# Patient Record
Sex: Female | Born: 1955 | Race: Black or African American | Hispanic: No | Marital: Single | Smoking: Current every day smoker
Health system: Southern US, Community
[De-identification: ages and names within clinical notes are randomized; demographics above are authoritative.]

## PROBLEM LIST (undated history)

## (undated) ENCOUNTER — Emergency Department (HOSPITAL_COMMUNITY): Admission: EM | Payer: Self-pay | Source: Home / Self Care

## (undated) DIAGNOSIS — G4700 Insomnia, unspecified: Secondary | ICD-10-CM

## (undated) DIAGNOSIS — I5032 Chronic diastolic (congestive) heart failure: Secondary | ICD-10-CM

## (undated) DIAGNOSIS — F1410 Cocaine abuse, uncomplicated: Secondary | ICD-10-CM

## (undated) DIAGNOSIS — I639 Cerebral infarction, unspecified: Secondary | ICD-10-CM

## (undated) DIAGNOSIS — I10 Essential (primary) hypertension: Secondary | ICD-10-CM

## (undated) DIAGNOSIS — R0602 Shortness of breath: Secondary | ICD-10-CM

## (undated) DIAGNOSIS — I82621 Acute embolism and thrombosis of deep veins of right upper extremity: Secondary | ICD-10-CM

---

## 1997-11-12 ENCOUNTER — Encounter: Admission: RE | Admit: 1997-11-12 | Discharge: 1997-11-12 | Payer: Self-pay | Admitting: Family Medicine

## 1997-11-26 ENCOUNTER — Encounter: Admission: RE | Admit: 1997-11-26 | Discharge: 1997-11-26 | Payer: Self-pay | Admitting: Family Medicine

## 1997-12-11 ENCOUNTER — Encounter: Admission: RE | Admit: 1997-12-11 | Discharge: 1997-12-11 | Payer: Self-pay | Admitting: Family Medicine

## 1998-01-18 ENCOUNTER — Encounter: Admission: RE | Admit: 1998-01-18 | Discharge: 1998-01-18 | Payer: Self-pay | Admitting: Family Medicine

## 1998-02-03 ENCOUNTER — Encounter: Admission: RE | Admit: 1998-02-03 | Discharge: 1998-02-03 | Payer: Self-pay | Admitting: Family Medicine

## 1998-02-09 ENCOUNTER — Encounter: Admission: RE | Admit: 1998-02-09 | Discharge: 1998-02-09 | Payer: Self-pay | Admitting: Family Medicine

## 1998-03-12 ENCOUNTER — Encounter: Admission: RE | Admit: 1998-03-12 | Discharge: 1998-03-12 | Payer: Self-pay | Admitting: Family Medicine

## 1998-03-19 ENCOUNTER — Encounter: Admission: RE | Admit: 1998-03-19 | Discharge: 1998-03-19 | Payer: Self-pay | Admitting: Family Medicine

## 1998-03-31 ENCOUNTER — Encounter: Admission: RE | Admit: 1998-03-31 | Discharge: 1998-03-31 | Payer: Self-pay | Admitting: Family Medicine

## 1998-05-13 ENCOUNTER — Encounter: Admission: RE | Admit: 1998-05-13 | Discharge: 1998-05-13 | Payer: Self-pay | Admitting: Family Medicine

## 1998-05-26 ENCOUNTER — Encounter: Admission: RE | Admit: 1998-05-26 | Discharge: 1998-05-26 | Payer: Self-pay | Admitting: Family Medicine

## 1998-06-09 ENCOUNTER — Encounter: Admission: RE | Admit: 1998-06-09 | Discharge: 1998-06-09 | Payer: Self-pay | Admitting: Family Medicine

## 1998-08-13 ENCOUNTER — Encounter: Admission: RE | Admit: 1998-08-13 | Discharge: 1998-08-13 | Payer: Self-pay | Admitting: Family Medicine

## 1998-08-23 ENCOUNTER — Inpatient Hospital Stay (HOSPITAL_COMMUNITY): Admission: EM | Admit: 1998-08-23 | Discharge: 1998-08-25 | Payer: Self-pay | Admitting: Emergency Medicine

## 1998-08-23 ENCOUNTER — Encounter: Payer: Self-pay | Admitting: Emergency Medicine

## 1998-09-10 ENCOUNTER — Encounter: Admission: RE | Admit: 1998-09-10 | Discharge: 1998-09-10 | Payer: Self-pay | Admitting: Family Medicine

## 1998-09-17 ENCOUNTER — Encounter: Admission: RE | Admit: 1998-09-17 | Discharge: 1998-09-17 | Payer: Self-pay | Admitting: Family Medicine

## 1998-10-05 ENCOUNTER — Encounter: Admission: RE | Admit: 1998-10-05 | Discharge: 1998-10-05 | Payer: Self-pay | Admitting: Family Medicine

## 1998-12-29 ENCOUNTER — Encounter: Admission: RE | Admit: 1998-12-29 | Discharge: 1998-12-29 | Payer: Self-pay | Admitting: Family Medicine

## 1999-01-19 ENCOUNTER — Encounter: Admission: RE | Admit: 1999-01-19 | Discharge: 1999-01-19 | Payer: Self-pay | Admitting: Family Medicine

## 1999-02-07 ENCOUNTER — Encounter: Admission: RE | Admit: 1999-02-07 | Discharge: 1999-02-07 | Payer: Self-pay | Admitting: Family Medicine

## 1999-02-14 ENCOUNTER — Encounter: Admission: RE | Admit: 1999-02-14 | Discharge: 1999-02-14 | Payer: Self-pay | Admitting: Family Medicine

## 1999-02-26 ENCOUNTER — Emergency Department (HOSPITAL_COMMUNITY): Admission: EM | Admit: 1999-02-26 | Discharge: 1999-02-26 | Payer: Self-pay | Admitting: Emergency Medicine

## 1999-04-15 ENCOUNTER — Encounter: Admission: RE | Admit: 1999-04-15 | Discharge: 1999-04-15 | Payer: Self-pay | Admitting: Family Medicine

## 1999-04-27 ENCOUNTER — Encounter: Admission: RE | Admit: 1999-04-27 | Discharge: 1999-04-27 | Payer: Self-pay | Admitting: Family Medicine

## 1999-10-28 ENCOUNTER — Inpatient Hospital Stay (HOSPITAL_COMMUNITY): Admission: EM | Admit: 1999-10-28 | Discharge: 1999-11-01 | Payer: Self-pay | Admitting: Emergency Medicine

## 1999-10-29 ENCOUNTER — Encounter: Payer: Self-pay | Admitting: Internal Medicine

## 1999-11-01 ENCOUNTER — Encounter: Admission: RE | Admit: 1999-11-01 | Discharge: 1999-11-01 | Payer: Self-pay | Admitting: Obstetrics & Gynecology

## 2000-02-02 ENCOUNTER — Encounter: Payer: Self-pay | Admitting: Emergency Medicine

## 2000-02-02 ENCOUNTER — Emergency Department (HOSPITAL_COMMUNITY): Admission: EM | Admit: 2000-02-02 | Discharge: 2000-02-02 | Payer: Self-pay | Admitting: Emergency Medicine

## 2000-05-03 ENCOUNTER — Encounter: Admission: RE | Admit: 2000-05-03 | Discharge: 2000-05-03 | Payer: Self-pay

## 2000-05-11 ENCOUNTER — Ambulatory Visit (HOSPITAL_COMMUNITY): Admission: RE | Admit: 2000-05-11 | Discharge: 2000-05-11 | Payer: Self-pay | Admitting: Internal Medicine

## 2000-05-11 ENCOUNTER — Encounter: Admission: RE | Admit: 2000-05-11 | Discharge: 2000-05-11 | Payer: Self-pay | Admitting: Internal Medicine

## 2000-05-11 ENCOUNTER — Encounter: Payer: Self-pay | Admitting: Internal Medicine

## 2000-08-15 ENCOUNTER — Emergency Department (HOSPITAL_COMMUNITY): Admission: EM | Admit: 2000-08-15 | Discharge: 2000-08-15 | Payer: Self-pay | Admitting: Emergency Medicine

## 2000-08-20 ENCOUNTER — Emergency Department (HOSPITAL_COMMUNITY): Admission: EM | Admit: 2000-08-20 | Discharge: 2000-08-20 | Payer: Self-pay | Admitting: Emergency Medicine

## 2000-08-22 ENCOUNTER — Encounter: Admission: RE | Admit: 2000-08-22 | Discharge: 2000-08-22 | Payer: Self-pay

## 2001-06-27 ENCOUNTER — Encounter: Admission: RE | Admit: 2001-06-27 | Discharge: 2001-06-27 | Payer: Self-pay | Admitting: Internal Medicine

## 2001-08-19 ENCOUNTER — Encounter: Admission: RE | Admit: 2001-08-19 | Discharge: 2001-08-19 | Payer: Self-pay | Admitting: Internal Medicine

## 2001-12-09 ENCOUNTER — Encounter: Admission: RE | Admit: 2001-12-09 | Discharge: 2001-12-09 | Payer: Self-pay | Admitting: Internal Medicine

## 2001-12-10 ENCOUNTER — Encounter: Admission: RE | Admit: 2001-12-10 | Discharge: 2001-12-10 | Payer: Self-pay | Admitting: Internal Medicine

## 2002-01-27 ENCOUNTER — Encounter: Admission: RE | Admit: 2002-01-27 | Discharge: 2002-01-27 | Payer: Self-pay | Admitting: Internal Medicine

## 2002-03-20 ENCOUNTER — Encounter: Admission: RE | Admit: 2002-03-20 | Discharge: 2002-03-20 | Payer: Self-pay | Admitting: Internal Medicine

## 2002-11-25 ENCOUNTER — Encounter: Admission: RE | Admit: 2002-11-25 | Discharge: 2002-11-25 | Payer: Self-pay | Admitting: Internal Medicine

## 2004-01-04 ENCOUNTER — Emergency Department (HOSPITAL_COMMUNITY): Admission: EM | Admit: 2004-01-04 | Discharge: 2004-01-04 | Payer: Self-pay | Admitting: Emergency Medicine

## 2004-01-14 ENCOUNTER — Encounter: Admission: RE | Admit: 2004-01-14 | Discharge: 2004-01-14 | Payer: Self-pay | Admitting: Internal Medicine

## 2004-08-09 ENCOUNTER — Emergency Department (HOSPITAL_COMMUNITY): Admission: EM | Admit: 2004-08-09 | Discharge: 2004-08-09 | Payer: Self-pay

## 2005-01-24 ENCOUNTER — Ambulatory Visit: Payer: Self-pay | Admitting: Internal Medicine

## 2005-01-25 ENCOUNTER — Encounter (INDEPENDENT_AMBULATORY_CARE_PROVIDER_SITE_OTHER): Payer: Self-pay | Admitting: *Deleted

## 2005-07-10 HISTORY — PX: ANKLE FRACTURE SURGERY: SHX122

## 2005-11-23 ENCOUNTER — Ambulatory Visit: Payer: Self-pay | Admitting: Internal Medicine

## 2005-11-23 ENCOUNTER — Encounter (INDEPENDENT_AMBULATORY_CARE_PROVIDER_SITE_OTHER): Payer: Self-pay | Admitting: *Deleted

## 2006-01-06 ENCOUNTER — Emergency Department (HOSPITAL_COMMUNITY): Admission: EM | Admit: 2006-01-06 | Discharge: 2006-01-06 | Payer: Self-pay | Admitting: Emergency Medicine

## 2006-01-28 ENCOUNTER — Emergency Department (HOSPITAL_COMMUNITY): Admission: EM | Admit: 2006-01-28 | Discharge: 2006-01-28 | Payer: Self-pay | Admitting: Emergency Medicine

## 2006-02-01 ENCOUNTER — Ambulatory Visit (HOSPITAL_COMMUNITY): Admission: RE | Admit: 2006-02-01 | Discharge: 2006-02-01 | Payer: Self-pay | Admitting: Orthopedic Surgery

## 2006-04-26 ENCOUNTER — Encounter (INDEPENDENT_AMBULATORY_CARE_PROVIDER_SITE_OTHER): Payer: Self-pay | Admitting: *Deleted

## 2006-04-26 DIAGNOSIS — F329 Major depressive disorder, single episode, unspecified: Secondary | ICD-10-CM

## 2006-04-26 DIAGNOSIS — I10 Essential (primary) hypertension: Secondary | ICD-10-CM

## 2006-04-26 DIAGNOSIS — M545 Low back pain: Secondary | ICD-10-CM | POA: Insufficient documentation

## 2006-04-26 DIAGNOSIS — F17200 Nicotine dependence, unspecified, uncomplicated: Secondary | ICD-10-CM | POA: Insufficient documentation

## 2006-04-26 DIAGNOSIS — K219 Gastro-esophageal reflux disease without esophagitis: Secondary | ICD-10-CM

## 2006-04-26 DIAGNOSIS — E119 Type 2 diabetes mellitus without complications: Secondary | ICD-10-CM | POA: Insufficient documentation

## 2006-04-26 DIAGNOSIS — F1010 Alcohol abuse, uncomplicated: Secondary | ICD-10-CM

## 2006-07-14 DIAGNOSIS — E669 Obesity, unspecified: Secondary | ICD-10-CM

## 2006-07-14 DIAGNOSIS — K029 Dental caries, unspecified: Secondary | ICD-10-CM | POA: Insufficient documentation

## 2006-10-27 ENCOUNTER — Inpatient Hospital Stay (HOSPITAL_COMMUNITY): Admission: EM | Admit: 2006-10-27 | Discharge: 2006-10-30 | Payer: Self-pay | Admitting: Emergency Medicine

## 2006-10-27 ENCOUNTER — Ambulatory Visit: Payer: Self-pay | Admitting: Internal Medicine

## 2007-02-11 ENCOUNTER — Emergency Department (HOSPITAL_COMMUNITY): Admission: EM | Admit: 2007-02-11 | Discharge: 2007-02-11 | Payer: Self-pay | Admitting: Emergency Medicine

## 2007-02-14 ENCOUNTER — Ambulatory Visit: Payer: Self-pay | Admitting: Infectious Disease

## 2007-04-02 ENCOUNTER — Ambulatory Visit: Payer: Self-pay | Admitting: Hospitalist

## 2007-04-02 LAB — CONVERTED CEMR LAB
Blood Glucose, Fingerstick: 73
Blood Glucose, Fingerstick: 93
Blood Glucose, Home Monitor: 2 mg/dL

## 2007-04-16 ENCOUNTER — Encounter: Payer: Self-pay | Admitting: Licensed Clinical Social Worker

## 2007-07-09 ENCOUNTER — Telehealth (INDEPENDENT_AMBULATORY_CARE_PROVIDER_SITE_OTHER): Payer: Self-pay | Admitting: Pharmacy Technician

## 2007-07-15 ENCOUNTER — Encounter (INDEPENDENT_AMBULATORY_CARE_PROVIDER_SITE_OTHER): Payer: Self-pay | Admitting: Internal Medicine

## 2007-07-15 ENCOUNTER — Ambulatory Visit: Payer: Self-pay | Admitting: Internal Medicine

## 2007-07-15 ENCOUNTER — Ambulatory Visit (HOSPITAL_COMMUNITY): Admission: RE | Admit: 2007-07-15 | Discharge: 2007-07-15 | Payer: Self-pay | Admitting: Internal Medicine

## 2007-07-15 LAB — CONVERTED CEMR LAB
Protein, U semiquant: 100
Specific Gravity, Urine: >=1.03

## 2007-07-16 LAB — CONVERTED CEMR LAB
LDL Cholesterol: 78 mg/dL (ref 0–99)
Potassium: 4.5 meq/L (ref 3.5–5.3)
Sodium: 140 meq/L (ref 135–145)
Total CHOL/HDL Ratio: 3.5
Triglycerides: 114 mg/dL (ref <150)

## 2007-07-17 ENCOUNTER — Telehealth: Payer: Self-pay | Admitting: *Deleted

## 2007-10-14 ENCOUNTER — Other Ambulatory Visit: Payer: Self-pay | Admitting: Emergency Medicine

## 2007-10-14 ENCOUNTER — Ambulatory Visit: Payer: Self-pay | Admitting: Infectious Diseases

## 2007-10-14 ENCOUNTER — Inpatient Hospital Stay (HOSPITAL_COMMUNITY): Admission: AD | Admit: 2007-10-14 | Discharge: 2007-10-20 | Payer: Self-pay | Admitting: Infectious Diseases

## 2007-10-18 ENCOUNTER — Encounter (INDEPENDENT_AMBULATORY_CARE_PROVIDER_SITE_OTHER): Payer: Self-pay | Admitting: Surgery

## 2007-10-21 ENCOUNTER — Encounter (INDEPENDENT_AMBULATORY_CARE_PROVIDER_SITE_OTHER): Payer: Self-pay | Admitting: Internal Medicine

## 2007-11-01 ENCOUNTER — Encounter (INDEPENDENT_AMBULATORY_CARE_PROVIDER_SITE_OTHER): Payer: Self-pay | Admitting: *Deleted

## 2007-11-01 ENCOUNTER — Ambulatory Visit: Payer: Self-pay | Admitting: Infectious Disease

## 2007-11-01 DIAGNOSIS — F1410 Cocaine abuse, uncomplicated: Secondary | ICD-10-CM | POA: Insufficient documentation

## 2007-11-03 LAB — CONVERTED CEMR LAB
Amphetamine Screen, Ur: NEGATIVE
BUN: 10 mg/dL (ref 6–23)
Bacteria, UA: NONE SEEN
Barbiturate Quant, Ur: NEGATIVE
CO2: 23 meq/L (ref 19–32)
Calcium: 9.3 mg/dL (ref 8.4–10.5)
Chloride: 103 meq/L (ref 96–112)
Creatinine, Ser: 0.79 mg/dL (ref 0.40–1.20)
Leukocytes, UA: NEGATIVE
Marijuana Metabolite: NEGATIVE
Opiates: NEGATIVE
Potassium: 4.6 meq/L (ref 3.5–5.3)
Propoxyphene: NEGATIVE
Protein, ur: NEGATIVE mg/dL
RBC / HPF: NONE SEEN (ref <3)
Sodium: 137 meq/L (ref 135–145)
Specific Gravity, Urine: 1.034 — ABNORMAL HIGH (ref 1.005–1.03)
Urine Glucose: >1000 mg/dL — AB
Urobilinogen, UA: 0.2 (ref 0.0–1.0)
WBC, UA: NONE SEEN cells/hpf (ref <3)
pH: 5.5 (ref 5.0–8.0)

## 2007-11-14 ENCOUNTER — Telehealth (INDEPENDENT_AMBULATORY_CARE_PROVIDER_SITE_OTHER): Payer: Self-pay | Admitting: *Deleted

## 2008-03-19 ENCOUNTER — Ambulatory Visit: Payer: Self-pay | Admitting: Infectious Diseases

## 2008-03-19 ENCOUNTER — Inpatient Hospital Stay (HOSPITAL_COMMUNITY): Admission: EM | Admit: 2008-03-19 | Discharge: 2008-03-24 | Payer: Self-pay | Admitting: Emergency Medicine

## 2008-03-26 ENCOUNTER — Telehealth: Payer: Self-pay | Admitting: *Deleted

## 2008-04-07 ENCOUNTER — Encounter (INDEPENDENT_AMBULATORY_CARE_PROVIDER_SITE_OTHER): Payer: Self-pay | Admitting: *Deleted

## 2008-04-17 ENCOUNTER — Ambulatory Visit: Payer: Self-pay | Admitting: Infectious Disease

## 2008-04-17 DIAGNOSIS — A4902 Methicillin resistant Staphylococcus aureus infection, unspecified site: Secondary | ICD-10-CM

## 2008-04-17 DIAGNOSIS — R221 Localized swelling, mass and lump, neck: Secondary | ICD-10-CM

## 2008-04-17 DIAGNOSIS — R220 Localized swelling, mass and lump, head: Secondary | ICD-10-CM | POA: Insufficient documentation

## 2008-06-21 ENCOUNTER — Inpatient Hospital Stay (HOSPITAL_COMMUNITY): Admission: EM | Admit: 2008-06-21 | Discharge: 2008-06-23 | Payer: Self-pay | Admitting: Emergency Medicine

## 2008-06-21 ENCOUNTER — Ambulatory Visit: Payer: Self-pay | Admitting: Internal Medicine

## 2008-07-08 ENCOUNTER — Encounter: Payer: Self-pay | Admitting: *Deleted

## 2008-07-13 ENCOUNTER — Ambulatory Visit: Payer: Self-pay | Admitting: Internal Medicine

## 2008-07-13 DIAGNOSIS — G4700 Insomnia, unspecified: Secondary | ICD-10-CM

## 2008-07-13 LAB — CONVERTED CEMR LAB: Hgb A1c MFr Bld: 10.2 %

## 2009-01-21 ENCOUNTER — Telehealth: Payer: Self-pay | Admitting: *Deleted

## 2009-01-21 ENCOUNTER — Telehealth (INDEPENDENT_AMBULATORY_CARE_PROVIDER_SITE_OTHER): Payer: Self-pay | Admitting: *Deleted

## 2009-02-11 ENCOUNTER — Encounter: Payer: Self-pay | Admitting: Internal Medicine

## 2009-02-11 ENCOUNTER — Ambulatory Visit: Payer: Self-pay | Admitting: Internal Medicine

## 2009-02-11 LAB — CONVERTED CEMR LAB
Blood Glucose, Fingerstick: 474
Hgb A1c MFr Bld: 11 %

## 2009-02-12 ENCOUNTER — Encounter: Payer: Self-pay | Admitting: Internal Medicine

## 2009-02-12 LAB — CONVERTED CEMR LAB
Chloride: 98 meq/L (ref 96–112)
Creatinine, Ser: 0.84 mg/dL (ref 0.40–1.20)
Creatinine, Urine: 43.9 mg/dL
Glucose, Bld: 448 mg/dL — ABNORMAL HIGH (ref 70–99)
MCV: 91.6 fL (ref >=78.0)
Microalb Creat Ratio: 21.4 mg/g (ref 0.0–30.0)
Potassium: 4.6 meq/L (ref 3.5–5.3)
RDW: 14 % (ref 11.5–15.5)

## 2009-02-23 ENCOUNTER — Telehealth (INDEPENDENT_AMBULATORY_CARE_PROVIDER_SITE_OTHER): Payer: Self-pay | Admitting: *Deleted

## 2009-02-24 ENCOUNTER — Telehealth: Payer: Self-pay | Admitting: Licensed Clinical Social Worker

## 2009-02-25 ENCOUNTER — Encounter: Payer: Self-pay | Admitting: Licensed Clinical Social Worker

## 2009-02-25 ENCOUNTER — Ambulatory Visit: Payer: Self-pay | Admitting: Internal Medicine

## 2009-02-25 ENCOUNTER — Telehealth: Payer: Self-pay | Admitting: Internal Medicine

## 2009-02-25 LAB — CONVERTED CEMR LAB: Blood Glucose, Home Monitor: 4 mg/dL

## 2009-03-09 ENCOUNTER — Emergency Department (HOSPITAL_COMMUNITY): Admission: EM | Admit: 2009-03-09 | Discharge: 2009-03-09 | Payer: Self-pay | Admitting: Emergency Medicine

## 2009-03-23 ENCOUNTER — Emergency Department (HOSPITAL_COMMUNITY): Admission: EM | Admit: 2009-03-23 | Discharge: 2009-03-23 | Payer: Self-pay | Admitting: Emergency Medicine

## 2009-04-05 ENCOUNTER — Emergency Department (HOSPITAL_COMMUNITY): Admission: EM | Admit: 2009-04-05 | Discharge: 2009-04-05 | Payer: Self-pay | Admitting: Emergency Medicine

## 2009-04-23 ENCOUNTER — Ambulatory Visit: Payer: Self-pay | Admitting: Gastroenterology

## 2009-07-04 ENCOUNTER — Encounter (INDEPENDENT_AMBULATORY_CARE_PROVIDER_SITE_OTHER): Payer: Self-pay | Admitting: Internal Medicine

## 2009-07-05 ENCOUNTER — Observation Stay (HOSPITAL_COMMUNITY): Admission: EM | Admit: 2009-07-05 | Discharge: 2009-07-05 | Payer: Self-pay | Admitting: Emergency Medicine

## 2009-07-05 ENCOUNTER — Encounter: Payer: Self-pay | Admitting: Internal Medicine

## 2009-07-26 ENCOUNTER — Emergency Department (HOSPITAL_COMMUNITY): Admission: EM | Admit: 2009-07-26 | Discharge: 2009-07-26 | Payer: Self-pay | Admitting: Emergency Medicine

## 2009-08-31 ENCOUNTER — Ambulatory Visit: Payer: Self-pay | Admitting: Internal Medicine

## 2009-08-31 DIAGNOSIS — L02419 Cutaneous abscess of limb, unspecified: Secondary | ICD-10-CM

## 2009-08-31 DIAGNOSIS — L03119 Cellulitis of unspecified part of limb: Secondary | ICD-10-CM

## 2009-08-31 LAB — CONVERTED CEMR LAB
BUN: 7 mg/dL
Chloride: 107 meq/L
LDL Cholesterol: 84 mg/dL (ref 0–99)
Total CHOL/HDL Ratio: 3
Triglycerides: 98 mg/dL (ref <150)
VLDL: 20 mg/dL (ref 0–40)

## 2009-10-01 ENCOUNTER — Ambulatory Visit: Payer: Self-pay | Admitting: Internal Medicine

## 2009-10-01 ENCOUNTER — Inpatient Hospital Stay (HOSPITAL_COMMUNITY): Admission: RE | Admit: 2009-10-01 | Discharge: 2009-10-05 | Payer: Self-pay | Admitting: Internal Medicine

## 2009-10-01 ENCOUNTER — Encounter (INDEPENDENT_AMBULATORY_CARE_PROVIDER_SITE_OTHER): Payer: Self-pay | Admitting: Internal Medicine

## 2009-10-01 ENCOUNTER — Encounter (INDEPENDENT_AMBULATORY_CARE_PROVIDER_SITE_OTHER): Payer: Self-pay | Admitting: Dermatology

## 2009-10-01 DIAGNOSIS — R079 Chest pain, unspecified: Secondary | ICD-10-CM

## 2009-10-04 ENCOUNTER — Encounter (INDEPENDENT_AMBULATORY_CARE_PROVIDER_SITE_OTHER): Payer: Self-pay | Admitting: Dermatology

## 2009-10-04 ENCOUNTER — Encounter: Payer: Self-pay | Admitting: Internal Medicine

## 2009-10-07 ENCOUNTER — Telehealth: Payer: Self-pay | Admitting: Internal Medicine

## 2010-01-28 ENCOUNTER — Telehealth (INDEPENDENT_AMBULATORY_CARE_PROVIDER_SITE_OTHER): Payer: Self-pay | Admitting: *Deleted

## 2010-04-04 ENCOUNTER — Observation Stay (HOSPITAL_COMMUNITY): Admission: EM | Admit: 2010-04-04 | Discharge: 2010-04-04 | Payer: Self-pay | Admitting: Emergency Medicine

## 2010-07-31 ENCOUNTER — Encounter: Payer: Self-pay | Admitting: Internal Medicine

## 2010-08-09 NOTE — Progress Notes (Signed)
  Phone Note Call from Patient   Caller: Patient Details for Reason: Patient call complaining of feeling shaking and with low CBG. Summary of Call: Patient is having an episode of low blood sugar after using her insuline at night. Patient reports she didn't use the whole 35 units as prescribed, because her CBG was just 159 (she only used 20 instead). couple hours later she dropped to 52, noticing and feeling the hypoglyemic event. Patient was instructed to have sweet tea and also some peanut butter and her CBG came back up to 148. Patient was asymptomatic and appointment to be seen in the morning was arranged. Patient needs to keep glucose tablets at home and her insulin adjusted if needed. Patient was advised to call 911 and go to Er in case she experienced any further symptoms or complaints, in order to have proper evaluation and treatment.

## 2010-08-09 NOTE — Miscellaneous (Signed)
Summary: Admission H&P  INTERNAL MEDICINE ADMISSION HISTORY AND PHYSICAL  PCP: Melida Quitter, MD  CC: chest pain  HPI: 55 yo woman with HTN, DMII, tobacco abuse, cocaine abuse who presents with chest pain. She says that for the last few weeks she has had a "tightness" in her chest that comes and goes. It lasts less than 1 minute at a time. It happens at rest and there is nothing in particular that causes the pain, but she thinks it is worse with smoking. It radiates from the center of her chest to under her left breast. It also radiates into the left shoulder. She has a history of reflux, but she thinks that this pain feels different and is located higher anatomically. The pain happens mostly in the afternoon or evening. It is 8-9/10 at maximum intensity. It happens every other day.  She reports that she did have some nausea on the bus today but that it has resolved. She also said that her heart was "fluttering" earlier today but that it has since resolved. She denies vomiting, syncope, shortness of breath.  She does endorse much stress recently as her fiance is hospitalized at the moment after having heart surgery.  She also says that for a week or so she has had some sputum production with flecks of blood in her sputum. She has had no recent illness [URI, PNA, etc]. This has never happened before the last week or so.  The patient also endorses crack cocaine use, most recently the night before last. She says that she does have her episodes of chest pain when she is not using cocaine and that they do not seem to be associated.   She also endorses some lower back pain that precedes her other symptoms and is more chronic. ALLERGIES: NKDA  PAST MEDICAL HISTORY:  DEPRESSION DIABETES MELLITUS, type II    - poorly controlled GERD HYPERTENSION Low back pain Dental caries Tobacco abuse, 4-5 cig/day Obesity ALCOHOL ABUSE    - s/p pancreatitis 06/2008  COCAINE ABUSE    - UDS + 9/09 Hx of R  Bartholin's gland cyst Hx of left thigh/labial abscess with MRSA   MEDICATIONS:  LISINOPRIL 20 MG TABS (LISINOPRIL) Take 1 tablet by mouth once a day ZOLPIDEM TARTRATE 5 MG TABS (ZOLPIDEM TARTRATE) Take 1 tablet by mouth at bedtime PRODIGY BLOOD GLUCOSE TEST  STRP (GLUCOSE BLOOD) use to test blood sugar 2-4x daily before meals and bedtime [BMN] PEN NEEDLES 5/16" 31G X 8 MM MISC (INSULIN PEN NEEDLE) use to inject insulin twice daily PRODIGY TWIST TOP LANCETS 28G  MISC (LANCETS) use to test yor blood sugar 2-4x daily before meals and bedtime [BMN] NOVOLOG MIX 70/30 FLEXPEN 70-30 % SUSP (INSULIN ASPART PROT & ASPART) Inject 45 units subcutaneously every morning and 35 units subcutaneously every evening. [BMN] GLUCOTROL 10 MG TABS (GLIPIZIDE) Take 1 tablet by mouth once a day METFORMIN HCL 500 MG TABS (METFORMIN HCL) Take 1 tablet by mouth two times a day DOXYCYCLINE HYCLATE 100 MG CAPS (DOXYCYCLINE HYCLATE) Take 1 tablet by mouth two times a day for 14 days. ULTRAM 50 MG TABS (TRAMADOL HCL) Take 1 tab every 4-6 hours as needed for pain.   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  Insurance: Medicaid  FAMILY HISTORY Family History: Family History of Alcoholism/Addiction Family History Diabetes 1st degree relative Family History Hypertension Family History of Stroke M 1st degree relative <50  Father died of Stroke Brother had MI at age 55, still alive    ROS: General: Denies fever, chills, weight loss/gain, fatigue, recent travel, sick contacts, diaphoresis, night sweats HEENT: Denies earache, nosebleed, sore  throat CV: Denies  dyspnea, orthopnea, PND, edema, syncope. Endorses chest pain, palpitations as per HPI Resp: Denies dyspnea, DOE. See HPI. GI: Denies vomiting, diarrhea, constipation, hematemesis, hematochezia, melena. Had nausea earlier today that has since resolved. GU: Denies dysuria, urinary frequency, flank pain, hematuria, urinary incontinence, nocturia, urethral discharge, vaginal discharge Derm: Denies rash, hair loss Endocrine: Denies heat intolerance, cold intolerance, polyuria, polydipsia Heme/Lymph: Denies bruising, lymphadenopathy MS: Endorses back pain as per HPI. Neuro/Eyes: Denies weakness, numbness, tremor, headache, diplopia, blurry vision, vision change, hearing loss, dizziness, tinnitus, imbalance, vertigo, neck stiffness Psych: Denies depression, suicidal ideations, homicidal ideations, hallucinations. She does have some anxiety as per HPI.   VITALS: T:  98.4 P:81  BP:142/82  R:  20 O2SAT:96%  ON: RA PHYSICAL EXAM: General:  alert, well-developed, and cooperative to examination.   Head:  normocephalic and atraumatic.   Eyes:  vision grossly intact, pupils equal, pupils round, pupils reactive to light, no injection and anicteric.   Mouth:  pharynx pink and moist, no erythema, and no exudates.   Neck:  supple, full ROM, no thyromegaly, no JVD, and no carotid bruits.   Lungs:  normal respiratory effort, no accessory muscle use, normal breath sounds, no crackles, and no wheezes.  Heart:  normal rate, regular rhythm, no murmur, no gallop, and no rub.   Abdomen:  soft, non-tender, normal bowel sounds, no distention, no guarding, no rebound tenderness, no hepatomegaly, and no splenomegaly.   Msk:  no joint swelling, no joint warmth, and no redness over joints.   Extremities:  No cyanosis, clubbing, edema  Neurologic:  alert & oriented X3, cranial nerves II-XII intact, strength normal in all extremities, sensation intact to light touch, and gait normal.   Skin:  turgor  normal and no rashes.   Psych:  Oriented X3, memory intact for recent and remote, normally interactive, good eye contact, not anxious appearing, and not depressed appearing.   LABS:   D-Dimer, Fibrin Derivatives              0.29              0.00-0.48        ug/mL-FEU   Sodium (NA)                              138               135-145          mEq/L  Potassium (K)                            3.9               3.5-5.1          mEq/L  Chloride  106               96-112           mEq/L  CO2                                      29                19-32            mEq/L  Glucose                                  306        h      70-99            mg/dL  BUN                                      15                6-23             mg/dL  Creatinine                               0.89              0.4-1.2          mg/dL  GFR, Est Non African American            >60               >60              mL/min  GFR, Est African American                >60               >60              mL/min    Oversized comment, see footnote  1  Calcium                                  8.9               8.4-10.5         mg/dL   Troponin I                               <0.01             0.00-0.06        ng/mL   Amphetamins                              SEE NOTE.         NDT    NONE DETECTED  Barbiturates                             SEE NOTE.         NDT    Oversized comment, see  footnote  1  Benzodiazepines                          SEE NOTE.         NDT    NONE DETECTED  Cocaine                                  POSITIVE   a      NDT  Opiates                                  SEE NOTE.         NDT    NONE DETECTED  Tetrahydrocannabinol                     SEE NOTE.         NDT    NONE DETECTED  ASSESSMENT AND PLAN:  Chest Pain: Risk factors for ACS incl: DM, HTN, smoking, subst abuse. Recent h/o hemoptysis is concerning, which gives her a Wells score of 1. D-dimer in clinic was negative.  Given her significant risk factors she needs to come in to rule out ACS and possible further cardiac eval given off and on visit to the hospital for the same complaint.  Will cycle enzymes, EKGs, monitor on tele. Will give daily ASA. Nitro as needed.  DMII: WIll place on SSI sensitive  and scheduled 4U Novolog with meals as well as Lantus 30U daily.  Hemoptysis: This patient says that she has had hemoptysis for a week or so when coughing. She says that she has not really been sick and has not had hemoptysis before. This is concerning. Her chest xray was negative. She may need CT to further eval this.   Tobacco Abuse: Patient was counseled about the advserse health risks of smoking such as heart disease, lung disease, stroke, and cancer. Patient was informed that the clinic is willing and able to assist the patient in smoking cessation through counseling, medications, or smoking cessation aids such as nicotine patches or gums. The patient was encouraged  to contact the clinic anytime smoking cessation help is desired.   Cocaine Abuse: I personally counseled this patient about the adverse health effects of cocaine abuse. She was also counseled in the outpatient clinic and will be continually counseled at each visit. She voices understanding and a desire to quit.  ETOH Abuse: I personally counseled this patient about the adverse health effects of ETOH abuse. She was not aware that drinking beer was the same as drinking liquor. She was educated that these are the same in terms of health risk. I will place her on thiamine, folate, CIWA observation for now.   HTN: Has not been taking meds. Will place on Lisinopril and monitor closely.   Hx Insomnia: Ambien as needed.   VTE PROPH: Heparin.

## 2010-08-09 NOTE — Progress Notes (Signed)
Summary: due to follow up/dmr  Phone Note Outgoing Call   Call placed by: Jamison Neighbor RD,CDE,  January 28, 2010 2:24 PM Summary of Call: called to patient as she is due to see doctor and CDE- she asked if it can be the first week in August, then we were disconnected. Will ask scheduler to return call to schedule for doctor.   Follow-up for Phone Call        Called patient for several days to sch an appointment.  Phone number is disconnected or just stays busy all the time.  If and when the patient calls back an appointment will be made. Follow-up by: Shon Hough,  February 09, 2010 11:43 AM  Additional Follow-up for Phone Call Additional follow up Details #1::        Thank you. Letter mailed today requesting patient call to schedule appointment. Additional Follow-up by: Jamison Neighbor RD,CDE,  February 09, 2010 1:52 PM

## 2010-08-09 NOTE — Miscellaneous (Signed)
Summary: Hospital Discharge Instructions  Hospital Discharge  Date of admission: 10-01-2009  Date of discharge: 10-05-2009  Brief reason for admission/active problems: 55 yo woman admitted with chest pain, ruled out for MI with negative enzymes, EKGs. Had stress test that was negative for any major ischemia. Discharged home after ruleout complete to followup in clinic.   Followup needed: [facility], 10-15-2009 3:30 PM, Dr. Doreen Beam  At this appointment please assess symptoms of chest pain and also assess for ongoing tobacco, cocaine, and ETOH use. No specific labs are needed.   The medication and problem lists have been updated.  Please see the dictated discharge summary for details.   Current Meds:  LISINOPRIL 20 MG TABS (LISINOPRIL) Take 1 tablet by mouth once a day ZOLPIDEM TARTRATE 5 MG TABS (ZOLPIDEM TARTRATE) Take 1 tablet by mouth at bedtime PRODIGY BLOOD GLUCOSE TEST  STRP (GLUCOSE BLOOD) use to test blood sugar 2-4x daily before meals and bedtime [BMN] PEN NEEDLES 5/16" 31G X 8 MM MISC (INSULIN PEN NEEDLE) use to inject insulin twice daily PRODIGY TWIST TOP LANCETS 28G  MISC (LANCETS) use to test yor blood sugar 2-4x daily before meals and bedtime [BMN] NOVOLOG MIX 70/30 FLEXPEN 70-30 % SUSP (INSULIN ASPART PROT & ASPART) Inject 45 units subcutaneously every morning and 35 units subcutaneously every evening. [BMN] GLUCOTROL 10 MG TABS (GLIPIZIDE) Take 1 tablet by mouth once a day METFORMIN HCL 500 MG TABS (METFORMIN HCL) Take 1 tablet by mouth two times a day ULTRAM 50 MG TABS (TRAMADOL HCL) Take 1 tab every 4-6 hours as needed for pain.

## 2010-08-09 NOTE — Assessment & Plan Note (Signed)
Summary: EST-CK/FU/MEDS/CFB   Vital Signs:  Patient profile:   55 year old female Height:      69 inches (175.26 cm) Weight:      219.2 pounds (99.64 kg) BMI:     32.49 Temp:     97.0 degrees F oral Pulse rate:   80 / minute BP sitting:   150 / 86  (right arm)  Vitals Entered By: Chinita Pester RN (August 31, 2009 9:37 AM) CC: Check-up. Med.refills. Right foot toenail needs to bechecked. Sore on left leg . Scalp abscess is healing. Is Patient Diabetic? Yes Did you bring your meter with you today? No Pain Assessment Patient in pain? yes     Location: lower back Intensity: 7 Type: sharp Onset of pain  Intermittent Nutritional Status BMI of > 30 = obese CBG Result 295  Have you ever been in a relationship where you felt threatened, hurt or afraid?No   Does patient need assistance? Functional Status Self care Ambulation Normal   Primary Care Provider:  Olene Craven MD  CC:  Check-up. Med.refills. Right foot toenail needs to bechecked. Sore on left leg . Scalp abscess is healing.Marland Kitchen  History of Present Illness: Ms. Killough is a 55 y/o woman with DM, HTN and alcohol abuse who presents to the opc for general check up and medication refills.  1) DM Type II - Pt reports she is not taking her insulin or glipizide as prescribed because she has been spending a lot of time at her sister-in-law's house while her husband has been in the hospital. She reports her husband recently had a MI and was hospitalized and since her sister-in-law lives near the hospital, she has been staying there and thus forgets to take her medications with her.   2) HTN - Reports she has been out of medications and does not take them everyday due to recent stress of her husband's condition. She denies headache, chest pain, shortness of breath, edema, or cough.   3) Lesion - over left calf. First noticed it 1 week ago. It started as a small bump. It has gotten bigger since then, now tender, draining  yellow/white puss. She reports it is healing now.     Depression History:      The patient denies a depressed mood most of the day and a diminished interest in her usual daily activities.         Preventive Screening-Counseling & Management  Alcohol-Tobacco     Alcohol drinks/day: every other day     Alcohol type: beer     Smoking Status: current     Smoking Cessation Counseling: no     Packs/Day: 1-2  cigs per day  Caffeine-Diet-Exercise     Does Patient Exercise: yes     Type of exercise: WALKING     Times/week: 5-7  -  Date:  08/31/2009    BUN: 7    Creatinine: 0.65    Sodium: 139    Potassium: 4.6    Chloride: 107    CO2 Total: 27  Current Medications (verified): 1)  Lisinopril 20 Mg Tabs (Lisinopril) .... Take 1 Tablet By Mouth Once A Day 2)  Zolpidem Tartrate 5 Mg Tabs (Zolpidem Tartrate) .... Take 1 Tablet By Mouth At Bedtime 3)  Prodigy Blood Glucose Test  Strp (Glucose Blood) .... Use To Test Blood Sugar 2-4x Daily Before Meals and Bedtime 4)  Pen Needles 5/16" 31g X 8 Mm Misc (Insulin Pen Needle) .... Use To Inject Insulin Twice  Daily 5)  Prodigy Twist Top Lancets 28g  Misc (Lancets) .... Use To Test Yor Blood Sugar 2-4x Daily Before Meals and Bedtime 6)  Novolog Mix 70/30 Flexpen 70-30 % Susp (Insulin Aspart Prot & Aspart) .... Inject 45 Units Subcutaneously Every Morning and 35 Units Subcutaneously Every Evening. 7)  Glucotrol 10 Mg Tabs (Glipizide) .... Take 1 Tablet By Mouth Once A Day  Allergies (verified): No Known Drug Allergies  Past History:  Past Medical History: Last updated: 07/13/2008 DEPRESSION DIABETES MELLITUS, type II    - poorly controlled GERD HYPERTENSION Low back pain Dental caries Tobacco abuse, 4-5 cig/day Obesity ALCOHOL ABUSE    - s/p pancreatitis 06/2008 COCAINE ABUSE    - UDS + 9/09 Hx of R Bartholin's gland cyst Hx of left thigh/labial abscess with MRSA  Past Surgical History: Last updated: 02/14/2007 Incision and  drainage of R Bartholin's gland cyst 10/1999 Trimalleolar fx (L ankle) s/p repair  Family History: Last updated: 02/11/2009 Family History of Alcoholism/Addiction Family History Diabetes 1st degree relative Family History Hypertension Family History of Stroke M 1st degree relative <50  Social History: Last updated: 02/11/2009 Pt's children live in [place] Pt lives in [place] husband  Current Smoker - 3-4 cig per day Alcohol use-yes drinks 2 beers per day  Risk Factors: Alcohol Use: every other day (08/31/2009) Exercise: yes (08/31/2009)  Risk Factors: Smoking Status: current (08/31/2009) Packs/Day: 1-2  cigs per day (08/31/2009)  Social History: Packs/Day:  1-2  cigs per day  Review of Systems General:  Denies fatigue, fever, and weakness. CV:  Complains of chest pain or discomfort; denies difficulty breathing at night, difficulty breathing while lying down, shortness of breath with exertion, and swelling of feet. Resp:  Complains of cough and sputum productive. GI:  Denies abdominal pain, change in bowel habits, nausea, and vomiting. GU:  Denies urinary frequency. Endo:  Complains of excessive thirst.  Physical Exam  General:  alert and well-developed.   Head:  normocephalic and atraumatic.   Eyes:  vision grossly intact, pupils equal, pupils round, and pupils reactive to light.   Neck:  supple, full ROM, and no masses.   Lungs:  normal respiratory effort, normal breath sounds, no crackles, and no wheezes.   Heart:  normal rate, regular rhythm, no murmur, no gallop, no rub, and no JVD.   Abdomen:  soft, non-tender, normal bowel sounds, no distention, and no masses.   Msk:  normal ROM.   Pulses:  R radial normal, R dorsalis pedis normal, L radial normal, and L dorsalis pedis normal.   Extremities:  no LE edema noted  Neurologic:  alert & oriented X3, cranial nerves II-XII intact, sensation intact to light touch, and sensation intact to pinprick.   Skin:  3-4 cm  lesion over left calf, draining clear fluid, tender on palpation, appears to be healing, no puss or blood from site   Diabetes Management Exam:    Foot Exam (with socks and/or shoes not present):       Sensory-Pinprick/Light touch:          Left medial foot (L-4): normal          Left dorsal foot (L-5): normal          Left lateral foot (S-1): normal          Right medial foot (L-4): normal          Right dorsal foot (L-5): normal          Right lateral foot (  S-1): normal       Sensory-Monofilament:          Left foot: normal          Right foot: normal       Inspection:          Left foot: normal          Right foot: normal       Nails:          Left foot: normal          Right foot: normal   Impression & Recommendations:  Problem # 1:  DIABETES MELLITUS, TYPE II (ICD-250.00) Assessment Deteriorated DM has deteriorated due to noncompliance with medications. Patient would benefit from both insulin and metformin to give both insulin and decrease gluconeogenesis and it appears that patient has not been on Metformin in the past. Will start Metformin 500 mg two times a day and check renal function at next office visit in 1 month.   Her updated medication list for this problem includes:    Lisinopril 20 Mg Tabs (Lisinopril) .Marland Kitchen... Take 1 tablet by mouth once a day    Novolog Mix 70/30 Flexpen 70-30 % Susp (Insulin aspart prot & aspart) ..... Inject 45 units subcutaneously every morning and 35 units subcutaneously every evening.    Glucotrol 10 Mg Tabs (Glipizide) .Marland Kitchen... Take 1 tablet by mouth once a day    Metformin Hcl 500 Mg Tabs (Metformin hcl) .Marland Kitchen... Take 1 tablet by mouth two times a day  Orders: T- Capillary Blood Glucose (16109) T-Hgb A1C (in-house) ([id number]) T-Lipid Profile [phone])  Labs Reviewed: Creat: 0.84 (02/12/2009)   Microalbumin: 0.92 (01/25/2005) Reviewed HgBA1c results: 12.5 (08/31/2009)  11.0 (02/11/2009)  Problem # 2:  HYPERTENSION  (ICD-401.9) Assessment: Deteriorated Blood pressure elevated due to medication non-compliance. Patient is well controlled when taking Lisinopril. Will restart lisinopril and have patient follow up in 1 month.   Her updated medication list for this problem includes:    Lisinopril 20 Mg Tabs (Lisinopril) .Marland Kitchen... Take 1 tablet by mouth once a day  BP today: 150/86 Prior BP: 123/73 (02/11/2009)  Labs Reviewed: K+: 4.6 (02/12/2009) Creat: : 0.84 (02/12/2009)   Chol: 141 (07/15/2007)   HDL: 40 (07/15/2007)   LDL: 70  --  06/21/08 (07/08/2008)   TG: 114 (07/15/2007)  Problem # 3:  CELLULITIS AND ABSCESS OF LEG EXCEPT FOOT (ICD-682.6) Assessment: New Lesion over left calf, is tender, however is no longer draining and looks like it is healing now, therefore will not lance. Given hx of MRSA and poorly controlled DM, will give patient a 2 week course of doxycycline and have patient follow up. Patient was advised that if the lesion gets worse, and develops fevers, etc then patient should return to clinic sooner.   Her updated medication list for this problem includes:    Doxycycline Hyclate 100 Mg Caps (Doxycycline hyclate) .Marland Kitchen... Take 1 tablet by mouth two times a day for 14 days.  Problem # 4:  LOW BACK PAIN (ICD-724.2) Assessment: Deteriorated No recent imaging studies. Suspect lower back pain is 2/2 degenerative changes. No fevers, chills, or indication of epidural abscess. Does not radiate and no neurological deficits. Will try ultram for pain management as patient reports Ibuprofen and Tyelenol do not help pain.   Her updated medication list for this problem includes:    Ultram 50 Mg Tabs (Tramadol hcl) .Marland Kitchen... Take 1 tab every 4-6 hours as needed for pain.  Problem # 5:  INSOMNIA (ICD-780.52)  Assessment: Comment Only Patient reports improved restful state with Ambien, thus will continue for now. Advised patient that her difficulty sleeping is likely due to her husband's medical condition and  stress. Also encouraged good sleep hygene.   Her updated medication list for this problem includes:    Zolpidem Tartrate 5 Mg Tabs (Zolpidem tartrate) .Marland Kitchen... Take 1 tablet by mouth at bedtime  Complete Medication List: 1)  Lisinopril 20 Mg Tabs (Lisinopril) .... Take 1 tablet by mouth once a day 2)  Zolpidem Tartrate 5 Mg Tabs (Zolpidem tartrate) .... Take 1 tablet by mouth at bedtime 3)  Prodigy Blood Glucose Test Strp (Glucose blood) .... Use to test blood sugar 2-4x daily before meals and bedtime 4)  Pen Needles 5/16" 31g X 8 Mm Misc (Insulin pen needle) .... Use to inject insulin twice daily 5)  Prodigy Twist Top Lancets 28g Misc (Lancets) .... Use to test yor blood sugar 2-4x daily before meals and bedtime 6)  Novolog Mix 70/30 Flexpen 70-30 % Susp (Insulin aspart prot & aspart) .... Inject 45 units subcutaneously every morning and 35 units subcutaneously every evening. 7)  Glucotrol 10 Mg Tabs (Glipizide) .... Take 1 tablet by mouth once a day 8)  Metformin Hcl 500 Mg Tabs (Metformin hcl) .... Take 1 tablet by mouth two times a day 9)  Doxycycline Hyclate 100 Mg Caps (Doxycycline hyclate) .... Take 1 tablet by mouth two times a day for 14 days. 10)  Ultram 50 Mg Tabs (Tramadol hcl) .... Take 1 tab every 4-6 hours as needed for pain.  Other Orders: Admin 1st Vaccine (16109) Flu Vaccine 24yrs + (60454)  Patient Instructions: 1)  Please schedule a follow-up appointment in 1 month. Prescriptions: ULTRAM 50 MG TABS (TRAMADOL HCL) Take 1 tab every 4-6 hours as needed for pain.  #60 x 0   Entered and Authorized by:   Melida Quitter MD   Signed by:   Melida Quitter MD on 08/31/2009   Method used:   Print then Give to Patient   RxID:   [id number] DOXYCYCLINE HYCLATE 100 MG CAPS (DOXYCYCLINE HYCLATE) Take 1 tablet by mouth two times a day for 14 days.  #28 x 0   Entered and Authorized by:   Melida Quitter MD   Signed by:   Melida Quitter MD on 08/31/2009   Method used:   Print then Give  to Patient   RxID:   [id number] METFORMIN HCL 500 MG TABS (METFORMIN HCL) Take 1 tablet by mouth two times a day  #60 x 6   Entered and Authorized by:   Melida Quitter MD   Signed by:   Melida Quitter MD on 08/31/2009   Method used:   Print then Give to Patient   RxID:   [id number] GLUCOTROL 10 MG TABS (GLIPIZIDE) Take 1 tablet by mouth once a day  #30 x 6   Entered and Authorized by:   Melida Quitter MD   Signed by:   Melida Quitter MD on 08/31/2009   Method used:   Print then Give to Patient   RxID:   [id number] NOVOLOG MIX 70/30 FLEXPEN 70-30 % SUSP (INSULIN ASPART PROT & ASPART) Inject 45 units subcutaneously every morning and 35 units subcutaneously every evening. Brand medically necessary #2 boxes x 5   Entered and Authorized by:   Melida Quitter MD   Signed by:   Melida Quitter MD on 08/31/2009   Method used:   Print then Give to Patient   RxID:  [id number] ZOLPIDEM TARTRATE 5 MG TABS (ZOLPIDEM TARTRATE) Take 1 tablet by mouth at bedtime  #30 x 0   Entered and Authorized by:   Melida Quitter MD   Signed by:   Melida Quitter MD on 08/31/2009   Method used:   Print then Give to Patient   RxID:   [id number] LISINOPRIL 20 MG TABS (LISINOPRIL) Take 1 tablet by mouth once a day  #30 x 11   Entered and Authorized by:   Melida Quitter MD   Signed by:   Melida Quitter MD on 08/31/2009   Method used:   Print then Give to Patient   RxID:   [id number]   Prevention & Chronic Care Immunizations   Influenza vaccine: Fluvax 3+  (08/31/2009)    Tetanus booster: Not documented   Td booster deferral: Refused  (02/11/2009)    Pneumococcal vaccine: Historical  (05/11/2000)  Colorectal Screening   Hemoccult: Not documented    Colonoscopy: Not documented   Colonoscopy action/deferral: GI referral  (02/11/2009)  Other Screening   Pap smear: Not documented   Pap smear action/deferral: Refused  (08/31/2009)    Mammogram: Not documented   Mammogram  action/deferral: Refused  (08/31/2009)   Smoking status: current  (08/31/2009)   Smoking cessation counseling: no  (08/31/2009)  Diabetes Mellitus   HgbA1C: 12.5  (08/31/2009)    Eye exam: Not documented    Foot exam: yes  (08/31/2009)   High risk foot: Yes  (04/02/2007)   Foot care education: Not documented    Urine microalbumin/creatinine ratio: 21.4  (02/12/2009)   Urine microalbumin action/deferral: Ordered    Diabetes flowsheet reviewed?: Yes   Progress toward A1C goal: Deteriorated  Lipids   Total Cholesterol: 141  (07/15/2007)   Lipid panel action/deferral: Lipid Panel ordered   LDL: 70  --  06/21/08  (07/08/2008)   LDL Direct: Not documented   HDL: 40  (07/15/2007)   Triglycerides: 114  (07/15/2007)  Hypertension   Last Blood Pressure: 150 / 86  (08/31/2009)   Serum creatinine: 0.65  (08/31/2009)   BMP action: Ordered   Serum potassium 4.6  (08/31/2009)    Hypertension flowsheet reviewed?: Yes   Progress toward BP goal: Deteriorated  Self-Management Support :   Personal Goals (by the next clinic visit) :     Personal A1C goal: 8  (08/31/2009)     Personal blood pressure goal: 130/80  (08/31/2009)     Personal LDL goal: 70  (08/31/2009)    Patient will work on the following items until the next clinic visit to reach self-care goals:     Medications and monitoring: take my medicines every day, examine my feet every day  (08/31/2009)     Eating: drink diet soda or water instead of juice or soda, eat more vegetables  (08/31/2009)     Activity: take a 30 minute walk every day  (08/31/2009)    Diabetes self-management support: Copy of home glucose meter record, CBG self-monitoring log, Education handout, Resources for patients handout, Written self-care plan  (08/31/2009)   Diabetes care plan printed   Diabetes education handout printed   Last diabetes self-management training by diabetes educator: 02/25/2009    Hypertension self-management support: Copy of  home glucose meter record, CBG self-monitoring log, Education handout, Resources for patients handout, Written self-care plan  (08/31/2009)   Hypertension self-care plan printed.   Hypertension education handout printed      Resource handout printed.   Nursing Instructions: Give Flu vaccine today  Process Orders Check Orders Results:     [facility]: ABN not required for this insurance Tests Sent for requisitioning (August 31, 2009 1:56 PM):     08/31/2009: [facility] -- T-Lipid Profile [id number] (signed)    Laboratory Results   Blood Tests   Date/Time Received: August 31, 2009 9:55 AM  Date/Time Reported: Burke Keels  August 31, 2009 9:55 AM   HGBA1C: 12.5%   (Normal Range: Non-Diabetic - 3-6%   Control Diabetic - 6-8%) CBG Random:: 295mg /dL      Process Orders Check Orders Results:     [facility]: ABN not required for this insurance Tests Sent for requisitioning (August 31, 2009 1:56 PM):     08/31/2009: [facility] -- T-Lipid Profile [phone] (signed)  Flu Vaccine Consent Questions     Do you have a history of severe allergic reactions to this vaccine? no    Any prior history of allergic reactions to egg and/or gelatin? no    Do you have a sensitivity to the preservative Thimersol? no    Do you have a past history of Guillan-Barre Syndrome? no    Do you currently have an acute febrile illness? no    Have you ever had a severe reaction to latex? no    Vaccine information given and explained to patient? yes    Are you currently pregnant? no    Lot [id number]   Exp Date:10/07/2009   Manufacturer: [employer]    Site Given  Left Deltoid [email or web address] Chinita Pester RN  August 31, 2009 10:46 AM

## 2010-08-09 NOTE — Assessment & Plan Note (Signed)
Summary: F/U/SIDHU/VS   Vital Signs:  Patient profile:   55 year old female Height:      69 inches (175.26 cm) Weight:      220.9 pounds (100.41 kg) BMI:     32.74 Temp:     98.2 degrees F (36.78 degrees C) oral Pulse rate:   84 / minute BP sitting:   151 / 91  (right arm) Cuff size:   regular  Vitals Entered By: Theotis Barrio NT II (October 01, 2009 2:32 PM) CC: 1 MONTH FOLLOW UP APPT / MEDICATION REFILL- FOR PAIN /  REFILL ON INSULIN,  Is Patient Diabetic? Yes Did you bring your meter with you today? No Pain Assessment Patient in pain? yes     Location: back Intensity:       5 Type: sharp Onset of pain  OFF /  ONE FOR ABOUT A WEEK Nutritional Status BMI of > 30 = obese  Have you ever been in a relationship where you felt threatened, hurt or afraid?No   Does patient need assistance? Ambulation Normal Comments MEDICATION REFILL / REFILL ON INSULIN AND SOMETHING FOR BACK PAIN   Primary Care Provider:  Olene Craven MD  CC:  1 MONTH FOLLOW UP APPT / MEDICATION REFILL- FOR PAIN /  REFILL ON INSULIN and .  History of Present Illness: Ms. Crabtree is in here for follow up on her chronic problems, but has a new complaint of chest pain:  -- off and on chest pain lasting up to 30 seconds for the past week or so, left side chest pain, non radiating. She says she might have had the same kind of pain for much longer than that and has had to come to the ER for the same. Last did cocaine about a week ago. Having cough off and on and she brought up little bit of blood in her sputum last week.  -- CBG looking good; forgot to bring the meter; 68 - 300s.  -- did not take medicines, hosband admitted in the hospital for chest pain, stressed because of that.     Depression History:      The patient denies a depressed mood most of the day and a diminished interest in her usual daily activities.         Preventive Screening-Counseling & Management  Alcohol-Tobacco     Alcohol  drinks/day: every other day     Alcohol type: beer     Smoking Status: current     Smoking Cessation Counseling: no     Packs/Day: 1-2  cigs per day  Caffeine-Diet-Exercise     Does Patient Exercise: yes     Type of exercise: WALKING     Times/week: 5-7  Current Medications (verified): 1)  Lisinopril 20 Mg Tabs (Lisinopril) .... Take 1 Tablet By Mouth Once A Day 2)  Zolpidem Tartrate 5 Mg Tabs (Zolpidem Tartrate) .... Take 1 Tablet By Mouth At Bedtime 3)  Prodigy Blood Glucose Test  Strp (Glucose Blood) .... Use To Test Blood Sugar 2-4x Daily Before Meals and Bedtime 4)  Pen Needles 5/16" 31g X 8 Mm Misc (Insulin Pen Needle) .... Use To Inject Insulin Twice Daily 5)  Prodigy Twist Top Lancets 28g  Misc (Lancets) .... Use To Test Yor Blood Sugar 2-4x Daily Before Meals and Bedtime 6)  Novolog Mix 70/30 Flexpen 70-30 % Susp (Insulin Aspart Prot & Aspart) .... Inject 45 Units Subcutaneously Every Morning and 35 Units Subcutaneously Every Evening. 7)  Glucotrol 10  Mg Tabs (Glipizide) .... Take 1 Tablet By Mouth Once A Day 8)  Metformin Hcl 500 Mg Tabs (Metformin Hcl) .... Take 1 Tablet By Mouth Two Times A Day 9)  Doxycycline Hyclate 100 Mg Caps (Doxycycline Hyclate) .... Take 1 Tablet By Mouth Two Times A Day For 14 Days. 10)  Ultram 50 Mg Tabs (Tramadol Hcl) .... Take 1 Tab Every 4-6 Hours As Needed For Pain.  Allergies (verified): No Known Drug Allergies  Past History:  Past Medical History: Last updated: 07/13/2008 DEPRESSION DIABETES MELLITUS, type II    - poorly controlled GERD HYPERTENSION Low back pain Dental caries Tobacco abuse, 4-5 cig/day Obesity ALCOHOL ABUSE    - s/p pancreatitis 06/2008 COCAINE ABUSE    - UDS + 9/09 Hx of R Bartholin's gland cyst Hx of left thigh/labial abscess with MRSA  Past Surgical History: Last updated: 02/14/2007 Incision and drainage of R Bartholin's gland cyst 10/1999 Trimalleolar fx (L ankle) s/p repair  Family History: Last  updated: 02/11/2009 Family History of Alcoholism/Addiction Family History Diabetes 1st degree relative Family History Hypertension Family History of Stroke M 1st degree relative <50  Social History: Last updated: 02/11/2009 Pt's children live in [place] Pt lives in [place] husband  Current Smoker - 3-4 cig per day Alcohol use-yes drinks 2 beers per day  Risk Factors: Alcohol Use: every other day (10/01/2009) Exercise: yes (10/01/2009)  Risk Factors: Smoking Status: current (10/01/2009) Packs/Day: 1-2  cigs per day (10/01/2009)  Review of Systems       as per HPI  Physical Exam  General:  alert.  mildly anxious Head:  normocephalic and atraumatic.   Eyes:  no icterus, no pallor Mouth:  pharynx pink and moist.   Neck:  supple.  no JVD Lungs:  normal respiratory effort and normal breath sounds.   Heart:  normal rate, regular rhythm, no murmur, no gallop, and no rub.   Abdomen:  soft and non-tender.   Pulses:  normal peripheral pulses  Extremities:  no cyanosis, clubbing or edema  Neurologic:  non focal Psych:  normally interactive.     Impression & Recommendations:  Problem # 1:  CHEST PAIN (ICD-786.50) Risk factors for ACS incl: DM, HTN, smoking, subst abuse. Recent h/o hemoptysis is concerning, which gives her a Wells score of 1. I discussed her case with Dr. Rogelia Boga, given her significant risk factors she needs to come in to rule out ACS and possible further cardiac eval given off and on visit to the hospital for the same complaint.   Problem # 2:  HYPERTENSION (ICD-401.9) 151/91 today. Not taking her meds. Counselled patient on the need to take her meds without fail. Patient voices understanding.  Her updated medication list for this problem includes:    Lisinopril 20 Mg Tabs (Lisinopril) .Marland Kitchen... Take 1 tablet by mouth once a day    Her updated medication list for this problem includes:    Lisinopril 20 Mg Tabs (Lisinopril) .Marland Kitchen... Take 1 tablet by mouth once a  day  Orders: T-Basic Metabolic Panel [phone])  Problem # 3:  DIABETES MELLITUS, TYPE II (ICD-250.00) A1c noted to be high, some changes made to the insulin regimen; will need to review her CBG numbers on glucometer before further changes.   Data reviewed: A1c: 12.5 (08/31/2009 9:25:31 AM)  MICROALB/CR: 21.4 (02/12/2009 3:35:00 AM)  LDL: 84 (08/31/2009 8:24:00 PM) at goal EYE: will make referral FOOT: yes (08/31/2009 9:25:31 AM), will make podiatry referral  Problem # 4:  COCAINE ABUSE (ICD-305.60) Counselled  on substance abuse.   Problem # 5:  TOBACCO ABUSE (ICD-305.1) Counselled, will continue to reinforce on subsequent visits .  Problem # 6:  LOW BACK PAIN (ICD-724.2) Good control with ultram. Patient wants meds refilled; request faxed to [facility].  Her updated medication list for this problem includes:    Ultram 50 Mg Tabs (Tramadol hcl) .Marland Kitchen... Take 1 tab every 4-6 hours as needed for pain.  Complete Medication List: 1)  Lisinopril 20 Mg Tabs (Lisinopril) .... Take 1 tablet by mouth once a day 2)  Zolpidem Tartrate 5 Mg Tabs (Zolpidem tartrate) .... Take 1 tablet by mouth at bedtime 3)  Prodigy Blood Glucose Test Strp (Glucose blood) .... Use to test blood sugar 2-4x daily before meals and bedtime 4)  Pen Needles 5/16" 31g X 8 Mm Misc (Insulin pen needle) .... Use to inject insulin twice daily 5)  Prodigy Twist Top Lancets 28g Misc (Lancets) .... Use to test yor blood sugar 2-4x daily before meals and bedtime 6)  Novolog Mix 70/30 Flexpen 70-30 % Susp (Insulin aspart prot & aspart) .... Inject 45 units subcutaneously every morning and 35 units subcutaneously every evening. 7)  Glucotrol 10 Mg Tabs (Glipizide) .... Take 1 tablet by mouth once a day 8)  Metformin Hcl 500 Mg Tabs (Metformin hcl) .... Take 1 tablet by mouth two times a day 9)  Doxycycline Hyclate 100 Mg Caps (Doxycycline hyclate) .... Take 1 tablet by mouth two times a day for 14 days. 10)  Ultram 50 Mg Tabs  (Tramadol hcl) .... Take 1 tab every 4-6 hours as needed for pain.  Other Orders: Ophthalmology Referral (Ophthalmology) Gastroenterology Referral (GI) Podiatry Referral (Podiatry) 12 Lead EKG (12 Lead EKG) T-D-[facility]) [phone]) T-Troponin I [phone]) T-Urine Drug Screen [facility]) ([id number]) CXR- 2view (CXR) Prescriptions: ULTRAM 50 MG TABS (TRAMADOL HCL) Take 1 tab every 4-6 hours as needed for pain.  #60 x 0   Entered and Authorized by:   Zara Council MD   Signed by:   Zara Council MD on 10/01/2009   Method used:   Faxed to ...       [facility] (retail)       [address]       [place], [place]  [address]       Ph: [phone]       Fax: [phone]   RxID:   [id number]   Prevention & Chronic Care Immunizations   Influenza vaccine: Fluvax 3+  (08/31/2009)    Tetanus booster: Not documented   Td booster deferral: Refused  (02/11/2009)    Pneumococcal vaccine: Historical  (05/11/2000)  Colorectal Screening   Hemoccult: Not documented   Hemoccult action/deferral: Deferred  (10/01/2009)    Colonoscopy: Not documented   Colonoscopy action/deferral: GI referral  (10/01/2009)  Other Screening   Pap smear: Not documented   Pap smear action/deferral: Deferred  (10/01/2009)    Mammogram: Not documented   Mammogram action/deferral: Ordered  (10/01/2009)   Smoking status: current  (10/01/2009)   Smoking cessation counseling: no  (10/01/2009)  Diabetes Mellitus   HgbA1C: 12.5  (08/31/2009)    Eye exam: Not documented    Foot exam: yes  (08/31/2009)   High risk foot: Yes  (04/02/2007)   Foot care education: Not documented    Urine microalbumin/creatinine ratio: 21.4  (02/12/2009)   Urine microalbumin action/deferral: Ordered    Diabetes flowsheet reviewed?: Yes   Progress toward  A1C goal: Unchanged  Lipids   Total Cholesterol: 156  (08/31/2009)   Lipid panel action/deferral: Lipid Panel ordered    LDL: 84  (08/31/2009)   LDL Direct: Not documented   HDL: 52  (08/31/2009)   Triglycerides: 98  (08/31/2009)  Hypertension   Last Blood Pressure: 151 / 91  (10/01/2009)   Serum creatinine: 0.65  (08/31/2009)   BMP action: Ordered   Serum potassium 4.6  (08/31/2009)    Hypertension flowsheet reviewed?: Yes   Progress toward BP goal: Unchanged  Self-Management Support :   Personal Goals (by the next clinic visit) :     Personal A1C goal: 8  (08/31/2009)     Personal blood pressure goal: 130/80  (08/31/2009)     Personal LDL goal: 70  (08/31/2009)    Diabetes self-management support: Copy of home glucose meter record, CBG self-monitoring log, Education handout, Resources for patients handout, Written self-care plan  (08/31/2009)   Last diabetes self-management training by diabetes educator: 02/25/2009    Hypertension self-management support: Copy of home glucose meter record, CBG self-monitoring log, Education handout, Resources for patients handout, Written self-care plan  (08/31/2009)   Nursing Instructions: GI referral for screening colonoscopy (see order) GI referral for screening colonoscopy (see order)   Process Orders Check Orders Results:     [facility]: ABN not required for this insurance Tests Sent for requisitioning (October 01, 2009 4:00 PM):     10/01/2009: [facility] -- T-Basic Metabolic Panel [phone] (signed)     10/01/2009: [facility] -- T-Troponin I [phone] (signed)   Appended Document: F/U/SIDHU/VS nsl start at 1535 #22, 1 stick L wrist, tol well, flushed w/ ns well, sterile drsg applied

## 2010-08-13 ENCOUNTER — Other Ambulatory Visit: Payer: Self-pay | Admitting: *Deleted

## 2010-09-16 ENCOUNTER — Other Ambulatory Visit: Payer: Self-pay | Admitting: Internal Medicine

## 2010-09-22 LAB — BASIC METABOLIC PANEL
BUN: 9 mg/dL (ref 6–23)
CO2: 26 mEq/L (ref 19–32)
Calcium: 9.1 mg/dL (ref 8.4–10.5)
Chloride: 100 mEq/L (ref 96–112)
GFR calc Af Amer: >60 mL/min (ref >60)
GFR calc non Af Amer: >60 mL/min (ref >60)
Glucose, Bld: 493 mg/dL — ABNORMAL HIGH (ref 70–99)
Sodium: 133 mEq/L — ABNORMAL LOW (ref 135–145)

## 2010-09-22 LAB — CBC
HCT: 44.9 % (ref 36.0–46.0)
Hemoglobin: 15.5 g/dL — ABNORMAL HIGH (ref 12.0–15.0)
RBC: 4.94 MIL/uL (ref 3.87–5.11)
RDW: 13.3 % (ref 11.5–15.5)
WBC: 9 10*3/uL (ref 4.0–10.5)

## 2010-09-22 LAB — URINE MICROSCOPIC-ADD ON

## 2010-09-22 LAB — DIFFERENTIAL
Basophils Absolute: 0 10*3/uL (ref 0.0–0.1)
Lymphocytes Relative: 29 % (ref 12–46)
Monocytes Absolute: 0.5 10*3/uL (ref 0.1–1.0)
Monocytes Relative: 6 % (ref 3–12)
Neutro Abs: 5.7 10*3/uL (ref 1.7–7.7)

## 2010-09-22 LAB — URINALYSIS, ROUTINE W REFLEX MICROSCOPIC
Bilirubin Urine: NEGATIVE
Nitrite: NEGATIVE
Specific Gravity, Urine: 1.029 (ref 1.005–1.030)
pH: 5.5 (ref 5.0–8.0)

## 2010-09-22 LAB — GLUCOSE, CAPILLARY: Glucose-Capillary: 492 mg/dL — ABNORMAL HIGH (ref 70–99)

## 2010-09-25 LAB — GLUCOSE, CAPILLARY: Glucose-Capillary: 252 mg/dL — ABNORMAL HIGH (ref 70–99)

## 2010-09-30 LAB — CARDIAC PANEL(CRET KIN+CKTOT+MB+TROPI)
CK, MB: 1.8 ng/mL (ref 0.3–4.0)
CK, MB: 2.2 ng/mL (ref 0.3–4.0)
Relative Index: 1.1 (ref 0.0–2.5)
Total CK: 161 U/L (ref 7–177)
Troponin I: <0.01 ng/mL (ref 0.00–0.06)
Troponin I: <0.01 ng/mL (ref 0.00–0.06)

## 2010-09-30 LAB — CBC
HCT: 40.3 % (ref 36.0–46.0)
Hemoglobin: 13.5 g/dL (ref 12.0–15.0)
MCHC: 33.9 g/dL (ref 30.0–36.0)
MCHC: 34.2 g/dL (ref 30.0–36.0)
Platelets: 205 10*3/uL (ref 150–400)
RDW: 13.9 % (ref 11.5–15.5)
RDW: 14 % (ref 11.5–15.5)

## 2010-09-30 LAB — BASIC METABOLIC PANEL
BUN: 15 mg/dL (ref 6–23)
Calcium: 8.6 mg/dL (ref 8.4–10.5)
Chloride: 106 mEq/L (ref 96–112)
Creatinine, Ser: 0.72 mg/dL (ref 0.4–1.2)
GFR calc Af Amer: >60 mL/min (ref >60)
GFR calc Af Amer: >60 mL/min (ref >60)
GFR calc non Af Amer: >60 mL/min (ref >60)
Glucose, Bld: 179 mg/dL — ABNORMAL HIGH (ref 70–99)
Potassium: 3.9 mEq/L (ref 3.5–5.1)
Sodium: 138 mEq/L (ref 135–145)
Sodium: 140 mEq/L (ref 135–145)

## 2010-09-30 LAB — GLUCOSE, CAPILLARY
Glucose-Capillary: 128 mg/dL — ABNORMAL HIGH (ref 70–99)
Glucose-Capillary: 157 mg/dL — ABNORMAL HIGH (ref 70–99)
Glucose-Capillary: 179 mg/dL — ABNORMAL HIGH (ref 70–99)
Glucose-Capillary: 190 mg/dL — ABNORMAL HIGH (ref 70–99)
Glucose-Capillary: 238 mg/dL — ABNORMAL HIGH (ref 70–99)
Glucose-Capillary: 240 mg/dL — ABNORMAL HIGH (ref 70–99)
Glucose-Capillary: 256 mg/dL — ABNORMAL HIGH (ref 70–99)
Glucose-Capillary: 275 mg/dL — ABNORMAL HIGH (ref 70–99)
Glucose-Capillary: 281 mg/dL — ABNORMAL HIGH (ref 70–99)

## 2010-09-30 LAB — RAPID URINE DRUG SCREEN, HOSP PERFORMED
Barbiturates: NOT DETECTED
Benzodiazepines: NOT DETECTED
Cocaine: POSITIVE — AB
Opiates: NOT DETECTED

## 2010-09-30 LAB — D-DIMER, QUANTITATIVE: D-Dimer, Quant: 0.29 ug/mL-FEU (ref 0.00–0.48)

## 2010-10-10 LAB — DIFFERENTIAL
Lymphs Abs: 3 10*3/uL (ref 0.7–4.0)
Monocytes Absolute: 0.7 10*3/uL (ref 0.1–1.0)
Monocytes Relative: 7 % (ref 3–12)
Neutro Abs: 6.5 10*3/uL (ref 1.7–7.7)
Neutrophils Relative %: 63 % (ref 43–77)

## 2010-10-10 LAB — BASIC METABOLIC PANEL
BUN: 7 mg/dL (ref 6–23)
CO2: 27 mEq/L (ref 19–32)
Chloride: 107 mEq/L (ref 96–112)
Creatinine, Ser: 0.65 mg/dL (ref 0.4–1.2)
GFR calc non Af Amer: >60 mL/min (ref >60)
Glucose, Bld: 115 mg/dL — ABNORMAL HIGH (ref 70–99)
Potassium: 4.6 mEq/L (ref 3.5–5.1)
Sodium: 139 mEq/L (ref 135–145)

## 2010-10-10 LAB — URINALYSIS, ROUTINE W REFLEX MICROSCOPIC
Glucose, UA: >1000 mg/dL — AB
Ketones, ur: 15 mg/dL — AB
Leukocytes, UA: NEGATIVE
Nitrite: NEGATIVE
Protein, ur: NEGATIVE mg/dL
Urobilinogen, UA: 0.2 mg/dL (ref 0.0–1.0)

## 2010-10-10 LAB — CARDIAC PANEL(CRET KIN+CKTOT+MB+TROPI)
CK, MB: 1.6 ng/mL (ref 0.3–4.0)
Relative Index: 1.4 (ref 0.0–2.5)

## 2010-10-10 LAB — GLUCOSE, CAPILLARY
Glucose-Capillary: 275 mg/dL — ABNORMAL HIGH (ref 70–99)
Glucose-Capillary: 309 mg/dL — ABNORMAL HIGH (ref 70–99)
Glucose-Capillary: 505 mg/dL (ref 70–99)

## 2010-10-10 LAB — POCT I-STAT, CHEM 8
Chloride: 100 mEq/L (ref 96–112)
Glucose, Bld: 554 mg/dL (ref 70–99)
HCT: 47 % — ABNORMAL HIGH (ref 36.0–46.0)
Hemoglobin: 16 g/dL — ABNORMAL HIGH (ref 12.0–15.0)
Potassium: 4.1 mEq/L (ref 3.5–5.1)

## 2010-10-10 LAB — RAPID URINE DRUG SCREEN, HOSP PERFORMED
Amphetamines: NOT DETECTED
Benzodiazepines: NOT DETECTED
Cocaine: NOT DETECTED
Tetrahydrocannabinol: NOT DETECTED

## 2010-10-10 LAB — POCT I-STAT 3, VENOUS BLOOD GAS (G3P V)
Acid-Base Excess: 1 mmol/L (ref 0.0–2.0)
Bicarbonate: 25.4 mEq/L — ABNORMAL HIGH (ref 20.0–24.0)
Patient temperature: 97
pH, Ven: 7.439 — ABNORMAL HIGH (ref 7.250–7.300)

## 2010-10-10 LAB — CULTURE, BLOOD (ROUTINE X 2)
Culture: NO GROWTH
Culture: NO GROWTH

## 2010-10-10 LAB — CBC
HCT: 42.7 % (ref 36.0–46.0)
Hemoglobin: 13.3 g/dL (ref 12.0–15.0)
Hemoglobin: 14.6 g/dL (ref 12.0–15.0)
MCV: 91.9 fL (ref 78.0–100.0)
MCV: 92 fL (ref 78.0–100.0)
RBC: 4.16 MIL/uL (ref 3.87–5.11)
RBC: 4.64 MIL/uL (ref 3.87–5.11)
RDW: 13.3 % (ref 11.5–15.5)
WBC: 10.4 10*3/uL (ref 4.0–10.5)
WBC: 9.3 10*3/uL (ref 4.0–10.5)

## 2010-10-10 LAB — URINE MICROSCOPIC-ADD ON

## 2010-10-10 LAB — EXPECTORATED SPUTUM ASSESSMENT W GRAM STAIN, RFLX TO RESP C

## 2010-10-10 LAB — LEGIONELLA ANTIGEN, URINE: Legionella Antigen, Urine: NEGATIVE

## 2010-10-10 LAB — POCT CARDIAC MARKERS: Myoglobin, poc: 83.8 ng/mL (ref 12–200)

## 2010-10-10 LAB — LACTIC ACID, PLASMA: Lactic Acid, Venous: 1.3 mmol/L (ref 0.5–2.2)

## 2010-10-14 LAB — GLUCOSE, CAPILLARY
Glucose-Capillary: 108 mg/dL — ABNORMAL HIGH (ref 70–99)
Glucose-Capillary: 153 mg/dL — ABNORMAL HIGH (ref 70–99)

## 2010-10-15 LAB — GLUCOSE, CAPILLARY

## 2010-10-20 ENCOUNTER — Other Ambulatory Visit: Payer: Self-pay | Admitting: *Deleted

## 2010-10-23 MED ORDER — LISINOPRIL 20 MG PO TABS: 20.0000 mg | ORAL_TABLET | Freq: Every day | ORAL | Status: DC

## 2010-10-24 LAB — GLUCOSE, CAPILLARY: Glucose-Capillary: 329 mg/dL — ABNORMAL HIGH (ref 70–99)

## 2010-11-08 ENCOUNTER — Other Ambulatory Visit: Payer: Self-pay | Admitting: Internal Medicine

## 2010-11-22 NOTE — Discharge Summary (Signed)
[name], [name]            ACCOUNT NO.:  [id number]   MEDICAL RECORD NO.:  [id number]          PATIENT TYPE:  INP   LOCATION:  5526                         FACILITY:  [facility]   PHYSICIAN:  Fransisco Hertz, M.D.  DATE OF BIRTH:  September 14, 1955   DATE OF ADMISSION:  10/14/2007  DATE OF DISCHARGE:  10/20/2007                               DISCHARGE SUMMARY   CONSULTANT:  Dr. Corliss Skains with surgery.   DISCHARGE DIAGNOSES:  1. Diabetic ketoacidosis secondary to noncompliance with insulin      secondary to cough.  2. Left thigh/labial abscess (methicillin-resistant Staphylococcus      aureus status post incision and drainage x2).  3. Diabetes mellitus type 2.  4. Chest pain likely secondary to nausea and vomiting.  5. Left lower extremity pain (ankle, chronic since fracture, status      post repair).  She has a history of left trimalleolar fracture      equivalent, status post open reduction and internal fixation, 2007.  6. Hypertension.  7. Prerenal acute kidney disease.  8. Gastroesophageal reflux disease.  9. History of polysubstance abuse with a urine drug screen positive      for cocaine at this admission; ethanol, cocaine, and tobacco  10.Low-back pain.  11.Obesity.  12.Tobacco abuse.  13.History of right Bartholin's gland cyst.   MEDICATION LIST AT DISCHARGE:  1. Lisinopril 10 mg p.o. q. daily.  2. NovoLog Mix 77/30 55 units in a.m. and 30 units in p.m.  3. Bactrim DS 1 tablet b.i.d. for 10 days.  4. Vicodin 5/500 one tablet q.4 h p.r.n. pain.  The patient will have      an appointment with Dr. Corliss Skains with surgery in 7-14 days.  The      patient needs to call for appointment and also will have an      appointment with Dr. Reynold Bowen in 1-2 weeks.  The patient was      discharged during the weekends, so an appointment could be      scheduled.  The patient was instructed to shower and bathe every      day, and to change the dressing 1 to 2 times a day as needed.   CONDITION ON  DISCHARGE:  Improved.   PROCEDURES:  I&D in ED [facility], and also surgery 1 day before discharge for  her abscess.  She also had x-ray of her chest on admission showing  bibasilar atelectasis.  No focal infiltrates or evidence for pulmonary  edema.  An x-ray of her left ankle showing previous plate and screw  fixation of fracture dislocation at the left ankle.  The lower most  tibial screw is fractured in its midportion with degenerative arthritic  changes associated with her tibia collar joint.   CT of her left leg at the level of the abscess showed no evidence of  pelvic abscess or pelvic osteomyelitis, probably reactive left inguinal  adenopathy, suspected incidental Gartner duct cyst on the left, left  cellulitis in the inferior left buttocks, and medial upper left thigh.  No evidence of soft tissue abscess, myofascitis, or osteomyelitis.  Bilateral femoral artery  atherosclerosis without evidence of large  vessel occlusion.   BRIEF ADMITTING HPI:  This is a 55 year old African American woman with  history of diabetes, hypertension, and GERD presenting with several  complaints.  She noted diffuse weakness and fatigue for the last 4 weeks  since she was out of her insulin because of monetary problems.  She also  had new-onset nausea and vomiting for the last 2 weeks, almost daily,  not being able to keep anything down.  Emesis is yellow with no blood.  She had no diarrhea or abdominal pain.  No chest pain or shortness of  breath.   She also noticed a left inferior thigh boil with surrounding erythema  and warmth for approximately 2 weeks.  She had a pain, subjective fever,  and chills.  She says that she has a history of small boils, but this  was larger and more painful.  She also has a history of ankle fracture  in 2007, and she also has pain at that site.  She denied headache, but  admitted for blurry vision, polydipsia, polyuria, intermittent numbness  and burning of lower  extremities and feet for the last 6 months.   PHYSICAL EXAMINATION:  VITAL SIGNS:  Temperature 98.6, blood pressure  149/90, pulse 106, respiration rate 24, and oxygen saturation 99 on room  air.  GENERAL:  She was tired but in no acute distress.  HEENT:  Eyes: PERRLA.  Extraocular movements intact.  No icterus or  injection.  ENT:  Oropharynx dry and clear.  NECK: Supple.  No lymphadenopathy.  No thyroidmegaly or JVD.  RESPIRATIONS:  Clear to auscultation bilaterally without wheezes or  crackles.  CARDIOVASCULAR:  Tachycardic without murmurs, rubs, or gallops.  Normal  S1-S2.  Pulses 2+ throughout bilaterally.  GI:  Soft. Mildly tender to palpation in the midepigastrium.  No  rebound, guarding, or positive bowel sounds.  No mass.  EXTREMITIES:  No edema.  Marked asymmetry in musculature in the left  leg, smaller than right, more than over 3 cm cellulitis with erythema in  the left inferior and medial thigh with a drain in place.  SKIN:  As above.  NEUROLOGICAL:  Nonfocal.  PSYCH:  Cooperative.   LABS:  Sodium 135, potassium 4.7, chloride 101, bicarbonate 14, BUN 12,  creatinine 1.48, glucose 535, anion gap 20, bilirubin 2.3, alkaline  phosphatase 71, AST 15, ALT 18, protein 8.7, albumin see 3.1, and  calcium 9.5.  White blood count 18.1, hemoglobin 15.9, platelets 247,  MCV 91.4, and ANC 14.4.  Cardiac panel negative x3.  UDS positive for  cocaine.  Hemoglobin A1c level 11.5.  Alcohol level less than 5, small  urinalysis, and positive for more than a 1000 glucose, more than 80  ketones, 30 of protein, and moderate blood.  Microscopy shows few  bacteria.  The culture of her wound grew MRSA and that was negative to  Bactrim.  Her blood cultures were negative.   ASSESSMENT AND PLAN:  1. Diabetic ketoacidosis in a patient with diabetes mellitus type 2.      The patient's glucose was more than 250, bicarbonate was less than      15, ketones present in blood, and urine.  We diagnosed  her with      diabetic ketoacidosis, started her on DKA protocol, and initially      insulin IV, followed by the insulin drip.  We checked capillary      blood glucose every hour and basic metabolic panel  every 2 hours,      and then started D5 normal saline when the anion gap closed. When      her glucose reached 200 and her anion gap remained closed, we added      Lantus and the patient continued to do well on this regimen.  We      maintained her on sliding scale too.  We investigated for causes of      her diabetic ketoacidosis.  However, it was felt that this was most      likely due to her insulin deficiency, since she was out of her      insulin for at least 3 weeks.  Because of the infectious      precipitant in her left thigh abscess, we started her initially on      doxycycline and then we treated her with vancomycin IV.  We checked      urinalysis, blood cultures, and chest x-ray as mentioned above.      Urine pregnancy test was negative.  She ruled out for cardiac      ischemia with normal EKGs and cardiac enzymes.  We discharged her      on a regimen of 70/30 insulin.  2. Nausea and vomiting.  This was likely a consequence of diabetes      mellitus type 2.  We gave her Zofran and fluids, and she improved.  3. Left buttock methicillin-resistant Staphylococcus aureus abscess.      This was I&D'd  adversely long.  We added doxycycline upon      transfer.  However, the patient did not improve significantly, so      we called 3 days before discharge, and they felt that the patient      would benefit from surgery.  She was taken to surgery the day prior      to discharge, she was started on vancomycin at that time.  The      wound was packed, and she was ready to be discharged on 10/16/2007      with no complications.  She will have an appointment with Dr.      Corliss Skains.  4. Hypertension.  We initially held blood pressure meds initially      since she was dehydrated from her diabetic  ketoacidosis.  At      discharge, we started her home medicines.  5. Increased creatinine.  This was felt that it is most likely due to      dehydration.  We hydrated the patient and her creatinine improved      before discharge.   LABS AT DISCHARGE:  Sodium 138, potassium 4.6, chloride 105, bicarbonate  27, glucose 326, BUN 6, creatinine 0.74, and calcium 8.4.  A CBC showed  white blood cells 9.3, hemoglobin 12.4, hematocrit 36.6, and platelet  count 244.      Carlus Pavlov, M.D.  Electronically Signed      Fransisco Hertz, M.D.  Electronically Signed    CG/MEDQ  D:  10/21/2007  T:  10/22/2007  Job:  [id number]

## 2010-11-22 NOTE — Op Note (Signed)
NAMEANAM, BOBBY            ACCOUNT NO.:  [id number]   MEDICAL RECORD NO.:  [id number]          PATIENT TYPE:  INP   LOCATION:  5526                         FACILITY:  [facility]   PHYSICIAN:  Wilmon Arms. Corliss Skains, M.D. DATE OF BIRTH:  [birth year]   DATE OF PROCEDURE:  10/18/2007  DATE OF DISCHARGE:                               OPERATIVE REPORT   PREOPERATIVE DIAGNOSIS:  Left gluteal abscess.   POSTOPERATIVE DIAGNOSES:  1. Left gluteal abscess.  2. Left labial abscess.   SURGEON:  Wilmon Arms. Tsuei, MD.   ANESTHESIA:  General endotracheal.   INDICATIONS:  The patient is a 55 year old female who is a poorly  compliant diabetic, also with substance abuse issues who presented with  DKA.  She states, she had had a palpable mass on her left buttocks for  about 3 weeks.  It has enlarged and become very tender.  She has been in  the hospital for 4 days.  An incision and drainage was performed in the  emergency department of the left buttock abscess and some pus was  drained; however, the hole is quite small and does not appear that the  abscess has adequately drained.  It has continued to progress and it is  now involving the left posterior labia.   DESCRIPTION OF PROCEDURE:  The patient was brought to the operating room  and placed in supine position in the operating table.  After an adequate  level of general anesthesia was obtained, the legs were placed in  lithotomy position in [place] stirrups.  Her perineum was prepped with  Betadine and draped in sterile fashion.  The left labia was seen much  more swollen than the right.  She has 2 small openings in the left  buttock abscess.  After a time-out was performed, I inserted a clamp  into the 2 small holes.  The abscess seemed to track towards the labia.  I cut an elliptical incision of infected tissue from the middle of the  abscess.  We probed the abscess cavity, it did not appear to communicate  with the labial abscess.  Some  necrotic tissue was debrided.  Cautery  was used for hemostasis.  The wound was packed with saline-soaked gauze.  We then opened the labial abscess, and I debrided it as well.  This was  also packed with saline-soaked gauze.  An ABD dressing was applied.  The  patient was extubated and brought to recovery in stable condition.  All  sponge, instrument, and needle counts were correct.      Wilmon Arms. Tsuei, M.D.  Electronically Signed     MKT/MEDQ  D:  10/18/2007  T:  10/19/2007  Job:  [id number]

## 2010-11-22 NOTE — Op Note (Signed)
NAMEZAYLYNN, [name]            ACCOUNT NO.:  [id number]   MEDICAL RECORD NO.:  [id number]          PATIENT TYPE:  INP   LOCATION:  5507                         FACILITY:  [facility]   PHYSICIAN:  Velora Heckler, MD      DATE OF BIRTH:  03-10-1956   DATE OF PROCEDURE:  03/21/2008  DATE OF DISCHARGE:                               OPERATIVE REPORT   PREOPERATIVE DIAGNOSIS:  Multiple abscesses, bilateral buttocks.   POSTOPERATIVE DIAGNOSIS:  Multiple abscesses, bilateral buttocks.   PROCEDURES:  1. Incision, drainage, and debridement of abscess, left buttock, skin      and subcutaneous tissue, 3 cm in diameter.  2. Incision, drainage, and debridement of skin and subcutaneous      tissue, left buttock, 2 cm in diameter.  3. Incision, drainage, and debridement of abscess, right buttock, 3 cm      in diameter.  4. Incision, drainage, and debridement of abscess, right buttock, 4 cm      in diameter.   SURGEON:  Velora Heckler, MD, FACS   ANESTHESIA:  General.   ESTIMATED BLOOD LOSS:  Minimal.   PREPARATION:  Betadine.   COMPLICATIONS:  None.   INDICATIONS:  The patient is a 55 year old black female with recurrent  MRSA abscesses involving both buttocks.  She is admitted on the medical  service.  After control of diabetes and preoperative assessment, the  patient is prepared and brought to the operating room.   BODY OF REPORT:  Procedure was done in OR #60 at [facility].  The patient was brought to the operating room and placed in a  supine position on a stretcher in the operating room.  Following  administration of general anesthesia, the patient was turned to a prone  position on the operating room table.  Bilateral buttocks were prepped  and draped in the usual strict aseptic fashion.   The left buttock has a necrotic ulcerated wound in the mid buttock.  This is the saucerization using the electrocautery.  Diameter of the  wound is 3 cm.  The skin and  subcutaneous tissues were debrided and  hemostasis obtained with electrocautery.  Wound was packed with Betadine-  soaked 4 x 4 gauze sponges.   In the medial left buttock, there was a draining sinus tract.  This was  incised.  Purulent fluid was sent for culture.  This wound was also  saucerization with debridement of skin and subcutaneous tissues with  electrocautery.  It measured 2 cm in diameter.   On the right buttock located medially is a draining sinus with  induration.  This was incised and cultures were submitted.  It was also  saucerization and a 3-cm diameter defect was created with debridement of  skin and subcutaneous tissues.  This wound was also packed with Betadine-  soaked sponges as were all wounds.   Next, in the lateral right buttock, another draining sinus was noted.  There was surrounding induration.  This was incised and debrided of skin  and subcutaneous tissues relatively deeply.  Hemostasis was obtained  with electrocautery.  Skin defect  measured 4 cm in diameter.  Wound was  packed with Betadine-soaked sponges.  Dry gauze dressings and ABD pads  were applied to bilateral buttocks.  The patient was awakened from  anesthesia and brought to the recovery room in stable condition.  The  patient tolerated the procedure well.      Velora Heckler, MD  Electronically Signed     TMG/MEDQ  D:  03/21/2008  T:  03/22/2008  Job:  [id number]   cc:   Fransisco Hertz, M.D.

## 2010-11-22 NOTE — Consult Note (Signed)
[name], [name]            ACCOUNT NO.:  [id number]   MEDICAL RECORD NO.:  [id number]          PATIENT TYPE:  INP   LOCATION:  5526                         FACILITY:  [facility]   PHYSICIAN:  Wilmon Arms. Corliss Skains, M.D. DATE OF BIRTH:  15-May-1956   DATE OF CONSULTATION:  10/18/2007  DATE OF DISCHARGE:                                 CONSULTATION   REASON FOR CONSULTATION:  Left buttock abscess.   HISTORY OF PRESENT ILLNESS:  The patient is a 55 year old female with  noncompliant diabetes, hypertension, and gastroesophageal reflux who  presents with a 3-week history of a slowly enlarging boil on the left  buttock.  This has progressed and become swollen and painful.  She  presents to the emergency department on October 14, 2007, with severe DKA.  At that time, she was noted to have a large abscess which apparently was  drained by the emergency department physician.  There was a very small  hole and apparently a drain was placed.  The drain was inadvertently  removed fairly quickly.  This wound has been draining a small amount of  purulent fluid each day.  She was admitted to the internal medicine  teaching service for management of her DKA.  They have had her on  vancomycin.  The wound culture grew out MRSA.  She is now on p.o.  Bactrim.  We are consulted today because the wound seems to be  worsening.  The swelling, tenderness, and erythema seem to be tracking  towards the posterior left labia.  The patient had undergone a CT scan  on October 15, 2007, which showed no abscess or osteomyelitis but only some  subcutaneous inflammation.  The patient states that the tenderness is  worse.   ALLERGIES:  None.   HOME MEDICATIONS:  NovoLog, which the patient has not taken for at least  2 weeks and blood pressure medications, which the patient does not  remember the name of and apparently she has not been taking.   PAST MEDICAL HISTORY:  Diabetes type 1, hypertension, gastroesophageal  reflux  disease, low back pain, tobacco abuse, alcohol abuse, cocaine  abuse, obesity, and history of Bartholin gland cyst.   PAST SURGICAL HISTORY:  Excision of right Bartholin gland cyst and ORIF  of left ankle fracture.   FAMILY HISTORY:  Father is deceased from a stroke.   REVIEW OF SYSTEMS:  Please see the review of systems in the history and  physical.   PHYSICAL EXAMINATION:  VITAL SIGNS:  Temperature 100.3, currently 98.6;  pulse 76; and blood pressure 123/77.  GENERAL:  This is an obese African-American female, in no apparent  distress.  HEENT:  EOMI.  Sclerae are anicteric.  NECK:  No masses.  No thyromegaly.  LUNGS:  Clear.  Normal respiratory effort.  HEART:  Regular rate and rhythm.  ABDOMEN:  Positive bowel sounds, soft, nontender.  EXTREMITIES:  The left posterior medial thigh near the groin shows  approximately 10 x 7 cm area of thick induration.  There is lot of  tenderness associated with this.  This seems to track towards the  posterior left  labia.  There are 2 small holes in the midportion of the  abscess, which seem to be draining some purulent fluid.   LABORATORY DATA:  White count 10.8, hemoglobin 12.6, and electrolytes  within normal limits.  Serum glucose 211.   IMPRESSION:  Partially drained left buttock abscess with progression.  This area needs wide debridement under anesthesia.   PLAN:  We will take the patient to the operating room later today for  incision and drainage of this abscess.  She was not made NPO, so we have  to wait several hours.  No need for further CT scan.      Wilmon Arms. Tsuei, M.D.  Electronically Signed     MKT/MEDQ  D:  10/18/2007  T:  10/19/2007  Job:  [id number]

## 2010-11-22 NOTE — Consult Note (Signed)
NAMEMARCELYN, [name]            ACCOUNT NO.:  [id number]   MEDICAL RECORD NO.:  [id number]          PATIENT TYPE:  INP   LOCATION:  5507                         FACILITY:  [facility]   PHYSICIAN:  Alfonse Ras, MD   DATE OF BIRTH:  [birth year]   DATE OF CONSULTATION:  DATE OF DISCHARGE:                                 CONSULTATION   REFERRING PHYSICIAN:  Fransisco Hertz, MD   REASON FOR SURGICAL CONSULTATION:  Bilateral buttock abscesses, history  of MRSA, and previous buttock abscess.   HISTORY OF PRESENT ILLNESS:  The patient is a 55 year old noncompliant,  diabetic, morbidly obese, black female with a history of substance  abuse, last used cocaine 4 days ago and alcohol last evening with about  a 2-week history of a draining abscess in her chronic left buttock and  new more painful abscesses on her right buttock.  These were attempted  to be drained in the emergency room.  It was difficult to drain at that  time under local anesthesia.   Past medical history is significant for  1. Diabetes mellitus type 2.  2. Substance abuse.  3. Hypertension.  4. Prerenal acute kidney disease.  5. Gastroesophageal reflux disease.   Medications include,  1. Lisinopril  2. NovoLog 70/30 daily.  3. She is currently placed on vancomycin, and Zosyn, and sliding scale      insulin moderate dosage.   On limited physical exam, she is an age-appropriate morbidly obese white  female in no distress.  On limited physical exam of the buttocks, she  has a well-drained with some exposed subcutaneous fat with minimal  drainage in the left buttock and two separate areas in the right buttock  approximately 4 cm in induration and minimal fluctuance with some  pressure applied.  I do get significant purulent drainage; however, the  patient was very resistant to any further local therapy.   IMPRESSION:  Bilateral buttock abscesses, right buttock x2 and left  buttock x1.   PLAN:  Since the patient has  eaten today, she will need an incision and  drainage under general anesthesia in the operating room and we will go  ahead and try to schedule that for tomorrow.     Alfonse Ras, MD  Electronically Signed    KRE/MEDQ  D:  03/20/2008  T:  03/21/2008  Job:  [id number]

## 2010-11-25 NOTE — Discharge Summary (Signed)
[place]  Patient:    [name]                    MRN: [id number] Adm. Date:  [id number] Disc. Date: 10/30/99 Attending:  Phifer, Harriett Sine [place] Dictator:   Karlene Einstein, M.D.                           Discharge Summary  NO DICTATION DD:  10/31/99 TD:  10/31/99 Job: [id number]

## 2010-11-25 NOTE — Op Note (Signed)
[name], MCMASTER            ACCOUNT NO.:  [id number]   MEDICAL RECORD NO.:  [id number]          PATIENT TYPE:  AMB   LOCATION:  SDS                          FACILITY:  [facility]   PHYSICIAN:  Doralee Albino. Carola Frost, M.D. DATE OF BIRTH:  09-05-1955   DATE OF PROCEDURE:  02/01/2006  DATE OF DISCHARGE:                                 OPERATIVE REPORT   PREOPERATIVE DIAGNOSIS:  Left trimalleolar fracture equivalent.   POSTOPERATIVE DIAGNOSIS:  Left trimalleolar fracture equivalent.   PROCEDURE:  Open reduction and internal fixation of left ankle lateral  malleolus and open treatment of deltoid.   SURGEON:  Doralee Albino. Carola Frost, M.D.   ASSISTANT:  Hardin Negus, PA-C.   ANESTHESIA:  General.   COMPLICATIONS:  None.   TOTAL TOURNIQUET TIME:  Sixty minutes.   DISPOSITION:  To PACU.   CONDITION:  Stable.   BRIEF SUMMARY, MEDICATIONS AND PROCEDURE:  Jayde Sallade is a 55 year old  female diabetic who sustained a fracture subluxation with widening of the  medial clear space approximately 4 weeks ago.  She was treated with  splinting in the emergency department and apparently lost to followup.  She  returned to the emergency department approximately three weeks later and  then was referred to our office for definitive management.  We discussed  with her the risks and benefits of surgery including the possibility of  infection, nerve injury, vessel injury, failure to completely restore  anatomic tibiotalar alignment and possibility of further surgery in addition  to thromboembolic risk.  She wished to proceed.   DESCRIPTION OF PROCEDURE:  Ms. Louderback received preoperative antibiotics,  taken to the operating room where general anesthesia was induced.  Her left  lower extremity was prepped and draped in the usual sterile fashion with a  tourniquet about the thigh.  We initially began without the tourniquet,  making a standard lateral approach and watching for the superficial peroneal  nerve and the proximal portion of the wound.  We then exposed the fracture  site which had extensive callus.  This required a considerable time for  removal of this callus and then mobilization of the fibula distally.  We  were unable to reduce the medial clear space and then made an accessory  medial incision.  During the medial incision we did encounter venous oozing  and elected to go ahead and elevate the tourniquet at that time.  We then  exposed the medial gutter and removed fragments of cartilage surface off the  medial aspect of the talus as well as avulsion of the deltoid ligament.  We  were then able to anatomically reduce the tibiotalar joint.  We selected a 3-  5 Recon plate and plotted after placing an anterior to posterior lag screw  in standard fashion.  The Recon plate was used to neutralize the fracture  and we also placed fixation into the tibia to augment the strength of the  construct.  Wounds were copiously irrigated and closed in standard  fashion  after three view fluoro images confirmed appropriate reduction of the  mortise and placement of hardware.  Tourniquet was deflated after  standard  layered closure and a sterile gently compressive dressing applied.  The  patient was awakened from anesthesia and transported to the PACU in stable  condition.   PROGNOSIS:  Because of her diabetes, Ms. Achorn remains at increased risk  for infection given her diabetes as well as possibility of loss of fixation  and delayed union and nonunion given the delayed manner in which this  fracture was treated.  Also this increases her risk of arthropathy given the  cartilage injury visualized at the time of surgery.   She will be non weightbearing in a splint until she returns to clinic at  which time she will be converted to a cast which we anticipate leaving on  for four weeks.  She has been mobile and will be on aspirin daily for DVT  prophylaxis.      Doralee Albino. Carola Frost, M.D.   Electronically Signed     MHH/MEDQ  D:  02/01/2006  T:  02/01/2006  Job:  [id number]

## 2010-11-25 NOTE — Discharge Summary (Signed)
[name], [name]            ACCOUNT NO.:  [id number]   MEDICAL RECORD NO.:  [id number]          PATIENT TYPE:  INP   LOCATION:  4737                         FACILITY:  [facility]   PHYSICIAN:  Alvester Morin, M.D.  DATE OF BIRTH:  1956/06/16   DATE OF ADMISSION:  10/26/2006  DATE OF DISCHARGE:  10/30/2006                               DISCHARGE SUMMARY   DISCHARGE DIAGNOSES:  1. Diabetes mellitus, diabetic ketoacidosis.  2. Pneumonia versus bronchitis.  3. Nausea and vomiting.  4. Hypokalemia.  5. Diabetes.  6. Hypertension.  7. Hypertriglyceridemia.  8. Gastroesophageal reflux disease.  9. Urinary tract infection with yeast.  10.Polysubstance abuse.   DISCHARGE MEDICATIONS:  1. Benazepril 20 mg p.o. daily.  2. Folic acid 1 mg p.o. daily.  3. Insulin 70/30, 45 units subcutaneously q.a.m., 40 units      subcutaneously before dinner.  4. Prevacid 30 mg p.o. daily.  5. Pravachol 40 mg p.o. daily.  6. Thiamine 100 mg p.o. daily.   DISPOSITION AND FOLLOW UP:  The patient is to follow up with [name] in the outpatient clinic to discuss diabetes education and to try  to further get her diabetes under control.  The patient is also to  follow up with Dr. Reynold Bowen in the outpatient clinic on November 07, 2006 at  4 p.m., at which time she will need a CMET, as well as a CBG checked, to  make sure her potassium and blood sugars are okay.  The patient will  also need a CMET at 4-6 weeks secondary to the recent starting of  Pravachol 40 mg p.o. daily.  The CMET needs to be monitored for  increasing LFT's.   PROCEDURES PERFORMED:  Chest x-ray on October 27, 2006 which showed no  acute chest findings.  The patient also had an x-ray of the chest on  October 29, 2006 which showed increased subsegmental atelectasis in the  right middle lower lobe with no signs of infiltrates.   CONSULTATIONS:  There were no consultations made.   BRIEF HISTORY AND PHYSICAL:  Primary care physician - Dr.  Reynold Bowen in the  outpatient clinic.   HISTORY OF PRESENT ILLNESS:  The patient is a 55 year old African-  American woman with a past medical history significant for diabetes  mellitus type 2 with a hemoglobin A1c greater than 14 in May of 2007,  hypertension, GERD, medication noncompliance, cocaine abuse, alcohol  abuse and depression, presenting to the ED with complaints of nausea and  vomiting x4 days, as well as not being able to tolerate any p.o.  medications, except for water.  The patient also presents with polyuria  and polydipsia, and it is reported that she has not taken her insulin  for 3 weeks.  She is often complaining of a productive cough with  streaks of blood x2 that has gotten worse.  It started out yellow and  has now progressed to being greenish in color.  The patient also  endorses fever and chills 2-3 days prior to admission.  The patient also  reports some epigastric burning abdominal pain, and nothing seems to  alleviate her pain and nothing seems to aggravate it.  The patient  denies dysuria, chest pain, shortness of breath, melena, hematochezia.   ALLERGIES:  No known drug allergies.   PAST MEDICAL HISTORY:  1. GERD.  2. Diabetes type 2.  3. History of cocaine abuse.  4. History of alcohol abuse.  5. Obesity.  6. Hypertension.  7. Medication noncompliance.  8. History of depression.   HOME MEDICATIONS:  1. Benazepril 20 mg p.o. daily.  2. Pepcid 20 mg p.o. daily.  3. 70/30 with 45 units in the morning, 35 units at night.   SOCIAL HISTORY:  The patient is a current smoker; reports 5 cigarettes a  day x20 years.  She also has a history of alcohol and reports cocaine 1  day prior to admission.  She is married, and she is self pay.   FAMILY HISTORY:  Mother passed away with heart disease at the age of 37.  Father had a stroke and passed away at the age of 48, as well.  She has  3 healthy children.   REVIEW OF SYSTEMS:  Please see history of present  illness.   PHYSICAL EXAMINATION:  VITAL SIGNS:  Temperature was 98.7, blood  pressure equals 143/95, pulse equals 111, respiratory rate equals 18.  GENERAL:  The patient is laying in bed on her left side in mild  distress.  HEENT:  Eyes are anicteric.  No pallor.  ENT - moist oral mucosa.  No  edema.  No thrush.  NECK:  Supple.  No lymphadenopathy.  No carotid bruits.  RESPIRATORY:  Good air movement bilaterally.  CV:  Tachycardic, regular.  No murmurs, rubs or gallops.  GI:  Positive bowel sounds.  The patient has tenderness in the  epigastric area.  She has no guarding, no rebound, positive bowel  sounds.  EXTREMITIES:  She has positive pulses.  No edema.  SKIN:  No abnormal visible lesions.  No lymphadenopathy.  MUSCULOSKELETAL:  The patient has normal range of motion in the upper  and lower extremities.  NEUROLOGIC:  Grossly intact, nonfocal.  PSYCHIATRIC:  Per patient is alert and oriented x3.   ADMISSION LABORATORIES:  Sodium of 135, potassium 4.1, chloride 107,  bicarbonate 12 with an anion gap of 16, BUN 5, creatinine 0.6, glucose  376, bilirubin 1.3, alkaline phosphatase 6.9, AST 20, ALT 29, total  protein 8.9, albumin 3.7, calcium 9.3.  WBCs 11.2, hemoglobin 15.9,  platelets 225.  ANC equals 7.6.  MCV equals 91.8.  Urine pregnancy was  negative.  UA showed WBCs of 3-6, RBCs 0-2, bacteria rare, hyaline casts  rate, yeast.  UDS is positive for cocaine.  Lipase equals 24.  The  patient had acetone in the blood and alcohol level of 7.   HOSPITAL COURSE:  1. Diabetes mellitus/DKA.  The patient presented to [facility]      secondary to not taking her insulin and ending up in diabetic      ketoacidosis.  The patient was rapidly rehydrated with IV fluids      including normal saline boluses of 2 liters initially.  The patient      was also put on an insulin drip, which was gradually decreased.  As     her anion gap closed, her bicarbonate increased.  The patient was      then  transitioned with 70/30, and her insulin drip was turned off.      The patient's blood sugars were difficult to control.  Eventually,      we got them under better control with CBGs running at 200s to less      than 200s.  The patient was counseled on the absolute necessity for      her to take her insulin as prescribed.  We will make an appointment      with [name] in the outpatient clinic for diabetes education      and better control of her diabetes.  She will also see Dr. Reynold Bowen      in the outpatient clinic, who is her primary care physician.  The      patient will be sent home on insulin 70/30 where she is to take 45      units in the morning and 40 units at night prior to dinner.  The      patient's medication noncompliance is a big issue with her.  The      patient is instructed to record her blood sugars as much as      possible and present them to [name], as well as Dr. Reynold Bowen.      Of note, the patient was given 2 bottles of 70/30 from the      outpatient clinic.  The patient is also on benazepril for her      hypertension, which is added benefit, given her history of      diabetes.  The patient's last hemoglobin A1c was greater than 14.      This will definitely need to be addressed.  2. Pneumonia versus bronchitis.  The patient was given O2 via nasal      cannula to keep her saturations up.  She was also started on Avelox      which was subsequently discontinued secondary to an allergic      reaction which included a rash.  She was then put on azithromycin      for possible pneumonia.  The patient was not feverish during      hospitalization admission.  She was also put on p.r.n. albuterol.      Given the patient's chest x-ray and her clinical picture, I think      her shortness of breath was most likely secondary to a bronchitis      versus pneumonia.  It was decided not to continue her antibiotics      as she left the hospital.  This should be readdressed in  the      outpatient clinic to make sure that she has noted started spiking      fevers and has increase in her white blood count.  3. Nausea and vomiting.  This is likely secondary to the patient's      diabetic ketoacidosis.  She was put on Phenergan, and her nausea      and vomiting quickly resolved as her blood sugars were brought      under control with the diabetic ketoacidosis resolved.  4. Hypokalemia.  The patient initially was hyperkalemic secondary to      her diabetic ketoacidosis.  She had to be supplemented with      potassium until her gap was closed and her blood sugars were better      under control.  The patient's potassium was 3.9 on the day of      discharge.  5. Diarrhea.  On the morning prior to discharge, the patient reported      3 episodes of loose stools.  A  Clostridium difficile was checked,      but the patient did not have any further episodes of diarrhea or     bowel movements, for that matter, after she had them on the morning      prior to discharge.  Her Clostridium difficile order was      subsequently cancelled.  The patient's diarrhea could have been      secondary to her antibiotic usage.  6. Hypertension.  The patient had good control of her blood pressure      during her hospitalization.  Initially, when the patient was      admitted, her benazepril was held secondary to [place] trying to      rehydrate her.  The patient's benazepril was restarted prior to her      leaving the hospital.  7. Hypertriglyceridemia, hyperlipidemia.  The patient was started on      Pravachol during her hospitalization secondary to her elevated      triglycerides.  She will need to have a CMET checked in 4-6 weeks      in the outpatient clinic to make sure her LFT's have not increased.      The patient should also be asked about any myalgias.  8. Gastroesophageal reflux disease.  The patient's initial abdominal      pain was most likely secondary to her gastroesophageal  reflux      disease and medication noncompliance.  The patient was put back on      Protonix and was subsequently discontinued on her home dose of      Pepcid.  9. Urinary tract infection.  The patient was noted to have yeast in      her urine.  She was treated with Diflucan while she was in the      hospital.  10.Polysubstance abuse.  The patient was repeatedly counseled on the      need to discontinue her cocaine abuse.   DISCHARGE LABORATORIES:  WBCs 8.4, hemoglobin 12.5, hematocrit 36.7, MCV  93.4, platelets 183.  Sodium 141, potassium 3.9, chloride 109,  bicarbonate 28, glucose 235, BUN 3, creatinine 0.61.  GFR greater than  16.  Potassium 8.7, magnesium 1.9.  As mentioned, the patient's  hemoglobin A1c was 14.4, lipase was 24.  One set of cardiac enzymes was  negative.  The patient's cholesterol was 189.  Her triglycerides were  459.  HDL of 26.  LDL could not be calculated secondary to the elevated  triglyceride level.   DISCHARGE VITALS:  Temperature of 98.4, pulse 83, respiratory rate 20,  blood pressure 130/88 and O2 saturations 96% on room air.  The patient's  CBG at discharge was 166.  For further details, please see hospital  chart.      Rufina Falco, M.D.  Electronically Signed      Alvester Morin, M.D.  Electronically Signed    JY/MEDQ  D:  11/01/2006  T:  11/01/2006  Job:  [id number]   cc:   Su Grand, M.D.

## 2010-11-25 NOTE — Discharge Summary (Signed)
[name], [name]            ACCOUNT NO.:  [id number]   MEDICAL RECORD NO.:  [id number]          PATIENT TYPE:  INP   LOCATION:  3715                         FACILITY:  [facility]   PHYSICIAN:  Chauncey Reading, D.O.  DATE OF BIRTH:  04/27/1956   DATE OF ADMISSION:  06/21/2008  DATE OF DISCHARGE:  06/23/2008                               DISCHARGE SUMMARY   DISCHARGE DIAGNOSES:  1. Acute pancreatitis secondary to alcohol use.  2. Diabetes mellitus type 2.  3. Hypertension.  4. Gastroesophageal reflux disease.  5. Alcohol abuse.  6. Tobacco abuse.  7. Cocaine abuse.  8. Vaginal discharge, likely yeast infection.  9. History of methicillin-resistant Staphylococcus aureus.   DISCHARGE MEDICATIONS:  1. Lisinopril 10 mg p.o. daily.  2. NovoLog 70/30, 40 units subcu in a.m., 35 units subcu in p.m.  3. Metronidazole ER 750 mg p.o. daily.  4. Vicodin 5/325 mg 1-2 tablets p.o. p.r.n. for pain.   DISCHARGE CONDITION AND FOLLOWUP:  [name] was discharged in stable  condition with follow up with Dr. Reynold Bowen in the Internal Medicine  Outpatient Clinic at [facility] on July 13, 2008.  The patient will  have Social Work followup to schedule substance abuse counseling.  The  patient has been educated about the effects of alcohol and cocaine abuse  on her hospitalization for acute pancreatitis and that point needs to be  reiterated on followup.   PROCEDURES:  None.   CONSULTATIONS:  None.   HISTORY OF PRESENT ILLNESS:  A 55 year old African American female with  past medical history significant for alcohol abuse, cocaine abuse, GERD,  DM, and HTN, presented with a complaint of 4-5 days of epigastric pain,  rated in intensity of 8-9/10, it radiated from epigastrium to the right  upper quadrant and the right lower back.  The patient reported increased  pain while lying flat.  No alleviating factors.  The patient attempted  to self-medicate her pain with Goody's Headache Powder,  cocaine, and  shots of Hennessy the night before with no relief of symptoms.  The  patient denies any history of prior alcohol withdrawal.  The patient  also denies history of gallstones, trauma, hematemesis, melena, blood  per rectum, chest pain, greasy foul-smelling stool, or diarrhea.  The  patient affirms nausea, shortness of breath with increased pain when  lying flat.  The patient has a noted allergy to MOXIFLOXACIN in past  discharge.  The patient is unable to recall manifestation of allergy.   PHYSICAL EXAMINATION:  VITAL SIGNS:  Temperature 97.1, blood pressure  162/90, pulse 81, respirations 18, and O2 saturation 99% on room air.  GENERAL:  Mild distress, overweight.  EYES:  Normal, atraumatic, nonicteric.  NECK:  Supple.  No thyromegaly.  No lymphadenopathy.  RESPIRATORY:  Mild basilar rales.  No wheezes or rhonchi.  No increased  work of breathing.  CARDIOVASCULAR:  Regular rate and rhythm.  No murmurs, gallops, or rubs.  No bruits bilaterally.  Posterior tibial, dorsalis pedis, and radial  pulses are 2+ bilaterally.  GI:  Slightly distended, guarding, diffusely epigastric and right upper  quadrant tenderness greater than  rest of abdomen.  Bowel sounds present  EXTREMITIES:  Good range of motion.  No edema, warm.  Surgical scar on  left ankle, well healed.  GU:  Deferred.  SKIN:  No rashes, no petechiae, no jaundice.  NEUROLOGIC:  Normal gait.  Alert, and oriented x3.  Nonfocal.  Cranial  nerves II through XII grossly intact.  PSYCHIATRY:  Good mood, appropriate affect, logical thought processes.   ADMISSION LABORATORIES:  Sodium 134, potassium 3.8, chloride 102, bicarb  24, BUN 6, creatinine 0.75, and glucose 394.  White blood cell count  9.8, hemoglobin 10.1, hematocrit 44, platelets 187, absolute neutrophil  count 6.1, MCV 94.6.  AST 18, ALT 17, protein 7.7, albumin 3.4, calcium  8.9, lipase 2810.  Urine micro:  Few epithelial cells, 3-6 white blood  cells, 0-2 rbc's,  and few bacteria.  UA:  Specific gravity 1.014,  glucose more than 1000, ketones 15, 0.2 urobilinogen, all others within  normal limits.  Capillary blood glucose was 309 after 3 units of  insulin.  Myoglobin 67, CK-MB 1.7, troponin less than 0.05.   CT of abdomen and pelvis showed no acute abdominal processes, mild  colonic diverticulosis, and no pelvic processes, study was done with  contrast.  Chest x-ray with abdomen series show chronic bronchitic  changes and chronic right basilar atelectasis, biconvex thoracolumbar  scoliosis.  No acute abdominal findings.   HOSPITAL COURSE:  1. Acute pancreatitis.  The patient admitted on June 21, 2008, for      acute pancreatitis with a lipase of 2810.  The patient was given      p.r.n. Morphine for pain, Zofran for nausea, and aggressive      hydration.  CT showed no acute abdominal processes.  The patient      was advanced to clears on the morning of June 22, 2008, and to      soft that evening.  The patient was advanced to solids in the      morning of June 23, 2008, and tolerated diet well.  The patient      stated she was ready for discharge.  The patient was educated about      the importance of substance abuse counseling for her alcohol and      drug abuse and social work followup was ordered.  2. Diabetes mellitus.  No changes were made to the patient's regimen.      The patient was initially put on glargine on admission for n.p.o.      status.  The patient was switched to sliding scale insulin when she      was eating again.  Hemoglobin A1c was 9.8 on June 21, 2008.  3. Hypertension.  Continue home meds.  4. Risk of alcohol withdrawal.  The patient was on CIWA protocol with      Ativan for duration of her stay.  5. GERD.  The patient was put on Protonix 40 mg IV q.22 h.  6. Smoker.  The patient was given nicotine patch.  7. History of MRSA.  Wounds appeared to be well healing.  The patient      was not placed on contact  precautions for duration of stay.  8. Vaginal discharge.  The patient was given Diflucan orally in the      hospital prior to discharge and was given a 7-day script for a      Flagyl outpatient.   DISCHARGE LABORATORIES:  Sodium 136, potassium 3.8, chloride 105, bicarb  26,  glucose 218, BUN 4, creatinine 0.8.  CBC:  White blood cell count  6.5, red blood cells 4.29, hemoglobin 13.7, hematocrit 40.7, and  platelets 170.  Lipid Panel:  HDL 33, all others within normal limits.  Drug screen positive for cocaine.      Mariea Stable, MD  Electronically Signed      Chauncey Reading, D.O.  Electronically Signed    MA/MEDQ  D:  06/24/2008  T:  06/25/2008  Job:  [id number]   cc:   Olene Craven, M.D.

## 2010-11-25 NOTE — Discharge Summary (Signed)
[name], [name]            ACCOUNT NO.:  [id number]   MEDICAL RECORD NO.:  [id number]          PATIENT TYPE:  INP   LOCATION:  5531                         FACILITY:  [facility]   PHYSICIAN:  Fransisco Hertz, M.D.  DATE OF BIRTH:  1956/03/26   DATE OF ADMISSION:  03/19/2008  DATE OF DISCHARGE:  03/24/2008                               DISCHARGE SUMMARY   CONSULTANTS:  Wound Care, Velora Heckler, MD.   DISCHARGE DIAGNOSES:  1. Gluteal abscesses.  2. Diabetes.  3. Hypertension.  4. History of hospitalization for cellulitis and abscess of legs and      methicillin-resistant Staphylococcus aureus positive left thigh and      labial ulcer.  5. History of hospitalization for diabetic ketoacidosis x2.  6. Cocaine abuse.  7. Gastroesophageal reflux disease.  8. Alcohol abuse.   DISCHARGE MEDICATIONS:  1. NovoLog 70/30, 55 units subcutaneous in the morning and 30 units      subcutaneous at night.  2. Lisinopril 10 mg p.o. daily.  3. Bactrim 160/800 mg p.o. b.i.d. for 6 weeks.  4. Bactroban Nasal 2% ointment, apply to each nostril 2 times a day      for 7 days.  5. Percocet 5/325 mg 1-2 tablets p.o. q.6 h. as needed for pain.   CONDITION AT DISCHARGE:  The patient's ulcers were healing well and  there were no signs of infection.  Home health RN was arranged for home  dressings.  The patient will follow up with [facility] in  2 weeks and follow up at [facility] on April 17, 2008, at  10:50 a.m.   PROCEDURES:  Incision and drainage and debridement of abscess on left  buttocks, skin and subcutaneous tissue, 3 cm in diameter.  Incision and  drainage and debridement of skin and subcutaneous tissue on second  lesion which was on left buttocks, it was 2 cm in diameter.  Incision  and drainage and debridement of abscess on right buttocks, 3 cm in  diameter.  Incision and drainage and debridement of abscess on right  buttocks, second lesion was 4 cm in  diameter.  There was minimal blood  loss.  Preparation was done with Betadine.  At the end of procedures,  her wounds were packed with Betadine-soaked sponges.  Dry gauze  dressings and pads were applied to bilateral buttocks.  The patient was  awakened from anesthesia and brought to the recovery room in stable  condition.  The patient tolerated the procedure well.   HISTORY OF PRESENT ILLNESS:  On admission, a 55 year old African  American female with past medical history of diabetes with recent DKA  and hypertension, complained of multiple boils on her buttocks that were  progressively getting worse over the last 3-1/2 weeks.  She initially  had 1 pea-sized bump on her left buttock.  After few days, she had 3-4  bumps on her buttocks.  The bumps progressively grew to their present  size, the boils nearly covered her buttocks and prevented her from  sitting down.  She had been cleaning the boils with gauze and sitting in  warm water baths and salt baths and putting cream on the boils.  Every  now and then yellowish discharge with blood would come out from the  boils.  The boils were painful, 8/10 in intensity.  There is no pain  while defecating or change in her bowels.  Stool did not come out of her  boils.  She had fever and chills since 1 week before admission.  She had  no other lesions elsewhere.  In May, she was admitted to the hospital  for surgical drainage of abscesses which healed after few weeks with the  help of home health nurse which changed the dressing every 2 days.  She  had these boils since she was 19 and reoccur every 2-3 times a year.  She also complains of chronic cough with yellow-green sputum of 65-month  duration, shortness of breath intermittently, weakness, nausea, anorexia  for 4 days, and vaginal itching and discharge.   PHYSICAL EXAMINATION:  VITAL SIGNS:  On admission temperature 98.5,  blood pressure 139/80, pulse 14, respiratory rate 22, and oxygen   saturation of 98%.  GENERAL:  Lying on the left side, uncomfortable.  EYES:  EOMI, PERRLA.  ENT:  No pharyngeal congestion.  NECK:  No JVD.  Supple.  No carotid bruits.  RESPIRATION:  Clear to auscultation x2.  No wheezes, no rhonchi, and no  crackles.  CARDIOVASCULAR:  Regular rate and rhythm.  No murmurs, rubs, or gallops.  GI:  Bowel sounds positive, soft, and depressible.  No hepatomegaly.  Nontender.  EXTREMITIES:  No edema.  Dorsalis pedis and posterior tibialis pulses  +2.  GU:  No CVA tenderness.  SKIN:  Left gluteal region- opened boil 5 mm diameter with drainage, and  a indurated boil that had not ruptured.  Right gluteal region-  4-mm  diameter boil with drainage, tender to touch, and erythematous.  LYMPH:  No lymphadenopathy.  MUSCULOSKELETAL:  5/5 strength in all extremities.  NEUROLOGIC:  Alert, active, and oriented.  Cranial nerves II through XII  intact.  No focal deficits.   LABORATORY DATA:  White blood cells 10.9, hemoglobin 13.6, hematocrit  39.4, and platelets 245.  Sodium 136, potassium 3.6, chloride 102,  bicarbonate 29, BUN 4, creatinine 0.62, and glucose 282.  Urine drug  screen came back positive for cocaine and benzodiazepine.  Hemoglobin  A1c was 12.9.  VDRL was nonreactive.  Wound culture grew Gram positive  cocci in pairs.  Bilirubin 0.7, alkaline phosphatase 48, AST 20, ALT 18,  and protein 7.2, albumin 2.7, and calcium 8.7.   HOSPITAL COURSE:  1. Gluteal ulcers:  Once the patient was evaluated in the emergency      room, she was admitted and cultures of the wound were taken as well      as blood cultures x 2 and urine culture.  We also checked      urinalysis, VDRL, and HIV antibody screen.  HIV test was negative.      RPR was nonreactive.  Wound cultures grew methicillin-resistant      staph aureus.  Blood cultures showed no growth.  Urine culture did      not show any evidence of infection.  Gram-stain presented abundant      white blood cells  with polymorphonuclears and mononuclears, and few      Gram-positive cocci in pairs and in clusters.  Anaerobe cultures      were negative. Pain was adequately controlled during the hospital  course.  We consulted Wound Care which recommended possible I&D      done by surgery.  Surgery was consulted and I&D was done. The      patient did not have any complications during the procedure.  After      the surgery, wound were dressed as per Wound Care with wet-to-dry      changes. Patient was placed on proper antibotic coverage.      Arrangment for Home health nurse dressing changes were done while      at the hospital. Throughout course of hospitalization, the patient      stayed afebrile.  The patient developed a mild leukocytosis of      11.9, but never greater than that.  The patient responded well to      treatment during the course of her hospital stay.  The patient's      pain subsided throughout the course of her hospital stay, and on      discharge there were no signs of infection.  Patient was discharged      on Bactrim DS for 6 weeks, and after 6 weeks she would start      Bactroban Nasal ointment twice a day in each nostril for 7 days.      The patient was told to follow up on her appointments with [facility] and [facility].  2. Diabetes:  On admission, the patient was placed on moderate sliding      scale of NovoLog and was given 70/30 insulin 55 units in the a.m.      and 30 units at night.  CBGs were checked before meals and at      night.  Continually monitored CBGs and made adequate changes to      insulin as needed during the hospital course.  The patient's blood      glucose levels fluctuated during the course of her stay in the      hospital ranging from 142-293.  On discharge, the patient's glucose      was at 183.  Upon discharge, the patient was placed on her home      medication dose of insulin.  The patient would follow up at [facility] for further management.  3. Alcohol and cocaine abuse.  Consulted Child psychotherapist for counseling.      Social worker talked to the patient and educated her on options      towards cessation of alcohol and cocaine abuse.  The patient was in      agreement to the counseling and expressed a desire to stop upon      discharge from hospital.  4. Hypertension.  The patient's hypertension was managed with      lisinopril 10 mg p.o. daily during the course of the hospital stay.      There was no need for titration of blood pressure medication.      Blood pressure ranged from 139/80-130/78.  5. GERD.  The patient was placed on Protonix during the course of the      hospital stay.  There were no complaints during the hospital stay      of gastroesophageal reflux.  6. Cough.  The patient had minimal cough during the course of the      hospital stay.  Cough did not progress, and by the time of      discharge  had subsided considerably.  7. Alcohol abuse.  The patient on March 20, 2008, was placed on      CIWA protocol.  She was given thiamine and folate.  She was also      given Ativan for withdrawal symptoms.   DISCHARGE VITALS:  Temperature 98.9, blood pressure 138/84, pulse 106,  respirations 20, and oxygen saturation 97% on room air.   LABORATORY DATA:  White blood cell 9.3, hemoglobin 12.3, hematocrit 123,  platelets 277.  Sodium 142, potassium 3.5, chloride 106, bicarbonate 29,  BUN 3, creatinine 0.65, and glucose 183.      Danne Harbor, MD  Electronically Signed      Fransisco Hertz, M.D.  Electronically Signed    RV/MEDQ  D:  04/02/2008  T:  04/03/2008  Job:  [id number]   cc:   Velora Heckler, MD

## 2010-11-25 NOTE — Discharge Summary (Signed)
[place]  Patient:    [name]                    MRN: [id number] Adm. Date:  [id number] Disc. Date: 11/01/99 Attending:  Phifer, Harriett Sine [place] Dictator:   Karlene Einstein CC:         Bing Neighbors. Clearance Coots, M.D.             [facility]                           Discharge Summary  DISCHARGE DIAGNOSES: 1. Diabetic ketoacidosis. 2. Trichomoniasis. 3. Right Bartholins gland cyst. 4. Gastroesophageal reflux disease. 5. Bacterial vaginosis. 6. History of cocaine abuse. 7. Alcohol abuse.  PROCEDURES: 1. Chest x-ray:  No acute disease. 2. Incision and drainage of right Bartholins gland cyst in OB/GYN clinic.  CONSULTATIONS:  Charles A. Clearance Coots, M.D. (OB/GYN)  DISCHARGE MEDICATIONS: 1. Pepcid 20 mg p.o. q.d. 2. Flagyl 0.5 g p.o. q.d. for four days. 3. Insulin 70/30 44 units in the morning and 28 units before dinner.  FOLLOW-UP:  [facility] with Dr. Kerry Dory on Nov 23, 1999 at 1:50 p.m. to follow up on the diabetes and adjust insulin levels.  HISTORY OF PRESENT ILLNESS:  A 55 year old African-American female with diabetes mellitus diagnosed two years ago was on Humulin until December 2000 when she presented with complaints of weakness for two months, yeast infection for 1-1/2 weeks, nausea, constipation, polydipsia, polyuria, productive cough with yellow-green sputum for one month.  She admits to weight loss; however, unable to recall how much.  Denies vomiting, fever, chest pain, pleurisy, or abdominal pain.  Last bowel movement was three days ago.  Stool was solid.  No hematochezia or melena.  No dysuria.  She has not been eating much.  REVIEW OF SYSTEMS:  Positive for chills, frontal headaches, heartburn, blurring and scotomata on the right eye, lightheadedness, and intermittent shortness of breath.  PHYSICAL EXAMINATION:  VITAL SIGNS:  Temperature 98.6, blood pressure 117/81, pulse 100, respiratory rate  22, pulse oximetry 98% on room air.  GENERAL:  No acute distress.  Obese.  SKIN:  No rashes.  HEENT:  Normocephalic, atraumatic.  Pupils are equal, round and reactive to light and accommodation.  Extraocular muscles intact, anicteric.  No pallor. Oropharynx clear.  Malodorous breath and poor dentition.  Tympanic membranes intact.  Funduscopic within normal limits.  NECK:  Supple.  No lymphadenopathy.  LUNGS:  Clear to auscultation bilaterally.  HEART:  Regular rate and rhythm.  Normal S1 and S2.  No murmurs, gallops, or rubs.  ABDOMEN:  Soft, nontender, nondistended.  Positive bowel sounds.  No organomegaly or masses.  PELVIC:  Pelvic examination by the emergency department P.A. showed mild cervical discharge, greenish in color.  No blood.  No cervical motion tenderness.  No adnexal tenderness.  No adnexal masses.  GENITALIA:  Right Bartholins gland cyst about 2 cm in diameter.  Positive light green discharge.  EXTREMITIES:  +2 pulses.  Warm, no edema.  NEUROLOGIC:  Alert and oriented x 3.  No focal deficits.  LABORATORY DATA:  White count 11.4, hemoglobin 14.4, platelets 210, MCV 92, ANC 8.1.  Sodium 131, potassium 3.5, chloride 99, bicarbonate 19, BUN 7, creatinine 1.1, glucose ______, anion gap 13, calcium 8.7, total protein 7.7, albumin 3, SGOT 50, SGPT 66, alkaline phosphatase 57, total bilirubin 0.8. Urine pregnancy test negative.  Serum acetone small.  Urinalysis:  Glucose greater than 1000, greater than 80 ketones, nitrite negative, small leukocyte esterase, WBC 11-20, RBC 11-20, rare Trichomonas.  Room air ABG:  pH 7.29, PCO2 26, PO2 98, GC/GU cultures pending wet prep, moderate Trichomonas, glue cells moderate.  HOSPITAL COURSE: #1 - DKA:  The DKA was corrected with IV fluids, insulin drip, and potassium replacement.  Upon correction, her CBGs were followed and insulin dosage was adjusted and at discharge the insulin dose was 70/30 44 units q.a.m. and 28 units  q.p.m.  She received diabetic education while in the hospital. Hemoglobin A1C and C peptide is pending.  She will follow up with myself on Nov 23, 1999 in the outpatient clinic for the diabetes and to adjust the insulin dose if needed.  #2 - TRICHOMONIASIS AND BACTERIAL VAGINOSIS:  The patient was started on Flagyl 0.5 g p.o. b.i.d. for seven days.  At discharge, four days of the course are left and she was discharged on that dose.  #3 - RIGHT BARTHOLINS GLAND CYST:  The case was discussed with Dr. Clearance Coots (OB/GYN) and he stated that treatment is incision and drainage.  She is scheduled to go through I&D at 2 p.m. on November 01, 1999 in the OB/GYN clinic right after discharge.  In the meanwhile, she was covered with Augmentin for possible gram-negative anaerobes and skin flora.  #4 - GERD:  The patient has a history of duodenal ulcer in 1999 by barium swallow.  She was started on Pepcid 20 mg p.o. q.d. and was discharged on the same dosing.  #5 - HISTORY OF COCAINE ABUSE:  The patient did admit to cocaine use; however, she stated that she quit one year ago.  #6 - ALCOHOL ABUSE:  The patient admitted to intake of three to four beers every day; however, did not develop any withdrawal symptoms while in the hospital.  The phone numbers for [employer] and Alcoholic Anonymous were given to her to seek help if she is interested.  She was tested for HIV upon consent and it was negative. DD:  10/31/99 TD:  10/31/99 Job: [id number]

## 2010-12-20 ENCOUNTER — Other Ambulatory Visit: Payer: Self-pay | Admitting: Internal Medicine

## 2010-12-21 NOTE — Telephone Encounter (Signed)
Last seen here in March 2011.   Six no shows since then.  No labs since 3-11 and those were not good. She needs to be seen.  We do not even have insulin on our EPIC med list. Has she been getting insulin somewhere else?  Has she even been taking insulin this year?

## 2011-01-26 ENCOUNTER — Encounter: Payer: Self-pay | Admitting: Internal Medicine

## 2011-02-07 ENCOUNTER — Encounter: Payer: Self-pay | Admitting: Internal Medicine

## 2011-03-27 ENCOUNTER — Other Ambulatory Visit: Payer: Self-pay | Admitting: Internal Medicine

## 2011-04-04 LAB — CBC
HCT: 36.6
HCT: 37.2
HCT: 38.6
HCT: 38.8
Hemoglobin: 12.4
Hemoglobin: 12.6
Hemoglobin: 13.2
Hemoglobin: 13.2
Hemoglobin: 13.3
MCHC: 34
MCHC: 34
MCHC: 34.1
MCHC: 35.2
MCHC: 35
MCV: 92.1
RBC: 4.07
RBC: 4.14
RBC: 4.22
RBC: 4.23
RBC: 4.97
RDW: 13.8
RDW: 13.8
RDW: 14
RDW: 12.9
WBC: 14.1 — ABNORMAL HIGH
WBC: 8.9

## 2011-04-04 LAB — BASIC METABOLIC PANEL
BUN: 10
BUN: 11
BUN: 6
BUN: 7
BUN: 8
CO2: 12 — ABNORMAL LOW
CO2: 18 — ABNORMAL LOW
CO2: 21
CO2: 21
CO2: 21
CO2: 23
CO2: 26
CO2: 27
Calcium: 8.3 — ABNORMAL LOW
Calcium: 8.3 — ABNORMAL LOW
Calcium: 8.4
Calcium: 8.4
Calcium: 8.4
Calcium: 9.6
Chloride: 102
Chloride: 103
Chloride: 105
Chloride: 107
Chloride: 109
Creatinine, Ser: 0.7
Creatinine, Ser: 0.71
GFR calc Af Amer: >60
GFR calc Af Amer: >60
GFR calc Af Amer: >60
GFR calc Af Amer: >60
GFR calc Af Amer: >60
GFR calc Af Amer: >60
GFR calc non Af Amer: 46 — ABNORMAL LOW
GFR calc non Af Amer: 50 — ABNORMAL LOW
GFR calc non Af Amer: >60
GFR calc non Af Amer: >60
GFR calc non Af Amer: >60
GFR calc non Af Amer: >60
Glucose, Bld: 197 — ABNORMAL HIGH
Glucose, Bld: 215 — ABNORMAL HIGH
Glucose, Bld: 326 — ABNORMAL HIGH
Glucose, Bld: 342 — ABNORMAL HIGH
Glucose, Bld: 345 — ABNORMAL HIGH
Glucose, Bld: 384 — ABNORMAL HIGH
Potassium: 3.1 — ABNORMAL LOW
Potassium: 3.3 — ABNORMAL LOW
Potassium: 3.6
Potassium: 3.8
Potassium: 3.8
Potassium: 4.2
Potassium: 4.2
Potassium: 4.5
Potassium: 4.5
Sodium: 132 — ABNORMAL LOW
Sodium: 132 — ABNORMAL LOW
Sodium: 133 — ABNORMAL LOW
Sodium: 133 — ABNORMAL LOW
Sodium: 135
Sodium: 136
Sodium: 137
Sodium: 138
Sodium: 138

## 2011-04-04 LAB — CARDIAC PANEL(CRET KIN+CKTOT+MB+TROPI)
CK, MB: 1.3
CK, MB: 1.4
CK, MB: 1.7
CK, MB: 2.4
Relative Index: INVALID
Total CK: 80
Total CK: 90
Troponin I: 0.01
Troponin I: 0.01
Troponin I: 0.04

## 2011-04-04 LAB — URINE MICROSCOPIC-ADD ON

## 2011-04-04 LAB — COMPREHENSIVE METABOLIC PANEL
ALT: 18
AST: 15
Calcium: 9.5
GFR calc Af Amer: 45 — ABNORMAL LOW
Sodium: 135
Total Protein: 8.7 — ABNORMAL HIGH

## 2011-04-04 LAB — URINALYSIS, ROUTINE W REFLEX MICROSCOPIC
Glucose, UA: >1000 — AB
Leukocytes, UA: NEGATIVE
Protein, ur: 30 — AB
pH: 5.5

## 2011-04-04 LAB — DIFFERENTIAL
Basophils Relative: 2 — ABNORMAL HIGH
Eosinophils Relative: 0
Lymphs Abs: 2.2
Monocytes Absolute: 1.1 — ABNORMAL HIGH

## 2011-04-04 LAB — WOUND CULTURE

## 2011-04-04 LAB — PREGNANCY, URINE: Preg Test, Ur: NEGATIVE

## 2011-04-04 LAB — CULTURE, BLOOD (ROUTINE X 2)
Culture: NO GROWTH
Culture: NO GROWTH

## 2011-04-04 LAB — DRUGS OF ABUSE SCREEN W/O ALC, ROUTINE URINE
Amphetamine Screen, Ur: NEGATIVE
Barbiturate Quant, Ur: NEGATIVE
Cocaine Metabolites: POSITIVE — AB
Creatinine,U: 53.6
Marijuana Metabolite: NEGATIVE

## 2011-04-04 LAB — COCAINE, URINE, CONFIRMATION: Benzoylecgonine GC/MS Conf: 360 ng/mL

## 2011-04-04 LAB — KETONES, QUALITATIVE

## 2011-04-10 LAB — GLUCOSE, CAPILLARY
Glucose-Capillary: 137 — ABNORMAL HIGH
Glucose-Capillary: 185 — ABNORMAL HIGH
Glucose-Capillary: 164 — ABNORMAL HIGH

## 2011-04-10 LAB — CBC
Hemoglobin: 12.3
RDW: 13.8

## 2011-04-10 LAB — BASIC METABOLIC PANEL
Calcium: 8.8
GFR calc Af Amer: >60
GFR calc non Af Amer: >60
Glucose, Bld: 183 — ABNORMAL HIGH
Sodium: 142

## 2011-04-12 LAB — GLUCOSE, CAPILLARY
Glucose-Capillary: 130 — ABNORMAL HIGH
Glucose-Capillary: 196 — ABNORMAL HIGH
Glucose-Capillary: 200 — ABNORMAL HIGH
Glucose-Capillary: 218 — ABNORMAL HIGH
Glucose-Capillary: 248 — ABNORMAL HIGH
Glucose-Capillary: 259 — ABNORMAL HIGH
Glucose-Capillary: 266 — ABNORMAL HIGH
Glucose-Capillary: 288 — ABNORMAL HIGH
Glucose-Capillary: 308 — ABNORMAL HIGH
Glucose-Capillary: 434 — ABNORMAL HIGH
Glucose-Capillary: 77

## 2011-04-12 LAB — URINALYSIS, ROUTINE W REFLEX MICROSCOPIC
Bilirubin Urine: NEGATIVE
Glucose, UA: >1000 — AB
Ketones, ur: NEGATIVE
pH: 5.5

## 2011-04-12 LAB — URINE DRUGS OF ABUSE SCREEN W ALC, ROUTINE (REF LAB)
Barbiturate Quant, Ur: NEGATIVE
Cocaine Metabolites: POSITIVE — AB
Marijuana Metabolite: NEGATIVE
Phencyclidine (PCP): NEGATIVE

## 2011-04-12 LAB — CBC
HCT: 38.4
Hemoglobin: 11.9 — ABNORMAL LOW
Hemoglobin: 12.2
Hemoglobin: 13.1
MCHC: 33.5
MCHC: 34.4
MCV: 91.9
MCV: 92.9
Platelets: 233
Platelets: 275
RBC: 3.8 — ABNORMAL LOW
RBC: 3.91
RDW: 13.2
RDW: 13.6
WBC: 10.6 — ABNORMAL HIGH
WBC: 11.9 — ABNORMAL HIGH

## 2011-04-12 LAB — URINE CULTURE: Special Requests: NEGATIVE

## 2011-04-12 LAB — CULTURE, BLOOD (ROUTINE X 2)
Culture: NO GROWTH
Culture: NO GROWTH

## 2011-04-12 LAB — WOUND CULTURE

## 2011-04-12 LAB — URINE MICROSCOPIC-ADD ON

## 2011-04-12 LAB — BASIC METABOLIC PANEL
BUN: 4 — ABNORMAL LOW
BUN: 7
CO2: 26
CO2: 30
Calcium: 8.5
Calcium: 8.5
Chloride: 102
Chloride: 99
Creatinine, Ser: 0.68
Creatinine, Ser: 0.7
Creatinine, Ser: 0.78
GFR calc Af Amer: >60
GFR calc Af Amer: >60
GFR calc non Af Amer: >60
GFR calc non Af Amer: >60
Glucose, Bld: 142 — ABNORMAL HIGH
Potassium: 4.2
Sodium: 135
Sodium: 141

## 2011-04-12 LAB — COMPREHENSIVE METABOLIC PANEL
ALT: 18
AST: 20
Calcium: 8.7
GFR calc Af Amer: >60
Glucose, Bld: 282 — ABNORMAL HIGH
Sodium: 136
Total Protein: 7.2

## 2011-04-12 LAB — GRAM STAIN

## 2011-04-12 LAB — DIFFERENTIAL
Eosinophils Absolute: 0.1
Lymphocytes Relative: 23
Lymphocytes Relative: 31
Lymphs Abs: 2.5
Lymphs Abs: 3.2
Monocytes Absolute: 0.9
Monocytes Relative: 6
Monocytes Relative: 8
Neutro Abs: 6.4
Neutrophils Relative %: 60
Neutrophils Relative %: 70

## 2011-04-12 LAB — CULTURE, ROUTINE-ABSCESS

## 2011-04-12 LAB — ANAEROBIC CULTURE: Gram Stain: NONE SEEN

## 2011-04-12 LAB — HEMOGLOBIN A1C: Mean Plasma Glucose: 324

## 2011-04-12 LAB — BENZODIAZEPINE, QUANTITATIVE, URINE
Alprazolam (GC/LC/MS), ur confirm: NEGATIVE
Flurazepam GC/MS Conf: NEGATIVE
Nordiazepam GC/MS Conf: NEGATIVE

## 2011-04-12 LAB — VANCOMYCIN, TROUGH: Vancomycin Tr: 9.2

## 2011-04-13 LAB — COMPREHENSIVE METABOLIC PANEL
ALT: 13 U/L (ref 0–35)
AST: 18 U/L (ref 0–37)
Alkaline Phosphatase: 45 U/L (ref 39–117)
CO2: 24 mEq/L (ref 19–32)
Calcium: 8.9 mg/dL (ref 8.4–10.5)
Chloride: 102 mEq/L (ref 96–112)
Chloride: 105 mEq/L (ref 96–112)
Creatinine, Ser: 0.75 mg/dL (ref 0.4–1.2)
GFR calc Af Amer: >60 mL/min (ref >60)
GFR calc non Af Amer: >60 mL/min (ref >60)
Glucose, Bld: 218 mg/dL — ABNORMAL HIGH (ref 70–99)
Glucose, Bld: 395 mg/dL — ABNORMAL HIGH (ref 70–99)
Potassium: 3.8 mEq/L (ref 3.5–5.1)
Sodium: 136 mEq/L (ref 135–145)
Total Bilirubin: 0.7 mg/dL (ref 0.3–1.2)
Total Protein: 6.1 g/dL (ref 6.0–8.3)

## 2011-04-13 LAB — POCT I-STAT, CHEM 8
Creatinine, Ser: 0.7 mg/dL (ref 0.4–1.2)
Glucose, Bld: 400 mg/dL — ABNORMAL HIGH (ref 70–99)
HCT: 47 % — ABNORMAL HIGH (ref 36.0–46.0)
Hemoglobin: 16 g/dL — ABNORMAL HIGH (ref 12.0–15.0)
TCO2: 24 mmol/L (ref 0–100)

## 2011-04-13 LAB — DIFFERENTIAL
Basophils Absolute: 0 10*3/uL (ref 0.0–0.1)
Eosinophils Absolute: 0.1 10*3/uL (ref 0.0–0.7)
Eosinophils Relative: 1 % (ref 0–5)
Lymphocytes Relative: 30 % (ref 12–46)
Lymphs Abs: 2.9 10*3/uL (ref 0.7–4.0)
Neutrophils Relative %: 62 % (ref 43–77)

## 2011-04-13 LAB — GLUCOSE, CAPILLARY
Glucose-Capillary: 140 mg/dL — ABNORMAL HIGH (ref 70–99)
Glucose-Capillary: 163 mg/dL — ABNORMAL HIGH (ref 70–99)
Glucose-Capillary: 221 mg/dL — ABNORMAL HIGH (ref 70–99)
Glucose-Capillary: 309 mg/dL — ABNORMAL HIGH (ref 70–99)
Glucose-Capillary: 318 mg/dL — ABNORMAL HIGH (ref 70–99)
Glucose-Capillary: 352 mg/dL — ABNORMAL HIGH (ref 70–99)

## 2011-04-13 LAB — BASIC METABOLIC PANEL
Chloride: 105 mEq/L (ref 96–112)
Creatinine, Ser: 0.64 mg/dL (ref 0.4–1.2)
GFR calc Af Amer: >60 mL/min (ref >60)
Potassium: 3.6 mEq/L (ref 3.5–5.1)
Sodium: 136 mEq/L (ref 135–145)

## 2011-04-13 LAB — URINALYSIS, ROUTINE W REFLEX MICROSCOPIC
Bilirubin Urine: NEGATIVE
Glucose, UA: >1000 mg/dL — AB
Hgb urine dipstick: NEGATIVE
Protein, ur: NEGATIVE mg/dL
Urobilinogen, UA: 0.2 mg/dL (ref 0.0–1.0)

## 2011-04-13 LAB — CBC
HCT: 44 % (ref 36.0–46.0)
Hemoglobin: 13.7 g/dL (ref 12.0–15.0)
Hemoglobin: 15.1 g/dL — ABNORMAL HIGH (ref 12.0–15.0)
MCHC: 34.4 g/dL (ref 30.0–36.0)
MCV: 94.6 fL (ref 78.0–100.0)
RBC: 4.29 MIL/uL (ref 3.87–5.11)
RBC: 4.65 MIL/uL (ref 3.87–5.11)
RDW: 14.1 % (ref 11.5–15.5)
WBC: 6.5 10*3/uL (ref 4.0–10.5)
WBC: 9.8 10*3/uL (ref 4.0–10.5)

## 2011-04-13 LAB — LACTATE DEHYDROGENASE: LDH: 172 U/L (ref 94–250)

## 2011-04-13 LAB — URINE CULTURE: Colony Count: 50000

## 2011-04-13 LAB — LIPID PANEL: VLDL: 21 mg/dL (ref 0–40)

## 2011-04-13 LAB — URINE MICROSCOPIC-ADD ON

## 2011-04-13 LAB — HEMOGLOBIN A1C
Hgb A1c MFr Bld: 9.8 % — ABNORMAL HIGH (ref 4.6–6.1)
Mean Plasma Glucose: 235 mg/dL

## 2011-04-13 LAB — LIPASE, BLOOD: Lipase: 2810 U/L — ABNORMAL HIGH (ref 11–59)

## 2011-04-13 LAB — POCT CARDIAC MARKERS
CKMB, poc: 1.7 ng/mL (ref 1.0–8.0)
Myoglobin, poc: 57 ng/mL (ref 12–200)
Troponin i, poc: <0.05 ng/mL (ref 0.00–0.09)

## 2011-04-13 LAB — RAPID URINE DRUG SCREEN, HOSP PERFORMED: Barbiturates: NOT DETECTED

## 2011-05-15 ENCOUNTER — Observation Stay (HOSPITAL_COMMUNITY)
Admission: EM | Admit: 2011-05-15 | Discharge: 2011-05-16 | Disposition: A | Discharge status: Home / Self Care | Payer: Medicaid Other | Attending: Infectious Diseases | Admitting: Infectious Diseases

## 2011-05-15 ENCOUNTER — Emergency Department (HOSPITAL_COMMUNITY): Payer: Medicaid Other

## 2011-05-15 ENCOUNTER — Other Ambulatory Visit: Payer: Self-pay

## 2011-05-15 ENCOUNTER — Encounter: Payer: Self-pay | Admitting: Emergency Medicine

## 2011-05-15 DIAGNOSIS — R42 Dizziness and giddiness: Secondary | ICD-10-CM | POA: Insufficient documentation

## 2011-05-15 DIAGNOSIS — B373 Candidiasis of vulva and vagina: Secondary | ICD-10-CM | POA: Diagnosis present

## 2011-05-15 DIAGNOSIS — R079 Chest pain, unspecified: Principal | ICD-10-CM | POA: Insufficient documentation

## 2011-05-15 DIAGNOSIS — Z23 Encounter for immunization: Secondary | ICD-10-CM | POA: Insufficient documentation

## 2011-05-15 DIAGNOSIS — E119 Type 2 diabetes mellitus without complications: Secondary | ICD-10-CM | POA: Diagnosis present

## 2011-05-15 DIAGNOSIS — B3731 Acute candidiasis of vulva and vagina: Secondary | ICD-10-CM | POA: Insufficient documentation

## 2011-05-15 DIAGNOSIS — R0602 Shortness of breath: Secondary | ICD-10-CM | POA: Insufficient documentation

## 2011-05-15 DIAGNOSIS — R110 Nausea: Secondary | ICD-10-CM | POA: Insufficient documentation

## 2011-05-15 DIAGNOSIS — E785 Hyperlipidemia, unspecified: Secondary | ICD-10-CM | POA: Insufficient documentation

## 2011-05-15 DIAGNOSIS — R0789 Other chest pain: Secondary | ICD-10-CM | POA: Diagnosis present

## 2011-05-15 DIAGNOSIS — I10 Essential (primary) hypertension: Secondary | ICD-10-CM | POA: Insufficient documentation

## 2011-05-15 HISTORY — DX: Shortness of breath: R06.02

## 2011-05-15 HISTORY — DX: Essential (primary) hypertension: I10

## 2011-05-15 LAB — BASIC METABOLIC PANEL
CO2: 23 mEq/L (ref 19–32)
Calcium: 9.1 mg/dL (ref 8.4–10.5)
Creatinine, Ser: 0.58 mg/dL (ref 0.50–1.10)
GFR calc non Af Amer: >90 mL/min (ref >90)
Sodium: 135 mEq/L (ref 135–145)

## 2011-05-15 LAB — CBC
HCT: 41.9 % (ref 36.0–46.0)
Hemoglobin: 14.3 g/dL (ref 12.0–15.0)
MCH: 30.2 pg (ref 26.0–34.0)
MCHC: 32.7 g/dL (ref 30.0–36.0)
MCV: 92.6 fL (ref 78.0–100.0)
Platelets: 229 10*3/uL (ref 150–400)
RBC: 4.3 MIL/uL (ref 3.87–5.11)
RDW: 13.2 % (ref 11.5–15.5)
RDW: 13.4 % (ref 11.5–15.5)
WBC: 11.3 10*3/uL — ABNORMAL HIGH (ref 4.0–10.5)

## 2011-05-15 LAB — GLUCOSE, CAPILLARY
Glucose-Capillary: 276 mg/dL — ABNORMAL HIGH (ref 70–99)
Glucose-Capillary: 321 mg/dL — ABNORMAL HIGH (ref 70–99)
Glucose-Capillary: 274 mg/dL — ABNORMAL HIGH (ref 70–99)

## 2011-05-15 LAB — COMPREHENSIVE METABOLIC PANEL
ALT: 24 U/L (ref 0–35)
AST: 26 U/L (ref 0–37)
Calcium: 9.4 mg/dL (ref 8.4–10.5)
Sodium: 138 mEq/L (ref 135–145)
Total Protein: 8.3 g/dL (ref 6.0–8.3)

## 2011-05-15 LAB — RAPID URINE DRUG SCREEN, HOSP PERFORMED: Amphetamines: NOT DETECTED

## 2011-05-15 LAB — D-DIMER, QUANTITATIVE: D-Dimer, Quant: 3.43 ug/mL-FEU — ABNORMAL HIGH (ref 0.00–0.48)

## 2011-05-15 MED ORDER — NITROGLYCERIN 0.4 MG SL SUBL: 0.4000 mg | SUBLINGUAL_TABLET | SUBLINGUAL | Status: DC | PRN

## 2011-05-15 MED ORDER — ASPIRIN 325 MG PO TABS
325.0000 mg | ORAL_TABLET | ORAL | Status: AC
  Administered 2011-05-15: 325 mg via ORAL
  Filled 2011-05-15: qty 1

## 2011-05-15 MED ORDER — ENOXAPARIN SODIUM 40 MG/0.4ML ~~LOC~~ SOLN
40.0000 mg | SUBCUTANEOUS | Status: DC
  Administered 2011-05-15: 40 mg via SUBCUTANEOUS
  Filled 2011-05-15 (×2): qty 0.4

## 2011-05-15 MED ORDER — ONDANSETRON HCL 4 MG/2ML IJ SOLN
INTRAMUSCULAR | Status: AC
  Administered 2011-05-15: 10:00:00
  Filled 2011-05-15: qty 2

## 2011-05-15 MED ORDER — ZOLPIDEM TARTRATE 5 MG PO TABS: 5.0000 mg | ORAL_TABLET | Freq: Every evening | ORAL | Status: DC | PRN

## 2011-05-15 MED ORDER — THIAMINE HCL 100 MG/ML IJ SOLN
100.0000 mg | Freq: Every day | INTRAMUSCULAR | Status: DC
  Filled 2011-05-15: qty 2

## 2011-05-15 MED ORDER — ACETAMINOPHEN 325 MG PO TABS: 650.0000 mg | ORAL_TABLET | ORAL | Status: DC | PRN

## 2011-05-15 MED ORDER — THERA M PLUS PO TABS
1.0000 | ORAL_TABLET | Freq: Every day | ORAL | Status: DC
  Administered 2011-05-15 – 2011-05-16 (×2): 1 via ORAL
  Filled 2011-05-15 (×3): qty 1

## 2011-05-15 MED ORDER — MORPHINE SULFATE 4 MG/ML IJ SOLN
INTRAMUSCULAR | Status: AC
  Administered 2011-05-15: 10:00:00
  Filled 2011-05-15: qty 1

## 2011-05-15 MED ORDER — ASPIRIN EC 81 MG PO TBEC
81.0000 mg | DELAYED_RELEASE_TABLET | Freq: Every day | ORAL | Status: DC
  Administered 2011-05-16: 81 mg via ORAL
  Filled 2011-05-15: qty 1

## 2011-05-15 MED ORDER — SODIUM CHLORIDE 0.9 % IV SOLN: 250.0000 mL | INTRAVENOUS | Status: DC

## 2011-05-15 MED ORDER — NITROGLYCERIN 0.4 MG/SPRAY TL SOLN
1.0000 | Status: DC | PRN
  Filled 2011-05-15: qty 4.9

## 2011-05-15 MED ORDER — PANTOPRAZOLE SODIUM 40 MG PO TBEC
40.0000 mg | DELAYED_RELEASE_TABLET | Freq: Every day | ORAL | Status: DC
  Administered 2011-05-15 – 2011-05-16 (×2): 40 mg via ORAL
  Filled 2011-05-15 (×2): qty 1

## 2011-05-15 MED ORDER — IOHEXOL 300 MG/ML  SOLN
100.0000 mL | Freq: Once | INTRAMUSCULAR | Status: AC | PRN
  Administered 2011-05-15: 100 mL via INTRAVENOUS

## 2011-05-15 MED ORDER — INSULIN ASPART 100 UNIT/ML ~~LOC~~ SOLN
0.0000 [IU] | SUBCUTANEOUS | Status: DC
  Administered 2011-05-15: 3 [IU] via SUBCUTANEOUS
  Administered 2011-05-15: 7 [IU] via SUBCUTANEOUS
  Administered 2011-05-16: 2 [IU] via SUBCUTANEOUS
  Administered 2011-05-16: 7 [IU] via SUBCUTANEOUS
  Administered 2011-05-16: 2 [IU] via SUBCUTANEOUS
  Administered 2011-05-16: 5 [IU] via SUBCUTANEOUS
  Filled 2011-05-15 (×2): qty 3

## 2011-05-15 MED ORDER — ASPIRIN 300 MG RE SUPP
300.0000 mg | RECTAL | Status: AC
  Filled 2011-05-15: qty 1

## 2011-05-15 MED ORDER — ONDANSETRON HCL 4 MG/2ML IJ SOLN
4.0000 mg | Freq: Once | INTRAMUSCULAR | Status: AC
  Administered 2011-05-15: 4 mg via INTRAVENOUS
  Filled 2011-05-15: qty 2

## 2011-05-15 MED ORDER — VITAMIN B-1 100 MG PO TABS
100.0000 mg | ORAL_TABLET | Freq: Every day | ORAL | Status: DC
  Administered 2011-05-15 – 2011-05-16 (×2): 100 mg via ORAL
  Filled 2011-05-15 (×2): qty 1

## 2011-05-15 MED ORDER — NITROGLYCERIN 0.4 MG/SPRAY TL SOLN: 1.0000 | Status: DC | PRN

## 2011-05-15 MED ORDER — SODIUM CHLORIDE 0.9 % IJ SOLN: 3.0000 mL | INTRAMUSCULAR | Status: DC | PRN

## 2011-05-15 MED ORDER — MORPHINE SULFATE 4 MG/ML IJ SOLN
4.0000 mg | INTRAMUSCULAR | Status: DC | PRN
  Administered 2011-05-15 – 2011-05-16 (×5): 4 mg via INTRAVENOUS
  Filled 2011-05-15 (×5): qty 1

## 2011-05-15 MED ORDER — ASPIRIN 81 MG PO CHEW
324.0000 mg | CHEWABLE_TABLET | ORAL | Status: AC
  Administered 2011-05-15: 324 mg via ORAL
  Filled 2011-05-15: qty 4

## 2011-05-15 MED ORDER — LISINOPRIL 20 MG PO TABS
20.0000 mg | ORAL_TABLET | Freq: Every day | ORAL | Status: DC
  Administered 2011-05-15 – 2011-05-16 (×2): 20 mg via ORAL
  Filled 2011-05-15 (×2): qty 1

## 2011-05-15 MED ORDER — FOLIC ACID 1 MG PO TABS
1.0000 mg | ORAL_TABLET | Freq: Every day | ORAL | Status: DC
  Administered 2011-05-15 – 2011-05-16 (×2): 1 mg via ORAL
  Filled 2011-05-15 (×2): qty 1

## 2011-05-15 MED ORDER — NICOTINE 14 MG/24HR TD PT24
14.0000 mg | MEDICATED_PATCH | Freq: Every day | TRANSDERMAL | Status: DC
  Administered 2011-05-15 – 2011-05-16 (×2): 14 mg via TRANSDERMAL
  Filled 2011-05-15 (×3): qty 1

## 2011-05-15 MED ORDER — SODIUM CHLORIDE 0.9 % IJ SOLN
3.0000 mL | Freq: Two times a day (BID) | INTRAMUSCULAR | Status: DC
  Administered 2011-05-15: 3 mL via INTRAVENOUS

## 2011-05-15 MED ORDER — LORAZEPAM 1 MG PO TABS: 1.0000 mg | ORAL_TABLET | Freq: Four times a day (QID) | ORAL | Status: DC | PRN

## 2011-05-15 MED ORDER — MORPHINE SULFATE 4 MG/ML IJ SOLN
4.0000 mg | Freq: Once | INTRAMUSCULAR | Status: AC
  Administered 2011-05-15: 4 mg via INTRAVENOUS
  Filled 2011-05-15: qty 1

## 2011-05-15 MED ORDER — ONDANSETRON HCL 4 MG/2ML IJ SOLN: 4.0000 mg | Freq: Four times a day (QID) | INTRAMUSCULAR | Status: DC | PRN

## 2011-05-15 MED ORDER — INSULIN ASPART PROT & ASPART (70-30 MIX) 100 UNIT/ML ~~LOC~~ SUSP
15.0000 [IU] | Freq: Two times a day (BID) | SUBCUTANEOUS | Status: DC
  Administered 2011-05-15 – 2011-05-16 (×2): 15 [IU] via SUBCUTANEOUS
  Filled 2011-05-15 (×2): qty 3

## 2011-05-15 MED ORDER — LORAZEPAM 2 MG/ML IJ SOLN: 1.0000 mg | Freq: Four times a day (QID) | INTRAMUSCULAR | Status: DC | PRN

## 2011-05-15 NOTE — ED Provider Notes (Signed)
History     CSN: [id number] Arrival date & time: 05/15/2011  5:08 AM   First MD Initiated Contact with Patient 05/15/11 [id number]      Chief Complaint  Patient presents with  . Chest Pain    (Consider location/radiation/quality/duration/timing/severity/associated sxs/prior treatment) Patient is a 55 y.o. female presenting with chest pain. The history is provided by the patient.  Chest Pain The chest pain began yesterday. Chest pain occurs constantly. The chest pain is worsening. The pain is associated with breathing, drug use and exertion. At its most intense, the pain is at 10/10. The quality of the pain is described as aching and sharp. The pain radiates to the upper back, left neck, left shoulder and left arm. Chest pain is worsened by deep breathing. Primary symptoms include shortness of breath, cough, nausea and dizziness. Pertinent negatives for primary symptoms include no fever, no syncope, no wheezing, no palpitations, no abdominal pain and no vomiting.  Dizziness also occurs with nausea. Dizziness does not occur with vomiting.  She tried NSAIDs and aspirin for the symptoms. Risk factors include substance abuse, smoking/tobacco exposure, sedentary lifestyle, obesity and lack of exercise.  Her past medical history is significant for diabetes and hypertension.   Pt sates chest pain began suddenly. States smoked crack cocaine after chest pain started to minimize the pain. States nothing is relieving the pain. Denies previous similar symptoms. Pain worsened with movement, deep breathing.   Past Medical History  Diagnosis Date  . Diabetes mellitus   . Hypertension     History reviewed. No pertinent past surgical history.  History reviewed. No pertinent family history.  History  Substance Use Topics  . Smoking status: Not on file  . Smokeless tobacco: Not on file  . Alcohol Use: Yes    OB History    Grav Para Term Preterm Abortions TAB SAB Ect Mult Living                   Review of Systems  Constitutional: Negative for fever, chills and unexpected weight change.  HENT: Negative.   Respiratory: Positive for cough, chest tightness and shortness of breath. Negative for wheezing.   Cardiovascular: Positive for chest pain. Negative for palpitations, leg swelling and syncope.  Gastrointestinal: Positive for nausea. Negative for vomiting and abdominal pain.  Genitourinary: Negative for dysuria and flank pain.  Musculoskeletal: Negative for myalgias and back pain.  Skin: Negative.   Neurological: Positive for dizziness.    Allergies  Review of patient's allergies indicates no known allergies.  Home Medications   Current Outpatient Rx  Name Route Sig Dispense Refill  . GOODY HEADACHE PO Oral Take 1 packet by mouth daily as needed. For pain     . IBUPROFEN 400 MG PO TABS Oral Take 400-800 mg by mouth every 6 (six) hours as needed. For pain     . INSULIN ASPART PROT & ASPART (70-30) 100 UNIT/ML [place] SUSP Subcutaneous Inject 25-35 Units into the skin 2 (two) times daily with a meal. 35 units in the morning and 25 units in the evening     . LISINOPRIL 20 MG PO TABS Oral Take 20 mg by mouth daily.        BP 173/79  Pulse 93  Temp(Src) 98.3 F (36.8 C) (Oral)  SpO2 97%  Physical Exam  Constitutional: She appears well-developed and well-nourished. She appears distressed.       obese  HENT:  Head: Normocephalic and atraumatic.  Neck: Neck supple.  Cardiovascular: Normal  rate, regular rhythm and normal heart sounds.  Exam reveals no gallop and no friction rub.   No murmur heard. Pulmonary/Chest: Effort normal and breath sounds normal. She has no wheezes. She has no rales. She exhibits no tenderness.  Abdominal: Soft. Bowel sounds are normal. She exhibits no distension. There is no tenderness.  Musculoskeletal: She exhibits no edema.  Skin: Skin is warm and dry. She is not diaphoretic.    ED Course  Procedures (including critical care time)  Pt with  continued chest pain, SOB. Negative first set of cardiac enzymes. Negative ECG. Negative CT angio. Will admit for rule out. Pt has risk factors for ACS.    Date: 05/15/2011  Rate: 91  Rhythm: normal sinus rhythm  QRS Axis: normal  Intervals: normal  ST/T Wave abnormalities: normal  Conduction Disutrbances:none  Narrative Interpretation:   Old EKG Reviewed: none available    Results for orders placed during the hospital encounter of 05/15/11  CBC      Component Value Range   WBC 10.5  4.0 - 10.5 (K/uL)   RBC 4.30  3.87 - 5.11 (MIL/uL)   Hemoglobin 13.0  12.0 - 15.0 (g/dL)   HCT 16.1  09.6 - 04.5 (%)   MCV 92.6  78.0 - 100.0 (fL)   MCH 30.2  26.0 - 34.0 (pg)   MCHC 32.7  30.0 - 36.0 (g/dL)   RDW 40.9  81.1 - 91.4 (%)   Platelets 229  150 - 400 (K/uL)  BASIC METABOLIC PANEL      Component Value Range   Sodium 135  135 - 145 (mEq/L)   Potassium 3.9  3.5 - 5.1 (mEq/L)   Chloride 102  96 - 112 (mEq/L)   CO2 23  19 - 32 (mEq/L)   Glucose, Bld 275 (*) 70 - 99 (mg/dL)   BUN 8  6 - 23 (mg/dL)   Creatinine, Ser 7.82  0.50 - 1.10 (mg/dL)   Calcium 9.1  8.4 - 95.6 (mg/dL)   GFR calc non Af Amer >90  >90 (mL/min)   GFR calc Af Amer >90  >90 (mL/min)  D-DIMER, QUANTITATIVE      Component Value Range   D-Dimer, Quant 3.43 (*) 0.00 - 0.48 (ug/mL-FEU)  POCT I-STAT TROPONIN I      Component Value Range   Troponin i, poc 0.01  0.00 - 0.08 (ng/mL)   Comment 3            Dg Chest 2 View  05/15/2011  *RADIOLOGY REPORT*  Clinical Data: Left-sided chest pain radiating to the shoulders and back, hypertension, smoking history  CHEST - 2 VIEW  Comparison: None.  Findings: No pneumonia is seen.  Linear atelectasis or scarring is noted at the right lung base.  The heart is within upper limits of normal.  No acute bony abnormality is seen.  IMPRESSION: No active lung disease.  Linear atelectasis or scarring at the right lung base.  Original Report Authenticated By: Juline Patch, M.D.   Ct Angio  Chest W/cm &/or Wo Cm  05/15/2011  *RADIOLOGY REPORT*  Clinical Data:  Chest pain, elevated D-dimer  CT ANGIOGRAPHY CHEST WITH CONTRAST  Technique:  Multidetector CT imaging of the chest was performed using the standard protocol during bolus administration of intravenous contrast.  Multiplanar CT image reconstructions including MIPs were obtained to evaluate the vascular anatomy.  Contrast: OMNIPAQUE IOHEXOL 300 MG/ML IV SOLN  Comparison:  Chest radiograph 05/15/2011  Findings:  No filling defects within the  pulmonary arteries to suggest acute pulmonary embolism.  No acute findings of the aorta or great vessels. No pericardial fluid.  Review of the lung windows demonstrates no pleural fluid or pneumothorax.  There is linear atelectasis within the right middle lobe and lingula.  There is interlobular septal thickening at the left and right lung bases.  Airways are normal.  There is no axillary lymphadenopathy.  Thyroid gland is enlarged. No mediastinal lymphadenopathy.  Esophagus is partially gas filled.  Limited view of the upper abdomen is unremarkable.  Limited view of the skeleton is unremarkable.  The  Review of the MIP images confirms the above findings.  IMPRESSION:  1.  No evidence of acute pulmonary embolism. 2.  Linear atelectasis in the right middle lobe and lingula. 3.  Interstitial edema at the lung bases.  Original Report Authenticated By: Genevive Bi, M.D.      MDM          Lottie Mussel, PA 05/15/11 1146

## 2011-05-15 NOTE — ED Notes (Signed)
Report called to [facility] on 4700, there is no bed in the patient's room. Lillia Abed to call when ready. Pt will be transferred to the yellow side to wait and will be transported by [facility].

## 2011-05-15 NOTE — ED Notes (Signed)
Pt stated she started having left sided chest pain that radiated down her left arm. Pain started around 7 am yesterday.  Pt states that she could not control pain so she smoked crack last night around 11 pm.

## 2011-05-15 NOTE — H&P (Signed)
Hospital Admission Note Date: 05/15/2011  Patient name: [name] Medical record number: [id number] Date of birth: 1956/03/24 Age: 55 y.o. Gender: female PCP: No primary provider on file.  Medical Service: Internal medicine teaching service  Attending physician:  Dr. Ninetta Lights    1st Contact: Dr. Milbert Coulter Pager: [phone] 2nd Contact: Dr. Loistine Chance Pager: [phone]  After 5 pm or weekends: 1st Contact:  Pager: [phone] 2nd Contact:  Pager: [phone]  Chief Complaint:  Chest pain and pressure  History of Present Illness: Patient is a 55 year old female with past medical history significant for type 2 diabetes, hypertension, and dyslipidemia who presents with a one-day history of left-sided chest pain. She notes the pain began on the evening prior to admission and woke her from sleep. She describes the pain as a constant, 10 out of 10, "take her breath away" left sided chest pain radiating into her left neck and into her left arm associated with tingling and numbness in her left fingers. She notes the pain does not have any alleviating factors and seems worse with lying on her right side and also with movement. She is unsure if her pain is worse with exertion.  She tried taking some over-the-counter ibuprofen, as well as goodies powder, to alleviate her pain and did not appreciate any change in her level of discomfort . She also smoked crack cocaine on evening prior to admission an attempt to alleviate her pain; she notes her chest pain worsened after smoking crack. She admits to 2 episodes of nonbloody, nonbilious vomiting on the evening prior to admission associated with her chest pain. She denies any similar episodes of chest pain in the past.   She denies any headache, visual changes, shortness of breath, dyspnea on exertion, syncope, visual changes, weakness, difficulties peaking, or difficulty walking.  She has no other complaints at this time.  Meds: Lisinopril 20 mg 1 tab by mouth daily number  next  Insulin  NovoLog 70/30 35 units subcutaneous q AM and 25 units subcutaneous q PM  Allergies: NKDA  Past Medical History  Diagnosis Date  . Diabetes mellitus   . Hypertension    Family history: Noncontributory- mother with ETOH abuse, deceased. Father with CVA x 3, deceased.   Social history: Patient lives in [place] with her (common law) husband of 26 years. She is a current smoker; 0.5 ppd x 25 yrs. She smokes crack cocaine once per week. She denies any IV drug use or other illicit drug use. She drinks approximately 12 beers per day which she has been doing for the past 3 months (but states that she never drinks alone).  Review of Systems: Pertinent items are noted in HPI.  Physical Exam: Blood pressure 151/77, pulse 97, temperature 98.2 F (36.8 C), temperature source Oral, resp. rate 14, SpO2 98.00%. VItal signs reviewed and stable. GEN: No apparent distress.  Alert and oriented x 3.  Pleasant, conversant, and cooperative to exam. HEENT: head is autraumatic and normocephalic.  Neck is supple without palpable masses or lymphadenopathy.  No JVD or carotid bruits.  Vision intact.  EOMI.  PERRLA.  Sclerae anicteric.  Conjunctivae without pallor or injection. Mucous membranes are moist.  Oropharynx is without erythema, exudates, or other abnormal lesions.  Marland Kitchen RESP:  Lungs are clear to ascultation bilaterally with good air movement.  No wheezes, ronchi, or rubs. CARDIOVASCULAR: regular rate, normal rhythm.  Clear S1, S2, no murmurs, gallops, or rubs. Her chest pain is not reproducible with palpation ABDOMEN: soft, non-tender, non-distended.  Bowels  sounds present in all quadrants and normoactive.  No palpable masses. EXT: warm and dry.  Peripheral pulses equal, intact, and +2 globally.  No clubbing or cyanosis.  No edema in bilateral lower extremities. SKIN: warm and dry with normal turgor.  No rashes or abnormal lesions observed. NEURO: CN II-XII grossly intact.  Muscle strength  +5/5 in bilateral upper and lower extremities.  Sensation is grossly intact.  No focal deficit.   Lab results: Basic Metabolic Panel:  Basename 05/15/11 0630  NA 135  K 3.9  CL 102  CO2 23  GLUCOSE 275*  BUN 8  CREATININE 0.58  CALCIUM 9.1  MG --  PHOS --   CBC:  Basename 05/15/11 0630  WBC 10.5  NEUTROABS --  HGB 13.0  HCT 39.8  MCV 92.6  PLT 229   Cardiac Enzymes: pending  D-Dimer:  Basename 05/15/11 0630  DDIMER 3.43*     Imaging results:  Dg Chest 2 View  05/15/2011  *RADIOLOGY REPORT*  Clinical Data: Left-sided chest pain radiating to the shoulders and back, hypertension, smoking history  CHEST - 2 VIEW  Comparison: None.  Findings: No pneumonia is seen.  Linear atelectasis or scarring is noted at the right lung base.  The heart is within upper limits of normal.  No acute bony abnormality is seen.  IMPRESSION: No active lung disease.  Linear atelectasis or scarring at the right lung base.  Original Report Authenticated By: Juline Patch, M.D.   Ct Angio Chest W/cm &/or Wo Cm  05/15/2011  *RADIOLOGY REPORT*  Clinical Data:  Chest pain, elevated D-dimer  CT ANGIOGRAPHY CHEST WITH CONTRAST  Technique:  Multidetector CT imaging of the chest was performed using the standard protocol during bolus administration of intravenous contrast.  Multiplanar CT image reconstructions including MIPs were obtained to evaluate the vascular anatomy.  Contrast: OMNIPAQUE IOHEXOL 300 MG/ML IV SOLN  Comparison:  Chest radiograph 05/15/2011  Findings:  No filling defects within the pulmonary arteries to suggest acute pulmonary embolism.  No acute findings of the aorta or great vessels. No pericardial fluid.  Review of the lung windows demonstrates no pleural fluid or pneumothorax.  There is linear atelectasis within the right middle lobe and lingula.  There is interlobular septal thickening at the left and right lung bases.  Airways are normal.  There is no axillary lymphadenopathy.   Thyroid gland is enlarged. No mediastinal lymphadenopathy.  Esophagus is partially gas filled.  Limited view of the upper abdomen is unremarkable.  Limited view of the skeleton is unremarkable.  The  Review of the MIP images confirms the above findings.  IMPRESSION:  1.  No evidence of acute pulmonary embolism. 2.  Linear atelectasis in the right middle lobe and lingula. 3.  Interstitial edema at the lung bases.  Original Report Authenticated By: Genevive Bi, M.D.    Assessment & Plan by Problem: # Chest pain: Given patient's history and risk factors, ACS is most concerning etiology for her pain exacerbated, if not precipitated by, her crack cocaine use.  There is no evidence for pulmonary embolism, pneumothorax, PNA, or other concerning coronary process on her CT in June chest obtained in the emergency room.  Given her history of alcohol use GERD or alcoholic gastritis may also be a contributing factor and/or the cause of her chest discomfort: - Admit to telemetry - Cardiac enzymes - Twelve-lead EKG in a.m. - Risk stratify with fasting lipid panel hemoglobin A1c in the morning - Sublingual nitroglycerin and morphine  for pain control  #Cocaine use: Will obtain social work consult for substance abuse. Will also obtain urine drug screen.  Will avoid use of beta blockers in the setting of her ongoing cocaine use.  #Tobacco use: Will provide a nicotine patch and obtain smoking cessation consult  #DM: Insulin 70/30 and SSI - sensitive scale; will advance if indicated  #DVT ppx: lovenox  Signed: MILLS,KRISTIN 05/15/2011, 12:03 PM   Internal Medicine Teaching Service Attending Note Date: 05/15/2011  Patient name: [name]  Medical record number: [id number]  Date of birth: 1956/03/02   I have seen and evaluated Rabia Dobie and discussed their care with the Residency Team.   I have spoken with the patient and concur with the history as listed above.  I have examined the patient  and confirm the above findings. Would add that she has significant pain in her L shoulder with movement.   Assessment and Plan: I agree with the formulated Assessment and Plan with the following changes:  Await her cardiac enzymes, will watch on tele. Needs crack and etoh detox, consider adding CIWA protocol.  Improve her diabetic control, f/u her HgBA1C.   Electronically signed: Johny Sax 05/15/2011

## 2011-05-15 NOTE — ED Notes (Signed)
Attempted to call report to 27, RN unavailable and will call back in 5 min

## 2011-05-15 NOTE — ED Notes (Signed)
Patient with shortness of breath and chest pain, decided to smoke crack cocaine, now with worse chest pain and increased shortness of breath.

## 2011-05-16 ENCOUNTER — Encounter (HOSPITAL_COMMUNITY): Payer: Self-pay

## 2011-05-16 ENCOUNTER — Other Ambulatory Visit: Payer: Self-pay

## 2011-05-16 DIAGNOSIS — B373 Candidiasis of vulva and vagina: Secondary | ICD-10-CM | POA: Diagnosis present

## 2011-05-16 DIAGNOSIS — E119 Type 2 diabetes mellitus without complications: Secondary | ICD-10-CM | POA: Diagnosis present

## 2011-05-16 DIAGNOSIS — R079 Chest pain, unspecified: Secondary | ICD-10-CM

## 2011-05-16 DIAGNOSIS — R0789 Other chest pain: Secondary | ICD-10-CM | POA: Diagnosis present

## 2011-05-16 LAB — LIPID PANEL
HDL: 42 mg/dL (ref >39)
LDL Cholesterol: 96 mg/dL (ref 0–99)
Triglycerides: 147 mg/dL (ref <150)
VLDL: 29 mg/dL (ref 0–40)

## 2011-05-16 LAB — CARDIAC PANEL(CRET KIN+CKTOT+MB+TROPI)
CK, MB: 2.6 ng/mL (ref 0.3–4.0)
Relative Index: 2.4 (ref 0.0–2.5)
Total CK: 107 U/L (ref 7–177)

## 2011-05-16 LAB — HEPATITIS PANEL, ACUTE
HCV Ab: REACTIVE — AB
Hep A IgM: NEGATIVE

## 2011-05-16 LAB — BASIC METABOLIC PANEL
BUN: 10 mg/dL (ref 6–23)
CO2: 30 mEq/L (ref 19–32)
Chloride: 102 mEq/L (ref 96–112)
Glucose, Bld: 159 mg/dL — ABNORMAL HIGH (ref 70–99)
Potassium: 4.4 mEq/L (ref 3.5–5.1)
Sodium: 139 mEq/L (ref 135–145)

## 2011-05-16 LAB — TSH: TSH: 3.675 u[IU]/mL (ref 0.350–4.500)

## 2011-05-16 LAB — GLUCOSE, CAPILLARY
Glucose-Capillary: 193 mg/dL — ABNORMAL HIGH (ref 70–99)
Glucose-Capillary: 268 mg/dL — ABNORMAL HIGH (ref 70–99)

## 2011-05-16 LAB — RPR: RPR Ser Ql: NONREACTIVE

## 2011-05-16 MED ORDER — INFLUENZA VIRUS VACC SPLIT PF IM SUSP
0.5000 mL | Freq: Once | INTRAMUSCULAR | Status: AC
  Administered 2011-05-16: 0.5 mL via INTRAMUSCULAR
  Filled 2011-05-16: qty 0.5

## 2011-05-16 MED ORDER — MORPHINE SULFATE 4 MG/ML IJ SOLN
6.0000 mg | INTRAMUSCULAR | Status: DC | PRN
  Administered 2011-05-16 (×2): 6 mg via INTRAVENOUS
  Filled 2011-05-16 (×2): qty 2

## 2011-05-16 MED ORDER — FLUCONAZOLE 150 MG PO TABS
150.0000 mg | ORAL_TABLET | Freq: Every day | ORAL | Status: DC
  Administered 2011-05-16: 150 mg via ORAL
  Filled 2011-05-16: qty 1

## 2011-05-16 MED ORDER — HYDROCODONE-ACETAMINOPHEN 5-500 MG PO CAPS: 1.0000 | ORAL_CAPSULE | Freq: Four times a day (QID) | ORAL | Status: AC | PRN

## 2011-05-16 MED ORDER — PNEUMOCOCCAL VAC POLYVALENT 25 MCG/0.5ML IJ INJ
0.5000 mL | INJECTION | INTRAMUSCULAR | Status: AC | PRN
  Administered 2011-05-16: 0.5 mL via INTRAMUSCULAR
  Filled 2011-05-16: qty 0.5

## 2011-05-16 NOTE — Progress Notes (Signed)
Subjective: Patient reports continued left sided chest/shoulder/back pain overnight, worse with movement or changes in position, which was well-controlled with morphine, but returned without morphine.  CE's were negative, and EKG shows no acute process.  Telemetry showed no abnormalities overnight.  Objective: Vital signs in last 24 hours: Filed Vitals:   05/15/11 2030 05/15/11 2134 05/16/11 0227 05/16/11 0539  BP: 154/70 174/101 160/95 138/76  Pulse: 96 96 87 84  Temp:  99.1 F (37.3 C) 98.3 F (36.8 C) 98.2 F (36.8 C)  TempSrc:  Oral    Resp:  20 20 20   Height:      Weight:    224 lb 6.9 oz (101.8 kg)  SpO2:  93% 94% 94%    Physical Exam: General: alert, cooperative, reporting pain HEENT: pupils equal round and reactive to light, vision grossly intact, oropharynx clear and non-erythematous  Neck: supple, no lymphadenopathy, no JVD Lungs: clear to ascultation bilaterally, normal work of respiration, no wheezes, rales, ronchi   Chest/Back: Chest, back, spine, and scapula non-tender to palpation Heart: regular rate and rhythm, no murmurs, gallops, or rubs Abdomen: soft, non-tender, non-distended, normal bowel sounds Msk: Left shoulder minimally tender to palpation, ROM inconsistently limited by pain, supraspinatus test positive, infraspinatus, subscapularis tests negative Extremities: no cyanosis, clubbing, or edema Neurologic: alert & oriented X3, cranial nerves II-XII intact, strength grossly intact though limited in LUE by pain, sensation intact to light touch  Lab Results: Basic Metabolic Panel:  Lab 05/16/11 4098 05/15/11 1752  NA 139 138  K 4.4 4.3  CL 102 100  CO2 30 28  GLUCOSE 159* 307*  BUN 10 9  CREATININE 0.71 0.68  CALCIUM 9.3 9.4  MG -- --  PHOS -- --   Liver Function Tests:  Lab 05/15/11 1752  AST 26  ALT 24  ALKPHOS 52  BILITOT 0.4  PROT 8.3  ALBUMIN 3.2*   CBC:  Lab 05/15/11 1752 05/15/11 0630  WBC 11.3* 10.5  NEUTROABS -- --  HGB 14.3  13.0  HCT 41.9 39.8  MCV 93.9 92.6  PLT 247 229   Cardiac Enzymes:  Lab 05/16/11 0018 05/15/11 1715  CKTOTAL 107 125  CKMB 2.6 3.0  CKMBINDEX -- --  TROPONINI <0.30 <0.30    CBG:  Lab 05/16/11 0753 05/16/11 0439 05/16/11 0004 05/15/11 2131 05/15/11 1934 05/15/11 1559  GLUCAP 193* 161* 312* 274* 321* 276*   Hemoglobin A1C:  Lab 05/15/11 1752  HGBA1C 12.6*   Fasting Lipid Panel:  Lab 05/16/11 0621  CHOL 167  HDL 42  LDLCALC 96  TRIG 147  CHOLHDL 4.0  LDLDIRECT --   Thyroid Function Tests:  Lab 05/15/11 1752  TSH 3.675               Urine Drug Screen: Positive for cocaine  Studies/Results: Dg Chest 2 View  05/15/2011  *RADIOLOGY REPORT*  Clinical Data: Left-sided chest pain radiating to the shoulders and back, hypertension, smoking history  CHEST - 2 VIEW  Comparison: None.  Findings: No pneumonia is seen.  Linear atelectasis or scarring is noted at the right lung base.  The heart is within upper limits of normal.  No acute bony abnormality is seen.  IMPRESSION: No active lung disease.  Linear atelectasis or scarring at the right lung base.  Original Report Authenticated By: Juline Patch, M.D.   Ct Angio Chest W/cm &/or Wo Cm  05/15/2011  *RADIOLOGY REPORT*  Clinical Data:  Chest pain, elevated D-dimer  CT ANGIOGRAPHY CHEST WITH CONTRAST  Technique:  Multidetector CT imaging of the chest was performed using the standard protocol during bolus administration of intravenous contrast.  Multiplanar CT image reconstructions including MIPs were obtained to evaluate the vascular anatomy.  Contrast: OMNIPAQUE IOHEXOL 300 MG/ML IV SOLN  Comparison:  Chest radiograph 05/15/2011  Findings:  No filling defects within the pulmonary arteries to suggest acute pulmonary embolism.  No acute findings of the aorta or great vessels. No pericardial fluid.  Review of the lung windows demonstrates no pleural fluid or pneumothorax.  There is linear atelectasis within the right middle  lobe and lingula.  There is interlobular septal thickening at the left and right lung bases.  Airways are normal.  There is no axillary lymphadenopathy.  Thyroid gland is enlarged. No mediastinal lymphadenopathy.  Esophagus is partially gas filled.  Limited view of the upper abdomen is unremarkable.  Limited view of the skeleton is unremarkable.  The  Review of the MIP images confirms the above findings.  IMPRESSION:  1.  No evidence of acute pulmonary embolism. 2.  Linear atelectasis in the right middle lobe and lingula. 3.  Interstitial edema at the lung bases.  Original Report Authenticated By: Genevive Bi, M.D.   Medications: I have reviewed the patient's current medications. Scheduled Meds:   . aspirin  324 mg Oral NOW   Or  . aspirin  300 mg Rectal NOW  . aspirin EC  81 mg Oral Daily  . enoxaparin  40 mg Subcutaneous Q24H  . folic acid  1 mg Oral Daily  . insulin aspart  0-9 Units Subcutaneous Q4H  . insulin aspart protamine-insulin aspart  15 Units Subcutaneous BID WC  . lisinopril  20 mg Oral Daily  . morphine      . multivitamins ther. w/minerals  1 tablet Oral Daily  . nicotine  14 mg Transdermal Daily  . ondansetron      . pantoprazole  40 mg Oral Q1200  . sodium chloride  3 mL Intravenous Q12H  . vitamin B-1  100 mg Oral Daily   Or  . thiamine  100 mg Intravenous Daily   Continuous Infusions:   . DISCONTD: sodium chloride     PRN Meds:.acetaminophen, LORazepam, LORazepam, morphine injection, nitroGLYCERIN, nitroGLYCERIN, ondansetron (ZOFRAN) IV, sodium chloride, zolpidem, DISCONTD:  morphine injection, DISCONTD: nitroGLYCERIN, DISCONTD: nitroGLYCERIN, DISCONTD: nitroGLYCERIN  Assessment/Plan: The patient is a 55 yo woman with a history of diabetes, HTN, presenting with left-sided chest pain, with negative CE's, EKG, and normal telemetry, likely representing musculoskeletal pain.  1. Chest/L Shoulder Pain - Likely musculoskeletal given exacerbation with movement.   Unlikely cardiac given negative CE's, negative EKG, and normal telemetry.  CTA negative for PE. -transition to PO pain medications -CE's, EKG normal.  D/c telemetry. -may benefit from PT evaluation of shoulder  2. Diabetes - Hb A1C = 12.6, and records reveal that this value has been consistently high for the past several years.  Patient currently only uses Novolog 70/30. -Novolog 70/30 with SSI while in-hospital -patient will likely need increase in Novolog 70/30 dose and close follow-up as outpatient  3. Polysubstance use - history of cocaine, tobacco, and alcohol use.  UDS positive for cocaine. -CIWA protocol -SW consult for substance abuse cessation  4. Prophy - lovenox  5. Dispo - likely d/c today, with outpatient follow-up for musculoskeletal shoulder pain.    LOS: 1 day   Janalyn Harder 05/16/2011, 9:35 AM

## 2011-05-16 NOTE — Discharge Summary (Signed)
Internal Medicine Teaching [facility] Discharge Note  Name: [name] MRN: [id number] DOB: 09/15/1955 55 y.o.  Date of Admission: 05/15/2011  5:08 AM Date of Discharge: 05/16/2011 Attending Physician: Johny Sax, MD  Discharge Diagnosis: Active Problems: 1. Chest pain, musculoskeletal - presented with left chest/shoulder pain, ruled out ACS and PE, likely msk, managed with pain control. 2. Hypertension 3. Diabetes mellitus, type 2 - Hb A1C = 12.6 on admission 4. Vaginal candidiasis   Discharge Medications: Current Discharge Medication List    START taking these medications   Details  hydrocodone-acetaminophen (LORCET-HD) 5-500 MG per capsule Take 1 capsule by mouth every 6 (six) hours as needed for pain. Qty: 20 capsule, Refills: 0      CONTINUE these medications which have NOT CHANGED   Details  insulin aspart protamine-insulin aspart (NOVOLOG 70/30) (70-30) 100 UNIT/ML injection Inject 25-35 Units into the skin 2 (two) times daily with a meal. 35 units in the morning and 25 units in the evening     lisinopril (PRINIVIL,ZESTRIL) 20 MG tablet Take 20 mg by mouth daily.      Aspirin-Acetaminophen-Caffeine (GOODY HEADACHE PO) Take 1 packet by mouth daily as needed. For pain       STOP taking these medications     ibuprofen (ADVIL,MOTRIN) 400 MG tablet         Disposition and follow-up:   [name] was discharged from [facility] on 05/16/11 in stable and improved condition, with improvement in musculoskeletal chest pain.  The patient will follow-up at [facility] on 11/20, at 8:00 am, where she will follow-up on her chronic hypertension and diabetes, and further address any lingering musculoskeletal pain.  Follow-up Appointments: 1. [facility], 05/30/11, at 8:00 a.m.  Phone: [phone].  [address] [place], [place]  Discharge Orders    Future Orders Please Complete By Expires   Diet - low  sodium heart healthy      Increase activity slowly      Discharge instructions      Comments:   Your left-sided chest and shoulder pain is likely from a muscle-type pain.  We performed a full work-up of your heart, and found that the pain is not from a heart cause.  You may manage your pain with pain medications, and follow-up with [facility].   Call MD for:  temperature >100.4      Call MD for:  persistant nausea and vomiting      Call MD for:  redness, tenderness, or signs of infection (pain, swelling, redness, odor or green/yellow discharge around incision site)         Consultations: None  Procedures Performed:  Dg Chest 2 View  05/15/2011  *RADIOLOGY REPORT*  Clinical Data: Left-sided chest pain radiating to the shoulders and back, hypertension, smoking history  CHEST - 2 VIEW  Comparison: None.  Findings: No pneumonia is seen.  Linear atelectasis or scarring is noted at the right lung base.  The heart is within upper limits of normal.  No acute bony abnormality is seen.  IMPRESSION: No active lung disease.  Linear atelectasis or scarring at the right lung base.  Original Report Authenticated By: Juline Patch, M.D.   Ct Angio Chest W/cm &/or Wo Cm  05/15/2011  *RADIOLOGY REPORT*  Clinical Data:  Chest pain, elevated D-dimer  CT ANGIOGRAPHY CHEST WITH CONTRAST  Technique:  Multidetector CT imaging of the chest was performed using the standard protocol during bolus administration of intravenous  contrast.  Multiplanar CT image reconstructions including MIPs were obtained to evaluate the vascular anatomy.  Contrast: OMNIPAQUE IOHEXOL 300 MG/ML IV SOLN  Comparison:  Chest radiograph 05/15/2011  Findings:  No filling defects within the pulmonary arteries to suggest acute pulmonary embolism.  No acute findings of the aorta or great vessels. No pericardial fluid.  Review of the lung windows demonstrates no pleural fluid or pneumothorax.  There is linear atelectasis within the right  middle lobe and lingula.  There is interlobular septal thickening at the left and right lung bases.  Airways are normal.  There is no axillary lymphadenopathy.  Thyroid gland is enlarged. No mediastinal lymphadenopathy.  Esophagus is partially gas filled.  Limited view of the upper abdomen is unremarkable.  Limited view of the skeleton is unremarkable.  The  Review of the MIP images confirms the above findings.  IMPRESSION:  1.  No evidence of acute pulmonary embolism. 2.  Linear atelectasis in the right middle lobe and lingula. 3.  Interstitial edema at the lung bases.  Original Report Authenticated By: Genevive Bi, M.D.    Admission HPI:  Patient is a 55 year old female with past medical history significant for type 2 diabetes, hypertension, and dyslipidemia who presents with a one-day history of left-sided chest pain. She notes the pain began on the evening prior to admission and woke her from sleep. She describes the pain as a constant, 10 out of 10, "take her breath away" left sided chest pain radiating into her left neck and into her left arm associated with tingling and numbness in her left fingers. She notes the pain does not have any alleviating factors and seems worse with lying on her right side and also with movement. She is unsure if her pain is worse with exertion. She tried taking some over-the-counter ibuprofen, as well as goodies powder, to alleviate her pain and did not appreciate any change in her level of discomfort . She also smoked crack cocaine on evening prior to admission an attempt to alleviate her pain; she notes her chest pain worsened after smoking crack. She admits to 2 episodes of nonbloody, nonbilious vomiting on the evening prior to admission associated with her chest pain. She denies any similar episodes of chest pain in the past. She denies any headache, visual changes, shortness of breath, dyspnea on exertion, syncope, visual changes, weakness, difficulties peaking, or  difficulty walking. She has no other complaints at this time.  Physical Exam:  Blood pressure 151/77, pulse 97, temperature 98.2 F (36.8 C), temperature source Oral, resp. rate 14, SpO2 98.00%.  VItal signs reviewed and stable.  GEN: No apparent distress. Alert and oriented x 3. Pleasant, conversant, and cooperative to exam.  HEENT: head is autraumatic and normocephalic. Neck is supple without palpable masses or lymphadenopathy. No JVD or carotid bruits. Vision intact. EOMI. PERRLA. Sclerae anicteric. Conjunctivae without pallor or injection. Mucous membranes are moist. Oropharynx is without erythema, exudates, or other abnormal lesions. Marland Kitchen  RESP: Lungs are clear to ascultation bilaterally with good air movement. No wheezes, ronchi, or rubs.  CARDIOVASCULAR: regular rate, normal rhythm. Clear S1, S2, no murmurs, gallops, or rubs. Her chest pain is not reproducible with palpation  ABDOMEN: soft, non-tender, non-distended. Bowels sounds present in all quadrants and normoactive. No palpable masses.  EXT: warm and dry. Peripheral pulses equal, intact, and +2 globally. No clubbing or cyanosis. No edema in bilateral lower extremities.  SKIN: warm and dry with normal turgor. No rashes or abnormal lesions observed.  NEURO: CN II-XII grossly intact. Muscle strength +5/5 in bilateral upper and lower extremities. Sensation is grossly intact. No focal deficit.  Admitting Labs: BMET: Na 135, K 3.9, Cl 102, CO2 23, BUN 8, Cr 0.58, Glucose 275 CBC: WBC 10.5, Hb 13, HCT 39.8, Platelet 229  Hospital Course by problem list: 1. Musculoskeletal Chest pain - The patient presented with left-sided chest, back and shoulder pain, worsened by movement of her left shoulder.  We were initially concerned for ACS, but cardiac enzymes, EKG, and 24-hour telemetry revealed no abnormality.  The patient was also found to have an elevated D-dimer, but a CTA chest revealed no PE.  The pain was reproducible with certain physical exam  maneuvers of the left shoulder, though a specific site of pain could not be exactly localized.  The patient's pain was well-managed with morphine in the inpatient setting, and the patient was discharged on short-term hydrocodone, with follow-up with her PCP.  2. Hypertension - The patient has a history of hypertension, currently managed with lisinopril.  The patient's BP's in-hospital ranged from the 130's to 160's systolic, but were likely acutely elevated in the setting of musculoskeletal pain.  The patient will follow-up with her PCP for further hypertension management.  3. Diabetes mellitus, type 2 - The patient has a history of diabetes, managed with Novolog 70/30, 35 units qam, 25 units qpm.  The patient was found to have a Hb A1C = 12.6 on admission, reflecting poor diabetes control.  The patient will likely need an increase in the dose of her Novolog, or additional agents for diabetes control.  However, without knowing the patient's home blood glucose readings, and without knowing that the patient has close follow-up with her PCP, we did not increase her regimen at this time, and will defer to management in the outpatient setting.  4. Vaginal candidiasis - On day 2 of hospitalization, the patient noted a few-day history of vaginal itching, with some white vaginal discharge.  The patient was given 1 dose of diflucan in-hospital prior to discharge.  Discharge Vitals:  BP 142/82  Pulse 84  Temp(Src) 98.2 F (36.8 C) (Oral)  Resp 20  Ht 5\' 8"  (1.727 m)  Wt 224 lb 6.9 oz (101.8 kg)  BMI 34.12 kg/m2  SpO2 94%  Discharge Labs:  Results for orders placed during the hospital encounter of 05/15/11 (from the past 24 hour(s))  GLUCOSE, CAPILLARY     Status: Abnormal   Collection Time   05/15/11  2:03 PM      Component Value Range   Glucose-Capillary 269 (*) 70 - 99 (mg/dL)  GLUCOSE, CAPILLARY     Status: Abnormal   Collection Time   05/15/11  3:59 PM      Component Value Range    Glucose-Capillary 276 (*) 70 - 99 (mg/dL)   Comment 1 Notify RN     Comment 2 Documented in Chart    HIV ANTIBODY     Status: Normal   Collection Time   05/15/11  4:11 PM      Component Value Range   HIV NON REACTIVE  NON REACTIVE   HEPATITIS PANEL, ACUTE     Status: Normal (Preliminary result)   Collection Time   05/15/11  4:11 PM      Component Value Range   Hepatitis B Surface Ag NEGATIVE  NEGATIVE    HCV Ab PENDING  NEGATIVE    Hep A IgM PENDING  NEGATIVE    Hep B C IgM PENDING  NEGATIVE   RPR     Status: Normal   Collection Time   05/15/11  4:11 PM      Component Value Range   RPR NON REACTIVE  NON REACTIVE   CARDIAC PANEL(CRET KIN+CKTOT+MB+TROPI)     Status: Normal   Collection Time   05/15/11  5:15 PM      Component Value Range   Total CK 125  7 - 177 (U/L)   CK, MB 3.0  0.3 - 4.0 (ng/mL)   Troponin I <0.30  <0.30 (ng/mL)   Relative Index 2.4  0.0 - 2.5   COMPREHENSIVE METABOLIC PANEL     Status: Abnormal   Collection Time   05/15/11  5:52 PM      Component Value Range   Sodium 138  135 - 145 (mEq/L)   Potassium 4.3  3.5 - 5.1 (mEq/L)   Chloride 100  96 - 112 (mEq/L)   CO2 28  19 - 32 (mEq/L)   Glucose, Bld 307 (*) 70 - 99 (mg/dL)   BUN 9  6 - 23 (mg/dL)   Creatinine, Ser 1.61  0.50 - 1.10 (mg/dL)   Calcium 9.4  8.4 - 09.6 (mg/dL)   Total Protein 8.3  6.0 - 8.3 (g/dL)   Albumin 3.2 (*) 3.5 - 5.2 (g/dL)   AST 26  0 - 37 (U/L)   ALT 24  0 - 35 (U/L)   Alkaline Phosphatase 52  39 - 117 (U/L)   Total Bilirubin 0.4  0.3 - 1.2 (mg/dL)   GFR calc non Af Amer >90  >90 (mL/min)   GFR calc Af Amer >90  >90 (mL/min)  HEMOGLOBIN A1C     Status: Abnormal   Collection Time   05/15/11  5:52 PM      Component Value Range   Hemoglobin A1C 12.6 (*) <5.7 (%)   Mean Plasma Glucose 315 (*) <117 (mg/dL)  TSH     Status: Normal   Collection Time   05/15/11  5:52 PM      Component Value Range   TSH 3.675  0.350 - 4.500 (uIU/mL)  CBC     Status: Abnormal   Collection Time    05/15/11  5:52 PM      Component Value Range   WBC 11.3 (*) 4.0 - 10.5 (K/uL)   RBC 4.46  3.87 - 5.11 (MIL/uL)   Hemoglobin 14.3  12.0 - 15.0 (g/dL)   HCT 04.5  40.9 - 81.1 (%)   MCV 93.9  78.0 - 100.0 (fL)   MCH 32.1  26.0 - 34.0 (pg)   MCHC 34.1  30.0 - 36.0 (g/dL)   RDW 91.4  78.2 - 95.6 (%)   Platelets 247  150 - 400 (K/uL)  GLUCOSE, CAPILLARY     Status: Abnormal   Collection Time   05/15/11  7:34 PM      Component Value Range   Glucose-Capillary 321 (*) 70 - 99 (mg/dL)  URINE RAPID DRUG SCREEN (HOSP PERFORMED)     Status: Abnormal   Collection Time   05/15/11  7:54 PM      Component Value Range   Opiates POSITIVE (*) NONE DETECTED    Cocaine POSITIVE (*) NONE DETECTED    Benzodiazepines NONE DETECTED  NONE DETECTED    Amphetamines NONE DETECTED  NONE DETECTED    Tetrahydrocannabinol NONE DETECTED  NONE DETECTED    Barbiturates NONE DETECTED  NONE DETECTED   GLUCOSE, CAPILLARY     Status: Abnormal  Collection Time   05/15/11  9:31 PM      Component Value Range   Glucose-Capillary 274 (*) 70 - 99 (mg/dL)   Comment 1 Notify RN    GLUCOSE, CAPILLARY     Status: Abnormal   Collection Time   05/16/11 12:04 AM      Component Value Range   Glucose-Capillary 312 (*) 70 - 99 (mg/dL)   Comment 1 Notify RN    CARDIAC PANEL(CRET KIN+CKTOT+MB+TROPI)     Status: Normal   Collection Time   05/16/11 12:18 AM      Component Value Range   Total CK 107  7 - 177 (U/L)   CK, MB 2.6  0.3 - 4.0 (ng/mL)   Troponin I <0.30  <0.30 (ng/mL)   Relative Index 2.4  0.0 - 2.5   GLUCOSE, CAPILLARY     Status: Abnormal   Collection Time   05/16/11  4:39 AM      Component Value Range   Glucose-Capillary 161 (*) 70 - 99 (mg/dL)   Comment 1 Notify RN    LIPID PANEL     Status: Normal   Collection Time   05/16/11  6:21 AM      Component Value Range   Cholesterol 167  0 - 200 (mg/dL)   Triglycerides 161  <096 (mg/dL)   HDL 42  >04 (mg/dL)   Total CHOL/HDL Ratio 4.0     VLDL 29  0 - 40 (mg/dL)    LDL Cholesterol 96  0 - 99 (mg/dL)  BASIC METABOLIC PANEL     Status: Abnormal   Collection Time   05/16/11  6:21 AM      Component Value Range   Sodium 139  135 - 145 (mEq/L)   Potassium 4.4  3.5 - 5.1 (mEq/L)   Chloride 102  96 - 112 (mEq/L)   CO2 30  19 - 32 (mEq/L)   Glucose, Bld 159 (*) 70 - 99 (mg/dL)   BUN 10  6 - 23 (mg/dL)   Creatinine, Ser 5.40  0.50 - 1.10 (mg/dL)   Calcium 9.3  8.4 - 98.1 (mg/dL)   GFR calc non Af Amer >90  >90 (mL/min)   GFR calc Af Amer >90  >90 (mL/min)  GLUCOSE, CAPILLARY     Status: Abnormal   Collection Time   05/16/11  7:53 AM      Component Value Range   Glucose-Capillary 193 (*) 70 - 99 (mg/dL)  GLUCOSE, CAPILLARY     Status: Abnormal   Collection Time   05/16/11 11:22 AM      Component Value Range   Glucose-Capillary 268 (*) 70 - 99 (mg/dL)    Signed: Janalyn Harder 05/16/2011, 1:37 PM

## 2011-05-16 NOTE — Progress Notes (Signed)
  Internal Medicine Teaching Service Attending Note Date: 05/16/2011  Patient name: [name]  Medical record number: [id number]  Date of birth: 1955/08/04    This patient has been seen and discussed with the house staff. Please see their note for complete details. I concur with their findings with the following additions/corrections: She had episode of further CP this AM.  Her CKs/Troponin were negative.  Will attempt to better control her BP. Will f/u to see if she has needed detox/support.  Johny Sax 05/16/2011, 9:23 AM

## 2011-05-16 NOTE — Plan of Care (Signed)
Problem: Discharge Progression Outcomes Goal: Vascular site scale level 0 - I Vascular Site Scale Level 0: No bruising/bleeding/hematoma Level I (Mild): Bruising/Ecchymosis, minimal bleeding/ooozing, palpable hematoma < 3 cm Level II (Moderate): Bleeding not affecting hemodynamic parameters, pseudoaneurysm, palpable hematoma > 3 cm  Outcome: Completed/Met Date Met:  05/16/11 Level 0

## 2011-05-16 NOTE — Progress Notes (Signed)
Patient stable upon discharge. Patient stated that doctor gave prescription. RN gave discharge instructions. Patient had a social work consult, but patient refused to wait for the Child psychotherapist and stated she did not want to wait. Patient left in wheelchair. RN wheeler patient out of the hospital with the patient's relatives.

## 2011-05-16 NOTE — Progress Notes (Signed)
Inpatient Diabetes Program Recommendations  AACE/ADA: New Consensus Statement on Inpatient Glycemic Control (2009)  Target Ranges:  Prepandial:   less than 140 mg/dL      Peak postprandial:   less than 180 mg/dL (1-2 hours)      Critically ill patients:  140 - 180 mg/dL   Reason for Visit:   Inpatient Diabetes Program Recommendations HgbA1C: =12.6 Diet: Add CHO modified   Note:

## 2011-05-16 NOTE — Plan of Care (Signed)
Problem: Phase I Progression Outcomes Goal: Anginal pain relieved Outcome: Progressing Pt. Has increased anterior and posterior chest pain with movement and changes in position.

## 2011-05-16 NOTE — Progress Notes (Signed)
Cosign for Lindsay Welch RN assessment, medication, IV, and care plan. 

## 2011-05-16 NOTE — ED Provider Notes (Signed)
Evaluation and management procedures were performed by the PA/NP under my supervision/collaboration  Ranay Ketter D Domini Vandehei, MD 05/16/11 0119 

## 2011-05-19 ENCOUNTER — Encounter: Payer: Self-pay | Admitting: Dietician

## 2012-04-09 ENCOUNTER — Encounter (HOSPITAL_COMMUNITY): Payer: Self-pay | Admitting: *Deleted

## 2012-04-09 ENCOUNTER — Emergency Department (HOSPITAL_COMMUNITY)
Admission: EM | Admit: 2012-04-09 | Discharge: 2012-04-09 | Disposition: A | Discharge status: Home / Self Care | Payer: Medicaid Other | Source: Home / Self Care | Attending: Emergency Medicine | Admitting: Emergency Medicine

## 2012-04-09 DIAGNOSIS — E119 Type 2 diabetes mellitus without complications: Secondary | ICD-10-CM

## 2012-04-09 LAB — POCT I-STAT, CHEM 8
Chloride: 99 mEq/L (ref 96–112)
Creatinine, Ser: 1 mg/dL (ref 0.50–1.10)
Glucose, Bld: 328 mg/dL — ABNORMAL HIGH (ref 70–99)
HCT: 48 % — ABNORMAL HIGH (ref 36.0–46.0)
Potassium: 4.5 mEq/L (ref 3.5–5.1)
Sodium: 137 mEq/L (ref 135–145)

## 2012-04-09 LAB — POCT URINALYSIS DIP (DEVICE)
Glucose, UA: 500 mg/dL — AB
Ketones, ur: 15 mg/dL — AB
Specific Gravity, Urine: 1.02 (ref 1.005–1.030)

## 2012-04-09 LAB — GLUCOSE, CAPILLARY: Glucose-Capillary: 332 mg/dL — ABNORMAL HIGH (ref 70–99)

## 2012-04-09 MED ORDER — TRAMADOL HCL 50 MG PO TABS: 50.0000 mg | ORAL_TABLET | Freq: Four times a day (QID) | ORAL | Status: DC | PRN

## 2012-04-09 MED ORDER — GLUCOSE BLOOD VI STRP: ORAL_STRIP | Status: DC

## 2012-04-09 MED ORDER — NAPROXEN 500 MG PO TABS: 500.0000 mg | ORAL_TABLET | Freq: Two times a day (BID) | ORAL | Status: DC

## 2012-04-09 MED ORDER — NOVOLOG MIX 70/30 FLEXPEN (70-30) 100 UNIT/ML ~~LOC~~ SUPN: 35.0000 [IU] | PEN_INJECTOR | Freq: Two times a day (BID) | SUBCUTANEOUS | Status: DC

## 2012-04-09 MED ORDER — LISINOPRIL 20 MG PO TABS: 20.0000 mg | ORAL_TABLET | Freq: Every day | ORAL | Status: DC

## 2012-04-09 NOTE — ED Notes (Signed)
Pt reports " i have been out of my insulin since July and my vision is blurred and my back hurts and I pee all the time"

## 2012-04-09 NOTE — ED Provider Notes (Signed)
History     CSN: [id number]  Arrival date & time 04/09/12  1351   First MD Initiated Contact with Patient 04/09/12 1507      Chief Complaint  Patient presents with  . Diabetes    (Consider location/radiation/quality/duration/timing/severity/associated sxs/prior treatment) Patient is a 56 y.o. female presenting with diabetes problem. The history is provided by the patient.  Diabetes She presents for her follow-up diabetic visit. She has type 2 diabetes mellitus. Her disease course has been stable. There are no hypoglycemic associated symptoms. Associated symptoms include blurred vision, foot paresthesias, polyuria and visual change. There are no hypoglycemic complications. Symptoms are stable. (Unknown) Risk factors for coronary artery disease include diabetes mellitus, hypertension and sedentary lifestyle. Current diabetic treatment includes insulin injections. She is compliant with treatment most of the time (ran out of insulin one week ago). Her weight is stable. She has not had a previous visit with a dietician. Home blood sugar record trend: ran out of strips, unable to evaluate. An ACE inhibitor/angiotensin II receptor blocker is being taken. She does not see a podiatrist.Eye exam is not current.    Past Medical History  Diagnosis Date  . Diabetes mellitus     Hb A1C = 12.6 on 05/15/11, managed on Novolog 70/30, 35 U qam, 25 U qpm  . Hypertension     poorly controlled  . Shortness of breath     Past Surgical History  Procedure Date  . Ankle fracture surgery 2007    Family History  Problem Relation Age of Onset  . Family history unknown: Yes    History  Substance Use Topics  . Smoking status: Current Every Day Smoker -- 0.5 packs/day for 25 years  . Smokeless tobacco: Not on file  . Alcohol Use: 7.2 oz/week    12 Cans of beer per week    OB History    Grav Para Term Preterm Abortions TAB SAB Ect Mult Living                  Review of Systems  Eyes: Positive  for blurred vision.  Genitourinary: Positive for polyuria.  All other systems reviewed and are negative.    Allergies  Review of patient's allergies indicates no known allergies.  Home Medications   Current Outpatient Rx  Name Route Sig Dispense Refill  . GOODY HEADACHE PO Oral Take 1 packet by mouth daily as needed. For pain     . GLUCOSE BLOOD VI STRP  Use as instructed 100 each 11  . ACCU-CHEK MULTICLIX LANCETS MISC  USE TO TEST BLOOD GLUCOSE 2-4 TIMES     DAILY BEFORE MEALS AND AT BEDTIME 1 each 6    Dispense as written.  Marland Kitchen LISINOPRIL 20 MG PO TABS Oral Take 1 tablet (20 mg total) by mouth daily. 30 tablet 1  . NAPROXEN 500 MG PO TABS Oral Take 1 tablet (500 mg total) by mouth 2 (two) times daily. 60 tablet 2  . NOVOLOG MIX 70/30 FLEXPEN (70-30) 100 UNIT/ML [place] SUSP Subcutaneous Inject 35 Units into the skin 2 (two) times daily. INJECT 45 UNITS EVERY MORNING AND 35    UNITS EVERY EVENING 10 mL 11    Dispense as written.  . TRAMADOL HCL 50 MG PO TABS Oral Take 1 tablet (50 mg total) by mouth every 6 (six) hours as needed for pain. 30 tablet 0    BP 182/74  Pulse 94  Temp 98.2 F (36.8 C) (Oral)  Resp 18  SpO2 96%  Physical Exam  Nursing note and vitals reviewed. Constitutional: She is oriented to person, place, and time. Vital signs are normal. She appears well-developed and well-nourished. She is active and cooperative.  HENT:  Head: Normocephalic.  Right Ear: Hearing, tympanic membrane, external ear and ear canal normal.  Left Ear: Hearing, tympanic membrane, external ear and ear canal normal.  Nose: Nose normal.  Mouth/Throat: Uvula is midline, oropharynx is clear and moist and mucous membranes are normal.  Eyes: Conjunctivae normal and EOM are normal. Pupils are equal, round, and reactive to light. No scleral icterus.  Neck: Trachea normal, normal range of motion and full passive range of motion without pain. Neck supple. No spinous process tenderness and no muscular  tenderness present.  Cardiovascular: Normal rate, regular rhythm, normal heart sounds, intact distal pulses and normal pulses.   Pulmonary/Chest: Effort normal and breath sounds normal.  Abdominal: Normal appearance and bowel sounds are normal. There is no hepatosplenomegaly. There is tenderness in the suprapubic area. There is no rebound.  Musculoskeletal:       Cervical back: Normal.       Thoracic back: Normal.       Lumbar back: Normal.  Lymphadenopathy:       Head (right side): No submental, no submandibular, no tonsillar, no preauricular, no posterior auricular and no occipital adenopathy present.       Head (left side): No submental, no submandibular, no tonsillar, no preauricular, no posterior auricular and no occipital adenopathy present.    She has no cervical adenopathy.  Neurological: She is alert and oriented to person, place, and time. She has normal strength and normal reflexes. No cranial nerve deficit or sensory deficit. Coordination and gait normal. GCS eye subscore is 4. GCS verbal subscore is 5. GCS motor subscore is 6.  Skin: Skin is warm and dry.  Psychiatric: She has a normal mood and affect. Her speech is normal and behavior is normal. Judgment and thought content normal. Cognition and memory are normal.    ED Course  Procedures (including critical care time)  Labs Reviewed  GLUCOSE, CAPILLARY - Abnormal; Notable for the following:    Glucose-Capillary 332 (*)     All other components within normal limits  POCT URINALYSIS DIP (DEVICE) - Abnormal; Notable for the following:    Glucose, UA 500 (*)     Bilirubin Urine SMALL (*)     Ketones, ur 15 (*)     Hgb urine dipstick LARGE (*)     Protein, ur >=300 (*)     All other components within normal limits  POCT I-STAT, CHEM 8 - Abnormal; Notable for the following:    Glucose, Bld 328 (*)     Hemoglobin 16.3 (*)     HCT 48.0 (*)     All other components within normal limits   No results found.   1. Diabetes  mellitus, type 2       MDM  Medications as prescribed, keep appointment with outpt clinic as scheduled.        Johnsie Kindred, NP 04/09/12 1605

## 2012-04-10 NOTE — ED Provider Notes (Signed)
Medical screening examination/treatment/procedure(s) were performed by non-physician practitioner and as supervising physician I was immediately available for consultation/collaboration.  Raynald Blend, MD 04/10/12 [phone]

## 2012-07-17 ENCOUNTER — Emergency Department (HOSPITAL_COMMUNITY)
Admission: EM | Admit: 2012-07-17 | Discharge: 2012-07-17 | Disposition: A | Discharge status: Home / Self Care | Payer: Medicaid Other | Attending: Emergency Medicine | Admitting: Emergency Medicine

## 2012-07-17 ENCOUNTER — Encounter (HOSPITAL_COMMUNITY): Payer: Self-pay | Admitting: *Deleted

## 2012-07-17 DIAGNOSIS — Z79899 Other long term (current) drug therapy: Secondary | ICD-10-CM | POA: Insufficient documentation

## 2012-07-17 DIAGNOSIS — B029 Zoster without complications: Secondary | ICD-10-CM | POA: Insufficient documentation

## 2012-07-17 DIAGNOSIS — I10 Essential (primary) hypertension: Secondary | ICD-10-CM | POA: Insufficient documentation

## 2012-07-17 DIAGNOSIS — Z8709 Personal history of other diseases of the respiratory system: Secondary | ICD-10-CM | POA: Insufficient documentation

## 2012-07-17 DIAGNOSIS — E119 Type 2 diabetes mellitus without complications: Secondary | ICD-10-CM | POA: Insufficient documentation

## 2012-07-17 DIAGNOSIS — F17200 Nicotine dependence, unspecified, uncomplicated: Secondary | ICD-10-CM | POA: Insufficient documentation

## 2012-07-17 MED ORDER — OXYCODONE-ACETAMINOPHEN 5-325 MG PO TABS
2.0000 | ORAL_TABLET | Freq: Once | ORAL | Status: AC
  Administered 2012-07-17: 2 via ORAL
  Filled 2012-07-17: qty 2

## 2012-07-17 MED ORDER — LISINOPRIL 10 MG PO TABS: 20.0000 mg | ORAL_TABLET | Freq: Every day | ORAL | Status: DC

## 2012-07-17 MED ORDER — HYDROCODONE-ACETAMINOPHEN 5-325 MG PO TABS: 2.0000 | ORAL_TABLET | Freq: Four times a day (QID) | ORAL | Status: DC | PRN

## 2012-07-17 MED ORDER — ACYCLOVIR 400 MG PO TABS: 400.0000 mg | ORAL_TABLET | Freq: Four times a day (QID) | ORAL | Status: DC

## 2012-07-17 NOTE — ED Notes (Addendum)
Pt states she ran out of her bp meds 4 days ago. A&Ox4, ambulatory, nad. Reports pain from bug bites started a week ago to left hip.

## 2012-07-17 NOTE — ED Provider Notes (Signed)
History   This chart was scribed for non-physician practitioner working with Juliet Rude. Rubin Payor, MD by Gerlean Ren, ED Scribe. This patient was seen in room TR10C/TR10C and the patient's care was started at 6:36 PM.    CSN: [id number]  Arrival date & time 07/17/12  1701   First MD Initiated Contact with Patient 07/17/12 1810      Chief Complaint  Patient presents with  . Hip Pain     The history is provided by the patient. No language interpreter was used.   [name] is a 57 y.o. female with h/o DM and HTN who presents to the Emergency Department complaining of several small bumps over right buttocks first noticed one week ago that she believes are the result of a bite she may have received while sleeping last week.  Pt did not see any specific insect and does not have any true reason to believe she was bitten.  Pt reports constant, burning pain in right buttocks with occasional mild shooting pain in right side back..  Pt is a current everyday smoker and reports alcohol use.   Past Medical History  Diagnosis Date  . Diabetes mellitus     Hb A1C = 12.6 on 05/15/11, managed on Novolog 70/30, 35 U qam, 25 U qpm  . Hypertension     poorly controlled  . Shortness of breath     Past Surgical History  Procedure Date  . Ankle fracture surgery 2007    No family history on file.  History  Substance Use Topics  . Smoking status: Current Every Day Smoker -- 0.5 packs/day for 25 years  . Smokeless tobacco: Not on file  . Alcohol Use: 7.2 oz/week    12 Cans of beer per week    No OB history provided.  Review of Systems  Skin: Positive for rash.    Allergies  Review of patient's allergies indicates no known allergies.  Home Medications   Current Outpatient Rx  Name  Route  Sig  Dispense  Refill  . GOODY HEADACHE PO   Oral   Take 1 packet by mouth daily as needed. For pain          . LISINOPRIL 20 MG PO TABS   Oral   Take 1 tablet (20 mg total) by mouth daily.  30 tablet   1   . NOVOLOG MIX 70/30 FLEXPEN (70-30) 100 UNIT/ML [place] SUSP   Subcutaneous   Inject 35 Units into the skin 2 (two) times daily. INJECT 45 UNITS EVERY MORNING AND 35    UNITS EVERY EVENING   10 mL   11     Dispense as written.     BP 205/113  Pulse 102  Temp 98.1 F (36.7 C) (Oral)  Resp 18  SpO2 100%  Physical Exam  Nursing note and vitals reviewed. Constitutional: She is oriented to person, place, and time. She appears well-developed and well-nourished. No distress.  HENT:  Head: Normocephalic and atraumatic.  Eyes: EOM are normal.  Neck: Neck supple. No tracheal deviation present.  Cardiovascular: Normal rate.   Pulmonary/Chest: Effort normal. No respiratory distress.  Abdominal: She exhibits no distension.  Musculoskeletal: Normal range of motion.  Neurological: She is alert and oriented to person, place, and time.  Skin: Skin is warm and dry.       Maculopapular lesions which have scabbed over on right hip characteristic of shingles in appearance   Psychiatric: She has a normal mood and affect. Her behavior  is normal.    ED Course  Procedures (including critical care time) DIAGNOSTIC STUDIES: Oxygen Saturation is 100% on room air, normal by my interpretation.    COORDINATION OF CARE: 6:43 PM- Informed pt of probable shingles which cannot be treated but pain can be managed with medication to be given here.  Informed pt to stay away from young children or those that are very sick so as to avoid potential spreading.  Advised follow-up with PCP.  1. Shingles       MDM  57 year old female with shingles. Will treat the patient with acyclovir, per her request, will additionally will give pain medicine, and refill the patient's lisinopril.  Encouraged the patient to follow-up with PCP to obtain a shingles vaccine.  Will recheck BP after patient receives pain meds.   7:09 PM BP 176/92  Stable and ready for discharge.  I personally performed the  services described in this documentation, which was scribed in my presence. The recorded information has been reviewed and is accurate.         Roxy Horseman, PA-C 07/17/12 1910

## 2012-07-17 NOTE — ED Provider Notes (Signed)
Medical screening examination/treatment/procedure(s) were performed by non-physician practitioner and as supervising physician I was immediately available for consultation/collaboration.  Renne Cornick R. Beonka Amesquita, MD 07/17/12 2334 

## 2012-07-17 NOTE — ED Notes (Signed)
Pt states she noticed 5 different knots to R lat buttock.  States they very painful and didn't itch until they were scabbing over.  C/o intense pain radiating down her lat thigh.  Hx of chicken pox.

## 2012-10-30 ENCOUNTER — Other Ambulatory Visit: Payer: Self-pay | Admitting: Internal Medicine

## 2012-10-30 DIAGNOSIS — Z1231 Encounter for screening mammogram for malignant neoplasm of breast: Secondary | ICD-10-CM

## 2012-12-04 ENCOUNTER — Ambulatory Visit: Payer: Medicaid Other

## 2012-12-13 ENCOUNTER — Ambulatory Visit: Payer: Medicaid Other

## 2013-01-16 ENCOUNTER — Other Ambulatory Visit: Payer: Self-pay

## 2014-03-17 ENCOUNTER — Other Ambulatory Visit: Payer: Self-pay | Admitting: Nurse Practitioner

## 2014-03-17 DIAGNOSIS — Z1231 Encounter for screening mammogram for malignant neoplasm of breast: Secondary | ICD-10-CM

## 2014-04-07 ENCOUNTER — Ambulatory Visit: Payer: Medicaid Other

## 2014-04-20 ENCOUNTER — Ambulatory Visit: Payer: Medicaid Other

## 2014-12-01 ENCOUNTER — Ambulatory Visit: Payer: Medicaid Other | Admitting: Internal Medicine

## 2015-04-02 ENCOUNTER — Other Ambulatory Visit: Payer: Self-pay | Admitting: Internal Medicine

## 2015-04-02 DIAGNOSIS — Z1231 Encounter for screening mammogram for malignant neoplasm of breast: Secondary | ICD-10-CM

## 2015-04-07 ENCOUNTER — Ambulatory Visit
Admission: RE | Admit: 2015-04-07 | Discharge: 2015-04-07 | Disposition: A | Discharge status: Home / Self Care | Payer: Medicaid Other | Source: Ambulatory Visit | Attending: Internal Medicine | Admitting: Internal Medicine

## 2015-04-07 ENCOUNTER — Other Ambulatory Visit: Payer: Self-pay | Admitting: Internal Medicine

## 2015-04-07 DIAGNOSIS — Z1231 Encounter for screening mammogram for malignant neoplasm of breast: Secondary | ICD-10-CM

## 2016-01-14 ENCOUNTER — Encounter (HOSPITAL_COMMUNITY): Payer: Self-pay | Admitting: Emergency Medicine

## 2016-01-14 ENCOUNTER — Emergency Department (HOSPITAL_COMMUNITY)
Admission: EM | Admit: 2016-01-14 | Discharge: 2016-01-14 | Disposition: A | Discharge status: Home / Self Care | Payer: Medicaid Other | Attending: Dermatology | Admitting: Dermatology

## 2016-01-14 ENCOUNTER — Emergency Department (HOSPITAL_COMMUNITY): Payer: Medicaid Other

## 2016-01-14 DIAGNOSIS — Z5321 Procedure and treatment not carried out due to patient leaving prior to being seen by health care provider: Secondary | ICD-10-CM | POA: Diagnosis not present

## 2016-01-14 DIAGNOSIS — R079 Chest pain, unspecified: Secondary | ICD-10-CM | POA: Insufficient documentation

## 2016-01-14 DIAGNOSIS — F17200 Nicotine dependence, unspecified, uncomplicated: Secondary | ICD-10-CM | POA: Insufficient documentation

## 2016-01-14 DIAGNOSIS — I10 Essential (primary) hypertension: Secondary | ICD-10-CM | POA: Insufficient documentation

## 2016-01-14 DIAGNOSIS — E119 Type 2 diabetes mellitus without complications: Secondary | ICD-10-CM | POA: Diagnosis not present

## 2016-01-14 LAB — I-STAT TROPONIN, ED: TROPONIN I, POC: 0.01 ng/mL (ref 0.00–0.08)

## 2016-01-14 LAB — CBC
HCT: 42 % (ref 36.0–46.0)
HEMOGLOBIN: 13.7 g/dL (ref 12.0–15.0)
MCH: 31.8 pg (ref 26.0–34.0)
MCHC: 32.6 g/dL (ref 30.0–36.0)
MCV: 97.4 fL (ref 78.0–100.0)
PLATELETS: 288 10*3/uL (ref 150–400)
RBC: 4.31 MIL/uL (ref 3.87–5.11)
RDW: 13.8 % (ref 11.5–15.5)
WBC: 9 10*3/uL (ref 4.0–10.5)

## 2016-01-14 LAB — BASIC METABOLIC PANEL
Anion gap: 4 — ABNORMAL LOW (ref 5–15)
BUN: 14 mg/dL (ref 6–20)
CALCIUM: 8.8 mg/dL — AB (ref 8.9–10.3)
CHLORIDE: 113 mmol/L — AB (ref 101–111)
CO2: 25 mmol/L (ref 22–32)
CREATININE: 1.2 mg/dL — AB (ref 0.44–1.00)
GFR calc Af Amer: 56 mL/min — ABNORMAL LOW (ref >60)
GFR calc non Af Amer: 48 mL/min — ABNORMAL LOW (ref >60)
GLUCOSE: 165 mg/dL — AB (ref 65–99)
Potassium: 4.3 mmol/L (ref 3.5–5.1)
Sodium: 142 mmol/L (ref 135–145)

## 2016-01-14 NOTE — ED Notes (Signed)
Pt. Came up to the front desk and stated, "I cannot stay, I have to go and get my grand babies off the bus."  I apologized for the wait and encouraged the pt. To stay.  She stated, "I told them my chest pain comes and goes and I do not have it and it is in my back."an  Pt. Stated, "I will come back later or in the morning."  I encouraged pt. To stay and reviewed the risks of leaving  With chest pain that it could be fatal.  She verbalized understanding .

## 2016-01-14 NOTE — ED Notes (Signed)
Pt sts left sided CP under left breast x 1 week; pt sts pain worse with inspiration; pt admits to crack cocaine use 3 days ago

## 2016-03-16 ENCOUNTER — Ambulatory Visit: Payer: Medicaid Other

## 2016-05-16 ENCOUNTER — Encounter (HOSPITAL_COMMUNITY): Payer: Self-pay | Admitting: Emergency Medicine

## 2016-05-16 ENCOUNTER — Emergency Department (HOSPITAL_COMMUNITY): Payer: Medicaid Other

## 2016-05-16 ENCOUNTER — Emergency Department (HOSPITAL_COMMUNITY)
Admission: EM | Admit: 2016-05-16 | Discharge: 2016-05-16 | Disposition: A | Discharge status: Home / Self Care | Payer: Medicaid Other | Attending: Emergency Medicine | Admitting: Emergency Medicine

## 2016-05-16 DIAGNOSIS — Z794 Long term (current) use of insulin: Secondary | ICD-10-CM | POA: Insufficient documentation

## 2016-05-16 DIAGNOSIS — F17200 Nicotine dependence, unspecified, uncomplicated: Secondary | ICD-10-CM | POA: Diagnosis not present

## 2016-05-16 DIAGNOSIS — E119 Type 2 diabetes mellitus without complications: Secondary | ICD-10-CM | POA: Diagnosis not present

## 2016-05-16 DIAGNOSIS — Y999 Unspecified external cause status: Secondary | ICD-10-CM | POA: Insufficient documentation

## 2016-05-16 DIAGNOSIS — S3992XA Unspecified injury of lower back, initial encounter: Secondary | ICD-10-CM | POA: Diagnosis present

## 2016-05-16 DIAGNOSIS — Y939 Activity, unspecified: Secondary | ICD-10-CM | POA: Diagnosis not present

## 2016-05-16 DIAGNOSIS — S39012A Strain of muscle, fascia and tendon of lower back, initial encounter: Secondary | ICD-10-CM | POA: Diagnosis not present

## 2016-05-16 DIAGNOSIS — R0781 Pleurodynia: Secondary | ICD-10-CM | POA: Diagnosis not present

## 2016-05-16 DIAGNOSIS — I10 Essential (primary) hypertension: Secondary | ICD-10-CM | POA: Diagnosis not present

## 2016-05-16 DIAGNOSIS — Y929 Unspecified place or not applicable: Secondary | ICD-10-CM | POA: Insufficient documentation

## 2016-05-16 DIAGNOSIS — X58XXXA Exposure to other specified factors, initial encounter: Secondary | ICD-10-CM | POA: Diagnosis not present

## 2016-05-16 MED ORDER — CYCLOBENZAPRINE HCL 10 MG PO TABS
5.0000 mg | ORAL_TABLET | Freq: Once | ORAL | Status: AC
  Administered 2016-05-16: 5 mg via ORAL
  Filled 2016-05-16: qty 1

## 2016-05-16 MED ORDER — IBUPROFEN 400 MG PO TABS
800.0000 mg | ORAL_TABLET | Freq: Four times a day (QID) | ORAL | 0 refills | Status: DC | PRN
Start: 2016-05-16 — End: 2016-06-20

## 2016-05-16 MED ORDER — CYCLOBENZAPRINE HCL 10 MG PO TABS: 10.0000 mg | ORAL_TABLET | Freq: Two times a day (BID) | ORAL | 0 refills | Status: DC | PRN
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
## 2016-05-16 NOTE — Discharge Instructions (Signed)
Your x-ray today shows you did not fracture any ribs, and you likely have a back strain. Please take the Flexeril as prescribed, avoid driving while taking it because it makes you sleepy, and ibuprofen as needed for your rib/back pain. Also, try heat and massage as directed in the attached documents for muscle strain, and follow up with your PCP as needed.

## 2016-06-06 ENCOUNTER — Emergency Department (HOSPITAL_COMMUNITY): Payer: Medicaid Other

## 2016-06-06 ENCOUNTER — Encounter (HOSPITAL_COMMUNITY): Payer: Self-pay

## 2016-06-06 ENCOUNTER — Inpatient Hospital Stay (HOSPITAL_COMMUNITY)
Admission: EM | Admit: 2016-06-06 | Discharge: 2016-06-09 | DRG: 064 | Disposition: A | Discharge status: Rehabilitation Facility | Payer: Medicaid Other | Attending: Neurology | Admitting: Neurology

## 2016-06-06 DIAGNOSIS — E1142 Type 2 diabetes mellitus with diabetic polyneuropathy: Secondary | ICD-10-CM | POA: Diagnosis present

## 2016-06-06 DIAGNOSIS — R471 Dysarthria and anarthria: Secondary | ICD-10-CM | POA: Diagnosis present

## 2016-06-06 DIAGNOSIS — F1410 Cocaine abuse, uncomplicated: Secondary | ICD-10-CM | POA: Diagnosis present

## 2016-06-06 DIAGNOSIS — R251 Tremor, unspecified: Secondary | ICD-10-CM | POA: Diagnosis present

## 2016-06-06 DIAGNOSIS — I169 Hypertensive crisis, unspecified: Secondary | ICD-10-CM | POA: Diagnosis present

## 2016-06-06 DIAGNOSIS — M549 Dorsalgia, unspecified: Secondary | ICD-10-CM | POA: Diagnosis present

## 2016-06-06 DIAGNOSIS — I613 Nontraumatic intracerebral hemorrhage in brain stem: Principal | ICD-10-CM | POA: Diagnosis present

## 2016-06-06 DIAGNOSIS — W19XXXA Unspecified fall, initial encounter: Secondary | ICD-10-CM | POA: Diagnosis present

## 2016-06-06 DIAGNOSIS — H538 Other visual disturbances: Secondary | ICD-10-CM | POA: Diagnosis present

## 2016-06-06 DIAGNOSIS — E669 Obesity, unspecified: Secondary | ICD-10-CM | POA: Diagnosis present

## 2016-06-06 DIAGNOSIS — R1310 Dysphagia, unspecified: Secondary | ICD-10-CM | POA: Diagnosis present

## 2016-06-06 DIAGNOSIS — Z23 Encounter for immunization: Secondary | ICD-10-CM | POA: Diagnosis not present

## 2016-06-06 DIAGNOSIS — R202 Paresthesia of skin: Secondary | ICD-10-CM | POA: Diagnosis present

## 2016-06-06 DIAGNOSIS — R569 Unspecified convulsions: Secondary | ICD-10-CM

## 2016-06-06 DIAGNOSIS — G936 Cerebral edema: Secondary | ICD-10-CM

## 2016-06-06 DIAGNOSIS — N189 Chronic kidney disease, unspecified: Secondary | ICD-10-CM | POA: Diagnosis not present

## 2016-06-06 DIAGNOSIS — I6789 Other cerebrovascular disease: Secondary | ICD-10-CM | POA: Diagnosis not present

## 2016-06-06 DIAGNOSIS — G8191 Hemiplegia, unspecified affecting right dominant side: Secondary | ICD-10-CM | POA: Diagnosis present

## 2016-06-06 DIAGNOSIS — Z9114 Patient's other noncompliance with medication regimen: Secondary | ICD-10-CM

## 2016-06-06 DIAGNOSIS — R4182 Altered mental status, unspecified: Secondary | ICD-10-CM | POA: Diagnosis present

## 2016-06-06 DIAGNOSIS — R1312 Dysphagia, oropharyngeal phase: Secondary | ICD-10-CM | POA: Diagnosis not present

## 2016-06-06 DIAGNOSIS — Z833 Family history of diabetes mellitus: Secondary | ICD-10-CM

## 2016-06-06 DIAGNOSIS — I10 Essential (primary) hypertension: Secondary | ICD-10-CM | POA: Diagnosis present

## 2016-06-06 DIAGNOSIS — R29810 Facial weakness: Secondary | ICD-10-CM | POA: Diagnosis present

## 2016-06-06 DIAGNOSIS — R29898 Other symptoms and signs involving the musculoskeletal system: Secondary | ICD-10-CM

## 2016-06-06 DIAGNOSIS — Y92009 Unspecified place in unspecified non-institutional (private) residence as the place of occurrence of the external cause: Secondary | ICD-10-CM | POA: Diagnosis not present

## 2016-06-06 DIAGNOSIS — G9340 Encephalopathy, unspecified: Secondary | ICD-10-CM | POA: Diagnosis present

## 2016-06-06 DIAGNOSIS — F1010 Alcohol abuse, uncomplicated: Secondary | ICD-10-CM | POA: Diagnosis present

## 2016-06-06 DIAGNOSIS — I610 Nontraumatic intracerebral hemorrhage in hemisphere, subcortical: Secondary | ICD-10-CM | POA: Diagnosis not present

## 2016-06-06 DIAGNOSIS — Z9119 Patient's noncompliance with other medical treatment and regimen: Secondary | ICD-10-CM

## 2016-06-06 DIAGNOSIS — E876 Hypokalemia: Secondary | ICD-10-CM | POA: Diagnosis present

## 2016-06-06 DIAGNOSIS — I619 Nontraumatic intracerebral hemorrhage, unspecified: Secondary | ICD-10-CM

## 2016-06-06 DIAGNOSIS — E1165 Type 2 diabetes mellitus with hyperglycemia: Secondary | ICD-10-CM | POA: Diagnosis present

## 2016-06-06 DIAGNOSIS — Z791 Long term (current) use of non-steroidal anti-inflammatories (NSAID): Secondary | ICD-10-CM

## 2016-06-06 DIAGNOSIS — F4323 Adjustment disorder with mixed anxiety and depressed mood: Secondary | ICD-10-CM | POA: Diagnosis not present

## 2016-06-06 DIAGNOSIS — G8929 Other chronic pain: Secondary | ICD-10-CM | POA: Diagnosis present

## 2016-06-06 DIAGNOSIS — Z79899 Other long term (current) drug therapy: Secondary | ICD-10-CM

## 2016-06-06 DIAGNOSIS — E785 Hyperlipidemia, unspecified: Secondary | ICD-10-CM | POA: Diagnosis present

## 2016-06-06 DIAGNOSIS — Z794 Long term (current) use of insulin: Secondary | ICD-10-CM

## 2016-06-06 DIAGNOSIS — F17210 Nicotine dependence, cigarettes, uncomplicated: Secondary | ICD-10-CM | POA: Diagnosis present

## 2016-06-06 DIAGNOSIS — Z823 Family history of stroke: Secondary | ICD-10-CM

## 2016-06-06 DIAGNOSIS — Z79891 Long term (current) use of opiate analgesic: Secondary | ICD-10-CM

## 2016-06-06 DIAGNOSIS — R4781 Slurred speech: Secondary | ICD-10-CM | POA: Diagnosis present

## 2016-06-06 DIAGNOSIS — Z6836 Body mass index (BMI) 36.0-36.9, adult: Secondary | ICD-10-CM

## 2016-06-06 DIAGNOSIS — Z8249 Family history of ischemic heart disease and other diseases of the circulatory system: Secondary | ICD-10-CM

## 2016-06-06 LAB — CBG MONITORING, ED
GLUCOSE-CAPILLARY: 199 mg/dL — AB (ref 65–99)
GLUCOSE-CAPILLARY: 480 mg/dL — AB (ref 65–99)
Glucose-Capillary: 540 mg/dL (ref 65–99)

## 2016-06-06 LAB — COMPREHENSIVE METABOLIC PANEL
ALT: 14 U/L (ref 14–54)
AST: 17 U/L (ref 15–41)
Albumin: 2.5 g/dL — ABNORMAL LOW (ref 3.5–5.0)
Alkaline Phosphatase: 54 U/L (ref 38–126)
Anion gap: 10 (ref 5–15)
BUN: 11 mg/dL (ref 6–20)
CHLORIDE: 101 mmol/L (ref 101–111)
CO2: 25 mmol/L (ref 22–32)
Calcium: 9 mg/dL (ref 8.9–10.3)
Creatinine, Ser: 1.74 mg/dL — ABNORMAL HIGH (ref 0.44–1.00)
GFR, EST AFRICAN AMERICAN: 36 mL/min — AB (ref >60)
GFR, EST NON AFRICAN AMERICAN: 31 mL/min — AB (ref >60)
Glucose, Bld: 650 mg/dL (ref 65–99)
POTASSIUM: 3 mmol/L — AB (ref 3.5–5.1)
Sodium: 136 mmol/L (ref 135–145)
Total Bilirubin: 0.4 mg/dL (ref 0.3–1.2)
Total Protein: 7.7 g/dL (ref 6.5–8.1)

## 2016-06-06 LAB — URINE MICROSCOPIC-ADD ON

## 2016-06-06 LAB — URINALYSIS, ROUTINE W REFLEX MICROSCOPIC
Bilirubin Urine: NEGATIVE
Glucose, UA: >1000 mg/dL — AB
Ketones, ur: NEGATIVE mg/dL
Leukocytes, UA: NEGATIVE
NITRITE: NEGATIVE
Protein, ur: >300 mg/dL — AB
SPECIFIC GRAVITY, URINE: 1.027 (ref 1.005–1.030)
pH: 7 (ref 5.0–8.0)

## 2016-06-06 LAB — RAPID URINE DRUG SCREEN, HOSP PERFORMED
AMPHETAMINES: NOT DETECTED
BARBITURATES: NOT DETECTED
Benzodiazepines: NOT DETECTED
Cocaine: POSITIVE — AB
OPIATES: NOT DETECTED
TETRAHYDROCANNABINOL: NOT DETECTED

## 2016-06-06 LAB — CBC WITH DIFFERENTIAL/PLATELET
BASOS ABS: 0 10*3/uL (ref 0.0–0.1)
Basophils Relative: 0 %
EOS ABS: 0.1 10*3/uL (ref 0.0–0.7)
EOS PCT: 1 %
HCT: 46.7 % — ABNORMAL HIGH (ref 36.0–46.0)
Hemoglobin: 16.1 g/dL — ABNORMAL HIGH (ref 12.0–15.0)
LYMPHS PCT: 23 %
Lymphs Abs: 2.5 10*3/uL (ref 0.7–4.0)
MCH: 32.3 pg (ref 26.0–34.0)
MCHC: 34.5 g/dL (ref 30.0–36.0)
MCV: 93.6 fL (ref 78.0–100.0)
MONO ABS: 0.6 10*3/uL (ref 0.1–1.0)
Monocytes Relative: 6 %
Neutro Abs: 7.7 10*3/uL (ref 1.7–7.7)
Neutrophils Relative %: 70 %
PLATELETS: 315 10*3/uL (ref 150–400)
RBC: 4.99 MIL/uL (ref 3.87–5.11)
RDW: 14.1 % (ref 11.5–15.5)
WBC: 10.8 10*3/uL — ABNORMAL HIGH (ref 4.0–10.5)

## 2016-06-06 LAB — APTT: aPTT: 28 seconds (ref 24–36)

## 2016-06-06 LAB — LIPID PANEL
CHOLESTEROL: 157 mg/dL (ref 0–200)
HDL: 33 mg/dL — ABNORMAL LOW (ref >40)
LDL CALC: 81 mg/dL (ref 0–99)
TRIGLYCERIDES: 214 mg/dL — AB (ref <150)
Total CHOL/HDL Ratio: 4.8 RATIO
VLDL: 43 mg/dL — ABNORMAL HIGH (ref 0–40)

## 2016-06-06 LAB — PROTIME-INR
INR: 1.08
Prothrombin Time: 14 seconds (ref 11.4–15.2)

## 2016-06-06 LAB — I-STAT TROPONIN, ED: Troponin i, poc: 0.01 ng/mL (ref 0.00–0.08)

## 2016-06-06 MED ORDER — ADULT MULTIVITAMIN W/MINERALS CH
1.0000 | ORAL_TABLET | Freq: Every day | ORAL | Status: DC
  Administered 2016-06-07 – 2016-06-09 (×3): 1 via ORAL
  Filled 2016-06-06 (×3): qty 1

## 2016-06-06 MED ORDER — MORPHINE SULFATE (PF) 4 MG/ML IV SOLN: 1.0000 mg | INTRAVENOUS | Status: DC | PRN

## 2016-06-06 MED ORDER — NICARDIPINE HCL IN NACL 20-0.86 MG/200ML-% IV SOLN
0.0000 mg/h | INTRAVENOUS | Status: DC
  Administered 2016-06-06: 7.5 mg/h via INTRAVENOUS
  Administered 2016-06-07: 5 mg/h via INTRAVENOUS
  Filled 2016-06-06 (×2): qty 200

## 2016-06-06 MED ORDER — VITAMIN B-1 100 MG PO TABS
100.0000 mg | ORAL_TABLET | Freq: Every day | ORAL | Status: DC
  Administered 2016-06-08 – 2016-06-09 (×2): 100 mg via ORAL
  Filled 2016-06-06 (×2): qty 1

## 2016-06-06 MED ORDER — LORAZEPAM 2 MG/ML IJ SOLN: 0.0000 mg | Freq: Two times a day (BID) | INTRAMUSCULAR | Status: DC

## 2016-06-06 MED ORDER — LORAZEPAM 1 MG PO TABS: 1.0000 mg | ORAL_TABLET | Freq: Four times a day (QID) | ORAL | Status: AC | PRN

## 2016-06-06 MED ORDER — ACETAMINOPHEN 650 MG RE SUPP: 650.0000 mg | RECTAL | Status: DC | PRN

## 2016-06-06 MED ORDER — SODIUM CHLORIDE 0.9 % IV BOLUS (SEPSIS)
1000.0000 mL | Freq: Once | INTRAVENOUS | Status: AC
  Administered 2016-06-06: 1000 mL via INTRAVENOUS

## 2016-06-06 MED ORDER — PANTOPRAZOLE SODIUM 40 MG IV SOLR
40.0000 mg | Freq: Every day | INTRAVENOUS | Status: DC
  Administered 2016-06-06: 40 mg via INTRAVENOUS
  Filled 2016-06-06: qty 40

## 2016-06-06 MED ORDER — SODIUM CHLORIDE 0.9 % IV SOLN
1750.0000 mg | Freq: Once | INTRAVENOUS | Status: AC
  Administered 2016-06-06: 1750 mg via INTRAVENOUS
  Filled 2016-06-06: qty 17.5

## 2016-06-06 MED ORDER — NICARDIPINE HCL IN NACL 20-0.86 MG/200ML-% IV SOLN
3.0000 mg/h | Freq: Once | INTRAVENOUS | Status: AC
  Administered 2016-06-06: 3 mg/h via INTRAVENOUS

## 2016-06-06 MED ORDER — SODIUM CHLORIDE 0.9 % IV SOLN
1000.0000 mg | Freq: Two times a day (BID) | INTRAVENOUS | Status: DC
  Administered 2016-06-07: 1000 mg via INTRAVENOUS
  Filled 2016-06-06 (×2): qty 10

## 2016-06-06 MED ORDER — LORAZEPAM 2 MG/ML IJ SOLN
0.0000 mg | Freq: Four times a day (QID) | INTRAMUSCULAR | Status: AC
  Administered 2016-06-06: 1 mg via INTRAVENOUS
  Administered 2016-06-07: 2 mg via INTRAVENOUS
  Filled 2016-06-06 (×2): qty 1

## 2016-06-06 MED ORDER — NICOTINE 14 MG/24HR TD PT24
14.0000 mg | MEDICATED_PATCH | Freq: Every day | TRANSDERMAL | Status: DC
  Administered 2016-06-06 – 2016-06-09 (×4): 14 mg via TRANSDERMAL
  Filled 2016-06-06 (×4): qty 1

## 2016-06-06 MED ORDER — ACETAMINOPHEN 325 MG PO TABS: 650.0000 mg | ORAL_TABLET | ORAL | Status: DC | PRN

## 2016-06-06 MED ORDER — STROKE: EARLY STAGES OF RECOVERY BOOK
Freq: Once | Status: AC
  Administered 2016-06-07: 1
  Filled 2016-06-06: qty 1

## 2016-06-06 MED ORDER — NICARDIPINE HCL IN NACL 20-0.86 MG/200ML-% IV SOLN
3.0000 mg/h | Freq: Once | INTRAVENOUS | Status: AC
  Administered 2016-06-06: 7.5 mg/h via INTRAVENOUS
  Filled 2016-06-06: qty 200

## 2016-06-06 MED ORDER — LORAZEPAM 2 MG/ML IJ SOLN
INTRAMUSCULAR | Status: AC
  Filled 2016-06-06: qty 1

## 2016-06-06 MED ORDER — LORAZEPAM 2 MG/ML IJ SOLN: 1.0000 mg | Freq: Once | INTRAMUSCULAR | Status: DC

## 2016-06-06 MED ORDER — LORAZEPAM 2 MG/ML IJ SOLN: 1.0000 mg | Freq: Four times a day (QID) | INTRAMUSCULAR | Status: AC | PRN

## 2016-06-06 MED ORDER — NICARDIPINE HCL IN NACL 20-0.86 MG/200ML-% IV SOLN
3.0000 mg/h | Freq: Once | INTRAVENOUS | Status: AC
  Administered 2016-06-06: 5 mg/h via INTRAVENOUS
  Filled 2016-06-06: qty 200

## 2016-06-06 MED ORDER — INSULIN ASPART 100 UNIT/ML ~~LOC~~ SOLN
0.0000 [IU] | SUBCUTANEOUS | Status: DC
  Administered 2016-06-06 – 2016-06-07 (×2): 20 [IU] via SUBCUTANEOUS
  Administered 2016-06-07: 11 [IU] via SUBCUTANEOUS
  Administered 2016-06-07: 4 [IU] via SUBCUTANEOUS
  Administered 2016-06-07 (×2): 7 [IU] via SUBCUTANEOUS
  Administered 2016-06-08: 15 [IU] via SUBCUTANEOUS
  Filled 2016-06-06 (×3): qty 1

## 2016-06-06 MED ORDER — LORAZEPAM 2 MG/ML IJ SOLN
1.0000 mg | Freq: Once | INTRAMUSCULAR | Status: AC
  Administered 2016-06-06: 1 mg via INTRAVENOUS

## 2016-06-06 MED ORDER — THIAMINE HCL 100 MG/ML IJ SOLN
100.0000 mg | Freq: Every day | INTRAMUSCULAR | Status: DC
  Administered 2016-06-07: 100 mg via INTRAVENOUS
  Filled 2016-06-06: qty 2

## 2016-06-06 MED ORDER — SODIUM CHLORIDE 0.9 % IV SOLN
INTRAVENOUS | Status: DC
  Administered 2016-06-06 – 2016-06-08 (×5): via INTRAVENOUS

## 2016-06-06 MED ORDER — FOLIC ACID 1 MG PO TABS
1.0000 mg | ORAL_TABLET | Freq: Every day | ORAL | Status: DC
  Administered 2016-06-07 – 2016-06-09 (×3): 1 mg via ORAL
  Filled 2016-06-06 (×3): qty 1

## 2016-06-06 NOTE — ED Provider Notes (Signed)
[place] DEPT Provider Note   CSN: [id number] Arrival date & time: 06/06/16  1436     History   Chief Complaint Chief Complaint  Patient presents with  . Altered Mental Status    pt last Wed. began to lose ability in her L hand and was unable to ambulate without assist as per her caregiver since than pt's L hand has began to have tremors with facial involvement that strted last night     HPI [name] is a 60 y.o. female.  HPI   Pt with hx HTN, DM p/w progressive neurologic deficits that began one week ago.  Per EMS, caregiver reported pt had difficulty walking and slurred speech that began one week ago.  3-4 days ago she developed left hand weakness and tremor.  She came to the ED today for evaluation and was alert and oriented, speaking clearly then after returning from the bathroom suddenly became less responsive, continues to try to tell [place] her birthdate, does not follow commands.   Level V caveat for altered mental status.    Past Medical History:  Diagnosis Date  . Diabetes mellitus    Hb A1C = 12.6 on 05/15/11, managed on Novolog 70/30, 35 U qam, 25 U qpm  . Hypertension    poorly controlled  . Shortness of breath     Patient Active Problem List   Diagnosis Date Noted  . ICH (intracerebral hemorrhage) ([place]) 06/06/2016  . Chest pain, musculoskeletal 05/16/2011  . Vaginal candidiasis 05/16/2011  . Hypertension 05/16/2011  . Diabetes mellitus, type 2 ([place]) 05/16/2011  . CHEST PAIN 10/01/2009  . CELLULITIS AND ABSCESS OF LEG EXCEPT FOOT 08/31/2009  . INSOMNIA 07/13/2008  . MRSA 04/17/2008  . NECK MASS 04/17/2008  . COCAINE ABUSE 11/01/2007  . OBESITY NOS 07/14/2006  . DENTAL CARIES 07/14/2006  . DIABETES MELLITUS, TYPE II 04/26/2006  . ALCOHOL ABUSE 04/26/2006  . TOBACCO ABUSE 04/26/2006  . DEPRESSION 04/26/2006  . HYPERTENSION 04/26/2006  . GERD 04/26/2006  . LOW BACK PAIN 04/26/2006    Past Surgical History:  Procedure Laterality Date  .  ANKLE FRACTURE SURGERY  2007    OB History    No data available       Home Medications    Prior to Admission medications   Medication Sig Start Date End Date Taking? Authorizing Provider  acyclovir (ZOVIRAX) 400 MG tablet Take 1 tablet (400 mg total) by mouth 4 (four) times daily. 07/17/12   Montine Circle, PA-C  amLODipine (NORVASC) 10 MG tablet Take 10 mg by mouth daily.    Historical Provider, MD  cyclobenzaprine (FLEXERIL) 10 MG tablet Take 1 tablet (10 mg total) by mouth 2 (two) times daily as needed for muscle spasms. 05/16/16   Daryl F de Villier II, PA  furosemide (LASIX) 20 MG tablet Take 20 mg by mouth daily as needed (for swollen legs).  04/04/16   Historical Provider, MD  gabapentin (NEURONTIN) 300 MG capsule Take 600 mg by mouth 4 (four) times daily.    Historical Provider, MD  HYDROcodone-acetaminophen (NORCO/VICODIN) 5-325 MG per tablet Take 2 tablets by mouth every 6 (six) hours as needed for pain. 07/17/12   Montine Circle, PA-C  ibuprofen (ADVIL,MOTRIN) 400 MG tablet Take 2 tablets (800 mg total) by mouth every 6 (six) hours as needed. For pain Patient taking differently: Take 800 mg by mouth every 6 (six) hours as needed (for pain).  05/16/16   Daryl F de Villier II, PA  lisinopril (PRINIVIL,ZESTRIL) 10  MG tablet Take 2 tablets (20 mg total) by mouth daily. Patient not taking: Reported on 06/06/2016 07/17/12   Montine Circle, PA-C  lisinopril (PRINIVIL,ZESTRIL) 20 MG tablet Take 1 tablet (20 mg total) by mouth daily. Patient not taking: Reported on 06/06/2016 04/09/12   Awilda Metro, NP  lisinopril-hydrochlorothiazide (PRINZIDE,ZESTORETIC) 20-12.5 MG tablet Take 1 tablet by mouth daily. 04/02/16   Historical Provider, MD  NOVOLOG MIX 70/30 FLEXPEN (70-30) 100 UNIT/ML FlexPen Inject 40 Units into the skin 2 (two) times daily. MORNING AND BEDTIME 04/10/16   Historical Provider, MD  simvastatin (ZOCOR) 10 MG tablet Take 10 mg by mouth at bedtime. 04/10/16   Historical Provider,  MD  traMADol (ULTRAM) 50 MG tablet Take 50 mg by mouth daily as needed (for pain).    Historical Provider, MD  traZODone (DESYREL) 100 MG tablet Take 100 mg by mouth at bedtime. 04/10/16   Historical Provider, MD    Family History No family history on file.  Social History Social History  Substance Use Topics  . Smoking status: Current Every Day Smoker    Packs/day: 0.50    Years: 25.00  . Smokeless tobacco: Never Used  . Alcohol use 7.2 oz/week    12 Cans of beer per week     Allergies   Patient has no known allergies.   Review of Systems Review of Systems  Unable to perform ROS: Mental status change     Physical Exam Updated Vital Signs BP 186/89   Pulse 103   Temp 98.5 F (36.9 C) (Oral)   Resp 22   Ht 5\' 4"  (1.626 m)   Wt 90.7 kg   SpO2 94%   BMI 34.33 kg/m   Physical Exam  Constitutional: She appears well-developed and well-nourished. No distress.  Eyes open, looks around without focusing on a person  HENT:  Head: Normocephalic and atraumatic.  Neck: Neck supple.  Cardiovascular: Normal rate and regular rhythm.   Pulmonary/Chest: Effort normal and breath sounds normal. No respiratory distress. She has no wheezes. She has no rales.  Abdominal: Soft. She exhibits no distension. There is no tenderness. There is no rebound and no guarding.  Neurological: She is alert.  Does not follow any commands.  Does not respond to questioning.    Skin: She is not diaphoretic.  Nursing note and vitals reviewed.    ED Treatments / Results  Labs (all labs ordered are listed, but only abnormal results are displayed) Labs Reviewed  CBC WITH DIFFERENTIAL/PLATELET - Abnormal; Notable for the following:       Result Value   WBC 10.8 (*)    Hemoglobin 16.1 (*)    HCT 46.7 (*)    All other components within normal limits  COMPREHENSIVE METABOLIC PANEL - Abnormal; Notable for the following:    Potassium 3.0 (*)    Glucose, Bld 650 (*)    Creatinine, Ser 1.74 (*)     Albumin 2.5 (*)    GFR calc non Af Amer 31 (*)    GFR calc Af Amer 36 (*)    All other components within normal limits  URINALYSIS, ROUTINE W REFLEX MICROSCOPIC (NOT AT [facility]) - Abnormal; Notable for the following:    Glucose, UA >1000 (*)    Hgb urine dipstick SMALL (*)    Protein, ur >300 (*)    All other components within normal limits  URINE MICROSCOPIC-ADD ON - Abnormal; Notable for the following:    Squamous Epithelial / LPF 0-5 (*)  Bacteria, UA FEW (*)    All other components within normal limits  CBG MONITORING, ED - Abnormal; Notable for the following:    Glucose-Capillary >600 (*)    All other components within normal limits  RAPID URINE DRUG SCREEN, HOSP PERFORMED  LIPID PANEL  HEMOGLOBIN A1C    EKG  EKG Interpretation  Date/Time:  Tuesday June 06 2016 14:44:45 EST Ventricular Rate:  91 PR Interval:    QRS Duration: 96 QT Interval:  404 QTC Calculation: 498 R Axis:   66 Text Interpretation:  Sinus rhythm Ventricular premature complex Minimal ST depression, inferior leads Borderline prolonged QT interval No significant change since last tracing Confirmed by [facility] MD, Corene Cornea [phone]) on 06/06/2016 4:54:29 PM       Radiology Ct Head Wo Contrast  Result Date: 06/06/2016 CLINICAL DATA:  Confusion with left arm weakness and aphasia EXAM: CT HEAD WITHOUT CONTRAST TECHNIQUE: Contiguous axial images were obtained from the base of the skull through the vertex without intravenous contrast. COMPARISON:  None. FINDINGS: Brain: There is mild diffuse atrophy. There is focal hemorrhage arising in the left pons which extends slightly to the right of midline in the lower pons region. There is mild surrounding edema in this area. This hemorrhage at its maximum measures 1.3 x 1.1 cm. No other hemorrhage is seen. There is no well-defined mass. There is no extra-axial fluid collection or midline shift. There is mild small vessel disease in the centra semiovale bilaterally. Vascular:  There is no hyperdense vessel. There is calcification in each carotid siphon region. Skull: The bony calvarium appears intact. Sinuses/Orbits: There is opacification of a right ethmoid air cell. Visualized paranasal sinuses elsewhere clear. There is rightward deviation of the nasal septum. Orbits appear symmetric bilaterally. Other: Visualized mastoid air cells are clear. IMPRESSION: Acute hemorrhage arising from the left pons and extending across the midline to the medial right lower pons near in the inferior pontine region. Suspect hemorrhagic infarct, most likely secondary to hypertension. There is mild atrophy with mild periventricular small vessel disease. No other hemorrhage evident. There are foci of arterial vascular calcification in the carotid siphon regions. There is right ethmoid sinus disease. There is a deviated nasal septum. Critical Value/emergent results were called by telephone at the time of interpretation on 06/06/2016 at 3:58 pm to [facility], [facility], who verbally acknowledged these results. Electronically Signed   By: Lowella Grip III M.D.   On: 06/06/2016 16:02   Dg Chest Portable 1 View  Result Date: 06/06/2016 CLINICAL DATA:  Left hand tremors, left-sided facial tremor, difficulty ambulating EXAM: PORTABLE CHEST 1 VIEW COMPARISON:  Chest x-ray of 05/16/2016 FINDINGS: No pneumonia or effusion is seen. Mediastinal and hilar contours are unremarkable. The heart is within upper limits of normal. No acute bony abnormality is seen. There are degenerative changes in the shoulders present. IMPRESSION: No active lung disease.  Degenerative change in both shoulders. Electronically Signed   By: Ivar Drape M.D.   On: 06/06/2016 17:15    Procedures Procedures (including critical care time)  Medications Ordered in ED Medications  LORazepam (ATIVAN) injection 1 mg (not administered)   stroke: mapping our early stages of recovery book (not administered)  acetaminophen (TYLENOL) tablet 650  mg (not administered)    Or  acetaminophen (TYLENOL) suppository 650 mg (not administered)  pantoprazole (PROTONIX) injection 40 mg (not administered)  morphine 4 MG/ML injection 1-4 mg (not administered)  nicardipine (CARDENE) 20mg  in 0.86% saline 246ml IV infusion (0.1 mg/ml) (5 mg/hr Intravenous  Rate/Dose Change 06/06/16 1710)  levETIRAcetam (KEPPRA) 1,000 mg in sodium chloride 0.9 % 100 mL IVPB (not administered)  insulin aspart (novoLOG) injection 0-20 Units (not administered)  LORazepam (ATIVAN) tablet 1 mg (not administered)    Or  LORazepam (ATIVAN) injection 1 mg (not administered)  thiamine (VITAMIN B-1) tablet 100 mg (not administered)    Or  thiamine (B-1) injection 100 mg (not administered)  folic acid (FOLVITE) tablet 1 mg (not administered)  multivitamin with minerals tablet 1 tablet (not administered)  LORazepam (ATIVAN) injection 0-4 mg (not administered)    Followed by  LORazepam (ATIVAN) injection 0-4 mg (not administered)  0.9 %  sodium chloride infusion (not administered)  nicotine (NICODERM CQ - dosed in mg/24 hours) patch 14 mg (not administered)  sodium chloride 0.9 % bolus 1,000 mL (1,000 mLs Intravenous New Bag/Given 06/06/16 1515)  LORazepam (ATIVAN) injection 1 mg (1 mg Intravenous Given 06/06/16 1517)  nicardipine (CARDENE) 20mg  in 0.86% saline 269ml IV infusion (0.1 mg/ml) (7.5 mg/hr Intravenous Rate/Dose Change 06/06/16 1733)  levETIRAcetam (KEPPRA) 1,750 mg in sodium chloride 0.9 % 100 mL IVPB (0 mg Intravenous Stopped 06/06/16 1656)     Initial Impression / Assessment and Plan / ED Course  I have reviewed the triage vital signs and the nursing notes.  Pertinent labs & imaging results that were available during my care of the patient were reviewed by me and considered in my medical decision making (see chart for details).  Clinical Course as of Jun 07 1739  Tue Jun 06, 2016  1503 Pt seen initially with Dr Dayna Barker in room.  Discussed plan.    [EW]    [id number] Pt appears to have had partial seizure with uncontrolled twitching of the mouth and left arm.  Ativan given with resolution.   [EW]  [id number] Pt with pontine bleed on head CT.  Discussed with Dr Dayna Barker.  Medications per discussion with Dr Dayna Barker.  Call placed to neurology.    [EW]    Clinical Course User Index [EW] Clayton Bibles, PA-C    Pt with hx HTN, DM, brought in by EMS for intermittent or progressive stroke-like symptoms over the past week.  She develop a new mental status change change per EMS after her arrival and I was called to the bedside.  Dr Dayna Barker was also called to the bedside and arrived shortly after I did.  Pt was perseverating on her birthdate and not communicative or interactive otherwise, did not respond to commands.  Orders placed at that time.  cbg noted to be "high"  Shortly thereafter I was called to the room again as pt was having what appeared to be a seizure.  Pt had been taking promptly to CT at our request but starting having seizure-like activity and was returned to the department where she was medicated and taken back to CT.  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  Dr Dayna Barker and I immediately initiated treatment and Dr Dayna Barker called and spoke with neurohospitalist Dr Shon Hale.  Please see their notes for further information.    Final Clinical Impressions(s) / ED Diagnoses   Final diagnoses:  ICH (intracerebral hemorrhage) ([place])  ICH (intracerebral hemorrhage) ([place])    New Prescriptions New Prescriptions   No medications on file     Clayton Bibles, PA-C 06/06/16 [place], MD 06/08/16 [phone]

## 2016-06-06 NOTE — ED Notes (Signed)
Pt is resting appears in no distress remains on monitor with Cardene infusing and NS infusing will continue to monitor

## 2016-06-06 NOTE — ED Provider Notes (Signed)
Medical screening examination/treatment/procedure(s) were conducted as a shared visit with non-physician practitioner(s) and myself.  I personally evaluated the patient during the encounter.  60 year old female with approximately week worth of intermittent slurred speech, left arm weakness however seems to constantly worse last night and started having some associated shaking of her left arm associated with left facial twitching as well. Apparently got worse this morning so EMS was called on their arrival patient is alert and oriented and able to walk however prior to our evaluation patient had worsening symptoms. On exam patient is perseverating about her birthday but is not able answer follow commands otherwise. Blood pressure is high. Concern for head bleed in the patient so taken back to CT immediately but had seizure in CT so ativan given.  CT obtained and e/o pontine bleed. Nicardipine started. Will consult neuro and give anti-epileptics.  CRITICAL CARE Performed by: Merrily Pew Total critical care time: 35 minutes Critical care time was exclusive of separately billable procedures and treating other patients. Critical care was necessary to treat or prevent imminent or life-threatening deterioration. Critical care was time spent personally by me on the following activities: development of treatment plan with patient and/or surrogate as well as nursing, discussions with consultants, evaluation of patient's response to treatment, examination of patient, obtaining history from patient or surrogate, ordering and performing treatments and interventions, ordering and review of laboratory studies, ordering and review of radiographic studies, pulse oximetry and re-evaluation of patient's condition.    EKG Interpretation  Date/Time:  Tuesday June 06 2016 14:44:45 EST Ventricular Rate:  91 PR Interval:    QRS Duration: 96 QT Interval:  404 QTC Calculation: 498 R Axis:   66 Text  Interpretation:  Sinus rhythm Ventricular premature complex Minimal ST depression, inferior leads Borderline prolonged QT interval No significant change since last tracing Confirmed by [facility] MD, Montrail Mehrer [phone]) on 06/06/2016 4:54:29 PM         Merrily Pew, MD 06/08/16 [id number]

## 2016-06-06 NOTE — ED Notes (Signed)
Pt's CBG result read "HI". Raquel Sarna - PA, Dr. Dayna Barker and Rachel Bo - RN informed.

## 2016-06-06 NOTE — ED Notes (Signed)
After EMS assisted the patient with standing to transfer to the bed she stated she was dizzy. After sitting down on the bed the patient became very confused and began repeatedly saying her birth date in response to any question that was asked. MD notified. Patient began having twitching and muscle contractions in her left arm/hand and on the left side of her face.

## 2016-06-06 NOTE — ED Notes (Signed)
CT reports patient became confused and combative during attempted scan.  Required multiple staff members to keep patient from falling on the floor.

## 2016-06-06 NOTE — H&P (Addendum)
Neurology Admission H&P  Requesting provider: Merrily Pew, MD  CC: trouble speaking, left arm weakness, headache  HPI: This is a 45-yo RH woman who was brought to the [facility] ED by EMS. History is obtained from the patient who is a fair historian. Additional information is obtained from review of the patient's medical record.   Per notes, EMS was called to the patient's home because she was having tremors and contractions of the L hand and face. She reported to them that she was having trouble walking with weakness of the L hand since last night. Per ED MD note, EMS reported that the patient was alert and oriented and able to walk for them. ED PA note indicates that the caregiver reported progressive difficult walking with slurred speech for the past week. She developed weakness and tremors in the left hand 3-4 days ago. In the ED, she was initially alert and oriented and able to get off the gurney to go to the bathroom. However, after returning from the bathroom she had a sudden decrease in level of arousal, unable to follow commands and repeating her birthday anytime someone asked her a question. There was concern for ICH and she was sent for a STAT CT of the head. While in the scanner she reportedly had another seizure and was given Ativan. CTH showed a pontine hemorrhage. She has been hypertensive in the ED with SBP 151-202. She has been placed on a nicardipine drip and received a Keppra load of 1750 mg.  Her mentation has steadily improved after her seizure. On my assessment, she is alert and able to answer questions. She tells me that she has been having trouble speaking and using her left hand for 3-4 weeks. In addition, she has had trouble walking and reports a fall at home two days ago that occurred because she was unable to control her leg. She has a non-specific headache with significant nausea, no vomiting. She has been having trouble swallowing and gets choked by both solids and liquids.  She has blurred vision but denies double vision or frank vision loss. She states that symptoms got worse today and this prompted her to call 911.   She denies any prior history of stroke or hemorrhage. She has no prior history of seizure. She admits to using crack cocaine every other day and last used yesterday.   PMH:  Past Medical History:  Diagnosis Date  . Diabetes mellitus    Hb A1C = 12.6 on 05/15/11, managed on Novolog 70/30, 35 U qam, 25 U qpm  . Hypertension    poorly controlled  . Shortness of breath     PSH:  Past Surgical History:  Procedure Laterality Date  . ANKLE FRACTURE SURGERY  2007    Family history: Her father suffered three strokes after the age of 24. Her brother has HTN. Her aunt has DM.   Social history: She reports that she is single. She has adult children. She is currently not working and was previously employed as a Psychiatric nurse. She states that she smokes crack cocaine whenever she can find it, about every other day, and last used 06/05/17. She denies any other illicit drug use and denies prescription med abuse. She states that she drinks about half a gallon of beer every day. She also drinks liquor and wine on occasion.   Current inpatient meds:  Current Facility-Administered Medications  Medication Dose Route Frequency Provider Last Rate Last Dose  .  stroke: mapping our early  stages of recovery book   Does not apply Once Darrel Reach, MD      . acetaminophen (TYLENOL) tablet 650 mg  650 mg Oral Q4H PRN Darrel Reach, MD       Or  . acetaminophen (TYLENOL) suppository 650 mg  650 mg Rectal Q4H PRN Darrel Reach, MD      . insulin aspart (novoLOG) injection 0-20 Units  0-20 Units Subcutaneous Q4H Darrel Reach, MD      . Derrill Memo ON 06/07/2016] levETIRAcetam (KEPPRA) 1,000 mg in sodium chloride 0.9 % 100 mL IVPB  1,000 mg Intravenous Q12H Darrel Reach, MD      . LORazepam (ATIVAN) injection 1 mg  1 mg Intravenous Once  [place], PA-C      . LORazepam (ATIVAN) injection 1 mg  1 mg Intravenous Once Emily West, PA-C      . morphine 4 MG/ML injection 1-4 mg  1-4 mg Intravenous Q1H PRN Darrel Reach, MD      . nicardipine (CARDENE) 20mg  in 0.86% saline 294ml IV infusion (0.1 mg/ml)  0-15 mg/hr Intravenous Continuous Darrel Reach, MD      . pantoprazole (PROTONIX) injection 40 mg  40 mg Intravenous QHS Darrel Reach, MD       Current Outpatient Prescriptions  Medication Sig Dispense Refill  . acyclovir (ZOVIRAX) 400 MG tablet Take 1 tablet (400 mg total) by mouth 4 (four) times daily. 50 tablet 0  . amLODipine (NORVASC) 10 MG tablet Take 10 mg by mouth daily.    . cyclobenzaprine (FLEXERIL) 10 MG tablet Take 1 tablet (10 mg total) by mouth 2 (two) times daily as needed for muscle spasms. 10 tablet 0  . furosemide (LASIX) 20 MG tablet Take 20 mg by mouth daily as needed (for swollen legs).   0  . gabapentin (NEURONTIN) 300 MG capsule Take 600 mg by mouth 4 (four) times daily.    Marland Kitchen HYDROcodone-acetaminophen (NORCO/VICODIN) 5-325 MG per tablet Take 2 tablets by mouth every 6 (six) hours as needed for pain. 15 tablet 0  . ibuprofen (ADVIL,MOTRIN) 400 MG tablet Take 2 tablets (800 mg total) by mouth every 6 (six) hours as needed. For pain (Patient taking differently: Take 800 mg by mouth every 6 (six) hours as needed (for pain). ) 30 tablet 0  . insulin aspart protamine-insulin aspart (NOVOLOG MIX 70/30 FLEXPEN) (70-30) 100 UNIT/ML injection Inject into the skin.     Marland Kitchen lisinopril (PRINIVIL,ZESTRIL) 10 MG tablet Take 2 tablets (20 mg total) by mouth daily. 60 tablet 0  . lisinopril (PRINIVIL,ZESTRIL) 20 MG tablet Take 1 tablet (20 mg total) by mouth daily. 30 tablet 1  . lisinopril-hydrochlorothiazide (PRINZIDE,ZESTORETIC) 20-12.5 MG tablet Take 1 tablet by mouth daily.  0  . NOVOLOG MIX 70/30 FLEXPEN (70-30) 100 UNIT/ML FlexPen   0  . simvastatin (ZOCOR) 10 MG tablet Take 10 mg by mouth at bedtime.  0   . traMADol (ULTRAM) 50 MG tablet Take 50 mg by mouth daily as needed (for pain).    . traZODone (DESYREL) 100 MG tablet Take 100 mg by mouth at bedtime.  0    Allergies: No Known Allergies  ROS: As per HPI. A full 14-point review of systems was performed and is otherwise notable for dry mouth. She reports some urinary urge incontinence. Remainder of ROS is unremarkable.   PE:  BP (!) 202/95   Pulse 91   Temp 98.5 F (36.9 C) (Oral)   Resp  21   Ht 5\' 4"  (1.626 m)   Wt 90.7 kg (200 lb)   SpO2 96%   BMI 34.33 kg/m   General: WDWN AA woman lying on ED gurney. She is AAO x4. She has moderate dysarthria with speech mostly intelligible. No aphasia. Follows commands briskly. Affect is restricted. Comportment is normal.  HEENT: Normocephalic. Neck supple without LAD. Mucous membranes slightly dry, OP clear. She is edentulous. Sclerae anicteric. Mild conjunctival injection.  CV: Regular, no murmur. Carotid pulses full and symmetric, no bruits. Distal pulses 2+ and symmetric.  Lungs: CTAB on anterior exam.  Abdomen: Soft, non-distended, non-tender. Bowel sounds present x4.  Extremities: No C/C/E. Neuro:  CN: Pupils are equal and round. They are symmetrically reactive from 3-->2 mm. Visual fields are full. EOMI with breakup of smooth pursuits, no nystagmus. No reported diplopia. Facial sensation is intact to light touch but decreased to pinprick on the R side. She gives poor effort with testing facial strength with absent Bell's phenomenon bilaterally. Her face appears grossly symmetric, however. Hearing is intact to conversational voice. Palate elevates symmetrically. Uvula is not well-visualized due to her large tongue. Voice is normal in tone, pitch and quality. Bilateral SCM and trapezii are 5/5. Tongue is midline with normal bulk and mobility.  Motor: Normal bulk, tone, and strength with the exception of 4/5 L wrist extension, finger extension, and grip. L triceps are 4+/5. She has difficulty  controlling her RLE but does not have overt weakness in that limb. She had one episode of tonic contraction of the L hand with associated rhythmic shaking of the L arm that lasted about 30 seconds with no alteration in her awareness and no postictal state.  Sensation: Light touch and pinprick are reduced on the R side.  DTRs: 3+, brisker in the L arm. Toes downgoing on the R, mute on the L. Coordination: Finger-to-nose is mildly unsteady on the L. Heel-to-shin is mildly  Dysmetric on the R. Finger taps are slower on the L.     Labs:  Lab Results  Component Value Date   WBC 10.8 (H) 06/06/2016   HGB 16.1 (H) 06/06/2016   HCT 46.7 (H) 06/06/2016   PLT 315 06/06/2016   GLUCOSE 650 (HH) 06/06/2016   CHOL 167 05/16/2011   TRIG 147 05/16/2011   HDL 42 05/16/2011   LDLCALC 96 05/16/2011   ALT 14 06/06/2016   AST 17 06/06/2016   NA 136 06/06/2016   K 3.0 (L) 06/06/2016   CL 101 06/06/2016   CREATININE 1.74 (H) 06/06/2016   BUN 11 06/06/2016   CO2 25 06/06/2016   TSH 3.675 05/15/2011   HGBA1C 12.6 (H) 05/15/2011   MICROALBUR 0.94 02/12/2009   UA notable for glucose >1000, small Hgb, protein >300  Imaging:  I have personally and independently reviewed the [facility] without contrast from today. This is limited by patient motion. This shows an area of acute hemorrhage in the pons. This mainly affects the left pons, extending longitudinally from the superior pons near the pontomesencephalic junction to the mid-pons. Though mainly left-sided, the hemorrhage does appear to extend slightly across the midline in places to involve the medial aspect of the right pons. There is mild surrounding vasogenic edema. This hematoma measures 1.3 x 1.1 cm. There is moderate diffuse generalized atrophy with hydrocephalus ex-vacuo. Moderate chronic small vessel ischemic disease is present.  Assessment and Plan: 1. 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 Other  potential considerations include AVM, aneurysm, trauma, and complication from anticoagulation or antiplatelet therapy but these are much less likely. Coags and urine drug screen are pending. Monitor blood pressure, goal SBP 140-160 mmHg. Avoid antiplatelet agents and NSAIDs for the 1-2 weeks. Avoid therapeutic systemic anticoagulation for the next 3-4 weeks. Will order MRI brain and MRA head for further evaluation. If hemorrhage is stable after 24 hours, can institute heparin/LMWH for DVT prophylaxis. Use SCDs in the meantime. Avoid fever and hyperglycemia as these are associated with worse neurologic prognosis. Avoid hypotonic IVF to minimize the risk of exacerbating cerebral edema. Frequent neuro checks.   ICH score: 1  2. Possible seizure: She has had episodic shaking of the left face and arm with some alteration in her level of awareness in the ED. These are concerning for possible partial seizures. Seizure would not be expected with a pontine hemorrhage, however. She has severely elevated blood glucose >600 which can produce hyperkinetic movements and partial seizures. MRI brain and EEG will be ordered for further evaluation. She received a load of Keppra in the ED and this will be continued at 1000 mg q12 hrs. Seizure precautions. Correct hyperglycemia.   4. LUE weakness: This is acute and mild, predominately involving the L hand. This is likely related to the pontine hemorrhage though cannot exclude a component of Todd's paresis given possible recurrent partial seizures. OT, rehab as needed.   5. RLE weakness: This is acute, mainly impaired motor control rather than frank weakness. This is likely related to her pontine hemorrhage. PT, rehab as needed.   6. R hemisensory loss: This is acute, due to her pontine hemorrhage. PT, OT, rehab.   7. Dysarthria: She has moderate dysphagia that is acute by report. This is probably mostly the result of her pontine ICH but other potential contributors could  include severe hyperglycemia and edentulousness. She also reports some trouble swallowing. She will be kept NPO until cleared by SLP. Correct glucose.   8. Headache: This is acute in the setting of an acute hemorrhage, though her metabolic derangements could be contributing. Optimize metabolic status. Symptomatic pain relief with PR APAP and IV morphine as needed.   9. Hyperglycemia: She has been non-compliant with her outpatient regimen with longstanding history of same. I will start her on sliding scale for now since she will be NPO and aim to avoid rapid correction of her glucose to minimize risk of osmotic demyelination.   10. Hypertension: This is acute-on-chronic with the current severe elevation likely the result of her ICH. Continue nicardipine drip with initial goal SBP 140-160 mmHg. Can gradually start to reintroduce her outpatient meds over the next several days--suspect non-compliance with these as well.   11. DM: Chronic, non-compliant with meds. Will gradually lower her glucose as above.   12. Cocaine abuse: She reports frequent use of crack cocaine, last used yesterday. She would likely benefit from substance abuse counseling prior to discharge.   11. Alcohol abuse: This is chronic. We will need to monitor closely for s/s of withdrawal and will place on CIWA protocol. Substance abuse counseling prior to discharge.   This was discussed with the patient and she is in agreement with the plan as noted. She was given the opportunity to ask questions and these were addressed to the best of my ability.   The stroke team will assume care of this patient starting 06/07/16.   This patient is critically ill and at significant risk of neurological worsening, death and care  requires constant monitoring of vital signs, hemodynamics,respiratory and cardiac monitoring, neurological assessment, discussion with family, other specialists and medical decision making of high complexity. A total of 100  minutes of critical care time was spent on this case.

## 2016-06-06 NOTE — ED Triage Notes (Signed)
EMS called out for L hand tremors and hand contractions with some L sided facial tremors pt has been having difficulty ambulating and L hand weakness since last night pt upon getting her in to bed appears increasingly confused and having some slurred speech

## 2016-06-07 ENCOUNTER — Encounter (HOSPITAL_COMMUNITY): Payer: Self-pay | Admitting: Radiology

## 2016-06-07 ENCOUNTER — Inpatient Hospital Stay (HOSPITAL_COMMUNITY): Payer: Medicaid Other

## 2016-06-07 DIAGNOSIS — I613 Nontraumatic intracerebral hemorrhage in brain stem: Principal | ICD-10-CM

## 2016-06-07 LAB — BASIC METABOLIC PANEL
Anion gap: 11 (ref 5–15)
BUN: 8 mg/dL (ref 6–20)
CHLORIDE: 114 mmol/L — AB (ref 101–111)
CO2: 21 mmol/L — AB (ref 22–32)
CREATININE: 1.38 mg/dL — AB (ref 0.44–1.00)
Calcium: 8.4 mg/dL — ABNORMAL LOW (ref 8.9–10.3)
GFR calc Af Amer: 47 mL/min — ABNORMAL LOW (ref >60)
GFR calc non Af Amer: 41 mL/min — ABNORMAL LOW (ref >60)
Glucose, Bld: 167 mg/dL — ABNORMAL HIGH (ref 65–99)
POTASSIUM: 2.8 mmol/L — AB (ref 3.5–5.1)
Sodium: 146 mmol/L — ABNORMAL HIGH (ref 135–145)

## 2016-06-07 LAB — GLUCOSE, CAPILLARY
Glucose-Capillary: 123 mg/dL — ABNORMAL HIGH (ref 65–99)
Glucose-Capillary: 241 mg/dL — ABNORMAL HIGH (ref 65–99)
Glucose-Capillary: 244 mg/dL — ABNORMAL HIGH (ref 65–99)
Glucose-Capillary: 267 mg/dL — ABNORMAL HIGH (ref 65–99)
Glucose-Capillary: 401 mg/dL — ABNORMAL HIGH (ref 65–99)
Glucose-Capillary: 70 mg/dL (ref 65–99)

## 2016-06-07 LAB — MRSA PCR SCREENING: MRSA BY PCR: NEGATIVE

## 2016-06-07 LAB — MAGNESIUM: Magnesium: 1.5 mg/dL — ABNORMAL LOW (ref 1.7–2.4)

## 2016-06-07 MED ORDER — NICARDIPINE HCL IN NACL 20-0.86 MG/200ML-% IV SOLN
3.0000 mg/h | INTRAVENOUS | Status: DC
  Administered 2016-06-07: 7.5 mg/h via INTRAVENOUS
  Administered 2016-06-07 – 2016-06-08 (×2): 5 mg/h via INTRAVENOUS
  Filled 2016-06-07: qty 200
  Filled 2016-06-07: qty 400

## 2016-06-07 MED ORDER — STARCH (THICKENING) PO POWD
ORAL | Status: DC | PRN
  Filled 2016-06-07: qty 227

## 2016-06-07 MED ORDER — PANTOPRAZOLE SODIUM 40 MG PO TBEC
40.0000 mg | DELAYED_RELEASE_TABLET | Freq: Every day | ORAL | Status: DC
  Administered 2016-06-07 – 2016-06-08 (×2): 40 mg via ORAL
  Filled 2016-06-07 (×2): qty 1

## 2016-06-07 MED ORDER — HALOPERIDOL LACTATE 5 MG/ML IJ SOLN
2.0000 mg | Freq: Once | INTRAMUSCULAR | Status: AC
  Administered 2016-06-07: 2 mg via INTRAVENOUS
  Filled 2016-06-07: qty 1

## 2016-06-07 MED ORDER — CHLORHEXIDINE GLUCONATE 0.12 % MT SOLN
15.0000 mL | Freq: Two times a day (BID) | OROMUCOSAL | Status: DC
  Administered 2016-06-07 – 2016-06-09 (×5): 15 mL via OROMUCOSAL
  Filled 2016-06-07 (×5): qty 15

## 2016-06-07 MED ORDER — LEVETIRACETAM 500 MG PO TABS
1000.0000 mg | ORAL_TABLET | Freq: Two times a day (BID) | ORAL | Status: DC
  Administered 2016-06-07 – 2016-06-09 (×5): 1000 mg via ORAL
  Filled 2016-06-07 (×5): qty 2

## 2016-06-07 MED ORDER — RESOURCE THICKENUP CLEAR PO POWD
ORAL | Status: DC | PRN
  Filled 2016-06-07: qty 125

## 2016-06-07 MED ORDER — INFLUENZA VAC SPLIT QUAD 0.5 ML IM SUSY
0.5000 mL | PREFILLED_SYRINGE | INTRAMUSCULAR | Status: AC
  Administered 2016-06-08: 0.5 mL via INTRAMUSCULAR
  Filled 2016-06-07: qty 0.5

## 2016-06-07 MED ORDER — POTASSIUM CHLORIDE CRYS ER 20 MEQ PO TBCR
40.0000 meq | EXTENDED_RELEASE_TABLET | Freq: Once | ORAL | Status: AC
  Administered 2016-06-07: 40 meq via ORAL
  Filled 2016-06-07: qty 2

## 2016-06-07 MED ORDER — HALOPERIDOL LACTATE 5 MG/ML IJ SOLN
INTRAMUSCULAR | Status: AC
  Filled 2016-06-07: qty 1

## 2016-06-07 MED ORDER — INSULIN GLARGINE 100 UNIT/ML ~~LOC~~ SOLN
7.0000 [IU] | Freq: Every day | SUBCUTANEOUS | Status: DC
  Administered 2016-06-07: 7 [IU] via SUBCUTANEOUS
  Filled 2016-06-07 (×2): qty 0.07

## 2016-06-07 MED ORDER — ORAL CARE MOUTH RINSE
15.0000 mL | Freq: Two times a day (BID) | OROMUCOSAL | Status: DC
  Administered 2016-06-07 – 2016-06-09 (×3): 15 mL via OROMUCOSAL

## 2016-06-07 NOTE — Progress Notes (Signed)
Bedside EEG completed, results pending.  Uneventful EEG.

## 2016-06-07 NOTE — Progress Notes (Signed)
Modified Barium Swallow Progress Note  Patient Details  Name: [name] MRN: [id number] Date of Birth: 08-21-1955  Today's Date: 06/07/2016  Modified Barium Swallow completed.  Full report located under Chart Review in the Imaging Section.  Brief recommendations include the following:  Clinical Impression  Decreased lingual manipulation with prolonged mastication and transit with solid texture. Mild-mod sensory based pharyngeal dysphagia exacerbated by what appears to be large bony spurs on C4-5. Laryngeal penetration before and during swallow with thin and nectar not improved with chin tuck posture. Penetrates were transient and remained in vestibule (minimal) without awareness. Honey thick did not yield significant residue during study but may increase if pt lethargic. Recommending nectar thick followed by cough/hard throat clear, Dys 2 texture, pills whole in applesauce, no straws and full supervision initially. ST will continue to follow.     Swallow Evaluation Recommendations       SLP Diet Recommendations: Dysphagia 2 (Fine chop) solids;Nectar thick liquid   Liquid Administration via: Cup;No straw   Medication Administration: Whole meds with puree   Supervision: Patient able to self feed;Full supervision/cueing for compensatory strategies   Compensations: Slow rate;Small sips/bites   Postural Changes: Remain semi-upright after after feeds/meals (Comment)   Oral Care Recommendations: Oral care BID        [name] 06/07/2016,1:53 PM   Orbie Pyo [place].Ed [employer] [phone]

## 2016-06-07 NOTE — Procedures (Signed)
ELECTROENCEPHALOGRAM REPORT  Date of Study: 06/07/2016  Patient's Name: [name] MRN: [id number] Date of Birth: [birth year]  Referring Provider: Darrel Reach, MD  Clinical History: 60 year old woman with hemorrhagic brainstem infarct, left hand tremor and decreased arousal  Medications:  0.9 % sodium chloride infusion  L1 acetaminophen (TYLENOL) suppository 650 mg  L1 acetaminophen (TYLENOL) tablet 650 mg   chlorhexidine (PERIDEX) 0.12 % solution 15 mL   folic acid (FOLVITE) tablet 1 mg   haloperidol lactate (HALDOL) injection 2 mg   Influenza vac split quadrivalent PF (FLUARIX) injection 0.5 mL   insulin aspart (novoLOG) injection 0-20 Units   levETIRAcetam (KEPPRA) 1,000 mg in sodium chloride 0.9 % 100 mL IVPB  L2 LORazepam (ATIVAN) injection 0-4 mg  L2 LORazepam (ATIVAN) injection 0-4 mg   LORazepam (ATIVAN) injection 1 mg  L3 LORazepam (ATIVAN) injection 1 mg  L3 LORazepam (ATIVAN) tablet 1 mg   MEDLINE mouth rinse   morphine 4 MG/ML injection 1-4 mg   multivitamin with minerals tablet 1 tablet   nicardipine (CARDENE) 20mg  in 0.86% saline 259ml IV infusion (0.1 mg/ml)   nicotine (NICODERM CQ - dosed in mg/24 hours) patch 14 mg   pantoprazole (PROTONIX) injection 40 mg  L4 thiamine (B-1) injection 100 mg  L4 thiamine (VITAMIN B-1) tablet 100 mg   Technical Summary: A multichannel digital EEG recording measured by the international 10-20 system with electrodes applied with paste and impedances below 5000 ohms performed in our laboratory with EKG monitoring in an awake and drowsy patient.  Hyperventilation and photic stimulation were not performed.  The digital EEG was referentially recorded, reformatted, and digitally filtered in a variety of bipolar and referential montages for optimal display.    Description: The patient is awake and drowsy during the recording.  During maximal wakefulness, there is a symmetric, medium voltage 8 Hz posterior dominant rhythm  that attenuates with eye opening.  The record is symmetric.  During drowsiness and sleep, there is an increase in theta slowing of the background.  Vertex waves and symmetric sleep spindles were seen.  There were no epileptiform discharges or electrographic seizures seen.    EKG lead was unremarkable.  Impression: This awake and drowsy EEG is normal.    Clinical Correlation: A normal EEG does not exclude a clinical diagnosis of epilepsy.  If further clinical questions remain, prolonged EEG may be helpful.  Clinical correlation is advised.   Metta Clines, DO

## 2016-06-07 NOTE — Care Management Note (Signed)
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  PTA, pt independent, lives with significant other.                      Action/Plan: PT/OT recommending CIR prior to dc home.  MD, please consult rehab MD for consult if you agree.    Expected Discharge Date:                  Expected Discharge Plan:  [place]  In-House Referral:     Discharge planning Services  CM Consult  Post Acute Care Choice:    Choice offered to:     DME Arranged:    DME Agency:     HH Arranged:    [place] Agency:     Status of Service:  In process, will continue to follow  If discussed at Long Length of Stay Meetings, dates discussed:    Additional Comments:  Reinaldo Raddle, RN, BSN  Trauma/Neuro ICU Case Manager [phone]

## 2016-06-07 NOTE — Progress Notes (Signed)
OT Cancellation Note  Patient Details Name: [name] MRN: [id number] DOB: Jun 30, 1956   Cancelled Treatment:    Reason Eval/Treat Not Completed: Patient not medically ready (bedrest)  [place], OTR/L  [id number] 06/07/2016 06/07/2016, 9:41 AM

## 2016-06-07 NOTE — Evaluation (Signed)
Clinical/Bedside Swallow Evaluation Patient Details  Name: [name] MRN: [id number] Date of Birth: [birth year]  Today's Date: 06/07/2016 Time: SLP Start Time (ACUTE ONLY): 0807 SLP Stop Time (ACUTE ONLY): 0834 SLP Time Calculation (min) (ACUTE ONLY): 11 min  Past Medical History:  Past Medical History:  Diagnosis Date  . Diabetes mellitus    Hb A1C = 12.6 on 05/15/11, managed on Novolog 70/30, 35 U qam, 25 U qpm  . Hypertension    poorly controlled  . Shortness of breath    Past Surgical History:  Past Surgical History:  Procedure Laterality Date  . ANKLE FRACTURE SURGERY  2007   HPI:  85-yo woman with history of DM, poorly controlled,HTN, SOB, currently uses crack cocaine admitted with tremors/contractions of the L hand and face, trouble walking with weakness of the L hand. Pt had seizure while in CT scanner. CT showed a pontine hemorrhage. Per MD notes sh has been having trouble swallowing and gets choked by both solids and liquids. CXR No active lung disease. Degenerative change in both shoulders.   Assessment / Plan / Recommendation Clinical Impression  Pt with pontine CVA states difficulty swallowing just prior to admission with nasal emission. Baseline congested cough. Absent overt s/s aspiration however decreased sensation with silent airway compromise likely. MBS scheduled today at 11:30 to fully assess.       Aspiration Risk   (mod-severe)    Diet Recommendation NPO        Other  Recommendations Oral Care Recommendations: Oral care QID   Follow up Recommendations Inpatient Rehab      Frequency and Duration            Prognosis        Swallow Study   General HPI: 61-yo woman with history of DM, poorly controlled,HTN, SOB, currently uses crack cocaine admitted with tremors/contractions of the L hand and face, trouble walking with weakness of the L hand. Pt had seizure while in CT scanner. CT showed a pontine hemorrhage. Per MD notes sh has been having  trouble swallowing and gets choked by both solids and liquids. CXR No active lung disease. Degenerative change in both shoulders. Type of Study: Bedside Swallow Evaluation Previous Swallow Assessment:  (none found) Diet Prior to this Study: NPO Temperature Spikes Noted: No Respiratory Status: Room air History of Recent Intubation: No Behavior/Cognition: Alert;Cooperative;Pleasant mood Oral Cavity Assessment: Dry Oral Care Completed by SLP: Yes Oral Cavity - Dentition: Edentulous (no) Vision: Functional for self-feeding Self-Feeding Abilities: Able to feed self;Needs set up;Needs assist Patient Positioning: Upright in bed Baseline Vocal Quality: Low vocal intensity Volitional Cough: Weak Volitional Swallow: Able to elicit    Oral/Motor/Sensory Function Overall Oral Motor/Sensory Function: Generalized oral weakness Lingual Symmetry:  (slight deviation to right) Velum:  (decreased elevation) Mandible: Within Functional Limits   Ice Chips Ice chips: Within functional limits   Thin Liquid Thin Liquid: Within functional limits    Nectar Thick Nectar Thick Liquid: Not tested   Honey Thick Honey Thick Liquid: Not tested   Puree Puree: Within functional limits   Solid   GO   Solid: Impaired Oral Phase Impairments: Reduced lingual movement/coordination Oral Phase Functional Implications: Prolonged oral transit        [name] 06/07/2016,8:53 AM  [name].Ed [employer] [phone]

## 2016-06-07 NOTE — Progress Notes (Signed)
PT Cancellation Note  Patient Details Name: [name] MRN: [id number] DOB: 1956-03-27   Cancelled Treatment:    Reason Eval/Treat Not Completed: Patient not medically ready Pt on bedrest. Will await increase in activity orders prior to initiation of PT evaluation. Will follow.  Marguarite Arbour A Alyannah Sanks 06/07/2016, 8:33 AM  [name], PT, DPT [phone]

## 2016-06-07 NOTE — Evaluation (Signed)
Occupational Therapy Evaluation Patient Details Name: [name] MRN: [id number] DOB: 1955-09-06 Today's Date: 06/07/2016    History of Present Illness 60 yo female with hx of HTN, DM presents from home with tremors and contractions of the L hand and face, difficulty walking and slurred speech. 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 Pt currently with functional limitiations due to the deficits listed below (see OT problem list). PTA was caregiver for boyfriend [name] completing all his adls with him. She does not work or drive currently.  Pt will benefit from skilled OT to increase their independence and safety with adls and balance to allow discharge CIR. Relies on RN for boyfriend to do all the driving for the household.      Follow Up Recommendations  CIR    Equipment Recommendations   (defer to next venue)    Recommendations for Other Services Rehab consult     Precautions / Restrictions Precautions Precautions: Fall Precaution Comments: watch BP  Restrictions Weight Bearing Restrictions: No      Mobility Bed Mobility Overal bed mobility: Needs Assistance Bed Mobility: Supine to Sit     Supine to sit: Mod assist     General bed mobility comments: (A) to elevate trunk from bed surface. bil LE (A) to eob.   Transfers Overall transfer level: Needs assistance Equipment used: Rolling walker (2 wheeled) Transfers: Sit to/from Stand Sit to Stand: Mod assist Stand pivot transfers: Min assist       General transfer comment: Assist to power to standing with pt pulling up on RW. Trembling in BLEs upon standing. SPT to chair with Min A for balance due to bil knee instability.     Balance Overall balance assessment: Needs assistance Sitting-balance support: Feet supported;No upper extremity supported Sitting balance-Leahy Scale: Good     Standing  balance support: During functional activity Standing balance-Leahy Scale: Poor Standing balance comment: Reliant on BUEs for support in standing.                             ADL Overall ADL's : Needs assistance/impaired Eating/Feeding: Supervision/ safety Eating/Feeding Details (indicate cue type and reason): repeatly asking for juice or ice Grooming: Wash/dry face;Wash/dry hands;Set up                   Toilet Transfer: Moderate assistance   Toileting- Clothing Manipulation and Hygiene: Minimal assistance Toileting - Clothing Manipulation Details (indicate cue type and reason): (A) for steady. standing without (A) requires bil Ue to power off 3n1       General ADL Comments: pt declined 3n1 with therapy but requesting it with RN arrival at end of session. Ot (A)ing with transfer     Vision Vision Assessment?: Vision impaired- to be further tested in functional context Additional Comments: to be further assessd. functionally able to scan tray and locate all items   Perception     Praxis      Pertinent Vitals/Pain Pain Assessment: 0-10 Pain Score: 3  Faces Pain Scale: Hurts a little bit Pain Location: head Pain Descriptors / Indicators: Headache Pain Intervention(s): Monitored during session     Hand Dominance Right   Extremity/Trunk Assessment Upper Extremity Assessment Upper Extremity Assessment: RUE deficits/detail;LUE deficits/detail RUE Deficits / Details: numbness compared to R. 4 out 5 MMT. able to hold [place]  and place in mouth   Lower Extremity Assessment Lower Extremity Assessment: Defer to PT evaluation RLE Deficits / Details: Grossly ~3/5 throughout except 2/5 hip flexion. LLE Sensation: decreased light touch   Cervical / Trunk Assessment Cervical / Trunk Assessment: Other exceptions (rounded shoulders)   Communication Communication Communication: Other (comment) (raspy voice quality)   Cognition Arousal/Alertness:  Awake/alert Behavior During Therapy: WFL for tasks assessed/performed Overall Cognitive Status: Impaired/Different from baseline Area of Impairment: Awareness;Safety/judgement     Memory: Decreased short-term memory   Safety/Judgement: Decreased awareness of safety;Decreased awareness of deficits Awareness: Emergent   General Comments: Pt requires cues for safety. pt repeating same questions in session even after being provided an answer   General Comments       Exercises       Shoulder Instructions      Home Living Family/patient expects to be discharged to:: Private residence Living Arrangements: Spouse/significant other Available Help at Discharge: Family Type of Home: House Home Access: [place]: One level     Bathroom Shower/Tub: Occupational psychologist: [place]: None (all DME is for boyfriend )      Lives With: Significant other    Prior Functioning/Environment Level of Independence: Independent        Comments: helping take care of boyfriend that she has to dress and bath. [name] ( boyfriend) RN comes Monday - Friday to help his caregiver        OT Problem List: Decreased strength;Decreased activity tolerance;Impaired balance (sitting and/or standing);Decreased safety awareness;Decreased knowledge of use of DME or AE;Decreased knowledge of precautions;Obesity;Pain   OT Treatment/Interventions: Self-care/ADL training;Therapeutic exercise;Neuromuscular education;Energy conservation;DME and/or AE instruction;Therapeutic activities;Cognitive remediation/compensation;Visual/perceptual remediation/compensation;Patient/family education;Balance training    OT Goals(Current goals can be found in the care plan section) Acute Rehab OT Goals Patient Stated Goal: "to get back to normal" OT Goal Formulation: With patient Time For Goal Achievement: 06/21/16 Potential to Achieve Goals: Good  OT Frequency: Min 2X/week    Barriers to D/C:            Co-evaluation PT/OT/SLP Co-Evaluation/Treatment: Yes Reason for Co-Treatment: Complexity of the patient's impairments (multi-system involvement);For patient/therapist safety   OT goals addressed during session: ADL's and self-care;Strengthening/ROM      End of Session Equipment Utilized During Treatment: Rolling walker;Gait belt Nurse Communication: Mobility status;Precautions  Activity Tolerance: Patient tolerated treatment well Patient left: in chair;with chair alarm set;with call bell/phone within reach;with nursing/sitter in room   Time: 1330-1405 OT Time Calculation (min): 35 min Charges:  OT General Charges $OT Visit: 1 Procedure OT Evaluation $OT Eval Moderate Complexity: 1 Procedure G-Codes:    Peri Maris 06-15-16, 2:11 PM    Jeri Modena   OTR/L Pager: [phone] Office: [phone] .

## 2016-06-07 NOTE — Evaluation (Signed)
Speech Language Pathology Evaluation Patient Details Name: [name] MRN: [id number] DOB: 06-Mar-1956 Today's Date: 06/07/2016 Time: [id number] SLP Time Calculation (min) (ACUTE ONLY): 15 min  Problem List:  Patient Active Problem List   Diagnosis Date Noted  . ICH (intracerebral hemorrhage) ([place]) 06/06/2016  . Chest pain, musculoskeletal 05/16/2011  . Vaginal candidiasis 05/16/2011  . Hypertension 05/16/2011  . Diabetes mellitus, type 2 ([place]) 05/16/2011  . CHEST PAIN 10/01/2009  . CELLULITIS AND ABSCESS OF LEG EXCEPT FOOT 08/31/2009  . INSOMNIA 07/13/2008  . MRSA 04/17/2008  . NECK MASS 04/17/2008  . COCAINE ABUSE 11/01/2007  . OBESITY NOS 07/14/2006  . DENTAL CARIES 07/14/2006  . DIABETES MELLITUS, TYPE II 04/26/2006  . ALCOHOL ABUSE 04/26/2006  . TOBACCO ABUSE 04/26/2006  . DEPRESSION 04/26/2006  . HYPERTENSION 04/26/2006  . GERD 04/26/2006  . LOW BACK PAIN 04/26/2006   Past Medical History:  Past Medical History:  Diagnosis Date  . Diabetes mellitus    Hb A1C = 12.6 on 05/15/11, managed on Novolog 70/30, 35 U qam, 25 U qpm  . Hypertension    poorly controlled  . Shortness of breath    Past Surgical History:  Past Surgical History:  Procedure Laterality Date  . ANKLE FRACTURE SURGERY  2007   HPI:  54-yo woman with history of DM, poorly controlled,HTN, SOB, currently uses crack cocaine admitted with tremors/contractions of the L hand and face, trouble walking with weakness of the L hand. Pt had seizure while in CT scanner. CT showed a pontine hemorrhage. Per MD notes sh has been having trouble swallowing and gets choked by both solids and liquids. CXR No active lung disease. Degenerative change in both shoulders.   Assessment / Plan / Recommendation Clinical Impression  Pt speech is intelligible with aspects of imprecision and questionable resonance dysfunction which will be further assessed in diagnostic treatment. She stated her speech is "slower." Pt scored  within the lower part of average range for memory on 4 word recall subtest of Cognistat however suspect functional memory deficits (did not recall diagnosis). Mild difficulties with verbal problem solving and multistep commands. Pt would benefit from continued ST on acute care and inpatient rehab.        SLP Assessment  Patient needs continued Speech Lanaguage Pathology Services    Follow Up Recommendations  Inpatient Rehab    Frequency and Duration min 2x/week  2 weeks      SLP Evaluation Cognition  Overall Cognitive Status: Impaired/Different from baseline (per pt) Arousal/Alertness: Awake/alert Orientation Level: Oriented to person;Oriented to place;Oriented to time;Disoriented to situation Attention: Sustained Sustained Attention: Appears intact Memory: Appears intact (for 4 words) Awareness: Impaired Awareness Impairment: Anticipatory impairment;Emergent impairment Problem Solving: Impaired Problem Solving Impairment: Verbal basic Safety/Judgment:  (questionable)       Comprehension  Auditory Comprehension Overall Auditory Comprehension: Appears within functional limits for tasks assessed Interfering Components: Processing speed EffectiveTechniques: Extra processing time Visual Recognition/Discrimination Discrimination: Not tested Reading Comprehension Reading Status:  (TBA)    Expression Expression Primary Mode of Expression: Verbal Verbal Expression Overall Verbal Expression: Appears within functional limits for tasks assessed Initiation: No impairment Level of Generative/Spontaneous Verbalization: Conversation Repetition:  (NT) Naming: Not tested Pragmatics: No impairment Written Expression Dominant Hand: Right Written Expression:  (TBA)   Oral / Motor  Oral Motor/Sensory Function Overall Oral Motor/Sensory Function: Generalized oral weakness Lingual Symmetry:  (slight deviation to right) Velum:  (decreased elevation) Mandible: Within Functional  Limits Motor Speech Overall Motor Speech: Impaired Respiration: Impaired Level of  Impairment: Conversation Phonation: Low vocal intensity Resonance:  (dysfunction? will assess further) Articulation: Within functional limitis Intelligibility: Intelligible Motor Planning: Witnin functional limits   GO                    [name] 06/07/2016, 9:10 AM  [name].Ed [employer] [phone]

## 2016-06-07 NOTE — Progress Notes (Signed)
Rehab Admissions Coordinator Note:  Patient was screened by Retta Diones for appropriateness for an Inpatient Acute Rehab Consult.  Noted SLP recommending inpatient rehab consult.  Awaiting PT/OT evaluations and recommendations to determine functional needs.  Can then determine if we need a rehab consult.  Retta Diones 06/07/2016, 10:00 AM  I can be reached at [phone].

## 2016-06-07 NOTE — Progress Notes (Signed)
STROKE TEAM PROGRESS NOTE   HISTORY OF PRESENT ILLNESS (per record) This is a 60-yo RH woman who was brought to the [facility] ED by EMS. History is obtained from the patient who is a fair historian. Additional information is obtained from review of the patient's medical record.   Per notes, EMS was called to the patient's home because she was having tremors and contractions of the L hand and face. She reported to them that she was having trouble walking with weakness of the L hand since last night. Per ED MD note, EMS reported that the patient was alert and oriented and able to walk for them. ED PA note indicates that the caregiver reported progressive difficult walking with slurred speech for the past week. She developed weakness and tremors in the left hand 3-4 days ago. In the ED, she was initially alert and oriented and able to get off the gurney to go to the bathroom. However, after returning from the bathroom she had a sudden decrease in level of arousal, unable to follow commands and repeating her birthday anytime someone asked her a question. There was concern for ICH and she was sent for a STAT CT of the head. While in the scanner she reportedly had another seizure and was given Ativan. CTH showed a pontine hemorrhage. She has been hypertensive in the ED with SBP 151-202. She has been placed on a nicardipine drip and received a Keppra load of 1750 mg.  Her mentation has steadily improved after her seizure. On my assessment, she is alert and able to answer questions. She tells me that she has been having trouble speaking and using her left hand for 3-4 weeks. In addition, she has had trouble walking and reports a fall at home two days ago that occurred because she was unable to control her leg. She has a non-specific headache with significant nausea, no vomiting. She has been having trouble swallowing and gets choked by both solids and liquids. She has blurred vision but denies double vision or  frank vision loss. She states that symptoms got worse today and this prompted her to call [phone].   She denies any prior history of stroke or hemorrhage. She has no prior history of seizure. She admits to using crack cocaine every other day and last used yesterday.   SUBJECTIVE (INTERVAL HISTORY) Patient stated her symptoms started about 1 week ago when she had coughing and also slurred speech. Does not seem to have any further weakness or numbness currently. She has history of crack cocaine use as per her girlfriend who is present in the room.   OBJECTIVE Temp:  [98.1 F (36.7 C)-98.6 F (37 C)] 98.6 F (37 C) (11/29 1220) Pulse Rate:  [71-112] 88 (11/29 1300) Cardiac Rhythm: Normal sinus rhythm (11/29 0800) Resp:  [15-30] 19 (11/29 1300) BP: (83-236)/(68-118) 149/98 (11/29 1300) SpO2:  [85 %-97 %] 96 % (11/29 1300) Weight:  [200 lb (90.7 kg)-205 lb 14.6 oz (93.4 kg)] 205 lb 14.6 oz (93.4 kg) (11/29 0113)  CBC:  Recent Labs Lab 06/06/16 1508  WBC 10.8*  NEUTROABS 7.7  HGB 16.1*  HCT 46.7*  MCV 93.6  PLT 123456    Basic Metabolic Panel:  Recent Labs Lab 06/06/16 1508 06/07/16 1053  NA 136 146*  K 3.0* 2.8*  CL 101 114*  CO2 25 21*  GLUCOSE 650* 167*  BUN 11 8  CREATININE 1.74* 1.38*  CALCIUM 9.0 8.4*    Lipid Panel:    Component  Value Date/Time   CHOL 157 06/06/2016 1508   TRIG 214 (H) 06/06/2016 1508   HDL 33 (L) 06/06/2016 1508   CHOLHDL 4.8 06/06/2016 1508   VLDL 43 (H) 06/06/2016 1508   LDLCALC 81 06/06/2016 1508   HgbA1c:  Lab Results  Component Value Date   HGBA1C 12.6 (H) 05/15/2011   Urine Drug Screen:    Component Value Date/Time   LABOPIA NONE DETECTED 06/06/2016 1832   COCAINSCRNUR POSITIVE (A) 06/06/2016 1832   COCAINSCRNUR (A) 03/19/2008 2343    POSITIVE (NOTE) Sent for confirmatory testing Result repeated and verified.   LABBENZ NONE DETECTED 06/06/2016 1832   LABBENZ (A) 03/19/2008 2343    POSITIVE (NOTE) Sent for confirmatory testing  Result repeated and verified.   AMPHETMU NONE DETECTED 06/06/2016 1832   THCU NONE DETECTED 06/06/2016 1832   LABBARB NONE DETECTED 06/06/2016 1832      IMAGING  Ct Head Wo Contrast  Result Date: 06/06/2016 CLINICAL DATA:  Confusion with left arm weakness and aphasia EXAM: CT HEAD WITHOUT CONTRAST TECHNIQUE: Contiguous axial images were obtained from the base of the skull through the vertex without intravenous contrast. COMPARISON:  None. FINDINGS: Brain: There is mild diffuse atrophy. There is focal hemorrhage arising in the left pons which extends slightly to the right of midline in the lower pons region. There is mild surrounding edema in this area. This hemorrhage at its maximum measures 1.3 x 1.1 cm. No other hemorrhage is seen. There is no well-defined mass. There is no extra-axial fluid collection or midline shift. There is mild small vessel disease in the centra semiovale bilaterally. Vascular: There is no hyperdense vessel. There is calcification in each carotid siphon region. Skull: The bony calvarium appears intact. Sinuses/Orbits: There is opacification of a right ethmoid air cell. Visualized paranasal sinuses elsewhere clear. There is rightward deviation of the nasal septum. Orbits appear symmetric bilaterally. Other: Visualized mastoid air cells are clear. IMPRESSION: Acute hemorrhage arising from the left pons and extending across the midline to the medial right lower pons near in the inferior pontine region. Suspect hemorrhagic infarct, most likely secondary to hypertension. There is mild atrophy with mild periventricular small vessel disease. No other hemorrhage evident. There are foci of arterial vascular calcification in the carotid siphon regions. There is right ethmoid sinus disease. There is a deviated nasal septum. Critical Value/emergent results were called by telephone at the time of interpretation on 06/06/2016 at 3:58 pm to [facility], [facility], who verbally acknowledged these  results. Electronically Signed   By: Lowella Grip III M.D.   On: 06/06/2016 16:02   Dg Chest Portable 1 View  Result Date: 06/06/2016 CLINICAL DATA:  Left hand tremors, left-sided facial tremor, difficulty ambulating EXAM: PORTABLE CHEST 1 VIEW COMPARISON:  Chest x-ray of 05/16/2016 FINDINGS: No pneumonia or effusion is seen. Mediastinal and hilar contours are unremarkable. The heart is within upper limits of normal. No acute bony abnormality is seen. There are degenerative changes in the shoulders present. IMPRESSION: No active lung disease.  Degenerative change in both shoulders. Electronically Signed   By: Ivar Drape M.D.   On: 06/06/2016 17:15   PHYSICAL EXAM General: obese female, awake, talking. A&ox4.  HEENT: [place]/at.  CV: rrr, no m/r/g Neurological Exam :  : Awake and alert oriented 3 with normal speech and language function. Pupils equal reactive. Fundi were not visualized. full visual field. Mild right lower facial asymmetry when she smiles. Sensation intact b/l on face and exts. 5/5 str bilaterally upper  and lower ext. except mild weakness of right grip and intrinsic hand muscles. Orbits left over right upper extremity. Speech is normal. 2+ DTR. Normal finger to nose. Gait not tested ASSESSMENT/PLAN Ms. Ellanie Francisco is a 60 y.o. female with history of DM II, HTN, cocaine abuse, medication noncomlpiance presenting with slurred speech, R arm weakness and numbness. She did not receive IV t-PA due to hemorrhagic stroke.   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 CT scan findings are suggestive but not definitive and clinical presentation does not match CT scan findings  Resultant  Slurred speech, arm weakness, numbness  MRI  pending  MRA  pending  Carotid Doppler  Not ordered  2D Echo  Not ordered  LDL 81  HgbA1c 12.6  SCD or VTE prophylaxis  DIET DYS 2 Room service appropriate? Yes; Fluid consistency: Nectar Thick  No  antithrombotic prior to admission, now on No antithrombotic  Ongoing aggressive stroke risk factor management  Therapy recommendations:  pending  Disposition:  Pending  Patient had a hemorrhagic infarct of left pons likely few days ago possibly due to her uncontrolled HTN and cocaine abuse.   Will get MRI. If no new bleeding, may transfer out to tele floor.  Hypertension  Stable. Off cardene gtt.  Goal <180.  Hyperlipidemia  Home meds:  None  LDL 81, goal < 70  Consider adding Lipitor 40mg  daily  Uncontrolled Diabetes with Hyperglycemia >600 Hyperglycemia improved without requiring insulin gtt. Now on   HgbA1c 12.6 goal < 7.0  Has medication noncompliance with her home meds. Will add Lantus 7 units nightly and continue SSI.  Other Stroke Risk Factors  Advanced age  Cigarette and cocaine abuse - advised cessation.   ETOH use  Obesity, Body mass index is 36.48 kg/m., recommend weight loss, diet and exercise as appropriate   DM II and HTN  Other Active Problems  Hypokalemia - repleting.   Hospital day # 1  Dellia Nims, M.D. PGY-3 I have personally examined this patient, reviewed notes, independently viewed imaging studies, participated in medical decision making and plan of care.ROS completed by me personally and pertinent positives fully documented  I have made any additions or clarifications directly to the above note. Agree with note above. Agree with strict blood pressure control and close neurological monitoring. Check MRI scan of the brain today. Patient counseled to make lifestyle changes and quit cocaine.This patient is critically ill and at significant risk of neurological worsening, death and care requires constant monitoring of vital signs, hemodynamics,respiratory and cardiac monitoring, extensive review of multiple databases, frequent neurological assessment, discussion with family, other specialists and medical decision making of high complexity.I have  made any additions or clarifications directly to the above note.This critical care time does not reflect procedure time, or teaching time or supervisory time of PA/NP/Med Resident etc but could involve care discussion time.  I spent 40 minutes of neurocritical care time  in the care of  this patient.    Antony Contras, MD Medical Director [facility] Pager: [phone] 06/07/2016 3:16 PM  To contact Stroke Continuity provider, please refer to [email or web address] After hours, contact General Neurology

## 2016-06-07 NOTE — Progress Notes (Signed)
Dr. Nicole Kindred notified of CBG of 401. Order received to give the sliding scale of 20 units. Will monitor.

## 2016-06-07 NOTE — Evaluation (Signed)
Physical Therapy Evaluation Patient Details Name: [name] MRN: [id number] DOB: 02/28/1956 Today's Date: 06/07/2016   History of Present Illness  (P) 60 yo female with hx of HTN, DM presents from home with tremors and contractions of the L hand and face, difficulty walking and slurred speech. 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 Tolerated standing and SPT to chair with Min-Mod A for balance/safety. Pt with instability in BLEs most notable with functional activities. Focused on wanting to get something cold to drink. Pt independent and caring for b/f PTA. Would benefit from CIR to maximize independence and mobility prior to return home. Will follow.     Follow Up Recommendations CIR;Supervision for mobility/OOB    Equipment Recommendations  Other (comment) (TBA)    Recommendations for Other Services       Precautions / Restrictions Precautions Precautions: (P) Fall Precaution Comments: (P) watch BP  Restrictions Weight Bearing Restrictions: No      Mobility  Bed Mobility Overal bed mobility: (P) Needs Assistance Bed Mobility: (P) Supine to Sit     Supine to sit: (P) Mod assist     General bed mobility comments: (P) (A) to elevate trunk from bed surface. bil LE (A) to eob.   Transfers Overall transfer level: Needs assistance Equipment used: Rolling walker (2 wheeled) Transfers: Sit to/from [facility] Sit to Stand: Mod assist Stand pivot transfers: Min assist       General transfer comment: Assist to power to standing with pt pulling up on RW. Trembling in BLEs upon standing. SPT to chair with Min A for balance due to bil knee instability.   Ambulation/Gait                Stairs            Wheelchair Mobility    Modified Rankin  (Stroke Patients Only) Modified Rankin (Stroke Patients Only) Pre-Morbid Rankin Score: No symptoms Modified Rankin: Moderately severe disability     Balance Overall balance assessment: Needs assistance Sitting-balance support: Feet supported;No upper extremity supported Sitting balance-Leahy Scale: Good     Standing balance support: During functional activity Standing balance-Leahy Scale: Poor Standing balance comment: Reliant on BUEs for support in standing.                              Pertinent Vitals/Pain Pain Assessment: 0-10 Pain Score: 3  Faces Pain Scale: (P) Hurts a little bit Pain Location: head Pain Descriptors / Indicators: Headache Pain Intervention(s): Monitored during session    Home Living Family/patient expects to be discharged to:: (P) Private residence Living Arrangements: (P) Spouse/significant other Available Help at Discharge: Family Type of Home: (P) House Home Access: (P) Elevator     Home Layout: (P) One level Home Equipment: (P) None (all DME is for boyfriend )      Prior Function Level of Independence: (P) Independent         Comments: (P) helping take care of boyfriend that she has to dress and bath. [name] ( boyfriend) RN comes Monday - Friday to help his caregiver     Hand Dominance   Dominant Hand: (P) Right    Extremity/Trunk Assessment   Upper Extremity Assessment: Defer to OT evaluation  Lower Extremity Assessment: RLE deficits/detail;LLE deficits/detail RLE Deficits / Details: Grossly ~3/5 throughout except 2/5 hip flexion.  Reports pins/needles sensation in bil feet- premorbid     Communication   Communication: (P) Other (comment) (raspy voice quality)  Cognition Arousal/Alertness: (P) Awake/alert Behavior During Therapy: (P) WFL for tasks assessed/performed Overall Cognitive Status:  (Seems WFL but per pt (from SLP) pt is impaired. A&Ox4)                      General Comments  General comments (skin integrity, edema, etc.): BP 142/100 sitting EOB.    Exercises     Assessment/Plan    PT Assessment Patient needs continued PT services  PT Problem List Decreased strength;Decreased mobility;Decreased range of motion;Decreased activity tolerance;Pain;Impaired sensation;Decreased balance;Decreased cognition          PT Treatment Interventions DME instruction;Therapeutic activities;Gait training;Therapeutic exercise;Patient/family education;Balance training;Functional mobility training;Neuromuscular re-education;Cognitive remediation    PT Goals (Current goals can be found in the Care Plan section)  Acute Rehab PT Goals Patient Stated Goal: "to get back to normal" PT Goal Formulation: With patient Time For Goal Achievement: 06/21/16 Potential to Achieve Goals: Good    Frequency Min 4X/week   Barriers to discharge Decreased caregiver support usually cares for b/f at home    Co-evaluation PT/OT/SLP Co-Evaluation/Treatment: Yes Reason for Co-Treatment: For patient/therapist safety           End of Session Equipment Utilized During Treatment: Gait belt Activity Tolerance: Patient tolerated treatment well Patient left: in chair;with chair alarm set;with call bell/phone within reach (chair alarm pad under pt) Nurse Communication: Mobility status         Time: [id number] PT Time Calculation (min) (ACUTE ONLY): 22 min   Charges:   PT Evaluation $PT Eval Moderate Complexity: 1 Procedure     PT G Codes:        Rael Yo [name] 06/07/2016, 2:07 PM [name], [place], [place]

## 2016-06-07 NOTE — Progress Notes (Signed)
Inpatient Diabetes Program Recommendations  AACE/ADA: New Consensus Statement on Inpatient Glycemic Control (2015)  Target Ranges:  Prepandial:   less than 140 mg/dL      Peak postprandial:   less than 180 mg/dL (1-2 hours)      Critically ill patients:  140 - 180 mg/dL   Lab Results  Component Value Date   GLUCAP 267 (H) 06/07/2016   HGBA1C 12.6 (H) 05/15/2011   Review of Glycemic Control  Diabetes history: DM 2 Outpatient Diabetes medications: 70/30 40 units BID (basal equivalent 56 units, short acting component 24 units) Current orders for Inpatient glycemic control: Novolog Resistant Q4 hours  Inpatient Diabetes Program Recommendations:   Patient has long acting insulin component in the 70/30 insulin used at home. Please consider Lantus 14 units (0.15 units/kg) Q 24hours.   Thanks,  Tama Headings RN, MSN, [facility] Inpatient Diabetes Coordinator Team Pager [phone] (8a-5p)

## 2016-06-08 ENCOUNTER — Encounter (HOSPITAL_COMMUNITY): Payer: Self-pay | Admitting: *Deleted

## 2016-06-08 DIAGNOSIS — G936 Cerebral edema: Secondary | ICD-10-CM

## 2016-06-08 DIAGNOSIS — I610 Nontraumatic intracerebral hemorrhage in hemisphere, subcortical: Secondary | ICD-10-CM

## 2016-06-08 LAB — BASIC METABOLIC PANEL
ANION GAP: 11 (ref 5–15)
BUN: 10 mg/dL (ref 6–20)
CALCIUM: 8.2 mg/dL — AB (ref 8.9–10.3)
CO2: 19 mmol/L — ABNORMAL LOW (ref 22–32)
CREATININE: 1.37 mg/dL — AB (ref 0.44–1.00)
Chloride: 113 mmol/L — ABNORMAL HIGH (ref 101–111)
GFR calc Af Amer: 48 mL/min — ABNORMAL LOW (ref >60)
GFR, EST NON AFRICAN AMERICAN: 41 mL/min — AB (ref >60)
GLUCOSE: 277 mg/dL — AB (ref 65–99)
Potassium: 2.9 mmol/L — ABNORMAL LOW (ref 3.5–5.1)
Sodium: 143 mmol/L (ref 135–145)

## 2016-06-08 LAB — GLUCOSE, CAPILLARY
GLUCOSE-CAPILLARY: 314 mg/dL — AB (ref 65–99)
Glucose-Capillary: 313 mg/dL — ABNORMAL HIGH (ref 65–99)
Glucose-Capillary: 151 mg/dL — ABNORMAL HIGH (ref 65–99)
Glucose-Capillary: 279 mg/dL — ABNORMAL HIGH (ref 65–99)
Glucose-Capillary: 60 mg/dL — ABNORMAL LOW (ref 65–99)
Glucose-Capillary: 151 mg/dL — ABNORMAL HIGH (ref 65–99)

## 2016-06-08 LAB — HEMOGLOBIN A1C
HEMOGLOBIN A1C: 11.4 % — AB (ref 4.8–5.6)
Mean Plasma Glucose: 280 mg/dL

## 2016-06-08 MED ORDER — INSULIN ASPART PROT & ASPART (70-30 MIX) 100 UNIT/ML ~~LOC~~ SUSP
40.0000 [IU] | Freq: Two times a day (BID) | SUBCUTANEOUS | Status: DC
  Filled 2016-06-08: qty 10

## 2016-06-08 MED ORDER — TRAMADOL HCL 50 MG PO TABS: 50.0000 mg | ORAL_TABLET | Freq: Every day | ORAL | Status: DC | PRN

## 2016-06-08 MED ORDER — ACYCLOVIR 400 MG PO TABS
400.0000 mg | ORAL_TABLET | Freq: Four times a day (QID) | ORAL | Status: DC
  Administered 2016-06-08 – 2016-06-09 (×5): 400 mg via ORAL
  Filled 2016-06-08 (×8): qty 1

## 2016-06-08 MED ORDER — INSULIN ASPART PROT & ASPART (70-30 MIX) 100 UNIT/ML PEN
40.0000 [IU] | PEN_INJECTOR | Freq: Two times a day (BID) | SUBCUTANEOUS | Status: DC
  Filled 2016-06-08: qty 3

## 2016-06-08 MED ORDER — INSULIN ASPART PROT & ASPART (70-30 MIX) 100 UNIT/ML ~~LOC~~ SUSP
40.0000 [IU] | Freq: Two times a day (BID) | SUBCUTANEOUS | Status: DC
  Administered 2016-06-08 – 2016-06-09 (×3): 40 [IU] via SUBCUTANEOUS

## 2016-06-08 MED ORDER — FUROSEMIDE 20 MG PO TABS: 20.0000 mg | ORAL_TABLET | Freq: Every day | ORAL | Status: DC | PRN

## 2016-06-08 MED ORDER — INSULIN ASPART 100 UNIT/ML ~~LOC~~ SOLN
0.0000 [IU] | Freq: Three times a day (TID) | SUBCUTANEOUS | Status: DC
  Administered 2016-06-08: 11 [IU] via SUBCUTANEOUS
  Administered 2016-06-08 – 2016-06-09 (×2): 4 [IU] via SUBCUTANEOUS
  Administered 2016-06-09: 11 [IU] via SUBCUTANEOUS

## 2016-06-08 MED ORDER — TRAZODONE HCL 100 MG PO TABS
100.0000 mg | ORAL_TABLET | Freq: Every day | ORAL | Status: DC
  Administered 2016-06-08: 100 mg via ORAL
  Filled 2016-06-08: qty 1

## 2016-06-08 MED ORDER — LISINOPRIL 20 MG PO TABS: 20.0000 mg | ORAL_TABLET | Freq: Every day | ORAL | Status: DC

## 2016-06-08 MED ORDER — LISINOPRIL-HYDROCHLOROTHIAZIDE 20-12.5 MG PO TABS: 1.0000 | ORAL_TABLET | Freq: Every day | ORAL | Status: DC

## 2016-06-08 MED ORDER — AMLODIPINE BESYLATE 10 MG PO TABS
10.0000 mg | ORAL_TABLET | Freq: Every day | ORAL | Status: DC
  Administered 2016-06-08 – 2016-06-09 (×2): 10 mg via ORAL
  Filled 2016-06-08 (×2): qty 1

## 2016-06-08 MED ORDER — LABETALOL HCL 5 MG/ML IV SOLN
10.0000 mg | INTRAVENOUS | Status: DC | PRN
  Administered 2016-06-08 – 2016-06-09 (×2): 10 mg via INTRAVENOUS
  Filled 2016-06-08 (×2): qty 4

## 2016-06-08 MED ORDER — GABAPENTIN 300 MG PO CAPS
600.0000 mg | ORAL_CAPSULE | Freq: Four times a day (QID) | ORAL | Status: DC
  Administered 2016-06-08 – 2016-06-09 (×4): 600 mg via ORAL
  Filled 2016-06-08 (×5): qty 2

## 2016-06-08 MED ORDER — SIMVASTATIN 5 MG PO TABS
10.0000 mg | ORAL_TABLET | Freq: Every day | ORAL | Status: DC
  Administered 2016-06-08: 10 mg via ORAL
  Filled 2016-06-08: qty 2

## 2016-06-08 MED ORDER — HYDROCHLOROTHIAZIDE 12.5 MG PO CAPS
12.5000 mg | ORAL_CAPSULE | Freq: Every day | ORAL | Status: DC
  Administered 2016-06-08 – 2016-06-09 (×2): 12.5 mg via ORAL
  Filled 2016-06-08 (×2): qty 1

## 2016-06-08 MED ORDER — INSULIN ASPART PROT & ASPART (70-30 MIX) 100 UNIT/ML PEN: 40.0000 [IU] | PEN_INJECTOR | Freq: Two times a day (BID) | SUBCUTANEOUS | Status: DC

## 2016-06-08 MED ORDER — LISINOPRIL 20 MG PO TABS
20.0000 mg | ORAL_TABLET | Freq: Every day | ORAL | Status: DC
  Administered 2016-06-08 – 2016-06-09 (×2): 20 mg via ORAL
  Filled 2016-06-08 (×3): qty 1

## 2016-06-08 MED ORDER — CYCLOBENZAPRINE HCL 10 MG PO TABS: 10.0000 mg | ORAL_TABLET | Freq: Two times a day (BID) | ORAL | Status: DC | PRN

## 2016-06-08 NOTE — Consult Note (Signed)
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 Leonie Man   HPI: [name] is a 60 y.o. right handed female with history of poorly controlled diabetes mellitus, hypertension, tobacco cocaine abuse. Per chart review patient lives with significant other and independent prior to admission. 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 Presented 06/06/2016 with tremors and reported weakness of the left hand and facial droop as well as slurred speech over the past 3-4 days with increasing lethargy as well as a recent fall 2 days prior to admission. SBP 151-202. Urine drug screen positive for cocaine. Troponin negative. 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 While in the CT scan reported seizure and was given Ativan. EEG showed no seizure activity. Placed on nicardipine  drip for blood pressure control. Loaded with Keppra for seizure prophylaxis. Follow-up MRI/MRA showed stable hemorrhage, no related findings on intracranial MRA. Hemoglobin A1c 11.4 with insulin therapy as directed. Currently on a dysphagia #2 nectar thick liquid diet. Physical and occupational therapy evaluation completed 06/07/2016 with recommendations of physical medicine rehabilitation consult.   Review of Systems  Constitutional: Negative for chills and fever.  HENT: Negative for hearing loss and tinnitus.   Eyes: Negative for blurred vision and double vision.  Respiratory: Positive for cough. Negative for shortness of breath.   Cardiovascular: Negative for chest pain, palpitations and leg swelling.  Gastrointestinal: Positive for constipation, heartburn and nausea. Negative for vomiting.  Genitourinary: Negative for dysuria, flank pain and hematuria.    Musculoskeletal: Positive for back pain, falls and myalgias.  Skin: Negative for rash.  Neurological: Positive for dizziness, tremors, speech change, weakness and headaches. Negative for loss of consciousness.  Psychiatric/Behavioral: The patient has insomnia.   All other systems reviewed and are negative.  Past Medical History:  Diagnosis Date  . Diabetes mellitus    Hb A1C = 12.6 on 05/15/11, managed on Novolog 70/30, 35 U qam, 25 U qpm  . Hypertension    poorly controlled  . Shortness of breath    Past Surgical History:  Procedure Laterality Date  . ANKLE FRACTURE SURGERY  2007   No family history on file. Social History:  reports that she has been smoking.  She has a 12.50 pack-year smoking history. She has never used smokeless tobacco. She reports that she drinks about 7.2 oz of alcohol per week . She reports that she uses drugs, including Cocaine. Allergies: No Known Allergies Medications Prior to Admission  Medication Sig Dispense Refill  . acyclovir (ZOVIRAX) 400 MG tablet Take 1 tablet (400 mg total) by mouth 4 (four) times daily. 50 tablet 0  . amLODipine (NORVASC) 10 MG tablet Take 10 mg by mouth daily.    . cyclobenzaprine (FLEXERIL) 10 MG tablet Take 1 tablet (10 mg total) by mouth 2 (two) times daily as needed for muscle spasms. 10 tablet 0  . furosemide (LASIX) 20 MG tablet Take 20 mg by mouth daily as needed (for swollen legs).   0  . gabapentin (NEURONTIN) 300 MG capsule Take 600 mg by mouth 4 (four) times daily.    Marland Kitchen HYDROcodone-acetaminophen (NORCO/VICODIN) 5-325 MG per tablet Take 2 tablets by mouth every 6 (six) hours as needed for pain. 15 tablet 0  . ibuprofen (ADVIL,MOTRIN) 400 MG tablet  Take 2 tablets (800 mg total) by mouth every 6 (six) hours as needed. For pain (Patient taking differently: Take 800 mg by mouth every 6 (six) hours as needed (for pain). ) 30 tablet 0  . lisinopril (PRINIVIL,ZESTRIL) 10 MG tablet Take 2 tablets (20 mg total) by mouth daily.  (Patient not taking: Reported on 06/06/2016) 60 tablet 0  . lisinopril (PRINIVIL,ZESTRIL) 20 MG tablet Take 1 tablet (20 mg total) by mouth daily. (Patient not taking: Reported on 06/06/2016) 30 tablet 1  . lisinopril-hydrochlorothiazide (PRINZIDE,ZESTORETIC) 20-12.5 MG tablet Take 1 tablet by mouth daily.  0  . NOVOLOG MIX 70/30 FLEXPEN (70-30) 100 UNIT/ML FlexPen Inject 40 Units into the skin 2 (two) times daily. MORNING AND BEDTIME  0  . simvastatin (ZOCOR) 10 MG tablet Take 10 mg by mouth at bedtime.  0  . traMADol (ULTRAM) 50 MG tablet Take 50 mg by mouth daily as needed (for pain).    . traZODone (DESYREL) 100 MG tablet Take 100 mg by mouth at bedtime.  0    Home: Home Living Family/patient expects to be discharged to:: Private residence Living Arrangements: Spouse/significant other Available Help at Discharge: Family Type of Home: House Home Access: [place]: One level Bathroom Shower/Tub: Multimedia programmer: [place]: None (all DME is for boyfriend )  Lives With: Significant other  Functional History: Prior Function Level of Independence: Independent Comments: helping take care of boyfriend that she has to dress and bath. [name] ( boyfriend) RN comes Monday - Friday to help his caregiver Functional Status:  Mobility: Bed Mobility Overal bed mobility: Needs Assistance Bed Mobility: Supine to Sit Supine to sit: Mod assist General bed mobility comments: (A) to elevate trunk from bed surface. bil LE (A) to eob.  Transfers Overall transfer level: Needs assistance Equipment used: Rolling walker (2 wheeled) Transfers: Sit to/from Stand Sit to Stand: Mod assist Stand pivot transfers: Min assist General transfer comment: Assist to power to standing with pt pulling up on RW. Trembling in BLEs upon standing. SPT to chair with Min A for balance due to bil knee instability.       ADL: ADL Overall ADL's : Needs  assistance/impaired Eating/Feeding: Supervision/ safety Eating/Feeding Details (indicate cue type and reason): repeatly asking for juice or ice Grooming: Wash/dry face, Wash/dry hands, Set up Toilet Transfer: Moderate assistance Toileting- Clothing Manipulation and Hygiene: Minimal assistance Toileting - Clothing Manipulation Details (indicate cue type and reason): (A) for steady. standing without (A) requires bil Ue to power off 3n1 General ADL Comments: pt declined 3n1 with therapy but requesting it with RN arrival at end of session. Ot (A)ing with transfer  Cognition: Cognition Overall Cognitive Status: Impaired/Different from baseline Arousal/Alertness: Awake/alert Orientation Level: Oriented X4 Attention: Sustained Sustained Attention: Appears intact Memory: Appears intact (for 4 words) Awareness: Impaired Awareness Impairment: Anticipatory impairment, Emergent impairment Problem Solving: Impaired Problem Solving Impairment: Verbal basic Safety/Judgment:  (questionable) Cognition Arousal/Alertness: Awake/alert Behavior During Therapy: WFL for tasks assessed/performed Overall Cognitive Status: Impaired/Different from baseline Area of Impairment: Awareness, Safety/judgement Memory: Decreased short-term memory Safety/Judgement: Decreased awareness of safety, Decreased awareness of deficits Awareness: Emergent General Comments: Pt requires cues for safety. pt repeating same questions in session even after being provided an answer  Blood pressure (!) 152/89, pulse 100, temperature 98.1 F (36.7 C), temperature source Oral, resp. rate (!) 27, height 5\' 3"  (1.6 m), weight 93.4 kg (205 lb 14.6 oz), SpO2 98 %. Physical Exam  Vitals reviewed. Constitutional: She appears well-developed.  HENT:  Head: Normocephalic.  Eyes: EOM are normal.  Neck: Normal range of motion. Neck supple. No thyromegaly present.  Cardiovascular: Normal rate and regular rhythm.   Respiratory: Effort normal  and breath sounds normal. No respiratory distress.  GI: Soft. Bowel sounds are normal. She exhibits no distension.  Neurological: She is alert.  Alert and makes good eye contact with examiner. Speech is dysarthric but intelligible. Right central 7. She is able to provide her name, age and date of birth. Follows simple commands.RUE 3-4/5 prox to distal. RLE 4/5 prox to distal. Good sitting balance in chair  Skin: Skin is warm and dry.  Psychiatric: She has a normal mood and affect. Her behavior is normal. Thought content normal.    Results for orders placed or performed during the hospital encounter of 06/06/16 (from the past 24 hour(s))  Glucose, capillary     Status: Abnormal   Collection Time: 06/07/16  8:36 AM  Result Value Ref Range   Glucose-Capillary 123 (H) 65 - 99 mg/dL  Basic metabolic panel     Status: Abnormal   Collection Time: 06/07/16 10:53 AM  Result Value Ref Range   Sodium 146 (H) 135 - 145 mmol/L   Potassium 2.8 (L) 3.5 - 5.1 mmol/L   Chloride 114 (H) 101 - 111 mmol/L   CO2 21 (L) 22 - 32 mmol/L   Glucose, Bld 167 (H) 65 - 99 mg/dL   BUN 8 6 - 20 mg/dL   Creatinine, Ser 1.38 (H) 0.44 - 1.00 mg/dL   Calcium 8.4 (L) 8.9 - 10.3 mg/dL   GFR calc non Af Amer 41 (L) >60 mL/min   GFR calc Af Amer 47 (L) >60 mL/min   Anion gap 11 5 - 15  Glucose, capillary     Status: Abnormal   Collection Time: 06/07/16 12:25 PM  Result Value Ref Range   Glucose-Capillary 267 (H) 65 - 99 mg/dL  Magnesium     Status: Abnormal   Collection Time: 06/07/16  1:52 PM  Result Value Ref Range   Magnesium 1.5 (L) 1.7 - 2.4 mg/dL  Glucose, capillary     Status: Abnormal   Collection Time: 06/07/16  3:43 PM  Result Value Ref Range   Glucose-Capillary 241 (H) 65 - 99 mg/dL  Glucose, capillary     Status: Abnormal   Collection Time: 06/07/16  7:59 PM  Result Value Ref Range   Glucose-Capillary 401 (H) 65 - 99 mg/dL  Glucose, capillary     Status: None   Collection Time: 06/07/16 11:47 PM   Result Value Ref Range   Glucose-Capillary 70 65 - 99 mg/dL  Basic metabolic panel     Status: Abnormal   Collection Time: 06/08/16  2:41 AM  Result Value Ref Range   Sodium 143 135 - 145 mmol/L   Potassium 2.9 (L) 3.5 - 5.1 mmol/L   Chloride 113 (H) 101 - 111 mmol/L   CO2 19 (L) 22 - 32 mmol/L   Glucose, Bld 277 (H) 65 - 99 mg/dL   BUN 10 6 - 20 mg/dL   Creatinine, Ser 1.37 (H) 0.44 - 1.00 mg/dL   Calcium 8.2 (L) 8.9 - 10.3 mg/dL   GFR calc non Af Amer 41 (L) >60 mL/min   GFR calc Af Amer 48 (L) >60 mL/min   Anion gap 11 5 - 15  Glucose, capillary     Status: Abnormal   Collection Time: 06/08/16  3:42 AM  Result Value Ref Range  Glucose-Capillary 313 (H) 65 - 99 mg/dL   Ct Head Wo Contrast  Result Date: 06/06/2016 CLINICAL DATA:  Confusion with left arm weakness and aphasia EXAM: CT HEAD WITHOUT CONTRAST TECHNIQUE: Contiguous axial images were obtained from the base of the skull through the vertex without intravenous contrast. COMPARISON:  None. FINDINGS: Brain: There is mild diffuse atrophy. There is focal hemorrhage arising in the left pons which extends slightly to the right of midline in the lower pons region. There is mild surrounding edema in this area. This hemorrhage at its maximum measures 1.3 x 1.1 cm. No other hemorrhage is seen. There is no well-defined mass. There is no extra-axial fluid collection or midline shift. There is mild small vessel disease in the centra semiovale bilaterally. Vascular: There is no hyperdense vessel. There is calcification in each carotid siphon region. Skull: The bony calvarium appears intact. Sinuses/Orbits: There is opacification of a right ethmoid air cell. Visualized paranasal sinuses elsewhere clear. There is rightward deviation of the nasal septum. Orbits appear symmetric bilaterally. Other: Visualized mastoid air cells are clear. IMPRESSION: Acute hemorrhage arising from the left pons and extending across the midline to the medial right  lower pons near in the inferior pontine region. Suspect hemorrhagic infarct, most likely secondary to hypertension. There is mild atrophy with mild periventricular small vessel disease. No other hemorrhage evident. There are foci of arterial vascular calcification in the carotid siphon regions. There is right ethmoid sinus disease. There is a deviated nasal septum. Critical Value/emergent results were called by telephone at the time of interpretation on 06/06/2016 at 3:58 pm to [facility], who verbally acknowledged these results. Electronically Signed   By: Lowella Grip III M.D.   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 Initial encounter. EXAM: MRI HEAD WITHOUT CONTRAST MRA HEAD WITHOUT CONTRAST TECHNIQUE: Multiplanar, multiecho pulse sequences of the brain and surrounding structures were obtained without intravenous contrast. Angiographic images of the head were obtained using MRA technique without contrast. COMPARISON:  Head CT without contrast 06/06/2016. FINDINGS: MRI HEAD FINDINGS Brain: Intra-axial hemorrhage centered in the left pons with intrinsic T1 hyperintensity and mixed T2 signal (subacute, methemoglobin) encompassing 9 x 14 x 19 mm (estimated blood volume 1 mL). Mild surrounding edema. Minimal mass effect. No extension to the adjacent brainstem or cerebellum. Associated susceptibility artifact on diffusion weighted imaging, with no larger area of brainstem restricted diffusion. There is a small nearby chronic lacunar infarct in the right pons (series 10, image 10). There is a small chronic lacunar infarct in the left thalamus (series 10, image 14). No other parenchymal blood products are identified. No cortical encephalomalacia, and otherwise gray and white matter signal is normal. No restricted diffusion elsewhere. No midline  shift, mass effect, evidence of mass lesion, ventriculomegaly, or extra-axial collection. Cervicomedullary junction and pituitary are within normal limits. Vascular: Major intracranial vascular flow voids are preserved, with mild intracranial artery tortuosity. Skull and upper cervical spine: Disc bulging and endplate degeneration including bulky anterior endplate osteophytes at C3-C4. Visualized bone marrow signal is within normal limits. Sinuses/Orbits: Negative orbits soft tissues. Mild right ethmoid and left sphenoid sinus mucosal thickening. Other: Visible internal auditory structures appear normal. Mastoids are clear. Negative scalp soft tissues. 2 cm soft tissue nodule in the inferior left parotid space (series 11, image 22). This appears fairly isointense to the gland on  coronal T2 imaging (series 13, image 13). The lesion is conspicuous on diffusion. Normal left stylomastoid foramen. MRA HEAD FINDINGS Antegrade flow in the posterior circulation with dominant distal right vertebral artery. Distal vertebral tortuosity without stenosis. Normal left PICA origin. Mildly tortuous but otherwise normal basilar artery without irregularity or stenosis. Normal SCA and PCA origins. The left posterior communicating artery is present, the right is diminutive or absent. Normal bilateral PCA branches. No abnormal vessels demonstrated about the brainstem hemorrhage. Antegrade flow in both ICA siphons. Bilateral cavernous segment tortuosity and irregularity, greater on the left. No hemodynamically significant siphon stenosis. 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 The left ophthalmic artery origin is normal. The left posterior communicating artery origin is normal. Normal carotid termini. Mild irregularity at the left MCA origin without significant stenosis. Normal ACA and right MCA origins. Tortuous A1 segments. Anterior communicating  artery and visualized ACA branches are within normal limits MCA M1 segments are moderately tortuous. MCA bifurcations and visualized MCA branches are within normal limits. IMPRESSION: 1. 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 Blood volume estimated at 1 mL. Mild surrounding edema without significant mass effect. Burtis Junes this is small vessel disease related with a nearby chronic right pontine lacunar infarct, and a chronic left thalamic lacunar infarct. 2. 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 MRA also reveals mild generalized intracranial artery tortuosity, and left greater than right ICA siphon atherosclerosis. 3. Two cm round soft tissue mass in the inferior left parotid space, with T2 signal nearly isointense to the gland suggesting a well differentiated Parotid Neoplasm. Recommend ENT follow-up. Electronically Signed   By: Genevie Ann M.D.   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 Initial encounter. EXAM: MRI HEAD WITHOUT CONTRAST MRA HEAD WITHOUT CONTRAST TECHNIQUE: Multiplanar, multiecho pulse sequences of the brain and surrounding structures were obtained without intravenous contrast. Angiographic images of the head were obtained using MRA technique without contrast. COMPARISON:  Head CT without contrast 06/06/2016. FINDINGS: MRI HEAD FINDINGS Brain: Intra-axial hemorrhage centered in the left pons with intrinsic T1 hyperintensity and mixed T2 signal (subacute, methemoglobin) encompassing 9 x 14 x 19 mm (estimated blood volume 1 mL). Mild surrounding edema. Minimal mass effect. No extension to the adjacent brainstem or cerebellum. Associated susceptibility artifact on diffusion weighted imaging, with no larger area of  brainstem restricted diffusion. There is a small nearby chronic lacunar infarct in the right pons (series 10, image 10). There is a small chronic lacunar infarct in the left thalamus (series 10, image 14). No other parenchymal blood products are identified. No cortical encephalomalacia, and otherwise gray and white matter signal is normal. No restricted diffusion elsewhere. No midline shift, mass effect, evidence of mass lesion, ventriculomegaly, or extra-axial collection. Cervicomedullary junction and pituitary are within normal limits. Vascular: Major intracranial vascular flow voids are preserved, with mild intracranial artery tortuosity. Skull and upper cervical spine: Disc bulging and endplate degeneration including bulky anterior endplate osteophytes at C3-C4. Visualized bone marrow signal is within normal limits. Sinuses/Orbits: Negative orbits soft tissues. Mild right ethmoid and left sphenoid sinus mucosal thickening. Other: Visible internal auditory structures appear normal. Mastoids are clear. Negative scalp soft tissues. 2 cm  soft tissue nodule in the inferior left parotid space (series 11, image 22). This appears fairly isointense to the gland on coronal T2 imaging (series 13, image 13). The lesion is conspicuous on diffusion. Normal left stylomastoid foramen. MRA HEAD FINDINGS Antegrade flow in the posterior circulation with dominant distal right vertebral artery. Distal vertebral tortuosity without stenosis. Normal left PICA origin. Mildly tortuous but otherwise normal basilar artery without irregularity or stenosis. Normal SCA and PCA origins. The left posterior communicating artery is present, the right is diminutive or absent. Normal bilateral PCA branches. No abnormal vessels demonstrated about the brainstem hemorrhage. Antegrade flow in both ICA siphons. Bilateral cavernous segment tortuosity and irregularity, greater on the left. No hemodynamically significant siphon stenosis. 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 The left ophthalmic artery origin is normal. The left posterior communicating artery origin is normal. Normal carotid termini. Mild irregularity at the left MCA origin without significant stenosis. Normal ACA and right MCA origins. Tortuous A1 segments. Anterior communicating artery and visualized ACA branches are within normal limits MCA M1 segments are moderately tortuous. MCA bifurcations and visualized MCA branches are within normal limits. IMPRESSION: 1. 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 Blood volume estimated at 1 mL. Mild surrounding edema without significant mass effect. Burtis Junes this is small vessel disease related with a nearby chronic right pontine lacunar infarct, and a chronic left thalamic lacunar infarct. 2. 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 MRA also reveals mild generalized intracranial artery tortuosity, and left greater than right ICA siphon atherosclerosis. 3. Two cm round soft tissue mass in the inferior left parotid space, with T2 signal nearly isointense to the gland suggesting a well differentiated Parotid Neoplasm. Recommend ENT follow-up. Electronically Signed   By: Genevie Ann M.D.   On: 06/07/2016 17:25   Dg Chest Portable 1 View  Result Date: 06/06/2016 CLINICAL DATA:  Left hand tremors, left-sided facial tremor, difficulty ambulating EXAM: PORTABLE CHEST 1 VIEW COMPARISON:  Chest x-ray of 05/16/2016 FINDINGS: No pneumonia or effusion is seen. Mediastinal and hilar contours are unremarkable. The heart is within upper limits of normal. No acute bony abnormality is seen. There are degenerative changes in the shoulders present. IMPRESSION: No active lung disease.  Degenerative change in both shoulders. Electronically Signed   By: Ivar Drape  M.D.   On: 06/06/2016 17:15   Dg Swallowing Func-speech Pathology  Result Date: 06/07/2016 Objective Swallowing Evaluation: Type of Study: MBS-Modified Barium Swallow Study Patient Details Name: Samijo Govern MRN: [id number] Date of Birth: Nov 01, 1955 Today's Date: 06/07/2016 Time: SLP Start Time (ACUTE ONLY): 1143-SLP Stop Time (ACUTE ONLY): 1203 SLP Time Calculation (min) (ACUTE ONLY): 20 min Past Medical History: Past Medical History: Diagnosis Date . Diabetes mellitus   Hb A1C = 12.6 on 05/15/11, managed on Novolog 70/30, 35 U qam, 25 U qpm . Hypertension   poorly controlled . Shortness of breath  Past Surgical History: Past Surgical History: Procedure Laterality Date . ANKLE FRACTURE SURGERY  2007 HPI: 30-yo woman with history of DM, poorly controlled,HTN, SOB, currently uses crack cocaine admitted with tremors/contractions of the L hand and face, trouble walking with weakness of the L hand. Pt had seizure while in CT scanner. CT showed a pontine hemorrhage. Per MD notes sh has been having trouble swallowing and gets choked by both  solids and liquids. CXR No active lung disease. Degenerative change in both shoulders. No Data Recorded Assessment / Plan / Recommendation CHL IP CLINICAL IMPRESSIONS 06/07/2016 Therapy Diagnosis Mild oral phase dysphagia;Moderate oral phase dysphagia;Mild pharyngeal phase dysphagia;Moderate pharyngeal phase dysphagia Clinical Impression Decreased lingual manipulation with prolonged mastication and transit with solid texture. Mild-mod sensory based pharyngeal dysphagia exacerbated by what appears to be large bony spurs on C4-5. Laryngeal penetration before and during swallow with thin and nectar not improved with chin tuck posture. Penetrates were transient and remained in vestibule (minimal) without awareness. Honey thick did not yield significant residue during study but may increase if pt lethargic. Recommending nectar thick followed by cough/hard throat clear, Dys 2 texture,  pills whole in applesauce, no straws and full supervision initially. ST will continue to follow.   Impact on safety and function Moderate aspiration risk   CHL IP TREATMENT RECOMMENDATION 06/07/2016 Treatment Recommendations Therapy as outlined in treatment plan below   Prognosis 06/07/2016 Prognosis for Safe Diet Advancement Good Barriers to Reach Goals -- Barriers/Prognosis Comment -- CHL IP DIET RECOMMENDATION 06/07/2016 SLP Diet Recommendations Dysphagia 2 (Fine chop) solids;Nectar thick liquid Liquid Administration via Cup;No straw Medication Administration Whole meds with puree Compensations Slow rate;Small sips/bites Postural Changes Remain semi-upright after after feeds/meals (Comment)   CHL IP OTHER RECOMMENDATIONS 06/07/2016 Recommended Consults -- Oral Care Recommendations Oral care BID Other Recommendations --   [facility] IP FOLLOW UP RECOMMENDATIONS 06/07/2016 Follow up Recommendations Inpatient Rehab   [facility] IP FREQUENCY AND DURATION 06/07/2016 Speech Therapy Frequency (ACUTE ONLY) min 2x/week Treatment Duration 2 weeks      [facility] IP ORAL PHASE 06/07/2016 Oral Phase Impaired Oral - Pudding Teaspoon -- Oral - Pudding Cup -- Oral - Honey Teaspoon -- Oral - Honey Cup WFL Oral - Nectar Teaspoon -- Oral - Nectar Cup WFL Oral - Nectar Straw -- Oral - Thin Teaspoon -- Oral - Thin Cup WFL Oral - Thin Straw -- Oral - Puree -- Oral - Mech Soft -- Oral - Regular Delayed oral transit Oral - Multi-Consistency -- Oral - Pill -- Oral Phase - Comment --  [facility] IP PHARYNGEAL PHASE 06/07/2016 Pharyngeal Phase Impaired Pharyngeal- Pudding Teaspoon -- Pharyngeal -- Pharyngeal- Pudding Cup -- Pharyngeal -- Pharyngeal- Honey Teaspoon -- Pharyngeal -- Pharyngeal- Honey Cup -- Pharyngeal -- Pharyngeal- Nectar Teaspoon -- Pharyngeal -- Pharyngeal- Nectar Cup Delayed swallow initiation-pyriform sinuses;Penetration/Aspiration during swallow Pharyngeal Material enters airway, remains ABOVE vocal cords then ejected out;Material enters airway,  remains ABOVE vocal cords and not ejected out Pharyngeal- Nectar Straw -- Pharyngeal -- Pharyngeal- Thin Teaspoon -- Pharyngeal -- Pharyngeal- Thin Cup Delayed swallow initiation-pyriform sinuses;Penetration/Aspiration before swallow;Penetration/Aspiration during swallow Pharyngeal Material enters airway, remains ABOVE vocal cords then ejected out;Material enters airway, remains ABOVE vocal cords and not ejected out Pharyngeal- Thin Straw -- Pharyngeal -- Pharyngeal- Puree -- Pharyngeal -- Pharyngeal- Mechanical Soft -- Pharyngeal -- Pharyngeal- Regular WFL Pharyngeal -- Pharyngeal- Multi-consistency -- Pharyngeal -- Pharyngeal- Pill -- Pharyngeal -- Pharyngeal Comment --  [facility] IP CERVICAL ESOPHAGEAL PHASE 06/07/2016 Cervical Esophageal Phase WFL Pudding Teaspoon -- Pudding Cup -- Honey Teaspoon -- Honey Cup -- Nectar Teaspoon -- Nectar Cup -- Nectar Straw -- Thin Teaspoon -- Thin Cup -- Thin Straw -- Puree -- Mechanical Soft -- Regular -- Multi-consistency -- Pill -- Cervical Esophageal Comment -- No flowsheet data found. [name] 06/07/2016, 1:53 PM  [name].Ed CCC-SLP Pager [phone]              Assessment/Plan: Diagnosis:  left pontine hemorrhage with right hemiparesis 1. Does the need for close, 24 hr/day medical supervision in concert with the patient's rehab needs make it unreasonable for this patient to be served in a less intensive setting? Yes 2. Co-Morbidities requiring supervision/potential complications: malignant hypertension 3. Due to bladder management, bowel management, safety, skin/wound care, disease management, medication administration, pain management and patient education, does the patient require 24 hr/day rehab nursing? Yes 4. Does the patient require coordinated care of a physician, rehab nurse, PT (1-2 hrs/day, 5 days/week), OT (1-2 hrs/day, 5 days/week) and SLP (1-2 hrs/day, 5 days/week) to address physical and functional deficits in the context of the  above medical diagnosis(es)? Yes Addressing deficits in the following areas: balance, endurance, locomotion, strength, transferring, bowel/bladder control, bathing, dressing, feeding, grooming, toileting, speech, swallowing and psychosocial support 5. Can the patient actively participate in an intensive therapy program of at least 3 hrs of therapy per day at least 5 days per week? Yes 6. The potential for patient to make measurable gains while on inpatient rehab is excellent 7. Anticipated functional outcomes upon discharge from inpatient rehab are modified independent  with PT, modified independent with OT, modified independent with SLP. 8. Estimated rehab length of stay to reach the above functional goals is: 8-12 days 9. Does the patient have adequate social supports and living environment to accommodate these discharge functional goals? Yes 10. Anticipated D/C setting: Home 11. Anticipated post D/C treatments: HH therapy and Outpatient therapy 12. Overall Rehab/Functional Prognosis: excellent  RECOMMENDATIONS: This patient's condition is appropriate for continued rehabilitative care in the following setting: CIR Patient has agreed to participate in recommended program. Yes Note that insurance prior authorization may be required for reimbursement for recommended care.  Comment: Rehab Admissions Coordinator to follow up.  Thanks,  Meredith Staggers, MD, Mellody Drown     06/08/2016

## 2016-06-08 NOTE — Progress Notes (Signed)
Physical Therapy Treatment Patient Details Name: [name] MRN: [id number] DOB: 06/03/1956 Today's Date: 06/08/2016    History of Present Illness 60 yo female with hx of HTN, DM presents from home with tremors and contractions of the L hand and face, difficulty walking and slurred speech. CTH- Acute left hemorrhagic stroke of Left pons likely due to HTN and cocaine abuse    PT Comments    Patient progressing slowly towards PT goals. Tolerated gait training with Min A for balance/safety. Pt having blurry vision and difficulty reading signs in hallway which is new per pt report. Also reports dizziness associated with blurry vision. Continues to demonstrate weakness and fatigues very quickly requiring seated rest breaks. Pt with impaired safety awareness. Will continue to follow.   Follow Up Recommendations  CIR;Supervision for mobility/OOB     Equipment Recommendations   (TBA)    Recommendations for Other Services       Precautions / Restrictions Precautions Precautions: Fall Precaution Comments: watch BP  Restrictions Weight Bearing Restrictions: No    Mobility  Bed Mobility Overal bed mobility: Needs Assistance Bed Mobility: Supine to Sit     Supine to sit: Mod assist;HOB elevated     General bed mobility comments: (A) to elevate trunk from bed surface.   Transfers Overall transfer level: Needs assistance Equipment used: Rolling walker (2 wheeled) Transfers: Sit to/from Stand Sit to Stand: Min assist         General transfer comment: Assist to power to standing; cues for hand placement/technique. Stood from [employer], from chair x1. Transferred to chair post ambulation bout.  Ambulation/Gait Ambulation/Gait assistance: Min assist Ambulation Distance (Feet): 40 Feet (+50') Assistive device: Rolling walker (2 wheeled) Gait Pattern/deviations: Step-through pattern;Step-to pattern;Decreased stride length;Trunk flexed;Shuffle Gait velocity: decreased Gait  velocity interpretation: Below normal speed for age/gender General Gait Details: Slow, unsteady gait with varying speeds. Pt with blurry vision. Fatigues quickly. 1 seated rest break.   Stairs            Wheelchair Mobility    Modified Rankin (Stroke Patients Only) Modified Rankin (Stroke Patients Only) Pre-Morbid Rankin Score: No symptoms Modified Rankin: Moderately severe disability     Balance Overall balance assessment: Needs assistance Sitting-balance support: Feet supported;No upper extremity supported Sitting balance-Leahy Scale: Good     Standing balance support: During functional activity;Bilateral upper extremity supported Standing balance-Leahy Scale: Poor                      Cognition Arousal/Alertness: Awake/alert Behavior During Therapy: WFL for tasks assessed/performed Overall Cognitive Status: Impaired/Different from baseline Area of Impairment: Safety/judgement     Memory: Decreased short-term memory   Safety/Judgement: Decreased awareness of safety;Decreased awareness of deficits     General Comments: Requires cues for safety.     Exercises      General Comments General comments (skin integrity, edema, etc.): Pt's significant other and caretaker present during session.      Pertinent Vitals/Pain Pain Assessment: No/denies pain    Home Living Family/patient expects to be discharged to:: Private residence Living Arrangements: Spouse/significant other;Other relatives                  Prior Function            PT Goals (current goals can now be found in the care plan section) Progress towards PT goals: Progressing toward goals    Frequency    Min 4X/week      PT Plan Current plan  remains appropriate    Co-evaluation             End of Session Equipment Utilized During Treatment: Gait belt Activity Tolerance: Patient tolerated treatment well Patient left: in chair;with call bell/phone within reach;with  chair alarm set;with family/visitor present     Time: [id number] PT Time Calculation (min) (ACUTE ONLY): 27 min  Charges:  $Gait Training: 23-37 mins                    G Codes:      [place] 06/08/2016, 1:14 PM [name], [place], DPT [phone]

## 2016-06-08 NOTE — Progress Notes (Signed)
Patient's Lantus D/C'd according to pharmacy since Novolog 70/30 was added (pt's home medication). Continuing to monitor. Scott Vanderveer, Rande Brunt, RN

## 2016-06-08 NOTE — Progress Notes (Signed)
Results for TERRIANN, ALBERGO (MRN [id number]) as of 06/08/2016 09:07  Ref. Range 06/07/2016 12:25 06/07/2016 15:43 06/07/2016 19:59 06/07/2016 23:47 06/08/2016 03:42  Glucose-Capillary Latest Ref Range: 65 - 99 mg/dL 267 (H) 241 (H) 401 (H) 70 313 (H)   Noted that CBGs continue to be greater tahn 180 mg/dl. Noted that blood sugar last night went down to 70 mg/dl after receiving Novolog 20 units for the 401 mg/dl CBG. Recommend increasing Lantus to 14 units daily and titrate as needed. Patient takes 70/30 40 units BID at home. Will continue to monitor blood sugars while in the hospital. Harvel Ricks RN BSN CDE

## 2016-06-08 NOTE — Progress Notes (Signed)
Hypoglycemic Event  CBG: 60  Treatment: 15 GM carbohydrate snack  Symptoms: None  Follow-up CBG: Time: CBG Result:151  Possible Reasons for Event: Inadequate meal intake States she only had pudding for lunch  Comments/MD notified:Dr. Shon Hale notified    Melvenia Beam

## 2016-06-08 NOTE — Progress Notes (Signed)
STROKE TEAM PROGRESS NOTE   HISTORY OF PRESENT ILLNESS (per record) This is a 60-yo RH woman who was brought to the [facility] ED by EMS. History is obtained from the patient who is a fair historian. Additional information is obtained from review of the patient's medical record.   Per notes, EMS was called to the patient's home because she was having tremors and contractions of the L hand and face. She reported to them that she was having trouble walking with weakness of the L hand since last night. Per ED MD note, EMS reported that the patient was alert and oriented and able to walk for them. ED PA note indicates that the caregiver reported progressive difficult walking with slurred speech for the past week. She developed weakness and tremors in the left hand 3-4 days ago. In the ED, she was initially alert and oriented and able to get off the gurney to go to the bathroom. However, after returning from the bathroom she had a sudden decrease in level of arousal, unable to follow commands and repeating her birthday anytime someone asked her a question. There was concern for ICH and she was sent for a STAT CT of the head. While in the scanner she reportedly had another seizure and was given Ativan. CTH showed a pontine hemorrhage. She has been hypertensive in the ED with SBP 151-202. She has been placed on a nicardipine drip and received a Keppra load of 1750 mg.  Her mentation has steadily improved after her seizure. On my assessment, she is alert and able to answer questions. She tells me that she has been having trouble speaking and using her left hand for 3-4 weeks. In addition, she has had trouble walking and reports a fall at home two days ago that occurred because she was unable to control her leg. She has a non-specific headache with significant nausea, no vomiting. She has been having trouble swallowing and gets choked by both solids and liquids. She has blurred vision but denies double vision or  frank vision loss. She states that symptoms got worse today and this prompted her to call 911.   She denies any prior history of stroke or hemorrhage. She has no prior history of seizure. She admits to using crack cocaine every other day and last used yesterday.   SUBJECTIVE (INTERVAL HISTORY) Patient is stable. No changes in exam. MRI confirms subacute left Pontine hematoma but no significant mass effect   OBJECTIVE Temp:  [97.7 F (36.5 C)-98.7 F (37.1 C)] 97.7 F (36.5 C) (11/30 1200) Pulse Rate:  [79-120] 98 (11/30 1300) Cardiac Rhythm: Normal sinus rhythm;Sinus tachycardia (11/30 0800) Resp:  [14-28] 22 (11/30 1100) BP: (96-198)/(38-107) 180/92 (11/30 1200) SpO2:  [91 %-100 %] 100 % (11/30 1300)  CBC:   Recent Labs Lab 06/06/16 1508  WBC 10.8*  NEUTROABS 7.7  HGB 16.1*  HCT 46.7*  MCV 93.6  PLT 123456    Basic Metabolic Panel:   Recent Labs Lab 06/07/16 1053 06/07/16 1352 06/08/16 0241  NA 146*  --  143  K 2.8*  --  2.9*  CL 114*  --  113*  CO2 21*  --  19*  GLUCOSE 167*  --  277*  BUN 8  --  10  CREATININE 1.38*  --  1.37*  CALCIUM 8.4*  --  8.2*  MG  --  1.5*  --     Lipid Panel:     Component Value Date/Time   CHOL 157 06/06/2016  1508   TRIG 214 (H) 06/06/2016 1508   HDL 33 (L) 06/06/2016 1508   CHOLHDL 4.8 06/06/2016 1508   VLDL 43 (H) 06/06/2016 1508   LDLCALC 81 06/06/2016 1508   HgbA1c:  Lab Results  Component Value Date   HGBA1C 11.4 (H) 06/06/2016   Urine Drug Screen:     Component Value Date/Time   LABOPIA NONE DETECTED 06/06/2016 1832   COCAINSCRNUR POSITIVE (A) 06/06/2016 1832   COCAINSCRNUR (A) 03/19/2008 2343    POSITIVE (NOTE) Sent for confirmatory testing Result repeated and verified.   LABBENZ NONE DETECTED 06/06/2016 1832   LABBENZ (A) 03/19/2008 2343    POSITIVE (NOTE) Sent for confirmatory testing Result repeated and verified.   AMPHETMU NONE DETECTED 06/06/2016 1832   THCU NONE DETECTED 06/06/2016 1832   LABBARB  NONE DETECTED 06/06/2016 1832      IMAGING  Ct Head Wo Contrast  Result Date: 06/06/2016 CLINICAL DATA:  Confusion with left arm weakness and aphasia EXAM: CT HEAD WITHOUT CONTRAST TECHNIQUE: Contiguous axial images were obtained from the base of the skull through the vertex without intravenous contrast. COMPARISON:  None. FINDINGS: Brain: There is mild diffuse atrophy. There is focal hemorrhage arising in the left pons which extends slightly to the right of midline in the lower pons region. There is mild surrounding edema in this area. This hemorrhage at its maximum measures 1.3 x 1.1 cm. No other hemorrhage is seen. There is no well-defined mass. There is no extra-axial fluid collection or midline shift. There is mild small vessel disease in the centra semiovale bilaterally. Vascular: There is no hyperdense vessel. There is calcification in each carotid siphon region. Skull: The bony calvarium appears intact. Sinuses/Orbits: There is opacification of a right ethmoid air cell. Visualized paranasal sinuses elsewhere clear. There is rightward deviation of the nasal septum. Orbits appear symmetric bilaterally. Other: Visualized mastoid air cells are clear. IMPRESSION: Acute hemorrhage arising from the left pons and extending across the midline to the medial right lower pons near in the inferior pontine region. Suspect hemorrhagic infarct, most likely secondary to hypertension. There is mild atrophy with mild periventricular small vessel disease. No other hemorrhage evident. There are foci of arterial vascular calcification in the carotid siphon regions. There is right ethmoid sinus disease. There is a deviated nasal septum. Critical Value/emergent results were called by telephone at the time of interpretation on 06/06/2016 at 3:58 pm to [facility] [place], who verbally acknowledged these results. Electronically Signed   By: Lowella Grip III M.D.   On: 06/06/2016 16:02   Mr [name] Head Wo Contrast  Result  Date: 06/07/2016 CLINICAL DATA:  60 year old female with pontine hemorrhage, presenting with progressive neurologic deficits which began 1 week ago. Initial encounter. EXAM: MRI HEAD WITHOUT CONTRAST MRA HEAD WITHOUT CONTRAST TECHNIQUE: Multiplanar, multiecho pulse sequences of the brain and surrounding structures were obtained without intravenous contrast. Angiographic images of the head were obtained using MRA technique without contrast. COMPARISON:  Head CT without contrast 06/06/2016. FINDINGS: MRI HEAD FINDINGS Brain: Intra-axial hemorrhage centered in the left pons with intrinsic T1 hyperintensity and mixed T2 signal (subacute, methemoglobin) encompassing 9 x 14 x 19 mm (estimated blood volume 1 mL). Mild surrounding edema. Minimal mass effect. No extension to the adjacent brainstem or cerebellum. Associated susceptibility artifact on diffusion weighted imaging, with no larger area of brainstem restricted diffusion. There is a small nearby chronic lacunar infarct in the right pons (series 10, image 10). There is a small chronic lacunar infarct in  the left thalamus (series 10, image 14). No other parenchymal blood products are identified. No cortical encephalomalacia, and otherwise gray and white matter signal is normal. No restricted diffusion elsewhere. No midline shift, mass effect, evidence of mass lesion, ventriculomegaly, or extra-axial collection. Cervicomedullary junction and pituitary are within normal limits. Vascular: Major intracranial vascular flow voids are preserved, with mild intracranial artery tortuosity. Skull and upper cervical spine: Disc bulging and endplate degeneration including bulky anterior endplate osteophytes at C3-C4. Visualized bone marrow signal is within normal limits. Sinuses/Orbits: Negative orbits soft tissues. Mild right ethmoid and left sphenoid sinus mucosal thickening. Other: Visible internal auditory structures appear normal. Mastoids are clear. Negative scalp soft  tissues. 2 cm soft tissue nodule in the inferior left parotid space (series 11, image 22). This appears fairly isointense to the gland on coronal T2 imaging (series 13, image 13). The lesion is conspicuous on diffusion. Normal left stylomastoid foramen. MRA HEAD FINDINGS Antegrade flow in the posterior circulation with dominant distal right vertebral artery. Distal vertebral tortuosity without stenosis. Normal left PICA origin. Mildly tortuous but otherwise normal basilar artery without irregularity or stenosis. Normal SCA and PCA origins. The left posterior communicating artery is present, the right is diminutive or absent. Normal bilateral PCA branches. No abnormal vessels demonstrated about the brainstem hemorrhage. Antegrade flow in both ICA siphons. Bilateral cavernous segment tortuosity and irregularity, greater on the left. No hemodynamically significant siphon stenosis. 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 The left ophthalmic artery origin is normal. The left posterior communicating artery origin is normal. Normal carotid termini. Mild irregularity at the left MCA origin without significant stenosis. Normal ACA and right MCA origins. Tortuous A1 segments. Anterior communicating artery and visualized ACA branches are within normal limits MCA M1 segments are moderately tortuous. MCA bifurcations and visualized MCA branches are within normal limits. IMPRESSION: 1. 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 Blood volume estimated at 1 mL. Mild surrounding edema without significant mass effect. Burtis Junes this is small vessel disease related with a nearby chronic right pontine lacunar infarct, and a chronic left thalamic lacunar infarct. 2. 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 MRA also reveals mild  generalized intracranial artery tortuosity, and left greater than right ICA siphon atherosclerosis. 3. Two cm round soft tissue mass in the inferior left parotid space, with T2 signal nearly isointense to the gland suggesting a well differentiated Parotid Neoplasm. Recommend ENT follow-up. Electronically Signed   By: Genevie Ann M.D.   On: 06/07/2016 17:25   Mr Brain Wo Contrast  Result Date: 06/07/2016 CLINICAL DATA:  59 year old female with pontine hemorrhage, presenting with progressive neurologic deficits which began 1 week ago. Initial encounter. EXAM: MRI HEAD WITHOUT CONTRAST MRA HEAD WITHOUT CONTRAST TECHNIQUE: Multiplanar, multiecho pulse sequences of the brain and surrounding structures were obtained without intravenous contrast. Angiographic images of the head were obtained using MRA technique without contrast. COMPARISON:  Head CT without contrast 06/06/2016. FINDINGS: MRI HEAD FINDINGS Brain: Intra-axial hemorrhage centered in the left pons with intrinsic T1 hyperintensity and mixed T2 signal (subacute, methemoglobin) encompassing 9 x 14 x 19 mm (estimated blood volume 1 mL). Mild surrounding edema. Minimal mass effect. No extension to the adjacent brainstem or cerebellum. Associated susceptibility artifact on diffusion weighted imaging, with no larger area of brainstem restricted diffusion. There is a  small nearby chronic lacunar infarct in the right pons (series 10, image 10). There is a small chronic lacunar infarct in the left thalamus (series 10, image 14). No other parenchymal blood products are identified. No cortical encephalomalacia, and otherwise gray and white matter signal is normal. No restricted diffusion elsewhere. No midline shift, mass effect, evidence of mass lesion, ventriculomegaly, or extra-axial collection. Cervicomedullary junction and pituitary are within normal limits. Vascular: Major intracranial vascular flow voids are preserved, with mild intracranial artery tortuosity.  Skull and upper cervical spine: Disc bulging and endplate degeneration including bulky anterior endplate osteophytes at C3-C4. Visualized bone marrow signal is within normal limits. Sinuses/Orbits: Negative orbits soft tissues. Mild right ethmoid and left sphenoid sinus mucosal thickening. Other: Visible internal auditory structures appear normal. Mastoids are clear. Negative scalp soft tissues. 2 cm soft tissue nodule in the inferior left parotid space (series 11, image 22). This appears fairly isointense to the gland on coronal T2 imaging (series 13, image 13). The lesion is conspicuous on diffusion. Normal left stylomastoid foramen. MRA HEAD FINDINGS Antegrade flow in the posterior circulation with dominant distal right vertebral artery. Distal vertebral tortuosity without stenosis. Normal left PICA origin. Mildly tortuous but otherwise normal basilar artery without irregularity or stenosis. Normal SCA and PCA origins. The left posterior communicating artery is present, the right is diminutive or absent. Normal bilateral PCA branches. No abnormal vessels demonstrated about the brainstem hemorrhage. Antegrade flow in both ICA siphons. Bilateral cavernous segment tortuosity and irregularity, greater on the left. No hemodynamically significant siphon stenosis. 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 The left ophthalmic artery origin is normal. The left posterior communicating artery origin is normal. Normal carotid termini. Mild irregularity at the left MCA origin without significant stenosis. Normal ACA and right MCA origins. Tortuous A1 segments. Anterior communicating artery and visualized ACA branches are within normal limits MCA M1 segments are moderately tortuous. MCA bifurcations and visualized MCA branches are within normal limits. IMPRESSION: 1. 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 Blood volume  estimated at 1 mL. Mild surrounding edema without significant mass effect. Burtis Junes this is small vessel disease related with a nearby chronic right pontine lacunar infarct, and a chronic left thalamic lacunar infarct. 2. 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 MRA also reveals mild generalized intracranial artery tortuosity, and left greater than right ICA siphon atherosclerosis. 3. Two cm round soft tissue mass in the inferior left parotid space, with T2 signal nearly isointense to the gland suggesting a well differentiated Parotid Neoplasm. Recommend ENT follow-up. Electronically Signed   By: Genevie Ann M.D.   On: 06/07/2016 17:25   Dg Chest Portable 1 View  Result Date: 06/06/2016 CLINICAL DATA:  Left hand tremors, left-sided facial tremor, difficulty ambulating EXAM: PORTABLE CHEST 1 VIEW COMPARISON:  Chest x-ray of 05/16/2016 FINDINGS: No pneumonia or effusion is seen. Mediastinal and hilar contours are unremarkable. The heart is within upper limits of normal. No acute bony abnormality is seen. There are degenerative changes in the shoulders present. IMPRESSION: No active lung disease.  Degenerative change in both shoulders. Electronically Signed   By: Ivar Drape M.D.   On: 06/06/2016 17:15   Dg Swallowing Func-speech Pathology  Result Date: 06/07/2016 Objective Swallowing Evaluation: Type of Study: MBS-Modified Barium Swallow Study Patient Details Name: Adesola Duprey MRN:  [id number] Date of Birth: [birth year] Today's Date: 06/07/2016 Time: SLP Start Time (ACUTE ONLY): 1143-SLP Stop Time (ACUTE ONLY): 1203 SLP Time Calculation (min) (ACUTE ONLY): 20 min Past Medical History: Past Medical History: Diagnosis Date . Diabetes mellitus   Hb A1C = 12.6 on 05/15/11, managed on Novolog 70/30, 35 U qam, 25 U qpm . Hypertension   poorly controlled . Shortness of breath  Past Surgical History: Past  Surgical History: Procedure Laterality Date . ANKLE FRACTURE SURGERY  2007 HPI: 89-yo woman with history of DM, poorly controlled,HTN, SOB, currently uses crack cocaine admitted with tremors/contractions of the L hand and face, trouble walking with weakness of the L hand. Pt had seizure while in CT scanner. CT showed a pontine hemorrhage. Per MD notes sh has been having trouble swallowing and gets choked by both solids and liquids. CXR No active lung disease. Degenerative change in both shoulders. No Data Recorded Assessment / Plan / Recommendation CHL IP CLINICAL IMPRESSIONS 06/07/2016 Therapy Diagnosis Mild oral phase dysphagia;Moderate oral phase dysphagia;Mild pharyngeal phase dysphagia;Moderate pharyngeal phase dysphagia Clinical Impression Decreased lingual manipulation with prolonged mastication and transit with solid texture. Mild-mod sensory based pharyngeal dysphagia exacerbated by what appears to be large bony spurs on C4-5. Laryngeal penetration before and during swallow with thin and nectar not improved with chin tuck posture. Penetrates were transient and remained in vestibule (minimal) without awareness. Honey thick did not yield significant residue during study but may increase if pt lethargic. Recommending nectar thick followed by cough/hard throat clear, Dys 2 texture, pills whole in applesauce, no straws and full supervision initially. ST will continue to follow.   Impact on safety and function Moderate aspiration risk   CHL IP TREATMENT RECOMMENDATION 06/07/2016 Treatment Recommendations Therapy as outlined in treatment plan below   Prognosis 06/07/2016 Prognosis for Safe Diet Advancement Good Barriers to Reach Goals -- Barriers/Prognosis Comment -- CHL IP DIET RECOMMENDATION 06/07/2016 SLP Diet Recommendations Dysphagia 2 (Fine chop) solids;Nectar thick liquid Liquid Administration via Cup;No straw Medication Administration Whole meds with puree Compensations Slow rate;Small sips/bites Postural  Changes Remain semi-upright after after feeds/meals (Comment)   CHL IP OTHER RECOMMENDATIONS 06/07/2016 Recommended Consults -- Oral Care Recommendations Oral care BID Other Recommendations --   CHL IP FOLLOW UP RECOMMENDATIONS 06/07/2016 Follow up Recommendations Inpatient Rehab   CHL IP FREQUENCY AND DURATION 06/07/2016 Speech Therapy Frequency (ACUTE ONLY) min 2x/week Treatment Duration 2 weeks      CHL IP ORAL PHASE 06/07/2016 Oral Phase Impaired Oral - Pudding Teaspoon -- Oral - Pudding Cup -- Oral - Honey Teaspoon -- Oral - Honey Cup WFL Oral - Nectar Teaspoon -- Oral - Nectar Cup WFL Oral - Nectar Straw -- Oral - Thin Teaspoon -- Oral - Thin Cup WFL Oral - Thin Straw -- Oral - Puree -- Oral - Mech Soft -- Oral - Regular Delayed oral transit Oral - Multi-Consistency -- Oral - Pill -- Oral Phase - Comment --  CHL IP PHARYNGEAL PHASE 06/07/2016 Pharyngeal Phase Impaired Pharyngeal- Pudding Teaspoon -- Pharyngeal -- Pharyngeal- Pudding Cup -- Pharyngeal -- Pharyngeal- Honey Teaspoon -- Pharyngeal -- Pharyngeal- Honey Cup -- Pharyngeal -- Pharyngeal- Nectar Teaspoon -- Pharyngeal -- Pharyngeal- Nectar Cup Delayed swallow initiation-pyriform sinuses;Penetration/Aspiration during swallow Pharyngeal Material enters airway, remains ABOVE vocal cords then ejected out;Material enters airway, remains ABOVE vocal cords and not ejected out Pharyngeal- Nectar Straw -- Pharyngeal -- Pharyngeal- Thin Teaspoon -- Pharyngeal -- Pharyngeal- Thin Cup Delayed swallow initiation-pyriform sinuses;Penetration/Aspiration before swallow;Penetration/Aspiration during swallow Pharyngeal Material enters airway, remains  ABOVE vocal cords then ejected out;Material enters airway, remains ABOVE vocal cords and not ejected out Pharyngeal- Thin Straw -- Pharyngeal -- Pharyngeal- Puree -- Pharyngeal -- Pharyngeal- Mechanical Soft -- Pharyngeal -- Pharyngeal- Regular WFL Pharyngeal -- Pharyngeal- Multi-consistency -- Pharyngeal -- Pharyngeal-  Pill -- Pharyngeal -- Pharyngeal Comment --  CHL IP CERVICAL ESOPHAGEAL PHASE 06/07/2016 Cervical Esophageal Phase WFL Pudding Teaspoon -- Pudding Cup -- Honey Teaspoon -- Honey Cup -- Nectar Teaspoon -- Nectar Cup -- Nectar Straw -- Thin Teaspoon -- Thin Cup -- Thin Straw -- Puree -- Mechanical Soft -- Regular -- Multi-consistency -- Pill -- Cervical Esophageal Comment -- No flowsheet data found. [name] 06/07/2016, 1:53 PM  [name].Ed CCC-SLP Pager [phone]             PHYSICAL EXAM General: obese female, awake, talking. A&ox4.  HEENT: [place]/at.  CV: rrr, no m/r/g Neurological Exam :  : Awake and alert oriented 3 with normal speech and language function. Pupils equal reactive. Fundi were not visualized. full visual field. Mild right lower facial asymmetry when she smiles. Sensation intact b/l on face and exts. 5/5 str bilaterally upper and lower ext. except mild weakness of right grip and intrinsic hand muscles. Orbits left over right upper extremity. Speech is normal. 2+ DTR. Normal finger to nose. Gait not tested ASSESSMENT/PLAN [name] is a 60 y.o. female with history of DM II, HTN, cocaine abuse, medication noncomlpiance presenting with slurred speech, R arm weakness and numbness. She did not receive IV t-PA due to hemorrhagic stroke.    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  Resultant  Slurred speech, arm weakness, numbness  MRI  Left pontine subacute hematoma  MRA  No large vessel stenosis  Carotid Doppler   ordered  2D Echo    ordered  LDL 81  HgbA1c 12.6  SCD or VTE prophylaxis DIET DYS 2 Room service appropriate? Yes; Fluid consistency: Nectar Thick  No antithrombotic prior to admission, now on No antithrombotic  Ongoing aggressive stroke risk factor management  Therapy recommendations:  pending  Disposition:  Pending  Patient had a hemorrhagic infarct of left pons likely few days  ago possibly due to her uncontrolled HTN and cocaine abuse.   Will get MRI. If no new bleeding, may transfer out to tele floor.  Hypertension  Stable. Off cardene gtt.  Goal <180.  Hyperlipidemia  Home meds:  None  LDL 81, goal < 70  Consider adding Lipitor 40mg  daily  Uncontrolled Diabetes with Hyperglycemia >600 Hyperglycemia improved without requiring insulin gtt. Now on   HgbA1c 12.6 goal < 7.0  Has medication noncompliance with her home meds. Will add Lantus 7 units nightly and continue SSI.  Other Stroke Risk Factors  Advanced age  Cigarette and cocaine abuse - advised cessation.   ETOH use  Obesity, Body mass index is 36.48 kg/m., recommend weight loss, diet and exercise as appropriate   DM II and HTN  Other Active Problems  Hypokalemia - repleting.   Hospital day # 2    I have personally examined this patient, reviewed notes, independently viewed imaging studies, participated in medical decision making and plan of care.ROS completed by me personally and pertinent positives fully documented  I have made any additions or clarifications directly to the above note. Agree with note above. Agree with strict blood pressure control and close neurological monitoring. Start oral home meds. Transfer to floor. Mobilize out of  bed with therapy evals. Greater than 50% time during this 35 minute visit was spent on counseling and coordination of care about stroke risk, prevention and treatment   .  Antony Contras, MD Medical Director [facility] Pager: [phone] 06/08/2016 1:52 PM  To contact Stroke Continuity provider, please refer to [email or web address] After hours, contact General Neurology

## 2016-06-08 NOTE — H&P (Signed)
Physical Medicine and Rehabilitation Admission H&P    Chief Complaint  Patient presents with  . Altered Mental Status    pt last Wed. began to lose ability in her L hand and was unable to ambulate without assist as per her caregiver since than pt's L hand has began to have tremors with facial involvement that strted last night   : HPI: [name] is a 60 y.o. right handed female with history of poorly controlled diabetes mellitus, hypertension,Chronic back pain, tobacco cocaine abuse. Per chart review patient lives with significant other and independent prior to admission. 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 Presented 06/06/2016 with tremors and reported weakness of the left hand and facial droop as well as slurred speech over the past 3-4 days with increasing lethargy as well as a recent fall 2 days prior to admission. SBP 151-202. Urine drug screen positive for cocaine. Troponin negative.Mildly elevated creatinine 1.7. 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 While in the CT scan reported seizure and was given Ativan. EEG showed no seizure activity. Placed on nicardipine  drip for blood pressure control. Loaded with Keppra for seizure prophylaxis. Follow-up MRI/MRA showed stable hemorrhage, no related findings on intracranial MRA.Echocardiogram and carotid Dopplers are pending. Hemoglobin A1c 11.4 with insulin therapy as directed. Currently on a dysphagia #2 nectar thick liquid diet.Bouts of hypokalemia 2.9 with supplement added. Physical and occupational therapy evaluation completed 06/07/2016 with recommendations of physical medicine rehabilitation consult.Patient was admitted for a comprehensive rehabilitation program  Review of Systems  Constitutional: Negative for chills and fever.  HENT: Negative for hearing loss.     Eyes: Negative for blurred vision and double vision.  Respiratory: Positive for cough and shortness of breath.   Cardiovascular: Negative for chest pain, palpitations and leg swelling.  Gastrointestinal: Positive for constipation and heartburn. Negative for melena, nausea and vomiting.  Genitourinary: Negative for dysuria, flank pain and urgency.  Musculoskeletal: Positive for myalgias. Negative for falls.  Skin: Negative for rash.  Neurological: Positive for dizziness, tremors, sensory change and headaches. Negative for seizures and loss of consciousness.  Psychiatric/Behavioral: The patient has insomnia.   All other systems reviewed and are negative.  Past Medical History:  Diagnosis Date  . Diabetes mellitus    Hb A1C = 12.6 on 05/15/11, managed on Novolog 70/30, 35 U qam, 25 U qpm  . Hypertension    poorly controlled  . Shortness of breath    Past Surgical History:  Procedure Laterality Date  . ANKLE FRACTURE SURGERY  2007   No family history on file. Social History:  reports that she has been smoking.  She has a 12.50 pack-year smoking history. She has never used smokeless tobacco. She reports that she drinks about 7.2 oz of alcohol per week . She reports that she uses drugs, including Cocaine. Allergies: No Known Allergies Medications Prior to Admission  Medication Sig Dispense Refill  . acyclovir (ZOVIRAX) 400 MG tablet Take 1 tablet (400 mg total) by mouth 4 (four) times daily. 50 tablet 0  . amLODipine (NORVASC) 10 MG tablet Take 10 mg by mouth daily.    . cyclobenzaprine (FLEXERIL) 10 MG tablet Take 1 tablet (10 mg total) by mouth 2 (two) times daily as needed for muscle spasms. 10 tablet 0  . furosemide (LASIX) 20 MG tablet Take  20 mg by mouth daily as needed (for swollen legs).   0  . gabapentin (NEURONTIN) 300 MG capsule Take 600 mg by mouth 4 (four) times daily.    Marland Kitchen HYDROcodone-acetaminophen (NORCO/VICODIN) 5-325 MG per tablet Take 2 tablets by mouth every 6 (six)  hours as needed for pain. 15 tablet 0  . ibuprofen (ADVIL,MOTRIN) 400 MG tablet Take 2 tablets (800 mg total) by mouth every 6 (six) hours as needed. For pain (Patient taking differently: Take 800 mg by mouth every 6 (six) hours as needed (for pain). ) 30 tablet 0  . lisinopril (PRINIVIL,ZESTRIL) 10 MG tablet Take 2 tablets (20 mg total) by mouth daily. (Patient not taking: Reported on 06/06/2016) 60 tablet 0  . lisinopril-hydrochlorothiazide (PRINZIDE,ZESTORETIC) 20-12.5 MG tablet Take 1 tablet by mouth daily.  0  . NOVOLOG MIX 70/30 FLEXPEN (70-30) 100 UNIT/ML FlexPen Inject 40 Units into the skin 2 (two) times daily. MORNING AND BEDTIME  0  . simvastatin (ZOCOR) 10 MG tablet Take 10 mg by mouth at bedtime.  0  . traMADol (ULTRAM) 50 MG tablet Take 50 mg by mouth daily as needed (for pain).    . traZODone (DESYREL) 100 MG tablet Take 100 mg by mouth at bedtime.  0  . [DISCONTINUED] lisinopril (PRINIVIL,ZESTRIL) 20 MG tablet Take 1 tablet (20 mg total) by mouth daily. (Patient not taking: Reported on 06/06/2016) 30 tablet 1    Home: Home Living Family/patient expects to be discharged to:: Private residence Living Arrangements: Spouse/significant other Available Help at Discharge: Family Type of Home: House Home Access: Elevator Home Layout: One level Bathroom Shower/Tub: Health visitor: Standard Home Equipment: None (all DME is for boyfriend )  Lives With: Significant other   Functional History: Prior Function Level of Independence: Independent Comments: helping take care of boyfriend that she has to dress and bath. [name] ( boyfriend) RN comes Monday - Friday to help his caregiver  Functional Status:  Mobility: Bed Mobility Overal bed mobility: Needs Assistance Bed Mobility: Supine to Sit Supine to sit: Mod assist General bed mobility comments: (A) to elevate trunk from bed surface. bil LE (A) to eob.  Transfers Overall transfer level: Needs  assistance Equipment used: Rolling walker (2 wheeled) Transfers: Sit to/from Stand Sit to Stand: Mod assist Stand pivot transfers: Min assist General transfer comment: Assist to power to standing with pt pulling up on RW. Trembling in BLEs upon standing. SPT to chair with Min A for balance due to bil knee instability.       ADL: ADL Overall ADL's : Needs assistance/impaired Eating/Feeding: Supervision/ safety Eating/Feeding Details (indicate cue type and reason): repeatly asking for juice or ice Grooming: Wash/dry face, Wash/dry hands, Set up Toilet Transfer: Moderate assistance Toileting- Clothing Manipulation and Hygiene: Minimal assistance Toileting - Clothing Manipulation Details (indicate cue type and reason): (A) for steady. standing without (A) requires bil Ue to power off 3n1 General ADL Comments: pt declined 3n1 with therapy but requesting it with RN arrival at end of session. Ot (A)ing with transfer  Cognition: Cognition Overall Cognitive Status: Impaired/Different from baseline Arousal/Alertness: Awake/alert Orientation Level: Oriented X4 Attention: Sustained Sustained Attention: Appears intact Memory: Appears intact (for 4 words) Awareness: Impaired Awareness Impairment: Anticipatory impairment, Emergent impairment Problem Solving: Impaired Problem Solving Impairment: Verbal basic Safety/Judgment:  (questionable) Cognition Arousal/Alertness: Awake/alert Behavior During Therapy: WFL for tasks assessed/performed Overall Cognitive Status: Impaired/Different from baseline Area of Impairment: Awareness, Safety/judgement Memory: Decreased short-term memory Safety/Judgement: Decreased awareness of safety, Decreased awareness of  deficits Awareness: Emergent General Comments: Pt requires cues for safety. pt repeating same questions in session even after being provided an answer  Physical Exam: Blood pressure (!) 172/97, pulse (!) 107, temperature 98.1 F (36.7 C),  temperature source Oral, resp. rate (!) 23, height '5\' 3"'$  (1.6 m), weight 93.4 kg (205 lb 14.6 oz), SpO2 100 %. Physical Exam  Constitutional: She appears well-developed.  Eyes: EOM are normal.  Pupils round and reactive to light  Neck: Normal range of motion. Neck supple. No thyromegaly present.  Cardiovascular: Normal rate, regular rhythm and normal heart sounds.   Respiratory: Effort normal and breath sounds normal. No respiratory distress.  GI: Soft. Bowel sounds are normal. She exhibits no distension.  Neurological: She is alert.  Alert. Speech is dysarthric but intelligible. Reasonable insight and awareness. Follows all commands. . Right central 7.  Marland KitchenRUE 3-4/5 bicep, tricep, wrist, hand. RLE 4/5 prox to distal. LUE 5/5. LLE 5/5. No sensory findings Skin: Skin is warm and dry.  Psychiatric: normal mood  Results for orders placed or performed during the hospital encounter of 06/06/16 (from the past 48 hour(s))  CBG monitoring, ED     Status: Abnormal   Collection Time: 06/06/16  2:53 PM  Result Value Ref Range   Glucose-Capillary >600 (HH) 65 - 99 mg/dL   Comment 1 Notify RN    Comment 2 Document in Chart   CBC with Differential     Status: Abnormal   Collection Time: 06/06/16  3:08 PM  Result Value Ref Range   WBC 10.8 (H) 4.0 - 10.5 K/uL   RBC 4.99 3.87 - 5.11 MIL/uL   Hemoglobin 16.1 (H) 12.0 - 15.0 g/dL   HCT 46.7 (H) 36.0 - 46.0 %   MCV 93.6 78.0 - 100.0 fL   MCH 32.3 26.0 - 34.0 pg   MCHC 34.5 30.0 - 36.0 g/dL   RDW 14.1 11.5 - 15.5 %   Platelets 315 150 - 400 K/uL   Neutrophils Relative % 70 %   Neutro Abs 7.7 1.7 - 7.7 K/uL   Lymphocytes Relative 23 %   Lymphs Abs 2.5 0.7 - 4.0 K/uL   Monocytes Relative 6 %   Monocytes Absolute 0.6 0.1 - 1.0 K/uL   Eosinophils Relative 1 %   Eosinophils Absolute 0.1 0.0 - 0.7 K/uL   Basophils Relative 0 %   Basophils Absolute 0.0 0.0 - 0.1 K/uL  Comprehensive metabolic panel     Status: Abnormal   Collection Time: 06/06/16  3:08  PM  Result Value Ref Range   Sodium 136 135 - 145 mmol/L   Potassium 3.0 (L) 3.5 - 5.1 mmol/L   Chloride 101 101 - 111 mmol/L   CO2 25 22 - 32 mmol/L   Glucose, Bld 650 (HH) 65 - 99 mg/dL    Comment: CRITICAL RESULT CALLED TO, READ BACK BY AND VERIFIED WITH: D SHAW,RN [id number] 06/06/2016 WBOND    BUN 11 6 - 20 mg/dL   Creatinine, Ser 1.74 (H) 0.44 - 1.00 mg/dL   Calcium 9.0 8.9 - 10.3 mg/dL   Total Protein 7.7 6.5 - 8.1 g/dL   Albumin 2.5 (L) 3.5 - 5.0 g/dL   AST 17 15 - 41 U/L   ALT 14 14 - 54 U/L   Alkaline Phosphatase 54 38 - 126 U/L   Total Bilirubin 0.4 0.3 - 1.2 mg/dL   GFR calc non Af Amer 31 (L) >60 mL/min   GFR calc Af Amer 36 (L) >60 mL/min  Comment: (NOTE) The eGFR has been calculated using the CKD EPI equation. This calculation has not been validated in all clinical situations. eGFR's persistently <60 mL/min signify possible Chronic Kidney Disease.    Anion gap 10 5 - 15  Lipid panel     Status: Abnormal   Collection Time: 06/06/16  3:08 PM  Result Value Ref Range   Cholesterol 157 0 - 200 mg/dL   Triglycerides 082 (H) <150 mg/dL   HDL 33 (L) >91 mg/dL   Total CHOL/HDL Ratio 4.8 RATIO   VLDL 43 (H) 0 - 40 mg/dL   LDL Cholesterol 81 0 - 99 mg/dL    Comment:        Total Cholesterol/HDL:CHD Risk Coronary Heart Disease Risk Table                     Men   Women  1/2 Average Risk   3.4   3.3  Average Risk       5.0   4.4  2 X Average Risk   9.6   7.1  3 X Average Risk  23.4   11.0        Use the calculated Patient Ratio above and the CHD Risk Table to determine the patient's CHD Risk.        ATP III CLASSIFICATION (LDL):  <100     mg/dL   Optimal  631-415  mg/dL   Near or Above                    Optimal  130-159  mg/dL   Borderline  640-816  mg/dL   High  >854     mg/dL   Very High   Hemoglobin A1c     Status: Abnormal   Collection Time: 06/06/16  3:08 PM  Result Value Ref Range   Hgb A1c MFr Bld 11.4 (H) 4.8 - 5.6 %    Comment: (NOTE)          Pre-diabetes: 5.7 - 6.4         Diabetes: >6.4         Glycemic control for adults with diabetes: <7.0    Mean Plasma Glucose 280 mg/dL    Comment: (NOTE) Performed At: [facility] [address] [place], [place] [id number] Mila Homer MD BM:[id number]   Urinalysis, Routine w reflex microscopic     Status: Abnormal   Collection Time: 06/06/16  3:15 PM  Result Value Ref Range   Color, Urine YELLOW YELLOW   APPearance CLEAR CLEAR   Specific Gravity, Urine 1.027 1.005 - 1.030   pH 7.0 5.0 - 8.0   Glucose, UA >1000 (A) NEGATIVE mg/dL   Hgb urine dipstick SMALL (A) NEGATIVE   Bilirubin Urine NEGATIVE NEGATIVE   Ketones, ur NEGATIVE NEGATIVE mg/dL   Protein, ur >442 (A) NEGATIVE mg/dL   Nitrite NEGATIVE NEGATIVE   Leukocytes, UA NEGATIVE NEGATIVE  Urine microscopic-add on     Status: Abnormal   Collection Time: 06/06/16  3:15 PM  Result Value Ref Range   Squamous Epithelial / LPF 0-5 (A) NONE SEEN   WBC, UA 0-5 0 - 5 WBC/hpf   RBC / HPF 0-5 0 - 5 RBC/hpf   Bacteria, UA FEW (A) NONE SEEN  Protime-INR     Status: None   Collection Time: 06/06/16  5:43 PM  Result Value Ref Range   Prothrombin Time 14.0 11.4 - 15.2 seconds   INR 1.08   APTT  Status: None   Collection Time: 06/06/16  5:43 PM  Result Value Ref Range   aPTT 28 24 - 36 seconds  CBG monitoring, ED     Status: Abnormal   Collection Time: 06/06/16  6:06 PM  Result Value Ref Range   Glucose-Capillary 540 (HH) 65 - 99 mg/dL  Urine rapid drug screen (hosp performed)     Status: Abnormal   Collection Time: 06/06/16  6:32 PM  Result Value Ref Range   Opiates NONE DETECTED NONE DETECTED   Cocaine POSITIVE (A) NONE DETECTED   Benzodiazepines NONE DETECTED NONE DETECTED   Amphetamines NONE DETECTED NONE DETECTED   Tetrahydrocannabinol NONE DETECTED NONE DETECTED   Barbiturates NONE DETECTED NONE DETECTED    Comment:        DRUG SCREEN FOR MEDICAL PURPOSES ONLY.  IF CONFIRMATION IS NEEDED FOR ANY  PURPOSE, NOTIFY LAB WITHIN 5 DAYS.        LOWEST DETECTABLE LIMITS FOR URINE DRUG SCREEN Drug Class       Cutoff (ng/mL) Amphetamine      1000 Barbiturate      200 Benzodiazepine   856 Tricyclics       314 Opiates          300 Cocaine          300 THC              50   I-stat troponin, ED     Status: None   Collection Time: 06/06/16  7:11 PM  Result Value Ref Range   Troponin i, poc 0.01 0.00 - 0.08 ng/mL   Comment 3            Comment: Due to the release kinetics of cTnI, a negative result within the first hours of the onset of symptoms does not rule out myocardial infarction with certainty. If myocardial infarction is still suspected, repeat the test at appropriate intervals.   CBG monitoring, ED     Status: Abnormal   Collection Time: 06/06/16  9:28 PM  Result Value Ref Range   Glucose-Capillary 480 (H) 65 - 99 mg/dL  CBG monitoring, ED     Status: Abnormal   Collection Time: 06/06/16 11:51 PM  Result Value Ref Range   Glucose-Capillary 199 (H) 65 - 99 mg/dL  MRSA PCR Screening     Status: None   Collection Time: 06/07/16  1:16 AM  Result Value Ref Range   MRSA by PCR NEGATIVE NEGATIVE    Comment:        The GeneXpert MRSA Assay (FDA approved for NASAL specimens only), is one component of a comprehensive MRSA colonization surveillance program. It is not intended to diagnose MRSA infection nor to guide or monitor treatment for MRSA infections.   Glucose, capillary     Status: Abnormal   Collection Time: 06/07/16  4:46 AM  Result Value Ref Range   Glucose-Capillary 244 (H) 65 - 99 mg/dL  Glucose, capillary     Status: Abnormal   Collection Time: 06/07/16  8:36 AM  Result Value Ref Range   Glucose-Capillary 123 (H) 65 - 99 mg/dL  Basic metabolic panel     Status: Abnormal   Collection Time: 06/07/16 10:53 AM  Result Value Ref Range   Sodium 146 (H) 135 - 145 mmol/L   Potassium 2.8 (L) 3.5 - 5.1 mmol/L   Chloride 114 (H) 101 - 111 mmol/L   CO2 21 (L) 22 -  32 mmol/L   Glucose, Bld 167 (H)  65 - 99 mg/dL   BUN 8 6 - 20 mg/dL   Creatinine, Ser 1.38 (H) 0.44 - 1.00 mg/dL   Calcium 8.4 (L) 8.9 - 10.3 mg/dL   GFR calc non Af Amer 41 (L) >60 mL/min   GFR calc Af Amer 47 (L) >60 mL/min    Comment: (NOTE) The eGFR has been calculated using the CKD EPI equation. This calculation has not been validated in all clinical situations. eGFR's persistently <60 mL/min signify possible Chronic Kidney Disease.    Anion gap 11 5 - 15  Glucose, capillary     Status: Abnormal   Collection Time: 06/07/16 12:25 PM  Result Value Ref Range   Glucose-Capillary 267 (H) 65 - 99 mg/dL  Magnesium     Status: Abnormal   Collection Time: 06/07/16  1:52 PM  Result Value Ref Range   Magnesium 1.5 (L) 1.7 - 2.4 mg/dL  Glucose, capillary     Status: Abnormal   Collection Time: 06/07/16  3:43 PM  Result Value Ref Range   Glucose-Capillary 241 (H) 65 - 99 mg/dL  Glucose, capillary     Status: Abnormal   Collection Time: 06/07/16  7:59 PM  Result Value Ref Range   Glucose-Capillary 401 (H) 65 - 99 mg/dL  Glucose, capillary     Status: None   Collection Time: 06/07/16 11:47 PM  Result Value Ref Range   Glucose-Capillary 70 65 - 99 mg/dL  Basic metabolic panel     Status: Abnormal   Collection Time: 06/08/16  2:41 AM  Result Value Ref Range   Sodium 143 135 - 145 mmol/L   Potassium 2.9 (L) 3.5 - 5.1 mmol/L   Chloride 113 (H) 101 - 111 mmol/L   CO2 19 (L) 22 - 32 mmol/L   Glucose, Bld 277 (H) 65 - 99 mg/dL   BUN 10 6 - 20 mg/dL   Creatinine, Ser 1.37 (H) 0.44 - 1.00 mg/dL   Calcium 8.2 (L) 8.9 - 10.3 mg/dL   GFR calc non Af Amer 41 (L) >60 mL/min   GFR calc Af Amer 48 (L) >60 mL/min    Comment: (NOTE) The eGFR has been calculated using the CKD EPI equation. This calculation has not been validated in all clinical situations. eGFR's persistently <60 mL/min signify possible Chronic Kidney Disease.    Anion gap 11 5 - 15  Glucose, capillary     Status: Abnormal    Collection Time: 06/08/16  3:42 AM  Result Value Ref Range   Glucose-Capillary 313 (H) 65 - 99 mg/dL  Glucose, capillary     Status: Abnormal   Collection Time: 06/08/16  9:16 AM  Result Value Ref Range   Glucose-Capillary 151 (H) 65 - 99 mg/dL   Ct Head Wo Contrast  Result Date: 06/06/2016 CLINICAL DATA:  Confusion with left arm weakness and aphasia EXAM: CT HEAD WITHOUT CONTRAST TECHNIQUE: Contiguous axial images were obtained from the base of the skull through the vertex without intravenous contrast. COMPARISON:  None. FINDINGS: Brain: There is mild diffuse atrophy. There is focal hemorrhage arising in the left pons which extends slightly to the right of midline in the lower pons region. There is mild surrounding edema in this area. This hemorrhage at its maximum measures 1.3 x 1.1 cm. No other hemorrhage is seen. There is no well-defined mass. There is no extra-axial fluid collection or midline shift. There is mild small vessel disease in the centra semiovale bilaterally. Vascular: There is no hyperdense vessel. There is calcification  in each carotid siphon region. Skull: The bony calvarium appears intact. Sinuses/Orbits: There is opacification of a right ethmoid air cell. Visualized paranasal sinuses elsewhere clear. There is rightward deviation of the nasal septum. Orbits appear symmetric bilaterally. Other: Visualized mastoid air cells are clear. IMPRESSION: Acute hemorrhage arising from the left pons and extending across the midline to the medial right lower pons near in the inferior pontine region. Suspect hemorrhagic infarct, most likely secondary to hypertension. There is mild atrophy with mild periventricular small vessel disease. No other hemorrhage evident. There are foci of arterial vascular calcification in the carotid siphon regions. There is right ethmoid sinus disease. There is a deviated nasal septum. Critical Value/emergent results were called by telephone at the time of  interpretation on 06/06/2016 at 3:58 pm to [facility], who verbally acknowledged these results. Electronically Signed   By: Lowella Grip III M.D.   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 Initial encounter. EXAM: MRI HEAD WITHOUT CONTRAST MRA HEAD WITHOUT CONTRAST TECHNIQUE: Multiplanar, multiecho pulse sequences of the brain and surrounding structures were obtained without intravenous contrast. Angiographic images of the head were obtained using MRA technique without contrast. COMPARISON:  Head CT without contrast 06/06/2016. FINDINGS: MRI HEAD FINDINGS Brain: Intra-axial hemorrhage centered in the left pons with intrinsic T1 hyperintensity and mixed T2 signal (subacute, methemoglobin) encompassing 9 x 14 x 19 mm (estimated blood volume 1 mL). Mild surrounding edema. Minimal mass effect. No extension to the adjacent brainstem or cerebellum. Associated susceptibility artifact on diffusion weighted imaging, with no larger area of brainstem restricted diffusion. There is a small nearby chronic lacunar infarct in the right pons (series 10, image 10). There is a small chronic lacunar infarct in the left thalamus (series 10, image 14). No other parenchymal blood products are identified. No cortical encephalomalacia, and otherwise gray and white matter signal is normal. No restricted diffusion elsewhere. No midline shift, mass effect, evidence of mass lesion, ventriculomegaly, or extra-axial collection. Cervicomedullary junction and pituitary are within normal limits. Vascular: Major intracranial vascular flow voids are preserved, with mild intracranial artery tortuosity. Skull and upper cervical spine: Disc bulging and endplate degeneration including bulky anterior endplate osteophytes at C3-C4. Visualized bone marrow signal is within normal  limits. Sinuses/Orbits: Negative orbits soft tissues. Mild right ethmoid and left sphenoid sinus mucosal thickening. Other: Visible internal auditory structures appear normal. Mastoids are clear. Negative scalp soft tissues. 2 cm soft tissue nodule in the inferior left parotid space (series 11, image 22). This appears fairly isointense to the gland on coronal T2 imaging (series 13, image 13). The lesion is conspicuous on diffusion. Normal left stylomastoid foramen. MRA HEAD FINDINGS Antegrade flow in the posterior circulation with dominant distal right vertebral artery. Distal vertebral tortuosity without stenosis. Normal left PICA origin. Mildly tortuous but otherwise normal basilar artery without irregularity or stenosis. Normal SCA and PCA origins. The left posterior communicating artery is present, the right is diminutive or absent. Normal bilateral PCA branches. No abnormal vessels demonstrated about the brainstem hemorrhage. Antegrade flow in both ICA siphons. Bilateral cavernous segment tortuosity and irregularity, greater on the left. No hemodynamically significant siphon stenosis. 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 The left ophthalmic artery origin is normal. The left posterior communicating artery origin is  normal. Normal carotid termini. Mild irregularity at the left MCA origin without significant stenosis. Normal ACA and right MCA origins. Tortuous A1 segments. Anterior communicating artery and visualized ACA branches are within normal limits MCA M1 segments are moderately tortuous. MCA bifurcations and visualized MCA branches are within normal limits. IMPRESSION: 1. 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 Blood volume estimated at 1 mL. Mild surrounding edema without significant mass effect. Burtis Junes this is small vessel disease related with a nearby chronic right pontine lacunar infarct, and a  chronic left thalamic lacunar infarct. 2. 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 MRA also reveals mild generalized intracranial artery tortuosity, and left greater than right ICA siphon atherosclerosis. 3. Two cm round soft tissue mass in the inferior left parotid space, with T2 signal nearly isointense to the gland suggesting a well differentiated Parotid Neoplasm. Recommend ENT follow-up. Electronically Signed   By: Genevie Ann M.D.   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 Initial encounter. EXAM: MRI HEAD WITHOUT CONTRAST MRA HEAD WITHOUT CONTRAST TECHNIQUE: Multiplanar, multiecho pulse sequences of the brain and surrounding structures were obtained without intravenous contrast. Angiographic images of the head were obtained using MRA technique without contrast. COMPARISON:  Head CT without contrast 06/06/2016. FINDINGS: MRI HEAD FINDINGS Brain: Intra-axial hemorrhage centered in the left pons with intrinsic T1 hyperintensity and mixed T2 signal (subacute, methemoglobin) encompassing 9 x 14 x 19 mm (estimated blood volume 1 mL). Mild surrounding edema. Minimal mass effect. No extension to the adjacent brainstem or cerebellum. Associated susceptibility artifact on diffusion weighted imaging, with no larger area of brainstem restricted diffusion. There is a small nearby chronic lacunar infarct in the right pons (series 10, image 10). There is a small chronic lacunar infarct in the left thalamus (series 10, image 14). No other parenchymal blood products are identified. No cortical encephalomalacia, and otherwise gray and white matter signal is normal. No restricted diffusion elsewhere. No midline shift, mass effect, evidence of mass lesion,  ventriculomegaly, or extra-axial collection. Cervicomedullary junction and pituitary are within normal limits. Vascular: Major intracranial vascular flow voids are preserved, with mild intracranial artery tortuosity. Skull and upper cervical spine: Disc bulging and endplate degeneration including bulky anterior endplate osteophytes at C3-C4. Visualized bone marrow signal is within normal limits. Sinuses/Orbits: Negative orbits soft tissues. Mild right ethmoid and left sphenoid sinus mucosal thickening. Other: Visible internal auditory structures appear normal. Mastoids are clear. Negative scalp soft tissues. 2 cm soft tissue nodule in the inferior left parotid space (series 11, image 22). This appears fairly isointense to the gland on coronal T2 imaging (series 13, image 13). The lesion is conspicuous on diffusion. Normal left stylomastoid foramen. MRA HEAD FINDINGS Antegrade flow in the posterior circulation with dominant distal right vertebral artery. Distal vertebral tortuosity without stenosis. Normal left PICA origin. Mildly tortuous but otherwise normal basilar artery without irregularity or stenosis. Normal SCA and PCA origins. The left posterior communicating artery is present, the right is diminutive or absent. Normal bilateral PCA branches. No abnormal vessels demonstrated about the brainstem hemorrhage. Antegrade flow in both ICA siphons. Bilateral cavernous segment tortuosity and irregularity, greater on the left. No hemodynamically significant siphon stenosis. 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 The left ophthalmic artery origin is normal. The left posterior communicating artery origin is normal. Normal carotid termini. Mild irregularity at the left MCA origin without significant stenosis. Normal ACA and right MCA origins. Tortuous A1 segments. Anterior communicating artery and visualized ACA branches are within  normal limits MCA M1 segments are moderately tortuous. MCA bifurcations and visualized MCA branches are within normal limits. IMPRESSION: 1. 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 Blood volume estimated at 1 mL. Mild surrounding edema without significant mass effect. Burtis Junes this is small vessel disease related with a nearby chronic right pontine lacunar infarct, and a chronic left thalamic lacunar infarct. 2. 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 MRA also reveals mild generalized intracranial artery tortuosity, and left greater than right ICA siphon atherosclerosis. 3. Two cm round soft tissue mass in the inferior left parotid space, with T2 signal nearly isointense to the gland suggesting a well differentiated Parotid Neoplasm. Recommend ENT follow-up. Electronically Signed   By: Genevie Ann M.D.   On: 06/07/2016 17:25   Dg Chest Portable 1 View  Result Date: 06/06/2016 CLINICAL DATA:  Left hand tremors, left-sided facial tremor, difficulty ambulating EXAM: PORTABLE CHEST 1 VIEW COMPARISON:  Chest x-ray of 05/16/2016 FINDINGS: No pneumonia or effusion is seen. Mediastinal and hilar contours are unremarkable. The heart is within upper limits of normal. No acute bony abnormality is seen. There are degenerative changes in the shoulders present. IMPRESSION: No active lung disease.  Degenerative change in both shoulders. Electronically Signed   By: Ivar Drape M.D.   On: 06/06/2016 17:15   Dg Swallowing Func-speech Pathology  Result Date: 06/07/2016 Objective Swallowing Evaluation: Type of Study: MBS-Modified Barium Swallow Study Patient Details Name: [name] MRN: [id number] Date of Birth: 04-05-1956 Today's Date: 06/07/2016 Time: SLP Start Time (ACUTE ONLY): 1143-SLP Stop Time (ACUTE ONLY): 1203 SLP Time Calculation (min) (ACUTE ONLY): 20 min Past Medical History: Past  Medical History: Diagnosis Date . Diabetes mellitus   Hb A1C = 12.6 on 05/15/11, managed on Novolog 70/30, 35 U qam, 25 U qpm . Hypertension   poorly controlled . Shortness of breath  Past Surgical History: Past Surgical History: Procedure Laterality Date . ANKLE FRACTURE SURGERY  2007 HPI: 36-yo woman with history of DM, poorly controlled,HTN, SOB, currently uses crack cocaine admitted with tremors/contractions of the L hand and face, trouble walking with weakness of the L hand. Pt had seizure while in CT scanner. CT showed a pontine hemorrhage. Per MD notes sh has been having trouble swallowing and gets choked by both solids and liquids. CXR No active lung disease. Degenerative change in both shoulders. No Data Recorded Assessment / Plan / Recommendation CHL IP CLINICAL IMPRESSIONS 06/07/2016 Therapy Diagnosis Mild oral phase dysphagia;Moderate oral phase dysphagia;Mild pharyngeal phase dysphagia;Moderate pharyngeal phase dysphagia Clinical Impression Decreased lingual manipulation with prolonged mastication and transit with solid texture. Mild-mod sensory based pharyngeal dysphagia exacerbated by what appears to be large bony spurs on C4-5. Laryngeal penetration before and during swallow with thin and nectar not improved with chin tuck posture. Penetrates were transient and remained in vestibule (minimal) without awareness. Honey thick did not yield significant residue during study but may increase if pt lethargic. Recommending nectar thick followed by cough/hard throat clear, Dys 2 texture, pills whole in applesauce, no straws and full supervision initially. ST will continue to follow.  Impact on safety and function Moderate aspiration risk   CHL IP TREATMENT RECOMMENDATION 06/07/2016 Treatment Recommendations Therapy as outlined in treatment plan below   Prognosis 06/07/2016 Prognosis for Safe Diet Advancement Good Barriers to Reach Goals -- Barriers/Prognosis Comment -- CHL IP DIET RECOMMENDATION 06/07/2016  SLP Diet Recommendations Dysphagia 2 (Fine chop) solids;Nectar thick liquid Liquid Administration via Cup;No straw Medication Administration Whole meds with puree Compensations Slow rate;Small sips/bites Postural Changes Remain semi-upright after after feeds/meals (Comment)   CHL IP OTHER RECOMMENDATIONS 06/07/2016 Recommended Consults -- Oral Care Recommendations Oral care BID Other Recommendations --   CHL IP FOLLOW UP RECOMMENDATIONS 06/07/2016 Follow up Recommendations Inpatient Rehab   CHL IP FREQUENCY AND DURATION 06/07/2016 Speech Therapy Frequency (ACUTE ONLY) min 2x/week Treatment Duration 2 weeks      CHL IP ORAL PHASE 06/07/2016 Oral Phase Impaired Oral - Pudding Teaspoon -- Oral - Pudding Cup -- Oral - Honey Teaspoon -- Oral - Honey Cup WFL Oral - Nectar Teaspoon -- Oral - Nectar Cup WFL Oral - Nectar Straw -- Oral - Thin Teaspoon -- Oral - Thin Cup WFL Oral - Thin Straw -- Oral - Puree -- Oral - Mech Soft -- Oral - Regular Delayed oral transit Oral - Multi-Consistency -- Oral - Pill -- Oral Phase - Comment --  CHL IP PHARYNGEAL PHASE 06/07/2016 Pharyngeal Phase Impaired Pharyngeal- Pudding Teaspoon -- Pharyngeal -- Pharyngeal- Pudding Cup -- Pharyngeal -- Pharyngeal- Honey Teaspoon -- Pharyngeal -- Pharyngeal- Honey Cup -- Pharyngeal -- Pharyngeal- Nectar Teaspoon -- Pharyngeal -- Pharyngeal- Nectar Cup Delayed swallow initiation-pyriform sinuses;Penetration/Aspiration during swallow Pharyngeal Material enters airway, remains ABOVE vocal cords then ejected out;Material enters airway, remains ABOVE vocal cords and not ejected out Pharyngeal- Nectar Straw -- Pharyngeal -- Pharyngeal- Thin Teaspoon -- Pharyngeal -- Pharyngeal- Thin Cup Delayed swallow initiation-pyriform sinuses;Penetration/Aspiration before swallow;Penetration/Aspiration during swallow Pharyngeal Material enters airway, remains ABOVE vocal cords then ejected out;Material enters airway, remains ABOVE vocal cords and not ejected out  Pharyngeal- Thin Straw -- Pharyngeal -- Pharyngeal- Puree -- Pharyngeal -- Pharyngeal- Mechanical Soft -- Pharyngeal -- Pharyngeal- Regular WFL Pharyngeal -- Pharyngeal- Multi-consistency -- Pharyngeal -- Pharyngeal- Pill -- Pharyngeal -- Pharyngeal Comment --  CHL IP CERVICAL ESOPHAGEAL PHASE 06/07/2016 Cervical Esophageal Phase WFL Pudding Teaspoon -- Pudding Cup -- Honey Teaspoon -- Honey Cup -- Nectar Teaspoon -- Nectar Cup -- Nectar Straw -- Thin Teaspoon -- Thin Cup -- Thin Straw -- Puree -- Mechanical Soft -- Regular -- Multi-consistency -- Pill -- Cervical Esophageal Comment -- No flowsheet data found. [name] 06/07/2016, 1:53 PM  [name].Ed CCC-SLP Pager [phone]                 Medical Problem List and Plan: 1.  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  DVT Prophylaxis/Anticoagulation: SCDs. Monitor for any signs of DVT 3. Pain Management/chronic back pain: Ultram 50 mg daily as needed as well as Flexeril. Patient had been on Neurontin 600 mg 4 times a day, hydrocodone as needed prior to admission. 4. Dysphagia. Dysphagia #2 nectar thick liquids. Monitor hydration. Follow-up speech therapy 5. Neuropsych: This patient is capable of making decisions on her own behalf. 6. Skin/Wound Care: Routine skin checks 7. Fluids/Electrolytes/Nutrition: Routine I&O with follow-up chemistries 8. Diabetes mellitus with peripheral neuropathy. Hemoglobin A1c 11.4. Lantus insulin 7 units daily at bedtime. Check blood sugars before meals and at bedtime. Diabetic teaching 9. Seizure prophylaxis. Keppra 1000  mg every 12 hours. EEG negative 10. Hypertension. Lisinopril 20 mg daily, HCTZ 12.5 mg daily. Monitor with increased mobility 11. Hyperlipidemia. Patient on Zocor 10 mg daily at bedtime prior to admission. 12. Tobacco/cocaine abuse. Nicoderm patch. Provide counseling  13.  Hypokalemia. Potassium supplement and follow-up chemistries 14. Question acute on chronic renal insufficiency. Baseline creatinine 1.74 on admission his creatinine 1.38. Follow-up chemistries as appropriate.   Post Admission Physician Evaluation: 1. Functional deficits secondary  to left pontine hemorrhage. 2. Patient is admitted to receive collaborative, interdisciplinary care between the physiatrist, rehab nursing staff, and therapy team. 3. Patient's level of medical complexity and substantial therapy needs in context of that medical necessity cannot be provided at a lesser intensity of care such as a SNF. 4. Patient has experienced substantial functional loss from his/her baseline which was documented above under the "Functional History" and "Functional Status" headings.  Judging by the patient's diagnosis, physical exam, and functional history, the patient has potential for functional progress which will result in measurable gains while on inpatient rehab.  These gains will be of substantial and practical use upon discharge  in facilitating mobility and self-care at the household level. 5. Physiatrist will provide 24 hour management of medical needs as well as oversight of the therapy plan/treatment and provide guidance as appropriate regarding the interaction of the two. 6. The Preadmission Screening has been reviewed and patient status is unchanged unless otherwise stated above. 7. 24 hour rehab nursing will assist with bladder management, bowel management, safety, skin/wound care, disease management, medication administration, pain management and patient education  and help integrate therapy concepts, techniques,education, etc. 8. PT will assess and treat for/with: Lower extremity strength, range of motion, stamina, balance, functional mobility, safety, adaptive techniques and equipment, NMR, community reintegration.   Goals are: mod I. 9. OT will assess and treat for/with: ADL's, functional  mobility, safety, upper extremity strength, adaptive techniques and equipment, NMR, community reintegration, pt education.   Goals are: mod I. Therapy may proceed with showering this patient. 10. SLP will assess and treat for/with: speech, communication, swalowing.  Goals are: mod I. 11. Case Management and Social Worker will assess and treat for psychological issues and discharge planning. 12. Team conference will be held weekly to assess progress toward goals and to determine barriers to discharge. 13. Patient will receive at least 3 hours of therapy per day at least 5 days per week. 14. ELOS: 14-18 days       15. Prognosis:  excellent     Meredith Staggers, MD, [facility] 06/09/2016  06/08/2016

## 2016-06-08 NOTE — Progress Notes (Signed)
Report given to [facility] on 40M. Patient's nephew updated of transfer. Damaria Stofko, Rande Brunt, RN

## 2016-06-08 NOTE — Progress Notes (Signed)
Patient arrived to unit. Seizure precautions set up at bedside. Bed alarm on, will continue to monitor. Call bell within patient's reach

## 2016-06-08 NOTE — Progress Notes (Signed)
Rehab admissions - I am following for potential acute inpatient rehab admission once patient is medically ready.  Noted workup is ongoing with orders to tele unit.  I will follow progress.  Call me for questions.  RC:[phone]

## 2016-06-09 ENCOUNTER — Inpatient Hospital Stay (HOSPITAL_COMMUNITY)
Admission: RE | Admit: 2016-06-09 | Discharge: 2016-06-20 | DRG: 057 | Disposition: A | Discharge status: Home Health | Payer: Medicaid Other | Source: Intra-hospital | Attending: Physical Medicine & Rehabilitation | Admitting: Physical Medicine & Rehabilitation

## 2016-06-09 ENCOUNTER — Inpatient Hospital Stay (HOSPITAL_COMMUNITY): Payer: Medicaid Other

## 2016-06-09 ENCOUNTER — Encounter (HOSPITAL_COMMUNITY): Payer: Self-pay | Admitting: *Deleted

## 2016-06-09 DIAGNOSIS — I69151 Hemiplegia and hemiparesis following nontraumatic intracerebral hemorrhage affecting right dominant side: Principal | ICD-10-CM

## 2016-06-09 DIAGNOSIS — E875 Hyperkalemia: Secondary | ICD-10-CM | POA: Diagnosis not present

## 2016-06-09 DIAGNOSIS — E785 Hyperlipidemia, unspecified: Secondary | ICD-10-CM

## 2016-06-09 DIAGNOSIS — F17210 Nicotine dependence, cigarettes, uncomplicated: Secondary | ICD-10-CM

## 2016-06-09 DIAGNOSIS — J449 Chronic obstructive pulmonary disease, unspecified: Secondary | ICD-10-CM | POA: Diagnosis not present

## 2016-06-09 DIAGNOSIS — R040 Epistaxis: Secondary | ICD-10-CM

## 2016-06-09 DIAGNOSIS — E11649 Type 2 diabetes mellitus with hypoglycemia without coma: Secondary | ICD-10-CM

## 2016-06-09 DIAGNOSIS — R1310 Dysphagia, unspecified: Secondary | ICD-10-CM | POA: Diagnosis not present

## 2016-06-09 DIAGNOSIS — F1410 Cocaine abuse, uncomplicated: Secondary | ICD-10-CM

## 2016-06-09 DIAGNOSIS — E1142 Type 2 diabetes mellitus with diabetic polyneuropathy: Secondary | ICD-10-CM | POA: Diagnosis not present

## 2016-06-09 DIAGNOSIS — I6789 Other cerebrovascular disease: Secondary | ICD-10-CM

## 2016-06-09 DIAGNOSIS — I69191 Dysphagia following nontraumatic intracerebral hemorrhage: Secondary | ICD-10-CM | POA: Diagnosis not present

## 2016-06-09 DIAGNOSIS — I69122 Dysarthria following nontraumatic intracerebral hemorrhage: Secondary | ICD-10-CM

## 2016-06-09 DIAGNOSIS — G936 Cerebral edema: Secondary | ICD-10-CM

## 2016-06-09 DIAGNOSIS — Z79899 Other long term (current) drug therapy: Secondary | ICD-10-CM | POA: Diagnosis not present

## 2016-06-09 DIAGNOSIS — M549 Dorsalgia, unspecified: Secondary | ICD-10-CM | POA: Diagnosis not present

## 2016-06-09 DIAGNOSIS — G8929 Other chronic pain: Secondary | ICD-10-CM

## 2016-06-09 DIAGNOSIS — G4700 Insomnia, unspecified: Secondary | ICD-10-CM | POA: Diagnosis not present

## 2016-06-09 DIAGNOSIS — I613 Nontraumatic intracerebral hemorrhage in brain stem: Secondary | ICD-10-CM

## 2016-06-09 DIAGNOSIS — E876 Hypokalemia: Secondary | ICD-10-CM | POA: Diagnosis not present

## 2016-06-09 DIAGNOSIS — R1312 Dysphagia, oropharyngeal phase: Secondary | ICD-10-CM | POA: Diagnosis not present

## 2016-06-09 DIAGNOSIS — E1122 Type 2 diabetes mellitus with diabetic chronic kidney disease: Secondary | ICD-10-CM | POA: Diagnosis not present

## 2016-06-09 DIAGNOSIS — F4323 Adjustment disorder with mixed anxiety and depressed mood: Secondary | ICD-10-CM | POA: Diagnosis not present

## 2016-06-09 DIAGNOSIS — I129 Hypertensive chronic kidney disease with stage 1 through stage 4 chronic kidney disease, or unspecified chronic kidney disease: Secondary | ICD-10-CM

## 2016-06-09 DIAGNOSIS — N189 Chronic kidney disease, unspecified: Secondary | ICD-10-CM | POA: Diagnosis not present

## 2016-06-09 DIAGNOSIS — G9340 Encephalopathy, unspecified: Secondary | ICD-10-CM | POA: Diagnosis present

## 2016-06-09 DIAGNOSIS — I69192 Facial weakness following nontraumatic intracerebral hemorrhage: Secondary | ICD-10-CM

## 2016-06-09 DIAGNOSIS — Z794 Long term (current) use of insulin: Secondary | ICD-10-CM

## 2016-06-09 DIAGNOSIS — N289 Disorder of kidney and ureter, unspecified: Secondary | ICD-10-CM

## 2016-06-09 DIAGNOSIS — I10 Essential (primary) hypertension: Secondary | ICD-10-CM | POA: Diagnosis not present

## 2016-06-09 DIAGNOSIS — R509 Fever, unspecified: Secondary | ICD-10-CM

## 2016-06-09 DIAGNOSIS — E1165 Type 2 diabetes mellitus with hyperglycemia: Secondary | ICD-10-CM

## 2016-06-09 DIAGNOSIS — G8191 Hemiplegia, unspecified affecting right dominant side: Secondary | ICD-10-CM | POA: Diagnosis not present

## 2016-06-09 DIAGNOSIS — I610 Nontraumatic intracerebral hemorrhage in hemisphere, subcortical: Secondary | ICD-10-CM | POA: Diagnosis not present

## 2016-06-09 LAB — GLUCOSE, CAPILLARY
GLUCOSE-CAPILLARY: 83 mg/dL (ref 65–99)
GLUCOSE-CAPILLARY: 264 mg/dL — AB (ref 65–99)

## 2016-06-09 LAB — ECHOCARDIOGRAM COMPLETE
Height: 63 in
Weight: 3294.55 oz

## 2016-06-09 MED ORDER — LISINOPRIL 20 MG PO TABS
20.0000 mg | ORAL_TABLET | Freq: Every day | ORAL | Status: DC
  Administered 2016-06-10 – 2016-06-19 (×10): 20 mg via ORAL
  Filled 2016-06-09 (×11): qty 1

## 2016-06-09 MED ORDER — ACETAMINOPHEN 650 MG RE SUPP: 650.0000 mg | RECTAL | Status: DC | PRN

## 2016-06-09 MED ORDER — INSULIN ASPART PROT & ASPART (70-30 MIX) 100 UNIT/ML ~~LOC~~ SUSP
40.0000 [IU] | Freq: Two times a day (BID) | SUBCUTANEOUS | Status: DC
  Administered 2016-06-10 – 2016-06-12 (×6): 40 [IU] via SUBCUTANEOUS

## 2016-06-09 MED ORDER — POTASSIUM CHLORIDE CRYS ER 20 MEQ PO TBCR
20.0000 meq | EXTENDED_RELEASE_TABLET | Freq: Two times a day (BID) | ORAL | Status: DC
  Administered 2016-06-09 – 2016-06-10 (×3): 20 meq via ORAL
  Filled 2016-06-09 (×3): qty 1

## 2016-06-09 MED ORDER — TRAMADOL HCL 50 MG PO TABS
50.0000 mg | ORAL_TABLET | Freq: Every day | ORAL | Status: DC | PRN
  Administered 2016-06-11 – 2016-06-19 (×3): 50 mg via ORAL
  Filled 2016-06-09 (×5): qty 1

## 2016-06-09 MED ORDER — PANTOPRAZOLE SODIUM 40 MG PO TBEC
40.0000 mg | DELAYED_RELEASE_TABLET | Freq: Every day | ORAL | Status: DC
  Administered 2016-06-09 – 2016-06-13 (×5): 40 mg via ORAL
  Filled 2016-06-09 (×5): qty 1

## 2016-06-09 MED ORDER — TRAZODONE HCL 50 MG PO TABS
100.0000 mg | ORAL_TABLET | Freq: Every day | ORAL | Status: DC
  Administered 2016-06-09 – 2016-06-19 (×11): 100 mg via ORAL
  Filled 2016-06-09 (×11): qty 2

## 2016-06-09 MED ORDER — LEVETIRACETAM 500 MG PO TABS
1000.0000 mg | ORAL_TABLET | Freq: Two times a day (BID) | ORAL | Status: DC
  Administered 2016-06-10 – 2016-06-20 (×20): 1000 mg via ORAL
  Filled 2016-06-09 (×21): qty 2

## 2016-06-09 MED ORDER — SIMVASTATIN 5 MG PO TABS
10.0000 mg | ORAL_TABLET | Freq: Every day | ORAL | Status: DC
  Administered 2016-06-09 – 2016-06-19 (×11): 10 mg via ORAL
  Filled 2016-06-09 (×2): qty 1
  Filled 2016-06-09: qty 2
  Filled 2016-06-09 (×4): qty 1
  Filled 2016-06-09: qty 2
  Filled 2016-06-09: qty 1
  Filled 2016-06-09: qty 2
  Filled 2016-06-09: qty 1

## 2016-06-09 MED ORDER — SORBITOL 70 % SOLN
30.0000 mL | Freq: Every day | Status: DC | PRN
  Administered 2016-06-11: 30 mL via ORAL
  Filled 2016-06-09: qty 30

## 2016-06-09 MED ORDER — ADULT MULTIVITAMIN W/MINERALS CH
1.0000 | ORAL_TABLET | Freq: Every day | ORAL | Status: DC
  Administered 2016-06-10 – 2016-06-20 (×11): 1 via ORAL
  Filled 2016-06-09 (×11): qty 1

## 2016-06-09 MED ORDER — SODIUM CHLORIDE 0.45 % IV SOLN
INTRAVENOUS | Status: DC
  Administered 2016-06-09: 21:00:00 via INTRAVENOUS

## 2016-06-09 MED ORDER — PNEUMOCOCCAL VAC POLYVALENT 25 MCG/0.5ML IJ INJ
0.5000 mL | INJECTION | INTRAMUSCULAR | Status: AC
  Administered 2016-06-10: 0.5 mL via INTRAMUSCULAR
  Filled 2016-06-09: qty 0.5

## 2016-06-09 MED ORDER — NICOTINE 14 MG/24HR TD PT24
14.0000 mg | MEDICATED_PATCH | Freq: Every day | TRANSDERMAL | Status: DC
  Administered 2016-06-10 – 2016-06-20 (×11): 14 mg via TRANSDERMAL
  Filled 2016-06-09 (×11): qty 1

## 2016-06-09 MED ORDER — RESOURCE THICKENUP CLEAR PO POWD
ORAL | Status: DC | PRN
  Filled 2016-06-09: qty 125

## 2016-06-09 MED ORDER — VITAMIN B-1 100 MG PO TABS
100.0000 mg | ORAL_TABLET | Freq: Every day | ORAL | Status: DC
  Administered 2016-06-10 – 2016-06-20 (×11): 100 mg via ORAL
  Filled 2016-06-09 (×11): qty 1

## 2016-06-09 MED ORDER — INSULIN ASPART 100 UNIT/ML ~~LOC~~ SOLN
0.0000 [IU] | Freq: Three times a day (TID) | SUBCUTANEOUS | Status: DC
  Administered 2016-06-10: 15 [IU] via SUBCUTANEOUS
  Administered 2016-06-10: 3 [IU] via SUBCUTANEOUS
  Administered 2016-06-10 – 2016-06-11 (×2): 11 [IU] via SUBCUTANEOUS
  Administered 2016-06-11: 4 [IU] via SUBCUTANEOUS
  Administered 2016-06-11: 7 [IU] via SUBCUTANEOUS
  Administered 2016-06-12 (×2): 3 [IU] via SUBCUTANEOUS
  Administered 2016-06-12: 4 [IU] via SUBCUTANEOUS
  Administered 2016-06-13 (×2): 11 [IU] via SUBCUTANEOUS
  Administered 2016-06-14: 20 [IU] via SUBCUTANEOUS
  Administered 2016-06-14: 15 [IU] via SUBCUTANEOUS
  Administered 2016-06-15 (×2): 3 [IU] via SUBCUTANEOUS
  Administered 2016-06-16: 4 [IU] via SUBCUTANEOUS
  Administered 2016-06-16: 15 [IU] via SUBCUTANEOUS
  Administered 2016-06-17 (×2): 7 [IU] via SUBCUTANEOUS
  Administered 2016-06-18: 3 [IU] via SUBCUTANEOUS
  Administered 2016-06-18: 4 [IU] via SUBCUTANEOUS
  Administered 2016-06-18: 7 [IU] via SUBCUTANEOUS
  Administered 2016-06-19 (×2): 4 [IU] via SUBCUTANEOUS

## 2016-06-09 MED ORDER — ACETAMINOPHEN 325 MG PO TABS: 650.0000 mg | ORAL_TABLET | ORAL | Status: DC | PRN

## 2016-06-09 MED ORDER — AMLODIPINE BESYLATE 10 MG PO TABS
10.0000 mg | ORAL_TABLET | Freq: Every day | ORAL | Status: DC
  Administered 2016-06-10 – 2016-06-20 (×11): 10 mg via ORAL
  Filled 2016-06-09 (×11): qty 1

## 2016-06-09 MED ORDER — FOLIC ACID 1 MG PO TABS
1.0000 mg | ORAL_TABLET | Freq: Every day | ORAL | Status: DC
  Administered 2016-06-10 – 2016-06-20 (×11): 1 mg via ORAL
  Filled 2016-06-09 (×11): qty 1

## 2016-06-09 MED ORDER — GABAPENTIN 300 MG PO CAPS
600.0000 mg | ORAL_CAPSULE | Freq: Four times a day (QID) | ORAL | Status: DC
  Administered 2016-06-09 – 2016-06-20 (×41): 600 mg via ORAL
  Filled 2016-06-09 (×42): qty 2

## 2016-06-09 MED ORDER — STARCH (THICKENING) PO POWD
ORAL | Status: DC | PRN
  Filled 2016-06-09 (×2): qty 227

## 2016-06-09 MED ORDER — ENSURE ENLIVE PO LIQD
237.0000 mL | Freq: Two times a day (BID) | ORAL | Status: DC
  Administered 2016-06-10: 237 mL via ORAL

## 2016-06-09 MED ORDER — HYDROCHLOROTHIAZIDE 12.5 MG PO CAPS
12.5000 mg | ORAL_CAPSULE | Freq: Every day | ORAL | Status: DC
  Administered 2016-06-10 – 2016-06-12 (×3): 12.5 mg via ORAL
  Filled 2016-06-09 (×3): qty 1

## 2016-06-09 MED ORDER — POTASSIUM CHLORIDE 20 MEQ PO PACK
20.0000 meq | PACK | Freq: Three times a day (TID) | ORAL | Status: DC
  Filled 2016-06-09 (×2): qty 1

## 2016-06-09 MED ORDER — CYCLOBENZAPRINE HCL 10 MG PO TABS
10.0000 mg | ORAL_TABLET | Freq: Two times a day (BID) | ORAL | Status: DC | PRN
  Administered 2016-06-11 – 2016-06-12 (×2): 10 mg via ORAL
  Filled 2016-06-09 (×2): qty 1

## 2016-06-09 MED ORDER — ACYCLOVIR 400 MG PO TABS
400.0000 mg | ORAL_TABLET | Freq: Four times a day (QID) | ORAL | Status: DC
  Administered 2016-06-09 – 2016-06-20 (×42): 400 mg via ORAL
  Filled 2016-06-09 (×47): qty 1

## 2016-06-09 NOTE — Progress Notes (Addendum)
Speech Language Pathology Treatment: Dysphagia  Patient Details Name: [name] MRN: [id number] DOB: [birth year] Today's Date: 06/09/2016 Time: [id number] SLP Time Calculation (min) (ACUTE ONLY): 16 min  Assessment / Plan / Recommendation Clinical Impression  Dysphagia treatment provided to check diet tolerance/ consider advancement. Pt showed no immediate overt s/s of aspiration on nectar-thick liquids. Following trials of small sips of thin liquid, the pt had wet vocal quality and a delayed, productive cough. Pt expressed frustration with nectar-thick liquids and feeling thirsty. Pt expressed having swallowing difficulty PTA but feels it has improved over the past couple of days. Given that laryngeal penetration was silent on previous MBS and s/s at bedside, recommend repeating MBS next date in order to objectively evaluate swallow function to check for improvement/ consider benefit of NTL vs thin as pt had laryngeal penetration of both consistencies previously. Until repeat MBS, recommend continuing current diet of dysphagia 2/ nectar thick liquids, meds whole in puree, full supervision to cue pt to have small bites/ sips and cough every 2-3 sips. Also allow small amount of ice chips PRN at pt request for comfort (3-4 at a time).    HPI HPI: 54-yo woman with history of DM, poorly controlled,HTN, SOB, currently uses crack cocaine admitted with tremors/contractions of the L hand and face, trouble walking with weakness of the L hand. Pt had seizure while in CT scanner. CT showed a pontine hemorrhage. Per MD notes sh has been having trouble swallowing and gets choked by both solids and liquids. CXR No active lung disease. Degenerative change in both shoulders.      SLP Plan  MBS     Recommendations  Diet recommendations: Dysphagia 2 (fine chop);Nectar-thick liquid Liquids provided via: Cup;No straw Medication Administration: Whole meds with puree Supervision: Patient able to self feed;Full  supervision/cueing for compensatory strategies Compensations: Slow rate;Small sips/bites Postural Changes and/or Swallow Maneuvers: Seated upright 90 degrees                Oral Care Recommendations: Oral care BID Follow up Recommendations: Inpatient Rehab Plan: [place], Amy K, MA, CCC-SLP 06/09/2016, 4:27 PM [phone]

## 2016-06-09 NOTE — Discharge Summary (Signed)
Stroke Discharge Summary  Patient ID: [name]   MRN: [id number]      DOB: 28-Nov-1955  Date of Admission: 06/06/2016 Date of Discharge: 06/09/2016  Attending Physician:  Garvin Fila, MD, Stroke MD Consulting Physician(s):   rehabilitation medicine Patient's PCP:  Default, Provider, MD  Discharge Diagnoses:  Brainstem hemorrhage centered in the left pons. Etiology indeterminate -hypertensive versus cavernoma  Active Problems:   ICH (intracerebral hemorrhage) (HCC)   Cytotoxic brain edema (HCC) BMI  Body mass index is 36.48 kg/m.  Past Medical History:  Diagnosis Date  . Diabetes mellitus    Hb A1C = 12.6 on 05/15/11, managed on Novolog 70/30, 35 U qam, 25 U qpm  . Hypertension    poorly controlled  . Shortness of breath    Past Surgical History:  Procedure Laterality Date  . ANKLE FRACTURE SURGERY  2007    Medications to be continued on Rehab . acyclovir  400 mg Oral QID  . amLODipine  10 mg Oral Daily  . chlorhexidine  15 mL Mouth Rinse BID  . folic acid  1 mg Oral Daily  . gabapentin  600 mg Oral QID  . lisinopril  20 mg Oral Daily   And  . hydrochlorothiazide  12.5 mg Oral Daily  . insulin aspart  0-20 Units Subcutaneous TID WC  . insulin aspart protamine- aspart  40 Units Subcutaneous BID  . levETIRAcetam  1,000 mg Oral Q12H  . LORazepam  0-4 mg Intravenous Q12H  . LORazepam  1 mg Intravenous Once  . mouth rinse  15 mL Mouth Rinse q12n4p  . multivitamin with minerals  1 tablet Oral Daily  . nicotine  14 mg Transdermal Daily  . pantoprazole  40 mg Oral QHS  . potassium chloride  20 mEq Oral TID  . simvastatin  10 mg Oral QHS  . thiamine  100 mg Oral Daily  . traZODone  100 mg Oral QHS    LABORATORY STUDIES CBC    Component Value Date/Time   WBC 10.8 (H) 06/06/2016 1508   RBC 4.99 06/06/2016 1508   HGB 16.1 (H) 06/06/2016 1508   HCT 46.7 (H) 06/06/2016 1508   PLT 315 06/06/2016 1508   MCV 93.6 06/06/2016 1508   MCH 32.3 06/06/2016 1508    MCHC 34.5 06/06/2016 1508   RDW 14.1 06/06/2016 1508   LYMPHSABS 2.5 06/06/2016 1508   MONOABS 0.6 06/06/2016 1508   EOSABS 0.1 06/06/2016 1508   BASOSABS 0.0 06/06/2016 1508   CMP    Component Value Date/Time   NA 143 06/08/2016 0241   K 2.9 (L) 06/08/2016 0241   CL 113 (H) 06/08/2016 0241   CO2 19 (L) 06/08/2016 0241   GLUCOSE 277 (H) 06/08/2016 0241   BUN 10 06/08/2016 0241   CREATININE 1.37 (H) 06/08/2016 0241   CALCIUM 8.2 (L) 06/08/2016 0241   PROT 7.7 06/06/2016 1508   ALBUMIN 2.5 (L) 06/06/2016 1508   AST 17 06/06/2016 1508   ALT 14 06/06/2016 1508   ALKPHOS 54 06/06/2016 1508   BILITOT 0.4 06/06/2016 1508   GFRNONAA 41 (L) 06/08/2016 0241   GFRAA 48 (L) 06/08/2016 0241   COAGS Lab Results  Component Value Date   INR 1.08 06/06/2016   Lipid Panel    Component Value Date/Time   CHOL 157 06/06/2016 1508   TRIG 214 (H) 06/06/2016 1508   HDL 33 (L) 06/06/2016 1508   CHOLHDL 4.8 06/06/2016 1508   VLDL 43 (H) 06/06/2016  [place] 06/06/2016 1508   HgbA1C  Lab Results  Component Value Date   HGBA1C 11.4 (H) 06/06/2016   Cardiac Panel (last 3 results) No results for input(s): CKTOTAL, CKMB, TROPONINI, RELINDX in the last 72 hours. Urinalysis    Component Value Date/Time   COLORURINE YELLOW 06/06/2016 1515   APPEARANCEUR CLEAR 06/06/2016 1515   LABSPEC 1.027 06/06/2016 1515   PHURINE 7.0 06/06/2016 1515   GLUCOSEU >1000 (A) 06/06/2016 1515   GLUCOSEU > 1000 mg/dL (A) 11/01/2007 2056   HGBUR SMALL (A) 06/06/2016 1515   HGBUR negative 07/15/2007 0954   BILIRUBINUR NEGATIVE 06/06/2016 1515   KETONESUR NEGATIVE 06/06/2016 1515   PROTEINUR >300 (A) 06/06/2016 1515   UROBILINOGEN 1.0 04/09/2012 1524   NITRITE NEGATIVE 06/06/2016 1515   LEUKOCYTESUR NEGATIVE 06/06/2016 1515   Urine Drug Screen     Component Value Date/Time   LABOPIA NONE DETECTED 06/06/2016 1832   COCAINSCRNUR POSITIVE (A) 06/06/2016 1832   COCAINSCRNUR (A) 03/19/2008 2343     POSITIVE (NOTE) Sent for confirmatory testing Result repeated and verified.   LABBENZ NONE DETECTED 06/06/2016 1832   LABBENZ (A) 03/19/2008 2343    POSITIVE (NOTE) Sent for confirmatory testing Result repeated and verified.   AMPHETMU NONE DETECTED 06/06/2016 1832   THCU NONE DETECTED 06/06/2016 1832   LABBARB NONE DETECTED 06/06/2016 1832    Alcohol Level    Component Value Date/Time   [facility]  10/14/2007 2242    <5        LOWEST DETECTABLE LIMIT FOR SERUM ALCOHOL IS 11 mg/dL FOR MEDICAL PURPOSES ONLY     SIGNIFICANT DIAGNOSTIC STUDIES  Ct Head Wo Contrast 06/06/2016 Acute hemorrhage arising from the left pons and extending across the midline to the medial right lower pons near in the inferior pontine region. Suspect hemorrhagic infarct, most likely secondary to hypertension. There is mild atrophy with mild periventricular small vessel disease. No other hemorrhage evident. There are foci of arterial vascular calcification in the carotid siphon regions. There is right ethmoid sinus disease. There is a deviated nasal septum.     Dg Chest Portable 1 View 06/06/2016 No active lung disease.  Degenerative change in both shoulders.    Mr [name] Head Wo Contrast 06/07/2016 1. 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 Blood volume estimated at 1 mL. Mild surrounding edema without significant mass effect. Burtis Junes this is small vessel disease related with a nearby chronic right pontine lacunar infarct, and a chronic left thalamic lacunar infarct.  2. 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 MRA also reveals mild generalized intracranial artery tortuosity, and left greater than right ICA siphon atherosclerosis.  3. Two cm round soft tissue mass in the inferior left parotid space, with T2 signal nearly isointense to the gland suggesting a well differentiated Parotid  Neoplasm. Recommend ENT follow-up.    Transesophageal echocardiogram 06/09/2016 Study Conclusions  - Left ventricle: The cavity size was normal. Wall thickness was   increased in a pattern of mild LVH. Systolic function was normal.   The estimated ejection fraction was in the range of 60% to 65%.   Doppler parameters are consistent with abnormal left ventricular   relaxation (grade 1 diastolic dysfunction). - Mitral valve: Calcified annulus. - Atrial septum: No defect or patent foramen ovale was identified   HISTORY OF PRESENT ILLNESS This is a 20-yo RH woman who was brought to the  [facility] ED by EMS. History was obtained from the patient who is a fair historian. Additional information is obtained from review of the patient's medical record.   Per notes, EMS was called to the patient's home because she was having tremors and contractions of the L hand and face. She reported to them that she was having trouble walking with weakness of the L hand since last night. Per ED MD note, EMS reported that the patient was alert and oriented and able to walk for them. ED PA note indicates that the caregiver reported progressive difficult walking with slurred speech for the past week. She developed weakness and tremors in the left hand 3-4 days ago. In the ED, she was initially alert and oriented and able to get off the gurney to go to the bathroom. However, after returning from the bathroom she had a sudden decrease in level of arousal, unable to follow commands and repeating her birthday anytime someone asked her a question. There was concern for ICH and she was sent for a STAT CT of the head. While in the scanner she reportedly had another seizure and was given Ativan. CTH showed a pontine hemorrhage. She has been hypertensive in the ED with SBP 151-202. She has been placed on a nicardipine drip and received a Keppra load of 1750 mg.  Her mentation has steadily improved after her seizure. On further  assessment, she was alert and able to answer questions. She reported that she had been having trouble speaking and using her left hand for 3-4 weeks. In addition, she had trouble walking and reported a fall at home two days Prior to admission that occurred because she was unable to control her leg. She had a non-specific headache with significant nausea, no vomiting. She had been having trouble swallowing and got choked by both solids and liquids. She had blurred vision but denied double vision or frank vision loss. She stated that symptoms got worse on the day of admission and this prompted her to call 911.   She denied any prior history of stroke or hemorrhage. She has no prior history of seizure. She admited to using crack cocaine every other day and last used the day prior to admission.  HOSPITAL COURSE ASSESSMENT/PLAN Ms. [name] is a 60 y.o. female with history of DM II, HTN, cocaine abuse, medication noncomlpiance presenting with slurred speech, R arm weakness and numbness. She did not receive IV t-PA due to hemorrhagic stroke. She was admitted to the neuro intensive care unit.   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  Resultant  Slurred speech, arm weakness, numbness  MRI  Left pontine subacute hematoma  MRA  No large vessel stenosis  Carotid Doppler - pending  2D Echo   EF 60-65%. See report above.  LDL 81  HgbA1c 12.6  SCD or VTE prophylaxis  DIET DYS 2 Room service appropriate? Yes; Fluid consistency: Nectar Thick  No antithrombotic prior to admission, now on No antithrombotic  Ongoing aggressive stroke risk factor management  Therapy recommendations:   inpatient rehabilitation recommended  Disposition:   discharged to the [facility] today  Patient had a hemorrhagic infarct of left pons likely few days ago possibly due to her uncontrolled HTN and cocaine abuse.     Hypertension  Stable. Off cardene gtt.   Goal <180.  Hyperlipidemia  Home meds:  None  LDL 81, goal < 70  Consider adding  Lipitor 40mg  daily  Uncontrolled Diabetes with Hyperglycemia >600 Hyperglycemia improved without requiring insulin gtt. Now on   HgbA1c 12.6 goal < 7.0  Has medication noncompliance with her home meds. Will add Lantus 7 units nightly and continue SSI.  Other Stroke Risk Factors  Advanced age  Cigarette and cocaine abuse - advised cessation.   ETOH use  Obesity, Body mass index is 36.48 kg/m., recommend weight loss, diet and exercise as appropriate   DM II and HTN  Other Active Problems  Hypokalemia - repleting.   Low-grade fever with mild leukocytosis  Substance abuse  Hypertension   Medical noncompliance  Renal insufficiency  Two cm round soft tissue mass in the inferior left parotid space, with T2 signal nearly isointense to the gland suggesting a well differentiated Parotid Neoplasm. Recommend ENT follow-up.    DISCHARGE EXAM Blood pressure (!) 189/93, pulse 85, temperature 99.3 F (37.4 C), temperature source Oral, resp. rate 17, height 5\' 3"  (1.6 m), weight 93.4 kg (205 lb 14.6 oz), SpO2 96 %. General: obese female, awake, talking. A&ox4.  HEENT: [place]/at.  CV: rrr, no m/r/g Neurological Exam :  : Awake and alert oriented 3 with normal speech and language function. Pupils equal reactive. Fundi were not visualized. full visual field. Mild right lower facial asymmetry when she smiles. Sensation intact b/l on face and exts. 5/5 str bilaterally upper and lower ext. except mild weakness of right grip and intrinsic hand muscles. Orbits left over right upper extremity. Speech is normal. 2+ DTR. Normal finger to nose. Gait not tested  Discharge Diet  DIET DYS 2 Room service appropriate? Yes; Fluid consistency: Nectar Thick liquids  DISCHARGE PLAN  Disposition:  Transfer to [facility] for ongoing PT, OT and  ST  No antithrombotic for secondary stroke prevention secondary to hemorrhage.  Recommend ongoing risk factor control by Primary Care Physician at time of discharge from inpatient rehabilitation.  Follow-up Default, Provider, MD in 2 weeks following discharge from rehab.  Follow-up with Dr. Antony Contras, Stroke Clinic in 6 weeks, office to schedule an appointment.   35 minutes were spent preparing discharge.  Mikey Bussing PA-C [facility] Pager [phone] 06/09/2016, 3:55 PM I have personally examined this patient, reviewed notes, independently viewed imaging studies, participated in medical decision making and plan of care.ROS completed by me personally and pertinent positives fully documented  I have made any additions or clarifications directly to the above note. Agree with note above.   Antony Contras, MD Medical Director [facility] Pager: [phone] 06/09/2016 4:03 PM

## 2016-06-09 NOTE — H&P (Signed)
Physical Medicine and Rehabilitation Admission H&P      Chief Complaint  Patient presents with  . Altered Mental Status    pt last Wed. began to lose ability in her L hand and was unable to ambulate without assist as per her caregiver since than pt's L hand has began to have tremors with facial involvement that strted last night   :  HPI: [name] is a 60 y.o. right handed female with history of poorly controlled diabetes mellitus, hypertension,Chronic back pain, tobacco cocaine abuse. Per chart review patient lives with significant other and independent prior to admission. 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 Presented 06/06/2016 with tremors and reported weakness of the left hand and facial droop as well as slurred speech over the past 3-4 days with increasing lethargy as well as a recent fall 2 days prior to admission. SBP 151-202. Urine drug screen positive for cocaine. Troponin negative.Mildly elevated creatinine 1.7. 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 While in the CT scan reported seizure and was given Ativan. EEG showed no seizure activity. Placed on nicardipine drip for blood pressure control. Loaded with Keppra for seizure prophylaxis. Follow-up MRI/MRA showed stable hemorrhage, no related findings on intracranial MRA.Echocardiogram and carotid Dopplers are pending. Hemoglobin A1c 11.4 with insulin therapy as directed. Currently on a dysphagia #2 nectar thick liquid diet.Bouts of hypokalemia 2.9 with supplement added. Physical and occupational therapy evaluation completed 06/07/2016 with recommendations of physical medicine rehabilitation consult.Patient was admitted for a comprehensive rehabilitation program  Review of Systems  Constitutional: Negative for chills and fever.  HENT: Negative for hearing loss.  Eyes:  Negative for blurred vision and double vision.  Respiratory: Positive for cough and shortness of breath.  Cardiovascular: Negative for chest pain, palpitations and leg swelling.  Gastrointestinal: Positive for constipation and heartburn. Negative for melena, nausea and vomiting.  Genitourinary: Negative for dysuria, flank pain and urgency.  Musculoskeletal: Positive for myalgias. Negative for falls.  Skin: Negative for rash.  Neurological: Positive for dizziness, tremors, sensory change and headaches. Negative for seizures and loss of consciousness.  Psychiatric/Behavioral: The patient has insomnia.  All other systems reviewed and are negative.       Past Medical History:  Diagnosis Date  . Diabetes mellitus    Hb A1C = 12.6 on 05/15/11, managed on Novolog 70/30, 35 U qam, 25 U qpm  . Hypertension    poorly controlled  . Shortness of breath         Past Surgical History:  Procedure Laterality Date  . ANKLE FRACTURE SURGERY  2007   No family history on file.  Social History: reports that she has been smoking. She has a 12.50 pack-year smoking history. She has never used smokeless tobacco. She reports that she drinks about 7.2 oz of alcohol per week . She reports that she uses drugs, including Cocaine.  Allergies: No Known Allergies        Medications Prior to Admission  Medication Sig Dispense Refill  . acyclovir (ZOVIRAX) 400 MG tablet Take 1 tablet (400 mg total) by mouth 4 (four) times daily. 50 tablet 0  . amLODipine (NORVASC) 10 MG tablet Take 10 mg by mouth daily.    . cyclobenzaprine (FLEXERIL) 10 MG tablet Take 1 tablet (10 mg total) by mouth 2 (two) times daily as needed for  muscle spasms. 10 tablet 0  . furosemide (LASIX) 20 MG tablet Take 20 mg by mouth daily as needed (for swollen legs).   0  . gabapentin (NEURONTIN) 300 MG capsule Take 600 mg by mouth 4 (four) times daily.    Marland Kitchen HYDROcodone-acetaminophen (NORCO/VICODIN) 5-325 MG per tablet Take 2 tablets by mouth every 6  (six) hours as needed for pain. 15 tablet 0  . ibuprofen (ADVIL,MOTRIN) 400 MG tablet Take 2 tablets (800 mg total) by mouth every 6 (six) hours as needed. For pain (Patient taking differently: Take 800 mg by mouth every 6 (six) hours as needed (for pain). ) 30 tablet 0  . lisinopril (PRINIVIL,ZESTRIL) 10 MG tablet Take 2 tablets (20 mg total) by mouth daily. (Patient not taking: Reported on 06/06/2016) 60 tablet 0  . lisinopril-hydrochlorothiazide (PRINZIDE,ZESTORETIC) 20-12.5 MG tablet Take 1 tablet by mouth daily.  0  . NOVOLOG MIX 70/30 FLEXPEN (70-30) 100 UNIT/ML FlexPen Inject 40 Units into the skin 2 (two) times daily. MORNING AND BEDTIME  0  . simvastatin (ZOCOR) 10 MG tablet Take 10 mg by mouth at bedtime.  0  . traMADol (ULTRAM) 50 MG tablet Take 50 mg by mouth daily as needed (for pain).    . traZODone (DESYREL) 100 MG tablet Take 100 mg by mouth at bedtime.  0  . [DISCONTINUED] lisinopril (PRINIVIL,ZESTRIL) 20 MG tablet Take 1 tablet (20 mg total) by mouth daily. (Patient not taking: Reported on 06/06/2016) 30 tablet 1   Home:  [place] expects to be discharged to:: Private residence  Living Arrangements: Spouse/significant other  Available Help at Discharge: Family  Type of Home: House  Home Access: [place]: One level  Bathroom Shower/Tub: Tourist information centre manager: [place]: None (all DME is for boyfriend )  Lives With: Significant other  Functional History:  Prior Function  Level of Independence: Independent  Comments: helping take care of boyfriend that she has to dress and bath. [name] ( boyfriend) RN comes Monday - Friday to help his caregiver  Functional Status:  Mobility:  Bed Mobility  Overal bed mobility: Needs Assistance  Bed Mobility: Supine to Sit  Supine to sit: Mod assist  General bed mobility comments: (A) to elevate trunk from bed surface. bil LE (A) to eob.  Transfers  Overall transfer level: Needs  assistance  Equipment used: Rolling walker (2 wheeled)  Transfers: Sit to/from Stand  Sit to Stand: Mod assist  Stand pivot transfers: Min assist  General transfer comment: Assist to power to standing with pt pulling up on RW. Trembling in BLEs upon standing. SPT to chair with Min A for balance due to bil knee instability.    ADL:  ADL  Overall ADL's : Needs assistance/impaired  Eating/Feeding: Supervision/ safety  Eating/Feeding Details (indicate cue type and reason): repeatly asking for juice or ice  Grooming: Wash/dry face, Wash/dry hands, Set up  Toilet Transfer: Moderate assistance  Toileting- Clothing Manipulation and Hygiene: Minimal assistance  Toileting - Clothing Manipulation Details (indicate cue type and reason): (A) for steady. standing without (A) requires bil Ue to power off 3n1  General ADL Comments: pt declined 3n1 with therapy but requesting it with RN arrival at end of session. Ot (A)ing with transfer  Cognition:  Cognition  Overall Cognitive Status: Impaired/Different from baseline  Arousal/Alertness: Awake/alert  Orientation Level: Oriented X4  Attention: Sustained  Sustained Attention: Appears intact  Memory: Appears intact (for 4 words)  Awareness: Impaired  Awareness Impairment: Anticipatory impairment, Emergent impairment  Problem Solving: Impaired  Problem Solving Impairment: Verbal basic  Safety/Judgment: (questionable)  Cognition  Arousal/Alertness: Awake/alert  Behavior During Therapy: WFL for tasks assessed/performed  Overall Cognitive Status: Impaired/Different from baseline  Area of Impairment: Awareness, Safety/judgement  Memory: Decreased short-term memory  Safety/Judgement: Decreased awareness of safety, Decreased awareness of deficits  Awareness: Emergent  General Comments: Pt requires cues for safety. pt repeating same questions in session even after being provided an answer  Physical Exam:  Blood pressure (!) 172/97, pulse (!) 107,  temperature 98.1 F (36.7 C), temperature source Oral, resp. rate (!) 23, height 5\' 3"  (1.6 m), weight 93.4 kg (205 lb 14.6 oz), SpO2 100 %.  Physical Exam  Constitutional: She appears well-developed.  Eyes: EOM are normal.  Pupils round and reactive to light  Neck: Normal range of motion. Neck supple. No thyromegaly present.  Cardiovascular: Normal rate, regular rhythm and normal heart sounds.  Respiratory: Effort normal and breath sounds normal. No respiratory distress.  GI: Soft. Bowel sounds are normal. She exhibits no distension.  Neurological: She is alert.  Alert. Speech is dysarthric but intelligible. Reasonable insight and awareness. Follows all commands. . Right central 7. Marland KitchenRUE 3-4/5 bicep, tricep, wrist, hand. RLE 4/5 prox to distal. LUE 5/5. LLE 5/5. No sensory findings  Skin: Skin is warm and dry.  Psychiatric: normal mood  Lab Results Last 48 Hours  Imaging Results (Last 48 hours)     Medical Problem List and Plan:  1. 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 DVT Prophylaxis/Anticoagulation: SCDs. Monitor for any signs of DVT  3. Pain Management/chronic back pain: Ultram 50 mg daily as needed as well as Flexeril. Patient had been on Neurontin 600 mg 4 times a day, hydrocodone as needed prior to admission.  4. Dysphagia. Dysphagia #2 nectar thick liquids. Monitor hydration. Follow-up speech therapy  5. Neuropsych: This patient is capable of making decisions on her own behalf.  6. Skin/Wound Care: Routine skin checks  7. Fluids/Electrolytes/Nutrition: Routine I&O with follow-up chemistries  8. Diabetes mellitus with peripheral neuropathy. Hemoglobin A1c 11.4. Lantus insulin 7 units daily at bedtime. Check blood sugars before meals and at bedtime. Diabetic teaching  9. Seizure prophylaxis. Keppra 1000 mg every 12 hours. EEG negative  10. Hypertension. Lisinopril 20 mg daily, HCTZ 12.5 mg daily. Monitor with increased mobility  11. Hyperlipidemia. Patient on Zocor 10 mg daily at bedtime prior to admission.  12. Tobacco/cocaine abuse. Nicoderm patch. Provide counseling  13. Hypokalemia. Potassium supplement and follow-up chemistries  14. Question acute on chronic renal insufficiency. Baseline creatinine 1.74 on admission his creatinine 1.38. Follow-up chemistries as appropriate.  Post Admission Physician Evaluation:  1. 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 2. Patient is admitted to receive collaborative, interdisciplinary care between the physiatrist, rehab nursing staff, and therapy team. 3. Patient's level of medical complexity and substantial  therapy needs in context of that medical necessity cannot be provided at a lesser intensity of care such as a SNF. 4. Patient has experienced substantial functional loss from his/her baseline which was documented above under the "Functional History" and "Functional Status" headings. Judging by the patient's diagnosis, physical exam, and functional history, the patient has potential for functional progress which will result in measurable gains while on inpatient rehab. These gains will be of substantial and practical use upon discharge in facilitating mobility and self-care at the household level. 5. Physiatrist will provide 24 hour management of medical needs as well as oversight of the therapy plan/treatment and provide guidance as appropriate regarding the interaction of the two. 6. The Preadmission Screening has been reviewed and patient status is unchanged unless otherwise stated above. 7. 24 hour rehab nursing will assist with bladder management, bowel management, safety, skin/wound care, disease management, medication administration, pain management and patient education and help integrate therapy concepts, techniques,education, etc. 8. PT will assess and treat for/with: Lower extremity strength, range of motion, stamina, balance, functional mobility, safety, adaptive techniques and equipment, NMR, community reintegration. Goals are: mod I. 9. OT will assess and treat for/with: ADL's, functional mobility, safety, upper extremity strength, adaptive techniques and equipment, NMR, community reintegration, pt education. Goals are: mod I. Therapy may proceed with showering this patient. 10. SLP will assess and treat for/with: speech, communication, swalowing. Goals are: mod I. 11. Case Management and Social Worker will assess and treat for psychological issues and discharge planning. 12. Team conference will be held weekly to assess progress toward goals and to determine barriers to discharge. 13. Patient  will receive at least 3 hours of therapy per day at least 5 days per week. 14. ELOS: 14-18 days  15. Prognosis: excellent Meredith Staggers, MD, [place] [facility]  06/09/2016

## 2016-06-09 NOTE — PMR Pre-admission (Signed)
PMR Admission Coordinator Pre-Admission Assessment  Patient: [name] is an 60 y.o., female MRN: [id number] DOB: 03/23/1956 Height: 5\' 3"  (160 cm) Weight: 93.4 kg (205 lb 14.6 oz)              Insurance Information HMO: No   PPO:       PCP:       IPA:       80/20:       OTHER:   PRIMARY:  Medicaid [place] Access      Policy#: [id number]      Subscriber: Wynn Banker CM Name:        Phone#:       Fax#:   Pre-Cert#:        Employer:  Disabled/Not employed Benefits:  Phone #: [phone]     Name:  Automated Eff. Date: Eligible 06/08/16 with coverage code [id number]     Deduct:        Out of Pocket Max:        Life Max:   CIR:        SNF:   Outpatient:       Co-Pay:   Home Health:        Co-Pay:   DME:       Co-Pay:   Providers:    Emergency Contact Information Contact Information    Name Relation Home Work Mobile   [place] Friend [phone]     [place] Significant other   [phone]     Current Medical History  Patient Admitting Diagnosis: Left pontine hemorrhage with right hemiparesis    History of Present Illness: A 59 y.o. right handed female with history of poorly controlled diabetes mellitus, hypertension, tobacco and cocaine abuse. Per chart review patient lives with significant other and independent prior to admission. 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 Presented 06/06/2016 with tremors and reported weakness of the left hand and facial droop as well as slurred speech over the past 3-4 days with increasing lethargy as well as a recent fall 2 days prior to admission. SBP 151-202. Urine drug screen positive for cocaine. Troponin negative. 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 While in the CT scan reported seizure and was given Ativan. EEG showed no seizure activity. Placed on nicardipine   drip for blood pressure control. Loaded with Keppra for seizure prophylaxis. Follow-up MRI/MRA showed stable hemorrhage, no related findings on intracranial MRA. Hemoglobin A1c 11.4 with insulin therapy as directed. Currently on a dysphagia #2 nectar thick liquid diet. Physical and occupational therapy evaluation completed 06/07/2016 with recommendations of physical medicine rehabilitation consult.   Total: 4=NIH  Past Medical History  Past Medical History:  Diagnosis Date  . Diabetes mellitus    Hb A1C = 12.6 on 05/15/11, managed on Novolog 70/30, 35 U qam, 25 U qpm  . Hypertension    poorly controlled  . Shortness of breath     Family History  family history is not on file.  Prior Rehab/Hospitalizations: No previous rehab.  Has the patient had major surgery during 100 days prior to admission? No  Current Medications   Current Facility-Administered Medications:  .  0.9 %  sodium chloride infusion, , Intravenous, Continuous, Darrel Reach, MD, Last Rate: 100 mL/hr at 06/08/16 1531 .  acetaminophen (TYLENOL) tablet 650 mg, 650 mg, Oral, Q4H PRN **  OR** acetaminophen (TYLENOL) suppository 650 mg, 650 mg, Rectal, Q4H PRN, Darrel Reach, MD .  acyclovir (ZOVIRAX) tablet 400 mg, 400 mg, Oral, QID, David L Rinehuls, PA-C, 400 mg at 06/09/16 1240 .  amLODipine (NORVASC) tablet 10 mg, 10 mg, Oral, Daily, David L Rinehuls, PA-C, 10 mg at 06/09/16 1100 .  chlorhexidine (PERIDEX) 0.12 % solution 15 mL, 15 mL, Mouth Rinse, BID, Garvin Fila, MD, 15 mL at 06/09/16 1102 .  cyclobenzaprine (FLEXERIL) tablet 10 mg, 10 mg, Oral, BID PRN, Garvin Fila, MD .  folic acid (FOLVITE) tablet 1 mg, 1 mg, Oral, Daily, Darrel Reach, MD, 1 mg at 06/09/16 1100 .  food thickener (THICK IT) powder, , Oral, PRN, Garvin Fila, MD .  furosemide (LASIX) tablet 20 mg, 20 mg, Oral, Daily PRN, Garvin Fila, MD .  gabapentin (NEURONTIN) capsule 600 mg, 600 mg, Oral, QID, David L Rinehuls, PA-C,  600 mg at 06/09/16 1101 .  lisinopril (PRINIVIL,ZESTRIL) tablet 20 mg, 20 mg, Oral, Daily, 20 mg at 06/09/16 1101 **AND** hydrochlorothiazide (MICROZIDE) capsule 12.5 mg, 12.5 mg, Oral, Daily, Kimberly B Hammons, RPH, 12.5 mg at 06/09/16 1100 .  insulin aspart (novoLOG) injection 0-20 Units, 0-20 Units, Subcutaneous, TID WC, Garvin Fila, MD, 11 Units at 06/09/16 1241 .  insulin aspart protamine- aspart (NOVOLOG MIX 70/30) injection 40 Units, 40 Units, Subcutaneous, BID, Garvin Fila, MD, 40 Units at 06/09/16 1102 .  labetalol (NORMODYNE,TRANDATE) injection 10 mg, 10 mg, Intravenous, Q2H PRN, Wallie Char, 10 mg at 06/09/16 0746 .  levETIRAcetam (KEPPRA) tablet 1,000 mg, 1,000 mg, Oral, Q12H, Garvin Fila, MD, 1,000 mg at 06/09/16 0658 .  [EXPIRED] LORazepam (ATIVAN) injection 0-4 mg, 0-4 mg, Intravenous, Q6H, 2 mg at 06/07/16 1637 **FOLLOWED BY** LORazepam (ATIVAN) injection 0-4 mg, 0-4 mg, Intravenous, Q12H, Darrel Reach, MD .  LORazepam (ATIVAN) injection 1 mg, 1 mg, Intravenous, Once, [place], PA-C .  LORazepam (ATIVAN) tablet 1 mg, 1 mg, Oral, Q6H PRN **OR** LORazepam (ATIVAN) injection 1 mg, 1 mg, Intravenous, Q6H PRN, Darrel Reach, MD .  MEDLINE mouth rinse, 15 mL, Mouth Rinse, q12n4p, Garvin Fila, MD, 15 mL at 06/08/16 1224 .  morphine 4 MG/ML injection 1-4 mg, 1-4 mg, Intravenous, Q1H PRN, Darrel Reach, MD .  multivitamin with minerals tablet 1 tablet, 1 tablet, Oral, Daily, Darrel Reach, MD, 1 tablet at 06/09/16 1100 .  nicotine (NICODERM CQ - dosed in mg/24 hours) patch 14 mg, 14 mg, Transdermal, Daily, Darrel Reach, MD, 14 mg at 06/09/16 1101 .  pantoprazole (PROTONIX) EC tablet 40 mg, 40 mg, Oral, QHS, Garvin Fila, MD, 40 mg at 06/08/16 2204 .  potassium chloride (KLOR-CON) packet 20 mEq, 20 mEq, Oral, TID, David L Rinehuls, PA-C .  RESOURCE THICKENUP CLEAR, , Oral, PRN, Garvin Fila, MD .  simvastatin (ZOCOR) tablet 10 mg, 10 mg,  Oral, QHS, David L Rinehuls, PA-C, 10 mg at 06/08/16 2201 .  thiamine (VITAMIN B-1) tablet 100 mg, 100 mg, Oral, Daily, 100 mg at 06/09/16 1100 **OR** [DISCONTINUED] thiamine (B-1) injection 100 mg, 100 mg, Intravenous, Daily, Darrel Reach, MD, 100 mg at 06/07/16 1303 .  traMADol (ULTRAM) tablet 50 mg, 50 mg, Oral, Daily PRN, Garvin Fila, MD .  traZODone (DESYREL) tablet 100 mg, 100 mg, Oral, QHS, David L Rinehuls, PA-C, 100 mg at 06/08/16 2205  Patients Current Diet: DIET DYS 2 Room service appropriate? Yes; Fluid consistency: Nectar  Thick  Precautions / Restrictions Precautions Precautions: Fall Precaution Comments: watch BP  Restrictions Weight Bearing Restrictions: No   Has the patient had 2 or more falls or a fall with injury in the past year?Yes.  Reports 2 falls with no injury.  Prior Activity Level Household: Went out 1 X a month for groceries and 1-2 X a month to the pharmacy, otherwise homebound.  Home Assistive Devices / Equipment Home Assistive Devices/Equipment: Environmental consultant (specify type), CBG Meter Home Equipment: None (all DME is for boyfriend )  Prior Device Use: Indicate devices/aids used by the patient prior to current illness, exacerbation or injury? None  Prior Functional Level Prior Function Level of Independence: Independent Comments: helping take care of boyfriend that she has to dress and bath. [name] ( boyfriend) RN comes Monday - Friday to help his caregiver  Self Care: Did the patient need help bathing, dressing, using the toilet or eating?  Independent  Indoor Mobility: Did the patient need assistance with walking from room to room (with or without device)? Independent  Stairs: Did the patient need assistance with internal or external stairs (with or without device)? Independent  Functional Cognition: Did the patient need help planning regular tasks such as shopping or remembering to take medications? Independent  Current Functional  Level Cognition  Arousal/Alertness: Awake/alert Overall Cognitive Status: Impaired/Different from baseline Orientation Level: Oriented X4 Safety/Judgement: Decreased awareness of safety, Decreased awareness of deficits General Comments: Requires cues for safety.  Attention: Sustained Sustained Attention: Appears intact Memory: Appears intact (for 4 words) Awareness: Impaired Awareness Impairment: Anticipatory impairment, Emergent impairment Problem Solving: Impaired Problem Solving Impairment: Verbal basic Safety/Judgment:  (questionable)    Extremity Assessment (includes Sensation/Coordination)  Upper Extremity Assessment: RUE deficits/detail, LUE deficits/detail RUE Deficits / Details: numbness compared to R. 4 out 5 MMT. able to hold [place] and place in mouth  Lower Extremity Assessment: Defer to PT evaluation RLE Deficits / Details: Grossly ~3/5 throughout except 2/5 hip flexion. LLE Sensation: decreased light touch    ADLs  Overall ADL's : Needs assistance/impaired Eating/Feeding: Supervision/ safety Eating/Feeding Details (indicate cue type and reason): repeatly asking for juice or ice Grooming: Wash/dry face, Wash/dry hands, Set up Toilet Transfer: Moderate assistance Toileting- Clothing Manipulation and Hygiene: Minimal assistance Toileting - Clothing Manipulation Details (indicate cue type and reason): (A) for steady. standing without (A) requires bil Ue to power off 3n1 General ADL Comments: pt declined 3n1 with therapy but requesting it with RN arrival at end of session. Ot (A)ing with transfer    Mobility  Overal bed mobility: Needs Assistance Bed Mobility: Supine to Sit Supine to sit: Mod assist, HOB elevated General bed mobility comments: (A) to elevate trunk from bed surface.     Transfers  Overall transfer level: Needs assistance Equipment used: Rolling walker (2 wheeled) Transfers: Sit to/from Stand Sit to Stand: Min assist Stand pivot transfers: Min  assist General transfer comment: Assist to power to standing; cues for hand placement/technique. Stood from [employer], from chair x1. Transferred to chair post ambulation bout.    Ambulation / Gait / Stairs / Wheelchair Mobility  Ambulation/Gait Ambulation/Gait assistance: Museum/gallery curator (Feet): 40 Feet (+50') Assistive device: Rolling walker (2 wheeled) Gait Pattern/deviations: Step-through pattern, Step-to pattern, Decreased stride length, Trunk flexed, Shuffle General Gait Details: Slow, unsteady gait with varying speeds. Pt with blurry vision. Fatigues quickly. 1 seated rest break. Gait velocity: decreased Gait velocity interpretation: Below normal speed for age/gender    Posture / Balance Balance Overall balance  assessment: Needs assistance Sitting-balance support: Feet supported, No upper extremity supported Sitting balance-Leahy Scale: Good Standing balance support: During functional activity, Bilateral upper extremity supported Standing balance-Leahy Scale: Poor Standing balance comment: Reliant on BUEs for support in standing.     Special needs/care consideration BiPAP/CPAP No CPM No Continuous Drip IV 0.9% NS 100 mL/hr Dialysis No        Life Vest  No Oxygen No Special Bed No Trach Size No Wound Vac (area) No       Skin No                            Bowel mgmt: Last documented BM 06/07/16 Bladder mgmt: Voiding on bedpan with episodes of incontinence Diabetic mgmt Yes.  In insulin at home.  Hgb A1C 12.6    Previous Home Environment Living Arrangements: Spouse/significant other, Other relatives  Lives With: Significant other Available Help at Discharge: Family Type of Home: House Home Layout: One level Home Access: Building control surveyor Shower/Tub: Multimedia programmer: [place]: No  Discharge Living Setting Plans for Discharge Living Setting: House, Lives with (comment) (Lives with common law husband.) Type of Home at  Discharge: House Discharge Home Layout: One level Discharge Home Access: Stairs to enter, Level entry Entrance Stairs-Number of Steps: Has 3-4 steps at front and level entry at back Does the patient have any problems obtaining your medications?: No  Social/Family/Support Systems Patient Roles: Other (Comment) (Has a boyfriend who she helps care for.) Contact Information: Ishmael Holter - friend - [phone] Anticipated Caregiver: self Ability/Limitations of Caregiver: Common law husband has caregivers M-F 11 am to 1 pm. Caregiver Availability: Intermittent Discharge Plan Discussed with Primary Caregiver: Yes Is Caregiver In Agreement with Plan?: Yes Does Caregiver/Family have Issues with Lodging/Transportation while Pt is in Rehab?: No  Goals/Additional Needs Patient/Family Goal for Rehab: PT/OT/SLP mod I goals Expected length of stay: 8-12 days Cultural Considerations: None Dietary Needs: Dys 2, nectar thick liquids Equipment Needs: TBD Pt/Family Agrees to Admission and willing to participate: Yes Program Orientation Provided & Reviewed with Pt/Caregiver Including Roles  & Responsibilities: Yes  Decrease burden of Care through IP rehab admission: N/A  Possible need for SNF placement upon discharge: Not planned  Patient Condition: This patient's condition remains as documented in the consult dated 06/09/16, in which the Rehabilitation Physician determined and documented that the patient's condition is appropriate for intensive rehabilitative care in an inpatient rehabilitation facility. Will admit to inpatient rehab today.  Preadmission Screen Completed By:  Retta Diones, 06/09/2016 2:11 PM ______________________________________________________________________   Discussed status with Dr. Naaman Plummer on 06/09/16 at 1410 and received telephone approval for admission today.  Admission Coordinator:  Retta Diones, time1410/Date12/01/17

## 2016-06-09 NOTE — Progress Notes (Signed)
Rehab admissions - I met with patient at her bedside.  She tells me she lives with her common law husband, Ok Edwards, who is a FEMALE.  She is the caregiver for HIM.  He also has an aide M-F from 11 am to 1 pm to assist him.  Patient would like inpatient rehab admission.  Will check with attending to be sure all medical workup is completed.  Call me for questions.  #[phone]

## 2016-06-09 NOTE — Progress Notes (Signed)
Patient is being transferred to inpatient rehab.

## 2016-06-09 NOTE — Progress Notes (Signed)
STROKE TEAM PROGRESS NOTE   HISTORY OF PRESENT ILLNESS (per record) This is a 60-yo RH woman who was brought to the [facility] ED by EMS. History is obtained from the patient who is a fair historian. Additional information is obtained from review of the patient's medical record.   Per notes, EMS was called to the patient's home because she was having tremors and contractions of the L hand and face. She reported to them that she was having trouble walking with weakness of the L hand since last night. Per ED MD note, EMS reported that the patient was alert and oriented and able to walk for them. ED PA note indicates that the caregiver reported progressive difficult walking with slurred speech for the past week. She developed weakness and tremors in the left hand 3-4 days ago. In the ED, she was initially alert and oriented and able to get off the gurney to go to the bathroom. However, after returning from the bathroom she had a sudden decrease in level of arousal, unable to follow commands and repeating her birthday anytime someone asked her a question. There was concern for ICH and she was sent for a STAT CT of the head. While in the scanner she reportedly had another seizure and was given Ativan. CTH showed a pontine hemorrhage. She has been hypertensive in the ED with SBP 151-202. She has been placed on a nicardipine drip and received a Keppra load of 1750 mg.  Her mentation has steadily improved after her seizure. On my assessment, she is alert and able to answer questions. She tells me that she has been having trouble speaking and using her left hand for 3-4 weeks. In addition, she has had trouble walking and reports a fall at home two days ago that occurred because she was unable to control her leg. She has a non-specific headache with significant nausea, no vomiting. She has been having trouble swallowing and gets choked by both solids and liquids. She has blurred vision but denies double vision or  frank vision loss. She states that symptoms got worse today and this prompted her to call 911.   She denies any prior history of stroke or hemorrhage. She has no prior history of seizure. She admits to using crack cocaine every other day and last used yesterday.   SUBJECTIVE (INTERVAL HISTORY) Patient is stable. No changes in exam. She is stable for transfer to inpatient rehab.Low grade temp 99.8. CBC, ua and chemisties pending   OBJECTIVE Temp:  [98.2 F (36.8 C)-99.8 F (37.7 C)] 99 F (37.2 C) (12/01 1300) Pulse Rate:  [85-93] 88 (12/01 1300) Cardiac Rhythm: Normal sinus rhythm (12/01 0700) Resp:  [17-20] 18 (12/01 1300) BP: (159-201)/(71-93) 159/87 (12/01 1300) SpO2:  [93 %-97 %] 97 % (12/01 1300)  CBC:   Recent Labs Lab 06/06/16 1508  WBC 10.8*  NEUTROABS 7.7  HGB 16.1*  HCT 46.7*  MCV 93.6  PLT 123456    Basic Metabolic Panel:   Recent Labs Lab 06/07/16 1053 06/07/16 1352 06/08/16 0241  NA 146*  --  143  K 2.8*  --  2.9*  CL 114*  --  113*  CO2 21*  --  19*  GLUCOSE 167*  --  277*  BUN 8  --  10  CREATININE 1.38*  --  1.37*  CALCIUM 8.4*  --  8.2*  MG  --  1.5*  --     Lipid Panel:     Component Value Date/Time  CHOL 157 06/06/2016 1508   TRIG 214 (H) 06/06/2016 1508   HDL 33 (L) 06/06/2016 1508   CHOLHDL 4.8 06/06/2016 1508   VLDL 43 (H) 06/06/2016 1508   LDLCALC 81 06/06/2016 1508   HgbA1c:  Lab Results  Component Value Date   HGBA1C 11.4 (H) 06/06/2016   Urine Drug Screen:     Component Value Date/Time   LABOPIA NONE DETECTED 06/06/2016 1832   COCAINSCRNUR POSITIVE (A) 06/06/2016 1832   COCAINSCRNUR (A) 03/19/2008 2343    POSITIVE (NOTE) Sent for confirmatory testing Result repeated and verified.   LABBENZ NONE DETECTED 06/06/2016 1832   LABBENZ (A) 03/19/2008 2343    POSITIVE (NOTE) Sent for confirmatory testing Result repeated and verified.   AMPHETMU NONE DETECTED 06/06/2016 1832   THCU NONE DETECTED 06/06/2016 1832   LABBARB  NONE DETECTED 06/06/2016 1832      IMAGING  Mr [name] Head Wo Contrast  Result Date: 06/07/2016 CLINICAL DATA:  60 year old female with pontine hemorrhage, presenting with progressive neurologic deficits which began 1 week ago. Initial encounter. EXAM: MRI HEAD WITHOUT CONTRAST MRA HEAD WITHOUT CONTRAST TECHNIQUE: Multiplanar, multiecho pulse sequences of the brain and surrounding structures were obtained without intravenous contrast. Angiographic images of the head were obtained using MRA technique without contrast. COMPARISON:  Head CT without contrast 06/06/2016. FINDINGS: MRI HEAD FINDINGS Brain: Intra-axial hemorrhage centered in the left pons with intrinsic T1 hyperintensity and mixed T2 signal (subacute, methemoglobin) encompassing 9 x 14 x 19 mm (estimated blood volume 1 mL). Mild surrounding edema. Minimal mass effect. No extension to the adjacent brainstem or cerebellum. Associated susceptibility artifact on diffusion weighted imaging, with no larger area of brainstem restricted diffusion. There is a small nearby chronic lacunar infarct in the right pons (series 10, image 10). There is a small chronic lacunar infarct in the left thalamus (series 10, image 14). No other parenchymal blood products are identified. No cortical encephalomalacia, and otherwise gray and white matter signal is normal. No restricted diffusion elsewhere. No midline shift, mass effect, evidence of mass lesion, ventriculomegaly, or extra-axial collection. Cervicomedullary junction and pituitary are within normal limits. Vascular: Major intracranial vascular flow voids are preserved, with mild intracranial artery tortuosity. Skull and upper cervical spine: Disc bulging and endplate degeneration including bulky anterior endplate osteophytes at C3-C4. Visualized bone marrow signal is within normal limits. Sinuses/Orbits: Negative orbits soft tissues. Mild right ethmoid and left sphenoid sinus mucosal thickening. Other: Visible  internal auditory structures appear normal. Mastoids are clear. Negative scalp soft tissues. 2 cm soft tissue nodule in the inferior left parotid space (series 11, image 22). This appears fairly isointense to the gland on coronal T2 imaging (series 13, image 13). The lesion is conspicuous on diffusion. Normal left stylomastoid foramen. MRA HEAD FINDINGS Antegrade flow in the posterior circulation with dominant distal right vertebral artery. Distal vertebral tortuosity without stenosis. Normal left PICA origin. Mildly tortuous but otherwise normal basilar artery without irregularity or stenosis. Normal SCA and PCA origins. The left posterior communicating artery is present, the right is diminutive or absent. Normal bilateral PCA branches. No abnormal vessels demonstrated about the brainstem hemorrhage. Antegrade flow in both ICA siphons. Bilateral cavernous segment tortuosity and irregularity, greater on the left. No hemodynamically significant siphon stenosis. 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 The left ophthalmic artery origin is normal. The left posterior communicating artery origin is normal. Normal carotid termini. Mild irregularity at  the left MCA origin without significant stenosis. Normal ACA and right MCA origins. Tortuous A1 segments. Anterior communicating artery and visualized ACA branches are within normal limits MCA M1 segments are moderately tortuous. MCA bifurcations and visualized MCA branches are within normal limits. IMPRESSION: 1. 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 Blood volume estimated at 1 mL. Mild surrounding edema without significant mass effect. Burtis Junes this is small vessel disease related with a nearby chronic right pontine lacunar infarct, and a chronic left thalamic lacunar infarct. 2. 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 MRA also reveals mild generalized intracranial artery tortuosity, and left greater than right ICA siphon atherosclerosis. 3. Two cm round soft tissue mass in the inferior left parotid space, with T2 signal nearly isointense to the gland suggesting a well differentiated Parotid Neoplasm. Recommend ENT follow-up. Electronically Signed   By: Genevie Ann M.D.   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 Initial encounter. EXAM: MRI HEAD WITHOUT CONTRAST MRA HEAD WITHOUT CONTRAST TECHNIQUE: Multiplanar, multiecho pulse sequences of the brain and surrounding structures were obtained without intravenous contrast. Angiographic images of the head were obtained using MRA technique without contrast. COMPARISON:  Head CT without contrast 06/06/2016. FINDINGS: MRI HEAD FINDINGS Brain: Intra-axial hemorrhage centered in the left pons with intrinsic T1 hyperintensity and mixed T2 signal (subacute, methemoglobin) encompassing 9 x 14 x 19 mm (estimated blood volume 1 mL). Mild surrounding edema. Minimal mass effect. No extension to the adjacent brainstem or cerebellum. Associated susceptibility artifact on diffusion weighted imaging, with no larger area of brainstem restricted diffusion. There is a small nearby chronic lacunar infarct in the right pons (series 10, image 10). There is a small chronic lacunar infarct in the left thalamus (series 10, image 14). No other parenchymal blood products are identified. No cortical encephalomalacia, and otherwise gray and white matter signal is normal. No restricted diffusion elsewhere. No midline shift, mass effect, evidence of mass lesion, ventriculomegaly, or extra-axial collection. Cervicomedullary junction and pituitary are within normal limits. Vascular: Major  intracranial vascular flow voids are preserved, with mild intracranial artery tortuosity. Skull and upper cervical spine: Disc bulging and endplate degeneration including bulky anterior endplate osteophytes at C3-C4. Visualized bone marrow signal is within normal limits. Sinuses/Orbits: Negative orbits soft tissues. Mild right ethmoid and left sphenoid sinus mucosal thickening. Other: Visible internal auditory structures appear normal. Mastoids are clear. Negative scalp soft tissues. 2 cm soft tissue nodule in the inferior left parotid space (series 11, image 22). This appears fairly isointense to the gland on coronal T2 imaging (series 13, image 13). The lesion is conspicuous on diffusion. Normal left stylomastoid foramen. MRA HEAD FINDINGS Antegrade flow in the posterior circulation with dominant distal right vertebral artery. Distal vertebral tortuosity without stenosis. Normal left PICA origin. Mildly tortuous but otherwise normal basilar artery without irregularity or stenosis. Normal SCA and PCA origins. The left posterior communicating artery is present, the right is diminutive or absent. Normal bilateral PCA branches. No abnormal vessels demonstrated about the brainstem hemorrhage. Antegrade flow in both ICA siphons. Bilateral cavernous segment tortuosity and irregularity, greater on the left. No hemodynamically significant siphon stenosis. 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  The left ophthalmic artery origin is normal. The left posterior communicating artery origin is normal. Normal carotid termini. Mild irregularity at the left MCA origin without significant stenosis. Normal ACA and right MCA origins. Tortuous A1 segments. Anterior communicating artery and visualized ACA branches are within normal limits MCA M1 segments are moderately tortuous. MCA bifurcations and visualized MCA branches are within normal limits.  IMPRESSION: 1. 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 Blood volume estimated at 1 mL. Mild surrounding edema without significant mass effect. Burtis Junes this is small vessel disease related with a nearby chronic right pontine lacunar infarct, and a chronic left thalamic lacunar infarct. 2. 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 MRA also reveals mild generalized intracranial artery tortuosity, and left greater than right ICA siphon atherosclerosis. 3. Two cm round soft tissue mass in the inferior left parotid space, with T2 signal nearly isointense to the gland suggesting a well differentiated Parotid Neoplasm. Recommend ENT follow-up. Electronically Signed   By: Genevie Ann M.D.   On: 06/07/2016 17:25   PHYSICAL EXAM General: obese female, awake, talking. A&ox4.  HEENT: [place]/at.  CV: rrr, no m/r/g Neurological Exam :  : Awake and alert oriented 3 with normal speech and language function. Pupils equal reactive. Fundi were not visualized. full visual field. Mild right lower facial asymmetry when she smiles. Sensation intact b/l on face and exts. 5/5 str bilaterally upper and lower ext. except mild weakness of right grip and intrinsic hand muscles. Orbits left over right upper extremity. Speech is normal. 2+ DTR. Normal finger to nose. Gait not tested ASSESSMENT/PLAN Ms. [name] is a 60 y.o. female with history of DM II, HTN, cocaine abuse, medication noncomlpiance presenting with slurred speech, R arm weakness and numbness. She did not receive IV t-PA due to hemorrhagic stroke.    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  Resultant  Slurred speech, arm weakness, numbness  MRI  Left pontine subacute hematoma  MRA  No large vessel stenosis  Carotid Doppler   ordered  2D Echo    ordered  LDL 81  HgbA1c 12.6  SCD or  VTE prophylaxis DIET DYS 2 Room service appropriate? Yes; Fluid consistency: Nectar Thick  No antithrombotic prior to admission, now on No antithrombotic  Ongoing aggressive stroke risk factor management  Therapy recommendations:  pending  Disposition:  Pending  Patient had a hemorrhagic infarct of left pons likely few days ago possibly due to her uncontrolled HTN and cocaine abuse.   Will get MRI. If no new bleeding, may transfer out to tele floor.  Hypertension  Stable. Off cardene gtt.  Goal <180.  Hyperlipidemia  Home meds:  None  LDL 81, goal < 70  Consider adding Lipitor 40mg  daily  Uncontrolled Diabetes with Hyperglycemia >600 Hyperglycemia improved without requiring insulin gtt. Now on   HgbA1c 12.6 goal < 7.0  Has medication noncompliance with her home meds. Will add Lantus 7 units nightly and continue SSI.  Other Stroke Risk Factors  Advanced age  Cigarette and cocaine abuse - advised cessation.   ETOH use  Obesity, Body mass index is 36.48 kg/m., recommend weight loss, diet and exercise as appropriate   DM II and HTN  Other Active Problems  Hypokalemia - repleting.   Hospital day # 3    I have personally examined this patient, reviewed notes, independently  viewed imaging studies, participated in medical decision making and plan of care.ROS completed by me personally and pertinent positives fully documented  I have made any additions or clarifications directly to the above note. Agree with note above. Agree with strict blood pressure control and close neurological monitoring. Continuet oral home meds. Transfer to inpatient rehab if bed availables.    Antony Contras, MD Medical Director [place] Pager: [phone] 06/09/2016 3:12 PM  To contact Stroke Continuity provider, please refer to [email or web address] After hours, contact General Neurology

## 2016-06-09 NOTE — Progress Notes (Signed)
  Echocardiogram 2D Echocardiogram has been performed.  Diamond Nickel 06/09/2016, 3:28 PM

## 2016-06-09 NOTE — Progress Notes (Signed)
Rehab admissions - I have talked with Dr. Naaman Plummer and with attending MD.  We have approval to admit to acute inpatient rehab today.  Call me for questions.  RC:[phone]

## 2016-06-09 NOTE — Care Management Note (Signed)
Case Management Note  Patient Details  Name: [name] MRN: [id number] Date of Birth: 05/14/1956  Subjective/Objective:                    Action/Plan: Pt discharging to CIR today. No further needs per CM.   Expected Discharge Date:                  Expected Discharge Plan:  [name]  In-House Referral:     Discharge planning Services  CM Consult  Post Acute Care Choice:    Choice offered to:     DME Arranged:    DME Agency:     HH Arranged:    [place] Agency:     Status of Service:  Completed, signed off  If discussed at [employer] of Stay Meetings, dates discussed:    Additional Comments:  Pollie Friar, RN 06/09/2016, 2:48 PM

## 2016-06-10 ENCOUNTER — Inpatient Hospital Stay (HOSPITAL_COMMUNITY): Payer: Medicaid Other | Admitting: Speech Pathology

## 2016-06-10 ENCOUNTER — Inpatient Hospital Stay (HOSPITAL_COMMUNITY): Payer: Medicaid Other

## 2016-06-10 ENCOUNTER — Inpatient Hospital Stay (HOSPITAL_COMMUNITY): Payer: Medicaid Other | Admitting: Physical Therapy

## 2016-06-10 ENCOUNTER — Inpatient Hospital Stay (HOSPITAL_COMMUNITY): Payer: Medicaid Other | Admitting: Occupational Therapy

## 2016-06-10 LAB — URINALYSIS, ROUTINE W REFLEX MICROSCOPIC
Bilirubin Urine: NEGATIVE
Glucose, UA: 500 mg/dL — AB
KETONES UR: NEGATIVE mg/dL
LEUKOCYTES UA: NEGATIVE
NITRITE: NEGATIVE
PH: 6 (ref 5.0–8.0)
Protein, ur: >300 mg/dL — AB
SPECIFIC GRAVITY, URINE: 1.014 (ref 1.005–1.030)

## 2016-06-10 LAB — GLUCOSE, CAPILLARY
GLUCOSE-CAPILLARY: 285 mg/dL — AB (ref 65–99)
GLUCOSE-CAPILLARY: 82 mg/dL (ref 65–99)
Glucose-Capillary: 310 mg/dL — ABNORMAL HIGH (ref 65–99)
Glucose-Capillary: 128 mg/dL — ABNORMAL HIGH (ref 65–99)

## 2016-06-10 LAB — BASIC METABOLIC PANEL
ANION GAP: 10 (ref 5–15)
BUN: 7 mg/dL (ref 6–20)
CALCIUM: 8.3 mg/dL — AB (ref 8.9–10.3)
CHLORIDE: 108 mmol/L (ref 101–111)
CO2: 22 mmol/L (ref 22–32)
Creatinine, Ser: 1.35 mg/dL — ABNORMAL HIGH (ref 0.44–1.00)
GFR calc non Af Amer: 42 mL/min — ABNORMAL LOW (ref >60)
GFR, EST AFRICAN AMERICAN: 49 mL/min — AB (ref >60)
Glucose, Bld: 263 mg/dL — ABNORMAL HIGH (ref 65–99)
Potassium: 3.9 mmol/L (ref 3.5–5.1)
SODIUM: 140 mmol/L (ref 135–145)

## 2016-06-10 LAB — URINE MICROSCOPIC-ADD ON

## 2016-06-10 MED ORDER — GLUCERNA SHAKE PO LIQD
237.0000 mL | Freq: Two times a day (BID) | ORAL | Status: DC
  Administered 2016-06-10 – 2016-06-16 (×6): 237 mL via ORAL

## 2016-06-10 MED ORDER — GUAIFENESIN 100 MG/5ML PO SOLN
10.0000 mL | Freq: Four times a day (QID) | ORAL | Status: DC | PRN
  Administered 2016-06-10 – 2016-06-16 (×10): 200 mg via ORAL
  Filled 2016-06-10 (×6): qty 10
  Filled 2016-06-10 (×2): qty 5
  Filled 2016-06-10 (×2): qty 10
  Filled 2016-06-10: qty 50

## 2016-06-10 NOTE — Progress Notes (Signed)
Patient ID: [name], female   DOB: 07-Feb-1956, 60 y.o.   MRN: [id number]   06/10/16.  Corby Muehl is a 59 y.o. female  Admitted for CIR with right hemiparesis with dysarthria and dysphasia secondary to left pontine hemorrhagic stroke secondary to hypertensive crisis  Past Medical History:  Diagnosis Date  . Diabetes mellitus    Hb A1C = 12.6 on 05/15/11, managed on Novolog 70/30, 35 U qam, 25 U qpm  . Hypertension    poorly controlled  . Shortness of breath        Subjective: No new complaints.  Patient is sitting in bed , applying lotion to her legs.  Alert offer no complaints Serum potassium has improved from 2.9 to 3.9  Objective: Vital signs in last 24 hours: Temp:  [98.6 F (37 C)-99.7 F (37.6 C)] 98.6 F (37 C) (12/02 0437) Pulse Rate:  [88-92] 92 (12/02 0437) Resp:  [15-18] 18 (12/02 0437) BP: (159-186)/(82-91) 177/84 (12/02 0902) SpO2:  [96 %-98 %] 98 % (12/02 0437) Weight:  [216 lb 0.8 oz (98 kg)] 216 lb 0.8 oz (98 kg) (12/01 1806) Weight change:  Last BM Date: 06/09/16  Intake/Output from previous day: 12/01 0701 - 12/02 0700 In: 678.8 [I.V.:678.8] Out: -  Last cbgs: CBG (last 3)   Recent Labs  06/09/16 0630 06/09/16 1121 06/10/16 0723  GLUCAP 83 264* 285*   BP Readings from Last 3 Encounters:  06/10/16 (!) 177/84  06/09/16 (!) 159/87  05/16/16 182/90    Physical Exam General: No apparent distress   HEENT: not dry Lungs: Normal effort. Lungs clear to auscultation, no crackles or wheezes. Cardiovascular: Regular rate and rhythm, no edema Abdomen: S/NT/ND; BS(+) Musculoskeletal:  unchanged Neurological: No new neurological deficits.  Speech appears fairly normal.  Right-sided weakness Wounds: N/A    Skin: clear  .  IV line present, left arm Mental state: Alert, oriented, cooperative    Lab Results: BMET    Component Value Date/Time   NA 140 06/10/2016 0521   K 3.9 06/10/2016 0521   CL 108 06/10/2016 0521   CO2 22  06/10/2016 0521   GLUCOSE 263 (H) 06/10/2016 0521   BUN 7 06/10/2016 0521   CREATININE 1.35 (H) 06/10/2016 0521   CALCIUM 8.3 (L) 06/10/2016 0521   GFRNONAA 42 (L) 06/10/2016 0521   GFRAA 49 (L) 06/10/2016 0521   CBC    Component Value Date/Time   WBC 10.8 (H) 06/06/2016 1508   RBC 4.99 06/06/2016 1508   HGB 16.1 (H) 06/06/2016 1508   HCT 46.7 (H) 06/06/2016 1508   PLT 315 06/06/2016 1508   MCV 93.6 06/06/2016 1508   MCH 32.3 06/06/2016 1508   MCHC 34.5 06/06/2016 1508   RDW 14.1 06/06/2016 1508   LYMPHSABS 2.5 06/06/2016 1508   MONOABS 0.6 06/06/2016 1508   EOSABS 0.1 06/06/2016 1508   BASOSABS 0.0 06/06/2016 1508    Medications: I have reviewed the patient's current medications.  Assessment/Plan:  Status post left pontine hemorrhagic stroke secondary to hypertensive crisis Hypertension.  Blood pressure up a bit this morning.  We'll continue to track throughout the day.  Consider dose escalation tomorrow if still elevated Fluids/electrolytes- hypokalemia, resolved Diabetes with neuropathy.  Continue basal bolus insulin.  Blood sugars remain labile Dyslipidemia.  Continue statin therapy    Length of stay, days: [place] , MD 06/10/2016, 11:10 AM

## 2016-06-10 NOTE — Evaluation (Signed)
Physical Therapy Assessment and Plan  Patient Details  Name: [name] MRN: [id number] Date of Birth: Aug 02, 1955  PT Diagnosis: Abnormality of gait and Muscle weakness Rehab Potential: Good ELOS: 7-10    Today's Date: 06/10/2016 PT Individual Time: 1300-1410 PT Individual Time Calculation (min): 70 min     Problem List: Patient Active Problem List   Diagnosis Date Noted  . Right hemiparesis ([place]) 06/09/2016  . 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 Cytotoxic brain edema ([place]) 06/08/2016  . ICH (intracerebral hemorrhage) ([place]) 06/06/2016  . Chest pain, musculoskeletal 05/16/2011  . Vaginal candidiasis 05/16/2011  . Hypertension 05/16/2011  . Diabetes mellitus, type 2 ([place]) 05/16/2011  . CHEST PAIN 10/01/2009  . CELLULITIS AND ABSCESS OF LEG EXCEPT FOOT 08/31/2009  . INSOMNIA 07/13/2008  . MRSA 04/17/2008  . NECK MASS 04/17/2008  . COCAINE ABUSE 11/01/2007  . OBESITY NOS 07/14/2006  . DENTAL CARIES 07/14/2006  . DIABETES MELLITUS, TYPE II 04/26/2006  . ALCOHOL ABUSE 04/26/2006  . TOBACCO ABUSE 04/26/2006  . DEPRESSION 04/26/2006  . Essential hypertension 04/26/2006  . GERD 04/26/2006  . LOW BACK PAIN 04/26/2006    Past Medical History:  Past Medical History:  Diagnosis Date  . Diabetes mellitus    Hb A1C = 12.6 on 05/15/11, managed on Novolog 70/30, 35 U qam, 25 U qpm  . Hypertension    poorly controlled  . Shortness of breath    Past Surgical History:  Past Surgical History:  Procedure Laterality Date  . ANKLE FRACTURE SURGERY  2007    Assessment & Plan Clinical Impression: Patient is a 60 y.o. right handed female with history of poorly controlled diabetes mellitus, hypertension,Chronic back pain, tobacco cocaine abuse. Per chart review patient lives with significant other and independent prior to admission. 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 Presented 06/06/2016 with tremors and reported weakness of the left hand and facial droop as  well as slurred speech over the past 3-4 days with increasing lethargy as well as a recent fall 2 days prior to admission. SBP 151-202. Urine drug screen positive for cocaine. Troponin negative.Mildly elevated creatinine 1.7. 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 While in the CT scan reported seizure and was given Ativan. EEG showed no seizure activity. Placed on nicardipine drip for blood pressure control. Loaded with Keppra for seizure prophylaxis. Follow-up MRI/MRA showed stable hemorrhage, no related findings on intracranial MRA. Patient transferred to CIR on 06/09/2016 .   Patient currently requires min with mobility secondary to muscle weakness and decreased standing balance and decreased balance strategies.  Prior to hospitalization, patient was modified independent  with mobility and lived with Significant other in a House home.  Home access is  Level entry.  Patient will benefit from skilled PT intervention to maximize safe functional mobility, minimize fall risk and decrease caregiver burden for planned discharge home with intermittent assist.  Anticipate patient will benefit from follow up [facility] at discharge.  PT - End of Session Activity Tolerance: Tolerates 30+ min activity with multiple rests Endurance Deficit: Yes PT Assessment Rehab Potential (ACUTE/IP ONLY): Good Barriers to Discharge: Inaccessible home environment;Decreased caregiver support PT Patient demonstrates impairments in the following area(s): Balance;Endurance;Motor;Safety PT Transfers Functional Problem(s): Bed Mobility;Bed to Chair;Car;Furniture;Floor PT Locomotion Functional Problem(s): Ambulation;Wheelchair Mobility;Stairs PT Plan PT Intensity: Minimum of 1-2 x/day ,45 to 90 minutes  PT Frequency: 5 out of 7 days PT Duration Estimated Length of Stay: 7-10  PT  Treatment/Interventions: Ambulation/gait training;Balance/vestibular training;Community reintegration;Discharge planning;Disease management/prevention;DME/adaptive equipment instruction;Functional mobility training;Neuromuscular re-education;Patient/family education;Psychosocial support;Skin care/wound management;Stair training;Therapeutic Activities;Therapeutic Exercise;UE/LE Strength taining/ROM;UE/LE Coordination activities;Visual/perceptual remediation/compensation;Wheelchair propulsion/positioning PT Transfers Anticipated Outcome(s): mod I with LRAD  PT Locomotion Anticipated Outcome(s): Mod I with LRAD  PT Recommendation Follow Up Recommendations: Home health PT Patient destination: Home Equipment Recommended: To be determined;Rolling walker with 5" wheels  Skilled Therapeutic Intervention Patient received sitting in WC and agreeable to PT. PT instructed patient in evaluation and initiated treatment intervention; see below for results. PT educated patient in basic transfers, gait without AD on level and unlevel surface, WC mobility with BUE propulsion, car transfer, bed mobility, as well as plan of care, treatment interventions and d/c recommendations.  Patient returned to room and  left supine in bed with call bell in reach an all needs met.   PT Evaluation Precautions/Restrictions Precautions Precautions: Fall Restrictions Weight Bearing Restrictions: No General Chart Reviewed: Yes Additional Pertinent History: DM II, Cocane use, Tobacco use Vital Signs Pain Pain Assessment Pain Assessment: No/denies pain Home Living/Prior Functioning Home Living Available Help at Discharge: Friend(s);Family Type of Home: House Home Access: Level entry Home Layout: One level Bathroom Shower/Tub: Chiropodist: Standard Bathroom Accessibility: Yes  Lives With: Significant other Prior Function Level of Independence: Requires assistive device for independence (SPC  occassionally)  Able to Take Stairs?: Yes Driving: No Vocation: On disability Leisure: Hobbies-yes (Comment) Comments: Cooking and baking Vision/Perception  Vision - Assessment Additional Comments: Patient reports slight increase in blurred vision, but able to see objects in room with ease  Cognition Overall Cognitive Status: Impaired/Different from baseline Arousal/Alertness: Awake/alert Orientation Level: Oriented X4 Attention: Sustained Sustained Attention: Appears intact Memory: Impaired Memory Impairment: Decreased recall of new information Awareness: Appears intact Problem Solving: Appears intact Safety/Judgment: Impaired (slightly implusive ) Sensation Sensation Light Touch: Appears Intact Proprioception: Appears Intact Coordination Gross Motor Movements are Fluid and Coordinated: Yes Fine Motor Movements are Fluid and Coordinated: Yes Motor  Motor Motor: Hemiplegia Motor - Skilled Clinical Observations: Mild R sided weakness  Mobility Bed Mobility Bed Mobility: Rolling Right;Rolling Left;Supine to Sit;Sit to Supine Rolling Right: 5: Supervision Rolling Right Details: Verbal cues for precautions/safety Rolling Left: 5: Supervision Rolling Left Details: Verbal cues for precautions/safety Supine to Sit: 5: Supervision Supine to Sit Details: Verbal cues for precautions/safety;Verbal cues for technique Sit to Supine: 5: Supervision Sit to Supine - Details: Verbal cues for precautions/safety Transfers Transfers: Yes Sit to Stand: 4: Min assist Sit to Stand Details: Verbal cues for precautions/safety Stand Pivot Transfers: 4: Min assist Stand Pivot Transfer Details: Verbal cues for technique;Verbal cues for precautions/safety Locomotion  Ambulation Ambulation: Yes Ambulation/Gait Assistance: 4: Min assist Ambulation Distance (Feet): 200 Feet Assistive device: None Ambulation/Gait Assistance Details: Verbal cues for sequencing;Verbal cues for gait pattern;Verbal  cues for precautions/safety Gait Gait: Yes Gait Pattern: Impaired Gait Pattern: Narrow base of support;Step-through pattern Stairs / Additional Locomotion Stairs: Yes Stairs Assistance: 4: Min assist;3: Mod assist Stairs Assistance Details: Verbal cues for gait pattern;Verbal cues for safe use of DME/AE;Verbal cues for precautions/safety Stair Management Technique: Two rails Number of Stairs: 12 Height of Stairs: 6 (and 3 ) Product manager Mobility: Yes Wheelchair Assistance: 5: Investment banker, operational Details: Verbal cues for technique;Verbal cues for Information systems manager: Both upper extremities Wheelchair Parts Management: Needs assistance Distance: 173f  Trunk/Postural Assessment  Cervical Assessment Cervical Assessment: Within Functional Limits  Thoracic Assessment Thoracic Assessment: Within Functional Limits Lumbar Assessment Lumbar Assessment: Within Functional Limits Postural Control Postural Control: Deficits on evaluation (delayed)  Balance Balance Balance Assessed: Yes Dynamic Sitting Balance Dynamic Sitting - Level of Assistance: 6: Modified independent (Device/Increase time) Static Standing Balance Static Standing - Level of Assistance: 5: Stand by assistance Dynamic Standing Balance Dynamic Standing - Level of Assistance: 4: Min assist Extremity Assessment      RLE Assessment RLE Assessment: Exceptions to [facility] RLE AROM (degrees) RLE Overall AROM Comments: lacking 25% hip flexion. all others WFL.  RLE PROM (degrees) RLE Overall PROM Comments: WFL RLE Strength RLE Overall Strength Comments: 4-/5 hip flexion. all others tested 4+/5 LLE Assessment LLE Assessment: Within Functional Limits   See Function Navigator for Current Functional Status.   Refer to Care Plan for Long Term Goals  Recommendations for other services: None  Discharge Criteria: Patient will be discharged from PT if patient refuses  treatment 3 consecutive times without medical reason, if treatment goals not met, if there is a change in medical status, if patient makes no progress towards goals or if patient is discharged from hospital.  The above assessment, treatment plan, treatment alternatives and goals were discussed and mutually agreed upon: by patient  Lorie Phenix 06/10/2016, 2:00 PM

## 2016-06-10 NOTE — Progress Notes (Signed)
Initial Nutrition Assessment  DOCUMENTATION CODES:  Obesity unspecified  INTERVENTION:  Glucerna Shake po BID, each supplement provides 220 kcal and 10 grams of protein  D/C ensure Enlive due to hyperglycemia.   Food requests  NUTRITION DIAGNOSIS:  Swallowing difficulty related to [place] as evidenced by Need for D2, NTL diet per SLP reccomendations.  GOAL:  Patient will meet greater than or equal to 90% of their needs  MONITOR:  PO intake, Supplement acceptance, Diet advancement, Labs  REASON FOR ASSESSMENT:  Malnutrition Screening Tool    ASSESSMENT:  60 y/o female PMHx DM, HTN, Chronic back pain, Tobacco/cocaine abuse. Presented to hospital 11/28  With tremors/weakness. Evaluated and found to have suffered ICH. Presented to in patient rehab  Pt reports that she has a good appetite, but has had poor PO intake secondary to her diet restrictions, specifically the Dysphagia 2 restrictions.  She doesn't like her food cut up that fine.   She says her UBW was 240 lbs and was surprised to hear she is 216 lbs now.   PTA. She did not follow any time of diet at home or follow any sort of diet.   Pt ate her breakfast well this morning. RD took some food requests to promote po intake. She said she liked Ensure, but given her BG checks, not appropriate at this time  Labs: A1C: 11.4, BG >250 Medications: Ensure Enlive, Folate, insulin, mvi w/ min, KCL, Thiamine, ppi,    Recent Labs Lab 06/07/16 1053 06/07/16 1352 06/08/16 0241 06/10/16 0521  NA 146*  --  143 140  K 2.8*  --  2.9* 3.9  CL 114*  --  113* 108  CO2 21*  --  19* 22  BUN 8  --  10 7  CREATININE 1.38*  --  1.37* 1.35*  CALCIUM 8.4*  --  8.2* 8.3*  MG  --  1.5*  --   --   GLUCOSE 167*  --  277* 263*    Diet Order:  DIET DYS 2 Room service appropriate? Yes; Fluid consistency: Nectar Thick  Skin:  Reviewed, no issues  Last BM:  12/1 - Diarrhea  Height:  Ht Readings from Last 1 Encounters:  06/09/16 5\' 3"  (1.6  m)   Weight:  Wt Readings from Last 1 Encounters:  06/09/16 216 lb 0.8 oz (98 kg)   Wt Readings from Last 10 Encounters:  06/09/16 216 lb 0.8 oz (98 kg)  06/07/16 205 lb 14.6 oz (93.4 kg)  01/14/16 223 lb (101.2 kg)  05/16/11 224 lb 6.9 oz (101.8 kg)  10/01/09 220 lb 14.4 oz (100.2 kg)  08/31/09 219 lb 3.2 oz (99.4 kg)  02/11/09 209 lb 9.6 oz (95.1 kg)  07/13/08 211 lb (95.7 kg)  04/17/08 205 lb 6.4 oz (93.2 kg)  11/01/07 209 lb 9.6 oz (95.1 kg)  She was admitted to hospital as 206 lbs.   Ideal Body Weight:  52.27 kg  BMI:  Body mass index is 38.27 kg/m.  Estimated Nutritional Needs:  Kcal:  1600-1800 (17-19 kcal/kg bw) Protein:  52-63 g (1-1.2 g/kg bw) Fluid:  1.6-1.8 L fluid  EDUCATION NEEDS:  Education needs no appropriate at this time  Burtis Junes RD, LDN, [place] [facility] Pager: [phone] 06/10/2016 1:12 PM

## 2016-06-10 NOTE — Evaluation (Signed)
Occupational Therapy Assessment and Plan  Patient Details  Name: [name] MRN: [id number] Date of Birth: [birth year]  OT Diagnosis: altered mental status, hemiplegia affecting dominant side and muscle weakness (generalized) Rehab Potential: Rehab Potential (ACUTE ONLY): Good ELOS: 8-10 days   Today's Date: 06/10/2016 OT Individual Time:  - 5885-0277 Individual Treatment Time Calculation: 61 min        Problem List:  Patient Active Problem List   Diagnosis Date Noted  . Right hemiparesis ([place]) 06/09/2016  . 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 Cytotoxic brain edema ([place]) 06/08/2016  . ICH (intracerebral hemorrhage) ([place]) 06/06/2016  . Chest pain, musculoskeletal 05/16/2011  . Vaginal candidiasis 05/16/2011  . Hypertension 05/16/2011  . Diabetes mellitus, type 2 ([place]) 05/16/2011  . CHEST PAIN 10/01/2009  . CELLULITIS AND ABSCESS OF LEG EXCEPT FOOT 08/31/2009  . INSOMNIA 07/13/2008  . MRSA 04/17/2008  . NECK MASS 04/17/2008  . COCAINE ABUSE 11/01/2007  . OBESITY NOS 07/14/2006  . DENTAL CARIES 07/14/2006  . DIABETES MELLITUS, TYPE II 04/26/2006  . ALCOHOL ABUSE 04/26/2006  . TOBACCO ABUSE 04/26/2006  . DEPRESSION 04/26/2006  . Essential hypertension 04/26/2006  . GERD 04/26/2006  . LOW BACK PAIN 04/26/2006    Past Medical History:  Past Medical History:  Diagnosis Date  . Diabetes mellitus    Hb A1C = 12.6 on 05/15/11, managed on Novolog 70/30, 35 U qam, 25 U qpm  . Hypertension    poorly controlled  . Shortness of breath    Past Surgical History:  Past Surgical History:  Procedure Laterality Date  . ANKLE FRACTURE SURGERY  2007    Assessment & Plan Clinical Impression: [name] is a 60 y.o. right handed female with history of poorly controlled diabetes mellitus, hypertension,Chronic back pain, tobacco cocaine abuse. Per chart review patient lives with significant other and independent prior to admission. 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 Presented 06/06/2016 with tremors and reported weakness of the left hand and facial droop as well as slurred speech over the past 3-4 days with increasing lethargy as well as a recent fall 2 days prior to admission. SBP 151-202. Urine drug screen positive for cocaine. Troponin negative.Mildly elevated creatinine 1.7. 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 While in the CT scan reported seizure and was given Ativan. EEG showed no seizure activity. Placed on nicardipine drip for blood pressure control. Loaded with Keppra for seizure prophylaxis. Follow-up MRI/MRA showed stable hemorrhage, no related findings on intracranial MRA.Echocardiogram and carotid Dopplers are pending. Hemoglobin A1c 11.4 with insulin therapy as directed. Currently on a dysphagia #2 nectar thick liquid diet.Bouts of hypokalemia 2.9 with supplement added. Physical and occupational therapy evaluation completed 06/07/2016 with recommendations of physical medicine rehabilitation consult.Patient was admitted for a comprehensive rehabilitation program   Patient currently requires mod with basic self-care skills secondary to muscle weakness, decreased cardiorespiratoy endurance, decreased safety awareness and decreased memory and decreased standing balance.  Prior to hospitalization, patient could complete BADLs with independent .  Patient will benefit from skilled intervention to increase independence with basic self-care skills prior to discharge home with temporary assist of friend. Anticipate patient will require intermittent supervision and follow up home health and follow up outpatient.  OT - End of Session Endurance Deficit: Yes OT Assessment Rehab Potential (ACUTE ONLY): Good Barriers to Discharge: Decreased  caregiver support OT Patient demonstrates impairments in the  following area(s): Balance;Safety;Cognition;Vision;Endurance;Motor OT Basic ADL's Functional Problem(s): Bathing;Dressing;Toileting OT Advanced ADL's Functional Problem(s): Simple Meal Preparation;Laundry;Light Housekeeping OT Transfers Functional Problem(s): Toilet;Tub/Shower OT Additional Impairment(s): Fuctional Use of Upper Extremity OT Plan OT Intensity: Minimum of 1-2 x/day, 45 to 90 minutes OT Frequency: 5 out of 7 days OT Duration/Estimated Length of Stay: 8-10 days OT Treatment/Interventions: Discharge planning;Self Care/advanced ADL retraining;Therapeutic Activities;UE/LE Coordination activities;Functional mobility training;Patient/family education;Therapeutic Exercise;Cognitive remediation/compensation;DME/adaptive equipment instruction;Psychosocial support;UE/LE Strength taining/ROM OT Self Feeding Anticipated Outcome(s): N/A OT Basic Self-Care Anticipated Outcome(s): Mod I  OT Toileting Anticipated Outcome(s): Mod I  OT Bathroom Transfers Anticipated Outcome(s): Mod I  OT Recommendation Recommendations for Other Services: Neuropsych consult Patient destination: Home Follow Up Recommendations: Home health OT;Outpatient OT Equipment Recommended: To be determined   Skilled Therapeutic Intervention Skilled OT session completed with focus on initial evaluation, R UE NMR, safety awareness, cognition and standing balance. Pt was finishing breakfast at EOB at time of arrival, provided education regarding OT role/POC and CIR. Pt had 3 coughing spells when eating with instruction to clear throat and drink her thickened liquid. Nursing also notified. Breakfast was terminated with education on resuming with SLP this morning due to coughing. Pt completed stand pivot transfer to w/c with Mod A sit<stand and instruction on safety. Pt completed transfer in same manner to tub bench with use of grab bars. IV site covered. Bathing completed with overall Min A, predominantly using L UE (she is R UE  dominant; dropped items with R UE when cued to use). Mod cues for sequencing. Afterwards pt reported needing to void. Stand pivot<w/c<BSC over toilet completed with Min-Mod A with pt reporting that she was (accurately) urinating mid transfer. After floor was dried and sanitized, pt completed dressing at sink with overall Min-Mod A sit<stand and instruction on safety and integrating R UE. Grooming/oral care completed w/c level at sink with supervision. Cues throughout provided for impulsivity/cognitive awareness. BP assessed at end of tx though pt was asymptomatic of dizziness/SOB. Vitals recorded below, nursing notified and reported that vitals were within pts goal threshold. Pt was left in w/c with all needs within reach, safety belt donned, and family member present at time of departure.   OT Evaluation Precautions/Restrictions  Precautions Precautions: Fall Precaution Comments:  (Monitor BP) Restrictions Weight Bearing Restrictions: No General Chart Reviewed: Yes Family/Caregiver Present: No Vital Signs BP 177/84; pulse 100bpm; 02 sats on room air: 96% Therapy Vitals Temp: 98.8 F (37.1 C) Temp Source: Oral Pulse Rate: (!) 101 Resp: 18 BP: (!) 158/84 Patient Position (if appropriate): Sitting Oxygen Therapy SpO2: 100 % O2 Device: Not Delivered Pain Pain Assessment Pain Assessment: No/denies pain Home Living/Prior Functioning Home Living Family/patient expects to be discharged to:: Private residence Living Arrangements: Spouse/significant other, Other relatives Available Help at Discharge: Friend(s), Family, Available PRN/intermittently Type of Home: House Home Access: Level entry Home Layout: One level Bathroom Shower/Tub: Chiropodist: Standard Bathroom Accessibility: Yes  Lives With: Significant other IADL History Homemaking Responsibilities: Yes Meal Prep Responsibility: Primary Laundry Responsibility: Primary Cleaning Responsibility: Primary Bill  Paying/Finance Responsibility: Primary Shopping Responsibility: Primary Mode of Transportation: Car Education: 9th grade Occupation: Retired 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 her partner [name]  Leisure and Hobbies: Playing cards Prior Function Level of Independence: Independent with basic ADLs, Independent with gait, Independent with homemaking with ambulation  Able to Take Stairs?: Yes Driving: Yes (Per pt report,  drove for grocery/pharmacy errands) Vocation: On disability Leisure: Hobbies-yes (Comment) Comments: Playing cards ADL ADL ADL Comments: Please see functional navigator for ADL status Vision/Perception  Vision- History Baseline Vision/History: Wears glasses Wears Glasses: Reading only Patient Visual Report: Blurring of vision (Per pt report, but blurred vision did not interfere with BADL completion during eval) Vision- Assessment Vision Assessment?: Vision impaired- to be further tested in functional context Additional Comments: Patient reports slight increase in blurred vision, but able to see objects in room with ease  Cognition Overall Cognitive Status: Impaired/Different from baseline Arousal/Alertness: Awake/alert Orientation Level: Person;Situation;Place Person: Oriented Place: Oriented Situation: Oriented Year: 2017 Month: December Day of Week: Correct Memory: Impaired Memory Impairment: Decreased recall of new information Immediate Memory Recall: Sock;Blue;Bed Memory Recall: Sock;Blue;Bed Memory Recall Sock: Without Cue Memory Recall Blue: Without Cue Memory Recall Bed: Without Cue Attention: Sustained Sustained Attention: Appears intact Awareness: Impaired Awareness Impairment: Anticipatory impairment;Emergent impairment Problem Solving: Appears intact Behaviors: Impulsive Safety/Judgment: Impaired Sensation Sensation Light Touch: Appears Intact Stereognosis: Not  tested Hot/Cold: Appears Intact Proprioception: Appears Intact Coordination Gross Motor Movements are Fluid and Coordinated: Yes Fine Motor Movements are Fluid and Coordinated: No (R UE [place] deficits) Motor  Motor Motor: Hemiplegia Motor - Skilled Clinical Observations:  (Right hemiparesis) Mobility  Bed Mobility Bed Mobility: Rolling Right;Rolling Left;Supine to Sit;Sit to Supine Rolling Right: 5: Supervision Rolling Right Details: Verbal cues for precautions/safety Rolling Left: 5: Supervision Rolling Left Details: Verbal cues for precautions/safety Supine to Sit: 5: Supervision Supine to Sit Details: Verbal cues for precautions/safety;Verbal cues for technique Sit to Supine: 5: Supervision Sit to Supine - Details: Verbal cues for precautions/safety Transfers Sit to Stand: 3: Mod assist Sit to Stand Details: Verbal cues for precautions/safety;Verbal cues for technique  Trunk/Postural Assessment  Cervical Assessment Cervical Assessment: Within Functional Limits Thoracic Assessment Thoracic Assessment: Within Functional Limits Lumbar Assessment Lumbar Assessment: Within Functional Limits Postural Control Postural Control: Deficits on evaluation  Balance Balance Balance Assessed: Yes Dynamic Sitting Balance Dynamic Sitting - Level of Assistance: 6: Modified independent (Device/Increase time) Static Standing Balance Static Standing - Level of Assistance: 5: Stand by assistance Dynamic Standing Balance Dynamic Standing - Level of Assistance: 4: Min assist Extremity/Trunk Assessment RUE Assessment RUE Assessment: Exceptions to [facility] (3-/5 MMT) LUE Assessment LUE Assessment: Exceptions to [facility] (3+/5 MMT)   See Function Navigator for Current Functional Status.   Refer to Care Plan for Long Term Goals  Recommendations for other services: Neuropsych  Discharge Criteria: Patient will be discharged from OT if patient refuses treatment 3 consecutive times without medical  reason, if treatment goals not met, if there is a change in medical status, if patient makes no progress towards goals or if patient is discharged from hospital.  The above assessment, treatment plan, treatment alternatives and goals were discussed and mutually agreed upon: by patient  Skeet Simmer 06/10/2016, 4:39 PM

## 2016-06-10 NOTE — Evaluation (Addendum)
Speech Language Pathology Assessment and Plan  Patient Details  Name: [name] MRN: [id number] Date of Birth: 15-Apr-1956  SLP Diagnosis: Cognitive Impairments;Dysphagia  Rehab Potential: Good ELOS: 7 to days   Today's Date: 06/10/2016 SLP Individual Time: 0930-1030 SLP Individual Time Calculation (min): 60 min    Problem List: Patient Active Problem List   Diagnosis Date Noted  . Right hemiparesis ([place]) 06/09/2016  . 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 Cytotoxic brain edema ([place]) 06/08/2016  . ICH (intracerebral hemorrhage) ([place]) 06/06/2016  . Chest pain, musculoskeletal 05/16/2011  . Vaginal candidiasis 05/16/2011  . Hypertension 05/16/2011  . Diabetes mellitus, type 2 ([place]) 05/16/2011  . CHEST PAIN 10/01/2009  . CELLULITIS AND ABSCESS OF LEG EXCEPT FOOT 08/31/2009  . INSOMNIA 07/13/2008  . MRSA 04/17/2008  . NECK MASS 04/17/2008  . COCAINE ABUSE 11/01/2007  . OBESITY NOS 07/14/2006  . DENTAL CARIES 07/14/2006  . DIABETES MELLITUS, TYPE II 04/26/2006  . ALCOHOL ABUSE 04/26/2006  . TOBACCO ABUSE 04/26/2006  . DEPRESSION 04/26/2006  . Essential hypertension 04/26/2006  . GERD 04/26/2006  . LOW BACK PAIN 04/26/2006   Past Medical History:  Past Medical History:  Diagnosis Date  . Diabetes mellitus    Hb A1C = 12.6 on 05/15/11, managed on Novolog 70/30, 35 U qam, 25 U qpm  . Hypertension    poorly controlled  . Shortness of breath    Past Surgical History:  Past Surgical History:  Procedure Laterality Date  . ANKLE FRACTURE SURGERY  2007    Assessment / Plan / Recommendation Clinical Impression [name] is a 60 y.o. right handed female with history of poorly controlled diabetes mellitus, hypertension,Chronic back pain, tobacco cocaine abuse. Per chart review patient lives with significant other and independent prior to admission. 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 Presented 06/06/2016 with tremors and reported weakness of  the left hand and facial droop as well as slurred speech over the past 3-4 days with increasing lethargy as well as a recent fall 2 days prior to admission. SBP 151-202. Urine drug screen positive for cocaine. Troponin negative.Mildly elevated creatinine 1.7. 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 While in the CT scan reported seizure and was given Ativan. EEG showed no seizure activity. Placed on nicardipine drip for blood pressure control. Loaded with Keppra for seizure prophylaxis. Follow-up MRI/MRA showed stable hemorrhage, no related findings on intracranial MRA.Echocardiogram and carotid Dopplers are pending. Hemoglobin A1c 11.4 with insulin therapy as directed. Currently on a dysphagia #2 nectar thick liquid diet.Bouts of hypokalemia 2.9 with supplement added. Physical and occupational therapy evaluation completed 06/07/2016 with recommendations of physical medicine rehabilitation consult.Patient was admitted for a comprehensive rehabilitation program. Pt admitted to CIR on 06/09/16.   Bedside Swallow Evaluation and Cognitive Linguistic Evaluation completed on 06/10/16. Bedside Swallow Evaluation revealed moderate dysphagia but progress with trials of ice chips as evidenced by no overt s/s of asiration directly related to consumption of ice chips. Voice remained clear. Pt with suspected delayed swallow initiation of ~2 seconds with trials of thin liquids and periodic coughing present during trials. However, pt with history of smoking and has cough at baseline so difficult to determine for penetration/aspiration of thin liquids. Recommend pt remain on dysphagia 2 diet with nectar thick liquids, ice chips allowed according to protocol and instrumental study needed to  assess possibility of upgrading to thin liquids. Pt's cognitive linguistic abilities were assessed  using the MOCA (version 7.1). Pt obtained score of 23/30 with average normal score considered greater than 26. Of note, pt unable to compete math section which affected score. Clinically, pt  demonstrated mild short term memory deficits and requires more than a reasonable amount of time when recalling new information. The above mentioned deficits place the pt at a moderate aspiration risk and impact pt's ability to function independently. Pt would benefit from skilled SLP intervention in order to maximize her functional independence prior to discharge. Anticipate patient will need follow up SLP services.     Skilled Therapeutic Interventions          Bedside swallow and cognitive-linguistic evaluations completed with results and recommendations reviewed with pt and nursing. Skilled dysphagia therapy focused on trials of ice chips and thin liquids with water protocol initiated for ice chips only. Pt would benefit from further instrumental testing at next available date. Pt also required more than a reasonable amount of time to recall new information.    SLP Assessment  Patient will need skilled Speech Lanaguage Pathology Services during CIR admission    Recommendations  SLP Diet Recommendations: Dysphagia 2 (Fine chop);Nectar;Other (Comment) (Water protocol initiation for ice chips) Liquid Administration via: Cup Medication Administration: Whole meds with puree Supervision: Patient able to self feed;Full supervision/cueing for compensatory strategies Compensations: Slow rate;Small sips/bites Postural Changes and/or Swallow Maneuvers: Seated upright 90 degrees Oral Care Recommendations: Oral care BID Patient destination: Home Follow up Recommendations: Other (comment) (TBD)    SLP Frequency 3 to 5 out of 7 days   SLP Duration  SLP Intensity  SLP Treatment/Interventions    Minumum of 1-2 x/day, 30 to 90 minutes  Functional tasks;Environmental controls;Dysphagia/aspiration precaution  training;Patient/family education;Therapeutic Exercise;Therapeutic Activities;Cognitive remediation/compensation    Pain Pain Assessment Pain Assessment: No/denies pain  Prior Functioning Cognitive/Linguistic Baseline: Within functional limits Type of Home: House  Lives With: Significant other Available Help at Discharge: Family Education: 9th grade Vocation: Unemployed  Function:  Eating Eating   Modified Consistency Diet: Yes Eating Assist Level: Supervision or verbal cues           Cognition Comprehension Comprehension assist level: Follows basic conversation/direction with extra time/assistive device  Expression   Expression assist level: Expresses basic needs/ideas: With extra time/assistive device  Social Interaction Social Interaction assist level: Interacts appropriately with others - No medications needed.  Problem Solving Problem solving assist level: Solves complex 90% of the time/cues < 10% of the time  Memory Memory assist level: More than reasonable amount of time   Short Term Goals: Week 1: SLP Short Term Goal 1 (Week 1): Pt will utilize external memory aids to recall new, daily information with Min A verbal and question cues.  SLP Short Term Goal 2 (Week 1): Pt will consume tirals of thin liquids with minimal overt s/s of aspiration over 2 sessions to asssess readiness for repeat MBS with supervision verbal cues.  SLP Short Term Goal 3 (Week 1): Pt will consume dysphagia 2 diet with no overt s/s of aspiration over 3 sessions to assess readiness for diet upgrade with supervision verbal cues.   Refer to Care Plan for Long Term Goals  Recommendations for other services: None  Discharge Criteria: Patient will be discharged from SLP if patient refuses treatment 3 consecutive times without medical reason, if treatment goals not met, if there is a change in medical status, if patient makes no progress  towards goals or if patient is discharged from hospital.  The  above assessment, treatment plan, treatment alternatives and goals were discussed and mutually agreed upon: by patient  Jasai Sorg Rutherford Nail 06/10/2016, 12:33 PM

## 2016-06-11 LAB — GLUCOSE, CAPILLARY
GLUCOSE-CAPILLARY: 226 mg/dL — AB (ref 65–99)
GLUCOSE-CAPILLARY: 260 mg/dL — AB (ref 65–99)
Glucose-Capillary: 185 mg/dL — ABNORMAL HIGH (ref 65–99)

## 2016-06-11 LAB — URINE CULTURE

## 2016-06-11 MED ORDER — POTASSIUM CHLORIDE 20 MEQ/15ML (10%) PO SOLN
20.0000 meq | Freq: Two times a day (BID) | ORAL | Status: DC
  Administered 2016-06-11 – 2016-06-14 (×7): 20 meq via ORAL
  Filled 2016-06-11 (×7): qty 15

## 2016-06-11 NOTE — Progress Notes (Signed)
Pt called around 230 am c/o soreness all over body and unable to move without pain. Pt was incont at that time also due to urgency. Cleaned patient with assistance from the NT and flexeril 10 mg given at 0250. No other complaints at this time. Cont to monitor paitent. Avereigh Spainhower, Dione Plover

## 2016-06-11 NOTE — Progress Notes (Signed)
Patient ID: [name], female   DOB: 1955-11-26, 60 y.o.   MRN: [id number]   06/11/16.  [name] is a 60 y.o. 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  She has functional disability due to right hemiparesis with dysarthria and dysphagia  Past Medical History:  Diagnosis Date  . Diabetes mellitus    Hb A1C = 12.6 on 05/15/11, managed on Novolog 70/30, 35 U qam, 25 U qpm  . Hypertension    poorly controlled  . Shortness of breath         Subjective: States that she did not sleep well last night.  Received trazodone with little benefit.  Has had low-volume epistaxis this morning that has resolved.   Objective: Vital signs in last 24 hours: Temp:  [98.8 F (37.1 C)-99.3 F (37.4 C)] 98.8 F (37.1 C) (12/03 0738) Pulse Rate:  [101-105] 102 (12/03 0738) Resp:  [18-24] 24 (12/03 0738) BP: (158-182)/(84-89) 174/89 (12/03 0738) SpO2:  [93 %-100 %] 96 % (12/03 0738) Weight change:  Last BM Date: 06/08/16  Intake/Output from previous day: 12/02 0701 - 12/03 0700 In: 360 [P.O.:360] Out: -  Last cbgs: CBG (last 3)   Recent Labs  06/10/16 1631 06/10/16 2233 06/11/16 0645  GLUCAP 128* 82 185*   BP Readings from Last 3 Encounters:  06/11/16 (!) 174/89  06/09/16 (!) 159/87  05/16/16 182/90    Physical Exam General: No apparent distress   HEENT: not dry; no obvious trauma to the nasal mucosa.  No dried blood could be identified Lungs: Normal effort. Lungs clear to auscultation, no crackles or wheezes. Cardiovascular: Regular rate and rhythm, no edema Abdomen: S/NT/ND; BS(+) Musculoskeletal:  unchanged Neurological: No new neurological deficits Wounds: N/A    Skin: clear   Mental state: Alert, oriented, cooperative    Lab Results: BMET    Component Value Date/Time   NA 140 06/10/2016 0521   K 3.9 06/10/2016 0521   CL 108 06/10/2016 0521   CO2 22 06/10/2016 0521   GLUCOSE 263 (H)  06/10/2016 0521   BUN 7 06/10/2016 0521   CREATININE 1.35 (H) 06/10/2016 0521   CALCIUM 8.3 (L) 06/10/2016 0521   GFRNONAA 42 (L) 06/10/2016 0521   GFRAA 49 (L) 06/10/2016 0521   CBC    Component Value Date/Time   WBC 10.8 (H) 06/06/2016 1508   RBC 4.99 06/06/2016 1508   HGB 16.1 (H) 06/06/2016 1508   HCT 46.7 (H) 06/06/2016 1508   PLT 315 06/06/2016 1508   MCV 93.6 06/06/2016 1508   MCH 32.3 06/06/2016 1508   MCHC 34.5 06/06/2016 1508   RDW 14.1 06/06/2016 1508   LYMPHSABS 2.5 06/06/2016 1508   MONOABS 0.6 06/06/2016 1508   EOSABS 0.1 06/06/2016 1508   BASOSABS 0.0 06/06/2016 1508    Studies/Results: Dg Chest Port 1 View  Result Date: 06/10/2016 CLINICAL DATA:  Fever and productive cough. EXAM: PORTABLE CHEST 1 VIEW COMPARISON:  06/06/2016. FINDINGS: Decreased inspiration with no gross change in borderline cardiomegaly and mildly prominent pulmonary vasculature. Crowding of the interstitial markings. Mild right basilar atelectasis. Thoracic spine degenerative changes. IMPRESSION: 1. Decreased inspiration with mild right basilar atelectasis. 2. Stable borderline cardiomegaly and mild pulmonary vascular congestion. Electronically Signed   By: Claudie Revering M.D.   On: 06/10/2016 16:24    Medications: I have reviewed the patient's current medications.  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  Systolic blood  pressure readings continue to be slightly elevated.  We'll continue to observe today.   History of hypokalemia.  Patient on potassium supplementation.  We'll check electrolytes in the morning Diabetes mellitus.  Reasonable control.  Blood sugars a bit labile Episodic insomnia.  Consider change in therapy if persistent tonight    Length of stay, days: 2  Nyoka Cowden , MD 06/11/2016, 9:48 AM

## 2016-06-11 NOTE — Progress Notes (Signed)
Around 2330 patient had min amount bloody sputum that was suctioned by the patient. Assessed patient mouth for dried blood to lip in and brown residue on tongue, but no active bleeding noted inside the much. Pt states a pill with a rough edge that she swallowed at bedtime may have cut or irritated her throat to make it bleed. Continued to monitor patient throughout the night and no other bleeding noted. Cont plan of care. Renelle Stegenga, Dione Plover

## 2016-06-11 NOTE — Progress Notes (Signed)
Nosebleed this morning, frank red blood. MD made aware of both day and night bleeding episodes. Will continue to monitor.

## 2016-06-12 ENCOUNTER — Inpatient Hospital Stay (HOSPITAL_COMMUNITY): Payer: Medicaid Other | Admitting: Speech Pathology

## 2016-06-12 ENCOUNTER — Inpatient Hospital Stay (HOSPITAL_COMMUNITY): Payer: Medicaid Other | Admitting: Occupational Therapy

## 2016-06-12 ENCOUNTER — Inpatient Hospital Stay (HOSPITAL_COMMUNITY): Payer: Medicaid Other | Admitting: Physical Therapy

## 2016-06-12 DIAGNOSIS — R05 Cough: Secondary | ICD-10-CM

## 2016-06-12 DIAGNOSIS — I613 Nontraumatic intracerebral hemorrhage in brain stem: Secondary | ICD-10-CM

## 2016-06-12 LAB — COMPREHENSIVE METABOLIC PANEL
ALT: 11 U/L — AB (ref 14–54)
AST: 16 U/L (ref 15–41)
Albumin: 1.8 g/dL — ABNORMAL LOW (ref 3.5–5.0)
Alkaline Phosphatase: 33 U/L — ABNORMAL LOW (ref 38–126)
Anion gap: 8 (ref 5–15)
BUN: 8 mg/dL (ref 6–20)
CALCIUM: 8.4 mg/dL — AB (ref 8.9–10.3)
CHLORIDE: 106 mmol/L (ref 101–111)
CO2: 24 mmol/L (ref 22–32)
CREATININE: 1.16 mg/dL — AB (ref 0.44–1.00)
GFR, EST AFRICAN AMERICAN: 59 mL/min — AB (ref >60)
GFR, EST NON AFRICAN AMERICAN: 50 mL/min — AB (ref >60)
Glucose, Bld: 111 mg/dL — ABNORMAL HIGH (ref 65–99)
Potassium: 3.5 mmol/L (ref 3.5–5.1)
Sodium: 138 mmol/L (ref 135–145)
Total Bilirubin: 0.2 mg/dL — ABNORMAL LOW (ref 0.3–1.2)
Total Protein: 5.9 g/dL — ABNORMAL LOW (ref 6.5–8.1)

## 2016-06-12 LAB — GLUCOSE, CAPILLARY
GLUCOSE-CAPILLARY: 63 mg/dL — AB (ref 65–99)
GLUCOSE-CAPILLARY: 73 mg/dL (ref 65–99)
GLUCOSE-CAPILLARY: 150 mg/dL — AB (ref 65–99)
GLUCOSE-CAPILLARY: 173 mg/dL — AB (ref 65–99)
Glucose-Capillary: 101 mg/dL — ABNORMAL HIGH (ref 65–99)
Glucose-Capillary: 129 mg/dL — ABNORMAL HIGH (ref 65–99)
Glucose-Capillary: 200 mg/dL — ABNORMAL HIGH (ref 65–99)

## 2016-06-12 LAB — CBC WITH DIFFERENTIAL/PLATELET
Basophils Absolute: 0 10*3/uL (ref 0.0–0.1)
Basophils Relative: 0 %
EOS PCT: 3 %
Eosinophils Absolute: 0.4 10*3/uL (ref 0.0–0.7)
HCT: 40.7 % (ref 36.0–46.0)
Hemoglobin: 13.5 g/dL (ref 12.0–15.0)
LYMPHS ABS: 3 10*3/uL (ref 0.7–4.0)
LYMPHS PCT: 26 %
MCH: 31.5 pg (ref 26.0–34.0)
MCHC: 33.2 g/dL (ref 30.0–36.0)
MCV: 95.1 fL (ref 78.0–100.0)
MONO ABS: 1 10*3/uL (ref 0.1–1.0)
MONOS PCT: 9 %
Neutro Abs: 7.1 10*3/uL (ref 1.7–7.7)
Neutrophils Relative %: 62 %
PLATELETS: 246 10*3/uL (ref 150–400)
RBC: 4.28 MIL/uL (ref 3.87–5.11)
RDW: 14.5 % (ref 11.5–15.5)
WBC: 11.4 10*3/uL — AB (ref 4.0–10.5)

## 2016-06-12 MED ORDER — METOPROLOL TARTRATE 12.5 MG HALF TABLET
12.5000 mg | ORAL_TABLET | Freq: Two times a day (BID) | ORAL | Status: DC
  Administered 2016-06-12 – 2016-06-14 (×5): 12.5 mg via ORAL
  Filled 2016-06-12 (×5): qty 1

## 2016-06-12 MED ORDER — GUAIFENESIN ER 600 MG PO TB12
600.0000 mg | ORAL_TABLET | Freq: Two times a day (BID) | ORAL | Status: DC
  Administered 2016-06-12 – 2016-06-20 (×16): 600 mg via ORAL
  Filled 2016-06-12 (×17): qty 1

## 2016-06-12 NOTE — IPOC Note (Signed)
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[name])     Functional Problem List: Nursing Medication Management, Pain, Safety, Skin Integrity, Bladder  PT Balance, Endurance, Motor, Safety  OT Balance, Safety, Cognition, Vision, Endurance, Motor  SLP Cognition  TR         Basic ADL's: OT Bathing, Dressing, Toileting     Advanced  ADL's: OT Simple Meal Preparation, Laundry, Light Housekeeping     Transfers: PT Bed Mobility, Bed to Chair, Car, Furniture, Floor  OT Toilet, Tub/Shower     Locomotion: PT Ambulation, Emergency planning/management officer, Stairs     Additional Impairments: OT Fuctional Use of Upper Extremity  SLP Swallowing      TR      Anticipated Outcomes Item Anticipated Outcome  Self Feeding N/A  Swallowing  Mod I   Basic self-care  Mod I   Toileting  Mod I    Bathroom Transfers Mod I   Bowel/Bladder  Mod assist with bladder, continent of bowel  Transfers  mod I with LRAD   Locomotion  Mod I with LRAD   Communication     Cognition  Mod I  Pain  < 3  Safety/Judgment  min assist   Therapy Plan: PT Intensity: Minimum of 1-2 x/day ,45 to 90 minutes PT Frequency: 5 out of 7 days PT Duration Estimated Length of Stay: 7-10  OT Intensity: Minimum of 1-2 x/day, 45 to 90 minutes OT Frequency: 5 out of 7 days OT Duration/Estimated Length of Stay: 8-10 days SLP Intensity: Minumum of 1-2 x/day, 30 to 90 minutes SLP Frequency: 3 to 5 out of 7 days       Team Interventions: Nursing Interventions Patient/Family Education, Disease Management/Prevention, Pain Management, Medication Management, Skin Care/Wound Management, Discharge Planning, Psychosocial Support, Bladder Management  PT interventions  Ambulation/gait training, Training and development officer, Community reintegration, Discharge planning, Disease management/prevention, DME/adaptive equipment instruction, Functional mobility training, Neuromuscular re-education, Patient/family education, Psychosocial support, Skin care/wound management, Stair training, Therapeutic Activities, Therapeutic Exercise, UE/LE Strength taining/ROM, UE/LE Coordination activities, Visual/perceptual remediation/compensation, Wheelchair propulsion/positioning  OT Interventions Discharge planning, Self Care/advanced ADL retraining, Therapeutic Activities, UE/LE Coordination activities, Functional mobility training, Patient/family education, Therapeutic Exercise, Cognitive remediation/compensation, DME/adaptive equipment instruction, Psychosocial support, UE/LE Strength taining/ROM  SLP Interventions Functional tasks, Environmental controls, Dysphagia/aspiration precaution training, Patient/family education, Therapeutic Exercise, Therapeutic Activities, Cognitive remediation/compensation  TR Interventions    SW/CM Interventions Discharge Planning, Psychosocial Support, Patient/Family Education, Disease Management/Prevention    Team Discharge Planning: Destination: PT-Home ,OT- Home , SLP-Home Projected Follow-up: PT-Home health PT, OT-  Home health OT, Outpatient OT, SLP-Other (comment) (TBD) Projected Equipment Needs: PT-To be determined, Rolling walker with 5" wheels, OT- To be determined, SLP-  Equipment Details: PT- , OT-  Patient/family involved in discharge planning: PT- Patient,  OT-Patient, SLP-Patient  MD ELOS: 7-10 days Medical Rehab Prognosis:  Excellent Assessment: The patient has been admitted for CIR therapies with the diagnosis of left pontine hemorrhage. The team will be addressing functional mobility, strength, stamina, balance, safety, adaptive techniques and equipment, self-care, bowel and bladder mgt, patient and caregiver education, NMR,  swallowing, speech, cognition, breathing techniques, community reintegration, ego support. Goals have been set at mod I for basic mobility and self-care, mod I for swallowing and communication.    Meredith Staggers, MD, Mellody Drown  See Team Conference Notes for weekly updates to the plan of care

## 2016-06-12 NOTE — Progress Notes (Signed)
Speech Language Pathology Daily Session Note  Patient Details  Name: [name] MRN: [id number] Date of Birth: 12/02/1955  Today's Date: 06/12/2016 SLP Individual Time: 1030-1130 SLP Individual Time Calculation (min): 60 min   Short Term Goals: Week 1: SLP Short Term Goal 1 (Week 1): Pt will utilize external memory aids to recall new, daily information with Min A verbal and question cues.  SLP Short Term Goal 2 (Week 1): Pt will consume tirals of thin liquids with minimal overt s/s of aspiration over 2 sessions to asssess readiness for repeat MBS with supervision verbal cues.  SLP Short Term Goal 3 (Week 1): Pt will consume dysphagia 2 diet with no overt s/s of aspiration over 3 sessions to assess readiness for diet upgrade with supervision verbal cues.   Skilled Therapeutic Interventions: Skilled treatment session focused on dysphagia and cognitive goals. Upon arrival, patient was sitting up in the wheelchair and had a wet vocal quality that cleared with cued throat clears. SLP administered trials of ice chips after oral care. Patient demonstrated intermittent wet vocal quality that cleared with spontaneous throat clears. Patient asking questions in regards to current swallowing function and SLP provided extensive education including showing the patient her most recent MBS. Patient verbalized understanding. Patient requested to use the ATM and independently recalled all of her personal information to complete transaction successfully. Patient also requested to use the bathroom and was Mod I for safety with task. Patient left upright in wheelchair with quick release belt in place and all needs within reach. Continue with current plan of care.  Function:  Eating Eating   Modified Consistency Diet: Yes Eating Assist Level: Supervision or verbal cues           Cognition Comprehension Comprehension assist level: Follows basic conversation/direction with extra time/assistive device   Expression   Expression assist level: Expresses basic 90% of the time/requires cueing < 10% of the time.  Social Interaction Social Interaction assist level: Interacts appropriately with others - No medications needed.  Problem Solving Problem solving assist level: Solves complex 90% of the time/cues < 10% of the time  Memory Memory assist level: More than reasonable amount of time    Pain No/Denies Pain   Therapy/Group: Individual Therapy  ,  06/12/2016, 12:42 PM   

## 2016-06-12 NOTE — Progress Notes (Signed)
Occupational Therapy Session Note  Patient Details  Name: [name] MRN: [id number] Date of Birth: 1956-02-27  Today's Date: 06/12/2016 OT Individual Time: [id number] OT Individual Time Calculation (min): 58 min     Short Term Goals: Week 1:  OT Short Term Goal 1 (Week 1): Pt will complete bathing with supervision  OT Short Term Goal 2 (Week 1): Pt will complete toilet transfer with LRAD and consistent Min A OT Short Term Goal 3 (Week 1): Pt will complete LB dressing with Min A sit<stand OT Short Term Goal 4 (Week 1): Pt will complete dynamic standing task for 4 minutes without rest breaks for improving balance during ADLs  Skilled Therapeutic Interventions/Progress Updates:     Upon entering the room, RN present finishing giving medications. Pt requesting to shower this session. IV covered for safety. Pt ambulating without use of AD to bathroom with steady assistance for balance. Pt performing toilet transfer, hygiene, and clothing management with steady assistance. Pt ambulating to shower for bathing at shower level with sit <>stand from TTB utilizing grab bar to stand with steady assistance for safety. Pt requiring min verbal cues for safety awareness this session. Pt utilizing R UE for bathing tasks without cues to do so. Pt ambulated with hand held assistance to wheelchair for dressing tasks. Pt seated in wheelchair with quick release belt donned and call [name] within reach.    Therapy Documentation Precautions:  Precautions Precautions: Fall Precaution Comments:  (Monitor BP) Restrictions Weight Bearing Restrictions: No General:   Vital Signs: Therapy Vitals Pulse Rate: 96 BP: (!) 164/75 Pain:   ADL: ADL ADL Comments: Please see functional navigator for ADL status Exercises:   Other Treatments:    See Function Navigator for Current Functional Status.   Therapy/Group: Individual Therapy  Gypsy Decant 06/12/2016, 12:50 PM

## 2016-06-12 NOTE — Progress Notes (Addendum)
Physical Therapy Session Note  Patient Details  Name: [name] MRN: [id number] Date of Birth: 1956-05-26  Today's Date: 06/12/2016 PT Individual Time: 1305-1400 PT Individual Time Calculation (min): 55 min    Short Term Goals:Week 1:  PT Short Term Goal 1 (Week 1): STG= LTG due to ELOS  Skilled Therapeutic Interventions/Progress Updates:  Pt received in w/c & agreeable to tx but noting 8/10 thigh & BUE soreness - pain meds requested. Pt requested to use restroom, (+) void with pt performing peri hygiene and clothing management with min assist for overall balance. During session pt ambulated throughout unit without AD & min assist 2/2 inconsistent step length & width with lateral sway. Pt performed static standing without BUE support while matching cards with task focusing on balance (normal & narrow BOS), RUE neuro re-ed, and R attention. Pt completed sit<>stand transfers without BUE and min assist for BLE strengthening. Pt performed supine bridging with adductor hold with cuing for technique & breathing, focusing on BLE strengthening & neuro re-ed. Stair training completed with B rails x 12 steps (6" & 3") with min assist & pt self selecting reciprocal pattern with pt demonstrating poor eccentric control BLE. Ambulation trial with [facility] but pt with difficulty demonstrating proper sequence and did not safely place AD flat on floor. Pt ambulated back to room without AD & transferred into bed; pt left supine in bed with alarm set & all needs within reach.   BP at beginning of session = 165/79 mmHg.  Therapy Documentation Precautions:  Precautions Precautions: Fall Precaution Comments:  (Monitor BP) Restrictions Weight Bearing Restrictions: No   See Function Navigator for Current Functional Status.   Therapy/Group: Individual Therapy  Waunita Schooner 06/12/2016, 4:47 PM

## 2016-06-12 NOTE — Progress Notes (Addendum)
Speech Language Pathology Daily Session Note  Patient Details  Name: [name] MRN: [id number] Date of Birth: July 25, 1955  Today's Date: 06/12/2016 SLP Individual Time: 1500-1530 SLP Individual Time Calculation (min): 30 min   Short Term Goals: Week 1: SLP Short Term Goal 1 (Week 1): Pt will utilize external memory aids to recall new, daily information with Min A verbal and question cues.  SLP Short Term Goal 2 (Week 1): Pt will consume tirals of thin liquids with minimal overt s/s of aspiration over 2 sessions to asssess readiness for repeat MBS with supervision verbal cues.  SLP Short Term Goal 3 (Week 1): Pt will consume dysphagia 2 diet with no overt s/s of aspiration over 3 sessions to assess readiness for diet upgrade with supervision verbal cues.   Skilled Therapeutic Interventions:Skilled treatment session focused on addressing dysphagia and cognition goals. Per RN report patient with recent coughing episode following medication administration.  As a result, SLP facilitated session by providing skilled observation of nectar-thick liquids via cup.  Patient consumed trials with no initial overt s/s of aspiration; however intermittent burps resulted in wet vocal quality with throat clears and or coughs.  Patient reports a history of reflux.  SLP spoke with patient and RN regarding discussing increase in dose or frequency with medical team.  Recommend patient continue with current plan of care and sit up during and after meals.  SLP also facilitated session with Min verbal cues for slow and safe transfer sequence with toileting to maximize safety despite urgency.      Function:  Eating Eating   Modified Consistency Diet: Yes Eating Assist Level: Supervision or verbal cues           Cognition Comprehension Comprehension assist level: Follows basic conversation/direction with extra time/assistive device  Expression   Expression assist level: Expresses basic 90% of the  time/requires cueing < 10% of the time.  Social Interaction Social Interaction assist level: Interacts appropriately with others - No medications needed.  Problem Solving Problem solving assist level: Solves complex 90% of the time/cues < 10% of the time  Memory Memory assist level: More than reasonable amount of time    Pain Pain Assessment Pain Assessment: No/denies pain  Therapy/Group: Individual Therapy  Carmelia Roller., CCC-SLP [id number]  [place] 06/12/2016, 2:56 PM

## 2016-06-12 NOTE — Progress Notes (Signed)
[facility] PHYSICAL MEDICINE & REHABILITATION     PROGRESS NOTE    Subjective/Complaints: Having coughing. Using suction in bed. Denies shortness of breath. Says coughing is from all her years of smoking.'  ROS: pt denies nausea, vomiting, diarrhea,  shortness of breath or chest pain   Objective: Vital Signs: Blood pressure (!) 175/90, pulse 89, temperature 98.6 F (37 C), temperature source Oral, resp. rate 18, height 5\' 3"  (1.6 m), weight 98 kg (216 lb 0.8 oz), SpO2 94 %. Dg Chest Port 1 View  Result Date: 06/10/2016 CLINICAL DATA:  Fever and productive cough. EXAM: PORTABLE CHEST 1 VIEW COMPARISON:  06/06/2016. FINDINGS: Decreased inspiration with no gross change in borderline cardiomegaly and mildly prominent pulmonary vasculature. Crowding of the interstitial markings. Mild right basilar atelectasis. Thoracic spine degenerative changes. IMPRESSION: 1. Decreased inspiration with mild right basilar atelectasis. 2. Stable borderline cardiomegaly and mild pulmonary vascular congestion. Electronically Signed   By: Claudie Revering M.D.   On: 06/10/2016 16:24    Recent Labs  06/12/16 0559  WBC 11.4*  HGB 13.5  HCT 40.7  PLT 246    Recent Labs  06/10/16 0521 06/12/16 0559  NA 140 138  K 3.9 3.5  CL 108 106  GLUCOSE 263* 111*  BUN 7 8  CREATININE 1.35* 1.16*  CALCIUM 8.3* 8.4*   CBG (last 3)   Recent Labs  06/12/16 0201 06/12/16 0209 06/12/16 0636  GLUCAP 73 101* 129*    Wt Readings from Last 3 Encounters:  06/09/16 98 kg (216 lb 0.8 oz)  06/07/16 93.4 kg (205 lb 14.6 oz)  01/14/16 101.2 kg (223 lb)    Physical Exam:  Constitutional: She appears well-developed.  Eyes: EOM are normal.  Pupils round and reactive to light  Neck: suppple.  Cardiovascular: Reg rhythm. Slight tachy.  Respiratory: productive cough, +rhonchi and crackles at bases  GI: Soft. Bowel sounds are normal. She exhibits no distension.  Neurological: She is alert.  Alert. Speech is  dysarthric but intelligible. Reasonable insight and awareness. Follows all commands. . Right central 7. Marland KitchenRUE remains 3-4/5 bicep, tricep, wrist, hand. RLE 4/5 prox to distal. LUE 5/5. LLE 5/5. No sensory findings  Skin: Skin is warm and dry.  Psychiatric: cooperative   Assessment/Plan: 1. 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 Physiatrist is providing close team supervision and 24 hour management of active medical problems listed below. Physiatrist and rehab team continue to assess barriers to discharge/monitor patient progress toward functional and medical goals.  Function:  Bathing Bathing position      Bathing parts      Bathing assist        Upper Body Dressing/Undressing Upper body dressing   What is the patient wearing?: Hospital gown                Upper body assist Assist Level: More than reasonable time      Lower Body Dressing/Undressing Lower body dressing   What is the patient wearing?: Hospital Gown                              Lower body assist        Toileting Toileting   Toileting steps completed by patient: Adjust clothing prior to toileting, Adjust clothing after toileting Toileting steps completed by helper: Adjust clothing prior to toileting, Adjust  clothing after toileting, Performs perineal hygiene    Toileting assist Assist level: Touching or steadying assistance (Pt.75%)   Transfers Chair/bed transfer     Chair/bed transfer assist level: Touching or steadying assistance (Pt > 75%) Chair/bed transfer assistive device: Armrests     Locomotion Ambulation     Max distance: 254ft  Assist level: Touching or steadying assistance (Pt > 75%)   Wheelchair   Type: Manual Max wheelchair distance: 180ft  Assist Level: Supervision or verbal cues  Cognition Comprehension Comprehension  assist level: Follows basic conversation/direction with extra time/assistive device  Expression Expression assist level: Expresses basic needs/ideas: With extra time/assistive device  Social Interaction Social Interaction assist level: Interacts appropriately with others - No medications needed.  Problem Solving Problem solving assist level: Solves complex 90% of the time/cues < 10% of the time  Memory Memory assist level: More than reasonable amount of time   Medical Problem List and Plan:  1. Right hemiparesthesias and dysarthria /dysphagia secondary to left pontine hemorrhage secondary to hypertensive crisis  -continue inpatient rehab  2. DVT Prophylaxis/Anticoagulation: SCDs. Monitor for any signs of DVT  3. Pain Management/chronic back pain: Ultram 50 mg daily as needed as well as Flexeril. Patient had been on Neurontin 600 mg 4 times a day, hydrocodone as needed prior to admission.  4. Dysphagia. Dysphagia #2 nectar thick liquids.   -dc HS IVF (see #10)  - Follow-up speech therapy  5. Neuropsych: This patient is capable of making decisions on her own behalf.  6. Skin/Wound Care: Routine skin checks  7. Fluids/Electrolytes/Nutrition: Routine I&O with follow-up chemistries  8. Diabetes mellitus with peripheral neuropathy. Hemoglobin A1c 11.4. Lantus insulin 7 units daily at bedtime. Check blood sugars before meals and at bedtime.   -need to avoid overtreating high cbg's  9. Seizure prophylaxis. Keppra 1000 mg every 12 hours. EEG negative  10. Hypertension. Lisinopril 20 mg daily, HCTZ 12.5 mg daily.   -some improvement---avoid over-treating---  -dc hctz due to nectar fluids/CRI---dc IVF  -begin trial of metoprolol  11. Hyperlipidemia. Patient on Zocor 10 mg daily at bedtime prior to admission.  12. Tobacco/cocaine abuse. Nicoderm patch. Provide counseling  13. Hypokalemia. Potassium supplement and follow-up chemistries all personally reviewed 14. Question acute on chronic renal  insufficiency. Baseline creatinine 1.74 on admission his creatinine 1.38. Cr stable to improved 15. Wet/productive cough---recent cxr with atelectasis reviewed-   -scheduled mucinex  -encouraged coughing  -FV/IS  LOS (Days) 3 A FACE TO FACE EVALUATION WAS PERFORMED  Madisin Hasan T 06/12/2016 9:55 AM

## 2016-06-12 NOTE — Significant Event (Signed)
Hypoglycemic Event  CBG: 63  Treatment: 15 GM carbohydrate snack  Symptoms: None  Follow-up CBG: Time:0200 CBG Result:73  Possible Reasons for Event: Unknown  Comments/MD notified:Pt asymptomatic, no other symptoms noted    Stefani Baik, Dione Plover

## 2016-06-12 NOTE — Progress Notes (Signed)
Patient information reviewed and entered into eRehab system by Ketrina Boateng, RN, CRRN, PPS Coordinator.  Information including medical coding and functional independence measure will be reviewed and updated through discharge.    

## 2016-06-12 NOTE — Progress Notes (Signed)
Social Work Assessment and Plan  Patient Details  Name: [name] MRN: [id number] Date of Birth: 1955/09/06  Today's Date: 06/12/2016  Problem List:  Patient Active Problem List   Diagnosis Date Noted  . Right hemiparesis ([place]) 06/09/2016  . 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 Cytotoxic brain edema ([place]) 06/08/2016  . ICH (intracerebral hemorrhage) ([place]) 06/06/2016  . Chest pain, musculoskeletal 05/16/2011  . Vaginal candidiasis 05/16/2011  . Hypertension 05/16/2011  . Diabetes mellitus, type 2 ([place]) 05/16/2011  . CHEST PAIN 10/01/2009  . CELLULITIS AND ABSCESS OF LEG EXCEPT FOOT 08/31/2009  . INSOMNIA 07/13/2008  . MRSA 04/17/2008  . NECK MASS 04/17/2008  . COCAINE ABUSE 11/01/2007  . OBESITY NOS 07/14/2006  . DENTAL CARIES 07/14/2006  . DIABETES MELLITUS, TYPE II 04/26/2006  . ALCOHOL ABUSE 04/26/2006  . TOBACCO ABUSE 04/26/2006  . DEPRESSION 04/26/2006  . Essential hypertension 04/26/2006  . GERD 04/26/2006  . LOW BACK PAIN 04/26/2006   Past Medical History:  Past Medical History:  Diagnosis Date  . Diabetes mellitus    Hb A1C = 12.6 on 05/15/11, managed on Novolog 70/30, 35 U qam, 25 U qpm  . Hypertension    poorly controlled  . Shortness of breath    Past Surgical History:  Past Surgical History:  Procedure Laterality Date  . ANKLE FRACTURE SURGERY  2007   Social History:  reports that she has been smoking.  She has a 12.50 pack-year smoking history. She has never used smokeless tobacco. She reports that she drinks about 7.2 oz of alcohol per week . She reports that she uses drugs, including Cocaine.  Family / Support Systems Marital Status: Single (has been with boyfriend for 38 years) Patient Roles: Partner, Parent, Other (Comment) (sister, mother, grandmother) Spouse/Significant Other: Ok Edwards - boyfriend - [phone] Other Supports: Ishmael Holter - friend - [phone] Anticipated Caregiver: self Ability/Limitations of  Caregiver: Common law husband has caregivers M-F 11 am to 1 pm. Caregiver Availability: Intermittent (friend can help some) Family Dynamics: close friends  Social History Preferred language: English Religion: Baptist Read: Yes Write: Yes Employment Status: Unemployed (receives SSI; did not qualify for SSD due to not enough work credits in the system) Public relations account executive Issues: none reported Guardian/Conservator: N/A - MD has determined that pt is capable of making her own decisions.   Abuse/Neglect Physical Abuse: Denies Verbal Abuse: Denies Sexual Abuse: Denies Exploitation of patient/patient's resources: Denies Self-Neglect: Denies  Emotional Status Pt's affect, behavior and adjustment status: Pt is positive that she will recover and it motivated to do so. Recent Psychosocial Issues: Pt cares for her common law husband following his cardiac surgery. Psychiatric History: none reported, but chart lists depression Substance Abuse History: Pt admits to cocaine use.  Quit years ago and did not use for 3 years.  She stated she does not need/want resources to stop using cocaine.  Patient / Family Perceptions, Expectations & Goals Pt/Family understanding of illness & functional limitations: Pt understands that in some part that her condition is attributed to her lifestyle choices. Premorbid pt/family roles/activities: Pt enjoys cooking. Anticipated changes in roles/activities/participation: Pt hopes to resume cooking as she is able. Pt/family expectations/goals: Pt wants to get home to her "baby" (common law husband).  Community Resources [employer]: Other (Comment) Premorbid Home Care/DME Agencies: None Transportation available at discharge: friends Resource referrals recommended: Neuropsychology, Support group (specify)  Discharge Planning Living Arrangements: Spouse/significant other Support Systems: Spouse/significant other, Children, Other relatives,  Friends/neighbors Type of Residence:  Private residence Insurance Resources: Medicaid (specify county) Sports coach) Financial Resources: SSI Financial Screen Referred: No Living Expenses: Education officer, community Management: Patient Does the patient have any problems obtaining your medications?: No Home Management: Pt was doing this PTA.  Her friend will help with this some. Patient/Family Preliminary Plans: Pt plans to return to her home and to make better choices once home.  Her friend will help her for a little bit after d/c. Barriers to Discharge: Steps, Family Support Social Work Anticipated Follow Up Needs: HH/OP, Support Group Expected length of stay: 7 to 10 days  Clinical Impression CSW met with pt to introduce self and role of CSW, as well as to complete assessment.  Pt was open with CSW and state that she is here because she didn't listen to her friends/family and "did things" they didn't want her to do.  Friends and dtr were asking pt to take it easy on caring for her husband and to not use cocaine.  Pt realizes she should heed their advice, especially now that she has ended up in the hospital/rehab.  Pt is motivated to get better so she can get home and wants to make better decisions.  CSW offered resources for substance abuse and pt declined, stating she can stop using cocaine.  Pt relies on her faith to help her through difficult times.  Pt has some positive people in her life and plans to surround herself with them and not the negative influences.  CSW will continue to follow and assist as needed.  Cameran Ahmed, Silvestre Mesi 06/12/2016, 1:32 PM

## 2016-06-12 NOTE — Progress Notes (Signed)
Inpatient [facility] Individual Statement of Services  Patient Name:  [name]  Date:  06/12/2016  Welcome to the [facility].  Our goal is to provide you with an individualized program based on your diagnosis and situation, designed to meet your specific needs.  With this comprehensive rehabilitation program, you will be expected to participate in at least 3 hours of rehabilitation therapies Monday-Friday, with modified therapy programming on the weekends.  Your rehabilitation program will include the following services:  Physical Therapy (PT), Occupational Therapy (OT), 24 hour per day rehabilitation nursing, Case Management (Social Worker), Rehabilitation Medicine, Nutrition Services and Pharmacy Services  Weekly team conferences will be held on Tuesdays to discuss your progress.  Your Social Worker will talk with you frequently to get your input and to update you on team discussions.  Team conferences with you and your family in attendance may also be held.  Expected length of stay:  7 to 10 days  Overall anticipated outcome:  Modified independent  Depending on your progress and recovery, your program may change. Your Social Worker will coordinate services and will keep you informed of any changes. Your Social Worker's name and contact numbers are listed  below.  The following services may also be recommended but are not provided by the [facility] will be made to provide these services after discharge if needed.  Arrangements include referral to agencies that provide these services.  Your insurance has been verified to be:  Medicaid Your primary doctor is:  Dr. Nolene Ebbs  Pertinent information will be shared with your doctor and your insurance company.  Social Worker:  Alfonse Alpers, LCSW  [phone] or [phone]  Information discussed with and copy given to patient by: Trey Sailors, 06/12/2016, 1:05 PM

## 2016-06-13 ENCOUNTER — Inpatient Hospital Stay (HOSPITAL_COMMUNITY): Payer: Medicaid Other | Admitting: Physical Therapy

## 2016-06-13 ENCOUNTER — Inpatient Hospital Stay (HOSPITAL_COMMUNITY): Payer: Medicaid Other | Admitting: Speech Pathology

## 2016-06-13 ENCOUNTER — Inpatient Hospital Stay (HOSPITAL_COMMUNITY): Payer: Medicaid Other

## 2016-06-13 LAB — GLUCOSE, CAPILLARY
GLUCOSE-CAPILLARY: 88 mg/dL (ref 65–99)
Glucose-Capillary: 264 mg/dL — ABNORMAL HIGH (ref 65–99)
Glucose-Capillary: 295 mg/dL — ABNORMAL HIGH (ref 65–99)
Glucose-Capillary: 265 mg/dL — ABNORMAL HIGH (ref 65–99)

## 2016-06-13 MED ORDER — INSULIN ASPART PROT & ASPART (70-30 MIX) 100 UNIT/ML ~~LOC~~ SUSP
50.0000 [IU] | Freq: Every day | SUBCUTANEOUS | Status: DC
  Administered 2016-06-14 – 2016-06-17 (×4): 50 [IU] via SUBCUTANEOUS
  Filled 2016-06-13: qty 10

## 2016-06-13 MED ORDER — INSULIN ASPART PROT & ASPART (70-30 MIX) 100 UNIT/ML ~~LOC~~ SUSP
30.0000 [IU] | Freq: Every day | SUBCUTANEOUS | Status: DC
  Administered 2016-06-14 – 2016-06-17 (×3): 30 [IU] via SUBCUTANEOUS

## 2016-06-13 NOTE — Progress Notes (Signed)
Speech Language Pathology Daily Session Note  Patient Details  Name: [name] MRN: [id number] Date of Birth: 09/30/1955  Today's Date: 06/13/2016 SLP Individual Time: 0730-0830 SLP Individual Time Calculation (min): 60 min   Short Term Goals: Week 1: SLP Short Term Goal 1 (Week 1): Pt will utilize external memory aids to recall new, daily information with Min A verbal and question cues.  SLP Short Term Goal 2 (Week 1): Pt will consume tirals of thin liquids with minimal overt s/s of aspiration over 2 sessions to asssess readiness for repeat MBS with supervision verbal cues.  SLP Short Term Goal 3 (Week 1): Pt will consume dysphagia 2 diet with no overt s/s of aspiration over 3 sessions to assess readiness for diet upgrade with supervision verbal cues.   Skilled Therapeutic Interventions: Skilled treatment session focused on dysphagia and cognitive goals. Upon arrival, patient was asleep and required more than a reasonable amount of time for arousal. SLP facilitated session by providing Min A verbal cues for safety with transfer from bed to wheelchair. Patient consumed breakfast meal of Dys. 2 textures with nectar-thick liquids without overt cough X 1 and required Min A verbal cues for use of swallowing compensatory strategies. Patient also demonstrated 2 large coughing episodes towards end of session which did not appear to be related to her swallowing function. Patient requested to use the bathroom and required supervision verbal cues for safety with task. Patient left upright in wheelchair with all needs within reach and quick release belt in place. Continue with current plan of care.   Function:  Eating Eating   Modified Consistency Diet: Yes Eating Assist Level: Supervision or verbal cues;More than reasonable amount of time           Cognition Comprehension Comprehension assist level: Follows basic conversation/direction with extra time/assistive device  Expression    Expression assist level: Expresses basic 90% of the time/requires cueing < 10% of the time.  Social Interaction Social Interaction assist level: Interacts appropriately with others - No medications needed.  Problem Solving Problem solving assist level: Solves complex 90% of the time/cues < 10% of the time  Memory Memory assist level: More than reasonable amount of time    Pain No/Denies Pain   Therapy/Group: Individual Therapy  Oliviah Agostini 06/13/2016, 3:26 PM

## 2016-06-13 NOTE — Progress Notes (Signed)
Occupational Therapy Session Note  Patient Details  Name: [name] MRN: [id number] Date of Birth: 04-Apr-1956  Today's Date: 06/13/2016 OT Individual Time: 1000-1100 OT Individual Time Calculation (min): 60 min     Short Term Goals: Week 1:  OT Short Term Goal 1 (Week 1): Pt will complete bathing with supervision  OT Short Term Goal 2 (Week 1): Pt will complete toilet transfer with LRAD and consistent Min A OT Short Term Goal 3 (Week 1): Pt will complete LB dressing with Min A sit<stand OT Short Term Goal 4 (Week 1): Pt will complete dynamic standing task for 4 minutes without rest breaks for improving balance during ADLs  Skilled Therapeutic Interventions/Progress Updates:    Pt asleep in bed upon arrival and required mod multimodal cues to arouse.  Pt initially required mod A for standing balance and amb with HHA.  Pt engaged in BADL retraining including bathing at shower level and dressing with sit<>stand from seat.  Pt required steady A for standing balance throughout session.  Focus on activity tolerance, sit<>stand, standing balance, functional amb with HHA, and safety awareness to increase independence with BADLs.  Therapy Documentation Precautions:  Precautions Precautions: Fall Precaution Comments:  (Monitor BP) Restrictions Weight Bearing Restrictions: No  Pain:  Pt denied pain ADL: ADL ADL Comments: Please see functional navigator for ADL status  See Function Navigator for Current Functional Status.   Therapy/Group: Individual Therapy  Leroy Libman 06/13/2016, 12:17 PM

## 2016-06-13 NOTE — Progress Notes (Addendum)
[facility] PHYSICAL MEDICINE & REHABILITATION     PROGRESS NOTE    Subjective/Complaints: Up in chair. Coughing better than yesterday. Feels that mucinex helped. Eating breakfast with SLP feeding self with right hand.   ROS: Pt denies fever, rash/itching, headache, blurred or double vision, nausea, vomiting, abdominal pain, diarrhea, chest pain, shortness of breath, palpitations, dysuria, dizziness, neck pain, back pain, bleeding, anxiety, or depression   Objective: Vital Signs: Blood pressure (!) 163/81, pulse 88, temperature 98.2 F (36.8 C), temperature source Oral, resp. rate 20, height 5\' 3"  (1.6 m), weight 98 kg (216 lb 0.8 oz), SpO2 96 %. No results found.  Recent Labs  06/12/16 0559  WBC 11.4*  HGB 13.5  HCT 40.7  PLT 246    Recent Labs  06/12/16 0559  NA 138  K 3.5  CL 106  GLUCOSE 111*  BUN 8  CREATININE 1.16*  CALCIUM 8.4*   CBG (last 3)   Recent Labs  06/12/16 1648 06/12/16 2104 06/13/16 0704  GLUCAP 200* 173* 88    Wt Readings from Last 3 Encounters:  06/09/16 98 kg (216 lb 0.8 oz)  06/07/16 93.4 kg (205 lb 14.6 oz)  01/14/16 101.2 kg (223 lb)    Physical Exam:  Constitutional: She appears well-developed.  Eyes: PERRL.    Neck: suppple.  Cardiovascular: RRR Respiratory: no cough. Normal effort. GI: Soft.   Neurological: She is alert.  Alert. Speech is still dysarthric. Reasonable insight and awareness. Follows all commands. . Right central 7. Marland KitchenRUE remains 3-4/5 bicep, tricep, wrist, hand. RLE 4/5 prox to distal. LUE 5/5. LLE 5/5. No sensory findings  Skin: Skin is warm and dry.  Psychiatric: flat but cooperative  Assessment/Plan: 1. 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 Physiatrist is providing close team supervision and 24 hour management of active medical problems listed below. Physiatrist  and rehab team continue to assess barriers to discharge/monitor patient progress toward functional and medical goals.  Function:  Bathing Bathing position   Position: Shower  Bathing parts Body parts bathed by patient: Right arm, Left arm, Chest, Abdomen, Front perineal area, Buttocks, Right upper leg, Left upper leg, Right lower leg, Left lower leg Body parts bathed by helper: Back  Bathing assist Assist Level: Touching or steadying assistance(Pt > 75%)      Upper Body Dressing/Undressing Upper body dressing   What is the patient wearing?: Pull over shirt/dress     Pull over shirt/dress - Perfomed by patient: Thread/unthread right sleeve, Thread/unthread left sleeve, Put head through opening, Pull shirt over trunk          Upper body assist Assist Level: Set up      Lower Body Dressing/Undressing Lower body dressing   What is the patient wearing?: Pants, Non-skid slipper socks, Shoes     Pants- Performed by patient: Thread/unthread right pants leg, Thread/unthread left pants leg Pants- Performed by helper: Pull pants up/down   Non-skid slipper socks- Performed by helper: Don/doff right sock, Don/doff left sock       Shoes - Performed by helper: Don/doff right shoe, Don/doff left shoe          Lower body assist Assist for lower body dressing:  (max A)      Toileting Toileting   Toileting steps completed by patient: Adjust clothing prior to toileting, Performs perineal hygiene, Adjust clothing after toileting Toileting steps completed by helper: Adjust  clothing prior to toileting, Adjust clothing after toileting, Performs perineal hygiene    Toileting assist Assist level: Touching or steadying assistance (Pt.75%)   Transfers Chair/bed transfer   Chair/bed transfer method: Ambulatory Chair/bed transfer assist level: Touching or steadying assistance (Pt > 75%) Chair/bed transfer assistive device: Armrests     Locomotion Ambulation     Max distance: 150  ft Assist level: Touching or steadying assistance (Pt > 75%)   Wheelchair   Type: Manual Max wheelchair distance: 158ft  Assist Level: Supervision or verbal cues  Cognition Comprehension Comprehension assist level: Follows basic conversation/direction with extra time/assistive device  Expression Expression assist level: Expresses basic 90% of the time/requires cueing < 10% of the time.  Social Interaction Social Interaction assist level: Interacts appropriately with others - No medications needed.  Problem Solving Problem solving assist level: Solves complex 90% of the time/cues < 10% of the time  Memory Memory assist level: More than reasonable amount of time   Medical Problem List and Plan:  1. Right hemiparesthesias and dysarthria /dysphagia secondary to left pontine hemorrhage secondary to hypertensive crisis  -team conference today  2. DVT Prophylaxis/Anticoagulation: SCDs. Monitor for any signs of DVT  3. Pain Management/chronic back pain: Ultram 50 mg daily as needed as well as Flexeril. Patient had been on Neurontin 600 mg 4 times a day, hydrocodone as needed prior to admission.  4. Dysphagia. Dysphagia #2 nectar thick liquids.   -off IVF  - Follow-up speech therapy  5. Neuropsych: This patient is capable of making decisions on her own behalf.  6. Skin/Wound Care: Routine skin checks  7. Fluids/Electrolytes/Nutrition: Routine I&O with follow-up chemistries  8. Diabetes mellitus with peripheral neuropathy. Hemoglobin A1c 11.4.  -will change to 70/30 to 50u am and 30u pm to avoid morning lows  -need to avoid overtreating high cbg's  9. Seizure prophylaxis. Keppra 1000 mg every 12 hours. EEG negative  10. Hypertension. Lisinopril 20 mg daily, HCTZ 12.5 mg daily.   -some improvement---avoid over-treating---  -stopped hctz due to nectar fluids/CRI  -began trial of metoprolol with reasonable results so far 11. Hyperlipidemia. Patient on Zocor 10 mg daily at bedtime prior to  admission.  12. Tobacco/cocaine abuse. Nicoderm patch. Provide counseling  13. Hypokalemia. Potassium supplement   14. Question acute on chronic renal insufficiency. Baseline creatinine 1.74 on admission his creatinine 1.38. Cr stable to improved 15. Wet/productive cough---recent cxr with atelectasis    -scheduled mucinex helpful  -encouraged coughing, swallowing strategies per SLP  -was smoking up until this admission  -FV/IS  LOS (Days) 4 A FACE TO FACE EVALUATION WAS PERFORMED  [name] T 06/13/2016 8:43 AM

## 2016-06-13 NOTE — Progress Notes (Signed)
Physical Therapy Note  Patient Details  Name: Rajah Sera MRN: [id number] Date of Birth: 08/23/1955 Today's Date: 06/13/2016    Time 1: 1100-1155 55 minutes  1:1 No c/o pain. Pt c/o fatigue but willing to participate. Gait in controlled environment multiple reps with min A, wide BOS, LOB when distracted and when more fatigued requiring min A to correct. Gait with obstacle negotiation with pt frequently hitting objects with Rt foot, needs min A for balance and cues for attention to obstacles.  Standing balance on foam with UE peg task with pt able to use bilat UEs for task with cues, min A for balance on foam.  Nu step for endurance training, pt only able to tolerate 5 minutes before fatigued.  Bathroom mobility and toilet transfers with min A for balance and safety.  Time 2: 1300-1326 26 minutes  1:1 No c/o pain. Gait in bathroom without AD with min A, toilet transfers with use of grab  Bar with supervision. Gait training with quad cane and then with RW for trials for more safety at home. Pt able to gait with RW with supervision, occasionally hits Rt foot on RW, requires cues for obstacle negotiation but pt able to self correct LOB when using RW, pt agrees she would like to use RW at home.   Toretto Tingler 06/13/2016, 1:40 PM

## 2016-06-13 NOTE — Progress Notes (Signed)
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 Leonie Man   HPI: [name] is a 60 y.o. right handed female with history of poorly controlled diabetes mellitus, hypertension, tobacco cocaine abuse. Per chart review patient lives with significant other and independent prior to admission. 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 Presented 06/06/2016 with tremors and reported weakness of the left hand and facial droop as well as slurred speech over the past 3-4 days with increasing lethargy as well as a recent fall 2 days prior to admission. SBP 151-202. Urine drug screen positive for cocaine. Troponin negative. 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 While in the CT scan reported seizure and was given Ativan. EEG showed no seizure activity. Placed on nicardipine  drip for blood pressure control. Loaded with Keppra for seizure prophylaxis. Follow-up MRI/MRA showed stable hemorrhage, no related findings on intracranial MRA. Hemoglobin A1c 11.4 with insulin therapy as directed. Currently on a dysphagia #2 nectar thick liquid diet. Physical and occupational therapy evaluation completed 06/07/2016 with recommendations of physical medicine rehabilitation consult.   Review of Systems  Constitutional: Negative for chills and fever.  HENT: Negative for hearing loss and tinnitus.   Eyes: Negative for  blurred vision and double vision.  Respiratory: Positive for cough. Negative for shortness of breath.   Cardiovascular: Negative for chest pain, palpitations and leg swelling.  Gastrointestinal: Positive for constipation, heartburn and nausea. Negative for vomiting.  Genitourinary: Negative for dysuria, flank pain and hematuria.  Musculoskeletal: Positive for back pain, falls and myalgias.  Skin: Negative for rash.  Neurological: Positive for dizziness, tremors, speech change, weakness and headaches. Negative for loss of consciousness.  Psychiatric/Behavioral: The patient has insomnia.   All other systems reviewed and are negative.      Past Medical History:  Diagnosis Date  . Diabetes mellitus    Hb A1C = 12.6 on 05/15/11, managed on Novolog 70/30, 35 U qam, 25 U qpm  . Hypertension    poorly controlled  . Shortness of breath         Past Surgical History:  Procedure Laterality Date  . ANKLE FRACTURE SURGERY  2007   No family history on file. Social History:  reports that she has been smoking.  She has a 12.50 pack-year smoking history. She has never used smokeless tobacco. She reports that she drinks about 7.2 oz of alcohol per week . She reports that she uses drugs, including Cocaine. Allergies: No Known Allergies       Medications Prior to Admission  Medication Sig Dispense Refill  . acyclovir (ZOVIRAX) 400 MG tablet Take 1 tablet (400 mg total) by mouth 4 (four) times daily. 50 tablet 0  . amLODipine (NORVASC) 10 MG tablet Take 10 mg by mouth daily.    . cyclobenzaprine (FLEXERIL) 10 MG tablet Take 1 tablet (10 mg total) by mouth 2 (two) times daily as needed for muscle spasms. 10 tablet 0  .  furosemide (LASIX) 20 MG tablet Take 20 mg by mouth daily as needed (for swollen legs).   0  . gabapentin (NEURONTIN) 300 MG capsule Take 600 mg by mouth 4 (four) times daily.    Marland Kitchen HYDROcodone-acetaminophen (NORCO/VICODIN) 5-325 MG per tablet Take 2 tablets by mouth every 6  (six) hours as needed for pain. 15 tablet 0  . ibuprofen (ADVIL,MOTRIN) 400 MG tablet Take 2 tablets (800 mg total) by mouth every 6 (six) hours as needed. For pain (Patient taking differently: Take 800 mg by mouth every 6 (six) hours as needed (for pain). ) 30 tablet 0  . lisinopril (PRINIVIL,ZESTRIL) 10 MG tablet Take 2 tablets (20 mg total) by mouth daily. (Patient not taking: Reported on 06/06/2016) 60 tablet 0  . lisinopril (PRINIVIL,ZESTRIL) 20 MG tablet Take 1 tablet (20 mg total) by mouth daily. (Patient not taking: Reported on 06/06/2016) 30 tablet 1  . lisinopril-hydrochlorothiazide (PRINZIDE,ZESTORETIC) 20-12.5 MG tablet Take 1 tablet by mouth daily.  0  . NOVOLOG MIX 70/30 FLEXPEN (70-30) 100 UNIT/ML FlexPen Inject 40 Units into the skin 2 (two) times daily. MORNING AND BEDTIME  0  . simvastatin (ZOCOR) 10 MG tablet Take 10 mg by mouth at bedtime.  0  . traMADol (ULTRAM) 50 MG tablet Take 50 mg by mouth daily as needed (for pain).    . traZODone (DESYREL) 100 MG tablet Take 100 mg by mouth at bedtime.  0    Home: Home Living Family/patient expects to be discharged to:: Private residence Living Arrangements: Spouse/significant other Available Help at Discharge: Family Type of Home: House Home Access: [place]: One level Bathroom Shower/Tub: Multimedia programmer: [place]: None (all DME is for boyfriend )  Lives With: Significant other  Functional History: Prior Function Level of Independence: Independent Comments: helping take care of boyfriend that she has to dress and bath. [name] ( boyfriend) RN comes Monday - Friday to help his caregiver Functional Status:  Mobility: Bed Mobility Overal bed mobility: Needs Assistance Bed Mobility: Supine to Sit Supine to sit: Mod assist General bed mobility comments: (A) to elevate trunk from bed surface. bil LE (A) to eob.  Transfers Overall transfer level: Needs assistance Equipment  used: Rolling walker (2 wheeled) Transfers: Sit to/from Stand Sit to Stand: Mod assist Stand pivot transfers: Min assist General transfer comment: Assist to power to standing with pt pulling up on RW. Trembling in BLEs upon standing. SPT to chair with Min A for balance due to bil knee instability.       ADL: ADL Overall ADL's : Needs assistance/impaired Eating/Feeding: Supervision/ safety Eating/Feeding Details (indicate cue type and reason): repeatly asking for juice or ice Grooming: Wash/dry face, Wash/dry hands, Set up Toilet Transfer: Moderate assistance Toileting- Clothing Manipulation and Hygiene: Minimal assistance Toileting - Clothing Manipulation Details (indicate cue type and reason): (A) for steady. standing without (A) requires bil Ue to power off 3n1 General ADL Comments: pt declined 3n1 with therapy but requesting it with RN arrival at end of session. Ot (A)ing with transfer  Cognition: Cognition Overall Cognitive Status: Impaired/Different from baseline Arousal/Alertness: Awake/alert Orientation Level: Oriented X4 Attention: Sustained Sustained Attention: Appears intact Memory: Appears intact (for 4 words) Awareness: Impaired Awareness Impairment: Anticipatory impairment, Emergent impairment Problem Solving: Impaired Problem Solving Impairment: Verbal basic Safety/Judgment:  (questionable) Cognition Arousal/Alertness: Awake/alert Behavior During Therapy: WFL for tasks assessed/performed Overall Cognitive Status: Impaired/Different from baseline Area of Impairment: Awareness, Safety/judgement Memory: Decreased short-term memory Safety/Judgement: Decreased awareness of safety,  Decreased awareness of deficits Awareness: Emergent General Comments: Pt requires cues for safety. pt repeating same questions in session even after being provided an answer  Blood pressure (!) 152/89, pulse 100, temperature 98.1 F (36.7 C), temperature source Oral, resp. rate (!) 27,  height 5\' 3"  (1.6 m), weight 93.4 kg (205 lb 14.6 oz), SpO2 98 %. Physical Exam  Vitals reviewed. Constitutional: She appears well-developed.  HENT:  Head: Normocephalic.  Eyes: EOM are normal.  Neck: Normal range of motion. Neck supple. No thyromegaly present.  Cardiovascular: Normal rate and regular rhythm.   Respiratory: Effort normal and breath sounds normal. No respiratory distress.  GI: Soft. Bowel sounds are normal. She exhibits no distension.  Neurological: She is alert.  Alert and makes good eye contact with examiner. Speech is dysarthric but intelligible. Right central 7. She is able to provide her name, age and date of birth. Follows simple commands.RUE 3-4/5 prox to distal. RLE 4/5 prox to distal. Good sitting balance in chair  Skin: Skin is warm and dry.  Psychiatric: She has a normal mood and affect. Her behavior is normal. Thought content normal.    Lab Results Last 24 Hours       Results for orders placed or performed during the hospital encounter of 06/06/16 (from the past 24 hour(s))  Glucose, capillary     Status: Abnormal   Collection Time: 06/07/16  8:36 AM  Result Value Ref Range   Glucose-Capillary 123 (H) 65 - 99 mg/dL  Basic metabolic panel     Status: Abnormal   Collection Time: 06/07/16 10:53 AM  Result Value Ref Range   Sodium 146 (H) 135 - 145 mmol/L   Potassium 2.8 (L) 3.5 - 5.1 mmol/L   Chloride 114 (H) 101 - 111 mmol/L   CO2 21 (L) 22 - 32 mmol/L   Glucose, Bld 167 (H) 65 - 99 mg/dL   BUN 8 6 - 20 mg/dL   Creatinine, Ser 1.38 (H) 0.44 - 1.00 mg/dL   Calcium 8.4 (L) 8.9 - 10.3 mg/dL   GFR calc non Af Amer 41 (L) >60 mL/min   GFR calc Af Amer 47 (L) >60 mL/min   Anion gap 11 5 - 15  Glucose, capillary     Status: Abnormal   Collection Time: 06/07/16 12:25 PM  Result Value Ref Range   Glucose-Capillary 267 (H) 65 - 99 mg/dL  Magnesium     Status: Abnormal   Collection Time: 06/07/16  1:52 PM  Result Value Ref Range    Magnesium 1.5 (L) 1.7 - 2.4 mg/dL  Glucose, capillary     Status: Abnormal   Collection Time: 06/07/16  3:43 PM  Result Value Ref Range   Glucose-Capillary 241 (H) 65 - 99 mg/dL  Glucose, capillary     Status: Abnormal   Collection Time: 06/07/16  7:59 PM  Result Value Ref Range   Glucose-Capillary 401 (H) 65 - 99 mg/dL  Glucose, capillary     Status: None   Collection Time: 06/07/16 11:47 PM  Result Value Ref Range   Glucose-Capillary 70 65 - 99 mg/dL  Basic metabolic panel     Status: Abnormal   Collection Time: 06/08/16  2:41 AM  Result Value Ref Range   Sodium 143 135 - 145 mmol/L   Potassium 2.9 (L) 3.5 - 5.1 mmol/L   Chloride 113 (H) 101 - 111 mmol/L   CO2 19 (L) 22 - 32 mmol/L   Glucose, Bld 277 (H) 65 - 99 mg/dL  BUN 10 6 - 20 mg/dL   Creatinine, Ser 1.37 (H) 0.44 - 1.00 mg/dL   Calcium 8.2 (L) 8.9 - 10.3 mg/dL   GFR calc non Af Amer 41 (L) >60 mL/min   GFR calc Af Amer 48 (L) >60 mL/min   Anion gap 11 5 - 15  Glucose, capillary     Status: Abnormal   Collection Time: 06/08/16  3:42 AM  Result Value Ref Range   Glucose-Capillary 313 (H) 65 - 99 mg/dL      Imaging Results (Last 48 hours)  Ct Head Wo Contrast  Result Date: 06/06/2016 CLINICAL DATA:  Confusion with left arm weakness and aphasia EXAM: CT HEAD WITHOUT CONTRAST TECHNIQUE: Contiguous axial images were obtained from the base of the skull through the vertex without intravenous contrast. COMPARISON:  None. FINDINGS: Brain: There is mild diffuse atrophy. There is focal hemorrhage arising in the left pons which extends slightly to the right of midline in the lower pons region. There is mild surrounding edema in this area. This hemorrhage at its maximum measures 1.3 x 1.1 cm. No other hemorrhage is seen. There is no well-defined mass. There is no extra-axial fluid collection or midline shift. There is mild small vessel disease in the centra semiovale bilaterally. Vascular: There is no  hyperdense vessel. There is calcification in each carotid siphon region. Skull: The bony calvarium appears intact. Sinuses/Orbits: There is opacification of a right ethmoid air cell. Visualized paranasal sinuses elsewhere clear. There is rightward deviation of the nasal septum. Orbits appear symmetric bilaterally. Other: Visualized mastoid air cells are clear. IMPRESSION: Acute hemorrhage arising from the left pons and extending across the midline to the medial right lower pons near in the inferior pontine region. Suspect hemorrhagic infarct, most likely secondary to hypertension. There is mild atrophy with mild periventricular small vessel disease. No other hemorrhage evident. There are foci of arterial vascular calcification in the carotid siphon regions. There is right ethmoid sinus disease. There is a deviated nasal septum. Critical Value/emergent results were called by telephone at the time of interpretation on 06/06/2016 at 3:58 pm to [facility], who verbally acknowledged these results. Electronically Signed   By: Lowella Grip III M.D.   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 Initial encounter. EXAM: MRI HEAD WITHOUT CONTRAST MRA HEAD WITHOUT CONTRAST TECHNIQUE: Multiplanar, multiecho pulse sequences of the brain and surrounding structures were obtained without intravenous contrast. Angiographic images of the head were obtained using MRA technique without contrast. COMPARISON:  Head CT without contrast 06/06/2016. FINDINGS: MRI HEAD FINDINGS Brain: Intra-axial hemorrhage centered in the left pons with intrinsic T1 hyperintensity and mixed T2 signal (subacute, methemoglobin) encompassing 9 x 14 x 19 mm (estimated blood volume 1 mL). Mild surrounding edema. Minimal mass effect. No extension to the adjacent brainstem or cerebellum.  Associated susceptibility artifact on diffusion weighted imaging, with no larger area of brainstem restricted diffusion. There is a small nearby chronic lacunar infarct in the right pons (series 10, image 10). There is a small chronic lacunar infarct in the left thalamus (series 10, image 14). No other parenchymal blood products are identified. No cortical encephalomalacia, and otherwise gray and white matter signal is normal. No restricted diffusion elsewhere. No midline shift, mass effect, evidence of mass lesion, ventriculomegaly, or extra-axial collection. Cervicomedullary junction and pituitary are within normal limits. Vascular: Major  intracranial vascular flow voids are preserved, with mild intracranial artery tortuosity. Skull and upper cervical spine: Disc bulging and endplate degeneration including bulky anterior endplate osteophytes at C3-C4. Visualized bone marrow signal is within normal limits. Sinuses/Orbits: Negative orbits soft tissues. Mild right ethmoid and left sphenoid sinus mucosal thickening. Other: Visible internal auditory structures appear normal. Mastoids are clear. Negative scalp soft tissues. 2 cm soft tissue nodule in the inferior left parotid space (series 11, image 22). This appears fairly isointense to the gland on coronal T2 imaging (series 13, image 13). The lesion is conspicuous on diffusion. Normal left stylomastoid foramen. MRA HEAD FINDINGS Antegrade flow in the posterior circulation with dominant distal right vertebral artery. Distal vertebral tortuosity without stenosis. Normal left PICA origin. Mildly tortuous but otherwise normal basilar artery without irregularity or stenosis. Normal SCA and PCA origins. The left posterior communicating artery is present, the right is diminutive or absent. Normal bilateral PCA branches. No abnormal vessels demonstrated about the brainstem hemorrhage. Antegrade flow in both ICA siphons. Bilateral cavernous segment tortuosity and  irregularity, greater on the left. No hemodynamically significant siphon stenosis. 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 The left ophthalmic artery origin is normal. The left posterior communicating artery origin is normal. Normal carotid termini. Mild irregularity at the left MCA origin without significant stenosis. Normal ACA and right MCA origins. Tortuous A1 segments. Anterior communicating artery and visualized ACA branches are within normal limits MCA M1 segments are moderately tortuous. MCA bifurcations and visualized MCA branches are within normal limits. IMPRESSION: 1. 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 Blood volume estimated at 1 mL. Mild surrounding edema without significant mass effect. Burtis Junes this is small vessel disease related with a nearby chronic right pontine lacunar infarct, and a chronic left thalamic lacunar infarct. 2. 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 MRA also reveals mild generalized intracranial artery tortuosity, and left greater than right ICA siphon atherosclerosis. 3. Two cm round soft tissue mass in the inferior left parotid space, with T2 signal nearly isointense to the gland suggesting a well differentiated Parotid Neoplasm. Recommend ENT follow-up. Electronically Signed   By: Genevie Ann M.D.   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 Initial encounter. EXAM: MRI HEAD WITHOUT CONTRAST MRA HEAD WITHOUT CONTRAST TECHNIQUE: Multiplanar, multiecho pulse sequences of the brain and surrounding structures were obtained without intravenous contrast. Angiographic images of the head were obtained using  MRA technique without contrast. COMPARISON:  Head CT without contrast 06/06/2016. FINDINGS: MRI HEAD FINDINGS Brain: Intra-axial hemorrhage centered in the left pons with intrinsic T1 hyperintensity and mixed T2 signal (subacute, methemoglobin) encompassing 9 x 14 x 19 mm (estimated blood volume 1 mL). Mild surrounding edema. Minimal mass effect. No extension to the adjacent brainstem or cerebellum. Associated susceptibility artifact on diffusion weighted imaging, with no larger area of brainstem restricted diffusion. There is a small nearby chronic lacunar infarct in the right pons (series 10, image 10). There is a small chronic lacunar infarct in the left thalamus (series 10, image 14). No other parenchymal blood products are identified. No cortical encephalomalacia, and otherwise gray and white matter signal is normal. No restricted diffusion elsewhere. No midline  shift, mass effect, evidence of mass lesion, ventriculomegaly, or extra-axial collection. Cervicomedullary junction and pituitary are within normal limits. Vascular: Major intracranial vascular flow voids are preserved, with mild intracranial artery tortuosity. Skull and upper cervical spine: Disc bulging and endplate degeneration including bulky anterior endplate osteophytes at C3-C4. Visualized bone marrow signal is within normal limits. Sinuses/Orbits: Negative orbits soft tissues. Mild right ethmoid and left sphenoid sinus mucosal thickening. Other: Visible internal auditory structures appear normal. Mastoids are clear. Negative scalp soft tissues. 2 cm soft tissue nodule in the inferior left parotid space (series 11, image 22). This appears fairly isointense to the gland on coronal T2 imaging (series 13, image 13). The lesion is conspicuous on diffusion. Normal left stylomastoid foramen. MRA HEAD FINDINGS Antegrade flow in the posterior circulation with dominant distal right vertebral artery. Distal vertebral tortuosity without stenosis. Normal  left PICA origin. Mildly tortuous but otherwise normal basilar artery without irregularity or stenosis. Normal SCA and PCA origins. The left posterior communicating artery is present, the right is diminutive or absent. Normal bilateral PCA branches. No abnormal vessels demonstrated about the brainstem hemorrhage. Antegrade flow in both ICA siphons. Bilateral cavernous segment tortuosity and irregularity, greater on the left. No hemodynamically significant siphon stenosis. 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 The left ophthalmic artery origin is normal. The left posterior communicating artery origin is normal. Normal carotid termini. Mild irregularity at the left MCA origin without significant stenosis. Normal ACA and right MCA origins. Tortuous A1 segments. Anterior communicating artery and visualized ACA branches are within normal limits MCA M1 segments are moderately tortuous. MCA bifurcations and visualized MCA branches are within normal limits. IMPRESSION: 1. 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 Blood volume estimated at 1 mL. Mild surrounding edema without significant mass effect. Burtis Junes this is small vessel disease related with a nearby chronic right pontine lacunar infarct, and a chronic left thalamic lacunar infarct. 2. 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 MRA also reveals mild generalized intracranial artery tortuosity, and left greater than right ICA siphon atherosclerosis. 3. Two cm round soft tissue mass in the inferior left parotid space, with T2 signal nearly isointense to the gland suggesting a well differentiated Parotid Neoplasm. Recommend ENT follow-up. Electronically Signed   By: Genevie Ann M.D.   On: 06/07/2016 17:25   Dg Chest Portable 1 View  Result Date:  06/06/2016 CLINICAL DATA:  Left hand tremors, left-sided facial tremor, difficulty ambulating EXAM: PORTABLE CHEST 1 VIEW COMPARISON:  Chest x-ray of 05/16/2016 FINDINGS: No pneumonia or effusion is seen. Mediastinal and hilar contours are unremarkable. The heart is within upper limits of normal. No acute bony abnormality is seen. There are degenerative changes in the shoulders present. IMPRESSION: No active lung disease.  Degenerative change in both shoulders. Electronically Signed   By: Ivar Drape M.D.   On: 06/06/2016 17:15   Dg Swallowing Func-speech Pathology  Result Date: 06/07/2016 Objective Swallowing Evaluation: Type of Study: MBS-Modified Barium Swallow Study Patient Details Name: [name] MRN: [id number] Date of Birth: 1956/03/16 Today's Date: 06/07/2016 Time: SLP Start Time (ACUTE ONLY): 1143-SLP Stop Time (ACUTE ONLY): 1203 SLP Time Calculation (min) (ACUTE ONLY): 20 min Past Medical History: Past Medical History: Diagnosis Date . Diabetes mellitus   Hb A1C = 12.6 on 05/15/11, managed on Novolog 70/30, 35 U  qam, 25 U qpm . Hypertension   poorly controlled . Shortness of breath  Past Surgical History: Past Surgical History: Procedure Laterality Date . ANKLE FRACTURE SURGERY  2007 HPI: 65-yo woman with history of DM, poorly controlled,HTN, SOB, currently uses crack cocaine admitted with tremors/contractions of the L hand and face, trouble walking with weakness of the L hand. Pt had seizure while in CT scanner. CT showed a pontine hemorrhage. Per MD notes sh has been having trouble swallowing and gets choked by both solids and liquids. CXR No active lung disease. Degenerative change in both shoulders. No Data Recorded Assessment / Plan / Recommendation CHL IP CLINICAL IMPRESSIONS 06/07/2016 Therapy Diagnosis Mild oral phase dysphagia;Moderate oral phase dysphagia;Mild pharyngeal phase dysphagia;Moderate pharyngeal phase dysphagia Clinical Impression Decreased lingual manipulation with  prolonged mastication and transit with solid texture. Mild-mod sensory based pharyngeal dysphagia exacerbated by what appears to be large bony spurs on C4-5. Laryngeal penetration before and during swallow with thin and nectar not improved with chin tuck posture. Penetrates were transient and remained in vestibule (minimal) without awareness. Honey thick did not yield significant residue during study but may increase if pt lethargic. Recommending nectar thick followed by cough/hard throat clear, Dys 2 texture, pills whole in applesauce, no straws and full supervision initially. ST will continue to follow.   Impact on safety and function Moderate aspiration risk   CHL IP TREATMENT RECOMMENDATION 06/07/2016 Treatment Recommendations Therapy as outlined in treatment plan below   Prognosis 06/07/2016 Prognosis for Safe Diet Advancement Good Barriers to Reach Goals -- Barriers/Prognosis Comment -- CHL IP DIET RECOMMENDATION 06/07/2016 SLP Diet Recommendations Dysphagia 2 (Fine chop) solids;Nectar thick liquid Liquid Administration via Cup;No straw Medication Administration Whole meds with puree Compensations Slow rate;Small sips/bites Postural Changes Remain semi-upright after after feeds/meals (Comment)   CHL IP OTHER RECOMMENDATIONS 06/07/2016 Recommended Consults -- Oral Care Recommendations Oral care BID Other Recommendations --   CHL IP FOLLOW UP RECOMMENDATIONS 06/07/2016 Follow up Recommendations Inpatient Rehab   CHL IP FREQUENCY AND DURATION 06/07/2016 Speech Therapy Frequency (ACUTE ONLY) min 2x/week Treatment Duration 2 weeks      CHL IP ORAL PHASE 06/07/2016 Oral Phase Impaired Oral - Pudding Teaspoon -- Oral - Pudding Cup -- Oral - Honey Teaspoon -- Oral - Honey Cup WFL Oral - Nectar Teaspoon -- Oral - Nectar Cup WFL Oral - Nectar Straw -- Oral - Thin Teaspoon -- Oral - Thin Cup WFL Oral - Thin Straw -- Oral - Puree -- Oral - Mech Soft -- Oral - Regular Delayed oral transit Oral - Multi-Consistency -- Oral -  Pill -- Oral Phase - Comment --  CHL IP PHARYNGEAL PHASE 06/07/2016 Pharyngeal Phase Impaired Pharyngeal- Pudding Teaspoon -- Pharyngeal -- Pharyngeal- Pudding Cup -- Pharyngeal -- Pharyngeal- Honey Teaspoon -- Pharyngeal -- Pharyngeal- Honey Cup -- Pharyngeal -- Pharyngeal- Nectar Teaspoon -- Pharyngeal -- Pharyngeal- Nectar Cup Delayed swallow initiation-pyriform sinuses;Penetration/Aspiration during swallow Pharyngeal Material enters airway, remains ABOVE vocal cords then ejected out;Material enters airway, remains ABOVE vocal cords and not ejected out Pharyngeal- Nectar Straw -- Pharyngeal -- Pharyngeal- Thin Teaspoon -- Pharyngeal -- Pharyngeal- Thin Cup Delayed swallow initiation-pyriform sinuses;Penetration/Aspiration before swallow;Penetration/Aspiration during swallow Pharyngeal Material enters airway, remains ABOVE vocal cords then ejected out;Material enters airway, remains ABOVE vocal cords and not ejected out Pharyngeal- Thin Straw -- Pharyngeal -- Pharyngeal- Puree -- Pharyngeal -- Pharyngeal- Mechanical Soft -- Pharyngeal -- Pharyngeal- Regular WFL Pharyngeal -- Pharyngeal- Multi-consistency -- Pharyngeal -- Pharyngeal- Pill -- Pharyngeal -- Pharyngeal Comment --  CHL  IP CERVICAL ESOPHAGEAL PHASE 06/07/2016 Cervical Esophageal Phase WFL Pudding Teaspoon -- Pudding Cup -- Honey Teaspoon -- Honey Cup -- Nectar Teaspoon -- Nectar Cup -- Nectar Straw -- Thin Teaspoon -- Thin Cup -- Thin Straw -- Puree -- Mechanical Soft -- Regular -- Multi-consistency -- Pill -- Cervical Esophageal Comment -- No flowsheet data found. [name] 06/07/2016, 1:53 PM  [name].Ed CCC-SLP Pager [phone]               Assessment/Plan: Diagnosis: left pontine hemorrhage with right hemiparesis 1. Does the need for close, 24 hr/day medical supervision in concert with the patient's rehab needs make it unreasonable for this patient to be served in a less intensive setting? Yes 2. Co-Morbidities  requiring supervision/potential complications: malignant hypertension 3. Due to bladder management, bowel management, safety, skin/wound care, disease management, medication administration, pain management and patient education, does the patient require 24 hr/day rehab nursing? Yes 4. Does the patient require coordinated care of a physician, rehab nurse, PT (1-2 hrs/day, 5 days/week), OT (1-2 hrs/day, 5 days/week) and SLP (1-2 hrs/day, 5 days/week) to address physical and functional deficits in the context of the above medical diagnosis(es)? Yes Addressing deficits in the following areas: balance, endurance, locomotion, strength, transferring, bowel/bladder control, bathing, dressing, feeding, grooming, toileting, speech, swallowing and psychosocial support 5. Can the patient actively participate in an intensive therapy program of at least 3 hrs of therapy per day at least 5 days per week? Yes 6. The potential for patient to make measurable gains while on inpatient rehab is excellent 7. Anticipated functional outcomes upon discharge from inpatient rehab are modified independent  with PT, modified independent with OT, modified independent with SLP. 8. Estimated rehab length of stay to reach the above functional goals is: 8-12 days 9. Does the patient have adequate social supports and living environment to accommodate these discharge functional goals? Yes 10. Anticipated D/C setting: Home 11. Anticipated post D/C treatments: HH therapy and Outpatient therapy 12. Overall Rehab/Functional Prognosis: excellent  RECOMMENDATIONS: This patient's condition is appropriate for continued rehabilitative care in the following setting: CIR Patient has agreed to participate in recommended program. Yes Note that insurance prior authorization may be required for reimbursement for recommended care.  Comment: Rehab Admissions Coordinator to follow up.  Thanks,  [name] Staggers, MD,  [name]     06/08/2016

## 2016-06-13 NOTE — Progress Notes (Signed)
Retta Diones, RN Rehab Admission Coordinator Signed Physical Medicine and Rehabilitation  PMR Pre-admission Date of Service: 06/09/2016 11:31 AM  Related encounter: ED to Hosp-Admission (Discharged) from 06/06/2016 in [facility]       [] Hide copied text PMR Admission Coordinator Pre-Admission Assessment  Patient: Nykisha Jay is an 60 y.o., female MRN: [id number] DOB: 09-14-1955 Height: 5\' 3"  (160 cm) Weight: 93.4 kg (205 lb 14.6 oz)                                                                                                                                                  Insurance Information HMO: No   PPO:       PCP:       IPA:       80/20:       OTHER:   PRIMARY:  Medicaid [place] Access      Policy#: [id number]      Subscriber: Wynn Banker CM Name:        Phone#:       Fax#:   Pre-Cert#:        Employer:  Disabled/Not employed Benefits:  Phone #: [phone]     Name:  Automated Eff. Date: Eligible 06/08/16 with coverage code [id number]     Deduct:        Out of Pocket Max:        Life Max:   CIR:        SNF:   Outpatient:       Co-Pay:   Home Health:        Co-Pay:   DME:       Co-Pay:   Providers:    Emergency Contact Information        Contact Information    Name Relation Home Work Mobile   [place] Friend [phone]     [place] Significant other   [phone]     Current Medical History  Patient Admitting Diagnosis: Left pontine hemorrhage with right hemiparesis    History of Present Illness:A 60 y.o.right handed femalewith history of poorly controlled diabetes mellitus, hypertension, tobacco and cocaine abuse. 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 SBP 151-202. Urine drug screen positive for cocaine. Troponin negative. 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 While in the CT scan reported seizure and was given Ativan. EEG showed no seizure activity. Placed on nicardipinedrip for  blood pressure control. Loaded with Keppra for seizure prophylaxis. Follow-up MRI/MRA showed stable hemorrhage, no related findings on intracranial MRA. Hemoglobin A1c 11.4 with insulin therapy as directed. Currently on a dysphagia #2 nectar thick liquid diet. Physical and occupational therapy evaluation completed 06/07/2016 with recommendations of physical medicine rehabilitation consult.   Total: 4=NIH  Past Medical History      Past Medical History:  Diagnosis Date  . Diabetes mellitus    Hb A1C = 12.6 on 05/15/11, managed on Novolog 70/30, 35 U qam, 25 U qpm  . Hypertension    poorly controlled  . Shortness of breath     Family History  family history is not on file.  Prior Rehab/Hospitalizations: No previous rehab.  Has the patient had major surgery during 100 days prior to admission? No  Current Medications   Current Facility-Administered Medications:  .  0.9 %  sodium chloride infusion, , Intravenous, Continuous, Darrel Reach, MD, Last Rate: 100 mL/hr at 06/08/16 1531 .  acetaminophen (TYLENOL) tablet 650 mg, 650 mg, Oral, Q4H PRN **OR** acetaminophen (TYLENOL) suppository 650 mg, 650 mg, Rectal, Q4H PRN, Darrel Reach, MD .  acyclovir (ZOVIRAX) tablet 400 mg, 400 mg, Oral, QID, David L Rinehuls, PA-C, 400 mg at 06/09/16 1240 .  amLODipine (NORVASC) tablet 10 mg, 10 mg, Oral, Daily, David L Rinehuls, PA-C, 10 mg at 06/09/16 1100 .  chlorhexidine (PERIDEX) 0.12 % solution 15 mL, 15 mL, Mouth Rinse, BID, Garvin Fila, MD, 15 mL at 06/09/16 1102 .  cyclobenzaprine (FLEXERIL) tablet 10 mg, 10 mg, Oral, BID PRN, Garvin Fila, MD .  folic acid (FOLVITE) tablet 1 mg, 1 mg, Oral, Daily, Darrel Reach, MD, 1 mg at 06/09/16 1100 .  food thickener (THICK IT) powder, , Oral, PRN, Garvin Fila, MD .  furosemide (LASIX) tablet 20 mg, 20 mg, Oral, Daily PRN, Garvin Fila, MD .  gabapentin (NEURONTIN) capsule 600 mg, 600 mg, Oral, QID, David L Rinehuls, PA-C, 600 mg at 06/09/16 1101 .  lisinopril (PRINIVIL,ZESTRIL) tablet 20 mg, 20 mg, Oral, Daily, 20 mg at 06/09/16 1101 **AND** hydrochlorothiazide (MICROZIDE) capsule 12.5 mg, 12.5 mg, Oral, Daily, Kimberly B Hammons, RPH, 12.5 mg at 06/09/16 1100 .  insulin aspart (novoLOG) injection 0-20 Units, 0-20 Units, Subcutaneous, TID WC, Garvin Fila, MD, 11 Units at 06/09/16 1241 .  insulin aspart protamine- aspart (NOVOLOG MIX 70/30) injection 40 Units, 40 Units, Subcutaneous, BID, Garvin Fila, MD, 40 Units at 06/09/16 1102 .  labetalol (NORMODYNE,TRANDATE) injection 10 mg, 10 mg, Intravenous, Q2H PRN, Wallie Char, 10 mg at 06/09/16 0746 .  levETIRAcetam (KEPPRA) tablet 1,000 mg, 1,000 mg, Oral, Q12H, Garvin Fila, MD, 1,000 mg at 06/09/16 0658 .  [EXPIRED] LORazepam (ATIVAN) injection 0-4 mg, 0-4 mg, Intravenous, Q6H, 2 mg at 06/07/16 1637 **FOLLOWED BY** LORazepam (ATIVAN) injection 0-4 mg, 0-4 mg, Intravenous, Q12H, Darrel Reach, MD .  LORazepam (ATIVAN) injection 1 mg, 1 mg, Intravenous, Once, Benavides, PA-C .  LORazepam (ATIVAN) tablet 1 mg, 1 mg, Oral, Q6H PRN **OR** LORazepam (ATIVAN) injection 1 mg, 1 mg, Intravenous, Q6H PRN, Darrel Reach, MD .  MEDLINE mouth rinse, 15 mL, Mouth Rinse, q12n4p, Garvin Fila, MD, 15 mL at 06/08/16 1224 .  morphine 4 MG/ML injection 1-4 mg, 1-4 mg, Intravenous, Q1H PRN, Darrel Reach, MD .  multivitamin with minerals tablet 1 tablet, 1 tablet, Oral, Daily, Darrel Reach, MD, 1 tablet  at 06/09/16 1100 .  nicotine (NICODERM CQ - dosed in mg/24 hours) patch 14 mg, 14 mg, Transdermal, Daily, Darrel Reach, MD, 14 mg at 06/09/16 1101 .  pantoprazole (PROTONIX) EC tablet 40 mg, 40 mg, Oral, QHS, Garvin Fila, MD, 40 mg at 06/08/16 2204 .  potassium chloride (KLOR-CON) packet 20 mEq, 20 mEq, Oral, TID, David L Rinehuls, PA-C .  RESOURCE THICKENUP CLEAR, , Oral, PRN, Garvin Fila, MD .  simvastatin (ZOCOR) tablet 10 mg, 10 mg, Oral, QHS, David L Rinehuls, PA-C, 10 mg at 06/08/16 2201 .  thiamine (VITAMIN B-1) tablet 100 mg, 100 mg, Oral, Daily, 100 mg at 06/09/16 1100 **OR** [DISCONTINUED] thiamine (B-1) injection 100 mg, 100 mg, Intravenous, Daily, Darrel Reach, MD, 100 mg at 06/07/16 1303 .  traMADol (ULTRAM) tablet 50 mg, 50 mg, Oral, Daily PRN, Garvin Fila, MD .  traZODone (DESYREL) tablet 100 mg, 100 mg, Oral, QHS, David L Rinehuls, PA-C, 100 mg at 06/08/16 2205  Patients Current Diet: DIET DYS 2 Room service appropriate? Yes; Fluid consistency: Nectar Thick  Precautions / Restrictions Precautions Precautions: Fall Precaution Comments: watch BP  Restrictions Weight Bearing Restrictions: No   Has the patient had 2 or more falls or a fall with injury in the past year?Yes.  Reports 2 falls with no injury.  Prior Activity Level Household: Went out 1 X a month for groceries and 1-2 X a month to the pharmacy, otherwise homebound.  Home Assistive Devices / Equipment Home Assistive Devices/Equipment: Environmental consultant (specify type), CBG Meter Home Equipment: None (all DME is for boyfriend )  Prior Device Use: Indicate devices/aids used by the patient prior to current illness, exacerbation or injury? None  Prior Functional Level Prior Function Level of Independence: Independent Comments: helping take care of boyfriend that she has to dress and bath. [name] ( boyfriend) RN comes Monday - Friday to help his caregiver  Self Care: Did the patient need help  bathing, dressing, using the toilet or eating?  Independent  Indoor Mobility: Did the patient need assistance with walking from room to room (with or without device)? Independent  Stairs: Did the patient need assistance with internal or external stairs (with or without device)? Independent  Functional Cognition: Did the patient need help planning regular tasks such as shopping or remembering to take medications? Independent  Current Functional Level Cognition Arousal/Alertness: Awake/alert Overall Cognitive Status: Impaired/Different from baseline Orientation Level: Oriented X4 Safety/Judgement: Decreased awareness of safety, Decreased awareness of deficits General Comments: Requires cues for safety.  Attention: Sustained Sustained Attention: Appears intact Memory: Appears intact (for 4 words) Awareness: Impaired Awareness Impairment: Anticipatory impairment, Emergent impairment Problem Solving: Impaired Problem Solving Impairment: Verbal basic Safety/Judgment:  (questionable)    Extremity Assessment (includes Sensation/Coordination) Upper Extremity Assessment: RUE deficits/detail, LUE deficits/detail RUE Deficits / Details: numbness compared to R. 4 out 5 MMT. able to hold [place] and place in mouth  Lower Extremity Assessment: Defer to PT evaluation RLE Deficits / Details: Grossly ~3/5 throughout except 2/5 hip flexion. LLE Sensation: decreased light touch   ADLs Overall ADL's : Needs assistance/impaired Eating/Feeding: Supervision/ safety Eating/Feeding Details (indicate cue type and reason): repeatly asking for juice or ice Grooming: Wash/dry face, Wash/dry hands, Set up Toilet Transfer: Moderate assistance Toileting- Clothing Manipulation and Hygiene: Minimal assistance Toileting - Clothing Manipulation Details (indicate cue type and reason): (A) for steady. standing without (A) requires bil Ue to power off 3n1 General ADL Comments: pt declined 3n1 with therapy but  requesting it with RN arrival at  end of session. Ot (A)ing with transfer   Mobility Overal bed mobility: Needs Assistance Bed Mobility: Supine to Sit Supine to sit: Mod assist, HOB elevated General bed mobility comments: (A) to elevate trunk from bed surface.    Transfers Overall transfer level: Needs assistance Equipment used: Rolling walker (2 wheeled) Transfers: Sit to/from Stand Sit to Stand: Min assist Stand pivot transfers: Min assist General transfer comment: Assist to power to standing; cues for hand placement/technique. Stood from [employer], from chair x1. Transferred to chair post ambulation bout.   Ambulation / Gait / Stairs / Wheelchair Mobility Ambulation/Gait Ambulation/Gait assistance: Museum/gallery curator (Feet): 40 Feet (+50') Assistive device: Rolling walker (2 wheeled) Gait Pattern/deviations: Step-through pattern, Step-to pattern, Decreased stride length, Trunk flexed, Shuffle General Gait Details: Slow, unsteady gait with varying speeds. Pt with blurry vision. Fatigues quickly. 1 seated rest break. Gait velocity: decreased Gait velocity interpretation: Below normal speed for age/gender   Posture / Balance Balance Overall balance assessment: Needs assistance Sitting-balance support: Feet supported, No upper extremity supported Sitting balance-Leahy Scale: Good Standing balance support: During functional activity, Bilateral upper extremity supported Standing balance-Leahy Scale: Poor Standing balance comment: Reliant on BUEs for support in standing.    Special needs/care consideration BiPAP/CPAP No CPM No Continuous Drip IV 0.9% NS 100 mL/hr Dialysis No        Life Vest  No Oxygen No Special Bed No Trach Size No Wound Vac (area) No       Skin No                            Bowel mgmt: Last documented BM 06/07/16 Bladder mgmt: Voiding on bedpan with episodes of incontinence Diabetic mgmt Yes.  In insulin at home.  Hgb A1C 12.6   Previous Home  Environment Living Arrangements: Spouse/significant other, Other relatives  Lives With: Significant other Available Help at Discharge: Family Type of Home: House Home Layout: One level Home Access: Building control surveyor Shower/Tub: Multimedia programmer: [place]: No  Discharge Living Setting Plans for Discharge Living Setting: House, Lives with (comment) (Lives with common law husband.) Type of Home at Discharge: House Discharge Home Layout: One level Discharge Home Access: Stairs to enter, Level entry Entrance Stairs-Number of Steps: Has 3-4 steps at front and level entry at back Does the patient have any problems obtaining your medications?: No  Social/Family/Support Systems Patient Roles: Other (Comment) (Has a boyfriend who she helps care for.) Contact Information: Ishmael Holter - friend - [phone] Anticipated Caregiver: self Ability/Limitations of Caregiver: Common law husband has caregivers M-F 11 am to 1 pm. Caregiver Availability: Intermittent Discharge Plan Discussed with Primary Caregiver: Yes Is Caregiver In Agreement with Plan?: Yes Does Caregiver/Family have Issues with Lodging/Transportation while Pt is in Rehab?: No  Goals/Additional Needs Patient/Family Goal for Rehab: PT/OT/SLP mod I goals Expected length of stay: 8-12 days Cultural Considerations: None Dietary Needs: Dys 2, nectar thick liquids Equipment Needs: TBD Pt/Family Agrees to Admission and willing to participate: Yes Program Orientation Provided & Reviewed with Pt/Caregiver Including Roles  & Responsibilities: Yes  Decrease burden of Care through IP rehab admission: N/A  Possible need for SNF placement upon discharge: Not planned  Patient Condition: This patient's condition remains as documented in the consult dated 06/09/16, in which the Rehabilitation Physician determined and documented that the patient's condition is appropriate for intensive rehabilitative  care in an inpatient rehabilitation facility. Will admit to inpatient  rehab today.  Preadmission Screen Completed By:  Retta Diones, 06/09/2016 2:11 PM ______________________________________________________________________   Discussed status with Dr. Naaman Plummer on 06/09/16 at 1410 and received telephone approval for admission today.  Admission Coordinator:  Retta Diones, time1410/Date12/01/17       Cosigned by

## 2016-06-14 ENCOUNTER — Inpatient Hospital Stay (HOSPITAL_COMMUNITY): Payer: Medicaid Other

## 2016-06-14 ENCOUNTER — Inpatient Hospital Stay (HOSPITAL_COMMUNITY): Payer: Medicaid Other | Admitting: Physical Therapy

## 2016-06-14 ENCOUNTER — Inpatient Hospital Stay (HOSPITAL_COMMUNITY): Payer: Medicaid Other | Admitting: Speech Pathology

## 2016-06-14 ENCOUNTER — Inpatient Hospital Stay (HOSPITAL_COMMUNITY): Payer: Medicaid Other | Admitting: Occupational Therapy

## 2016-06-14 DIAGNOSIS — E1165 Type 2 diabetes mellitus with hyperglycemia: Secondary | ICD-10-CM

## 2016-06-14 LAB — GLUCOSE, CAPILLARY
Glucose-Capillary: 329 mg/dL — ABNORMAL HIGH (ref 65–99)
Glucose-Capillary: 362 mg/dL — ABNORMAL HIGH (ref 65–99)
Glucose-Capillary: 77 mg/dL (ref 65–99)
Glucose-Capillary: 96 mg/dL (ref 65–99)

## 2016-06-14 MED ORDER — POTASSIUM CHLORIDE CRYS ER 20 MEQ PO TBCR
20.0000 meq | EXTENDED_RELEASE_TABLET | Freq: Two times a day (BID) | ORAL | Status: DC
  Administered 2016-06-14 – 2016-06-19 (×10): 20 meq via ORAL
  Filled 2016-06-14 (×10): qty 1

## 2016-06-14 MED ORDER — PANTOPRAZOLE SODIUM 40 MG PO TBEC
40.0000 mg | DELAYED_RELEASE_TABLET | Freq: Two times a day (BID) | ORAL | Status: DC
  Administered 2016-06-14 – 2016-06-19 (×10): 40 mg via ORAL
  Filled 2016-06-14 (×10): qty 1

## 2016-06-14 MED ORDER — METOPROLOL TARTRATE 25 MG PO TABS
25.0000 mg | ORAL_TABLET | Freq: Two times a day (BID) | ORAL | Status: DC
  Administered 2016-06-14 – 2016-06-20 (×12): 25 mg via ORAL
  Filled 2016-06-14 (×12): qty 1

## 2016-06-14 NOTE — Progress Notes (Signed)
Occupational Therapy Session Note  Patient Details  Name: [name] MRN: [id number] Date of Birth: 1955/08/20  Today's Date: 06/14/2016 OT Individual Time: 1000-1100 OT Individual Time Calculation (min): 60 min     Short Term Goals: Week 1:  OT Short Term Goal 1 (Week 1): Pt will complete bathing with supervision  OT Short Term Goal 2 (Week 1): Pt will complete toilet transfer with LRAD and consistent Min A OT Short Term Goal 3 (Week 1): Pt will complete LB dressing with Min A sit<stand OT Short Term Goal 4 (Week 1): Pt will complete dynamic standing task for 4 minutes without rest breaks for improving balance during ADLs  Skilled Therapeutic Interventions/Progress Updates:    Pt resting in w/c upon arrival.  Pt had already bathed and changed clothing in earlier therapy session.  Pt initially engaged in simple home mgmt tasks at [facility] level with emphasis on RW safety.  Pt issued RW bag and practiced transporting items with RW and using bag appropriately.  Pt practiced tub bench transfers.  Pt stated she already owned a tub transfer bench.  Pt transitioned to RUE [facility] and gross motor activities focus on dexterity, controlled movements, and manipulation.  Pt returned to room and remained in w/c with QRB in place and all needs within reach. Focus on activity tolerance, safety awareness, functional amb with RW and functional transfers, standing balance, and continued discharge planning.  Therapy Documentation Precautions:  Precautions Precautions: Fall Precaution Comments:  (Monitor BP) Restrictions Weight Bearing Restrictions: No   Pain: Pain Assessment Pain Assessment: No/denies pain  See Function Navigator for Current Functional Status.   Therapy/Group: Individual Therapy  Leroy Libman 06/14/2016, 11:04 AM

## 2016-06-14 NOTE — Patient Care Conference (Signed)
Inpatient RehabilitationTeam Conference and Plan of Care Update Date: 06/13/2016   Time: 2:30 PM    Patient Name: [name]      Medical Record Number: [id number]  Date of Birth: June 07, 1956 Sex: Female         Room/Bed: 4W11C/4W11C-01 Payor Info: Payor: MEDICAID [place] / Plan: MEDICAID [place] ACCESS / Product Type: *No Product type* /    Admitting Diagnosis: L pontine CVA  Admit Date/Time:  06/09/2016  5:44 PM Admission Comments: No comment available   Primary Diagnosis:  ICH (intracerebral hemorrhage) ([place]) Principal Problem: ICH (intracerebral hemorrhage) ([place])  Patient Active Problem List   Diagnosis Date Noted  . Right hemiparesis ([place]) 06/09/2016  . 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 Cytotoxic brain edema ([employer] [place]) 06/08/2016  . ICH (intracerebral hemorrhage) ([place]) 06/06/2016  . Chest pain, musculoskeletal 05/16/2011  . Vaginal candidiasis 05/16/2011  . Hypertension 05/16/2011  . Diabetes mellitus, type 2 ([place]) 05/16/2011  . CHEST PAIN 10/01/2009  . CELLULITIS AND ABSCESS OF LEG EXCEPT FOOT 08/31/2009  . INSOMNIA 07/13/2008  . MRSA 04/17/2008  . NECK MASS 04/17/2008  . COCAINE ABUSE 11/01/2007  . OBESITY NOS 07/14/2006  . DENTAL CARIES 07/14/2006  . DIABETES MELLITUS, TYPE II 04/26/2006  . ALCOHOL ABUSE 04/26/2006  . TOBACCO ABUSE 04/26/2006  . DEPRESSION 04/26/2006  . Essential hypertension 04/26/2006  . GERD 04/26/2006  . LOW BACK PAIN 04/26/2006    Expected Discharge Date: Expected Discharge Date: 06/20/16  Team Members Present: Physician leading conference: Dr. Alger Simons Social Worker Present: Alfonse Alpers, LCSW Nurse Present: Dorien Chihuahua, RN PT Present: Lavone Nian, PT;Other (comment) Isabelle Course, PT) OT Present: Willeen Cass, OT;Roanna Epley, COTA SLP Present: Weston Anna, SLP PPS Coordinator present : Daiva Nakayama, RN, CRRN     Current Status/Progress Goal Weekly Team Focus  Medical   left pontine hemorrhage. right hemiparesis,  htn, dysphagia  safety awareness. right hemiparesis  swallowing, bp control   Bowel/Bladder   continent of bowel and bladder, LBM 06-12-16  Remain continent of bowel and bladder  Assist patient with tolieting needs prn, monitor regular bowel schedule   Swallow/Nutrition/ Hydration   Dys. 2 textures with nectar-thick liquids, Supervision   Mod I with least restrictive diet  Trials of upgraded textures & liquids    ADL's   functional transfers-steady A; LB bathing/dressing-steady A; UB bathing/dressing-supervison, impulsive, decreased safety awareness.  mod I overall  standing balance, functinoal transfers, safety awareness, activity tolerance   Mobility   min assist ambulation without AD, min assist transfers, poor balance, supervision bed mobility  mod I overall  balance, NMR, gait, transfers, stair negotiation   Communication             Safety/Cognition/ Behavioral Observations  Supervision  Mod I  recall of new information    Pain   n c/o pain  pain = or <3   Monitor pain q shfit and prn   Skin   No skin issues  Remain free from skin issues  Monitor skin q shift and prn    Rehab Goals Patient on target to meet rehab goals: Yes Rehab Goals Revised: none *See Care Plan and progress notes for long and short-term goals.  Barriers to Discharge: right hemiparesis, limited assistance at home    Possible Resolutions to Barriers:  NMR, ongoing strength and balance training    Discharge Planning/Teaching Needs:  Pt to return to her home at mod I level.  She will have her husband (who also needs care) and her  friend with her at home.  Pt is able to direct her care for anything she finds she needs help with, but will be modified independent at d/c.   Team Discussion:  Pt is doing better medically.  Pt is breathing and coughing better and has been afebrile.  Goals are for mod I at home.  Pt is min assist without walker, but does better with walker at supervision.  Pt needed steady assist  for ADLs.  ST is working on swallowing and wants to repeat MBS to try to progress her liquids.  Pt can be impulsive and chair alarm was applied.  Revisions to Treatment Plan:  none   Continued Need for Acute Rehabilitation Level of Care: The patient requires daily medical management by a physician with specialized training in physical medicine and rehabilitation for the following conditions: Daily direction of a multidisciplinary physical rehabilitation program to ensure safe treatment while eliciting the highest outcome that is of practical value to the patient.: Yes Daily medical management of patient stability for increased activity during participation in an intensive rehabilitation regime.: Yes Daily analysis of laboratory values and/or radiology reports with any subsequent need for medication adjustment of medical intervention for : Neurological problems;Blood pressure problems  [name], Silvestre Mesi 06/14/2016, 11:42 AM

## 2016-06-14 NOTE — Progress Notes (Signed)
Speech Language Pathology Daily Session Note  Patient Details  Name: [name] MRN: [id number] Date of Birth: 04-19-1956  Today's Date: 06/14/2016 SLP Individual Time: 1530-1600 SLP Individual Time Calculation (min): 30 min   Short Term Goals:Week 1: SLP Short Term Goal 1 (Week 1): Pt will utilize external memory aids to recall new, daily information with Min A verbal and question cues.  SLP Short Term Goal 2 (Week 1): Pt will consume tirals of thin liquids with minimal overt s/s of aspiration over 2 sessions to asssess readiness for repeat MBS with supervision verbal cues.  SLP Short Term Goal 3 (Week 1): Pt will consume dysphagia 2 diet with no overt s/s of aspiration over 3 sessions to assess readiness for diet upgrade with supervision verbal cues.   Skilled Therapeutic Interventions: Skilled treatment session focused on addressing cognition goals. Patient participated in a new learning task with Mod faded to Supervision level question cues for recall of new learning procedures.  SLP also facilitated session by providing Min question cues to self-monitor and correct errors.  Continue with current plan of care.    Function:  Cognition Comprehension Comprehension assist level: Follows basic conversation/direction with extra time/assistive device  Expression   Expression assist level: Expresses basic 90% of the time/requires cueing < 10% of the time.  Social Interaction Social Interaction assist level: Interacts appropriately with others - No medications needed.  Problem Solving Problem solving assist level: Solves complex 90% of the time/cues < 10% of the time  Memory Memory assist level: More than reasonable amount of time    Pain Pain Assessment Pain Assessment: No/denies pain  Therapy/Group: Individual Therapy  Carmelia Roller., CCC-SLP [id number]  [place] 06/14/2016, 4:52 PM

## 2016-06-14 NOTE — Progress Notes (Signed)
Occupational Therapy Session Note  Patient Details  Name: [name] MRN: [id number] Date of Birth: July 27, 1955  Today's Date: 06/14/2016 OT Individual Time: 0830-0900 OT Individual Time Calculation (min): 30 min     Short Term Goals:Week 1:  OT Short Term Goal 1 (Week 1): Pt will complete bathing with supervision  OT Short Term Goal 2 (Week 1): Pt will complete toilet transfer with LRAD and consistent Min A OT Short Term Goal 3 (Week 1): Pt will complete LB dressing with Min A sit<stand OT Short Term Goal 4 (Week 1): Pt will complete dynamic standing task for 4 minutes without rest breaks for improving balance during ADLs  Skilled Therapeutic Interventions/Progress Updates:    Pt seen for ADL retraining of shower, toileting, dressing with a focus on dynamic balance and R side motor control. Pt received in bed and stated she would like to shower. Ambulated with RW with min A to toilet and then to shower. Stood in shower with S. Needed A with tight socks today. When pt was ambulating out of bathroom with RW, demonstrated decreased LE stability and needed mod A to stabilize balance. Sat in arm chair to complete dressing with S.  PT arrived for pt's next session.   Therapy Documentation Precautions:  Precautions Precautions: Fall Precaution Comments:  (Monitor BP) Restrictions Weight Bearing Restrictions: No      Pain: Pain Assessment Pain Assessment: No/denies pain ADL: ADL ADL Comments: Please see functional navigator for ADL status   See Function Navigator for Current Functional Status.   Therapy/Group: Individual Therapy  [name] 06/14/2016, 9:02 AM

## 2016-06-14 NOTE — Progress Notes (Signed)
[facility] PHYSICAL MEDICINE & REHABILITATION     PROGRESS NOTE    Subjective/Complaints: Slept well. Coughing decreased although she still has her spells. She displays some lapses in safety awareness still.   ROS: pt denies nausea, vomiting, diarrhea, cough, shortness of breath or chest pain   Objective: Vital Signs: Blood pressure (!) 175/89, pulse 87, temperature 98.9 F (37.2 C), temperature source Oral, resp. rate 20, height 5\' 3"  (1.6 m), weight 94.2 kg (207 lb 10.8 oz), SpO2 94 %. No results found.  Recent Labs  06/12/16 0559  WBC 11.4*  HGB 13.5  HCT 40.7  PLT 246    Recent Labs  06/12/16 0559  NA 138  K 3.5  CL 106  GLUCOSE 111*  BUN 8  CREATININE 1.16*  CALCIUM 8.4*   CBG (last 3)   Recent Labs  06/13/16 2131 06/14/16 0635 06/14/16 1145  GLUCAP 265* 329* 362*    Wt Readings from Last 3 Encounters:  06/14/16 94.2 kg (207 lb 10.8 oz)  06/07/16 93.4 kg (205 lb 14.6 oz)  01/14/16 101.2 kg (223 lb)    Physical Exam:  Constitutional: She appears well-developed.  Eyes: PERRL.    Neck: suppple.  Cardiovascular: RRR Respiratory: occ rhonchi, normal effort GI: Soft.   Neurological: She is alert.  Alert. Speech is still dysarthric. Reasonable insight and awareness. Follows all commands. . Continued CN 7. Marland KitchenRUE remains 4-/5 bicep, tricep, wrist, hand. RLE 4 to 4+/5 prox to distal. LUE 5/5. LLE 5/5. No sensory findings  Skin: intact.  Psychiatric: pleasant and cooperative  Assessment/Plan: 1. 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 Physiatrist is providing close team supervision and 24 hour management of active medical problems listed below. Physiatrist and rehab team continue to assess barriers to discharge/monitor patient progress toward functional and medical goals.  Function:  Bathing Bathing position    Position: Shower  Bathing parts Body parts bathed by patient: Right arm, Left arm, Chest, Abdomen, Front perineal area, Buttocks, Right upper leg, Left upper leg, Right lower leg, Left lower leg Body parts bathed by helper: Back  Bathing assist Assist Level: Supervision or verbal cues      Upper Body Dressing/Undressing Upper body dressing   What is the patient wearing?: Pull over shirt/dress     Pull over shirt/dress - Perfomed by patient: Thread/unthread right sleeve, Thread/unthread left sleeve, Put head through opening, Pull shirt over trunk          Upper body assist Assist Level: Supervision or verbal cues      Lower Body Dressing/Undressing Lower body dressing   What is the patient wearing?: Underwear, Pants, Non-skid slipper socks Underwear - Performed by patient: Thread/unthread right underwear leg, Thread/unthread left underwear leg, Pull underwear up/down   Pants- Performed by patient: Thread/unthread right pants leg, Thread/unthread left pants leg, Pull pants up/down Pants- Performed by helper: Pull pants up/down Non-skid slipper socks- Performed by patient: Don/doff right sock, Don/doff left sock Non-skid slipper socks- Performed by helper: Don/doff right sock, Don/doff left sock       Shoes - Performed by helper: Don/doff right shoe, Don/doff left shoe          Lower body assist Assist for lower body dressing: Touching or steadying assistance (Pt > 75%)      Toileting Toileting   Toileting steps completed by patient: Adjust clothing prior to toileting, Performs perineal hygiene, Adjust  clothing after toileting Toileting steps completed by helper: Adjust clothing prior to toileting, Adjust clothing after toileting, Performs perineal hygiene Toileting Assistive Devices: Grab bar or rail  Toileting assist Assist level: Touching or steadying assistance (Pt.75%)   Transfers Chair/bed transfer   Chair/bed transfer method: Ambulatory Chair/bed transfer assist  level: Touching or steadying assistance (Pt > 75%) Chair/bed transfer assistive device: Armrests, Medical sales representative     Max distance: 150 ft Assist level: Touching or steadying assistance (Pt > 75%)   Wheelchair   Type: Manual Max wheelchair distance: 167ft  Assist Level: Supervision or verbal cues  Cognition Comprehension Comprehension assist level: Follows basic conversation/direction with extra time/assistive device  Expression Expression assist level: Expresses basic 90% of the time/requires cueing < 10% of the time.  Social Interaction Social Interaction assist level: Interacts appropriately with others - No medications needed.  Problem Solving Problem solving assist level: Solves complex 90% of the time/cues < 10% of the time  Memory Memory assist level: More than reasonable amount of time   Medical Problem List and Plan:  1. Right hemiparesthesias and dysarthria /dysphagia secondary to left pontine hemorrhage secondary to hypertensive crisis  -continue therapies, pt,ot, slop  2. DVT Prophylaxis/Anticoagulation: SCD's 3. Pain Management/chronic back pain: Ultram 50 mg daily as needed as well as Flexeril. Patient had been on Neurontin 600 mg 4 times a day, hydrocodone as needed prior to admission.  4. Dysphagia. Dysphagia #2 nectar thick liquids.   --encourage PO  -[place] MBS tomorrow 5. Neuropsych: This patient is capable of making decisions on her own behalf.  6. Skin/Wound Care: Routine skin checks  7. Fluids/Electrolytes/Nutrition: Routine I&O with follow-up chemistries  8. Diabetes mellitus with peripheral neuropathy. Hemoglobin A1c 11.4.  -have  70/30 to 50u am and 30u pm to avoid morning lows (although this morning very high!)    9. Seizure prophylaxis. Keppra 1000 mg every 12 hours. EEG negative  10. Hypertension. Lisinopril 20 mg daily, HCTZ 12.5 mg daily.   -fair control-  -stopped hctz due to nectar fluids/CRI  -increase metoprolol to 25mg  bid  (HR in 80's) 11. Hyperlipidemia. Patient on Zocor 10 mg daily at bedtime prior to admission.  12. Tobacco/cocaine abuse. Nicoderm patch. Provide counseling  13. Hypokalemia. Potassium supplement   14. Question acute on chronic renal insufficiency. Baseline creatinine 1.74 on admission his creatinine 1.38. Cr stable to improved 15. Wet/productive cough---improved  -scheduled mucinex helpful  -encouraged coughing, swallowing strategies per SLP  -was smoking up until this admission  -FV/IS  LOS (Days) 5 A FACE TO FACE EVALUATION WAS PERFORMED  SWARTZ,ZACHARY T 06/14/2016 1:06 PM

## 2016-06-14 NOTE — Progress Notes (Signed)
Social Work Patient ID: Wynn Banker, female   DOB: [birth year], 60 y.o.   MRN: [id number]   CSW met with pt to update her on team conference discussion.  Pt was slightly disappointed that she needs to stay until 06-20-16, but understands we want to get her to mod I level to be her best at home and this is what she wants too.  CSW offered to call her family and she wanted CSW to call home phone and speak with Brayton Layman (friend helping husband while she is here) or [name].  CSW called and spoke to [facility] and pt had already called to give her the news.  She will be the one to come get pt at d/c.  CSW will continue to follow and assist as needed.

## 2016-06-14 NOTE — Progress Notes (Signed)
Physical Therapy Session Note  Patient Details  Name: [name] MRN: [id number] Date of Birth: Apr 10, 1956  Today's Date: 06/14/2016 PT Individual Time: [phone] and [id number] PT Individual Time Calculation (min): 27 min and 57 min   Short Term Goals: Week 1:  PT Short Term Goal 1 (Week 1): STG= LTG due to ELOS  Skilled Therapeutic Interventions/Progress Updates:    Treatment 1: Pt requesting to eat breakfast between OT & PT sessions; pt missed 18 minutes of skilled PT treatment 2/2 breakfast.   Pt received in chair & agreeable to tx, noting R knee pain with ambulation. Session focused on gait training & balance. Pt ambulated room<>gym with RW & supervision, occasionally hitting RW with RLE during turns with cuing from therapist to ambulate within base of RW with poor demo by pt. Pt stood on compliant surface with only 1UE support & min assist fading to close supervision for balance training. Pt assembled pipe tree shapes with BUE focusing on functional use of RUE and occasional cuing to select correct pieces for shape assembly. Pt reported need to use bathroom & completed toilet transfer with supervision, (+) void. At end of session pt left sitting in w/c with QRB in place & all needs within reach.   Treatment 2: Pt received in bed and reluctantly agreeable to tx. Pt denied c/o pain but reported fatigue and "feeling lazy" today. Session focused on functional mobility in apartment (bed mobility, couch transfers), which pt completed with supervision overall. Pt ambulated throughout unit with RW & supervision, kicking RW with RLE during turns. Therapist provided cuing for position within RW but pt with poor demo. Pt resistant to education on this date as pt repeatedly states "I'm tired". Pt completed car transfer at SUV simulated height with supervision. Utilize nu-step level 3 x 11 minutes with 3 rest breaks 2/2 fatigue; activity focused on coordination of reciprocal movements, endurance  training, and functional use of RUE. Encouraged pt to maintain 30-40 steps per minute for endurance training purposes but pt unable. During session pt utilize toilet with continent void. Encouraged pt to bring in shoes with better fit as she is currently wearing husband's slippers that are too big for her. At end of session pt left sitting on edge of bed with all needs within reach & alarm set.  During session pt participated in animal therapy, utilizing RUE to pet dog. Pt also demonstrates very poor eccentric control with stand>sit, requiring max cuing for safety.   Therapy Documentation Precautions:  Precautions Precautions: Fall Precaution Comments:  (Monitor BP) Restrictions Weight Bearing Restrictions: No   General: PT Amount of Missed Time (min): 18 Minutes PT Missed Treatment Reason:  (breakfast)   See Function Navigator for Current Functional Status.   Therapy/Group: Individual Therapy  Waunita Schooner 06/14/2016, 1:23 PM

## 2016-06-15 ENCOUNTER — Inpatient Hospital Stay (HOSPITAL_COMMUNITY): Payer: Medicaid Other | Admitting: Physical Therapy

## 2016-06-15 ENCOUNTER — Inpatient Hospital Stay (HOSPITAL_COMMUNITY): Payer: Medicaid Other

## 2016-06-15 ENCOUNTER — Inpatient Hospital Stay (HOSPITAL_COMMUNITY): Payer: Medicaid Other | Admitting: Speech Pathology

## 2016-06-15 DIAGNOSIS — I610 Nontraumatic intracerebral hemorrhage in hemisphere, subcortical: Secondary | ICD-10-CM

## 2016-06-15 LAB — GLUCOSE, CAPILLARY
GLUCOSE-CAPILLARY: 128 mg/dL — AB (ref 65–99)
GLUCOSE-CAPILLARY: 113 mg/dL — AB (ref 65–99)
Glucose-Capillary: 149 mg/dL — ABNORMAL HIGH (ref 65–99)
Glucose-Capillary: 111 mg/dL — ABNORMAL HIGH (ref 65–99)

## 2016-06-15 NOTE — Progress Notes (Signed)
*  PRELIMINARY RESULTS* Vascular Ultrasound Carotid Duplex (Doppler) has been completed.  Preliminary findings: .Bilateral 1-39% ICA stenosis, moderate amount of soft plaque seen in proximal right ICA, distal left ICA not seen due to high bifurcation. Bilateral vertebral arteries are patent and antegrade.   [name] 06/15/2016, 3:40 PM

## 2016-06-15 NOTE — Progress Notes (Signed)
Physical Therapy Note  Patient Details  Name: [name] MRN: [id number] Date of Birth: [birth year] Today's Date: 06/15/2016    Attempted to make up missed minutes from previous day, pt declining stating that she is fatigued from a full day of therapies.     Dwyane Dee, PT, DPT 06/15/2016, 5:26 PM

## 2016-06-15 NOTE — Progress Notes (Signed)
Speech Language Pathology Daily Session Note  Patient Details  Name: [name] MRN: [id number] Date of Birth: 09-06-1955  Today's Date: 06/15/2016 SLP Individual Time: [id number] [id number] Individual Time Calculation (min): 55 min   Short Term Goals: Week 1: SLP Short Term Goal 1 (Week 1): Pt will utilize external memory aids to recall new, daily information with Min A verbal and question cues.  SLP Short Term Goal 2 (Week 1): Pt will consume tirals of thin liquids with minimal overt s/s of aspiration over 2 sessions to asssess readiness for repeat MBS with supervision verbal cues.  SLP Short Term Goal 3 (Week 1): Pt will consume dysphagia 2 diet with no overt s/s of aspiration over 3 sessions to assess readiness for diet upgrade with supervision verbal cues.   Skilled Therapeutic Interventions: Skilled treatment session focused on cognitive and dysphagia goals. Upon arrival, patient was asleep in bed and required extra time for arousal. SLP facilitated session by providing extra time and supervision verbal cues for safety with transfer in the wheelchair. Patient also expressing that therapy "treats her like a baby." Patient re-educated in regards to her current deficits and their impact on her overall functional independence. Patient performed oral care at the sink with Mod I and consumed trials of ice chips with overt s/s of aspiration in 50% of trials. However, patient demonstrates a cough at baseline which makes it difficult to differentiate. Recommend repeat MBS to assess swallow function. Patient requesting soda and consumed nectar-thick diet coke without overt s/s of aspiration. Throughout session, patient required overall supervision verbal cues for use of swallowing compensatory strategies. Patient handed off to PT. Continue with current plan of care.   Function:  Eating Eating   Modified Consistency Diet: Yes Eating Assist Level: Supervision or verbal cues;More than reasonable amount  of time           Cognition Comprehension Comprehension assist level: Follows basic conversation/direction with extra time/assistive device  Expression   Expression assist level: Expresses basic 90% of the time/requires cueing < 10% of the time.  Social Interaction Social Interaction assist level: Interacts appropriately with others - No medications needed.  Problem Solving Problem solving assist level: Solves complex 90% of the time/cues < 10% of the time  Memory Memory assist level: Recognizes or recalls 90% of the time/requires cueing < 10% of the time    Pain No/Denies Pain   Therapy/Group: Individual Therapy  Ivar Domangue 06/15/2016, 3:20 PM

## 2016-06-15 NOTE — Progress Notes (Signed)
[place] PHYSICAL MEDICINE & REHABILITATION     PROGRESS NOTE    Subjective/Complaints: Up at EOB. Feeling well. Asked when she's going home.  ROS: pt denies nausea, vomiting, diarrhea, cough, shortness of breath or chest pain    Objective: Vital Signs: Blood pressure (!) 129/52, pulse 88, temperature 98.9 F (37.2 C), temperature source Oral, resp. rate 17, height 5\' 3"  (1.6 m), weight 94.2 kg (207 lb 10.8 oz), SpO2 92 %. No results found. No results for input(s): WBC, HGB, HCT, PLT in the last 72 hours. No results for input(s): NA, K, CL, GLUCOSE, BUN, CREATININE, CALCIUM in the last 72 hours.  Invalid input(s): CO CBG (last 3)   Recent Labs  06/14/16 1639 06/14/16 2124 06/15/16 0652  GLUCAP 77 96 128*    Wt Readings from Last 3 Encounters:  06/14/16 94.2 kg (207 lb 10.8 oz)  06/07/16 93.4 kg (205 lb 14.6 oz)  01/14/16 101.2 kg (223 lb)    Physical Exam:  Constitutional: She appears well-developed.  Eyes: PERRL.    Neck: suppple.  Cardiovascular: RRR Respiratory: clear with decreased sounds at right base GI: Soft.   Neurological: She is alert.  Alert. Speech is still dysarthric. Reasonable insight and awareness. Follows all commands. . Continued CN 7. Marland KitchenRUE remains 4-/5 bicep, tricep, wrist, hand. RLE 4 to 4+/5 prox to distal. LUE 5/5. LLE 5/5. No sensory findings  Skin: intact.  Psychiatric: pleasant and cooperative  Assessment/Plan: 1. 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 Physiatrist is providing close team supervision and 24 hour management of active medical problems listed below. Physiatrist and rehab team continue to assess barriers to discharge/monitor patient progress toward functional and medical goals.  Function:  Bathing Bathing position   Position: Shower  Bathing parts Body parts bathed by patient:  Right arm, Left arm, Chest, Abdomen, Front perineal area, Buttocks, Right upper leg, Left upper leg, Right lower leg, Left lower leg Body parts bathed by helper: Back  Bathing assist Assist Level: Supervision or verbal cues      Upper Body Dressing/Undressing Upper body dressing   What is the patient wearing?: Pull over shirt/dress     Pull over shirt/dress - Perfomed by patient: Thread/unthread right sleeve, Thread/unthread left sleeve, Put head through opening, Pull shirt over trunk          Upper body assist Assist Level: Supervision or verbal cues      Lower Body Dressing/Undressing Lower body dressing   What is the patient wearing?: Underwear, Pants, Non-skid slipper socks Underwear - Performed by patient: Thread/unthread right underwear leg, Thread/unthread left underwear leg, Pull underwear up/down   Pants- Performed by patient: Thread/unthread right pants leg, Thread/unthread left pants leg, Pull pants up/down Pants- Performed by helper: Pull pants up/down Non-skid slipper socks- Performed by patient: Don/doff right sock, Don/doff left sock Non-skid slipper socks- Performed by helper: Don/doff right sock, Don/doff left sock       Shoes - Performed by helper: Don/doff right shoe, Don/doff left shoe          Lower body assist Assist for lower body dressing: Touching or steadying assistance (Pt > 75%)      Toileting Toileting   Toileting steps completed by patient: Adjust clothing prior to toileting, Performs perineal hygiene, Adjust clothing after toileting Toileting steps completed by helper: Adjust clothing prior to toileting, Adjust clothing after toileting, Performs perineal hygiene Toileting  Assistive Devices: Grab bar or Photographer level: Supervision or verbal cues   Transfers Chair/bed transfer   Chair/bed transfer method: Ambulatory Chair/bed transfer assist level: Touching or steadying assistance (Pt > 75%) Chair/bed transfer  assistive device: Armrests, Medical sales representative     Max distance: 150 ft Assist level: Supervision or verbal cues   Wheelchair   Type: Manual Max wheelchair distance: 125ft  Assist Level: Supervision or verbal cues  Cognition Comprehension Comprehension assist level: Follows basic conversation/direction with extra time/assistive device  Expression Expression assist level: Expresses basic 90% of the time/requires cueing < 10% of the time.  Social Interaction Social Interaction assist level: Interacts appropriately with others - No medications needed.  Problem Solving Problem solving assist level: Solves complex 90% of the time/cues < 10% of the time  Memory Memory assist level: More than reasonable amount of time   Medical Problem List and Plan:  1. 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 DVT Prophylaxis/Anticoagulation: SCD's 3. Pain Management/chronic back pain: Ultram 50 mg daily as needed as well as Flexeril. Patient had been on Neurontin 600 mg 4 times a day, hydrocodone as needed prior to admission.  4. Dysphagia. Dysphagia #2 nectar thick liquids.   --encourage PO  -water protocol 5. Neuropsych: This patient is capable of making decisions on her own behalf.  6. Skin/Wound Care: Routine skin checks  7. Fluids/Electrolytes/Nutrition: encourage PO fluids. Has reasonable appetite  8. Diabetes mellitus with peripheral neuropathy. Hemoglobin A1c 11.4.  -have  70/30 to 50u am and 30u pm to avoid morning lows  -sugars look better this morning. Hopeful they will level out today    9. Seizure prophylaxis. Keppra 1000 mg every 12 hours. EEG negative  10. Hypertension. Lisinopril 20 mg daily, HCTZ 12.5 mg daily.   -improved control-  -stopped hctz due to nectar fluids/CRI  -increased metoprolol to 25mg  bid  11. Hyperlipidemia. Patient on Zocor 10 mg daily at  bedtime prior to admission.  12. Tobacco/cocaine abuse. Nicoderm patch. Provide counseling  13. Hypokalemia. Potassium supplement   14. Question acute on chronic renal insufficiency. Baseline creatinine 1.74 on admission his creatinine 1.38. Cr stable to improved 15. Wet/productive cough---COPD  -improved with mucinex  -continue IS,FV   LOS (Days) 6 A FACE TO FACE EVALUATION WAS PERFORMED  SWARTZ,ZACHARY T 06/15/2016 8:56 AM

## 2016-06-15 NOTE — Progress Notes (Signed)
Physical Therapy Note  Patient Details  Name: [name] MRN: [id number] Date of Birth: 18-Jun-1956 Today's Date: 06/15/2016    Time: 1125-1155 30 minutes  1:1 No c/o pain. Gait in controlled environment with RW with close supervision, 2 x min A for LOB posteriorly when distracted.  Pt with decreased instances of Rt foot hitting RW today.  Gait with obstacle negotiation with mod cues for safety and technique, little carry over throughout session.  Standing balance with card matching task for Rt UE use with supervision for static standing balance.  Seated clothespin activity for Rt UE use with pt able to perform all clothespins with increased time.  Time 2: 1300-1340 40 minutes  1:1 No c/o pain.  Nu step per pt request level 3 x 11 minutes for UE/LE strength and endurance.  Standing balance on foam for peg task with pt with improved use of Rt UE today, close supervision for balance on foam.  Standing therex for hip and LE strengthening with RW for UE support and balance. Pt with improved activity tolerance, still with decreased and delayed balance reactions   Chevella Pearce 06/15/2016, 1:08 PM

## 2016-06-15 NOTE — Progress Notes (Signed)
Nutrition Follow-up  DOCUMENTATION CODES:   Obesity unspecified  INTERVENTION:  Continue Glucerna Shake po BID (thickened to appropriate consistency), each supplement provides 220 kcal and 10 grams of protein.  Encourage adequate PO intake.   NUTRITION DIAGNOSIS:   Swallowing difficulty related to  (ICH) as evidenced by  (Need for D2, NTL diet); ongoing  GOAL:   Patient will meet greater than or equal to 90% of their needs; met  MONITOR:   PO intake, Supplement acceptance, Diet advancement, Labs  REASON FOR ASSESSMENT:   Malnutrition Screening Tool    ASSESSMENT:   60 y/o female PMHx DM, HTN, Chronic back pain, Tobacco/cocaine abuse. Presented to hospital 11/28  With tremors/weakness. Evaluated and found to have suffered ICH. Presented to in patient rehab  Pt was consuming her lunch during time of visit. Meal completion has been varied from 50-100% however recent intake has been 95-100%. Pt currently has Glucerna Shake ordered with varied intake. RD to continue with current orders to aid in caloric and protein needs.   Diet Order:  DIET DYS 2 Room service appropriate? Yes; Fluid consistency: Nectar Thick  Skin:  Reviewed, no issues  Last BM:  12/6  Height:   Ht Readings from Last 1 Encounters:  06/09/16 5' 3" (1.6 m)    Weight:   Wt Readings from Last 1 Encounters:  06/14/16 207 lb 10.8 oz (94.2 kg)    Ideal Body Weight:  52.27 kg  BMI:  Body mass index is 36.79 kg/m.  Estimated Nutritional Needs:   Kcal:  1700-1900  Protein:  80-95 grams  Fluid:  1.7 - 1.9 L/day  EDUCATION NEEDS:   Education needs no appropriate at this time   , MS, RD, LDN Pager # [phone] After hours/ weekend pager # [phone]  

## 2016-06-15 NOTE — Progress Notes (Signed)
Occupational Therapy Session Note  Patient Details  Name: [name] MRN: [id number] Date of Birth: 1956/06/15  Today's Date: 06/15/2016 OT Individual Time: 0800-0900 OT Individual Time Calculation (min): 60 min     Short Term Goals: Week 1:  OT Short Term Goal 1 (Week 1): Pt will complete bathing with supervision  OT Short Term Goal 2 (Week 1): Pt will complete toilet transfer with LRAD and consistent Min A OT Short Term Goal 3 (Week 1): Pt will complete LB dressing with Min A sit<stand OT Short Term Goal 4 (Week 1): Pt will complete dynamic standing task for 4 minutes without rest breaks for improving balance during ADLs  Skilled Therapeutic Interventions/Progress Updates:    Pt resting in bed upon arrival and agreeable to participating in therapy.  Pt engaged in BADL retraining including bathing at shower level and dressing with sit<>stand from EOB.  Pt amb with RW to gather clothing and supplies prior to amb into bathroom for shower.  Pt completed all bathing/dressing tasks at supervision level with min verbal cues for safety awareness with RW and ambulation. Focus on activity tolerance, sit<>stand, functional amb with RW, standing balance, and safety awareness to increase independence with BADLs.  Therapy Documentation Precautions:  Precautions Precautions: Fall Precaution Comments:  (Monitor BP) Restrictions Weight Bearing Restrictions: No Pain:  Pt denied pain ADL: ADL ADL Comments: Please see functional navigator for ADL status  See Function Navigator for Current Functional Status.   Therapy/Group: Individual Therapy  Leroy Libman 06/15/2016, 9:02 AM

## 2016-06-16 ENCOUNTER — Inpatient Hospital Stay (HOSPITAL_COMMUNITY): Payer: Medicaid Other | Admitting: Speech Pathology

## 2016-06-16 ENCOUNTER — Inpatient Hospital Stay (HOSPITAL_COMMUNITY): Payer: Medicaid Other

## 2016-06-16 ENCOUNTER — Inpatient Hospital Stay (HOSPITAL_COMMUNITY): Payer: Medicaid Other | Admitting: Physical Therapy

## 2016-06-16 DIAGNOSIS — R1312 Dysphagia, oropharyngeal phase: Secondary | ICD-10-CM

## 2016-06-16 LAB — GLUCOSE, CAPILLARY
GLUCOSE-CAPILLARY: 44 mg/dL — AB (ref 65–99)
GLUCOSE-CAPILLARY: 93 mg/dL (ref 65–99)
Glucose-Capillary: 159 mg/dL — ABNORMAL HIGH (ref 65–99)
Glucose-Capillary: 303 mg/dL — ABNORMAL HIGH (ref 65–99)
Glucose-Capillary: 309 mg/dL — ABNORMAL HIGH (ref 65–99)

## 2016-06-16 LAB — VAS US CAROTID
LCCADDIAS: -10 cm/s
LCCADSYS: -62 cm/s
LEFT ECA DIAS: -9 cm/s
LEFT VERTEBRAL DIAS: -11 cm/s
LICAPDIAS: -15 cm/s
LICAPSYS: -75 cm/s
Left CCA prox dias: 14 cm/s
Left CCA prox sys: 77 cm/s
RCCADSYS: -79 cm/s
RIGHT ECA DIAS: -9 cm/s
RIGHT VERTEBRAL DIAS: -18 cm/s
Right CCA prox dias: 14 cm/s
Right CCA prox sys: 89 cm/s

## 2016-06-16 NOTE — Progress Notes (Signed)
SLP Cancellation Note  Patient Details Name: [name] MRN: [id number] DOB: 1956/04/20   Cancelled treatment:       Patient missed 30 minutes of skilled SLP intervention. Patient declined to participate due to fatigue.                                                                                                Jolyssa Oplinger 06/16/2016, 2:47 PM

## 2016-06-16 NOTE — Progress Notes (Signed)
[place] PHYSICAL MEDICINE & REHABILITATION     PROGRESS NOTE    Subjective/Complaints: Had a good night. Denies pain. Minimal cough. Tolerating water protocol  ROS: pt denies nausea, vomiting, diarrhea, cough, shortness of breath or chest pain     Objective: Vital Signs: Blood pressure (!) 164/84, pulse 84, temperature 98.6 F (37 C), temperature source Oral, resp. rate 16, height 5\' 3"  (1.6 m), weight 94.2 kg (207 lb 10.8 oz), SpO2 98 %. No results found. No results for input(s): WBC, HGB, HCT, PLT in the last 72 hours. No results for input(s): NA, K, CL, GLUCOSE, BUN, CREATININE, CALCIUM in the last 72 hours.  Invalid input(s): CO CBG (last 3)   Recent Labs  06/15/16 1630 06/15/16 2129 06/16/16 0658  GLUCAP 149* 111* 159*    Wt Readings from Last 3 Encounters:  06/14/16 94.2 kg (207 lb 10.8 oz)  06/07/16 93.4 kg (205 lb 14.6 oz)  01/14/16 101.2 kg (223 lb)    Physical Exam:  Constitutional: She appears well-developed.  Eyes: PERRL.    Neck: suppple.  Cardiovascular: RRR Respiratory: still with decreased sounds right base GI: Soft.   Neurological: She is alert.  Alert. Speech remains dysarthric. Reasonable insight and awareness. Follows all commands. . Continued CN 7. Marland KitchenRUE remains 4-/5 bicep, tricep, wrist, hand. RLE 4 to 4+/5 prox to distal. LUE 5/5. LLE 5/5. No sensory findings  Skin: intact.  Psychiatric: pleasant and cooperative  Assessment/Plan: 1. 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 Physiatrist is providing close team supervision and 24 hour management of active medical problems listed below. Physiatrist and rehab team continue to assess barriers to discharge/monitor patient progress toward functional and medical goals.  Function:  Bathing Bathing position   Position: Shower  Bathing parts Body parts  bathed by patient: Right arm, Left arm, Chest, Abdomen, Front perineal area, Buttocks, Right upper leg, Left upper leg, Right lower leg, Left lower leg Body parts bathed by helper: Back  Bathing assist Assist Level: Supervision or verbal cues      Upper Body Dressing/Undressing Upper body dressing   What is the patient wearing?: Pull over shirt/dress     Pull over shirt/dress - Perfomed by patient: Thread/unthread right sleeve, Thread/unthread left sleeve, Put head through opening, Pull shirt over trunk          Upper body assist Assist Level: Supervision or verbal cues      Lower Body Dressing/Undressing Lower body dressing   What is the patient wearing?: Underwear, Pants, Non-skid slipper socks Underwear - Performed by patient: Thread/unthread right underwear leg, Thread/unthread left underwear leg, Pull underwear up/down   Pants- Performed by patient: Thread/unthread right pants leg, Thread/unthread left pants leg, Pull pants up/down Pants- Performed by helper: Pull pants up/down Non-skid slipper socks- Performed by patient: Don/doff right sock, Don/doff left sock Non-skid slipper socks- Performed by helper: Don/doff right sock, Don/doff left sock       Shoes - Performed by helper: Don/doff right shoe, Don/doff left shoe          Lower body assist Assist for lower body dressing: Supervision or verbal cues      Toileting Toileting   Toileting steps completed by patient: Adjust clothing prior to toileting, Performs perineal hygiene, Adjust clothing after toileting Toileting steps completed by helper: Adjust clothing prior to toileting, Adjust clothing after toileting, Performs perineal hygiene Toileting Assistive Devices: Grab  bar or rail  Toileting assist Assist level: Supervision or verbal cues   Transfers Chair/bed transfer   Chair/bed transfer method: Ambulatory Chair/bed transfer assist level: Touching or steadying assistance (Pt > 75%) Chair/bed transfer  assistive device: Armrests, Medical sales representative     Max distance: 150 ft Assist level: Supervision or verbal cues   Wheelchair   Type: Manual Max wheelchair distance: 153ft  Assist Level: Supervision or verbal cues  Cognition Comprehension Comprehension assist level: Follows basic conversation/direction with extra time/assistive device  Expression Expression assist level: Expresses basic 90% of the time/requires cueing < 10% of the time.  Social Interaction Social Interaction assist level: Interacts appropriately with others - No medications needed.  Problem Solving Problem solving assist level: Solves complex 90% of the time/cues < 10% of the time  Memory Memory assist level: Recognizes or recalls 90% of the time/requires cueing < 10% of the time   Medical Problem List and Plan:  1. 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 DVT Prophylaxis/Anticoagulation: SCD's 3. Pain Management/chronic back pain: Ultram 50 mg daily as needed as well as Flexeril. Patient had been on Neurontin 600 mg 4 times a day, hydrocodone as needed prior to admission.  4. Dysphagia. Dysphagia #2 nectar thick liquids.   --encourage PO  -continue water protocol 5. Neuropsych: This patient is capable of making decisions on her own behalf.  6. Skin/Wound Care: Routine skin checks  7. Fluids/Electrolytes/Nutrition: encourage PO fluids. Has reasonable appetite   -recheck labs monday 8. Diabetes mellitus with peripheral neuropathy. Hemoglobin A1c 11.4.  -have adjusted  70/30 to 50u am and 30u pm to avoid morning lows  -sugars much improved yesterday---continue current regimen   9. Seizure prophylaxis. Keppra 1000 mg every 12 hours. EEG negative  10. Hypertension. Lisinopril 20 mg daily, HCTZ 12.5 mg daily.   -improved control overall-  -stopped hctz due to nectar  fluids/CRI  -increased metoprolol to 25mg  bid  11. Hyperlipidemia. Patient on Zocor 10 mg daily at bedtime prior to admission.  12. Tobacco/cocaine abuse. Nicoderm patch. Provide counseling  13. Hypokalemia. Potassium supplement   14. Question acute on chronic renal insufficiency. Baseline creatinine 1.74 on admission his creatinine 1.38. Cr stable to improved 15. Wet/productive cough---COPD  -improved with mucinex  -continue IS,FV   LOS (Days) 7 A FACE TO FACE EVALUATION WAS PERFORMED  Lark Langenfeld T 06/16/2016 10:12 AM

## 2016-06-16 NOTE — Progress Notes (Signed)
Speech Language Pathology Weekly Progress Note  Patient Details  Name: [name] MRN: [id number] Date of Birth: [birth year]  Beginning of progress report period: June 09, 2016 End of progress report period: June 16, 2016   Short Term Goals: Week 1: SLP Short Term Goal 1 (Week 1): Pt will utilize external memory aids to recall new, daily information with Min A verbal and question cues.  SLP Short Term Goal 1 - Progress (Week 1): Met SLP Short Term Goal 2 (Week 1): Pt will consume tirals of thin liquids with minimal overt s/s of aspiration over 2 sessions to asssess readiness for repeat MBS with supervision verbal cues.  SLP Short Term Goal 2 - Progress (Week 1): Met     New Short Term Goals: Week 2: SLP Short Term Goal 1 (Week 2): Patient will consume dysphagia 3 textures with nectar-thick liquids with minimal overt s/s of aspiration diet with no overt s/s of aspiration with supervision verbal cues for use of swallowing compensatory strategies SLP Short Term Goal 2 (Week 2): Patient will utilize supraglottic swallow with trials of thin liquids with 90% accuracy and minimal overt s/s of aspiration over 3 sessions prior to upgrade.   Weekly Progress Updates: Patient has made functional gains and has met 2 of 2 STG's this reporting period due to improved cognitive and swallowing function. Currently, patient is Mod I to complete functional and familiar tasks safely in regards to recall. Patient had been consuming of ice chips with minimal overt s/s of aspiration. Therefore, repeat MBS was administered today which showed continued aspiration of thin liquids, therefore, patient will remain on nectar-thick liquids but will upgrade to Dys. 3 textures. Patient would benefit from continued skilled SLP intervention to maximize her swallowing function prior to discharge home.    Intensity: Minumum of 1-2 x/day, 30 to 90 minutes Frequency: 3 to 5 out of 7 days Duration/Length of Stay:  12/12 Treatment/Interventions: Functional tasks;Environmental controls;Dysphagia/aspiration precaution training;Patient/family education;Therapeutic Activities     [place], [place] 06/16/2016, 2:43 PM

## 2016-06-16 NOTE — Progress Notes (Signed)
Occupational Therapy Note  Patient Details  Name: [name] MRN: [id number] Date of Birth: Jun 20, 1956  Today's Date: 06/16/2016 OT Missed Time: 19 Minutes Missed Time Reason: Patient unwilling/refused to participate without medical reason   Pt missed 60 mins skilled OT services.  Pt resting in bed upon arrival and stated that she couldn't "do anything" this afternoon.  Pt concerned about new L facial pain and ongoing periodic nose bleeds.  RN aware. CSW notifed and Neuropsych consult scheduled for coping.  Leotis Shames [facility] 06/16/2016, 1:50 PM

## 2016-06-16 NOTE — Progress Notes (Signed)
Occupational Therapy Session Note  Patient Details  Name: [name] MRN: [id number] Date of Birth: 1956/04/16  Today's Date: 06/16/2016 OT Individual Time: 1030-1100 OT Individual Time Calculation (min): 30 min     Short Term Goals: Week 2:  OT Short Term Goal 1 (Week 2): STG=LTG secondary to ELOS  Skilled Therapeutic Interventions/Progress Updates:    Pt resting in bed upon arrival and agreeable to therapy.  Pt c/o continued fatigue.  Pt amb with RW to day room and engaged in functional amb with RW for simple home mgmt tasks.  Pt very emotional today and discouraged because she is still on nectar thick liquids and new pain in her left face.  Pt apologetic and continued with therapy.  Pt returned to room and remained in bed with all needs within reach and bed alarm activated.  Therapy Documentation Precautions:  Precautions Precautions: Fall Precaution Comments:  (Monitor BP) Restrictions Weight Bearing Restrictions: No Pain:  Pt c/o pain on left face; MD notified  See Function Navigator for Current Functional Status.   Therapy/Group: Individual Therapy  Leroy Libman 06/16/2016, 12:05 PM

## 2016-06-16 NOTE — Progress Notes (Signed)
Occupational Therapy Weekly Progress Note  Patient Details  Name: [name] MRN: [id number] Date of Birth: 1955/10/15  Beginning of progress report period: June 09, 2016 End of progress report period: June 16, 2016  Patient has met 4 of 4 short term goals.  Pt made good progress with BADLs since admission.  Pt completes bathing and dressing tasks with supervision.  Pt continues to requires min verbal cues for safety awareness when amb with RW.  Pt has been issued a RW bag and educated on RW safety but continues to require min verbal cues to not carry items in her hands when using RW.   Patient continues to demonstrate the following deficits: muscle weakness, decreased coordination, decreased safety awareness and decreased memory and decreased standing balance, decreased postural control and hemiplegia and therefore will continue to benefit from skilled OT intervention to enhance overall performance with BADL.  Patient progressing toward long term goals of mod I with self care. Discontinued LTGs of meal prep, laundry, housekeeping as pt will have family staying with her do those tasks.  Continue plan of care.  OT Short Term Goals Week 1:  OT Short Term Goal 1 (Week 1): Pt will complete bathing with supervision  OT Short Term Goal 1 - Progress (Week 1): Met OT Short Term Goal 2 (Week 1): Pt will complete toilet transfer with LRAD and consistent Min A OT Short Term Goal 2 - Progress (Week 1): Met OT Short Term Goal 3 (Week 1): Pt will complete LB dressing with Min A sit<stand OT Short Term Goal 3 - Progress (Week 1): Met OT Short Term Goal 4 (Week 1): Pt will complete dynamic standing task for 4 minutes without rest breaks for improving balance during ADLs OT Short Term Goal 4 - Progress (Week 1): Met Week 2:  OT Short Term Goal 1 (Week 2): STG=LTG secondary to ELOS     Therapy Documentation Precautions:  Precautions Precautions: Fall Precaution Comments:  (Monitor  BP) Restrictions Weight Bearing Restrictions: No  See Function Navigator for Current Functional Status.    Leotis Shames [facility] 06/16/2016, 6:33 AM

## 2016-06-16 NOTE — Progress Notes (Signed)
Hypoglycemic Event  CBG: 44  Treatment: 15 GM carbohydrate snack  Symptoms: Shaky and Hungry  Follow-up CBG: Time:1732 CBG Result:98  Possible Reasons for Event: Vomiting and Inadequate meal intake  Comments/MD notified: Pt ate dinner and feeling much better now after CBG was rechecked.      Tere Mcconaughey S

## 2016-06-16 NOTE — Procedures (Signed)
Objective Swallowing Evaluation: Type of Study: MBS-Modified Barium Swallow Study  Patient Details  Name: [name] MRN: [id number] Date of Birth: April 05, 1956  Today's Date: 06/16/2016 Time: SLP Start Time (ACUTE ONLY): 1600-SLP Stop Time (ACUTE ONLY): 1616 SLP Time Calculation (min) (ACUTE ONLY): 16 min  Past Medical History:  Past Medical History:  Diagnosis Date  . Diabetes mellitus    Hb A1C = 12.6 on 05/15/11, managed on Novolog 70/30, 35 U qam, 25 U qpm  . Hypertension    poorly controlled  . Shortness of breath    Past Surgical History:  Past Surgical History:  Procedure Laterality Date  . ANKLE FRACTURE SURGERY  2007   HPI: 63-yo woman with history of DM, poorly controlled,HTN, SOB, currently uses crack cocaine admitted with tremors/contractions of the L hand and face, trouble walking with weakness of the L hand. Pt had seizure while in CT scanner. CT showed a pontine hemorrhage. Per MD notes sh has been having trouble swallowing and gets choked by both solids and liquids. CXR No active lung disease. Degenerative change in both shoulders.  No Data Recorded   Assessment / Plan / Recommendation  CHL IP CLINICAL IMPRESSIONS 06/16/2016  Therapy Diagnosis Moderate pharyngeal phase dysphagia;Severe pharyngeal phase dysphagia;Mild oral phase dysphagia  Clinical Impression Pt demonstrates adequate mastication of solids, but ongoing delayed swallow with decreased laryngeal closure, leading to penetration before the swallow, typically with at least trace aspiration as swallow is initiated. This occurs significantly with thin liquids and in trace amounts with nectar thick liquids. Pts sensation is delayed and unreliable; she often needed verbal cues to clear throat and in one case did not sense significant aspiration. SLP attempts a variety of postures and strategies; the most effective of these was a supgraglottic swallow with small thin boluses. The pt was able to better protect  airway with a breath hold as swallow was initiated. A cued throat clear was also helpful to expel any trace aspiration or penetration. She will need further training with this as she was not able to carry over independently. She also particularly struggled with dry swallow initiation to clear min pharyngeal residuals. Recommend upgrade to dys 3 mech soft with nectar thick liquids with further SLP therapy.   Impact on safety and function Moderate aspiration risk      CHL IP TREATMENT RECOMMENDATION 06/16/2016  Treatment Recommendations Therapy as outlined in treatment plan below     Prognosis 06/16/2016  Prognosis for Safe Diet Advancement Good  Barriers to Reach Goals --  Barriers/Prognosis Comment --    CHL IP DIET RECOMMENDATION 06/16/2016  SLP Diet Recommendations Dysphagia 3 (Mech soft) solids;Thin liquid;Nectar thick liquid  Liquid Administration via Cup  Medication Administration Whole meds with puree  Compensations Slow rate;Small sips/bites;Clear throat after each swallow;Other (Comment)  Postural Changes Seated upright at 90 degrees      CHL IP OTHER RECOMMENDATIONS 06/16/2016  Recommended Consults --  Oral Care Recommendations Oral care BID  Other Recommendations --      CHL IP FOLLOW UP RECOMMENDATIONS 06/16/2016  Follow up Recommendations Inpatient Rehab      CHL IP FREQUENCY AND DURATION 06/07/2016  Speech Therapy Frequency (ACUTE ONLY) min 2x/week  Treatment Duration 2 weeks           CHL IP ORAL PHASE 06/16/2016  Oral Phase Impaired  Oral - Pudding Teaspoon --  Oral - Pudding Cup --  Oral - Honey Teaspoon --  Oral - Honey Cup NT  Oral - Nectar Teaspoon --  Oral - Nectar Cup Lingual/palatal residue  Oral - Nectar Straw --  Oral - Thin Teaspoon --  Oral - Thin Cup Lingual/palatal residue  Oral - Thin Straw --  Oral - Puree WFL  Oral - Mech Soft --  Oral - Regular WFL  Oral - Multi-Consistency --  Oral - Pill WFL  Oral Phase - Comment --    CHL IP  PHARYNGEAL PHASE 06/16/2016  Pharyngeal Phase Impaired  Pharyngeal- Pudding Teaspoon --  Pharyngeal --  Pharyngeal- Pudding Cup --  Pharyngeal --  Pharyngeal- Honey Teaspoon --  Pharyngeal --  Pharyngeal- Honey Cup NT  Pharyngeal --  Pharyngeal- Nectar Teaspoon --  Pharyngeal --  Pharyngeal- Nectar Cup Delayed swallow initiation-pyriform sinuses;Penetration/Aspiration before swallow;Penetration/Aspiration during swallow;Pharyngeal residue - valleculae;Pharyngeal residue - pyriform;Trace aspiration  Pharyngeal Material enters airway, CONTACTS cords and then ejected out;Material enters airway, CONTACTS cords and not ejected out;Material enters airway, passes BELOW cords and not ejected out despite cough attempt by patient;Material enters airway, passes BELOW cords then ejected out  Pharyngeal- Nectar Straw --  Pharyngeal --  Pharyngeal- Thin Teaspoon --  Pharyngeal --  Pharyngeal- Thin Cup Delayed swallow initiation-pyriform sinuses;Penetration/Aspiration before swallow;Penetration/Aspiration during swallow;Pharyngeal residue - valleculae;Pharyngeal residue - pyriform;Moderate aspiration;Trace aspiration;Penetration/Apiration after swallow;Compensatory strategies attempted (with notebox)  Pharyngeal Material enters airway, passes BELOW cords without attempt by patient to eject out (silent aspiration);Material enters airway, passes BELOW cords and not ejected out despite cough attempt by patient;Material enters airway, passes BELOW cords then ejected out;Material enters airway, CONTACTS cords and not ejected out;Material enters airway, CONTACTS cords and then ejected out;Material does not enter airway  Pharyngeal- Thin Straw --  Pharyngeal --  Pharyngeal- Puree Delayed swallow initiation-pyriform sinuses;WFL  Pharyngeal --  Pharyngeal- Mechanical Soft --  Pharyngeal --  Pharyngeal- Regular Delayed swallow initiation-vallecula;WFL  Pharyngeal --  Pharyngeal- Multi-consistency --  Pharyngeal  --  Pharyngeal- Pill Delayed swallow initiation-vallecula;WFL  Pharyngeal --  Pharyngeal Comment --     CHL IP CERVICAL ESOPHAGEAL PHASE 06/07/2016  Cervical Esophageal Phase WFL  Pudding Teaspoon --  Pudding Cup --  Honey Teaspoon --  Honey Cup --  Nectar Teaspoon --  Nectar Cup --  Nectar Straw --  Thin Teaspoon --  Thin Cup --  Thin Straw --  Puree --  Mechanical Soft --  Regular --  Multi-consistency --  Pill --  Cervical Esophageal Comment --    No flowsheet data found.  Tyse Auriemma, Katherene Ponto 06/16/2016, 12:12 PM

## 2016-06-16 NOTE — Progress Notes (Signed)
Physical Therapy Note  Patient Details  Name: [name] MRN: [id number] Date of Birth: 03-12-1956 Today's Date: 06/16/2016    Time: [id number] 55 minutes  1:1 No c/o pain. Pt in bed upon PT arrival, agreeable to get up and eat breakfast and shower. Pt did not want to participate in therapy before breakfast or shower.  Pt able to stand pivot transfer to w/c with close supervision, cues for safety. Eating breakfast with cues for drinking after 2-3 bites and with cough noticed throughout meal.  Gait without AD with min A to shower. Pt able to doff clothes with min A for standing balance with 1 UE support, close supervision for standing to shower with 1 UE support. Pt encouraged to sit for as much of shower as possible for safety, pt agrees.  Pt able to perform drying and dressing with min A for standing balance, supervision when seated. Pt improving use of Rt UE in all tasks.     Aralynn Brake 06/16/2016, 8:21 AM

## 2016-06-17 ENCOUNTER — Inpatient Hospital Stay (HOSPITAL_COMMUNITY): Payer: Medicaid Other

## 2016-06-17 ENCOUNTER — Inpatient Hospital Stay (HOSPITAL_COMMUNITY): Payer: Medicaid Other | Admitting: Speech Pathology

## 2016-06-17 LAB — GLUCOSE, CAPILLARY
GLUCOSE-CAPILLARY: 82 mg/dL (ref 65–99)
GLUCOSE-CAPILLARY: 227 mg/dL — AB (ref 65–99)
Glucose-Capillary: 138 mg/dL — ABNORMAL HIGH (ref 65–99)

## 2016-06-17 NOTE — Progress Notes (Signed)
[facility] PHYSICAL MEDICINE & REHABILITATION     PROGRESS NOTE    Subjective/Complaints: Somnolent. This a.m., but awakens to voice  ROS: pt denies nausea, vomiting, diarrhea, cough, shortness of breath or chest pain     Objective: Vital Signs: Blood pressure (!) 160/74, pulse 80, temperature 98.6 F (37 C), temperature source Oral, resp. rate 18, height 5\' 3"  (1.6 m), weight 94.2 kg (207 lb 10.8 oz), SpO2 94 %. Dg Swallowing Func-speech Pathology  Result Date: 06/16/2016 Jerene Pitch, CCC-SLP     06/16/2016 12:16 PM Objective Swallowing Evaluation: Type of Study: MBS-Modified Barium Swallow Study Patient Details Name: [name] MRN: [id number] Date of Birth: 20-Aug-1955 Today's Date: 06/16/2016 Time: SLP Start Time (ACUTE ONLY): 1600-SLP Stop Time (ACUTE ONLY): 1616 SLP Time Calculation (min) (ACUTE ONLY): 16 min Past Medical History: Past Medical History: Diagnosis Date . Diabetes mellitus   Hb A1C = 12.6 on 05/15/11, managed on Novolog 70/30, 35 U qam, 25 U qpm . Hypertension   poorly controlled . Shortness of breath  Past Surgical History: Past Surgical History: Procedure Laterality Date . ANKLE FRACTURE SURGERY  2007 HPI: 54-yo woman with history of DM, poorly controlled,HTN, SOB, currently uses crack cocaine admitted with tremors/contractions of the L hand and face, trouble walking with weakness of the L hand. Pt had seizure while in CT scanner. CT showed a pontine hemorrhage. Per MD notes sh has been having trouble swallowing and gets choked by both solids and liquids. CXR No active lung disease. Degenerative change in both shoulders. No Data Recorded Assessment / Plan / Recommendation CHL IP CLINICAL IMPRESSIONS 06/16/2016 Therapy Diagnosis Moderate pharyngeal phase dysphagia;Severe pharyngeal phase dysphagia;Mild oral phase dysphagia Clinical Impression Pt demonstrates adequate mastication of solids, but ongoing delayed swallow with decreased laryngeal closure, leading to  penetration before the swallow, typically with at least trace aspiration as swallow is initiated. This occurs significantly with thin liquids and in trace amounts with nectar thick liquids. Pts sensation is delayed and unreliable; she often needed verbal cues to clear throat and in one case did not sense significant aspiration. SLP attempts a variety of postures and strategies; the most effective of these was a supgraglottic swallow with small thin boluses. The pt was able to better protect airway with a breath hold as swallow was initiated. A cued throat clear was also helpful to expel any trace aspiration or penetration. She will need further training with this as she was not able to carry over independently. She also particularly struggled with dry swallow initiation to clear min pharyngeal residuals. Recommend upgrade to dys 3 mech soft with nectar thick liquids with further SLP therapy.  Impact on safety and function Moderate aspiration risk   CHL IP TREATMENT RECOMMENDATION 06/16/2016 Treatment Recommendations Therapy as outlined in treatment plan below   Prognosis 06/16/2016 Prognosis for Safe Diet Advancement Good Barriers to Reach Goals -- Barriers/Prognosis Comment -- CHL IP DIET RECOMMENDATION 06/16/2016 SLP Diet Recommendations Dysphagia 3 (Mech soft) solids;Thin liquid;Nectar thick liquid Liquid Administration via Cup Medication Administration Whole meds with puree Compensations Slow rate;Small sips/bites;Clear throat after each swallow;Other (Comment) Postural Changes Seated upright at 90 degrees   CHL IP OTHER RECOMMENDATIONS 06/16/2016 Recommended Consults -- Oral Care Recommendations Oral care BID Other Recommendations --   CHL IP FOLLOW UP RECOMMENDATIONS 06/16/2016 Follow up Recommendations Inpatient Rehab   CHL IP FREQUENCY AND DURATION 06/07/2016 Speech Therapy Frequency (ACUTE ONLY) min 2x/week Treatment Duration 2 weeks      CHL IP ORAL PHASE 06/16/2016  Oral Phase Impaired Oral - Pudding Teaspoon --  Oral - Pudding Cup -- Oral - Honey Teaspoon -- Oral - Honey Cup NT Oral - Nectar Teaspoon -- Oral - Nectar Cup Lingual/palatal residue Oral - Nectar Straw -- Oral - Thin Teaspoon -- Oral - Thin Cup Lingual/palatal residue Oral - Thin Straw -- Oral - Puree WFL Oral - Mech Soft -- Oral - Regular WFL Oral - Multi-Consistency -- Oral - Pill WFL Oral Phase - Comment --  CHL IP PHARYNGEAL PHASE 06/16/2016 Pharyngeal Phase Impaired Pharyngeal- Pudding Teaspoon -- Pharyngeal -- Pharyngeal- Pudding Cup -- Pharyngeal -- Pharyngeal- Honey Teaspoon -- Pharyngeal -- Pharyngeal- Honey Cup NT Pharyngeal -- Pharyngeal- Nectar Teaspoon -- Pharyngeal -- Pharyngeal- Nectar Cup Delayed swallow initiation-pyriform sinuses;Penetration/Aspiration before swallow;Penetration/Aspiration during swallow;Pharyngeal residue - valleculae;Pharyngeal residue - pyriform;Trace aspiration Pharyngeal Material enters airway, CONTACTS cords and then ejected out;Material enters airway, CONTACTS cords and not ejected out;Material enters airway, passes BELOW cords and not ejected out despite cough attempt by patient;Material enters airway, passes BELOW cords then ejected out Pharyngeal- Nectar Straw -- Pharyngeal -- Pharyngeal- Thin Teaspoon -- Pharyngeal -- Pharyngeal- Thin Cup Delayed swallow initiation-pyriform sinuses;Penetration/Aspiration before swallow;Penetration/Aspiration during swallow;Pharyngeal residue - valleculae;Pharyngeal residue - pyriform;Moderate aspiration;Trace aspiration;Penetration/Apiration after swallow;Compensatory strategies attempted (with notebox) Pharyngeal Material enters airway, passes BELOW cords without attempt by patient to eject out (silent aspiration);Material enters airway, passes BELOW cords and not ejected out despite cough attempt by patient;Material enters airway, passes BELOW cords then ejected out;Material enters airway, CONTACTS cords and not ejected out;Material enters airway, CONTACTS cords and then ejected  out;Material does not enter airway Pharyngeal- Thin Straw -- Pharyngeal -- Pharyngeal- Puree Delayed swallow initiation-pyriform sinuses;WFL Pharyngeal -- Pharyngeal- Mechanical Soft -- Pharyngeal -- Pharyngeal- Regular Delayed swallow initiation-vallecula;WFL Pharyngeal -- Pharyngeal- Multi-consistency -- Pharyngeal -- Pharyngeal- Pill Delayed swallow initiation-vallecula;WFL Pharyngeal -- Pharyngeal Comment --  CHL IP CERVICAL ESOPHAGEAL PHASE 06/07/2016 Cervical Esophageal Phase WFL Pudding Teaspoon -- Pudding Cup -- Honey Teaspoon -- Honey Cup -- Nectar Teaspoon -- Nectar Cup -- Nectar Straw -- Thin Teaspoon -- Thin Cup -- Thin Straw -- Puree -- Mechanical Soft -- Regular -- Multi-consistency -- Pill -- Cervical Esophageal Comment -- No flowsheet data found. DeBlois, Katherene Ponto 06/16/2016, 12:12 PM              No results for input(s): WBC, HGB, HCT, PLT in the last 72 hours. No results for input(s): NA, K, CL, GLUCOSE, BUN, CREATININE, CALCIUM in the last 72 hours.  Invalid input(s): CO CBG (last 3)   Recent Labs  06/16/16 1700 06/16/16 1732 06/16/16 2112  GLUCAP 44* 93 309*    Wt Readings from Last 3 Encounters:  06/14/16 94.2 kg (207 lb 10.8 oz)  06/07/16 93.4 kg (205 lb 14.6 oz)  01/14/16 101.2 kg (223 lb)    Physical Exam:  Constitutional: She appears well-developed.  Eyes: PERRL.    Neck: suppple.  Cardiovascular: RRR Respiratory: Equal breath sounds anteriorly, no wheezes GI: Soft.   Neurological: She is alert.  Alert. Speech remains dysarthric. Reasonable insight and awareness. Follows all commands. . Continued CN 7. Marland KitchenRUE remains 4-/5 bicep, tricep, wrist, hand. RLE 4 to 4+/5 prox to distal. LUE 5/5. LLE 5/5. No sensory findings  Skin: intact.  Psychiatric:Somnolent  Assessment/Plan: 1. 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 Physiatrist is providing close team supervision and 24 hour management of active medical problems  listed below. Physiatrist and rehab team continue to assess barriers to discharge/monitor patient progress toward functional and medical goals.  Function:  Bathing Bathing position   Position: Shower  Bathing parts Body parts bathed by patient: Right arm, Left arm, Chest, Abdomen, Front perineal area, Buttocks, Right upper leg, Left upper leg, Right lower leg, Left lower leg Body parts bathed by helper: Back  Bathing assist Assist Level: Supervision or verbal cues      Upper Body Dressing/Undressing Upper body dressing   What is the patient wearing?: Pull over shirt/dress     Pull over shirt/dress - Perfomed by patient: Thread/unthread right sleeve, Thread/unthread left sleeve, Put head through opening, Pull shirt over trunk          Upper body assist Assist Level: Supervision or verbal cues      Lower Body Dressing/Undressing Lower body dressing   What is the patient wearing?: Underwear, Pants, Non-skid slipper socks Underwear - Performed by patient: Thread/unthread right underwear leg, Thread/unthread left underwear leg, Pull underwear up/down   Pants- Performed by patient: Thread/unthread right pants leg, Thread/unthread left pants leg, Pull pants up/down Pants- Performed by helper: Pull pants up/down Non-skid slipper socks- Performed by patient: Don/doff right sock, Don/doff left sock Non-skid slipper socks- Performed by helper: Don/doff right sock, Don/doff left sock       Shoes - Performed by helper: Don/doff right shoe, Don/doff left shoe          Lower body assist Assist for lower body dressing: Supervision or verbal cues      Toileting Toileting   Toileting steps completed by patient: Adjust clothing prior to toileting, Performs perineal hygiene, Adjust clothing after toileting Toileting steps completed by helper: Adjust clothing prior to toileting,  Performs perineal hygiene, Adjust clothing after toileting Toileting Assistive Devices: Grab bar or rail  Toileting assist Assist level: Touching or steadying assistance (Pt.75%)   Transfers Chair/bed transfer   Chair/bed transfer method: Ambulatory Chair/bed transfer assist level: Touching or steadying assistance (Pt > 75%) Chair/bed transfer assistive device: Armrests, Medical sales representative     Max distance: 150 ft Assist level: Supervision or verbal cues   Wheelchair   Type: Manual Max wheelchair distance: 176ft  Assist Level: Supervision or verbal cues  Cognition Comprehension Comprehension assist level: Follows complex conversation/direction with extra time/assistive device  Expression Expression assist level: Expresses complex 90% of the time/cues < 10% of the time  Social Interaction Social Interaction assist level: Interacts appropriately with others - No medications needed.  Problem Solving Problem solving assist level: Solves complex 90% of the time/cues < 10% of the time  Memory Memory assist level: Recognizes or recalls 90% of the time/requires cueing < 10% of the time   Medical Problem List and Plan:  1. 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 DVT Prophylaxis/Anticoagulation: SCD's 3. Pain Management/chronic back pain: Ultram 50 mg daily as needed as well as Flexeril. Patient had been on Neurontin 600 mg 4 times a day, hydrocodone as needed prior to admission.  4. Dysphagia. Dysphagia #2 nectar thick liquids.   --encourage PO  -continue water protocol 5. Neuropsych: This patient is capable of making decisions on her own behalf.  6. Skin/Wound Care: Routine skin checks  7. Fluids/Electrolytes/Nutrition: encourage PO fluids. Has reasonable appetite   -recheck labs monday 8. Diabetes mellitus with peripheral neuropathy. Hemoglobin A1c  11.4.  -have adjusted  70/30  to 50u am and 30u pm, will reduce a.m. dose secondary to hypoglycemia at 1700  -sugars much improved yesterday---continue current regimen CBG (last 3)   Recent Labs  06/16/16 1700 06/16/16 1732 06/16/16 2112  GLUCAP 44* 93 309*      9. Seizure prophylaxis. Keppra 1000 mg every 12 hours. EEG negative  10. Hypertension. Lisinopril 20 mg daily, HCTZ 12.5 mg daily.   -improved control overall-  -stopped hctz due to nectar fluids/CRI  -increased metoprolol to 25mg  bid , no evidence of bradycardia Vitals:   06/16/16 2111 06/17/16 0421  BP: (!) 180/86 (!) 160/74  Pulse: 100 80  Resp:  18  Temp:  98.6 F (37 C)   11. Hyperlipidemia. Patient on Zocor 10 mg daily at bedtime prior to admission.  12. Tobacco/cocaine abuse. Nicoderm patch. Provide counseling  13. Hypokalemia. Potassium supplement   14. Question acute on chronic renal insufficiency. Baseline creatinine 1.74 on admission his creatinine 1.38. Cr stable to improved 15. Hx---COPD  -improved with mucinex  -continue IS,FV   LOS (Days) 8 A FACE TO FACE EVALUATION WAS PERFORMED  Alysia Penna E 06/17/2016 10:44 AM

## 2016-06-17 NOTE — Progress Notes (Addendum)
Speech Language Pathology Daily Session Note  Patient Details  Name: [name] MRN: [id number] Date of Birth: 1956-03-16  Today's Date: 06/17/2016 SLP Individual Time: 1345-1430 SLP Individual Time Calculation (min): 45 min   Short Term Goals: Week 2: SLP Short Term Goal 1 (Week 2): Patient will consume dysphagia 3 textures with nectar-thick liquids with minimal overt s/s of aspiration diet with no overt s/s of aspiration with supervision verbal cues for use of swallowing compensatory strategies SLP Short Term Goal 2 (Week 2): Patient will utilize supraglottic swallow with trials of thin liquids with 90% accuracy and minimal overt s/s of aspiration over 3 sessions prior to upgrade.   Skilled Therapeutic Interventions: Skilled treatment session focused on cognition and dysphagia goals. SLP facilitated session by providing Max A verbal cues for recall of current diet (i.e., nectar thick liquids). Pt able independently recall that swallow was "not right" but not able to comprehend need for nectar thick liquids. SLP provided Min A verbal cues for use of compensatory swallow strategies when consuming dysphagia 3 with nectar thick. Pt consumed trials of ice chips with immediate cough present. Pt not able to demonstrate understand of rationale behind strategies or thickened liquids. Pt left in bed with bed alarm on and all needs within reach. Continue current plan of care.   Function:  Eating Eating   Modified Consistency Diet: No Eating Assist Level: Supervision or verbal cues           Cognition Comprehension Comprehension assist level: Understands basic 75 - 89% of the time/ requires cueing 10 - 24% of the time  Expression   Expression assist level: Expresses basic 75 - 89% of the time/requires cueing 10 - 24% of the time. Needs helper to occlude trach/needs to repeat words.  Social Interaction Social Interaction assist level: Interacts appropriately with others - No medications  needed.  Problem Solving Problem solving assist level: Solves basic 75 - 89% of the time/requires cueing 10 - 24% of the time  Memory Memory assist level: Recognizes or recalls 75 - 89% of the time/requires cueing 10 - 24% of the time    Pain    Therapy/Group: Individual Therapy  [name] 06/17/2016, 2:13 PM

## 2016-06-17 NOTE — Progress Notes (Signed)
Occupational Therapy Session Note  Patient Details  Name: Teniyah Lomax MRN: [id number] Date of Birth: [birth year]  Today's Date: 06/17/2016 OT Individual Time: 1045-1130 OT Individual Time Calculation (min): 45 min     Short Term Goals: Week 2:  OT Short Term Goal 1 (Week 2): STG=LTG secondary to ELOS  Skilled Therapeutic Interventions/Progress Updates:    Pt engaged in BADL retraining including bathing at shower level and dressing with sit<>stand from chair.  Pt amb with RW to bathroom to complete all bathing/dressing tasks.  Pt completed all tasks at supervision level. Pt remained in w/c at sink with QRB in place. Continued discharge planning.  Focus on BADL retraining, standing balance, functional amb with RW, discharge planning, and safety awareness to increase independence with BADLs.  Therapy Documentation Precautions:  Precautions Precautions: Fall Precaution Comments:  (Monitor BP) Restrictions Weight Bearing Restrictions: No Pain: Pain Assessment Pain Assessment: No/denies pain Pain Score: 0-No pain  See Function Navigator for Current Functional Status.   Therapy/Group: Individual Therapy  Leroy Libman 06/17/2016, 11:53 AM

## 2016-06-18 ENCOUNTER — Inpatient Hospital Stay (HOSPITAL_COMMUNITY): Payer: Medicaid Other | Admitting: Occupational Therapy

## 2016-06-18 LAB — GLUCOSE, CAPILLARY
GLUCOSE-CAPILLARY: 232 mg/dL — AB (ref 65–99)
Glucose-Capillary: 206 mg/dL — ABNORMAL HIGH (ref 65–99)
Glucose-Capillary: 162 mg/dL — ABNORMAL HIGH (ref 65–99)
Glucose-Capillary: 141 mg/dL — ABNORMAL HIGH (ref 65–99)

## 2016-06-18 MED ORDER — INSULIN ASPART PROT & ASPART (70-30 MIX) 100 UNIT/ML ~~LOC~~ SUSP
33.0000 [IU] | Freq: Every day | SUBCUTANEOUS | Status: DC
  Administered 2016-06-18 – 2016-06-19 (×2): 33 [IU] via SUBCUTANEOUS

## 2016-06-18 MED ORDER — INSULIN ASPART PROT & ASPART (70-30 MIX) 100 UNIT/ML ~~LOC~~ SUSP
53.0000 [IU] | Freq: Every day | SUBCUTANEOUS | Status: DC
  Administered 2016-06-18 – 2016-06-19 (×2): 53 [IU] via SUBCUTANEOUS

## 2016-06-18 NOTE — Progress Notes (Signed)
[place] [facility]ICINE & REH[facility]ILITATION     PROGRESS NOTE    Subjective/Complaints: Somnolent. Awakens and follows commands Supervision ADLs yesterday with OT Diet upgraded per SLP  ROS: pt denies nausea, vomiting, diarrhea, cough, shortness of breath or chest pain     Objective: Vital Signs: Blood pressure 111/73, pulse 87, temperature 98.7 F (37.1 C), temperature source Oral, resp. rate 16, height 5' 8.5" (1.74 m), weight 94.2 kg (207 lb 10.8 oz), SpO2 91 %. Dg Swallowing Func-speech Pathology  Result Date: 06/16/2016 Jerene Pitch, CCC-SLP     06/16/2016 12:16 PM Objective Swallowing Evaluation: Type of Study: MBS-Modified Barium Swallow Study Patient Details Name: [name] MRN: [id number] Date of Birth: 06-Oct-1955 Today's Date: 06/16/2016 Time: SLP Start Time (ACUTE ONLY): 1600-SLP Stop Time (ACUTE ONLY): 1616 SLP Time Calculation (min) (ACUTE ONLY): 16 min Past Medical History: Past Medical History: Diagnosis Date . Diabetes mellitus   Hb A1C = 12.6 on 05/15/11, managed on Novolog 70/30, 35 U qam, 25 U qpm . Hypertension   poorly controlled . Shortness of breath  Past Surgical History: Past Surgical History: Procedure Laterality Date . ANKLE FRACTURE SURGERY  2007 HPI: 21-yo woman with history of DM, poorly controlled,HTN, SOB, currently uses crack cocaine admitted with tremors/contractions of the L hand and face, trouble walking with weakness of the L hand. Pt had seizure while in CT scanner. CT showed a pontine hemorrhage. Per MD notes sh has been having trouble swallowing and gets choked by both solids and liquids. CXR No active lung disease. Degenerative change in both shoulders. No Data Recorded Assessment / Plan / Recommendation CHL IP CLINICAL IMPRESSIONS 06/16/2016 Therapy Diagnosis Moderate pharyngeal phase dysphagia;Severe pharyngeal phase dysphagia;Mild oral phase dysphagia Clinical Impression Pt demonstrates adequate mastication of solids, but ongoing delayed  swallow with decreased laryngeal closure, leading to penetration before the swallow, typically with at least trace aspiration as swallow is initiated. This occurs significantly with thin liquids and in trace amounts with nectar thick liquids. Pts sensation is delayed and unreliable; she often needed verbal cues to clear throat and in one case did not sense significant aspiration. SLP attempts a variety of postures and strategies; the most effective of these was a supgraglottic swallow with small thin boluses. The pt was able to better protect airway with a breath hold as swallow was initiated. A cued throat clear was also helpful to expel any trace aspiration or penetration. She will need further training with this as she was not able to carry over independently. She also particularly struggled with dry swallow initiation to clear min pharyngeal residuals. Recommend upgrade to dys 3 mech soft with nectar thick liquids with further SLP therapy.  Impact on safety and function Moderate aspiration risk   CHL IP TREATMENT RECOMMENDATION 06/16/2016 Treatment Recommendations Therapy as outlined in treatment plan below   Prognosis 06/16/2016 Prognosis for Safe Diet Advancement Good Barriers to Reach Goals -- Barriers/Prognosis Comment -- CHL IP DIET RECOMMENDATION 06/16/2016 SLP Diet Recommendations Dysphagia 3 (Mech soft) solids;Thin liquid;Nectar thick liquid Liquid Administration via Cup Medication Administration Whole meds with puree Compensations Slow rate;Small sips/bites;Clear throat after each swallow;Other (Comment) Postural Changes Seated upright at 90 degrees   CHL IP OTHER RECOMMENDATIONS 06/16/2016 Recommended Consults -- Oral Care Recommendations Oral care BID Other Recommendations --   CHL IP FOLLOW UP RECOMMENDATIONS 06/16/2016 Follow up Recommendations Inpatient Rehab   CHL IP FREQUENCY AND DURATION 06/07/2016 Speech Therapy Frequency (ACUTE ONLY) min 2x/week Treatment Duration 2 weeks  CHL IP ORAL PHASE  06/16/2016 Oral Phase Impaired Oral - Pudding Teaspoon -- Oral - Pudding Cup -- Oral - Honey Teaspoon -- Oral - Honey Cup NT Oral - Nectar Teaspoon -- Oral - Nectar Cup Lingual/palatal residue Oral - Nectar Straw -- Oral - Thin Teaspoon -- Oral - Thin Cup Lingual/palatal residue Oral - Thin Straw -- Oral - Puree WFL Oral - Mech Soft -- Oral - Regular WFL Oral - Multi-Consistency -- Oral - Pill WFL Oral Phase - Comment --  CHL IP PHARYNGEAL PHASE 06/16/2016 Pharyngeal Phase Impaired Pharyngeal- Pudding Teaspoon -- Pharyngeal -- Pharyngeal- Pudding Cup -- Pharyngeal -- Pharyngeal- Honey Teaspoon -- Pharyngeal -- Pharyngeal- Honey Cup NT Pharyngeal -- Pharyngeal- Nectar Teaspoon -- Pharyngeal -- Pharyngeal- Nectar Cup Delayed swallow initiation-pyriform sinuses;Penetration/Aspiration before swallow;Penetration/Aspiration during swallow;Pharyngeal residue - valleculae;Pharyngeal residue - pyriform;Trace aspiration Pharyngeal Material enters airway, CONTACTS cords and then ejected out;Material enters airway, CONTACTS cords and not ejected out;Material enters airway, passes BELOW cords and not ejected out despite cough attempt by patient;Material enters airway, passes BELOW cords then ejected out Pharyngeal- Nectar Straw -- Pharyngeal -- Pharyngeal- Thin Teaspoon -- Pharyngeal -- Pharyngeal- Thin Cup Delayed swallow initiation-pyriform sinuses;Penetration/Aspiration before swallow;Penetration/Aspiration during swallow;Pharyngeal residue - valleculae;Pharyngeal residue - pyriform;Moderate aspiration;Trace aspiration;Penetration/Apiration after swallow;Compensatory strategies attempted (with notebox) Pharyngeal Material enters airway, passes BELOW cords without attempt by patient to eject out (silent aspiration);Material enters airway, passes BELOW cords and not ejected out despite cough attempt by patient;Material enters airway, passes BELOW cords then ejected out;Material enters airway, CONTACTS cords and not ejected  out;Material enters airway, CONTACTS cords and then ejected out;Material does not enter airway Pharyngeal- Thin Straw -- Pharyngeal -- Pharyngeal- Puree Delayed swallow initiation-pyriform sinuses;WFL Pharyngeal -- Pharyngeal- Mechanical Soft -- Pharyngeal -- Pharyngeal- Regular Delayed swallow initiation-vallecula;WFL Pharyngeal -- Pharyngeal- Multi-consistency -- Pharyngeal -- Pharyngeal- Pill Delayed swallow initiation-vallecula;WFL Pharyngeal -- Pharyngeal Comment --  CHL IP CERVICAL ESOPHAGEAL PHASE 06/07/2016 Cervical Esophageal Phase WFL Pudding Teaspoon -- Pudding Cup -- Honey Teaspoon -- Honey Cup -- Nectar Teaspoon -- Nectar Cup -- Nectar Straw -- Thin Teaspoon -- Thin Cup -- Thin Straw -- Puree -- Mechanical Soft -- Regular -- Multi-consistency -- Pill -- Cervical Esophageal Comment -- No flowsheet data found. DeBlois, Katherene Ponto 06/16/2016, 12:12 PM              No results for input(s): WBC, HGB, HCT, PLT in the last 72 hours. No results for input(s): NA, K, CL, GLUCOSE, BUN, CREATININE, CALCIUM in the last 72 hours.  Invalid input(s): CO CBG (last 3)   Recent Labs  06/17/16 1637 06/17/16 2036 06/18/16 0629  GLUCAP 227* 138* 206*    Wt Readings from Last 3 Encounters:  06/14/16 94.2 kg (207 lb 10.8 oz)  06/07/16 93.4 kg (205 lb 14.6 oz)  01/14/16 101.2 kg (223 lb)    Physical Exam:  Constitutional: She appears well-developed.  Eyes: PERRL.    Neck: suppple.  Cardiovascular: RRR Respiratory: Equal breath sounds anteriorly, no wheezes GI: Soft.   Neurological: She is alert.  Alert. Speech remains dysarthric. Reasonable insight and awareness. Follows all commands. . Continued CN 7. Marland KitchenRUE remains 4-/5 bicep, tricep, wrist, hand. RLE 4 to 4+/5 prox to distal. LUE 5/5. LLE 5/5. No sensory findings  Skin: intact.  Psychiatric:Somnolent  Assessment/Plan: 1. 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 Physiatrist is providing close team supervision and 24 hour  management of active medical problems listed below. Physiatrist and rehab team continue to assess barriers to discharge/monitor patient progress toward functional and medical goals.  Function:  Bathing Bathing position   Position: Shower  Bathing parts Body parts bathed by patient: Right arm, Left arm, Chest, Abdomen, Front perineal area, Buttocks, Right upper leg, Left upper leg, Right lower leg, Left lower leg, Back Body parts bathed by helper: Back  Bathing assist Assist Level: Supervision or verbal cues      Upper Body Dressing/Undressing Upper body dressing   What is the patient wearing?: Pull over shirt/dress     Pull over shirt/dress - Perfomed by patient: Thread/unthread right sleeve, Thread/unthread left sleeve, Put head through opening, Pull shirt over trunk          Upper body assist Assist Level: No help, No cues      Lower Body Dressing/Undressing Lower body dressing   What is the patient wearing?: Underwear, Pants, Non-skid slipper socks Underwear - Performed by patient: Thread/unthread right underwear leg, Thread/unthread left underwear leg, Pull underwear up/down   Pants- Performed by patient: Thread/unthread right pants leg, Thread/unthread left pants leg, Pull pants up/down Pants- Performed by helper: Pull pants up/down Non-skid slipper socks- Performed by patient: Don/doff right sock, Don/doff left sock Non-skid slipper socks- Performed by helper: Don/doff right sock, Don/doff left sock       Shoes - Performed by helper: Don/doff right shoe, Don/doff left shoe          Lower body assist Assist for lower body dressing: Supervision or verbal cues      Toileting Toileting   Toileting steps completed by patient: Adjust clothing prior to toileting, Performs perineal hygiene, Adjust clothing after toileting Toileting steps  completed by helper: Adjust clothing prior to toileting, Performs perineal hygiene, Adjust clothing after toileting Toileting Assistive Devices: Grab bar or rail  Toileting assist Assist level: Touching or steadying assistance (Pt.75%)   Transfers Chair/bed transfer   Chair/bed transfer method: Ambulatory Chair/bed transfer assist level: Touching or steadying assistance (Pt > 75%) Chair/bed transfer assistive device: Armrests, Medical sales representative     Max distance: 150 ft Assist level: Supervision or verbal cues   Wheelchair   Type: Manual Max wheelchair distance: 129ft  Assist Level: Supervision or verbal cues  Cognition Comprehension Comprehension assist level: Follows basic conversation/direction with no assist  Expression Expression assist level: Expresses basic needs/ideas: With no assist  Social Interaction Social Interaction assist level: Interacts appropriately with others - No medications needed.  Problem Solving Problem solving assist level: Solves basic 75 - 89% of the time/requires cueing 10 - 24% of the time  Memory Memory assist level: Recognizes or recalls 90% of the time/requires cueing < 10% of the time   Medical Problem List and Plan:  1. 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 DVT Prophylaxis/Anticoagulation: SCD's 3. Pain Management/chronic back pain: Ultram 50 mg daily as needed as well as Flexeril. Patient had been on Neurontin 600 mg 4 times a day, hydrocodone as needed prior to admission.  4. Dysphagia. Dysphagia #2 nectar thick liquids.   --encourage PO  -continue water protocol 5. Neuropsych: This patient is capable of making decisions on her own behalf.  6. Skin/Wound Care: Routine skin checks  7. Fluids/Electrolytes/Nutrition: encourage PO fluids. -recheck labs Monday appetite/ Intake varies, complicates diabetic management  8.  Diabetes mellitus with peripheral neuropathy. Hemoglobin A1c  11.4.  -have adjusted  70/30 to 50u am and 30u pm, 4p and am CBG elevated Will adjust dose  (last 3)   Recent Labs  06/17/16 1637 06/17/16 2036 06/18/16 0629  GLUCAP 227* 138* 206*      9. Seizure prophylaxis. Keppra 1000 mg every 12 hours. EEG negative  10. Hypertension. Lisinopril 20 mg daily, HCTZ 12.5 mg daily.   -improved control overall-  -stopped hctz due to nectar fluids/CRI  -increased metoprolol to 25mg  bid , no evidence of bradycardia Vitals:   06/17/16 1513 06/18/16 0452  BP: (!) 157/80 111/73  Pulse: 88 87  Resp: 19 16  Temp: 99.7 F (37.6 C) 98.7 F (37.1 C)   11. Hyperlipidemia. Patient on Zocor 10 mg daily at bedtime prior to admission.  12. Tobacco/cocaine abuse. Nicoderm patch. Provide counseling  13. Hypokalemia. Potassium supplement   14. Question acute on chronic renal insufficiency. Baseline creatinine 1.74 on admission his creatinine 1.38. Cr stable to improved 15. Hx---COPD  -improved with mucinex  -continue IS,FV   LOS (Days) 9 A FACE TO FACE EVALUATION WAS PERFORMED  [name] 06/18/2016 6:48 AM

## 2016-06-18 NOTE — Progress Notes (Signed)
Occupational Therapy Session Note  Patient Details  Name: [name] MRN: [id number] Date of Birth: 04/28/1956  Today's Date: 06/18/2016 OT Individual Time: 1429-1500 OT Individual Time Calculation (min): 31 min    Skilled Therapeutic Interventions/Progress Updates:   Pt was lying in bed at time of arrival, requesting shower due to soiled brief. Pt transferred with RW to shower with supervision. Bathing completed with overall supervision standing as needed with grab bars for pericare. Safety cuing during transfers in and out of shower. Afterwards pt completed dressing at EOB with supervision for safe hand placement during sit<stand and proper placement of RW. Pt was then transferred to recliner with LEs elevated, repositioned for comfort, and all needs within reach. Safety belt also donned at time of departure.   Therapy Documentation Precautions:  Precautions Precautions: Fall Precaution Comments:  (Monitor BP) Restrictions Weight Bearing Restrictions: No General:   Vital Signs: Therapy Vitals Temp: 98.9 F (37.2 C) Temp Source: Oral Pulse Rate: 87 Resp: 17 BP: (!) 115/55 Patient Position (if appropriate): Lying Oxygen Therapy SpO2: 95 % O2 Device: Not Delivered Pain: No c/o pain during session    ADL: ADL ADL Comments: Please see functional navigator for ADL status    See Function Navigator for Current Functional Status.   Therapy/Group: Individual Therapy  Jatavia Keltner A Caylan Schifano 06/18/2016, 4:22 PM

## 2016-06-19 ENCOUNTER — Inpatient Hospital Stay (HOSPITAL_COMMUNITY): Payer: Medicaid Other | Admitting: Speech Pathology

## 2016-06-19 ENCOUNTER — Inpatient Hospital Stay (HOSPITAL_COMMUNITY): Payer: Medicaid Other

## 2016-06-19 ENCOUNTER — Inpatient Hospital Stay (HOSPITAL_COMMUNITY): Payer: Medicaid Other | Admitting: Physical Therapy

## 2016-06-19 DIAGNOSIS — N189 Chronic kidney disease, unspecified: Secondary | ICD-10-CM

## 2016-06-19 LAB — GLUCOSE, CAPILLARY
GLUCOSE-CAPILLARY: 182 mg/dL — AB (ref 65–99)
GLUCOSE-CAPILLARY: 160 mg/dL — AB (ref 65–99)
Glucose-Capillary: 271 mg/dL — ABNORMAL HIGH (ref 65–99)
Glucose-Capillary: 119 mg/dL — ABNORMAL HIGH (ref 65–99)
Glucose-Capillary: 185 mg/dL — ABNORMAL HIGH (ref 65–99)

## 2016-06-19 LAB — BASIC METABOLIC PANEL
Anion gap: 8 (ref 5–15)
BUN: 10 mg/dL (ref 6–20)
CALCIUM: 8.8 mg/dL — AB (ref 8.9–10.3)
CO2: 27 mmol/L (ref 22–32)
Chloride: 103 mmol/L (ref 101–111)
Creatinine, Ser: 1.8 mg/dL — ABNORMAL HIGH (ref 0.44–1.00)
GFR calc Af Amer: 34 mL/min — ABNORMAL LOW (ref >60)
GFR, EST NON AFRICAN AMERICAN: 30 mL/min — AB (ref >60)
GLUCOSE: 126 mg/dL — AB (ref 65–99)
Potassium: 5.1 mmol/L (ref 3.5–5.1)
Sodium: 138 mmol/L (ref 135–145)

## 2016-06-19 MED ORDER — PANTOPRAZOLE SODIUM 40 MG PO TBEC
40.0000 mg | DELAYED_RELEASE_TABLET | Freq: Every day | ORAL | Status: DC
  Administered 2016-06-20: 40 mg via ORAL
  Filled 2016-06-19: qty 1

## 2016-06-19 MED ORDER — RESOURCE THICKENUP CLEAR PO POWD
ORAL | Status: DC | PRN
  Filled 2016-06-19: qty 125

## 2016-06-19 MED ORDER — SALINE SPRAY 0.65 % NA SOLN
1.0000 | NASAL | Status: DC | PRN
  Administered 2016-06-19 (×2): 1 via NASAL
  Filled 2016-06-19: qty 44

## 2016-06-19 NOTE — Progress Notes (Signed)
Speech Language Pathology Discharge Summary  Patient Details  Name: [name] MRN: [id number] Date of Birth: 13-Feb-1956  Today's Date: 06/19/2016 SLP Individual Time: 1430-1510 SLP Individual Time Calculation (min): 40 min  Skilled Therapeutic Interventions:  Skilled treatment session focused on completion of patient education. Patient was educated on current swallowing function, diet recommendations, appropriate textures, swallowing compensatory strategies and how to appropriately thicken liquids. Patient verbalized and demonstrated understanding of all information and handouts were also given to reinforce information. Patient requested to use the bathroom and required supervision verbal cues for safety with ambulation. Patient left supine in bed with all needs within reach.   Patient has met 2 of 5 long term goals.  Patient to discharge at overall Supervision level.   Reasons goals not met: Patient continues to require nectar-thick liquids due to silent aspiration of thin liquids with intermittent supervision verbal cues needed for use of safe swallowing strategies.    Clinical Impression/Discharge Summary: Patient has made functional and inconsistent gains and has met 2 of 5 LTG's this reporting period. Currently, patient is Mod I for recall of functional and daily information and to perform functional asks safely. Patient is currently consuming Dys. 3 textures with nectar-thick liquids with intermittent overt s/s of aspiration and requires supervision verbal cues for use of swallowing compensatory strategies. Patient education is complete and patient will discharge home with intermittent assistance from family. Patient would benefit from f/u SLP services to maximize her swallowing function and overall functional independence.   Care Partner:  Caregiver Able to Provide Assistance: Yes     Recommendation:  Home Health SLP  Rationale for SLP Follow Up: Maximize swallowing safety    Equipment: Thickener    Reasons for discharge: Discharged from hospital   Patient/Family Agrees with Progress Made and Goals Achieved: Yes   Function:  Eating Eating   Modified Consistency Diet: Yes Eating Assist Level: Supervision or verbal cues           Cognition Comprehension Comprehension assist level: Follows basic conversation/direction with no assist  Expression   Expression assist level: Expresses complex 90% of the time/cues < 10% of the time  Social Interaction Social Interaction assist level: Interacts appropriately 90% of the time - Needs monitoring or encouragement for participation or interaction.  Problem Solving Problem solving assist level: Solves complex 90% of the time/cues < 10% of the time  Memory Memory assist level: Recognizes or recalls 90% of the time/requires cueing < 10% of the time   Arsal Tappan 06/19/2016, 4:19 PM

## 2016-06-19 NOTE — Plan of Care (Signed)
Problem: RH Balance Goal: LTG Patient will maintain dynamic sitting balance (PT) LTG:  Patient will maintain dynamic sitting balance with assistance during mobility activities (PT)  Outcome: Completed/Met Date Met: 06/19/16 With RW Goal: LTG Patient will maintain dynamic standing balance (PT) LTG:  Patient will maintain dynamic standing balance with assistance during mobility activities (PT)  Outcome: Completed/Met Date Met: 06/19/16 With RW  Problem: RH Bed to Chair Transfers Goal: LTG Patient will perform bed/chair transfers w/assist (PT) LTG: Patient will perform bed/chair transfers with assistance, with/without cues (PT).  Outcome: Completed/Met Date Met: 06/19/16 With RW  Problem: RH Car Transfers Goal: LTG Patient will perform car transfers with assist (PT) LTG: Patient will perform car transfers with assistance (PT).  Outcome: Completed/Met Date Met: 06/19/16 With RW  Problem: RH Ambulation Goal: LTG Patient will ambulate in controlled environment (PT) LTG: Patient will ambulate in a controlled environment, # of feet with assistance (PT).  Outcome: Completed/Met Date Met: 06/19/16 200 ft with RW Goal: LTG Patient will ambulate in home environment (PT) LTG: Patient will ambulate in home environment, # of feet with assistance (PT).  Outcome: Completed/Met Date Met: 06/19/16 50 ft with RW Goal: LTG Patient will ambulate in community environment (PT) LTG: Patient will ambulate in community environment, # of feet with assistance (PT).  Outcome: Completed/Met Date Met: 06/19/16 150 ft with RW  Problem: RH Stairs Goal: LTG Patient will ambulate up and down stairs w/assist (PT) LTG: Patient will ambulate up and down # of stairs with assistance (PT)  Outcome: Completed/Met Date Met: 06/19/16 12 steps (6"+3") with B rails

## 2016-06-19 NOTE — Progress Notes (Signed)
Occupational Therapy Note  Patient Details  Name: [name] MRN: [id number] Date of Birth: [birth year]  Today's Date: 06/19/2016 OT Individual Time: 1300-1330 OT Individual Time Calculation (min): 30 min    Pt denied pain Individual therapy  Pt resting in bed upon arrival and agreeable to therapy.  Pt engaged in functional amb with RW in community environment (gift shop and [facility]).  Focus on activity tolerance and safety awareness.  Continued discharge planning.  Pt pleased with progress and ready for discharge.   Leotis Shames [facility] 06/19/2016, 2:40 PM

## 2016-06-19 NOTE — Discharge Summary (Signed)
Discharge summary job # [id number]

## 2016-06-19 NOTE — Progress Notes (Signed)
Physical Therapy Discharge Summary  Patient Details  Name: [name] MRN: [id number] Date of Birth: 06-09-1956  Today's Date: 06/19/2016 PT Individual Time: 9381-0175 PT Individual Time Calculation (min): 71 min     Patient has met 9 of 9 long term goals due to improved activity tolerance, improved balance, improved postural control, increased strength, functional use of  right upper extremity and right lower extremity, improved attention, improved awareness and improved coordination.  Patient to discharge at an ambulatory level Modified Independent.   No family was present for caregiver education. Pt is to d/c at Mod I level with RW.  Reasons goals not met: n/a  Recommendation:  Patient will benefit from ongoing skilled PT services in home health setting to continue to advance safe functional mobility, address ongoing impairments in dynamic balance without BUE support, endurance, functional use of RUE/LE, safety awareness, ambulation without AD, and minimize fall risk.  Equipment: RW  Reasons for discharge: treatment goals met  Patient/family agrees with progress made and goals achieved: Yes  Skilled PT treatment: Patient received in bed & agreeable to tx, noting 5/10 pain & that "my insides feel like mush" and RN made aware. Session focused on gait within controlled environment, home environment & over uneven surfaces with RW. Pt able to negotiate even surfaces with Mod I but requires supervision for safety over uneven surfaces. Pt negotiated single curb with RW & supervision, with cuing for technique & safety. Pt negotiated 12 steps (6" + 3") with B rails & mod I with self selected gait pattern. Pt completed car, couch & sit<>stand transfers from various surfaces with Mod I & bed mobility in apartment with mod I. Instructed pt on risks of falling and need to use RW with pt first stating she does not plan to use AD, but later agreeable to using it until cleared by HHPT to use next  LRAD. Discussed pt's fall risk & pt performed floor transfer with supervision by pushing up onto couch to return to sitting position. Pt experiencing nose bleed after floor transfer (BP = 144/85 mmHg) & RN made aware. Pt became emotional during session regarding current situation and physical limitations. Therapist provided emotional support and encouragement, as well as education regarding progression with HHPT. Pt completed Berg Balance Test & scored [phone]; therapist educated pt on interpretation of score. Patient demonstrates increased fall risk as noted by score of 44/56 on Berg Balance Scale.  (<36= high risk for falls, close to 100%; 37-45 significant >80%; 46-51 moderate >50%; 52-55 lower >25%). At end of session pt left in bed with all needs within reach & bed alarm set.    PT Discharge Precautions/RestrictionsPrecautions Precautions: None Restrictions Weight Bearing Restrictions: No  Vital Signs Therapy Vitals Pulse Rate: 96 BP: (!) 144/85 Patient Position (if appropriate): Sitting  Pain Pain Assessment Pain Assessment: 0-10 Pain Score: 5  Pain Location: Abdomen ("my insides feel like mush") Pain Orientation: Right Pain Intervention(s): RN made aware  Vision/Perception  Pt reports blurry vision in R eye, feels it may be getting worse.  Does not wear glasses at baseline.  Cognition Arousal/Alertness: Awake/alert Orientation Level: Oriented X4 Safety/Judgment: Impaired (pt requires reinforcement to use RW)  Sensation Sensation Light Touch: Appears Intact (BLE)  Motor  Motor Motor: Hemiplegia (R side)   Mobility Bed Mobility Bed Mobility: Rolling Right;Rolling Left;Supine to Sit;Sit to Supine (regular bed) Rolling Right: 6: Modified independent (Device/Increase time) Rolling Left: 6: Modified independent (Device/Increase time) Supine to Sit: 6: Modified independent (Device/Increase time) Sit to  Supine: 6: Modified independent (Device/Increase  time) Transfers Transfers: Yes Sit to Stand: 6: Modified independent (Device/Increase time)   Locomotion  Ambulation Ambulation: Yes Ambulation/Gait Assistance: 6: Modified independent (Device/Increase time) Ambulation Distance (Feet): 150 Feet Assistive device: Rolling walker Gait Gait: Yes Gait Pattern: Decreased step length - right;Decreased weight shift to right;Decreased dorsiflexion - right (decreased gait speed) Stairs / Additional Locomotion Stairs: Yes Stairs Assistance: 6: Modified independent (Device/Increase time) Stair Management Technique: Two rails Number of Stairs: 12 Height of Stairs:  (6 inches + 3 Inches) Ramp: 6: Modified independent (Device) (with RW) Curb: 5: Supervision (with RW) Wheelchair Mobility Wheelchair Mobility: No   Trunk/Postural Assessment  Cervical Assessment Cervical Assessment: Within Functional Limits Thoracic Assessment Thoracic Assessment: Within Functional Limits Lumbar Assessment Lumbar Assessment: Within Functional Limits   Balance Balance Balance Assessed: Yes Standardized Balance Assessment Standardized Balance Assessment: Berg Balance Test Berg Balance Test Sit to Stand: Able to stand  independently using hands Standing Unsupported: Able to stand safely 2 minutes Sitting with Back Unsupported but Feet Supported on Floor or Stool: Able to sit safely and securely 2 minutes Stand to Sit: Sits safely with minimal use of hands Transfers: Able to transfer safely, minor use of hands Standing Unsupported with Eyes Closed: Able to stand 10 seconds safely Standing Ubsupported with Feet Together: Able to place feet together independently and stand 1 minute safely From Standing, Reach Forward with Outstretched Arm: Can reach confidently >25 cm (10") From Standing Position, Pick up Object from Floor: Able to pick up shoe safely and easily From Standing Position, Turn to Look Behind Over each Shoulder: Looks behind one side only/other  side shows less weight shift (decreased weight shifting/turning to R) Turn 360 Degrees: Able to turn 360 degrees safely but slowly (turns both directions but requires more than 4 seconds to each side) Standing Unsupported, Alternately Place Feet on Step/Stool: Able to complete 4 steps without aid or supervision (completes 8 steps with supervision) Standing Unsupported, One Foot in Front: Able to take small step independently and hold 30 seconds Standing on One Leg: Unable to try or needs assist to prevent fall (refused to try) Total Score: 44  Extremity Assessment  RLE Assessment RLE Assessment:  (3+/5 knee extension & dorsiflexion; ROM WFL) LLE Assessment LLE Assessment: Within Functional Limits   See Function Navigator for Current Functional Status.  Waunita Schooner 06/19/2016, 6:13 PM

## 2016-06-19 NOTE — Discharge Summary (Signed)
NAMEANUJIN, COSTON            ACCOUNT NO.:  [id number]  MEDICAL RECORD NO.:  [id number]  LOCATION:                                 FACILITY:  PHYSICIAN:  Meredith Staggers, M.D.DATE OF BIRTH:  1955-09-23  DATE OF ADMISSION:  06/09/2016 DATE OF DISCHARGE:  06/20/2016                              DISCHARGE SUMMARY   DISCHARGE DIAGNOSES: 1. 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 2. Pain management. 3. Dysphagia. 4. Diabetes mellitus, peripheral neuropathy. 5. Seizure prophylaxis. 6. Hypertension. 7. Hyperlipidemia. 8. Tobacco, cocaine abuse. 9. Hypokalemia, resolved. 10.Question acute on chronic renal insufficiency with baseline     creatinine 1.74.  HISTORY OF PRESENT ILLNESS:  This is a 60 year old right-handed female with history of poorly controlled diabetes mellitus, hypertension, chronic back pain, tobacco, and cocaine abuse.  She lives with significant other and independent prior to admission.  Presented on June 06, 2016, with tremors, repeated weakness of the left hand, and facial droop, as well as slurred speech over 3-4 day period with increasing lethargy and a recent fall.  Systolic blood pressure [id number]. Urine drug screen positive for cocaine.  Troponin negative.  Mildly elevated creatinine 1.7.  CT of the head showed acute hemorrhage arising from the left pons extending across the midline to the medial right lower pons near the inferior pontine region measuring 1.3 x 1.1 cm. While in the CT scanner reported seizure, was given Ativan.  EEG negative.  Placed on nicardipine for blood pressure control. Currently remains on lisinopril have recommended to monitor renal function with lattest creatinine 1.80.  Loaded with Keppra for seizure prophylaxis.  Followup MRI, MRA showed stable hemorrhage.  No related findings on intracranial MRA.  Hemoglobin A1c 11.4 with insulin therapy as directed, tolerating a dysphagia #2 nectar thick liquid diet.   An echocardiogram was completed showing ejection fraction of [id number], grade 1 diastolic dysfunction.  Physical and occupational therapy ongoing.  The patient was admitted for a comprehensive rehab program.  PAST MEDICAL HISTORY:  See discharge diagnoses.  SOCIAL HISTORY:  Independent prior to admission, living with significant other.  Functional history upon admission to rehab services was minimal assist for stand pivot transfers, moderate assist for supine to sit, min to mod assist for activities of daily living.  PHYSICAL EXAMINATION:  VITAL SIGNS:  Blood pressure 172/97, pulse 107, temperature 98, respirations 22. GENERAL:  This was an alert female.  Speech dysarthric, but intelligible.  Follow commands. LUNGS:  Clear to auscultation without wheeze. CARDIAC:  Regular rate and rhythm without murmur. ABDOMEN:  Soft, nontender.  Good bowel sounds.  REHABILITATION HOSPITAL COURSE:  The patient was admitted to inpatient rehab services with therapies initiated on a 3-hour daily basis consisting of physical therapy, occupational therapy, speech therapy, and rehabilitation nursing.  The following issues were addressed during the patient's rehabilitation stay.  Pertaining to Ms. Sahli's left pontine hemorrhage secondary to hypertensive crisis remained stable, she would follow up with Neurology Services as well as PCP for ongoing blood pressure control management.  SCDs for DVT prophylaxis.  Pain management with history of chronic back pain.  She continued on Neurontin 600 mg 4 times daily, hydrocodone as needed, as well as Flexeril, and  low-dose Ultram.  Blood sugars monitored with history of poorly controlled diabetes mellitus, hemoglobin A1c of 11.4.  She remained on insulin therapy with full diabetic teaching.  She continued on Keppra for seizure prophylaxis.  EEG negative.  Blood pressures again overall controlled.  She did have a history of tobacco, cocaine abuse.  She had completed a  course of her Nicoderm patch.  She received full counseling in regard to cessation of nicotine, illicit drug products.  It was questionable if she would be compliant with these requests.  Question acute on chronic renal insufficiency, creatinine 1.38, 1.74, remained stable. Her lisinopril was decreased to 10 mg daily due to mildly elevated creatinine and she remained on low-dose HCTZ.  She would follow up with her primary MD.  The patient received weekly collaborative interdisciplinary team conferences to discuss estimated length of stay, family teaching, any barriers to her discharge.  The patient able to stand pivot transfer, wheelchair with close supervision.  Ambulate without assistive device with minimal assistance with the shower.  She could don and doff for close minimal assistance, close supervision for safety.  She was able to communicate her needs.  Her diet slowly advanced to a mechanical soft, still with nectar thick liquids.  Close monitoring of hydration, any signs of aspiration.  Full family teaching was completed and plan discharge to home.  DISCHARGE MEDICATIONS:  Included: 1. Zovirax 400 mg p.o. q.i.d. 2. Norvasc 10 mg p.o. daily. 3. Folic acid 1 mg p.o. daily. 4. Neurontin 600 mg p.o. q.i.d. 5. Insulin 70/30, 33 units at supper, 53 units at breakfast. 6. Keppra 1000 mg p.o. every 12 hours. 7. Lisinopril 10 mg p.o. daily. 8. Metoprolol 25 mg p.o. b.i.d. 9. Multivitamin daily. 10.Nicoderm patch, taper as directed. 11.Protonix 40 mg p.o. b.i.d. 12.Zocor 10 mg p.o. at bedtime. 13.Desyrel 100 mg p.o. at bedtime. 14.Flexeril 10 mg p.o. b.i.d. as needed  muscle spasms. 15.Ultram 50 mg p.o. daily as needed. 16.HCTZ 12.5 mg daily  DIET:  Mechanical soft with nectar liquids.  Special instructions. Follow-up chemistries Monday 06/26/2017 to monitor renal function.   Lauraine Rinne, P.A.   ______________________________ Meredith Staggers, M.D.    DA/MEDQ  D:   06/19/2016  T:  06/19/2016  Job:  [id number]  cc:   Nolene Ebbs, M.D. Pramod P. Leonie Man, MD

## 2016-06-19 NOTE — Progress Notes (Signed)
Occupational Therapy Discharge Summary  Patient Details  Name: [name] MRN: [id number] Date of Birth: 21-Nov-1955  Patient has met 8 of 8 long term goals due to improved activity tolerance, improved balance, ability to compensate for deficits, improved attention, improved awareness and improved coordination.  Pt made steady progress with BDLs during this admission.  Pt stated she will have assistance with home mgmt tasks and cooking.  Pt continues to fatigue easily and requires multiple rest breaks during bathing/dressing tasks.  Pt performs all BADLs at mod I level.  Husband's care giver will be present during the day.  Family has not been present for education. Patient to discharge at overall Modified Independent level.  Patient's care partner is independent to provide the necessary physical assistance at discharge.    Recommendation:  Patient will benefit from ongoing skilled OT services in home health setting to continue to advance functional skills in the area of iADL.  Equipment: No equipment provided Pt already owns tub transfer bench and BSC  Reasons for discharge: treatment goals met  Patient/family agrees with progress made and goals achieved: Yes  OT Discharge Vision/Perception  Vision- History Baseline Vision/History: Wears glasses Wears Glasses: Reading only Patient Visual Report: Blurring of vision Vision- Assessment Additional Comments: continued blurring of vision but improved  Cognition Overall Cognitive Status: Within Functional Limits for tasks assessed Arousal/Alertness: Awake/alert Orientation Level: Oriented X4 Attention: Sustained;Selective Focused Attention: Appears intact Sustained Attention: Appears intact Selective Attention: Impaired Memory: Appears intact Awareness: Impaired Awareness Impairment: Emergent impairment Problem Solving: Appears intact Problem Solving Impairment: Functional basic Safety/Judgment:  Impaired Sensation Sensation Light Touch: Appears Intact Stereognosis: Appears Intact Hot/Cold: Appears Intact Proprioception: Appears Intact Coordination Gross Motor Movements are Fluid and Coordinated: Yes Fine Motor Movements are Fluid and Coordinated: No Motor  Motor Motor: Hemiplegia    Trunk/Postural Assessment  Cervical Assessment Cervical Assessment: Within Functional Limits Thoracic Assessment Thoracic Assessment: Within Functional Limits Lumbar Assessment Lumbar Assessment: Within Functional Limits  Balance Dynamic Sitting Balance Dynamic Sitting - Level of Assistance: 7: Independent Extremity/Trunk Assessment RUE Assessment RUE Assessment: Within Functional Limits LUE Assessment LUE Assessment: Within Functional Limits   See Function Navigator for Current Functional Status.  Leotis Shames [facility] 06/19/2016, 3:06 PM

## 2016-06-19 NOTE — Progress Notes (Signed)
[facility]ICINE & REHABILITATION     PROGRESS NOTE    Subjective/Complaints: Up at EOB eating. No complaints.   ROS: pt denies nausea, vomiting, diarrhea, cough, shortness of breath or chest pain      Objective: Vital Signs: Blood pressure 121/68, pulse 78, temperature 98.5 F (36.9 C), temperature source Oral, resp. rate 18, height 5' 8.5" (1.74 m), weight 94.2 kg (207 lb 10.8 oz), SpO2 97 %. No results found. No results for input(s): WBC, HGB, HCT, PLT in the last 72 hours.  Recent Labs  06/19/16 0630  NA 138  K 5.1  CL 103  GLUCOSE 126*  BUN 10  CREATININE 1.80*  CALCIUM 8.8*   CBG (last 3)   Recent Labs  06/18/16 1625 06/18/16 2043 06/19/16 0638  GLUCAP 141* 232* 119*    Wt Readings from Last 3 Encounters:  06/14/16 94.2 kg (207 lb 10.8 oz)  06/07/16 93.4 kg (205 lb 14.6 oz)  01/14/16 101.2 kg (223 lb)    Physical Exam:  Constitutional: She appears well-developed.  Eyes: PERRL.    Neck: suppple.  Cardiovascular: RRR. No jvd Respiratory: Equal breath sounds anteriorly, no wheezes GI: Soft.   Neurological: She is alert.  Alert. Speech remains dysarthric but clearer. Reasonable insight and awareness. Follows all commands. . Continued CN 7. Marland KitchenRUE remains 4-/5 bicep, tricep, wrist, hand. RLE 4 to 4+/5 prox to distal. LUE 5/5. LLE 5/5. No sensory findings  Skin: intact.  Psychiatric: alert  Assessment/Plan: 1. 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 Physiatrist is providing close team supervision and 24 hour management of active medical problems listed below. Physiatrist and rehab team continue to assess barriers to discharge/monitor patient progress toward functional and medical goals.  Function:  Bathing Bathing position   Position: Shower  Bathing parts Body parts bathed by patient: Right  arm, Left arm, Abdomen, Chest, Front perineal area, Buttocks, Right upper leg, Left upper leg Body parts bathed by helper: Back  Bathing assist Assist Level: Supervision or verbal cues      Upper Body Dressing/Undressing Upper body dressing   What is the patient wearing?: Pull over shirt/dress     Pull over shirt/dress - Perfomed by patient: Thread/unthread right sleeve, Thread/unthread left sleeve, Put head through opening, Pull shirt over trunk          Upper body assist Assist Level: Set up      Lower Body Dressing/Undressing Lower body dressing   What is the patient wearing?: Underwear, Pants, Non-skid slipper socks Underwear - Performed by patient: Thread/unthread right underwear leg, Thread/unthread left underwear leg, Pull underwear up/down   Pants- Performed by patient: Thread/unthread right pants leg, Thread/unthread left pants leg, Pull pants up/down Pants- Performed by helper: Pull pants up/down Non-skid slipper socks- Performed by patient: Don/doff right sock, Don/doff left sock Non-skid slipper socks- Performed by helper: Don/doff right sock, Don/doff left sock       Shoes - Performed by helper: Don/doff right shoe, Don/doff left shoe          Lower body assist Assist for lower body dressing: Supervision or verbal cues      Toileting Toileting   Toileting steps completed by patient: Adjust clothing prior to toileting, Performs perineal hygiene, Adjust clothing after toileting Toileting steps completed by helper: Adjust clothing prior to toileting, Performs perineal hygiene, Adjust clothing after toileting Toileting Assistive Devices: Grab bar  or rail  Toileting assist Assist level: Touching or steadying assistance (Pt.75%)   Transfers Chair/bed transfer   Chair/bed transfer method: Ambulatory Chair/bed transfer assist level: Supervision or verbal cues Chair/bed transfer assistive device: Armrests, Medical sales representative     Max distance:  150 ft Assist level: Supervision or verbal cues   Wheelchair   Type: Manual Max wheelchair distance: 146ft  Assist Level: Supervision or verbal cues  Cognition Comprehension Comprehension assist level: Follows basic conversation/direction with no assist  Expression Expression assist level: Expresses basic needs/ideas: With no assist  Social Interaction Social Interaction assist level: Interacts appropriately with others - No medications needed.  Problem Solving Problem solving assist level: Solves basic 75 - 89% of the time/requires cueing 10 - 24% of the time  Memory Memory assist level: Recognizes or recalls 90% of the time/requires cueing < 10% of the time   Medical Problem List and Plan:  1. Right hemiparesthesias and dysarthria /dysphagia secondary to left pontine hemorrhage secondary to hypertensive crisis  -Continue CIR PT, OT, speech -finalize dc planning for tomorrow 2. DVT Prophylaxis/Anticoagulation: SCD's 3. Pain Management/chronic back pain: Ultram 50 mg daily as needed as well as Flexeril. Patient had been on Neurontin 600 mg 4 times a day, hydrocodone as needed prior to admission.  4. Dysphagia. Dysphagia #2 nectar thick liquids.   --encourage PO  -continue water protocol 5. Neuropsych: This patient is capable of making decisions on her own behalf.  6. Skin/Wound Care: Routine skin checks  7. Fluids/Electrolytes/Nutrition: encourage PO fluids. -recheck labs Monday appetite/ Intake varies, complicates diabetic management  8. Diabetes mellitus with peripheral neuropathy. Hemoglobin A1c 11.4.  -  70/30 to 50u am and 30u pm, adjusted to 53/33  -further titration as outpt  Recent Labs  06/18/16 1625 06/18/16 2043 06/19/16 0638  GLUCAP 141* 232* 119*      9. Seizure prophylaxis. Keppra 1000 mg every 12 hours. EEG negative  10. Hypertension. Lisinopril 20 mg daily, HCTZ 12.5 mg daily.   -improved control overall-  -stopped hctz due to nectar fluids/CRI  -increased  metoprolol to 25mg  bid , improved. HR 80's Vitals:   06/18/16 1401 06/19/16 0627  BP: (!) 115/55 121/68  Pulse: 87 78  Resp: 17 18  Temp: 98.9 F (37.2 C) 98.5 F (36.9 C)   11. Hyperlipidemia. Patient on Zocor 10 mg daily at bedtime prior to admission.  12. Tobacco/cocaine abuse. Nicoderm patch. Provide counseling  13. Hypokalemia. Potassium supplement   14. Question acute on chronic renal insufficiency. Baseline creatinine 1.74 on admission his creatinine 1.38. Back to 1.8 today  -recheck again in AM prior to d/c 15. Hx---COPD  -improved with mucinex  -continue IS,FV   LOS (Days) 10 A FACE TO FACE EVALUATION WAS PERFORMED  [name] T 06/19/2016 8:28 AM

## 2016-06-19 NOTE — Progress Notes (Signed)
Occupational Therapy Session Note  Patient Details  Name: [name] MRN: [id number] Date of Birth: [birth year]  Today's Date: 06/19/2016 OT Individual Time: 0800-0900 OT Individual Time Calculation (min): 60 min     Short Term Goals: Week 2:  OT Short Term Goal 1 (Week 2): STG=LTG secondary to ELOS  Skilled Therapeutic Interventions/Progress Updates:    Pt engaged in BADL retraining with focus on continued safety awareness, standing balance, and functional amb with RW.  Pt completed all BADLs at mod I level.  Pt requires more than a reasonable amount of time to complete all tasks with multiple rest breaks.  Continued discharge planning.  Therapy Documentation Precautions:  Precautions Precautions: None Precaution Comments:  (Monitor BP) Restrictions Weight Bearing Restrictions: No General:   Vital Signs: Therapy Vitals Temp: 98.5 F (36.9 C) Temp Source: Oral Pulse Rate: 96 BP: (!) 144/85 Patient Position (if appropriate): Sitting Pain: Pain Assessment Pain Assessment: 0-10 Pain Score: 5  Pain Type: Acute pain Pain Location: Abdomen ("my insides feel like mush") Pain Orientation: Right Pain Descriptors / Indicators: Aching Pain Frequency: Occasional Pain Onset: Gradual Patients Stated Pain Goal: 2 Pain Intervention(s): RN made aware Multiple Pain Sites: No ADL: ADL ADL Comments: Please see functional navigator for ADL status Exercises:   Other Treatments:    See Function Navigator for Current Functional Status.   Therapy/Group: Individual Therapy  Leroy Libman 06/19/2016, 12:07 PM

## 2016-06-20 DIAGNOSIS — F4323 Adjustment disorder with mixed anxiety and depressed mood: Secondary | ICD-10-CM

## 2016-06-20 LAB — BASIC METABOLIC PANEL
ANION GAP: 9 (ref 5–15)
BUN: 9 mg/dL (ref 6–20)
CHLORIDE: 106 mmol/L (ref 101–111)
CO2: 24 mmol/L (ref 22–32)
Calcium: 8.9 mg/dL (ref 8.9–10.3)
Creatinine, Ser: 1.66 mg/dL — ABNORMAL HIGH (ref 0.44–1.00)
GFR calc Af Amer: 38 mL/min — ABNORMAL LOW (ref >60)
GFR, EST NON AFRICAN AMERICAN: 33 mL/min — AB (ref >60)
GLUCOSE: 112 mg/dL — AB (ref 65–99)
POTASSIUM: 5.3 mmol/L — AB (ref 3.5–5.1)
SODIUM: 139 mmol/L (ref 135–145)

## 2016-06-20 LAB — GLUCOSE, CAPILLARY: GLUCOSE-CAPILLARY: 96 mg/dL (ref 65–99)

## 2016-06-20 MED ORDER — ACYCLOVIR 400 MG PO TABS: 400.0000 mg | ORAL_TABLET | Freq: Four times a day (QID) | ORAL | 0 refills | Status: DC

## 2016-06-20 MED ORDER — INSULIN ASPART PROT & ASPART (70-30 MIX) 100 UNIT/ML ~~LOC~~ SUSP: 53.0000 [IU] | Freq: Every day | SUBCUTANEOUS | 11 refills | Status: DC

## 2016-06-20 MED ORDER — LISINOPRIL 10 MG PO TABS
10.0000 mg | ORAL_TABLET | Freq: Every day | ORAL | Status: DC
  Administered 2016-06-20: 10 mg via ORAL

## 2016-06-20 MED ORDER — TRAMADOL HCL 50 MG PO TABS: 50.0000 mg | ORAL_TABLET | Freq: Every day | ORAL | 0 refills | Status: DC | PRN

## 2016-06-20 MED ORDER — TRAZODONE HCL 100 MG PO TABS: 100.0000 mg | ORAL_TABLET | Freq: Every day | ORAL | 0 refills | Status: DC

## 2016-06-20 MED ORDER — NICOTINE 14 MG/24HR TD PT24: MEDICATED_PATCH | TRANSDERMAL | 0 refills | Status: DC

## 2016-06-20 MED ORDER — HYDROCHLOROTHIAZIDE 12.5 MG PO CAPS
12.5000 mg | ORAL_CAPSULE | Freq: Every day | ORAL | Status: DC
  Administered 2016-06-20: 12.5 mg via ORAL
  Filled 2016-06-20: qty 1

## 2016-06-20 MED ORDER — PANTOPRAZOLE SODIUM 40 MG PO TBEC: 40.0000 mg | DELAYED_RELEASE_TABLET | Freq: Every day | ORAL | 0 refills | Status: DC

## 2016-06-20 MED ORDER — GABAPENTIN 300 MG PO CAPS: 600.0000 mg | ORAL_CAPSULE | Freq: Four times a day (QID) | ORAL | 0 refills | Status: DC

## 2016-06-20 MED ORDER — GUAIFENESIN ER 600 MG PO TB12
600.0000 mg | ORAL_TABLET | Freq: Two times a day (BID) | ORAL | 0 refills | Status: DC
Start: 2016-06-20 — End: 2016-11-12

## 2016-06-20 MED ORDER — SIMVASTATIN 10 MG PO TABS: 10.0000 mg | ORAL_TABLET | Freq: Every day | ORAL | 0 refills | Status: DC

## 2016-06-20 MED ORDER — HYDROCHLOROTHIAZIDE 12.5 MG PO CAPS: 12.5000 mg | ORAL_CAPSULE | Freq: Every day | ORAL | 1 refills | Status: DC

## 2016-06-20 MED ORDER — LEVETIRACETAM 1000 MG PO TABS: 1000.0000 mg | ORAL_TABLET | Freq: Two times a day (BID) | ORAL | 0 refills | Status: DC

## 2016-06-20 MED ORDER — LISINOPRIL 20 MG PO TABS: 20.0000 mg | ORAL_TABLET | Freq: Every day | ORAL | 1 refills | Status: DC

## 2016-06-20 MED ORDER — RESOURCE THICKENUP CLEAR PO POWD: 3.0000 | ORAL | 1 refills | Status: DC | PRN

## 2016-06-20 MED ORDER — CYCLOBENZAPRINE HCL 10 MG PO TABS: 10.0000 mg | ORAL_TABLET | Freq: Two times a day (BID) | ORAL | 0 refills | Status: DC | PRN

## 2016-06-20 MED ORDER — FOLIC ACID 1 MG PO TABS: 1.0000 mg | ORAL_TABLET | Freq: Every day | ORAL | 0 refills | Status: DC

## 2016-06-20 MED ORDER — AMLODIPINE BESYLATE 10 MG PO TABS: 10.0000 mg | ORAL_TABLET | Freq: Every day | ORAL | 1 refills | Status: DC

## 2016-06-20 MED ORDER — LISINOPRIL 10 MG PO TABS: 10.0000 mg | ORAL_TABLET | Freq: Every day | ORAL | 1 refills | Status: DC

## 2016-06-20 MED ORDER — METOPROLOL TARTRATE 25 MG PO TABS: 25.0000 mg | ORAL_TABLET | Freq: Two times a day (BID) | ORAL | 0 refills | Status: DC

## 2016-06-20 MED ORDER — INSULIN ASPART PROT & ASPART (70-30 MIX) 100 UNIT/ML ~~LOC~~ SUSP
33.0000 [IU] | Freq: Every day | SUBCUTANEOUS | 11 refills | Status: DC
Start: 2016-06-20 — End: 2016-11-06

## 2016-06-20 MED ORDER — ADULT MULTIVITAMIN W/MINERALS CH: 1.0000 | ORAL_TABLET | Freq: Every day | ORAL | Status: DC

## 2016-06-20 NOTE — Progress Notes (Signed)
[facility] PHYSICAL MEDICINE & REHABILITATION     PROGRESS NOTE    Subjective/Complaints: Sitting up in bed. No new issues. Anxious to go home!  ROS: pt denies nausea, vomiting, diarrhea, cough, shortness of breath or chest pain     Objective: Vital Signs: Blood pressure (!) 173/90, pulse 93, temperature 98.4 F (36.9 C), temperature source Oral, resp. rate 16, height 5' 8.5" (1.74 m), weight 94.2 kg (207 lb 10.8 oz), SpO2 95 %. No results found. No results for input(s): WBC, HGB, HCT, PLT in the last 72 hours.  Recent Labs  06/19/16 0630 06/20/16 0533  NA 138 139  K 5.1 5.3*  CL 103 106  GLUCOSE 126* 112*  BUN 10 9  CREATININE 1.80* 1.66*  CALCIUM 8.8* 8.9   CBG (last 3)   Recent Labs  06/19/16 1647 06/19/16 2046 06/20/16 0627  GLUCAP 160* 185* 96    Wt Readings from Last 3 Encounters:  06/14/16 94.2 kg (207 lb 10.8 oz)  06/07/16 93.4 kg (205 lb 14.6 oz)  01/14/16 101.2 kg (223 lb)    Physical Exam:  Constitutional: She appears well-developed.  Eyes: PERRL.    Neck: suppple.  Cardiovascular: RRR. No jvd Respiratory: Equal breath sounds anteriorly, no wheezes GI: Soft.   Neurological: She is alert.  Alert. Speech remains dysarthric but clearer. Reasonable insight and awareness. Follows all commands. . Continued CN 7. Marland KitchenRUE remains 4-/5 bicep, tricep, wrist, hand. RLE 4 to 4+/5 prox to distal. LUE 5/5. LLE 5/5. Improved coordoination RUE  Skin: intact.  Psychiatric: alert  Assessment/Plan: 1. 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 Physiatrist is providing close team supervision and 24 hour management of active medical problems listed below. Physiatrist and rehab team continue to assess barriers to discharge/monitor patient progress toward functional and medical goals.  Function:  Bathing Bathing position    Position: Shower  Bathing parts Body parts bathed by patient: Right arm, Left arm, Abdomen, Chest, Front perineal area, Buttocks, Right upper leg, Left upper leg, Right lower leg, Left lower leg, Back Body parts bathed by helper: Back  Bathing assist Assist Level: Assistive device, More than reasonable time      Upper Body Dressing/Undressing Upper body dressing   What is the patient wearing?: Pull over shirt/dress     Pull over shirt/dress - Perfomed by patient: Thread/unthread right sleeve, Thread/unthread left sleeve, Put head through opening, Pull shirt over trunk          Upper body assist Assist Level: No help, No cues      Lower Body Dressing/Undressing Lower body dressing   What is the patient wearing?: Underwear, Pants, Non-skid slipper socks Underwear - Performed by patient: Thread/unthread right underwear leg, Thread/unthread left underwear leg, Pull underwear up/down   Pants- Performed by patient: Thread/unthread right pants leg, Thread/unthread left pants leg, Pull pants up/down Pants- Performed by helper: Pull pants up/down Non-skid slipper socks- Performed by patient: Don/doff right sock, Don/doff left sock Non-skid slipper socks- Performed by helper: Don/doff right sock, Don/doff left sock       Shoes - Performed by helper: Don/doff right shoe, Don/doff left shoe          Lower body assist Assist for lower body dressing: Assistive device, More than reasonable time      Toileting Toileting   Toileting steps completed by patient: Adjust clothing prior to toileting, Performs perineal hygiene,  Adjust clothing after toileting Toileting steps completed by helper: Adjust clothing prior to toileting, Performs perineal hygiene, Adjust clothing after toileting Toileting Assistive Devices: Grab bar or rail  Toileting assist Assist level: More than reasonable time   Transfers Chair/bed transfer   Chair/bed transfer method: Ambulatory Chair/bed transfer assist  level: No Help, no cues, assistive device, takes more than a reasonable amount of time Chair/bed transfer assistive device: Medical sales representative     Max distance: >150 ft Assist level: No help, No cues, assistive device, takes more than a reasonable amount of time   Wheelchair Wheelchair activity did not occur: N/A Type: Manual Max wheelchair distance: 124ft  Assist Level: Supervision or verbal cues  Cognition Comprehension Comprehension assist level: Follows basic conversation/direction with no assist  Expression Expression assist level: Expresses complex 90% of the time/cues < 10% of the time  Social Interaction Social Interaction assist level: Interacts appropriately 90% of the time - Needs monitoring or encouragement for participation or interaction.  Problem Solving Problem solving assist level: Solves complex 90% of the time/cues < 10% of the time  Memory Memory assist level: Recognizes or recalls 90% of the time/requires cueing < 10% of the time   Medical Problem List and Plan:  1. 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 DVT Prophylaxis/Anticoagulation: SCD's 3. Pain Management/chronic back pain: Ultram 50 mg daily as needed as well as Flexeril. Patient had been on Neurontin 600 mg 4 times a day, hydrocodone as needed prior to admission.  4. Dysphagia. Dysphagia #2 nectar thick liquids.   --encourage PO  -continue water protocol 5. Neuropsych: This patient is capable of making decisions on her own behalf.  6. Skin/Wound Care: Routine skin checks  7. Fluids/Electrolytes/Nutrition: encourage PO fluids.   -elevated  8. Diabetes mellitus with peripheral neuropathy. Hemoglobin A1c 11.4.  -  70/30 to 50u am and 30u pm, adjusted to 53/33  -further titration as outpt  Recent Labs  06/19/16 1647 06/19/16 2046 06/20/16 0627  GLUCAP 160* 185* 96       9. Seizure prophylaxis. Keppra 1000 mg every 12 hours. EEG negative  10. Hypertension. Lisinopril 20 mg daily, HCTZ 12.5 mg daily.   -improved control overall-  -stopped hctz due to nectar fluids/CRI  -increased metoprolol to 25mg  bid , improved. HR 80's  -resume low dose hctz  -reduce ACE due to hyperkalemia Vitals:   06/19/16 2010 06/20/16 0546  BP: (!) 141/69 (!) 173/90  Pulse: 94 93  Resp: 18 16  Temp:  98.4 F (36.9 C)   11. Hyperlipidemia. Patient on Zocor 10 mg daily at bedtime prior to admission.  12. Tobacco/cocaine abuse. Nicoderm patch. Provide counseling  13. Hypokalemia. Potassium supplement   14. Question acute on chronic renal insufficiency. Baseline creatinine 1.74 on admission his creatinine 1.38. Back to 1.66 on 12/12  -  15. Hx---COPD  -improved with mucinex  -continue IS,FV   LOS (Days) 11 A FACE TO FACE EVALUATION WAS PERFORMED  SWARTZ,ZACHARY T 06/20/2016 8:22 AM

## 2016-06-20 NOTE — Progress Notes (Signed)
Social Work Discharge Note  The overall goal for the admission was met for:   Discharge location: Yes - home with her husband and his caregiver  Length of Stay: Yes - 11 days  Discharge activity level: Yes - modified independent  Home/community participation: Yes  Services provided included: MD, RD, PT, OT, SLP, RN, Pharmacy and SW  Financial Services: Medicaid  Follow-up services arranged: Home Health: PT/OT/ST/RN/SW from [facility], DME: rolling walker from [facility] and Patient/Family has no preference for HH/DME agencies  Comments (or additional information): CSW made SCAT referral for pt and PCS referral.  Await SCAT result, but PCS was declined due to pt not needing hands on assistance.  Pt needs supervision for meal prep, housekeeping, and laundry or to have someone else do those tasks.  Otherwise, pt is mod I.  CSW talked with pt about healthy lifestyle choices and she was open to [facility] giving her counseling resources (done), but not for substance abuse counseling.  Perhaps pt will receive support for this through private counselor at some point.    Patient/Family verbalized understanding of follow-up arrangements: Yes  Individual responsible for coordination of the follow-up plan: pt with support from family and husband's caregiver  Confirmed correct DME delivered: Trey Sailors 06/20/2016    Mousa Prout, Silvestre Mesi

## 2016-06-20 NOTE — Progress Notes (Addendum)
Noted during assessment 2031 right inner forearm nodule. Patient noted the nodule had been there for a while

## 2016-06-20 NOTE — Discharge Instructions (Signed)
Inpatient Rehab Discharge Instructions  [name] Discharge date and time: No discharge date for patient encounter.   Activities/Precautions/ Functional Status: Activity: activity as tolerated Diet: Dysphagia 3 nectar liquids Wound Care: none needed Functional status:  ___ No restrictions     ___ Walk up steps independently ___ 24/7 supervision/assistance   ___ Walk up steps with assistance ___ Intermittent supervision/assistance  ___ Bathe/dress independently ___ Walk with walker     _x__ Bathe/dress with assistance ___ Walk Independently    ___ Shower independently ___ Walk with assistance    ___ Shower with assistance ___ No alcohol     ___ Return to work/school ________  COMMUNITY REFERRALS UPON DISCHARGE:   Home Health:   PT     OT     ST    RN     SW    Agency:  [facility] Phone:  [phone] Medical Equipment/Items Ordered:  Rolling walker  Agency/Supplier:  [facility]        Phone:  [phone]  GENERAL COMMUNITY RESOURCES FOR PATIENT/FAMILY: Support Groups:  [facility] Stroke Support Group                              Meets the second Thursday of every month (except June, July, and August) at 3 PM                              In the dayroom at the [place] [facility] [address]  Mental Health:  Refer to the list Sonia Baller gave you for counseling resources.  SCAT application submitted by you and Sonia Baller.  They should call you.  Also check with your Medicaid caseworker regarding medical transportation.    Special Instructions: No smoking alcohol or illicit drug products Follow-up chemistries 06/26/2016 results to DR AVUBERE [id number]   STROKE/TIA DISCHARGE INSTRUCTIONS SMOKING Cigarette smoking nearly doubles your risk of having a stroke & is the single most alterable risk factor  If you smoke or have smoked in the last 12 months, you are advised to quit smoking for your health.  Most of the excess cardiovascular risk  related to smoking disappears within a year of stopping.  Ask you doctor about anti-smoking medications  [facility] Quit Line: 1-800-QUIT NOW  Free Smoking Cessation Classes [phone]  CHOLESTEROL Know your levels; limit fat & cholesterol in your diet  Lipid Panel     Component Value Date/Time   CHOL 157 06/06/2016 1508   TRIG 214 (H) 06/06/2016 1508   HDL 33 (L) 06/06/2016 1508   CHOLHDL 4.8 06/06/2016 1508   VLDL 43 (H) 06/06/2016 1508   [place] 81 06/06/2016 1508      Many patients benefit from treatment even if their cholesterol is at goal.  Goal: Total Cholesterol (CHOL) less than 160  Goal:  Triglycerides (TRIG) less than 150  Goal:  HDL greater than 40  Goal:  LDL (LDLCALC) less than 100   BLOOD PRESSURE American Stroke Association blood pressure target is less that 120/80 mm/Hg  Your discharge blood pressure is:  BP: 121/68  Monitor your blood pressure  Limit your salt and alcohol intake  Many individuals will require more than one medication for high blood pressure  DIABETES (A1c is a blood sugar average for last 3 months) Goal HGBA1c is under 7% (HBGA1c is blood sugar average for last 3  months)  Diabetes:    Lab Results  Component Value Date   HGBA1C 11.4 (H) 06/06/2016     Your HGBA1c can be lowered with medications, healthy diet, and exercise.  Check your blood sugar as directed by your physician  Call your physician if you experience unexplained or low blood sugars.  PHYSICAL ACTIVITY/REHABILITATION Goal is 30 minutes at least 4 days per week  Activity: Increase activity slowly, Therapies: Physical Therapy: Home Health Return to work:   Activity decreases your risk of heart attack and stroke and makes your heart stronger.  It helps control your weight and blood pressure; helps you relax and can improve your mood.  Participate in a regular exercise program.  Talk with your doctor about the best form of exercise for you (dancing, walking, swimming,  cycling).  DIET/WEIGHT Goal is to maintain a healthy weight  Your discharge diet is: DIET DYS 3 Room service appropriate? Yes; Fluid consistency: Nectar Thick  liquids Your height is:  Height: 5' 8.5" (174 cm) Your current weight is: Weight: 94.2 kg (207 lb 10.8 oz) Your Body Mass Index (BMI) is:  BMI (Calculated): 38.4  Following the type of diet specifically designed for you will help prevent another stroke.  Your goal weight range is:    Your goal Body Mass Index (BMI) is 19-24.  Healthy food habits can help reduce 3 risk factors for stroke:  High cholesterol, hypertension, and excess weight.  RESOURCES Stroke/Support Group:  Call [phone]   STROKE EDUCATION PROVIDED/REVIEWED AND GIVEN TO PATIENT Stroke warning signs and symptoms How to activate emergency medical system (call 911). Medications prescribed at discharge. Need for follow-up after discharge. Personal risk factors for stroke. Pneumonia vaccine given:  Flu vaccine given:  My questions have been answered, the writing is legible, and I understand these instructions.  I will adhere to these goals & educational materials that have been provided to me after my discharge from the hospital.      My questions have been answered and I understand these instructions. I will adhere to these goals and the provided educational materials after my discharge from the hospital.  Patient/Caregiver Signature _______________________________ Date __________  Clinician Signature _______________________________________ Date __________  Please bring this form and your medication list with you to all your follow-up doctor's appointments.

## 2016-06-20 NOTE — Consult Note (Signed)
NEUROBEHAVIORAL STATUS EXAM - [facility]FIDENTIAL [facility]   MEDICAL NECESSITY:  Mendi Swapp was seen on the [facility] for a neurobehavioral status exam owing to the patient's diagnosis of CVA, and to assist in treatment planning during admission.   Records indicate that Ms. Sharbaugh is a "60 y.o. right handed female with history of poorly controlled diabetes mellitus, hypertension, chronic back pain, tobacco cocaine abuse. Per chart review patient lives with significant other and independent prior to admission. 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 Presented 06/06/2016 with tremors and reported weakness of the left hand and facial droop as well as slurred speech over the past 3-4 days with increasing lethargy as well as a recent fall 2 days prior to admission. SBP 151-202. Urine drug screen positive for cocaine. Troponin negative. Mildly elevated creatinine 1.7. 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 While in the CT scan reported seizure and was given Ativan. EEG showed no seizure activity. Placed on nicardipine drip for blood pressure control. Loaded with Keppra for seizure prophylaxis. Follow-up MRI/MRA showed stable hemorrhage, no related findings on intracranial MRA."  During today's visit, Ms. Polak denied experiencing any cognitive issues post-stroke.  She endorsed difficulty swallowing and poor balance. From an emotional standpoint, she described her current mood as "up and down."  She has a history of being diagnosed with depression and anxiety after she was diagnosed with diabetes.  At that time she participated in counseling which she found to be helpful.  She was unaware whether she is currently taking any psychopharmacological medications. Suicidal/homicidal ideation, plan  or intent was denied. No manic or hypomanic episodes were reported. The patient denied ever experiencing any auditory/visual hallucinations. No major behavioral or personality changes were endorsed.   Ms. Lorman feels that she has been making progress in therapy and she described the rehab staff as "very good."  She currently lives with her long-term boyfriend of 35 years and she assists with his care given his disabilities.  He is her biggest source of support.  She has noted trepidation about discharging home.  PROCEDURES: [2 units 96116] Diagnostic clinical interview  Review of available records  MENTAL STATUS EXAM: APPEARANCE:  Somewhat disheveled GEN:  Alert and oriented MOOD:  Depressed       AFFECT:  Blunted  SPEECH:  Hypophonic     THOUGHT CONTENT:  Appropriate HALLUCINATIONS:  None INTELLIGENCE:  Below Average  INSIGHT:  Fair/poor JUDGMENT:  Fair   IMPRESSION: Ms. Cuffe denied experiencing any significant cognitive issues post-stroke.  No overt issues were observed though the patient appeared to have limited insight into the repercussions of her current medical circumstances.  She tended to minimize her cocaine use and asked this provider if she was allowed to drink alcohol.  She expressed a desire to drink approximately a 6 pack per day that she splits with her boyfriend.  Education on cerebrovascular risk factors was provided.  For mood symptoms, she expressed interest in psychotherapy and I already asked her social worker to help find resources in her area. If transportation was available, she is also interested in the stroke support group.  Her care providers could also consider antidepressant therapy if not already utilized.   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

## 2016-06-21 ENCOUNTER — Other Ambulatory Visit: Payer: Self-pay | Admitting: *Deleted

## 2016-06-21 MED ORDER — SIMVASTATIN 10 MG PO TABS: 10.0000 mg | ORAL_TABLET | Freq: Every day | ORAL | 0 refills | Status: DC

## 2016-06-21 MED ORDER — TRAZODONE HCL 100 MG PO TABS: 100.0000 mg | ORAL_TABLET | Freq: Every day | ORAL | 0 refills | Status: DC

## 2016-06-21 MED ORDER — HYDROCHLOROTHIAZIDE 12.5 MG PO CAPS: 12.5000 mg | ORAL_CAPSULE | Freq: Every day | ORAL | 1 refills | Status: DC

## 2016-06-21 MED ORDER — CYCLOBENZAPRINE HCL 10 MG PO TABS: 10.0000 mg | ORAL_TABLET | Freq: Two times a day (BID) | ORAL | 0 refills | Status: DC | PRN

## 2016-06-21 MED ORDER — GABAPENTIN 300 MG PO CAPS: 600.0000 mg | ORAL_CAPSULE | Freq: Four times a day (QID) | ORAL | 0 refills | Status: DC

## 2016-06-21 NOTE — Telephone Encounter (Signed)
[facility] RN called to report some missing medications from hospital.  I have sent them to the pharmacy.

## 2016-06-28 ENCOUNTER — Telehealth: Payer: Self-pay

## 2016-06-28 NOTE — Telephone Encounter (Signed)
Merry Proud from [facility] called to confirm that orders will be signed by Dr. Naaman Plummer for this patient

## 2016-07-17 DIAGNOSIS — I69291 Dysphagia following other nontraumatic intracranial hemorrhage: Secondary | ICD-10-CM | POA: Diagnosis not present

## 2016-07-17 DIAGNOSIS — I10 Essential (primary) hypertension: Secondary | ICD-10-CM | POA: Diagnosis not present

## 2016-07-17 DIAGNOSIS — I169 Hypertensive crisis, unspecified: Secondary | ICD-10-CM | POA: Diagnosis not present

## 2016-07-17 DIAGNOSIS — I69292 Facial weakness following other nontraumatic intracranial hemorrhage: Secondary | ICD-10-CM | POA: Diagnosis not present

## 2016-07-24 ENCOUNTER — Telehealth: Payer: Self-pay

## 2016-07-24 NOTE — Telephone Encounter (Signed)
Patient has canceled her 1/16 appointment due to having the flu and has an appointment schedule for 08/09/2016 with Dr.Swartz. Patient would like a refill on her medication Protonix.  I advised patient to follow up with her PCP Nolene Ebbs, MD

## 2016-07-25 ENCOUNTER — Encounter: Payer: Medicaid Other | Admitting: Physical Medicine & Rehabilitation

## 2016-08-09 ENCOUNTER — Encounter: Payer: Medicaid Other | Attending: Physical Medicine & Rehabilitation | Admitting: Physical Medicine & Rehabilitation

## 2016-08-10 ENCOUNTER — Ambulatory Visit: Payer: Self-pay | Admitting: Neurology

## 2016-09-04 ENCOUNTER — Ambulatory Visit: Payer: Self-pay | Admitting: Neurology

## 2016-09-07 ENCOUNTER — Encounter: Payer: Self-pay | Admitting: Neurology

## 2016-09-11 DIAGNOSIS — I69291 Dysphagia following other nontraumatic intracranial hemorrhage: Secondary | ICD-10-CM | POA: Diagnosis not present

## 2016-09-11 DIAGNOSIS — I69292 Facial weakness following other nontraumatic intracranial hemorrhage: Secondary | ICD-10-CM | POA: Diagnosis not present

## 2016-09-11 DIAGNOSIS — I169 Hypertensive crisis, unspecified: Secondary | ICD-10-CM | POA: Diagnosis not present

## 2016-09-11 DIAGNOSIS — I10 Essential (primary) hypertension: Secondary | ICD-10-CM | POA: Diagnosis not present

## 2016-10-18 ENCOUNTER — Ambulatory Visit: Payer: Self-pay | Admitting: Neurology

## 2016-10-19 ENCOUNTER — Encounter: Payer: Self-pay | Admitting: Neurology

## 2016-10-31 ENCOUNTER — Emergency Department (HOSPITAL_COMMUNITY): Payer: Medicaid Other

## 2016-10-31 ENCOUNTER — Encounter (HOSPITAL_COMMUNITY): Payer: Self-pay

## 2016-10-31 ENCOUNTER — Inpatient Hospital Stay (HOSPITAL_COMMUNITY)
Admission: EM | Admit: 2016-10-31 | Discharge: 2016-11-06 | DRG: 871 | Disposition: A | Discharge status: Rehabilitation Facility | Payer: Medicaid Other | Attending: Nephrology | Admitting: Nephrology

## 2016-10-31 DIAGNOSIS — Z8679 Personal history of other diseases of the circulatory system: Secondary | ICD-10-CM | POA: Diagnosis not present

## 2016-10-31 DIAGNOSIS — G039 Meningitis, unspecified: Secondary | ICD-10-CM | POA: Diagnosis not present

## 2016-10-31 DIAGNOSIS — E878 Other disorders of electrolyte and fluid balance, not elsewhere classified: Secondary | ICD-10-CM | POA: Diagnosis present

## 2016-10-31 DIAGNOSIS — I69351 Hemiplegia and hemiparesis following cerebral infarction affecting right dominant side: Secondary | ICD-10-CM

## 2016-10-31 DIAGNOSIS — G8929 Other chronic pain: Secondary | ICD-10-CM | POA: Diagnosis present

## 2016-10-31 DIAGNOSIS — E1010 Type 1 diabetes mellitus with ketoacidosis without coma: Secondary | ICD-10-CM

## 2016-10-31 DIAGNOSIS — E1110 Type 2 diabetes mellitus with ketoacidosis without coma: Secondary | ICD-10-CM | POA: Diagnosis present

## 2016-10-31 DIAGNOSIS — F1410 Cocaine abuse, uncomplicated: Secondary | ICD-10-CM | POA: Diagnosis not present

## 2016-10-31 DIAGNOSIS — F17210 Nicotine dependence, cigarettes, uncomplicated: Secondary | ICD-10-CM | POA: Diagnosis present

## 2016-10-31 DIAGNOSIS — G0490 Encephalitis and encephalomyelitis, unspecified: Secondary | ICD-10-CM | POA: Diagnosis present

## 2016-10-31 DIAGNOSIS — R7309 Other abnormal glucose: Secondary | ICD-10-CM | POA: Diagnosis not present

## 2016-10-31 DIAGNOSIS — E1122 Type 2 diabetes mellitus with diabetic chronic kidney disease: Secondary | ICD-10-CM | POA: Diagnosis present

## 2016-10-31 DIAGNOSIS — M549 Dorsalgia, unspecified: Secondary | ICD-10-CM | POA: Diagnosis present

## 2016-10-31 DIAGNOSIS — K118 Other diseases of salivary glands: Secondary | ICD-10-CM | POA: Diagnosis not present

## 2016-10-31 DIAGNOSIS — Z794 Long term (current) use of insulin: Secondary | ICD-10-CM

## 2016-10-31 DIAGNOSIS — I639 Cerebral infarction, unspecified: Secondary | ICD-10-CM | POA: Diagnosis present

## 2016-10-31 DIAGNOSIS — R6521 Severe sepsis with septic shock: Secondary | ICD-10-CM | POA: Diagnosis present

## 2016-10-31 DIAGNOSIS — M792 Neuralgia and neuritis, unspecified: Secondary | ICD-10-CM | POA: Diagnosis not present

## 2016-10-31 DIAGNOSIS — E1142 Type 2 diabetes mellitus with diabetic polyneuropathy: Secondary | ICD-10-CM | POA: Diagnosis not present

## 2016-10-31 DIAGNOSIS — N183 Chronic kidney disease, stage 3 unspecified: Secondary | ICD-10-CM

## 2016-10-31 DIAGNOSIS — E1101 Type 2 diabetes mellitus with hyperosmolarity with coma: Secondary | ICD-10-CM

## 2016-10-31 DIAGNOSIS — A419 Sepsis, unspecified organism: Secondary | ICD-10-CM | POA: Diagnosis present

## 2016-10-31 DIAGNOSIS — E0910 Drug or chemical induced diabetes mellitus with ketoacidosis without coma: Secondary | ICD-10-CM | POA: Diagnosis not present

## 2016-10-31 DIAGNOSIS — R4182 Altered mental status, unspecified: Secondary | ICD-10-CM | POA: Diagnosis present

## 2016-10-31 DIAGNOSIS — F1910 Other psychoactive substance abuse, uncomplicated: Secondary | ICD-10-CM

## 2016-10-31 DIAGNOSIS — R269 Unspecified abnormalities of gait and mobility: Secondary | ICD-10-CM | POA: Diagnosis not present

## 2016-10-31 DIAGNOSIS — B182 Chronic viral hepatitis C: Secondary | ICD-10-CM | POA: Diagnosis not present

## 2016-10-31 DIAGNOSIS — B1920 Unspecified viral hepatitis C without hepatic coma: Secondary | ICD-10-CM | POA: Diagnosis present

## 2016-10-31 DIAGNOSIS — R159 Full incontinence of feces: Secondary | ICD-10-CM | POA: Diagnosis present

## 2016-10-31 DIAGNOSIS — F1010 Alcohol abuse, uncomplicated: Secondary | ICD-10-CM | POA: Diagnosis not present

## 2016-10-31 DIAGNOSIS — D37030 Neoplasm of uncertain behavior of the parotid salivary glands: Secondary | ICD-10-CM | POA: Diagnosis present

## 2016-10-31 DIAGNOSIS — R6520 Severe sepsis without septic shock: Secondary | ICD-10-CM | POA: Diagnosis not present

## 2016-10-31 DIAGNOSIS — R569 Unspecified convulsions: Secondary | ICD-10-CM | POA: Diagnosis not present

## 2016-10-31 DIAGNOSIS — E669 Obesity, unspecified: Secondary | ICD-10-CM | POA: Diagnosis present

## 2016-10-31 DIAGNOSIS — E1121 Type 2 diabetes mellitus with diabetic nephropathy: Secondary | ICD-10-CM | POA: Diagnosis present

## 2016-10-31 DIAGNOSIS — K7200 Acute and subacute hepatic failure without coma: Secondary | ICD-10-CM

## 2016-10-31 DIAGNOSIS — I613 Nontraumatic intracerebral hemorrhage in brain stem: Secondary | ICD-10-CM | POA: Diagnosis not present

## 2016-10-31 DIAGNOSIS — G9341 Metabolic encephalopathy: Secondary | ICD-10-CM | POA: Diagnosis present

## 2016-10-31 DIAGNOSIS — K219 Gastro-esophageal reflux disease without esophagitis: Secondary | ICD-10-CM | POA: Diagnosis present

## 2016-10-31 DIAGNOSIS — G009 Bacterial meningitis, unspecified: Secondary | ICD-10-CM | POA: Diagnosis present

## 2016-10-31 DIAGNOSIS — E7251 Non-ketotic hyperglycinemia: Secondary | ICD-10-CM | POA: Diagnosis not present

## 2016-10-31 DIAGNOSIS — G9340 Encephalopathy, unspecified: Secondary | ICD-10-CM | POA: Diagnosis not present

## 2016-10-31 DIAGNOSIS — Z79891 Long term (current) use of opiate analgesic: Secondary | ICD-10-CM

## 2016-10-31 DIAGNOSIS — E1011 Type 1 diabetes mellitus with ketoacidosis with coma: Secondary | ICD-10-CM | POA: Diagnosis not present

## 2016-10-31 DIAGNOSIS — R000 Tachycardia, unspecified: Secondary | ICD-10-CM | POA: Diagnosis present

## 2016-10-31 DIAGNOSIS — N179 Acute kidney failure, unspecified: Secondary | ICD-10-CM | POA: Diagnosis not present

## 2016-10-31 DIAGNOSIS — R32 Unspecified urinary incontinence: Secondary | ICD-10-CM | POA: Diagnosis present

## 2016-10-31 DIAGNOSIS — R197 Diarrhea, unspecified: Secondary | ICD-10-CM

## 2016-10-31 DIAGNOSIS — I129 Hypertensive chronic kidney disease with stage 1 through stage 4 chronic kidney disease, or unspecified chronic kidney disease: Secondary | ICD-10-CM | POA: Diagnosis present

## 2016-10-31 DIAGNOSIS — G319 Degenerative disease of nervous system, unspecified: Secondary | ICD-10-CM | POA: Diagnosis present

## 2016-10-31 DIAGNOSIS — N3941 Urge incontinence: Secondary | ICD-10-CM | POA: Diagnosis not present

## 2016-10-31 DIAGNOSIS — R410 Disorientation, unspecified: Secondary | ICD-10-CM

## 2016-10-31 DIAGNOSIS — E11649 Type 2 diabetes mellitus with hypoglycemia without coma: Secondary | ICD-10-CM | POA: Diagnosis not present

## 2016-10-31 DIAGNOSIS — Z8614 Personal history of Methicillin resistant Staphylococcus aureus infection: Secondary | ICD-10-CM

## 2016-10-31 DIAGNOSIS — G4700 Insomnia, unspecified: Secondary | ICD-10-CM | POA: Diagnosis present

## 2016-10-31 DIAGNOSIS — E1165 Type 2 diabetes mellitus with hyperglycemia: Secondary | ICD-10-CM | POA: Diagnosis not present

## 2016-10-31 DIAGNOSIS — E1100 Type 2 diabetes mellitus with hyperosmolarity without nonketotic hyperglycemic-hyperosmolar coma (NKHHC): Secondary | ICD-10-CM | POA: Diagnosis present

## 2016-10-31 DIAGNOSIS — N170 Acute kidney failure with tubular necrosis: Secondary | ICD-10-CM | POA: Diagnosis present

## 2016-10-31 DIAGNOSIS — E876 Hypokalemia: Secondary | ICD-10-CM

## 2016-10-31 DIAGNOSIS — E870 Hyperosmolality and hypernatremia: Secondary | ICD-10-CM | POA: Diagnosis present

## 2016-10-31 DIAGNOSIS — F329 Major depressive disorder, single episode, unspecified: Secondary | ICD-10-CM | POA: Diagnosis present

## 2016-10-31 DIAGNOSIS — B999 Unspecified infectious disease: Secondary | ICD-10-CM

## 2016-10-31 DIAGNOSIS — Z6834 Body mass index (BMI) 34.0-34.9, adult: Secondary | ICD-10-CM

## 2016-10-31 DIAGNOSIS — I10 Essential (primary) hypertension: Secondary | ICD-10-CM | POA: Diagnosis not present

## 2016-10-31 DIAGNOSIS — E1149 Type 2 diabetes mellitus with other diabetic neurological complication: Secondary | ICD-10-CM | POA: Diagnosis not present

## 2016-10-31 DIAGNOSIS — Z79899 Other long term (current) drug therapy: Secondary | ICD-10-CM

## 2016-10-31 DIAGNOSIS — F5101 Primary insomnia: Secondary | ICD-10-CM | POA: Diagnosis not present

## 2016-10-31 DIAGNOSIS — R4189 Other symptoms and signs involving cognitive functions and awareness: Secondary | ICD-10-CM | POA: Diagnosis not present

## 2016-10-31 DIAGNOSIS — I160 Hypertensive urgency: Secondary | ICD-10-CM | POA: Diagnosis present

## 2016-10-31 HISTORY — DX: Cocaine abuse, uncomplicated: F14.10

## 2016-10-31 HISTORY — DX: Insomnia, unspecified: G47.00

## 2016-10-31 LAB — BASIC METABOLIC PANEL
ANION GAP: 14 (ref 5–15)
Anion gap: 15 (ref 5–15)
BUN: 46 mg/dL — AB (ref 6–20)
BUN: 40 mg/dL — AB (ref 6–20)
CHLORIDE: 122 mmol/L — AB (ref 101–111)
CO2: 22 mmol/L (ref 22–32)
CO2: 19 mmol/L — ABNORMAL LOW (ref 22–32)
Calcium: 8.6 mg/dL — ABNORMAL LOW (ref 8.9–10.3)
Calcium: 8.3 mg/dL — ABNORMAL LOW (ref 8.9–10.3)
Chloride: 114 mmol/L — ABNORMAL HIGH (ref 101–111)
Creatinine, Ser: 3.12 mg/dL — ABNORMAL HIGH (ref 0.44–1.00)
Creatinine, Ser: 2.61 mg/dL — ABNORMAL HIGH (ref 0.44–1.00)
GFR calc Af Amer: 18 mL/min — ABNORMAL LOW (ref >60)
GFR calc Af Amer: 22 mL/min — ABNORMAL LOW (ref >60)
GFR calc non Af Amer: 15 mL/min — ABNORMAL LOW (ref >60)
GFR calc non Af Amer: 19 mL/min — ABNORMAL LOW (ref >60)
GLUCOSE: 682 mg/dL — AB (ref 65–99)
GLUCOSE: 337 mg/dL — AB (ref 65–99)
Potassium: 3.5 mmol/L (ref 3.5–5.1)
Potassium: 3 mmol/L — ABNORMAL LOW (ref 3.5–5.1)
SODIUM: 151 mmol/L — AB (ref 135–145)
Sodium: 155 mmol/L — ABNORMAL HIGH (ref 135–145)

## 2016-10-31 LAB — CBC WITH DIFFERENTIAL/PLATELET
BASOS PCT: 0 %
Basophils Absolute: 0 10*3/uL (ref 0.0–0.1)
EOS ABS: 0 10*3/uL (ref 0.0–0.7)
Eosinophils Relative: 0 %
HCT: 52.2 % — ABNORMAL HIGH (ref 36.0–46.0)
Hemoglobin: 16 g/dL — ABNORMAL HIGH (ref 12.0–15.0)
Lymphocytes Relative: 12 %
Lymphs Abs: 1.7 10*3/uL (ref 0.7–4.0)
MCH: 30.8 pg (ref 26.0–34.0)
MCHC: 30.7 g/dL (ref 30.0–36.0)
MCV: 100.6 fL — ABNORMAL HIGH (ref 78.0–100.0)
MONO ABS: 0.7 10*3/uL (ref 0.1–1.0)
MONOS PCT: 5 %
Neutro Abs: 11.8 10*3/uL — ABNORMAL HIGH (ref 1.7–7.7)
Neutrophils Relative %: 83 %
Platelets: 270 10*3/uL (ref 150–400)
RBC: 5.19 MIL/uL — ABNORMAL HIGH (ref 3.87–5.11)
RDW: 14.4 % (ref 11.5–15.5)
WBC: 14.2 10*3/uL — ABNORMAL HIGH (ref 4.0–10.5)

## 2016-10-31 LAB — I-STAT VENOUS BLOOD GAS, ED
ACID-BASE DEFICIT: 7 mmol/L — AB (ref 0.0–2.0)
BICARBONATE: 20.7 mmol/L (ref 20.0–28.0)
O2 SAT: 28 %
PH VEN: 7.254 (ref 7.250–7.430)
PO2 VEN: 21 mmHg — AB (ref 32.0–45.0)
TCO2: 22 mmol/L (ref 0–100)
pCO2, Ven: 46.7 mmHg (ref 44.0–60.0)

## 2016-10-31 LAB — GLUCOSE, CAPILLARY
GLUCOSE-CAPILLARY: 442 mg/dL — AB (ref 65–99)
Glucose-Capillary: 349 mg/dL — ABNORMAL HIGH (ref 65–99)
Glucose-Capillary: 417 mg/dL — ABNORMAL HIGH (ref 65–99)

## 2016-10-31 LAB — PROTIME-INR
INR: 1.09
Prothrombin Time: 14.1 seconds (ref 11.4–15.2)

## 2016-10-31 LAB — CBG MONITORING, ED
Glucose-Capillary: >600 mg/dL (ref 65–99)
Glucose-Capillary: >600 mg/dL (ref 65–99)

## 2016-10-31 LAB — PROCALCITONIN

## 2016-10-31 LAB — URINALYSIS, ROUTINE W REFLEX MICROSCOPIC
BILIRUBIN URINE: NEGATIVE
Ketones, ur: 5 mg/dL — AB
LEUKOCYTES UA: NEGATIVE
NITRITE: NEGATIVE
SPECIFIC GRAVITY, URINE: 1.024 (ref 1.005–1.030)
pH: 5 (ref 5.0–8.0)

## 2016-10-31 LAB — BETA-HYDROXYBUTYRIC ACID: Beta-Hydroxybutyric Acid: 1.62 mmol/L — ABNORMAL HIGH (ref 0.05–0.27)

## 2016-10-31 LAB — ETHANOL

## 2016-10-31 LAB — I-STAT CG4 LACTIC ACID, ED: LACTIC ACID, VENOUS: 4.65 mmol/L — AB (ref 0.5–1.9)

## 2016-10-31 LAB — COMPREHENSIVE METABOLIC PANEL
ALT: 16 U/L (ref 14–54)
AST: 20 U/L (ref 15–41)
Albumin: 3.4 g/dL — ABNORMAL LOW (ref 3.5–5.0)
Alkaline Phosphatase: 69 U/L (ref 38–126)
Anion gap: 25 — ABNORMAL HIGH (ref 5–15)
BILIRUBIN TOTAL: 1.4 mg/dL — AB (ref 0.3–1.2)
BUN: 52 mg/dL — AB (ref 6–20)
CO2: 18 mmol/L — ABNORMAL LOW (ref 22–32)
CREATININE: 3.77 mg/dL — AB (ref 0.44–1.00)
Calcium: 9.6 mg/dL (ref 8.9–10.3)
Chloride: 98 mmol/L — ABNORMAL LOW (ref 101–111)
GFR calc Af Amer: 14 mL/min — ABNORMAL LOW (ref >60)
GFR, EST NON AFRICAN AMERICAN: 12 mL/min — AB (ref >60)
Glucose, Bld: 1200 mg/dL (ref 65–99)
POTASSIUM: 5.1 mmol/L (ref 3.5–5.1)
Sodium: 141 mmol/L (ref 135–145)
TOTAL PROTEIN: 9 g/dL — AB (ref 6.5–8.1)

## 2016-10-31 LAB — RAPID URINE DRUG SCREEN, HOSP PERFORMED
Amphetamines: NOT DETECTED
BARBITURATES: NOT DETECTED
BENZODIAZEPINES: NOT DETECTED
Cocaine: POSITIVE — AB
Opiates: NOT DETECTED
TETRAHYDROCANNABINOL: NOT DETECTED

## 2016-10-31 LAB — LACTIC ACID, PLASMA
Lactic Acid, Venous: 5.3 mmol/L (ref 0.5–1.9)
Lactic Acid, Venous: 6.3 mmol/L (ref 0.5–1.9)

## 2016-10-31 MED ORDER — SODIUM CHLORIDE 0.9 % IV SOLN
INTRAVENOUS | Status: AC
  Administered 2016-10-31: 20:00:00 via INTRAVENOUS

## 2016-10-31 MED ORDER — METOPROLOL TARTRATE 5 MG/5ML IV SOLN: 5.0000 mg | INTRAVENOUS | Status: DC | PRN

## 2016-10-31 MED ORDER — SODIUM CHLORIDE 0.9 % IV BOLUS (SEPSIS)
1000.0000 mL | Freq: Once | INTRAVENOUS | Status: AC
  Administered 2016-10-31: 1000 mL via INTRAVENOUS

## 2016-10-31 MED ORDER — DEXTROSE-NACL 5-0.45 % IV SOLN
INTRAVENOUS | Status: DC
  Administered 2016-11-01: 1000 mL via INTRAVENOUS

## 2016-10-31 MED ORDER — SODIUM CHLORIDE 0.9 % IV SOLN
INTRAVENOUS | Status: DC
  Administered 2016-10-31: 5.4 [IU]/h via INTRAVENOUS
  Filled 2016-10-31: qty 2.5

## 2016-10-31 MED ORDER — DEXTROSE 5 % IV SOLN: 1.0000 g | INTRAVENOUS | Status: DC

## 2016-10-31 MED ORDER — HYDRALAZINE HCL 20 MG/ML IJ SOLN
20.0000 mg | INTRAMUSCULAR | Status: DC | PRN
  Administered 2016-11-01 – 2016-11-05 (×12): 20 mg via INTRAVENOUS
  Filled 2016-10-31 (×12): qty 1

## 2016-10-31 MED ORDER — HEPARIN SODIUM (PORCINE) 5000 UNIT/ML IJ SOLN
5000.0000 [IU] | Freq: Three times a day (TID) | INTRAMUSCULAR | Status: DC
  Administered 2016-10-31 – 2016-11-01 (×2): 5000 [IU] via SUBCUTANEOUS
  Filled 2016-10-31 (×3): qty 1

## 2016-10-31 MED ORDER — PIPERACILLIN-TAZOBACTAM 3.375 G IVPB 30 MIN
3.3750 g | Freq: Once | INTRAVENOUS | Status: AC
  Administered 2016-10-31: 3.375 g via INTRAVENOUS
  Filled 2016-10-31: qty 50

## 2016-10-31 MED ORDER — INSULIN REGULAR BOLUS VIA INFUSION
0.0000 [IU] | Freq: Three times a day (TID) | INTRAVENOUS | Status: DC
  Filled 2016-10-31: qty 10

## 2016-10-31 MED ORDER — SODIUM CHLORIDE 0.9 % IV BOLUS (SEPSIS): 1000.0000 mL | Freq: Once | INTRAVENOUS | Status: DC

## 2016-10-31 MED ORDER — DEXTROSE-NACL 5-0.45 % IV SOLN: INTRAVENOUS | Status: DC

## 2016-10-31 MED ORDER — VANCOMYCIN HCL IN DEXTROSE 1-5 GM/200ML-% IV SOLN
1000.0000 mg | INTRAVENOUS | Status: DC
Start: 2016-11-02 — End: 2016-11-01

## 2016-10-31 MED ORDER — SODIUM CHLORIDE 0.9 % IV SOLN: INTRAVENOUS | Status: DC

## 2016-10-31 MED ORDER — DEXTROSE 5 % IV SOLN
2.0000 g | Freq: Once | INTRAVENOUS | Status: AC
  Administered 2016-10-31: 2 g via INTRAVENOUS
  Filled 2016-10-31: qty 2

## 2016-10-31 MED ORDER — HYDRALAZINE HCL 20 MG/ML IJ SOLN: 10.0000 mg | Freq: Four times a day (QID) | INTRAMUSCULAR | Status: DC | PRN

## 2016-10-31 MED ORDER — SODIUM CHLORIDE 0.9 % IV SOLN
500.0000 mg | Freq: Two times a day (BID) | INTRAVENOUS | Status: DC
  Administered 2016-10-31 – 2016-11-03 (×6): 500 mg via INTRAVENOUS
  Filled 2016-10-31 (×6): qty 5

## 2016-10-31 MED ORDER — SODIUM CHLORIDE 0.9 % IV SOLN
INTRAVENOUS | Status: DC
  Administered 2016-10-31: 20:00:00 via INTRAVENOUS

## 2016-10-31 MED ORDER — DEXTROSE-NACL 5-0.45 % IV SOLN
INTRAVENOUS | Status: DC
Start: 2016-10-31 — End: 2016-11-01

## 2016-10-31 MED ORDER — VANCOMYCIN HCL 10 G IV SOLR
2000.0000 mg | Freq: Once | INTRAVENOUS | Status: AC
  Administered 2016-10-31: 2000 mg via INTRAVENOUS
  Filled 2016-10-31: qty 2000

## 2016-10-31 MED ORDER — PIPERACILLIN-TAZOBACTAM 3.375 G IVPB
3.3750 g | Freq: Three times a day (TID) | INTRAVENOUS | Status: DC
  Administered 2016-11-01 – 2016-11-02 (×6): 3.375 g via INTRAVENOUS
  Filled 2016-10-31 (×7): qty 50

## 2016-10-31 MED ORDER — INSULIN REGULAR HUMAN 100 UNIT/ML IJ SOLN
INTRAMUSCULAR | Status: DC
  Administered 2016-10-31: 21.6 [IU]/h via INTRAVENOUS
  Filled 2016-10-31: qty 2.5

## 2016-10-31 MED ORDER — METOPROLOL TARTRATE 5 MG/5ML IV SOLN
5.0000 mg | INTRAVENOUS | Status: DC | PRN
  Administered 2016-10-31: 5 mg via INTRAVENOUS
  Filled 2016-10-31: qty 5

## 2016-10-31 MED ORDER — HYDRALAZINE HCL 20 MG/ML IJ SOLN
10.0000 mg | Freq: Four times a day (QID) | INTRAMUSCULAR | Status: DC | PRN
  Administered 2016-10-31: 10 mg via INTRAVENOUS
  Filled 2016-10-31: qty 1

## 2016-10-31 MED ORDER — SODIUM CHLORIDE 0.9 % IV SOLN
INTRAVENOUS | Status: DC
  Administered 2016-10-31: 16.2 [IU]/h via INTRAVENOUS
  Filled 2016-10-31: qty 2.5

## 2016-10-31 MED ORDER — DEXTROSE 50 % IV SOLN: 25.0000 mL | INTRAVENOUS | Status: DC | PRN

## 2016-10-31 MED ORDER — PANTOPRAZOLE SODIUM 40 MG IV SOLR
40.0000 mg | INTRAVENOUS | Status: DC
  Administered 2016-10-31 – 2016-11-02 (×3): 40 mg via INTRAVENOUS
  Filled 2016-10-31 (×3): qty 40

## 2016-10-31 NOTE — ED Provider Notes (Signed)
[facility] DEPT Provider Note   CSN: [id number] Arrival date & time: 10/31/16  1441     History   Chief Complaint Chief Complaint  Patient presents with  . Altered Mental Status    HPI [name] is a 61 y.o. female.  The history is provided by the patient.  Altered Mental Status   This is a new problem. The current episode started 6 to 12 hours ago. The problem has not changed since onset.Associated symptoms include confusion, somnolence and unresponsiveness. Her past medical history is significant for diabetes.   Remainder of history, ROS, and physical exam limited due to patient's condition (AMS). Additional information was obtained from EMS Level [facility].   Past Medical History:  Diagnosis Date  . Diabetes mellitus    Hb A1C = 12.6 on 05/15/11, managed on Novolog 70/30, 35 U qam, 25 U qpm  . Hypertension    poorly controlled  . Shortness of breath     Patient Active Problem List   Diagnosis Date Noted  . Right hemiparesis ([place]) 06/09/2016  . 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 Cytotoxic brain edema ([place]) 06/08/2016  . ICH (intracerebral hemorrhage) ([place]) 06/06/2016  . Chest pain, musculoskeletal 05/16/2011  . Vaginal candidiasis 05/16/2011  . Hypertension 05/16/2011  . Diabetes mellitus, type 2 ([place]) 05/16/2011  . CHEST PAIN 10/01/2009  . CELLULITIS AND ABSCESS OF LEG EXCEPT FOOT 08/31/2009  . INSOMNIA 07/13/2008  . MRSA 04/17/2008  . NECK MASS 04/17/2008  . COCAINE ABUSE 11/01/2007  . OBESITY NOS 07/14/2006  . DENTAL CARIES 07/14/2006  . DIABETES MELLITUS, TYPE II 04/26/2006  . ALCOHOL ABUSE 04/26/2006  . TOBACCO ABUSE 04/26/2006  . DEPRESSION 04/26/2006  . Essential hypertension 04/26/2006  . GERD 04/26/2006  . LOW BACK PAIN 04/26/2006    Past Surgical History:  Procedure Laterality Date  . ANKLE FRACTURE SURGERY  2007    OB History    No data available       Home Medications    Prior to Admission medications   Medication  Sig Start Date End Date Taking? Authorizing Provider  acyclovir (ZOVIRAX) 400 MG tablet Take 1 tablet (400 mg total) by mouth 4 (four) times daily. 06/20/16   Lavon Paganini Angiulli, PA-C  amLODipine (NORVASC) 10 MG tablet Take 1 tablet (10 mg total) by mouth daily. 06/20/16   Lavon Paganini Angiulli, PA-C  cyclobenzaprine (FLEXERIL) 10 MG tablet Take 1 tablet (10 mg total) by mouth 2 (two) times daily as needed for muscle spasms. 06/21/16   Lavon Paganini Angiulli, PA-C  folic acid (FOLVITE) 1 MG tablet Take 1 tablet (1 mg total) by mouth daily. 06/20/16   Lavon Paganini Angiulli, PA-C  gabapentin (NEURONTIN) 300 MG capsule Take 2 capsules (600 mg total) by mouth 4 (four) times daily. 06/21/16   Lavon Paganini Angiulli, PA-C  guaiFENesin (MUCINEX) 600 MG 12 hr tablet Take 1 tablet (600 mg total) by mouth 2 (two) times daily. 06/20/16   Lavon Paganini Angiulli, PA-C  hydrochlorothiazide (MICROZIDE) 12.5 MG capsule Take 1 capsule (12.5 mg total) by mouth daily. 06/21/16   Lavon Paganini Angiulli, PA-C  insulin aspart protamine- aspart (NOVOLOG MIX 70/30) (70-30) 100 UNIT/ML injection Inject 0.33 mLs (33 Units total) into the skin daily with supper. 06/20/16   Lavon Paganini Angiulli, PA-C  insulin aspart protamine- aspart (NOVOLOG MIX 70/30) (70-30) 100 UNIT/ML injection Inject 0.53 mLs (53 Units total) into the skin daily with breakfast. 06/20/16   Lavon Paganini Angiulli, PA-C  levETIRAcetam (KEPPRA) 1000 MG  tablet Take 1 tablet (1,000 mg total) by mouth every 12 (twelve) hours. 06/20/16   Lavon Paganini Angiulli, PA-C  lisinopril (PRINIVIL,ZESTRIL) 10 MG tablet Take 1 tablet (10 mg total) by mouth daily. 06/21/16   Daniel J Angiulli, PA-C  Maltodextrin-Xanthan Gum (RESOURCE THICKENUP CLEAR) POWD Take 360 g by mouth as needed. 06/20/16   Lavon Paganini Angiulli, PA-C  metoprolol tartrate (LOPRESSOR) 25 MG tablet Take 1 tablet (25 mg total) by mouth 2 (two) times daily. 06/20/16   Lavon Paganini Angiulli, PA-C  Multiple Vitamin (MULTIVITAMIN WITH MINERALS) TABS tablet  Take 1 tablet by mouth daily. 06/20/16   Lavon Paganini Angiulli, PA-C  nicotine (NICODERM CQ - DOSED IN MG/24 HOURS) 14 mg/24hr patch 14 mg patch daily 2 weeks then 7 mg patch daily 3 weeks and stop 06/20/16   Lavon Paganini Angiulli, PA-C  pantoprazole (PROTONIX) 40 MG tablet Take 1 tablet (40 mg total) by mouth daily. 06/20/16   Lavon Paganini Angiulli, PA-C  simvastatin (ZOCOR) 10 MG tablet Take 1 tablet (10 mg total) by mouth at bedtime. 06/21/16   Lavon Paganini Angiulli, PA-C  traMADol (ULTRAM) 50 MG tablet Take 1 tablet (50 mg total) by mouth daily as needed (for pain). 06/20/16   Lavon Paganini Angiulli, PA-C  traZODone (DESYREL) 100 MG tablet Take 1 tablet (100 mg total) by mouth at bedtime. 06/21/16   Lavon Paganini Angiulli, PA-C    Family History No family history on file.  Social History Social History  Substance Use Topics  . Smoking status: Current Every Day Smoker    Packs/day: 0.50    Years: 25.00  . Smokeless tobacco: Never Used  . Alcohol use 7.2 oz/week    12 Cans of beer per week     Allergies   Patient has no known allergies.   Review of Systems Review of Systems  Unable to perform ROS: Mental status change  Psychiatric/Behavioral: Positive for confusion.     Physical Exam Updated Vital Signs BP (!) 184/98 (BP Location: Left Arm)   Pulse (!) 126   Temp (!) 101.8 F (38.8 C) (Rectal)   Resp 16   SpO2 99%   Physical Exam  Constitutional: She appears well-developed and well-nourished. No distress.  HENT:  Head: Normocephalic and atraumatic.  Nose: Nose normal.  Eyes: Conjunctivae and EOM are normal. Pupils are equal, round, and reactive to light. Right eye exhibits no discharge. Left eye exhibits no discharge. No scleral icterus.  Neck: Normal range of motion. Neck supple.  Cardiovascular: Normal rate and regular rhythm.  Exam reveals no gallop and no friction rub.   No murmur heard. Pulmonary/Chest: Effort normal and breath sounds normal. No stridor. No respiratory distress.  She has no rales.  Abdominal: Soft. She exhibits no distension. There is no tenderness.  Musculoskeletal: She exhibits no edema or tenderness.  Neurological: She is alert. GCS eye subscore is 4. GCS verbal subscore is 1. GCS motor subscore is 5.  nonverbal  Skin: Skin is warm and dry. No rash noted. She is not diaphoretic. No erythema.  Psychiatric: She has a normal mood and affect.  Vitals reviewed.    ED Treatments / Results  Labs (all labs ordered are listed, but only abnormal results are displayed) Labs Reviewed  COMPREHENSIVE METABOLIC PANEL - Abnormal; Notable for the following:       Result Value   Chloride 98 (*)    CO2 18 (*)    Glucose, Bld 1,200 (*)    BUN 52 (*)  Creatinine, Ser 3.77 (*)    Total Protein 9.0 (*)    Albumin 3.4 (*)    Total Bilirubin 1.4 (*)    GFR calc non Af Amer 12 (*)    GFR calc Af Amer 14 (*)    Anion gap 25 (*)    All other components within normal limits  CBC WITH DIFFERENTIAL/PLATELET - Abnormal; Notable for the following:    WBC 14.2 (*)    RBC 5.19 (*)    Hemoglobin 16.0 (*)    HCT 52.2 (*)    MCV 100.6 (*)    Neutro Abs 11.8 (*)    All other components within normal limits  URINALYSIS, ROUTINE W REFLEX MICROSCOPIC - Abnormal; Notable for the following:    Glucose, UA >=500 (*)    Hgb urine dipstick MODERATE (*)    Ketones, ur 5 (*)    Protein, ur >=300 (*)    Bacteria, UA RARE (*)    Squamous Epithelial / LPF 0-5 (*)    All other components within normal limits  CBG MONITORING, ED - Abnormal; Notable for the following:    Glucose-Capillary >600 (*)    All other components within normal limits  I-STAT CG4 LACTIC ACID, ED - Abnormal; Notable for the following:    Lactic Acid, Venous 4.65 (*)    All other components within normal limits  I-STAT VENOUS BLOOD GAS, ED - Abnormal; Notable for the following:    pO2, Ven 21.0 (*)    Acid-base deficit 7.0 (*)    All other components within normal limits  CBG MONITORING, ED -  Abnormal; Notable for the following:    Glucose-Capillary >600 (*)    All other components within normal limits  CBG MONITORING, ED - Abnormal; Notable for the following:    Glucose-Capillary >600 (*)    All other components within normal limits  CBG MONITORING, ED - Abnormal; Notable for the following:    Glucose-Capillary >600 (*)    All other components within normal limits  CULTURE, BLOOD (ROUTINE X 2)  CULTURE, BLOOD (ROUTINE X 2)  URINE CULTURE  PROTIME-INR  BETA-HYDROXYBUTYRIC ACID  RAPID URINE DRUG SCREEN, HOSP PERFORMED  ETHANOL  PROCALCITONIN  PROCALCITONIN  BASIC METABOLIC PANEL  CBC  MAGNESIUM  PHOSPHORUS  HIV ANTIBODY (ROUTINE TESTING)  BASIC METABOLIC PANEL  BASIC METABOLIC PANEL  BASIC METABOLIC PANEL  LACTIC ACID, PLASMA  LACTIC ACID, PLASMA    EKG  EKG Interpretation  Date/Time:  Tuesday October 31 2016 14:50:13 EDT Ventricular Rate:  125 PR Interval:    QRS Duration: 89 QT Interval:  323 QTC Calculation: 468 R Axis:   31 Text Interpretation:  Sinus tachycardia Consider right atrial enlargement Probable LVH with secondary repol abnrm Otherwise no significant change Reconfirmed by [facility] MD, Verlin Uher ([id number]) on 10/31/2016 2:57:39 PM       Radiology Ct Head Wo Contrast  Result Date: 10/31/2016 CLINICAL DATA:  Altered mental status, incontinent, remote strokes, diabetes, hypertension EXAM: CT HEAD WITHOUT CONTRAST TECHNIQUE: Contiguous axial images were obtained from the base of the skull through the vertex without intravenous contrast. COMPARISON:  06/07/2016, 06/06/2016 FINDINGS: Brain: Stable brain atrophy and chronic white matter microvascular ischemic changes throughout the cerebral hemispheres. No acute intracranial hemorrhage, definite infarction, mass lesion, midline shift, herniation, hydrocephalus, or extra-axial fluid collection. Cisterns remain patent. No cerebellar abnormality. 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 Vascular:  Atherosclerosis of the intracranial vessels at the skullbase. Skull: Normal. Negative for fracture or focal lesion. Sinuses/Orbits:  No acute finding. Other: None. IMPRESSION: Brain atrophy and chronic white matter microvascular changes. No acute intracranial abnormality by noncontrast CT. Resolved remote left pontine brainstem hemorrhage compare to 06/07/2016. Electronically Signed   By: Jerilynn Mages.  Shick M.D.   On: 10/31/2016 17:10   Dg Chest Port 1 View  Result Date: 10/31/2016 CLINICAL DATA:  Altered mental status today. History of hypertension and diabetes. Shortness of breath. History of tobacco and cocaine abuse. EXAM: PORTABLE CHEST 1 VIEW COMPARISON:  06/10/2016. FINDINGS: Normal cardiomediastinal silhouette for the degree of inspiration. Slight bibasilar subsegmental atelectasis and mild vascular congestion. No active infiltrates or failure. No effusion or pneumothorax. Bones unremarkable. IMPRESSION: Stable chest radiograph.  No overt failure or consolidation. Electronically Signed   By: Staci Righter M.D.   On: 10/31/2016 16:06    Procedures Procedures (including critical care time) CRITICAL CARE Performed by: Grayce Sessions Merlinda Wrubel Total critical care time: 45 minutes Critical care time was exclusive of separately billable procedures and treating other patients. Critical care was necessary to treat or prevent imminent or life-threatening deterioration. Critical care was time spent personally by me on the following activities: development of treatment plan with patient and/or surrogate as well as nursing, discussions with consultants, evaluation of patient's response to treatment, examination of patient, obtaining history from patient or surrogate, ordering and performing treatments and interventions, ordering and review of laboratory studies, ordering and review of radiographic studies, pulse oximetry and re-evaluation of patient's condition.   Medications Ordered in ED Medications - No data to  display   Initial Impression / Assessment and Plan / ED Course  I have reviewed the triage vital signs and the nursing notes.  Pertinent labs & imaging results that were available during my care of the patient were reviewed by me and considered in my medical decision making (see chart for details).  Clinical Course as of Nov 01 2007  Tue Oct 31, 2016  1537 AMS. Febrile. Code sepsis initiated. Lactic >4. Emperic Abx started. 30cc/kg of IVF give for septic shock.  [PC]  [id number] Patient also noted to have HHS with glucose of 1200. Potassium within normal limits. We'll start on insulin gtt.  [PC]  [id number] Workup did not reveal source of infection. Antibiotics were broadened. Patient will be admitted to the hospitalist service for continued workup and management.  [PC]    Clinical Course User Index [PC] Fatima Blank, MD      Final Clinical Impressions(s) / ED Diagnoses   Final diagnoses:  Disorientation  Septic shock ([place])  Type 2 diabetes mellitus with hyperosmolar nonketotic hyperglycemia ([place])      Fatima Blank, MD 10/31/16 2014

## 2016-10-31 NOTE — ED Notes (Signed)
Pt CBG was HI, notified [place](RN)

## 2016-10-31 NOTE — Consult Note (Signed)
Name: [name] MRN: [id number] DOB: 1956-06-07    ADMISSION DATE:  10/31/2016 CONSULTATION DATE:  10/31/2016  REFERRING MD :  Dr. Sloan Leiter   CHIEF COMPLAINT:  DKA  HISTORY OF PRESENT ILLNESS: Information obtained from medical record as patient is encephalopathic  61 year old female with PMH of DM, HTN, chronic back pain, tobacco/cocaine abuse, CKD III, and recent ICH (right residual hemiparesis). Presents to ED on 4/24 after being found at home with altered mental status. Per report patient typically takes care of husband and today when home health nurse came to the house, patient was found on the couch with multiple episodes of incontinence. Upon arrival to the ED patient was non-verbal and unable to follow commands. WBC 14.2, Lactic Acid 4.65, Glucose 1200, and pH 7.2. PCCM was asked to consult.   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   SIGNIFICANT EVENTS  4/24 > Presents to ED, DKA  STUDIES:  CXR 4/24 > Slight bibasilar subsegmental atelectasis and mild vascular congestion  CT Head 4/24 > Brain atrophy and chronic white matter microvascular changes, no acute  CT AP 4/24 > Descending and sigmoid diverticulosis without acute diverticulitis, bowel inflammation or obstruction, punctate non-obstructing calcifications associated with interpolar and lower pole of the right   MICROBIOLOGY: Blood 4/24 >> Urine 4/24 >>  UDS 4/24 >>   PAST MEDICAL HISTORY :   has a past medical history of Diabetes mellitus; Hypertension; and Shortness of breath.  has a past surgical history that includes Ankle fracture surgery (2007). Prior to Admission medications   Medication Sig Start Date End Date Taking? Authorizing Provider  acyclovir (ZOVIRAX) 400 MG tablet Take 1 tablet (400 mg total) by mouth 4 (four) times daily. 06/20/16   Lavon Paganini Angiulli, PA-C  amLODipine (NORVASC) 10 MG tablet Take 1 tablet (10 mg total) by mouth daily.  06/20/16   Lavon Paganini Angiulli, PA-C  cyclobenzaprine (FLEXERIL) 10 MG tablet Take 1 tablet (10 mg total) by mouth 2 (two) times daily as needed for muscle spasms. 06/21/16   Lavon Paganini Angiulli, PA-C  folic acid (FOLVITE) 1 MG tablet Take 1 tablet (1 mg total) by mouth daily. 06/20/16   Lavon Paganini Angiulli, PA-C  gabapentin (NEURONTIN) 300 MG capsule Take 2 capsules (600 mg total) by mouth 4 (four) times daily. 06/21/16   Lavon Paganini Angiulli, PA-C  guaiFENesin (MUCINEX) 600 MG 12 hr tablet Take 1 tablet (600 mg total) by mouth 2 (two) times daily. 06/20/16   Lavon Paganini Angiulli, PA-C  hydrochlorothiazide (MICROZIDE) 12.5 MG capsule Take 1 capsule (12.5 mg total) by mouth daily. 06/21/16   Lavon Paganini Angiulli, PA-C  insulin aspart protamine- aspart (NOVOLOG MIX 70/30) (70-30) 100 UNIT/ML injection Inject 0.33 mLs (33 Units total) into the skin daily with supper. 06/20/16   Lavon Paganini Angiulli, PA-C  insulin aspart protamine- aspart (NOVOLOG MIX 70/30) (70-30) 100 UNIT/ML injection Inject 0.53 mLs (53 Units total) into the skin daily with breakfast. 06/20/16   Lavon Paganini Angiulli, PA-C  levETIRAcetam (KEPPRA) 1000 MG tablet Take 1 tablet (1,000 mg total) by mouth every 12 (twelve) hours. 06/20/16   Lavon Paganini Angiulli, PA-C  lisinopril (PRINIVIL,ZESTRIL) 10 MG tablet Take 1 tablet (10 mg total) by mouth daily. 06/21/16   Daniel J Angiulli, PA-C  Maltodextrin-Xanthan Gum (RESOURCE THICKENUP CLEAR) POWD Take 360 g by mouth as needed. 06/20/16   Lavon Paganini Angiulli, PA-C  metoprolol tartrate (LOPRESSOR) 25 MG tablet Take 1 tablet (  25 mg total) by mouth 2 (two) times daily. 06/20/16   Lavon Paganini Angiulli, PA-C  Multiple Vitamin (MULTIVITAMIN WITH MINERALS) TABS tablet Take 1 tablet by mouth daily. 06/20/16   Lavon Paganini Angiulli, PA-C  nicotine (NICODERM CQ - DOSED IN MG/24 HOURS) 14 mg/24hr patch 14 mg patch daily 2 weeks then 7 mg patch daily 3 weeks and stop 06/20/16   Lavon Paganini Angiulli, PA-C  pantoprazole (PROTONIX) 40 MG  tablet Take 1 tablet (40 mg total) by mouth daily. 06/20/16   Lavon Paganini Angiulli, PA-C  simvastatin (ZOCOR) 10 MG tablet Take 1 tablet (10 mg total) by mouth at bedtime. 06/21/16   Lavon Paganini Angiulli, PA-C  traMADol (ULTRAM) 50 MG tablet Take 1 tablet (50 mg total) by mouth daily as needed (for pain). 06/20/16   Lavon Paganini Angiulli, PA-C  traZODone (DESYREL) 100 MG tablet Take 1 tablet (100 mg total) by mouth at bedtime. 06/21/16   Lavon Paganini Angiulli, PA-C   No Known Allergies  FAMILY HISTORY:  family history is not on file. SOCIAL HISTORY:  reports that she has been smoking.  She has a 12.50 pack-year smoking history. She has never used smokeless tobacco. She reports that she drinks about 7.2 oz of alcohol per week . She reports that she uses drugs, including Cocaine.  REVIEW OF SYSTEMS:   Unable to review as patient is encephalopathic   SUBJECTIVE:  Remains on room air. Starting to wake up post-fluid.   VITAL SIGNS: Temp:  [101.8 F (38.8 C)] 101.8 F (38.8 C) (04/24 1514) Pulse Rate:  [121-126] 121 (04/24 1730) Resp:  [15-28] 19 (04/24 1730) BP: (126-193)/(87-114) 189/91 (04/24 1730) SpO2:  [94 %-99 %] 96 % (04/24 1730) Weight:  [102.1 kg (225 lb)] 102.1 kg (225 lb) (04/24 1702)  PHYSICAL EXAMINATION: General:  Adult female, no distress  Neuro:  Lethargic, arouses to verbal and physical stimulation  HEENT:  Dry lips and mouth  Cardiovascular:  Tachy, no MRG, NI S1/S2 Lungs:  Clear breath sounds, no wheeze/crackles  Abdomen:  Obese, active bowel sounds  Musculoskeletal:  No acute  Skin:  Warm, dry, intact    Recent Labs Lab 10/31/16 1513  NA 141  K 5.1  CL 98*  CO2 18*  BUN 52*  CREATININE 3.77*  GLUCOSE 1,200*    Recent Labs Lab 10/31/16 1513  HGB 16.0*  HCT 52.2*  WBC 14.2*  PLT 270   Ct Abdomen Pelvis Wo Contrast  Result Date: 10/31/2016 CLINICAL DATA:  Sepsis. Patient unable to tolerate oral contrast. Multiple episodes of incontinence. EXAM: CT ABDOMEN  AND PELVIS WITHOUT CONTRAST TECHNIQUE: Multidetector CT imaging of the abdomen and pelvis was performed following the standard protocol without IV contrast. COMPARISON:  06/21/2008 FINDINGS: Lower chest: Atelectasis and/or scarring in both lower lobes and right middle lobe. Normal visualized cardiac chambers size without pericardial effusion. Aortic atherosclerosis along the descending thoracic aorta. Small hiatal hernia. Hepatobiliary: No focal liver abnormality is seen. No gallstones, gallbladder wall thickening, or biliary dilatation. Pancreas: Unremarkable. No pancreatic ductal dilatation or surrounding inflammatory changes. Spleen: Normal in size without focal abnormality. Adrenals/Urinary Tract: Normal bilateral adrenal glands. No obstructive uropathy. Renovascular calcifications are noted of the right renal hilum. Punctate 2 mm calcifications in the interpolar and lower pole of the right kidney may either represent nonobstructing renal calculi or vascular calcifications. No hydroureteronephrosis. Urinary bladder is unremarkable. Stomach/Bowel: Stomach is within normal limits. Appendix appears normal. No evidence of bowel wall thickening, distention, or inflammatory changes. Descending and  sigmoid diverticulosis is again noted. No evidence of acute diverticulitis. Vascular/Lymphatic: Aortic and bi-iliac-femoral atherosclerosis. No enlarged abdominal or pelvic lymph nodes. Reproductive: Uterus and bilateral adnexa are unremarkable. Other: No abdominal wall hernia or abnormality. No abdominopelvic ascites. Musculoskeletal: Mild vacuum disc phenomena L3-4 and L4-5. No acute nor suspicious osseous abnormalities. Mild levoconvex curvature of the lumbar spine possibly positional. Slight joint space narrowing of both hips. Sclerosis of the pubic symphysis likely degenerative. IMPRESSION: 1. Descending and sigmoid diverticulosis without acute diverticulitis, bowel inflammation or obstruction. 2. Punctate  nonobstructing calcifications associated with the interpolar and lower pole of the right kidney. Nephrolithiasis versus vascular calcifications. 3. No acute intraabdominal nor pelvic abnormality. Electronically Signed   By: Ashley Royalty M.D.   On: 10/31/2016 18:41   Ct Head Wo Contrast  Result Date: 10/31/2016 CLINICAL DATA:  Altered mental status, incontinent, remote strokes, diabetes, hypertension EXAM: CT HEAD WITHOUT CONTRAST TECHNIQUE: Contiguous axial images were obtained from the base of the skull through the vertex without intravenous contrast. COMPARISON:  06/07/2016, 06/06/2016 FINDINGS: Brain: Stable brain atrophy and chronic white matter microvascular ischemic changes throughout the cerebral hemispheres. No acute intracranial hemorrhage, definite infarction, mass lesion, midline shift, herniation, hydrocephalus, or extra-axial fluid collection. Cisterns remain patent. No cerebellar abnormality. 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 Vascular: Atherosclerosis of the intracranial vessels at the skullbase. Skull: Normal. Negative for fracture or focal lesion. Sinuses/Orbits: No acute finding. Other: None. IMPRESSION: Brain atrophy and chronic white matter microvascular changes. No acute intracranial abnormality by noncontrast CT. Resolved remote left pontine brainstem hemorrhage compare to 06/07/2016. Electronically Signed   By: Jerilynn Mages.  Shick M.D.   On: 10/31/2016 17:10   Dg Chest Port 1 View  Result Date: 10/31/2016 CLINICAL DATA:  Altered mental status today. History of hypertension and diabetes. Shortness of breath. History of tobacco and cocaine abuse. EXAM: PORTABLE CHEST 1 VIEW COMPARISON:  06/10/2016. FINDINGS: Normal cardiomediastinal silhouette for the degree of inspiration. Slight bibasilar subsegmental atelectasis and mild vascular congestion. No active infiltrates or failure. No effusion or pneumothorax. Bones unremarkable. IMPRESSION: Stable chest radiograph.  No overt  failure or consolidation. Electronically Signed   By: Staci Righter M.D.   On: 10/31/2016 16:06    ASSESSMENT / PLAN:  Hyperosmolar nonketotic state due to severe hyperglycemia vs DKA -Beta-Hydroxybutyric acid pending, ketones 5, anion gap 25  H/O Non-complaint DM Plan -Insulin gtt -Trend BMP -Hydrate s/p 3L NS > Will bolus 1L now   Lactic Acidosis  Acute on Chronic Renal Failure  Plan  -Trend Lactic Acid  -Trend BMP -NS @ 125 ml/hr  -Replace electrolytes as needed   Metabolic Encephalopathy in setting of metabolic acidosis vs seizures  -CT Head Negative  Left Facial/Left Arm twitching H/O ICH with right hemiparesis, Cocaine abuse, Depression  Plan  -EEG pending  -UDS pending  -ETOH pending  -Neurology consulted  -Hold sedating medications   Tachycardia - in setting of cocaine use vs sepsis vs dehydration  H/O HTN Plan  -Cardiac Monitoring  -Hold home Norvasc, Lopressor (UDS + Cocaine in December), Lisinopril  -Hydralazine PRN for Systolic >491  Sepsis  Plan  -Follow culture data -Trend WBC and Fever curve -Continue Zosyn and Vancomycin   GERD  Plan -PPI  CC Time: 45 minutes   Hayden Pedro, AG-ACNP [facility]  Pgr: [phone]  PCCM Pgr: [phone]    ATTENDING NOTE / ATTESTATION NOTE :   I have discussed the case with the resident/APP  Hayden Pedro NP  I agree with the resident/APP's  history, physical examination, assessment, and plans.    I have edited the above note and modified it according to our agreed history, physical examination, assessment and plan.   Briefly, 61 year old female with PMH of DM, HTN, chronic back pain, tobacco/cocaine abuse, CKD III, and recent ICH (right residual hemiparesis). Presents to ED on 4/24 after being found at home with altered mental status. Per report patient typically takes care of husband and today when home health nurse came to the house, patient was found on the couch with  multiple episodes of incontinence. Upon arrival to the ED patient was non-verbal and unable to follow commands. WBC 14.2, Lactic Acid 4.65, Glucose 1200, and pH 7.2. PCCM was asked to consult.   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  Patient was treated as a DKA at the emergency room. She was volume resuscitated as well as started on insulin drip. Blood pressure remained high for which she received when necessary medicines. She was transferred to the ICU.  Patient also presented with episodic rhythmic movement of left upper extremity.  Vitals:  Vitals:   10/31/16 1945 10/31/16 2000 10/31/16 2015 10/31/16 2200  BP: (!) 174/99 (!) 175/111 (!) 149/93 (!) 173/93  Pulse: (!) 128 (!) 129 (!) 129 (!) 120  Resp: 20 15 (!) 23 (!) 8  Temp:      TempSrc:      SpO2: 93% 93% 94% 96%  Weight:      Height:        Constitutional/General: well-nourished, well-developed, Drowsy, grimaces to deep sternal rub. Did not follow commands. Protecting airway. During my examination, she had episodic rhythmic movement of her left upper extremity.  Body mass index is 34.21 kg/m. Wt Readings from Last 3 Encounters:  10/31/16 102.1 kg (225 lb)  06/14/16 94.2 kg (207 lb 10.8 oz)  06/07/16 93.4 kg (205 lb 14.6 oz)    HEENT: PERLA, anicteric sclerae. (-) Oral thrush  Neck: No masses. Midline trachea. No JVD, (-) LAD. (-) bruits appreciated.  Respiratory/Chest: Grossly normal chest. (-) deformity. (-) Accessory muscle use.  Symmetric expansion. Diminished BS on both lower lung zones. (-) wheezing, crackles, rhonchi (-) egophony  Cardiovascular: Regular rate and  rhythm, heart sounds normal, no murmur or gallops,  Trace peripheral edema  Gastrointestinal:  Normal bowel sounds. Soft, non-tender. No hepatosplenomegaly.  (-) masses.   Musculoskeletal:  Unable to assess  Extremities: Grossly normal. (-) clubbing, cyanosis.  Trace   edema  Skin: (-) rash,lesions seen.   Neurological/Psychiatric : 3 mm pupils B and reactive.  (-) facial asymmetry. Grimaces to deep sternal rub. Supple neck.     CBC Recent Labs     10/31/16  1513  WBC  14.2*  HGB  16.0*  HCT  52.2*  PLT  270    Coag's Recent Labs     10/31/16  1513  INR  1.09    BMET Recent Labs     10/31/16  1513  10/31/16  1956  NA  141  151*  K  5.1  3.5  CL  98*  114*  CO2  18*  22  BUN  52*  46*  CREATININE  3.77*  3.12*  GLUCOSE  1,200*  682*    Electrolytes Recent Labs     10/31/16  1513  10/31/16  1956  CALCIUM  9.6  8.6*    Sepsis Markers Recent Labs  10/31/16  1956  PROCALCITON  <0.10    ABG No results for input(s): PHART, PCO2ART, PO2ART in the last 72 hours.  Liver Enzymes Recent Labs     10/31/16  1513  AST  20  ALT  16  ALKPHOS  69  BILITOT  1.4*  ALBUMIN  3.4*    Cardiac Enzymes No results for input(s): TROPONINI, PROBNP in the last 72 hours.  Glucose Recent Labs     10/31/16  1456  10/31/16  1703  10/31/16  1826  10/31/16  1934  10/31/16  2039  GLUCAP  >600*  >600*  >600*  >600*  >600*    Imaging Ct Abdomen Pelvis Wo Contrast  Result Date: 10/31/2016 CLINICAL DATA:  Sepsis. Patient unable to tolerate oral contrast. Multiple episodes of incontinence. EXAM: CT ABDOMEN AND PELVIS WITHOUT CONTRAST TECHNIQUE: Multidetector CT imaging of the abdomen and pelvis was performed following the standard protocol without IV contrast. COMPARISON:  06/21/2008 FINDINGS: Lower chest: Atelectasis and/or scarring in both lower lobes and right middle lobe. Normal visualized cardiac chambers size without pericardial effusion. Aortic atherosclerosis along the descending thoracic aorta. Small hiatal hernia. Hepatobiliary: No focal liver abnormality is seen. No gallstones, gallbladder wall thickening, or biliary dilatation. Pancreas: Unremarkable. No pancreatic ductal dilatation or surrounding inflammatory changes.  Spleen: Normal in size without focal abnormality. Adrenals/Urinary Tract: Normal bilateral adrenal glands. No obstructive uropathy. Renovascular calcifications are noted of the right renal hilum. Punctate 2 mm calcifications in the interpolar and lower pole of the right kidney may either represent nonobstructing renal calculi or vascular calcifications. No hydroureteronephrosis. Urinary bladder is unremarkable. Stomach/Bowel: Stomach is within normal limits. Appendix appears normal. No evidence of bowel wall thickening, distention, or inflammatory changes. Descending and sigmoid diverticulosis is again noted. No evidence of acute diverticulitis. Vascular/Lymphatic: Aortic and bi-iliac-femoral atherosclerosis. No enlarged abdominal or pelvic lymph nodes. Reproductive: Uterus and bilateral adnexa are unremarkable. Other: No abdominal wall hernia or abnormality. No abdominopelvic ascites. Musculoskeletal: Mild vacuum disc phenomena L3-4 and L4-5. No acute nor suspicious osseous abnormalities. Mild levoconvex curvature of the lumbar spine possibly positional. Slight joint space narrowing of both hips. Sclerosis of the pubic symphysis likely degenerative. IMPRESSION: 1. Descending and sigmoid diverticulosis without acute diverticulitis, bowel inflammation or obstruction. 2. Punctate nonobstructing calcifications associated with the interpolar and lower pole of the right kidney. Nephrolithiasis versus vascular calcifications. 3. No acute intraabdominal nor pelvic abnormality. Electronically Signed   By: Ashley Royalty M.D.   On: 10/31/2016 18:41   Ct Head Wo Contrast  Result Date: 10/31/2016 CLINICAL DATA:  Altered mental status, incontinent, remote strokes, diabetes, hypertension EXAM: CT HEAD WITHOUT CONTRAST TECHNIQUE: Contiguous axial images were obtained from the base of the skull through the vertex without intravenous contrast. COMPARISON:  06/07/2016, 06/06/2016 FINDINGS: Brain: Stable brain atrophy and chronic  white matter microvascular ischemic changes throughout the cerebral hemispheres. No acute intracranial hemorrhage, definite infarction, mass lesion, midline shift, herniation, hydrocephalus, or extra-axial fluid collection. Cisterns remain patent. No cerebellar abnormality. 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 Vascular: Atherosclerosis of the intracranial vessels at the skullbase. Skull: Normal. Negative for fracture or focal lesion. Sinuses/Orbits: No acute finding. Other: None. IMPRESSION: Brain atrophy and chronic white matter microvascular changes. No acute intracranial abnormality by noncontrast CT. Resolved remote left pontine brainstem hemorrhage compare to 06/07/2016. Electronically Signed   By: Jerilynn Mages.  Shick M.D.   On: 10/31/2016 17:10   Dg Chest Port 1 View  Result Date: 10/31/2016 CLINICAL DATA:  Altered mental status  today. History of hypertension and diabetes. Shortness of breath. History of tobacco and cocaine abuse. EXAM: PORTABLE CHEST 1 VIEW COMPARISON:  06/10/2016. FINDINGS: Normal cardiomediastinal silhouette for the degree of inspiration. Slight bibasilar subsegmental atelectasis and mild vascular congestion. No active infiltrates or failure. No effusion or pneumothorax. Bones unremarkable. IMPRESSION: Stable chest radiograph.  No overt failure or consolidation. Electronically Signed   By: Staci Righter M.D.   On: 10/31/2016 16:06    Assessment/Plan : Acute Encephalopathy, multifactorial. Related to the following : DKA. Hyperglycemic Crisis. Electrolyte Issues. H/O non compliance to meds.  AKI, possible uremia.  HTN urgency. Possible PRES Cocaine abuse/intoxication No obvious source of infection - Continue DKA protocol. - Continue fluid resuscitation - Patient is currently protecting her airway. Watch in ICU closely. May need to be intubated if she will not protect her airway.  - Hydralazine 20 mg IV q 4 hrs prn - Neurology consult.  Will defer need for EEG and MRI  to neurology - check PCT.  On zosyn and vanc >> deescalate if cultures remain (-).    DKA.  Hyperglycemic crisis - cont insulin drip - cont IVF   AKI - as above - cont IVF   Focal seizure, related to hyperglycemic state/AKI/uremia/cocaine abuse. R/O primary sze d/o - Neurology consult - cont Keppra - will likely need EEG and MRI. Will defer to neuro    Best practice : heparin sq for dvt prophylaxis.    I spent  30 minutes of Critical Care time with this patient today. This is my time spent independent of the APP or resident.   Family :  No family at bedside.    Monica Becton, MD 10/31/2016, 10:14 PM [facility] Pager [phone] After 3 pm or if no answer, call [phone]

## 2016-10-31 NOTE — ED Triage Notes (Signed)
EMS called by pts husbands home health nurse. She was found to be altered this am with multiple episodes of incontinence on couch. Pt baseline A&O x 4 and independent. Pt responds to pain but is non verbal and not able to follow commands.

## 2016-10-31 NOTE — ED Notes (Signed)
Royston Bake (pt daughter) [phone]

## 2016-10-31 NOTE — ED Notes (Signed)
Venous blood gasresult  given to Dr. Leonette Monarch

## 2016-10-31 NOTE — Progress Notes (Signed)
Pharmacy Antibiotic Note  [name] is a 61 y.o. female admitted on 10/31/2016 with sepsis.  Pharmacy has been consulted for vancomycin and zosyn dosing. Patient was also started on ceftriaxone.  Plan: Vancomycin 2g*1 load Vancomycin 1000 IV every 48 hours.  Goal trough 15-20 mcg/mL. Zosyn 3.375g IV q8h (4 hour infusion).  Follow renal function, culture data, and LOT plans Vanc trough as indicated  Height: 5\' 8"  (172.7 cm) Weight: 225 lb (102.1 kg) IBW/kg (Calculated) : 63.9  Temp (24hrs), Avg:101.8 F (38.8 C), Min:101.8 F (38.8 C), Max:101.8 F (38.8 C)   Recent Labs Lab 10/31/16 1513 10/31/16 1524  WBC 14.2*  --   CREATININE 3.77*  --   LATICACIDVEN  --  4.65*    Estimated Creatinine Clearance: 20.1 mL/min (A) (by C-G formula based on SCr of 3.77 mg/dL (H)).    No Known Allergies  Antimicrobials this admission: 4/24 Vanc >>  4/24 Zosyn >>  4/24 Ceftriaxone >>   Dose adjustments this admission:  Microbiology results: 4/24 BCx:   Thank you for allowing pharmacy to be a part of this patient's care.  Melburn Popper 10/31/2016 6:33 PM

## 2016-10-31 NOTE — Consult Note (Signed)
NEURO HOSPITALIST CONSULT NOTE   Requestig physician: Dr. Sloan Leiter  Reason for Consult: Twitching and obtundation in the context of markedly elevated CBG  History obtained from:   Chart    HPI:                                                                                                                                          [name] is an 61 y.o. female who presented after being found altered Tuesday morning by her husband with multiple episodes of incontinence. On initial presentation to the ED, the patient was nonverbal and unable to follow commands.   ER work up revealed a severely elevated blood glucose of 1200. Hospitalist documented that hyperosmolar nonketotic state was the most likely underlying etiology for her encephalopathy and acute renal failure. She was also diagnosed with possible sepsis in the ED and empiric treatment with vancomycin and Zosyn was started. She has chronic kidney disease stage III, with labs indicative of ARF as well. The acute component was felt likely to be secondary to hemodynamically mediated kidney injury given her severe sepsis. The plan is to aggressively hydrate and follow kidney function very closely while lowering her glucose with insulin drip.   In the emergency room, the patient exhibited twitching of the left side of her face and left upper extremity. It was unknown whether this was a new phenomenon or if she had a prior history of seizures. IV Keppra bolus was administered. Of note, the patient was discharged from the hospital in December on Keppra, but it is unknown if she remained on this medication afterward. EEG was ordered.   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 She may have residual right sided deficits from the prior hemorrhage.     Past Medical History:  Diagnosis Date  . Diabetes mellitus    Hb A1C = 12.6 on 05/15/11, managed on Novolog  70/30, 35 U qam, 25 U qpm  . Hypertension    poorly controlled  . Shortness of breath     Past Surgical History:  Procedure Laterality Date  . ANKLE FRACTURE SURGERY  2007    No family history on file.   Social History:  reports that she has been smoking.  She has a 12.50 pack-year smoking history. She has never used smokeless tobacco. She reports that she drinks about 7.2 oz of alcohol per week . She reports that she uses drugs, including Cocaine.  No Known Allergies  MEDICATIONS:  Prior to Admission:  Prescriptions Prior to Admission  Medication Sig Dispense Refill Last Dose  . acyclovir (ZOVIRAX) 400 MG tablet Take 1 tablet (400 mg total) by mouth 4 (four) times daily. 50 tablet 0   . amLODipine (NORVASC) 10 MG tablet Take 1 tablet (10 mg total) by mouth daily. 30 tablet 1   . cyclobenzaprine (FLEXERIL) 10 MG tablet Take 1 tablet (10 mg total) by mouth 2 (two) times daily as needed for muscle spasms. 60 tablet 0   . folic acid (FOLVITE) 1 MG tablet Take 1 tablet (1 mg total) by mouth daily. 30 tablet 0   . gabapentin (NEURONTIN) 300 MG capsule Take 2 capsules (600 mg total) by mouth 4 (four) times daily. 180 capsule 0   . guaiFENesin (MUCINEX) 600 MG 12 hr tablet Take 1 tablet (600 mg total) by mouth 2 (two) times daily. 60 tablet 0   . hydrochlorothiazide (MICROZIDE) 12.5 MG capsule Take 1 capsule (12.5 mg total) by mouth daily. 30 capsule 1   . insulin aspart protamine- aspart (NOVOLOG MIX 70/30) (70-30) 100 UNIT/ML injection Inject 0.33 mLs (33 Units total) into the skin daily with supper. 10 mL 11   . insulin aspart protamine- aspart (NOVOLOG MIX 70/30) (70-30) 100 UNIT/ML injection Inject 0.53 mLs (53 Units total) into the skin daily with breakfast. 10 mL 11   . levETIRAcetam (KEPPRA) 1000 MG tablet Take 1 tablet (1,000 mg total) by mouth every 12 (twelve) hours.  60 tablet 0   . lisinopril (PRINIVIL,ZESTRIL) 10 MG tablet Take 1 tablet (10 mg total) by mouth daily. 30 tablet 1   . Maltodextrin-Xanthan Gum (RESOURCE THICKENUP CLEAR) POWD Take 360 g by mouth as needed. 3 Can 1   . metoprolol tartrate (LOPRESSOR) 25 MG tablet Take 1 tablet (25 mg total) by mouth 2 (two) times daily. 60 tablet 0   . Multiple Vitamin (MULTIVITAMIN WITH MINERALS) TABS tablet Take 1 tablet by mouth daily.     . nicotine (NICODERM CQ - DOSED IN MG/24 HOURS) 14 mg/24hr patch 14 mg patch daily 2 weeks then 7 mg patch daily 3 weeks and stop 28 patch 0   . pantoprazole (PROTONIX) 40 MG tablet Take 1 tablet (40 mg total) by mouth daily. 30 tablet 0   . simvastatin (ZOCOR) 10 MG tablet Take 1 tablet (10 mg total) by mouth at bedtime. 30 tablet 0   . traMADol (ULTRAM) 50 MG tablet Take 1 tablet (50 mg total) by mouth daily as needed (for pain). 20 tablet 0   . traZODone (DESYREL) 100 MG tablet Take 1 tablet (100 mg total) by mouth at bedtime. 30 tablet 0    Scheduled: . heparin  5,000 Units Subcutaneous Q8H  . insulin regular  0-10 Units Intravenous TID WC  . pantoprazole (PROTONIX) IV  40 mg Intravenous Q24H     ROS:  Unable to obtain due to AMS.   Blood pressure (!) 189/91, pulse (!) 121, temperature (!) 101.8 F (38.8 C), temperature source Rectal, resp. rate 19, height 5\' 8"  (1.727 m), weight 102.1 kg (225 lb), SpO2 96 %.  General Examination:                                                                                                      HEENT-  [place]/AT   Lungs- Respirations unlabored Extremities- Warm and well perfused  Neurological Examination Mental Status: Awake, disoriented. Fluent speech output in response to questions is tangential with frequent perseveration. Correctly follows about 20% of simple commands. Names one object  correctly.  Cranial Nerves: II: Blinks to threat bilaterally. PERRL. III,IV, VI: ptosis not present, directs saccades towards visual stimuli in left and right visual fields. No nystagmus noted.  V,VII: smile symmetric, reacts to tactile stimulation bilaterally VIII: hearing intact to voice IX,X: no hypophonia or hoarseness XI: no asymmetry noted XII: does not follow command for tongue extension Motor/Sensory: Moves all 4 extremities to noxious stimuli without asymmetry.  Deep Tendon Reflexes: 2+ and symmetric throughout Plantars: Right: downgoing   Left: downgoing Cerebellar: Does not follow commands Gait: Deferred Other: No twitching or other motor findings suggestive of seizure are seen on examination.   Lab Results: Basic Metabolic Panel:  Recent Labs Lab 10/31/16 1513  NA 141  K 5.1  CL 98*  CO2 18*  GLUCOSE 1,200*  BUN 52*  CREATININE 3.77*  CALCIUM 9.6    Liver Function Tests:  Recent Labs Lab 10/31/16 1513  AST 20  ALT 16  ALKPHOS 69  BILITOT 1.4*  PROT 9.0*  ALBUMIN 3.4*   No results for input(s): LIPASE, AMYLASE in the last 168 hours. No results for input(s): AMMONIA in the last 168 hours.  CBC:  Recent Labs Lab 10/31/16 1513  WBC 14.2*  NEUTROABS 11.8*  HGB 16.0*  HCT 52.2*  MCV 100.6*  PLT 270    Cardiac Enzymes: No results for input(s): CKTOTAL, CKMB, CKMBINDEX, TROPONINI in the last 168 hours.  Lipid Panel: No results for input(s): CHOL, TRIG, HDL, CHOLHDL, VLDL, LDLCALC in the last 168 hours.  CBG:  Recent Labs Lab 10/31/16 1456 10/31/16 1703 10/31/16 1826  GLUCAP >600* >600* >600*    Microbiology: Results for orders placed or performed during the hospital encounter of 06/09/16  Urine culture     Status: Abnormal   Collection Time: 06/10/16  5:54 AM  Result Value Ref Range Status   Specimen Description URINE, CLEAN CATCH  Final   Special Requests NONE  Final   Culture MULTIPLE SPECIES PRESENT, SUGGEST RECOLLECTION (A)   Final   Report Status 06/11/2016 FINAL  Final    Coagulation Studies:  Recent Labs  10/31/16 1513  LABPROT 14.1  INR 1.09    Imaging: Ct Abdomen Pelvis Wo Contrast  Result Date: 10/31/2016 CLINICAL DATA:  Sepsis. Patient unable to tolerate oral contrast. Multiple episodes of incontinence. EXAM: CT ABDOMEN AND PELVIS WITHOUT CONTRAST TECHNIQUE: Multidetector CT imaging of the abdomen and pelvis was performed following  the standard protocol without IV contrast. COMPARISON:  06/21/2008 FINDINGS: Lower chest: Atelectasis and/or scarring in both lower lobes and right middle lobe. Normal visualized cardiac chambers size without pericardial effusion. Aortic atherosclerosis along the descending thoracic aorta. Small hiatal hernia. Hepatobiliary: No focal liver abnormality is seen. No gallstones, gallbladder wall thickening, or biliary dilatation. Pancreas: Unremarkable. No pancreatic ductal dilatation or surrounding inflammatory changes. Spleen: Normal in size without focal abnormality. Adrenals/Urinary Tract: Normal bilateral adrenal glands. No obstructive uropathy. Renovascular calcifications are noted of the right renal hilum. Punctate 2 mm calcifications in the interpolar and lower pole of the right kidney may either represent nonobstructing renal calculi or vascular calcifications. No hydroureteronephrosis. Urinary bladder is unremarkable. Stomach/Bowel: Stomach is within normal limits. Appendix appears normal. No evidence of bowel wall thickening, distention, or inflammatory changes. Descending and sigmoid diverticulosis is again noted. No evidence of acute diverticulitis. Vascular/Lymphatic: Aortic and bi-iliac-femoral atherosclerosis. No enlarged abdominal or pelvic lymph nodes. Reproductive: Uterus and bilateral adnexa are unremarkable. Other: No abdominal wall hernia or abnormality. No abdominopelvic ascites. Musculoskeletal: Mild vacuum disc phenomena L3-4 and L4-5. No acute nor suspicious osseous  abnormalities. Mild levoconvex curvature of the lumbar spine possibly positional. Slight joint space narrowing of both hips. Sclerosis of the pubic symphysis likely degenerative. IMPRESSION: 1. Descending and sigmoid diverticulosis without acute diverticulitis, bowel inflammation or obstruction. 2. Punctate nonobstructing calcifications associated with the interpolar and lower pole of the right kidney. Nephrolithiasis versus vascular calcifications. 3. No acute intraabdominal nor pelvic abnormality. Electronically Signed   By: Ashley Royalty M.D.   On: 10/31/2016 18:41   Ct Head Wo Contrast  Result Date: 10/31/2016 CLINICAL DATA:  Altered mental status, incontinent, remote strokes, diabetes, hypertension EXAM: CT HEAD WITHOUT CONTRAST TECHNIQUE: Contiguous axial images were obtained from the base of the skull through the vertex without intravenous contrast. COMPARISON:  06/07/2016, 06/06/2016 FINDINGS: Brain: Stable brain atrophy and chronic white matter microvascular ischemic changes throughout the cerebral hemispheres. No acute intracranial hemorrhage, definite infarction, mass lesion, midline shift, herniation, hydrocephalus, or extra-axial fluid collection. Cisterns remain patent. No cerebellar abnormality. 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 Vascular: Atherosclerosis of the intracranial vessels at the skullbase. Skull: Normal. Negative for fracture or focal lesion. Sinuses/Orbits: No acute finding. Other: None. IMPRESSION: Brain atrophy and chronic white matter microvascular changes. No acute intracranial abnormality by noncontrast CT. Resolved remote left pontine brainstem hemorrhage compare to 06/07/2016. Electronically Signed   By: Jerilynn Mages.  Shick M.D.   On: 10/31/2016 17:10   Dg Chest Port 1 View  Result Date: 10/31/2016 CLINICAL DATA:  Altered mental status today. History of hypertension and diabetes. Shortness of breath. History of tobacco and cocaine abuse. EXAM: PORTABLE CHEST 1 VIEW  COMPARISON:  06/10/2016. FINDINGS: Normal cardiomediastinal silhouette for the degree of inspiration. Slight bibasilar subsegmental atelectasis and mild vascular congestion. No active infiltrates or failure. No effusion or pneumothorax. Bones unremarkable. IMPRESSION: Stable chest radiograph.  No overt failure or consolidation. Electronically Signed   By: Staci Righter M.D.   On: 10/31/2016 16:06    Assessment: 1. Left sided twitching in the setting of severe hyperglycemia. This has resolved with CBG now below 300. May have been secondary to focal status epilepticus, which is known to occur in some cases of severe hyperglycemia.  2. Severe hyperglycemia. 3. CT head reveals resolved remote left pontine hemorrhage.   Recommendations: 1. EEG in AM.  2. Seizure precautions.  3. Keppra 500 mg IV BID.  4. Continue to monitor for improvement as glucose  levels normalize and metabolic parameters improve.  5. Discontinue Ultram and do not discharge the patient on this medication, as it can lower the seizure threshold.   Electronically signed: Dr. Kerney Elbe 10/31/2016, 7:13 PM

## 2016-10-31 NOTE — ED Notes (Signed)
Upon returning from ct transporter notified RN that LEFT hand iv was accidentally pulled out. New IV placed inRIGHT wrist

## 2016-10-31 NOTE — H&P (Signed)
HISTORY AND PHYSICAL       PATIENT DETAILS Name: [name] Age: 61 y.o. Sex: female Date of Birth: [birth year] Admit Date: 10/31/2016 NAT:[id number] A Avbuere, MD   Patient coming from: Home   CHIEF COMPLAINT:  Altered mental status  HPI: [name] is a 61 y.o. 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 No family member present at bedside, I have tried calling both the numbers listed on the face sheet without success, patient is very encephalopathic and unable to provide any history. Most of this history is obtained after speaking with the RN and in reviewing ED note. As noted above, EMS was called after patient was found obtunded, confused and incontinent at home. In the emergency room, patient was noted to have twitching of the left side of her face and left upper extremity. It is unknown whether this is a new phenomenon or old phenomenon for her.   It is unknown when she started getting confused, not sure if she has had seizures. Unknown whether patient has had fevers in the past, but febrile, tachycardic here in the emergency room. Unable to get history of headache or travel history.  ED Course:  In the emergency room, patient was found to have a blood sugar of 1200, creatinine of 3.77, lactic acid of 4.65. She was found to be encephalopathic, tachycardic. A UA was negative for UTI, chest x-ray did not show pneumonia. CT of the head did not show any acute abnormalities. She was given 3 L IV fluid boluses, empirically started on broad-spectrum antibiotics, and the hospitalist  service was asked to admit this patient for further evaluation and treatment.  Note: Lives at: Home Mobility:  Independent* Chronic Indwelling Foley:no   REVIEW OF SYSTEMS:  Due to mental status-unable to perform a 12 point review of systems   ALLERGIES:  No Known Allergies  PAST MEDICAL HISTORY: Past Medical History:  Diagnosis Date  . Diabetes mellitus    Hb A1C = 12.6 on 05/15/11, managed on Novolog 70/30, 35 U qam, 25 U qpm  . Hypertension    poorly controlled  . Shortness of breath     PAST SURGICAL HISTORY: Past Surgical History:  Procedure Laterality Date  . ANKLE FRACTURE SURGERY  2007    MEDICATIONS AT HOME: Prior to Admission medications   Medication Sig Start Date End Date Taking? Authorizing Provider  acyclovir (ZOVIRAX) 400 MG tablet Take 1 tablet (400 mg total) by mouth 4 (four) times daily. 06/20/16   Lavon Paganini Angiulli, PA-C  amLODipine (NORVASC) 10 MG tablet Take 1 tablet (10 mg total) by mouth daily. 06/20/16   Lavon Paganini Angiulli, PA-C  cyclobenzaprine (FLEXERIL) 10 MG tablet Take 1 tablet (10 mg total) by mouth 2 (two) times daily as needed for muscle spasms. 06/21/16   Lavon Paganini Angiulli, PA-C  folic acid (FOLVITE) 1 MG tablet Take 1 tablet (1 mg total) by mouth daily. 06/20/16   Lavon Paganini Angiulli, PA-C  gabapentin (NEURONTIN) 300 MG capsule Take 2 capsules (600 mg total) by mouth 4 (four) times daily. 06/21/16   Lavon Paganini Angiulli, PA-C  guaiFENesin (MUCINEX) 600  MG 12 hr tablet Take 1 tablet (600 mg total) by mouth 2 (two) times daily. 06/20/16   Lavon Paganini Angiulli, PA-C  hydrochlorothiazide (MICROZIDE) 12.5 MG capsule Take 1 capsule (12.5 mg total) by mouth daily. 06/21/16   Lavon Paganini Angiulli, PA-C  insulin aspart protamine- aspart (NOVOLOG MIX 70/30) (70-30) 100 UNIT/ML injection Inject 0.33 mLs (33 Units total) into the skin daily with supper. 06/20/16   Lavon Paganini Angiulli, PA-C  insulin aspart protamine- aspart (NOVOLOG MIX 70/30) (70-30) 100 UNIT/ML  injection Inject 0.53 mLs (53 Units total) into the skin daily with breakfast. 06/20/16   Lavon Paganini Angiulli, PA-C  levETIRAcetam (KEPPRA) 1000 MG tablet Take 1 tablet (1,000 mg total) by mouth every 12 (twelve) hours. 06/20/16   Lavon Paganini Angiulli, PA-C  lisinopril (PRINIVIL,ZESTRIL) 10 MG tablet Take 1 tablet (10 mg total) by mouth daily. 06/21/16   Daniel J Angiulli, PA-C  Maltodextrin-Xanthan Gum (RESOURCE THICKENUP CLEAR) POWD Take 360 g by mouth as needed. 06/20/16   Lavon Paganini Angiulli, PA-C  metoprolol tartrate (LOPRESSOR) 25 MG tablet Take 1 tablet (25 mg total) by mouth 2 (two) times daily. 06/20/16   Lavon Paganini Angiulli, PA-C  Multiple Vitamin (MULTIVITAMIN WITH MINERALS) TABS tablet Take 1 tablet by mouth daily. 06/20/16   Lavon Paganini Angiulli, PA-C  nicotine (NICODERM CQ - DOSED IN MG/24 HOURS) 14 mg/24hr patch 14 mg patch daily 2 weeks then 7 mg patch daily 3 weeks and stop 06/20/16   Lavon Paganini Angiulli, PA-C  pantoprazole (PROTONIX) 40 MG tablet Take 1 tablet (40 mg total) by mouth daily. 06/20/16   Lavon Paganini Angiulli, PA-C  simvastatin (ZOCOR) 10 MG tablet Take 1 tablet (10 mg total) by mouth at bedtime. 06/21/16   Lavon Paganini Angiulli, PA-C  traMADol (ULTRAM) 50 MG tablet Take 1 tablet (50 mg total) by mouth daily as needed (for pain). 06/20/16   Lavon Paganini Angiulli, PA-C  traZODone (DESYREL) 100 MG tablet Take 1 tablet (100 mg total) by mouth at bedtime. 06/21/16   Lavon Paganini Angiulli, PA-C    FAMILY HISTORY: Given mental status unable to obtain any family history.  SOCIAL HISTORY: Unable to obtain any social history-per prior documentation, she has a known history of tobacco, alcohol and cocaine use.  PHYSICAL EXAM: Blood pressure (!) 189/91, pulse (!) 121, temperature (!) 101.8 F (38.8 C), temperature source Rectal, resp. rate 19, height 5\' 8"  (1.727 m), weight 102.1 kg (225 lb), SpO2 96 %.  General appearance: Very encephalopathic-unable to follow any commands. She looks very dry and  acutely ill. Unable to assess speech.  Eyes:, pupils equally reactive to light  HEENT: Atraumatic and Normocephalic Neck: supple Resp:Good air entry bilaterally, clear to auscultation anteriorly  CVS: S1 S2 regular, tachycardic to 140s. GI: Diffusely tender all over especially in her lower abdomen. But no obvious peritoneal signs seen.  Extremities: B/L Lower Ext shows no edema Neurology: Able to assess-but she seems to be moving all 4 extremities. She has intermittent twitching movement of left side of the face and left upper extremity. Unable to assess any further. Psychiatric: Confused-unable to assess. Musculoskeletal:No digital cyanosis Skin:No Rash, warm and dry Wounds:N/A  LABS ON ADMISSION:  I have personally reviewed following labs and imaging studies  CBC:  Recent Labs Lab 10/31/16 1513  WBC 14.2*  NEUTROABS 11.8*  HGB 16.0*  HCT 52.2*  MCV 100.6*  PLT 631    Basic Metabolic Panel:  Recent Labs Lab 10/31/16 1513  NA 141  K 5.1  CL 98*  CO2 18*  GLUCOSE 1,200*  BUN 52*  CREATININE 3.77*  CALCIUM 9.6    GFR: Estimated Creatinine Clearance: 20.1 mL/min (A) (by C-G formula based on SCr of 3.77 mg/dL (H)).  Liver Function Tests:  Recent Labs Lab 10/31/16 1513  AST 20  ALT 16  ALKPHOS 69  BILITOT 1.4*  PROT 9.0*  ALBUMIN 3.4*   No results for input(s): LIPASE, AMYLASE in the last 168 hours. No results for input(s): AMMONIA in the last 168 hours.  Coagulation Profile:  Recent Labs Lab 10/31/16 1513  INR 1.09    Cardiac Enzymes: No results for input(s): CKTOTAL, CKMB, CKMBINDEX, TROPONINI in the last 168 hours.  BNP (last 3 results) No results for input(s): PROBNP in the last 8760 hours.  HbA1C: No results for input(s): HGBA1C in the last 72 hours.  CBG:  Recent Labs Lab 10/31/16 1456 10/31/16 1703 10/31/16 1826  GLUCAP >600* >600* >600*    Lipid Profile: No results for input(s): CHOL, HDL, LDLCALC, TRIG, CHOLHDL, LDLDIRECT  in the last 72 hours.  Thyroid Function Tests: No results for input(s): TSH, T4TOTAL, FREET4, T3FREE, THYROIDAB in the last 72 hours.  Anemia Panel: No results for input(s): VITAMINB12, FOLATE, FERRITIN, TIBC, IRON, RETICCTPCT in the last 72 hours.  Urine analysis:    Component Value Date/Time   COLORURINE YELLOW 10/31/2016 1513   APPEARANCEUR CLEAR 10/31/2016 1513   LABSPEC 1.024 10/31/2016 1513   PHURINE 5.0 10/31/2016 1513   GLUCOSEU >=500 (A) 10/31/2016 1513   GLUCOSEU > 1000 mg/dL (A) 11/01/2007 2056   HGBUR MODERATE (A) 10/31/2016 1513   HGBUR negative 07/15/2007 0954   BILIRUBINUR NEGATIVE 10/31/2016 1513   KETONESUR 5 (A) 10/31/2016 1513   PROTEINUR >=300 (A) 10/31/2016 1513   UROBILINOGEN 1.0 04/09/2012 1524   NITRITE NEGATIVE 10/31/2016 1513   LEUKOCYTESUR NEGATIVE 10/31/2016 1513    Sepsis Labs: Lactic Acid, Venous    Component Value Date/Time   LATICACIDVEN 4.65 (HH) 10/31/2016 1524    Microbiology: No results found for this or any previous visit (from the past 240 hour(s)).    RADIOLOGIC STUDIES ON ADMISSION: Ct Head Wo Contrast  Result Date: 10/31/2016 CLINICAL DATA:  Altered mental status, incontinent, remote strokes, diabetes, hypertension EXAM: CT HEAD WITHOUT CONTRAST TECHNIQUE: Contiguous axial images were obtained from the base of the skull through the vertex without intravenous contrast. COMPARISON:  06/07/2016, 06/06/2016 FINDINGS: Brain: Stable brain atrophy and chronic white matter microvascular ischemic changes throughout the cerebral hemispheres. No acute intracranial hemorrhage, definite infarction, mass lesion, midline shift, herniation, hydrocephalus, or extra-axial fluid collection. Cisterns remain patent. No cerebellar abnormality. 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 Vascular: Atherosclerosis of the intracranial vessels at the skullbase. Skull: Normal. Negative for fracture or focal lesion. Sinuses/Orbits: No acute finding.  Other: None. IMPRESSION: Brain atrophy and chronic white matter microvascular changes. No acute intracranial abnormality by noncontrast CT. Resolved remote left pontine brainstem hemorrhage compare to 06/07/2016. Electronically Signed   By: Jerilynn Mages.  Shick M.D.   On: 10/31/2016 17:10   Dg Chest Port 1 View  Result Date: 10/31/2016 CLINICAL DATA:  Altered mental status today. History of hypertension and diabetes. Shortness of breath. History of tobacco and cocaine abuse. EXAM: PORTABLE CHEST 1 VIEW COMPARISON:  06/10/2016. FINDINGS: Normal cardiomediastinal silhouette for the degree of inspiration. Slight bibasilar subsegmental atelectasis and mild vascular congestion. No active infiltrates or failure. No effusion or pneumothorax. Bones unremarkable. IMPRESSION: Stable chest radiograph.  No overt failure or consolidation. Electronically Signed  By: Staci Righter M.D.   On: 10/31/2016 16:06    I have personally reviewed images of chest xray or the CT head  EKG:  Personally reviewed. Sinus tachycardia  ASSESSMENT AND PLAN: Acute metabolic encephalopathy: Although febrile-I suspect this is likely secondary to severe metabolic derangements. CT head negative, neck appears relatively supple. For now I will attempt to correct underlying electrolyte abnormalities, if after correction of her underlying electrolyte abnormalities-if her mental status does not improve then would consider workup for other causes. She does have left facial twitching and left upper extremity twitching-not sure if she has complex partial seizures in this setting. Will be empirically covered with Keppra (see discussion below). EEG has been ordered. Neurology has been consulted.  Severe sepsis: Etiology not evident in no foci on exam-UA/chest x-ray negative. She is encephalopathic-but likely secondary to severe metabolic derangements. She does have diffuse tenderness in her abdominal exam-CT of the abdomen has been ordered and is currently  pending. Plans are to empirically cover with vancomycin and Zosyn, and await culture data and CT of the abdomen. Given encephalopathy, acute renal failure, ongoing tachycardia-have asked Critical care to evaluate as well.  Likely Hyperosmolar nonketotic state due to severe hyperglycemia: Probably causing encephalopathy, acute renal failure. Not sure if she is compliant with her medications and particularly insulin. She has already been bolused 3 L of IV fluids, and has been started on IV insulin. We'll continue aggressive IV hydration and insulin. Once her sugars are in range and if her mental status has improved-will need to be converted back to subcutaneous insulin. Her most recent A1c in November was 11.4.  Left facial/left arm twitching: Not sure if these represent seizures in a setting of severe metabolic derangements. She also has a known foci due to recent pontine hemorrhage. Have requested neurology to consult-spoke with Dr. Carron Brazen will start IV Keppra. Not sure if patient compliant with antiepileptic therapy-or is even on antiepileptic therapy at home (although was discharged from the hospital in December on Keppra)  Acute renal failure on chronic kidney disease stage III: Acute renal failure is likely secondary to hemodynamically mediated kidney injury in a setting of severe sepsis, profound hyperglycemia. She will be aggressively hydrated, kidney function will be followed very closely. She likely has underlying diabetic nephropathy causing chronic kidney disease stage III.  Hypertension: Blood pressures appear elevated-not sure if she's been compliant-may be a sympathetic response to sepsis. Will use IV hydralazine as needed. Avoid beta blockers given history of cocaine use. She is clinically improved-and oral intake has been established, will need to be transitioned to oral medications.  History of recent pontine hemorrhage (November 2017): CT head without any acute abnormalities. Seems  to be moving all 4 extremities-apparently has some right sided residual deficits. Will need PT evaluation  History of cocaine/tobacco/? Alcohol use: Check UDS and alcohol levels.  GERD: Will be on IV PPI.  Further plan will depend as patient's clinical course evolves and further radiologic and laboratory data become available. Patient will be monitored closely.  CONSULTS: PCCM Neuro  DVT Prophylaxis: SCD's  Code Status: Full Code  Disposition Plan:  Discharge back home vs SNF possibly in 4-5 days, depending on clinical course  Admission status:  Inpatient going to SDU  The patient is critically ill with multiple organ system failure and requires high complexity decision making for assessment and support, frequent evaluation and titration of therapies, advanced monitoring, review of radiographic studies and interpretation of complex data.   Total time  spent  65 minutes.Greater than 50% of this time was spent in counseling, explanation of diagnosis, planning of further management, and coordination of care.  Oren Binet [facility] Pager [phone]  If 7PM-7AM, please contact night-coverage [email or web address] Password [facility] 10/31/2016, 6:41 PM

## 2016-10-31 NOTE — ED Notes (Signed)
Pt in CT at this time- will recheck CBG when she returns and adjust insulin drip

## 2016-11-01 ENCOUNTER — Inpatient Hospital Stay (HOSPITAL_COMMUNITY): Payer: Medicaid Other

## 2016-11-01 DIAGNOSIS — E0910 Drug or chemical induced diabetes mellitus with ketoacidosis without coma: Secondary | ICD-10-CM

## 2016-11-01 DIAGNOSIS — R4182 Altered mental status, unspecified: Secondary | ICD-10-CM

## 2016-11-01 DIAGNOSIS — R569 Unspecified convulsions: Secondary | ICD-10-CM

## 2016-11-01 DIAGNOSIS — G9340 Encephalopathy, unspecified: Secondary | ICD-10-CM

## 2016-11-01 LAB — PHOSPHORUS: Phosphorus: 1.2 mg/dL — ABNORMAL LOW (ref 2.5–4.6)

## 2016-11-01 LAB — BASIC METABOLIC PANEL
Anion gap: 10 (ref 5–15)
Anion gap: 13 (ref 5–15)
Anion gap: 11 (ref 5–15)
Anion gap: 9 (ref 5–15)
BUN: 35 mg/dL — ABNORMAL HIGH (ref 6–20)
BUN: 29 mg/dL — ABNORMAL HIGH (ref 6–20)
BUN: 25 mg/dL — AB (ref 6–20)
BUN: 25 mg/dL — AB (ref 6–20)
CALCIUM: 8.6 mg/dL — AB (ref 8.9–10.3)
CHLORIDE: 125 mmol/L — AB (ref 101–111)
CHLORIDE: 129 mmol/L — AB (ref 101–111)
CHLORIDE: 129 mmol/L — AB (ref 101–111)
CO2: 23 mmol/L (ref 22–32)
CO2: 20 mmol/L — AB (ref 22–32)
CO2: 17 mmol/L — ABNORMAL LOW (ref 22–32)
CO2: 18 mmol/L — ABNORMAL LOW (ref 22–32)
CREATININE: 2.21 mg/dL — AB (ref 0.44–1.00)
CREATININE: 2.14 mg/dL — AB (ref 0.44–1.00)
CREATININE: 2.17 mg/dL — AB (ref 0.44–1.00)
Calcium: 9.1 mg/dL (ref 8.9–10.3)
Calcium: 8.4 mg/dL — ABNORMAL LOW (ref 8.9–10.3)
Calcium: 8.5 mg/dL — ABNORMAL LOW (ref 8.9–10.3)
Chloride: 128 mmol/L — ABNORMAL HIGH (ref 101–111)
Creatinine, Ser: 2.24 mg/dL — ABNORMAL HIGH (ref 0.44–1.00)
GFR calc Af Amer: 27 mL/min — ABNORMAL LOW (ref >60)
GFR calc Af Amer: 28 mL/min — ABNORMAL LOW (ref >60)
GFR calc non Af Amer: 23 mL/min — ABNORMAL LOW (ref >60)
GFR calc non Af Amer: 24 mL/min — ABNORMAL LOW (ref >60)
GFR calc non Af Amer: 24 mL/min — ABNORMAL LOW (ref >60)
GFR, EST AFRICAN AMERICAN: 27 mL/min — AB (ref >60)
GFR, EST AFRICAN AMERICAN: 26 mL/min — AB (ref >60)
GFR, EST NON AFRICAN AMERICAN: 23 mL/min — AB (ref >60)
GLUCOSE: 212 mg/dL — AB (ref 65–99)
GLUCOSE: 313 mg/dL — AB (ref 65–99)
Glucose, Bld: 68 mg/dL (ref 65–99)
Glucose, Bld: 325 mg/dL — ABNORMAL HIGH (ref 65–99)
Potassium: 2.8 mmol/L — ABNORMAL LOW (ref 3.5–5.1)
Potassium: 3.6 mmol/L (ref 3.5–5.1)
Potassium: 3.8 mmol/L (ref 3.5–5.1)
Potassium: 3.9 mmol/L (ref 3.5–5.1)
SODIUM: 161 mmol/L — AB (ref 135–145)
Sodium: 158 mmol/L — ABNORMAL HIGH (ref 135–145)
Sodium: 156 mmol/L — ABNORMAL HIGH (ref 135–145)
Sodium: 157 mmol/L — ABNORMAL HIGH (ref 135–145)

## 2016-11-01 LAB — GLUCOSE, CAPILLARY
GLUCOSE-CAPILLARY: 193 mg/dL — AB (ref 65–99)
GLUCOSE-CAPILLARY: 139 mg/dL — AB (ref 65–99)
GLUCOSE-CAPILLARY: 95 mg/dL (ref 65–99)
GLUCOSE-CAPILLARY: 184 mg/dL — AB (ref 65–99)
GLUCOSE-CAPILLARY: 222 mg/dL — AB (ref 65–99)
GLUCOSE-CAPILLARY: 290 mg/dL — AB (ref 65–99)
GLUCOSE-CAPILLARY: 201 mg/dL — AB (ref 65–99)
Glucose-Capillary: 155 mg/dL — ABNORMAL HIGH (ref 65–99)
Glucose-Capillary: 178 mg/dL — ABNORMAL HIGH (ref 65–99)
Glucose-Capillary: 193 mg/dL — ABNORMAL HIGH (ref 65–99)
Glucose-Capillary: 260 mg/dL — ABNORMAL HIGH (ref 65–99)
Glucose-Capillary: 274 mg/dL — ABNORMAL HIGH (ref 65–99)
Glucose-Capillary: 365 mg/dL — ABNORMAL HIGH (ref 65–99)
Glucose-Capillary: 536 mg/dL (ref 65–99)

## 2016-11-01 LAB — MAGNESIUM: Magnesium: 2.3 mg/dL (ref 1.7–2.4)

## 2016-11-01 LAB — RESPIRATORY PANEL BY PCR
ADENOVIRUS-RVPPCR: NOT DETECTED
Bordetella pertussis: NOT DETECTED
CORONAVIRUS 229E-RVPPCR: NOT DETECTED
CORONAVIRUS HKU1-RVPPCR: NOT DETECTED
CORONAVIRUS NL63-RVPPCR: NOT DETECTED
CORONAVIRUS OC43-RVPPCR: NOT DETECTED
Chlamydophila pneumoniae: NOT DETECTED
Influenza A: NOT DETECTED
Influenza B: NOT DETECTED
METAPNEUMOVIRUS-RVPPCR: NOT DETECTED
Mycoplasma pneumoniae: NOT DETECTED
PARAINFLUENZA VIRUS 3-RVPPCR: NOT DETECTED
PARAINFLUENZA VIRUS 4-RVPPCR: NOT DETECTED
Parainfluenza Virus 1: NOT DETECTED
Parainfluenza Virus 2: NOT DETECTED
Respiratory Syncytial Virus: NOT DETECTED
Rhinovirus / Enterovirus: NOT DETECTED

## 2016-11-01 LAB — BLOOD CULTURE ID PANEL (REFLEXED)
Acinetobacter baumannii: NOT DETECTED
CANDIDA GLABRATA: NOT DETECTED
CANDIDA KRUSEI: NOT DETECTED
CANDIDA PARAPSILOSIS: NOT DETECTED
CANDIDA TROPICALIS: NOT DETECTED
Candida albicans: NOT DETECTED
ENTEROBACTER CLOACAE COMPLEX: NOT DETECTED
ENTEROCOCCUS SPECIES: NOT DETECTED
ESCHERICHIA COLI: NOT DETECTED
Enterobacteriaceae species: NOT DETECTED
Haemophilus influenzae: NOT DETECTED
KLEBSIELLA OXYTOCA: NOT DETECTED
KLEBSIELLA PNEUMONIAE: NOT DETECTED
LISTERIA MONOCYTOGENES: NOT DETECTED
Methicillin resistance: DETECTED — AB
Neisseria meningitidis: NOT DETECTED
PROTEUS SPECIES: NOT DETECTED
Pseudomonas aeruginosa: NOT DETECTED
SERRATIA MARCESCENS: NOT DETECTED
STAPHYLOCOCCUS AUREUS BCID: NOT DETECTED
STAPHYLOCOCCUS SPECIES: DETECTED — AB
Streptococcus agalactiae: NOT DETECTED
Streptococcus pneumoniae: NOT DETECTED
Streptococcus pyogenes: NOT DETECTED
Streptococcus species: NOT DETECTED

## 2016-11-01 LAB — CBC
HCT: 46.2 % — ABNORMAL HIGH (ref 36.0–46.0)
Hemoglobin: 15.7 g/dL — ABNORMAL HIGH (ref 12.0–15.0)
MCH: 31 pg (ref 26.0–34.0)
MCHC: 34 g/dL (ref 30.0–36.0)
MCV: 91.1 fL (ref 78.0–100.0)
PLATELETS: 258 10*3/uL (ref 150–400)
RBC: 5.07 MIL/uL (ref 3.87–5.11)
RDW: 13.6 % (ref 11.5–15.5)
WBC: 19.3 10*3/uL — AB (ref 4.0–10.5)

## 2016-11-01 LAB — INFLUENZA PANEL BY PCR (TYPE A & B)
Influenza A By PCR: NEGATIVE
Influenza B By PCR: NEGATIVE

## 2016-11-01 LAB — PROCALCITONIN: Procalcitonin: <0.1 ng/mL

## 2016-11-01 LAB — HIV ANTIBODY (ROUTINE TESTING W REFLEX): HIV SCREEN 4TH GENERATION: NONREACTIVE

## 2016-11-01 LAB — MRSA PCR SCREENING: MRSA by PCR: NEGATIVE

## 2016-11-01 MED ORDER — INSULIN ASPART 100 UNIT/ML ~~LOC~~ SOLN: 0.0000 [IU] | SUBCUTANEOUS | Status: DC

## 2016-11-01 MED ORDER — INSULIN GLARGINE 100 UNIT/ML ~~LOC~~ SOLN
30.0000 [IU] | Freq: Once | SUBCUTANEOUS | Status: AC
  Administered 2016-11-01: 30 [IU] via SUBCUTANEOUS
  Filled 2016-11-01: qty 0.3

## 2016-11-01 MED ORDER — POTASSIUM PHOSPHATES 15 MMOLE/5ML IV SOLN
40.0000 meq | Freq: Once | INTRAVENOUS | Status: AC
  Administered 2016-11-01: 40 meq via INTRAVENOUS
  Filled 2016-11-01: qty 9.09

## 2016-11-01 MED ORDER — INSULIN ASPART 100 UNIT/ML ~~LOC~~ SOLN
0.0000 [IU] | SUBCUTANEOUS | Status: DC
  Administered 2016-11-01 (×2): 11 [IU] via SUBCUTANEOUS
  Administered 2016-11-01 (×2): 7 [IU] via SUBCUTANEOUS
  Administered 2016-11-01: 4 [IU] via SUBCUTANEOUS
  Administered 2016-11-02: 15 [IU] via SUBCUTANEOUS
  Administered 2016-11-02: 4 [IU] via SUBCUTANEOUS
  Administered 2016-11-02: 7 [IU] via SUBCUTANEOUS
  Administered 2016-11-02: 4 [IU] via SUBCUTANEOUS
  Administered 2016-11-02: 11 [IU] via SUBCUTANEOUS
  Administered 2016-11-02: 7 [IU] via SUBCUTANEOUS
  Administered 2016-11-03: 4 [IU] via SUBCUTANEOUS
  Administered 2016-11-03: 3 [IU] via SUBCUTANEOUS

## 2016-11-01 MED ORDER — FENTANYL CITRATE (PF) 100 MCG/2ML IJ SOLN: 25.0000 ug | INTRAMUSCULAR | Status: DC | PRN

## 2016-11-01 MED ORDER — SODIUM CHLORIDE 0.9 % IV SOLN
Freq: Once | INTRAVENOUS | Status: AC
  Administered 2016-11-01: 03:00:00 via INTRAVENOUS
  Filled 2016-11-01: qty 1000

## 2016-11-01 MED ORDER — FENTANYL CITRATE (PF) 100 MCG/2ML IJ SOLN
INTRAMUSCULAR | Status: AC
  Filled 2016-11-01: qty 2

## 2016-11-01 MED ORDER — POTASSIUM CL IN DEXTROSE 5% 20 MEQ/L IV SOLN
20.0000 meq | INTRAVENOUS | Status: DC
  Administered 2016-11-01: 20 meq via INTRAVENOUS
  Filled 2016-11-01: qty 1000

## 2016-11-01 MED ORDER — VANCOMYCIN HCL IN DEXTROSE 1-5 GM/200ML-% IV SOLN
1000.0000 mg | INTRAVENOUS | Status: DC
  Administered 2016-11-01 – 2016-11-02 (×2): 1000 mg via INTRAVENOUS
  Filled 2016-11-01 (×3): qty 200

## 2016-11-01 MED ORDER — HEPARIN SODIUM (PORCINE) 5000 UNIT/ML IJ SOLN
5000.0000 [IU] | Freq: Three times a day (TID) | INTRAMUSCULAR | Status: DC
  Administered 2016-11-01 – 2016-11-02 (×2): 5000 [IU] via SUBCUTANEOUS
  Filled 2016-11-01 (×2): qty 1

## 2016-11-01 MED ORDER — CHLORHEXIDINE GLUCONATE 0.12 % MT SOLN
15.0000 mL | Freq: Two times a day (BID) | OROMUCOSAL | Status: DC
Start: 2016-11-01 — End: 2016-11-03
  Administered 2016-11-01 – 2016-11-03 (×4): 15 mL via OROMUCOSAL
  Filled 2016-11-01: qty 15

## 2016-11-01 MED ORDER — SODIUM PHOSPHATES 45 MMOLE/15ML IV SOLN: 10.0000 mmol | Freq: Once | INTRAVENOUS | Status: DC

## 2016-11-01 MED ORDER — DEXTROSE-NACL 5-0.45 % IV SOLN
INTRAVENOUS | Status: DC
  Administered 2016-11-01: 1000 mL via INTRAVENOUS

## 2016-11-01 MED ORDER — DEXTROSE 5 % IV SOLN
INTRAVENOUS | Status: DC
  Administered 2016-11-01 – 2016-11-02 (×2): via INTRAVENOUS

## 2016-11-01 MED ORDER — INSULIN GLARGINE 100 UNIT/ML ~~LOC~~ SOLN
30.0000 [IU] | Freq: Once | SUBCUTANEOUS | Status: AC
  Administered 2016-11-01: 30 [IU] via SUBCUTANEOUS

## 2016-11-01 MED ORDER — POTASSIUM PHOSPHATES 15 MMOLE/5ML IV SOLN: 20.0000 mmol | Freq: Once | INTRAVENOUS | Status: DC

## 2016-11-01 MED ORDER — INSULIN ASPART 100 UNIT/ML ~~LOC~~ SOLN
0.0000 [IU] | SUBCUTANEOUS | Status: DC
  Administered 2016-11-01: 5 [IU] via SUBCUTANEOUS

## 2016-11-01 MED ORDER — ORAL CARE MOUTH RINSE
15.0000 mL | Freq: Two times a day (BID) | OROMUCOSAL | Status: DC
  Administered 2016-11-01 – 2016-11-03 (×3): 15 mL via OROMUCOSAL

## 2016-11-01 MED ORDER — FENTANYL CITRATE (PF) 100 MCG/2ML IJ SOLN
25.0000 ug | Freq: Once | INTRAMUSCULAR | Status: AC
  Administered 2016-11-01: 25 ug via INTRAVENOUS

## 2016-11-01 NOTE — Progress Notes (Signed)
Pharmacy Antibiotic Note  [name] is a 61 y.o. female admitted on 10/31/2016 with sepsis.  Pharmacy has been consulted for Vancomycin and Zosyn dosing. Tmax 101.8, WBC up 19.3, and LA unresolved (last 6.3).   SCr is improving 3.12 >> 2.24. UOP at 1 cc/kg/hr currently.   Plan: The dose of Vancomycin will be adjusted to 1000mg  IV every 24 hours based on renal function.  No change to Zosyn dosing at this time.  Continue to monitor renal function, clinical status, and culture results.  Will check vancomycin trough levels at [facility] as appropriate.   Height: 5\' 8"  (172.7 cm) Weight: 225 lb (102.1 kg) IBW/kg (Calculated) : 63.9  Temp (24hrs), Avg:100.4 F (38 C), Min:99.1 F (37.3 C), Max:101.8 F (38.8 C)   Recent Labs Lab 10/31/16 1513 10/31/16 1524 10/31/16 1956 10/31/16 2228 11/01/16 0215 11/01/16 0650  WBC 14.2*  --   --   --  19.3*  --   CREATININE 3.77*  --  3.12* 2.61* 2.21* 2.24*  LATICACIDVEN  --  4.65* 5.3* 6.3*  --   --     Estimated Creatinine Clearance: 33.8 mL/min (A) (by C-G formula based on SCr of 2.24 mg/dL (H)).    No Known Allergies  Antimicrobials this admission: 4/24 Vanc>> 4/24 Zosyn>> 4/24 Ceftriaxone x1  Dose adjustments this admission:   Microbiology results: 4/24BCx: 1/2 GPC clusters (BCID MRSE) 4/24 UCx:  4/24 MRSA pcr negative 4/25 Resp viral panel: negative  Thank you for allowing pharmacy to be a part of this patient's care.  Sloan Leiter, PharmD, BCPS Clinical Pharmacist Clinical phone 11/01/2016 until 3:30 PM- [phone] After hours, please call [phone] 11/01/2016 2:29 PM

## 2016-11-01 NOTE — Progress Notes (Signed)
Patient transported to MRI with RN. During MRI pt moving, knocking against MRI machine with hands while images were being captured. PRN sedative ordered with no effect. Neurologist notified of difficulty obtaining clear MRI Images. MRI to be attempted at later time. Pt transported back to 75M. No complications. Vitals remain stable and mental status unchanged.

## 2016-11-01 NOTE — Procedures (Signed)
ELECTROENCEPHALOGRAM REPORT  Date of Study: 11/01/2016  Patient's Name: [name] MRN: [id number] Date of Birth: 1955/11/22  Referring Provider: Dr. Oren Binet  Clinical History: This is a 61 year old woman with encephalopathy.  Medications: levETIRAcetam (KEPPRA) 500 mg in sodium chloride 0.9 % 100 mL IVPB  hydrALAZINE (APRESOLINE) injection 20 mg  insulin aspart (novoLOG) injection 0-20 Units  pantoprazole (PROTONIX) injection 40 mg  piperacillin-tazobactam (ZOSYN) IVPB 3.375 g  potassium phosphate 40 mEq in dextrose 5 % 500 mL infusion  vancomycin (VANCOCIN) IVPB 1000 mg/200 mL premix   Technical Summary: A multichannel digital EEG recording measured by the international 10-20 system with electrodes applied with paste and impedances below 5000 ohms performed as portable with EKG monitoring in an awake and asleep patient.  Hyperventilation and photic stimulation were not performed.  The digital EEG was referentially recorded, reformatted, and digitally filtered in a variety of bipolar and referential montages for optimal display.   Description: The patient is awake and asleep during the recording. There is no clear posterior dominant rhythm. The background consists of a large amount of diffuse 4-6 Hz theta and 2-3 Hz delta slowing, at times with triphasic-like appearance. During drowsiness and sleep, there is an increase in theta and delta slowing of the background with poorly formed vertex waves seen.  Hyperventilation and photic stimulation were not performed.  There were no clear epileptiform discharges or electrographic seizures seen.    EKG lead showed sinus tachycardia.  Impression: This awake and asleep EEG is abnormal due to moderate diffuse slowing of the waking background, at times with triphasic-like appearance.  Clinical Correlation of the above findings indicates diffuse cerebral dysfunction that is non-specific in etiology and can be seen with  hypoxic/ischemic injury, toxic/metabolic encephalopathies, neurodegenerative disorders, or medication effect.  The absence of epileptiform discharges does not rule out a clinical diagnosis of epilepsy.  Clinical correlation is advised.   Ellouise Newer, M.D.

## 2016-11-01 NOTE — Progress Notes (Signed)
EEG completed; results pending.    

## 2016-11-01 NOTE — Progress Notes (Signed)
PHARMACY - PHYSICIAN COMMUNICATION CRITICAL VALUE ALERT - BLOOD CULTURE IDENTIFICATION (BCID)  Results for orders placed or performed during the hospital encounter of 10/31/16  Blood Culture ID Panel (Reflexed) (Collected: 10/31/2016  3:13 PM)  Result Value Ref Range   Enterococcus species NOT DETECTED NOT DETECTED   Listeria monocytogenes NOT DETECTED NOT DETECTED   Staphylococcus species DETECTED (A) NOT DETECTED   Staphylococcus aureus NOT DETECTED NOT DETECTED   Methicillin resistance DETECTED (A) NOT DETECTED   Streptococcus species NOT DETECTED NOT DETECTED   Streptococcus agalactiae NOT DETECTED NOT DETECTED   Streptococcus pneumoniae NOT DETECTED NOT DETECTED   Streptococcus pyogenes NOT DETECTED NOT DETECTED   Acinetobacter baumannii NOT DETECTED NOT DETECTED   Enterobacteriaceae species NOT DETECTED NOT DETECTED   Enterobacter cloacae complex NOT DETECTED NOT DETECTED   Escherichia coli NOT DETECTED NOT DETECTED   Klebsiella oxytoca NOT DETECTED NOT DETECTED   Klebsiella pneumoniae NOT DETECTED NOT DETECTED   Proteus species NOT DETECTED NOT DETECTED   Serratia marcescens NOT DETECTED NOT DETECTED   Haemophilus influenzae NOT DETECTED NOT DETECTED   Neisseria meningitidis NOT DETECTED NOT DETECTED   Pseudomonas aeruginosa NOT DETECTED NOT DETECTED   Candida albicans NOT DETECTED NOT DETECTED   Candida glabrata NOT DETECTED NOT DETECTED   Candida krusei NOT DETECTED NOT DETECTED   Candida parapsilosis NOT DETECTED NOT DETECTED   Candida tropicalis NOT DETECTED NOT DETECTED    Name of physician (or Provider) Contacted: Dr. Hetty Ely  Changes to prescribed antibiotics required: No change to antibiotics for now due to fever of unknown origin and leukocytosis. Infectious work-up still pending.   Brain Hilts 11/01/2016  11:02 AM

## 2016-11-01 NOTE — Progress Notes (Signed)
PULMONARY  / CRITICAL CARE MEDICINE  Name: [name] MRN: [id number] DOB: 1955-08-11    LOS: 33  REFERRING MD :  Dr. Sloan Leiter   CHIEF COMPLAINT: DKA   BRIEF PATIENT DESCRIPTION:  61 year old female with PMH of DM, HTN, chronic back pain, tobacco/cocaine abuse, CKD III, and recent ICH (right residual hemiparesis). Presents to ED on 4/24 after being found at home with altered mental status. Per report patient typically takes care of husband and today when home health nurse came to the house, patient was found on the couch with multiple episodes of incontinence. Upon arrival to the ED patient was non-verbal and unable to follow commands. WBC 14.2, Lactic Acid 4.65, Glucose 1200, and pH 7.2. PCCM was asked to consult.   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   LINES / TUBES: Urethral catheter 4/24 >>  Peripheral IV left antecubital, right wrist 4/24 >>   CULTURES: Blood 4/24 >> MRSA negative    ANTIBIOTICS: Vancomycin 4/24 >>  Zosyn 4/24 > Ceftriaxone x1   SIGNIFICANT EVENTS:  4/24 > Presents to ED, DKA CXR 4/24 > Slight bibasilar subsegmental atelectasis and mild vascular congestion  CT Head 4/24 > Brain atrophy and chronic white matter microvascular changes, no acute, resolution of remote left pontine hemorrhage  CT AP 4/24 > Descending and sigmoid diverticulosis without acute diverticulitis, bowel inflammation or obstruction, punctate non-obstructing calcifications associated with interpolar and lower pole of the right   LEVEL OF CARE:  Critical care  PRIMARY SERVICE: PCCM  CONSULTANTS: neurology CODE STATUS FULL  DIET: NPO  DVT Px: SCDs, subq heparin  GI Px: IV protonix   VITAL SIGNS: Temp:  [99.5 F (37.5 C)-101.8 F (38.8 C)] 99.5 F (37.5 C) (04/25 0725) Pulse Rate:  [101-129] 117 (04/25 0600) Resp:  [8-28] 24 (04/25 0600) BP: (122-193)/(73-160) 122/88 (04/25 0600) SpO2:  [93 %-99 %] 95 % (04/25  0600) Weight:  [225 lb (102.1 kg)] 225 lb (102.1 kg) (04/24 1702) HEMODYNAMICS:   VENTILATOR SETTINGS:   INTAKE / OUTPUT: Intake/Output      04/24 0701 - 04/25 0700 04/25 0701 - 04/26 0700   I.V. (mL/kg) 1132 (11.1)    IV Piggyback 3425    Total Intake(mL/kg) 4557 (44.6)    Urine (mL/kg/hr) 1175    Total Output 1175     Net +3382          Urine Occurrence 3 x      PHYSICAL EXAMINATION: General: NAD, well developed  Neuro: Awake, Alert, oriented x2 (knows name, says she is at [facility])  HEENT: MMM Cardiovascular:  RRR, no appreciated murmurs  Lungs: bilateral rhonchi, chest wall tenderness Abdomen: BS+, soft, non distended, diffuse tenderness to light palpation  Musculoskeletal: moving all extremities  Skin:  Intact, warm and dry    LABS: Cbc  Recent Labs Lab 10/31/16 1513 11/01/16 0215  WBC 14.2* 19.3*  HGB 16.0* 15.7*  HCT 52.2* 46.2*  PLT 270 258    Chemistry   Recent Labs Lab 10/31/16 1956 10/31/16 2228 11/01/16 0215  NA 151* 155* 161*  K 3.5 3.0* 2.8*  CL 114* 122* 128*  CO2 22 19* 23  BUN 46* 40* 35*  CREATININE 3.12* 2.61* 2.21*  CALCIUM 8.6* 8.3* 9.1  MG  --   --  2.3  PHOS  --   --  <1.0*  GLUCOSE 682* 337* 68    Liver fxn  Recent Labs Lab 10/31/16 1513  AST  20  ALT 16  ALKPHOS 69  BILITOT 1.4*  PROT 9.0*  ALBUMIN 3.4*   coags  Recent Labs Lab 10/31/16 1513  INR 1.09   Sepsis markers  Recent Labs Lab 10/31/16 1524 10/31/16 1956 10/31/16 2228 11/01/16 0215  LATICACIDVEN 4.65* 5.3* 6.3*  --   PROCALCITON  --  <0.10  --  <0.10   Cardiac markers No results for input(s): CKTOTAL, CKMB, TROPONINI in the last 168 hours. BNP No results for input(s): PROBNP in the last 168 hours. 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[facility])   Seizure ([place])   ARF (acute renal failure) (HCC)   Severe sepsis (HCC)   AKI (acute kidney injury) ([place])   ASSESSMENT / PLAN:  PULMONARY  ASSESSMENT:  No acute issues  PLAN:   Monitor for airway protection   CARDIOVASCULAR  ASSESSMENT:  HTN urgency at presentation, improving  Cocaine abuse  Tachycardic, rates improving at <120 Hx of HTN on lisinopril 10 mg qd, amlodipine 10 qd, HCTZ 12.5 mg qd, and metoprolol 25 mg BID at home  PLAN:  Hydral 20 mg PRN for SBP >160  Metoprolol 5 mg q4h PRN   RENAL  ASSESSMENT:   Hypokalemia, repleating  s/p 4 L NS bolus and 1 L NS with KCL, 0.5 L K phos in D5  Metabolic acidosis with anion gap  Lactic acid 4.65 > 5.3 > 6.3  Hypernatremia  Acute on chronic renal failure  PLAN:   Trending lactic acid  F/U BMP q4h On D5 KCl at 125 cc/hr   Avoid normal saline   GASTROINTESTINAL  ASSESSMENT:   No acute issue  PLAN:   UDS 4/24 >> IV protonix 40 qd   HEMATOLOGIC  ASSESSMENT:   Leukocytosis  PLAN:  Subq heparin for VTE prophylaxis   INFECTIOUS  ASSESSMENT:   tmax 101.8 overnight  procalcitonin <0.10 x2 PLAN:   F/U flu PCR  Trending procalcitonin  On vanc and zosyn   ENDOCRINE  ASSESSMENT:   Presented in diabetic DKA, was started on insulin gtt, gap closed x2 then given lantus 60 units and started on resistant sliding scale then gap reopened, it is now closed x1  On novolog 70/30 on 53 units qAM and 33 units qHS at home    Beta hydroxybutyric acid 1.62 PLAN:   Continue resistant sliding scale, may need to resume insulin gtt if gap does not remain closed   NEUROLOGIC  ASSESSMENT:   Acute encephalopathy Had left sided twitching in the setting of severe hyperglycemia which resolved with CBG less than 300.  Hx of seizures, n keppra 1000 mg BID at home Hx of cocaine abuse, positive UDS   On gabapentin 600  mg QID at home  On tramadol 50 mg PRN, trazodone 100 qHS at home  On flexeril 50 BID PRN at home, neuro advised to d/c this at discharge as it can lower seizure threshold  PLAN:   EEG this morning  F/U MRI brain  On IV keppra 500 mg BID  Fentanyl 25-50 mg PRN Consider adding back home gabapentin  Neuro is following    Pulmonary and [place] Pager: [phone]  11/01/2016, 8:09 AM

## 2016-11-01 NOTE — Progress Notes (Addendum)
Page sent out to [facility] to notify them of transfer to step down, they confirmed that they will receive this patient. PCCM signing off.

## 2016-11-01 NOTE — Progress Notes (Signed)
eLink Physician-Brief Progress Note Patient Name: [name] DOB: [birth year] MRN: [id number]   Date of Service  11/01/2016  HPI/Events of Note  Hypophos, hypoK, hyperNa  eICU Interventions  K phos X 40 mEq, IVFs changed to D5W + 20 mEq KCl     Intervention Category Major Interventions: Electrolyte abnormality - evaluation and management  Wilhelmina Mcardle 11/01/2016, 3:45 AM

## 2016-11-01 NOTE — Progress Notes (Addendum)
Anion gap 14 --> 10. CBG <250 x 2hrs. CO2 normal. Will start transition to SubQ insulin. Have ordered Lantus 30 units to be given now with 2 hr overlap of insulin infusion. Continue D5 during this time.   Hypernatremia: adjusting fluids Phos <1: Replacing with KPhos K 2.8: Replacing and fluids adjusted to D5 + 20 mEq KCl

## 2016-11-01 NOTE — Progress Notes (Signed)
Subjective: Continues to be encephalopathic. No further shaking.   Exam: Vitals:   11/01/16 0822 11/01/16 0900  BP: (!) 170/94 138/87  Pulse:    Resp:  18  Temp:     Gen: In bed, NAD Resp: non-labored breathing, no acute distress Abd: soft, nt  Neuro: MS: awaek, alert, answers simple questions and follows commands. Unable to give me place, month, or year. Unclear if yes/no answers are all aprrpriate, unabel to count fingers.  CN: reacts to visual stimuli in both hemifields.  Motor: MAEW Sensory:intact to LT  Pertinent Labs: Glucose 1200 yesterday 15:13, 222 at 7am Cr 2.24  Impression: 60 yo F with HONK with encephaloapthy. This can cause partial seizures, and the patient is being maintained on keppra 500mg  BID(decreased from home dose due to renal dysfunction). Her mental status is likely due to hyperosmolar state, but fever source is unclear to me. If no clear source of fever, I would recommend lumbar puncture to rule out CNS infection. I would repeat h  Recommendations: 1) EEG 2) MRI  3) keppra 500mg  BID 4) consider LP if no other fever source is detected.   Roland Rack, MD [facility] [phone]  If 7pm- 7am, please page neurology on call as listed in [place].

## 2016-11-02 ENCOUNTER — Inpatient Hospital Stay (HOSPITAL_COMMUNITY): Payer: Medicaid Other

## 2016-11-02 DIAGNOSIS — G039 Meningitis, unspecified: Secondary | ICD-10-CM | POA: Diagnosis present

## 2016-11-02 LAB — CBC
HCT: 43.5 % (ref 36.0–46.0)
Hemoglobin: 14.4 g/dL (ref 12.0–15.0)
MCH: 30.4 pg (ref 26.0–34.0)
MCHC: 33.1 g/dL (ref 30.0–36.0)
MCV: 91.8 fL (ref 78.0–100.0)
PLATELETS: 194 10*3/uL (ref 150–400)
RBC: 4.74 MIL/uL (ref 3.87–5.11)
RDW: 14.1 % (ref 11.5–15.5)
WBC: 19.6 10*3/uL — AB (ref 4.0–10.5)

## 2016-11-02 LAB — BASIC METABOLIC PANEL
ANION GAP: 10 (ref 5–15)
BUN: 22 mg/dL — ABNORMAL HIGH (ref 6–20)
BUN: 21 mg/dL — AB (ref 6–20)
CALCIUM: 8.6 mg/dL — AB (ref 8.9–10.3)
CO2: 19 mmol/L — ABNORMAL LOW (ref 22–32)
CO2: 20 mmol/L — ABNORMAL LOW (ref 22–32)
CREATININE: 2.04 mg/dL — AB (ref 0.44–1.00)
Calcium: 9 mg/dL (ref 8.9–10.3)
Chloride: >130 mmol/L (ref 101–111)
Chloride: 128 mmol/L — ABNORMAL HIGH (ref 101–111)
Creatinine, Ser: 1.97 mg/dL — ABNORMAL HIGH (ref 0.44–1.00)
GFR calc Af Amer: 30 mL/min — ABNORMAL LOW (ref >60)
GFR calc Af Amer: 31 mL/min — ABNORMAL LOW (ref >60)
GFR calc non Af Amer: 26 mL/min — ABNORMAL LOW (ref >60)
GFR, EST NON AFRICAN AMERICAN: 27 mL/min — AB (ref >60)
Glucose, Bld: 245 mg/dL — ABNORMAL HIGH (ref 65–99)
Glucose, Bld: 169 mg/dL — ABNORMAL HIGH (ref 65–99)
POTASSIUM: 3.2 mmol/L — AB (ref 3.5–5.1)
Potassium: 3.4 mmol/L — ABNORMAL LOW (ref 3.5–5.1)
SODIUM: 157 mmol/L — AB (ref 135–145)
SODIUM: 158 mmol/L — AB (ref 135–145)

## 2016-11-02 LAB — CSF CELL COUNT WITH DIFFERENTIAL
Lymphs, CSF: 7 % — ABNORMAL LOW (ref 40–80)
Lymphs, CSF: 8 % — ABNORMAL LOW (ref 40–80)
Monocyte-Macrophage-Spinal Fluid: 3 % — ABNORMAL LOW (ref 15–45)
Monocyte-Macrophage-Spinal Fluid: 4 % — ABNORMAL LOW (ref 15–45)
RBC COUNT CSF: 3 /mm3 — AB
RBC COUNT CSF: 4 /mm3 — AB
SEGMENTED NEUTROPHILS-CSF: 90 % — AB (ref 0–6)
Segmented Neutrophils-CSF: 88 % — ABNORMAL HIGH (ref 0–6)
Tube #: 4
WBC CSF: 24 /mm3 — AB (ref 0–5)
WBC, CSF: 22 /mm3 (ref 0–5)

## 2016-11-02 LAB — PROTEIN AND GLUCOSE, CSF
Glucose, CSF: 151 mg/dL — ABNORMAL HIGH (ref 40–70)
Total  Protein, CSF: 65 mg/dL — ABNORMAL HIGH (ref 15–45)

## 2016-11-02 LAB — GLUCOSE, CAPILLARY
GLUCOSE-CAPILLARY: 221 mg/dL — AB (ref 65–99)
GLUCOSE-CAPILLARY: 177 mg/dL — AB (ref 65–99)
Glucose-Capillary: 299 mg/dL — ABNORMAL HIGH (ref 65–99)
Glucose-Capillary: 266 mg/dL — ABNORMAL HIGH (ref 65–99)
Glucose-Capillary: 172 mg/dL — ABNORMAL HIGH (ref 65–99)
Glucose-Capillary: 233 mg/dL — ABNORMAL HIGH (ref 65–99)

## 2016-11-02 LAB — BLOOD GAS, ARTERIAL
ACID-BASE DEFICIT: 3.1 mmol/L — AB (ref 0.0–2.0)
BICARBONATE: 19.4 mmol/L — AB (ref 20.0–28.0)
Drawn by: 419771
FIO2: 0.21
O2 Saturation: 93.8 %
PATIENT TEMPERATURE: 98.6
PH ART: 7.526 — AB (ref 7.350–7.450)
pCO2 arterial: 23.5 mmHg — ABNORMAL LOW (ref 32.0–48.0)
pO2, Arterial: 64.5 mmHg — ABNORMAL LOW (ref 83.0–108.0)

## 2016-11-02 LAB — URINE CULTURE: Culture: NO GROWTH

## 2016-11-02 LAB — PHOSPHORUS: Phosphorus: 1.4 mg/dL — ABNORMAL LOW (ref 2.5–4.6)

## 2016-11-02 LAB — MAGNESIUM: Magnesium: 2.1 mg/dL (ref 1.7–2.4)

## 2016-11-02 LAB — PROCALCITONIN: Procalcitonin: 0.11 ng/mL

## 2016-11-02 MED ORDER — DEXTROSE 5 % IV SOLN
10.0000 mg/kg | Freq: Two times a day (BID) | INTRAVENOUS | Status: DC
  Administered 2016-11-02 – 2016-11-03 (×3): 790 mg via INTRAVENOUS
  Filled 2016-11-02 (×4): qty 15.8

## 2016-11-02 MED ORDER — SODIUM CHLORIDE 0.45 % IV SOLN
INTRAVENOUS | Status: DC
  Administered 2016-11-02 – 2016-11-04 (×3): via INTRAVENOUS

## 2016-11-02 MED ORDER — WHITE PETROLATUM GEL
Status: AC
  Administered 2016-11-02: 09:00:00
  Filled 2016-11-02: qty 1

## 2016-11-02 MED ORDER — LORAZEPAM 2 MG/ML IJ SOLN
1.0000 mg | Freq: Once | INTRAMUSCULAR | Status: AC
  Administered 2016-11-02: 1 mg via INTRAVENOUS

## 2016-11-02 MED ORDER — INSULIN GLARGINE 100 UNIT/ML ~~LOC~~ SOLN
30.0000 [IU] | Freq: Two times a day (BID) | SUBCUTANEOUS | Status: DC
  Administered 2016-11-02 – 2016-11-06 (×8): 30 [IU] via SUBCUTANEOUS
  Filled 2016-11-02 (×9): qty 0.3

## 2016-11-02 MED ORDER — CEFTRIAXONE SODIUM 2 G IJ SOLR
2.0000 g | Freq: Two times a day (BID) | INTRAMUSCULAR | Status: DC
  Administered 2016-11-02 – 2016-11-06 (×8): 2 g via INTRAVENOUS
  Filled 2016-11-02 (×8): qty 2

## 2016-11-02 MED ORDER — DEXTROSE 5 % IV SOLN
10.0000 mg/kg | Freq: Two times a day (BID) | INTRAVENOUS | Status: DC
  Filled 2016-11-02: qty 20.4

## 2016-11-02 MED ORDER — ASPIRIN 81 MG PO CHEW
81.0000 mg | CHEWABLE_TABLET | Freq: Every day | ORAL | Status: DC
  Administered 2016-11-03 – 2016-11-06 (×4): 81 mg via ORAL
  Filled 2016-11-02 (×4): qty 1

## 2016-11-02 MED ORDER — ATORVASTATIN CALCIUM 40 MG PO TABS
40.0000 mg | ORAL_TABLET | Freq: Every day | ORAL | Status: DC
Start: 2016-11-02 — End: 2016-11-06
  Administered 2016-11-03 – 2016-11-05 (×3): 40 mg via ORAL
  Filled 2016-11-02 (×4): qty 1

## 2016-11-02 MED ORDER — HEPARIN SODIUM (PORCINE) 5000 UNIT/ML IJ SOLN
5000.0000 [IU] | Freq: Three times a day (TID) | INTRAMUSCULAR | Status: DC
  Administered 2016-11-02 – 2016-11-05 (×8): 5000 [IU] via SUBCUTANEOUS
  Filled 2016-11-02 (×10): qty 1

## 2016-11-02 MED ORDER — LORAZEPAM 2 MG/ML IJ SOLN
INTRAMUSCULAR | Status: AC
  Administered 2016-11-02: 1 mg via INTRAVENOUS
  Filled 2016-11-02: qty 1

## 2016-11-02 MED ORDER — LORAZEPAM 2 MG/ML IJ SOLN
INTRAMUSCULAR | Status: AC
  Administered 2016-11-02: 2 mg
  Filled 2016-11-02: qty 1

## 2016-11-02 NOTE — Progress Notes (Signed)
Subjective: Continues to be encephaloapthic, but improving. Continues to have fever without source.    Exam: Vitals:   11/02/16 0600 11/02/16 0700  BP: (!) 174/76 (!) 163/72  Pulse: (!) 105 96  Resp: (!) 26 17  Temp:     Gen: In bed, NAD Resp: non-labored breathing, no acute distress Abd: soft, nt  Neuro: MS: awaek, alert, answers simple questions and follows commands. Unable to give me place, month, or year. Unclear if yes/no answers are all aprrpriate, unabel to count fingers.  CN: reacts to visual stimuli in both hemifields.  Motor: MAEW Sensory:intact to LT   Impression: 61 yo F with HONK with encephaloapthy. This can cause partial seizures, and the patient is being maintained on keppra 500mg  BID(decreased from home dose due to renal dysfunction). Her mental status is likely due to hyperosmolar state and is improving, but fever source is unclear to me.   With no clear source of fever and continued AMS< I do think LP is indicated. Will perform today.   Recommendations:  1) keppra 500mg  BID 2) LP with cells, glucose, protein, hsv 3) will follow  Roland Rack, MD [facility] [phone]  If 7pm- 7am, please page neurology on call as listed in [place].

## 2016-11-02 NOTE — Progress Notes (Signed)
CRITICAL VALUE ALERT  Critical value received: WBC in CSF tube 1 and tube 4  Date of notification:  11/02/16  Time of notification:  6153  Critical value read back: yes  Nurse who received alert: I Jearld Adjutant RN  MD notified (1st page): Dr COurage  Time of first page:  [phone]  Responding MD:  Courage  Time MD responded:  [phone]

## 2016-11-02 NOTE — Progress Notes (Signed)
CRITICAL VALUE ALERT  Critical value received:  Chloride >130  Date of notification:  11/02/16  Time of notification:  7034  Critical value read back:Yes.    Nurse who received alert:  Woodroe Chen  MD notified (1st page):  MD Molt  Time of first page:  [phone]  Responding MD:  [id number]

## 2016-11-02 NOTE — Progress Notes (Addendum)
Patient Demographics:    [name], is a 61 y.o. female, DOB - [birth year], CBJ:[id number]  Admit date - 10/31/2016   Admitting Physician Rush Landmark, MD  Outpatient Primary MD for the patient is Philis Fendt, MD  LOS - 2   Chief Complaint  Patient presents with  . Altered Mental Status        Subjective:    [name] today has no fevers, no emesis,  No chest pain,  Talking more, awake, eating okay,    Assessment  & Plan :    Principal Problem:   Meningitis Active Problems:   Essential hypertension   GERD   Pontine hemorrhage (HCC)   Seizure (HCC)   ARF (acute renal failure) (HCC)   Severe sepsis (HCC)   DKA (diabetic ketoacidoses) (HCC)   AKI (acute kidney injury) ([place])  Brief summary BRIEF PATIENT DESCRIPTION:  61 year old female with PMH of DM, HTN, chronic back pain, tobacco/cocaine abuse, CKD III, and recent ICH (right residual hemiparesis). Presents to ED on 4/24 after being found at home with altered mental status. Per report patient typically takes care of husband and today when home health nurse came to the house, patient was found on the couch with multiple episodes of incontinence. Upon arrival to the ED patient was non-verbal and unable to follow commands. WBC 14.2, Lactic Acid 4.65, Glucose 1200, and pH 7.2. PCCM was asked to consult. 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   Brain MRI from 11/02/16  Small acute lacunar infarct along the confluence of the right globus pallidus  LP on 11/02/16 - CSF fluid from 11/02/16 With 22 to 24 WBC and only 3 to 4 RBC in 1st  and 4th bottles, protein is elevated, glucose is comparable to a serum glucose  EEG on 4/25/128 w/o Sz  1)Viral Vs Bacterial meningitis- Clinically I believe meningitis was present on admission, lumbar puncture was done on 11/02/2016  due to concerns by leukocytosis, altered mentation and fevers , discussed with Dr. Leonel Ramsay the neurologist, start IV acyclovir (pharmacy to dose), IV Rocephin 2 g every 12 hours and iv Vancomycin pending final cultures and sensitivity. Droplet precautions .  Please get infectious disease consult once culture results are available to guide length and choice of antimicrobial therapy, please see LP result above  2)Acute Lacunar infarct- no significant focal deficits, discussed with neurologist Dr. Leonel Ramsay, give aspirin and Lipitor (h/o prior hemorrhage)  3)DM- patient was admitted with uncontrolled hyperglycemia/DKA, unable to calculate anion gap as chloride is above 130 and not measurable, bicarbonate is 19, change fluids to half-normal saline and repeat BMP tonight and adjust fluid rate and volume accordingly. Lantus insulin as ordered, Use Novolog/Humalog Sliding scale insulin with Accu-Cheks/Fingersticks as ordered    4)FEN- Electrolyte abnormalities noted including hypernatremia,.  hyperchloremia and hypokalemia, IV fluid change as above in #3  5)Aki on CKD- creatinine peaked at 3.77, creatinine is down to tonight, continue to hydrate, continue to avoid nephrotoxic agent, pharmacy to dose acyclovir and vancomycin dosage based on renal function  Disposition Plan  : to be determined, pt needs isolation for possible meningitis  Consults  :  Neurology/PCCM, please call for ID consult  once culture results are available  DVT Prophylaxis  :Heparin -    Lab Results  Component Value Date   PLT 194 11/02/2016    Inpatient Medications  Scheduled Meds: . aspirin  81 mg Oral Daily  . atorvastatin  40 mg Oral q1800  . chlorhexidine  15 mL Mouth Rinse BID  . heparin  5,000 Units Subcutaneous Q8H  . insulin aspart  0-20 Units Subcutaneous Q4H  . insulin glargine  30 Units Subcutaneous BID  . mouth rinse  15 mL Mouth Rinse q12n4p  . pantoprazole (PROTONIX) IV  40 mg Intravenous Q24H    Continuous Infusions: . sodium chloride    . acyclovir    . cefTRIAXone (ROCEPHIN)  IV    . dextrose 50 mL/hr at 11/02/16 1039  . levETIRAcetam Stopped (11/02/16 0908)  . vancomycin Stopped (11/01/16 2052)   PRN Meds:.dextrose, hydrALAZINE    Anti-infectives    Start     Dose/Rate Route Frequency Ordered Stop   11/02/16 2200  cefTRIAXone (ROCEPHIN) 2 g in dextrose 5 % 50 mL IVPB     2 g 100 mL/hr over 30 Minutes Intravenous Every 12 hours 11/02/16 1820     11/02/16 1930  acyclovir (ZOVIRAX) 1,020 mg in dextrose 5 % 250 mL IVPB    Comments:  Pharmacy consult... 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   10 mg/kg  102.1 kg 270.4 mL/hr over 60 Minutes Intravenous Every 12 hours 11/02/16 1821     11/02/16 1800  vancomycin (VANCOCIN) IVPB 1000 mg/200 mL premix  Status:  Discontinued     1,000 mg 200 mL/hr over 60 Minutes Intravenous Every 48 hours 10/31/16 1845 11/01/16 1429   11/01/16 1900  vancomycin (VANCOCIN) IVPB 1000 mg/200 mL premix     1,000 mg 200 mL/hr over 60 Minutes Intravenous Every 24 hours 11/01/16 1429     11/01/16 1500  cefTRIAXone (ROCEPHIN) 1 g in dextrose 5 % 50 mL IVPB  Status:  Discontinued     1 g 100 mL/hr over 30 Minutes Intravenous Every 24 hours 10/31/16 1545 10/31/16 1830   11/01/16 0200  piperacillin-tazobactam (ZOSYN) IVPB 3.375 g  Status:  Discontinued     3.375 g 12.5 mL/hr over 240 Minutes Intravenous Every 8 hours 10/31/16 1845 11/02/16 1836   10/31/16 1830  vancomycin (VANCOCIN) 2,000 mg in sodium chloride 0.9 % 500 mL IVPB     2,000 mg 250 mL/hr over 120 Minutes Intravenous  Once 10/31/16 1824 10/31/16 2052   10/31/16 1830  piperacillin-tazobactam (ZOSYN) IVPB 3.375 g     3.375 g 100 mL/hr over 30 Minutes Intravenous  Once 10/31/16 1824 10/31/16 1939   10/31/16 1545  cefTRIAXone (ROCEPHIN) 2 g in dextrose 5 % 50 mL IVPB     2 g 100 mL/hr over 30 Minutes Intravenous  Once 10/31/16 1537 10/31/16 1618        Objective:   Vitals:   11/02/16 1400 11/02/16  1500 11/02/16 1600 11/02/16 1700  BP: (!) 172/69 (!) 173/93 (!) 158/86 (!) 145/88  Pulse: (!) 103 (!) 114 (!) 105 94  Resp: 17 (!) 21 (!) 22 19  Temp:   98 F (36.7 C)   TempSrc:   Oral   SpO2: 98% 98% 98% 96%  Weight:      Height:        Wt Readings from Last 3 Encounters:  10/31/16 102.1 kg (225 lb)  11/02/16 102.1 kg (225 lb)  06/14/16 94.2 kg (207 lb 10.8 oz)     Intake/Output  Summary (Last 24 hours) at 11/02/16 1843 Last data filed at 11/02/16 1300  Gross per 24 hour  Intake             1460 ml  Output              100 ml  Net             1360 ml     Physical Exam  Gen:- Awake Alert,  More awake, less lethargy, HEENT:- [place].AT, No sclera icterus Neck- patient denies significant neck pain or stiffness at this time, Lungs-  CTAB  CV- S1, S2 normal Abd-  +ve B.Sounds, Abd Soft, No tenderness,    Extremity/Skin:- Neg homan's     Data Review:   Micro Results Recent Results (from the past 240 hour(s))  Culture, blood (Routine x 2)     Status: Abnormal (Preliminary result)   Collection Time: 10/31/16  3:13 PM  Result Value Ref Range Status   Specimen Description BLOOD LEFT HAND  Final   Special Requests   Final    BOTTLES DRAWN AEROBIC AND ANAEROBIC Blood Culture adequate volume   Culture  Setup Time   Final    GRAM POSITIVE COCCI IN CLUSTERS IN BOTH AEROBIC AND ANAEROBIC BOTTLES CRITICAL RESULT CALLED TO, READ BACK BY AND VERIFIED WITH: PHARMD M [place] [id number] MLM    Culture (A)  Final    STAPHYLOCOCCUS SPECIES (COAGULASE NEGATIVE) THE SIGNIFICANCE OF ISOLATING THIS ORGANISM FROM A SINGLE SET OF BLOOD CULTURES WHEN MULTIPLE SETS ARE DRAWN IS UNCERTAIN. PLEASE NOTIFY THE MICROBIOLOGY DEPARTMENT WITHIN ONE WEEK IF SPECIATION AND SENSITIVITIES ARE REQUIRED.    Report Status PENDING  Incomplete  Blood Culture ID Panel (Reflexed)     Status: Abnormal   Collection Time: 10/31/16  3:13 PM  Result Value Ref Range Status   Enterococcus species NOT DETECTED NOT  DETECTED Final   Listeria monocytogenes NOT DETECTED NOT DETECTED Final   Staphylococcus species DETECTED (A) NOT DETECTED Final    Comment: Methicillin (oxacillin) resistant coagulase negative staphylococcus. Possible blood culture contaminant (unless isolated from more than one blood culture draw or clinical case suggests pathogenicity). No antibiotic treatment is indicated for blood  culture contaminants. CRITICAL RESULT CALLED TO, READ BACK BY AND VERIFIED WITH: PHARMD M [place] [id number] MLM    Staphylococcus aureus NOT DETECTED NOT DETECTED Final   Methicillin resistance DETECTED (A) NOT DETECTED Final    Comment: CRITICAL RESULT CALLED TO, READ BACK BY AND VERIFIED WITH: PHARMD M [place] [id number] MLM    Streptococcus species NOT DETECTED NOT DETECTED Final   Streptococcus agalactiae NOT DETECTED NOT DETECTED Final   Streptococcus pneumoniae NOT DETECTED NOT DETECTED Final   Streptococcus pyogenes NOT DETECTED NOT DETECTED Final   Acinetobacter baumannii NOT DETECTED NOT DETECTED Final   Enterobacteriaceae species NOT DETECTED NOT DETECTED Final   Enterobacter cloacae complex NOT DETECTED NOT DETECTED Final   Escherichia coli NOT DETECTED NOT DETECTED Final   Klebsiella oxytoca NOT DETECTED NOT DETECTED Final   Klebsiella pneumoniae NOT DETECTED NOT DETECTED Final   Proteus species NOT DETECTED NOT DETECTED Final   Serratia marcescens NOT DETECTED NOT DETECTED Final   Haemophilus influenzae NOT DETECTED NOT DETECTED Final   Neisseria meningitidis NOT DETECTED NOT DETECTED Final   Pseudomonas aeruginosa NOT DETECTED NOT DETECTED Final   Candida albicans NOT DETECTED NOT DETECTED Final   Candida glabrata NOT DETECTED NOT DETECTED Final   Candida krusei NOT DETECTED NOT DETECTED Final  Candida parapsilosis NOT DETECTED NOT DETECTED Final   Candida tropicalis NOT DETECTED NOT DETECTED Final  Culture, blood (Routine x 2)     Status: None (Preliminary result)   Collection Time:  10/31/16  3:27 PM  Result Value Ref Range Status   Specimen Description BLOOD LEFT ANTECUBITAL  Final   Special Requests IN PEDIATRIC BOTTLE Blood Culture adequate volume  Final   Culture NO GROWTH 2 DAYS  Final   Report Status PENDING  Incomplete  Culture, Urine     Status: None   Collection Time: 10/31/16  7:43 PM  Result Value Ref Range Status   Specimen Description URINE, CATHETERIZED  Final   Special Requests NONE  Final   Culture NO GROWTH  Final   Report Status 11/02/2016 FINAL  Final  MRSA PCR Screening     Status: None   Collection Time: 10/31/16  9:24 PM  Result Value Ref Range Status   MRSA by PCR NEGATIVE NEGATIVE Final    Comment:        The GeneXpert MRSA Assay (FDA approved for NASAL specimens only), is one component of a comprehensive MRSA colonization surveillance program. It is not intended to diagnose MRSA infection nor to guide or monitor treatment for MRSA infections.   Respiratory Panel by PCR     Status: None   Collection Time: 11/01/16 10:03 AM  Result Value Ref Range Status   Adenovirus NOT DETECTED NOT DETECTED Final   Coronavirus 229E NOT DETECTED NOT DETECTED Final   Coronavirus HKU1 NOT DETECTED NOT DETECTED Final   Coronavirus NL63 NOT DETECTED NOT DETECTED Final   Coronavirus OC43 NOT DETECTED NOT DETECTED Final   Metapneumovirus NOT DETECTED NOT DETECTED Final   Rhinovirus / Enterovirus NOT DETECTED NOT DETECTED Final   Influenza A NOT DETECTED NOT DETECTED Final   Influenza B NOT DETECTED NOT DETECTED Final   Parainfluenza Virus 1 NOT DETECTED NOT DETECTED Final   Parainfluenza Virus 2 NOT DETECTED NOT DETECTED Final   Parainfluenza Virus 3 NOT DETECTED NOT DETECTED Final   Parainfluenza Virus 4 NOT DETECTED NOT DETECTED Final   Respiratory Syncytial Virus NOT DETECTED NOT DETECTED Final   Bordetella pertussis NOT DETECTED NOT DETECTED Final   Chlamydophila pneumoniae NOT DETECTED NOT DETECTED Final   Mycoplasma pneumoniae NOT DETECTED  NOT DETECTED Final    Radiology Reports Ct Abdomen Pelvis Wo Contrast  Result Date: 10/31/2016 CLINICAL DATA:  Sepsis. Patient unable to tolerate oral contrast. Multiple episodes of incontinence. EXAM: CT ABDOMEN AND PELVIS WITHOUT CONTRAST TECHNIQUE: Multidetector CT imaging of the abdomen and pelvis was performed following the standard protocol without IV contrast. COMPARISON:  06/21/2008 FINDINGS: Lower chest: Atelectasis and/or scarring in both lower lobes and right middle lobe. Normal visualized cardiac chambers size without pericardial effusion. Aortic atherosclerosis along the descending thoracic aorta. Small hiatal hernia. Hepatobiliary: No focal liver abnormality is seen. No gallstones, gallbladder wall thickening, or biliary dilatation. Pancreas: Unremarkable. No pancreatic ductal dilatation or surrounding inflammatory changes. Spleen: Normal in size without focal abnormality. Adrenals/Urinary Tract: Normal bilateral adrenal glands. No obstructive uropathy. Renovascular calcifications are noted of the right renal hilum. Punctate 2 mm calcifications in the interpolar and lower pole of the right kidney may either represent nonobstructing renal calculi or vascular calcifications. No hydroureteronephrosis. Urinary bladder is unremarkable. Stomach/Bowel: Stomach is within normal limits. Appendix appears normal. No evidence of bowel wall thickening, distention, or inflammatory changes. Descending and sigmoid diverticulosis is again noted. No evidence of acute diverticulitis. Vascular/Lymphatic: Aortic and  bi-iliac-femoral atherosclerosis. No enlarged abdominal or pelvic lymph nodes. Reproductive: Uterus and bilateral adnexa are unremarkable. Other: No abdominal wall hernia or abnormality. No abdominopelvic ascites. Musculoskeletal: Mild vacuum disc phenomena L3-4 and L4-5. No acute nor suspicious osseous abnormalities. Mild levoconvex curvature of the lumbar spine possibly positional. Slight joint space  narrowing of both hips. Sclerosis of the pubic symphysis likely degenerative. IMPRESSION: 1. Descending and sigmoid diverticulosis without acute diverticulitis, bowel inflammation or obstruction. 2. Punctate nonobstructing calcifications associated with the interpolar and lower pole of the right kidney. Nephrolithiasis versus vascular calcifications. 3. No acute intraabdominal nor pelvic abnormality. Electronically Signed   By: Ashley Royalty M.D.   On: 10/31/2016 18:41   Ct Head Wo Contrast  Result Date: 10/31/2016 CLINICAL DATA:  Altered mental status, incontinent, remote strokes, diabetes, hypertension EXAM: CT HEAD WITHOUT CONTRAST TECHNIQUE: Contiguous axial images were obtained from the base of the skull through the vertex without intravenous contrast. COMPARISON:  06/07/2016, 06/06/2016 FINDINGS: Brain: Stable brain atrophy and chronic white matter microvascular ischemic changes throughout the cerebral hemispheres. No acute intracranial hemorrhage, definite infarction, mass lesion, midline shift, herniation, hydrocephalus, or extra-axial fluid collection. Cisterns remain patent. No cerebellar abnormality. 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 Vascular: Atherosclerosis of the intracranial vessels at the skullbase. Skull: Normal. Negative for fracture or focal lesion. Sinuses/Orbits: No acute finding. Other: None. IMPRESSION: Brain atrophy and chronic white matter microvascular changes. No acute intracranial abnormality by noncontrast CT. Resolved remote left pontine brainstem hemorrhage compare to 06/07/2016. Electronically Signed   By: Jerilynn Mages.  Shick M.D.   On: 10/31/2016 17:10   Mr Brain Wo Contrast  Result Date: 11/02/2016 CLINICAL DATA:  61 year old female found down with confusion. Prior small vessel disease including brainstem hemorrhage in 2017. Questionable acute lacunar infarct of the medial right basal ganglia on limited study yesterday. EXAM: MRI HEAD WITHOUT CONTRAST TECHNIQUE:  Multiplanar, multiecho pulse sequences of the brain and surrounding structures were obtained without intravenous contrast. COMPARISON:  Brain MRI 11/01/2016 and earlier. FINDINGS: Brain: Subcentimeter focus of restricted diffusion again suspected at the right deep gray matter nuclei, epicenter at the globus pallidus near the junction with the anterior right thalamus and internal capsule (series 4, image 27). No associated hemorrhage or mass effect. Mild if any associated T2 and FLAIR hyperintensity. No other restricted diffusion. Expected evolution of the pontine hemorrhage since November with hemosiderin and encephalomalacia. Abnormal T2 and FLAIR hyperintensity has developed in the bilateral middle cerebellar peduncles. Underlying chronic right pontine lacune. Chronic heterogeneity in the left thalamus is stable. Cerebral hemisphere gray and white matter signal appears stable and within normal limits. No midline shift, mass effect, evidence of mass lesion, ventriculomegaly, extra-axial collection or acute intracranial hemorrhage. Cervicomedullary junction and pituitary are within normal limits. Vascular: Major intracranial vascular flow voids are stable since 2017. Skull and upper cervical spine: Anterior cervical bulky osteophytosis. Visualized bone marrow signal is within normal limits. Sinuses/Orbits: Stable and negative. Other: Visible internal auditory structures appear normal. Mastoids remain clear. Stable posterior inferior left parotid space soft tissue nodule measuring 21 mm since 2017 (series 3, image 24). IMPRESSION: 1. Small acute lacunar infarct along the confluence of the right globus pallidus. No associated hemorrhage or mass effect. 2. Expected evolution of the left brainstem hemorrhage since November. 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 3. 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 4. 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  ENT follow-up again recommended. Electronically Signed   By: Genevie Ann M.D.   On: 11/02/2016 12:47   Mr Brain Wo Contrast  Result Date: 11/01/2016 CLINICAL DATA:  Altered mental status.  Confusion. EXAM: MRI HEAD WITHOUT CONTRAST TECHNIQUE: Multiplanar, multiecho pulse sequences of the brain and surrounding structures were obtained without intravenous contrast. COMPARISON:  Head CT 10/31/2016.  MRI 06/07/2016. FINDINGS: Brain: The study suffers from significant motion degradation. There has been an old hemorrhagic infarction in the left pons. There is an old small vessel infarction in the right pons. There is mild edema or gliosis in the right pons and middle cerebellar peduncle. This probably relates to an old insult but I cannot completely rule out a subacute stroke. There is an acute or subacute infarction in the medial basal ganglia on the right. No other acute insult. Old small vessel infarction left thalamus. Cerebral hemispheres do not show any other abnormal finding. No hydrocephalus or extra-axial collection. Vascular: Major vessels at the base of the brain show flow. Skull and upper cervical spine: Negative Sinuses/Orbits: Clear/normal Other: None IMPRESSION: Markedly motion degraded exam. Suspicion of an acute infarction at the medial right basal ganglia. Old hemorrhage/hemorrhagic infarction left pons. Old small vessel infarction right pons. Question mild edema in the right pons and middle cerebellar peduncle. There could be a subacute insult in this region. Old left thalamic small vessel infarction. Electronically Signed   By: Nelson Chimes M.D.   On: 11/01/2016 15:09   Dg Chest Port 1 View  Result Date: 10/31/2016 CLINICAL DATA:  Altered mental status today. History of hypertension and diabetes. Shortness of breath. History of tobacco and cocaine abuse. EXAM: PORTABLE CHEST 1 VIEW COMPARISON:  06/10/2016. FINDINGS: Normal  cardiomediastinal silhouette for the degree of inspiration. Slight bibasilar subsegmental atelectasis and mild vascular congestion. No active infiltrates or failure. No effusion or pneumothorax. Bones unremarkable. IMPRESSION: Stable chest radiograph.  No overt failure or consolidation. Electronically Signed   By: Staci Righter M.D.   On: 10/31/2016 16:06     CBC  Recent Labs Lab 10/31/16 1513 11/01/16 0215 11/02/16 0320  WBC 14.2* 19.3* 19.6*  HGB 16.0* 15.7* 14.4  HCT 52.2* 46.2* 43.5  PLT 270 258 194  MCV 100.6* 91.1 91.8  MCH 30.8 31.0 30.4  MCHC 30.7 34.0 33.1  RDW 14.4 13.6 14.1  LYMPHSABS 1.7  --   --   MONOABS 0.7  --   --   EOSABS 0.0  --   --   BASOSABS 0.0  --   --     Chemistries   Recent Labs Lab 10/31/16 1513  10/31/16 2228 11/01/16 0215 11/01/16 0650 11/01/16 1329 11/02/16 0320  NA 141  < > 155* 161* 158* 156*  157* 157*  K 5.1  < > 3.0* 2.8* 3.6 3.8  3.9 3.4*  CL 98*  < > 122* 128* 125* 129*  129* >130*  CO2 18*  < > 19* 23 20* 18*  17* 19*  GLUCOSE 1,200*  < > 337* 68 212* 313*  325* 245*  BUN 52*  < > 40* 35* 29* 25*  25* 22*  CREATININE 3.77*  < > 2.61* 2.21* 2.24* 2.14*  2.17* 2.04*  CALCIUM 9.6  < > 8.3* 9.1 8.6* 8.4*  8.5* 8.6*  MG  --   --   --  2.3  --   --  2.1  AST 20  --   --   --   --   --   --  ALT 16  --   --   --   --   --   --   ALKPHOS 69  --   --   --   --   --   --   BILITOT 1.4*  --   --   --   --   --   --   < > = values in this interval not displayed. ------------------------------------------------------------------------------------------------------------------ No results for input(s): CHOL, HDL, LDLCALC, TRIG, CHOLHDL, LDLDIRECT in the last 72 hours.  Lab Results  Component Value Date   HGBA1C 11.4 (H) 06/06/2016   ------------------------------------------------------------------------------------------------------------------ No results for input(s): TSH, T4TOTAL, T3FREE, THYROIDAB in the last 72  hours.  Invalid input(s): FREET3 ------------------------------------------------------------------------------------------------------------------ No results for input(s): VITAMINB12, FOLATE, FERRITIN, TIBC, IRON, RETICCTPCT in the last 72 hours.  Coagulation profile  Recent Labs Lab 10/31/16 1513  INR 1.09    No results for input(s): DDIMER in the last 72 hours.  Cardiac Enzymes No results for input(s): CKMB, TROPONINI, MYOGLOBIN in the last 168 hours.  Invalid input(s): CK ------------------------------------------------------------------------------------------------------------------ No results found for: BNP   Zakyah Yanes M.D on 11/02/2016 at 6:43 PM  Between 7am to 7pm - Pager - [phone]  After 7pm go to [email or web address] - password [email or web address]  [facility] -  Office  [phone]  Dragon dictation system was used to create this note, attempts have been made to correct errors, however presence of uncorrected errors is not a reflection quality of care provided

## 2016-11-02 NOTE — Procedures (Signed)
Indication: evaluate for infection  Risks of the procedure were unable to be discussed with family as no one was able to be contacted. Son was attempted 3 times without ability to get a hold of him.  Procedure was deemed emergent.  The patient was prepped and draped, and using sterile technique a 20 gauge quinke spinal needle.The needle was inserted at the L5-S1 space, though bony resistance was met repeatedly. It was then inserted in the L4-5 space and after several attempts meeting bony resistance, clear CSF was obtained. Opening pressure was 11 cm H20 and 8 cc clear CSF was obtained.   No complications encountered.   Roland Rack, MD [facility] [phone]  If 7pm- 7am, please page neurology on call as listed in [place].

## 2016-11-02 NOTE — Progress Notes (Signed)
SLP Cancellation Note  Patient Details Name: [name] MRN: [id number] DOB: 1955/07/31   Cancelled treatment:       Reason Eval/Treat Not Completed: Patient at procedure or test/unavailable   Jennifr Gaeta, Katherene Ponto 11/02/2016, 2:30 PM

## 2016-11-02 NOTE — Progress Notes (Signed)
Pharmacy Antibiotic Note  [name] is a 61 y.o. female admitted on 10/31/2016 with sepsis and possible meningitis.  Pharmacy has been consulted to acyclovir.  Patient's BMI is greater than 30; therefore, will use adjusted body weight for acyclovir dosing.  She also has reduced renal clearance.  Plan: - Acyclovir 790mg  IV Q12H (~10 mg/kg AdjBW) - Monitor renal fxn, clinical progress   Height: 5\' 8"  (172.7 cm) Weight: 225 lb (102.1 kg) IBW/kg (Calculated) : 63.9  Temp (24hrs), Avg:98.2 F (36.8 C), Min:97.4 F (36.3 C), Max:98.9 F (37.2 C)   Recent Labs Lab 10/31/16 1513 10/31/16 1524 10/31/16 1956 10/31/16 2228 11/01/16 0215 11/01/16 0650 11/01/16 1329 11/02/16 0320  WBC 14.2*  --   --   --  19.3*  --   --  19.6*  CREATININE 3.77*  --  3.12* 2.61* 2.21* 2.24* 2.14*  2.17* 2.04*  LATICACIDVEN  --  4.65* 5.3* 6.3*  --   --   --   --     Estimated Creatinine Clearance: 37.1 mL/min (A) (by C-G formula based on SCr of 2.04 mg/dL (H)).    No Known Allergies    4/24 Vanc >>  4/24 Zosyn >>  4/24 Ceftriaxone x1 Acyclovir 4/26   4/24 BCx: 1/2 GPC clusters (BCID MRSE) 4/24 UCx:  4/24 MRSA pcr negative 4/25 Resp viral panel: negative   [employer] D. Mina Marble, PharmD, BCPS Pager:  [phone] 11/02/2016, 7:03 PM

## 2016-11-02 NOTE — Progress Notes (Signed)
Patient taken off droplet precaution due to negative RSV panel and negative flu screening.

## 2016-11-03 DIAGNOSIS — G039 Meningitis, unspecified: Secondary | ICD-10-CM

## 2016-11-03 DIAGNOSIS — N183 Chronic kidney disease, stage 3 (moderate): Secondary | ICD-10-CM

## 2016-11-03 DIAGNOSIS — I613 Nontraumatic intracerebral hemorrhage in brain stem: Secondary | ICD-10-CM

## 2016-11-03 DIAGNOSIS — E870 Hyperosmolality and hypernatremia: Secondary | ICD-10-CM

## 2016-11-03 DIAGNOSIS — F1010 Alcohol abuse, uncomplicated: Secondary | ICD-10-CM

## 2016-11-03 DIAGNOSIS — K118 Other diseases of salivary glands: Secondary | ICD-10-CM

## 2016-11-03 DIAGNOSIS — F17210 Nicotine dependence, cigarettes, uncomplicated: Secondary | ICD-10-CM

## 2016-11-03 DIAGNOSIS — F1410 Cocaine abuse, uncomplicated: Secondary | ICD-10-CM

## 2016-11-03 DIAGNOSIS — E1122 Type 2 diabetes mellitus with diabetic chronic kidney disease: Secondary | ICD-10-CM

## 2016-11-03 LAB — GLUCOSE, CAPILLARY
GLUCOSE-CAPILLARY: 150 mg/dL — AB (ref 65–99)
GLUCOSE-CAPILLARY: 381 mg/dL — AB (ref 65–99)
Glucose-Capillary: 157 mg/dL — ABNORMAL HIGH (ref 65–99)
Glucose-Capillary: 139 mg/dL — ABNORMAL HIGH (ref 65–99)
Glucose-Capillary: 213 mg/dL — ABNORMAL HIGH (ref 65–99)
Glucose-Capillary: 132 mg/dL — ABNORMAL HIGH (ref 65–99)

## 2016-11-03 LAB — BASIC METABOLIC PANEL
ANION GAP: 8 (ref 5–15)
BUN: 17 mg/dL (ref 6–20)
CHLORIDE: 128 mmol/L — AB (ref 101–111)
CO2: 18 mmol/L — AB (ref 22–32)
Calcium: 8.3 mg/dL — ABNORMAL LOW (ref 8.9–10.3)
Creatinine, Ser: 1.68 mg/dL — ABNORMAL HIGH (ref 0.44–1.00)
GFR calc non Af Amer: 32 mL/min — ABNORMAL LOW (ref >60)
GFR, EST AFRICAN AMERICAN: 37 mL/min — AB (ref >60)
Glucose, Bld: 165 mg/dL — ABNORMAL HIGH (ref 65–99)
Potassium: 3.3 mmol/L — ABNORMAL LOW (ref 3.5–5.1)
Sodium: 154 mmol/L — ABNORMAL HIGH (ref 135–145)

## 2016-11-03 LAB — CBC
HEMATOCRIT: 44.6 % (ref 36.0–46.0)
HEMOGLOBIN: 14.3 g/dL (ref 12.0–15.0)
MCH: 29.9 pg (ref 26.0–34.0)
MCHC: 32.1 g/dL (ref 30.0–36.0)
MCV: 93.3 fL (ref 78.0–100.0)
Platelets: 175 10*3/uL (ref 150–400)
RBC: 4.78 MIL/uL (ref 3.87–5.11)
RDW: 14.8 % (ref 11.5–15.5)
WBC: 13.5 10*3/uL — AB (ref 4.0–10.5)

## 2016-11-03 LAB — CULTURE, BLOOD (ROUTINE X 2): SPECIAL REQUESTS: ADEQUATE

## 2016-11-03 LAB — PHOSPHORUS: Phosphorus: 2.1 mg/dL — ABNORMAL LOW (ref 2.5–4.6)

## 2016-11-03 LAB — HERPES SIMPLEX VIRUS(HSV) DNA BY PCR
HSV 1 DNA: NEGATIVE
HSV 2 DNA: NEGATIVE

## 2016-11-03 LAB — MAGNESIUM: Magnesium: 2 mg/dL (ref 1.7–2.4)

## 2016-11-03 LAB — PATHOLOGIST SMEAR REVIEW

## 2016-11-03 MED ORDER — LEVETIRACETAM 500 MG PO TABS
1000.0000 mg | ORAL_TABLET | Freq: Two times a day (BID) | ORAL | Status: DC
  Administered 2016-11-03: 1000 mg via ORAL
  Filled 2016-11-03 (×2): qty 2

## 2016-11-03 MED ORDER — METOPROLOL TARTRATE 25 MG PO TABS
25.0000 mg | ORAL_TABLET | Freq: Two times a day (BID) | ORAL | Status: DC
  Administered 2016-11-03: 25 mg via ORAL
  Filled 2016-11-03 (×2): qty 1

## 2016-11-03 MED ORDER — STROKE: EARLY STAGES OF RECOVERY BOOK
Freq: Once | Status: AC
  Administered 2016-11-03: 1
  Filled 2016-11-03: qty 1

## 2016-11-03 MED ORDER — SODIUM CHLORIDE 0.9 % IV SOLN
30.0000 meq | Freq: Once | INTRAVENOUS | Status: AC
  Administered 2016-11-03: 30 meq via INTRAVENOUS
  Filled 2016-11-03: qty 15

## 2016-11-03 MED ORDER — NITROGLYCERIN 2 % TD OINT
1.0000 [in_us] | TOPICAL_OINTMENT | Freq: Four times a day (QID) | TRANSDERMAL | Status: DC
  Administered 2016-11-03 – 2016-11-06 (×12): 1 [in_us] via TOPICAL
  Filled 2016-11-03: qty 30

## 2016-11-03 MED ORDER — ACETAMINOPHEN 325 MG PO TABS
650.0000 mg | ORAL_TABLET | Freq: Four times a day (QID) | ORAL | Status: DC | PRN
  Administered 2016-11-03 – 2016-11-05 (×4): 650 mg via ORAL
  Filled 2016-11-03 (×3): qty 2

## 2016-11-03 MED ORDER — VANCOMYCIN HCL 10 G IV SOLR
1250.0000 mg | INTRAVENOUS | Status: DC
  Administered 2016-11-03 – 2016-11-05 (×3): 1250 mg via INTRAVENOUS
  Filled 2016-11-03 (×4): qty 1250

## 2016-11-03 MED ORDER — INSULIN ASPART 100 UNIT/ML ~~LOC~~ SOLN
0.0000 [IU] | Freq: Every day | SUBCUTANEOUS | Status: DC
  Administered 2016-11-05: 2 [IU] via SUBCUTANEOUS

## 2016-11-03 MED ORDER — SODIUM CHLORIDE 0.9 % IV SOLN
2.0000 g | Freq: Four times a day (QID) | INTRAVENOUS | Status: DC
  Administered 2016-11-03 – 2016-11-04 (×5): 2 g via INTRAVENOUS
  Filled 2016-11-03 (×8): qty 2000

## 2016-11-03 MED ORDER — LABETALOL HCL 5 MG/ML IV SOLN: 10.0000 mg | Freq: Once | INTRAVENOUS | Status: DC

## 2016-11-03 MED ORDER — PANTOPRAZOLE SODIUM 40 MG PO TBEC
40.0000 mg | DELAYED_RELEASE_TABLET | Freq: Every day | ORAL | Status: DC
  Administered 2016-11-03 – 2016-11-06 (×3): 40 mg via ORAL
  Filled 2016-11-03 (×3): qty 1

## 2016-11-03 MED ORDER — INSULIN ASPART 100 UNIT/ML ~~LOC~~ SOLN
0.0000 [IU] | Freq: Three times a day (TID) | SUBCUTANEOUS | Status: DC
  Administered 2016-11-03: 20 [IU] via SUBCUTANEOUS
  Administered 2016-11-03: 7 [IU] via SUBCUTANEOUS
  Administered 2016-11-04: 3 [IU] via SUBCUTANEOUS
  Administered 2016-11-04 – 2016-11-05 (×2): 7 [IU] via SUBCUTANEOUS
  Administered 2016-11-05: 11 [IU] via SUBCUTANEOUS
  Administered 2016-11-06: 7 [IU] via SUBCUTANEOUS
  Administered 2016-11-06: 3 [IU] via SUBCUTANEOUS

## 2016-11-03 MED ORDER — AMLODIPINE BESYLATE 10 MG PO TABS
10.0000 mg | ORAL_TABLET | Freq: Every day | ORAL | Status: DC
Start: 2016-11-03 — End: 2016-11-06
  Administered 2016-11-03 – 2016-11-06 (×4): 10 mg via ORAL
  Filled 2016-11-03 (×4): qty 1

## 2016-11-03 MED ORDER — ADULT MULTIVITAMIN W/MINERALS CH
1.0000 | ORAL_TABLET | Freq: Every day | ORAL | Status: DC
  Administered 2016-11-03 – 2016-11-06 (×3): 1 via ORAL
  Filled 2016-11-03 (×3): qty 1

## 2016-11-03 MED ORDER — FOLIC ACID 1 MG PO TABS
1.0000 mg | ORAL_TABLET | Freq: Every day | ORAL | Status: DC
  Administered 2016-11-03 – 2016-11-06 (×3): 1 mg via ORAL
  Filled 2016-11-03 (×3): qty 1

## 2016-11-03 NOTE — Progress Notes (Signed)
Inpatient Diabetes Program Recommendations  AACE/ADA: New Consensus Statement on Inpatient Glycemic Control (2015)  Target Ranges:  Prepandial:   less than 140 mg/dL      Peak postprandial:   less than 180 mg/dL (1-2 hours)      Critically ill patients:  140 - 180 mg/dL   Results for MARGRIT, MINNER (MRN [id number]) as of 11/03/2016 13:54  Ref. Range 11/02/2016 23:17 11/03/2016 03:08 11/03/2016 08:23 11/03/2016 12:17  Glucose-Capillary Latest Ref Range: 65 - 99 mg/dL 233 (H) 157 (H) 139 (H) 381 (H)    Home DM Meds: Janumet 50/500 BID       70/30 Insulin- 53 units AM/ 33 units PM (not taking per Home Med Rec)  Current Insulin Orders: Lantus 30 units BID      Novolog Resistant Correction Scale/ SSI (0-20 units) TID AC + HS      MD- Please consider starting Novolog Meal Coverage:  Novolog 6 units TID with meals (hold if pt eats <50% of meal)      --Will follow patient during hospitalization--  Wyn Quaker RN, MSN, CDE Diabetes Coordinator Inpatient Glycemic Control Team Team Pager: [phone] (8a-5p)

## 2016-11-03 NOTE — Progress Notes (Addendum)
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 When her husband's home health nurse came to the house the patient was found altered w/ on the couch w/ evidence of multiple episodes of incontinence. Upon arrival to the ED patient was non-verbal and unable to follow commands. WBC 14.2, Lactic Acid 4.65, Glucose 1200, and pH 7.2. PCCM was asked to consult.   Significant Events: 4/24 Admit w/ DKA - CT Head 4/24 >Brain atrophy and chronic white matter microvascular changes - resolution of remote left pontine hemorrhage  4/25 EEG - no evidence of active seizure  4/26 [facility] assumed care   Subjective: Resting comfortably in bed.  No new complaints.  Denis HA, n/v, sob, abdom pain, or cp.    Assessment & Plan:  Meningitis  Neurology and ID following - defer abx choice to ID  Altered mental status  likely due to combination of meningitis + HONK - much improved today    HONK - severely uncontrolled DM CBG much improved - follow - avoid extremes   HTN - sinus tachycardia  Poorly controlled - adjust tx and follow - likely an element of BB withdrawal  Acute kidney injury on CKD  Creatinine appears to have returned to her baseline   Recent Labs Lab 11/01/16 0650 11/01/16 1329 11/02/16 0320 11/02/16 1906 11/03/16 0342  CREATININE 2.24* 2.14*  2.17* 2.04* 1.97* 1.68*    Hypokalemia Replace and follow  Cocaine abuse   Left parotid space 2.1 cm soft tissue nodule Worrisome for neoplasm per Radiologist - will need ENT f/u   DVT prophylaxis: SQ heparin  Code Status: FULL CODE Family Communication: no family present at time of exam  Disposition Plan:  SDU  Consultants:  Neurology ID PCCM  Antimicrobials:  Vancomycin 4/24 >  Zosyn 4/24 > 4/26 Ceftriaxone 4/26 >  Objective: Blood pressure (!) 157/96, pulse (!) 101, temperature 98.1 F (36.7 C), temperature source Oral, resp. rate 19, height 5\' 8"  (1.727 m), weight 102.1 kg (225 lb), SpO2 99 %.  Intake/Output Summary (Last 24 hours) at 11/03/16 1033 Last data filed at 11/03/16 0800  Gross per 24 hour  Intake          2692.05 ml  Output                0 ml  Net          2692.05 ml   Filed Weights   10/31/16 1702  Weight: 102.1 kg (225 lb)    Examination: General: No acute respiratory distress Lungs: Clear to auscultation bilaterally without wheezes or crackles Cardiovascular: Regular rate and rhythm without murmur gallop or rub normal S1 and S2 Abdomen: Nontender, nondistended, soft, bowel sounds positive, no rebound, no ascites, no appreciable mass Extremities: No significant cyanosis, clubbing, or edema bilateral lower extremities  CBC:  Recent Labs Lab 10/31/16 1513 11/01/16 0215 11/02/16 0320 11/03/16 0342  WBC 14.2* 19.3* 19.6* 13.5*  NEUTROABS 11.8*  --   --   --   HGB 16.0* 15.7* 14.4 14.3  HCT 52.2* 46.2* 43.5 44.6  MCV 100.6* 91.1 91.8 93.3  PLT 270 258 194 335   Basic Metabolic Panel:  Recent Labs Lab 11/01/16 0215 11/01/16 0650 11/01/16 1329 11/02/16 0320 11/02/16 1906 11/03/16 0342  NA 161* 158* 156*  157* 157* 158* 154*  K 2.8* 3.6 3.8  3.9 3.4* 3.2* 3.3*  CL 128* 125* 129*  129* >130* 128* 128*  CO2 23 20* 18*  17* 19* 20* 18*  GLUCOSE 68 212* 313*  325* 245* 169* 165*  BUN 35* 29* 25*  25* 22* 21* 17  CREATININE 2.21* 2.24* 2.14*  2.17* 2.04* 1.97* 1.68*  CALCIUM 9.1 8.6* 8.4*  8.5* 8.6* 9.0 8.3*  MG 2.3  --   --  2.1  --  2.0  PHOS <1.0*  --  1.2* 1.4*  --  2.1*   GFR: Estimated Creatinine Clearance: 45.1 mL/min (A) (by C-G formula based on SCr of 1.68 mg/dL (H)).  Liver Function Tests:  Recent Labs Lab 10/31/16 1513   AST 20  ALT 16  ALKPHOS 69  BILITOT 1.4*  PROT 9.0*  ALBUMIN 3.4*    Coagulation Profile:  Recent Labs Lab 10/31/16 1513  INR 1.09    HbA1C: Hgb A1c MFr Bld  Date/Time Value Ref Range Status  06/06/2016 03:08 PM 11.4 (H) 4.8 - 5.6 % Final    Comment:    (NOTE)         Pre-diabetes: 5.7 - 6.4         Diabetes: >6.4         Glycemic control for adults with diabetes: <7.0   05/15/2011 05:52 PM 12.6 (H) <5.7 % Final    Comment:    (NOTE)                                                                       According to the ADA Clinical Practice Recommendations for 2011, when HbA1c is used as a screening test:  >=6.5%   Diagnostic of Diabetes Mellitus           (if abnormal result is confirmed) 5.7-6.4%   Increased risk of developing Diabetes Mellitus References:Diagnosis and Classification of Diabetes Mellitus,Diabetes ZOXW,9604,54(UJWJX 1):S62-S69 and Standards of Medical Care in         Diabetes - 2011,Diabetes Care,2011,34 (Suppl 1):S11-S61.    CBG:  Recent Labs Lab 11/02/16 1549 11/02/16 2017 11/02/16 2317 11/03/16 0308 11/03/16 0823  GLUCAP 172* 177* 233* 157* 139*    Recent Results (from the past 240 hour(s))  Culture, blood (Routine x 2)     Status: Abnormal   Collection Time: 10/31/16  3:13 PM  Result Value Ref Range Status   Specimen Description BLOOD LEFT HAND  Final   Special Requests   Final    BOTTLES DRAWN AEROBIC AND ANAEROBIC Blood Culture adequate volume   Culture  Setup Time   Final    GRAM POSITIVE COCCI IN CLUSTERS IN BOTH AEROBIC AND ANAEROBIC BOTTLES CRITICAL RESULT CALLED TO, READ BACK BY AND VERIFIED WITH: PHARMD M [place] [phone] MLM    Culture (A)  Final    STAPHYLOCOCCUS SPECIES (COAGULASE NEGATIVE) THE SIGNIFICANCE OF ISOLATING THIS ORGANISM FROM A SINGLE SET OF BLOOD CULTURES WHEN MULTIPLE SETS ARE DRAWN IS UNCERTAIN. PLEASE NOTIFY THE MICROBIOLOGY DEPARTMENT WITHIN ONE WEEK IF SPECIATION AND SENSITIVITIES ARE REQUIRED.     Report Status  11/03/2016 FINAL  Final  Blood Culture ID Panel (Reflexed)     Status: Abnormal   Collection Time: 10/31/16  3:13 PM  Result Value Ref Range Status   Enterococcus species NOT DETECTED NOT DETECTED Final   Listeria monocytogenes NOT DETECTED NOT DETECTED Final   Staphylococcus species DETECTED (A) NOT DETECTED Final    Comment: Methicillin (oxacillin) resistant coagulase negative staphylococcus. Possible blood culture contaminant (unless isolated from more than one blood culture draw or clinical case suggests pathogenicity). No antibiotic treatment is indicated for blood  culture contaminants. CRITICAL RESULT CALLED TO, READ BACK BY AND VERIFIED WITH: PHARMD M [place] [phone] MLM    Staphylococcus aureus NOT DETECTED NOT DETECTED Final   Methicillin resistance DETECTED (A) NOT DETECTED Final    Comment: CRITICAL RESULT CALLED TO, READ BACK BY AND VERIFIED WITH: PHARMD M [place] [phone] MLM    Streptococcus species NOT DETECTED NOT DETECTED Final   Streptococcus agalactiae NOT DETECTED NOT DETECTED Final   Streptococcus pneumoniae NOT DETECTED NOT DETECTED Final   Streptococcus pyogenes NOT DETECTED NOT DETECTED Final   Acinetobacter baumannii NOT DETECTED NOT DETECTED Final   Enterobacteriaceae species NOT DETECTED NOT DETECTED Final   Enterobacter cloacae complex NOT DETECTED NOT DETECTED Final   Escherichia coli NOT DETECTED NOT DETECTED Final   Klebsiella oxytoca NOT DETECTED NOT DETECTED Final   Klebsiella pneumoniae NOT DETECTED NOT DETECTED Final   Proteus species NOT DETECTED NOT DETECTED Final   Serratia marcescens NOT DETECTED NOT DETECTED Final   Haemophilus influenzae NOT DETECTED NOT DETECTED Final   Neisseria meningitidis NOT DETECTED NOT DETECTED Final   Pseudomonas aeruginosa NOT DETECTED NOT DETECTED Final   Candida albicans NOT DETECTED NOT DETECTED Final   Candida glabrata NOT DETECTED NOT DETECTED Final   Candida krusei NOT DETECTED NOT  DETECTED Final   Candida parapsilosis NOT DETECTED NOT DETECTED Final   Candida tropicalis NOT DETECTED NOT DETECTED Final  Culture, blood (Routine x 2)     Status: None (Preliminary result)   Collection Time: 10/31/16  3:27 PM  Result Value Ref Range Status   Specimen Description BLOOD LEFT ANTECUBITAL  Final   Special Requests IN PEDIATRIC BOTTLE Blood Culture adequate volume  Final   Culture NO GROWTH 2 DAYS  Final   Report Status PENDING  Incomplete  Culture, Urine     Status: None   Collection Time: 10/31/16  7:43 PM  Result Value Ref Range Status   Specimen Description URINE, CATHETERIZED  Final   Special Requests NONE  Final   Culture NO GROWTH  Final   Report Status 11/02/2016 FINAL  Final  MRSA PCR Screening     Status: None   Collection Time: 10/31/16  9:24 PM  Result Value Ref Range Status   MRSA by PCR NEGATIVE NEGATIVE Final    Comment:        The GeneXpert MRSA Assay (FDA approved for NASAL specimens only), is one component of a comprehensive MRSA colonization surveillance program. It is not intended to diagnose MRSA infection nor to guide or monitor treatment for MRSA infections.   Respiratory Panel by PCR     Status: None   Collection Time: 11/01/16 10:03 AM  Result Value Ref Range Status   Adenovirus NOT DETECTED NOT DETECTED Final   Coronavirus 229E NOT DETECTED NOT DETECTED Final   Coronavirus HKU1 NOT DETECTED NOT DETECTED Final   Coronavirus NL63 NOT DETECTED NOT DETECTED Final   Coronavirus OC43 NOT DETECTED NOT DETECTED  Final   Metapneumovirus NOT DETECTED NOT DETECTED Final   Rhinovirus / Enterovirus NOT DETECTED NOT DETECTED Final   Influenza A NOT DETECTED NOT DETECTED Final   Influenza B NOT DETECTED NOT DETECTED Final   Parainfluenza Virus 1 NOT DETECTED NOT DETECTED Final   Parainfluenza Virus 2 NOT DETECTED NOT DETECTED Final   Parainfluenza Virus 3 NOT DETECTED NOT DETECTED Final   Parainfluenza Virus 4 NOT DETECTED NOT DETECTED Final    Respiratory Syncytial Virus NOT DETECTED NOT DETECTED Final   Bordetella pertussis NOT DETECTED NOT DETECTED Final   Chlamydophila pneumoniae NOT DETECTED NOT DETECTED Final   Mycoplasma pneumoniae NOT DETECTED NOT DETECTED Final     Scheduled Meds: . aspirin  81 mg Oral Daily  . atorvastatin  40 mg Oral q1800  . chlorhexidine  15 mL Mouth Rinse BID  . heparin  5,000 Units Subcutaneous Q8H  . insulin aspart  0-20 Units Subcutaneous Q4H  . insulin glargine  30 Units Subcutaneous BID  . mouth rinse  15 mL Mouth Rinse q12n4p  . nitroGLYCERIN  1 inch Topical Q6H  . pantoprazole (PROTONIX) IV  40 mg Intravenous Q24H   Continuous Infusions: . sodium chloride 125 mL/hr at 11/03/16 0256  . acyclovir 790 mg (11/03/16 0950)  . ampicillin (OMNIPEN) IV Stopped (11/03/16 0904)  . cefTRIAXone (ROCEPHIN)  IV Stopped (11/03/16 1020)  . dextrose Stopped (11/02/16 1856)  . levETIRAcetam 500 mg (11/03/16 0950)  . vancomycin       LOS: 3 days   Cherene Altes, MD [facility] Office  [phone] Pager - Text Page per Amion as per below:  On-Call/Text Page:      [email or web address]      password [email or web address]  If 7PM-7AM, please contact night-coverage [email or web address] Password [facility] 11/03/2016, 10:33 AM

## 2016-11-03 NOTE — Consult Note (Signed)
[facility]  Date of Admission:  10/31/2016  Date of Consult:  11/03/2016  Reason for Consult: meningtis Referring Physician: Leonel Ramsay  Impression/Recommendation Meningitis Afebrile since 4-25 3am Would continue her current anbx and antivirals.  Not add steroids given duration of her illness and she has received anbx for > 24h.  Await HIV DNA Await CSF Cx.   HONK Gap closed Off IV insulin.   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 female.  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  In ED was tachycardic (120s), hypertensive, febrile (101.8), Glc 1200, lactic acid 4.65, Cr 3.77. Na normal. Found to be cocaine+ as well.  She was started on vanco/zosyn/keppra, insulin drip.  Her Cr improved with hydration and she was transferred out of ICU on 4-25.  She continued to have fever in hospital and on 4-26 underwent LP: WBC 22/24 (90% N) RBC 4/3 Glc 151 Prot 65 Cx pending HSV PCR pending  She was started on vanco/ceftriaxone/acyclovir on 4-26.  She had 1/2 BCx from adm MRSE.   Past Medical History:  Diagnosis Date  . Diabetes mellitus    Hb A1C = 12.6 on 05/15/11, managed on Novolog 70/30, 35 U qam, 25 U qpm  . Hypertension    poorly controlled  . Shortness of breath     Past Surgical History:  Procedure Laterality Date  . ANKLE FRACTURE SURGERY  2007     No Known Allergies  Medications:  Scheduled: . amLODipine  10 mg Oral Daily  . aspirin  81 mg Oral Daily  . atorvastatin  40 mg Oral q1800  . chlorhexidine  15 mL Mouth Rinse BID  . folic acid  1 mg Oral Daily  . heparin  5,000 Units Subcutaneous Q8H  .  insulin aspart  0-20 Units Subcutaneous TID WC  . insulin aspart  0-5 Units Subcutaneous QHS  . insulin glargine  30 Units Subcutaneous BID  . levETIRAcetam  1,000 mg Oral BID  . mouth rinse  15 mL Mouth Rinse q12n4p  . metoprolol tartrate  25 mg Oral BID  . multivitamin with minerals  1 tablet Oral Daily  . nitroGLYCERIN  1 inch Topical Q6H  . pantoprazole  40 mg Oral Q1200    Abtx:  Anti-infectives    Start     Dose/Rate Route Frequency Ordered Stop   11/03/16 1900  vancomycin (VANCOCIN) 1,250 mg in sodium chloride 0.9 % 250 mL IVPB     1,250 mg 166.7 mL/hr over 90 Minutes Intravenous Every 24 hours 11/03/16 0713     11/03/16 0800  ampicillin (OMNIPEN) 2 g in sodium chloride 0.9 % 50 mL IVPB    Comments:  Ampicillin 2 g IV q6h for CrCl < 50 mL/min   2 g 150 mL/hr over 20 Minutes Intravenous Every 6 hours 11/03/16 0712     11/02/16 2200  cefTRIAXone (ROCEPHIN) 2 g in dextrose 5 % 50 mL IVPB     2 g 100 mL/hr over 30 Minutes Intravenous Every 12 hours 11/02/16 1820     11/02/16 2000  acyclovir (ZOVIRAX) 790 mg in dextrose 5 % 150 mL IVPB  10 mg/kg  79.2 kg (Adjusted) 165.8 mL/hr over 60 Minutes Intravenous Every 12 hours 11/02/16 1854     11/02/16 1930  acyclovir (ZOVIRAX) 1,020 mg in dextrose 5 % 250 mL IVPB  Status:  Discontinued    Comments:  Pharmacy consult... 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   10 mg/kg  102.1 kg 270.4 mL/hr over 60 Minutes Intravenous Every 12 hours 11/02/16 1821 11/02/16 1854   11/02/16 1800  vancomycin (VANCOCIN) IVPB 1000 mg/200 mL premix  Status:  Discontinued     1,000 mg 200 mL/hr over 60 Minutes Intravenous Every 48 hours 10/31/16 1845 11/01/16 1429   11/01/16 1900  vancomycin (VANCOCIN) IVPB 1000 mg/200 mL premix  Status:  Discontinued     1,000 mg 200 mL/hr over 60 Minutes Intravenous Every 24 hours 11/01/16 1429 11/03/16 0713   11/01/16 1500  cefTRIAXone (ROCEPHIN) 1 g in dextrose 5 % 50 mL IVPB  Status:  Discontinued     1 g 100 mL/hr over 30  Minutes Intravenous Every 24 hours 10/31/16 1545 10/31/16 1830   11/01/16 0200  piperacillin-tazobactam (ZOSYN) IVPB 3.375 g  Status:  Discontinued     3.375 g 12.5 mL/hr over 240 Minutes Intravenous Every 8 hours 10/31/16 1845 11/02/16 1836   10/31/16 1830  vancomycin (VANCOCIN) 2,000 mg in sodium chloride 0.9 % 500 mL IVPB     2,000 mg 250 mL/hr over 120 Minutes Intravenous  Once 10/31/16 1824 10/31/16 2052   10/31/16 1830  piperacillin-tazobactam (ZOSYN) IVPB 3.375 g     3.375 g 100 mL/hr over 30 Minutes Intravenous  Once 10/31/16 1824 10/31/16 1939   10/31/16 1545  cefTRIAXone (ROCEPHIN) 2 g in dextrose 5 % 50 mL IVPB     2 g 100 mL/hr over 30 Minutes Intravenous  Once 10/31/16 1537 10/31/16 1618      Total days of antibiotics: 1 vanco/ceftriaxone/amp/acyclovir          Social History:  reports that she has been smoking.  She has a 12.50 pack-year smoking history. She has never used smokeless tobacco. She reports that she drinks about 7.2 oz of alcohol per week . She reports that she uses drugs, including Cocaine.  No family history on file.  General ROS: denies sick contacts, no headache prior to adm, mild headache now, no photophobia, denies neck pain, no sick contacts, mild diarrhea, no dysuria, +neuropathy, states she may have run out of a little of her insulin.  Please see HPI. 12 point ROS o/w (-)  Blood pressure (!) 155/89, pulse (!) 102, temperature 98.1 F (36.7 C), temperature source Oral, resp. rate (!) 21, height '5\' 8"'$  (1.727 m), weight 102.1 kg (225 lb), SpO2 98 %. General appearance: alert, cooperative, no distress and slowed mentation Eyes: negative findings: conjunctivae and sclerae normal, pupils equal, round, reactive to light and accomodation and no photphobia Throat: normal findings: oropharynx pink & moist without lesions or evidence of thrush Neck: no adenopathy, supple, symmetrical, trachea midline and no rigidity or resistance to movement.  Lungs: clear to  auscultation bilaterally Heart: regular rate and rhythm Abdomen: normal findings: bowel sounds normal and soft, non-tender Extremities: edema none and no diabetic foot ulcers, +oncyhomycosis Neurologic: Mental status: alertness: slowed thought, orientation: [facility] 2018. believes she is here for her BP.  Sensory: grossly nl light touch BLE.    Results for orders placed or performed during the hospital encounter of 10/31/16 (from the past 48 hour(s))  Glucose, capillary     Status: Abnormal  Collection Time: 11/01/16 12:20 PM  Result Value Ref Range   Glucose-Capillary 536 (HH) 65 - 99 mg/dL   Comment 1 Capillary Specimen   Glucose, capillary     Status: Abnormal   Collection Time: 11/01/16 12:21 PM  Result Value Ref Range   Glucose-Capillary 274 (H) 65 - 99 mg/dL   Comment 1 Capillary Specimen   Basic metabolic panel     Status: Abnormal   Collection Time: 11/01/16  1:29 PM  Result Value Ref Range   Sodium 156 (H) 135 - 145 mmol/L   Potassium 3.8 3.5 - 5.1 mmol/L   Chloride 129 (H) 101 - 111 mmol/L   CO2 18 (L) 22 - 32 mmol/L   Glucose, Bld 313 (H) 65 - 99 mg/dL   BUN 25 (H) 6 - 20 mg/dL   Creatinine, Ser 2.14 (H) 0.44 - 1.00 mg/dL   Calcium 8.4 (L) 8.9 - 10.3 mg/dL   GFR calc non Af Amer 24 (L) >60 mL/min   GFR calc Af Amer 28 (L) >60 mL/min    Comment: (NOTE) The eGFR has been calculated using the CKD EPI equation. This calculation has not been validated in all clinical situations. eGFR's persistently <60 mL/min signify possible Chronic Kidney Disease.    Anion gap 9 5 - 15  Basic metabolic panel     Status: Abnormal   Collection Time: 11/01/16  1:29 PM  Result Value Ref Range   Sodium 157 (H) 135 - 145 mmol/L   Potassium 3.9 3.5 - 5.1 mmol/L   Chloride 129 (H) 101 - 111 mmol/L   CO2 17 (L) 22 - 32 mmol/L   Glucose, Bld 325 (H) 65 - 99 mg/dL   BUN 25 (H) 6 - 20 mg/dL   Creatinine, Ser 2.17 (H) 0.44 - 1.00 mg/dL   Calcium 8.5 (L) 8.9 - 10.3 mg/dL   GFR  calc non Af Amer 24 (L) >60 mL/min   GFR calc Af Amer 27 (L) >60 mL/min    Comment: (NOTE) The eGFR has been calculated using the CKD EPI equation. This calculation has not been validated in all clinical situations. eGFR's persistently <60 mL/min signify possible Chronic Kidney Disease.    Anion gap 11 5 - 15  Phosphorus     Status: Abnormal   Collection Time: 11/01/16  1:29 PM  Result Value Ref Range   Phosphorus 1.2 (L) 2.5 - 4.6 mg/dL  Glucose, capillary     Status: Abnormal   Collection Time: 11/01/16  4:12 PM  Result Value Ref Range   Glucose-Capillary 290 (H) 65 - 99 mg/dL   Comment 1 Capillary Specimen    Comment 2 Notify RN   Glucose, capillary     Status: Abnormal   Collection Time: 11/01/16  7:38 PM  Result Value Ref Range   Glucose-Capillary 201 (H) 65 - 99 mg/dL   Comment 1 Notify RN    Comment 2 Document in Chart   Glucose, capillary     Status: Abnormal   Collection Time: 11/01/16 11:02 PM  Result Value Ref Range   Glucose-Capillary 178 (H) 65 - 99 mg/dL   Comment 1 Notify RN    Comment 2 Document in Chart   Glucose, capillary     Status: Abnormal   Collection Time: 11/01/16 11:04 PM  Result Value Ref Range   Glucose-Capillary 193 (H) 65 - 99 mg/dL   Comment 1 Notify RN    Comment 2 Document in Chart   Procalcitonin  Status: None   Collection Time: 11/02/16  3:20 AM  Result Value Ref Range   Procalcitonin 0.11 ng/mL    Comment:        Interpretation: PCT (Procalcitonin) <= 0.5 ng/mL: Systemic infection (sepsis) is not likely. Local bacterial infection is possible. (NOTE)         ICU PCT Algorithm               Non ICU PCT Algorithm    ----------------------------     ------------------------------         PCT < 0.25 ng/mL                 PCT < 0.1 ng/mL     Stopping of antibiotics            Stopping of antibiotics       strongly encouraged.               strongly encouraged.    ----------------------------     ------------------------------        PCT level decrease by               PCT < 0.25 ng/mL       >= 80% from peak PCT       OR PCT 0.25 - 0.5 ng/mL          Stopping of antibiotics                                             encouraged.     Stopping of antibiotics           encouraged.    ----------------------------     ------------------------------       PCT level decrease by              PCT >= 0.25 ng/mL       < 80% from peak PCT        AND PCT >= 0.5 ng/mL            Continuin g antibiotics                                              encouraged.       Continuing antibiotics            encouraged.    ----------------------------     ------------------------------     PCT level increase compared          PCT > 0.5 ng/mL         with peak PCT AND          PCT >= 0.5 ng/mL             Escalation of antibiotics                                          strongly encouraged.      Escalation of antibiotics        strongly encouraged.   Phosphorus     Status: Abnormal   Collection Time: 11/02/16  3:20 AM  Result Value Ref Range   Phosphorus 1.4 (L) 2.5 - 4.6 mg/dL  Magnesium     Status: None   Collection Time: 11/02/16  3:20 AM  Result Value Ref Range   Magnesium 2.1 1.7 - 2.4 mg/dL  Basic metabolic panel     Status: Abnormal   Collection Time: 11/02/16  3:20 AM  Result Value Ref Range   Sodium 157 (H) 135 - 145 mmol/L   Potassium 3.4 (L) 3.5 - 5.1 mmol/L   Chloride >130 (HH) 101 - 111 mmol/L    Comment: CRITICAL RESULT CALLED TO, READ BACK BY AND VERIFIED WITH: WOFFORD C,RN 11/02/16 0442 WAYK    CO2 19 (L) 22 - 32 mmol/L   Glucose, Bld 245 (H) 65 - 99 mg/dL   BUN 22 (H) 6 - 20 mg/dL   Creatinine, Ser 2.04 (H) 0.44 - 1.00 mg/dL   Calcium 8.6 (L) 8.9 - 10.3 mg/dL   GFR calc non Af Amer 26 (L) >60 mL/min   GFR calc Af Amer 30 (L) >60 mL/min    Comment: (NOTE) The eGFR has been calculated using the CKD EPI equation. This calculation has not been validated in all clinical situations. eGFR's persistently <60  mL/min signify possible Chronic Kidney Disease.   CBC     Status: Abnormal   Collection Time: 11/02/16  3:20 AM  Result Value Ref Range   WBC 19.6 (H) 4.0 - 10.5 K/uL   RBC 4.74 3.87 - 5.11 MIL/uL   Hemoglobin 14.4 12.0 - 15.0 g/dL   HCT 43.5 36.0 - 46.0 %   MCV 91.8 78.0 - 100.0 fL   MCH 30.4 26.0 - 34.0 pg   MCHC 33.1 30.0 - 36.0 g/dL   RDW 14.1 11.5 - 15.5 %   Platelets 194 150 - 400 K/uL  Glucose, capillary     Status: Abnormal   Collection Time: 11/02/16  3:29 AM  Result Value Ref Range   Glucose-Capillary 221 (H) 65 - 99 mg/dL   Comment 1 Notify RN    Comment 2 Document in Chart   Blood gas, arterial     Status: Abnormal   Collection Time: 11/02/16  6:00 AM  Result Value Ref Range   FIO2 0.21    pH, Arterial 7.526 (H) 7.350 - 7.450   pCO2 arterial 23.5 (L) 32.0 - 48.0 mmHg   pO2, Arterial 64.5 (L) 83.0 - 108.0 mmHg   Bicarbonate 19.4 (L) 20.0 - 28.0 mmol/L   Acid-base deficit 3.1 (H) 0.0 - 2.0 mmol/L   O2 Saturation 93.8 %   Patient temperature 98.6    Collection site RIGHT RADIAL    Drawn by [phone]    Sample type ARTERIAL DRAW    Allens test (pass/fail) PASS PASS  Glucose, capillary     Status: Abnormal   Collection Time: 11/02/16  8:20 AM  Result Value Ref Range   Glucose-Capillary 299 (H) 65 - 99 mg/dL   Comment 1 Capillary Specimen    Comment 2 Notify RN   Glucose, capillary     Status: Abnormal   Collection Time: 11/02/16 12:44 PM  Result Value Ref Range   Glucose-Capillary 266 (H) 65 - 99 mg/dL   Comment 1 Capillary Specimen   Protein and glucose, CSF     Status: Abnormal   Collection Time: 11/02/16  3:14 PM  Result Value Ref Range   Glucose, CSF 151 (H) 40 - 70 mg/dL   Total  Protein, CSF 65 (H) 15 - 45 mg/dL  CSF cell count with differential collection tube #: 1 and 4  Status: Abnormal   Collection Time: 11/02/16  3:19 PM  Result Value Ref Range   Tube # 1 and 4    Color, CSF COLORLESS COLORLESS   Appearance, CSF CLEAR CLEAR   Supernatant NOT  INDICATED    RBC Count, CSF 3 (H) 0 /cu mm   WBC, CSF 22 (HH) 0 - 5 /cu mm    Comment: CRITICAL RESULT CALLED TO, READ BACK BY AND VERIFIED WITH: Luanna Cole RN [phone] GREEN R    Segmented Neutrophils-CSF 88 (H) 0 - 6 %   Lymphs, CSF 8 (L) 40 - 80 %   Monocyte-Macrophage-Spinal Fluid 4 (L) 15 - 45 %  CSF cell count with differential collection tube #: 4     Status: Abnormal   Collection Time: 11/02/16  3:19 PM  Result Value Ref Range   Tube # 4    Color, CSF COLORLESS COLORLESS   Appearance, CSF CLEAR CLEAR   Supernatant NOT INDICATED    RBC Count, CSF 4 (H) 0 /cu mm   WBC, CSF 24 (HH) 0 - 5 /cu mm    Comment: CRITICAL RESULT CALLED TO, READ BACK BY AND VERIFIED WITH: Luanna Cole RN [phone] GREEN R    Segmented Neutrophils-CSF 90 (H) 0 - 6 %   Lymphs, CSF 7 (L) 40 - 80 %   Monocyte-Macrophage-Spinal Fluid 3 (L) 15 - 45 %  Glucose, capillary     Status: Abnormal   Collection Time: 11/02/16  3:49 PM  Result Value Ref Range   Glucose-Capillary 172 (H) 65 - 99 mg/dL   Comment 1 Capillary Specimen   Basic metabolic panel     Status: Abnormal   Collection Time: 11/02/16  7:06 PM  Result Value Ref Range   Sodium 158 (H) 135 - 145 mmol/L   Potassium 3.2 (L) 3.5 - 5.1 mmol/L   Chloride 128 (H) 101 - 111 mmol/L   CO2 20 (L) 22 - 32 mmol/L   Glucose, Bld 169 (H) 65 - 99 mg/dL   BUN 21 (H) 6 - 20 mg/dL   Creatinine, Ser 1.97 (H) 0.44 - 1.00 mg/dL   Calcium 9.0 8.9 - 10.3 mg/dL   GFR calc non Af Amer 27 (L) >60 mL/min   GFR calc Af Amer 31 (L) >60 mL/min    Comment: (NOTE) The eGFR has been calculated using the CKD EPI equation. This calculation has not been validated in all clinical situations. eGFR's persistently <60 mL/min signify possible Chronic Kidney Disease.    Anion gap 10 5 - 15  Glucose, capillary     Status: Abnormal   Collection Time: 11/02/16  8:17 PM  Result Value Ref Range   Glucose-Capillary 177 (H) 65 - 99 mg/dL   Comment 1 Notify RN    Comment 2  Document in Chart   Glucose, capillary     Status: Abnormal   Collection Time: 11/02/16 11:17 PM  Result Value Ref Range   Glucose-Capillary 233 (H) 65 - 99 mg/dL   Comment 1 Notify RN    Comment 2 Document in Chart   Glucose, capillary     Status: Abnormal   Collection Time: 11/03/16  3:08 AM  Result Value Ref Range   Glucose-Capillary 157 (H) 65 - 99 mg/dL   Comment 1 Notify RN    Comment 2 Document in Chart   Phosphorus     Status: Abnormal   Collection Time: 11/03/16  3:42 AM  Result Value Ref Range  Phosphorus 2.1 (L) 2.5 - 4.6 mg/dL  Magnesium     Status: None   Collection Time: 11/03/16  3:42 AM  Result Value Ref Range   Magnesium 2.0 1.7 - 2.4 mg/dL  CBC     Status: Abnormal   Collection Time: 11/03/16  3:42 AM  Result Value Ref Range   WBC 13.5 (H) 4.0 - 10.5 K/uL   RBC 4.78 3.87 - 5.11 MIL/uL   Hemoglobin 14.3 12.0 - 15.0 g/dL   HCT 44.6 36.0 - 46.0 %   MCV 93.3 78.0 - 100.0 fL   MCH 29.9 26.0 - 34.0 pg   MCHC 32.1 30.0 - 36.0 g/dL   RDW 14.8 11.5 - 15.5 %   Platelets 175 150 - 400 K/uL  Basic metabolic panel     Status: Abnormal   Collection Time: 11/03/16  3:42 AM  Result Value Ref Range   Sodium 154 (H) 135 - 145 mmol/L   Potassium 3.3 (L) 3.5 - 5.1 mmol/L   Chloride 128 (H) 101 - 111 mmol/L   CO2 18 (L) 22 - 32 mmol/L   Glucose, Bld 165 (H) 65 - 99 mg/dL   BUN 17 6 - 20 mg/dL   Creatinine, Ser 1.68 (H) 0.44 - 1.00 mg/dL   Calcium 8.3 (L) 8.9 - 10.3 mg/dL   GFR calc non Af Amer 32 (L) >60 mL/min   GFR calc Af Amer 37 (L) >60 mL/min    Comment: (NOTE) The eGFR has been calculated using the CKD EPI equation. This calculation has not been validated in all clinical situations. eGFR's persistently <60 mL/min signify possible Chronic Kidney Disease.    Anion gap 8 5 - 15  Glucose, capillary     Status: Abnormal   Collection Time: 11/03/16  8:23 AM  Result Value Ref Range   Glucose-Capillary 139 (H) 65 - 99 mg/dL   Comment 1 Capillary Specimen     Comment 2 Notify RN    Comment 3 Document in Chart       Component Value Date/Time   SDES URINE, CATHETERIZED 10/31/2016 [place] 10/31/2016 1943   CULT NO GROWTH 10/31/2016 1943   REPTSTATUS 11/02/2016 FINAL 10/31/2016 1943   Mr Brain Wo Contrast  Result Date: 11/02/2016 CLINICAL DATA:  61 year old female found down with confusion. Prior small vessel disease including brainstem hemorrhage in 2017. Questionable acute lacunar infarct of the medial right basal ganglia on limited study yesterday. EXAM: MRI HEAD WITHOUT CONTRAST TECHNIQUE: Multiplanar, multiecho pulse sequences of the brain and surrounding structures were obtained without intravenous contrast. COMPARISON:  Brain MRI 11/01/2016 and earlier. FINDINGS: Brain: Subcentimeter focus of restricted diffusion again suspected at the right deep gray matter nuclei, epicenter at the globus pallidus near the junction with the anterior right thalamus and internal capsule (series 4, image 27). No associated hemorrhage or mass effect. Mild if any associated T2 and FLAIR hyperintensity. No other restricted diffusion. Expected evolution of the pontine hemorrhage since November with hemosiderin and encephalomalacia. Abnormal T2 and FLAIR hyperintensity has developed in the bilateral middle cerebellar peduncles. Underlying chronic right pontine lacune. Chronic heterogeneity in the left thalamus is stable. Cerebral hemisphere gray and white matter signal appears stable and within normal limits. No midline shift, mass effect, evidence of mass lesion, ventriculomegaly, extra-axial collection or acute intracranial hemorrhage. Cervicomedullary junction and pituitary are within normal limits. Vascular: Major intracranial vascular flow voids are stable since 2017. Skull and upper cervical spine: Anterior cervical bulky osteophytosis. Visualized bone marrow  signal is within normal limits. Sinuses/Orbits: Stable and negative. Other: Visible internal  auditory structures appear normal. Mastoids remain clear. Stable posterior inferior left parotid space soft tissue nodule measuring 21 mm since 2017 (series 3, image 24). IMPRESSION: 1. Small acute lacunar infarct along the confluence of the right globus pallidus. No associated hemorrhage or mass effect. 2. Expected evolution of the left brainstem hemorrhage since November. 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 3. 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 4. 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 ENT follow-up again recommended. Electronically Signed   By: Genevie Ann M.D.   On: 11/02/2016 12:47   Mr Brain Wo Contrast  Result Date: 11/01/2016 CLINICAL DATA:  Altered mental status.  Confusion. EXAM: MRI HEAD WITHOUT CONTRAST TECHNIQUE: Multiplanar, multiecho pulse sequences of the brain and surrounding structures were obtained without intravenous contrast. COMPARISON:  Head CT 10/31/2016.  MRI 06/07/2016. FINDINGS: Brain: The study suffers from significant motion degradation. There has been an old hemorrhagic infarction in the left pons. There is an old small vessel infarction in the right pons. There is mild edema or gliosis in the right pons and middle cerebellar peduncle. This probably relates to an old insult but I cannot completely rule out a subacute stroke. There is an acute or subacute infarction in the medial basal ganglia on the right. No other acute insult. Old small vessel infarction left thalamus. Cerebral hemispheres do not show any other abnormal finding. No hydrocephalus or extra-axial collection. Vascular: Major vessels at the base of the brain show flow. Skull and upper cervical spine: Negative Sinuses/Orbits: Clear/normal Other: None IMPRESSION: Markedly motion degraded exam. Suspicion of an acute infarction at the medial right basal ganglia. Old  hemorrhage/hemorrhagic infarction left pons. Old small vessel infarction right pons. Question mild edema in the right pons and middle cerebellar peduncle. There could be a subacute insult in this region. Old left thalamic small vessel infarction. Electronically Signed   By: Nelson Chimes M.D.   On: 11/01/2016 15:09   Recent Results (from the past 240 hour(s))  Culture, blood (Routine x 2)     Status: Abnormal   Collection Time: 10/31/16  3:13 PM  Result Value Ref Range Status   Specimen Description BLOOD LEFT HAND  Final   Special Requests   Final    BOTTLES DRAWN AEROBIC AND ANAEROBIC Blood Culture adequate volume   Culture  Setup Time   Final    GRAM POSITIVE COCCI IN CLUSTERS IN BOTH AEROBIC AND ANAEROBIC BOTTLES CRITICAL RESULT CALLED TO, READ BACK BY AND VERIFIED WITH: PHARMD M [place] [id number] MLM    Culture (A)  Final    STAPHYLOCOCCUS SPECIES (COAGULASE NEGATIVE) THE SIGNIFICANCE OF ISOLATING THIS ORGANISM FROM A SINGLE SET OF BLOOD CULTURES WHEN MULTIPLE SETS ARE DRAWN IS UNCERTAIN. PLEASE NOTIFY THE MICROBIOLOGY DEPARTMENT WITHIN ONE WEEK IF SPECIATION AND SENSITIVITIES ARE REQUIRED.    Report Status 11/03/2016 FINAL  Final  Blood Culture ID Panel (Reflexed)     Status: Abnormal   Collection Time: 10/31/16  3:13 PM  Result Value Ref Range Status   Enterococcus species NOT DETECTED NOT DETECTED Final   Listeria monocytogenes NOT DETECTED NOT DETECTED Final   Staphylococcus species DETECTED (A) NOT DETECTED Final    Comment: Methicillin (oxacillin) resistant coagulase negative staphylococcus. Possible blood culture contaminant (unless isolated from more than one blood culture draw or clinical case suggests pathogenicity).  No antibiotic treatment is indicated for blood  culture contaminants. CRITICAL RESULT CALLED TO, READ BACK BY AND VERIFIED WITH: PHARMD M [place] [id number] MLM    Staphylococcus aureus NOT DETECTED NOT DETECTED Final   Methicillin resistance DETECTED (A) NOT  DETECTED Final    Comment: CRITICAL RESULT CALLED TO, READ BACK BY AND VERIFIED WITH: PHARMD M [place] [id number] MLM    Streptococcus species NOT DETECTED NOT DETECTED Final   Streptococcus agalactiae NOT DETECTED NOT DETECTED Final   Streptococcus pneumoniae NOT DETECTED NOT DETECTED Final   Streptococcus pyogenes NOT DETECTED NOT DETECTED Final   Acinetobacter baumannii NOT DETECTED NOT DETECTED Final   Enterobacteriaceae species NOT DETECTED NOT DETECTED Final   Enterobacter cloacae complex NOT DETECTED NOT DETECTED Final   Escherichia coli NOT DETECTED NOT DETECTED Final   Klebsiella oxytoca NOT DETECTED NOT DETECTED Final   Klebsiella pneumoniae NOT DETECTED NOT DETECTED Final   Proteus species NOT DETECTED NOT DETECTED Final   Serratia marcescens NOT DETECTED NOT DETECTED Final   Haemophilus influenzae NOT DETECTED NOT DETECTED Final   Neisseria meningitidis NOT DETECTED NOT DETECTED Final   Pseudomonas aeruginosa NOT DETECTED NOT DETECTED Final   Candida albicans NOT DETECTED NOT DETECTED Final   Candida glabrata NOT DETECTED NOT DETECTED Final   Candida krusei NOT DETECTED NOT DETECTED Final   Candida parapsilosis NOT DETECTED NOT DETECTED Final   Candida tropicalis NOT DETECTED NOT DETECTED Final  Culture, blood (Routine x 2)     Status: None (Preliminary result)   Collection Time: 10/31/16  3:27 PM  Result Value Ref Range Status   Specimen Description BLOOD LEFT ANTECUBITAL  Final   Special Requests IN PEDIATRIC BOTTLE Blood Culture adequate volume  Final   Culture NO GROWTH 2 DAYS  Final   Report Status PENDING  Incomplete  Culture, Urine     Status: None   Collection Time: 10/31/16  7:43 PM  Result Value Ref Range Status   Specimen Description URINE, CATHETERIZED  Final   Special Requests NONE  Final   Culture NO GROWTH  Final   Report Status 11/02/2016 FINAL  Final  MRSA PCR Screening     Status: None   Collection Time: 10/31/16  9:24 PM  Result Value Ref Range  Status   MRSA by PCR NEGATIVE NEGATIVE Final    Comment:        The GeneXpert MRSA Assay (FDA approved for NASAL specimens only), is one component of a comprehensive MRSA colonization surveillance program. It is not intended to diagnose MRSA infection nor to guide or monitor treatment for MRSA infections.   Respiratory Panel by PCR     Status: None   Collection Time: 11/01/16 10:03 AM  Result Value Ref Range Status   Adenovirus NOT DETECTED NOT DETECTED Final   Coronavirus 229E NOT DETECTED NOT DETECTED Final   Coronavirus HKU1 NOT DETECTED NOT DETECTED Final   Coronavirus NL63 NOT DETECTED NOT DETECTED Final   Coronavirus OC43 NOT DETECTED NOT DETECTED Final   Metapneumovirus NOT DETECTED NOT DETECTED Final   Rhinovirus / Enterovirus NOT DETECTED NOT DETECTED Final   Influenza A NOT DETECTED NOT DETECTED Final   Influenza B NOT DETECTED NOT DETECTED Final   Parainfluenza Virus 1 NOT DETECTED NOT DETECTED Final   Parainfluenza Virus 2 NOT DETECTED NOT DETECTED Final   Parainfluenza Virus 3 NOT DETECTED NOT DETECTED Final   Parainfluenza Virus 4 NOT DETECTED NOT DETECTED Final   Respiratory Syncytial Virus NOT DETECTED  NOT DETECTED Final   Bordetella pertussis NOT DETECTED NOT DETECTED Final   Chlamydophila pneumoniae NOT DETECTED NOT DETECTED Final   Mycoplasma pneumoniae NOT DETECTED NOT DETECTED Final      11/03/2016, 10:53 AM     LOS: 3 days    Records and images were personally reviewed where available.

## 2016-11-03 NOTE — Evaluation (Signed)
Clinical/Bedside Swallow Evaluation Patient Details  Name: Paxton Kanaan MRN: [id number] Date of Birth: 12-Oct-1955  Today's Date: 11/03/2016 Time: SLP Start Time (ACUTE ONLY): 0900 SLP Stop Time (ACUTE ONLY): 0915 SLP Time Calculation (min) (ACUTE ONLY): 15 min  Past Medical History:  Past Medical History:  Diagnosis Date  . Diabetes mellitus    Hb A1C = 12.6 on 05/15/11, managed on Novolog 70/30, 35 U qam, 25 U qpm  . Hypertension    poorly controlled  . Shortness of breath    Past Surgical History:  Past Surgical History:  Procedure Laterality Date  . ANKLE FRACTURE SURGERY  20053   HPI:  61 year old female with PMH of DM, HTN, chronic back pain, tobacco/cocaine abuse, CKD III, and recent ICH (right residual hemiparesis). Presents to ED on 4/24 after being found at home with altered mental status. Per report patient typically takes care of husband and today when home health nurse came to the house, patient was found on the couch with multiple episodes of incontinence. Found ot have DKA, small acute lacunar infarct and right globus pallidus. CXR 4/24 >Slight bibasilar subsegmental atelectasisand mild vascular congestionPt has a history of pontine hemorrhage in 12/17 and had MBS at that time showing delayed swallow with aspiration of thin and trace aspiration of nectar. Pt recommended to consume nectar thick liquids with therapy to train a breath hold with swallowing.    Assessment / Plan / Recommendation Clinical Impression  Pt demonstrates signs of a persistent oropharyngeal dysphagia, though signs of aspiration are lessened in severity since last SLP assessment. Pt was last recommended to consume nectar thick liquids immediately after CVA, but did not follow this recommendation after d/c and did not f/u with SLP. Today pt presents with coughing following large consecutive sips of thin liquids only. When taking small single sips, pt did not exhibit signs of aspiration, but silent  aspiration events are possible. However, given that pts lungs were clear on admission and is not significantly weak or altered at this time with a strong reflexive cough if needed, would not significantly restrict diet. Will initiate a regular diet and thin liquids, though pt should not use straws and should take small single sips, pills in puree. Will f/u for tolerance and to encourage precautions. If pt shows significant coughing or inability to follow strategies, can downgrade to nectar thick liquids.  SLP Visit Diagnosis: Dysphagia, oropharyngeal phase (R13.12)    Aspiration Risk  Mild aspiration risk    Diet Recommendation Regular;Thin liquid   Liquid Administration via: Cup;No straw Medication Administration: Whole meds with puree Supervision: Patient able to self feed Compensations: Slow rate;Small sips/bites Postural Changes: Seated upright at 90 degrees    Other  Recommendations Oral Care Recommendations: Oral care BID   Follow up Recommendations 24 hour supervision/assistance      Frequency and Duration min 2x/week  2 weeks       Prognosis Prognosis for Safe Diet Advancement: Good      Swallow Study   General HPI: 61 year old female with PMH of DM, HTN, chronic back pain, tobacco/cocaine abuse, CKD III, and recent ICH (right residual hemiparesis). Presents to ED on 4/24 after being found at home with altered mental status. Per report patient typically takes care of husband and today when home health nurse came to the house, patient was found on the couch with multiple episodes of incontinence. Found ot have DKA, small acute lacunar infarct and right globus pallidus. CXR 4/24 >Slight bibasilar subsegmental  atelectasisand mild vascular congestionPt has a history of pontine hemorrhage in 12/17 and had MBS at that time showing delayed swallow with aspiration of thin and trace aspiration of nectar. Pt recommended to consume nectar thick liquids with therapy to train a breath hold  with swallowing.  Type of Study: Bedside Swallow Evaluation Previous Swallow Assessment: see HPI Diet Prior to this Study: NPO Temperature Spikes Noted: No Respiratory Status: Room air History of Recent Intubation: No Behavior/Cognition: Alert;Cooperative;Pleasant mood Oral Cavity Assessment: Within Functional Limits Oral Care Completed by SLP: No Oral Cavity - Dentition: Edentulous Vision: Functional for self-feeding Self-Feeding Abilities: Able to feed self Patient Positioning: Upright in bed Baseline Vocal Quality: Normal Volitional Cough: Strong Volitional Swallow: Able to elicit    Oral/Motor/Sensory Function Overall Oral Motor/Sensory Function: Within functional limits   Ice Chips Ice chips: Not tested   Thin Liquid Thin Liquid: Impaired Presentation: Cup;Self Fed;Straw Pharyngeal  Phase Impairments: Cough - Immediate    Nectar Thick Nectar Thick Liquid: Within functional limits   Honey Thick Honey Thick Liquid: Not tested   Puree Puree: Within functional limits   Solid   GO   Solid: Not tested       Herbie [place], MA CCC-SLP [phone]  Lynann Beaver 11/03/2016,9:27 AM

## 2016-11-03 NOTE — Progress Notes (Signed)
Pharmacy Antibiotic Note  [name] is a 61 y.o. female admitted on 10/31/2016 with possible meningitis.  Pharmacy has been consulted for Ampicillin, Acyclovir, Vancomycin, and Ceftriaxone dosing.  ID: WBC down 13.5, PCT negative, LA 4.65 >>6.3, Afebrile LP CSF- 151 GLU, 65 Protein; >20 WBC, predominant seg neutrophils  Renal: SCr down 1.68, UOP good (charting by occurrences not volume per RN); Na down some 154, Phos 2.1, K 3.2  Mg 2. D5@50  mL/hr + 1/2 NS @ 125   Plan: Increase Vancomycin to 1250mg  IV every 24 hours for improved renal function and need to penetrate CSF. Goal trough 15-20 mcg/mL. Plan for vancomycin trough at Css but will continue to adjust if renal function continues to improve.  Add Ampicillin 2g IV every 6 hours.  Continue Ceftriaxone 2g IV every 12 hours. Continue Acyclovir 790mg  (10mg /kg ABW) IV every 12 hours.    Height: 5\' 8"  (172.7 cm) Weight: 225 lb (102.1 kg) IBW/kg (Calculated) : 63.9  Temp (24hrs), Avg:98.1 F (36.7 C), Min:97.6 F (36.4 C), Max:98.9 F (37.2 C)   Recent Labs Lab 10/31/16 1513 10/31/16 1524 10/31/16 1956 10/31/16 2228 11/01/16 0215 11/01/16 0650 11/01/16 1329 11/02/16 0320 11/02/16 1906 11/03/16 0342  WBC 14.2*  --   --   --  19.3*  --   --  19.6*  --  13.5*  CREATININE 3.77*  --  3.12* 2.61* 2.21* 2.24* 2.14*  2.17* 2.04* 1.97* 1.68*  LATICACIDVEN  --  4.65* 5.3* 6.3*  --   --   --   --   --   --     Estimated Creatinine Clearance: 45.1 mL/min (A) (by C-G formula based on SCr of 1.68 mg/dL (H)).    No Known Allergies  Antimicrobials this admission: 4/24 Vanc>> 4/24 Zosyn>>4/26 4/24 Ceftriaxone x1; 4/27 >> 4/26 Acyclovir >> 4/27 Ampicillin >>  Dose adjustments this admission: 4/25 SCr improving >> incr Vanc 1g q48 to q24h.  4/27 SCr further improved >> incr Vanc to 1250 q24h  Microbiology results: 4/24BCx: 1/2 GPC clusters (BCID MRSE) 4/24 UCx: negative 4/24 MRSA pcr negative 4/25 Resp viral  panel: negative 4/26 CSF:   4/26 BCx:  Thank you for allowing pharmacy to be a part of this patient's care.  Sloan Leiter, PharmD, BCPS Clinical Pharmacist Clinical phone 11/03/2016 until 3:30 PM - [phone] After hours, please call #[phone] 11/03/2016 7:25 AM

## 2016-11-03 NOTE — Progress Notes (Addendum)
Subjective: Much improved.   Exam: Vitals:   11/03/16 1150 11/03/16 1214  BP: (!) 165/89   Pulse: (!) 110   Resp:    Temp:  98.3 F (36.8 C)   Gen: In bed, NAD Resp: non-labored breathing, no acute distress Abd: soft, nt  Neuro: MS: awake, alert, oriented to person, place, month. She is able to answer questions appropriately. Remembers procedure from yesterdya.  CN: face symmetric, EOMI Motor: MAEW Sensory:intact to LT   Impression: 61 yo F with HONK with encephaloapthy. Given fevers and leukocytosis of unclear etiology, LP was performed with pleocytosis, concern for encephalitis.   With no clear source of fever and continued AMS< I do think LP is indicated. Will perform today.   She also has an INCIDENTAL right basal ganglia infarct. If this is real, suspect small vessel disease as opposed to embolic. She had a recent echo in December, and I don't think we need to repeat this.  I do not think that this contributed to her AMS.   Recommendations:  1) keppra 500mg  BID 2) continue ASA 3) carotid dopplers 4) Will need PT, OT, ST 5) LDL, A1C 6)  will follow.   Roland Rack, MD [facility] [phone]  If 7pm- 7am, please page neurology on call as listed in [place].

## 2016-11-04 ENCOUNTER — Inpatient Hospital Stay (HOSPITAL_COMMUNITY): Payer: Medicaid Other

## 2016-11-04 LAB — LIPID PANEL
CHOLESTEROL: 134 mg/dL (ref 0–200)
HDL: 26 mg/dL — AB (ref >40)
LDL Cholesterol: 82 mg/dL (ref 0–99)
TRIGLYCERIDES: 131 mg/dL (ref <150)
Total CHOL/HDL Ratio: 5.2 RATIO
VLDL: 26 mg/dL (ref 0–40)

## 2016-11-04 LAB — COMPREHENSIVE METABOLIC PANEL
ALBUMIN: 2.1 g/dL — AB (ref 3.5–5.0)
ALT: 14 U/L (ref 14–54)
AST: 19 U/L (ref 15–41)
Alkaline Phosphatase: 37 U/L — ABNORMAL LOW (ref 38–126)
Anion gap: 8 (ref 5–15)
BILIRUBIN TOTAL: 0.4 mg/dL (ref 0.3–1.2)
BUN: 10 mg/dL (ref 6–20)
CALCIUM: 8.1 mg/dL — AB (ref 8.9–10.3)
CO2: 20 mmol/L — ABNORMAL LOW (ref 22–32)
Chloride: 119 mmol/L — ABNORMAL HIGH (ref 101–111)
Creatinine, Ser: 1.37 mg/dL — ABNORMAL HIGH (ref 0.44–1.00)
GFR calc Af Amer: 48 mL/min — ABNORMAL LOW (ref >60)
GFR calc non Af Amer: 41 mL/min — ABNORMAL LOW (ref >60)
GLUCOSE: 78 mg/dL (ref 65–99)
POTASSIUM: 2.7 mmol/L — AB (ref 3.5–5.1)
Sodium: 147 mmol/L — ABNORMAL HIGH (ref 135–145)
TOTAL PROTEIN: 6 g/dL — AB (ref 6.5–8.1)

## 2016-11-04 LAB — GLUCOSE, CAPILLARY
GLUCOSE-CAPILLARY: 70 mg/dL (ref 65–99)
GLUCOSE-CAPILLARY: 79 mg/dL (ref 65–99)
GLUCOSE-CAPILLARY: 125 mg/dL — AB (ref 65–99)
GLUCOSE-CAPILLARY: 212 mg/dL — AB (ref 65–99)
GLUCOSE-CAPILLARY: 124 mg/dL — AB (ref 65–99)
Glucose-Capillary: 74 mg/dL (ref 65–99)

## 2016-11-04 LAB — URINE DRUGS OF ABUSE SCREEN W ALC, ROUTINE (REF LAB)
AMPHETAMINES, URINE: NEGATIVE ng/mL
BENZODIAZEPINE QUANT UR: NEGATIVE ng/mL
Barbiturate, Ur: NEGATIVE ng/mL
Cannabinoid Quant, Ur: NEGATIVE ng/mL
ETHANOL U, QUAN: NEGATIVE %
METHADONE SCREEN, URINE: NEGATIVE ng/mL
Opiate Quant, Ur: NEGATIVE ng/mL
PROPOXYPHENE, URINE: NEGATIVE ng/mL
Phencyclidine, Ur: NEGATIVE ng/mL

## 2016-11-04 LAB — CBC
HCT: 39.7 % (ref 36.0–46.0)
Hemoglobin: 12.9 g/dL (ref 12.0–15.0)
MCH: 29.9 pg (ref 26.0–34.0)
MCHC: 32.5 g/dL (ref 30.0–36.0)
MCV: 92.1 fL (ref 78.0–100.0)
PLATELETS: 171 10*3/uL (ref 150–400)
RBC: 4.31 MIL/uL (ref 3.87–5.11)
RDW: 14.2 % (ref 11.5–15.5)
WBC: 8.8 10*3/uL (ref 4.0–10.5)

## 2016-11-04 LAB — HEPATITIS PANEL, ACUTE
HCV Ab: >11 s/co ratio — ABNORMAL HIGH (ref 0.0–0.9)
HEP A IGM: NEGATIVE
HEP B C IGM: NEGATIVE
Hepatitis B Surface Ag: NEGATIVE

## 2016-11-04 LAB — MAGNESIUM: MAGNESIUM: 1.6 mg/dL — AB (ref 1.7–2.4)

## 2016-11-04 LAB — COCAINE CONF, UR
Benzoylecgonine GC/MS Conf: 1168 ng/mL
Cocaine Metab Quant, Ur: POSITIVE — AB

## 2016-11-04 MED ORDER — DIPHENHYDRAMINE HCL 50 MG/ML IJ SOLN
12.5000 mg | Freq: Once | INTRAMUSCULAR | Status: AC
  Administered 2016-11-04: 12.5 mg via INTRAVENOUS
  Filled 2016-11-04: qty 1

## 2016-11-04 MED ORDER — METOCLOPRAMIDE HCL 5 MG/ML IJ SOLN
10.0000 mg | Freq: Once | INTRAMUSCULAR | Status: AC
  Administered 2016-11-04: 10 mg via INTRAVENOUS
  Filled 2016-11-04: qty 2

## 2016-11-04 MED ORDER — SODIUM CHLORIDE 0.9 % IV SOLN
1000.0000 mg | Freq: Two times a day (BID) | INTRAVENOUS | Status: DC
  Administered 2016-11-04 – 2016-11-06 (×6): 1000 mg via INTRAVENOUS
  Filled 2016-11-04 (×6): qty 10

## 2016-11-04 MED ORDER — METOPROLOL TARTRATE 5 MG/5ML IV SOLN
5.0000 mg | Freq: Two times a day (BID) | INTRAVENOUS | Status: DC
  Administered 2016-11-04 (×3): 5 mg via INTRAVENOUS
  Filled 2016-11-04 (×4): qty 5

## 2016-11-04 MED ORDER — POTASSIUM CHLORIDE CRYS ER 20 MEQ PO TBCR
40.0000 meq | EXTENDED_RELEASE_TABLET | Freq: Two times a day (BID) | ORAL | Status: DC
  Administered 2016-11-04 (×2): 40 meq via ORAL
  Filled 2016-11-04 (×3): qty 2

## 2016-11-04 MED ORDER — SODIUM CHLORIDE 0.9 % IV SOLN
30.0000 meq | Freq: Once | INTRAVENOUS | Status: AC
  Administered 2016-11-04: 30 meq via INTRAVENOUS
  Filled 2016-11-04: qty 15

## 2016-11-04 MED ORDER — MORPHINE SULFATE (PF) 4 MG/ML IV SOLN
2.0000 mg | Freq: Once | INTRAVENOUS | Status: AC
  Administered 2016-11-04: 2 mg via INTRAVENOUS
  Filled 2016-11-04: qty 1

## 2016-11-04 MED ORDER — MAGNESIUM SULFATE 2 GM/50ML IV SOLN
2.0000 g | Freq: Once | INTRAVENOUS | Status: AC
  Administered 2016-11-04: 2 g via INTRAVENOUS
  Filled 2016-11-04: qty 50

## 2016-11-04 NOTE — Progress Notes (Signed)
Subjective: Continues to be much improved. She does complain of some unilateral retro-orbital headache on the right associated with photophobia.  Exam: Vitals:   11/04/16 0404 11/04/16 0955  BP: (!) 154/96 (!) 182/86  Pulse: 85 82  Resp: 16 16  Temp: 98.6 F (37 C) 98.4 F (36.9 C)   Gen: In bed, NAD Resp: non-labored breathing, no acute distress Abd: soft, nt  Neuro: MS: awake, alert, oriented to person, place, month. She is able to answer questions appropriately. Some difficulty with spelling world CN: face symmetric, EOMI Motor: MAEW Sensory:intact to LT   Impression: 61 yo F with HONK with encephaloapthy. Given fevers and leukocytosis of unclear etiology, LP was performed with pleocytosis, concern for encephalitis or meningitis. I wonder if she has a more generalized viral syndrome responsible for her malaise prior to this with some meningitic involvement, though the neutrophilic predominance is unusual.  She also has an INCIDENTAL right basal ganglia infarct. If this is real, suspect small vessel disease as opposed to embolic. She had a recent echo in December, and I don't think we need to repeat this.  I do not think that this contributed to her AMS.   Her CSF profile is unusual given the low number with neutrophilic predominance. One possibility could be that she had a treated infection with a trending down white count, but 22 still seems like it would be low for bacterial involvement. I would favor continued antibiotics, though could discontinue acyclovir given negative HSV by PCR.  Recommendations:  1) keppra 500mg  BID, I would continue this in the short-term, at least for a  few months though may not need long-term 2) continue ASA 3) if carotid Dopplers with significant stenosis, may need outpatient vascular surgery referral 4) Will need PT, OT, ST 5) recommend increasing statin therapy for LDL >70 6) antibiotics per infectious disease, congestive discontinue  acyclovir 7) neurology to sign off at this time, please call with further questions or concerns.  Roland Rack, MD [facility] [phone]  If 7pm- 7am, please page neurology on call as listed in [place].

## 2016-11-04 NOTE — Progress Notes (Signed)
Rehab Admissions Coordinator Note:  Patient was screened by Cleatrice Burke for appropriateness for an Inpatient Acute Rehab Consult per PT and OT recommendations.  At this time, we are recommending Inpatient Rehab consult. Please place order to assess for possible admission to CIR.  Cleatrice Burke 11/04/2016, 6:28 PM  I can be reached at [phone].

## 2016-11-04 NOTE — Progress Notes (Signed)
[facility] for Infectious Disease    Date of Admission:  10/31/2016   Total days of antibiotics 5        Day 2 amp        Day 4 vanco        Day 5 ceftriaxone   ID: [name] is a 61 y.o. female with  Principal Problem:   Meningitis Active Problems:   Essential hypertension   GERD   Pontine hemorrhage (HCC)   Seizure ([place])   ARF (acute renal failure) (HCC)   Severe sepsis ([employer] [employer])   DKA (diabetic ketoacidoses) (HCC)   AKI (acute kidney injury) ([place])    Subjective: Afebrile, more alert. HSV PCR negative. Leukocytosis improving  Medications:  . amLODipine  10 mg Oral Daily  . aspirin  81 mg Oral Daily  . atorvastatin  40 mg Oral q1800  . folic acid  1 mg Oral Daily  . heparin  5,000 Units Subcutaneous Q8H  . insulin aspart  0-20 Units Subcutaneous TID WC  . insulin aspart  0-5 Units Subcutaneous QHS  . insulin glargine  30 Units Subcutaneous BID  . metoCLOPramide (REGLAN) injection  10 mg Intravenous Once  . metoprolol  5 mg Intravenous Q12H  . multivitamin with minerals  1 tablet Oral Daily  . nitroGLYCERIN  1 inch Topical Q6H  . pantoprazole  40 mg Oral Q1200  . potassium chloride  40 mEq Oral BID     Lab Results  Recent Labs  11/03/16 0342 11/04/16 0610  WBC 13.5* 8.8  HGB 14.3 12.9  HCT 44.6 39.7  NA 154* 147*  K 3.3* 2.7*  CL 128* 119*  CO2 18* 20*  BUN 17 10  CREATININE 1.68* 1.37*   Liver Panel  Recent Labs  11/04/16 0610  PROT 6.0*  ALBUMIN 2.1*  AST 19  ALT 14  ALKPHOS 37*  BILITOT 0.4    Microbiology: 4/26 blood cx ngtd 4/26 csf cx ngtd Studies/Results: Mr Brain Wo Contrast  Result Date: 11/02/2016 CLINICAL DATA:  60 year old female found down with confusion. Prior small vessel disease including brainstem hemorrhage in 2017. Questionable acute lacunar infarct of the medial right basal ganglia on limited study yesterday. EXAM: MRI HEAD WITHOUT CONTRAST TECHNIQUE: Multiplanar, multiecho pulse sequences of the brain and  surrounding structures were obtained without intravenous contrast. COMPARISON:  Brain MRI 11/01/2016 and earlier. FINDINGS: Brain: Subcentimeter focus of restricted diffusion again suspected at the right deep gray matter nuclei, epicenter at the globus pallidus near the junction with the anterior right thalamus and internal capsule (series 4, image 27). No associated hemorrhage or mass effect. Mild if any associated T2 and FLAIR hyperintensity. No other restricted diffusion. Expected evolution of the pontine hemorrhage since November with hemosiderin and encephalomalacia. Abnormal T2 and FLAIR hyperintensity has developed in the bilateral middle cerebellar peduncles. Underlying chronic right pontine lacune. Chronic heterogeneity in the left thalamus is stable. Cerebral hemisphere gray and white matter signal appears stable and within normal limits. No midline shift, mass effect, evidence of mass lesion, ventriculomegaly, extra-axial collection or acute intracranial hemorrhage. Cervicomedullary junction and pituitary are within normal limits. Vascular: Major intracranial vascular flow voids are stable since 2017. Skull and upper cervical spine: Anterior cervical bulky osteophytosis. Visualized bone marrow signal is within normal limits. Sinuses/Orbits: Stable and negative. Other: Visible internal auditory structures appear normal. Mastoids remain clear. Stable posterior inferior left parotid space soft tissue nodule measuring 21 mm since 2017 (series 3, image 24). IMPRESSION: 1. Small acute  lacunar infarct along the confluence of the right globus pallidus. No associated hemorrhage or mass effect. 2. Expected evolution of the left brainstem hemorrhage since November. 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 3. 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 4. 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 ENT follow-up again recommended. Electronically Signed   By: Genevie Ann M.D.   On: 11/02/2016 12:47     Assessment/Plan: Meningitis - CSF profile shows low level pleocytosis but neutrophilic predominance, concerning for meningoencephalitis. Her LP was done roughly 48hr after initiation of abtx which could have affected overall csf profile but mostly her culture.   For now would keep vancomycin plus ceftriaxone. Can d/c ampicillin and acyclovir. Plan to treat for 7 days.  Hep c ab positive = will check viral load to see if candidate for treatment.   Right basal ganglia infarct = continue on aspirin  Kieren Ricci, [facility] for Infectious Diseases Cell: [phone] Pager: [phone]  11/04/2016, 11:00 AM

## 2016-11-04 NOTE — Progress Notes (Signed)
  Speech Language Pathology Treatment: Dysphagia  Patient Details Name: [name] MRN: [id number] DOB: [birth year] Today's Date: 11/04/2016 Time: 7353-2992 SLP Time Calculation (min) (ACUTE ONLY): 14 min  Assessment / Plan / Recommendation Clinical Impression  Pt seen for dysphagia treatment; was initially cleared for regular diet with thin liquids, but was made NPO last night per RN due to severe coughing with PO. Initially pt tolerates single sips of thin liquids, though presents with subtle throat clearing with approx 50% of trials, even single sips, suggestive of reduced airway protection. Requires consistent cues to take single sips, though she was able to verbalize this precaution independently. Following trials of nectar thick liquids (4 oz via single cup sips), pt presented with strong, severe coughing episode suggestive of reduced airway protection. Given prior history of dysphagia and trace aspiration of nectar thick liquids seen on previous MBS, risk for silent aspiration, recommend MBS to aid in determining safest, least restrictive diet and to aid in identifying physiologic impairments for treatment planning. MBS to be performed this morning. Pt may have necessary medications crushed in puree; otherwise remain NPO pending instrumental assessment. Additional recommendations to follow MBS.   HPI HPI: 61 year old female with PMH of DM, HTN, chronic back pain, tobacco/cocaine abuse, CKD III, and recent ICH (right residual hemiparesis). Presents to ED on 4/24 after being found at home with altered mental status. Per report patient typically takes care of husband and today when home health nurse came to the house, patient was found on the couch with multiple episodes of incontinence. Found ot have DKA, small acute lacunar infarct and right globus pallidus. CXR 4/24 >Slight bibasilar subsegmental atelectasisand mild vascular congestionPt has a history of pontine hemorrhage in 12/17 and had  MBS at that time showing delayed swallow with aspiration of thin and trace aspiration of nectar. Pt recommended to consume nectar thick liquids with therapy to train a breath hold with swallowing.       SLP Plan  MBS;New goals to be determined pending instrumental study  Patient needs continued Speech Lanaguage Pathology Services    Recommendations  Medication Administration: Crushed with puree Supervision: Full supervision/cueing for compensatory strategies                Follow up Recommendations: Other (comment) SLP Visit Diagnosis: Dysphagia, oropharyngeal phase (R13.12) Attention and concentration deficit following: Cerebral infarction Frontal lobe and executive function deficit following: Cerebral infarction Plan: MBS;New goals to be determined pending instrumental study       [place], [place] CF-SLP Speech-Language Pathologist [phone]  Aliene Altes 11/04/2016, 10:05 AM

## 2016-11-04 NOTE — Evaluation (Signed)
Physical Therapy Evaluation Patient Details Name: [name] MRN: [id number] DOB: 1956/03/03 Today's Date: 11/04/2016   History of Present Illness  Pt is a 61 y.o. female who presented to the ED on 4/24 after being found at home with AMS. When her husband's home health nurse came to the house the patient was found altered on the couch with evidence of multiple episodes of incontinence. She was non-verbal and unable to follow commands on arrival. CT head 4/24 revealed brain atrophy and chronic while matter microvascular changes. MRI revealed small acute lacunar infarct along the confluence of the R globus pallidus. EEG revealed no active seizure 4/25. LP completed 4/26.  Clinical Impression  Pt admitted with above complications. Pt currently with functional limitations due to the deficits listed below (see PT Problem List). Demonstrates instability with gait and standing requiring min assist for balance and frequent VC for safety and directions with basic tasks. Frequently incontinent of stool and urine (watery and foul-smelling.) Following simple single step commands with verbal and tactile cues at times. Pt will benefit from skilled PT to increase their independence and safety with mobility to allow discharge to the venue listed below.       Follow Up Recommendations CIR (Will update as appropriate, pending cognitive changes)    Equipment Recommendations   (TBD)    Recommendations for Other Services Rehab consult     Precautions / Restrictions Precautions Precautions: Fall Precaution Comments: Bowel/urinary incontinence Restrictions Weight Bearing Restrictions: No      Mobility  Bed Mobility Overal bed mobility: Needs Assistance Bed Mobility: Supine to Sit     Supine to sit: Min guard     General bed mobility comments: Min guard, required cues for safety and technique but no physical assist on this visit.   Transfers Overall transfer level: Needs assistance Equipment  used: Rolling walker (2 wheeled) Transfers: Sit to/from Stand Sit to Stand: Min assist         General transfer comment: Min assist for boost, practiced from the bed, and from commode pt able to perform with min guard assist. VC for technique and hand placement. Able to stand at [facility] and perform pericare after bowel movement. Pt was incontinent during transfer of urine and stool, but was aware that she needed to go urgently. Demonstrates some instability that improved with RW support and cues for sequencing, with min assist for walker negotiation.  Ambulation/Gait Ambulation/Gait assistance: Min assist Ambulation Distance (Feet): 15 Feet Assistive device: Rolling walker (2 wheeled) Gait Pattern/deviations: Step-through pattern;Decreased stride length;Staggering left;Staggering right;Wide base of support Gait velocity: decreased Gait velocity interpretation: Below normal speed for age/gender General Gait Details: Intermittent min assist for balance, distance limited to bedroom due to incontinence of bowel and stool. Assist with walker navigation and proximity for safety.  Stairs            Wheelchair Mobility    Modified Rankin (Stroke Patients Only) Modified Rankin (Stroke Patients Only) Pre-Morbid Rankin Score: No symptoms Modified Rankin: Moderately severe disability     Balance Overall balance assessment: Needs assistance Sitting-balance support: No upper extremity supported;Feet supported Sitting balance-Leahy Scale: Good     Standing balance support: Bilateral upper extremity supported;During functional activity;No upper extremity supported Standing balance-Leahy Scale: Fair Standing balance comment: Able to stand to perform pericare with intermittent min assist for balance.                             Pertinent  Vitals/Pain Pain Assessment: No/denies pain    Home Living Family/patient expects to be discharged to:: Private residence Living  Arrangements: Spouse/significant other Available Help at Discharge: Friend(s);Family;Available PRN/intermittently Type of Home: House Home Access: Level entry     Home Layout: One level Home Equipment: None (all DME for significant other)      Prior Function Level of Independence: Independent         Comments: Takes care of her significant other providing physical assistance for his ADL.     Hand Dominance   Dominant Hand: Right    Extremity/Trunk Assessment   Upper Extremity Assessment Upper Extremity Assessment: Defer to OT evaluation    Lower Extremity Assessment Lower Extremity Assessment: Generalized weakness (no focal weakness noted.)       Communication   Communication: No difficulties  Cognition Arousal/Alertness: Lethargic Behavior During Therapy: Impulsive Overall Cognitive Status: Impaired/Different from baseline Area of Impairment: Attention;Memory;Following commands;Safety/judgement;Awareness;Problem solving                   Current Attention Level: Sustained Memory: Decreased short-term memory Following Commands: Follows one step commands consistently Safety/Judgement: Decreased awareness of safety;Decreased awareness of deficits Awareness: Intellectual Problem Solving: Slow processing;Decreased initiation;Difficulty sequencing;Requires verbal cues;Requires tactile cues General Comments: Responds to simple commands i.e. walk to the sink, wash your hands, etc.      General Comments General comments (skin integrity, edema, etc.): Multiple loose stools and urinary incontinence during evaluation.    Exercises     Assessment/Plan    PT Assessment Patient needs continued PT services  PT Problem List Decreased strength;Decreased activity tolerance;Decreased balance;Decreased mobility;Decreased coordination;Decreased cognition;Decreased knowledge of use of DME;Decreased safety awareness;Decreased knowledge of precautions       PT Treatment  Interventions DME instruction;Gait training;Functional mobility training;Therapeutic activities;Therapeutic exercise;Balance training;Neuromuscular re-education;Cognitive remediation;Patient/family education    PT Goals (Current goals can be found in the Care Plan section)  Acute Rehab PT Goals Patient Stated Goal: To eat PT Goal Formulation: Patient unable to participate in goal setting Time For Goal Achievement: 11/18/16 Potential to Achieve Goals: Good    Frequency Min 5X/week   Barriers to discharge Decreased caregiver support      Co-evaluation               End of Session Equipment Utilized During Treatment: Gait belt Activity Tolerance: Treatment limited secondary to medical complications (Comment) (Frequent incontinence) Patient left: with call bell/phone within reach;with nursing/sitter in room (On BSC with NT) Nurse Communication: Mobility status PT Visit Diagnosis: Muscle weakness (generalized) (M62.81);Difficulty in walking, not elsewhere classified (R26.2)    Time: 1341-1400 PT Time Calculation (min) (ACUTE ONLY): 19 min   Charges:   PT Evaluation $PT Eval Moderate Complexity: 1 Procedure     PT G Codes:   PT G-Codes **NOT FOR INPATIENT CLASS** Functional Assessment Tool Used: AM-PAC 6 Clicks Basic Mobility Functional Limitation: Mobility: Walking and moving around Mobility: Walking and Moving Around Current Status (Y8502): At least 40 percent but less than 60 percent impaired, limited or restricted Mobility: Walking and Moving Around Goal Status [phone]): At least 1 percent but less than 20 percent impaired, limited or restricted    Elayne Snare, PT, DPT [place] 11/04/2016, 3:59 PM

## 2016-11-04 NOTE — Progress Notes (Signed)
Modified Barium Swallow Progress Note  Patient Details  Name: [name] MRN: [id number] Date of Birth: 01-24-1956  Today's Date: 11/04/2016  Modified Barium Swallow completed.  Full report located under Chart Review in the Imaging Section.  Brief recommendations include the following:  Clinical Impression  Patient presents with mild-moderate oropharyngeal dysphagia which appears consistent with swallowing function observed during previous instrumental examinations. Oral stage remarkable for prolonged mastication, delayed oral transit and decreased bolus cohesion with solid. Pharyngeal stage of swallowing characterized by adequate tongue base retraction, adequate anterior laryngeal movement, decreased laryngeal elevation, and adequate pharyngeal constriction. Swallow initiation delayed to the level of the pyriform sinuses with all consistencies, with the exception of barium pill, puree (valleculae trigger). Pharyngeal space notable for appearance of bony protrusions ~C4-5 which may have contributed to sensory deficits. Consistent penetration of thin and nectar thick liquids occurred during the swallow due to delayed swallow initiation/reduced closure of the laryngeal vestibule. Penetration occurred with all methods of bolus delivery (teaspoon, cup sip, straw, multiple sips), but was deeper with straw sips and multiple consecutive sips. Penetration was typically transient. Pt intermittently expelled deeper penetration with reflexive throat clear, though she required verbal cues to clear penetrate about 50% of the time. Chin tuck was trialed and observed to worsen, not prevent penetration. With barium pill delivered whole in puree, pt with immediate cough response as tablet entered valleculae, which was not associated with penetration or aspiration. An esophageal sweep was performed revealing stasis of the tablet in the upper esophagus. Subsequent swallow of pureed solid was sufficent to move pill  through the distal esophagus. Given the above findings and pt's stable chest x ray (10/31/16), recommend dys 3 diet with thin liquids via single cup sip with immediate throat clear after liquids, medications whole in puree. Pt is noted to be impulsive and given her cognitive deficits, she will require consistent monitoring to employ precautions. SLP will f/u for tolerance, training in compensatory strategies.    Swallow Evaluation Recommendations       SLP Diet Recommendations: Dysphagia 3 (Mech soft) solids;Thin liquid   Liquid Administration via: Cup   Medication Administration: Whole meds with puree   Supervision: Full supervision/cueing for compensatory strategies   Compensations: Slow rate;Small sips/bites;Other (Comment) (Clear throat after liquids)   Postural Changes: Seated upright at 90 degrees   Oral Care Recommendations: Oral care BID      Deneise Lever, [place] CF-SLP Speech-Language Pathologist [phone]  Aliene Altes 11/04/2016,2:19 PM

## 2016-11-04 NOTE — Progress Notes (Signed)
VASCULAR LAB PRELIMINARY  PRELIMINARY  PRELIMINARY  PRELIMINARY  Carotid duplex completed.    Preliminary report:  1-39% ICA stenosis.  Vertebral artery flow is antegrade.   Amri Lien, RVT 11/04/2016, 12:45 PM

## 2016-11-04 NOTE — Progress Notes (Signed)
Report called to Sharon Mt, RN, and pt transferred to 5M11 by Laurena Spies, SWOT RN.

## 2016-11-04 NOTE — Evaluation (Signed)
Speech Language Pathology Evaluation Patient Details Name: [name] MRN: [id number] DOB: 31-Mar-1956 Today's Date: 11/04/2016 Time: 2355-7322 SLP Time Calculation (min) (ACUTE ONLY): 15 min  Problem List:  Patient Active Problem List   Diagnosis Date Noted  . Meningitis 11/02/2016  . Hyperosmolar non-ketotic state in patient with type 2 diabetes mellitus ([place]) 10/31/2016  . Seizure ([place]) 10/31/2016  . ARF (acute renal failure) ([place]) 10/31/2016  . Severe sepsis ([place]) 10/31/2016  . DKA (diabetic ketoacidoses) ([name]) 10/31/2016  . AKI (acute kidney injury) ([place])   . Right hemiparesis ([place]) 06/09/2016  . 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 Cytotoxic brain edema ([place]) 06/08/2016  . Encephalopathy acute 06/06/2016  . Chest pain, musculoskeletal 05/16/2011  . Vaginal candidiasis 05/16/2011  . Hypertension 05/16/2011  . Diabetes mellitus, type 2 ([place]) 05/16/2011  . CHEST PAIN 10/01/2009  . CELLULITIS AND ABSCESS OF LEG EXCEPT FOOT 08/31/2009  . INSOMNIA 07/13/2008  . MRSA 04/17/2008  . NECK MASS 04/17/2008  . COCAINE ABUSE 11/01/2007  . OBESITY NOS 07/14/2006  . DENTAL CARIES 07/14/2006  . DIABETES MELLITUS, TYPE II 04/26/2006  . ALCOHOL ABUSE 04/26/2006  . TOBACCO ABUSE 04/26/2006  . DEPRESSION 04/26/2006  . Essential hypertension 04/26/2006  . GERD 04/26/2006  . LOW BACK PAIN 04/26/2006   Past Medical History:  Past Medical History:  Diagnosis Date  . Diabetes mellitus    Hb A1C = 12.6 on 05/15/11, managed on Novolog 70/30, 35 U qam, 25 U qpm  . Hypertension    poorly controlled  . Shortness of breath    Past Surgical History:  Past Surgical History:  Procedure Laterality Date  . ANKLE FRACTURE SURGERY  20051   HPI:  61 year old female with PMH of DM, HTN, chronic back pain, tobacco/cocaine abuse, CKD III, and recent ICH (right residual hemiparesis). Presents to ED on 4/24 after being found at home with altered mental status. Per report patient typically takes  care of husband and today when home health nurse came to the house, patient was found on the couch with multiple episodes of incontinence. Found ot have DKA, small acute lacunar infarct and right globus pallidus. CXR 4/24 >Slight bibasilar subsegmental atelectasisand mild vascular congestionPt has a history of pontine hemorrhage in 12/17 and cognitive deficits. On 06/10/16 pt obtained score of 23/30 on MOCA form 7.1 with average normal score considered greater than 26. Of note, pt unable to compete math section which affected score. Clinically, pt  demonstrated mild short term memory deficits and requires more than a reasonable amount of time when recalling new information.   Assessment / Plan / Recommendation Clinical Impression  Patient presents with severe cognitive communication deficits worsened from mild impairments at baseline. Administered MOCA form 7.3, pt scored 8/30 (scored 23/30 on a different form of this assessment during 06/2016 admission). Pt with severe deficits in attention (0/6) which appear to impact performance in problem solving, memory and language portions of the examination. Pt had 7 errors in letter tap task, poor storage of words for short term recall. During clock drawing, pt wrote the number 12 twice, realized her error but made the same mistake during her reattempt. Throughout testing, pt with poor eye contact, reduced initiation, delayed processing. She required frequent cues to initiate tasks after instructions given. Recommend skilled ST to address the above deficits in order to maxmize cognition, safety.     SLP Assessment  SLP Recommendation/Assessment: Patient needs continued Speech Lanaguage Pathology Services SLP Visit Diagnosis: Cognitive communication deficit (R41.841);Attention and concentration  deficit;Frontal lobe and executive function deficit Attention and concentration deficit following: Cerebral infarction Frontal lobe and executive function deficit  following: Cerebral infarction    Follow Up Recommendations  Other (comment) (TBD)    Frequency and Duration min 2x/week  2 weeks      SLP Evaluation Cognition  Overall Cognitive Status: Impaired/Different from baseline Arousal/Alertness: Awake/alert Orientation Level: Oriented to person;Oriented to place;Oriented to situation;Oriented X4 Attention: Focused;Sustained Focused Attention: Impaired Focused Attention Impairment: Verbal basic;Functional basic Sustained Attention: Impaired Sustained Attention Impairment: Verbal basic;Functional basic Memory: Impaired Memory Impairment: Storage deficit;Decreased recall of new information Awareness: Impaired Awareness Impairment: Intellectual impairment Problem Solving: Impaired Problem Solving Impairment: Verbal basic;Functional basic Executive Function: Reasoning;Sequencing;Organizing;Self Correcting;Initiating Reasoning: Impaired Reasoning Impairment: Verbal basic;Functional basic Sequencing: Impaired Sequencing Impairment: Verbal basic;Functional basic Organizing: Impaired Organizing Impairment: Verbal basic;Functional basic Initiating: Impaired Initiating Impairment: Verbal basic;Functional basic Self Correcting: Impaired Self Correcting Impairment: Verbal basic;Functional basic Safety/Judgment: Impaired Comments: poor adherance to swallowing precautions despite cues       Comprehension  Auditory Comprehension Overall Auditory Comprehension: Appears within functional limits for tasks assessed Yes/No Questions: Within Functional Limits Commands: Within Functional Limits Conversation: Simple Interfering Components: Attention;Processing speed;Working Field seismologist: Tour manager: Not tested Reading Comprehension Reading Status: Not tested    Expression Expression Primary Mode of Expression: Verbal Verbal Expression Overall Verbal Expression: Other  (comment) (suspect deficits are cognitive in nature (attention)) Initiation: Impaired Repetition: Impaired Level of Impairment: Sentence level Naming: Impairment Convergent: 50-74% accurate Pragmatics: Impairment Impairments: Abnormal affect;Eye contact;Monotone;Dysprosody Interfering Components: Attention Effective Techniques: Open ended questions Non-Verbal Means of Communication: Not applicable Written Expression Dominant Hand: Right Written Expression: Not tested   Oral / Motor  Oral Motor/Sensory Function Overall Oral Motor/Sensory Function: Within functional limits Motor Speech Overall Motor Speech: Appears within functional limits for tasks assessed Respiration: Within functional limits Phonation: Normal Resonance: Within functional limits Articulation: Within functional limitis Intelligibility: Intelligible Motor Planning: Witnin functional limits Motor Speech Errors: Not applicable   GO             Deneise Lever, [place] CF-SLP Speech-Language Pathologist [place] 11/04/2016, 10:00 AM

## 2016-11-04 NOTE — Progress Notes (Signed)
Reported critical value potassium 2.7 to MD. Reported PAC's and PVC's to MD. Pt tolerating well. Will continue to monitor.

## 2016-11-04 NOTE — Progress Notes (Addendum)
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 When her husband's home health nurse came to the house the patient was found altered w/ on the couch w/ evidence of multiple episodes of incontinence. Upon arrival to the ED patient was non-verbal and unable to follow commands. WBC 14.2, Lactic Acid 4.65, Glucose 1200, and pH 7.2.Admitted by PCCM.  Events: 4/24 Admit w/ DKA - CT Head 4/24 >Brain atrophy and chronic white matter microvascular changes - resolution of remote left pontine hemorrhage  4/25 EEG - no evidence of active seizure  4/26 [facility] assumed care   Assessment & Plan:   # CNS infection, possible acute bacterial meningitis:  -Status post LP. Total WBC count 24 with 90% neutrophils which is probably because of receiving antibiotics before lumbar puncture. Follow up culture results. Evaluated by both neurology and infectious disease. Recommended to discontinue acyclovir and ampicillin. ID recommended to continue vancomycin and ceftriaxone for total 7 days. Patient was alert awake. Continue to monitor closely.  # Acute lacunar infarction on the right side: This is likely small vessel disease as opposed to embolic as per neurologist. Patient recently had echo, no repeat echo recommended this time. Follow-up carotid Doppler ultrasound. -Recommended Keppra 500 twice a day for short-term. -Continue aspirin, statin -PT, OT evaluation.  #Altered mental status likely due to combination of meningitis, DKA and CVA: Patient was more alert awake and talking today. She was asking to eat. Apparently patient is currently nothing by mouth because of failed bedside swallow evaluation. Formal swallow  evaluation ongoing. Continue to monitor closely.  #Type 2 diabetes on chronic insulin: Admitted with DKA which was resolved. Monitor blood sugar table. Continue current insulin regimen. -Patient received a.m. dose of Lantus and currently on nothing by mouth. Evaluation ongoing. I discussed with patient's nurse to check blood sugar level more frequently to monitor hypoglycemia. After swallow evaluation patient can take orally. If her blood sugar level is low she may need dextrose.  # Hypertension: Blood pressure elevated. Continue amlodipine, metoprolol. Continue to monitor blood pressure closely.  # Acute on CKD stage 3: serum creatinine level improved. Continue to monitor. Avoid nephrotoxins.   # Hypokalemia and hypokalemia: Repleted both IV and oral potassium and IV magnesium. Repeat lab in the morning.  Left parotid space 2.1 cm soft tissue nodule Worrisome for neoplasm per Radiologist - will need ENT f/u   DVT prophylaxis: Heparin subcutaneous Code Status: Full code Family Communication: No family at bedside Disposition Plan: Unknown at this time.    Consultants:   Neurology  Infectious disease  Initially admitted by PCCM.  Procedures: None Antimicrobials: Vancomycin 4/24 > Zosyn 4/24 > 4/26 Ceftriaxone 4/26 > Subjective: Patient was seen and examined at bedside. Reported diffuse headache. Denied photophobia. No nausea vomiting, chest pain or shortness of breath. Feels dry mouth and thirsty.  Objective: Vitals:   11/04/16 0001 11/04/16 0030 11/04/16 0404 11/04/16 0955  BP: 135/76 (!) 152/88 (!) 154/96 (!) 182/86  Pulse: 100 100 85 82  Resp: 18 18 16 16   Temp: 98.1 F (36.7 C) 98.2 F (36.8 C) 98.6 F (37 C) 98.4 F (36.9 C)  TempSrc: Oral Oral Oral Oral  SpO2: 96% 96%  96% 97%  Weight:      Height:        Intake/Output Summary (Last 24 hours) at 11/04/16 1117 Last data filed at 11/04/16 0600  Gross per 24 hour  Intake          1387.92 ml  Output                 0 ml  Net          1387.92 ml   Filed Weights   10/31/16 1702 11/03/16 2116  Weight: 102.1 kg (225 lb) 103.8 kg (228 lb 13.4 oz)    Examination:  General exam: Appears calm and comfortable , no neck rigidity Respiratory system: Clear to auscultation. Respiratory effort normal. No wheezing or crackle Cardiovascular system: S1 & S2 heard, RRR.  No pedal edema. Gastrointestinal system: Abdomen is nondistended, soft and nontender. Normal bowel sounds heard. Central nervous system: Alert and oriented. No focal neurological deficits. Extremities: Symmetric 5 x 5 power. Skin: No rashes, lesions or ulcers     Data Reviewed: I have personally reviewed following labs and imaging studies  CBC:  Recent Labs Lab 10/31/16 1513 11/01/16 0215 11/02/16 0320 11/03/16 0342 11/04/16 0610  WBC 14.2* 19.3* 19.6* 13.5* 8.8  NEUTROABS 11.8*  --   --   --   --   HGB 16.0* 15.7* 14.4 14.3 12.9  HCT 52.2* 46.2* 43.5 44.6 39.7  MCV 100.6* 91.1 91.8 93.3 92.1  PLT 270 258 194 175 119   Basic Metabolic Panel:  Recent Labs Lab 11/01/16 0215  11/01/16 1329 11/02/16 0320 11/02/16 1906 11/03/16 0342 11/04/16 0525 11/04/16 0610  NA 161*  < > 156*  157* 157* 158* 154*  --  147*  K 2.8*  < > 3.8  3.9 3.4* 3.2* 3.3*  --  2.7*  CL 128*  < > 129*  129* >130* 128* 128*  --  119*  CO2 23  < > 18*  17* 19* 20* 18*  --  20*  GLUCOSE 68  < > 313*  325* 245* 169* 165*  --  78  BUN 35*  < > 25*  25* 22* 21* 17  --  10  CREATININE 2.21*  < > 2.14*  2.17* 2.04* 1.97* 1.68*  --  1.37*  CALCIUM 9.1  < > 8.4*  8.5* 8.6* 9.0 8.3*  --  8.1*  MG 2.3  --   --  2.1  --  2.0 1.6*  --   PHOS <1.0*  --  1.2* 1.4*  --  2.1*  --   --   < > = values in this interval not displayed. GFR: Estimated Creatinine Clearance: 55.8 mL/min (A) (by C-G formula based on SCr of 1.37 mg/dL (H)). Liver Function Tests:  Recent Labs Lab 10/31/16 1513 11/04/16 0610  AST 20 19  ALT 16 14  ALKPHOS 69 37*  BILITOT  1.4* 0.4  PROT 9.0* 6.0*  ALBUMIN 3.4* 2.1*   No results for input(s): LIPASE, AMYLASE in the last 168 hours. No results for input(s): AMMONIA in the last 168 hours. Coagulation Profile:  Recent Labs Lab 10/31/16 1513  INR 1.09   Cardiac Enzymes: No results for input(s): CKTOTAL, CKMB, CKMBINDEX, TROPONINI in the last 168 hours. BNP (last 3 results) No results for input(s): PROBNP in the last 8760 hours. HbA1C: No results for input(s): HGBA1C in the last 72 hours. CBG:  Recent Labs Lab 11/03/16 1636 11/03/16 2158 11/04/16 0000 11/04/16 4174 11/04/16 0814  GLUCAP 213* 132* 150* 74 70   Lipid Profile:  Recent Labs  11/04/16 0610  CHOL 134  HDL 26*  LDLCALC 82  TRIG 131  CHOLHDL 5.2   Thyroid Function Tests: No results for input(s): TSH, T4TOTAL, FREET4, T3FREE, THYROIDAB in the last 72 hours. Anemia Panel: No results for input(s): VITAMINB12, FOLATE, FERRITIN, TIBC, IRON, RETICCTPCT in the last 72 hours. Sepsis Labs:  Recent Labs Lab 10/31/16 1524 10/31/16 1956 10/31/16 2228 11/01/16 0215 11/02/16 0320  PROCALCITON  --  <0.10  --  <0.10 0.11  LATICACIDVEN 4.65* 5.3* 6.3*  --   --     Recent Results (from the past 240 hour(s))  Culture, blood (Routine x 2)     Status: Abnormal   Collection Time: 10/31/16  3:13 PM  Result Value Ref Range Status   Specimen Description BLOOD LEFT HAND  Final   Special Requests   Final    BOTTLES DRAWN AEROBIC AND ANAEROBIC Blood Culture adequate volume   Culture  Setup Time   Final    GRAM POSITIVE COCCI IN CLUSTERS IN BOTH AEROBIC AND ANAEROBIC BOTTLES CRITICAL RESULT CALLED TO, READ BACK BY AND VERIFIED WITH: PHARMD M [place] [phone] MLM    Culture (A)  Final    STAPHYLOCOCCUS SPECIES (COAGULASE NEGATIVE) THE SIGNIFICANCE OF ISOLATING THIS ORGANISM FROM A SINGLE SET OF BLOOD CULTURES WHEN MULTIPLE SETS ARE DRAWN IS UNCERTAIN. PLEASE NOTIFY THE MICROBIOLOGY DEPARTMENT WITHIN ONE WEEK IF SPECIATION AND  SENSITIVITIES ARE REQUIRED.    Report Status 11/03/2016 FINAL  Final  Blood Culture ID Panel (Reflexed)     Status: Abnormal   Collection Time: 10/31/16  3:13 PM  Result Value Ref Range Status   Enterococcus species NOT DETECTED NOT DETECTED Final   Listeria monocytogenes NOT DETECTED NOT DETECTED Final   Staphylococcus species DETECTED (A) NOT DETECTED Final    Comment: Methicillin (oxacillin) resistant coagulase negative staphylococcus. Possible blood culture contaminant (unless isolated from more than one blood culture draw or clinical case suggests pathogenicity). No antibiotic treatment is indicated for blood  culture contaminants. CRITICAL RESULT CALLED TO, READ BACK BY AND VERIFIED WITH: PHARMD M [place] [id number] MLM    Staphylococcus aureus NOT DETECTED NOT DETECTED Final   Methicillin resistance DETECTED (A) NOT DETECTED Final    Comment: CRITICAL RESULT CALLED TO, READ BACK BY AND VERIFIED WITH: PHARMD M [place] [id number] MLM    Streptococcus species NOT DETECTED NOT DETECTED Final   Streptococcus agalactiae NOT DETECTED NOT DETECTED Final   Streptococcus pneumoniae NOT DETECTED NOT DETECTED Final   Streptococcus pyogenes NOT DETECTED NOT DETECTED Final   Acinetobacter baumannii NOT DETECTED NOT DETECTED Final   Enterobacteriaceae species NOT DETECTED NOT DETECTED Final   Enterobacter cloacae complex NOT DETECTED NOT DETECTED Final   Escherichia coli NOT DETECTED NOT DETECTED Final   Klebsiella oxytoca NOT DETECTED NOT DETECTED Final   Klebsiella pneumoniae NOT DETECTED NOT DETECTED Final   Proteus species NOT DETECTED NOT DETECTED Final   Serratia marcescens NOT DETECTED NOT DETECTED Final   Haemophilus influenzae NOT DETECTED NOT DETECTED Final   Neisseria meningitidis NOT DETECTED NOT DETECTED Final   Pseudomonas aeruginosa NOT DETECTED NOT DETECTED Final   Candida albicans NOT DETECTED NOT DETECTED Final   Candida glabrata NOT DETECTED NOT DETECTED Final   Candida  krusei NOT DETECTED NOT DETECTED Final   Candida parapsilosis NOT DETECTED NOT DETECTED Final   Candida tropicalis NOT DETECTED NOT DETECTED Final  Culture, blood (Routine x  2)     Status: None (Preliminary result)   Collection Time: 10/31/16  3:27 PM  Result Value Ref Range Status   Specimen Description BLOOD LEFT ANTECUBITAL  Final   Special Requests IN PEDIATRIC BOTTLE Blood Culture adequate volume  Final   Culture NO GROWTH 3 DAYS  Final   Report Status PENDING  Incomplete  Culture, Urine     Status: None   Collection Time: 10/31/16  7:43 PM  Result Value Ref Range Status   Specimen Description URINE, CATHETERIZED  Final   Special Requests NONE  Final   Culture NO GROWTH  Final   Report Status 11/02/2016 FINAL  Final  MRSA PCR Screening     Status: None   Collection Time: 10/31/16  9:24 PM  Result Value Ref Range Status   MRSA by PCR NEGATIVE NEGATIVE Final    Comment:        The GeneXpert MRSA Assay (FDA approved for NASAL specimens only), is one component of a comprehensive MRSA colonization surveillance program. It is not intended to diagnose MRSA infection nor to guide or monitor treatment for MRSA infections.   Respiratory Panel by PCR     Status: None   Collection Time: 11/01/16 10:03 AM  Result Value Ref Range Status   Adenovirus NOT DETECTED NOT DETECTED Final   Coronavirus 229E NOT DETECTED NOT DETECTED Final   Coronavirus HKU1 NOT DETECTED NOT DETECTED Final   Coronavirus NL63 NOT DETECTED NOT DETECTED Final   Coronavirus OC43 NOT DETECTED NOT DETECTED Final   Metapneumovirus NOT DETECTED NOT DETECTED Final   Rhinovirus / Enterovirus NOT DETECTED NOT DETECTED Final   Influenza A NOT DETECTED NOT DETECTED Final   Influenza B NOT DETECTED NOT DETECTED Final   Parainfluenza Virus 1 NOT DETECTED NOT DETECTED Final   Parainfluenza Virus 2 NOT DETECTED NOT DETECTED Final   Parainfluenza Virus 3 NOT DETECTED NOT DETECTED Final   Parainfluenza Virus 4 NOT DETECTED  NOT DETECTED Final   Respiratory Syncytial Virus NOT DETECTED NOT DETECTED Final   Bordetella pertussis NOT DETECTED NOT DETECTED Final   Chlamydophila pneumoniae NOT DETECTED NOT DETECTED Final   Mycoplasma pneumoniae NOT DETECTED NOT DETECTED Final  CSF culture     Status: None (Preliminary result)   Collection Time: 11/02/16  3:19 PM  Result Value Ref Range Status   Specimen Description CSF  Final   Special Requests ADDED [id number] [phone]  Final   Gram Stain   Final    WBC PRESENT, PREDOMINANTLY PMN NO ORGANISMS SEEN CYTOSPIN SMEAR    Culture NO GROWTH 1 DAY  Final   Report Status PENDING  Incomplete  Culture, blood (Routine X 2) w Reflex to ID Panel     Status: None (Preliminary result)   Collection Time: 11/02/16  7:04 PM  Result Value Ref Range Status   Specimen Description BLOOD RIGHT HAND  Final   Special Requests IN PEDIATRIC BOTTLE Blood Culture adequate volume  Final   Culture NO GROWTH < 24 HOURS  Final   Report Status PENDING  Incomplete  Culture, blood (Routine X 2) w Reflex to ID Panel     Status: None (Preliminary result)   Collection Time: 11/02/16  7:04 PM  Result Value Ref Range Status   Specimen Description BLOOD LEFT HAND  Final   Special Requests   Final    BOTTLES DRAWN AEROBIC AND ANAEROBIC Blood Culture adequate volume   Culture NO GROWTH < 24 HOURS  Final   Report  Status PENDING  Incomplete         Radiology Studies: Mr Brain 20 Contrast  Result Date: 11/02/2016 CLINICAL DATA:  61 year old female found down with confusion. Prior small vessel disease including brainstem hemorrhage in 2017. Questionable acute lacunar infarct of the medial right basal ganglia on limited study yesterday. EXAM: MRI HEAD WITHOUT CONTRAST TECHNIQUE: Multiplanar, multiecho pulse sequences of the brain and surrounding structures were obtained without intravenous contrast. COMPARISON:  Brain MRI 11/01/2016 and earlier. FINDINGS: Brain: Subcentimeter focus of restricted diffusion  again suspected at the right deep gray matter nuclei, epicenter at the globus pallidus near the junction with the anterior right thalamus and internal capsule (series 4, image 27). No associated hemorrhage or mass effect. Mild if any associated T2 and FLAIR hyperintensity. No other restricted diffusion. Expected evolution of the pontine hemorrhage since November with hemosiderin and encephalomalacia. Abnormal T2 and FLAIR hyperintensity has developed in the bilateral middle cerebellar peduncles. Underlying chronic right pontine lacune. Chronic heterogeneity in the left thalamus is stable. Cerebral hemisphere gray and white matter signal appears stable and within normal limits. No midline shift, mass effect, evidence of mass lesion, ventriculomegaly, extra-axial collection or acute intracranial hemorrhage. Cervicomedullary junction and pituitary are within normal limits. Vascular: Major intracranial vascular flow voids are stable since 2017. Skull and upper cervical spine: Anterior cervical bulky osteophytosis. Visualized bone marrow signal is within normal limits. Sinuses/Orbits: Stable and negative. Other: Visible internal auditory structures appear normal. Mastoids remain clear. Stable posterior inferior left parotid space soft tissue nodule measuring 21 mm since 2017 (series 3, image 24). IMPRESSION: 1. Small acute lacunar infarct along the confluence of the right globus pallidus. No associated hemorrhage or mass effect. 2. Expected evolution of the left brainstem hemorrhage since November. 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 3. 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 4. 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 ENT follow-up again recommended. Electronically Signed   By: Genevie Ann M.D.   On: 11/02/2016 12:47        Scheduled Meds: . amLODipine  10 mg Oral Daily  .  aspirin  81 mg Oral Daily  . atorvastatin  40 mg Oral q1800  . folic acid  1 mg Oral Daily  . heparin  5,000 Units Subcutaneous Q8H  . insulin aspart  0-20 Units Subcutaneous TID WC  . insulin aspart  0-5 Units Subcutaneous QHS  . insulin glargine  30 Units Subcutaneous BID  . metoCLOPramide (REGLAN) injection  10 mg Intravenous Once  . metoprolol  5 mg Intravenous Q12H  . multivitamin with minerals  1 tablet Oral Daily  . nitroGLYCERIN  1 inch Topical Q6H  . pantoprazole  40 mg Oral Q1200  . potassium chloride  40 mEq Oral BID   Continuous Infusions: . sodium chloride 50 mL/hr at 11/04/16 0507  . cefTRIAXone (ROCEPHIN)  IV 2 g (11/04/16 1015)  . levETIRAcetam Stopped (11/04/16 0110)  . magnesium sulfate 1 - 4 g bolus IVPB    . vancomycin Stopped (11/03/16 2200)     LOS: 4 days    Shaima Sardinas Tanna Furry, MD [facility] Pager [phone]  If 7PM-7AM, please contact night-coverage [email or web address] Password [facility] 11/04/2016, 11:17 AM

## 2016-11-04 NOTE — Evaluation (Signed)
Occupational Therapy Evaluation Patient Details Name: [name] MRN: [id number] DOB: 1956-07-08 Today's Date: 11/04/2016    History of Present Illness Pt is a 61 y.o. female who presented to the ED on 4/24 after being found at home with AMS. When her husband's home health nurse came to the house the patient was found altered on the couch with evidence of multiple episodes of incontinence. She was non-verbal and unable to follow commands on arrival. CT head 4/24 revealed brain atrophy and chronic while matter microvascular changes. MRI revealed small acute lacunar infarct along the confluence of the R globus pallidus. EEG revealed no active seizure 4/25. LP completed 4/26.   Clinical Impression   PTA, pt reports independence with ADL and caring for her significant other at home. Pt currently requires moderate assistance with UB ADL due to difficulty problem solving and min assist with toilet transfers. She presents with decreased cognition, generalized weakness, and decreased activity tolerance impacting ability to complete ADL at PLOF. On OT arrival, pt with bowel movement in bed with no awareness and demonstrated impulsivity making her unsafe during attempts to complete toileting hygiene. Pt would benefit from OT services while admitted in order to improve independence with ADL and functional mobility. Recommend CIR placement post-acute D/C in order to maximize return to PLOF. Will continue to follow acutely.    Follow Up Recommendations  CIR;Supervision/Assistance - 24 hour    Equipment Recommendations  Other (comment) (TBD at next venue of care)    Recommendations for Other Services Rehab consult     Precautions / Restrictions Precautions Precautions: Fall Restrictions Weight Bearing Restrictions: No      Mobility Bed Mobility Overal bed mobility: Needs Assistance Bed Mobility: Supine to Sit;Sit to Supine     Supine to sit: Min assist Sit to supine: Min guard   General  bed mobility comments: Min assist to raise trunk and motor plan process of sitting up from bed.  Transfers Overall transfer level: Needs assistance Equipment used: Rolling walker (2 wheeled) Transfers: Sit to/from Stand Sit to Stand: Min assist         General transfer comment: Min assist for lifting and steadying    Balance Overall balance assessment: Needs assistance Sitting-balance support: No upper extremity supported;Feet supported;Bilateral upper extremity supported Sitting balance-Leahy Scale: Fair Sitting balance - Comments: Leaning over onto elbows   Standing balance support: Bilateral upper extremity supported;During functional activity;No upper extremity supported Standing balance-Leahy Scale: Poor Standing balance comment: Reliant on RW or external support for balance in standing.                           ADL either performed or assessed with clinical judgement   ADL Overall ADL's : Needs assistance/impaired     Grooming: Minimal assistance;Standing   Upper Body Bathing: Sitting;Minimal assistance   Lower Body Bathing: Minimal assistance;Sit to/from stand   Upper Body Dressing : Moderate assistance;Sitting Upper Body Dressing Details (indicate cue type and reason): Mod assist due to decreased problem solving when attempting to don gown. Lower Body Dressing: Minimal assistance;Sit to/from stand   Toilet Transfer: Minimal assistance;Ambulation;RW;BSC   Toileting- Clothing Manipulation and Hygiene: Minimal assistance;Sit to/from stand Toileting - Clothing Manipulation Details (indicate cue type and reason): Pt laying in stool on arrival without awareness.     Functional mobility during ADLs: Minimal assistance;Rolling walker General ADL Comments: Problem solving and cognitive deficits primarily affecting ADL independence.      Vision Baseline Vision/History:  Wears glasses Wears Glasses: Reading only Patient Visual Report: No change from  baseline Additional Comments: Will continue to assess. No apparent deficits functionally.     Perception     Praxis      Pertinent Vitals/Pain Pain Assessment: No/denies pain     Hand Dominance Right   Extremity/Trunk Assessment Upper Extremity Assessment Upper Extremity Assessment: Generalized weakness   Lower Extremity Assessment Lower Extremity Assessment: Generalized weakness       Communication Communication Communication: No difficulties   Cognition Arousal/Alertness: Lethargic Behavior During Therapy: Impulsive Overall Cognitive Status: Impaired/Different from baseline Area of Impairment: Attention;Memory;Following commands;Safety/judgement;Awareness;Problem solving                   Current Attention Level: Sustained Memory: Decreased short-term memory Following Commands: Follows one step commands inconsistently Safety/Judgement: Decreased awareness of safety;Decreased awareness of deficits Awareness: Intellectual Problem Solving: Slow processing;Decreased initiation;Difficulty sequencing;Requires verbal cues;Requires tactile cues General Comments: Pt impulsively standing on evaluation due to need to urinate. Pt unable to consistently follow commands for safety despite multimodal cues.    General Comments       Exercises     Shoulder Instructions      Home Living Family/patient expects to be discharged to:: Private residence Living Arrangements: Spouse/significant other Available Help at Discharge: Friend(s);Family;Available PRN/intermittently Type of Home: House Home Access: Level entry     Home Layout: One level     Bathroom Shower/Tub: Teacher, early years/pre: Standard     Home Equipment:  (all DME for significant other)      Lives With: Significant other    Prior Functioning/Environment Level of Independence: Independent        Comments: Takes care of her significant other providing physical assistance for his ADL.         OT Problem List: Decreased strength;Decreased activity tolerance;Impaired balance (sitting and/or standing);Decreased cognition;Decreased safety awareness;Decreased knowledge of use of DME or AE;Decreased knowledge of precautions      OT Treatment/Interventions: Self-care/ADL training;Therapeutic exercise;Energy conservation;DME and/or AE instruction;Therapeutic activities;Cognitive remediation/compensation;Patient/family education;Balance training    OT Goals(Current goals can be found in the care plan section) Acute Rehab OT Goals Patient Stated Goal: to get something to eat OT Goal Formulation: With patient Time For Goal Achievement: 11/18/16 Potential to Achieve Goals: Good ADL Goals Pt Will Perform Grooming: with modified independence;standing (including gathering items) Pt Will Perform Upper Body Dressing: with modified independence;sitting Pt Will Perform Lower Body Dressing: with modified independence;sit to/from stand Pt Will Transfer to Toilet: with modified independence;ambulating;regular height toilet Pt Will Perform Toileting - Clothing Manipulation and hygiene: with modified independence;sit to/from stand Additional ADL Goal #1: Pt will demonstrate anticipatory awareness in a moderately distracting environment during morning ADL routine.  OT Frequency: Min 3X/week   Barriers to D/C:            Co-evaluation              End of Session Equipment Utilized During Treatment: Gait belt;Rolling walker Nurse Communication: Mobility status  Activity Tolerance: Patient tolerated treatment well Patient left: in bed;with call bell/phone within reach;with bed alarm set;with SCD's reapplied  OT Visit Diagnosis: Unsteadiness on feet (R26.81);Muscle weakness (generalized) (M62.81);Other symptoms and signs involving cognitive function                Time: 3382-5053 OT Time Calculation (min): 28 min Charges:  OT General Charges $OT Visit: 1 Procedure OT  Evaluation $OT Eval Moderate Complexity: 1 Procedure OT Treatments $Self Care/Home  Management : 8-22 mins G-Codes:     Norman Herrlich, MS OTR/L  Pager: [place] A Quianna Avery 11/04/2016, 12:01 PM

## 2016-11-05 DIAGNOSIS — G0490 Encephalitis and encephalomyelitis, unspecified: Secondary | ICD-10-CM

## 2016-11-05 DIAGNOSIS — R410 Disorientation, unspecified: Secondary | ICD-10-CM

## 2016-11-05 DIAGNOSIS — I10 Essential (primary) hypertension: Secondary | ICD-10-CM

## 2016-11-05 DIAGNOSIS — E7251 Non-ketotic hyperglycinemia: Secondary | ICD-10-CM

## 2016-11-05 LAB — HEMOGLOBIN A1C
HEMOGLOBIN A1C: 13.7 % — AB (ref 4.8–5.6)
Mean Plasma Glucose: 346 mg/dL

## 2016-11-05 LAB — RENAL FUNCTION PANEL
Albumin: 2.3 g/dL — ABNORMAL LOW (ref 3.5–5.0)
Anion gap: 7 (ref 5–15)
BUN: 6 mg/dL (ref 6–20)
CALCIUM: 8.3 mg/dL — AB (ref 8.9–10.3)
CHLORIDE: 117 mmol/L — AB (ref 101–111)
CO2: 19 mmol/L — AB (ref 22–32)
CREATININE: 1.28 mg/dL — AB (ref 0.44–1.00)
GFR calc non Af Amer: 45 mL/min — ABNORMAL LOW (ref >60)
GFR, EST AFRICAN AMERICAN: 52 mL/min — AB (ref >60)
GLUCOSE: 97 mg/dL (ref 65–99)
Phosphorus: 3.3 mg/dL (ref 2.5–4.6)
Potassium: 4.9 mmol/L (ref 3.5–5.1)
SODIUM: 143 mmol/L (ref 135–145)

## 2016-11-05 LAB — CULTURE, BLOOD (ROUTINE X 2)
Culture: NO GROWTH
Special Requests: ADEQUATE

## 2016-11-05 LAB — GLUCOSE, CAPILLARY
GLUCOSE-CAPILLARY: 250 mg/dL — AB (ref 65–99)
GLUCOSE-CAPILLARY: 262 mg/dL — AB (ref 65–99)
Glucose-Capillary: 105 mg/dL — ABNORMAL HIGH (ref 65–99)
Glucose-Capillary: 211 mg/dL — ABNORMAL HIGH (ref 65–99)

## 2016-11-05 LAB — MAGNESIUM: Magnesium: 1.9 mg/dL (ref 1.7–2.4)

## 2016-11-05 MED ORDER — LISINOPRIL 20 MG PO TABS
20.0000 mg | ORAL_TABLET | Freq: Every day | ORAL | Status: DC
  Administered 2016-11-05 – 2016-11-06 (×2): 20 mg via ORAL
  Filled 2016-11-05 (×2): qty 1

## 2016-11-05 MED ORDER — METOPROLOL TARTRATE 25 MG PO TABS
25.0000 mg | ORAL_TABLET | Freq: Two times a day (BID) | ORAL | Status: DC
Start: 2016-11-05 — End: 2016-11-06
  Administered 2016-11-05 – 2016-11-06 (×3): 25 mg via ORAL
  Filled 2016-11-05 (×3): qty 1

## 2016-11-05 MED ORDER — DIPHENHYDRAMINE HCL 50 MG/ML IJ SOLN
50.0000 mg | Freq: Once | INTRAMUSCULAR | Status: AC
  Administered 2016-11-05: 50 mg via INTRAVENOUS
  Filled 2016-11-05: qty 1

## 2016-11-05 MED ORDER — POTASSIUM CHLORIDE CRYS ER 20 MEQ PO TBCR: 20.0000 meq | EXTENDED_RELEASE_TABLET | Freq: Every day | ORAL | Status: DC

## 2016-11-05 MED ORDER — ENOXAPARIN SODIUM 40 MG/0.4ML ~~LOC~~ SOLN
40.0000 mg | SUBCUTANEOUS | Status: DC
  Administered 2016-11-05 – 2016-11-06 (×2): 40 mg via SUBCUTANEOUS
  Filled 2016-11-05 (×2): qty 0.4

## 2016-11-05 NOTE — Progress Notes (Signed)
Patient requesting something to help her sleep tonight. Patient states she takes Asvil PM at home for sleep. TRH paged. RN will continue to monitor patient.

## 2016-11-05 NOTE — Progress Notes (Addendum)
  Speech Language Pathology Treatment: Dysphagia;Cognitive-Linquistic  Patient Details Name: [name] MRN: [id number] DOB: [birth year] Today's Date: 11/05/2016 Time: 1140-1200 SLP Time Calculation (min) (ACUTE ONLY): 20 min  Assessment / Plan / Recommendation Clinical Impression  Pt seen for dysphagia and cognitive linguistic treatment. Swallow consult reordered after completion of MBS yesterday; RN states pt coughing with meals. Dysphagia treatment: Straw noted in cup on pt's tray upon SLP entry, which was removed. SLP instructed pt to utilize strategies of slow rate, single sips, no straw, throat clear after liquids with trials of thin liquids, regular solids. Pt implements throat clear with 80% accuracy independently, requires min-mod cues in remainder of opportunities. Lunch tray arrived; pt noted with rapid rate of intake, impulsivity with mechanical soft solids, resulting in immediate cough x1, suggestive of reduced airway protection. Pt required min verbal cues to slow rate of intake; no further signs of aspiration noted; pt continued her use of compensatory throat clear after swallows with modified independence. Recommend continuing current diet of dysphagia 3 (soft) with thin liquids, NO STRAWS, supervision to monitor and cue for compensatory strategies. SLP will continue to follow, train in use of compensatory techniques to improve swallow function.  Cognitive-linguistic treatment: SLP facilitated pt recall of swallowing precautions; she states 2/4 precautions independently, requires mod A to name additional strategies. Pt displays poor insight/awareness of deficits, stating "I didn't realize I was this sick," states she needs to return home to take care of her husband. SLP educated re: deficit areas which would prevent patient from performing her typical tasks safely.     HPI HPI: 61 year old female with PMH of DM, HTN, chronic back pain, tobacco/cocaine abuse, CKD III, and recent ICH  (right residual hemiparesis). Presents to ED on 4/24 after being found at home with altered mental status. Per report patient typically takes care of husband and today when home health nurse came to the house, patient was found on the couch with multiple episodes of incontinence. Found ot have DKA, small acute lacunar infarct and right globus pallidus. CXR 4/24 >Slight bibasilar subsegmental atelectasisand mild vascular congestionPt has a history of pontine hemorrhage in 12/17 and had MBS at that time showing delayed swallow with aspiration of thin and trace aspiration of nectar. Pt recommended to consume nectar thick liquids with therapy to train a breath hold with swallowing.       SLP Plan  Continue with current plan of care       Recommendations  Diet recommendations: Dysphagia 3 (mechanical soft);Thin liquid Liquids provided via: Cup;No straw Medication Administration: Crushed with puree Supervision: Full supervision/cueing for compensatory strategies Compensations: Slow rate;Small sips/bites;Other (Comment) (clear throat after liquids) Postural Changes and/or Swallow Maneuvers: Seated upright 90 degrees                Oral Care Recommendations: Oral care BID Follow up Recommendations: Inpatient Rehab SLP Visit Diagnosis: Dysphagia, oropharyngeal phase (R13.12);Cognitive communication deficit (R41.841) Attention and concentration deficit following: Cerebral infarction Frontal lobe and executive function deficit following: Cerebral infarction Plan: Continue with current plan of care       [place], MS CF-SLP Speech-Language Pathologist [phone]   Aliene Altes 11/05/2016, 12:56 PM

## 2016-11-05 NOTE — Consult Note (Signed)
Physical Medicine and Rehabilitation Consult Reason for Consult:CVA Referring Phsyician: [name] is an 61 y.o. female.   HPI: Patient admitted on 10/31/2016 for altered mental status and incontinence. Evidence of hyperosmolar nonketotic state with encephalopathy. Also felt to have sepsis and empiric antibiotics were started. Patient also had evidence of acute on chronic renal failure with shock kidney. There was some twitching of the left face, left upper extremity with possible seizure. Seizures were treated with Keppra and felt to be related to severe hyperglycemia. 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 MRI on 11/02/2016 demonstrated globus pallidus. Acute lacunar infarct on the right side. 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 Other workup included lumbar puncture demonstrating pleocytosis started on antibiotics. ID on consult. 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 CIR stay, attending physician, Dr. Naaman Plummer, 12 1 through 06/20/2016, discharged at a level of supervision for transfers, min assist without assisted device for ambulation. Min assist. ADLs. Patient with residual right hemiparesis.  Review of Systems  Constitutional: Negative for chills and weight loss.  HENT: Positive for hearing loss. Negative for congestion.   Eyes: Positive for blurred vision. Negative for double vision.  Respiratory: Positive for cough and sputum production. Negative for hemoptysis, shortness of breath, wheezing and stridor.   Cardiovascular: Negative for chest pain and leg swelling.  Gastrointestinal: Positive for heartburn. Negative for abdominal pain, nausea and vomiting.  Genitourinary: Negative for dysuria and flank pain.   Musculoskeletal: Positive for joint pain. Negative for back pain and myalgias.  Skin: Negative for itching and rash.  Neurological: Positive for tingling, weakness and headaches. Negative for focal weakness.  Endo/Heme/Allergies: Negative for polydipsia. Does not bruise/bleed easily.  Psychiatric/Behavioral: Positive for substance abuse. Negative for depression.    Past Medical History:  Diagnosis Date  . Diabetes mellitus    Hb A1C = 12.6 on 05/15/11, managed on Novolog 70/30, 35 U qam, 25 U qpm  . Hypertension    poorly controlled  . Shortness of breath    Past Surgical History:  Procedure Laterality Date  . ANKLE FRACTURE SURGERY  2007   No family history on file. Social History:  reports that she has been smoking.  She has a 12.50 pack-year smoking history. She has never used smokeless tobacco. She reports that she drinks about 7.2 oz of alcohol per week . She reports that she uses drugs, including Cocaine. Allergies: No Known Allergies Medications Prior to Admission  Medication Sig Dispense Refill  . amLODipine (NORVASC) 10 MG tablet Take 1 tablet (10 mg total) by mouth daily. 30 tablet 1  . cyclobenzaprine (FLEXERIL) 10 MG tablet Take 1 tablet (10 mg total) by mouth 2 (two) times daily as needed for muscle spasms. 60 tablet 0  . folic acid (FOLVITE) 1 MG tablet Take 1 tablet (1 mg total) by mouth daily. 30 tablet 0  . gabapentin (NEURONTIN) 300 MG capsule Take 2 capsules (600 mg total) by mouth 4 (four) times daily. 180 capsule 0  . guaiFENesin (MUCINEX) 600 MG 12 hr tablet Take 1 tablet (600 mg total) by mouth 2 (two) times daily. 60 tablet 0  . levETIRAcetam (KEPPRA) 1000 MG tablet Take 1 tablet (1,000 mg total) by mouth every 12 (twelve) hours. 60 tablet 0  . lisinopril-hydrochlorothiazide (PRINZIDE,ZESTORETIC) 20-12.5 MG tablet Take 1  tablet by mouth daily.    . metoprolol tartrate (LOPRESSOR) 25 MG tablet Take 1 tablet (25 mg total) by mouth 2 (two) times daily. 60 tablet 0   . NOREL AD 4-10-325 MG TABS Take 1 tablet by mouth 2 (two) times daily as needed for allergies.  0  . pantoprazole (PROTONIX) 40 MG tablet Take 1 tablet (40 mg total) by mouth daily. 30 tablet 0  . PROAIR HFA 108 (90 Base) MCG/ACT inhaler Inhale 2 puffs into the lungs 4 (four) times daily as needed for wheezing.  0  . simvastatin (ZOCOR) 10 MG tablet Take 1 tablet (10 mg total) by mouth at bedtime. 30 tablet 0  . sitaGLIPtin-metformin (JANUMET) 50-500 MG tablet Take 1 tablet by mouth 2 (two) times daily with a meal.    . traMADol (ULTRAM) 50 MG tablet Take 1 tablet (50 mg total) by mouth daily as needed (for pain). 20 tablet 0  . traZODone (DESYREL) 100 MG tablet Take 1 tablet (100 mg total) by mouth at bedtime. 30 tablet 0  . insulin aspart protamine- aspart (NOVOLOG MIX 70/30) (70-30) 100 UNIT/ML injection Inject 0.33 mLs (33 Units total) into the skin daily with supper. (Patient not taking: Reported on 11/01/2016) 10 mL 11  . insulin aspart protamine- aspart (NOVOLOG MIX 70/30) (70-30) 100 UNIT/ML injection Inject 0.53 mLs (53 Units total) into the skin daily with breakfast. (Patient not taking: Reported on 11/01/2016) 10 mL 11  . Maltodextrin-Xanthan Gum (RESOURCE THICKENUP CLEAR) POWD Take 360 g by mouth as needed. 3 Can 1  . Multiple Vitamin (MULTIVITAMIN WITH MINERALS) TABS tablet Take 1 tablet by mouth daily.    . nicotine (NICODERM CQ - DOSED IN MG/24 HOURS) 14 mg/24hr patch 14 mg patch daily 2 weeks then 7 mg patch daily 3 weeks and stop 28 patch 0    Home: Home Living Family/patient expects to be discharged to:: Private residence Living Arrangements: Spouse/significant other Available Help at Discharge: Friend(s), Family, Available PRN/intermittently Type of Home: House Home Access: Level entry Home Layout: One level Bathroom Shower/Tub: Chiropodist: Standard Home Equipment: None (all DME for significant other)  Lives With: Significant other  Functional  History: Prior Function Comments: Takes care of her significant other providing physical assistance for his ADL. Functional Status:  Mobility:     Ambulation/Gait Ambulation Distance (Feet): 15 Feet Gait velocity: decreased General Gait Details: Intermittent min assist for balance, distance limited to bedroom due to incontinence of bowel and stool. Assist with walker navigation and proximity for safety.    ADL: ADL Upper Body Dressing Details (indicate cue type and reason): Mod assist due to decreased problem solving when attempting to don gown. Toileting - Clothing Manipulation Details (indicate cue type and reason): Pt laying in stool on arrival without awareness.  Cognition: Cognition Overall Cognitive Status: Impaired/Different from baseline Arousal/Alertness: Awake/alert Orientation Level: Oriented to person, Oriented to place, Oriented to time Attention: Focused, Sustained Focused Attention: Impaired Focused Attention Impairment: Verbal basic, Functional basic Sustained Attention: Impaired Sustained Attention Impairment: Verbal basic, Functional basic Memory: Impaired Memory Impairment: Storage deficit, Decreased recall of new information Awareness: Impaired Awareness Impairment: Intellectual impairment Problem Solving: Impaired Problem Solving Impairment: Verbal basic, Functional basic Executive Function: Reasoning, Sequencing, Organizing, Self Correcting, Initiating Reasoning: Impaired Reasoning Impairment: Verbal basic, Functional basic Sequencing: Impaired Sequencing Impairment: Verbal basic, Functional basic Organizing: Impaired Organizing Impairment: Verbal basic, Functional basic Initiating: Impaired Initiating Impairment: Verbal basic, Functional basic Self Correcting: Impaired Self Correcting Impairment: Verbal basic, Functional  basic Safety/Judgment: Impaired Comments: poor adherance to swallowing precautions despite cues Cognition Arousal/Alertness:  Lethargic Behavior During Therapy: Impulsive Overall Cognitive Status: Impaired/Different from baseline Area of Impairment: Attention, Memory, Following commands, Safety/judgement, Awareness, Problem solving Current Attention Level: Sustained Memory: Decreased short-term memory Following Commands: Follows one step commands consistently Safety/Judgement: Decreased awareness of safety, Decreased awareness of deficits Awareness: Intellectual Problem Solving: Slow processing, Decreased initiation, Difficulty sequencing, Requires verbal cues, Requires tactile cues General Comments: Responds to simple commands i.e. walk to the sink, wash your hands, etc.  Blood pressure (!) 189/95, pulse 99, temperature 98.6 F (37 C), temperature source Oral, resp. rate 18, height 5\' 8"  (1.727 m), weight 103.8 kg (228 lb 13.4 oz), SpO2 96 %. Physical Exam  Nursing note and vitals reviewed. Constitutional: She is oriented to person, place, and time. She appears well-developed.  Obese   HENT:  Head: Normocephalic and atraumatic.  Eyes: Conjunctivae and EOM are normal. Pupils are equal, round, and reactive to light.  Neck: Normal range of motion.  Cardiovascular: Normal rate, regular rhythm and normal heart sounds.  Exam reveals no friction rub.   No murmur heard. Respiratory: Effort normal and breath sounds normal. No respiratory distress. She has no wheezes.  GI: Soft. Bowel sounds are normal. She exhibits no distension. There is no tenderness.  Musculoskeletal: She exhibits no edema, tenderness or deformity.  Neurological: She is alert and oriented to person, place, and time.  Motor strength is 4/5, bilateral deltoid, biceps, triceps, grip, hip flexion, knee extensor, ankle dorsiflexor Remembers 3/3 unrelated objects after delayed. Sensation intact to light touch bilateral upper and lower limbs  Psychiatric: She has a normal mood and affect. Thought content normal. Her speech is delayed. She is slowed.     Results for orders placed or performed during the hospital encounter of 10/31/16 (from the past 24 hour(s))  Glucose, capillary     Status: None   Collection Time: 11/04/16 11:49 AM  Result Value Ref Range   Glucose-Capillary 79 65 - 99 mg/dL  Glucose, capillary     Status: Abnormal   Collection Time: 11/04/16  1:09 PM  Result Value Ref Range   Glucose-Capillary 125 (H) 65 - 99 mg/dL  Glucose, capillary     Status: Abnormal   Collection Time: 11/04/16  4:49 PM  Result Value Ref Range   Glucose-Capillary 212 (H) 65 - 99 mg/dL  Glucose, capillary     Status: Abnormal   Collection Time: 11/04/16  9:10 PM  Result Value Ref Range   Glucose-Capillary 124 (H) 65 - 99 mg/dL   Comment 1 Notify RN    Comment 2 Document in Chart   Renal function panel     Status: Abnormal   Collection Time: 11/05/16  3:53 AM  Result Value Ref Range   Sodium 143 135 - 145 mmol/L   Potassium 4.9 3.5 - 5.1 mmol/L   Chloride 117 (H) 101 - 111 mmol/L   CO2 19 (L) 22 - 32 mmol/L   Glucose, Bld 97 65 - 99 mg/dL   BUN 6 6 - 20 mg/dL   Creatinine, Ser 1.28 (H) 0.44 - 1.00 mg/dL   Calcium 8.3 (L) 8.9 - 10.3 mg/dL   Phosphorus 3.3 2.5 - 4.6 mg/dL   Albumin 2.3 (L) 3.5 - 5.0 g/dL   GFR calc non Af Amer 45 (L) >60 mL/min   GFR calc Af Amer 52 (L) >60 mL/min   Anion gap 7 5 - 15  Magnesium  Status: None   Collection Time: 11/05/16  3:53 AM  Result Value Ref Range   Magnesium 1.9 1.7 - 2.4 mg/dL  Glucose, capillary     Status: Abnormal   Collection Time: 11/05/16  6:38 AM  Result Value Ref Range   Glucose-Capillary 105 (H) 65 - 99 mg/dL   Comment 1 Notify RN    Comment 2 Document in Chart    Dg Swallowing Func-speech Pathology  Result Date: 11/04/2016 Objective Swallowing Evaluation: Type of Study: MBS-Modified Barium Swallow Study Patient Details Name: [name] MRN: [id number] Date of Birth: 1955/07/17 Today's Date: 11/04/2016 Time: SLP Start Time (ACUTE ONLY): 1200-SLP Stop Time (ACUTE ONLY):  1220 SLP Time Calculation (min) (ACUTE ONLY): 20 min Past Medical History: Past Medical History: Diagnosis Date . Diabetes mellitus   Hb A1C = 12.6 on 05/15/11, managed on Novolog 70/30, 35 U qam, 25 U qpm . Hypertension   poorly controlled . Shortness of breath  Past Surgical History: Past Surgical History: Procedure Laterality Date . ANKLE FRACTURE SURGERY  4969 HPI: 61 year old female with PMH of DM, HTN, chronic back pain, tobacco/cocaine abuse, CKD III, and recent ICH (right residual hemiparesis). Presents to ED on 4/24 after being found at home with altered mental status. Per report patient typically takes care of husband and today when home health nurse came to the house, patient was found on the couch with multiple episodes of incontinence. Found ot have DKA, small acute lacunar infarct and right globus pallidus. CXR 4/24 >Slight bibasilar subsegmental atelectasisand mild vascular congestionPt has a history of pontine hemorrhage in 12/17 and had MBS at that time showing delayed swallow with aspiration of thin and trace aspiration of nectar. Pt recommended to consume nectar thick liquids with therapy to train a breath hold with swallowing.  Subjective: Alert, pleasant Assessment / Plan / Recommendation CHL IP CLINICAL IMPRESSIONS 11/04/2016 Clinical Impression Patient presents with mild-moderate oropharyngeal dysphagia which appears consistent with swallowing function observed during previous instrumental examinations. Oral stage remarkable for prolonged mastication, delayed oral transit and decreased bolus cohesion with solid. Pharyngeal stage of swallowing characterized by adequate tongue base retraction, adequate anterior laryngeal movement, decreased laryngeal elevation, and adequate pharyngeal constriction. Swallow initiation delayed to the level of the pyriform sinuses with all consistencies, with the exception of barium pill, puree (valleculae trigger). Pharyngeal space notable for appearance of bony  protrusions ~C4-5 which may have contributed to sensory deficits. Consistent penetration of thin and nectar thick liquids occurred during the swallow due to delayed swallow initiation/reduced closure of the laryngeal vestibule. Penetration occurred with all methods of bolus delivery (teaspoon, cup sip, straw, multiple sips), but was deeper with straw sips and multiple consecutive sips. Penetration was typically transient. Pt intermittently expelled deeper penetration with reflexive throat clear, though she required verbal cues to clear penetrate about 50% of the time. Chin tuck was trialed and observed to worsen, not prevent penetration. With barium pill delivered whole in puree, pt with immediate cough response as tablet entered valleculae, which was not associated with penetration or aspiration. An esophageal sweep was performed revealing stasis of the tablet in the upper esophagus. Subsequent swallow of pureed solid was sufficent to move pill through the distal esophagus. Given the above findings and pt's stable chest x ray (10/31/16), recommend dys 3 diet with thin liquids via single cup sip with immediate throat clear after liquids, medications whole in puree. Pt is noted to be impulsive and given her cognitive deficits, she will require consistent monitoring to  employ precautions. SLP will f/u for tolerance, training in compensatory strategies.  SLP Visit Diagnosis Dysphagia, oropharyngeal phase (R13.12) Attention and concentration deficit following -- Frontal lobe and executive function deficit following -- Impact on safety and function Mild aspiration risk   CHL IP TREATMENT RECOMMENDATION 11/04/2016 Treatment Recommendations Therapy as outlined in treatment plan below   Prognosis 11/04/2016 Prognosis for Safe Diet Advancement Good Barriers to Reach Goals Cognitive deficits Barriers/Prognosis Comment -- CHL IP DIET RECOMMENDATION 11/04/2016 SLP Diet Recommendations Dysphagia 3 (Mech soft) solids;Thin liquid  Liquid Administration via Cup Medication Administration Whole meds with puree Compensations Slow rate;Small sips/bites;Other (Comment) Postural Changes Seated upright at 90 degrees   CHL IP OTHER RECOMMENDATIONS 11/04/2016 Recommended Consults -- Oral Care Recommendations Oral care BID Other Recommendations --   [facility] IP FOLLOW UP RECOMMENDATIONS 11/04/2016 Follow up Recommendations Other (comment)   CHL IP FREQUENCY AND DURATION 11/04/2016 Speech Therapy Frequency (ACUTE ONLY) min 2x/week Treatment Duration 2 weeks      CHL IP ORAL PHASE 11/04/2016 Oral Phase Impaired Oral - Pudding Teaspoon -- Oral - Pudding Cup -- Oral - Honey Teaspoon -- Oral - Honey Cup -- Oral - Nectar Teaspoon WFL Oral - Nectar Cup WFL Oral - Nectar Straw WFL Oral - Thin Teaspoon WFL Oral - Thin Cup WFL Oral - Thin Straw WFL Oral - Puree WFL Oral - Mech Soft -- Oral - Regular Impaired mastication;Decreased bolus cohesion;Delayed oral transit Oral - Multi-Consistency WFL Oral - Pill -- Oral Phase - Comment --  CHL IP PHARYNGEAL PHASE 11/04/2016 Pharyngeal Phase Impaired Pharyngeal- Pudding Teaspoon -- Pharyngeal -- Pharyngeal- Pudding Cup -- Pharyngeal -- Pharyngeal- Honey Teaspoon -- Pharyngeal -- Pharyngeal- Honey Cup -- Pharyngeal -- Pharyngeal- Nectar Teaspoon Delayed swallow initiation-pyriform sinuses;Reduced laryngeal elevation;Penetration/Aspiration before swallow;Reduced airway/laryngeal closure Pharyngeal Material enters airway, remains ABOVE vocal cords and not ejected out;Material enters airway, remains ABOVE vocal cords then ejected out;Material does not enter airway Pharyngeal- Nectar Cup Delayed swallow initiation-pyriform sinuses;Reduced laryngeal elevation;Reduced airway/laryngeal closure;Penetration/Aspiration before swallow Pharyngeal Material enters airway, remains ABOVE vocal cords then ejected out;Material enters airway, remains ABOVE vocal cords and not ejected out Pharyngeal- Nectar Straw Delayed swallow initiation-pyriform  sinuses;Reduced laryngeal elevation;Penetration/Aspiration before swallow Pharyngeal Material enters airway, remains ABOVE vocal cords then ejected out;Material enters airway, remains ABOVE vocal cords and not ejected out;Material enters airway, CONTACTS cords and then ejected out;Material enters airway, CONTACTS cords and not ejected out Pharyngeal- Thin Teaspoon Delayed swallow initiation-pyriform sinuses;Reduced laryngeal elevation;Penetration/Aspiration before swallow;Reduced airway/laryngeal closure Pharyngeal Material does not enter airway;Material enters airway, remains ABOVE vocal cords then ejected out;Material enters airway, remains ABOVE vocal cords and not ejected out Pharyngeal- Thin Cup Delayed swallow initiation-pyriform sinuses;Reduced laryngeal elevation;Reduced airway/laryngeal closure;Penetration/Aspiration before swallow Pharyngeal Material enters airway, remains ABOVE vocal cords then ejected out;Material enters airway, remains ABOVE vocal cords and not ejected out Pharyngeal- Thin Straw Delayed swallow initiation-pyriform sinuses;Reduced laryngeal elevation;Reduced airway/laryngeal closure;Penetration/Aspiration before swallow Pharyngeal Material enters airway, remains ABOVE vocal cords then ejected out;Material enters airway, remains ABOVE vocal cords and not ejected out;Material enters airway, CONTACTS cords and then ejected out;Material enters airway, CONTACTS cords and not ejected out Pharyngeal- Puree Delayed swallow initiation-vallecula;Reduced laryngeal elevation Pharyngeal -- Pharyngeal- Mechanical Soft -- Pharyngeal -- Pharyngeal- Regular Delayed swallow initiation-pyriform sinuses;Reduced laryngeal elevation Pharyngeal -- Pharyngeal- Multi-consistency Delayed swallow initiation-vallecula;Reduced laryngeal elevation Pharyngeal -- Pharyngeal- Pill -- Pharyngeal -- Pharyngeal Comment --  CHL IP CERVICAL ESOPHAGEAL PHASE 11/04/2016 Cervical Esophageal Phase WFL Pudding Teaspoon -- Pudding  Cup -- Honey Teaspoon -- Honey Cup --  Nectar Teaspoon -- Nectar Cup -- Nectar Straw -- Thin Teaspoon -- Thin Cup -- Thin Straw -- Puree -- Mechanical Soft -- Regular -- Multi-consistency -- Pill -- Cervical Esophageal Comment -- No flowsheet data found. 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 Does the need for close, 24 hr/day medical supervision in concert with the patient's rehab needs make it unreasonable for this patient to be served in a less intensive setting? Yes 2. Co-Morbidities requiring supervision/potential complications: Prior pontine bleed, diabetes, uncontrolled with probable retinopathy, history of substance abuse 3. Due to bladder management, bowel management, safety, skin/wound care, disease management, medication administration, pain management and patient education, does the patient require 24 hr/day rehab nursing? Yes 4. Does the patient require coordinated care of a physician, rehab nurse, PT (1-2 hrs/day, 5 days/week), OT (1-2 hrs/day, 5 days/week) and SLP (.5-1 hrs/day, 5 days/week) to address physical and functional deficits in the context of the above medical diagnosis(es)? Yes Addressing deficits in the following areas: balance, endurance, locomotion, strength, transferring, bowel/bladder control, bathing, dressing, feeding, grooming and toileting 5. Can the patient actively participate in an intensive therapy program of at least 3 hrs of therapy per day at least 5 days per week? Yes 6. The potential for patient to make measurable gains while on inpatient rehab is good 7. Anticipated functional outcomes upon discharge from inpatients are sup PT, sup OT, supSLP 8. Estimated rehab length of stay to reach the above functional goals is: 7-10d 9. Does the patient have adequate social supports to accommodate these discharge  functional goals? Yes 10. Anticipated D/C setting: Home 11. Anticipated post D/C treatments: Orient therapy 12. Overall Rehab/Functional Prognosis: good  RECOMMENDATIONS: This patient's condition is appropriate for continued rehabilitative care in the following setting: CIR Patient has agreed to participate in recommended program. Yes and Potentially Note that insurance prior authorization may be required for reimbursement for recommended care.  Comment:pt expressed wish to go home we discussed my rec for inpt rehab   [name] 11/05/2016

## 2016-11-05 NOTE — Progress Notes (Signed)
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 When her husband's home health nurse came to the house the patient was found altered w/ on the couch w/ evidence of multiple episodes of incontinence. Upon arrival to the ED patient was non-verbal and unable to follow commands. WBC 14.2, Lactic Acid 4.65, Glucose 1200, and pH 7.2.Admitted by PCCM.  Events: 4/24 Admit w/ DKA - CT Head 4/24 >Brain atrophy and chronic white matter microvascular changes - resolution of remote left pontine hemorrhage  4/25 EEG - no evidence of active seizure  4/26 TR[facility] assumed care   Assessment & Plan:   # CNS infection, possible acute bacterial meningitis:  -Status post LP. Total WBC count 24 with 90% neutrophils which is probably because of receiving antibiotics before lumbar puncture. Follow up culture results. Evaluated by both neurology and infectious disease. Recommended to discontinue acyclovir and ampicillin. ID recommended to continue vancomycin and ceftriaxone for total 7 days.  -Patient is more alert awake and mental status improved. She actually wanted to go home today. Education provided to the patient for the need of antibiotics and further care. She verbalized understanding.  # Acute lacunar infarction on the right side: This is likely small vessel disease as opposed to embolic as per neurologist. Patient recently had echo, no repeat echo recommended this time.  -Carotid ultrasound Doppler with no critical stenosis -Recommended Keppra 500 twice a day for short-term. -Continue aspirin, statin -PT, OT evaluation.  #Altered mental status likely due to combination of meningitis, DKA and CVA: Patient is  alert awake and oriented today. Her mental status improved. She understand her clinical condition and plan of care. Continue to monitor.  #Type 2 diabetes on chronic insulin: Admitted with DKA which was resolved. Monitor blood sugar table. Continue current insulin regimen.  # Hypertension: Blood pressure elevated. Discontinue IV metoprolol and IV fluid. Resumed oral metoprolol twice a day, lisinopril and continue Norvasc. Monitor blood pressure closely.  # Acute on CKD stage 3: Serum creatinine level improved. Avoid toxins.  # Hypokalemia and hypokalemia: Electrolytes improved.  Left parotid space 2.1 cm soft tissue nodule Worrisome for neoplasm per Radiologist - will need ENT f/u   DVT prophylaxis: Lovenox subcutaneous Code Status: Full code Family Communication: No family at bedside Disposition Plan: PT OT evaluate the patient. CIR consulted. Likely discharge to rehabilitation versus home with home care in 1-2 days.    Consultants:   Neurology  Infectious disease  Initially admitted by PCCM.  Procedures: None Antimicrobials: Vancomycin 4/24 > Zosyn 4/24 > 4/26 Ceftriaxone 4/26 > Subjective: Patient was seen and examined at bedside. Significant clinical improvement. Alert awake and oriented. Denied headache, dizziness, nausea, vomiting, chest pain or shortness of breath. She wanted to go home. I educated patient that she needs IV antibiotics and plan of care. They have evaluation ongoing. I believe she can make her clinical decision at this time. She verbalized understanding and willing to stay to continue treatment. Objective: Vitals:   11/04/16 2111 11/05/16 0114 11/05/16 0130 11/05/16 0634  BP: (!) 182/98 (!) 194/83 (!) 148/86 (!) 189/95  Pulse: 83 93  99  Resp: 18 18  18   Temp: 98.6 F (37 C)  98.6 F (37 C)    TempSrc: Oral Oral    SpO2: 96% 96%    Weight:      Height:        Intake/Output Summary (Last 24 hours) at 11/05/16 1029 Last data filed at 11/04/16  2217  Gross per 24 hour  Intake              360 ml  Output                0 ml  Net              360 ml   Filed Weights   10/31/16 1702 11/03/16 2116  Weight: 102.1 kg (225 lb) 103.8 kg (228 lb 13.4 oz)    Examination:  General exam: Not in distress, lying on bed comfortable Respiratory system: Clear bilateral. Respiratory effort normal. No wheezing or crackle Cardiovascular system: S1 & S2 heard, RRR.  No pedal edema. Gastrointestinal system: Abdomen is nondistended, soft and nontender. Normal bowel sounds heard. Central nervous system: Alert, awake, oriented. No focal neurological deficit. Extremities: Symmetric 5 x 5 power. Skin: No rashes, lesions or ulcers     Data Reviewed: I have personally reviewed following labs and imaging studies  CBC:  Recent Labs Lab 10/31/16 1513 11/01/16 0215 11/02/16 0320 11/03/16 0342 11/04/16 0610  WBC 14.2* 19.3* 19.6* 13.5* 8.8  NEUTROABS 11.8*  --   --   --   --   HGB 16.0* 15.7* 14.4 14.3 12.9  HCT 52.2* 46.2* 43.5 44.6 39.7  MCV 100.6* 91.1 91.8 93.3 92.1  PLT 270 258 194 175 846   Basic Metabolic Panel:  Recent Labs Lab 11/01/16 0215  11/01/16 1329 11/02/16 0320 11/02/16 1906 11/03/16 0342 11/04/16 0525 11/04/16 0610 11/05/16 0353  NA 161*  < > 156*  157* 157* 158* 154*  --  147* 143  K 2.8*  < > 3.8  3.9 3.4* 3.2* 3.3*  --  2.7* 4.9  CL 128*  < > 129*  129* >130* 128* 128*  --  119* 117*  CO2 23  < > 18*  17* 19* 20* 18*  --  20* 19*  GLUCOSE 68  < > 313*  325* 245* 169* 165*  --  78 97  BUN 35*  < > 25*  25* 22* 21* 17  --  10 6  CREATININE 2.21*  < > 2.14*  2.17* 2.04* 1.97* 1.68*  --  1.37* 1.28*  CALCIUM 9.1  < > 8.4*  8.5* 8.6* 9.0 8.3*  --  8.1* 8.3*  MG 2.3  --   --  2.1  --  2.0 1.6*  --  1.9  PHOS <1.0*  --  1.2* 1.4*  --  2.1*  --   --  3.3  < > = values in this interval not displayed. GFR: Estimated Creatinine Clearance: 59.7 mL/min (A) (by C-G formula based on SCr of 1.28 mg/dL  (H)). Liver Function Tests:  Recent Labs Lab 10/31/16 1513 11/04/16 0610 11/05/16 0353  AST 20 19  --   ALT 16 14  --   ALKPHOS 69 37*  --   BILITOT 1.4* 0.4  --   PROT 9.0* 6.0*  --   ALBUMIN 3.4* 2.1* 2.3*   No results for input(s): LIPASE, AMYLASE in the last 168 hours. No results for input(s): AMMONIA in the last 168 hours. Coagulation Profile:  Recent Labs Lab 10/31/16 1513  INR 1.09   Cardiac Enzymes: No results  for input(s): CKTOTAL, CKMB, CKMBINDEX, TROPONINI in the last 168 hours. BNP (last 3 results) No results for input(s): PROBNP in the last 8760 hours. HbA1C: No results for input(s): HGBA1C in the last 72 hours. CBG:  Recent Labs Lab 11/04/16 1149 11/04/16 1309 11/04/16 1649 11/04/16 2110 11/05/16 0638  GLUCAP 79 125* 212* 124* 105*   Lipid Profile:  Recent Labs  11/04/16 0610  CHOL 134  HDL 26*  LDLCALC 82  TRIG 131  CHOLHDL 5.2   Thyroid Function Tests: No results for input(s): TSH, T4TOTAL, FREET4, T3FREE, THYROIDAB in the last 72 hours. Anemia Panel: No results for input(s): VITAMINB12, FOLATE, FERRITIN, TIBC, IRON, RETICCTPCT in the last 72 hours. Sepsis Labs:  Recent Labs Lab 10/31/16 1524 10/31/16 1956 10/31/16 2228 11/01/16 0215 11/02/16 0320  PROCALCITON  --  <0.10  --  <0.10 0.11  LATICACIDVEN 4.65* 5.3* 6.3*  --   --     Recent Results (from the past 240 hour(s))  Culture, blood (Routine x 2)     Status: Abnormal   Collection Time: 10/31/16  3:13 PM  Result Value Ref Range Status   Specimen Description BLOOD LEFT HAND  Final   Special Requests   Final    BOTTLES DRAWN AEROBIC AND ANAEROBIC Blood Culture adequate volume   Culture  Setup Time   Final    GRAM POSITIVE COCCI IN CLUSTERS IN BOTH AEROBIC AND ANAEROBIC BOTTLES CRITICAL RESULT CALLED TO, READ BACK BY AND VERIFIED WITH: PHARMD M [place] [id number] MLM    Culture (A)  Final    STAPHYLOCOCCUS SPECIES (COAGULASE NEGATIVE) THE SIGNIFICANCE OF ISOLATING THIS  ORGANISM FROM A SINGLE SET OF BLOOD CULTURES WHEN MULTIPLE SETS ARE DRAWN IS UNCERTAIN. PLEASE NOTIFY THE MICROBIOLOGY DEPARTMENT WITHIN ONE WEEK IF SPECIATION AND SENSITIVITIES ARE REQUIRED.    Report Status 11/03/2016 FINAL  Final  Blood Culture ID Panel (Reflexed)     Status: Abnormal   Collection Time: 10/31/16  3:13 PM  Result Value Ref Range Status   Enterococcus species NOT DETECTED NOT DETECTED Final   Listeria monocytogenes NOT DETECTED NOT DETECTED Final   Staphylococcus species DETECTED (A) NOT DETECTED Final    Comment: Methicillin (oxacillin) resistant coagulase negative staphylococcus. Possible blood culture contaminant (unless isolated from more than one blood culture draw or clinical case suggests pathogenicity). No antibiotic treatment is indicated for blood  culture contaminants. CRITICAL RESULT CALLED TO, READ BACK BY AND VERIFIED WITH: PHARMD M [place] [id number] MLM    Staphylococcus aureus NOT DETECTED NOT DETECTED Final   Methicillin resistance DETECTED (A) NOT DETECTED Final    Comment: CRITICAL RESULT CALLED TO, READ BACK BY AND VERIFIED WITH: PHARMD M [place] [phone] MLM    Streptococcus species NOT DETECTED NOT DETECTED Final   Streptococcus agalactiae NOT DETECTED NOT DETECTED Final   Streptococcus pneumoniae NOT DETECTED NOT DETECTED Final   Streptococcus pyogenes NOT DETECTED NOT DETECTED Final   Acinetobacter baumannii NOT DETECTED NOT DETECTED Final   Enterobacteriaceae species NOT DETECTED NOT DETECTED Final   Enterobacter cloacae complex NOT DETECTED NOT DETECTED Final   Escherichia coli NOT DETECTED NOT DETECTED Final   Klebsiella oxytoca NOT DETECTED NOT DETECTED Final   Klebsiella pneumoniae NOT DETECTED NOT DETECTED Final   Proteus species NOT DETECTED NOT DETECTED Final   Serratia marcescens NOT DETECTED NOT DETECTED Final   Haemophilus influenzae NOT DETECTED NOT DETECTED Final   Neisseria meningitidis NOT DETECTED NOT DETECTED Final    Pseudomonas aeruginosa NOT DETECTED NOT  DETECTED Final   Candida albicans NOT DETECTED NOT DETECTED Final   Candida glabrata NOT DETECTED NOT DETECTED Final   Candida krusei NOT DETECTED NOT DETECTED Final   Candida parapsilosis NOT DETECTED NOT DETECTED Final   Candida tropicalis NOT DETECTED NOT DETECTED Final  Culture, blood (Routine x 2)     Status: None (Preliminary result)   Collection Time: 10/31/16  3:27 PM  Result Value Ref Range Status   Specimen Description BLOOD LEFT ANTECUBITAL  Final   Special Requests IN PEDIATRIC BOTTLE Blood Culture adequate volume  Final   Culture NO GROWTH 4 DAYS  Final   Report Status PENDING  Incomplete  Culture, Urine     Status: None   Collection Time: 10/31/16  7:43 PM  Result Value Ref Range Status   Specimen Description URINE, CATHETERIZED  Final   Special Requests NONE  Final   Culture NO GROWTH  Final   Report Status 11/02/2016 FINAL  Final  MRSA PCR Screening     Status: None   Collection Time: 10/31/16  9:24 PM  Result Value Ref Range Status   MRSA by PCR NEGATIVE NEGATIVE Final    Comment:        The GeneXpert MRSA Assay (FDA approved for NASAL specimens only), is one component of a comprehensive MRSA colonization surveillance program. It is not intended to diagnose MRSA infection nor to guide or monitor treatment for MRSA infections.   Respiratory Panel by PCR     Status: None   Collection Time: 11/01/16 10:03 AM  Result Value Ref Range Status   Adenovirus NOT DETECTED NOT DETECTED Final   Coronavirus 229E NOT DETECTED NOT DETECTED Final   Coronavirus HKU1 NOT DETECTED NOT DETECTED Final   Coronavirus NL63 NOT DETECTED NOT DETECTED Final   Coronavirus OC43 NOT DETECTED NOT DETECTED Final   Metapneumovirus NOT DETECTED NOT DETECTED Final   Rhinovirus / Enterovirus NOT DETECTED NOT DETECTED Final   Influenza A NOT DETECTED NOT DETECTED Final   Influenza B NOT DETECTED NOT DETECTED Final   Parainfluenza Virus 1 NOT DETECTED  NOT DETECTED Final   Parainfluenza Virus 2 NOT DETECTED NOT DETECTED Final   Parainfluenza Virus 3 NOT DETECTED NOT DETECTED Final   Parainfluenza Virus 4 NOT DETECTED NOT DETECTED Final   Respiratory Syncytial Virus NOT DETECTED NOT DETECTED Final   Bordetella pertussis NOT DETECTED NOT DETECTED Final   Chlamydophila pneumoniae NOT DETECTED NOT DETECTED Final   Mycoplasma pneumoniae NOT DETECTED NOT DETECTED Final  CSF culture     Status: None (Preliminary result)   Collection Time: 11/02/16  3:19 PM  Result Value Ref Range Status   Specimen Description CSF  Final   Special Requests ADDED [id number] [phone]  Final   Gram Stain   Final    WBC PRESENT, PREDOMINANTLY PMN NO ORGANISMS SEEN CYTOSPIN SMEAR    Culture NO GROWTH 2 DAYS  Final   Report Status PENDING  Incomplete  Culture, blood (Routine X 2) w Reflex to ID Panel     Status: None (Preliminary result)   Collection Time: 11/02/16  7:04 PM  Result Value Ref Range Status   Specimen Description BLOOD RIGHT HAND  Final   Special Requests IN PEDIATRIC BOTTLE Blood Culture adequate volume  Final   Culture NO GROWTH 2 DAYS  Final   Report Status PENDING  Incomplete  Culture, blood (Routine X 2) w Reflex to ID Panel     Status: None (Preliminary result)   Collection Time:  11/02/16  7:04 PM  Result Value Ref Range Status   Specimen Description BLOOD LEFT HAND  Final   Special Requests   Final    BOTTLES DRAWN AEROBIC AND ANAEROBIC Blood Culture adequate volume   Culture NO GROWTH 2 DAYS  Final   Report Status PENDING  Incomplete         Radiology Studies: Dg Swallowing Func-speech Pathology  Result Date: 11/04/2016 Objective Swallowing Evaluation: Type of Study: MBS-Modified Barium Swallow Study Patient Details Name: Mily Malecki MRN: [id number] Date of Birth: 02/24/1956 Today's Date: 11/04/2016 Time: SLP Start Time (ACUTE ONLY): 1200-SLP Stop Time (ACUTE ONLY): 1220 SLP Time Calculation (min) (ACUTE ONLY): 20 min Past Medical  History: Past Medical History: Diagnosis Date . Diabetes mellitus   Hb A1C = 12.6 on 05/15/11, managed on Novolog 70/30, 35 U qam, 25 U qpm . Hypertension   poorly controlled . Shortness of breath  Past Surgical History: Past Surgical History: Procedure Laterality Date . ANKLE FRACTURE SURGERY  3661 HPI: 61 year old female with PMH of DM, HTN, chronic back pain, tobacco/cocaine abuse, CKD III, and recent ICH (right residual hemiparesis). Presents to ED on 4/24 after being found at home with altered mental status. Per report patient typically takes care of husband and today when home health nurse came to the house, patient was found on the couch with multiple episodes of incontinence. Found ot have DKA, small acute lacunar infarct and right globus pallidus. CXR 4/24 >Slight bibasilar subsegmental atelectasisand mild vascular congestionPt has a history of pontine hemorrhage in 12/17 and had MBS at that time showing delayed swallow with aspiration of thin and trace aspiration of nectar. Pt recommended to consume nectar thick liquids with therapy to train a breath hold with swallowing.  Subjective: Alert, pleasant Assessment / Plan / Recommendation CHL IP CLINICAL IMPRESSIONS 11/04/2016 Clinical Impression Patient presents with mild-moderate oropharyngeal dysphagia which appears consistent with swallowing function observed during previous instrumental examinations. Oral stage remarkable for prolonged mastication, delayed oral transit and decreased bolus cohesion with solid. Pharyngeal stage of swallowing characterized by adequate tongue base retraction, adequate anterior laryngeal movement, decreased laryngeal elevation, and adequate pharyngeal constriction. Swallow initiation delayed to the level of the pyriform sinuses with all consistencies, with the exception of barium pill, puree (valleculae trigger). Pharyngeal space notable for appearance of bony protrusions ~C4-5 which may have contributed to sensory deficits.  Consistent penetration of thin and nectar thick liquids occurred during the swallow due to delayed swallow initiation/reduced closure of the laryngeal vestibule. Penetration occurred with all methods of bolus delivery (teaspoon, cup sip, straw, multiple sips), but was deeper with straw sips and multiple consecutive sips. Penetration was typically transient. Pt intermittently expelled deeper penetration with reflexive throat clear, though she required verbal cues to clear penetrate about 50% of the time. Chin tuck was trialed and observed to worsen, not prevent penetration. With barium pill delivered whole in puree, pt with immediate cough response as tablet entered valleculae, which was not associated with penetration or aspiration. An esophageal sweep was performed revealing stasis of the tablet in the upper esophagus. Subsequent swallow of pureed solid was sufficent to move pill through the distal esophagus. Given the above findings and pt's stable chest x ray (10/31/16), recommend dys 3 diet with thin liquids via single cup sip with immediate throat clear after liquids, medications whole in puree. Pt is noted to be impulsive and given her cognitive deficits, she will require consistent monitoring to employ precautions. SLP will f/u for tolerance,  training in compensatory strategies.  SLP Visit Diagnosis Dysphagia, oropharyngeal phase (R13.12) Attention and concentration deficit following -- Frontal lobe and executive function deficit following -- Impact on safety and function Mild aspiration risk   CHL IP TREATMENT RECOMMENDATION 11/04/2016 Treatment Recommendations Therapy as outlined in treatment plan below   Prognosis 11/04/2016 Prognosis for Safe Diet Advancement Good Barriers to Reach Goals Cognitive deficits Barriers/Prognosis Comment -- CHL IP DIET RECOMMENDATION 11/04/2016 SLP Diet Recommendations Dysphagia 3 (Mech soft) solids;Thin liquid Liquid Administration via Cup Medication Administration Whole meds  with puree Compensations Slow rate;Small sips/bites;Other (Comment) Postural Changes Seated upright at 90 degrees   CHL IP OTHER RECOMMENDATIONS 11/04/2016 Recommended Consults -- Oral Care Recommendations Oral care BID Other Recommendations --   CHL IP FOLLOW UP RECOMMENDATIONS 11/04/2016 Follow up Recommendations Other (comment)   CHL IP FREQUENCY AND DURATION 11/04/2016 Speech Therapy Frequency (ACUTE ONLY) min 2x/week Treatment Duration 2 weeks      CHL IP ORAL PHASE 11/04/2016 Oral Phase Impaired Oral - Pudding Teaspoon -- Oral - Pudding Cup -- Oral - Honey Teaspoon -- Oral - Honey Cup -- Oral - Nectar Teaspoon WFL Oral - Nectar Cup WFL Oral - Nectar Straw WFL Oral - Thin Teaspoon WFL Oral - Thin Cup WFL Oral - Thin Straw WFL Oral - Puree WFL Oral - Mech Soft -- Oral - Regular Impaired mastication;Decreased bolus cohesion;Delayed oral transit Oral - Multi-Consistency WFL Oral - Pill -- Oral Phase - Comment --  CHL IP PHARYNGEAL PHASE 11/04/2016 Pharyngeal Phase Impaired Pharyngeal- Pudding Teaspoon -- Pharyngeal -- Pharyngeal- Pudding Cup -- Pharyngeal -- Pharyngeal- Honey Teaspoon -- Pharyngeal -- Pharyngeal- Honey Cup -- Pharyngeal -- Pharyngeal- Nectar Teaspoon Delayed swallow initiation-pyriform sinuses;Reduced laryngeal elevation;Penetration/Aspiration before swallow;Reduced airway/laryngeal closure Pharyngeal Material enters airway, remains ABOVE vocal cords and not ejected out;Material enters airway, remains ABOVE vocal cords then ejected out;Material does not enter airway Pharyngeal- Nectar Cup Delayed swallow initiation-pyriform sinuses;Reduced laryngeal elevation;Reduced airway/laryngeal closure;Penetration/Aspiration before swallow Pharyngeal Material enters airway, remains ABOVE vocal cords then ejected out;Material enters airway, remains ABOVE vocal cords and not ejected out Pharyngeal- Nectar Straw Delayed swallow initiation-pyriform sinuses;Reduced laryngeal elevation;Penetration/Aspiration before  swallow Pharyngeal Material enters airway, remains ABOVE vocal cords then ejected out;Material enters airway, remains ABOVE vocal cords and not ejected out;Material enters airway, CONTACTS cords and then ejected out;Material enters airway, CONTACTS cords and not ejected out Pharyngeal- Thin Teaspoon Delayed swallow initiation-pyriform sinuses;Reduced laryngeal elevation;Penetration/Aspiration before swallow;Reduced airway/laryngeal closure Pharyngeal Material does not enter airway;Material enters airway, remains ABOVE vocal cords then ejected out;Material enters airway, remains ABOVE vocal cords and not ejected out Pharyngeal- Thin Cup Delayed swallow initiation-pyriform sinuses;Reduced laryngeal elevation;Reduced airway/laryngeal closure;Penetration/Aspiration before swallow Pharyngeal Material enters airway, remains ABOVE vocal cords then ejected out;Material enters airway, remains ABOVE vocal cords and not ejected out Pharyngeal- Thin Straw Delayed swallow initiation-pyriform sinuses;Reduced laryngeal elevation;Reduced airway/laryngeal closure;Penetration/Aspiration before swallow Pharyngeal Material enters airway, remains ABOVE vocal cords then ejected out;Material enters airway, remains ABOVE vocal cords and not ejected out;Material enters airway, CONTACTS cords and then ejected out;Material enters airway, CONTACTS cords and not ejected out Pharyngeal- Puree Delayed swallow initiation-vallecula;Reduced laryngeal elevation Pharyngeal -- Pharyngeal- Mechanical Soft -- Pharyngeal -- Pharyngeal- Regular Delayed swallow initiation-pyriform sinuses;Reduced laryngeal elevation Pharyngeal -- Pharyngeal- Multi-consistency Delayed swallow initiation-vallecula;Reduced laryngeal elevation Pharyngeal -- Pharyngeal- Pill -- Pharyngeal -- Pharyngeal Comment --  CHL IP CERVICAL ESOPHAGEAL PHASE 11/04/2016 Cervical Esophageal Phase WFL Pudding Teaspoon -- Pudding Cup -- Honey Teaspoon -- Honey Cup -- Nectar Teaspoon -- Nectar  Cup -- [employer]  Straw -- Thin Teaspoon -- Thin Cup -- Thin Straw -- Puree -- Mechanical Soft -- Regular -- Multi-consistency -- Pill -- Cervical Esophageal Comment -- No flowsheet data found. Deneise Lever, [place] CF-SLP Speech-Language Pathologist [phone] Aliene Altes 11/04/2016, 2:19 PM                   Scheduled Meds: . amLODipine  10 mg Oral Daily  . aspirin  81 mg Oral Daily  . atorvastatin  40 mg Oral q1800  . folic acid  1 mg Oral Daily  . heparin  5,000 Units Subcutaneous Q8H  . insulin aspart  0-20 Units Subcutaneous TID WC  . insulin aspart  0-5 Units Subcutaneous QHS  . insulin glargine  30 Units Subcutaneous BID  . lisinopril  20 mg Oral Daily  . metoprolol tartrate  25 mg Oral BID  . multivitamin with minerals  1 tablet Oral Daily  . nitroGLYCERIN  1 inch Topical Q6H  . pantoprazole  40 mg Oral Q1200  . potassium chloride  40 mEq Oral BID   Continuous Infusions: . cefTRIAXone (ROCEPHIN)  IV Stopped (11/04/16 2217)  . levETIRAcetam Stopped (11/04/16 2202)  . vancomycin Stopped (11/04/16 2000)     LOS: 5 days    Ashish Rossetti Tanna Furry, MD [facility] Pager [phone]  If 7PM-7AM, please contact night-coverage [email or web address] Password [facility] 11/05/2016, 10:29 AM

## 2016-11-05 NOTE — Progress Notes (Signed)
Pharmacy Antibiotic Note  [name] is a 61 y.o. female admitted on 10/31/2016 with possible meningitis.   Continue on Ceftriaxone and Vancomycin Day # 6 Scr stable   Plan: Continue Vancomycin 1250 mg iv Q 24 hours Continue Ceftriaxone 2 grams iv Q 12 hours Planning 7 days of treatment  Height: 5\' 8"  (172.7 cm) Weight: 228 lb 13.4 oz (103.8 kg) IBW/kg (Calculated) : 63.9  Temp (24hrs), Avg:98.5 F (36.9 C), Min:98.3 F (36.8 C), Max:98.6 F (37 C)   Recent Labs Lab 10/31/16 1513 10/31/16 1524 10/31/16 1956 10/31/16 2228 11/01/16 0215  11/02/16 0320 11/02/16 1906 11/03/16 0342 11/04/16 0610 11/05/16 0353  WBC 14.2*  --   --   --  19.3*  --  19.6*  --  13.5* 8.8  --   CREATININE 3.77*  --  3.12* 2.61* 2.21*  < > 2.04* 1.97* 1.68* 1.37* 1.28*  LATICACIDVEN  --  4.65* 5.3* 6.3*  --   --   --   --   --   --   --   < > = values in this interval not displayed.  Estimated Creatinine Clearance: 59.7 mL/min (A) (by C-G formula based on SCr of 1.28 mg/dL (H)).    No Known Allergies  Antimicrobials this admission: 4/24 Vanc>> 4/24 Zosyn>>4/26 4/24 Ceftriaxone x1; 4/27 >> 4/26 Acyclovir >> 4/28 4/27 Ampicillin >>4/28  Dose adjustments this admission: 4/25 SCr improving >> incr Vanc 1g q48 to q24h.  4/27 SCr further improved >> incr Vanc to 1250 q24h  Microbiology results: 4/24BCx: 1/2 GPC clusters (BCID MRSE) 4/24 UCx: negative 4/24 MRSA pcr negative 4/25 Resp viral panel: negative 4/26 CSF:  NGTD 4/26 BCx:  NGTD  Thank you Anette Guarneri, PharmD [phone]  11/05/2016 11:44 AM

## 2016-11-06 ENCOUNTER — Encounter (HOSPITAL_COMMUNITY): Payer: Self-pay | Admitting: Emergency Medicine

## 2016-11-06 ENCOUNTER — Inpatient Hospital Stay (HOSPITAL_COMMUNITY)
Admission: RE | Admit: 2016-11-06 | Discharge: 2016-11-12 | DRG: 091 | Disposition: A | Discharge status: Home / Self Care | Payer: Medicaid Other | Source: Intra-hospital | Attending: Physical Medicine & Rehabilitation | Admitting: Physical Medicine & Rehabilitation

## 2016-11-06 ENCOUNTER — Encounter (HOSPITAL_COMMUNITY): Payer: Self-pay | Admitting: Physical Medicine and Rehabilitation

## 2016-11-06 DIAGNOSIS — G4700 Insomnia, unspecified: Secondary | ICD-10-CM

## 2016-11-06 DIAGNOSIS — F5101 Primary insomnia: Secondary | ICD-10-CM

## 2016-11-06 DIAGNOSIS — B182 Chronic viral hepatitis C: Secondary | ICD-10-CM | POA: Diagnosis not present

## 2016-11-06 DIAGNOSIS — M792 Neuralgia and neuritis, unspecified: Secondary | ICD-10-CM

## 2016-11-06 DIAGNOSIS — Z823 Family history of stroke: Secondary | ICD-10-CM

## 2016-11-06 DIAGNOSIS — R32 Unspecified urinary incontinence: Secondary | ICD-10-CM

## 2016-11-06 DIAGNOSIS — E1122 Type 2 diabetes mellitus with diabetic chronic kidney disease: Secondary | ICD-10-CM | POA: Diagnosis not present

## 2016-11-06 DIAGNOSIS — F1410 Cocaine abuse, uncomplicated: Secondary | ICD-10-CM

## 2016-11-06 DIAGNOSIS — E1165 Type 2 diabetes mellitus with hyperglycemia: Secondary | ICD-10-CM | POA: Diagnosis not present

## 2016-11-06 DIAGNOSIS — I639 Cerebral infarction, unspecified: Secondary | ICD-10-CM

## 2016-11-06 DIAGNOSIS — E1011 Type 1 diabetes mellitus with ketoacidosis with coma: Secondary | ICD-10-CM

## 2016-11-06 DIAGNOSIS — E11649 Type 2 diabetes mellitus with hypoglycemia without coma: Secondary | ICD-10-CM

## 2016-11-06 DIAGNOSIS — I10 Essential (primary) hypertension: Secondary | ICD-10-CM

## 2016-11-06 DIAGNOSIS — F1910 Other psychoactive substance abuse, uncomplicated: Secondary | ICD-10-CM

## 2016-11-06 DIAGNOSIS — Z79899 Other long term (current) drug therapy: Secondary | ICD-10-CM

## 2016-11-06 DIAGNOSIS — R197 Diarrhea, unspecified: Secondary | ICD-10-CM | POA: Diagnosis not present

## 2016-11-06 DIAGNOSIS — E876 Hypokalemia: Secondary | ICD-10-CM | POA: Diagnosis not present

## 2016-11-06 DIAGNOSIS — Z794 Long term (current) use of insulin: Secondary | ICD-10-CM | POA: Diagnosis not present

## 2016-11-06 DIAGNOSIS — Z9114 Patient's other noncompliance with medication regimen: Secondary | ICD-10-CM | POA: Diagnosis not present

## 2016-11-06 DIAGNOSIS — F17210 Nicotine dependence, cigarettes, uncomplicated: Secondary | ICD-10-CM | POA: Diagnosis not present

## 2016-11-06 DIAGNOSIS — R7309 Other abnormal glucose: Secondary | ICD-10-CM | POA: Diagnosis not present

## 2016-11-06 DIAGNOSIS — N179 Acute kidney failure, unspecified: Secondary | ICD-10-CM | POA: Diagnosis not present

## 2016-11-06 DIAGNOSIS — R159 Full incontinence of feces: Secondary | ICD-10-CM | POA: Diagnosis not present

## 2016-11-06 DIAGNOSIS — R4189 Other symptoms and signs involving cognitive functions and awareness: Secondary | ICD-10-CM | POA: Diagnosis not present

## 2016-11-06 DIAGNOSIS — G039 Meningitis, unspecified: Secondary | ICD-10-CM | POA: Diagnosis present

## 2016-11-06 DIAGNOSIS — E1142 Type 2 diabetes mellitus with diabetic polyneuropathy: Secondary | ICD-10-CM

## 2016-11-06 DIAGNOSIS — R269 Unspecified abnormalities of gait and mobility: Secondary | ICD-10-CM

## 2016-11-06 DIAGNOSIS — R5381 Other malaise: Secondary | ICD-10-CM

## 2016-11-06 DIAGNOSIS — I129 Hypertensive chronic kidney disease with stage 1 through stage 4 chronic kidney disease, or unspecified chronic kidney disease: Secondary | ICD-10-CM

## 2016-11-06 DIAGNOSIS — B1920 Unspecified viral hepatitis C without hepatic coma: Secondary | ICD-10-CM | POA: Diagnosis not present

## 2016-11-06 DIAGNOSIS — N183 Chronic kidney disease, stage 3 unspecified: Secondary | ICD-10-CM | POA: Diagnosis present

## 2016-11-06 DIAGNOSIS — G0490 Encephalitis and encephalomyelitis, unspecified: Secondary | ICD-10-CM

## 2016-11-06 DIAGNOSIS — Z8673 Personal history of transient ischemic attack (TIA), and cerebral infarction without residual deficits: Secondary | ICD-10-CM

## 2016-11-06 DIAGNOSIS — E1149 Type 2 diabetes mellitus with other diabetic neurological complication: Secondary | ICD-10-CM

## 2016-11-06 DIAGNOSIS — Z8679 Personal history of other diseases of the circulatory system: Secondary | ICD-10-CM

## 2016-11-06 LAB — RENAL FUNCTION PANEL
ALBUMIN: 2.1 g/dL — AB (ref 3.5–5.0)
Anion gap: 6 (ref 5–15)
BUN: 6 mg/dL (ref 6–20)
CALCIUM: 8.3 mg/dL — AB (ref 8.9–10.3)
CO2: 21 mmol/L — AB (ref 22–32)
CREATININE: 1.19 mg/dL — AB (ref 0.44–1.00)
Chloride: 116 mmol/L — ABNORMAL HIGH (ref 101–111)
GFR calc Af Amer: 57 mL/min — ABNORMAL LOW (ref >60)
GFR, EST NON AFRICAN AMERICAN: 49 mL/min — AB (ref >60)
Glucose, Bld: 121 mg/dL — ABNORMAL HIGH (ref 65–99)
PHOSPHORUS: 3.9 mg/dL (ref 2.5–4.6)
Potassium: 3.2 mmol/L — ABNORMAL LOW (ref 3.5–5.1)
SODIUM: 143 mmol/L (ref 135–145)

## 2016-11-06 LAB — GLUCOSE, CAPILLARY
GLUCOSE-CAPILLARY: 272 mg/dL — AB (ref 65–99)
GLUCOSE-CAPILLARY: 129 mg/dL — AB (ref 65–99)
GLUCOSE-CAPILLARY: 322 mg/dL — AB (ref 65–99)
Glucose-Capillary: 213 mg/dL — ABNORMAL HIGH (ref 65–99)
Glucose-Capillary: 168 mg/dL — ABNORMAL HIGH (ref 65–99)

## 2016-11-06 LAB — HCV RNA QUANT

## 2016-11-06 LAB — CSF CULTURE W GRAM STAIN: Culture: NO GROWTH

## 2016-11-06 LAB — CSF CULTURE

## 2016-11-06 MED ORDER — INSULIN ASPART 100 UNIT/ML ~~LOC~~ SOLN
0.0000 [IU] | Freq: Every day | SUBCUTANEOUS | Status: DC
  Administered 2016-11-07: 2 [IU] via SUBCUTANEOUS

## 2016-11-06 MED ORDER — ATORVASTATIN CALCIUM 40 MG PO TABS: 40.0000 mg | ORAL_TABLET | Freq: Every day | ORAL | Status: DC

## 2016-11-06 MED ORDER — ADULT MULTIVITAMIN W/MINERALS CH
1.0000 | ORAL_TABLET | Freq: Every day | ORAL | Status: DC
  Administered 2016-11-07 – 2016-11-12 (×6): 1 via ORAL
  Filled 2016-11-06 (×6): qty 1

## 2016-11-06 MED ORDER — ACETAMINOPHEN 500 MG PO TABS
1000.0000 mg | ORAL_TABLET | Freq: Every day | ORAL | Status: DC
  Administered 2016-11-06 – 2016-11-11 (×6): 1000 mg via ORAL
  Filled 2016-11-06 (×6): qty 2

## 2016-11-06 MED ORDER — BISACODYL 10 MG RE SUPP: 10.0000 mg | Freq: Every day | RECTAL | Status: DC | PRN

## 2016-11-06 MED ORDER — PROCHLORPERAZINE MALEATE 5 MG PO TABS: 5.0000 mg | ORAL_TABLET | Freq: Four times a day (QID) | ORAL | Status: DC | PRN

## 2016-11-06 MED ORDER — ATORVASTATIN CALCIUM 40 MG PO TABS
40.0000 mg | ORAL_TABLET | Freq: Every day | ORAL | Status: DC
  Administered 2016-11-06 – 2016-11-11 (×6): 40 mg via ORAL
  Filled 2016-11-06 (×6): qty 1

## 2016-11-06 MED ORDER — INSULIN GLARGINE 100 UNIT/ML ~~LOC~~ SOLN
30.0000 [IU] | Freq: Two times a day (BID) | SUBCUTANEOUS | Status: DC
  Administered 2016-11-06 – 2016-11-10 (×8): 30 [IU] via SUBCUTANEOUS
  Filled 2016-11-06 (×9): qty 0.3

## 2016-11-06 MED ORDER — POTASSIUM CHLORIDE CRYS ER 20 MEQ PO TBCR
40.0000 meq | EXTENDED_RELEASE_TABLET | Freq: Two times a day (BID) | ORAL | Status: DC
  Administered 2016-11-06: 40 meq via ORAL
  Filled 2016-11-06: qty 2

## 2016-11-06 MED ORDER — TRAZODONE HCL 50 MG PO TABS
25.0000 mg | ORAL_TABLET | Freq: Every evening | ORAL | Status: DC | PRN
  Administered 2016-11-06 – 2016-11-11 (×6): 50 mg via ORAL
  Filled 2016-11-06 (×7): qty 1

## 2016-11-06 MED ORDER — PROCHLORPERAZINE 25 MG RE SUPP: 12.5000 mg | Freq: Four times a day (QID) | RECTAL | Status: DC | PRN

## 2016-11-06 MED ORDER — ALUM & MAG HYDROXIDE-SIMETH 200-200-20 MG/5ML PO SUSP: 30.0000 mL | ORAL | Status: DC | PRN

## 2016-11-06 MED ORDER — FOLIC ACID 1 MG PO TABS
1.0000 mg | ORAL_TABLET | Freq: Every day | ORAL | Status: DC
  Administered 2016-11-07 – 2016-11-12 (×6): 1 mg via ORAL
  Filled 2016-11-06 (×6): qty 1

## 2016-11-06 MED ORDER — POTASSIUM CHLORIDE CRYS ER 20 MEQ PO TBCR
40.0000 meq | EXTENDED_RELEASE_TABLET | Freq: Two times a day (BID) | ORAL | Status: DC
  Administered 2016-11-06 – 2016-11-07 (×2): 40 meq via ORAL
  Filled 2016-11-06 (×2): qty 2

## 2016-11-06 MED ORDER — FLEET ENEMA 7-19 GM/118ML RE ENEM: 1.0000 | ENEMA | Freq: Once | RECTAL | Status: DC | PRN

## 2016-11-06 MED ORDER — DIPHENHYDRAMINE HCL 25 MG PO CAPS
25.0000 mg | ORAL_CAPSULE | Freq: Every day | ORAL | Status: DC
  Administered 2016-11-06 – 2016-11-11 (×6): 25 mg via ORAL
  Filled 2016-11-06 (×6): qty 1

## 2016-11-06 MED ORDER — DEXTROSE 5 % IV SOLN
2.0000 g | Freq: Two times a day (BID) | INTRAVENOUS | Status: AC
  Administered 2016-11-06: 2 g via INTRAVENOUS
  Filled 2016-11-06: qty 2

## 2016-11-06 MED ORDER — INSULIN ASPART 100 UNIT/ML ~~LOC~~ SOLN
0.0000 [IU] | Freq: Three times a day (TID) | SUBCUTANEOUS | Status: DC
  Administered 2016-11-06: 15 [IU] via SUBCUTANEOUS
  Administered 2016-11-07: 4 [IU] via SUBCUTANEOUS
  Administered 2016-11-07: 7 [IU] via SUBCUTANEOUS
  Administered 2016-11-07 – 2016-11-08 (×3): 15 [IU] via SUBCUTANEOUS
  Administered 2016-11-09: 11 [IU] via SUBCUTANEOUS
  Administered 2016-11-09: 4 [IU] via SUBCUTANEOUS
  Administered 2016-11-09: 7 [IU] via SUBCUTANEOUS
  Administered 2016-11-10 (×2): 15 [IU] via SUBCUTANEOUS
  Administered 2016-11-11 – 2016-11-12 (×2): 4 [IU] via SUBCUTANEOUS
  Administered 2016-11-12: 3 [IU] via SUBCUTANEOUS

## 2016-11-06 MED ORDER — PROCHLORPERAZINE EDISYLATE 5 MG/ML IJ SOLN: 5.0000 mg | Freq: Four times a day (QID) | INTRAMUSCULAR | Status: DC | PRN

## 2016-11-06 MED ORDER — VANCOMYCIN HCL 10 G IV SOLR
1250.0000 mg | INTRAVENOUS | Status: AC
  Administered 2016-11-06: 1250 mg via INTRAVENOUS
  Filled 2016-11-06: qty 1250

## 2016-11-06 MED ORDER — ASPIRIN 81 MG PO CHEW: 81.0000 mg | CHEWABLE_TABLET | Freq: Every day | ORAL | Status: DC

## 2016-11-06 MED ORDER — POLYETHYLENE GLYCOL 3350 17 G PO PACK: 17.0000 g | PACK | Freq: Every day | ORAL | Status: DC | PRN

## 2016-11-06 MED ORDER — POTASSIUM CHLORIDE CRYS ER 20 MEQ PO TBCR: 20.0000 meq | EXTENDED_RELEASE_TABLET | Freq: Every day | ORAL | Status: DC

## 2016-11-06 MED ORDER — ACETAMINOPHEN 325 MG PO TABS
650.0000 mg | ORAL_TABLET | Freq: Four times a day (QID) | ORAL | Status: DC | PRN
Start: 2016-11-06 — End: 2016-11-12
  Administered 2016-11-06 – 2016-11-10 (×4): 650 mg via ORAL
  Filled 2016-11-06 (×4): qty 2

## 2016-11-06 MED ORDER — LEVETIRACETAM 1000 MG PO TABS: 500.0000 mg | ORAL_TABLET | Freq: Two times a day (BID) | ORAL | 0 refills | Status: DC

## 2016-11-06 MED ORDER — LEVETIRACETAM 500 MG PO TABS
1000.0000 mg | ORAL_TABLET | Freq: Two times a day (BID) | ORAL | Status: DC
  Administered 2016-11-06 – 2016-11-08 (×4): 1000 mg via ORAL
  Filled 2016-11-06 (×4): qty 2

## 2016-11-06 MED ORDER — ACETAMINOPHEN 325 MG PO TABS: 325.0000 mg | ORAL_TABLET | ORAL | Status: DC | PRN

## 2016-11-06 MED ORDER — AMLODIPINE BESYLATE 10 MG PO TABS
10.0000 mg | ORAL_TABLET | Freq: Every day | ORAL | Status: DC
  Administered 2016-11-07 – 2016-11-12 (×6): 10 mg via ORAL
  Filled 2016-11-06 (×6): qty 1

## 2016-11-06 MED ORDER — INSULIN ASPART PROT & ASPART (70-30 MIX) 100 UNIT/ML ~~LOC~~ SUSP: 33.0000 [IU] | Freq: Two times a day (BID) | SUBCUTANEOUS | 11 refills | Status: DC

## 2016-11-06 MED ORDER — GUAIFENESIN-DM 100-10 MG/5ML PO SYRP: 5.0000 mL | ORAL_SOLUTION | Freq: Four times a day (QID) | ORAL | Status: DC | PRN

## 2016-11-06 MED ORDER — GABAPENTIN 300 MG PO CAPS: 300.0000 mg | ORAL_CAPSULE | Freq: Every day | ORAL | 0 refills | Status: DC

## 2016-11-06 MED ORDER — LIDOCAINE HCL 2 % EX GEL: CUTANEOUS | Status: DC | PRN

## 2016-11-06 MED ORDER — DIPHENHYDRAMINE HCL 12.5 MG/5ML PO ELIX: 12.5000 mg | ORAL_SOLUTION | Freq: Four times a day (QID) | ORAL | Status: DC | PRN

## 2016-11-06 MED ORDER — ENOXAPARIN SODIUM 40 MG/0.4ML ~~LOC~~ SOLN
40.0000 mg | SUBCUTANEOUS | Status: DC
  Administered 2016-11-07 – 2016-11-12 (×6): 40 mg via SUBCUTANEOUS
  Filled 2016-11-06 (×6): qty 0.4

## 2016-11-06 MED ORDER — METOPROLOL TARTRATE 25 MG PO TABS
25.0000 mg | ORAL_TABLET | Freq: Two times a day (BID) | ORAL | Status: DC
  Administered 2016-11-06 – 2016-11-12 (×12): 25 mg via ORAL
  Filled 2016-11-06 (×12): qty 1

## 2016-11-06 MED ORDER — LISINOPRIL 20 MG PO TABS
20.0000 mg | ORAL_TABLET | Freq: Every day | ORAL | Status: DC
  Administered 2016-11-07 – 2016-11-08 (×2): 20 mg via ORAL
  Filled 2016-11-06 (×2): qty 1

## 2016-11-06 MED ORDER — NITROGLYCERIN 2 % TD OINT
1.0000 [in_us] | TOPICAL_OINTMENT | Freq: Four times a day (QID) | TRANSDERMAL | Status: DC
  Administered 2016-11-07: 1 [in_us] via TOPICAL
  Filled 2016-11-06: qty 30

## 2016-11-06 MED ORDER — LEVETIRACETAM 500 MG PO TABS: 500.0000 mg | ORAL_TABLET | Freq: Two times a day (BID) | ORAL | Status: DC

## 2016-11-06 MED ORDER — ASPIRIN 81 MG PO CHEW
81.0000 mg | CHEWABLE_TABLET | Freq: Every day | ORAL | Status: DC
  Administered 2016-11-07 – 2016-11-12 (×6): 81 mg via ORAL
  Filled 2016-11-06 (×6): qty 1

## 2016-11-06 NOTE — H&P (Signed)
Physical Medicine and Rehabilitation Admission H&P    Chief Complaint  Patient presents with  . Weakness, unsteady gait,     HPI: [name] is an 61 y.o. 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 History taken from chart review. Blood sugar 1200 with lactic acidosis. UDS positive for cocaine. She was treated with fluid boluses as well as IV insulin for HONK and started on broad spectrum antibiotics as septic--one +BC for MRSA. CT head reviewed, negative for acute changes. CT abdomen/pelvis negative for infection.  Neurology consulted due to concerns of seizures due to obtundation and left facial and LUE twitching. She was loaded with keppra and EEG done showing non-specific moderate diffuse slowing. She continued to have fevers and LP showed CSF with 22-24 WBC with neurophilic predominance. ID consulted for input and recommended 7 day treatment of meningoencephalitis.  MRI brain showed small acute right lacunar infarct along globus pallidus--felt to be incidental finding and not cause of MS changes per Dr. Leonel Ramsay and to continue ASA for stroke due to small vessel disease. Carotid dopplers without significant stenosis.    Found to be positive for Hep C as well as incidental finding of  Recommended left parotid neoplasm with ENT follow up recommended. MBS done showing mild to moderate oropharyngeal dysphagia and patient started on dysphagia 3, thin liquids--no straws.   Blood pressure control as well as acute on chronic renal failure improving. Lethargy has resolve and patient noted to be deconditioned with unsteady staggering gait, decreased awareness of deficits, impulsivity with poor awareness of deficits. CIR recommended due to impairments in ability to carry out  ADL tasks and deficits in mobility.    Review of Systems  Constitutional: Negative for fever.       Stays cold all the time.   HENT: Negative for congestion, hearing loss and tinnitus.   Eyes: Positive for blurred vision (chronic ). Negative for double vision.  Respiratory: Negative for cough and shortness of breath.   Cardiovascular: Negative for chest pain and palpitations.  Gastrointestinal: Positive for diarrhea. Negative for constipation, heartburn and nausea.  Genitourinary: Negative for dysuria and urgency.       Frequency/urgency with incontinence --new  Musculoskeletal: Negative for back pain, falls and myalgias.  Neurological: Positive for sensory change (burning bilateral feet and hands) and headaches. Negative for dizziness and tingling.  Psychiatric/Behavioral: The patient has insomnia.   All other systems reviewed and are negative.    Past Medical History:  Diagnosis Date  . Cocaine abuse, continuous   . Diabetes mellitus    Hb A1C = 12.6 on 05/15/11, managed on Novolog 70/30, 35 U qam, 25 U qpm  . Hypertension    poorly controlled  . Insomnia disorder   . Shortness of breath     Past Surgical History:  Procedure Laterality Date  . ANKLE FRACTURE SURGERY  2007    Family History  Problem Relation Age of Onset  . Stroke Father   . Diabetes Sister   . Diabetes Brother      Social History:  Single.--has a significant other who has an aide who helps with ADL/home mangement.  She was independent with walker PTA. She reports that she has been smoking 1/2 PPD  She has a 12.50 pack-year smoking history. She has never used smokeless tobacco. She  reports that she drinks about  6 pack/day.  She reports that she uses drugs, including Cocaine--"not much"    Allergies: No Known Allergies    Medications Prior to Admission  Medication Sig Dispense Refill  . amLODipine (NORVASC) 10 MG tablet Take 1 tablet (10 mg total) by mouth daily. 30 tablet 1  . cyclobenzaprine  (FLEXERIL) 10 MG tablet Take 1 tablet (10 mg total) by mouth 2 (two) times daily as needed for muscle spasms. 60 tablet 0  . folic acid (FOLVITE) 1 MG tablet Take 1 tablet (1 mg total) by mouth daily. 30 tablet 0  . guaiFENesin (MUCINEX) 600 MG 12 hr tablet Take 1 tablet (600 mg total) by mouth 2 (two) times daily. 60 tablet 0  . lisinopril-hydrochlorothiazide (PRINZIDE,ZESTORETIC) 20-12.5 MG tablet Take 1 tablet by mouth daily.    . metoprolol tartrate (LOPRESSOR) 25 MG tablet Take 1 tablet (25 mg total) by mouth 2 (two) times daily. 60 tablet 0  . NOREL AD 4-10-325 MG TABS Take 1 tablet by mouth 2 (two) times daily as needed for allergies.  0  . pantoprazole (PROTONIX) 40 MG tablet Take 1 tablet (40 mg total) by mouth daily. 30 tablet 0  . PROAIR HFA 108 (90 Base) MCG/ACT inhaler Inhale 2 puffs into the lungs 4 (four) times daily as needed for wheezing.  0  . simvastatin (ZOCOR) 10 MG tablet Take 1 tablet (10 mg total) by mouth at bedtime. 30 tablet 0  . sitaGLIPtin-metformin (JANUMET) 50-500 MG tablet Take 1 tablet by mouth 2 (two) times daily with a meal.    . traMADol (ULTRAM) 50 MG tablet Take 1 tablet (50 mg total) by mouth daily as needed (for pain). 20 tablet 0  . traZODone (DESYREL) 100 MG tablet Take 1 tablet (100 mg total) by mouth at bedtime. 30 tablet 0  . [DISCONTINUED] gabapentin (NEURONTIN) 300 MG capsule Take 2 capsules (600 mg total) by mouth 4 (four) times daily. 180 capsule 0  . [DISCONTINUED] levETIRAcetam (KEPPRA) 1000 MG tablet Take 1 tablet (1,000 mg total) by mouth every 12 (twelve) hours. 60 tablet 0  . insulin aspart protamine- aspart (NOVOLOG MIX 70/30) (70-30) 100 UNIT/ML injection Inject 0.53 mLs (53 Units total) into the skin daily with breakfast. (Patient not taking: Reported on 11/01/2016) 10 mL 11  . Maltodextrin-Xanthan Gum (RESOURCE THICKENUP CLEAR) POWD Take 360 g by mouth as needed. 3 Can 1  . Multiple Vitamin (MULTIVITAMIN WITH MINERALS) TABS tablet Take 1  tablet by mouth daily.    . nicotine (NICODERM CQ - DOSED IN MG/24 HOURS) 14 mg/24hr patch 14 mg patch daily 2 weeks then 7 mg patch daily 3 weeks and stop 28 patch 0  . [DISCONTINUED] insulin aspart protamine- aspart (NOVOLOG MIX 70/30) (70-30) 100 UNIT/ML injection Inject 0.33 mLs (33 Units total) into the skin daily with supper. (Patient not taking: Reported on 11/01/2016) 10 mL 11    Home: Home Living Family/patient expects to be discharged to:: Private residence Living Arrangements: Spouse/significant other Available Help at Discharge: Friend(s), Family, Available PRN/intermittently Type of Home: House Home Access: Level entry Home Layout: One level Bathroom Shower/Tub: Chiropodist: Standard Home Equipment: None (all DME for significant other)  Lives With: Significant other   Functional History: Prior Function Level of Independence: Independent Comments: Takes care of her significant other providing physical assistance for his ADL.  Functional Status:  Mobility: Bed Mobility Overal bed mobility: Needs Assistance Bed Mobility: Supine to Sit Supine to sit: Min  guard Sit to supine: Min guard General bed mobility comments: Min guard, required cues for safety and technique but no physical assist on this visit.  Transfers Overall transfer level: Needs assistance Equipment used: Rolling walker (2 wheeled) Transfers: Sit to/from Stand Sit to Stand: Min assist General transfer comment: Min assist for boost, practiced from the bed, and from commode pt able to perform with min guard assist. VC for technique and hand placement. Able to stand at [facility] and perform pericare after bowel movement. Pt was incontinent during transfer of urine and stool, but was aware that she needed to go urgently. Demonstrates some instability that improved with RW support and cues for sequencing, with min assist for walker negotiation. Ambulation/Gait Ambulation/Gait assistance: Min  assist Ambulation Distance (Feet): 15 Feet Assistive device: Rolling walker (2 wheeled) Gait Pattern/deviations: Step-through pattern, Decreased stride length, Staggering left, Staggering right, Wide base of support General Gait Details: Intermittent min assist for balance, distance limited to bedroom due to incontinence of bowel and stool. Assist with walker navigation and proximity for safety. Gait velocity: decreased Gait velocity interpretation: Below normal speed for age/gender    ADL: ADL Overall ADL's : Needs assistance/impaired Grooming: Minimal assistance, Standing Upper Body Bathing: Sitting, Minimal assistance Lower Body Bathing: Minimal assistance, Sit to/from stand Upper Body Dressing : Moderate assistance, Sitting Upper Body Dressing Details (indicate cue type and reason): Mod assist due to decreased problem solving when attempting to don gown. Lower Body Dressing: Minimal assistance, Sit to/from stand Toilet Transfer: Minimal assistance, Ambulation, RW, BSC Toileting- Clothing Manipulation and Hygiene: Minimal assistance, Sit to/from stand Toileting - Clothing Manipulation Details (indicate cue type and reason): Pt laying in stool on arrival without awareness. Functional mobility during ADLs: Minimal assistance, Rolling walker General ADL Comments: Problem solving and cognitive deficits primarily affecting ADL independence.   Cognition: Cognition Overall Cognitive Status: Impaired/Different from baseline Arousal/Alertness: Awake/alert Orientation Level: Oriented to person, Oriented to place, Oriented to time Attention: Focused, Sustained Focused Attention: Impaired Focused Attention Impairment: Verbal basic, Functional basic Sustained Attention: Impaired Sustained Attention Impairment: Verbal basic, Functional basic Memory: Impaired Memory Impairment: Storage deficit, Decreased recall of new information Awareness: Impaired Awareness Impairment: Intellectual  impairment Problem Solving: Impaired Problem Solving Impairment: Verbal basic, Functional basic Executive Function: Reasoning, Sequencing, Organizing, Self Correcting, Initiating Reasoning: Impaired Reasoning Impairment: Verbal basic, Functional basic Sequencing: Impaired Sequencing Impairment: Verbal basic, Functional basic Organizing: Impaired Organizing Impairment: Verbal basic, Functional basic Initiating: Impaired Initiating Impairment: Verbal basic, Functional basic Self Correcting: Impaired Self Correcting Impairment: Verbal basic, Functional basic Safety/Judgment: Impaired Comments: poor adherance to swallowing precautions despite cues Cognition Arousal/Alertness: Lethargic Behavior During Therapy: Impulsive Overall Cognitive Status: Impaired/Different from baseline Area of Impairment: Attention, Memory, Following commands, Safety/judgement, Awareness, Problem solving Current Attention Level: Sustained Memory: Decreased short-term memory Following Commands: Follows one step commands consistently Safety/Judgement: Decreased awareness of safety, Decreased awareness of deficits Awareness: Intellectual Problem Solving: Slow processing, Decreased initiation, Difficulty sequencing, Requires verbal cues, Requires tactile cues General Comments: Responds to simple commands i.e. walk to the sink, wash your hands, etc.  Physical Exam: Blood pressure (!) 146/80, pulse 81, temperature 98.2 F (36.8 C), temperature source Oral, resp. rate 20, height '5\' 8"'$  (1.727 m), weight 103.8 kg (228 lb 13.4 oz), SpO2 99 %. Physical Exam  Nursing note and vitals reviewed. Constitutional: She is oriented to person, place, and time. She appears well-developed and well-nourished.  HENT:  Head: Normocephalic and atraumatic.  Eyes: Conjunctivae and EOM are normal. Pupils are equal, round, and  reactive to light.  Neck: Normal range of motion. Neck supple.  Cardiovascular: Normal rate and regular rhythm.    Respiratory: Effort normal and breath sounds normal. No stridor. No respiratory distress. She has no wheezes.  GI: Soft. Bowel sounds are normal. She exhibits no distension. There is no tenderness.  Musculoskeletal: She exhibits no edema or tenderness.  Neurological: She is alert and oriented to person, place, and time.  A&Ox4 with cues Able to follow simple one and two step commands.  Lacks insight and awareness of deficits.  Motor 4+/5, bilateral deltoid, biceps, triceps, grip, hip flexion, knee extensor, ankle dorsiflexor Sensation intact to light touch bilateral upper and lower limbs   Skin: Skin is warm and dry.  Psychiatric: Her affect is blunt. Her speech is delayed. She is slowed.    Results for orders placed or performed during the hospital encounter of 10/31/16 (from the past 48 hour(s))  Glucose, capillary     Status: Abnormal   Collection Time: 11/04/16  1:09 PM  Result Value Ref Range   Glucose-Capillary 125 (H) 65 - 99 mg/dL  Glucose, capillary     Status: Abnormal   Collection Time: 11/04/16  4:49 PM  Result Value Ref Range   Glucose-Capillary 212 (H) 65 - 99 mg/dL  Glucose, capillary     Status: Abnormal   Collection Time: 11/04/16  9:10 PM  Result Value Ref Range   Glucose-Capillary 124 (H) 65 - 99 mg/dL   Comment 1 Notify RN    Comment 2 Document in Chart   Renal function panel     Status: Abnormal   Collection Time: 11/05/16  3:53 AM  Result Value Ref Range   Sodium 143 135 - 145 mmol/L   Potassium 4.9 3.5 - 5.1 mmol/L    Comment: DELTA CHECK NOTED SPECIMEN HEMOLYZED. HEMOLYSIS MAY AFFECT INTEGRITY OF RESULTS.    Chloride 117 (H) 101 - 111 mmol/L   CO2 19 (L) 22 - 32 mmol/L   Glucose, Bld 97 65 - 99 mg/dL   BUN 6 6 - 20 mg/dL   Creatinine, Ser 1.28 (H) 0.44 - 1.00 mg/dL   Calcium 8.3 (L) 8.9 - 10.3 mg/dL   Phosphorus 3.3 2.5 - 4.6 mg/dL   Albumin 2.3 (L) 3.5 - 5.0 g/dL   GFR calc non Af Amer 45 (L) >60 mL/min   GFR calc Af Amer 52 (L) >60 mL/min     Comment: (NOTE) The eGFR has been calculated using the CKD EPI equation. This calculation has not been validated in all clinical situations. eGFR's persistently <60 mL/min signify possible Chronic Kidney Disease.    Anion gap 7 5 - 15  Magnesium     Status: None   Collection Time: 11/05/16  3:53 AM  Result Value Ref Range   Magnesium 1.9 1.7 - 2.4 mg/dL  Glucose, capillary     Status: Abnormal   Collection Time: 11/05/16  6:38 AM  Result Value Ref Range   Glucose-Capillary 105 (H) 65 - 99 mg/dL   Comment 1 Notify RN    Comment 2 Document in Chart   Glucose, capillary     Status: Abnormal   Collection Time: 11/05/16 11:22 AM  Result Value Ref Range   Glucose-Capillary 250 (H) 65 - 99 mg/dL  Glucose, capillary     Status: Abnormal   Collection Time: 11/05/16  3:49 PM  Result Value Ref Range   Glucose-Capillary 272 (H) 65 - 99 mg/dL   Comment 1 Notify RN  Comment 2 Document in Chart   Glucose, capillary     Status: Abnormal   Collection Time: 11/05/16  5:59 PM  Result Value Ref Range   Glucose-Capillary 262 (H) 65 - 99 mg/dL  Glucose, capillary     Status: Abnormal   Collection Time: 11/05/16  9:35 PM  Result Value Ref Range   Glucose-Capillary 211 (H) 65 - 99 mg/dL  Renal function panel     Status: Abnormal   Collection Time: 11/06/16  4:10 AM  Result Value Ref Range   Sodium 143 135 - 145 mmol/L   Potassium 3.2 (L) 3.5 - 5.1 mmol/L    Comment: DELTA CHECK NOTED   Chloride 116 (H) 101 - 111 mmol/L   CO2 21 (L) 22 - 32 mmol/L   Glucose, Bld 121 (H) 65 - 99 mg/dL   BUN 6 6 - 20 mg/dL   Creatinine, Ser 1.19 (H) 0.44 - 1.00 mg/dL   Calcium 8.3 (L) 8.9 - 10.3 mg/dL   Phosphorus 3.9 2.5 - 4.6 mg/dL   Albumin 2.1 (L) 3.5 - 5.0 g/dL   GFR calc non Af Amer 49 (L) >60 mL/min   GFR calc Af Amer 57 (L) >60 mL/min    Comment: (NOTE) The eGFR has been calculated using the CKD EPI equation. This calculation has not been validated in all clinical situations. eGFR's persistently  <60 mL/min signify possible Chronic Kidney Disease.    Anion gap 6 5 - 15  Glucose, capillary     Status: Abnormal   Collection Time: 11/06/16  6:51 AM  Result Value Ref Range   Glucose-Capillary 129 (H) 65 - 99 mg/dL  Glucose, capillary     Status: Abnormal   Collection Time: 11/06/16 11:19 AM  Result Value Ref Range   Glucose-Capillary 213 (H) 65 - 99 mg/dL   No results found.     Medical Problem List and Plan: 1.  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  2.  DVT Prophylaxis/Anticoagulation: Pharmaceutical: Lovenox 3. Pain Management: continue gabapentin for neuropathy.  4. Mood: LCSW to follow for evaluation and support.  5. Neuropsych: This patient is not fully capable of making decisions on her own behalf. 6. Skin/Wound Care: routine pressure relief measures 7. Fluids/Electrolytes/Nutrition: Monitor I/O. Check lytes in am.  8. T2DM with peripheral neuropathy: On lantus bid with SSI. Noncompliant with insulin use--continue to educate on importance of compliance. Monitor BS ac/hs  9. Chronic insomnia: Will try tylenol pm (used Advil pm at home) 10. H/o ICH: On keppra 1000 mg bid. 11. HTN: monitor BP bid. Continue Norvasc, Metoprolol and Lisinopril.  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  13. Diarrhea: will check stool for c diff.  14. Bladder incontinence: Check PVRs. Check UA/UCS in am.  15. Hypokalemia: Continue supplement.  16. Acute on chronic renal failure: Resolving --will recheck labs in am.  17. Polysubstance abuse: Counsel when appropriate. 18. CVA: Cont meds   Post Admission Physician Evaluation: 1. Preadmission assessment reviewed and changes made below. 2. Functional deficits secondary  to Meningo encephalitis. 3. Patient is admitted to receive collaborative, interdisciplinary care between the physiatrist, rehab nursing staff, and therapy team. 4. Patient's level of medical  complexity and substantial therapy needs in context of that medical necessity cannot be provided at a lesser intensity of care such as a SNF. 5. Patient has experienced substantial functional loss from his/her baseline which was documented above under the "Functional History" and "Functional Status" headings.  Judging by the patient's  diagnosis, physical exam, and functional history, the patient has potential for functional progress which will result in measurable gains while on inpatient rehab.  These gains will be of substantial and practical use upon discharge  in facilitating mobility and self-care at the household level. 6. Physiatrist will provide 24 hour management of medical needs as well as oversight of the therapy plan/treatment and provide guidance as appropriate regarding the interaction of the two. 7. The Preadmission Screening has been reviewed and patient status is unchanged unless otherwise stated above. 8. 24 hour rehab nursing will assist with bladder management, bowel management, safety, disease management, medication administration and patient education  and help integrate therapy concepts, techniques,education, etc. 9. PT will assess and treat for/with: Lower extremity strength, range of motion, stamina, balance, functional mobility, safety, adaptive techniques and equipment,  coping skills, pain control, education.   Goals are: Supervision. 10. OT will assess and treat for/with: ADL's, functional mobility, safety, upper extremity strength, adaptive techniques and equipment, ego support, and community reintegration.   Goals are: Supervision. Therapy may proceed with showering this patient. 11. SLP will assess and treat for/with: cognition.  Goals are: Supervision. 12. Case Management and Social Worker will assess and treat for psychological issues and discharge planning. 33. Team conference will be held weekly to assess progress toward goals and to determine barriers to  discharge. 14. Patient will receive at least 3 hours of therapy per day at least 5 days per week. 15. ELOS: 7-10 days.       16. Prognosis:  excellent  Delice Lesch, MD, [address], [place] 11/06/2016

## 2016-11-06 NOTE — Progress Notes (Signed)
Retta Diones, RN Rehab Admission Coordinator Signed Physical Medicine and Rehabilitation  PMR Pre-admission Date of Service: 11/06/2016 11:30 AM  Related encounter: ED to Hosp-Admission (Discharged) from 10/31/2016 in [place]       [] Hide copied text PMR Admission Coordinator Pre-Admission Assessment  Patient: [name] is an 61 y.o., female MRN: [id number] DOB: 1955/12/26 Height: 5\' 8"  (172.7 cm) Weight: 103.8 kg (228 lb 13.4 oz)                                                                                                                                                  Insurance Information HMO: NO   PPO:       PCP:       IPA:       80/20:       OTHER:   PRIMARY:  Medicaid [place] access      Policy#:  [id number]      Subscriber:  Wynn Banker CM Name:        Phone#:       Fax#:   Pre-Cert#:        Employer:  Unemployed/ disabled Benefits:  Phone #:  [phone]     Name:  Automated Eff. Date:  Eligible 11/06/16 with coverage code [id number]     Deduct:        Out of Pocket Max:        Life Max:   CIR:        SNF:   Outpatient:       Co-Pay:   Home Health:        Co-Pay:   DME:       Co-Pay:   Providers:    Medicaid Application Date:        Case Manager:   Disability Application Date:        Case Worker:    Emergency Contact Information        Contact Information    Name Relation Home Work Mobile   [place] Daughter [phone]     [place] Significant other   [phone]   Merita Norton [phone]       Current Medical History  Patient Admitting Diagnosis:  R lacunar infarct, meningo encephalitis  History of Present Illness:  A 61 y.o.female admitted on 10/31/2016 for altered mental status and incontinence. Evidence of hyperosmolar nonketotic state with encephalopathy. Also felt to have sepsis and empiric antibiotics were started. Patient also had evidence of acute on chronic renal  failure with shock kidney. There was some twitching of the left face, left upper extremity with possible seizure. Seizures were treated with Keppra and felt to be related to severe hyperglycemia. 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 MRI on 11/02/2016 demonstrated globus pallidus. Acute lacunar infarct on the right side. 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 Other workup included lumbar puncture demonstrating pleocytosis started on antibiotics. ID on consult. 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 CIR stay, attending physician, Dr. Naaman Plummer, 12 1 through 06/20/2016, discharged at a level of supervision for transfers, min assist without assisted device for ambulation. Min assist. ADLs. Patient with residual right hemiparesis.   Total: 0=NIH  Past Medical History      Past Medical History:  Diagnosis Date  . Diabetes mellitus    Hb A1C = 12.6 on 05/15/11, managed on Novolog 70/30, 35 U qam, 25 U qpm  . Hypertension    poorly controlled  . Shortness of breath     Family History  family history is not on file.  Prior Rehab/Hospitalizations: Was on CIR after CVA November 2017.  Has the patient had major surgery during 100 days prior to admission? No  Current Medications   Current Facility-Administered Medications:  .  acetaminophen (TYLENOL) tablet 650 mg, 650 mg, Oral, Q6H PRN, Greta Doom, MD, 650 mg at 11/05/16 2222 .  amLODipine (NORVASC) tablet 10 mg, 10 mg, Oral, Daily, Cherene Altes, MD, 10 mg at 11/06/16 1049 .  aspirin chewable tablet 81 mg, 81 mg, Oral, Daily, Roxan Hockey, MD, 81 mg at 11/06/16 1048 .  atorvastatin (LIPITOR) tablet 40 mg, 40 mg, Oral, q1800, Roxan Hockey, MD, 40 mg  at 11/05/16 1800 .  cefTRIAXone (ROCEPHIN) 2 g in dextrose 5 % 50 mL IVPB, 2 g, Intravenous, Q12H, Roxan Hockey, MD, Stopped at 11/05/16 2254 .  dextrose 50 % solution 25 mL, 25 mL, Intravenous, PRN, Jonetta Osgood, MD .  enoxaparin (LOVENOX) injection 40 mg, 40 mg, Subcutaneous, Q24H, Dron Tanna Furry, MD, 40 mg at 11/05/16 1321 .  folic acid (FOLVITE) tablet 1 mg, 1 mg, Oral, Daily, Cherene Altes, MD, 1 mg at 11/06/16 1048 .  hydrALAZINE (APRESOLINE) injection 20 mg, 20 mg, Intravenous, Q4H PRN, Jose Angelo A Corrie Dandy, MD, 20 mg at 11/05/16 0121 .  insulin aspart (novoLOG) injection 0-20 Units, 0-20 Units, Subcutaneous, TID WC, Cherene Altes, MD, 3 Units at 11/06/16 [phone] .  insulin aspart (novoLOG) injection 0-5 Units, 0-5 Units, Subcutaneous, QHS, Cherene Altes, MD, 2 Units at 11/05/16 2249 .  insulin glargine (LANTUS) injection 30 Units, 30 Units, Subcutaneous, BID, Roxan Hockey, MD, 30 Units at 11/06/16 1047 .  levETIRAcetam (KEPPRA) 1,000 mg in sodium chloride 0.9 % 100 mL IVPB, 1,000 mg, Intravenous, BID, Ritta Slot, NP, Last Rate: 440 mL/hr at 11/06/16 1053, 1,000 mg at 11/06/16 1053 .  lisinopril (PRINIVIL,ZESTRIL) tablet 20 mg, 20 mg, Oral, Daily, Dron Tanna Furry, MD, 20 mg at 11/06/16 1048 .  metoprolol tartrate (LOPRESSOR) tablet 25 mg, 25 mg, Oral, BID, Dron Tanna Furry, MD, 25 mg at 11/06/16 1048 .  multivitamin with minerals tablet 1 tablet, 1 tablet, Oral, Daily, Cherene Altes, MD, 1 tablet at 11/06/16 1048 .  nitroGLYCERIN (NITROGLYN) 2 % ointment 1 inch, 1 inch, Topical, Q6H, Gardiner Barefoot, NP, 1 inch at 11/06/16 0103 .  pantoprazole (PROTONIX) EC tablet 40 mg, 40 mg, Oral, Q1200, Cherene Altes, MD, 40 mg at 11/05/16 1322 .  potassium chloride SA (K-DUR,KLOR-CON) CR tablet 40 mEq, 40 mEq, Oral, BID, Dron Tanna Furry, MD, 40 mEq at 11/06/16 1048 .  vancomycin (VANCOCIN) 1,250  mg in sodium chloride 0.9 % 250 mL IVPB, 1,250 mg,  Intravenous, Q24H, Priscella Mann, RPH, Stopped at 11/05/16 1930  Patients Current Diet: DIET DYS 3 Room service appropriate? Yes; Fluid consistency: Thin  Precautions / Restrictions Precautions Precautions: Fall Precaution Comments: Bowel/urinary incontinence Restrictions Weight Bearing Restrictions: No   Has the patient had 2 or more falls or a fall with injury in the past year?No  Prior Activity Level Limited Community (1-2x/wk): Went out 2-3 X a week, not driving, aide for [name] drives for them.  Home Assistive Devices / Equipment Home Assistive Devices/Equipment: None Home Equipment: None (all DME for significant other)  Prior Device Use: Indicate devices/aids used by the patient prior to current illness, exacerbation or injury? Walker  Prior Functional Level Prior Function Level of Independence: Independent Comments: Takes care of her significant other providing physical assistance for his ADL.  Self Care: Did the patient need help bathing, dressing, using the toilet or eating?  Independent  Indoor Mobility: Did the patient need assistance with walking from room to room (with or without device)? Independent  Stairs: Did the patient need assistance with internal or external stairs (with or without device)? Independent  Functional Cognition: Did the patient need help planning regular tasks such as shopping or remembering to take medications? Independent  Current Functional Level Cognition  Arousal/Alertness: Awake/alert Overall Cognitive Status: Impaired/Different from baseline Current Attention Level: Sustained Orientation Level: Oriented to person, Oriented to place, Oriented to time Following Commands: Follows one step commands consistently Safety/Judgement: Decreased awareness of safety, Decreased awareness of deficits General Comments: Responds to simple commands i.e. walk to the sink, wash your hands, etc. Attention: Focused, Sustained Focused  Attention: Impaired Focused Attention Impairment: Verbal basic, Functional basic Sustained Attention: Impaired Sustained Attention Impairment: Verbal basic, Functional basic Memory: Impaired Memory Impairment: Storage deficit, Decreased recall of new information Awareness: Impaired Awareness Impairment: Intellectual impairment Problem Solving: Impaired Problem Solving Impairment: Verbal basic, Functional basic Executive Function: Reasoning, Sequencing, Organizing, Self Correcting, Initiating Reasoning: Impaired Reasoning Impairment: Verbal basic, Functional basic Sequencing: Impaired Sequencing Impairment: Verbal basic, Functional basic Organizing: Impaired Organizing Impairment: Verbal basic, Functional basic Initiating: Impaired Initiating Impairment: Verbal basic, Functional basic Self Correcting: Impaired Self Correcting Impairment: Verbal basic, Functional basic Safety/Judgment: Impaired Comments: poor adherance to swallowing precautions despite cues    Extremity Assessment (includes Sensation/Coordination)  Upper Extremity Assessment: Defer to OT evaluation  Lower Extremity Assessment: Generalized weakness (no focal weakness noted.)    ADLs  Overall ADL's : Needs assistance/impaired Grooming: Minimal assistance, Standing Upper Body Bathing: Sitting, Minimal assistance Lower Body Bathing: Minimal assistance, Sit to/from stand Upper Body Dressing : Moderate assistance, Sitting Upper Body Dressing Details (indicate cue type and reason): Mod assist due to decreased problem solving when attempting to don gown. Lower Body Dressing: Minimal assistance, Sit to/from stand Toilet Transfer: Minimal assistance, Ambulation, RW, BSC Toileting- Clothing Manipulation and Hygiene: Minimal assistance, Sit to/from stand Toileting - Clothing Manipulation Details (indicate cue type and reason): Pt laying in stool on arrival without awareness. Functional mobility during ADLs: Minimal  assistance, Rolling walker General ADL Comments: Problem solving and cognitive deficits primarily affecting ADL independence.     Mobility  Overal bed mobility: Needs Assistance Bed Mobility: Supine to Sit Supine to sit: Min guard Sit to supine: Min guard General bed mobility comments: Min guard, required cues for safety and technique but no physical assist on this visit.     Transfers  Overall transfer level: Needs assistance Equipment used: Rolling walker (  2 wheeled) Transfers: Sit to/from Stand Sit to Stand: Min assist General transfer comment: Min assist for boost, practiced from the bed, and from commode pt able to perform with min guard assist. VC for technique and hand placement. Able to stand at [facility] and perform pericare after bowel movement. Pt was incontinent during transfer of urine and stool, but was aware that she needed to go urgently. Demonstrates some instability that improved with RW support and cues for sequencing, with min assist for walker negotiation.    Ambulation / Gait / Stairs / Wheelchair Mobility  Ambulation/Gait Ambulation/Gait assistance: Museum/gallery curator (Feet): 15 Feet Assistive device: Rolling walker (2 wheeled) Gait Pattern/deviations: Step-through pattern, Decreased stride length, Staggering left, Staggering right, Wide base of support General Gait Details: Intermittent min assist for balance, distance limited to bedroom due to incontinence of bowel and stool. Assist with walker navigation and proximity for safety. Gait velocity: decreased Gait velocity interpretation: Below normal speed for age/gender    Posture / Balance Dynamic Sitting Balance Sitting balance - Comments: Leaning over onto elbows Balance Overall balance assessment: Needs assistance Sitting-balance support: No upper extremity supported, Feet supported Sitting balance-Leahy Scale: Good Sitting balance - Comments: Leaning over onto elbows Standing balance support:  Bilateral upper extremity supported, During functional activity, No upper extremity supported Standing balance-Leahy Scale: Fair Standing balance comment: Able to stand to perform pericare with intermittent min assist for balance.    Special needs/care consideration BiPAP/CPAP No CPM No Continuous Drip IV Needs IV antibiotics for 1 more day Dialysis No     Life Vest No Oxygen No Special Bed no Trach Size no Wound Vac (area) no     Skin no                             Bowel mgmt: Last BM 11/05/16 Bladder mgmt: Incontinence Diabetic mgmt Yes, on insulin at home Droplet Precautions: Yes   Previous Home Environment Living Arrangements: Spouse/significant other  Lives With: Significant other Available Help at Discharge: Friend(s), Family, Available PRN/intermittently Type of Home: House Home Layout: One level Home Access: Level entry Bathroom Shower/Tub: Chiropodist: [place]: No  Discharge Living Setting Plans for Discharge Living Setting: House, Lives with (comment) (Lives with significant other.) Type of Home at Discharge: House Discharge Home Layout: One level Discharge Home Access: Stairs to enter Entrance Stairs-Number of Steps: 3 steps Does the patient have any problems obtaining your medications?: No  Social/Family/Support Systems Patient Roles: Parent, Other (Comment) (Has a significant other and 3 children.) Contact Information: Royston Bake - daughter - [phone] Anticipated Caregiver: [name] [name]) Wynetta Emery - significant other for 30 + yrs. Anticipated Caregiver's Contact Information: [phone] Ability/Limitations of Caregiver: [name] is disabled from open heart surgery, not working, but can provide supervision at home Caregiver Availability: 24/7 Discharge Plan Discussed with Primary Caregiver: Yes Is Caregiver In Agreement with Plan?: Yes Does Caregiver/Family have Issues with Lodging/Transportation while Pt is  in Rehab?: No  Goals/Additional Needs Patient/Family Goal for Rehab: PT/OT/SLP supervision goals Expected length of stay: 7-10 days Cultural Considerations: None Dietary Needs: Dys 3, thin liquids diet Equipment Needs: TBD Pt/Family Agrees to Admission and willing to participate: Yes Program Orientation Provided & Reviewed with Pt/Caregiver Including Roles  & Responsibilities: Yes  Decrease burden of Care through IP rehab admission: N/A  Possible need for SNF placement upon discharge: Not anticipated  Patient Condition: This patient's condition remains as documented  in the consult dated 11/05/16, in which the Rehabilitation Physician determined and documented that the patient's condition is appropriate for intensive rehabilitative care in an inpatient rehabilitation facility. Will admit to inpatient rehab today.  Preadmission Screen Completed By:  Retta Diones, 11/06/2016 11:54 AM ______________________________________________________________________   Discussed status with Dr. Posey Pronto on 11/06/16 at 1154 and received telephone approval for admission today.  Admission Coordinator:  Retta Diones, time 1154/Date 11/06/16       Cosigned by: Ankit Lorie Phenix, MD at 11/06/2016 12:40 PM  Revision History

## 2016-11-06 NOTE — Discharge Summary (Addendum)
Physician Discharge Summary  [name] QIH:[id number] DOB: 06-22-1956 DOA: 10/31/2016  PCP: Philis Fendt, MD  Admit date: 10/31/2016 Discharge date: 11/06/2016  Admitted From: Home Disposition:CIR  Recommendations for Outpatient Follow-up:  1. Follow up with PCP in 1-2 weeks 2. Please obtain BMP/CBC in one week 3. Please administer the last dose of IV vancomycin and IV ceftriaxone tonight at rehab to complete the course.  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 When her husband's home health nurse came to the house the patient was found altered w/ on the couch w/ evidence of multiple episodes of incontinence. Upon arrival to the ED patient was non-verbal and unable to follow commands. WBC 14.2, Lactic Acid 4.65, Glucose 1200, and pH 7.2.Admitted by PCCM.  Events: 4/24 Admit w/ DKA - CT Head 4/24 >Brain atrophy and chronic white matter microvascular changes - resolution of remote left pontine hemorrhage  4/25 EEG - no evidence of active seizure  4/26 [facility] assumed care   # CNS infection, possible acute bacterial meningitis:  -Status post LP. Total WBC count 24 with 90% neutrophils which is probably because of receiving antibiotics before lumbar puncture. Follow up culture results. Evaluated by both neurology and infectious disease. ID recommended to continue vancomycin and ceftriaxone for total 7 days, will be completing after tonight's dose. I discussed with inpatient rehab today and they are able to administer the dose antibiotics there.  -CSF culture negative -Significant improvement in clinical symptoms. Mental status improved.  #  Acute lacunar infarction on the right side: This is likely small vessel disease as opposed to embolic as per neurologist. Patient recently had echo, no repeat echo recommended this time.  -Carotid ultrasound Doppler with no critical stenosis -Recommended Keppra 500 mg  twice a day for short-term by a neurologist. -Continue aspirin, statin -Ambulatory referral to neurologist made.  #Altered mental status likely due to combination of meningitis, DKA and CVA: Mental status significantly improved. She is alert awake oriented 3. No weakness.  #Type 2 diabetes on chronic insulin: Admitted with DKA which was resolved. Monitor blood sugar table. Continue current insulin regimen. Recommended to follow up with PCP  # Hypertension: Resume current medications. Monitor blood pressure.  # Acute on CKD stage 3: Serum creatinine level improved. Avoid toxins.  # Hypokalemia :  continue with oral potassium chloride. Recommended to monitor BMP within a week.  Left parotid space 2.1 cm soft tissue nodule Worrisome for neoplasm per Radiologist - will need ENT f/u . Recommended to follow up with PCP.  Discharge Diagnoses:  Principal Problem:   Meningitis Active Problems:   Essential hypertension   GERD   Pontine hemorrhage (HCC)   Seizure ([place])   ARF (acute renal failure) (HCC)   Severe sepsis ([place])   DKA (diabetic ketoacidoses) ([place])   AKI (acute kidney injury) [facility])   Disorientation    Discharge Instructions  Discharge Instructions    Ambulatory referral to Neurology    Complete by:  As directed    An appointment is requested in approximately: 4 weeks   Call MD for:  difficulty breathing, headache or visual disturbances    Complete by:  As directed    Call MD for:  extreme fatigue  Complete by:  As directed    Call MD for:  hives    Complete by:  As directed    Call MD for:  persistant dizziness or light-headedness    Complete by:  As directed    Call MD for:  persistant nausea  and vomiting    Complete by:  As directed    Call MD for:  severe uncontrolled pain    Complete by:  As directed    Call MD for:  temperature >100.4    Complete by:  As directed    Diet - low sodium heart healthy    Complete by:  As directed    Diet Carb Modified    Complete by:  As directed    Discharge instructions    Complete by:  As directed    Left parotid space 2.1 cm soft tissue nodule Worrisome for neoplasm per Radiologist - will need ENT f/u . Recommended to follow up with PCP.   Increase activity slowly    Complete by:  As directed    Increase activity slowly    Complete by:  As directed      Allergies as of 11/06/2016   No Known Allergies     Medication List    STOP taking these medications   simvastatin 10 MG tablet Commonly known as:  ZOCOR     TAKE these medications   amLODipine 10 MG tablet Commonly known as:  NORVASC Take 1 tablet (10 mg total) by mouth daily.   aspirin 81 MG chewable tablet Chew 1 tablet (81 mg total) by mouth daily. Start taking on:  11/07/2016   atorvastatin 40 MG tablet Commonly known as:  LIPITOR Take 1 tablet (40 mg total) by mouth daily at 6 PM.   cyclobenzaprine 10 MG tablet Commonly known as:  FLEXERIL Take 1 tablet (10 mg total) by mouth 2 (two) times daily as needed for muscle spasms.   folic acid 1 MG tablet Commonly known as:  FOLVITE Take 1 tablet (1 mg total) by mouth daily.   gabapentin 300 MG capsule Commonly known as:  NEURONTIN Take 1 capsule (300 mg total) by mouth at bedtime. What changed:  how much to take  when to take this   guaiFENesin 600 MG 12 hr tablet Commonly known as:  MUCINEX Take 1 tablet (600 mg total) by mouth 2 (two) times daily.   insulin aspart protamine- aspart (70-30) 100 UNIT/ML injection Commonly known as:  NOVOLOG MIX 70/30 Inject 0.33 mLs (33 Units total) into the skin 2 (two) times daily with a meal. What changed:  when to take this  Another medication with the same name  was removed. Continue taking this medication, and follow the directions you see here.   levETIRAcetam 500 MG tablet Commonly known as:  KEPPRA Take 1 tablet (500 mg total) by mouth 2 (two) times daily. What changed:  medication strength  how much to take  when to take this   lisinopril-hydrochlorothiazide 20-12.5 MG tablet Commonly known as:  PRINZIDE,ZESTORETIC Take 1 tablet by mouth daily.   metoprolol tartrate 25 MG tablet Commonly known as:  LOPRESSOR Take 1 tablet (25 mg total) by mouth 2 (two) times daily.   multivitamin with minerals Tabs tablet Take 1 tablet by mouth daily.   nicotine 14 mg/24hr patch Commonly known as:  NICODERM CQ - dosed in mg/24 hours 14 mg patch daily 2 weeks then 7 mg patch daily 3 weeks and stop   NOREL AD 4-10-325  MG Tabs Generic drug:  Chlorphen-PE-Acetaminophen Take 1 tablet by mouth 2 (two) times daily as needed for allergies.   pantoprazole 40 MG tablet Commonly known as:  PROTONIX Take 1 tablet (40 mg total) by mouth daily.   potassium chloride SA 20 MEQ tablet Commonly known as:  K-DUR,KLOR-CON Take 1 tablet (20 mEq total) by mouth daily.   PROAIR HFA 108 (90 Base) MCG/ACT inhaler Generic drug:  albuterol Inhale 2 puffs into the lungs 4 (four) times daily as needed for wheezing.   RESOURCE THICKENUP CLEAR Powd Take 360 g by mouth as needed.   sitaGLIPtin-metformin 50-500 MG tablet Commonly known as:  JANUMET Take 1 tablet by mouth 2 (two) times daily with a meal.   traMADol 50 MG tablet Commonly known as:  ULTRAM Take 1 tablet (50 mg total) by mouth daily as needed (for pain).   traZODone 100 MG tablet Commonly known as:  DESYREL Take 1 tablet (100 mg total) by mouth at bedtime.      Follow-up Information    Philis Fendt, MD. Schedule an appointment as soon as possible for a visit in 1 week(s).   Specialty:  Internal Medicine Contact information: [place] [address] [phone]           No Known Allergies  Consultations:  Neurology  Infectious disease  Initially admitted by PCCM  Subjective: Patient was seen and examined at bedside. She was alert awake and oriented. She verbalized understanding of going to rehabilitation. She agreed with the plan. Denies headache, dizziness, nausea, vomiting, chest pain or shortness of breath. Clinically improved.  Discharge Exam: Vitals:   11/06/16 0516 11/06/16 0909  BP: (!) 147/73 (!) 146/80  Pulse: 76 81  Resp: 20 20  Temp: 98.2 F (36.8 C) 98.2 F (36.8 C)   Vitals:   11/05/16 2232 11/06/16 0015 11/06/16 0516 11/06/16 0909  BP: (!) 159/70 (!) 153/71 (!) 147/73 (!) 146/80  Pulse:  88 76 81  Resp:  20 20 20   Temp:  98 F (36.7 C) 98.2 F (36.8 C) 98.2 F (36.8 C)  TempSrc:  Oral Oral Oral  SpO2:  97% 98% 99%  Weight:      Height:        General: Pt is alert, awake, not in acute distress Cardiovascular: RRR, S1/S2 +, no rubs, no gallops Respiratory: CTA bilaterally, no wheezing, no rhonchi Abdominal: Soft, NT, ND, bowel sounds + Extremities: no edema, no cyanosis    The results of significant diagnostics from this hospitalization (including imaging, microbiology, ancillary and laboratory) are listed below for reference.     Microbiology: Recent Results (from the past 240 hour(s))  Culture, blood (Routine x 2)     Status: Abnormal   Collection Time: 10/31/16  3:13 PM  Result Value Ref Range Status   Specimen Description BLOOD LEFT HAND  Final   Special Requests   Final    BOTTLES DRAWN AEROBIC AND ANAEROBIC Blood Culture adequate volume   Culture  Setup Time   Final    GRAM POSITIVE COCCI IN CLUSTERS IN BOTH AEROBIC AND ANAEROBIC BOTTLES CRITICAL RESULT CALLED TO, READ BACK BY AND VERIFIED WITH: PHARMD M [place] [phone] MLM    Culture (A)  Final    STAPHYLOCOCCUS SPECIES (COAGULASE NEGATIVE) THE SIGNIFICANCE OF ISOLATING THIS ORGANISM FROM A SINGLE SET OF BLOOD CULTURES WHEN MULTIPLE SETS  ARE DRAWN IS UNCERTAIN. PLEASE NOTIFY THE MICROBIOLOGY DEPARTMENT WITHIN ONE WEEK IF SPECIATION AND SENSITIVITIES ARE REQUIRED.  Report Status 11/03/2016 FINAL  Final  Blood Culture ID Panel (Reflexed)     Status: Abnormal   Collection Time: 10/31/16  3:13 PM  Result Value Ref Range Status   Enterococcus species NOT DETECTED NOT DETECTED Final   Listeria monocytogenes NOT DETECTED NOT DETECTED Final   Staphylococcus species DETECTED (A) NOT DETECTED Final    Comment: Methicillin (oxacillin) resistant coagulase negative staphylococcus. Possible blood culture contaminant (unless isolated from more than one blood culture draw or clinical case suggests pathogenicity). No antibiotic treatment is indicated for blood  culture contaminants. CRITICAL RESULT CALLED TO, READ BACK BY AND VERIFIED WITH: PHARMD M [place] [phone] MLM    Staphylococcus aureus NOT DETECTED NOT DETECTED Final   Methicillin resistance DETECTED (A) NOT DETECTED Final    Comment: CRITICAL RESULT CALLED TO, READ BACK BY AND VERIFIED WITH: PHARMD M [place] [phone] MLM    Streptococcus species NOT DETECTED NOT DETECTED Final   Streptococcus agalactiae NOT DETECTED NOT DETECTED Final   Streptococcus pneumoniae NOT DETECTED NOT DETECTED Final   Streptococcus pyogenes NOT DETECTED NOT DETECTED Final   Acinetobacter baumannii NOT DETECTED NOT DETECTED Final   Enterobacteriaceae species NOT DETECTED NOT DETECTED Final   Enterobacter cloacae complex NOT DETECTED NOT DETECTED Final   Escherichia coli NOT DETECTED NOT DETECTED Final   Klebsiella oxytoca NOT DETECTED NOT DETECTED Final   Klebsiella pneumoniae NOT DETECTED NOT DETECTED Final   Proteus species NOT DETECTED NOT DETECTED Final   Serratia marcescens NOT DETECTED NOT DETECTED Final   Haemophilus influenzae NOT DETECTED NOT DETECTED Final   Neisseria meningitidis NOT DETECTED NOT DETECTED Final   Pseudomonas aeruginosa NOT DETECTED NOT DETECTED Final   Candida  albicans NOT DETECTED NOT DETECTED Final   Candida glabrata NOT DETECTED NOT DETECTED Final   Candida krusei NOT DETECTED NOT DETECTED Final   Candida parapsilosis NOT DETECTED NOT DETECTED Final   Candida tropicalis NOT DETECTED NOT DETECTED Final  Culture, blood (Routine x 2)     Status: None   Collection Time: 10/31/16  3:27 PM  Result Value Ref Range Status   Specimen Description BLOOD LEFT ANTECUBITAL  Final   Special Requests IN PEDIATRIC BOTTLE Blood Culture adequate volume  Final   Culture NO GROWTH 5 DAYS  Final   Report Status 11/05/2016 FINAL  Final  Culture, Urine     Status: None   Collection Time: 10/31/16  7:43 PM  Result Value Ref Range Status   Specimen Description URINE, CATHETERIZED  Final   Special Requests NONE  Final   Culture NO GROWTH  Final   Report Status 11/02/2016 FINAL  Final  MRSA PCR Screening     Status: None   Collection Time: 10/31/16  9:24 PM  Result Value Ref Range Status   MRSA by PCR NEGATIVE NEGATIVE Final    Comment:        The GeneXpert MRSA Assay (FDA approved for NASAL specimens only), is one component of a comprehensive MRSA colonization surveillance program. It is not intended to diagnose MRSA infection nor to guide or monitor treatment for MRSA infections.   Respiratory Panel by PCR     Status: None   Collection Time: 11/01/16 10:03 AM  Result Value Ref Range Status   Adenovirus NOT DETECTED NOT DETECTED Final   Coronavirus 229E NOT DETECTED NOT DETECTED Final   Coronavirus HKU1 NOT DETECTED NOT DETECTED Final   Coronavirus NL63 NOT DETECTED NOT DETECTED Final   Coronavirus OC43 NOT DETECTED NOT  DETECTED Final   Metapneumovirus NOT DETECTED NOT DETECTED Final   Rhinovirus / Enterovirus NOT DETECTED NOT DETECTED Final   Influenza A NOT DETECTED NOT DETECTED Final   Influenza B NOT DETECTED NOT DETECTED Final   Parainfluenza Virus 1 NOT DETECTED NOT DETECTED Final   Parainfluenza Virus 2 NOT DETECTED NOT DETECTED Final    Parainfluenza Virus 3 NOT DETECTED NOT DETECTED Final   Parainfluenza Virus 4 NOT DETECTED NOT DETECTED Final   Respiratory Syncytial Virus NOT DETECTED NOT DETECTED Final   Bordetella pertussis NOT DETECTED NOT DETECTED Final   Chlamydophila pneumoniae NOT DETECTED NOT DETECTED Final   Mycoplasma pneumoniae NOT DETECTED NOT DETECTED Final  CSF culture     Status: None   Collection Time: 11/02/16  3:19 PM  Result Value Ref Range Status   Specimen Description CSF  Final   Special Requests ADDED [id number] [phone]  Final   Gram Stain   Final    WBC PRESENT, PREDOMINANTLY PMN NO ORGANISMS SEEN CYTOSPIN SMEAR    Culture NO GROWTH 3 DAYS  Final   Report Status 11/06/2016 FINAL  Final  Culture, blood (Routine X 2) w Reflex to ID Panel     Status: None (Preliminary result)   Collection Time: 11/02/16  7:04 PM  Result Value Ref Range Status   Specimen Description BLOOD RIGHT HAND  Final   Special Requests IN PEDIATRIC BOTTLE Blood Culture adequate volume  Final   Culture NO GROWTH 3 DAYS  Final   Report Status PENDING  Incomplete  Culture, blood (Routine X 2) w Reflex to ID Panel     Status: None (Preliminary result)   Collection Time: 11/02/16  7:04 PM  Result Value Ref Range Status   Specimen Description BLOOD LEFT HAND  Final   Special Requests   Final    BOTTLES DRAWN AEROBIC AND ANAEROBIC Blood Culture adequate volume   Culture NO GROWTH 3 DAYS  Final   Report Status PENDING  Incomplete     Labs: BNP (last 3 results) No results for input(s): BNP in the last 8760 hours. Basic Metabolic Panel:  Recent Labs Lab 11/01/16 0215  11/01/16 1329 11/02/16 0320 11/02/16 1906 11/03/16 0342 11/04/16 0525 11/04/16 0610 11/05/16 0353 11/06/16 0410  NA 161*  < > 156*  157* 157* 158* 154*  --  147* 143 143  K 2.8*  < > 3.8  3.9 3.4* 3.2* 3.3*  --  2.7* 4.9 3.2*  CL 128*  < > 129*  129* >130* 128* 128*  --  119* 117* 116*  CO2 23  < > 18*  17* 19* 20* 18*  --  20* 19* 21*  GLUCOSE 68   < > 313*  325* 245* 169* 165*  --  78 97 121*  BUN 35*  < > 25*  25* 22* 21* 17  --  10 6 6   CREATININE 2.21*  < > 2.14*  2.17* 2.04* 1.97* 1.68*  --  1.37* 1.28* 1.19*  CALCIUM 9.1  < > 8.4*  8.5* 8.6* 9.0 8.3*  --  8.1* 8.3* 8.3*  MG 2.3  --   --  2.1  --  2.0 1.6*  --  1.9  --   PHOS <1.0*  --  1.2* 1.4*  --  2.1*  --   --  3.3 3.9  < > = values in this interval not displayed. Liver Function Tests:  Recent Labs Lab 10/31/16 1513 11/04/16 0610 11/05/16 0353 11/06/16 0410  AST 20  19  --   --   ALT 16 14  --   --   ALKPHOS 69 37*  --   --   BILITOT 1.4* 0.4  --   --   PROT 9.0* 6.0*  --   --   ALBUMIN 3.4* 2.1* 2.3* 2.1*   No results for input(s): LIPASE, AMYLASE in the last 168 hours. No results for input(s): AMMONIA in the last 168 hours. CBC:  Recent Labs Lab 10/31/16 1513 11/01/16 0215 11/02/16 0320 11/03/16 0342 11/04/16 0610  WBC 14.2* 19.3* 19.6* 13.5* 8.8  NEUTROABS 11.8*  --   --   --   --   HGB 16.0* 15.7* 14.4 14.3 12.9  HCT 52.2* 46.2* 43.5 44.6 39.7  MCV 100.6* 91.1 91.8 93.3 92.1  PLT 270 258 194 175 171   Cardiac Enzymes: No results for input(s): CKTOTAL, CKMB, CKMBINDEX, TROPONINI in the last 168 hours. BNP: Invalid input(s): POCBNP CBG:  Recent Labs Lab 11/05/16 1549 11/05/16 1759 11/05/16 2135 11/06/16 0651 11/06/16 1119  GLUCAP 272* 262* 211* 129* 213*   D-Dimer No results for input(s): DDIMER in the last 72 hours. Hgb A1c  Recent Labs  11/04/16 0610  HGBA1C 13.7*   Lipid Profile  Recent Labs  11/04/16 0610  CHOL 134  HDL 26*  LDLCALC 82  TRIG 131  CHOLHDL 5.2   Thyroid function studies No results for input(s): TSH, T4TOTAL, T3FREE, THYROIDAB in the last 72 hours.  Invalid input(s): FREET3 Anemia work up No results for input(s): VITAMINB12, FOLATE, FERRITIN, TIBC, IRON, RETICCTPCT in the last 72 hours. Urinalysis    Component Value Date/Time   COLORURINE YELLOW 10/31/2016 1513   APPEARANCEUR CLEAR 10/31/2016  1513   LABSPEC 1.024 10/31/2016 1513   PHURINE 5.0 10/31/2016 1513   GLUCOSEU >=500 (A) 10/31/2016 1513   GLUCOSEU > 1000 mg/dL (A) 11/01/2007 2056   HGBUR MODERATE (A) 10/31/2016 1513   HGBUR negative 07/15/2007 0954   BILIRUBINUR NEGATIVE 10/31/2016 1513   KETONESUR 5 (A) 10/31/2016 1513   PROTEINUR >=300 (A) 10/31/2016 1513   UROBILINOGEN 1.0 04/09/2012 1524   NITRITE NEGATIVE 10/31/2016 1513   LEUKOCYTESUR NEGATIVE 10/31/2016 1513   Sepsis Labs Invalid input(s): PROCALCITONIN,  WBC,  LACTICIDVEN Microbiology Recent Results (from the past 240 hour(s))  Culture, blood (Routine x 2)     Status: Abnormal   Collection Time: 10/31/16  3:13 PM  Result Value Ref Range Status   Specimen Description BLOOD LEFT HAND  Final   Special Requests   Final    BOTTLES DRAWN AEROBIC AND ANAEROBIC Blood Culture adequate volume   Culture  Setup Time   Final    GRAM POSITIVE COCCI IN CLUSTERS IN BOTH AEROBIC AND ANAEROBIC BOTTLES CRITICAL RESULT CALLED TO, READ BACK BY AND VERIFIED WITH: PHARMD M [place] [phone] MLM    Culture (A)  Final    STAPHYLOCOCCUS SPECIES (COAGULASE NEGATIVE) THE SIGNIFICANCE OF ISOLATING THIS ORGANISM FROM A SINGLE SET OF BLOOD CULTURES WHEN MULTIPLE SETS ARE DRAWN IS UNCERTAIN. PLEASE NOTIFY THE MICROBIOLOGY DEPARTMENT WITHIN ONE WEEK IF SPECIATION AND SENSITIVITIES ARE REQUIRED.    Report Status 11/03/2016 FINAL  Final  Blood Culture ID Panel (Reflexed)     Status: Abnormal   Collection Time: 10/31/16  3:13 PM  Result Value Ref Range Status   Enterococcus species NOT DETECTED NOT DETECTED Final   Listeria monocytogenes NOT DETECTED NOT DETECTED Final   Staphylococcus species DETECTED (A) NOT DETECTED Final    Comment: Methicillin (oxacillin)  resistant coagulase negative staphylococcus. Possible blood culture contaminant (unless isolated from more than one blood culture draw or clinical case suggests pathogenicity). No antibiotic treatment is indicated for blood   culture contaminants. CRITICAL RESULT CALLED TO, READ BACK BY AND VERIFIED WITH: PHARMD M [place] [phone] MLM    Staphylococcus aureus NOT DETECTED NOT DETECTED Final   Methicillin resistance DETECTED (A) NOT DETECTED Final    Comment: CRITICAL RESULT CALLED TO, READ BACK BY AND VERIFIED WITH: PHARMD M [place] [phone] MLM    Streptococcus species NOT DETECTED NOT DETECTED Final   Streptococcus agalactiae NOT DETECTED NOT DETECTED Final   Streptococcus pneumoniae NOT DETECTED NOT DETECTED Final   Streptococcus pyogenes NOT DETECTED NOT DETECTED Final   Acinetobacter baumannii NOT DETECTED NOT DETECTED Final   Enterobacteriaceae species NOT DETECTED NOT DETECTED Final   Enterobacter cloacae complex NOT DETECTED NOT DETECTED Final   Escherichia coli NOT DETECTED NOT DETECTED Final   Klebsiella oxytoca NOT DETECTED NOT DETECTED Final   Klebsiella pneumoniae NOT DETECTED NOT DETECTED Final   Proteus species NOT DETECTED NOT DETECTED Final   Serratia marcescens NOT DETECTED NOT DETECTED Final   Haemophilus influenzae NOT DETECTED NOT DETECTED Final   Neisseria meningitidis NOT DETECTED NOT DETECTED Final   Pseudomonas aeruginosa NOT DETECTED NOT DETECTED Final   Candida albicans NOT DETECTED NOT DETECTED Final   Candida glabrata NOT DETECTED NOT DETECTED Final   Candida krusei NOT DETECTED NOT DETECTED Final   Candida parapsilosis NOT DETECTED NOT DETECTED Final   Candida tropicalis NOT DETECTED NOT DETECTED Final  Culture, blood (Routine x 2)     Status: None   Collection Time: 10/31/16  3:27 PM  Result Value Ref Range Status   Specimen Description BLOOD LEFT ANTECUBITAL  Final   Special Requests IN PEDIATRIC BOTTLE Blood Culture adequate volume  Final   Culture NO GROWTH 5 DAYS  Final   Report Status 11/05/2016 FINAL  Final  Culture, Urine     Status: None   Collection Time: 10/31/16  7:43 PM  Result Value Ref Range Status   Specimen Description URINE, CATHETERIZED  Final    Special Requests NONE  Final   Culture NO GROWTH  Final   Report Status 11/02/2016 FINAL  Final  MRSA PCR Screening     Status: None   Collection Time: 10/31/16  9:24 PM  Result Value Ref Range Status   MRSA by PCR NEGATIVE NEGATIVE Final    Comment:        The GeneXpert MRSA Assay (FDA approved for NASAL specimens only), is one component of a comprehensive MRSA colonization surveillance program. It is not intended to diagnose MRSA infection nor to guide or monitor treatment for MRSA infections.   Respiratory Panel by PCR     Status: None   Collection Time: 11/01/16 10:03 AM  Result Value Ref Range Status   Adenovirus NOT DETECTED NOT DETECTED Final   Coronavirus 229E NOT DETECTED NOT DETECTED Final   Coronavirus HKU1 NOT DETECTED NOT DETECTED Final   Coronavirus NL63 NOT DETECTED NOT DETECTED Final   Coronavirus OC43 NOT DETECTED NOT DETECTED Final   Metapneumovirus NOT DETECTED NOT DETECTED Final   Rhinovirus / Enterovirus NOT DETECTED NOT DETECTED Final   Influenza A NOT DETECTED NOT DETECTED Final   Influenza B NOT DETECTED NOT DETECTED Final   Parainfluenza Virus 1 NOT DETECTED NOT DETECTED Final   Parainfluenza Virus 2 NOT DETECTED NOT DETECTED Final   Parainfluenza Virus 3 NOT  DETECTED NOT DETECTED Final   Parainfluenza Virus 4 NOT DETECTED NOT DETECTED Final   Respiratory Syncytial Virus NOT DETECTED NOT DETECTED Final   Bordetella pertussis NOT DETECTED NOT DETECTED Final   Chlamydophila pneumoniae NOT DETECTED NOT DETECTED Final   Mycoplasma pneumoniae NOT DETECTED NOT DETECTED Final  CSF culture     Status: None   Collection Time: 11/02/16  3:19 PM  Result Value Ref Range Status   Specimen Description CSF  Final   Special Requests ADDED [id number] [phone]  Final   Gram Stain   Final    WBC PRESENT, PREDOMINANTLY PMN NO ORGANISMS SEEN CYTOSPIN SMEAR    Culture NO GROWTH 3 DAYS  Final   Report Status 11/06/2016 FINAL  Final  Culture, blood (Routine X 2) w Reflex  to ID Panel     Status: None (Preliminary result)   Collection Time: 11/02/16  7:04 PM  Result Value Ref Range Status   Specimen Description BLOOD RIGHT HAND  Final   Special Requests IN PEDIATRIC BOTTLE Blood Culture adequate volume  Final   Culture NO GROWTH 3 DAYS  Final   Report Status PENDING  Incomplete  Culture, blood (Routine X 2) w Reflex to ID Panel     Status: None (Preliminary result)   Collection Time: 11/02/16  7:04 PM  Result Value Ref Range Status   Specimen Description BLOOD LEFT HAND  Final   Special Requests   Final    BOTTLES DRAWN AEROBIC AND ANAEROBIC Blood Culture adequate volume   Culture NO GROWTH 3 DAYS  Final   Report Status PENDING  Incomplete     Time coordinating discharge: 32 minutes  SIGNED:   Rosita Fire, MD  [facility] 11/06/2016, 12:45 PM  If 7PM-7AM, please contact night-coverage [email or web address] Password [email or web address]

## 2016-11-06 NOTE — Progress Notes (Signed)
[name], MD Physician Signed Physical Medicine and Rehabilitation  Consult Note Date of Service: 11/05/2016 10:50 AM  Related encounter: ED to Hosp-Admission (Discharged) from 10/31/2016 in [place] All Collapse All   [] Hide copied text [] Hover for attribution information  Physical Medicine and Rehabilitation Consult Reason for Consult:CVA Referring Phsyician: MILLI WOOLRIDGE [name] is an 61 y.o. female.   HPI: Patient admitted on 10/31/2016 for altered mental status and incontinence. Evidence of hyperosmolar nonketotic state with encephalopathy. Also felt to have sepsis and empiric antibiotics were started. Patient also had evidence of acute on chronic renal failure with shock kidney. There was some twitching of the left face, left upper extremity with possible seizure. Seizures were treated with Keppra and felt to be related to severe hyperglycemia. 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 MRI on 11/02/2016 demonstrated globus pallidus. Acute lacunar infarct on the right side. 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 Other workup included lumbar puncture demonstrating pleocytosis started on antibiotics. ID on consult. 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 CIR stay, attending physician, Dr. Naaman Plummer, 12 1 through 06/20/2016, discharged at a level of supervision for transfers, min assist without assisted device for ambulation. Min assist. ADLs. Patient with residual right hemiparesis.  Review of Systems  Constitutional: Negative for chills and weight loss.  HENT: Positive for hearing loss. Negative for congestion.   Eyes: Positive for blurred vision. Negative for double  vision.  Respiratory: Positive for cough and sputum production. Negative for hemoptysis, shortness of breath, wheezing and stridor.   Cardiovascular: Negative for chest pain and leg swelling.  Gastrointestinal: Positive for heartburn. Negative for abdominal pain, nausea and vomiting.  Genitourinary: Negative for dysuria and flank pain.  Musculoskeletal: Positive for joint pain. Negative for back pain and myalgias.  Skin: Negative for itching and rash.  Neurological: Positive for tingling, weakness and headaches. Negative for focal weakness.  Endo/Heme/Allergies: Negative for polydipsia. Does not bruise/bleed easily.  Psychiatric/Behavioral: Positive for substance abuse. Negative for depression.        Past Medical History:  Diagnosis Date  . Diabetes mellitus    Hb A1C = 12.6 on 05/15/11, managed on Novolog 70/30, 35 U qam, 25 U qpm  . Hypertension    poorly controlled  . Shortness of breath         Past Surgical History:  Procedure Laterality Date  . ANKLE FRACTURE SURGERY  2007   No family history on file. Social History:  reports that she has been smoking.  She has a 12.50 pack-year smoking history. She has never used smokeless tobacco. She reports that she drinks about 7.2 oz of alcohol per week . She reports that she uses drugs, including Cocaine. Allergies: No Known Allergies       Medications Prior to Admission  Medication Sig Dispense Refill  . amLODipine (NORVASC) 10 MG tablet Take 1 tablet (10 mg total) by mouth daily. 30 tablet 1  . cyclobenzaprine (FLEXERIL) 10 MG tablet Take 1 tablet (10 mg total) by mouth 2 (two) times daily as needed for muscle spasms. 60 tablet 0  . folic acid (FOLVITE) 1 MG tablet Take 1 tablet (1 mg total) by mouth daily. 30 tablet 0  . gabapentin (NEURONTIN) 300 MG capsule Take  2 capsules (600 mg total) by mouth 4 (four) times daily. 180 capsule 0  . guaiFENesin (MUCINEX) 600 MG 12 hr tablet Take 1 tablet (600 mg total) by mouth 2 (two)  times daily. 60 tablet 0  . levETIRAcetam (KEPPRA) 1000 MG tablet Take 1 tablet (1,000 mg total) by mouth every 12 (twelve) hours. 60 tablet 0  . lisinopril-hydrochlorothiazide (PRINZIDE,ZESTORETIC) 20-12.5 MG tablet Take 1 tablet by mouth daily.    . metoprolol tartrate (LOPRESSOR) 25 MG tablet Take 1 tablet (25 mg total) by mouth 2 (two) times daily. 60 tablet 0  . NOREL AD 4-10-325 MG TABS Take 1 tablet by mouth 2 (two) times daily as needed for allergies.  0  . pantoprazole (PROTONIX) 40 MG tablet Take 1 tablet (40 mg total) by mouth daily. 30 tablet 0  . PROAIR HFA 108 (90 Base) MCG/ACT inhaler Inhale 2 puffs into the lungs 4 (four) times daily as needed for wheezing.  0  . simvastatin (ZOCOR) 10 MG tablet Take 1 tablet (10 mg total) by mouth at bedtime. 30 tablet 0  . sitaGLIPtin-metformin (JANUMET) 50-500 MG tablet Take 1 tablet by mouth 2 (two) times daily with a meal.    . traMADol (ULTRAM) 50 MG tablet Take 1 tablet (50 mg total) by mouth daily as needed (for pain). 20 tablet 0  . traZODone (DESYREL) 100 MG tablet Take 1 tablet (100 mg total) by mouth at bedtime. 30 tablet 0  . insulin aspart protamine- aspart (NOVOLOG MIX 70/30) (70-30) 100 UNIT/ML injection Inject 0.33 mLs (33 Units total) into the skin daily with supper. (Patient not taking: Reported on 11/01/2016) 10 mL 11  . insulin aspart protamine- aspart (NOVOLOG MIX 70/30) (70-30) 100 UNIT/ML injection Inject 0.53 mLs (53 Units total) into the skin daily with breakfast. (Patient not taking: Reported on 11/01/2016) 10 mL 11  . Maltodextrin-Xanthan Gum (RESOURCE THICKENUP CLEAR) POWD Take 360 g by mouth as needed. 3 Can 1  . Multiple Vitamin (MULTIVITAMIN WITH MINERALS) TABS tablet Take 1 tablet by mouth daily.    . nicotine (NICODERM CQ - DOSED IN MG/24 HOURS) 14 mg/24hr patch 14 mg patch daily 2 weeks then 7 mg patch daily 3 weeks and stop 28 patch 0    Home: Home Living Family/patient expects to be discharged to::  Private residence Living Arrangements: Spouse/significant other Available Help at Discharge: Friend(s), Family, Available PRN/intermittently Type of Home: House Home Access: Level entry Home Layout: One level Bathroom Shower/Tub: Chiropodist: Standard Home Equipment: None (all DME for significant other)  Lives With: Significant other  Functional History: Prior Function Comments: Takes care of her significant other providing physical assistance for his ADL. Functional Status:  Mobility: Ambulation/Gait Ambulation Distance (Feet): 15 Feet Gait velocity: decreased General Gait Details: Intermittent min assist for balance, distance limited to bedroom due to incontinence of bowel and stool. Assist with walker navigation and proximity for safety.  ADL: ADL Upper Body Dressing Details (indicate cue type and reason): Mod assist due to decreased problem solving when attempting to don gown. Toileting - Clothing Manipulation Details (indicate cue type and reason): Pt laying in stool on arrival without awareness.  Cognition: Cognition Overall Cognitive Status: Impaired/Different from baseline Arousal/Alertness: Awake/alert Orientation Level: Oriented to person, Oriented to place, Oriented to time Attention: Focused, Sustained Focused Attention: Impaired Focused Attention Impairment: Verbal basic, Functional basic Sustained Attention: Impaired Sustained Attention Impairment: Verbal basic, Functional basic Memory: Impaired Memory Impairment: Storage deficit, Decreased recall of new information Awareness: Impaired  Awareness Impairment: Intellectual impairment Problem Solving: Impaired Problem Solving Impairment: Verbal basic, Functional basic Executive Function: Reasoning, Sequencing, Organizing, Self Correcting, Initiating Reasoning: Impaired Reasoning Impairment: Verbal basic, Functional basic Sequencing: Impaired Sequencing Impairment: Verbal basic, Functional  basic Organizing: Impaired Organizing Impairment: Verbal basic, Functional basic Initiating: Impaired Initiating Impairment: Verbal basic, Functional basic Self Correcting: Impaired Self Correcting Impairment: Verbal basic, Functional basic Safety/Judgment: Impaired Comments: poor adherance to swallowing precautions despite cues Cognition Arousal/Alertness: Lethargic Behavior During Therapy: Impulsive Overall Cognitive Status: Impaired/Different from baseline Area of Impairment: Attention, Memory, Following commands, Safety/judgement, Awareness, Problem solving Current Attention Level: Sustained Memory: Decreased short-term memory Following Commands: Follows one step commands consistently Safety/Judgement: Decreased awareness of safety, Decreased awareness of deficits Awareness: Intellectual Problem Solving: Slow processing, Decreased initiation, Difficulty sequencing, Requires verbal cues, Requires tactile cues General Comments: Responds to simple commands i.e. walk to the sink, wash your hands, etc.  Blood pressure (!) 189/95, pulse 99, temperature 98.6 F (37 C), temperature source Oral, resp. rate 18, height 5\' 8"  (1.727 m), weight 103.8 kg (228 lb 13.4 oz), SpO2 96 %. Physical Exam  Nursing note and vitals reviewed. Constitutional: She is oriented to person, place, and time. She appears well-developed.  Obese   HENT:  Head: Normocephalic and atraumatic.  Eyes: Conjunctivae and EOM are normal. Pupils are equal, round, and reactive to light.  Neck: Normal range of motion.  Cardiovascular: Normal rate, regular rhythm and normal heart sounds.  Exam reveals no friction rub.   No murmur heard. Respiratory: Effort normal and breath sounds normal. No respiratory distress. She has no wheezes.  GI: Soft. Bowel sounds are normal. She exhibits no distension. There is no tenderness.  Musculoskeletal: She exhibits no edema, tenderness or deformity.  Neurological: She is alert and  oriented to person, place, and time.  Motor strength is 4/5, bilateral deltoid, biceps, triceps, grip, hip flexion, knee extensor, ankle dorsiflexor Remembers 3/3 unrelated objects after delayed. Sensation intact to light touch bilateral upper and lower limbs  Psychiatric: She has a normal mood and affect. Thought content normal. Her speech is delayed. She is slowed.    Lab Results Last 24 Hours       Results for orders placed or performed during the hospital encounter of 10/31/16 (from the past 24 hour(s))  Glucose, capillary     Status: None   Collection Time: 11/04/16 11:49 AM  Result Value Ref Range   Glucose-Capillary 79 65 - 99 mg/dL  Glucose, capillary     Status: Abnormal   Collection Time: 11/04/16  1:09 PM  Result Value Ref Range   Glucose-Capillary 125 (H) 65 - 99 mg/dL  Glucose, capillary     Status: Abnormal   Collection Time: 11/04/16  4:49 PM  Result Value Ref Range   Glucose-Capillary 212 (H) 65 - 99 mg/dL  Glucose, capillary     Status: Abnormal   Collection Time: 11/04/16  9:10 PM  Result Value Ref Range   Glucose-Capillary 124 (H) 65 - 99 mg/dL   Comment 1 Notify RN    Comment 2 Document in Chart   Renal function panel     Status: Abnormal   Collection Time: 11/05/16  3:53 AM  Result Value Ref Range   Sodium 143 135 - 145 mmol/L   Potassium 4.9 3.5 - 5.1 mmol/L   Chloride 117 (H) 101 - 111 mmol/L   CO2 19 (L) 22 - 32 mmol/L   Glucose, Bld 97 65 - 99 mg/dL   BUN 6 6 -  20 mg/dL   Creatinine, Ser 1.28 (H) 0.44 - 1.00 mg/dL   Calcium 8.3 (L) 8.9 - 10.3 mg/dL   Phosphorus 3.3 2.5 - 4.6 mg/dL   Albumin 2.3 (L) 3.5 - 5.0 g/dL   GFR calc non Af Amer 45 (L) >60 mL/min   GFR calc Af Amer 52 (L) >60 mL/min   Anion gap 7 5 - 15  Magnesium     Status: None   Collection Time: 11/05/16  3:53 AM  Result Value Ref Range   Magnesium 1.9 1.7 - 2.4 mg/dL  Glucose, capillary     Status: Abnormal   Collection Time: 11/05/16  6:38 AM    Result Value Ref Range   Glucose-Capillary 105 (H) 65 - 99 mg/dL   Comment 1 Notify RN    Comment 2 Document in Chart       Imaging Results (Last 48 hours)  Dg Swallowing Func-speech Pathology  Result Date: 11/04/2016 Objective Swallowing Evaluation: Type of Study: MBS-Modified Barium Swallow Study Patient Details Name: [name] MRN: [id number] Date of Birth: 21-Jul-1955 Today's Date: 11/04/2016 Time: SLP Start Time (ACUTE ONLY): 1200-SLP Stop Time (ACUTE ONLY): 1220 SLP Time Calculation (min) (ACUTE ONLY): 20 min Past Medical History: Past Medical History: Diagnosis Date . Diabetes mellitus   Hb A1C = 12.6 on 05/15/11, managed on Novolog 70/30, 35 U qam, 25 U qpm . Hypertension   poorly controlled . Shortness of breath  Past Surgical History: Past Surgical History: Procedure Laterality Date . ANKLE FRACTURE SURGERY  5735 HPI: 61 year old female with PMH of DM, HTN, chronic back pain, tobacco/cocaine abuse, CKD III, and recent ICH (right residual hemiparesis). Presents to ED on 4/24 after being found at home with altered mental status. Per report patient typically takes care of husband and today when home health nurse came to the house, patient was found on the couch with multiple episodes of incontinence. Found ot have DKA, small acute lacunar infarct and right globus pallidus. CXR 4/24 >Slight bibasilar subsegmental atelectasisand mild vascular congestionPt has a history of pontine hemorrhage in 12/17 and had MBS at that time showing delayed swallow with aspiration of thin and trace aspiration of nectar. Pt recommended to consume nectar thick liquids with therapy to train a breath hold with swallowing.  Subjective: Alert, pleasant Assessment / Plan / Recommendation CHL IP CLINICAL IMPRESSIONS 11/04/2016 Clinical Impression Patient presents with mild-moderate oropharyngeal dysphagia which appears consistent with swallowing function observed during previous instrumental examinations. Oral  stage remarkable for prolonged mastication, delayed oral transit and decreased bolus cohesion with solid. Pharyngeal stage of swallowing characterized by adequate tongue base retraction, adequate anterior laryngeal movement, decreased laryngeal elevation, and adequate pharyngeal constriction. Swallow initiation delayed to the level of the pyriform sinuses with all consistencies, with the exception of barium pill, puree (valleculae trigger). Pharyngeal space notable for appearance of bony protrusions ~C4-5 which may have contributed to sensory deficits. Consistent penetration of thin and nectar thick liquids occurred during the swallow due to delayed swallow initiation/reduced closure of the laryngeal vestibule. Penetration occurred with all methods of bolus delivery (teaspoon, cup sip, straw, multiple sips), but was deeper with straw sips and multiple consecutive sips. Penetration was typically transient. Pt intermittently expelled deeper penetration with reflexive throat clear, though she required verbal cues to clear penetrate about 50% of the time. Chin tuck was trialed and observed to worsen, not prevent penetration. With barium pill delivered whole in puree, pt with immediate cough response as tablet entered valleculae, which was  not associated with penetration or aspiration. An esophageal sweep was performed revealing stasis of the tablet in the upper esophagus. Subsequent swallow of pureed solid was sufficent to move pill through the distal esophagus. Given the above findings and pt's stable chest x ray (10/31/16), recommend dys 3 diet with thin liquids via single cup sip with immediate throat clear after liquids, medications whole in puree. Pt is noted to be impulsive and given her cognitive deficits, she will require consistent monitoring to employ precautions. SLP will f/u for tolerance, training in compensatory strategies.  SLP Visit Diagnosis Dysphagia, oropharyngeal phase (R13.12) Attention and  concentration deficit following -- Frontal lobe and executive function deficit following -- Impact on safety and function Mild aspiration risk   CHL IP TREATMENT RECOMMENDATION 11/04/2016 Treatment Recommendations Therapy as outlined in treatment plan below   Prognosis 11/04/2016 Prognosis for Safe Diet Advancement Good Barriers to Reach Goals Cognitive deficits Barriers/Prognosis Comment -- CHL IP DIET RECOMMENDATION 11/04/2016 SLP Diet Recommendations Dysphagia 3 (Mech soft) solids;Thin liquid Liquid Administration via Cup Medication Administration Whole meds with puree Compensations Slow rate;Small sips/bites;Other (Comment) Postural Changes Seated upright at 90 degrees   CHL IP OTHER RECOMMENDATIONS 11/04/2016 Recommended Consults -- Oral Care Recommendations Oral care BID Other Recommendations --   CHL IP FOLLOW UP RECOMMENDATIONS 11/04/2016 Follow up Recommendations Other (comment)   CHL IP FREQUENCY AND DURATION 11/04/2016 Speech Therapy Frequency (ACUTE ONLY) min 2x/week Treatment Duration 2 weeks      CHL IP ORAL PHASE 11/04/2016 Oral Phase Impaired Oral - Pudding Teaspoon -- Oral - Pudding Cup -- Oral - Honey Teaspoon -- Oral - Honey Cup -- Oral - Nectar Teaspoon WFL Oral - Nectar Cup WFL Oral - Nectar Straw WFL Oral - Thin Teaspoon WFL Oral - Thin Cup WFL Oral - Thin Straw WFL Oral - Puree WFL Oral - Mech Soft -- Oral - Regular Impaired mastication;Decreased bolus cohesion;Delayed oral transit Oral - Multi-Consistency WFL Oral - Pill -- Oral Phase - Comment --  CHL IP PHARYNGEAL PHASE 11/04/2016 Pharyngeal Phase Impaired Pharyngeal- Pudding Teaspoon -- Pharyngeal -- Pharyngeal- Pudding Cup -- Pharyngeal -- Pharyngeal- Honey Teaspoon -- Pharyngeal -- Pharyngeal- Honey Cup -- Pharyngeal -- Pharyngeal- Nectar Teaspoon Delayed swallow initiation-pyriform sinuses;Reduced laryngeal elevation;Penetration/Aspiration before swallow;Reduced airway/laryngeal closure Pharyngeal Material enters airway, remains ABOVE vocal  cords and not ejected out;Material enters airway, remains ABOVE vocal cords then ejected out;Material does not enter airway Pharyngeal- Nectar Cup Delayed swallow initiation-pyriform sinuses;Reduced laryngeal elevation;Reduced airway/laryngeal closure;Penetration/Aspiration before swallow Pharyngeal Material enters airway, remains ABOVE vocal cords then ejected out;Material enters airway, remains ABOVE vocal cords and not ejected out Pharyngeal- Nectar Straw Delayed swallow initiation-pyriform sinuses;Reduced laryngeal elevation;Penetration/Aspiration before swallow Pharyngeal Material enters airway, remains ABOVE vocal cords then ejected out;Material enters airway, remains ABOVE vocal cords and not ejected out;Material enters airway, CONTACTS cords and then ejected out;Material enters airway, CONTACTS cords and not ejected out Pharyngeal- Thin Teaspoon Delayed swallow initiation-pyriform sinuses;Reduced laryngeal elevation;Penetration/Aspiration before swallow;Reduced airway/laryngeal closure Pharyngeal Material does not enter airway;Material enters airway, remains ABOVE vocal cords then ejected out;Material enters airway, remains ABOVE vocal cords and not ejected out Pharyngeal- Thin Cup Delayed swallow initiation-pyriform sinuses;Reduced laryngeal elevation;Reduced airway/laryngeal closure;Penetration/Aspiration before swallow Pharyngeal Material enters airway, remains ABOVE vocal cords then ejected out;Material enters airway, remains ABOVE vocal cords and not ejected out Pharyngeal- Thin Straw Delayed swallow initiation-pyriform sinuses;Reduced laryngeal elevation;Reduced airway/laryngeal closure;Penetration/Aspiration before swallow Pharyngeal Material enters airway, remains ABOVE vocal cords then ejected out;Material enters airway, remains ABOVE vocal cords and not ejected  out;Material enters airway, CONTACTS cords and then ejected out;Material enters airway, CONTACTS cords and not ejected out Pharyngeal-  Puree Delayed swallow initiation-vallecula;Reduced laryngeal elevation Pharyngeal -- Pharyngeal- Mechanical Soft -- Pharyngeal -- Pharyngeal- Regular Delayed swallow initiation-pyriform sinuses;Reduced laryngeal elevation Pharyngeal -- Pharyngeal- Multi-consistency Delayed swallow initiation-vallecula;Reduced laryngeal elevation Pharyngeal -- Pharyngeal- Pill -- Pharyngeal -- Pharyngeal Comment --  CHL IP CERVICAL ESOPHAGEAL PHASE 11/04/2016 Cervical Esophageal Phase WFL Pudding Teaspoon -- Pudding Cup -- Honey Teaspoon -- Honey Cup -- Nectar Teaspoon -- Nectar Cup -- Nectar Straw -- Thin Teaspoon -- Thin Cup -- Thin Straw -- Puree -- Mechanical Soft -- Regular -- Multi-consistency -- Pill -- Cervical Esophageal Comment -- No flowsheet data found. 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 Does the need for close, 24 hr/day medical supervision in concert with the patient's rehab needs make it unreasonable for this patient to be served in a less intensive setting? Yes 2. Co-Morbidities requiring supervision/potential complications: Prior pontine bleed, diabetes, uncontrolled with probable retinopathy, history of substance abuse 3. Due to bladder management, bowel management, safety, skin/wound care, disease management, medication administration, pain management and patient education, does the patient require 24 hr/day rehab nursing? Yes 4. Does the patient require coordinated care of a physician, rehab nurse, PT (1-2 hrs/day, 5 days/week), OT (1-2 hrs/day, 5 days/week) and SLP (.5-1 hrs/day, 5 days/week) to address physical and functional deficits in the context of the above medical diagnosis(es)? Yes Addressing deficits in the following areas: balance, endurance, locomotion, strength, transferring, bowel/bladder control, bathing, dressing, feeding,  grooming and toileting 5. Can the patient actively participate in an intensive therapy program of at least 3 hrs of therapy per day at least 5 days per week? Yes 6. The potential for patient to make measurable gains while on inpatient rehab is good 7. Anticipated functional outcomes upon discharge from inpatients are sup PT, sup OT, supSLP 8. Estimated rehab length of stay to reach the above functional goals is: 7-10d 9. Does the patient have adequate social supports to accommodate these discharge functional goals? Yes 10. Anticipated D/C setting: Home 11. Anticipated post D/C treatments: [place] therapy 12. Overall Rehab/Functional Prognosis: good  RECOMMENDATIONS: This patient's condition is appropriate for continued rehabilitative care in the following setting: CIR Patient has agreed to participate in recommended program. Yes and Potentially Note that insurance prior authorization may be required for reimbursement for recommended care.  Comment:pt expressed wish to go home we discussed my rec for inpt rehab   [name] 11/05/2016     Routing History

## 2016-11-06 NOTE — Progress Notes (Signed)
SLP Cancellation Note  Patient Details Name: [name] MRN: [id number] DOB: 1955/07/29   Cancelled treatment:        Pt in wheelchair getting ready to transfer to inpatient rehab.    [name] 11/06/2016, 1:57 PM   [name].Ed [employer] [phone]

## 2016-11-06 NOTE — Care Management Note (Signed)
Case Management Note  Patient Details  Name: [name] MRN: [id number] Date of Birth: 06-Feb-1956  Subjective/Objective:                    Action/Plan: Pt discharging to CIR today. No further needs per CM.   Expected Discharge Date:                  Expected Discharge Plan:  [place]  In-House Referral:     Discharge planning Services     Post Acute Care Choice:    Choice offered to:     DME Arranged:    DME Agency:     HH Arranged:    [place] Agency:     Status of Service:  Completed, signed off  If discussed at [employer] of Stay Meetings, dates discussed:    Additional Comments:  Pollie Friar, RN 11/06/2016, 11:06 AM

## 2016-11-06 NOTE — PMR Pre-admission (Signed)
PMR Admission Coordinator Pre-Admission Assessment  Patient: [name] is an 61 y.o., female MRN: [id number] DOB: 1955/12/22 Height: 5\' 8"  (172.7 cm) Weight: 103.8 kg (228 lb 13.4 oz)              Insurance Information HMO: NO   PPO:       PCP:       IPA:       80/20:       OTHER:   PRIMARY:  Medicaid [place] access      Policy#:  [id number]      Subscriber:  Wynn Banker CM Name:        Phone#:       Fax#:   Pre-Cert#:        Employer:  Unemployed/ disabled Benefits:  Phone #:  [phone]     Name:  Automated Eff. Date:  Eligible 11/06/16 with coverage code [id number]     Deduct:        Out of Pocket Max:        Life Max:   CIR:        SNF:   Outpatient:       Co-Pay:   Home Health:        Co-Pay:   DME:       Co-Pay:   Providers:    Medicaid Application Date:        Case Manager:   Disability Application Date:        Case Worker:    Emergency Contact Information Contact Information    Name Relation Home Work Mobile   [place] Daughter [phone]     [place] Significant other   [phone]   Merita Norton [phone]       Current Medical History  Patient Admitting Diagnosis:  R lacunar infarct, meningo encephalitis  History of Present Illness:   A 61 y.o. female admitted on 10/31/2016 for altered mental status and incontinence. Evidence of hyperosmolar nonketotic state with encephalopathy. Also felt to have sepsis and empiric antibiotics were started. Patient also had evidence of acute on chronic renal failure with shock kidney. There was some twitching of the left face, left upper extremity with possible seizure. Seizures were treated with Keppra and felt to be related to severe hyperglycemia. 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 MRI on 11/02/2016 demonstrated globus pallidus. Acute lacunar infarct on the right side. 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 Other workup included lumbar puncture demonstrating pleocytosis started on antibiotics. ID on consult. 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 CIR stay, attending physician, Dr. Naaman Plummer, 12 1 through 06/20/2016, discharged at a level of supervision for transfers, min assist without assisted device for ambulation. Min assist. ADLs. Patient with residual right hemiparesis.   Total: 0=NIH  Past Medical History  Past Medical History:  Diagnosis Date  . Diabetes mellitus    Hb A1C = 12.6 on 05/15/11, managed on Novolog 70/30, 35 U qam, 25 U qpm  . Hypertension    poorly controlled  . Shortness of breath     Family History  family history is not on file.  Prior Rehab/Hospitalizations: Was on CIR after CVA November 2017.  Has the patient had major surgery during 100 days prior to admission? No  Current Medications   Current Facility-Administered Medications:  .  acetaminophen (TYLENOL) tablet 650 mg, 650 mg, Oral, Q6H PRN, Greta Doom, MD, 650 mg at 11/05/16 2222 .  amLODipine (NORVASC) tablet 10 mg, 10 mg, Oral, Daily, Cherene Altes, MD, 10 mg at 11/06/16 1049 .  aspirin chewable tablet 81 mg, 81 mg, Oral, Daily, Roxan Hockey, MD, 81 mg at 11/06/16 1048 .  atorvastatin (LIPITOR) tablet 40 mg, 40 mg, Oral, q1800, Roxan Hockey, MD, 40 mg at 11/05/16 1800 .  cefTRIAXone (ROCEPHIN) 2 g in dextrose 5 % 50 mL IVPB, 2 g, Intravenous, Q12H, Roxan Hockey, MD, Stopped at 11/05/16 2254 .  dextrose 50 % solution 25 mL, 25 mL, Intravenous, PRN, Jonetta Osgood, MD .  enoxaparin (LOVENOX) injection 40 mg, 40 mg, Subcutaneous, Q24H, Dron Tanna Furry, MD, 40 mg at 11/05/16 1321 .  folic acid (FOLVITE) tablet 1 mg, 1 mg, Oral, Daily, Cherene Altes, MD, 1 mg at 11/06/16 1048 .  hydrALAZINE  (APRESOLINE) injection 20 mg, 20 mg, Intravenous, Q4H PRN, Jose Angelo A Corrie Dandy, MD, 20 mg at 11/05/16 0121 .  insulin aspart (novoLOG) injection 0-20 Units, 0-20 Units, Subcutaneous, TID WC, Cherene Altes, MD, 3 Units at 11/06/16 [phone] .  insulin aspart (novoLOG) injection 0-5 Units, 0-5 Units, Subcutaneous, QHS, Cherene Altes, MD, 2 Units at 11/05/16 2249 .  insulin glargine (LANTUS) injection 30 Units, 30 Units, Subcutaneous, BID, Roxan Hockey, MD, 30 Units at 11/06/16 1047 .  levETIRAcetam (KEPPRA) 1,000 mg in sodium chloride 0.9 % 100 mL IVPB, 1,000 mg, Intravenous, BID, Ritta Slot, NP, Last Rate: 440 mL/hr at 11/06/16 1053, 1,000 mg at 11/06/16 1053 .  lisinopril (PRINIVIL,ZESTRIL) tablet 20 mg, 20 mg, Oral, Daily, Dron Tanna Furry, MD, 20 mg at 11/06/16 1048 .  metoprolol tartrate (LOPRESSOR) tablet 25 mg, 25 mg, Oral, BID, Dron Tanna Furry, MD, 25 mg at 11/06/16 1048 .  multivitamin with minerals tablet 1 tablet, 1 tablet, Oral, Daily, Cherene Altes, MD, 1 tablet at 11/06/16 1048 .  nitroGLYCERIN (NITROGLYN) 2 % ointment 1 inch, 1 inch, Topical, Q6H, Gardiner Barefoot, NP, 1 inch at 11/06/16 0103 .  pantoprazole (PROTONIX) EC tablet 40 mg, 40 mg, Oral, Q1200, Cherene Altes, MD, 40 mg at 11/05/16 1322 .  potassium chloride SA (K-DUR,KLOR-CON) CR tablet 40 mEq, 40 mEq, Oral, BID, Dron Tanna Furry, MD, 40 mEq at 11/06/16 1048 .  vancomycin (VANCOCIN) 1,250 mg in sodium chloride 0.9 % 250 mL IVPB, 1,250 mg, Intravenous, Q24H, Priscella Mann, RPH, Stopped at 11/05/16 1930  Patients Current Diet: DIET DYS 3 Room service appropriate? Yes; Fluid consistency: Thin  Precautions / Restrictions Precautions Precautions: Fall Precaution Comments: Bowel/urinary incontinence Restrictions Weight Bearing Restrictions: No   Has the patient had 2 or more falls or a fall with injury in the past year?No  Prior Activity Level Limited Community (1-2x/wk): Went out 2-3 X  a week, not driving, aide for [name] drives for them.  Home Assistive Devices / Equipment Home Assistive Devices/Equipment: None Home Equipment: None (all DME for significant other)  Prior Device Use: Indicate devices/aids used by the patient prior to current illness, exacerbation or injury? Walker  Prior Functional Level Prior Function Level of Independence: Independent Comments: Takes care of her significant other providing physical assistance for his ADL.  Self Care: Did the patient need help bathing, dressing, using the toilet or eating?  Independent  Indoor Mobility: Did the patient need assistance with walking from room to room (with or without device)? Independent  Stairs: Did the patient need assistance with internal or external stairs (with or without device)? Independent  Functional Cognition: Did the patient need help planning regular tasks such as shopping or remembering to take medications? Independent  Current Functional Level Cognition  Arousal/Alertness: Awake/alert Overall Cognitive Status: Impaired/Different from baseline Current Attention Level: Sustained Orientation Level: Oriented to person, Oriented to place, Oriented to time Following Commands: Follows one step commands consistently Safety/Judgement: Decreased awareness of safety, Decreased awareness of deficits General Comments: Responds to simple commands i.e. walk to the sink, wash your hands, etc. Attention: Focused, Sustained Focused Attention: Impaired Focused Attention Impairment: Verbal basic, Functional basic Sustained Attention: Impaired Sustained Attention Impairment: Verbal basic, Functional basic Memory: Impaired Memory Impairment: Storage deficit, Decreased recall of new information Awareness: Impaired Awareness Impairment: Intellectual impairment Problem Solving: Impaired Problem Solving Impairment: Verbal basic, Functional basic Executive Function: Reasoning, Sequencing, Organizing,  Self Correcting, Initiating Reasoning: Impaired Reasoning Impairment: Verbal basic, Functional basic Sequencing: Impaired Sequencing Impairment: Verbal basic, Functional basic Organizing: Impaired Organizing Impairment: Verbal basic, Functional basic Initiating: Impaired Initiating Impairment: Verbal basic, Functional basic Self Correcting: Impaired Self Correcting Impairment: Verbal basic, Functional basic Safety/Judgment: Impaired Comments: poor adherance to swallowing precautions despite cues    Extremity Assessment (includes Sensation/Coordination)  Upper Extremity Assessment: Defer to OT evaluation  Lower Extremity Assessment: Generalized weakness (no focal weakness noted.)    ADLs  Overall ADL's : Needs assistance/impaired Grooming: Minimal assistance, Standing Upper Body Bathing: Sitting, Minimal assistance Lower Body Bathing: Minimal assistance, Sit to/from stand Upper Body Dressing : Moderate assistance, Sitting Upper Body Dressing Details (indicate cue type and reason): Mod assist due to decreased problem solving when attempting to don gown. Lower Body Dressing: Minimal assistance, Sit to/from stand Toilet Transfer: Minimal assistance, Ambulation, RW, BSC Toileting- Clothing Manipulation and Hygiene: Minimal assistance, Sit to/from stand Toileting - Clothing Manipulation Details (indicate cue type and reason): Pt laying in stool on arrival without awareness. Functional mobility during ADLs: Minimal assistance, Rolling walker General ADL Comments: Problem solving and cognitive deficits primarily affecting ADL independence.     Mobility  Overal bed mobility: Needs Assistance Bed Mobility: Supine to Sit Supine to sit: Min guard Sit to supine: Min guard General bed mobility comments: Min guard, required cues for safety and technique but no physical assist on this visit.     Transfers  Overall transfer level: Needs assistance Equipment used: Rolling walker (2  wheeled) Transfers: Sit to/from Stand Sit to Stand: Min assist General transfer comment: Min assist for boost, practiced from the bed, and from commode pt able to perform with min guard assist. VC for technique and hand placement. Able to stand at [facility] and perform pericare after bowel movement. Pt was incontinent during transfer of urine and stool, but was aware that she needed to go urgently. Demonstrates some instability that improved with RW support and cues for sequencing, with min assist for walker negotiation.    Ambulation / Gait / Stairs / Wheelchair Mobility  Ambulation/Gait Ambulation/Gait assistance: Museum/gallery curator (Feet): 15 Feet Assistive device: Rolling walker (2 wheeled) Gait Pattern/deviations: Step-through pattern, Decreased stride length, Staggering left, Staggering right, Wide base of support General Gait Details: Intermittent min assist for balance, distance limited to bedroom due to incontinence of bowel and stool. Assist with walker navigation and proximity for safety. Gait velocity: decreased Gait velocity interpretation: Below normal speed for age/gender    Posture / Balance Dynamic Sitting Balance Sitting balance - Comments: Leaning over onto elbows Balance Overall balance assessment:  Needs assistance Sitting-balance support: No upper extremity supported, Feet supported Sitting balance-Leahy Scale: Good Sitting balance - Comments: Leaning over onto elbows Standing balance support: Bilateral upper extremity supported, During functional activity, No upper extremity supported Standing balance-Leahy Scale: Fair Standing balance comment: Able to stand to perform pericare with intermittent min assist for balance.    Special needs/care consideration BiPAP/CPAP No CPM No Continuous Drip IV Needs IV antibiotics for 1 more day Dialysis No     Life Vest No Oxygen No Special Bed no Trach Size no Wound Vac (area) no     Skin no                              Bowel mgmt: Last BM 11/05/16 Bladder mgmt: Incontinence Diabetic mgmt Yes, on insulin at home Droplet Precautions: Yes   Previous Home Environment Living Arrangements: Spouse/significant other  Lives With: Significant other Available Help at Discharge: Friend(s), Family, Available PRN/intermittently Type of Home: House Home Layout: One level Home Access: Level entry Bathroom Shower/Tub: Chiropodist: [place]: No  Discharge Living Setting Plans for Discharge Living Setting: House, Lives with (comment) (Lives with significant other.) Type of Home at Discharge: House Discharge Home Layout: One level Discharge Home Access: Stairs to enter Entrance Stairs-Number of Steps: 3 steps Does the patient have any problems obtaining your medications?: No  Social/Family/Support Systems Patient Roles: Parent, Other (Comment) (Has a significant other and 3 children.) Contact Information: Royston Bake - daughter - [phone] Anticipated Caregiver: [name] [name]) Wynetta Emery - significant other for 30 + yrs. Anticipated Caregiver's Contact Information: [phone] Ability/Limitations of Caregiver: [name] is disabled from open heart surgery, not working, but can provide supervision at home Caregiver Availability: 24/7 Discharge Plan Discussed with Primary Caregiver: Yes Is Caregiver In Agreement with Plan?: Yes Does Caregiver/Family have Issues with Lodging/Transportation while Pt is in Rehab?: No  Goals/Additional Needs Patient/Family Goal for Rehab: PT/OT/SLP supervision goals Expected length of stay: 7-10 days Cultural Considerations: None Dietary Needs: Dys 3, thin liquids diet Equipment Needs: TBD Pt/Family Agrees to Admission and willing to participate: Yes Program Orientation Provided & Reviewed with Pt/Caregiver Including Roles  & Responsibilities: Yes  Decrease burden of Care through IP rehab admission: N/A  Possible need for SNF placement  upon discharge: Not anticipated  Patient Condition: This patient's condition remains as documented in the consult dated 11/05/16, in which the Rehabilitation Physician determined and documented that the patient's condition is appropriate for intensive rehabilitative care in an inpatient rehabilitation facility. Will admit to inpatient rehab today.  Preadmission Screen Completed By:  Retta Diones, 11/06/2016 11:54 AM ______________________________________________________________________   Discussed status with Dr. Posey Pronto on 11/06/16 at 1154 and received telephone approval for admission today.  Admission Coordinator:  Retta Diones, time 1154/Date 11/06/16

## 2016-11-06 NOTE — Progress Notes (Addendum)
Arrived on unit 1405 by 5th floor nsg staff  Via wheelchair. A/Ox4, patients dentures (upper/lower), but not present. Pt able to make needs known. She is presented on the unit with x2 IV. Patient was able to inform staff of bathroom needs. Pt transferred from bed to wheelchair without any difficulties, used grab bars in the bathroom without usage of commands. LBM 11/06/16. Sent home pt's clothing by spouse's nursing tech (printed dress/yellow shirt). Patient came on the unit on droplet precaution charged nurse was informed by infectious control that pt no longer needed to be on precautions. Pt is supervised with meals. Pt hx notes seizures, pt denies having a seizure in the past. Pt does not remember "how who I came to the hospital." Writer discus pt substance abuse usage, UDS + cocaine. Pt admits she needs help. Pt noted she will ask the dr, if she could leave resulted in her need to pay her bills. Writer informed pt, "dr will not discharge resulted to her condition and you will be required to sign AMA."  Staff will continue to monitor and meet needs.

## 2016-11-06 NOTE — Progress Notes (Signed)
Rehab admissions - I met with patient.  I gave her rehab booklets and discussed inpatient rehab.  Patient would like inpatient rehab prior to home with her significant other, [name] [facility]).  Noted patient is on droplet precautions.  I will contact MD to see if patient is ready for inpatient rehab admission.  Call me for questions.  #[phone]

## 2016-11-07 ENCOUNTER — Inpatient Hospital Stay (HOSPITAL_COMMUNITY): Payer: Medicaid Other | Admitting: Occupational Therapy

## 2016-11-07 ENCOUNTER — Inpatient Hospital Stay (HOSPITAL_COMMUNITY): Payer: Medicaid Other | Admitting: Speech Pathology

## 2016-11-07 ENCOUNTER — Inpatient Hospital Stay (HOSPITAL_COMMUNITY): Payer: Self-pay | Admitting: Physical Therapy

## 2016-11-07 DIAGNOSIS — G039 Meningitis, unspecified: Secondary | ICD-10-CM

## 2016-11-07 DIAGNOSIS — R269 Unspecified abnormalities of gait and mobility: Principal | ICD-10-CM

## 2016-11-07 LAB — URINALYSIS, ROUTINE W REFLEX MICROSCOPIC
BILIRUBIN URINE: NEGATIVE
Bacteria, UA: NONE SEEN
KETONES UR: NEGATIVE mg/dL
LEUKOCYTES UA: NEGATIVE
Nitrite: NEGATIVE
PROTEIN: 100 mg/dL — AB
Specific Gravity, Urine: 1.011 (ref 1.005–1.030)
pH: 6 (ref 5.0–8.0)

## 2016-11-07 LAB — CBC WITH DIFFERENTIAL/PLATELET
Basophils Absolute: 0 10*3/uL (ref 0.0–0.1)
Basophils Relative: 0 %
EOS ABS: 0.3 10*3/uL (ref 0.0–0.7)
EOS PCT: 3 %
HCT: 37.8 % (ref 36.0–46.0)
Hemoglobin: 12.5 g/dL (ref 12.0–15.0)
Lymphocytes Relative: 31 %
Lymphs Abs: 2.8 10*3/uL (ref 0.7–4.0)
MCH: 30.3 pg (ref 26.0–34.0)
MCHC: 33.1 g/dL (ref 30.0–36.0)
MCV: 91.7 fL (ref 78.0–100.0)
MONO ABS: 1 10*3/uL (ref 0.1–1.0)
Monocytes Relative: 11 %
Neutro Abs: 4.8 10*3/uL (ref 1.7–7.7)
Neutrophils Relative %: 55 %
PLATELETS: 203 10*3/uL (ref 150–400)
RBC: 4.12 MIL/uL (ref 3.87–5.11)
RDW: 13.8 % (ref 11.5–15.5)
WBC: 8.9 10*3/uL (ref 4.0–10.5)

## 2016-11-07 LAB — CULTURE, BLOOD (ROUTINE X 2)
CULTURE: NO GROWTH
Culture: NO GROWTH
SPECIAL REQUESTS: ADEQUATE
SPECIAL REQUESTS: ADEQUATE

## 2016-11-07 LAB — COMPREHENSIVE METABOLIC PANEL
ALT: 20 U/L (ref 14–54)
AST: 34 U/L (ref 15–41)
Albumin: 2.1 g/dL — ABNORMAL LOW (ref 3.5–5.0)
Alkaline Phosphatase: 38 U/L (ref 38–126)
Anion gap: 6 (ref 5–15)
BUN: 6 mg/dL (ref 6–20)
CHLORIDE: 114 mmol/L — AB (ref 101–111)
CO2: 20 mmol/L — AB (ref 22–32)
CREATININE: 1.18 mg/dL — AB (ref 0.44–1.00)
Calcium: 8.5 mg/dL — ABNORMAL LOW (ref 8.9–10.3)
GFR calc non Af Amer: 49 mL/min — ABNORMAL LOW (ref >60)
GFR, EST AFRICAN AMERICAN: 57 mL/min — AB (ref >60)
Glucose, Bld: 206 mg/dL — ABNORMAL HIGH (ref 65–99)
POTASSIUM: 4.2 mmol/L (ref 3.5–5.1)
SODIUM: 140 mmol/L (ref 135–145)
Total Bilirubin: 0.9 mg/dL (ref 0.3–1.2)
Total Protein: 5.8 g/dL — ABNORMAL LOW (ref 6.5–8.1)

## 2016-11-07 LAB — GLUCOSE, CAPILLARY
GLUCOSE-CAPILLARY: 181 mg/dL — AB (ref 65–99)
GLUCOSE-CAPILLARY: 189 mg/dL — AB (ref 65–99)
Glucose-Capillary: 223 mg/dL — ABNORMAL HIGH (ref 65–99)
Glucose-Capillary: 332 mg/dL — ABNORMAL HIGH (ref 65–99)
Glucose-Capillary: 216 mg/dL — ABNORMAL HIGH (ref 65–99)

## 2016-11-07 LAB — HCV RNA QUANT
HCV QUANT LOG: 6.19 {Log_IU}/mL (ref >1.70)
HCV QUANT: 1550000 [IU]/mL (ref >50)

## 2016-11-07 MED ORDER — POTASSIUM CHLORIDE CRYS ER 20 MEQ PO TBCR
20.0000 meq | EXTENDED_RELEASE_TABLET | Freq: Two times a day (BID) | ORAL | Status: DC
  Administered 2016-11-07 – 2016-11-12 (×10): 20 meq via ORAL
  Filled 2016-11-07 (×10): qty 1

## 2016-11-07 NOTE — Progress Notes (Signed)
Patient information reviewed and entered into eRehab system by Raymar Joiner, RN, CRRN, PPS Coordinator.  Information including medical coding and functional independence measure will be reviewed and updated through discharge.    

## 2016-11-07 NOTE — Plan of Care (Signed)
Problem: RH BOWEL ELIMINATION Goal: RH STG MANAGE BOWEL WITH ASSISTANCE STG Manage Bowel with Assistance.  Outcome: Not Progressing Bowel incontinence  Problem: RH BLADDER ELIMINATION Goal: RH STG MANAGE BLADDER WITH ASSISTANCE STG Manage Bladder With Assistance  Outcome: Not Progressing Bladder incontinence

## 2016-11-07 NOTE — Progress Notes (Signed)
   Subjective/Complaints: RN notes bowel and bladder incont, could not obtain UA, PVR 143ml ROS:  Denies CP,SOB, N/V/D Objective: Vital Signs: Blood pressure 138/78, pulse 79, temperature 98 F (36.7 C), temperature source Oral, resp. rate 16, height 5\' 8"  (1.727 m), weight 97.8 kg (215 lb 11.2 oz), SpO2 99 %. No results found. Results for orders placed or performed during the hospital encounter of 11/06/16 (from the past 72 hour(s))  Glucose, capillary     Status: Abnormal   Collection Time: 11/06/16  4:43 PM  Result Value Ref Range   Glucose-Capillary 322 (H) 65 - 99 mg/dL  Glucose, capillary     Status: Abnormal   Collection Time: 11/06/16  9:56 PM  Result Value Ref Range   Glucose-Capillary 168 (H) 65 - 99 mg/dL  Glucose, capillary     Status: Abnormal   Collection Time: 11/07/16  5:49 AM  Result Value Ref Range   Glucose-Capillary 181 (H) 65 - 99 mg/dL  Glucose, capillary     Status: Abnormal   Collection Time: 11/07/16  6:43 AM  Result Value Ref Range   Glucose-Capillary 189 (H) 65 - 99 mg/dL     HEENT: normal Cardio: RRR and no murmur Resp: CTA B/L and unlabored GI: BS positive and non tender and non distended Extremity:  BS positive and NT, ND Skin:   Intact and Other IV sites ok Neuro: Alert/Oriented, Cranial Nerve II-XII normal, Normal Sensory, Abnormal Motor 4/5 in BUE and BLE, Abnormal FMC Ataxic/ dec FMC and Other Delayed responses Musc/Skel:  Normal and Other no pain with ROM Gen NAD   Assessment/Plan: 1. 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 Physiatrist is providing close team supervision and 24 hour management of active medical problems listed below. Physiatrist and rehab team continue to assess barriers to discharge/monitor patient progress toward functional and medical goals. FIM:                                   Medical Problem List and Plan: 1.  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 Initiate rehab today  2.  DVT Prophylaxis/Anticoagulation: Pharmaceutical: Lovenox 3. Pain Management: continue gabapentin for neuropathy.  4. Mood: LCSW to follow for evaluation and support.  5. Neuropsych: This patient is not fully capable of making decisions on her own behalf. 6. Skin/Wound Care: routine pressure relief measures 7. Fluids/Electrolytes/Nutrition: Monitor I/O. Check lytes in am.  8. T2DM with peripheral neuropathy: On lantus bid with SSI. Noncompliant with insulin use--continue to educate on importance of compliance. Monitor BS ac/hs  9. Chronic insomnia: Will try tylenol pm (used Advil pm at home) 10. H/o ICH: On keppra 1000 mg bid. 11. HTN: monitor BP bid. Continue Norvasc, Metoprolol and Lisinopril.  12. Meningoencephalitis: Vancomycin/ceftriaxone--completed 13. Diarrhea: will check stool for c diff.  14. Bladder incontinence: Check PVRs. Check UA/UCS need cath specimen given incont 15. Hypokalemia: Continue supplement.  16. Acute on chronic renal failure: Resolving --will recheck labs today  17. Polysubstance abuse: Counsel when appropriate.Neuropsych to eval 18. CVA: Cont meds  LOS (Days) 1 A FACE TO FACE EVALUATION WAS PERFORMED  KIRSTEINS,ANDREW E 11/07/2016, 7:00 AM

## 2016-11-07 NOTE — Progress Notes (Signed)
Pt had an incontinent episode with large amount of urination. Pt went to the bathroom with assistance but we were unable to achieve a urine sample for a laboratory specimen. Pt was scanned with a post void residual of 164cc.

## 2016-11-07 NOTE — Evaluation (Signed)
Occupational Therapy Assessment and Plan  Patient Details  Name: [name] MRN: [id number] Date of Birth: 02/13/1956  OT Diagnosis: altered mental status, cognitive deficits, disturbance of vision, hemiplegia affecting non-dominant side and muscle weakness (generalized) Rehab Potential: Rehab Potential (ACUTE ONLY): Good ELOS: 7-10 days   Today's Date: 11/07/2016 OT Individual Time: 5573-2202 OT Individual Time Calculation (min): 84 min     Problem List:  Patient Active Problem List   Diagnosis Date Noted  . Neurologic gait disorder   . Neuropathic pain   . Type 2 diabetes mellitus with peripheral neuropathy (HCC)   . History of intracranial hemorrhage   . Diarrhea   . Urinary incontinence   . Hypokalemia   . Polysubstance abuse   . Right sided cerebral hemisphere cerebrovascular accident (CVA) ([place])   . Disorientation   . Meningitis 11/02/2016  . Type 2 diabetes mellitus with hyperosmolar nonketotic hyperglycemia ([place]) 10/31/2016  . Seizure ([place]) 10/31/2016  . ARF (acute renal failure) ([place]) 10/31/2016  . Severe sepsis ([place]) 10/31/2016  . DKA (diabetic ketoacidoses) ([place]) 10/31/2016  . AKI (acute kidney injury) ([place])   . Right hemiparesis ([place]) 06/09/2016  . 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 Cytotoxic brain edema ([place]) 06/08/2016  . Encephalopathy acute 06/06/2016  . Chest pain, musculoskeletal 05/16/2011  . Vaginal candidiasis 05/16/2011  . Hypertension 05/16/2011  . Diabetes mellitus, type 2 ([place]) 05/16/2011  . CHEST PAIN 10/01/2009  . CELLULITIS AND ABSCESS OF LEG EXCEPT FOOT 08/31/2009  . INSOMNIA 07/13/2008  . MRSA 04/17/2008  . NECK MASS 04/17/2008  . COCAINE ABUSE 11/01/2007  . OBESITY NOS 07/14/2006  . DENTAL CARIES 07/14/2006  . DIABETES MELLITUS, TYPE II 04/26/2006  . ALCOHOL ABUSE 04/26/2006  . TOBACCO ABUSE 04/26/2006  . DEPRESSION 04/26/2006  . Essential hypertension 04/26/2006  . GERD 04/26/2006  . LOW BACK PAIN 04/26/2006    Past  Medical History:  Past Medical History:  Diagnosis Date  . Cocaine abuse, continuous   . Diabetes mellitus    Hb A1C = 12.6 on 05/15/11, managed on Novolog 70/30, 35 U qam, 25 U qpm  . Hypertension    poorly controlled  . Insomnia disorder   . Shortness of breath    Past Surgical History:  Past Surgical History:  Procedure Laterality Date  . 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 History taken from chart review. Blood sugar 1200 with lactic acidosis. UDS positive for cocaine. She was treated with fluid boluses as well as IV insulin for HONK and started on broad spectrum antibiotics as septic--one +BC for MRSA. CT head reviewed, negative for acute changes. CT abdomen/pelvis negative for infection.  Neurology consulted due to concerns of seizures due to obtundation and left facial and LUE twitching. She was loaded with keppra and EEG done showing non-specific moderate diffuse slowing. She continued to have fevers and LP showed CSF with 22-24 WBC with neurophilic predominance. ID consulted for input and recommended 7 day treatment of meningoencephalitis.  MRI brain showed small acute right lacunar infarct along globus pallidus--felt to be incidental finding and not cause of MS changes per Dr. Leonel Ramsay and to continue ASA for stroke due to small vessel disease. Carotid dopplers without significant stenosis.  Found to be positive for Hep C as well as incidental finding of  Recommended left parotid neoplasm with ENT follow up recommended. MBS done showing mild to moderate oropharyngeal dysphagia and patient started on dysphagia 3, thin liquids--no straws.   Blood pressure  control as well as acute on chronic renal failure improving. Lethargy has resolve and patient noted to be deconditioned with unsteady staggering gait, decreased awareness of deficits, impulsivity with poor awareness of deficits. CIR recommended due to impairments in ability to carry out ADL tasks and deficits in mobility.   Patient transferred to CIR on 11/06/2016 .    Patient currently requires mod with basic self-care skills secondary to muscle weakness, decreased cardiorespiratoy endurance, decreased visual acuity, decreased attention, decreased awareness, decreased problem solving, decreased safety awareness and decreased memory and decreased standing balance, decreased postural control, hemiplegia, decreased balance strategies and difficulty maintaining precautions.  Prior to hospitalization, patient could complete ADLs and modified IADLs with modified independent .  Patient will benefit from skilled intervention to decrease level of assist with basic self-care skills, increase independence with basic self-care skills and increase level of independence with iADL prior to discharge home with care partner.  Anticipate patient will require intermittent supervision and follow up home health.  OT - End of Session Activity Tolerance: Tolerates 10 - 20 min activity with multiple rests Endurance Deficit: Yes Endurance Deficit Description: Required rest breaks and increased time for all ADLs OT Assessment Rehab Potential (ACUTE ONLY): Good Barriers to Discharge: Decreased caregiver support Barriers to Discharge Comments: Pt's husband is disabled, she provided assist for him PTA OT Patient demonstrates impairments in the following area(s): Balance;Cognition;Endurance;Safety;Pain;Motor;Vision OT Basic ADL's Functional Problem(s): Eating;Grooming;Bathing;Dressing;Toileting OT Advanced ADL's Functional Problem(s): Simple Meal Preparation OT Transfers Functional Problem(s): Toilet;Tub/Shower OT Additional  Impairment(s): None OT Plan OT Intensity: Minimum of 1-2 x/day, 45 to 90 minutes OT Frequency: 5 out of 7 days OT Duration/Estimated Length of Stay: 9-11 days OT Treatment/Interventions: Balance/vestibular training;Cognitive remediation/compensation;Community reintegration;Discharge planning;Disease mangement/prevention;DME/adaptive equipment instruction;Functional mobility training;Neuromuscular re-education;Pain management;Patient/family education;Psychosocial support;Self Care/advanced ADL retraining;Therapeutic Activities;Therapeutic Exercise;UE/LE Strength taining/ROM;UE/LE Coordination activities;Visual/perceptual remediation/compensation OT Self Feeding Anticipated Outcome(s): Supervision OT Basic Self-Care Anticipated Outcome(s): Supervision OT Toileting Anticipated Outcome(s): Supervision OT Bathroom Transfers Anticipated Outcome(s): Supervision OT Recommendation Patient destination: Home Follow Up Recommendations: Home health OT Equipment Recommended: To be determined Equipment Details: Pt has tub bench   Skilled Therapeutic Intervention Pt seen for OT eval and ADL bathing/dressing session. Pt in supine upon arrivla with NT present providing supervision for meal, hand off to OT. Pt declined getting to EOB to eat remainder of meal. She required assist for opening containers as well as max cuing for adhering to swallowing pre-cautions. Pt required multimodal cuing for small sips, with coughing following long sips of water. She transferred to EOB with min A using hospital bed functional and verbal and tactile cuing for sequencing/ technique. She completed stand pivot transfers throughout session with mod A and cues for hand placement on RW/ grab bar.  She showered seated on 3-1 BSC, standing with use of grab bars to complete pericare/ buttock hygiene with steadying assist. She was incontinent of bowel/ bladder in shower.  She transferred out of shower and completed grooming tasks at  sink. Extended seated rest breaks required thorughout session due to decreased activity tolerance. Pt left seated in w/c at end of session, all needs in reach and QRB donned. Educated regarding need to use call bell for mobility assistance.  Pt educated throughout session regarding role of  OT,PT, SLP, POC, CIR, risks for CVA, OT goals, and d/c planning.  OT Evaluation Precautions/Restrictions  Precautions Precautions: Fall Precaution Comments: Bowel/urinary incontinence Restrictions Weight Bearing Restrictions: No General Chart Reviewed: Yes Additional Pertinent History: Hx R CVA Pain Pain Assessment Pain Type: Acute pain Pain Location: Head Pain Descriptors / Indicators: Throbbing Pain Frequency: Constant Pain Onset: On-going Patients Stated Pain Goal: 0 Pain Intervention(s): RN made aware, Medication (See eMAR), shower, increased activity Multiple Pain Sites: No Home Living/Prior [place] expects to be discharged to:: Private residence Living Arrangements: Spouse/significant other Available Help at Discharge: Friend(s), Family, Available PRN/intermittently (Reports neighbor can assist as well as husband's home health aide) Type of Home: House Home Access: Level entry Home Layout: One level Bathroom Shower/Tub: Optometrist: Yes  Lives With: Spouse IADL History Homemaking Responsibilities: Yes Meal Prep Responsibility: Primary Current License: No Mode of Transportation: Cab, Family, Friends Occupation: Retired Prior Function Level of Independence: Independent with transfers, Independent with gait, Independent with homemaking with ambulation, Independent with basic ADLs Driving: No Comments: Takes care of her significant other providing physical assistance for his ADL. Vision/Perception  Vision- Assessment Eye Alignment: Within Functional Limits Additional Comments: Decreased smooth  visual pursuits, did not appear to impair function during eval Perception Perception: Within Functional Limits  Cognition Overall Cognitive Status: Impaired/Different from baseline Arousal/Alertness: Awake/alert Orientation Level: Person;Place;Situation Person: Oriented Place: Oriented Situation: Oriented Year: 2018 Month: May Day of Week: Correct Memory: Impaired Memory Impairment: Storage deficit;Decreased recall of new information Immediate Memory Recall: Sock;Blue;Bed Memory Recall: Blue;Bed Memory Recall Blue: Without Cue Memory Recall Bed: Without Cue Attention: Sustained Focused Attention: Impaired Focused Attention Impairment: Verbal complex;Functional complex Sustained Attention: Impaired Sustained Attention Impairment: Verbal basic;Functional basic Awareness: Impaired Awareness Impairment: Emergent impairment Problem Solving: Impaired Problem Solving Impairment: Functional complex;Verbal complex Reasoning: Impaired Reasoning Impairment: Verbal basic;Functional basic Sequencing: Appears intact Organizing: Appears intact Initiating: Appears intact Safety/Judgment: Impaired Comments: poor adherance to swallowing precautions despite cues; decreased awareness of deficits Sensation Sensation Light Touch: Appears Intact Stereognosis: Appears Intact Hot/Cold: Appears Intact Coordination Gross Motor Movements are Fluid and Coordinated: No Fine Motor Movements are Fluid and Coordinated: No Coordination and Movement Description: Generalized weakness Finger Nose Finger Test: Slowed rate of speed B Motor  Motor Motor: Other (comment) Motor - Skilled Clinical Observations: Generalized weakness Trunk/Postural Assessment  Cervical Assessment Cervical Assessment: Exceptions to [facility] (Forward head) Thoracic Assessment Thoracic Assessment: Exceptions to [facility] (Rounded shoulders) Lumbar Assessment Lumbar Assessment: Exceptions to [facility] (Posterior pelvic tilt) Postural  Control Postural Control: Within Functional Limits  Balance Balance Balance Assessed: Yes Static Sitting Balance Static Sitting - Balance Support: Feet supported Static Sitting - Level of Assistance: 6: Modified independent (Device/Increase time) Static Sitting - Comment/# of Minutes: Sitting EOB Dynamic Sitting Balance Dynamic Sitting - Balance Support: During functional activity;Feet supported Dynamic Sitting - Level of Assistance: 5: Stand by assistance Sitting balance - Comments: Sitting EOB Static Standing Balance Static Standing - Balance Support: During functional activity;Right upper extremity supported;Left upper extremity supported Static Standing - Level of Assistance: 4: Min assist Dynamic Standing Balance Dynamic Standing - Balance Support: During functional activity;Right upper extremity supported;Left upper extremity supported Dynamic Standing - Level of Assistance: 4: Min assist;3: Mod assist Dynamic Standing - Comments: Standing to complete LB bathing/dressing Extremity/Trunk Assessment RUE Assessment RUE Assessment: Within Functional Limits (4/5 strength throughout) LUE Assessment LUE Assessment: Within Functional Limits (4/5 strength throughout)   See Function Navigator for Current Functional Status.  Refer to Care Plan for Long Term Goals  Recommendations for other services: Neuropsych   Discharge Criteria: Patient will be discharged from OT if patient refuses treatment 3 consecutive times without medical reason, if treatment goals not met, if there is a change in medical status, if patient makes no progress towards goals or if patient is discharged from hospital.  The above assessment, treatment plan, treatment alternatives and goals were discussed and mutually agreed upon: by patient  Lewis, Roxann Vierra C 11/07/2016, 11:40 AM

## 2016-11-07 NOTE — Evaluation (Signed)
Bedside Swallow & Speech Language Pathology Assessment and Plan  Patient Details  Name: Chrissy Ealey MRN: [id number] Date of Birth: Aug 09, 1955  SLP Diagnosis: Cognitive Impairments;Dysphagia  Rehab Potential: Good ELOS: 7-9 days     Today's Date: 11/07/2016 SLP Individual Time: 2979-8921 SLP Individual Time Calculation (min): 58 min   Problem List:  Patient Active Problem List   Diagnosis Date Noted  . Neurologic gait disorder   . Neuropathic pain   . Type 2 diabetes mellitus with peripheral neuropathy (HCC)   . History of intracranial hemorrhage   . Diarrhea   . Urinary incontinence   . Hypokalemia   . Polysubstance abuse   . Right sided cerebral hemisphere cerebrovascular accident (CVA) ([place])   . Disorientation   . Meningitis 11/02/2016  . Type 2 diabetes mellitus with hyperosmolar nonketotic hyperglycemia ([place]) 10/31/2016  . Seizure ([place]) 10/31/2016  . ARF (acute renal failure) ([place]) 10/31/2016  . Severe sepsis ([place]) 10/31/2016  . DKA (diabetic ketoacidoses) ([place]) 10/31/2016  . AKI (acute kidney injury) ([place])   . Right hemiparesis ([place]) 06/09/2016  . 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 Cytotoxic brain edema ([place]) 06/08/2016  . Encephalopathy acute 06/06/2016  . Chest pain, musculoskeletal 05/16/2011  . Vaginal candidiasis 05/16/2011  . Hypertension 05/16/2011  . Diabetes mellitus, type 2 ([place]) 05/16/2011  . CHEST PAIN 10/01/2009  . CELLULITIS AND ABSCESS OF LEG EXCEPT FOOT 08/31/2009  . INSOMNIA 07/13/2008  . MRSA 04/17/2008  . NECK MASS 04/17/2008  . COCAINE ABUSE 11/01/2007  . OBESITY NOS 07/14/2006  . DENTAL CARIES 07/14/2006  . DIABETES MELLITUS, TYPE II 04/26/2006  . ALCOHOL ABUSE 04/26/2006  . TOBACCO ABUSE 04/26/2006  . DEPRESSION 04/26/2006  . Essential hypertension 04/26/2006  . GERD 04/26/2006  . LOW BACK PAIN 04/26/2006   Past Medical History:  Past Medical History:  Diagnosis Date  . Cocaine abuse, continuous   . Diabetes mellitus     Hb A1C = 12.6 on 05/15/11, managed on Novolog 70/30, 35 U qam, 25 U qpm  . Hypertension    poorly controlled  . Insomnia disorder   . Shortness of breath    Past Surgical History:  Past Surgical History:  Procedure Laterality Date  . ANKLE FRACTURE SURGERY  2007    Assessment / Plan / Recommendation Clinical Impression Verlinda Ogorthis an 61 y.o.female with hx of T2DM-uncontrolled, HTN, CKD, chronic back pain, tobacco/cocaine abuse, pontine  Hemorrhage 05/2016 with right sided weakness who was admitted on 10/31/16 with lethargy, confusion with inability to follow commands, incontinence and inability to speak. History taken from chart review. Blood sugar 1200 with lactic acidosis. UDS positive for cocaine. She was treated with fluid boluses as well as IV insulin for HONK and started on broad spectrum antibiotics as septic--one +BC for MRSA. CT head reviewed, negative for acute changes. CT abdomen/pelvis negative for infection.  Neurology consulted due to concerns of seizures due to obtundation and left facial and LUE twitching. She was loaded with keppra and EEG done showing non-specific moderate diffuse slowing. She continued to have fevers and LP showed CSF with 22-24 WBC with neurophilic predominance. ID consulted for input and recommended 7 day treatment of meningoencephalitis. MRI brain showed small acute right lacunar infarct along globus pallidus--felt to be incidental finding and not cause of MS changes per Dr. Leonel Ramsay and to continue ASA for stroke due to small vessel disease. Carotid dopplers without significant stenosis.    Found to be positive for Hep C as well as incidental finding of  left parotid neoplasm with ENT follow up recommended. MBS done showing mild to moderate oropharyngeal dysphagia and patient started on dysphagia 3, thin liquids--no straws. Blood pressure control as well as acute on chronic renal failure improving. Lethargy has resolve and patient noted to be  deconditioned with unsteady staggering gait, decreased awareness of deficits, impulsivity with poor awareness of deficits. CIR recommended due to impairments in ability to carry out ADL tasks and deficits in mobility.   Patient was admitted to [place] 11/06/16 and demonstrates moderate cognitive impairments characterized by impaired selective attention, recall, intellectual/emergent awareness, which impact the patient's overall safety with functional problem solving and self-care tasks. Patient also demonstrates impulsivity, which impact her safety with PO.  Patient with documented oropharyngeal dysphagia per MBS 11/04/16 with bedside presentation of intermittent overt reflexive coughs persistent today.  Patient required Max assist verbal cues to recall and utilize consistent throat clears, as a result an external aids was implemented today.  Patient would benefit from skilled SLP intervention in order to maximize her swallow safety and functional independence prior to discharge. Anticipate patient will require 24 hour supervision at home and follow up SLP services.    Skilled Therapeutic Interventions          Skilled treatment initiated with focused on addressing cognition goals. SLP facilitated session by providing implementation of an external aids to assist with recall of safe swallow strategies as well as Mod assist verbal cues for safety with recall of sequence to use of quad cane while walking to and from the bathroom.  Continue with current plan of care.    SLP Assessment  Patient will need skilled Speech Lanaguage Pathology Services during CIR admission    Recommendations  SLP Diet Recommendations: Dysphagia 3 (Mech soft);Thin Liquid Administration via: Cup Medication Administration: Whole meds with puree Supervision: Full supervision/cueing for compensatory strategies Compensations: Slow rate;Small sips/bites;Other (Comment) (throat clear after each sip) Postural Changes  and/or Swallow Maneuvers: Seated upright 90 degrees Oral Care Recommendations: Oral care BID Patient destination: Home Follow up Recommendations: Home Health SLP;24 hour supervision/assistance Equipment Recommended: None recommended by SLP    SLP Frequency 3 to 5 out of 7 days   SLP Duration  SLP Intensity  SLP Treatment/Interventions 7-9 days   Minumum of 1-2 x/day, 30 to 90 minutes  Cognitive remediation/compensation;Cueing hierarchy;Dysphagia/aspiration precaution training;Environmental controls;Functional tasks;Internal/external aids;Medication managment;Patient/family education    Pain Pain Assessment Pain Assessment: No/denies pain  Prior Functioning Cognitive/Linguistic Baseline: Information not available Type of Home: House  Lives With: Spouse Available Help at Discharge: Family;Friend(s);Other (Comment) (level of assist unknown) Education: 9th grade   Function:  Eating Eating   Modified Consistency Diet: No (trials with SLP) Eating Assist Level: Supervision or verbal cues;Set up assist for   Eating Set Up Assist For: Opening containers       Cognition Comprehension Comprehension assist level: Understands basic 75 - 89% of the time/ requires cueing 10 - 24% of the time  Expression   Expression assist level: Expresses basic 75 - 89% of the time/requires cueing 10 - 24% of the time. Needs helper to occlude trach/needs to repeat words.  Social Interaction Social Interaction assist level: Interacts appropriately 90% of the time - Needs monitoring or encouragement for participation or interaction.  Problem Solving Problem solving assist level: Solves basic 50 - 74% of the time/requires cueing 25 - 49% of the time  Memory Memory assist level: Recognizes or recalls 25 - 49% of the time/requires cueing 50 - 75% of  the time   Short Term Goals: Week 1: SLP Short Term Goal 1 (Week 1): short term goals = long term goals   Refer to Care Plan for Long Term  Goals  Recommendations for other services: None   Discharge Criteria: Patient will be discharged from SLP if patient refuses treatment 3 consecutive times without medical reason, if treatment goals not met, if there is a change in medical status, if patient makes no progress towards goals or if patient is discharged from hospital.  The above assessment, treatment plan, treatment alternatives and goals were discussed and mutually agreed upon: by patient  Gunnar Fusi, M.A., CCC-SLP [phone]  [place] 11/07/2016, 4:38 PM

## 2016-11-07 NOTE — Care Management Note (Signed)
Inpatient [facility] Individual Statement of Services  Patient Name:  [name]  Date:  11/07/2016  Welcome to the [facility].  Our goal is to provide you with an individualized program based on your diagnosis and situation, designed to meet your specific needs.  With this comprehensive rehabilitation program, you will be expected to participate in at least 3 hours of rehabilitation therapies Monday-Friday, with modified therapy programming on the weekends.  Your rehabilitation program will include the following services:  Physical Therapy (PT), Occupational Therapy (OT), Speech Therapy (ST), 24 hour per day rehabilitation nursing, Therapeutic Recreaction (TR), Neuropsychology, Case Management (Social Worker), Rehabilitation Medicine, Nutrition Services and Pharmacy Services  Weekly team conferences will be held on Wednesday to discuss your progress.  Your Social Worker will talk with you frequently to get your input and to update you on team discussions.  Team conferences with you and your family in attendance may also be held.  Expected length of stay: 9-11 days  Overall anticipated outcome: supervision with cueing Depending on your progress and recovery, your program may change. Your Social Worker will coordinate services and will keep you informed of any changes. Your Social Worker's name and contact numbers are listed  below.  The following services may also be recommended but are not provided by the [facility] will be made to provide these services after discharge if needed.  Arrangements include referral to agencies that provide these services.  Your insurance has been verified to be:  Medicaid Your primary doctor is:  Nolene Ebbs  Pertinent information will be shared with your doctor and your insurance  company.  Social Worker:  Ovidio Kin, [place] or ([phone]  Information discussed with and copy given to patient by: Elease Hashimoto, 11/07/2016, 10:29 AM

## 2016-11-07 NOTE — Progress Notes (Signed)
Talked with pt daughter, [name]. She has concerns about pt status and the infectious process of her illness. I am relaying to Dr. Julien Nordmann the daughter phone number so he can discuss these these concerns with her. [phone]

## 2016-11-07 NOTE — Evaluation (Signed)
Physical Therapy Assessment and Plan  Patient Details  Name: [name] MRN: [id number] Date of Birth: 08/03/1955  PT Diagnosis: Abnormality of gait, Cognitive deficits, Coordination disorder and Muscle weakness Rehab Potential: Fair ELOS: 7-10 days   Today's Date: 11/07/2016 PT Individual Time: 1300-1415 PT Individual Time Calculation (min): 75 min    Problem List:  Patient Active Problem List   Diagnosis Date Noted  . Neurologic gait disorder   . Neuropathic pain   . Type 2 diabetes mellitus with peripheral neuropathy (HCC)   . History of intracranial hemorrhage   . Diarrhea   . Urinary incontinence   . Hypokalemia   . Polysubstance abuse   . Right sided cerebral hemisphere cerebrovascular accident (CVA) ([place])   . Disorientation   . Meningitis 11/02/2016  . Type 2 diabetes mellitus with hyperosmolar nonketotic hyperglycemia ([place]) 10/31/2016  . Seizure ([place]) 10/31/2016  . ARF (acute renal failure) ([place]) 10/31/2016  . Severe sepsis ([place]) 10/31/2016  . DKA (diabetic ketoacidoses) ([place]) 10/31/2016  . AKI (acute kidney injury) ([place])   . Right hemiparesis ([place]) 06/09/2016  . 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 Cytotoxic brain edema ([place]) 06/08/2016  . Encephalopathy acute 06/06/2016  . Chest pain, musculoskeletal 05/16/2011  . Vaginal candidiasis 05/16/2011  . Hypertension 05/16/2011  . Diabetes mellitus, type 2 ([place]) 05/16/2011  . CHEST PAIN 10/01/2009  . CELLULITIS AND ABSCESS OF LEG EXCEPT FOOT 08/31/2009  . INSOMNIA 07/13/2008  . MRSA 04/17/2008  . NECK MASS 04/17/2008  . COCAINE ABUSE 11/01/2007  . OBESITY NOS 07/14/2006  . DENTAL CARIES 07/14/2006  . DIABETES MELLITUS, TYPE II 04/26/2006  . ALCOHOL ABUSE 04/26/2006  . TOBACCO ABUSE 04/26/2006  . DEPRESSION 04/26/2006  . Essential hypertension 04/26/2006  . GERD 04/26/2006  . LOW BACK PAIN 04/26/2006    Past Medical History:  Past Medical History:  Diagnosis Date  . Cocaine abuse, continuous   .  Diabetes mellitus    Hb A1C = 12.6 on 05/15/11, managed on Novolog 70/30, 35 U qam, 25 U qpm  . Hypertension    poorly controlled  . Insomnia disorder   . Shortness of breath    Past Surgical History:  Past Surgical History:  Procedure Laterality Date  . ANKLE FRACTURE SURGERY  2007    Assessment & Plan Clinical Impression:  A 61 y.o. female admitted on 10/31/2016 for altered mental status and incontinence. Evidence of hyperosmolar nonketotic state with encephalopathy. Also felt to have sepsis and empiric antibiotics were started. Patient also had evidence of acute on chronic renal failure with shock kidney. There was some twitching of the left face, left upper extremity with possible seizure. Seizures were treated with Keppra and felt to be related to severe hyperglycemia. 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 MRI on 11/02/2016 demonstrated globus pallidus. Acute lacunar infarct on the right side. 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 Other workup included lumbar puncture demonstrating pleocytosis started on antibiotics. ID on consult. 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 CIR stay, attending physician, Dr. Naaman Plummer, 12 1 through 06/20/2016, discharged at a level of supervision for transfers, min assist without assisted device for ambulation. Min assist. ADLs. Patient with residual right hemiparesis.   Patient transferred to CIR on 11/06/2016 .   Patient currently requires min with mobility secondary to  muscle weakness, decreased coordination, decreased awareness, decreased problem solving and decreased safety awareness and decreased standing balance, decreased postural control and decreased balance strategies.  Prior to  hospitalization, patient was independent  with mobility and lived with Spouse in a House home.  Home access is  Level entry.  Patient will benefit from skilled PT intervention to maximize safe functional mobility, minimize fall risk and decrease caregiver burden for planned discharge home with 24 hour supervision.  Anticipate patient will benefit from follow up [place] at discharge.  PT - End of Session Activity Tolerance: Tolerates 30+ min activity with multiple rests Endurance Deficit: Yes PT Assessment Rehab Potential (ACUTE/IP ONLY): Fair Barriers to Discharge: Decreased caregiver support PT Patient demonstrates impairments in the following area(s): Balance;Endurance;Motor;Safety PT Transfers Functional Problem(s): Bed Mobility;Bed to Chair;Car;Furniture PT Locomotion Functional Problem(s): Stairs;Wheelchair Mobility;Ambulation PT Plan PT Intensity: Minimum of 1-2 x/day ,45 to 90 minutes PT Frequency: 5 out of 7 days PT Duration Estimated Length of Stay: 7-10 days PT Treatment/Interventions: Ambulation/gait training;Community reintegration;DME/adaptive equipment instruction;Neuromuscular re-education;Psychosocial support;Stair training;UE/LE Strength taining/ROM;UE/LE Coordination activities;Therapeutic Activities;Balance/vestibular training;Discharge planning;Functional mobility training;Cognitive remediation/compensation;Patient/family education;Visual/perceptual remediation/compensation;Splinting/orthotics;Therapeutic Exercise PT Transfers Anticipated Outcome(s): supervision PT Locomotion Anticipated Outcome(s): supervision ambulatory with LRAD PT Recommendation Follow Up Recommendations: Home health PT;24 hour supervision/assistance Patient destination: Home Equipment Recommended: To be determined  Skilled Therapeutic Intervention No c/o pain, but does report fatigue.  PT provided education on PT role, ELOS, goals of therapy, and plan of care.  Pt completes initial evaluation and  instructs pt in transfers and ambulation with no device, RW, and SBQC.  Pt ambulates with no device, min assist for balance with decreased balance strategies with LOB.  Gait with RW with supervision and min cues for walker positioning.  Gait with SBQC and min guard and min cues for use of AD.  PT administered BERG balance scale with results below.  Pt becoming increasingly focused on ill-fitting brief with difficulty attending to other tasks towards end of session.  Returned to room with w/c, positioned upright to await SLP session.   PT Evaluation Precautions/Restrictions Precautions Precautions: Fall Precaution Comments: Bowel/urinary incontinence Restrictions Weight Bearing Restrictions: No Home Living/Prior Functioning Home Living Living Arrangements: Spouse/significant other Available Help at Discharge: Family;Friend(s);Available PRN/intermittently (states her husband's nurse will be able to help her) Type of Home: House Home Access: Level entry Home Layout: One level Bathroom Shower/Tub: Chiropodist: Standard Bathroom Accessibility: Yes  Lives With: Spouse Prior Function Level of Independence: Requires assistive device for independence;Independent with gait;Independent with transfers (used walker outside of the house) Driving: No Comments: Takes care of her significant other providing physical assistance for his ADL. Vision/Perception  Vision - History Baseline Vision: Wears glasses only for reading Patient Visual Report: Blurring of vision Vision - Assessment Eye Alignment: Within Functional Limits Additional Comments: Decreased smooth visual pursuits, did not appear to impair function during eval Perception Perception: Within Functional Limits  Cognition Overall Cognitive Status: Impaired/Different from baseline Arousal/Alertness: Awake/alert Orientation Level: Oriented X4 Attention: Sustained Focused Attention: Appears intact Focused Attention  Impairment: Verbal complex;Functional complex Sustained Attention: Impaired Sustained Attention Impairment: Verbal basic;Functional basic Memory: Impaired Memory Impairment: Storage deficit;Decreased recall of new information Awareness: Impaired Awareness Impairment: Emergent impairment Problem Solving: Impaired Problem Solving Impairment: Functional complex;Verbal complex Reasoning: Impaired Reasoning Impairment: Verbal basic;Functional basic Sequencing: Appears intact Organizing: Appears intact Initiating: Appears intact Safety/Judgment: Impaired Comments: poor adherance to swallowing precautions despite cues; decreased awareness of deficits Sensation Sensation Light Touch: Appears Intact (per pt report) Stereognosis: Appears  Intact Hot/Cold: Appears Intact Coordination Gross Motor Movements are Fluid and Coordinated: No Fine Motor Movements are Fluid and Coordinated: No Coordination and Movement Description: Generalized weakness Finger Nose Finger Test: Slowed rate of speed B Motor  Motor Motor: Other (comment) Motor - Skilled Clinical Observations: Generalized weakness  Mobility Transfers Transfers: Yes Sit to Stand: 4: Min guard Stand to Sit: 4: Min guard Locomotion  Ambulation Ambulation: Yes Ambulation/Gait Assistance: 4: Min assist Ambulation Distance (Feet): 100 Feet Assistive device: Rolling walker;Small based quad cane;None (100' with each) Gait Gait: Yes Gait Pattern: Impaired Gait Pattern: Step-to pattern (increased L<>R weight shift) Gait velocity: decreased Stairs / Additional Locomotion Stairs: Yes Stairs Assistance: 4: Min guard Stair Management Technique: Two rails Number of Stairs: 4 Ramp: 4: Min Administrator Mobility: No  Trunk/Postural Assessment  Cervical Assessment Cervical Assessment: Exceptions to [facility] (Forward head) Thoracic Assessment Thoracic Assessment: Exceptions to [facility] (Rounded shoulders) Lumbar  Assessment Lumbar Assessment: Exceptions to [facility] (Posterior pelvic tilt) Postural Control Postural Control: Within Functional Limits  Balance Balance Balance Assessed: Yes Standardized Balance Assessment Standardized Balance Assessment: Berg Balance Test Berg Balance Test Sit to Stand: Able to stand  independently using hands Standing Unsupported: Able to stand safely 2 minutes Sitting with Back Unsupported but Feet Supported on Floor or Stool: Able to sit safely and securely 2 minutes Stand to Sit: Controls descent by using hands Transfers: Able to transfer safely, definite need of hands Standing Unsupported with Eyes Closed: Able to stand 10 seconds safely Standing Ubsupported with Feet Together: Able to place feet together independently but unable to hold for 30 seconds From Standing, Reach Forward with Outstretched Arm: Can reach forward >12 cm safely (5") From Standing Position, Pick up Object from Floor: Able to pick up shoe, needs supervision From Standing Position, Turn to Look Behind Over each Shoulder: Turn sideways only but maintains balance Turn 360 Degrees: Able to turn 360 degrees safely but slowly Standing Unsupported, Alternately Place Feet on Step/Stool: Able to complete >2 steps/needs minimal assist Standing Unsupported, One Foot in Front: Needs help to step but can hold 15 seconds Standing on One Leg: Unable to try or needs assist to prevent fall Total Score: 35 Static Sitting Balance Static Sitting - Balance Support: Feet supported Static Sitting - Level of Assistance: 6: Modified independent (Device/Increase time) Static Sitting - Comment/# of Minutes: Sitting EOB Dynamic Sitting Balance Dynamic Sitting - Balance Support: During functional activity;Feet supported Dynamic Sitting - Level of Assistance: 5: Stand by assistance Sitting balance - Comments: Sitting EOB Static Standing Balance Static Standing - Balance Support: During functional activity;Right upper  extremity supported;Left upper extremity supported Static Standing - Level of Assistance: 4: Min assist Dynamic Standing Balance Dynamic Standing - Balance Support: During functional activity;Right upper extremity supported;Left upper extremity supported Dynamic Standing - Level of Assistance: 4: Min assist;3: Mod assist Dynamic Standing - Comments: Standing to complete LB bathing/dressing Extremity Assessment  RUE Assessment RUE Assessment: Within Functional Limits (4/5 strength throughout) LUE Assessment LUE Assessment: Within Functional Limits (4/5 strength throughout) RLE Assessment RLE Assessment: Exceptions to [facility] RLE AROM (degrees) RLE Overall AROM Comments: WFL assessed in sitting RLE Strength Right Hip Flexion: 3+/5 Right Knee Flexion: 3+/5 Right Knee Extension: 4+/5 Right Ankle Dorsiflexion: 4+/5 Right Ankle Plantar Flexion: 4+/5 LLE Assessment LLE Assessment: Exceptions to WFL LLE AROM (degrees) LLE Overall AROM Comments: WFL assessed in sitting LLE Strength Left Hip Flexion: 3+/5 Left Knee Flexion: 3+/5 Left Knee Extension: 3+/5 Left Ankle Dorsiflexion: 3+/5  Left Ankle Plantar Flexion: 3+/5   See Function Navigator for Current Functional Status.   Refer to Care Plan for Long Term Goals  Recommendations for other services: None   Discharge Criteria: Patient will be discharged from PT if patient refuses treatment 3 consecutive times without medical reason, if treatment goals not met, if there is a change in medical status, if patient makes no progress towards goals or if patient is discharged from hospital.  The above assessment, treatment plan, treatment alternatives and goals were discussed and mutually agreed upon: by patient  Earnest Conroy Penven-Crew 11/07/2016, 4:47 PM

## 2016-11-07 NOTE — Progress Notes (Signed)
Social Work Assessment and Plan Social Work Assessment and Plan  Patient Details  Name: [name] MRN: [id number] Date of Birth: [birth year]  Today's Date: 11/07/2016  Problem List:  Patient Active Problem List   Diagnosis Date Noted  . Neurologic gait disorder   . Neuropathic pain   . Type 2 diabetes mellitus with peripheral neuropathy (HCC)   . History of intracranial hemorrhage   . Diarrhea   . Urinary incontinence   . Hypokalemia   . Polysubstance abuse   . Right sided cerebral hemisphere cerebrovascular accident (CVA) ([place])   . Disorientation   . Meningitis 11/02/2016  . Type 2 diabetes mellitus with hyperosmolar nonketotic hyperglycemia ([place]) 10/31/2016  . Seizure ([place]) 10/31/2016  . ARF (acute renal failure) ([place]) 10/31/2016  . Severe sepsis ([place]) 10/31/2016  . DKA (diabetic ketoacidoses) ([place]) 10/31/2016  . AKI (acute kidney injury) ([place])   . Right hemiparesis ([place]) 06/09/2016  . 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 Cytotoxic brain edema ([place]) 06/08/2016  . Encephalopathy acute 06/06/2016  . Chest pain, musculoskeletal 05/16/2011  . Vaginal candidiasis 05/16/2011  . Hypertension 05/16/2011  . Diabetes mellitus, type 2 ([place]) 05/16/2011  . CHEST PAIN 10/01/2009  . CELLULITIS AND ABSCESS OF LEG EXCEPT FOOT 08/31/2009  . INSOMNIA 07/13/2008  . MRSA 04/17/2008  . NECK MASS 04/17/2008  . COCAINE ABUSE 11/01/2007  . OBESITY NOS 07/14/2006  . DENTAL CARIES 07/14/2006  . DIABETES MELLITUS, TYPE II 04/26/2006  . ALCOHOL ABUSE 04/26/2006  . TOBACCO ABUSE 04/26/2006  . DEPRESSION 04/26/2006  . Essential hypertension 04/26/2006  . GERD 04/26/2006  . LOW BACK PAIN 04/26/2006   Past Medical History:  Past Medical History:  Diagnosis Date  . Cocaine abuse, continuous   . Diabetes mellitus    Hb A1C = 12.6 on 05/15/11, managed on Novolog 70/30, 35 U qam, 25 U qpm  . Hypertension    poorly controlled  . Insomnia disorder   . Shortness of breath    Past  Surgical History:  Past Surgical History:  Procedure Laterality Date  . ANKLE FRACTURE SURGERY  2007   Social History:  reports that she has been smoking Cigarettes.  She has a 37.50 pack-year smoking history. She has never used smokeless tobacco. She reports that she drinks about 4.8 oz of alcohol per week . She reports that she uses drugs, including Cocaine.  Family / Support Systems Marital Status: Single Patient Roles: Partner, Parent, Caregiver Spouse/Significant Other: [name] [phone] Children: Son in [place] and [place] [phone] Other Supports: [name]-friend [phone] Anticipated Caregiver: Self and [employer] [place] worker Ability/Limitations of Caregiver: [name] is disabled due to cardiac issues and has a Physiological scientist, but is there with pt Caregiver Availability: 24/7 (supervision level only) Family Dynamics: Pt and [name] are close and have been together 35-38 years pt can't remember. They have three children who are supportive. Pt and [name] are there for one another and somehow find a way to make it work and get their needs met.  Social History Preferred language: English Religion: Baptist Cultural Background: No issues Education: [employer] Read: Yes Write: Yes Employment Status: Disabled Freight forwarder Issues: No issues Guardian/Conservator: None-according to MD pt is not fully capable of making her own decisions while here. Will look toward her children since she and [name] are not legally married, if any decisions need to be made while here.   Abuse/Neglect Physical Abuse: Denies Verbal Abuse: Denies Sexual Abuse: Denies Exploitation of patient/patient's resources: Denies Self-Neglect: Denies  Emotional Status Pt's affect, behavior adn adjustment status: Pt is motivated top get home she has no memory of being brought into the hospital, "guess I was too sick. " Pt misses her [name] and wants to see him he  has difficulty coming in due to own health issues. She realizes she needs to get stronger so will not require care at home. Recent Psychosocial Issues: other health issues been in the hospital multiple times in the past few months. Needs to stop her polysubstance issues. Pyschiatric History: History of depression has taken medications in the past and found them helpful. She would benefit from seeing neuro-psych while will make referral for her to be seen while here. She was seen last time she was here in 06/2016 Substance Abuse History: Pt reports she does drink and smoke-tobacco, admits to cocaine use but feels not much. Tox screen was positive when admitted here. Will discuss this issue while here and resources available. Have been over before in Dec.  Patient / Family Perceptions, Expectations & Goals Pt/Family understanding of illness & functional limitations: Pt had to be told reason in the hospital, she reports: " I have an infection and my blood sugar got up to 1200."  [employer] and he is aware of reason pt is here. Will provide information to pt throughout her stay.  Premorbid pt/family roles/activities: Life partner, Parent, Friend, caregiver, etc Anticipated changes in roles/activities/participation: resume Pt/family expectations/goals: Pt states: " I want to go home soon I miss [name] and want to see him."  [name] states: " She needs to stay and get well before coming home."  [employer]: Other (Comment) (Hooked up with [facility]) Premorbid Home Care/DME Agencies: Other (Comment) (had [facility] in 06/2016) Transportation available at discharge: [facility] provides transportation to appointments Resource referrals recommended: Neuropsychology, Support group (specify)  Discharge Planning Living Arrangements: Spouse/significant other Support Systems: Spouse/significant other, Children, Friends/neighbors Type of Residence: Private residence Insurance Resources: [employer]  (specify county) Pensions consultant: SSI, Family Support Financial Screen Referred: No Living Expenses: Education officer, community Management: Significant Other, Patient Does the patient have any problems obtaining your medications?: No Home Management: patient and Physiological scientist Patient/Family Preliminary Plans: Return home with [name] and his Physiological scientist, they all get along well. [facility] worker is there M-F 11:00-1:00 pm. Otherwise pt would assist [name] but he is able to transfer himself and ambulate in the home. Both need to take care of themselves and quit their substance abuse issues. Will await team's evaluations and come up with the best possible and safest discharge plan. Social Work Anticipated Follow Up Needs: HH/OP, Support Group  Clinical Impression Pleasant patient known to CIR from recent admission in 06/2016 for a stroke. She still has right sided residual weakness and was ambulating with a rolling walker at home. She needs to get mod/I or supervision to be able to return  Home with [name] who can only provide supervision due to his own health issues. He is confined to a wheelchair according to pt. Will continue to discuss resources for her substance abuse and have her seen by neuro-psych while here. She wants to go home quickly and will see if feasible, await team's evaluations.  Elease Hashimoto 11/07/2016, 1:37 PM

## 2016-11-08 ENCOUNTER — Inpatient Hospital Stay (HOSPITAL_COMMUNITY): Payer: Medicaid Other | Admitting: Speech Pathology

## 2016-11-08 ENCOUNTER — Inpatient Hospital Stay (HOSPITAL_COMMUNITY): Payer: Medicaid Other | Admitting: Occupational Therapy

## 2016-11-08 ENCOUNTER — Inpatient Hospital Stay (HOSPITAL_COMMUNITY): Payer: Self-pay | Admitting: Physical Therapy

## 2016-11-08 ENCOUNTER — Encounter (HOSPITAL_COMMUNITY): Payer: Self-pay | Admitting: Psychology

## 2016-11-08 ENCOUNTER — Inpatient Hospital Stay (HOSPITAL_COMMUNITY): Payer: Self-pay | Admitting: *Deleted

## 2016-11-08 DIAGNOSIS — R159 Full incontinence of feces: Secondary | ICD-10-CM

## 2016-11-08 LAB — URINE CULTURE: CULTURE: NO GROWTH

## 2016-11-08 LAB — GLUCOSE, CAPILLARY
GLUCOSE-CAPILLARY: 329 mg/dL — AB (ref 65–99)
Glucose-Capillary: 117 mg/dL — ABNORMAL HIGH (ref 65–99)
Glucose-Capillary: 327 mg/dL — ABNORMAL HIGH (ref 65–99)
Glucose-Capillary: 173 mg/dL — ABNORMAL HIGH (ref 65–99)

## 2016-11-08 MED ORDER — LEVETIRACETAM 500 MG PO TABS
500.0000 mg | ORAL_TABLET | Freq: Two times a day (BID) | ORAL | Status: DC
  Administered 2016-11-08 – 2016-11-12 (×8): 500 mg via ORAL
  Filled 2016-11-08 (×8): qty 1

## 2016-11-08 MED ORDER — LISINOPRIL 10 MG PO TABS
10.0000 mg | ORAL_TABLET | Freq: Two times a day (BID) | ORAL | Status: DC
  Administered 2016-11-09 – 2016-11-11 (×6): 10 mg via ORAL
  Filled 2016-11-08 (×6): qty 1

## 2016-11-08 MED ORDER — LINAGLIPTIN 5 MG PO TABS
5.0000 mg | ORAL_TABLET | Freq: Every day | ORAL | Status: DC
  Administered 2016-11-08 – 2016-11-12 (×5): 5 mg via ORAL
  Filled 2016-11-08 (×5): qty 1

## 2016-11-08 NOTE — Consult Note (Signed)
Neuropsychological Consultation   Patient:   [name]   DOB:   Mar 11, 1956  MR Number:  [id number]  Location:  [place] [address] [facility] [address] [id number] [place] [address] Dept: [place]: [phone]           Date of Service:   11/08/2016  Start Time:   2:55 PM End Time:   3:50 PM  Provider/Observer:  Ilean Skill, Psy.D.       Clinical Neuropsychologist       Billing Code/Service: [phone] 4 Units  Chief Complaint:    The patient was referred for psychological consultation due to coping issues and concerns about recent history and descriptions of depressive symptoms, cocaine abuse, and uncontrolled T2D.  The patient had not been taking care of self, which may have been related to substance abuse.  Reason for Service:  Below is the HPI from Medical Chart (Dr. Posey Pronto).    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 History taken from chart review. Blood sugar 1200 with lactic acidosis. UDS positive for cocaine. She was treated with fluid boluses as well as IV insulin for HONK and started on broad spectrum antibiotics as septic--one +BC for MRSA. CT head reviewed, negative for acute changes. CT abdomen/pelvis negative for infection.  Neurology consulted due to concerns of seizures due to obtundation and left facial and LUE twitching. She was loaded with keppra and EEG done showing non-specific moderate diffuse slowing. She continued to have fevers and LP showed CSF with 22-24 WBC with neurophilic predominance. ID consulted for input and recommended 7 day treatment of meningoencephalitis.  MRI brain showed small acute right lacunar infarct along globus  pallidus--felt to be incidental finding and not cause of MS changes per Dr. Leonel Ramsay and to continue ASA for stroke due to small vessel disease. Carotid dopplers without significant stenosis.    Found to be positive for Hep C as well as incidental finding of  Recommended left parotid neoplasm with ENT follow up recommended. MBS done showing mild to moderate oropharyngeal dysphagia and patient started on dysphagia 3, thin liquids--no straws.   Blood pressure control as well as acute on chronic renal failure improving. Lethargy has resolve and patient noted to be deconditioned with unsteady staggering gait, decreased awareness of deficits, impulsivity with poor awareness of deficits. CIR recommended due to impairments in ability to carry out ADL tasks and deficits in mobility.   Patient reports that she was not sure if she was taking her insulin correctly but was still "shocked" to learn that her BGL was 1200.  She did admit to cocaine use and getting from someone in her community.    Current Status:  The patient is still lethargic and reports that her mood continues to be depressed at time but improving.  Reliability of Information: Information from review of medical records, 1 hour face to face with patient, and review of case with treatment team members.  Behavioral Observation: [name]  presents as a 61 y.o.-year-old Right African American Female who appeared her stated age. her dress was Appropriate and she was Well Groomed and her manners were Appropriate to the situation.  her participation was indicative of Drowsy and Sharing behaviors.  There were physical disabilities noted.  she  displayed an appropriate level of cooperation and motivation.     Interactions:    Active Drowsy and Inattentive  Attention:   abnormal and attention span appeared shorter than expected for age  Memory:   abnormal; global memory impairment noted  Visuo-spatial:  not examined  Speech  (Volume):  low  Speech:   slurred;   Thought Process:  Coherent and Circumstantial  Though Content:  WNL; No indication of hallucinations or delusion.    Orientation:   person, place and situation but some disturbace of time.  Judgment:   Poor  Planning:   Poor  Affect:    Blunted and Depressed  Mood:    Depressed  Insight:   Good and Shallow  Intelligence:   low  Substance Use:  There is a documented history of cocaine and tobacco abuse confirmed by the patient and medical records showing positive screen for cocaine..  The patient indicated that this was not the first time she has used cocaine but that she could not afford it all the time and money is the think that has kept her from using more in the past.  Medical History:   Past Medical History:  Diagnosis Date  . Cocaine abuse, continuous   . Diabetes mellitus    Hb A1C = 12.6 on 05/15/11, managed on Novolog 70/30, 35 U qam, 25 U qpm  . Hypertension    poorly controlled  . Insomnia disorder   . Shortness of breath              Psychiatric History:  The patient does indicate that she has had issues with depression in the past.  Family Med/Psych History:  Family History  Problem Relation Age of Onset  . Stroke Father   . Diabetes Sister   . Diabetes Brother     Risk of Suicide/Violence: moderate The patient denies any SI or HI but she has clearly not taken direct care of self and substance use in past has lead to not taking care of her medical issues to a life treatening state.   Impression/DX:  The patient is a 61 year old female with severe mediboloc disorder (T2DM) that acutely rose to life threatening levels (1200 BGL) along with infection and CVA.  Indications of prior CVA likely related to cocaine use in the past.  She indicates that she has been depressed and she clearly had not been taken care of self.    Disposition/Plan:  Will see the patient again on Monday (11/13/2016) to continue working on  psychological interventions we have started.         Electronically Signed   _______________________ Ilean Skill, Psy.D.

## 2016-11-08 NOTE — Patient Care Conference (Signed)
Inpatient RehabilitationTeam Conference and Plan of Care Update Date: 11/08/2016   Time: 10:55 AM    Patient Name: [name]      Medical Record Number: [id number]  Date of Birth: 10-22-1955 Sex: Female         Room/Bed: 4M01C/4M01C-01 Payor Info: Payor: MEDICAID [place] / Plan: MEDICAID [place] ACCESS / Product Type: *No Product type* /    Admitting Diagnosis: R CVA Encephalitis  Admit Date/Time:  11/06/2016  2:19 PM Admission Comments: No comment available   Primary Diagnosis:  <principal problem not specified> Principal Problem: <principal problem not specified>  Patient Active Problem List   Diagnosis Date Noted  . Neurologic gait disorder   . Neuropathic pain   . Type 2 diabetes mellitus with peripheral neuropathy (HCC)   . History of intracranial hemorrhage   . Diarrhea   . Urinary incontinence   . Hypokalemia   . Polysubstance abuse   . Right sided cerebral hemisphere cerebrovascular accident (CVA) ([place])   . Disorientation   . Meningitis 11/02/2016  . Type 2 diabetes mellitus with hyperosmolar nonketotic hyperglycemia ([place]) 10/31/2016  . Seizure ([place]) 10/31/2016  . ARF (acute renal failure) ([place]) 10/31/2016  . Severe sepsis ([place]) 10/31/2016  . DKA (diabetic ketoacidoses) ([place]) 10/31/2016  . AKI (acute kidney injury) ([place])   . Right hemiparesis ([place]) 06/09/2016  . Pontine hemorrhage ([place]) 06/09/2016  . Cytotoxic brain edema ([place]) 06/08/2016  . Encephalopathy acute 06/06/2016  . Chest pain, musculoskeletal 05/16/2011  . Vaginal candidiasis 05/16/2011  . Hypertension 05/16/2011  . Diabetes mellitus, type 2 ([place]) 05/16/2011  . CHEST PAIN 10/01/2009  . CELLULITIS AND ABSCESS OF LEG EXCEPT FOOT 08/31/2009  . INSOMNIA 07/13/2008  . MRSA 04/17/2008  . NECK MASS 04/17/2008  . COCAINE ABUSE 11/01/2007  . OBESITY NOS 07/14/2006  . DENTAL CARIES 07/14/2006  . DIABETES MELLITUS, TYPE II 04/26/2006  . ALCOHOL ABUSE 04/26/2006  . TOBACCO ABUSE 04/26/2006  . DEPRESSION  04/26/2006  . Essential hypertension 04/26/2006  . GERD 04/26/2006  . LOW BACK PAIN 04/26/2006    Expected Discharge Date: Expected Discharge Date: 11/14/16  Team Members Present: Physician leading conference: Dr. Alysia Penna Social Worker Present: Ovidio Kin, LCSW Nurse Present: Other (comment) Haywood Lasso Evans-RN) PT Present: Barrie Folk, PT OT Present: Napoleon Form, OT SLP Present: Stormy Fabian, SLP PPS Coordinator present : Daiva Nakayama, RN, CRRN     Current Status/Progress Goal Weekly Team Focus  Medical   Patient is incontinent of bowel and bladder. She has poor awareness. Some similar symptoms prior to, no evidence of UTI. Needs toileting program  Maintaining medical stability. During rehabilitation stay, encouraged healthy life style choices  Discharge planning   Bowel/Bladder   continent /incontinent at times   toileting more often  maintain continence level all the time   Swallow/Nutrition/ Hydration   Min to Mod A with dysphagia 3 and thin liquids  Supervision  use of compensatory swallow strategies - single sips and throat clear   ADL's   Min A overall  Supervision  ADL re-training, functional cognitive re-training, d/c planning, family training   Mobility   min assist-CGA from PT with gait, and transfers using QC or no AD. Supervision assist with bed mobility.   Supervision assist with LRAD   Balance, satey with transfers, improved  gait    Communication             Safety/Cognition/ Behavioral Observations  Mod A for basic problem solving  Supervision  selective attention, emergent awareness, problem  solving basic problem solving   Pain   no complains of pain today, occasionally headaches controlled by Tylenol  pain free  monitor and adress it as need it   Skin   intact  no breakdown this hospitalization  monitor every shift      *See Care Plan and progress notes for long and short-term goals.  Barriers to Discharge: See above    Possible  Resolutions to Barriers:  See above    Discharge Planning/Teaching Needs:  HOme with [name]-boyfriend who is disabled due to cardiac issues and has a Physiological scientist. Pt needs to be mod/i or supervision since she will not have physical care at home.      Team Discussion:  Goals supervision level due to cognition issues. Needs some cueing at times. Incont B & B and not as concerned about this. Will need to wear depend at home. RN to start timed toileting. Antibiotics finished now. MD adjusting meds for bowels may help incontinence  Revisions to Treatment Plan:  DC 5/8   Continued Need for Acute Rehabilitation Level of Care: The patient requires daily medical management by a physician with specialized training in physical medicine and rehabilitation for the following conditions: Daily direction of a multidisciplinary physical rehabilitation program to ensure safe treatment while eliciting the highest outcome that is of practical value to the patient.: Yes Daily medical management of patient stability for increased activity during participation in an intensive rehabilitation regime.: Yes Daily analysis of laboratory values and/or radiology reports with any subsequent need for medication adjustment of medical intervention for : Neurological problems;Other  Sebastian Dzik, Gardiner Rhyme 11/09/2016, 11:03 AM

## 2016-11-08 NOTE — Progress Notes (Signed)
Physical Therapy Session Note  Patient Details  Name: [name] MRN: [id number] Date of Birth: 1956-02-15  Today's Date: 11/08/2016 PT Individual Time: 0805-0830 AND 1300-1343 PT Individual Time Calculation (min): 25 min AND 43 min   Short Term Goals: Week 1:  PT Short Term Goal 1 (Week 1): =LTGs  Skilled Therapeutic Interventions/Progress Updates:  Session 1 Transfers pt performed sit<>stand with intermittent supervision-min assist throughout treatment and min cues for proper use of UE and increased safety.   Bed mobility including supine<>sit with supervision assist from PT and min cues for LE management and improved technique through log roll.   Gait training completed with Supervision-min assist and QC 176f and 6102f Min cues for proper use of Sound UE for cane use and improved reciprocal movement pattern.   Stairs up down x 12 with BUE support and supervision assist with min cues from PT step to gait pattern and improved safety.   Pt returned to room and performed stand pivot transfer to bed with min assist from PT. Sit>supine completed with supervision assist and left supine in bed with call bell in reach and all needs met.    Session 2.   Pt received supine in bed and agreeable to PT with significant effort from PT. Supine>sit transfer with supervision assist and min cues for safety and technique.   Gait training from room to and from rehab gym with QC, 15052f 2, and min assist for safety and clothing management. Pt continues to be significantly distracted by loose fitting clothes, preventing increased safety with gait.    Standing balance and sustained attention task to play horse shoes. Min-supervision assist from PT throughout with encouragement for continued participation. Pt able to pick up 8 horse shoes from Ground with min assist from PT and return to proper location in rehab gym.  Prolonged rest break following cleaning task due to reported fatigue from pt. Pt  continued to state that she was too tired for any more therapy following horse and cleaning tasks.   Gait back to room as listed above with QC. Toileting with close supervision assist from PT. Able to have continent bladder evacuation on toilet. Returned to bed with supervision assist and self selected Qped technique to return to side lying from standing.   Pt left supine in bed with call bell in reach, bed alarm activated and all needs met.        Therapy Documentation Precautions:  Precautions Precautions: Fall Precaution Comments: Bowel/urinary incontinence Restrictions Weight Bearing Restrictions: No General: PT Amount of Missed Time (min): 17 Minutes (pt refused) Pain:0/10   See Function Navigator for Current Functional Status.   Therapy/Group: Individual Therapy  AusLorie Phenix2/2018, 9:35 AM

## 2016-11-08 NOTE — Progress Notes (Signed)
Speech Language Pathology Daily Session Note  Patient Details  Name: [name] MRN: [id number] Date of Birth: Aug 18, 1955  Today's Date: 11/08/2016 SLP Individual Time: 0830-0930 SLP Individual Time Calculation (min): 60 min  Short Term Goals: Week 1: SLP Short Term Goal 1 (Week 1): short term goals = long term goals   Skilled Therapeutic Interventions: Skilled treatment session focused on cognition and dysphagia goals. SLP facilitated session by providing skilled observation of pt consuming thin liquids via cup. Pt required Min A verbal cues to recall compensatory swallow strategies and Mod A for use of compensatory swallow strategies (single sips and throat clear). Pt required Max A faded to Mod A multimodal cues for completion of simplistic card game. Pt required Max A multimodal cues to place quad cane in left hand. Pt not agreeable and continued to place in her right hand. Pt required to wheelchair, safety belt donned and all needs within reach. Nursing in room. Continue per current plan of care.      Function:  Eating Eating   Modified Consistency Diet: No Eating Assist Level: Supervision or verbal cues           Cognition Comprehension Comprehension assist level: Understands basic 75 - 89% of the time/ requires cueing 10 - 24% of the time  Expression   Expression assist level: Expresses basic 75 - 89% of the time/requires cueing 10 - 24% of the time. Needs helper to occlude trach/needs to repeat words.  Social Interaction Social Interaction assist level: Interacts appropriately 90% of the time - Needs monitoring or encouragement for participation or interaction.  Problem Solving Problem solving assist level: Solves basic 50 - 74% of the time/requires cueing 25 - 49% of the time  Memory Memory assist level: Recognizes or recalls 25 - 49% of the time/requires cueing 50 - 75% of the time    Pain    Therapy/Group: Individual Therapy   Sarahy Creedon B. Rutherford Nail, M.S.,  Rafael Gonzalez 11/08/2016, 10:32 AM

## 2016-11-08 NOTE — Progress Notes (Signed)
Subjective/Complaints: Incont bowel and bladder while sleeping, pt states this happened occ at home, discussed that pattern is early am and that pt may need to awaken early for toileting to avoid this ROS:  Denies CP,SOB, N/V/D Objective: Vital Signs: Blood pressure 121/61, pulse 84, temperature 98.5 F (36.9 C), temperature source Oral, resp. rate 18, height '5\' 8"'$  (1.727 m), weight 97.8 kg (215 lb 11.2 oz), SpO2 99 %. No results found. Results for orders placed or performed during the hospital encounter of 11/06/16 (from the past 72 hour(s))  Glucose, capillary     Status: Abnormal   Collection Time: 11/06/16  4:43 PM  Result Value Ref Range   Glucose-Capillary 322 (H) 65 - 99 mg/dL  HCV RNA quant     Status: None   Collection Time: 11/06/16  7:09 PM  Result Value Ref Range   HCV Quantitative 1,550,000 >50 IU/mL   HCV Quantitative Log 6.190 >1.70 log10 IU/mL   Test Information Comment     Comment: (NOTE) The quantitative range of this assay is 15 IU/mL to 100 million IU/mL. Performed At: [facility] [place], [place] [id number] Lindon Romp MD QJ:[id number]   Glucose, capillary     Status: Abnormal   Collection Time: 11/06/16  9:56 PM  Result Value Ref Range   Glucose-Capillary 168 (H) 65 - 99 mg/dL  Glucose, capillary     Status: Abnormal   Collection Time: 11/07/16  5:49 AM  Result Value Ref Range   Glucose-Capillary 181 (H) 65 - 99 mg/dL  Glucose, capillary     Status: Abnormal   Collection Time: 11/07/16  6:43 AM  Result Value Ref Range   Glucose-Capillary 189 (H) 65 - 99 mg/dL  CBC WITH DIFFERENTIAL     Status: None   Collection Time: 11/07/16  7:07 AM  Result Value Ref Range   WBC 8.9 4.0 - 10.5 K/uL   RBC 4.12 3.87 - 5.11 MIL/uL   Hemoglobin 12.5 12.0 - 15.0 g/dL   HCT 37.8 36.0 - 46.0 %   MCV 91.7 78.0 - 100.0 fL   MCH 30.3 26.0 - 34.0 pg   MCHC 33.1 30.0 - 36.0 g/dL   RDW 13.8 11.5 - 15.5 %   Platelets 203 150 - 400 K/uL   Neutrophils Relative % 55 %   Neutro Abs 4.8 1.7 - 7.7 K/uL   Lymphocytes Relative 31 %   Lymphs Abs 2.8 0.7 - 4.0 K/uL   Monocytes Relative 11 %   Monocytes Absolute 1.0 0.1 - 1.0 K/uL   Eosinophils Relative 3 %   Eosinophils Absolute 0.3 0.0 - 0.7 K/uL   Basophils Relative 0 %   Basophils Absolute 0.0 0.0 - 0.1 K/uL  Comprehensive metabolic panel     Status: Abnormal   Collection Time: 11/07/16  7:07 AM  Result Value Ref Range   Sodium 140 135 - 145 mmol/L   Potassium 4.2 3.5 - 5.1 mmol/L    Comment: HEMOLYSIS AT THIS LEVEL MAY AFFECT RESULT   Chloride 114 (H) 101 - 111 mmol/L   CO2 20 (L) 22 - 32 mmol/L   Glucose, Bld 206 (H) 65 - 99 mg/dL   BUN 6 6 - 20 mg/dL   Creatinine, Ser 1.18 (H) 0.44 - 1.00 mg/dL   Calcium 8.5 (L) 8.9 - 10.3 mg/dL   Total Protein 5.8 (L) 6.5 - 8.1 g/dL   Albumin 2.1 (L) 3.5 - 5.0 g/dL   AST 34 15 - 41 U/L  ALT 20 14 - 54 U/L   Alkaline Phosphatase 38 38 - 126 U/L   Total Bilirubin 0.9 0.3 - 1.2 mg/dL   GFR calc non Af Amer 49 (L) >60 mL/min   GFR calc Af Amer 57 (L) >60 mL/min    Comment: (NOTE) The eGFR has been calculated using the CKD EPI equation. This calculation has not been validated in all clinical situations. eGFR's persistently <60 mL/min signify possible Chronic Kidney Disease.    Anion gap 6 5 - 15  Glucose, capillary     Status: Abnormal   Collection Time: 11/07/16 11:51 AM  Result Value Ref Range   Glucose-Capillary 223 (H) 65 - 99 mg/dL  Urinalysis, Routine w reflex microscopic     Status: Abnormal   Collection Time: 11/07/16  2:56 PM  Result Value Ref Range   Color, Urine STRAW (A) YELLOW   APPearance CLEAR CLEAR   Specific Gravity, Urine 1.011 1.005 - 1.030   pH 6.0 5.0 - 8.0   Glucose, UA >=500 (A) NEGATIVE mg/dL   Hgb urine dipstick SMALL (A) NEGATIVE   Bilirubin Urine NEGATIVE NEGATIVE   Ketones, ur NEGATIVE NEGATIVE mg/dL   Protein, ur 100 (A) NEGATIVE mg/dL   Nitrite NEGATIVE NEGATIVE   Leukocytes, UA NEGATIVE  NEGATIVE   RBC / HPF 0-5 0 - 5 RBC/hpf   WBC, UA 0-5 0 - 5 WBC/hpf   Bacteria, UA NONE SEEN NONE SEEN   Squamous Epithelial / LPF 0-5 (A) NONE SEEN  Glucose, capillary     Status: Abnormal   Collection Time: 11/07/16  4:43 PM  Result Value Ref Range   Glucose-Capillary 332 (H) 65 - 99 mg/dL  Glucose, capillary     Status: Abnormal   Collection Time: 11/07/16  8:52 PM  Result Value Ref Range   Glucose-Capillary 216 (H) 65 - 99 mg/dL  Glucose, capillary     Status: Abnormal   Collection Time: 11/08/16  6:34 AM  Result Value Ref Range   Glucose-Capillary 117 (H) 65 - 99 mg/dL     HEENT: normal Cardio: RRR and no murmur Resp: CTA B/L and unlabored GI: BS positive and non tender and non distended Extremity:  BS positive and NT, ND Skin:   Intact and Other IV sites ok Neuro: Alert/Oriented, Cranial Nerve II-XII normal, Normal Sensory, Abnormal Motor 4/5 in BUE and BLE, Abnormal FMC Ataxic/ dec FMC and Other Delayed responses Musc/Skel:  Normal and Other no pain with ROM Gen NAD   Assessment/Plan: 1. 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 Physiatrist is providing close team supervision and 24 hour management of active medical problems listed below. Physiatrist and rehab team continue to assess barriers to discharge/monitor patient progress toward functional and medical goals. FIM: Function - Bathing Position: Shower Body parts bathed by patient: Right arm, Right upper leg, Left arm, Left upper leg, Chest, Abdomen, Front perineal area, Buttocks Body parts bathed by helper: Back, Right lower leg, Left lower leg Assist Level: Touching or steadying assistance(Pt > 75%)  Function- Upper Body Dressing/Undressing What is the patient wearing?: Hospital gown Assist Level: Touching or steadying assistance(Pt > 75%) Function - Lower Body Dressing/Undressing What is the  patient wearing?: Non-skid slipper socks, Hospital Gown Position: Wheelchair/chair at sink Non-skid slipper socks- Performed by helper: Don/doff right sock, Don/doff left sock Assist for footwear: Maximal assist  Function - Toileting Toileting steps completed by helper: Adjust clothing after toileting,  Adjust clothing prior to toileting, Performs perineal hygiene Toileting Assistive Devices:  (RW) Assist level:  (Total A following incontinent episode)  Function - Toilet Transfers Toilet transfer assistive device: Bedside commode Assist level to bedside commode (at bedside): Moderate assist (Pt 50 - 74%/lift or lower) Assist level from bedside commode (at bedside): Moderate assist (Pt 50 - 74%/lift or lower)  Function - Chair/bed transfer Chair/bed transfer method: Ambulatory Chair/bed transfer assist level: Touching or steadying assistance (Pt > 75%)  Function - Locomotion: Wheelchair Will patient use wheelchair at discharge?: No Function - Locomotion: Ambulation Assistive device: No device Max distance: 100 Assist level: Touching or steadying assistance (Pt > 75%) Assist level: Touching or steadying assistance (Pt > 75%) Assist level: Touching or steadying assistance (Pt > 75%) Assist level: Touching or steadying assistance (Pt > 75%) Assist level: Touching or steadying assistance (Pt > 75%)  Function - Comprehension Comprehension: Auditory Comprehension assist level: Understands basic 75 - 89% of the time/ requires cueing 10 - 24% of the time  Function - Expression Expression: Verbal Expression assist level: Expresses basic 75 - 89% of the time/requires cueing 10 - 24% of the time. Needs helper to occlude trach/needs to repeat words.  Function - Social Interaction Social Interaction assist level: Interacts appropriately 90% of the time - Needs monitoring or encouragement for participation or interaction.  Function - Problem Solving Problem solving assist level: Solves  basic 50 - 74% of the time/requires cueing 25 - 49% of the time  Function - Memory Memory assist level: Recognizes or recalls 25 - 49% of the time/requires cueing 50 - 75% of the time  Medical Problem List and Plan: 1.  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 CIR PT, OT SLP, Team conference today please see physician documentation under team conference tab, met with team face-to-face to discuss problems,progress, and goals. Formulized individual treatment plan based on medical history, underlying problem and comorbidities. 2.  DVT Prophylaxis/Anticoagulation: Pharmaceutical: Lovenox 3. Pain Management: continue gabapentin for neuropathy.  4. Mood: LCSW to follow for evaluation and support.  5. Neuropsych: This patient is not fully capable of making decisions on her own behalf. 6. Skin/Wound Care: routine pressure relief measures 7. Fluids/Electrolytes/Nutrition: Monitor I/O. Check lytes in am.  8. T2DM with peripheral neuropathy: On lantus bid with SSI. Noncompliant with insulin use--continue to educate on importance of compliance. Monitor BS ac/hs  Add gliptin CBG (last 3)   Recent Labs  11/07/16 1643 11/07/16 2052 11/08/16 0634  GLUCAP 332* 216* 117*    9. Chronic insomnia: Will try tylenol pm (used Advil pm at home) 10. H/o ICH: On keppra 1000 mg bid. 11. HTN: monitor BP bid. Continue Norvasc, Metoprolol and Lisinopril.  12. Meningoencephalitis: Vancomycin/ceftriaxone--completed, D/C IV 13. Diarrhea: will check stool for c diff.  14. Bladder incontinence: Check PVRs.  UA neg , oxybutnin qhs 15. Hypokalemia: Continue supplement.  16. Acute on chronic renal failure: Resolving -creat still mildly elevated would not restart metformin until this is normal 17. Polysubstance abuse: Counsel when appropriate.Neuropsych to eval 18. CVA: Cont meds 19.  Loose stools receives glycolax at 1500 will d/c LOS (Days) 2 A FACE TO FACE  EVALUATION WAS PERFORMED  Khamila Bassinger E 11/08/2016, 7:25 AM

## 2016-11-08 NOTE — Progress Notes (Signed)
Occupational Therapy Session Note  Patient Details  Name: [name] MRN: [id number] Date of Birth: [birth year]  Today's Date: 11/08/2016 OT Individual Time: 1100-1155 OT Individual Time Calculation (min): 55 min    Short Term Goals: Week 1:  OT Short Term Goal 1 (Week 1): STG=LTG due to LOS  Skilled Therapeutic Interventions/Progress Updates:    Pt seen for OT ADL bathing/dressing session. Pt sitting up in w/c upon arrival complaints of fatigue and headache. RN made aware and medication administered. With encouragement, pt agreeable to attempt therapy session. She ambulated throughout session with SPC instructed pt for use of SBQC with L UE, however, pt would quickly switch back to using in R UE or leave cane behind altogether.  With encouragement to use toilet for timed toileting, pt able to be continent of urine on toilet. She bathed seated on 3-1 BSC with increased time and cuing for sequencing/ attention to all areas. She returned to w/c to don hospital gown and socks. Rest breaks required throughout and VCs/ demonstration for modified techniques as pt voiced hip tightness when obtaining figure four positioning.  She declined standing grooming tasks, opting to sit in w/c to brush hair. Pt left seated in w/c at end of session, encouraged to stay in w/c until after lunch. QRB donned and NT entering room.   Therapy Documentation Precautions:  Precautions Precautions: Fall Precaution Comments: Bowel/urinary incontinence Restrictions Weight Bearing Restrictions: No P See Function Navigator for Current Functional Status.   Therapy/Group: Individual Therapy  Lewis, Harland Aguiniga C 11/08/2016, 7:15 AM

## 2016-11-09 ENCOUNTER — Inpatient Hospital Stay (HOSPITAL_COMMUNITY): Payer: Self-pay | Admitting: Occupational Therapy

## 2016-11-09 ENCOUNTER — Inpatient Hospital Stay (HOSPITAL_COMMUNITY): Payer: Medicaid Other | Admitting: Speech Pathology

## 2016-11-09 ENCOUNTER — Inpatient Hospital Stay (HOSPITAL_COMMUNITY): Payer: Self-pay | Admitting: Physical Therapy

## 2016-11-09 DIAGNOSIS — B1920 Unspecified viral hepatitis C without hepatic coma: Secondary | ICD-10-CM

## 2016-11-09 LAB — GLUCOSE, CAPILLARY
GLUCOSE-CAPILLARY: 165 mg/dL — AB (ref 65–99)
Glucose-Capillary: 297 mg/dL — ABNORMAL HIGH (ref 65–99)
Glucose-Capillary: 247 mg/dL — ABNORMAL HIGH (ref 65–99)
Glucose-Capillary: 199 mg/dL — ABNORMAL HIGH (ref 65–99)

## 2016-11-09 MED ORDER — INSULIN ASPART 100 UNIT/ML ~~LOC~~ SOLN
5.0000 [IU] | Freq: Three times a day (TID) | SUBCUTANEOUS | Status: DC
  Administered 2016-11-09 – 2016-11-10 (×4): 5 [IU] via SUBCUTANEOUS

## 2016-11-09 MED ORDER — OXYBUTYNIN CHLORIDE ER 5 MG PO TB24
5.0000 mg | ORAL_TABLET | Freq: Every day | ORAL | Status: DC
  Administered 2016-11-09 – 2016-11-11 (×3): 5 mg via ORAL
  Filled 2016-11-09 (×3): qty 1

## 2016-11-09 NOTE — Progress Notes (Signed)
Speech Language Pathology Daily Session Note  Patient Details  Name: [name] MRN: [id number] Date of Birth: 06/12/1956  Today's Date: 11/09/2016 SLP Individual Time: 1300-1400 SLP Individual Time Calculation (min): 60 min  Short Term Goals: Week 1: SLP Short Term Goal 1 (Week 1): short term goals = long term goals   Skilled Therapeutic Interventions:Skilled treatment session focused on dysphagia and cognition goals. SLP facilitated session by providing skilled observation of pt consuming trial tray of regular diet with thin liquids. Pt able to recall and use compensatory swallow strategies with Mod I. Pt able to sustain attention to task with Min A cues d/t internal distractions of toileting and going home for ~ 35 minutes. Pt able to solve basic problems with supervision cues. Pt was left in bed, bed alarm on and all needs within reach. Continue per current plan of care.      Function:  Eating Eating   Modified Consistency Diet: No Eating Assist Level:  (d/t fluctuating attention, bathroom and fixation on going home)           Cognition Comprehension Comprehension assist level: Follows basic conversation/direction with extra time/assistive device  Expression   Expression assist level: Expresses basic needs/ideas: With no assist  Social Interaction Social Interaction assist level: Interacts appropriately 90% of the time - Needs monitoring or encouragement for participation or interaction.  Problem Solving Problem solving assist level: Solves basic problems with no assist  Memory Memory assist level: Recognizes or recalls 75 - 89% of the time/requires cueing 10 - 24% of the time    Pain    Therapy/Group: Individual Therapy  Idonia Zollinger B. Rutherford Nail, M.S., [place] 11/09/2016, 2:00 PM

## 2016-11-09 NOTE — Progress Notes (Signed)
Contacted Dr. Megan Salon for input on Hep C results. He recommends follow up in office in 3-4 weeks (after patient recovers from infection) to discuss treatment options. Their office will set follow up appointment

## 2016-11-09 NOTE — Progress Notes (Signed)
Social Work Patient ID: [name], female   DOB: 06-Oct-1955, 62 y.o.   MRN: [id number]  Spoke with pt and daughter-[name] to inform of team conference goals supervision level and target discharge date 5/8. Pt is very excited to go home she misses jerome and being at home. Her daughter is glad she is doing well and making progress in therapies. Will work on transportation home and if any equipment needs.

## 2016-11-09 NOTE — IPOC Note (Signed)
Overall Plan of Care [facility]) Patient Details Name: [name] MRN: [id number] DOB: October 21, 1955  Admitting Diagnosis: R CVA Encephalitis  Hospital Problems: Active Problems:   Meningitis     Functional Problem List: Nursing Bladder  PT Balance, Endurance, Motor, Safety  OT Balance, Cognition, Endurance, Safety, Pain, Motor, Vision  SLP Cognition, Nutrition  TR         Basic ADL's: OT Eating, Grooming, Bathing, Dressing, Toileting     Advanced  ADL's: OT Simple Meal Preparation     Transfers: PT Bed Mobility, Bed to Chair, Car, Manufacturing systems engineer, Metallurgist: PT Stairs, Emergency planning/management officer, Ambulation     Additional Impairments: OT None  SLP Swallowing, Social Cognition   Problem Solving, Memory, Attention, Awareness  TR      Anticipated Outcomes Item Anticipated Outcome  Self Feeding Supervision  Swallowing  Supervision    Basic self-care  Supervision  Toileting  Supervision   Bathroom Transfers Supervision  Bowel/Bladder  Bladder program, regain cont. assist pt with toileting needs.   Transfers  supervision  Locomotion  supervision ambulatory with LRAD  Communication     Cognition  Supervison   Pain  no pain.   Safety/Judgment  increase safety awareness, no falls and use call light system appropriately    Therapy Plan: PT Intensity: Minimum of 1-2 x/day ,45 to 90 minutes PT Frequency: 5 out of 7 days PT Duration Estimated Length of Stay: 7-10 days OT Intensity: Minimum of 1-2 x/day, 45 to 90 minutes OT Frequency: 5 out of 7 days OT Duration/Estimated Length of Stay: 9-11 days SLP Intensity: Minumum of 1-2 x/day, 30 to 90 minutes SLP Frequency: 3 to 5 out of 7 days SLP Duration/Estimated Length of Stay: 7-9 days        Team Interventions: Nursing Interventions Patient/Family Education, Bladder Management, Pain Management, Medication Management, Skin Care/Wound Management, Cognitive Remediation/Compensation, Psychosocial  Support  PT interventions Ambulation/gait training, Community reintegration, DME/adaptive equipment instruction, Neuromuscular re-education, Psychosocial support, Stair training, UE/LE Strength taining/ROM, UE/LE Coordination activities, Therapeutic Activities, Training and development officer, Discharge planning, Functional mobility training, Cognitive remediation/compensation, Patient/family education, Visual/perceptual remediation/compensation, Splinting/orthotics, Therapeutic Exercise  OT Interventions Training and development officer, Cognitive remediation/compensation, Community reintegration, Discharge planning, Disease mangement/prevention, DME/adaptive equipment instruction, Functional mobility training, Neuromuscular re-education, Pain management, Patient/family education, Psychosocial support, Self Care/advanced ADL retraining, Therapeutic Activities, Therapeutic Exercise, UE/LE Strength taining/ROM, UE/LE Coordination activities, Visual/perceptual remediation/compensation  SLP Interventions Cognitive remediation/compensation, Cueing hierarchy, Dysphagia/aspiration precaution training, Environmental controls, Functional tasks, Internal/external aids, Medication managment, Patient/family education  TR Interventions    SW/CM Interventions Discharge Planning, Psychosocial Support, Patient/Family Education    Team Discharge Planning: Destination: PT-Home ,OT- Home , SLP-Home Projected Follow-up: PT-Home health PT, 24 hour supervision/assistance, OT-  Home health OT, SLP-Home Health SLP, 24 hour supervision/assistance Projected Equipment Needs: PT-To be determined, OT- To be determined, SLP-None recommended by SLP Equipment Details: PT- , OT-Pt has tub bench Patient/family involved in discharge planning: PT- Patient,  OT-Patient, SLP-Patient  MD ELOS: 7-10d Medical Rehab Prognosis:  Good Assessment:  61 y.o.female with hx of T2DM-uncontrolled, HTN, CKD, chronic back pain, tobacco/cocaine abuse, 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 History taken from chart review. Blood sugar 1200 with lactic acidosis. UDS positive for cocaine. She was treated with fluid boluses as well as IV insulin for HONK and started on broad spectrum antibiotics as septic--one +BC for MRSA. CT  head reviewed, negative for acute changes. CT abdomen/pelvis negative for infection.  Neurology consulted due to concerns of seizures due to obtundation and left facial and LUE twitching. She was loaded with keppra and EEG done showing non-specific moderate diffuse slowing. She continued to have fevers and LP showed CSF with 22-24 WBC with neurophilic predominance. ID consulted for input and recommended 7 day treatment of meningoencephalitis.  MRI brain showed small acute right lacunar infarct along globus pallidus--felt to be incidental finding and not cause of MS changes per Dr. Leonel Ramsay and to continue ASA for stroke due to small vessel disease. Carotid dopplers without significant stenosis.      Now requiring 24/7 Rehab RN,MD, as well as CIR level PT, OT and SLP.  Treatment team will focus on ADLs and mobility with goals set at Sup See Team Conference Notes for weekly updates to the plan of care

## 2016-11-09 NOTE — Progress Notes (Addendum)
Subjective/Complaints: Discussed GI vs consult for Hepatitis, will also need ID f/u for meningitis as Outpt ROS:  Denies CP,SOB, N/V/D Objective: Vital Signs: Blood pressure 136/74, pulse 76, temperature 98.6 F (37 C), temperature source Oral, resp. rate 17, height _0  (1.727 m), weight 98.5 kg (217 lb 1.6 oz), SpO2 99 %. No results found. Results for orders placed or performed during the hospital encounter of 11/06/16 (from the past 72 hour(s))  Glucose, capillary     Status: Abnormal   Collection Time: 11/06/16  4:43 PM  Result Value Ref Range   Glucose-Capillary 322 (H) 65 - 99 mg/dL  HCV RNA quant     Status: None   Collection Time: 11/06/16  7:09 PM  Result Value Ref Range   HCV Quantitative 1,550,000 >50 IU/mL   HCV Quantitative Log 6.190 >1.70 log10 IU/mL   Test Information Comment     Comment: (NOTE) The quantitative range of this assay is 15 IU/mL to 100 million IU/mL. Performed At: [facility] [place], [place] [id number] Lindon Romp MD VO:[id number]   Glucose, capillary     Status: Abnormal   Collection Time: 11/06/16  9:56 PM  Result Value Ref Range   Glucose-Capillary 168 (H) 65 - 99 mg/dL  Glucose, capillary     Status: Abnormal   Collection Time: 11/07/16  5:49 AM  Result Value Ref Range   Glucose-Capillary 181 (H) 65 - 99 mg/dL  Glucose, capillary     Status: Abnormal   Collection Time: 11/07/16  6:43 AM  Result Value Ref Range   Glucose-Capillary 189 (H) 65 - 99 mg/dL  CBC WITH DIFFERENTIAL     Status: None   Collection Time: 11/07/16  7:07 AM  Result Value Ref Range   WBC 8.9 4.0 - 10.5 K/uL   RBC 4.12 3.87 - 5.11 MIL/uL   Hemoglobin 12.5 12.0 - 15.0 g/dL   HCT 37.8 36.0 - 46.0 %   MCV 91.7 78.0 - 100.0 fL   MCH 30.3 26.0 - 34.0 pg   MCHC 33.1 30.0 - 36.0 g/dL   RDW 13.8 11.5 - 15.5 %   Platelets 203 150 - 400 K/uL   Neutrophils Relative % 55 %   Neutro Abs 4.8 1.7 - 7.7 K/uL   Lymphocytes Relative 31 %   Lymphs  Abs 2.8 0.7 - 4.0 K/uL   Monocytes Relative 11 %   Monocytes Absolute 1.0 0.1 - 1.0 K/uL   Eosinophils Relative 3 %   Eosinophils Absolute 0.3 0.0 - 0.7 K/uL   Basophils Relative 0 %   Basophils Absolute 0.0 0.0 - 0.1 K/uL  Comprehensive metabolic panel     Status: Abnormal   Collection Time: 11/07/16  7:07 AM  Result Value Ref Range   Sodium 140 135 - 145 mmol/L   Potassium 4.2 3.5 - 5.1 mmol/L    Comment: HEMOLYSIS AT THIS LEVEL MAY AFFECT RESULT   Chloride 114 (H) 101 - 111 mmol/L   CO2 20 (L) 22 - 32 mmol/L   Glucose, Bld 206 (H) 65 - 99 mg/dL   BUN 6 6 - 20 mg/dL   Creatinine, Ser 1.18 (H) 0.44 - 1.00 mg/dL   Calcium 8.5 (L) 8.9 - 10.3 mg/dL   Total Protein 5.8 (L) 6.5 - 8.1 g/dL   Albumin 2.1 (L) 3.5 - 5.0 g/dL   AST 34 15 - 41 U/L   ALT 20 14 - 54 U/L   Alkaline Phosphatase 38 38 - 126  U/L   Total Bilirubin 0.9 0.3 - 1.2 mg/dL   GFR calc non Af Amer 49 (L) >60 mL/min   GFR calc Af Amer 57 (L) >60 mL/min    Comment: (NOTE) The eGFR has been calculated using the CKD EPI equation. This calculation has not been validated in all clinical situations. eGFR's persistently <60 mL/min signify possible Chronic Kidney Disease.    Anion gap 6 5 - 15  Glucose, capillary     Status: Abnormal   Collection Time: 11/07/16 11:51 AM  Result Value Ref Range   Glucose-Capillary 223 (H) 65 - 99 mg/dL  Urinalysis, Routine w reflex microscopic     Status: Abnormal   Collection Time: 11/07/16  2:56 PM  Result Value Ref Range   Color, Urine STRAW (A) YELLOW   APPearance CLEAR CLEAR   Specific Gravity, Urine 1.011 1.005 - 1.030   pH 6.0 5.0 - 8.0   Glucose, UA >=500 (A) NEGATIVE mg/dL   Hgb urine dipstick SMALL (A) NEGATIVE   Bilirubin Urine NEGATIVE NEGATIVE   Ketones, ur NEGATIVE NEGATIVE mg/dL   Protein, ur 728 (A) NEGATIVE mg/dL   Nitrite NEGATIVE NEGATIVE   Leukocytes, UA NEGATIVE NEGATIVE   RBC / HPF 0-5 0 - 5 RBC/hpf   WBC, UA 0-5 0 - 5 WBC/hpf   Bacteria, UA NONE SEEN NONE  SEEN   Squamous Epithelial / LPF 0-5 (A) NONE SEEN  Urine culture     Status: None   Collection Time: 11/07/16  2:56 PM  Result Value Ref Range   Specimen Description URINE, CATHETERIZED    Special Requests NONE    Culture NO GROWTH    Report Status 11/08/2016 FINAL   Glucose, capillary     Status: Abnormal   Collection Time: 11/07/16  4:43 PM  Result Value Ref Range   Glucose-Capillary 332 (H) 65 - 99 mg/dL  Glucose, capillary     Status: Abnormal   Collection Time: 11/07/16  8:52 PM  Result Value Ref Range   Glucose-Capillary 216 (H) 65 - 99 mg/dL  Glucose, capillary     Status: Abnormal   Collection Time: 11/08/16  6:34 AM  Result Value Ref Range   Glucose-Capillary 117 (H) 65 - 99 mg/dL  Glucose, capillary     Status: Abnormal   Collection Time: 11/08/16 11:55 AM  Result Value Ref Range   Glucose-Capillary 329 (H) 65 - 99 mg/dL  Glucose, capillary     Status: Abnormal   Collection Time: 11/08/16  4:25 PM  Result Value Ref Range   Glucose-Capillary 327 (H) 65 - 99 mg/dL  Glucose, capillary     Status: Abnormal   Collection Time: 11/08/16  8:34 PM  Result Value Ref Range   Glucose-Capillary 173 (H) 65 - 99 mg/dL     HEENT: normal Cardio: RRR and no murmur Resp: CTA B/L and unlabored GI: BS positive and non tender and non distended Extremity:  BS positive and NT, ND Skin:   Intact and Other IV sites ok Neuro: Alert/Oriented, Cranial Nerve II-XII normal, Normal Sensory, Abnormal Motor 4/5 in BUE and BLE, Abnormal FMC Ataxic/ dec FMC and Other Delayed responses Musc/Skel:  Normal and Other no pain with ROM Gen NAD   Assessment/Plan: 1. 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 Physiatrist is providing close team supervision and 24 hour management of active medical problems listed below. Physiatrist and rehab team continue to assess barriers  to  discharge/monitor patient progress toward functional and medical goals. FIM: Function - Bathing Position: Shower Body parts bathed by patient: Right arm, Right upper leg, Left arm, Left upper leg, Chest, Abdomen, Front perineal area, Buttocks, Right lower leg, Left lower leg, Back Body parts bathed by helper: Back, Right lower leg, Left lower leg Assist Level: Supervision or verbal cues  Function- Upper Body Dressing/Undressing What is the patient wearing?: Hospital gown Assist Level: Touching or steadying assistance(Pt > 75%) Function - Lower Body Dressing/Undressing What is the patient wearing?: Underwear, Non-skid slipper socks Position: Wheelchair/chair at sink Underwear - Performed by patient: Thread/unthread right underwear leg, Thread/unthread left underwear leg, Pull underwear up/down Non-skid slipper socks- Performed by patient: Don/doff right sock, Don/doff left sock Non-skid slipper socks- Performed by helper: Don/doff right sock, Don/doff left sock Assist for footwear: Supervision/touching assist Assist for lower body dressing: Touching or steadying assistance (Pt > 75%)  Function - Toileting Toileting steps completed by patient: Adjust clothing prior to toileting, Performs perineal hygiene, Adjust clothing after toileting Toileting steps completed by helper: Adjust clothing after toileting, Adjust clothing prior to toileting, Performs perineal hygiene Toileting Assistive Devices:  (RW) Assist level: Touching or steadying assistance (Pt.75%)  Function - Air cabin crew transfer assistive device: Grab bar, Cane Assist level to toilet: Touching or steadying assistance (Pt > 75%) Assist level from toilet: Touching or steadying assistance (Pt > 75%) Assist level to bedside commode (at bedside): Moderate assist (Pt 50 - 74%/lift or lower) Assist level from bedside commode (at bedside): Moderate assist (Pt 50 - 74%/lift or lower)  Function - Chair/bed  transfer Chair/bed transfer method: Stand pivot Chair/bed transfer assist level: Touching or steadying assistance (Pt > 75%)  Function - Locomotion: Wheelchair Will patient use wheelchair at discharge?: No Function - Locomotion: Ambulation Assistive device: Cane-quad Max distance: 165f  Assist level: Touching or steadying assistance (Pt > 75%) Assist level: Supervision or verbal cues Assist level: Touching or steadying assistance (Pt > 75%) Assist level: Touching or steadying assistance (Pt > 75%) Assist level: Touching or steadying assistance (Pt > 75%)  Function - Comprehension Comprehension: Auditory Comprehension assist level: Understands basic 75 - 89% of the time/ requires cueing 10 - 24% of the time  Function - Expression Expression: Verbal Expression assist level: Expresses basic 75 - 89% of the time/requires cueing 10 - 24% of the time. Needs helper to occlude trach/needs to repeat words.  Function - Social Interaction Social Interaction assist level: Interacts appropriately 90% of the time - Needs monitoring or encouragement for participation or interaction.  Function - Problem Solving Problem solving assist level: Solves basic 50 - 74% of the time/requires cueing 25 - 49% of the time  Function - Memory Memory assist level: Recognizes or recalls 25 - 49% of the time/requires cueing 50 - 75% of the time Patient normally able to recall (first 3 days only): That he or she is in a hospital  Medical Problem List and Plan: 1.  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 CIR PT, OT SLP, discussed d/c date     , 2.  DVT Prophylaxis/Anticoagulation: Pharmaceutical: Lovenox 3. Pain Management: continue gabapentin for neuropathy.  4. Mood: LCSW to follow for evaluation and support.  5. Neuropsych: This patient is not fully capable of making decisions on her own behalf. 6. Skin/Wound Care: routine pressure relief measures 7.  Fluids/Electrolytes/Nutrition: Monitor I/O. Check lytes in am.  8. T2DM with peripheral neuropathy: On lantus bid with SSI. Noncompliant  with insulin use--continue to educate on importance of compliance. Monitor BS ac/hs  Add gliptin CBG (last 3)   Recent Labs  11/08/16 1155 11/08/16 1625 11/08/16 2034  GLUCAP 329* 327* 173*    9. Chronic insomnia: Will try tylenol pm (used Advil pm at home) 10. H/o ICH: On keppra 1000 mg bid. 11. HTN: monitor BP bid. Continue Norvasc, Metoprolol and Lisinopril.  12. Meningoencephalitis: Vancomycin/ceftriaxone--completed, D/C IV 13. Diarrhea: will check stool for c diff.  14. Bladder incontinence:  UA neg , oxybutnin qhs to start tonite 15. Hypokalemia: Continue supplement.  16. Acute on chronic renal failure: Resolving -creat still mildly elevated would not restart metformin until this is normal 17. Polysubstance abuse: Counsel when appropriate.Neuropsych note appreciated 18. CVA: Cont meds 19.  Loose stools off glycolax  20.  Hep C  ID vs GI to eval LOS (Days) 3 A FACE TO FACE EVALUATION WAS PERFORMED  Quynh Basso E 11/09/2016, 7:31 AM

## 2016-11-09 NOTE — Progress Notes (Signed)
Physical Therapy Session Note  Patient Details  Name: [name] MRN: [id number] Date of Birth: 1956/07/07  Today's Date: 11/09/2016 PT Individual Time: 1400-1515 PT Individual Time Calculation (min): 75 min   Short Term Goals: Week 1:  PT Short Term Goal 1 (Week 1): =LTGs  Skilled Therapeutic Interventions/Progress Updates:     Bed mobility  To come sitting EOB with supervision assist from PT. Pt required maximum encouragement to participate in rehab on this day stating that shejust wants to go home. PT provided education for need to continued physical activity to improve safety of mobility at home.   Gait in various environments with QC and supervision assist from PT throughout. Gait  Training included 29f on unlevel cement sidewalk as well as 1846fthrough hospital gift shop. Pt demonstrated functional reaching tasks outside BOS while in gift shop and negotiated through various obstacles and crowded environment, no LOB noted.   Sustained attention and problem solving tasks to play Connect four. PT was required to provide moderate cues for anticipatory reasoning with mild improvement throughout 4 rounds of game.   Pt returned to room and performed ambulatory transfer to bed with supervision assist . Sit>supine completed with supervision assist and left supine in bed with call bell in reach and all needs met.       Therapy Documentation Precautions:  Precautions Precautions: Fall Precaution Comments: Bowel/urinary incontinence Restrictions Weight Bearing Restrictions: No    Pain: 0/10   See Function Navigator for Current Functional Status.   Therapy/Group: Individual Therapy  AuLorie Phenix/09/2016, 3:15 PM

## 2016-11-09 NOTE — Progress Notes (Signed)
Occupational Therapy Session Note  Patient Details  Name: [name] MRN: [id number] Date of Birth: [birth year]  Today's Date: 11/09/2016 OT Individual Time: 5093-2671 OT Individual Time Calculation (min): 55 min  and Today's Date: 11/09/2016 OT Missed Time: 20 Minutes Missed Time Reason: Patient unwilling/refused to participate without medical reason   Short Term Goals: Week 1:  OT Short Term Goal 1 (Week 1): STG=LTG due to LOS  Skilled Therapeutic Interventions/Progress Updates:    Pt seen for OT session focusing on ADL re-training and participation. Pt in supine upon arrival, denied pain. Throughout session, she required significantly increased time and encouragement for all tasks and participation. Assisted with problem solving use of hospital phone to make phone call to brother  To ask for clothing.  With extensive education and encouragement regarding use of timed toileting. Pt reluctantly willing to go to bathroom. Upon turning onto toilet pt with immediate void of urine.  She completed ambulation to sink for grooming tasks.  Following seated rest break and more encouragement and education regarding OT/PT goals and items needed to be address prior to d/c. She self propelled w/c using B LEs to ADL apartment. She ambulated into kitchen to retrieve drink from refrigerator with supervision. While ambulating she was perseverating on ill fitting brief and thus refused remainder of session until she got clothing. Pt willing to don paper scrubs and therefore returned to pt's room. She dressed UB/LB with supervision. She cont to refuse therapy after changing clothes and stated "I'm getting very agitated with you all". Agreed to let pt rest, though educated regarding importance of participation.  Pt left seated in w/c at end of session, QRB donned, and RN made aware of pt's behaviors.  Initially attempted to force use of cane, however, pt would leave it behind/ not attend to. She was able to  ambulate with close supervision without AD, therefore session completed without AD>   Therapy Documentation Precautions:  Precautions Precautions: Fall Precaution Comments: Bowel/urinary incontinence Restrictions Weight Bearing Restrictions: No Pain:   No/ denies pain  See Function Navigator for Current Functional Status.   Therapy/Group: Individual Therapy  Lewis, Royston Bekele C 11/09/2016, 7:10 AM

## 2016-11-10 ENCOUNTER — Inpatient Hospital Stay (HOSPITAL_COMMUNITY): Payer: Self-pay | Admitting: Physical Therapy

## 2016-11-10 ENCOUNTER — Inpatient Hospital Stay (HOSPITAL_COMMUNITY): Payer: Medicaid Other | Admitting: Speech Pathology

## 2016-11-10 ENCOUNTER — Inpatient Hospital Stay (HOSPITAL_COMMUNITY): Payer: Medicaid Other | Admitting: Occupational Therapy

## 2016-11-10 DIAGNOSIS — R4189 Other symptoms and signs involving cognitive functions and awareness: Secondary | ICD-10-CM

## 2016-11-10 LAB — VAS US CAROTID
LCCADSYS: -71 cm/s
LCCAPDIAS: 9 cm/s
LCCAPSYS: 73 cm/s
LEFT ECA DIAS: -2 cm/s
LEFT VERTEBRAL DIAS: -13 cm/s
LICADDIAS: -23 cm/s
LICAPDIAS: -21 cm/s
LICAPSYS: -60 cm/s
Left CCA dist dias: -12 cm/s
Left ICA dist sys: -65 cm/s
RCCAPSYS: -86 cm/s
RIGHT ECA DIAS: -3 cm/s
RIGHT VERTEBRAL DIAS: -30 cm/s
Right CCA prox dias: -18 cm/s
Right cca dist sys: -85 cm/s

## 2016-11-10 LAB — GLUCOSE, CAPILLARY
GLUCOSE-CAPILLARY: 80 mg/dL (ref 65–99)
Glucose-Capillary: 310 mg/dL — ABNORMAL HIGH (ref 65–99)
Glucose-Capillary: 68 mg/dL (ref 65–99)
Glucose-Capillary: 78 mg/dL (ref 65–99)

## 2016-11-10 MED ORDER — ATORVASTATIN CALCIUM 40 MG PO TABS: 40.0000 mg | ORAL_TABLET | Freq: Every day | ORAL | 0 refills | Status: DC

## 2016-11-10 MED ORDER — FOLIC ACID 1 MG PO TABS: 1.0000 mg | ORAL_TABLET | Freq: Every day | ORAL | 0 refills | Status: DC

## 2016-11-10 MED ORDER — METOPROLOL TARTRATE 25 MG PO TABS: 25.0000 mg | ORAL_TABLET | Freq: Two times a day (BID) | ORAL | 0 refills | Status: DC

## 2016-11-10 MED ORDER — LISINOPRIL 10 MG PO TABS
10.0000 mg | ORAL_TABLET | Freq: Two times a day (BID) | ORAL | 0 refills | Status: DC
Start: 2016-11-10 — End: 2016-11-12

## 2016-11-10 MED ORDER — INSULIN ASPART PROT & ASPART (70-30 MIX) 100 UNIT/ML ~~LOC~~ SUSP
33.0000 [IU] | Freq: Two times a day (BID) | SUBCUTANEOUS | Status: DC
  Administered 2016-11-10: 33 [IU] via SUBCUTANEOUS
  Filled 2016-11-10: qty 10

## 2016-11-10 MED ORDER — LEVETIRACETAM 500 MG PO TABS: 500.0000 mg | ORAL_TABLET | Freq: Two times a day (BID) | ORAL | 0 refills | Status: DC

## 2016-11-10 MED ORDER — OXYBUTYNIN CHLORIDE ER 5 MG PO TB24: 5.0000 mg | ORAL_TABLET | Freq: Every day | ORAL | 0 refills | Status: DC

## 2016-11-10 MED ORDER — AMLODIPINE BESYLATE 10 MG PO TABS: 10.0000 mg | ORAL_TABLET | Freq: Every day | ORAL | 0 refills | Status: DC

## 2016-11-10 NOTE — Progress Notes (Signed)
Occupational Therapy Session Note  Patient Details  Name: [name] MRN: [id number] Date of Birth: Mar 30, 1956  Today's Date: 11/10/2016 OT Individual Time: 1100-1155 OT Individual Time Calculation (min): 55 min    Short Term Goals: Week 1:  OT Short Term Goal 1 (Week 1): STG=LTG due to LOS  Skilled Therapeutic Interventions/Progress Updates:    Pt seen for OT ADL bathing/dressing session. Pt in supine upon arrival, appeared ammicable, hwoever, quickly refused therapy services stating " I just don't want to do anything today". Educated regarding IPR and OT/PT/SLP goals and need for participation in order to d/c. Pt desiring to leave early. This therapist checked with PT, SLP, and CSW regarding moving up pt's d/c date, CSW to work on this. Upon return to room, pt willing to participate in therapy session. She ambulated throughout room and bathroom with supervision to complete seated showering task. She dressed seated in w/c with increased time.  Encouraged pt to attempt void, despite pt saying she didn't need to go, pt able to have continent void. Educated regarding timed toileting and recommendation for this at d/c to prevent incontinent episodes. Pt returned to w/c at end of session, left seated in w/c with QRB donned and all needs in reach.  Educated throughout session regarding fall risk as well as steps to healthy living to prevent CVA in the future.   Therapy Documentation Precautions:  Precautions Precautions: Fall Precaution Comments: Bowel/urinary incontinence Restrictions Weight Bearing Restrictions: No Pain:   No/ denies pain  See Function Navigator for Current Functional Status.   Therapy/Group: Individual Therapy  Lewis, [name] Barrero C 11/10/2016, 7:12 AM

## 2016-11-10 NOTE — Progress Notes (Signed)
Physical Therapy Discharge Summary  Patient Details  Name: [name] MRN: [id number] Date of Birth: [birth year]  Today's Date: 11/10/2016 PT Individual Time: 1300-1410 PT Individual Time Calculation (min): 70 min    Patient has met 7 of 7 long term goals due to improved activity tolerance, improved balance, increased strength and improved coordination.  Patient to discharge at an ambulatory level Supervision.   Patient's care partner unavailable to provide the necessary cognitive assistance at discharge.  Recommendation:  Patient will benefit from ongoing skilled PT services in home health setting to continue to advance safe functional mobility, address ongoing impairments in balance, ambulation, cognition, safety, and minimize fall risk.  Equipment: No equipment provided  Reasons for discharge: treatment goals met and discharge from hospital  Patient/family agrees with progress made and goals achieved: Yes   PT treatment: PT instructed pt in grad day assessment to measure progress toward goals. See below for detailed results. Pt instructed in berg balance test. Patient demonstrates increased fall risk as noted by score of  46 /56 on Berg Balance Scale.  (<36= high risk for falls, close to 100%; 37-45 significant >80%; 46-51 moderate >50%; 52-55 lower >25%). Gait training also performed in various environments including over uneven cement sidewalk x 248ft with supervision assist from PT. Pt returned to room and left supine in bed with bed alarm activated and all needs met.    PT Discharge Precautions/Restrictions Precautions Precautions: Fall Precaution Comments: Bowel/urinary incontinence Restrictions Weight Bearing Restrictions: No Vital Signs   Pain Pain Assessment Pain Assessment: No/denies pain Pain Score: 0-No pain Vision/Perception  Vision - History Baseline Vision: Wears glasses only for reading Patient Visual Report: Blurring of vision Vision - Assessment Eye  Alignment: Within Functional Limits Perception Perception: Within Functional Limits Praxis Praxis: Intact  Cognition Overall Cognitive Status: No family/caregiver present to determine baseline cognitive functioning Arousal/Alertness: Awake/alert Orientation Level: Oriented X4 Attention: Sustained;Selective Focused Attention: Appears intact Focused Attention Impairment: Functional complex;Verbal complex Sustained Attention: Appears intact Selective Attention: Impaired Selective Attention Impairment: Verbal basic;Functional basic Memory: Impaired Memory Impairment: Storage deficit;Retrieval deficit;Decreased recall of new information Awareness: Impaired Awareness Impairment: Intellectual impairment Problem Solving: Impaired Problem Solving Impairment: Functional complex;Verbal basic Executive Function: Decision Making;Self Monitoring;Self Correcting;Initiating Reasoning: Impaired Reasoning Impairment: Verbal basic;Functional basic Sequencing: Appears intact Sequencing Impairment: Verbal basic;Functional basic Organizing: Impaired Organizing Impairment: Verbal complex;Functional complex Decision Making: Impaired Decision Making Impairment: Functional basic;Functional complex Initiating: Appears intact Initiating Impairment: Verbal basic;Functional basic Self Monitoring: Impaired Self Monitoring Impairment: Verbal basic;Functional basic Self Correcting: Impaired Self Correcting Impairment: Verbal basic;Functional basic Behaviors: Impulsive;Poor frustration tolerance;Restless Safety/Judgment: Appears intact Comments: Safety intact with mobility, however, pt with poor adherence to swallowing precautions and following medical recommendations Sensation Sensation Light Touch: Appears Intact (extinction on the LLE with BLE testing) Stereognosis: Appears Intact Hot/Cold: Appears Intact Proprioception: Appears Intact Coordination Gross Motor Movements are Fluid and Coordinated:  Yes Fine Motor Movements are Fluid and Coordinated: Yes Finger Nose Finger Test: [facility] Motor  Motor Motor: Within Functional Limits  Mobility Bed Mobility Bed Mobility: Rolling Right;Rolling Left;Supine to Sit;Sit to Supine Rolling Right: 6: Modified independent (Device/Increase time) Rolling Left: 6: Modified independent (Device/Increase time) Supine to Sit: 6: Modified independent (Device/Increase time) Sit to Supine: 6: Modified independent (Device/Increase time) Transfers Transfers: Yes Sit to Stand: 6: Modified independent (Device/Increase time) Stand to Sit: 6: Modified independent (Device/Increase time) Stand Pivot Transfers: 5: Supervision Stand Pivot Transfer Details: Verbal cues for precautions/safety;Verbal cues for safe use of DME/AE Locomotion  Ambulation Ambulation: Yes  Ambulation/Gait Assistance: 5: Supervision Ambulation Distance (Feet): 200 Feet Assistive device: Small based quad cane Ambulation/Gait Assistance Details: Verbal cues for safe use of DME/AE;Verbal cues for gait pattern Gait Gait: Yes Gait Pattern: Impaired Gait Pattern: Decreased trunk rotation;Poor foot clearance - left;Poor foot clearance - right Stairs / Additional Locomotion Stairs: Yes Stairs Assistance: 5: Supervision Stairs Assistance Details: Verbal cues for precautions/safety;Verbal cues for safe use of DME/AE Stair Management Technique: Two rails Number of Stairs: 12 Height of Stairs: 6 Wheelchair Mobility Wheelchair Mobility: No  Trunk/Postural Assessment  Cervical Assessment Cervical Assessment: Exceptions to [facility] (Forward head) Thoracic Assessment Thoracic Assessment: Exceptions to [facility] (Rounded shoulders) Lumbar Assessment Lumbar Assessment: Exceptions to [facility] (Posterior pelvic tilt) Postural Control Postural Control: Within Functional Limits  Balance Balance Balance Assessed: Yes Standardized Balance Assessment Standardized Balance Assessment: Berg Balance Test Berg Balance  Test Sit to Stand: Able to stand  independently using hands Standing Unsupported: Able to stand safely 2 minutes Sitting with Back Unsupported but Feet Supported on Floor or Stool: Able to sit safely and securely 2 minutes Stand to Sit: Sits safely with minimal use of hands Transfers: Able to transfer safely, minor use of hands Standing Unsupported with Eyes Closed: Able to stand 10 seconds safely Standing Ubsupported with Feet Together: Able to place feet together independently and stand 1 minute safely From Standing, Reach Forward with Outstretched Arm: Can reach confidently >25 cm (10") From Standing Position, Pick up Object from Floor: Able to pick up shoe safely and easily From Standing Position, Turn to Look Behind Over each Shoulder: Turn sideways only but maintains balance Turn 360 Degrees: Able to turn 360 degrees safely one side only in 4 seconds or less Standing Unsupported, Alternately Place Feet on Step/Stool: Able to stand independently and complete 8 steps >20 seconds Standing Unsupported, One Foot in Front: Able to take small step independently and hold 30 seconds Standing on One Leg: Tries to lift leg/unable to hold 3 seconds but remains standing independently Total Score: 46 Static Sitting Balance Static Sitting - Balance Support: Feet supported Static Sitting - Level of Assistance: 6: Modified independent (Device/Increase time) Dynamic Sitting Balance Dynamic Sitting - Balance Support: During functional activity;Feet supported Dynamic Sitting - Level of Assistance: 6: Modified independent (Device/Increase time) Static Standing Balance Static Standing - Balance Support: During functional activity;No upper extremity supported Static Standing - Level of Assistance: 6: Modified independent (Device/Increase time) Dynamic Standing Balance Dynamic Standing - Level of Assistance: 5: Stand by assistance Dynamic Standing - Comments: Standing to complete dressing and grooming  tasks Extremity Assessment  RUE Assessment RUE Assessment: Within Functional Limits LUE Assessment LUE Assessment: Exceptions to [facility] (4/5 throughout)       See Function Navigator for Current Functional Status.  Lorie Phenix 11/10/2016, 2:16 PM

## 2016-11-10 NOTE — Progress Notes (Signed)
Subjective/Complaints: Pt somnolent but awakens to voice, doesn't remember if she had BM last noc  ROS- diffult to obtain due to somnolence and memory Objective: Vital Signs: Blood pressure 138/64, pulse 75, temperature 98 F (36.7 C), temperature source Oral, resp. rate 18, height 5\' 8"  (1.727 m), weight 98.5 kg (217 lb 1.6 oz), SpO2 99 %. No results found. Results for orders placed or performed during the hospital encounter of 11/06/16 (from the past 72 hour(s))  Glucose, capillary     Status: Abnormal   Collection Time: 11/07/16 11:51 AM  Result Value Ref Range   Glucose-Capillary 223 (H) 65 - 99 mg/dL  Urinalysis, Routine w reflex microscopic     Status: Abnormal   Collection Time: 11/07/16  2:56 PM  Result Value Ref Range   Color, Urine STRAW (A) YELLOW   APPearance CLEAR CLEAR   Specific Gravity, Urine 1.011 1.005 - 1.030   pH 6.0 5.0 - 8.0   Glucose, UA >=500 (A) NEGATIVE mg/dL   Hgb urine dipstick SMALL (A) NEGATIVE   Bilirubin Urine NEGATIVE NEGATIVE   Ketones, ur NEGATIVE NEGATIVE mg/dL   Protein, ur 100 (A) NEGATIVE mg/dL   Nitrite NEGATIVE NEGATIVE   Leukocytes, UA NEGATIVE NEGATIVE   RBC / HPF 0-5 0 - 5 RBC/hpf   WBC, UA 0-5 0 - 5 WBC/hpf   Bacteria, UA NONE SEEN NONE SEEN   Squamous Epithelial / LPF 0-5 (A) NONE SEEN  Urine culture     Status: None   Collection Time: 11/07/16  2:56 PM  Result Value Ref Range   Specimen Description URINE, CATHETERIZED    Special Requests NONE    Culture NO GROWTH    Report Status 11/08/2016 FINAL   Glucose, capillary     Status: Abnormal   Collection Time: 11/07/16  4:43 PM  Result Value Ref Range   Glucose-Capillary 332 (H) 65 - 99 mg/dL  Glucose, capillary     Status: Abnormal   Collection Time: 11/07/16  8:52 PM  Result Value Ref Range   Glucose-Capillary 216 (H) 65 - 99 mg/dL  Glucose, capillary     Status: Abnormal   Collection Time: 11/08/16  6:34 AM  Result Value Ref Range   Glucose-Capillary 117 (H) 65 - 99  mg/dL  Glucose, capillary     Status: Abnormal   Collection Time: 11/08/16 11:55 AM  Result Value Ref Range   Glucose-Capillary 329 (H) 65 - 99 mg/dL  Glucose, capillary     Status: Abnormal   Collection Time: 11/08/16  4:25 PM  Result Value Ref Range   Glucose-Capillary 327 (H) 65 - 99 mg/dL  Glucose, capillary     Status: Abnormal   Collection Time: 11/08/16  8:34 PM  Result Value Ref Range   Glucose-Capillary 173 (H) 65 - 99 mg/dL  Glucose, capillary     Status: Abnormal   Collection Time: 11/09/16  6:36 AM  Result Value Ref Range   Glucose-Capillary 165 (H) 65 - 99 mg/dL  Glucose, capillary     Status: Abnormal   Collection Time: 11/09/16 12:29 PM  Result Value Ref Range   Glucose-Capillary 297 (H) 65 - 99 mg/dL  Glucose, capillary     Status: Abnormal   Collection Time: 11/09/16  4:28 PM  Result Value Ref Range   Glucose-Capillary 247 (H) 65 - 99 mg/dL  Glucose, capillary     Status: Abnormal   Collection Time: 11/09/16  8:58 PM  Result Value Ref Range   Glucose-Capillary 199 (  H) 65 - 99 mg/dL  Glucose, capillary     Status: None   Collection Time: 11/10/16  6:30 AM  Result Value Ref Range   Glucose-Capillary 80 65 - 99 mg/dL     HEENT: normal Cardio: RRR and no murmur Resp: CTA B/L and unlabored GI: BS positive and non tender and non distended Extremity:  BS positive and NT, ND Skin:   Intact and Other IV sites ok Neuro: Alert/Oriented, Cranial Nerve II-XII normal, Normal Sensory, Abnormal Motor 4/5 in BUE and BLE, Abnormal FMC Ataxic/ dec FMC and Other Delayed responses Musc/Skel:  Normal and Other no pain with ROM Gen NAD   Assessment/Plan: 1. 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 Physiatrist is providing close team supervision and 24 hour management of active medical problems listed below. Physiatrist and rehab team continue to assess  barriers to discharge/monitor patient progress toward functional and medical goals. FIM: Function - Bathing Position: Shower Body parts bathed by patient: Right arm, Right upper leg, Left arm, Left upper leg, Chest, Abdomen, Front perineal area, Buttocks, Right lower leg, Left lower leg, Back Body parts bathed by helper: Back, Right lower leg, Left lower leg Assist Level: Supervision or verbal cues  Function- Upper Body Dressing/Undressing What is the patient wearing?: Pull over shirt/dress Pull over shirt/dress - Perfomed by patient: Thread/unthread right sleeve, Thread/unthread left sleeve, Put head through opening, Pull shirt over trunk Assist Level: Set up Set up : To obtain clothing/put away Function - Lower Body Dressing/Undressing What is the patient wearing?: Pants, Underwear Position: Wheelchair/chair at sink Underwear - Performed by patient: Thread/unthread right underwear leg, Thread/unthread left underwear leg, Pull underwear up/down Pants- Performed by patient: Thread/unthread right pants leg, Thread/unthread left pants leg, Pull pants up/down Non-skid slipper socks- Performed by patient: Don/doff right sock, Don/doff left sock Non-skid slipper socks- Performed by helper: Don/doff right sock, Don/doff left sock Assist for footwear: Supervision/touching assist Assist for lower body dressing: Supervision or verbal cues  Function - Toileting Toileting steps completed by patient: Adjust clothing prior to toileting, Performs perineal hygiene, Adjust clothing after toileting Toileting steps completed by helper: Adjust clothing after toileting Toileting Assistive Devices: Grab bar or rail Assist level: Touching or steadying assistance (Pt.75%)  Function - Air cabin crew transfer assistive device: Elevated toilet seat/BSC over toilet, Grab bar Assist level to toilet: No Help, no cues, assistive device, takes more than a reasonable amount of time Assist level from toilet:  Touching or steadying assistance (Pt > 75%) Assist level to bedside commode (at bedside): Touching or steadying assistance (Pt > 75%) Assist level from bedside commode (at bedside): Moderate assist (Pt 50 - 74%/lift or lower)  Function - Chair/bed transfer Chair/bed transfer method: Ambulatory Chair/bed transfer assist level: Supervision or verbal cues  Function - Locomotion: Wheelchair Will patient use wheelchair at discharge?: No Function - Locomotion: Ambulation Assistive device: Cane-quad Max distance: 26ft  Assist level: Supervision or verbal cues Assist level: Supervision or verbal cues Assist level: Touching or steadying assistance (Pt > 75%) Assist level: Supervision or verbal cues Assist level: Supervision or verbal cues  Function - Comprehension Comprehension: Auditory Comprehension assist level: Follows complex conversation/direction with extra time/assistive device  Function - Expression Expression: Verbal Expression assist level: Expresses complex ideas: With extra time/assistive device  Function - Social Interaction Social Interaction assist level: Interacts appropriately 90% of the time - Needs monitoring or encouragement for participation or interaction.  Function - Problem Solving Problem solving assist level: Solves basic problems with no assist  Function - Memory Memory assist level: Recognizes or recalls 75 - 89% of the time/requires cueing 10 - 24% of the time Patient normally able to recall (first 3 days only): Staff names and faces, That he or she is in a hospital  Medical Problem List and Plan: 1.  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 CIR PT, OT SLP, tent d/c 5/8   , 2.  DVT Prophylaxis/Anticoagulation: Pharmaceutical: Lovenox 3. Pain Management: continue gabapentin for neuropathy.  4. Mood: LCSW to follow for evaluation and support.  5. Neuropsych: This patient is not fully capable of  making decisions on her own behalf. 6. Skin/Wound Care: routine pressure relief measures 7. Fluids/Electrolytes/Nutrition: Monitor I/O. Check lytes in am.  8. T2DM with peripheral neuropathy: On lantus bid with SSI. Noncompliant with insulin use--continue to educate on importance of compliance. Monitor BS ac/hs  Added gliptin  CBG (last 3)   Recent Labs  11/09/16 1628 11/09/16 2058 11/10/16 0630  GLUCAP 247* 199* 80    9. Chronic insomnia: Will try tylenol pm (used Advil pm at home) 10. H/o ICH: On keppra 1000 mg bid. 11. HTN: monitor BP bid. Continue Norvasc, Metoprolol and Lisinopril.  12. Meningoencephalitis: Vancomycin/ceftriaxone--completed,ID f/u as outpt 13. Diarrhea: will check stool for c diff.  14. Bladder incontinence:  UA neg , oxybutnin qhs , pt now able to get to toilet with only mild leakage 15. Hypokalemia: Continue supplement.  16. Acute on chronic renal failure: Resolving -creat still mildly elevated would not restart metformin until this is normal 17. Polysubstance abuse: Counsel when appropriate.Neuropsych note appreciated 18. CVA: Cont meds 19.  Loose stools off glycolax , no incont episodes per RN last noc 20.  Hep C  ID will follow as outpt LOS (Days) 4 A FACE TO FACE EVALUATION WAS PERFORMED  Journei Thomassen E 11/10/2016, 7:11 AM

## 2016-11-10 NOTE — Progress Notes (Addendum)
Social Work  Discharge Note  The overall goal for the admission was met for:   Discharge location: [place] 24 HR SUPERVISION  Length of Stay: Yes-6 DAYS  Discharge activity level: Yes-SUPERVISION LEVEL  Home/community participation: Yes  Services provided included: MD, RD, PT, OT, SLP, RN, CM, Pharmacy, Neuropsych and SW  Financial Services: Medicaid  Follow-up services arranged: Home Health: [place] NOT ELIGIBLE FOR THERAPY SERVICES DUE TO MEDICAID GUIDELINES and Patient/Family request agency HH: HAD BEFORE IN 06/2016, DME: NO NEEDS  Comments (or additional information):PT DID WELL AND REACHED SUPERVISION LEVEL COULD NOT GET TO MOD/I DUE TO COGNITION/SAFETY ISSUES. PT IS ONE THAT DOES WHAT SHE WANTS ALTHOUGH IS  AWARE OF THE RISKS SHE IS TAKING BY CONTINUING TO USE DRUGS. WANTS TO GO HOME AND SAYS BOYFRIEND OF 35 YEARS WILL BE THERE WITH HER. NON-COMPLAINT WITH MEDICATIONS AND FOLLOW UP APPOINTMENTS. DECLINES SUBSTANCE ABUSE RESOURCES.  Patient/Family verbalized understanding of follow-up arrangements: Yes  Individual responsible for coordination of the follow-up plan: SELF & [name]-BOYFRIEND  Confirmed correct DME delivered: Elease Hashimoto 11/10/2016    Elease Hashimoto

## 2016-11-10 NOTE — Progress Notes (Addendum)
Social Work Patient ID: [name], female   DOB: [birth year], 61 y.o.   MRN: [id number]   Team feels pt has reached her rehab goals and want to discharge earlier than next Tuesday 5/8. Awaiting MD approval for when medically Ready for discharge. Pt is wanting to go home and doesn't  want to be here. MD feels she is medically table to go home tomorrow. Have informed pt and team, along with PA. Pt needs to get herself a ride home. Daughter contacted and made aware, she will work on a ride for pt home.

## 2016-11-10 NOTE — Discharge Instructions (Signed)
Inpatient Rehab Discharge Instructions  [name] Discharge date and time:  11/10/16  Activities/Precautions/ Functional Status: Activity: no lifting, driving, or strenuous exercise till cleared by MD Diet:  Chopped foods. Need to eat small bites and follow with sips of liquids. No straws. Diabetic diet.  Wound Care: none needed   Functional status:  ___ No restrictions     ___ Walk up steps independently _X__ 24/7 supervision/assistance   ___ Walk up steps with assistance ___ Intermittent supervision/assistance  ___ Bathe/dress independently ___ Walk with walker     _X__ Bathe/dress with assistance ___ Walk Independently    ___ Shower independently ___ Walk with assistance    ___ Shower with assistance _X__ No alcohol     ___ Return to work/school ________  Special Instructions: 1. Needs supervision with medication management.  2. Check blood sugars before meals and at bedtime. Take readings with you to your appointment next week for Dr. Jeanie Cooks to review so he can adjust insulin if needed.    COMMUNITY REFERRALS UPON DISCHARGE:    Home Health:   [place] NOT ELIGIBLE FOR THERAPIES DUE TO MEDICAID GUIDELINES  Agency:[facility] [employer]   Date of last service:11/11/2016  Medical Equipment/Items Ordered:NONE NEEDED-HAS FROM PREVIOUS ADMITS   Other:DECLINED SUBSTANCE ABUSE RESOURCES   My questions have been answered and I understand these instructions. I will adhere to these goals and the provided educational materials after my discharge from the hospital.  Patient/Caregiver Signature _______________________________ Date __________  Clinician Signature _______________________________________ Date __________  Please bring this form and your medication list with you to all your follow-up doctor's appointments.

## 2016-11-10 NOTE — Progress Notes (Signed)
Social Work Patient ID: [name], female   DOB: 08-27-1955, 61 y.o.   MRN: [id number] Met with pt to discuss ride tomorrow. Offered to contact [employer] to set up ride but needs 24 hr notice and pt would need to wait until tomorrow @ 3:00 pm now. She declined this and reports she will get herself a ride home. Have contacted her daughter regarding her Mom needing a ride home tomorrow. She will see if one of her friend's can come and get her. Left the [employer] number with pt in case this is needed. Pt wants to go home so think this will motivate her to get herself a ride.

## 2016-11-10 NOTE — Progress Notes (Signed)
Speech Language Pathology Discharge Summary  Patient Details  Name: [name] MRN: [id number] Date of Birth: 02/28/1956  Today's Date: 11/10/2016 SLP Individual Time: 1000-1100 SLP Individual Time Calculation (min): 60 min   Skilled Therapeutic Interventions:  Skilled treatment session focused on skilled observation of pt consuming trials of regular and mixed consistency textures. Nursing reports that pt was coughing this morning with food items. Pt consumed graham crackers with peanut butter, pudding and diet coke without s/s of overt aspiration. Pt with recall for use of throat clear after single sip of thin liquids. When consuming peaches with juice, pt with significant coughing that appeared related to decreased oral coordination. Pt voiced understanding of safe items to consume and agreeable with downgrading to dysphagia 2 for comfort as well. Pt was returned to room, left in bed and all needs within reach. Pt continues to state that she doesn't want to be in rehab and is ready to go home.      Patient has met 5 of 5 long term goals.  Patient to discharge at overall Supervision level.   Clinical Impression/Discharge Summary:   Pt has made good progress during brief ST course and has met her LTGs and appears at baseline. Extensive education provided to pt on safety and decreasing substance abuse as well.  Care Partner:  Caregiver Able to Provide Assistance: Other (comment) (Pt discharging with baseline level of care)  Type of Caregiver Assistance: Cognitive  Recommendation:  Home Health SLP;24 hour supervision/assistance  Rationale for SLP Follow Up: Maximize functional communication;Maximize cognitive function and independence;Maximize swallowing safety;Reduce caregiver burden   Equipment: N/A   Reasons for discharge: Treatment goals met (Pt wants to discharge and appears at baseline)   Patient/Family Agrees with Progress Made and Goals Achieved: Yes    Function:  Eating Eating   Modified Consistency Diet: Yes Eating Assist Level: Supervision or verbal cues           Cognition Comprehension Comprehension assist level: Understands complex 90% of the time/cues 10% of the time  Expression   Expression assist level: Expresses complex 90% of the time/cues < 10% of the time  Social Interaction Social Interaction assist level: Interacts appropriately 90% of the time - Needs monitoring or encouragement for participation or interaction.  Problem Solving Problem solving assist level: Solves basic problems with no assist  Memory Memory assist level: Recognizes or recalls 75 - 89% of the time/requires cueing 10 - 24% of the time   Solly Derasmo 11/10/2016, 12:43 PM

## 2016-11-10 NOTE — Progress Notes (Signed)
Occupational Therapy Discharge Summary  Patient Details  Name: [name] MRN: [id number] Date of Birth: 11/13/1955   Patient has met 12 of 12 long term goals due to improved activity tolerance, improved balance, postural control, improved attention, improved awareness and improved coordination.  Patient to discharge at overall Supervision level.  Family education not completed as pt's family unable to coordinate transportation for caregivers to get to rehab unit. Pt's husband is physically disabled and has home health aide who can assist pt at d/c. Pt also reports son and neighbors are able to assist at d/c.   Recommendation:  Patient does not qualify for follow up therapy recommendations though she would benefit from [facility] to cont to increase safety and awareness during ADL/IADL tasks.   Equipment: Pt has all needed DME  Reasons for discharge: treatment goals met and discharge from hospital  Patient/family agrees with progress made and goals achieved: Yes  OT Discharge Precautions/Restrictions  Precautions Precautions: Fall Precaution Comments: Bowel/urinary incontinence Restrictions Weight Bearing Restrictions: No Vision/Perception  Vision- Assessment Eye Alignment: Within Functional Limits  Cognition Overall Cognitive Status: No family/caregiver present to determine baseline cognitive functioning Arousal/Alertness: Awake/alert Orientation Level: Oriented X4 Attention: Sustained;Selective Focused Attention: Appears intact Focused Attention Impairment: Functional complex;Verbal complex Sustained Attention: Appears intact Selective Attention: Impaired Selective Attention Impairment: Verbal basic;Functional basic Memory: Impaired Memory Impairment: Storage deficit;Retrieval deficit;Decreased recall of new information Awareness: Impaired Awareness Impairment: Intellectual impairment Problem Solving: Impaired Problem Solving Impairment: Functional complex;Verbal  basic Reasoning: Impaired Reasoning Impairment: Verbal basic;Functional basic Sequencing: Appears intact Sequencing Impairment: Verbal basic;Functional basic Decision Making: Impaired Decision Making Impairment: Functional basic;Functional complex Initiating: Appears intact Safety/Judgment: Appears intact Comments: Safety intact with mobility, however, pt with poor adherence to swallowing precautions and following medical recommendations Sensation Sensation Light Touch: Appears Intact Stereognosis: Appears Intact Hot/Cold: Appears Intact Coordination Gross Motor Movements are Fluid and Coordinated: Yes Fine Motor Movements are Fluid and Coordinated: Yes Finger Nose Finger Test: [facility] Motor  Motor Motor: Within Functional Limits Trunk/Postural Assessment  Cervical Assessment Cervical Assessment: Exceptions to [facility] (Forward head) Thoracic Assessment Thoracic Assessment: Exceptions to [facility] (Rounded shoulders) Lumbar Assessment Lumbar Assessment: Exceptions to [facility] (Posterior pelvic tilt) Postural Control Postural Control: Within Functional Limits  Balance Balance Balance Assessed: Yes Static Sitting Balance Static Sitting - Balance Support: Feet supported Static Sitting - Level of Assistance: 6: Modified independent (Device/Increase time) Dynamic Sitting Balance Dynamic Sitting - Balance Support: During functional activity;Feet supported Dynamic Sitting - Level of Assistance: 6: Modified independent (Device/Increase time) Static Standing Balance Static Standing - Balance Support: During functional activity;No upper extremity supported Dynamic Standing Balance Dynamic Standing - Level of Assistance: 6: Modified independent (Device/Increase time) Dynamic Standing - Comments: Standing to complete dressing and grooming tasks Extremity/Trunk Assessment RUE Assessment RUE Assessment: Within Functional Limits LUE Assessment LUE Assessment: Exceptions to [facility] (4/5 throughout)   See  Function Navigator for Current Functional Status.  [place], [place] 11/10/2016, 12:28 PM

## 2016-11-10 NOTE — Discharge Summary (Signed)
Physician Discharge Summary  Patient ID: [name] MRN: [id number] DOB/AGE: 10/16/1955 61 y.o.  Admit date: 11/06/2016 Discharge date: 11/11/2016  Discharge Diagnoses:  Principal Problem:   Meningitis Active Problems:   Hypertension   AKI (acute kidney injury) ([place])   Chronic hepatitis C without hepatic coma (HCC)   Labile blood glucose   Poorly controlled type 2 diabetes mellitus with peripheral neuropathy (HCC)   Benign essential HTN   Discharged Condition: stable  Significant Diagnostic Studies:  Mr Brain 60 Contrast  Result Date: 11/02/2016 CLINICAL DATA:  61 year old female found down with confusion. Prior small vessel disease including brainstem hemorrhage in 2017. Questionable acute lacunar infarct of the medial right basal ganglia on limited study yesterday. EXAM: MRI HEAD WITHOUT CONTRAST TECHNIQUE: Multiplanar, multiecho pulse sequences of the brain and surrounding structures were obtained without intravenous contrast. COMPARISON:  Brain MRI 11/01/2016 and earlier. FINDINGS: Brain: Subcentimeter focus of restricted diffusion again suspected at the right deep gray matter nuclei, epicenter at the globus pallidus near the junction with the anterior right thalamus and internal capsule (series 4, image 27). No associated hemorrhage or mass effect. Mild if any associated T2 and FLAIR hyperintensity. No other restricted diffusion. Expected evolution of the pontine hemorrhage since November with hemosiderin and encephalomalacia. Abnormal T2 and FLAIR hyperintensity has developed in the bilateral middle cerebellar peduncles. Underlying chronic right pontine lacune. Chronic heterogeneity in the left thalamus is stable. Cerebral hemisphere gray and white matter signal appears stable and within normal limits. No midline shift, mass effect, evidence of mass lesion, ventriculomegaly, extra-axial collection or acute intracranial hemorrhage. Cervicomedullary junction and pituitary are within  normal limits. Vascular: Major intracranial vascular flow voids are stable since 2017. Skull and upper cervical spine: Anterior cervical bulky osteophytosis. Visualized bone marrow signal is within normal limits. Sinuses/Orbits: Stable and negative. Other: Visible internal auditory structures appear normal. Mastoids remain clear. Stable posterior inferior left parotid space soft tissue nodule measuring 21 mm since 2017 (series 3, image 24). IMPRESSION: 1. Small acute lacunar infarct along the confluence of the right globus pallidus. No associated hemorrhage or mass effect. 2. Expected evolution of the left brainstem hemorrhage since November. 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 3. 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 4. 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 ENT follow-up again recommended. Electronically Signed   By: Genevie Ann M.D.   On: 11/02/2016 12:47    Labs:  Basic Metabolic Panel: BMP Latest Ref Rng & Units 11/07/2016 11/06/2016 11/05/2016  Glucose 65 - 99 mg/dL 206(H) 121(H) 97  BUN 6 - 20 mg/dL 6 6 6   Creatinine 0.44 - 1.00 mg/dL 1.18(H) 1.19(H) 1.28(H)  Sodium 135 - 145 mmol/L 140 143 143  Potassium 3.5 - 5.1 mmol/L 4.2 3.2(L) 4.9  Chloride 101 - 111 mmol/L 114(H) 116(H) 117(H)  CO2 22 - 32 mmol/L 20(L) 21(L) 19(L)  Calcium 8.9 - 10.3 mg/dL 8.5(L) 8.3(L) 8.3(L)    CBC: CBC Latest Ref Rng & Units 11/07/2016 11/04/2016 11/03/2016  WBC 4.0 - 10.5 K/uL 8.9 8.8 13.5(H)  Hemoglobin 12.0 - 15.0 g/dL 12.5 12.9 14.3  Hematocrit 36.0 - 46.0 % 37.8 39.7 44.6  Platelets 150 - 400 K/uL 203 171 175    CBG:  Recent Labs Lab 11/11/16 1615 11/11/16 2126 11/12/16 0633 11/12/16 1147 11/12/16 1406  GLUCAP 100* 108* 121* 174* 242*    Brief HPI:    [name] an  61 y.o.female with hx of T2DM-uncontrolled, HTN, CKD, tobacco/cocaine abuse, pontine  hemorrhage who was admitted on 10/31/16 with lethargy, confusion with inability to follow commands, incontinence and inability to speak. Work up revealed BS1200 with lactic acidosis and UDS was positive for cocaine. She was treated with fluid boluses as well as IV insulin for HONK and started on broad spectrum antibiotics for sepsis. Neurology consulted due to concerns of seizures due left facial and LUE twitching. She was loaded with keppra and continued to have fevers.  CSF showed 22-24 WBC with neurophilic predominance and  7 day treatment of meningoencephalitis recommended by ID.  MRI brain showed small acute right lacunar infarct which was felt to be an incidental finding and not cause of MS changes. out significant stenosis.  She was also found to be positive for Hep C and MRI showed left parotid neoplasm with ENT follow up recommended. jn liquids--no straws.   Blood pressure control as well as acute on chronic renal failure improving. Lethargy has resolve and patient noted to be deconditioned with unsteady staggering gait, decreased awareness of deficits, impulsivity with poor awareness of deficits. CIR recommended due to impairments in ability to carry out ADL tasks and deficits in mobility.   Hospital Course: [name] was admitted to rehab 11/06/2016 for inpatient therapies to consist of PT, ST and OT at least three hours five days a week. Past admission physiatrist, therapy team and rehab RN have worked together to provide customized collaborative inpatient rehab. Her blood pressures were monitored on bid basis and lisinopril was increased to bid for better control. Her blood sugars were monitored on ac/hs basis and were poorly controlled on lantus/novolog regimen. She refused to use two different insulins and preferred to go back to her 70/30 insulin. gliptin was discontinued to avoid hypoglemia and  Insulin was resumed at home dose but she developed hypoglycemia on this therefore this was  decreased to 22 units bid. Her blood sugars are showing improvement and intake has been good.  She was maintained on Keppra for seizure prophylaxis and dose was decreased per neurology recommendations. She competed her course of antibiotics on 4/30 and has been afebrile during her stay.    ID was contacted regarding input on positive Hep C and recommended follow up after discharge for treatment options. She has had issues with urinary incontinence and UA was negative. She was started on ditropan with improvement in continence and minimal leakage. She has required encouragement for consistent participation in therapy and continues to require supervision due to poor safety awareness and impaired cognition. Her length of stay was cut short due to her insistent on earlier discharge and reports that boyfriend would provide supervision needed. She is not eligible for home therapy due to Medicaid and [facility] has been arranged via [place] to continue to educated/reinforce medication compliance after discharge.     Rehab course: During patient's stay in rehab weekly team conferences were held to monitor patient's progress, set goals and discuss barriers to discharge. She required mod assist with ADL tasks and min assist for  Mobility.  She required max assist for ADL tasks and min assist with mobility. She demonstrated moderate cognitive impairments with impulsivity. She has had improvement in activity tolerance, balance, postural control, as well as ability to compensate for deficits. She is able to complete ADL tasks with supervision. She is modified independent for transfers and is able to ambulate 200' with QC and supervision. Diet was downgraded to dysphagia 2 for  comfort and she has been educated on use of throat clears after liquids. She is able to sustain attention with min cues and able to perform basic tasks with supervision.  Family was not available to complete education.    Disposition: 01-Home or  Self Care  Diet: Diabetic diet.   Special Instructions: 1. Check blood sugars ac/hs and follow up with primary MD for adjustment of insulin. 2. Needs referral to ENT for evaluation of parotid mass. 3. Needs supervision for safety.    Discharge Instructions    Ambulatory referral to Infectious Disease    Complete by:  As directed    2-3 weeks follow up/input on Hep C treatment   Ambulatory referral to Physical Medicine Rehab    Complete by:  As directed    1-2 weeks transitional care appt     Allergies as of 11/12/2016   No Known Allergies     Medication List    STOP taking these medications   cyclobenzaprine 10 MG tablet Commonly known as:  FLEXERIL   gabapentin 300 MG capsule Commonly known as:  NEURONTIN   guaiFENesin 600 MG 12 hr tablet Commonly known as:  MUCINEX   lisinopril-hydrochlorothiazide 20-12.5 MG tablet Commonly known as:  PRINZIDE,ZESTORETIC   nicotine 14 mg/24hr patch Commonly known as:  NICODERM CQ - dosed in mg/24 hours   NOREL AD 4-10-325 MG Tabs Generic drug:  Chlorphen-PE-Acetaminophen   pantoprazole 40 MG tablet Commonly known as:  PROTONIX   potassium chloride SA 20 MEQ tablet Commonly known as:  K-DUR,KLOR-CON   RESOURCE THICKENUP CLEAR Powd   sitaGLIPtin-metformin 50-500 MG tablet Commonly known as:  JANUMET   traMADol 50 MG tablet Commonly known as:  ULTRAM   traZODone 100 MG tablet Commonly known as:  DESYREL     TAKE these medications   amLODipine 10 MG tablet Commonly known as:  NORVASC Take 1 tablet (10 mg total) by mouth daily. Notes to patient:  For blood pressure   aspirin 81 MG chewable tablet Chew 1 tablet (81 mg total) by mouth daily.   atorvastatin 40 MG tablet Commonly known as:  LIPITOR Take 1 tablet (40 mg total) by mouth daily at 6 PM. Notes to patient:  For elevated cholesterol   folic acid 1 MG tablet Commonly known as:  FOLVITE Take 1 tablet (1 mg total) by mouth daily.   insulin aspart  protamine- aspart (70-30) 100 UNIT/ML injection Commonly known as:  NOVOLOG MIX 70/30 Inject 0.22 mLs (22 Units total) into the skin 2 (two) times daily with a meal. What changed:  how much to take   levETIRAcetam 500 MG tablet Commonly known as:  KEPPRA Take 1 tablet (500 mg total) by mouth 2 (two) times daily. Notes to patient:  To prevent seizures   lisinopril 5 MG tablet Commonly known as:  PRINIVIL,ZESTRIL Take 3 tablets (15 mg total) by mouth 2 (two) times daily.   metoprolol tartrate 25 MG tablet Commonly known as:  LOPRESSOR Take 1 tablet (25 mg total) by mouth 2 (two) times daily. Notes to patient:  For blood pressure and heart.   multivitamin with minerals Tabs tablet Take 1 tablet by mouth daily.   oxybutynin 5 MG 24 hr tablet Commonly known as:  DITROPAN-XL Take 1 tablet (5 mg total) by mouth at bedtime. Notes to patient:  To help with bladder control   PROAIR HFA 108 (90 Base) MCG/ACT inhaler Generic drug:  albuterol Inhale 2 puffs into the lungs 4 (four) times daily  as needed for wheezing.      Follow-up Information    Kirsteins, Luanna Salk, MD Follow up.   Specialty:  Physical Medicine and Rehabilitation Why:  office will call you with follow up appointment Contact information: [place] [address] [phone]        Carlyle Basques, MD Follow up.   Specialty:  Infectious Diseases Why:  office will call you with appointment for treatment of Hepatitis C Contact information: [place] [address] [phone]        Nolene Ebbs, MD Follow up on 11/16/2016.   Specialty:  Internal Medicine Why:  Appointment @ 2:30 PM. Needs referral to ENT to evaluate parotid mass.  Contact information: [place] [address] [phone]           Signed: Bary Leriche 11/13/2016, 10:19 AM

## 2016-11-11 DIAGNOSIS — E1165 Type 2 diabetes mellitus with hyperglycemia: Secondary | ICD-10-CM

## 2016-11-11 DIAGNOSIS — N3941 Urge incontinence: Secondary | ICD-10-CM

## 2016-11-11 DIAGNOSIS — E11649 Type 2 diabetes mellitus with hypoglycemia without coma: Secondary | ICD-10-CM

## 2016-11-11 DIAGNOSIS — R7309 Other abnormal glucose: Secondary | ICD-10-CM

## 2016-11-11 DIAGNOSIS — B182 Chronic viral hepatitis C: Secondary | ICD-10-CM

## 2016-11-11 DIAGNOSIS — E1142 Type 2 diabetes mellitus with diabetic polyneuropathy: Secondary | ICD-10-CM

## 2016-11-11 LAB — GLUCOSE, CAPILLARY
GLUCOSE-CAPILLARY: 90 mg/dL (ref 65–99)
GLUCOSE-CAPILLARY: 100 mg/dL — AB (ref 65–99)
GLUCOSE-CAPILLARY: 108 mg/dL — AB (ref 65–99)
Glucose-Capillary: 54 mg/dL — ABNORMAL LOW (ref 65–99)
Glucose-Capillary: 169 mg/dL — ABNORMAL HIGH (ref 65–99)

## 2016-11-11 MED ORDER — INSULIN ASPART PROT & ASPART (70-30 MIX) 100 UNIT/ML ~~LOC~~ SUSP
22.0000 [IU] | Freq: Two times a day (BID) | SUBCUTANEOUS | Status: DC
  Administered 2016-11-11 – 2016-11-12 (×3): 22 [IU] via SUBCUTANEOUS
  Filled 2016-11-11: qty 10

## 2016-11-11 MED ORDER — INSULIN ASPART PROT & ASPART (70-30 MIX) 100 UNIT/ML ~~LOC~~ SUSP
25.0000 [IU] | Freq: Two times a day (BID) | SUBCUTANEOUS | Status: DC
  Filled 2016-11-11: qty 10

## 2016-11-11 NOTE — Progress Notes (Signed)
Hypoglycemic Event  CBG: 68  Treatment: 15 GM carbohydrate snack  Symptoms: None  Follow-up CBG: Time:2116 CBG Result: 78  Possible Reasons for Event: Unknown  Comments/MD notified: to notify    Temitayo Covalt, [employer]

## 2016-11-11 NOTE — Progress Notes (Signed)
Subjective/Complaints: Pt seen laying in bed this AM.  Her CBGs remain extremely labile  ROS- Limited due to memory, but appears to deny CP, SOB, N/V/D  Objective: Vital Signs: Blood pressure (!) 166/76, pulse 87, temperature 98.9 F (37.2 C), temperature source Oral, resp. rate 18, height 5\' 8"  (1.727 m), weight 98.5 kg (217 lb 1.6 oz), SpO2 100 %. No results found. Results for orders placed or performed during the hospital encounter of 11/06/16 (from the past 72 hour(s))  Glucose, capillary     Status: Abnormal   Collection Time: 11/08/16 11:55 AM  Result Value Ref Range   Glucose-Capillary 329 (H) 65 - 99 mg/dL  Glucose, capillary     Status: Abnormal   Collection Time: 11/08/16  4:25 PM  Result Value Ref Range   Glucose-Capillary 327 (H) 65 - 99 mg/dL  Glucose, capillary     Status: Abnormal   Collection Time: 11/08/16  8:34 PM  Result Value Ref Range   Glucose-Capillary 173 (H) 65 - 99 mg/dL  Glucose, capillary     Status: Abnormal   Collection Time: 11/09/16  6:36 AM  Result Value Ref Range   Glucose-Capillary 165 (H) 65 - 99 mg/dL  Glucose, capillary     Status: Abnormal   Collection Time: 11/09/16 12:29 PM  Result Value Ref Range   Glucose-Capillary 297 (H) 65 - 99 mg/dL  Glucose, capillary     Status: Abnormal   Collection Time: 11/09/16  4:28 PM  Result Value Ref Range   Glucose-Capillary 247 (H) 65 - 99 mg/dL  Glucose, capillary     Status: Abnormal   Collection Time: 11/09/16  8:58 PM  Result Value Ref Range   Glucose-Capillary 199 (H) 65 - 99 mg/dL  Glucose, capillary     Status: None   Collection Time: 11/10/16  6:30 AM  Result Value Ref Range   Glucose-Capillary 80 65 - 99 mg/dL  Glucose, capillary     Status: Abnormal   Collection Time: 11/10/16 12:06 PM  Result Value Ref Range   Glucose-Capillary 310 (H) 65 - 99 mg/dL  Glucose, capillary     Status: None   Collection Time: 11/10/16  8:57 PM  Result Value Ref Range   Glucose-Capillary 68 65 - 99  mg/dL  Glucose, capillary     Status: None   Collection Time: 11/10/16  9:16 PM  Result Value Ref Range   Glucose-Capillary 78 65 - 99 mg/dL  Glucose, capillary     Status: Abnormal   Collection Time: 11/11/16  6:30 AM  Result Value Ref Range   Glucose-Capillary 54 (L) 65 - 99 mg/dL  Glucose, capillary     Status: None   Collection Time: 11/11/16  6:48 AM  Result Value Ref Range   Glucose-Capillary 90 65 - 99 mg/dL     HEENT: normocephalic. Atraumatic. Cardio: RRR and no JVD Resp: CTA B/L and unlabored GI: BS positive and non tender  Skin:   Intact. Warm and dry Neuro: Alert/Oriented,  Motor 4+/5 throughout Delayed responses Musc/Skel:  No edema. No tenderness.   Gen NAD. Well developed.    Assessment/Plan: 1. 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 Physiatrist is providing close team supervision and 24 hour management of active medical problems listed below. Physiatrist and rehab team continue to assess barriers to discharge/monitor patient progress toward functional and medical goals. FIM: Function - Bathing Position: Shower Body parts bathed by  patient: Right arm, Right upper leg, Left arm, Left upper leg, Chest, Abdomen, Front perineal area, Buttocks, Right lower leg, Left lower leg, Back Body parts bathed by helper: Back, Right lower leg, Left lower leg Assist Level: More than reasonable time  Function- Upper Body Dressing/Undressing What is the patient wearing?: Pull over shirt/dress Pull over shirt/dress - Perfomed by patient: Thread/unthread right sleeve, Thread/unthread left sleeve, Put head through opening, Pull shirt over trunk Assist Level: More than reasonable time Set up : To obtain clothing/put away Function - Lower Body Dressing/Undressing What is the patient wearing?: Pants, Underwear, Non-skid slipper socks Position: Wheelchair/chair at  sink Underwear - Performed by patient: Thread/unthread right underwear leg, Thread/unthread left underwear leg, Pull underwear up/down Pants- Performed by patient: Thread/unthread right pants leg, Thread/unthread left pants leg, Pull pants up/down Non-skid slipper socks- Performed by patient: Don/doff right sock, Don/doff left sock Non-skid slipper socks- Performed by helper: Don/doff right sock, Don/doff left sock Assist for footwear: Independent Assist for lower body dressing: Supervision or verbal cues  Function - Toileting Toileting steps completed by patient: Adjust clothing prior to toileting, Performs perineal hygiene, Adjust clothing after toileting Toileting steps completed by helper: Adjust clothing after toileting Toileting Assistive Devices: Grab bar or rail Assist level: More than reasonable time  Function - Air cabin crew transfer assistive device: Elevated toilet seat/BSC over toilet Assist level to toilet: No Help, no cues, assistive device, takes more than a reasonable amount of time Assist level from toilet: No Help, no cues, assistive device, takes more than a reasonable amount of time Assist level to bedside commode (at bedside): Touching or steadying assistance (Pt > 75%) Assist level from bedside commode (at bedside): Moderate assist (Pt 50 - 74%/lift or lower)  Function - Chair/bed transfer Chair/bed transfer method: Ambulatory Chair/bed transfer assist level: Supervision or verbal cues Chair/bed transfer assistive device: Cane  Function - Locomotion: Wheelchair Will patient use wheelchair at discharge?: No Wheelchair activity did not occur: N/A Wheel 50 feet with 2 turns activity did not occur: N/A Wheel 150 feet activity did not occur: N/A Function - Locomotion: Ambulation Assistive device: Cane-quad Max distance: 2108ft  Assist level: Supervision or verbal cues Assist level: Supervision or verbal cues Assist level: Supervision or verbal  cues Assist level: Supervision or verbal cues Assist level: Supervision or verbal cues  Function - Comprehension Comprehension: Auditory Comprehension assist level: Understands complex 90% of the time/cues 10% of the time  Function - Expression Expression: Verbal Expression assist level: Expresses complex 90% of the time/cues < 10% of the time  Function - Social Interaction Social Interaction assist level: Interacts appropriately 90% of the time - Needs monitoring or encouragement for participation or interaction.  Function - Problem Solving Problem solving assist level: Solves basic problems with no assist  Function - Memory Memory assist level: Recognizes or recalls 75 - 89% of the time/requires cueing 10 - 24% of the time Patient normally able to recall (first 3 days only): Staff names and faces, That he or she is in a hospital, Location of own room, Current season  Medical Problem List and Plan: 1.  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  Cont CIR  2.  DVT Prophylaxis/Anticoagulation: Pharmaceutical: Lovenox 3. Pain Management: continue gabapentin for neuropathy.  4. Mood: LCSW to follow for evaluation and support.  5. Neuropsych: This patient is not fully capable of making decisions on her own behalf. 6. Skin/Wound Care: routine pressure relief measures 7. Fluids/Electrolytes/Nutrition: Monitor I/Os.  8. T2DM with peripheral neuropathy: SSI. Noncompliant with insulin use--continue to educate on importance of compliance. Monitor BS ac/hs  Added gliptin  Novolog 70/30 adjusted to 22U BID - remains extemely labile with hypoglycemia CBG (last 3)   Recent Labs  11/10/16 2116 11/11/16 0630 11/11/16 0648  GLUCAP 78 54* 90    9. Chronic insomnia: Tylenol pm (used Advil pm at home) 10. H/o ICH: On keppra 1000 mg bid. 11. HTN: monitor BP bid. Continue Norvasc, Metoprolol and Lisinopril.  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 Diarrhea: will check stool for c diff.  14. Bladder incontinence:  UA neg , oxybutnin qhs , pt now able to get to toilet with only mild leakage 15. Hypokalemia: Continue supplement.  16. Acute on chronic renal failure: Resolving -creat still mildly elevated would not restart metformin until this is normal 17. Polysubstance abuse: Counsel when appropriate.Neuropsych note appreciated 18. CVA: Cont meds 19.  Loose stools off glycolax, 20.  Hep C  ID will follow as outpt LOS (Days) 5 A FACE TO FACE EVALUATION WAS PERFORMED  Ankit Lorie Phenix 11/11/2016, 7:03 AM

## 2016-11-11 NOTE — Progress Notes (Signed)
Hypoglycemic Event  CBG: 54  Treatment: 15 GM carbohydrate snack  Symptoms: None  Follow-up CBG: LEXN:1700 CBG Result:90 Possible Reasons for Event: Unknown  Comments/MD notified:md aware    Renelda Kilian, [employer]

## 2016-11-12 DIAGNOSIS — I10 Essential (primary) hypertension: Secondary | ICD-10-CM

## 2016-11-12 LAB — GLUCOSE, CAPILLARY
GLUCOSE-CAPILLARY: 121 mg/dL — AB (ref 65–99)
GLUCOSE-CAPILLARY: 174 mg/dL — AB (ref 65–99)
Glucose-Capillary: 242 mg/dL — ABNORMAL HIGH (ref 65–99)

## 2016-11-12 MED ORDER — LISINOPRIL 5 MG PO TABS
15.0000 mg | ORAL_TABLET | Freq: Two times a day (BID) | ORAL | Status: DC
  Administered 2016-11-12: 08:00:00 15 mg via ORAL
  Filled 2016-11-12: qty 1

## 2016-11-12 MED ORDER — INSULIN ASPART PROT & ASPART (70-30 MIX) 100 UNIT/ML ~~LOC~~ SUSP: 22.0000 [IU] | Freq: Two times a day (BID) | SUBCUTANEOUS | 11 refills | Status: DC

## 2016-11-12 MED ORDER — LISINOPRIL 5 MG PO TABS: 15.0000 mg | ORAL_TABLET | Freq: Two times a day (BID) | ORAL | 0 refills | Status: DC

## 2016-11-12 NOTE — Progress Notes (Signed)
Patient discharged to home with her brother. Patient verbalizes understanding of discharge instructions.  Nurse tech escorted patient/family to lobby.

## 2016-11-12 NOTE — Progress Notes (Addendum)
Spoke to Dr. Posey Pronto about patient's low blood sugar this morning. Per MD, cancel discharge to home for now. Day shift RN made aware of the plan.  RN spoke to Dr. Posey Pronto at 650 am. Patient can be discharged to home after lunch today.

## 2016-11-12 NOTE — Progress Notes (Signed)
Subjective/Complaints: Pt seen laying in bed this AM.  CBGs controlled in last 24 hours, will cont to monitor and if stable until this afternoon, will plan for d/c today.  Pt would like to go home.   ROS- Limited due to memory, but appears to deny CP, SOB, N/V/D  Objective: Vital Signs: Blood pressure (!) 159/73, pulse 81, temperature 98.6 F (37 C), temperature source Oral, resp. rate 18, height 5\' 8"  (1.727 m), weight 98.5 kg (217 lb 1.6 oz), SpO2 98 %. No results found. Results for orders placed or performed during the hospital encounter of 11/06/16 (from the past 72 hour(s))  Glucose, capillary     Status: Abnormal   Collection Time: 11/09/16 12:29 PM  Result Value Ref Range   Glucose-Capillary 297 (H) 65 - 99 mg/dL  Glucose, capillary     Status: Abnormal   Collection Time: 11/09/16  4:28 PM  Result Value Ref Range   Glucose-Capillary 247 (H) 65 - 99 mg/dL  Glucose, capillary     Status: Abnormal   Collection Time: 11/09/16  8:58 PM  Result Value Ref Range   Glucose-Capillary 199 (H) 65 - 99 mg/dL  Glucose, capillary     Status: None   Collection Time: 11/10/16  6:30 AM  Result Value Ref Range   Glucose-Capillary 80 65 - 99 mg/dL  Glucose, capillary     Status: Abnormal   Collection Time: 11/10/16 12:06 PM  Result Value Ref Range   Glucose-Capillary 310 (H) 65 - 99 mg/dL  Glucose, capillary     Status: None   Collection Time: 11/10/16  8:57 PM  Result Value Ref Range   Glucose-Capillary 68 65 - 99 mg/dL  Glucose, capillary     Status: None   Collection Time: 11/10/16  9:16 PM  Result Value Ref Range   Glucose-Capillary 78 65 - 99 mg/dL  Glucose, capillary     Status: Abnormal   Collection Time: 11/11/16  6:30 AM  Result Value Ref Range   Glucose-Capillary 54 (L) 65 - 99 mg/dL  Glucose, capillary     Status: None   Collection Time: 11/11/16  6:48 AM  Result Value Ref Range   Glucose-Capillary 90 65 - 99 mg/dL  Glucose, capillary     Status: Abnormal   Collection Time: 11/11/16 11:34 AM  Result Value Ref Range   Glucose-Capillary 169 (H) 65 - 99 mg/dL  Glucose, capillary     Status: Abnormal   Collection Time: 11/11/16  4:15 PM  Result Value Ref Range   Glucose-Capillary 100 (H) 65 - 99 mg/dL  Glucose, capillary     Status: Abnormal   Collection Time: 11/11/16  9:26 PM  Result Value Ref Range   Glucose-Capillary 108 (H) 65 - 99 mg/dL  Glucose, capillary     Status: Abnormal   Collection Time: 11/12/16  6:33 AM  Result Value Ref Range   Glucose-Capillary 121 (H) 65 - 99 mg/dL     HEENT: normocephalic. Atraumatic. Cardio: RRR and no JVD Resp: CTA B/L and unlabored GI: BS positive and non tender  Skin:   Intact. Warm and dry Neuro: Alert/Oriented,  Motor 4+/5 throughout Delayed responses Musc/Skel:  No edema. No tenderness.   Gen NAD. Well developed.    Assessment/Plan: 1. 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 Physiatrist is providing close team supervision and 24 hour management of active medical problems listed below. Physiatrist and rehab team  continue to assess barriers to discharge/monitor patient progress toward functional and medical goals. FIM: Function - Bathing Position: Shower Body parts bathed by patient: Right arm, Right upper leg, Left arm, Left upper leg, Chest, Abdomen, Front perineal area, Buttocks, Right lower leg, Left lower leg, Back Body parts bathed by helper: Back, Right lower leg, Left lower leg Assist Level: More than reasonable time  Function- Upper Body Dressing/Undressing What is the patient wearing?: Pull over shirt/dress Pull over shirt/dress - Perfomed by patient: Thread/unthread right sleeve, Thread/unthread left sleeve, Put head through opening, Pull shirt over trunk Assist Level: More than reasonable time Set up : To obtain clothing/put away Function - Lower Body  Dressing/Undressing What is the patient wearing?: Pants, Underwear, Non-skid slipper socks Position: Wheelchair/chair at sink Underwear - Performed by patient: Thread/unthread right underwear leg, Thread/unthread left underwear leg, Pull underwear up/down Pants- Performed by patient: Thread/unthread right pants leg, Thread/unthread left pants leg, Pull pants up/down Non-skid slipper socks- Performed by patient: Don/doff right sock, Don/doff left sock Non-skid slipper socks- Performed by helper: Don/doff right sock, Don/doff left sock Assist for footwear: Independent Assist for lower body dressing: Supervision or verbal cues  Function - Toileting Toileting steps completed by patient: Adjust clothing prior to toileting, Performs perineal hygiene, Adjust clothing after toileting Toileting steps completed by helper: Adjust clothing after toileting Toileting Assistive Devices: Grab bar or rail Assist level: More than reasonable time  Function - Air cabin crew transfer assistive device: Grab bar Assist level to toilet: No Help, No cues Assist level from toilet: No Help, No cues Assist level to bedside commode (at bedside): Touching or steadying assistance (Pt > 75%) Assist level from bedside commode (at bedside): Moderate assist (Pt 50 - 74%/lift or lower)  Function - Chair/bed transfer Chair/bed transfer method: Ambulatory Chair/bed transfer assist level: Supervision or verbal cues Chair/bed transfer assistive device: Cane  Function - Locomotion: Wheelchair Will patient use wheelchair at discharge?: No Wheelchair activity did not occur: N/A Wheel 50 feet with 2 turns activity did not occur: N/A Wheel 150 feet activity did not occur: N/A Function - Locomotion: Ambulation Assistive device: Cane-quad Max distance: 210ft  Assist level: Supervision or verbal cues Assist level: Supervision or verbal cues Assist level: Supervision or verbal cues Assist level: Supervision or verbal  cues Assist level: Supervision or verbal cues  Function - Comprehension Comprehension: Auditory Comprehension assist level: Understands complex 90% of the time/cues 10% of the time  Function - Expression Expression: Verbal Expression assist level: Expresses complex 90% of the time/cues < 10% of the time  Function - Social Interaction Social Interaction assist level: Interacts appropriately 90% of the time - Needs monitoring or encouragement for participation or interaction.  Function - Problem Solving Problem solving assist level: Solves basic problems with no assist  Function - Memory Memory assist level: Recognizes or recalls 75 - 89% of the time/requires cueing 10 - 24% of the time Patient normally able to recall (first 3 days only): Staff names and faces, That he or she is in a hospital, Location of own room, Current season  Medical Problem List and Plan: 1.  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  Plan for d/c today 2.  DVT Prophylaxis/Anticoagulation: Pharmaceutical: Lovenox 3. Pain Management: continue gabapentin for neuropathy.  4. Mood: LCSW to follow for evaluation and support.  5. Neuropsych: This patient is not fully capable of making decisions on her own behalf. 6. Skin/Wound Care: routine pressure relief measures 7. Fluids/Electrolytes/Nutrition: Monitor  I/Os.  8. T2DM with peripheral neuropathy: SSI. Noncompliant with insulin use--continue to educate on importance of compliance. Monitor BS ac/hs  Added gliptin  Novolog 70/30 adjusted to 22U BID   Significantly improved in last 24 hours. CBG (last 3)   Recent Labs  11/11/16 1615 11/11/16 2126 11/12/16 0633  GLUCAP 100* 108* 121*    9. Chronic insomnia: Tylenol pm (used Advil pm at home) 10. H/o ICH: On keppra 1000 mg bid. 11. HTN: monitor BP bid. Continue Norvasc, Metoprolol  Lisinopril increased to 15 BID on 5/6 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 Diarrhea: will check stool for c diff.  14. Bladder incontinence:  UA neg , oxybutnin qhs , pt now able to get to toilet with only mild leakage 15. Hypokalemia: Continue supplement.  16. Acute on chronic renal failure: Resolving -creat still mildly elevated would not restart metformin until this is normal 17. Polysubstance abuse: Counsel when appropriate.Neuropsych note appreciated 18. CVA: Cont meds 19.  Loose stools off glycolax, 20.  Hep C  ID will follow as outpt  LOS (Days) 6 A FACE TO FACE EVALUATION WAS PERFORMED  Ankit Lorie Phenix 11/12/2016, 6:46 AM

## 2016-11-13 ENCOUNTER — Inpatient Hospital Stay (HOSPITAL_COMMUNITY): Payer: Self-pay | Admitting: Occupational Therapy

## 2016-11-13 ENCOUNTER — Telehealth: Payer: Self-pay | Admitting: *Deleted

## 2016-11-13 LAB — GLUCOSE, CAPILLARY: GLUCOSE-CAPILLARY: 305 mg/dL — AB (ref 65–99)

## 2016-11-13 NOTE — Telephone Encounter (Addendum)
Transitional Care call-1st attempt unsuccessful.  Numbers listed were not working. Unable to leave message.  Multiple calls to all numbers and EC have resulted in an inability to make contact. Appointment will be changed to Hospital follow up and will be dependent on her receiving mailed information     Appointment time Tuesday 11/21/16 @ 2:30  Arrive by 2:00 for appt with Dr Letta Pate [address]

## 2016-11-14 ENCOUNTER — Telehealth: Payer: Self-pay | Admitting: Physical Medicine and Rehabilitation

## 2016-11-14 NOTE — Telephone Encounter (Signed)
Oxybutynin ER not covered by medicaid. Called [facility] and changed Rx to oxybutynin 5 mg  1/2 tab bid # 30 with one refill

## 2016-11-21 ENCOUNTER — Encounter: Payer: Medicaid Other | Attending: Physical Medicine & Rehabilitation

## 2016-11-21 ENCOUNTER — Inpatient Hospital Stay: Payer: Self-pay | Admitting: Physical Medicine & Rehabilitation

## 2016-12-20 ENCOUNTER — Telehealth: Payer: Self-pay | Admitting: Neurology

## 2016-12-20 ENCOUNTER — Ambulatory Visit: Payer: Self-pay | Admitting: Neurology

## 2016-12-20 NOTE — Telephone Encounter (Signed)
This patient did not show for a new patient appointment today. This is a third no-show for the patient, the patient will be discharged from our practice.

## 2016-12-21 ENCOUNTER — Inpatient Hospital Stay: Payer: Self-pay | Admitting: Internal Medicine

## 2016-12-25 ENCOUNTER — Encounter: Payer: Self-pay | Admitting: Neurology

## 2017-01-01 ENCOUNTER — Inpatient Hospital Stay: Payer: Self-pay | Admitting: Physical Medicine & Rehabilitation

## 2017-01-29 ENCOUNTER — Inpatient Hospital Stay: Payer: Self-pay | Admitting: Internal Medicine

## 2017-02-21 ENCOUNTER — Encounter: Payer: Self-pay | Admitting: Internal Medicine

## 2017-03-27 ENCOUNTER — Ambulatory Visit (INDEPENDENT_AMBULATORY_CARE_PROVIDER_SITE_OTHER): Payer: Medicaid Other | Admitting: Internal Medicine

## 2017-03-27 ENCOUNTER — Ambulatory Visit: Payer: Self-pay | Admitting: Internal Medicine

## 2017-03-27 VITALS — BP 172/94 | Wt 222.0 lb

## 2017-03-27 DIAGNOSIS — Z7289 Other problems related to lifestyle: Secondary | ICD-10-CM

## 2017-03-27 DIAGNOSIS — Z789 Other specified health status: Secondary | ICD-10-CM

## 2017-03-27 DIAGNOSIS — B182 Chronic viral hepatitis C: Secondary | ICD-10-CM | POA: Diagnosis present

## 2017-03-27 DIAGNOSIS — L97929 Non-pressure chronic ulcer of unspecified part of left lower leg with unspecified severity: Secondary | ICD-10-CM | POA: Diagnosis not present

## 2017-03-27 MED ORDER — AMOXICILLIN-POT CLAVULANATE 875-125 MG PO TABS: 1.0000 | ORAL_TABLET | Freq: Two times a day (BID) | ORAL | 0 refills | Status: DC

## 2017-03-27 NOTE — Progress Notes (Signed)
RFV: follow up from hospitalization from April 2018, found to have hep C  Patient ID: [name], female   DOB: 1955-08-22, 61 y.o.   MRN: [id number]  HPI 61 yo F with poorly controlled HTN, DM with hx of hospitalization in April for  HONK with encephaloapthy. Hx of  right basal ganglia infarct. During her hospitalization, she was found to have chronic hepatitis C  She is here for chronic hep C however she also has chronic open ulcer to right leg x 3 months. Off of abtx. Not seen wound care yet but has appt with vascular surgery tomorrow.   She reports that she drinks roughly 12 pack of beer per day, but does not have withdrawal and states that she does not recall intoxication causing her to miss doses  She states that she had initially injury to anterior tibia of right leg and wound has steadily increased in size. She placed antibacterial ointment on the wound. She is not currently on any abtx. She reports pain and swelling associated with right leg.   No fever or chills>   Remote hx of ivdu x 1, her ex husband used to be an  IVDU  Current meds:  - keppra 1000mg  bid remeron 30mg  qhs novolog 70/30 norvasc 10 neurontin 600qid Lisinopril hctz 20-12.5 janumet Flexeril 10mg  qhs Folic acid protonix 40 Metoprolol 25 bid  Outpatient Encounter Prescriptions as of 03/27/2017  Medication Sig  . amLODipine (NORVASC) 10 MG tablet Take 1 tablet (10 mg total) by mouth daily.  Marland Kitchen aspirin 81 MG chewable tablet Chew 1 tablet (81 mg total) by mouth daily.  Marland Kitchen atorvastatin (LIPITOR) 40 MG tablet Take 1 tablet (40 mg total) by mouth daily at 6 PM.  . folic acid (FOLVITE) 1 MG tablet Take 1 tablet (1 mg total) by mouth daily.  . insulin aspart protamine- aspart (NOVOLOG MIX 70/30) (70-30) 100 UNIT/ML injection Inject 0.22 mLs (22 Units total) into the skin 2 (two) times daily with a meal.  . levETIRAcetam (KEPPRA) 500 MG tablet Take 1 tablet (500 mg total) by mouth 2 (two) times daily.  Marland Kitchen  lisinopril (PRINIVIL,ZESTRIL) 5 MG tablet Take 3 tablets (15 mg total) by mouth 2 (two) times daily.  . metoprolol tartrate (LOPRESSOR) 25 MG tablet Take 1 tablet (25 mg total) by mouth 2 (two) times daily.  . Multiple Vitamin (MULTIVITAMIN WITH MINERALS) TABS tablet Take 1 tablet by mouth daily.  Marland Kitchen oxybutynin (DITROPAN-XL) 5 MG 24 hr tablet Take 1 tablet (5 mg total) by mouth at bedtime.  Marland Kitchen PROAIR HFA 108 (90 Base) MCG/ACT inhaler Inhale 2 puffs into the lungs 4 (four) times daily as needed for wheezing.   No facility-administered encounter medications on file as of 03/27/2017.      Patient Active Problem List   Diagnosis Date Noted  . Benign essential HTN   . Chronic hepatitis C without hepatic coma ([place])   . Labile blood glucose   . Hypoglycemia associated with type 2 diabetes mellitus ([place])   . Poorly controlled type 2 diabetes mellitus with peripheral neuropathy ([place])   . Neurologic gait disorder   . Neuropathic pain   . Type 2 diabetes mellitus with peripheral neuropathy ([facility])   . History of intracranial hemorrhage   . Diarrhea   . Urinary incontinence   . Hypokalemia   . Polysubstance abuse   . Right sided cerebral hemisphere cerebrovascular accident (CVA) ([place])   . Disorientation   . Meningitis 11/02/2016  . Type 2 diabetes  mellitus with hyperosmolar nonketotic hyperglycemia ([place]) 10/31/2016  . Seizure ([place]) 10/31/2016  . ARF (acute renal failure) ([place]) 10/31/2016  . Severe sepsis ([place]) 10/31/2016  . DKA (diabetic ketoacidoses) ([place]) 10/31/2016  . AKI (acute kidney injury) ([place])   . Right hemiparesis ([place]) 06/09/2016  . 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 Cytotoxic brain edema ([place]) 06/08/2016  . Encephalopathy acute 06/06/2016  . Chest pain, musculoskeletal 05/16/2011  . Vaginal candidiasis 05/16/2011  . Hypertension 05/16/2011  . Diabetes mellitus, type 2 ([place]) 05/16/2011  . CHEST PAIN 10/01/2009  . CELLULITIS AND ABSCESS OF LEG EXCEPT FOOT 08/31/2009  .  INSOMNIA 07/13/2008  . MRSA 04/17/2008  . NECK MASS 04/17/2008  . COCAINE ABUSE 11/01/2007  . OBESITY NOS 07/14/2006  . DENTAL CARIES 07/14/2006  . DIABETES MELLITUS, TYPE II 04/26/2006  . ALCOHOL ABUSE 04/26/2006  . TOBACCO ABUSE 04/26/2006  . DEPRESSION 04/26/2006  . Essential hypertension 04/26/2006  . GERD 04/26/2006  . LOW BACK PAIN 04/26/2006     Health Maintenance Due  Topic Date Due  . OPHTHALMOLOGY EXAM  12/04/1966  . TETANUS/TDAP  12/04/1975  . PAP SMEAR  12/03/1977  . COLONOSCOPY  12/04/2006  . FOOT EXAM  08/31/2010  . HEMOGLOBIN A1C  02/03/2017  . INFLUENZA VACCINE  02/07/2017     Review of Systems +leg wound with associated elg pain, otherwise 12 point ros is negative Physical Exam   BP (!) 172/94   Wt 222 lb (100.7 kg)   BMI 33.75 kg/m    Physical Exam  Constitutional:  oriented to person, place, and time. appears well-developed and well-nourished. No distress.  HENT: [place]/AT, PERRLA, no scleral icterus Mouth/Throat: Oropharynx is clear and moist. No oropharyngeal exudate.  Cardiovascular: Normal rate, regular rhythm and normal heart sounds. Exam reveals no gallop and no friction rub.  No murmur heard.  Pulmonary/Chest: Effort normal and breath sounds normal. No respiratory distress.  has no wheezes.  Neck = supple, no nuchal rigidity Abdominal: Soft. Bowel sounds are normal.  exhibits no distension. There is no tenderness.  Lymphadenopathy: no cervical adenopathy. No axillary adenopathy Skin = large shallow ulcer with fibrinous exudate in the wound bed of anterior tibia of right leg Ext: pitting edema, mild erythema, and warmth associated iwht ulcer edge Psychiatric: a normal mood and affect.  behavior is normal.   Lab Results  Component Value Date   LABRPR NON REACTIVE 05/15/2011    CBC Lab Results  Component Value Date   WBC 8.9 11/07/2016   RBC 4.12 11/07/2016   HGB 12.5 11/07/2016   HCT 37.8 11/07/2016   PLT 203 11/07/2016   MCV 91.7  11/07/2016   MCH 30.3 11/07/2016   MCHC 33.1 11/07/2016   RDW 13.8 11/07/2016   LYMPHSABS 2.8 11/07/2016   MONOABS 1.0 11/07/2016   EOSABS 0.3 11/07/2016    BMET Lab Results  Component Value Date   NA 140 11/07/2016   K 4.2 11/07/2016   CL 114 (H) 11/07/2016   CO2 20 (L) 11/07/2016   GLUCOSE 206 (H) 11/07/2016   BUN 6 11/07/2016   CREATININE 1.18 (H) 11/07/2016   CALCIUM 8.5 (L) 11/07/2016   GFRNONAA 49 (L) 11/07/2016   GFRAA 57 (L) 11/07/2016      Assessment and Plan  Chronic hep c = will get labs to discuss treatment option after leg ulcer heals.   Chronic leg ulcer = concern that is is associated with infection/cellulitis - we will do course of amox/clav to see if improvement and send  to wound care. Has appt with vascular team tomorrow  Right leg swelling = likely from infection, but also will need evaluation by vascular surgery  etoh use = recommend to decrease her 12 pack per night down to 8-10 cans over the next 3-4 wk. Need for her to start counseling with ADS counselor and AA meetings in order to qualify for coverage through medicaid.

## 2017-03-28 ENCOUNTER — Other Ambulatory Visit (HOSPITAL_COMMUNITY): Payer: Self-pay | Admitting: Internal Medicine

## 2017-03-28 ENCOUNTER — Encounter (HOSPITAL_COMMUNITY): Payer: Self-pay

## 2017-03-28 DIAGNOSIS — L97909 Non-pressure chronic ulcer of unspecified part of unspecified lower leg with unspecified severity: Principal | ICD-10-CM

## 2017-03-28 DIAGNOSIS — I83009 Varicose veins of unspecified lower extremity with ulcer of unspecified site: Secondary | ICD-10-CM

## 2017-03-30 LAB — COMPLETE METABOLIC PANEL WITH GFR
AG RATIO: 0.8 (calc) — AB (ref 1.0–2.5)
ALBUMIN MSPROF: 3.3 g/dL — AB (ref 3.6–5.1)
ALT: 8 U/L (ref 6–29)
AST: 14 U/L (ref 10–35)
Alkaline phosphatase (APISO): 49 U/L (ref 33–130)
BILIRUBIN TOTAL: 0.2 mg/dL (ref 0.2–1.2)
BUN/Creatinine Ratio: 18 (calc) (ref 6–22)
BUN: 23 mg/dL (ref 7–25)
CHLORIDE: 106 mmol/L (ref 98–110)
CO2: 17 mmol/L — ABNORMAL LOW (ref 20–32)
Calcium: 9 mg/dL (ref 8.6–10.4)
Creat: 1.28 mg/dL — ABNORMAL HIGH (ref 0.50–0.99)
GFR, EST AFRICAN AMERICAN: 53 mL/min/{1.73_m2} — AB (ref >=60)
GFR, Est Non African American: 45 mL/min/{1.73_m2} — ABNORMAL LOW (ref >=60)
GLOBULIN: 3.9 g/dL — AB (ref 1.9–3.7)
Glucose, Bld: 331 mg/dL — ABNORMAL HIGH (ref 65–99)
POTASSIUM: 4.4 mmol/L (ref 3.5–5.3)
Sodium: 136 mmol/L (ref 135–146)
TOTAL PROTEIN: 7.2 g/dL (ref 6.1–8.1)

## 2017-03-30 LAB — CBC WITH DIFFERENTIAL/PLATELET
Basophils Absolute: 39 cells/uL (ref 0–200)
Basophils Relative: 0.4 %
EOS PCT: 1.4 %
Eosinophils Absolute: 136 cells/uL (ref 15–500)
HCT: 38.5 % (ref 35.0–45.0)
Hemoglobin: 12.7 g/dL (ref 11.7–15.5)
LYMPHS ABS: 2765 {cells}/uL (ref 850–3900)
MCH: 30.9 pg (ref 27.0–33.0)
MCHC: 33 g/dL (ref 32.0–36.0)
MCV: 93.7 fL (ref 80.0–100.0)
MPV: 11.5 fL (ref 7.5–12.5)
Monocytes Relative: 5.9 %
NEUTROS ABS: 6189 {cells}/uL (ref 1500–7800)
Neutrophils Relative %: 63.8 %
PLATELETS: 309 10*3/uL (ref 140–400)
RBC: 4.11 10*6/uL (ref 3.80–5.10)
RDW: 12.8 % (ref 11.0–15.0)
Total Lymphocyte: 28.5 %
WBC mixed population: 572 cells/uL (ref 200–950)
WBC: 9.7 10*3/uL (ref 3.8–10.8)

## 2017-03-30 LAB — HEPATITIS B SURFACE ANTIBODY,QUALITATIVE: HEP B S AB: REACTIVE — AB

## 2017-03-30 LAB — HIV ANTIBODY (ROUTINE TESTING W REFLEX): HIV: NONREACTIVE

## 2017-03-30 LAB — HEPATITIS A ANTIBODY, TOTAL: Hepatitis A AB,Total: REACTIVE — AB

## 2017-03-30 LAB — HEPATITIS C GENOTYPE: HCV Genotype: 2

## 2017-04-09 ENCOUNTER — Inpatient Hospital Stay (HOSPITAL_COMMUNITY): Admission: RE | Admit: 2017-04-09 | Payer: Self-pay | Source: Ambulatory Visit

## 2017-04-18 ENCOUNTER — Encounter (HOSPITAL_COMMUNITY): Payer: Self-pay

## 2017-04-19 ENCOUNTER — Ambulatory Visit: Payer: Self-pay | Admitting: Internal Medicine

## 2017-04-23 ENCOUNTER — Encounter (HOSPITAL_BASED_OUTPATIENT_CLINIC_OR_DEPARTMENT_OTHER): Payer: Medicaid Other

## 2017-04-24 ENCOUNTER — Ambulatory Visit (HOSPITAL_COMMUNITY)
Admission: RE | Admit: 2017-04-24 | Discharge: 2017-04-24 | Disposition: A | Discharge status: Home / Self Care | Payer: Medicaid Other | Source: Ambulatory Visit | Attending: Internal Medicine | Admitting: Internal Medicine

## 2017-04-24 DIAGNOSIS — Z720 Tobacco use: Secondary | ICD-10-CM | POA: Diagnosis not present

## 2017-04-24 DIAGNOSIS — L97909 Non-pressure chronic ulcer of unspecified part of unspecified lower leg with unspecified severity: Secondary | ICD-10-CM | POA: Diagnosis not present

## 2017-04-24 DIAGNOSIS — I83009 Varicose veins of unspecified lower extremity with ulcer of unspecified site: Secondary | ICD-10-CM | POA: Diagnosis not present

## 2017-04-24 DIAGNOSIS — I10 Essential (primary) hypertension: Secondary | ICD-10-CM | POA: Insufficient documentation

## 2017-04-24 DIAGNOSIS — E785 Hyperlipidemia, unspecified: Secondary | ICD-10-CM | POA: Diagnosis not present

## 2017-04-24 DIAGNOSIS — E1151 Type 2 diabetes mellitus with diabetic peripheral angiopathy without gangrene: Secondary | ICD-10-CM | POA: Diagnosis not present

## 2017-04-24 DIAGNOSIS — R9389 Abnormal findings on diagnostic imaging of other specified body structures: Secondary | ICD-10-CM | POA: Insufficient documentation

## 2017-05-10 ENCOUNTER — Inpatient Hospital Stay (HOSPITAL_COMMUNITY): Payer: Medicaid Other

## 2017-05-10 ENCOUNTER — Encounter (HOSPITAL_COMMUNITY): Payer: Self-pay

## 2017-05-10 ENCOUNTER — Emergency Department (HOSPITAL_COMMUNITY): Payer: Medicaid Other

## 2017-05-10 ENCOUNTER — Inpatient Hospital Stay (HOSPITAL_COMMUNITY)
Admission: EM | Admit: 2017-05-10 | Discharge: 2017-05-15 | DRG: 871 | Disposition: A | Discharge status: Home Health | Payer: Medicaid Other | Attending: Internal Medicine | Admitting: Internal Medicine

## 2017-05-10 ENCOUNTER — Other Ambulatory Visit (HOSPITAL_COMMUNITY): Payer: Self-pay

## 2017-05-10 DIAGNOSIS — E1010 Type 1 diabetes mellitus with ketoacidosis without coma: Secondary | ICD-10-CM

## 2017-05-10 DIAGNOSIS — Z7982 Long term (current) use of aspirin: Secondary | ICD-10-CM

## 2017-05-10 DIAGNOSIS — Z8673 Personal history of transient ischemic attack (TIA), and cerebral infarction without residual deficits: Secondary | ICD-10-CM | POA: Diagnosis not present

## 2017-05-10 DIAGNOSIS — Z823 Family history of stroke: Secondary | ICD-10-CM

## 2017-05-10 DIAGNOSIS — E1110 Type 2 diabetes mellitus with ketoacidosis without coma: Secondary | ICD-10-CM

## 2017-05-10 DIAGNOSIS — G9341 Metabolic encephalopathy: Secondary | ICD-10-CM | POA: Diagnosis not present

## 2017-05-10 DIAGNOSIS — E875 Hyperkalemia: Secondary | ICD-10-CM | POA: Diagnosis not present

## 2017-05-10 DIAGNOSIS — R402252 Coma scale, best verbal response, oriented, at arrival to emergency department: Secondary | ICD-10-CM | POA: Diagnosis present

## 2017-05-10 DIAGNOSIS — R059 Cough, unspecified: Secondary | ICD-10-CM

## 2017-05-10 DIAGNOSIS — Z833 Family history of diabetes mellitus: Secondary | ICD-10-CM | POA: Diagnosis not present

## 2017-05-10 DIAGNOSIS — R410 Disorientation, unspecified: Secondary | ICD-10-CM | POA: Diagnosis not present

## 2017-05-10 DIAGNOSIS — Z9119 Patient's noncompliance with other medical treatment and regimen: Secondary | ICD-10-CM

## 2017-05-10 DIAGNOSIS — I69951 Hemiplegia and hemiparesis following unspecified cerebrovascular disease affecting right dominant side: Secondary | ICD-10-CM

## 2017-05-10 DIAGNOSIS — E0810 Diabetes mellitus due to underlying condition with ketoacidosis without coma: Secondary | ICD-10-CM

## 2017-05-10 DIAGNOSIS — R402362 Coma scale, best motor response, obeys commands, at arrival to emergency department: Secondary | ICD-10-CM | POA: Diagnosis present

## 2017-05-10 DIAGNOSIS — F1910 Other psychoactive substance abuse, uncomplicated: Secondary | ICD-10-CM

## 2017-05-10 DIAGNOSIS — R402142 Coma scale, eyes open, spontaneous, at arrival to emergency department: Secondary | ICD-10-CM | POA: Diagnosis present

## 2017-05-10 DIAGNOSIS — N179 Acute kidney failure, unspecified: Secondary | ICD-10-CM | POA: Diagnosis not present

## 2017-05-10 DIAGNOSIS — Z9114 Patient's other noncompliance with medication regimen: Secondary | ICD-10-CM

## 2017-05-10 DIAGNOSIS — G92 Toxic encephalopathy: Secondary | ICD-10-CM | POA: Diagnosis present

## 2017-05-10 DIAGNOSIS — A419 Sepsis, unspecified organism: Principal | ICD-10-CM | POA: Diagnosis present

## 2017-05-10 DIAGNOSIS — E785 Hyperlipidemia, unspecified: Secondary | ICD-10-CM | POA: Diagnosis present

## 2017-05-10 DIAGNOSIS — F1410 Cocaine abuse, uncomplicated: Secondary | ICD-10-CM | POA: Diagnosis present

## 2017-05-10 DIAGNOSIS — E860 Dehydration: Secondary | ICD-10-CM | POA: Diagnosis present

## 2017-05-10 DIAGNOSIS — E1122 Type 2 diabetes mellitus with diabetic chronic kidney disease: Secondary | ICD-10-CM | POA: Diagnosis present

## 2017-05-10 DIAGNOSIS — R5381 Other malaise: Secondary | ICD-10-CM | POA: Diagnosis not present

## 2017-05-10 DIAGNOSIS — I129 Hypertensive chronic kidney disease with stage 1 through stage 4 chronic kidney disease, or unspecified chronic kidney disease: Secondary | ICD-10-CM | POA: Diagnosis present

## 2017-05-10 DIAGNOSIS — F1490 Cocaine use, unspecified, uncomplicated: Secondary | ICD-10-CM | POA: Diagnosis not present

## 2017-05-10 DIAGNOSIS — I10 Essential (primary) hypertension: Secondary | ICD-10-CM | POA: Diagnosis not present

## 2017-05-10 DIAGNOSIS — Z720 Tobacco use: Secondary | ICD-10-CM | POA: Diagnosis not present

## 2017-05-10 DIAGNOSIS — E1142 Type 2 diabetes mellitus with diabetic polyneuropathy: Secondary | ICD-10-CM | POA: Diagnosis present

## 2017-05-10 DIAGNOSIS — R112 Nausea with vomiting, unspecified: Secondary | ICD-10-CM | POA: Diagnosis present

## 2017-05-10 DIAGNOSIS — J690 Pneumonitis due to inhalation of food and vomit: Secondary | ICD-10-CM | POA: Diagnosis not present

## 2017-05-10 DIAGNOSIS — Z794 Long term (current) use of insulin: Secondary | ICD-10-CM | POA: Diagnosis not present

## 2017-05-10 DIAGNOSIS — F1010 Alcohol abuse, uncomplicated: Secondary | ICD-10-CM | POA: Diagnosis not present

## 2017-05-10 DIAGNOSIS — N183 Chronic kidney disease, stage 3 (moderate): Secondary | ICD-10-CM | POA: Diagnosis present

## 2017-05-10 DIAGNOSIS — F17210 Nicotine dependence, cigarettes, uncomplicated: Secondary | ICD-10-CM | POA: Diagnosis present

## 2017-05-10 DIAGNOSIS — N189 Chronic kidney disease, unspecified: Secondary | ICD-10-CM | POA: Diagnosis not present

## 2017-05-10 DIAGNOSIS — R05 Cough: Secondary | ICD-10-CM

## 2017-05-10 DIAGNOSIS — G4700 Insomnia, unspecified: Secondary | ICD-10-CM | POA: Diagnosis present

## 2017-05-10 DIAGNOSIS — E871 Hypo-osmolality and hyponatremia: Secondary | ICD-10-CM | POA: Diagnosis not present

## 2017-05-10 LAB — BASIC METABOLIC PANEL
ANION GAP: 32 — AB (ref 5–15)
Anion gap: 24 — ABNORMAL HIGH (ref 5–15)
Anion gap: 15 (ref 5–15)
BUN: 52 mg/dL — ABNORMAL HIGH (ref 6–20)
BUN: 50 mg/dL — AB (ref 6–20)
BUN: 48 mg/dL — ABNORMAL HIGH (ref 6–20)
CALCIUM: 8.8 mg/dL — AB (ref 8.9–10.3)
CALCIUM: 9.2 mg/dL (ref 8.9–10.3)
CHLORIDE: 83 mmol/L — AB (ref 101–111)
CHLORIDE: 105 mmol/L (ref 101–111)
CO2: 10 mmol/L — AB (ref 22–32)
CO2: 17 mmol/L — AB (ref 22–32)
CO2: 23 mmol/L (ref 22–32)
CREATININE: 2.6 mg/dL — AB (ref 0.44–1.00)
Calcium: 8.7 mg/dL — ABNORMAL LOW (ref 8.9–10.3)
Chloride: 96 mmol/L — ABNORMAL LOW (ref 101–111)
Creatinine, Ser: 3.56 mg/dL — ABNORMAL HIGH (ref 0.44–1.00)
Creatinine, Ser: 3.13 mg/dL — ABNORMAL HIGH (ref 0.44–1.00)
GFR calc Af Amer: 15 mL/min — ABNORMAL LOW (ref >60)
GFR calc Af Amer: 17 mL/min — ABNORMAL LOW (ref >60)
GFR calc non Af Amer: 13 mL/min — ABNORMAL LOW (ref >60)
GFR calc non Af Amer: 15 mL/min — ABNORMAL LOW (ref >60)
GFR calc non Af Amer: 19 mL/min — ABNORMAL LOW (ref >60)
GFR, EST AFRICAN AMERICAN: 22 mL/min — AB (ref >60)
GLUCOSE: 1112 mg/dL — AB (ref 65–99)
Glucose, Bld: 553 mg/dL (ref 65–99)
Glucose, Bld: 247 mg/dL — ABNORMAL HIGH (ref 65–99)
POTASSIUM: 3.5 mmol/L (ref 3.5–5.1)
Potassium: 5.8 mmol/L — ABNORMAL HIGH (ref 3.5–5.1)
Potassium: 4 mmol/L (ref 3.5–5.1)
SODIUM: 137 mmol/L (ref 135–145)
SODIUM: 143 mmol/L (ref 135–145)
Sodium: 125 mmol/L — ABNORMAL LOW (ref 135–145)

## 2017-05-10 LAB — CBC WITH DIFFERENTIAL/PLATELET
Basophils Absolute: 0 10*3/uL (ref 0.0–0.1)
Basophils Relative: 0 %
Eosinophils Absolute: 0 10*3/uL (ref 0.0–0.7)
Eosinophils Relative: 0 %
HEMATOCRIT: 38.4 % (ref 36.0–46.0)
HEMOGLOBIN: 12.4 g/dL (ref 12.0–15.0)
LYMPHS PCT: 9 %
Lymphs Abs: 1.5 10*3/uL (ref 0.7–4.0)
MCH: 31.6 pg (ref 26.0–34.0)
MCHC: 32.3 g/dL (ref 30.0–36.0)
MCV: 97.7 fL (ref 78.0–100.0)
MONO ABS: 1.2 10*3/uL — AB (ref 0.1–1.0)
MONOS PCT: 7 %
NEUTROS ABS: 13.2 10*3/uL — AB (ref 1.7–7.7)
Neutrophils Relative %: 84 %
Platelets: 358 10*3/uL (ref 150–400)
RBC: 3.93 MIL/uL (ref 3.87–5.11)
RDW: 13 % (ref 11.5–15.5)
WBC: 15.8 10*3/uL — ABNORMAL HIGH (ref 4.0–10.5)

## 2017-05-10 LAB — MRSA PCR SCREENING: MRSA BY PCR: NEGATIVE

## 2017-05-10 LAB — HEPATIC FUNCTION PANEL
ALK PHOS: 70 U/L (ref 38–126)
ALT: 15 U/L (ref 14–54)
AST: 19 U/L (ref 15–41)
Albumin: 3.4 g/dL — ABNORMAL LOW (ref 3.5–5.0)
BILIRUBIN TOTAL: 1.2 mg/dL (ref 0.3–1.2)
Bilirubin, Direct: <0.1 mg/dL — ABNORMAL LOW (ref 0.1–0.5)
TOTAL PROTEIN: 9.4 g/dL — AB (ref 6.5–8.1)

## 2017-05-10 LAB — URINALYSIS, ROUTINE W REFLEX MICROSCOPIC
BILIRUBIN URINE: NEGATIVE
Glucose, UA: >=500 mg/dL — AB
KETONES UR: 80 mg/dL — AB
Nitrite: NEGATIVE
PH: 5 (ref 5.0–8.0)
PROTEIN: 100 mg/dL — AB
Specific Gravity, Urine: 1.016 (ref 1.005–1.030)

## 2017-05-10 LAB — BLOOD GAS, ARTERIAL
Acid-base deficit: 16.2 mmol/L — ABNORMAL HIGH (ref 0.0–2.0)
BICARBONATE: 9.4 mmol/L — AB (ref 20.0–28.0)
FIO2: 0.21
O2 Saturation: 92.8 %
PCO2 ART: 21.9 mmHg — AB (ref 32.0–48.0)
PH ART: 7.256 — AB (ref 7.350–7.450)
PO2 ART: 77.8 mmHg — AB (ref 83.0–108.0)
Patient temperature: 98.6

## 2017-05-10 LAB — TYPE AND SCREEN
ABO/RH(D): O POS
Antibody Screen: NEGATIVE

## 2017-05-10 LAB — PROTIME-INR
INR: 1.11
Prothrombin Time: 14.2 seconds (ref 11.4–15.2)

## 2017-05-10 LAB — RAPID URINE DRUG SCREEN, HOSP PERFORMED
Amphetamines: NOT DETECTED
BARBITURATES: NOT DETECTED
BENZODIAZEPINES: NOT DETECTED
COCAINE: POSITIVE — AB
OPIATES: NOT DETECTED
Tetrahydrocannabinol: NOT DETECTED

## 2017-05-10 LAB — I-STAT CG4 LACTIC ACID, ED: Lactic Acid, Venous: 2.37 mmol/L (ref 0.5–1.9)

## 2017-05-10 LAB — OCCULT BLOOD GASTRIC / DUODENUM (SPECIMEN CUP)
Occult Blood, Gastric: POSITIVE — AB
PH, GASTRIC: 3

## 2017-05-10 LAB — CBG MONITORING, ED
Glucose-Capillary: >600 mg/dL (ref 65–99)
Glucose-Capillary: >600 mg/dL (ref 65–99)
Glucose-Capillary: >600 mg/dL (ref 65–99)

## 2017-05-10 LAB — GLUCOSE, CAPILLARY
GLUCOSE-CAPILLARY: 531 mg/dL — AB (ref 65–99)
GLUCOSE-CAPILLARY: 376 mg/dL — AB (ref 65–99)
GLUCOSE-CAPILLARY: 245 mg/dL — AB (ref 65–99)
Glucose-Capillary: 266 mg/dL — ABNORMAL HIGH (ref 65–99)

## 2017-05-10 LAB — LACTIC ACID, PLASMA: Lactic Acid, Venous: 3 mmol/L (ref 0.5–1.9)

## 2017-05-10 LAB — ABO/RH: ABO/RH(D): O POS

## 2017-05-10 MED ORDER — ATORVASTATIN CALCIUM 40 MG PO TABS
40.0000 mg | ORAL_TABLET | Freq: Every day | ORAL | Status: DC
Start: 2017-05-10 — End: 2017-05-15
  Administered 2017-05-11 – 2017-05-15 (×5): 40 mg via ORAL
  Filled 2017-05-10 (×6): qty 1

## 2017-05-10 MED ORDER — PANTOPRAZOLE SODIUM 40 MG IV SOLR
40.0000 mg | Freq: Two times a day (BID) | INTRAVENOUS | Status: DC
  Administered 2017-05-10 – 2017-05-14 (×8): 40 mg via INTRAVENOUS
  Filled 2017-05-10 (×7): qty 40

## 2017-05-10 MED ORDER — SODIUM CHLORIDE 0.9 % IV BOLUS (SEPSIS)
1000.0000 mL | Freq: Once | INTRAVENOUS | Status: AC
  Administered 2017-05-10: 1000 mL via INTRAVENOUS

## 2017-05-10 MED ORDER — ONDANSETRON HCL 4 MG/2ML IJ SOLN
4.0000 mg | Freq: Four times a day (QID) | INTRAMUSCULAR | Status: DC | PRN
  Administered 2017-05-10: 4 mg via INTRAVENOUS

## 2017-05-10 MED ORDER — SODIUM CHLORIDE 0.9 % IV SOLN
500.0000 mg | Freq: Two times a day (BID) | INTRAVENOUS | Status: DC
  Administered 2017-05-10 – 2017-05-13 (×7): 500 mg via INTRAVENOUS
  Filled 2017-05-10 (×9): qty 5

## 2017-05-10 MED ORDER — DEXTROSE-NACL 5-0.45 % IV SOLN
INTRAVENOUS | Status: DC
  Administered 2017-05-10: 50 mL via INTRAVENOUS

## 2017-05-10 MED ORDER — DEXTROSE 5 % IV SOLN: 500.0000 mg | Freq: Once | INTRAVENOUS | Status: DC

## 2017-05-10 MED ORDER — LORAZEPAM 2 MG/ML IJ SOLN
1.0000 mg | Freq: Once | INTRAMUSCULAR | Status: AC
  Administered 2017-05-10: 1 mg via INTRAVENOUS
  Filled 2017-05-10: qty 1

## 2017-05-10 MED ORDER — NICOTINE 21 MG/24HR TD PT24
21.0000 mg | MEDICATED_PATCH | TRANSDERMAL | Status: DC
  Administered 2017-05-10 – 2017-05-14 (×5): 21 mg via TRANSDERMAL
  Filled 2017-05-10 (×5): qty 1

## 2017-05-10 MED ORDER — NYSTATIN 100000 UNIT/GM EX POWD
Freq: Two times a day (BID) | CUTANEOUS | Status: DC
  Administered 2017-05-10 – 2017-05-15 (×10): via TOPICAL
  Filled 2017-05-10 (×2): qty 15

## 2017-05-10 MED ORDER — DEXTROSE 5 % IV SOLN: 1.0000 g | Freq: Once | INTRAVENOUS | Status: DC

## 2017-05-10 MED ORDER — SODIUM CHLORIDE 0.9 % IV BOLUS (SEPSIS): 1000.0000 mL | Freq: Once | INTRAVENOUS | Status: DC

## 2017-05-10 MED ORDER — ONDANSETRON HCL 4 MG/2ML IJ SOLN
INTRAMUSCULAR | Status: AC
  Filled 2017-05-10: qty 2

## 2017-05-10 MED ORDER — DEXTROSE 5 % IV SOLN
500.0000 mg | INTRAVENOUS | Status: DC
  Administered 2017-05-10: 500 mg via INTRAVENOUS
  Filled 2017-05-10: qty 500

## 2017-05-10 MED ORDER — DEXTROSE 5 % IV SOLN
1.0000 g | INTRAVENOUS | Status: DC
  Administered 2017-05-10: 1 g via INTRAVENOUS
  Filled 2017-05-10: qty 10

## 2017-05-10 MED ORDER — SODIUM CHLORIDE 0.9 % IV SOLN
INTRAVENOUS | Status: AC
  Administered 2017-05-10 – 2017-05-14 (×3): via INTRAVENOUS

## 2017-05-10 MED ORDER — SODIUM CHLORIDE 0.9 % IV SOLN: INTRAVENOUS | Status: DC

## 2017-05-10 MED ORDER — SODIUM CHLORIDE 0.9 % IV SOLN
INTRAVENOUS | Status: DC
  Administered 2017-05-10: 5.4 [IU]/h via INTRAVENOUS
  Administered 2017-05-11: 02:00:00 via INTRAVENOUS
  Filled 2017-05-10 (×2): qty 1

## 2017-05-10 MED ORDER — AMPICILLIN-SULBACTAM SODIUM 3 (2-1) G IJ SOLR
3.0000 g | Freq: Two times a day (BID) | INTRAMUSCULAR | Status: DC
  Administered 2017-05-10 – 2017-05-12 (×5): 3 g via INTRAVENOUS
  Filled 2017-05-10 (×6): qty 3

## 2017-05-10 NOTE — ED Notes (Signed)
Pt transported to x-ray. 1x unsuccessful IV attempt

## 2017-05-10 NOTE — ED Notes (Addendum)
Tiffany Iverson-daughter [phone]

## 2017-05-10 NOTE — Progress Notes (Signed)
Pharmacy Antibiotic Note  [name] is a 61 y.o. female admitted on 05/10/2017 with aspiration PNA.  Pharmacy has been consulted for Unasyn dosing.  Plan: Unasyn 3gm q12 SCr elevated with DKA, will adjust Unasyn schedule with improved renal function  Height: 5\' 8"  (172.7 cm) Weight: 222 lb (100.7 kg) IBW/kg (Calculated) : 63.9  Temp (24hrs), Avg:99.1 F (37.3 C), Min:98.5 F (36.9 C), Max:99.7 F (37.6 C)   Recent Labs Lab 05/10/17 1244 05/10/17 1421  WBC 15.8*  --   CREATININE 3.56*  --   LATICACIDVEN  --  2.37*    Estimated Creatinine Clearance: 20.9 mL/min (A) (by C-G formula based on SCr of 3.56 mg/dL (H)).    No Known Allergies  Antimicrobials this admission: 11/1 Ceftriaxone/Azithromycin  x1 in ED 11/1 Unasyn >>   Dose adjustments this admission:  Microbiology results: 11/1 BCx: sent 11/1 UDS: + cocaine        MRSA PCR: ordered  Thank you for allowing pharmacy to be a part of this patient's care.  Minda Ditto 05/10/2017 5:45 PM

## 2017-05-10 NOTE — H&P (Addendum)
History and Physical  [name] XVQ:[id number] DOB: June 10, 1956 DOA: 05/10/2017  Referring physician: EDP PCP: Nolene Ebbs, MD   Chief Complaint: Nausea /vomiting, elevated blood sugar, confusion  HPI: [name] is a 61 y.o. 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  She is sent from home by EMS due to the above complaints.  patient currently is very altered and not able to provide any history, not following command.  HPI obtained from chart review,  talking to EDP/RN and the caregiver  Shireen Quan over the [phone] 1950 .  per ED physician, patient initially alert and oriented. she is found to be in DKA, dehydration, aki and pneumonia with lactic acidosis, significant sinus tachycardia. uds + cocaine. she is started with IV fluids, insulin drip and treated with Rocephin and Zithromax.  due to significant lab abnormality with blood sugar around 1000 and  elevated lactic acidosis, significant sinus tachycardia. critical care consulted  who recommended hospitalist admission.  When I went to examine the patient, patient has left gaze, she does not follow commands. she vomited with coffee-ground emesis.   Per Caregiver patient lives with disabled husband. patient has not been able to walk recently. She has several falls with no apparent  injuries.  patient has a personal caregiver a few hours a day.   Review of Systems:  Detail per HPI, Review of systems are otherwise negative  Past Medical History:  Diagnosis Date  . Cocaine abuse, continuous ([place])   . Diabetes mellitus    Hb A1C = 12.6 on 05/15/11, managed on Novolog 70/30, 35 U qam, 25 U qpm  . Hypertension    poorly controlled  . Insomnia disorder   .  Shortness of breath    Past Surgical History:  Procedure Laterality Date  . ANKLE FRACTURE SURGERY  2007   Social History:  reports that she has been smoking Cigarettes.  She has a 37.50 pack-year smoking history. She has never used smokeless tobacco. She reports that she drinks about 4.8 oz of alcohol per week . She reports that she uses drugs, including Cocaine. Patient lives at home with disabled husband & she does not drive, she does not walk much. Had PCS taking care of ADLs.  No Known Allergies  Family History  Problem Relation Age of Onset  . Stroke Father   . Diabetes Sister   . Diabetes Brother       Prior to Admission medications   Medication Sig Start Date End Date Taking? Authorizing Provider  amLODipine (NORVASC) 10 MG tablet Take 1 tablet (10 mg total) by mouth daily. 11/10/16  Yes Love, Ivan Anchors, PA-C  aspirin 81 MG chewable tablet Chew 1 tablet (81 mg total) by mouth daily. 11/07/16  Yes Rosita Fire, MD  atorvastatin (LIPITOR) 40 MG tablet Take 1 tablet (40 mg total) by mouth daily at 6 PM. 11/10/16  Yes Love, Ivan Anchors, PA-C  cyclobenzaprine (FLEXERIL) 10 MG tablet Take 10 mg by mouth at bedtime.   Yes [provider]  folic acid (FOLVITE) 1 MG tablet Take 1 tablet (1 mg total) by mouth daily. 11/10/16  Yes Love, Ivan Anchors, PA-C  insulin aspart protamine- aspart (NOVOLOG MIX 70/30) (70-30) 100 UNIT/ML injection Inject 0.22 mLs (22 Units total) into the  skin 2 (two) times daily with a meal. 11/12/16  Yes Patel, Domenick Bookbinder, MD  metoprolol tartrate (LOPRESSOR) 25 MG tablet Take 1 tablet (25 mg total) by mouth 2 (two) times daily. 11/10/16  Yes Love, Ivan Anchors, PA-C  Multiple Vitamin (MULTIVITAMIN WITH MINERALS) TABS tablet Take 1 tablet by mouth daily. 06/20/16  Yes Angiulli, Lavon Paganini, PA-C  oxybutynin (DITROPAN-XL) 5 MG 24 hr tablet Take 1 tablet (5 mg total) by mouth at bedtime. 11/10/16  Yes Love, Ivan Anchors, PA-C  simvastatin (ZOCOR) 10 MG tablet Take 10 mg by mouth  daily.   Yes [provider]  amoxicillin-clavulanate (AUGMENTIN) 875-125 MG tablet Take 1 tablet by mouth 2 (two) times daily. Patient not taking: Reported on 05/10/2017 03/27/17   Carlyle Basques, MD  furosemide (LASIX) 20 MG tablet Take 20 mg by mouth daily. 03/21/17   [provider]  gabapentin (NEURONTIN) 300 MG capsule Take 600 mg by mouth 4 (four) times daily. 05/07/17   [provider]  levETIRAcetam (KEPPRA) 500 MG tablet Take 1 tablet (500 mg total) by mouth 2 (two) times daily. 11/10/16   Love, Ivan Anchors, PA-C  lisinopril (PRINIVIL,ZESTRIL) 5 MG tablet Take 3 tablets (15 mg total) by mouth 2 (two) times daily. 11/12/16 12/12/16  Jamse Arn, MD  PROAIR HFA 108 [phone] Base) MCG/ACT inhaler Inhale 2 puffs into the lungs 4 (four) times daily as needed for wheezing. 10/18/16   [provider]  traMADol (ULTRAM) 50 MG tablet Take 50 mg by mouth daily as needed for pain. 03/23/17   [provider]    Physical Exam: BP 133/75   Pulse (!) 134   Temp 99.7 F (37.6 C) (Rectal)   Resp (!) 21   Ht 5\' 8"  (1.727 m)   Wt 100.7 kg (222 lb)   SpO2 95%   BMI 33.75 kg/m   General:  Know she is in the hospital, states she is at [facility] [place]. She is not oriented to time, she seems to have right sided gaze. She does not follow commands Eyes: right side gaze ENT: dry oral mucosa Neck: supple, no JVD Cardiovascular: sinus tachycardia Respiratory: diminished at basis, no wheezing Abdomen: soft/NT/ND, positive bowel sounds Skin: intertriginous groin candida?, right lower extremity anterior tibia, chronic skin ulcer , does not appear to be actively infected, no odor, no drainage. Musculoskeletal:  No edema Psychiatric: confused,  Neurologic: confused          Labs on Admission:  Basic Metabolic Panel:  Recent Labs Lab 05/10/17 1244  NA 125*  K 5.8*  CL 83*  CO2 10*  GLUCOSE 1,112*  BUN 52*  CREATININE 3.56*  CALCIUM 8.8*   Liver Function  Tests: No results for input(s): AST, ALT, ALKPHOS, BILITOT, PROT, ALBUMIN in the last 168 hours. No results for input(s): LIPASE, AMYLASE in the last 168 hours. No results for input(s): AMMONIA in the last 168 hours. CBC:  Recent Labs Lab 05/10/17 1244  WBC 15.8*  NEUTROABS 13.2*  HGB 12.4  HCT 38.4  MCV 97.7  PLT 358   Cardiac Enzymes: No results for input(s): CKTOTAL, CKMB, CKMBINDEX, TROPONINI in the last 168 hours.  BNP (last 3 results) No results for input(s): BNP in the last 8760 hours.  ProBNP (last 3 results) No results for input(s): PROBNP in the last 8760 hours.  CBG:  Recent Labs Lab 05/10/17 1150 05/10/17 1437 05/10/17 1558 05/10/17 1721 05/10/17 1832  GLUCAP >600* >600* >600* >600* >600*    Radiological  Exams on Admission: Dg Chest 2 View  Result Date: 05/10/2017 CLINICAL DATA:  Hyperglycemia with confusion. Nausea and vomiting. Cough. EXAM: CHEST  2 VIEW COMPARISON:  October 31, 2016 and January 14, 2016 FINDINGS: There is a focal area of increased opacity in the right base, felt to represent a small focus of pneumonia. There is chronic scarring in the lingula anteriorly. Lungs elsewhere clear. Heart is upper normal in size with pulmonary vascularity within normal limits. There is aortic atherosclerosis. No adenopathy. There is degenerative change in the right shoulder. IMPRESSION: Small area of presumed pneumonia in the right base. Scarring anterior lingula. Lungs elsewhere clear. Heart upper normal in size. Aortic atherosclerosis present. Aortic Atherosclerosis (ICD10-I70.0). Followup PA and lateral chest radiographs recommended in 3-4 weeks following trial of antibiotic therapy to ensure resolution and exclude underlying malignancy. Electronically Signed   By: Lowella Grip III M.D.   On: 05/10/2017 12:51   Ct Head Wo Contrast  Result Date: 05/10/2017 CLINICAL DATA:  Mental status changes.  Recent stroke. EXAM: CT HEAD WITHOUT CONTRAST TECHNIQUE: Contiguous  axial images were obtained from the base of the skull through the vertex without intravenous contrast. COMPARISON:  10/31/2016.  MRI 11/02/2016. FINDINGS: Brain: There is no evidence for acute hemorrhage, hydrocephalus, mass lesion, or abnormal extra-axial fluid collection. No definite CT evidence for acute infarction. Diffuse loss of parenchymal volume is consistent with atrophy. Patchy low attenuation in the deep hemispheric and periventricular white matter is nonspecific, but likely reflects chronic microvascular ischemic demyelination. Vascular: No hyperdense vessel or unexpected calcification. Skull: No evidence for fracture. No worrisome lytic or sclerotic lesion. Sinuses/Orbits: The visualized paranasal sinuses and mastoid air cells are clear. Visualized portions of the globes and intraorbital fat are unremarkable. Other: None. IMPRESSION: 1. No acute intracranial abnormality. 2. Atrophy with chronic small vessel white matter ischemic disease. Electronically Signed   By: Misty Stanley M.D.   On: 05/10/2017 18:09   Ct Chest Wo Contrast  Result Date: 05/10/2017 CLINICAL DATA:  61 year old female with history of diabetes presenting with several days of emesis. Concern for aspiration. EXAM: CT CHEST, ABDOMEN AND PELVIS WITHOUT CONTRAST TECHNIQUE: Multidetector CT imaging of the chest, abdomen and pelvis was performed following the standard protocol without IV contrast. COMPARISON:  Chest radiograph dated 05/10/2017 FINDINGS: Evaluation of this exam is limited in the absence of intravenous contrast. CT CHEST FINDINGS Cardiovascular: Are there is no cardiomegaly or pericardial effusion. Multi vessel coronary vascular calcification with involvement of the LAD, RCA, and left circumflex artery. There is moderate atherosclerotic calcification of the thoracic aorta. There is a mild dilatation of the main pulmonary trunk suggestive of underlying pulmonary hypertension. Evaluation of the vasculature is limited in the  absence of intravenous contrast. Mediastinum/Nodes: There is no hilar or mediastinal adenopathy. There is a small hiatal hernia. The esophagus appears thickened with small amount of debris. Findings may be reactive and represent inflammatory changes secondary to recurrent vomiting. Underlying esophageal infiltrative process is less likely but not entirely excluded. No mediastinal fluid collection or hematoma. No evidence of pneumomediastinum. Lungs/Pleura: The lungs are clear. There is no pleural effusion or pneumothorax. Minimal linear atelectatic changes of the left upper lobe noted. The central airways are patent. Musculoskeletal: There is degenerative changes of the spine shoulder. No acute osseous pathology. Focal area of apparent cortical discontinuity of the mid sternal body, likely artifactual and related to motion artifact. Correlation with clinical exam and point tenderness recommended. CT ABDOMEN PELVIS FINDINGS No intra-abdominal free air or free  fluid. Hepatobiliary: No focal liver abnormality is seen. No gallstones, gallbladder wall thickening, or biliary dilatation. Pancreas: Unremarkable. No pancreatic ductal dilatation or surrounding inflammatory changes. Spleen: Normal in size without focal abnormality. Adrenals/Urinary Tract: The adrenal glands are unremarkable. There is no hydronephrosis or obstructing stone on either side. Multiple small calcific densities in the right renal hilum most consistent with renovascular calcification. The visualized ureters and urinary bladder appear unremarkable. Stomach/Bowel: There is colonic diverticulosis without active inflammatory changes. There is no evidence of bowel obstruction or active inflammation. Normal appendix. Vascular/Lymphatic: There is advanced aortoiliac atherosclerotic disease. No portal venous gas identified. There is no adenopathy. Reproductive: The uterus is grossly unremarkable. No pelvic mass. The ovaries appear unremarkable as visualized.  Other: Small fat containing umbilical hernia. Musculoskeletal: There is osteopenia with degenerative changes of the spine and hips. No acute osseous pathology. IMPRESSION: 1. Mild thickened appearance of the esophagus which may represent reactive esophagitis related to recurrent or vomiting. Clinical correlation is recommended. No other acute intrathoracic, abdominal, or pelvic pathology. Specifically there is no evidence of aspiration. 2. No hydronephrosis or nephrolithiasis. 3. Scattered colonic diverticula. No bowel obstruction or active inflammation. Normal appendix. 4.  Aortic Atherosclerosis (ICD10-I70.0). Electronically Signed   By: Anner Crete M.D.   On: 05/10/2017 18:32   Ct Renal Stone Study  Result Date: 05/10/2017 CLINICAL DATA:  61 year old female with history of diabetes presenting with several days of emesis. Concern for aspiration. EXAM: CT CHEST, ABDOMEN AND PELVIS WITHOUT CONTRAST TECHNIQUE: Multidetector CT imaging of the chest, abdomen and pelvis was performed following the standard protocol without IV contrast. COMPARISON:  Chest radiograph dated 05/10/2017 FINDINGS: Evaluation of this exam is limited in the absence of intravenous contrast. CT CHEST FINDINGS Cardiovascular: Are there is no cardiomegaly or pericardial effusion. Multi vessel coronary vascular calcification with involvement of the LAD, RCA, and left circumflex artery. There is moderate atherosclerotic calcification of the thoracic aorta. There is a mild dilatation of the main pulmonary trunk suggestive of underlying pulmonary hypertension. Evaluation of the vasculature is limited in the absence of intravenous contrast. Mediastinum/Nodes: There is no hilar or mediastinal adenopathy. There is a small hiatal hernia. The esophagus appears thickened with small amount of debris. Findings may be reactive and represent inflammatory changes secondary to recurrent vomiting. Underlying esophageal infiltrative process is less likely  but not entirely excluded. No mediastinal fluid collection or hematoma. No evidence of pneumomediastinum. Lungs/Pleura: The lungs are clear. There is no pleural effusion or pneumothorax. Minimal linear atelectatic changes of the left upper lobe noted. The central airways are patent. Musculoskeletal: There is degenerative changes of the spine shoulder. No acute osseous pathology. Focal area of apparent cortical discontinuity of the mid sternal body, likely artifactual and related to motion artifact. Correlation with clinical exam and point tenderness recommended. CT ABDOMEN PELVIS FINDINGS No intra-abdominal free air or free fluid. Hepatobiliary: No focal liver abnormality is seen. No gallstones, gallbladder wall thickening, or biliary dilatation. Pancreas: Unremarkable. No pancreatic ductal dilatation or surrounding inflammatory changes. Spleen: Normal in size without focal abnormality. Adrenals/Urinary Tract: The adrenal glands are unremarkable. There is no hydronephrosis or obstructing stone on either side. Multiple small calcific densities in the right renal hilum most consistent with renovascular calcification. The visualized ureters and urinary bladder appear unremarkable. Stomach/Bowel: There is colonic diverticulosis without active inflammatory changes. There is no evidence of bowel obstruction or active inflammation. Normal appendix. Vascular/Lymphatic: There is advanced aortoiliac atherosclerotic disease. No portal venous gas identified. There is no adenopathy. Reproductive:  The uterus is grossly unremarkable. No pelvic mass. The ovaries appear unremarkable as visualized. Other: Small fat containing umbilical hernia. Musculoskeletal: There is osteopenia with degenerative changes of the spine and hips. No acute osseous pathology. IMPRESSION: 1. Mild thickened appearance of the esophagus which may represent reactive esophagitis related to recurrent or vomiting. Clinical correlation is recommended. No other  acute intrathoracic, abdominal, or pelvic pathology. Specifically there is no evidence of aspiration. 2. No hydronephrosis or nephrolithiasis. 3. Scattered colonic diverticula. No bowel obstruction or active inflammation. Normal appendix. 4.  Aortic Atherosclerosis (ICD10-I70.0). Electronically Signed   By: Anner Crete M.D.   On: 05/10/2017 18:32    EKG: Independently reviewed.  Sinus tachycardia, no acute ST-T changes  Assessment/Plan Present on Admission: . Confusion  Severe DKA:  Blood sugar 1000, anion gap 32 on admission  continue insulin drip, IV fluids  Insulin-dependent type 2 diabetes  medication noncompliance, will check A1c, currently on insulin drip  Sepsis/ with suspected Aspiration pneumonia She has sinus tachycardia, tachypnea, leukocytosis, lactic acidosis on presentation. Blood culture obtained in the ED, urine culture pending collection.  she received rocephin/zitthro in the ED, Will change to unasyn . Continue ivf, trend lactic acid. Npo , aspiration precaution, speech eval. Ct chest  AKI on ckdIII Likely from dehydration, we will continue hydration ,  Treating DKA, renal dosing medication CT abdomen/pel pending  Hyperkalemia  potassium 5.8, likely in the setting of insulin insufficiency ,acidosis and acute renal failure Continue hydration, insulin drip , Repeat labs, expect improvement  Hyponatremia Likely due to  pseudohyponatremia and dehydration  continue hydration, insulin drip, repeat labs, expect improvement.  Hematemesis? Gastric occult blood test pending hgb stable so far. started ppi iv  Encephalopathy:  Likely multifactorial, including DKA, sepsis, metabolic abnormalities . patient seems to have left gaze, with history of ICH and CVA, we will get CT of the head  she is currently too agitated to lay still for MRI   H/o ICH and CVA:  ct head, continue keppra changed to iv.  Chronic right lower leg wound  currently does not appear to  be infected  she is referred to vascular surgery and had ABI done recently  wound care consulted  H/o hepC: She is recently evaluated by infectious disease.  Cocaine abuse: avoid betablocker. Alcohol abuse: daily beer. Start ciwa.  Cigarettes smoking: nicotine patch    DVT prophylaxis: scd  Consultants:   Code Status: full   Family Communication:  Patient , care giver over the phone  Disposition Plan: admit to ICU/stepdown   Total Critical Care time in examining the patient bedside, evaluating Lab work and other data, over half of the total time was spent in coordinating patient care on the floor or bedisde in talking to patient/family members, communicating with nursing Staff on the floor and EDP/RN, caregiver over  The phone to coordinate patients medical care and needs is 80 Minutes.  The condition which has caused critical injury/acute impairment of  vital organ system with a high probability of sudden clinically significant deterioration and can cause Potential Life threatening injury to this patient addressed today is severe DKA, altered mental stasis, sepsis, aspiration pneumonia, aki, possible gi bleed.    Damarion Mendizabal MD, PhD [facility] Pager [phone] If 7PM-7AM, please contact night-coverage at [email or web address], password [facility]

## 2017-05-10 NOTE — ED Triage Notes (Addendum)
Pt BIB EMS from home for N/V and hyperglycemia. Pt reports its been 5 days since she has had insulin.  Pt reports that she can call in to get her insulin delivered but she "didn't put much thought into it." in regards to obtaining more insulin when she ran out and has not had any needles.  EMS: BP 155/63 Hr 112 RR 16 100% room air.

## 2017-05-10 NOTE — ED Notes (Signed)
Bed: WA20 Expected date:  Expected time:  Means of arrival:  Comments: ems 

## 2017-05-10 NOTE — ED Notes (Signed)
Date and time results received: 05/10/17 1:49 PM  Test: glucose Critical Value: 1112  Name of Provider Notified: Zenia Resides, MD  Orders Received? Or Actions Taken?: MD placing orders now

## 2017-05-10 NOTE — ED Notes (Signed)
ED TO INPATIENT HANDOFF REPORT  Name/Age/Gender [name] 61 y.o. female  Code Status Code Status History    Date Active Date Inactive Code Status Order ID Comments User Context   11/06/2016  3:09 PM 11/12/2016  5:37 PM Full Code [id number]  Bary Leriche, PA-C Inpatient   10/31/2016  7:00 PM 11/02/2016  6:20 PM Full Code [id number]  Omar Person, NP ED   06/09/2016  6:01 PM 06/09/2016  6:01 PM Full Code [id number]  Cathlyn Parsons, PA-C Inpatient   06/09/2016  6:01 PM 06/20/2016  3:32 PM Full Code [id number]  Cathlyn Parsons, PA-C Inpatient   06/06/2016  5:05 PM 06/09/2016  5:44 PM Full Code [id number]  Darrel Reach, MD ED   05/15/2011  4:36 PM 05/16/2011  6:42 PM Full Code [id number]  Joanna Hews, RN Inpatient      Home/SNF/Other Home  Chief Complaint hyperglycemic  Level of Care/Admitting Diagnosis ED Disposition    ED Disposition Condition [facility] Area: [facility] [[id number]]  Level of Care: Stepdown [14]  Admit to SDU based on following criteria: Hemodynamic compromise or significant risk of instability:  Patient requiring short term acute titration and management of vasoactive drips, and invasive monitoring (i.e., CVP and Arterial line).  Diagnosis: Confusion [[id number]]  Admitting Physician: Florencia Reasons [[id number]]  Attending Physician: Florencia Reasons [[id number]]  Estimated length of stay: past midnight tomorrow  Certification:: I certify this patient will need inpatient services for at least 2 midnights  PT Class (Do Not Modify): Inpatient [101]  PT Acc Code (Do Not Modify): Private [1]       Medical History Past Medical History:  Diagnosis Date  . Cocaine abuse, continuous ([place])   . Diabetes mellitus    Hb A1C = 12.6 on 05/15/11, managed on Novolog 70/30, 35 U qam, 25 U qpm  . Hypertension    poorly controlled  . Insomnia disorder   . Shortness of breath     Allergies No Known Allergies  IV  Location/Drains/Wounds Patient Lines/Drains/Airways Status   Active Line/Drains/Airways    Name:   Placement date:   Placement time:   Site:   Days:   Peripheral IV 05/10/17 Right Forearm  05/10/17    1341    Forearm    less than 1   Peripheral IV 05/10/17 Left Forearm  05/10/17    1407    Forearm    less than 1   External Urinary Catheter  05/10/17    1607        less than 1          Labs/Imaging Results for orders placed or performed during the hospital encounter of 05/10/17 (from the past 48 hour(s))  CBG monitoring, ED     Status: Abnormal   Collection Time: 05/10/17 11:50 AM  Result Value Ref Range   Glucose-Capillary >600 (HH) 65 - 99 mg/dL  CBC with Differential/Platelet     Status: Abnormal   Collection Time: 05/10/17 12:44 PM  Result Value Ref Range   WBC 15.8 (H) 4.0 - 10.5 K/uL   RBC 3.93 3.87 - 5.11 MIL/uL   Hemoglobin 12.4 12.0 - 15.0 g/dL   HCT 38.4 36.0 - 46.0 %   MCV 97.7 78.0 - 100.0 fL   MCH 31.6 26.0 - 34.0 pg   MCHC 32.3 30.0 - 36.0 g/dL   RDW 13.0 11.5 - 15.5 %   Platelets 358 150 - 400 K/uL  Neutrophils Relative % 84 %   Neutro Abs 13.2 (H) 1.7 - 7.7 K/uL   Lymphocytes Relative 9 %   Lymphs Abs 1.5 0.7 - 4.0 K/uL   Monocytes Relative 7 %   Monocytes Absolute 1.2 (H) 0.1 - 1.0 K/uL   Eosinophils Relative 0 %   Eosinophils Absolute 0.0 0.0 - 0.7 K/uL   Basophils Relative 0 %   Basophils Absolute 0.0 0.0 - 0.1 K/uL  Basic metabolic panel     Status: Abnormal   Collection Time: 05/10/17 12:44 PM  Result Value Ref Range   Sodium 125 (L) 135 - 145 mmol/L    Comment: REPEATED TO VERIFY   Potassium 5.8 (H) 3.5 - 5.1 mmol/L    Comment: REPEATED TO VERIFY NO VISIBLE HEMOLYSIS    Chloride 83 (L) 101 - 111 mmol/L    Comment: REPEATED TO VERIFY   CO2 10 (L) 22 - 32 mmol/L    Comment: REPEATED TO VERIFY   Glucose, Bld 1,112 (HH) 65 - 99 mg/dL    Comment: CRITICAL RESULT CALLED TO, READ BACK BY AND VERIFIED WITH: ROSSER,M. RN AT 1348 05/10/17 MULLINS,T     BUN 52 (H) 6 - 20 mg/dL   Creatinine, Ser 3.56 (H) 0.44 - 1.00 mg/dL   Calcium 8.8 (L) 8.9 - 10.3 mg/dL   GFR calc non Af Amer 13 (L) >60 mL/min   GFR calc Af Amer 15 (L) >60 mL/min    Comment: (NOTE) The eGFR has been calculated using the CKD EPI equation. This calculation has not been validated in all clinical situations. eGFR's persistently <60 mL/min signify possible Chronic Kidney Disease.    Anion gap 32 (H) 5 - 15    Comment: REPEATED TO VERIFY  Blood gas, arterial ([facility] & [facility] ONLY)     Status: Abnormal   Collection Time: 05/10/17 12:49 PM  Result Value Ref Range   FIO2 0.21    pH, Arterial 7.256 (L) 7.350 - 7.450   pCO2 arterial 21.9 (L) 32.0 - 48.0 mmHg   pO2, Arterial 77.8 (L) 83.0 - 108.0 mmHg   Bicarbonate 9.4 (L) 20.0 - 28.0 mmol/L   Acid-base deficit 16.2 (H) 0.0 - 2.0 mmol/L   O2 Saturation 92.8 %   Patient temperature 98.6    Collection site RIGHT RADIAL    Drawn by DRAWN BY RN    Sample type ARTERIAL DRAW    Allens test (pass/fail) PASS PASS  I-Stat CG4 Lactic Acid, ED     Status: Abnormal   Collection Time: 05/10/17  2:21 PM  Result Value Ref Range   Lactic Acid, Venous 2.37 (HH) 0.5 - 1.9 mmol/L   Comment NOTIFIED PHYSICIAN   CBG monitoring, ED     Status: Abnormal   Collection Time: 05/10/17  2:37 PM  Result Value Ref Range   Glucose-Capillary >600 (HH) 65 - 99 mg/dL  CBG monitoring, ED     Status: Abnormal   Collection Time: 05/10/17  3:58 PM  Result Value Ref Range   Glucose-Capillary >600 (HH) 65 - 99 mg/dL  Urinalysis, Routine w reflex microscopic     Status: Abnormal   Collection Time: 05/10/17  4:06 PM  Result Value Ref Range   Color, Urine STRAW (A) YELLOW   APPearance CLEAR CLEAR   Specific Gravity, Urine 1.016 1.005 - 1.030   pH 5.0 5.0 - 8.0   Glucose, UA >=500 (A) NEGATIVE mg/dL   Hgb urine dipstick MODERATE (A) NEGATIVE   Bilirubin Urine NEGATIVE NEGATIVE  Ketones, ur 80 (A) NEGATIVE mg/dL   Protein, ur 100 (A) NEGATIVE mg/dL    Nitrite NEGATIVE NEGATIVE   Leukocytes, UA MODERATE (A) NEGATIVE   RBC / HPF 6-30 0 - 5 RBC/hpf   WBC, UA 0-5 0 - 5 WBC/hpf   Bacteria, UA RARE (A) NONE SEEN   Squamous Epithelial / LPF 0-5 (A) NONE SEEN  Rapid urine drug screen (hospital performed)     Status: Abnormal   Collection Time: 05/10/17  4:06 PM  Result Value Ref Range   Opiates NONE DETECTED NONE DETECTED   Cocaine POSITIVE (A) NONE DETECTED   Benzodiazepines NONE DETECTED NONE DETECTED   Amphetamines NONE DETECTED NONE DETECTED   Tetrahydrocannabinol NONE DETECTED NONE DETECTED   Barbiturates NONE DETECTED NONE DETECTED    Comment:        DRUG SCREEN FOR MEDICAL PURPOSES ONLY.  IF CONFIRMATION IS NEEDED FOR ANY PURPOSE, NOTIFY LAB WITHIN 5 DAYS.        LOWEST DETECTABLE LIMITS FOR URINE DRUG SCREEN Drug Class       Cutoff (ng/mL) Amphetamine      1000 Barbiturate      200 Benzodiazepine   580 Tricyclics       998 Opiates          300 Cocaine          300 THC              50   Occult blood gastric / duodenum     Status: Abnormal   Collection Time: 05/10/17  4:38 PM  Result Value Ref Range   pH, Gastric 3    Occult Blood, Gastric POSITIVE (A) NEGATIVE  CBG monitoring, ED     Status: Abnormal   Collection Time: 05/10/17  5:21 PM  Result Value Ref Range   Glucose-Capillary >600 (HH) 65 - 99 mg/dL   Dg Chest 2 View  Result Date: 05/10/2017 CLINICAL DATA:  Hyperglycemia with confusion. Nausea and vomiting. Cough. EXAM: CHEST  2 VIEW COMPARISON:  October 31, 2016 and January 14, 2016 FINDINGS: There is a focal area of increased opacity in the right base, felt to represent a small focus of pneumonia. There is chronic scarring in the lingula anteriorly. Lungs elsewhere clear. Heart is upper normal in size with pulmonary vascularity within normal limits. There is aortic atherosclerosis. No adenopathy. There is degenerative change in the right shoulder. IMPRESSION: Small area of presumed pneumonia in the right base. Scarring  anterior lingula. Lungs elsewhere clear. Heart upper normal in size. Aortic atherosclerosis present. Aortic Atherosclerosis (ICD10-I70.0). Followup PA and lateral chest radiographs recommended in 3-4 weeks following trial of antibiotic therapy to ensure resolution and exclude underlying malignancy. Electronically Signed   By: Lowella Grip III M.D.   On: 05/10/2017 12:51    Pending Labs [employer]    Start     Ordered   05/10/17 1748  Culture, Urine  Once,   R     05/10/17 1747   05/10/17 3382  Basic metabolic panel  Now then every 4 hours,   R     05/10/17 1746   05/10/17 1747  MRSA PCR Screening  Once,   R     05/10/17 1746   05/10/17 1635  Type and screen [place]  Once,   R    Comments:  [place]    05/10/17 1634   05/10/17 5053  Basic metabolic panel  STAT,   R  05/10/17 1633   05/10/17 1634  Hepatic function panel  Once,   R     05/10/17 1633   05/10/17 1634  Protime-INR  Once,   R     05/10/17 1634   05/10/17 1402  Culture, blood (Routine X 2) w Reflex to ID Panel  BLOOD CULTURE X 2,   R     05/10/17 1402      Vitals/Pain Today's Vitals   05/10/17 1500 05/10/17 1557 05/10/17 1630 05/10/17 1700  BP: (!) 154/107  (!) 154/108 133/75  Pulse: (!) 117  (!) 134   Resp:   (!) 21   Temp:  99.7 F (37.6 C)    TempSrc:  Rectal    SpO2: 100%  95%   Weight:      Height:        Isolation Precautions No active isolations  Medications Medications  0.9 %  sodium chloride infusion ( Intravenous New Bag/Given 05/10/17 1448)  insulin regular (NOVOLIN R,HUMULIN R) 100 Units in sodium chloride 0.9 % 100 mL (1 Units/mL) infusion (16.2 Units/hr Intravenous Rate/Dose Change 05/10/17 1723)  sodium chloride 0.9 % bolus 1,000 mL (not administered)    And  sodium chloride 0.9 % bolus 1,000 mL (1,000 mLs Intravenous New Bag/Given 05/10/17 1646)    And  0.9 %  sodium chloride infusion (not administered)  dextrose 5 %-0.45 % sodium  chloride infusion (not administered)  ondansetron (ZOFRAN) injection 4 mg (4 mg Intravenous Given 05/10/17 1641)  pantoprazole (PROTONIX) injection 40 mg (not administered)  LORazepam (ATIVAN) injection 1 mg (not administered)  atorvastatin (LIPITOR) tablet 40 mg (not administered)  levETIRAcetam (KEPPRA) 500 mg in sodium chloride 0.9 % 100 mL IVPB (not administered)  Ampicillin-Sulbactam (UNASYN) 3 g in sodium chloride 0.9 % 100 mL IVPB (not administered)  sodium chloride 0.9 % bolus 1,000 mL (0 mLs Intravenous Stopped 05/10/17 1645)    Mobility Non ambulatory

## 2017-05-10 NOTE — ED Notes (Signed)
[place] of contact, currently taking care of pt's husband and is who can be called for a ride home if d/c

## 2017-05-10 NOTE — ED Provider Notes (Signed)
[place] [facility] DEPT Provider Note   CSN: [id number] Arrival date & time: 05/10/17  1140     History   Chief Complaint Chief Complaint  Patient presents with  . Hyperglycemia    HPI [name] is a 61 y.o. female.  61 year old female with history of insulin dependent diabetes who presents with several days of emesis as well as polyuria and polydipsia.  She has not been compliant with her insulin and has now had diffuse weakness as well.  Emesis has not been bloody or dark.  Has had some abdominal discomfort without diarrhea.  States that she cannot keep anything down at this time.  Blood sugars at home have been as high as 700.  Called EMS and blood sugars greater than 600 and patient was transported here      Past Medical History:  Diagnosis Date  . Cocaine abuse, continuous ([place])   . Diabetes mellitus    Hb A1C = 12.6 on 05/15/11, managed on Novolog 70/30, 35 U qam, 25 U qpm  . Hypertension    poorly controlled  . Insomnia disorder   . Shortness of breath     Patient Active Problem List   Diagnosis Date Noted  . Benign essential HTN   . Chronic hepatitis C without hepatic coma ([place])   . Labile blood glucose   . Hypoglycemia associated with type 2 diabetes mellitus ([place])   . Poorly controlled type 2 diabetes mellitus with peripheral neuropathy ([place])   . Neurologic gait disorder   . Neuropathic pain   . Type 2 diabetes mellitus with peripheral neuropathy (H[facility])   . History of intracranial hemorrhage   . Diarrhea   . Urinary incontinence   . Hypokalemia   . Polysubstance abuse ([place])   . Right sided cerebral hemisphere cerebrovascular accident (CVA) ([place])   . Disorientation   . Meningitis 11/02/2016  . Type 2 diabetes mellitus with hyperosmolar nonketotic hyperglycemia ([place]) 10/31/2016  . Seizure ([place]) 10/31/2016  . ARF (acute renal failure) ([place]) 10/31/2016  . Severe sepsis ([place]) 10/31/2016  . DKA (diabetic ketoacidoses) ([place])  10/31/2016  . AKI (acute kidney injury) ([place])   . Right hemiparesis ([place]) 06/09/2016  . 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 Cytotoxic brain edema ([place]) 06/08/2016  . Encephalopathy acute 06/06/2016  . Chest pain, musculoskeletal 05/16/2011  . Vaginal candidiasis 05/16/2011  . Hypertension 05/16/2011  . Diabetes mellitus, type 2 ([place]) 05/16/2011  . CHEST PAIN 10/01/2009  . CELLULITIS AND ABSCESS OF LEG EXCEPT FOOT 08/31/2009  . INSOMNIA 07/13/2008  . MRSA 04/17/2008  . NECK MASS 04/17/2008  . COCAINE ABUSE 11/01/2007  . OBESITY NOS 07/14/2006  . DENTAL CARIES 07/14/2006  . DIABETES MELLITUS, TYPE II 04/26/2006  . ALCOHOL ABUSE 04/26/2006  . TOBACCO ABUSE 04/26/2006  . DEPRESSION 04/26/2006  . Essential hypertension 04/26/2006  . GERD 04/26/2006  . LOW BACK PAIN 04/26/2006    Past Surgical History:  Procedure Laterality Date  . ANKLE FRACTURE SURGERY  2007    OB History    No data available       Home Medications    Prior to Admission medications   Medication Sig Start Date End Date Taking? Authorizing Provider  amLODipine (NORVASC) 10 MG tablet Take 1 tablet (10 mg total) by mouth daily. 11/10/16   Love, Ivan Anchors, PA-C  amoxicillin-clavulanate (AUGMENTIN) 875-125 MG tablet Take 1 tablet by mouth 2 (two) times daily. 03/27/17   Carlyle Basques, MD  aspirin 81 MG chewable tablet Chew  1 tablet (81 mg total) by mouth daily. 11/07/16   Rosita Fire, MD  atorvastatin (LIPITOR) 40 MG tablet Take 1 tablet (40 mg total) by mouth daily at 6 PM. 11/10/16   Love, Ivan Anchors, PA-C  folic acid (FOLVITE) 1 MG tablet Take 1 tablet (1 mg total) by mouth daily. 11/10/16   Love, Ivan Anchors, PA-C  insulin aspart protamine- aspart (NOVOLOG MIX 70/30) (70-30) 100 UNIT/ML injection Inject 0.22 mLs (22 Units total) into the skin 2 (two) times daily with a meal. 11/12/16   Patel, Domenick Bookbinder, MD  levETIRAcetam (KEPPRA) 500 MG tablet Take 1 tablet (500 mg total) by mouth 2 (two) times daily. 11/10/16    Love, Ivan Anchors, PA-C  lisinopril (PRINIVIL,ZESTRIL) 5 MG tablet Take 3 tablets (15 mg total) by mouth 2 (two) times daily. 11/12/16 12/12/16  Jamse Arn, MD  metoprolol tartrate (LOPRESSOR) 25 MG tablet Take 1 tablet (25 mg total) by mouth 2 (two) times daily. 11/10/16   Love, Ivan Anchors, PA-C  Multiple Vitamin (MULTIVITAMIN WITH MINERALS) TABS tablet Take 1 tablet by mouth daily. 06/20/16   Angiulli, Lavon Paganini, PA-C  oxybutynin (DITROPAN-XL) 5 MG 24 hr tablet Take 1 tablet (5 mg total) by mouth at bedtime. 11/10/16   Love, Ivan Anchors, PA-C  PROAIR HFA 108 [phone] Base) MCG/ACT inhaler Inhale 2 puffs into the lungs 4 (four) times daily as needed for wheezing. 10/18/16   [provider]    Family History Family History  Problem Relation Age of Onset  . Stroke Father   . Diabetes Sister   . Diabetes Brother     Social History Social History  Substance Use Topics  . Smoking status: Current Every Day Smoker    Packs/day: 1.50    Years: 25.00    Types: Cigarettes  . Smokeless tobacco: Never Used  . Alcohol use 4.8 oz/week    2 Glasses of wine, 6 Cans of beer per week     Comment: pint of liquor a week     Allergies   Patient has no known allergies.   Review of Systems Review of Systems  All other systems reviewed and are negative.    Physical Exam Updated Vital Signs BP (!) 168/108 (BP Location: Left Arm)   Pulse (!) 108   Temp 98.5 F (36.9 C) (Oral)   Resp 20   SpO2 98%   Physical Exam  Constitutional: She is oriented to person, place, and time. She appears well-developed and well-nourished.  Non-toxic appearance. No distress.  HENT:  Head: Normocephalic and atraumatic.  Eyes: Pupils are equal, round, and reactive to light. Conjunctivae, EOM and lids are normal.  Neck: Normal range of motion. Neck supple. No tracheal deviation present. No thyroid mass present.  Cardiovascular: Normal rate, regular rhythm and normal heart sounds.  Exam reveals no gallop.   No murmur  heard. Pulmonary/Chest: Effort normal and breath sounds normal. No stridor. No respiratory distress. She has no decreased breath sounds. She has no wheezes. She has no rhonchi. She has no rales.  Abdominal: Soft. Normal appearance and bowel sounds are normal. She exhibits no distension. There is no tenderness. There is no rebound and no CVA tenderness.  Musculoskeletal: Normal range of motion. She exhibits no edema or tenderness.  Neurological: She is alert and oriented to person, place, and time. She has normal strength. No cranial nerve deficit or sensory deficit. GCS eye subscore is 4. GCS verbal subscore is 5. GCS motor subscore is 6.  Skin: Skin is warm and dry. No abrasion and no rash noted.  Psychiatric: Her affect is blunt. Her speech is delayed. She is withdrawn.  Nursing note and vitals reviewed.    ED Treatments / Results  Labs (all labs ordered are listed, but only abnormal results are displayed) Labs Reviewed  CBG MONITORING, ED - Abnormal; Notable for the following:       Result Value   Glucose-Capillary >600 (*)    All other components within normal limits  CBC WITH DIFFERENTIAL/PLATELET  BASIC METABOLIC PANEL  BLOOD GAS, VENOUS  URINALYSIS, ROUTINE W REFLEX MICROSCOPIC    EKG  EKG Interpretation None       Radiology No results found.  Procedures Procedures (including critical care time)  Medications Ordered in ED Medications  sodium chloride 0.9 % bolus 1,000 mL (not administered)  0.9 %  sodium chloride infusion (not administered)     Initial Impression / Assessment and Plan / ED Course  I have reviewed the triage vital signs and the nursing notes.  Pertinent labs & imaging results that were available during my care of the patient were reviewed by me and considered in my medical decision making (see chart for details).     Patient given IV fluids for severe hyperglycemia.  Patient has evidence of ketoacidosis.  Started on insulin drip.  Has  evidence of pneumonia on chest x-ray.  Blood cultures and lactate ordered and noted.  Critical care consulted and recommends medicine admission to stepdown  CRITICAL CARE Performed by: Leota Jacobsen Total critical care time: 60 minutes Critical care time was exclusive of separately billable procedures and treating other patients. Critical care was necessary to treat or prevent imminent or life-threatening deterioration. Critical care was time spent personally by me on the following activities: development of treatment plan with patient and/or surrogate as well as nursing, discussions with consultants, evaluation of patient's response to treatment, examination of patient, obtaining history from patient or surrogate, ordering and performing treatments and interventions, ordering and review of laboratory studies, ordering and review of radiographic studies, pulse oximetry and re-evaluation of patient's condition.   Final Clinical Impressions(s) / ED Diagnoses   Final diagnoses:  Cough    New Prescriptions New Prescriptions   No medications on file     Lacretia Leigh, MD 05/10/17 1534

## 2017-05-10 NOTE — ED Notes (Signed)
RN Caryl Pina currently trying to get an [place] IV

## 2017-05-10 NOTE — Progress Notes (Signed)
CRITICAL VALUE ALERT  Critical Value: Lactic acid 3  Date & Time Notied:  05/10/17  Provider Notified: Forrest Moron, NP  Orders Received/Actions taken: 1064ml Normal saline bolus ordered

## 2017-05-10 NOTE — Consult Note (Signed)
PULMONARY / CRITICAL CARE MEDICINE   Name: [name] MRN: [id number] DOB: 05-24-1956    ADMISSION DATE:  05/10/2017 CONSULTATION DATE: 11/1  REFERRING MD: Zenia Resides  CHIEF COMPLAINT: DKA  HISTORY OF PRESENT ILLNESS:   This is a 61 year old female with a known history of diabetes, insulin-dependent, recent admission for stroke, possible bacterial meningitis.  Apparently has not taken her insulin for the last 5 days.  Apparently she typically needs to call to get her insulin delivered but " did not put much thought into it"; presents to the emergency room with chief complaint of nausea vomiting polyuria and polydipsia.  Also reporting worsening weakness with crampy abdominal discomfort but no diarrhea.  Glucoses were as high as 700 at home. ER evaluation: White blood cell count 15.8, potassium 5.8, anion gap 32, pH of 7.25, PCO2 of 22, and bicarbonate of 9.4.  Critical care was asked to evaluate. In the ER she was able to answer my questions though she was confused.  She reported no pain or distress.    PAST MEDICAL HISTORY :  She  has a past medical history of Cocaine abuse, continuous ([place]); Diabetes mellitus; Hypertension; Insomnia disorder; and Shortness of breath.  PAST SURGICAL HISTORY: She  has a past surgical history that includes Ankle fracture surgery (2007).  No Known Allergies  No current facility-administered medications on file prior to encounter.    Current Outpatient Prescriptions on File Prior to Encounter  Medication Sig  . amLODipine (NORVASC) 10 MG tablet Take 1 tablet (10 mg total) by mouth daily.  Marland Kitchen aspirin 81 MG chewable tablet Chew 1 tablet (81 mg total) by mouth daily.  Marland Kitchen atorvastatin (LIPITOR) 40 MG tablet Take 1 tablet (40 mg total) by mouth daily at 6 PM.  . folic acid (FOLVITE) 1 MG tablet Take 1 tablet (1 mg total) by mouth daily.  . insulin aspart protamine- aspart (NOVOLOG MIX 70/30) (70-30) 100 UNIT/ML injection Inject 0.22 mLs (22 Units total) into  the skin 2 (two) times daily with a meal.  . metoprolol tartrate (LOPRESSOR) 25 MG tablet Take 1 tablet (25 mg total) by mouth 2 (two) times daily.  . Multiple Vitamin (MULTIVITAMIN WITH MINERALS) TABS tablet Take 1 tablet by mouth daily.  Marland Kitchen oxybutynin (DITROPAN-XL) 5 MG 24 hr tablet Take 1 tablet (5 mg total) by mouth at bedtime.  Marland Kitchen amoxicillin-clavulanate (AUGMENTIN) 875-125 MG tablet Take 1 tablet by mouth 2 (two) times daily. (Patient not taking: Reported on 05/10/2017)  . levETIRAcetam (KEPPRA) 500 MG tablet Take 1 tablet (500 mg total) by mouth 2 (two) times daily.  Marland Kitchen lisinopril (PRINIVIL,ZESTRIL) 5 MG tablet Take 3 tablets (15 mg total) by mouth 2 (two) times daily.  Marland Kitchen PROAIR HFA 108 (90 Base) MCG/ACT inhaler Inhale 2 puffs into the lungs 4 (four) times daily as needed for wheezing.    FAMILY HISTORY:  Her indicated that her mother is deceased. She indicated that her father is deceased. She indicated that the status of her sister is unknown. She indicated that the status of her brother is unknown.    SOCIAL HISTORY: She  reports that she has been smoking Cigarettes.  She has a 37.50 pack-year smoking history. She has never used smokeless tobacco. She reports that she drinks about 4.8 oz of alcohol per week . She reports that she uses drugs, including Cocaine.  REVIEW OF SYSTEMS:   Review of Systems:   Bolds are positive  Constitutional: weight loss, gain, night sweats, Fevers, chills, fatigue .  HEENT: headaches, Sore throat, sneezing, nasal congestion, post nasal drip, Difficulty swallowing, Tooth/dental problems, visual complaints visual changes, ear ache CV:  chest pain, radiates:,Orthopnea, PND, swelling in lower extremities, dizziness, palpitations, syncope.  GI  heartburn, indigestion, abdominal pain, nausea, vomiting, diarrhea, change in bowel habits, loss of appetite, bloody stools.  Resp: cough, productive:, hemoptysis, dyspnea, chest pain, pleuritic.  Skin: rash or itching or  icterus GU: dysuria, change in color of urine, urgency or frequency. flank pain, hematuria  MS: joint pain or swelling. decreased range of motion  Psych: change in mood or affect. depression or anxiety.  Neuro: difficulty with speech, weakness, numbness, ataxia    SUBJECTIVE:  As above  VITAL SIGNS: BP (!) 124/112 (BP Location: Left Leg)   Pulse (!) 116   Temp 98.5 F (36.9 C) (Oral)   Resp 20   Ht 5\' 8"  (1.727 m)   Wt 222 lb (100.7 kg)   SpO2 100%   BMI 33.75 kg/m   HEMODYNAMICS:    VENTILATOR SETTINGS:    INTAKE / OUTPUT: No intake/output data recorded.  PHYSICAL EXAMINATION:  General:  Chronically ill appearing, resting comfortably in bed HENT: NCAT OP clear but dry PULM: CTA B, normal effort CV: RRR, no mgr GI: BS+, soft, nontender MSK: normal bulk and tone Neuro: awake and alert, speech clear but confused, will temporarily follow commands but she is inattentive   LABS:  BMET  Recent Labs Lab 05/10/17 1244  NA 125*  K 5.8*  CL 83*  CO2 10*  BUN 52*  CREATININE 3.56*  GLUCOSE 1,112*    Electrolytes  Recent Labs Lab 05/10/17 1244  CALCIUM 8.8*    CBC  Recent Labs Lab 05/10/17 1244  WBC 15.8*  HGB 12.4  HCT 38.4  PLT 358    Coag's No results for input(s): APTT, INR in the last 168 hours.  Sepsis Markers  Recent Labs Lab 05/10/17 1421  LATICACIDVEN 2.37*    ABG  Recent Labs Lab 05/10/17 1249  PHART 7.256*  PCO2ART 21.9*  PO2ART 77.8*    Liver Enzymes No results for input(s): AST, ALT, ALKPHOS, BILITOT, ALBUMIN in the last 168 hours.  Cardiac Enzymes No results for input(s): TROPONINI, PROBNP in the last 168 hours.  Glucose  Recent Labs Lab 05/10/17 1150 05/10/17 1437  GLUCAP >600* >600*    Imaging Dg Chest 2 View  Result Date: 05/10/2017 CLINICAL DATA:  Hyperglycemia with confusion. Nausea and vomiting. Cough. EXAM: CHEST  2 VIEW COMPARISON:  October 31, 2016 and January 14, 2016 FINDINGS: There is a focal  area of increased opacity in the right base, felt to represent a small focus of pneumonia. There is chronic scarring in the lingula anteriorly. Lungs elsewhere clear. Heart is upper normal in size with pulmonary vascularity within normal limits. There is aortic atherosclerosis. No adenopathy. There is degenerative change in the right shoulder. IMPRESSION: Small area of presumed pneumonia in the right base. Scarring anterior lingula. Lungs elsewhere clear. Heart upper normal in size. Aortic atherosclerosis present. Aortic Atherosclerosis (ICD10-I70.0). Followup PA and lateral chest radiographs recommended in 3-4 weeks following trial of antibiotic therapy to ensure resolution and exclude underlying malignancy. Electronically Signed   By: Lowella Grip III M.D.   On: 05/10/2017 12:51     STUDIES:    CULTURES: Blood cultures 11/1>>> Urine culture 11/1>>>  ANTIBIOTICS:   SIGNIFICANT EVENTS:   LINES/TUBES:     ASSESSMENT / PLAN:  Anion gap metabolic acidosis in setting of DKA, complicated by hyperkalemia  Hyperosmolar state with profoundly elevated blood glucose has had elevated B-hydroxyburic acid in past, and lactic acid only mildly elevated.  -seemingly inciting factor here is non-compliance  Plan/rec Admit to SDU setting DKA protocol w/ judicious IVF resuscitation & insulin gtt Close obs of chemistries F/u drug screen  Sinus tachycardia Plan IVFs Tele   AKI in setting of dehydration  Plan Renal dose meds Cont IVFs Repeat serial chemistries.    Nausea and vomiting ->likely d/t DKA Plan  Ck amylase   DISCUSSION: Admit to medicine service Treat DKA Lactic acidosis almost certainly a complication of volume depletion and acute illness doubt she is septic.  We will be available if needed.   Roselie Awkward, MD [facility] Pager: [phone] Cell: [phone] After 3pm or if no response, call [phone]    05/10/2017, 2:53 PM ,

## 2017-05-11 DIAGNOSIS — F1010 Alcohol abuse, uncomplicated: Secondary | ICD-10-CM

## 2017-05-11 DIAGNOSIS — F1490 Cocaine use, unspecified, uncomplicated: Secondary | ICD-10-CM

## 2017-05-11 DIAGNOSIS — N183 Chronic kidney disease, stage 3 (moderate): Secondary | ICD-10-CM

## 2017-05-11 DIAGNOSIS — J690 Pneumonitis due to inhalation of food and vomit: Secondary | ICD-10-CM

## 2017-05-11 DIAGNOSIS — G9341 Metabolic encephalopathy: Secondary | ICD-10-CM

## 2017-05-11 DIAGNOSIS — E871 Hypo-osmolality and hyponatremia: Secondary | ICD-10-CM

## 2017-05-11 DIAGNOSIS — E1110 Type 2 diabetes mellitus with ketoacidosis without coma: Secondary | ICD-10-CM

## 2017-05-11 DIAGNOSIS — I10 Essential (primary) hypertension: Secondary | ICD-10-CM

## 2017-05-11 DIAGNOSIS — E875 Hyperkalemia: Secondary | ICD-10-CM

## 2017-05-11 DIAGNOSIS — Z720 Tobacco use: Secondary | ICD-10-CM

## 2017-05-11 LAB — CBC
HCT: 36.1 % (ref 36.0–46.0)
Hemoglobin: 12.4 g/dL (ref 12.0–15.0)
MCH: 31 pg (ref 26.0–34.0)
MCHC: 34.3 g/dL (ref 30.0–36.0)
MCV: 90.3 fL (ref 78.0–100.0)
PLATELETS: 311 10*3/uL (ref 150–400)
RBC: 4 MIL/uL (ref 3.87–5.11)
RDW: 12.3 % (ref 11.5–15.5)
WBC: 13.1 10*3/uL — ABNORMAL HIGH (ref 4.0–10.5)

## 2017-05-11 LAB — BASIC METABOLIC PANEL
Anion gap: 10 (ref 5–15)
BUN: 44 mg/dL — ABNORMAL HIGH (ref 6–20)
CHLORIDE: 108 mmol/L (ref 101–111)
CO2: 25 mmol/L (ref 22–32)
CREATININE: 2.36 mg/dL — AB (ref 0.44–1.00)
Calcium: 8.6 mg/dL — ABNORMAL LOW (ref 8.9–10.3)
GFR calc non Af Amer: 21 mL/min — ABNORMAL LOW (ref >60)
GFR, EST AFRICAN AMERICAN: 25 mL/min — AB (ref >60)
GLUCOSE: 133 mg/dL — AB (ref 65–99)
Potassium: 3.4 mmol/L — ABNORMAL LOW (ref 3.5–5.1)
Sodium: 143 mmol/L (ref 135–145)

## 2017-05-11 LAB — GLUCOSE, CAPILLARY
GLUCOSE-CAPILLARY: 140 mg/dL — AB (ref 65–99)
GLUCOSE-CAPILLARY: 137 mg/dL — AB (ref 65–99)
GLUCOSE-CAPILLARY: 148 mg/dL — AB (ref 65–99)
GLUCOSE-CAPILLARY: 213 mg/dL — AB (ref 65–99)
GLUCOSE-CAPILLARY: 217 mg/dL — AB (ref 65–99)
GLUCOSE-CAPILLARY: 397 mg/dL — AB (ref 65–99)
GLUCOSE-CAPILLARY: 360 mg/dL — AB (ref 65–99)
Glucose-Capillary: 121 mg/dL — ABNORMAL HIGH (ref 65–99)
Glucose-Capillary: 230 mg/dL — ABNORMAL HIGH (ref 65–99)

## 2017-05-11 LAB — HEMOGLOBIN A1C
Hgb A1c MFr Bld: 13.2 % — ABNORMAL HIGH (ref 4.8–5.6)
Mean Plasma Glucose: 332.14 mg/dL

## 2017-05-11 MED ORDER — INSULIN DETEMIR 100 UNIT/ML ~~LOC~~ SOLN
15.0000 [IU] | Freq: Every day | SUBCUTANEOUS | Status: DC
  Administered 2017-05-11: 15 [IU] via SUBCUTANEOUS
  Filled 2017-05-11: qty 0.15

## 2017-05-11 MED ORDER — ADULT MULTIVITAMIN W/MINERALS CH
1.0000 | ORAL_TABLET | Freq: Every day | ORAL | Status: DC
  Administered 2017-05-12 – 2017-05-15 (×4): 1 via ORAL
  Filled 2017-05-11 (×4): qty 1

## 2017-05-11 MED ORDER — THIAMINE HCL 100 MG/ML IJ SOLN
100.0000 mg | Freq: Every day | INTRAMUSCULAR | Status: DC
  Filled 2017-05-11: qty 2

## 2017-05-11 MED ORDER — LORAZEPAM 2 MG/ML IJ SOLN
1.0000 mg | Freq: Four times a day (QID) | INTRAMUSCULAR | Status: DC | PRN
  Administered 2017-05-12 – 2017-05-14 (×2): 1 mg via INTRAVENOUS
  Filled 2017-05-11 (×2): qty 1

## 2017-05-11 MED ORDER — INSULIN ASPART 100 UNIT/ML ~~LOC~~ SOLN
0.0000 [IU] | SUBCUTANEOUS | Status: DC
  Administered 2017-05-11: 5 [IU] via SUBCUTANEOUS

## 2017-05-11 MED ORDER — VITAMIN B-1 100 MG PO TABS
100.0000 mg | ORAL_TABLET | Freq: Every day | ORAL | Status: DC
  Administered 2017-05-12 – 2017-05-15 (×3): 100 mg via ORAL
  Filled 2017-05-11 (×4): qty 1

## 2017-05-11 MED ORDER — HYDRALAZINE HCL 20 MG/ML IJ SOLN
10.0000 mg | Freq: Three times a day (TID) | INTRAMUSCULAR | Status: DC | PRN
  Filled 2017-05-11: qty 1

## 2017-05-11 MED ORDER — LORAZEPAM 1 MG PO TABS
1.0000 mg | ORAL_TABLET | Freq: Four times a day (QID) | ORAL | Status: DC | PRN
  Administered 2017-05-12 – 2017-05-14 (×3): 1 mg via ORAL
  Filled 2017-05-11 (×3): qty 1

## 2017-05-11 MED ORDER — FOLIC ACID 1 MG PO TABS
1.0000 mg | ORAL_TABLET | Freq: Every day | ORAL | Status: DC
  Administered 2017-05-12 – 2017-05-15 (×4): 1 mg via ORAL
  Filled 2017-05-11 (×4): qty 1

## 2017-05-11 MED ORDER — INSULIN ASPART 100 UNIT/ML ~~LOC~~ SOLN
0.0000 [IU] | Freq: Every day | SUBCUTANEOUS | Status: DC
  Administered 2017-05-11 – 2017-05-12 (×2): 3 [IU] via SUBCUTANEOUS
  Administered 2017-05-13 – 2017-05-15 (×2): 2 [IU] via SUBCUTANEOUS

## 2017-05-11 MED ORDER — INSULIN ASPART 100 UNIT/ML ~~LOC~~ SOLN
0.0000 [IU] | Freq: Three times a day (TID) | SUBCUTANEOUS | Status: DC
  Administered 2017-05-11 (×2): 15 [IU] via SUBCUTANEOUS
  Administered 2017-05-12: 11 [IU] via SUBCUTANEOUS
  Administered 2017-05-12: 5 [IU] via SUBCUTANEOUS
  Administered 2017-05-12: 8 [IU] via SUBCUTANEOUS
  Administered 2017-05-13: 5 [IU] via SUBCUTANEOUS
  Administered 2017-05-13: 15 [IU] via SUBCUTANEOUS
  Administered 2017-05-14 (×2): 5 [IU] via SUBCUTANEOUS
  Administered 2017-05-14: 3 [IU] via SUBCUTANEOUS

## 2017-05-11 MED ORDER — INSULIN ASPART 100 UNIT/ML ~~LOC~~ SOLN: 0.0000 [IU] | Freq: Three times a day (TID) | SUBCUTANEOUS | Status: DC

## 2017-05-11 MED ORDER — HYDRALAZINE HCL 20 MG/ML IJ SOLN
10.0000 mg | Freq: Four times a day (QID) | INTRAMUSCULAR | Status: DC | PRN
  Administered 2017-05-11 – 2017-05-13 (×5): 10 mg via INTRAVENOUS
  Filled 2017-05-11 (×4): qty 1

## 2017-05-11 MED ORDER — HYDRALAZINE HCL 20 MG/ML IJ SOLN
10.0000 mg | Freq: Three times a day (TID) | INTRAMUSCULAR | Status: DC | PRN
  Administered 2017-05-11: 10 mg via INTRAVENOUS
  Filled 2017-05-11: qty 1

## 2017-05-11 MED ORDER — INSULIN DETEMIR 100 UNIT/ML ~~LOC~~ SOLN
15.0000 [IU] | Freq: Two times a day (BID) | SUBCUTANEOUS | Status: DC
  Administered 2017-05-11 – 2017-05-12 (×2): 15 [IU] via SUBCUTANEOUS
  Filled 2017-05-11 (×3): qty 0.15

## 2017-05-11 MED ORDER — ACETAMINOPHEN 325 MG PO TABS
650.0000 mg | ORAL_TABLET | Freq: Four times a day (QID) | ORAL | Status: DC | PRN
  Administered 2017-05-11 – 2017-05-15 (×5): 650 mg via ORAL
  Filled 2017-05-11 (×7): qty 2

## 2017-05-11 NOTE — Evaluation (Signed)
Clinical/Bedside Swallow Evaluation Patient Details  Name: [name] MRN: [id number] Date of Birth: 1956/01/18  Today's Date: 05/11/2017 Time: SLP Start Time (ACUTE ONLY): 1205 SLP Stop Time (ACUTE ONLY): 1223 SLP Time Calculation (min) (ACUTE ONLY): 18 min  Past Medical History:  Past Medical History:  Diagnosis Date  . Cocaine abuse, continuous ([place])   . Diabetes mellitus    Hb A1C = 12.6 on 05/15/11, managed on Novolog 70/30, 35 U qam, 25 U qpm  . Hypertension    poorly controlled  . Insomnia disorder   . Shortness of breath    Past Surgical History:  Past Surgical History:  Procedure Laterality Date  . ANKLE FRACTURE SURGERY  2007   HPI:  61 yo female adm to [facility] 10/31 with AMS due to DKA.  PMH + for left pontine CVA, pna, cocaine use, ? erosive esopahgitis per CT imaging.  Swallow evaluation ordered, pt has h/o dysphagia and aspiration.    Assessment / Plan / Recommendation Clinical Impression  Pt with functional oropharyngeal swallow based on clinical evaluation and she denies dysphagia symptoms.  She does have h/o oropharyngeal dysphagia diagnosed during prior MBS studies - likely due to her pontine CVA, however currently she clinically is managing po.  No focal CN deficits and pt passed a 3 ounce water test without difficulites.  No indication of aspiration/penetration.    Pt does have h/o coughing when taking tablet during prior MBS 10/2016 - but this was not due to aspiration/penetration.    Of note, pt does admit to issues with vomiting/heart burn/sour taste prior to admit, and image study showed ? erosive esophagitis- suspect this as primary aspiration risk.    Provided pt with reflux precautions and reviewed findings of prior testing and compensation strategies.  No SLP follow up, thanks for this consult.Marland Kitchen   SLP Visit Diagnosis: Dysphagia, unspecified (R13.10)    Aspiration Risk  Mild aspiration risk    Diet Recommendation Dysphagia 3 (Mech soft);Thin liquid    Liquid Administration via: Cup;Straw Medication Administration:  (as tolerated) Supervision: Patient able to self feed Compensations: Slow rate;Small sips/bites Postural Changes: Seated upright at 90 degrees;Remain upright for at least 30 minutes after po intake    Other  Recommendations Oral Care Recommendations: Oral care BID   Follow up Recommendations None      Frequency and Duration            Prognosis        Swallow Study   General Date of Onset: 05/11/17 HPI: 61 yo female adm to [facility] 10/31 with AMS due to DKA.  PMH + for left pontine CVA, pna, cocaine use, ? erosive esopahgitis per CT imaging.  Swallow evaluation ordered, pt has h/o dysphagia and aspiration.  Type of Study: Bedside Swallow Evaluation Previous Swallow Assessment: MBS studies after pontine CVA and 10/2016 when admited with DKA - penetration thin, strong swallow, dys3/thin Diet Prior to this Study: Regular;Thin liquids (carb mod) Temperature Spikes Noted: No Respiratory Status: Room air History of Recent Intubation: No Behavior/Cognition: Alert;Cooperative;Pleasant mood;Impulsive Oral Cavity Assessment: Within Functional Limits Oral Care Completed by SLP: No Oral Cavity - Dentition: Edentulous Vision: Functional for self-feeding Self-Feeding Abilities: Able to feed self Patient Positioning: Upright in bed Baseline Vocal Quality: Normal Volitional Cough: Strong Volitional Swallow: Able to elicit    Oral/Motor/Sensory Function Overall Oral Motor/Sensory Function: Within functional limits   Ice Chips Ice chips: Not tested   Thin Liquid Thin Liquid: Within functional limits Presentation: Cup;Straw Other Comments: 3  ounce water test    Nectar Thick Nectar Thick Liquid: Not tested   Honey Thick Honey Thick Liquid: Not tested   Puree Puree: Within functional limits Presentation: Self Fed;Spoon   Solid   GO   Solid: Within functional limits Other Comments: pt used applesauce to aid "mastication"  of cracker due to lack of dentures        Macario Golds 05/11/2017,12:54 PM  [name], [place] [facility] SLP [phone]

## 2017-05-11 NOTE — Care Management Note (Signed)
Case Management Note  Patient Details  Name: [name] MRN: [id number] Date of Birth: 1956/05/20  Subjective/Objective:                  Hyperglycemia  Action/Plan: Date:  May 11, 2017 Chart reviewed for concurrent status and case management needs.  Will continue to follow patient progress.  Discharge Planning: following for needs  Expected discharge date: May 14, 2017  Velva Harman, BSN, [place], [place]   Expected Discharge Date:   (unknown)               Expected Discharge Plan:  Home/Self Care  In-House Referral:     Discharge planning Services  CM Consult  Post Acute Care Choice:    Choice offered to:     DME Arranged:    DME Agency:     HH Arranged:    HH Agency:     Status of Service:  In process, will continue to follow  If discussed at Long Length of Stay Meetings, dates discussed:    Additional Comments:  Leeroy Cha, RN 05/11/2017, 9:12 AM

## 2017-05-11 NOTE — Progress Notes (Signed)
[facility] PROGRESS NOTE  Wynn Banker DXA:[id number] DOB: 10/28/1955 DOA: 05/10/2017 PCP: Nolene Ebbs, MD  Interim summary and history of present illness. 61 y.o. 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  Admitted secondary to acute encephalopathy, DKA, presumed aspiration pneumonia and acute on chronic renal failure.  Assessment/Plan: 1-DKA -Resolved after IV fluids and insulin drip -Normal anion gap and bicarb currently -Diet has been advanced and patient transition to long-acting (Levemir) and a sliding scale insulin. -Will still continue IV fluids but will adjust rate. -Follow A1c  2-acute encephalopathy: Metabolic and toxic in nature -Patient with history of alcohol and drug abuse; presenting with concerns of aspiration pneumonia and acute DKA with significant electrolytes and sugar impairment. -Will use CIWA protocol; minimize/avoid use of narcotics or other sedating agents -continue treatment for infection and DKA  3-pneumonia: With concerns for aspiration in the presence of active vomiting (most likely from DKA process). -Continue Unasyn -As needed nebulizer -Follow supportive care and assess clinical response.  4-tobacco abuse, alcohol abuse and cocaine use -Cessation counseling has been provided -will use CIWA protocol -provide supportive care -continue thiamine and folic acid  5-hypertension -Continue as needed hydralazine -No beta-blockers given positive cocaine on UDS  6-hyponatremia/hyperkalemia -Electrolytes derangement associated with DKA and elevated CBGs -Continue IV fluids, continue insulin treatment and follow electrolytes trend  7-acute on chronic renal failure: Stage III in  nature -Secondary to dehydration in the presence of GI losses and DKA -No significant abnormalities appreciated on CT abdomen and pelvis -Continue IV fluid to minimize nephrotoxic agents -Follow creatinine trend.  8-history of seizure/ICH and CVA -No acute abnormalities appreciated on CT scan of the head -Continue Keppra  -follow seizure Precaution  9-chronic right lower leg wound -Actively followed by vascular surgery as an outpatient -We will follow wound care recommendations while inpatient -No signs of active infection  10-pressure injury/skin breakdown -will follow preventive measures, barrier cream and overlay mattress -PRN tylenol ordered for pain  Code Status: Full code Family Communication: No family at bedside Disposition Plan: We will keep in the stepdown for now; continue IV fluids, follow CBGs after transition of insulin drip and initiation of long-acting and sliding scale insulin; use CIWA protocol.   Consultants:  None  Procedures:  See below for x-ray reports.  Antibiotics:  Unasyn   HPI/Subjective: Afebrile, no nausea, no vomiting, no chest pain or shortness of breath.  Patient also denies abdominal pain.  Reports to be hungry and would like something to eat and drink.  She is oriented x2 currently.  Objective: Vitals:   05/11/17 1300 05/11/17 1500  BP: (!) 184/70 (!) 188/85  Pulse: (!) 110 (!) 107  Resp: 17 20  Temp:    SpO2: 98% 97%    Intake/Output Summary (Last 24 hours) at 05/11/17 1646 Last data filed at 05/11/17 1600  Gross per 24 hour  Intake          3667.12 ml  Output              575 ml  Net          3092.12 ml   Filed Weights   05/10/17 1427 05/11/17 0500  Weight: 100.7 kg (222 lb) 89.2 kg (196 lb 10.4 oz)    Exam:   General: Afebrile,  oriented x2, no chest pain, no abdominal pain.  Patient is asking for something to eat and drink.  Per nursing staff mild intermittent episodes of hallucinating (seeing things in the wound  there are no actually present).  Cardiovascular: Slight tachycardia, sinus on telemetry evaluation, no rubs, no gallops, no JVD.  Respiratory: Good air movement bilaterally, normal respiratory effort, no wheezing, no crackles; good oxygen saturation on room air.  Abdomen: Soft, nontender, positive bowel sounds, no guarding, no distention.  Musculoskeletal: Trace edema bilaterally, SCDs in place; no cyanosis or clubbing appreciated.  Skin: Stage II injury in her bottom; most likely associated with moisture and prolong periods in same position.  Present at the moment of admission.  Data Reviewed: Basic Metabolic Panel:  Recent Labs Lab 05/10/17 1244 05/10/17 1933 05/10/17 2329 05/11/17 0354  NA 125* 137 143 143  K 5.8* 4.0 3.5 3.4*  CL 83* 96* 105 108  CO2 10* 17* 23 25  GLUCOSE 1,112* 553* 247* 133*  BUN 52* 50* 48* 44*  CREATININE 3.56* 3.13* 2.60* 2.36*  CALCIUM 8.8* 9.2 8.7* 8.6*   Liver Function Tests:  Recent Labs Lab 05/10/17 1933  AST 19  ALT 15  ALKPHOS 70  BILITOT 1.2  PROT 9.4*  ALBUMIN 3.4*   CBC:  Recent Labs Lab 05/10/17 1244 05/11/17 0808  WBC 15.8* 13.1*  NEUTROABS 13.2*  --   HGB 12.4 12.4  HCT 38.4 36.1  MCV 97.7 90.3  PLT 358 311   CBG:  Recent Labs Lab 05/11/17 0403 05/11/17 0451 05/11/17 0640 05/11/17 0753 05/11/17 1128  GLUCAP 121* 148* 213* 217* 397*    Recent Results (from the past 240 hour(s))  Culture, blood (Routine X 2) w Reflex to ID Panel     Status: None (Preliminary result)   Collection Time: 05/10/17  2:15 PM  Result Value Ref Range Status   Specimen Description BLOOD RIGHT ANTECUBITAL  Final   Special Requests IN PEDIATRIC BOTTLE Blood Culture adequate volume  Final   Culture   Final    NO GROWTH < 24 HOURS Performed at [facility], [place] [address], [place], [place] [address]    Report Status PENDING  Incomplete  Culture, blood (Routine X 2) w Reflex to ID Panel     Status: None (Preliminary result)    Collection Time: 05/10/17  7:33 PM  Result Value Ref Range Status   Specimen Description BLOOD RIGHT HAND  Final   Special Requests IN PEDIATRIC BOTTLE Blood Culture adequate volume  Final   Culture   Final    NO GROWTH < 24 HOURS Performed at [facility], [place] [address], [place], [place] [address]    Report Status PENDING  Incomplete  MRSA PCR Screening     Status: None   Collection Time: 05/10/17  8:54 PM  Result Value Ref Range Status   MRSA by PCR NEGATIVE NEGATIVE Final    Comment:        The GeneXpert MRSA Assay (FDA approved for NASAL specimens only), is one component of a comprehensive MRSA colonization surveillance program. It is not intended to diagnose MRSA infection nor to guide or monitor treatment for MRSA infections.      Studies: Dg Chest 2 View  Result Date: 05/10/2017 CLINICAL DATA:  Hyperglycemia with confusion. Nausea and vomiting. Cough. EXAM: CHEST  2 VIEW COMPARISON:  October 31, 2016 and January 14, 2016 FINDINGS: There is a focal area of increased opacity in the right base, felt  to represent a small focus of pneumonia. There is chronic scarring in the lingula anteriorly. Lungs elsewhere clear. Heart is upper normal in size with pulmonary vascularity within normal limits. There is aortic atherosclerosis. No adenopathy. There is degenerative change in the right shoulder. IMPRESSION: Small area of presumed pneumonia in the right base. Scarring anterior lingula. Lungs elsewhere clear. Heart upper normal in size. Aortic atherosclerosis present. Aortic Atherosclerosis (ICD10-I70.0). Followup PA and lateral chest radiographs recommended in 3-4 weeks following trial of antibiotic therapy to ensure resolution and exclude underlying malignancy. Electronically Signed   By: Lowella Grip III M.D.   On: 05/10/2017 12:51   Ct Head Wo Contrast  Result Date: 05/10/2017 CLINICAL DATA:  Mental status changes.  Recent stroke. EXAM: CT HEAD WITHOUT CONTRAST TECHNIQUE:  Contiguous axial images were obtained from the base of the skull through the vertex without intravenous contrast. COMPARISON:  10/31/2016.  MRI 11/02/2016. FINDINGS: Brain: There is no evidence for acute hemorrhage, hydrocephalus, mass lesion, or abnormal extra-axial fluid collection. No definite CT evidence for acute infarction. Diffuse loss of parenchymal volume is consistent with atrophy. Patchy low attenuation in the deep hemispheric and periventricular white matter is nonspecific, but likely reflects chronic microvascular ischemic demyelination. Vascular: No hyperdense vessel or unexpected calcification. Skull: No evidence for fracture. No worrisome lytic or sclerotic lesion. Sinuses/Orbits: The visualized paranasal sinuses and mastoid air cells are clear. Visualized portions of the globes and intraorbital fat are unremarkable. Other: None. IMPRESSION: 1. No acute intracranial abnormality. 2. Atrophy with chronic small vessel white matter ischemic disease. Electronically Signed   By: Misty Stanley M.D.   On: 05/10/2017 18:09   Ct Chest Wo Contrast  Result Date: 05/10/2017 CLINICAL DATA:  61 year old female with history of diabetes presenting with several days of emesis. Concern for aspiration. EXAM: CT CHEST, ABDOMEN AND PELVIS WITHOUT CONTRAST TECHNIQUE: Multidetector CT imaging of the chest, abdomen and pelvis was performed following the standard protocol without IV contrast. COMPARISON:  Chest radiograph dated 05/10/2017 FINDINGS: Evaluation of this exam is limited in the absence of intravenous contrast. CT CHEST FINDINGS Cardiovascular: Are there is no cardiomegaly or pericardial effusion. Multi vessel coronary vascular calcification with involvement of the LAD, RCA, and left circumflex artery. There is moderate atherosclerotic calcification of the thoracic aorta. There is a mild dilatation of the main pulmonary trunk suggestive of underlying pulmonary hypertension. Evaluation of the vasculature is  limited in the absence of intravenous contrast. Mediastinum/Nodes: There is no hilar or mediastinal adenopathy. There is a small hiatal hernia. The esophagus appears thickened with small amount of debris. Findings may be reactive and represent inflammatory changes secondary to recurrent vomiting. Underlying esophageal infiltrative process is less likely but not entirely excluded. No mediastinal fluid collection or hematoma. No evidence of pneumomediastinum. Lungs/Pleura: The lungs are clear. There is no pleural effusion or pneumothorax. Minimal linear atelectatic changes of the left upper lobe noted. The central airways are patent. Musculoskeletal: There is degenerative changes of the spine shoulder. No acute osseous pathology. Focal area of apparent cortical discontinuity of the mid sternal body, likely artifactual and related to motion artifact. Correlation with clinical exam and point tenderness recommended. CT ABDOMEN PELVIS FINDINGS No intra-abdominal free air or free fluid. Hepatobiliary: No focal liver abnormality is seen. No gallstones, gallbladder wall thickening, or biliary dilatation. Pancreas: Unremarkable. No pancreatic ductal dilatation or surrounding inflammatory changes. Spleen: Normal in size without focal abnormality. Adrenals/Urinary Tract: The adrenal glands are unremarkable. There is no hydronephrosis or obstructing stone on either  side. Multiple small calcific densities in the right renal hilum most consistent with renovascular calcification. The visualized ureters and urinary bladder appear unremarkable. Stomach/Bowel: There is colonic diverticulosis without active inflammatory changes. There is no evidence of bowel obstruction or active inflammation. Normal appendix. Vascular/Lymphatic: There is advanced aortoiliac atherosclerotic disease. No portal venous gas identified. There is no adenopathy. Reproductive: The uterus is grossly unremarkable. No pelvic mass. The ovaries appear unremarkable  as visualized. Other: Small fat containing umbilical hernia. Musculoskeletal: There is osteopenia with degenerative changes of the spine and hips. No acute osseous pathology. IMPRESSION: 1. Mild thickened appearance of the esophagus which may represent reactive esophagitis related to recurrent or vomiting. Clinical correlation is recommended. No other acute intrathoracic, abdominal, or pelvic pathology. Specifically there is no evidence of aspiration. 2. No hydronephrosis or nephrolithiasis. 3. Scattered colonic diverticula. No bowel obstruction or active inflammation. Normal appendix. 4.  Aortic Atherosclerosis (ICD10-I70.0). Electronically Signed   By: Anner Crete M.D.   On: 05/10/2017 18:32   Ct Renal Stone Study  Result Date: 05/10/2017 CLINICAL DATA:  61 year old female with history of diabetes presenting with several days of emesis. Concern for aspiration. EXAM: CT CHEST, ABDOMEN AND PELVIS WITHOUT CONTRAST TECHNIQUE: Multidetector CT imaging of the chest, abdomen and pelvis was performed following the standard protocol without IV contrast. COMPARISON:  Chest radiograph dated 05/10/2017 FINDINGS: Evaluation of this exam is limited in the absence of intravenous contrast. CT CHEST FINDINGS Cardiovascular: Are there is no cardiomegaly or pericardial effusion. Multi vessel coronary vascular calcification with involvement of the LAD, RCA, and left circumflex artery. There is moderate atherosclerotic calcification of the thoracic aorta. There is a mild dilatation of the main pulmonary trunk suggestive of underlying pulmonary hypertension. Evaluation of the vasculature is limited in the absence of intravenous contrast. Mediastinum/Nodes: There is no hilar or mediastinal adenopathy. There is a small hiatal hernia. The esophagus appears thickened with small amount of debris. Findings may be reactive and represent inflammatory changes secondary to recurrent vomiting. Underlying esophageal infiltrative process  is less likely but not entirely excluded. No mediastinal fluid collection or hematoma. No evidence of pneumomediastinum. Lungs/Pleura: The lungs are clear. There is no pleural effusion or pneumothorax. Minimal linear atelectatic changes of the left upper lobe noted. The central airways are patent. Musculoskeletal: There is degenerative changes of the spine shoulder. No acute osseous pathology. Focal area of apparent cortical discontinuity of the mid sternal body, likely artifactual and related to motion artifact. Correlation with clinical exam and point tenderness recommended. CT ABDOMEN PELVIS FINDINGS No intra-abdominal free air or free fluid. Hepatobiliary: No focal liver abnormality is seen. No gallstones, gallbladder wall thickening, or biliary dilatation. Pancreas: Unremarkable. No pancreatic ductal dilatation or surrounding inflammatory changes. Spleen: Normal in size without focal abnormality. Adrenals/Urinary Tract: The adrenal glands are unremarkable. There is no hydronephrosis or obstructing stone on either side. Multiple small calcific densities in the right renal hilum most consistent with renovascular calcification. The visualized ureters and urinary bladder appear unremarkable. Stomach/Bowel: There is colonic diverticulosis without active inflammatory changes. There is no evidence of bowel obstruction or active inflammation. Normal appendix. Vascular/Lymphatic: There is advanced aortoiliac atherosclerotic disease. No portal venous gas identified. There is no adenopathy. Reproductive: The uterus is grossly unremarkable. No pelvic mass. The ovaries appear unremarkable as visualized. Other: Small fat containing umbilical hernia. Musculoskeletal: There is osteopenia with degenerative changes of the spine and hips. No acute osseous pathology. IMPRESSION: 1. Mild thickened appearance of the esophagus which may represent reactive esophagitis  related to recurrent or vomiting. Clinical correlation is  recommended. No other acute intrathoracic, abdominal, or pelvic pathology. Specifically there is no evidence of aspiration. 2. No hydronephrosis or nephrolithiasis. 3. Scattered colonic diverticula. No bowel obstruction or active inflammation. Normal appendix. 4.  Aortic Atherosclerosis (ICD10-I70.0). Electronically Signed   By: Anner Crete M.D.   On: 05/10/2017 18:32    Scheduled Meds: . atorvastatin  40 mg Oral q1800  . insulin aspart  0-15 Units Subcutaneous TID WC  . insulin aspart  0-5 Units Subcutaneous QHS  . insulin detemir  15 Units Subcutaneous Daily  . nicotine  21 mg Transdermal Q24H  . nystatin   Topical BID  . pantoprazole (PROTONIX) IV  40 mg Intravenous Q12H   Continuous Infusions: . sodium chloride 100 mL/hr at 05/11/17 0940  . sodium chloride     And  . sodium chloride    . ampicillin-sulbactam (UNASYN) IV Stopped (05/11/17 0626)  . levETIRAcetam Stopped (05/11/17 0815)     Time spent: 35 minutes    Barton Dubois  [facility] Pager [phone]. If 7PM-7AM, please contact night-coverage at [email or web address], password [facility] 05/11/2017, 4:46 PM  LOS: 1 day

## 2017-05-11 NOTE — Progress Notes (Signed)
Inpatient Diabetes Program Recommendations  AACE/ADA: New Consensus Statement on Inpatient Glycemic Control (2015)  Target Ranges:  Prepandial:   less than 140 mg/dL      Peak postprandial:   less than 180 mg/dL (1-2 hours)      Critically ill patients:  140 - 180 mg/dL   Spoke with patient about diabetes and home regimen for diabetes control. Patient reports that she is followed by a PCP for diabetes management. Patient reports taking her medication as prescribed. Patient reports that she just did not pay attention enough to call in her prescriptions. She mentions there is someone that comes out to the house everyday to check on her and her husband. Patient tells me she drinks a 12 pack of beer (Miller) everyday.  Writer of this note also has seen this once before in a previous note in the patient's chart. Patient reports having high glucose levels all the time. Writer helped to clean patient up in the ED yesterday and patients perineum was red tender and had a thick layer of grime. Patinet reports having a problem with yeast and that she wears pampers at home.  Discussed importance of checking CBGs and maintaining good CBG control to prevent long-term and short-term complications. Explained how hyperglycemia leads to damage within blood vessels which lead to the common complications seen with uncontrolled diabetes. Stressed to the patient the importance of improving glycemic control to prevent further complications from uncontrolled diabetes. Discussed impact of nutrition, exercise, stress, sickness, and medications on diabetes control. Patient has large wound on right leg. Patient reports at last PCP visit that she was suppose to be referred to a foot doctor and a vascular doctor. Patient has no further questions or needs at this time related to diabetes.   Thanks,  Tama Headings RN, MSN, [facility] Inpatient Diabetes Coordinator Team Pager [phone] (8a-5p)

## 2017-05-11 NOTE — Consult Note (Signed)
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 No depth to these wounds as they have dried and calloused and are actually above skin level. Wound bed: dried calloused skin Drainage (amount, consistency, odor) none Periwound:intact, dry Dressing procedure/placement/frequency: Patient at risk for wound infection, therefore will be conservative, I do not want these wounds to open up. With an ABI of 0.45 healing would be unlikely.  I have provided nurses with orders for painting wound on RLE BID with Betadine and leaving open to air. (under SCD leg wrap) Patient is restless in bed and moving legs around, will order keeping heels elevated instead of boots.  For  Intertriginous areas in both groins I have ordered [employer] Kellie Simmering # [phone]) with instructions for use. We will not follow, but will remain available to this patient, to nursing, and the medical and/or surgical teams.  Please re-consult if we need to assist further.   Fara Olden, RN-C, WTA-C, [facility] Care Associate

## 2017-05-11 NOTE — Progress Notes (Addendum)
LCSW consulted for home situation/care  Call placed to daughter: no working number Call placed to caregiver and message left. Will follow up with needs and information once calls returned.  Lane Hacker, MSW Clinical Social Work: [employer] Coverage for :  [phone]

## 2017-05-12 DIAGNOSIS — N189 Chronic kidney disease, unspecified: Secondary | ICD-10-CM

## 2017-05-12 LAB — URINE CULTURE: CULTURE: NO GROWTH

## 2017-05-12 LAB — GLUCOSE, CAPILLARY
GLUCOSE-CAPILLARY: 272 mg/dL — AB (ref 65–99)
GLUCOSE-CAPILLARY: 264 mg/dL — AB (ref 65–99)
GLUCOSE-CAPILLARY: 332 mg/dL — AB (ref 65–99)
GLUCOSE-CAPILLARY: 218 mg/dL — AB (ref 65–99)
Glucose-Capillary: 314 mg/dL — ABNORMAL HIGH (ref 65–99)
Glucose-Capillary: 197 mg/dL — ABNORMAL HIGH (ref 65–99)

## 2017-05-12 MED ORDER — INSULIN DETEMIR 100 UNIT/ML ~~LOC~~ SOLN
18.0000 [IU] | Freq: Two times a day (BID) | SUBCUTANEOUS | Status: DC
  Administered 2017-05-12: 18 [IU] via SUBCUTANEOUS
  Filled 2017-05-12 (×2): qty 0.18

## 2017-05-12 MED ORDER — LABETALOL HCL 5 MG/ML IV SOLN
10.0000 mg | Freq: Once | INTRAVENOUS | Status: AC
  Administered 2017-05-12: 10 mg via INTRAVENOUS
  Filled 2017-05-12: qty 4

## 2017-05-12 MED ORDER — LORAZEPAM 0.5 MG PO TABS
0.5000 mg | ORAL_TABLET | Freq: Once | ORAL | Status: AC
  Administered 2017-05-12: 0.5 mg via ORAL
  Filled 2017-05-12: qty 1

## 2017-05-12 MED ORDER — LABETALOL HCL 100 MG PO TABS
100.0000 mg | ORAL_TABLET | Freq: Two times a day (BID) | ORAL | Status: DC
  Administered 2017-05-12: 100 mg via ORAL
  Filled 2017-05-12: qty 1

## 2017-05-12 NOTE — Progress Notes (Signed)
[facility] PROGRESS NOTE  [name] IEP:[id number] DOB: 1955-09-13 DOA: 05/10/2017 PCP: Nolene Ebbs, MD  Interim summary and history of present illness. 61 y.o. 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  Admitted secondary to acute encephalopathy, DKA, presumed aspiration pneumonia and acute on chronic renal failure.  Assessment/Plan: 1-DKA -Resolved after IV fluids and insulin drip -CBG's still elevated -will increase levemir -continue SSI -A1C 13.2  2-acute encephalopathy: Metabolic and toxic in nature -Patient with history of alcohol and drug abuse; presenting with concerns of aspiration pneumonia and acute DKA with significant electrolytes and sugar impairment. -continue CIWA protocol; minimize/avoid use of narcotics or other sedating agents -continue treatment for infection and DKA -mentation is much improved.  3-pneumonia: With concerns for aspiration in the presence of active vomiting (most likely from DKA process). -will continue Unasyn -continue PRN nebulizer -Continue supportive care and follow clinical response.  4-tobacco abuse, alcohol abuse and cocaine use -Cessation counseling has been provided -continue CIWA protocol -continue thiamine and folic acid  5-hypertension -Continue as needed hydralazine -will use labetalol (safe in setting of cocaine use) -adjust dose as needed -continue hydralazine  6-hyponatremia/hyperkalemia -Electrolytes derangement associated with DKA and elevated CBGs -Continue IV fluids, continue insulin treatment and follow electrolytes trend -sodium level is now resolved.  7-acute on chronic renal failure: Stage III in nature -Secondary to dehydration in the  presence of GI losses and DKA -No significant abnormalities appreciated on CT abdomen and pelvis -Continue IV fluid  And minimize nephrotoxic agents -Follow creatinine trend.; trending down and close to baseline  8-history of seizure/ICH and CVA -No acute abnormalities appreciated on CT scan of the head -Continue Keppra  -follow seizure Precaution  9-chronic right lower leg wound -Actively followed by vascular surgery as an outpatient -We will follow wound care recommendations while inpatient -No signs of active infection or suppuration   10-pressure injury/skin breakdown -will follow preventive measures, barrier cream and overlay mattress -PRN tylenol ordered for pain  Code Status: Full code Family Communication: No family at bedside Disposition Plan: We will keep in the stepdown for another 24 hours; continue IV fluids, follow CBGs after transition of insulin drip and initiation of long-acting and sliding scale insulin; continue using CIWA protocol.  Consultants:  None  Procedures:  See below for x-ray reports.  Antibiotics:  Unasyn   HPI/Subjective: No fever, breathing better, denying CP, nausea, vomiting and abd pain.  Objective: Vitals:   05/12/17 1939 05/12/17 2039  BP: (!) 172/81   Pulse:    Resp:    Temp:  98.4 F (36.9 C)  SpO2:      Intake/Output Summary (Last 24 hours) at 05/12/17 2122 Last data filed at 05/12/17 1759  Gross per 24 hour  Intake             1000 ml  Output             3150 ml  Net            -2150 ml   Filed Weights   05/10/17 1427 05/11/17 0500 05/12/17 0349  Weight: 100.7 kg (222 lb) 89.2 kg (196 lb 10.4 oz) 95.6 kg (210 lb 12.2 oz)    Exam:   General: oriented X3, even mentation fluctuates; no CP, no SOB. Reports no nausea,  no vomiting. Afebrile, oriented x2, no chest pain, no abdominal pain.   Cardiovascular: sinus tachycardia, no rubs, no gallops, no JVD  Respiratory: good air movement, no wheezing, no  crackles  Abdomen: soft, NT, ND, positive BS  Musculoskeletal: trace edema, bilaterally, no cyanosis  Skin: Stage II injury in her bottom; most likely associated with moisture and prolong periods in same position.  Present at the moment of admission. Right leg with varicose wound, no superimposed infection appreciated.  Data Reviewed: Basic Metabolic Panel:  Recent Labs Lab 05/10/17 1244 05/10/17 1933 05/10/17 2329 05/11/17 0354  NA 125* 137 143 143  K 5.8* 4.0 3.5 3.4*  CL 83* 96* 105 108  CO2 10* 17* 23 25  GLUCOSE 1,112* 553* 247* 133*  BUN 52* 50* 48* 44*  CREATININE 3.56* 3.13* 2.60* 2.36*  CALCIUM 8.8* 9.2 8.7* 8.6*   Liver Function Tests:  Recent Labs Lab 05/10/17 1933  AST 19  ALT 15  ALKPHOS 70  BILITOT 1.2  PROT 9.4*  ALBUMIN 3.4*   CBC:  Recent Labs Lab 05/10/17 1244 05/11/17 0808  WBC 15.8* 13.1*  NEUTROABS 13.2*  --   HGB 12.4 12.4  HCT 38.4 36.1  MCV 97.7 90.3  PLT 358 311   CBG:  Recent Labs Lab 05/12/17 0806 05/12/17 1147 05/12/17 1211 05/12/17 1603 05/12/17 1659  GLUCAP 264* 314* 332* 197* 218*    Recent Results (from the past 240 hour(s))  Culture, blood (Routine X 2) w Reflex to ID Panel     Status: None (Preliminary result)   Collection Time: 05/10/17  2:15 PM  Result Value Ref Range Status   Specimen Description BLOOD RIGHT ANTECUBITAL  Final   Special Requests IN PEDIATRIC BOTTLE Blood Culture adequate volume  Final   Culture   Final    NO GROWTH 2 DAYS Performed at [facility], [place] [address] [place], [place] [address]    Report Status PENDING  Incomplete  Culture, Urine     Status: None   Collection Time: 05/10/17  5:48 PM  Result Value Ref Range Status   Specimen Description URINE, CLEAN CATCH  Final   Special Requests NONE  Final   Culture   Final    NO GROWTH Performed at [facility], [place] [address] [place], [place] [address]    Report Status 05/12/2017 FINAL  Final  Culture, blood (Routine  X 2) w Reflex to ID Panel     Status: None (Preliminary result)   Collection Time: 05/10/17  7:33 PM  Result Value Ref Range Status   Specimen Description BLOOD RIGHT HAND  Final   Special Requests IN PEDIATRIC BOTTLE Blood Culture adequate volume  Final   Culture   Final    NO GROWTH 2 DAYS Performed at [facility], [place] [address], [place], [place] [address]    Report Status PENDING  Incomplete  MRSA PCR Screening     Status: None   Collection Time: 05/10/17  8:54 PM  Result Value Ref Range Status   MRSA by PCR NEGATIVE NEGATIVE Final    Comment:        The GeneXpert MRSA Assay (FDA approved for NASAL specimens only), is one component of a comprehensive MRSA colonization surveillance program. It is not intended to diagnose MRSA infection nor to guide or monitor treatment for MRSA infections.      Studies: No results found.  Scheduled Meds: . atorvastatin  40 mg Oral q1800  . folic  acid  1 mg Oral Daily  . insulin aspart  0-15 Units Subcutaneous TID WC  . insulin aspart  0-5 Units Subcutaneous QHS  . insulin detemir  18 Units Subcutaneous BID  . labetalol  100 mg Oral BID  . multivitamin with minerals  1 tablet Oral Daily  . nicotine  21 mg Transdermal Q24H  . nystatin   Topical BID  . pantoprazole (PROTONIX) IV  40 mg Intravenous Q12H  . thiamine  100 mg Oral Daily   Or  . thiamine  100 mg Intravenous Daily   Continuous Infusions: . sodium chloride 100 mL/hr at 05/12/17 0600  . sodium chloride     And  . sodium chloride    . ampicillin-sulbactam (UNASYN) IV 3 g (05/12/17 1940)  . levETIRAcetam 500 mg (05/12/17 1940)     Time spent: 30 minutes    Barton Dubois  [facility] Pager [phone]. If 7PM-7AM, please contact night-coverage at [email or web address], password [facility] 05/12/2017, 9:22 PM  LOS: 2 days

## 2017-05-13 DIAGNOSIS — J690 Pneumonitis due to inhalation of food and vomit: Secondary | ICD-10-CM

## 2017-05-13 DIAGNOSIS — R410 Disorientation, unspecified: Secondary | ICD-10-CM

## 2017-05-13 LAB — GLUCOSE, CAPILLARY
GLUCOSE-CAPILLARY: 421 mg/dL — AB (ref 65–99)
GLUCOSE-CAPILLARY: 355 mg/dL — AB (ref 65–99)
GLUCOSE-CAPILLARY: 211 mg/dL — AB (ref 65–99)
Glucose-Capillary: 225 mg/dL — ABNORMAL HIGH (ref 65–99)
Glucose-Capillary: 204 mg/dL — ABNORMAL HIGH (ref 65–99)

## 2017-05-13 LAB — CBC
HCT: 36.1 % (ref 36.0–46.0)
Hemoglobin: 12.2 g/dL (ref 12.0–15.0)
MCH: 31 pg (ref 26.0–34.0)
MCHC: 33.8 g/dL (ref 30.0–36.0)
MCV: 91.9 fL (ref 78.0–100.0)
PLATELETS: 293 10*3/uL (ref 150–400)
RBC: 3.93 MIL/uL (ref 3.87–5.11)
RDW: 12.6 % (ref 11.5–15.5)
WBC: 11.4 10*3/uL — AB (ref 4.0–10.5)

## 2017-05-13 LAB — BASIC METABOLIC PANEL
Anion gap: 12 (ref 5–15)
BUN: 11 mg/dL (ref 6–20)
CALCIUM: 8.1 mg/dL — AB (ref 8.9–10.3)
CO2: 20 mmol/L — ABNORMAL LOW (ref 22–32)
CREATININE: 1.12 mg/dL — AB (ref 0.44–1.00)
Chloride: 105 mmol/L (ref 101–111)
GFR calc Af Amer: >60 mL/min (ref >60)
GFR, EST NON AFRICAN AMERICAN: 52 mL/min — AB (ref >60)
GLUCOSE: 338 mg/dL — AB (ref 65–99)
POTASSIUM: 3.4 mmol/L — AB (ref 3.5–5.1)
SODIUM: 137 mmol/L (ref 135–145)

## 2017-05-13 LAB — HEMOGLOBIN A1C
Hgb A1c MFr Bld: >15.5 % — ABNORMAL HIGH (ref 4.8–5.6)
Mean Plasma Glucose: >398 mg/dL

## 2017-05-13 MED ORDER — INSULIN DETEMIR 100 UNIT/ML ~~LOC~~ SOLN
20.0000 [IU] | Freq: Two times a day (BID) | SUBCUTANEOUS | Status: DC
  Filled 2017-05-13: qty 0.2

## 2017-05-13 MED ORDER — INSULIN DETEMIR 100 UNIT/ML ~~LOC~~ SOLN
22.0000 [IU] | Freq: Two times a day (BID) | SUBCUTANEOUS | Status: DC
  Administered 2017-05-13 – 2017-05-14 (×3): 22 [IU] via SUBCUTANEOUS
  Filled 2017-05-13 (×4): qty 0.22

## 2017-05-13 MED ORDER — LORAZEPAM 2 MG/ML IJ SOLN
INTRAMUSCULAR | Status: AC
  Administered 2017-05-13: 2 mg
  Filled 2017-05-13: qty 1

## 2017-05-13 MED ORDER — INSULIN ASPART 100 UNIT/ML ~~LOC~~ SOLN
20.0000 [IU] | Freq: Once | SUBCUTANEOUS | Status: AC
  Administered 2017-05-13: 100 [IU] via SUBCUTANEOUS

## 2017-05-13 MED ORDER — LABETALOL HCL 200 MG PO TABS
200.0000 mg | ORAL_TABLET | Freq: Two times a day (BID) | ORAL | Status: DC
  Administered 2017-05-13 – 2017-05-15 (×5): 200 mg via ORAL
  Filled 2017-05-13 (×6): qty 1

## 2017-05-13 MED ORDER — SODIUM CHLORIDE 0.9 % IV SOLN
3.0000 g | Freq: Four times a day (QID) | INTRAVENOUS | Status: DC
  Administered 2017-05-13 – 2017-05-14 (×5): 3 g via INTRAVENOUS
  Filled 2017-05-13 (×6): qty 3

## 2017-05-13 NOTE — Progress Notes (Signed)
Pharmacy Antibiotic Note  [name] is a 61 y.o. female admitted on 05/10/2017 with aspiration PNA.  Pharmacy has been consulted for Unasyn dosing.  Plan:  Change unasyn to 3 gr IV q6h with improved renal function  Monitor clinical course, renal function, cultures as available   Height: 5\' 8"  (172.7 cm) Weight: 218 lb 0.6 oz (98.9 kg) IBW/kg (Calculated) : 63.9  Temp (24hrs), Avg:98.6 F (37 C), Min:98.1 F (36.7 C), Max:99.3 F (37.4 C)  Recent Labs  Lab 05/10/17 1244 05/10/17 1421 05/10/17 1933 05/10/17 2329 05/11/17 0354 05/11/17 0808 05/13/17 0403  WBC 15.8*  --   --   --   --  13.1* 11.4*  CREATININE 3.56*  --  3.13* 2.60* 2.36*  --  1.12*  LATICACIDVEN  --  2.37* 3.0*  --   --   --   --     Estimated Creatinine Clearance: 65.7 mL/min (A) (by C-G formula based on SCr of 1.12 mg/dL (H)).    No Known Allergies  Antimicrobials this admission: 11/1 Ceftriaxone/Azithromycin  x1 in ED 11/1 Unasyn >>   Dose adjustments this admission: 11/4 change Unasyn from 3 gr IV q12h to 3 gr IV q6h  Microbiology results: 11/1 BCx: NGTD 11/2 Urine: NGF 11/1 MRSA PCR: negative  Thank you for allowing pharmacy to be a part of this patient's care.  Royetta Asal, PharmD, BCPS Pager [phone] 05/13/2017 7:32 AM

## 2017-05-13 NOTE — Progress Notes (Signed)
Patient does not want foley to be removed due to redness and irritation between her legs.

## 2017-05-13 NOTE — Progress Notes (Signed)
Dr Dyann Kief notifed of [phone]. Orders received.

## 2017-05-13 NOTE — Progress Notes (Signed)
Dr Percell Boston notifed of CBG 355. It is okay to transfer pt to telemetry. Also it is fine to leave foley in today and continue good perineum care.

## 2017-05-13 NOTE — Progress Notes (Signed)
[facility] PROGRESS NOTE  [name] UJW:[id number] DOB: [birth year] DOA: 05/10/2017 PCP: Nolene Ebbs, MD  Interim summary and history of present illness. 60 y.o. 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  Admitted secondary to acute encephalopathy, DKA, presumed aspiration pneumonia and acute on chronic renal failure.  Assessment/Plan: 1-DKA -Resolved after IV fluids and insulin drip -CBG's still elevated and fluctuating, but improved overall -will continue adjusting levemir, continue SSI and follow modified carb diet -A1C 13.2  2-acute encephalopathy: Metabolic and toxic in nature -Patient with history of alcohol and drug abuse; presenting with concerns of aspiration pneumonia and acute DKA with significant electrolytes and sugar impairment. -mentation improved and on my exam oriented X 3 -continue CIWA protocol; minimize/avoid use of narcotics or other sedating agents -continue treatment for infection with current antibiotics  3-pneumonia: With concerns for aspiration in the presence of active vomiting (most likely from DKA process). -continue Unasyn, looking to transition to PO abx;s in the next 24 hours -continue PRN nebulizer -Continue supportive care and follow clinical response.  4-tobacco abuse, alcohol abuse and cocaine use -Cessation counseling has been provided -continue CIWA protocol -continue thiamine and folic acid  5-hypertension -Continue as needed hydralazine -will continue using labetalol (safe in setting of cocaine use) -follow VS and adjust dose as needed  6-hyponatremia/hyperkalemia -Electrolytes derangement associated with DKA and elevated CBGs -Continue IV fluids, continue insulin  treatment and follow electrolytes trend -sodium level and K WNL now.  7-acute on chronic renal failure: Stage III in nature -Secondary to dehydration in the presence of GI losses and DKA -No significant abnormalities appreciated on CT abdomen and pelvis -Continue IV fluid  And minimize nephrotoxic agents -Follow creatinine trend.; Cr continue trending down and close to baseline  8-history of seizure/ICH and CVA -No acute abnormalities appreciated on CT scan of the head -Continue Keppra  -follow seizure Precaution  9-chronic right lower leg wound -Actively followed by vascular surgery as an outpatient -We will follow wound care recommendations while inpatient -No signs of active infection or suppuration   10-pressure injury/skin breakdown -will follow preventive measures, barrier cream and overlay mattress -PRN tylenol ordered for pain -will keep foley in for one more day  Code Status: Full code Family Communication: No family at bedside Disposition Plan: transfer to telemetry; continue IV antibiotics and antihypertensive regimen. Follow CBGs and continue adjusting hypoglycemic regimen. Continue using CIWA protocol.  Consultants:  None  Procedures:  See below for x-ray reports.  Antibiotics:  Unasyn   HPI/Subjective: AAOX3, no CP, no nausea, no vomiting and no abd pain. Overall feeling better  Objective: Vitals:   05/13/17 0900 05/13/17 1000  BP: (!) 183/68 134/70  Pulse: (!) 120 99  Resp: (!) 26 20  Temp:    SpO2: 97% 96%    Intake/Output Summary (Last 24 hours) at 05/13/2017 1108 Last data filed at 05/13/2017 1000 Gross per 24 hour  Intake 3305 ml  Output 4450 ml  Net -1145 ml   Filed Weights   05/11/17 0500 05/12/17 0349 05/13/17 0357  Weight: 89.2 kg (196 lb 10.4 oz) 95.6 kg (210 lb 12.2 oz) 98.9 kg (218 lb 0.6 oz)    Exam:   General: AAOX3; no CP, no SOB, no nausea, no vomiting.   Cardiovascular: RRR currently, no rubs,  no gallops, no  JVD  Respiratory: no wheezing, no crackles, positive scattered rhonchi.   Abdomen: soft, NT, ND, positive BS.   Musculoskeletal: no cyanosis, no clubbing   Skin: Stage II injury in her bottom; most likely associated with moisture and prolong periods in same position. Has been Present since the moment of admission. Right leg with varicose wound, no superimposed infection appreciated.  Data Reviewed: Basic Metabolic Panel: Recent Labs  Lab 05/10/17 1244 05/10/17 1933 05/10/17 2329 05/11/17 0354 05/13/17 0403  NA 125* 137 143 143 137  K 5.8* 4.0 3.5 3.4* 3.4*  CL 83* 96* 105 108 105  CO2 10* 17* 23 25 20*  GLUCOSE 1,112* 553* 247* 133* 338*  BUN 52* 50* 48* 44* 11  CREATININE 3.56* 3.13* 2.60* 2.36* 1.12*  CALCIUM 8.8* 9.2 8.7* 8.6* 8.1*   Liver Function Tests: Recent Labs  Lab 05/10/17 1933  AST 19  ALT 15  ALKPHOS 70  BILITOT 1.2  PROT 9.4*  ALBUMIN 3.4*   CBC: Recent Labs  Lab 05/10/17 1244 05/11/17 0808 05/13/17 0403  WBC 15.8* 13.1* 11.4*  NEUTROABS 13.2*  --   --   HGB 12.4 12.4 12.2  HCT 38.4 36.1 36.1  MCV 97.7 90.3 91.9  PLT 358 311 293   CBG: Recent Labs  Lab 05/12/17 1211 05/12/17 1603 05/12/17 1659 05/12/17 2142 05/13/17 0753  GLUCAP 332* 197* 218* 225* 421*    Recent Results (from the past 240 hour(s))  Culture, blood (Routine X 2) w Reflex to ID Panel     Status: None (Preliminary result)   Collection Time: 05/10/17  2:15 PM  Result Value Ref Range Status   Specimen Description BLOOD RIGHT ANTECUBITAL  Final   Special Requests IN PEDIATRIC BOTTLE Blood Culture adequate volume  Final   Culture   Final    NO GROWTH 2 DAYS Performed at [facility], [address] [place], [place] [address]    Report Status PENDING  Incomplete  Culture, Urine     Status: None   Collection Time: 05/10/17  5:48 PM  Result Value Ref Range Status   Specimen Description URINE, CLEAN CATCH  Final   Special Requests NONE  Final   Culture   Final     NO GROWTH Performed at [facility], [place] [address], [place], [place] [address]    Report Status 05/12/2017 FINAL  Final  Culture, blood (Routine X 2) w Reflex to ID Panel     Status: None (Preliminary result)   Collection Time: 05/10/17  7:33 PM  Result Value Ref Range Status   Specimen Description BLOOD RIGHT HAND  Final   Special Requests IN PEDIATRIC BOTTLE Blood Culture adequate volume  Final   Culture   Final    NO GROWTH 2 DAYS Performed at [facility], [place] [address] [place], [place] [address]    Report Status PENDING  Incomplete  MRSA PCR Screening     Status: None   Collection Time: 05/10/17  8:54 PM  Result Value Ref Range Status   MRSA by PCR NEGATIVE NEGATIVE Final    Comment:        The GeneXpert MRSA Assay (FDA approved for NASAL specimens only), is one component of a comprehensive MRSA colonization surveillance program. It is not intended to diagnose MRSA infection nor to guide or monitor treatment for MRSA infections.      Studies: No results found.  Scheduled Meds: . atorvastatin  40 mg  Oral q1800  . folic acid  1 mg Oral Daily  . insulin aspart  0-15 Units Subcutaneous TID WC  . insulin aspart  0-5 Units Subcutaneous QHS  . insulin detemir  22 Units Subcutaneous BID  . labetalol  200 mg Oral BID  . multivitamin with minerals  1 tablet Oral Daily  . nicotine  21 mg Transdermal Q24H  . nystatin   Topical BID  . pantoprazole (PROTONIX) IV  40 mg Intravenous Q12H  . thiamine  100 mg Oral Daily   Or  . thiamine  100 mg Intravenous Daily   Continuous Infusions: . sodium chloride 75 mL/hr at 05/13/17 0800  . sodium chloride     And  . sodium chloride    . ampicillin-sulbactam (UNASYN) IV 3 g (05/13/17 0847)  . levETIRAcetam 500 mg (05/13/17 0816)     Time spent: 30 minutes    Barton Dubois  [facility] Pager [phone]. If 7PM-7AM, please contact night-coverage at [email or web address], password [facility] 05/13/2017, 11:08 AM   LOS: 3 days

## 2017-05-14 LAB — GLUCOSE, CAPILLARY
Glucose-Capillary: 217 mg/dL — ABNORMAL HIGH (ref 65–99)
Glucose-Capillary: 282 mg/dL — ABNORMAL HIGH (ref 65–99)
Glucose-Capillary: 221 mg/dL — ABNORMAL HIGH (ref 65–99)

## 2017-05-14 MED ORDER — INSULIN ASPART 100 UNIT/ML ~~LOC~~ SOLN
0.0000 [IU] | Freq: Three times a day (TID) | SUBCUTANEOUS | Status: DC
  Administered 2017-05-15: 4 [IU] via SUBCUTANEOUS

## 2017-05-14 MED ORDER — AMOXICILLIN-POT CLAVULANATE 875-125 MG PO TABS
1.0000 | ORAL_TABLET | Freq: Two times a day (BID) | ORAL | Status: DC
  Administered 2017-05-14 – 2017-05-15 (×3): 1 via ORAL
  Filled 2017-05-14 (×4): qty 1

## 2017-05-14 MED ORDER — CYCLOBENZAPRINE HCL 5 MG PO TABS
5.0000 mg | ORAL_TABLET | Freq: Once | ORAL | Status: AC
  Administered 2017-05-14: 5 mg via ORAL
  Filled 2017-05-14: qty 1

## 2017-05-14 MED ORDER — INSULIN DETEMIR 100 UNIT/ML ~~LOC~~ SOLN
26.0000 [IU] | Freq: Two times a day (BID) | SUBCUTANEOUS | Status: DC
  Administered 2017-05-14 – 2017-05-15 (×2): 26 [IU] via SUBCUTANEOUS
  Filled 2017-05-14 (×4): qty 0.26

## 2017-05-14 MED ORDER — PANTOPRAZOLE SODIUM 40 MG PO TBEC
40.0000 mg | DELAYED_RELEASE_TABLET | Freq: Two times a day (BID) | ORAL | Status: DC
  Administered 2017-05-15 (×2): 40 mg via ORAL
  Filled 2017-05-14 (×3): qty 1

## 2017-05-14 MED ORDER — LEVETIRACETAM 500 MG PO TABS
500.0000 mg | ORAL_TABLET | Freq: Two times a day (BID) | ORAL | Status: DC
  Administered 2017-05-14 – 2017-05-15 (×3): 500 mg via ORAL
  Filled 2017-05-14 (×4): qty 1

## 2017-05-14 NOTE — Evaluation (Signed)
Physical Therapy Evaluation Patient Details Name: [name] MRN: [id number] DOB: 1956/02/07 Today's Date: 05/14/2017   History of Present Illness  61 y.o. 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  Pt admitted secondary to acute encephalopathy, DKA, presumed aspiration pneumonia and acute on chronic renal failure.  Clinical Impression  Pt admitted with above diagnosis. Pt currently with functional limitations due to the deficits listed below (see PT Problem List).  Pt will benefit from skilled PT to increase their independence and safety with mobility to allow discharge to the venue listed below.   Pt requires increased time and has effortful mobility.  Pt reports pain is limiting mobility.  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  Pt unable to tolerate standing today.  Recommend SNF upon d/c at this time.     Follow Up Recommendations SNF    Equipment Recommendations  None recommended by PT    Recommendations for Other Services       Precautions / Restrictions Precautions Precautions: Fall Restrictions Weight Bearing Restrictions: No      Mobility  Bed Mobility Overal bed mobility: Needs Assistance Bed Mobility: Supine to Sit;Sit to Supine     Supine to sit: Mod assist;HOB elevated Sit to supine: Min guard   General bed mobility comments: pt requiring assist for LEs over EOB and trunk upright  Transfers Overall transfer level: Needs assistance Equipment used: Rolling walker (2 wheeled) Transfers: Sit to/from Stand Sit to Stand: Mod assist;From elevated surface         General transfer comment: verbal cues for hand placement, pt attempted twice however unable to complete trunk extension  and upright posture, reports R LE pain limiting  Ambulation/Gait                Stairs            Wheelchair Mobility    Modified Rankin (Stroke Patients Only)       Balance Overall balance assessment: Needs assistance Sitting-balance support: No upper extremity supported;Feet supported Sitting balance-Leahy Scale: Fair         Standing balance comment: requires total assist to stand at this time due to pain                             Pertinent Vitals/Pain Pain Assessment: Faces Faces Pain Scale: Hurts even more Pain Location: R leg, reports pain is in dressing area Pain Descriptors / Indicators: Tender;Grimacing;Sore Pain Intervention(s): Limited activity within patient's tolerance;Monitored during session;Repositioned    Home Living Family/patient expects to be discharged to:: Private residence Living Arrangements: Spouse/significant other     Home Access: Level entry     Home Layout: One level Home Equipment: Walker - 2 wheels      Prior Function Level of Independence: Independent         Comments: Takes care of her significant other providing physical assistance for his ADL.     Hand Dominance        Extremity/Trunk Assessment        Lower Extremity Assessment Lower Extremity Assessment: Generalized weakness       Communication   Communication: No difficulties  Cognition Arousal/Alertness: Awake/alert Behavior During Therapy: WFL for tasks assessed/performed Overall Cognitive Status:  Within Functional Limits for tasks assessed                                        General Comments      Exercises     Assessment/Plan    PT Assessment Patient needs continued PT services  PT Problem List Decreased strength;Decreased mobility;Decreased activity tolerance;Decreased balance;Decreased knowledge of use of DME;Pain       PT Treatment Interventions DME instruction;Therapeutic activities;Cognitive  remediation;Gait training;Therapeutic exercise;Patient/family education;Functional mobility training;Balance training;Wheelchair mobility training    PT Goals (Current goals can be found in the Care Plan section)  Acute Rehab PT Goals PT Goal Formulation: With patient Time For Goal Achievement: 05/28/17 Potential to Achieve Goals: Fair    Frequency Min 2X/week   Barriers to discharge        Co-evaluation               AM-PAC PT "6 Clicks" Daily Activity  Outcome Measure Difficulty turning over in bed (including adjusting bedclothes, sheets and blankets)?: A Lot Difficulty moving from lying on back to sitting on the side of the bed? : Unable Difficulty sitting down on and standing up from a chair with arms (e.g., wheelchair, bedside commode, etc,.)?: Unable Help needed moving to and from a bed to chair (including a wheelchair)?: Total Help needed walking in hospital room?: Total Help needed climbing 3-5 steps with a railing? : Total 6 Click Score: 7    End of Session Equipment Utilized During Treatment: Gait belt Activity Tolerance: Patient limited by pain Patient left: in bed;with bed alarm set;with call bell/phone within reach Nurse Communication: Mobility status PT Visit Diagnosis: Difficulty in walking, not elsewhere classified (R26.2)    Time: 7322-0254 PT Time Calculation (min) (ACUTE ONLY): 34 min   Charges:   PT Evaluation $PT Eval Low Complexity: 1 Low PT Treatments $Therapeutic Activity: 8-22 mins   PT G Codes:        Carmelia Bake, PT, DPT 05/14/2017 Pager: [phone]  [name] 05/14/2017, 12:32 PM

## 2017-05-14 NOTE — Progress Notes (Signed)
[facility] PROGRESS NOTE  [name] QVZ:[id number] DOB: February 05, 1956 DOA: 05/10/2017 PCP: Nolene Ebbs, MD  Interim summary and history of present illness. 60 y.o. 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  Admitted secondary to acute encephalopathy, DKA, presumed aspiration pneumonia and acute on chronic renal failure.  Assessment/Plan: 1-DKA -Resolved after IV fluids and insulin drip -CBG's still elevated and fluctuating, but improved overall -will continue adjusting levemir, continue SSI (changed to resistant) and follow modified carb diet -A1C 13.2  2-acute encephalopathy: Metabolic and toxic in nature -Patient with history of alcohol and drug abuse; presenting with concerns of aspiration pneumonia and acute DKA with significant electrolytes and sugar impairment. -mentation improved and at baseline -patient AAOX3 -continue CIWA protocol; minimize/avoid use of narcotics or other sedative agents -continue treatment for infection with current antibiotics (see below)  3-pneumonia: With concerns for aspiration in the presence of active vomiting (most likely from DKA process). -transition antibiotics to PO (will use augmentin to complete therapy) -continue PRN nebulizer -Continue supportive care and follow clinical response.  4-tobacco abuse, alcohol abuse and cocaine use -Cessation counseling has been provided -continue CIWA protocol -continue thiamine and folic acid -no withdrawal appreciated  5-hypertension -Continue as needed hydralazine -will continue using labetalol (safe in setting of cocaine use) -follow VS and continue adjusting dose as needed  6-hyponatremia/hyperkalemia -Electrolytes derangement  associated with DKA and elevated CBGs -Continue IV fluids, continue insulin treatment and follow electrolytes trend. -sodium level and K WNL now.  7-acute on chronic renal failure: Stage III in nature -Secondary to dehydration in the presence of GI losses and DKA -No significant abnormalities appreciated on CT abdomen and pelvis -Continue IV fluid, but adjust rate as patient eating/drinking more -continue minimizing nephrotoxic agents    -Follow creatinine trend.  8-history of seizure/ICH and CVA -No acute abnormalities appreciated on CT scan of the head -Continue Keppra  -follow seizure Precaution  9-chronic right lower leg wound -Actively followed by vascular surgery as an outpatient -Will follow wound care recommendations while inpatient -No signs of active infection or purulent suppuration   10-pressure injury/skin breakdown -will follow preventive measures, barrier cream and overlay mattress -PRN tylenol ordered for pain -ok to d/c foley.  Code Status: Full code Family Communication: No family at bedside Disposition Plan: continue monitoring on telemetry, transition antibiotics to PO. Follow CBGs and continue adjusting hypoglycemic regimen. Continue using CIWA protocol.  Consultants:  None  Procedures:  See below for x-ray reports.  Antibiotics:  Unasyn   HPI/Subjective: No fever, no CP, no SOB. Oriented X 3 and overall feeling better, patient still with CBG's fluctuation.  Objective: Vitals:   05/14/17 0555 05/14/17 1509  BP: (!) 164/71 (!) 170/85  Pulse: 96 100  Resp: 18 18  Temp: 98.8 F (37.1 C) 98.5 F (36.9 C)  SpO2: 97% 98%    Intake/Output Summary (Last 24 hours) at 05/14/2017 2139 Last data filed at 05/14/2017 0600 Gross per 24 hour  Intake 1850 ml  Output 1200 ml  Net 650 ml   Filed Weights   05/11/17 0500 05/12/17 0349 05/13/17 0357  Weight: 89.2 kg (196 lb 10.4 oz) 95.6 kg (210 lb 12.2 oz) 98.9 kg (218 lb 0.6 oz)     Exam:   General: oriented, X3, no fever, no nausea,  no vomiting, no abd pain, no CP or SOB.  Cardiovascular: RRR, no rubs, no gallops, no JVD.  Respiratory: normal resp effort, good O2 sat on RA, no wheezing, no crackles.  Abdomen: soft, NT, ND, positive BS   Musculoskeletal: no cyanosis, no clubbing, tender to palpation.  Skin: exam in her skin essentially unchanged since 11/4; Stage II injury in her bottom; most likely associated with moisture and prolong periods in same position. Has been Present since the moment of admission. Right leg with varicose wound, no superimposed infection appreciated, no drainage.  Data Reviewed: Basic Metabolic Panel: Recent Labs  Lab 05/10/17 1244 05/10/17 1933 05/10/17 2329 05/11/17 0354 05/13/17 0403  NA 125* 137 143 143 137  K 5.8* 4.0 3.5 3.4* 3.4*  CL 83* 96* 105 108 105  CO2 10* 17* 23 25 20*  GLUCOSE 1,112* 553* 247* 133* 338*  BUN 52* 50* 48* 44* 11  CREATININE 3.56* 3.13* 2.60* 2.36* 1.12*  CALCIUM 8.8* 9.2 8.7* 8.6* 8.1*   Liver Function Tests: Recent Labs  Lab 05/10/17 1933  AST 19  ALT 15  ALKPHOS 70  BILITOT 1.2  PROT 9.4*  ALBUMIN 3.4*   CBC: Recent Labs  Lab 05/10/17 1244 05/11/17 0808 05/13/17 0403  WBC 15.8* 13.1* 11.4*  NEUTROABS 13.2*  --   --   HGB 12.4 12.4 12.2  HCT 38.4 36.1 36.1  MCV 97.7 90.3 91.9  PLT 358 311 293   CBG: Recent Labs  Lab 05/13/17 1859 05/13/17 2119 05/14/17 0754 05/14/17 1121 05/14/17 1841  GLUCAP 211* 204* 217* 282* 221*    Recent Results (from the past 240 hour(s))  Culture, blood (Routine X 2) w Reflex to ID Panel     Status: None (Preliminary result)   Collection Time: 05/10/17  2:15 PM  Result Value Ref Range Status   Specimen Description BLOOD RIGHT ANTECUBITAL  Final   Special Requests IN PEDIATRIC BOTTLE Blood Culture adequate volume  Final   Culture   Final    NO GROWTH 4 DAYS Performed at [facility], [place] [address], [place], [place] [address]     Report Status PENDING  Incomplete  Culture, Urine     Status: None   Collection Time: 05/10/17  5:48 PM  Result Value Ref Range Status   Specimen Description URINE, CLEAN CATCH  Final   Special Requests NONE  Final   Culture   Final    NO GROWTH Performed at [facility], [place] [address] [place], [place] [address]    Report Status 05/12/2017 FINAL  Final  Culture, blood (Routine X 2) w Reflex to ID Panel     Status: None (Preliminary result)   Collection Time: 05/10/17  7:33 PM  Result Value Ref Range Status   Specimen Description BLOOD RIGHT HAND  Final   Special Requests IN PEDIATRIC BOTTLE Blood Culture adequate volume  Final   Culture   Final    NO GROWTH 4 DAYS Performed at [facility], [place] [address], [place], [place] [address]    Report Status PENDING  Incomplete  MRSA PCR Screening     Status: None   Collection Time: 05/10/17  8:54 PM  Result Value Ref Range Status   MRSA by PCR NEGATIVE NEGATIVE Final    Comment:        The GeneXpert MRSA Assay (FDA approved for NASAL specimens only), is one component of a comprehensive MRSA colonization surveillance program. It is not intended to  diagnose MRSA infection nor to guide or monitor treatment for MRSA infections.      Studies: No results found.  Scheduled Meds: . amoxicillin-clavulanate  1 tablet Oral Q12H  . atorvastatin  40 mg Oral q1800  . folic acid  1 mg Oral Daily  . [START ON 05/15/2017] insulin aspart  0-20 Units Subcutaneous TID WC  . insulin aspart  0-5 Units Subcutaneous QHS  . insulin detemir  26 Units Subcutaneous BID  . labetalol  200 mg Oral BID  . levETIRAcetam  500 mg Oral BID  . multivitamin with minerals  1 tablet Oral Daily  . nicotine  21 mg Transdermal Q24H  . nystatin   Topical BID  . pantoprazole  40 mg Oral BID  . thiamine  100 mg Oral Daily   Continuous Infusions: . sodium chloride 75 mL/hr at 05/14/17 0603  . sodium chloride     And  . sodium chloride        Time spent: 25 minutes    Barton Dubois  [facility] Pager [phone]. If 7PM-7AM, please contact night-coverage at [email or web address], password [facility] 05/14/2017, 9:39 PM  LOS: 4 days

## 2017-05-14 NOTE — Progress Notes (Signed)
Inpatient Diabetes Program Recommendations  AACE/ADA: New Consensus Statement on Inpatient Glycemic Control (2015)  Target Ranges:  Prepandial:   less than 140 mg/dL      Peak postprandial:   less than 180 mg/dL (1-2 hours)      Critically ill patients:  140 - 180 mg/dL   Lab Results  Component Value Date   GLUCAP 204 (H) 05/13/2017   HGBA1C 13.2 (H) 05/11/2017    Review of Glycemic Control  Blood sugars > goal of 180 mg/dL. Needs insulin adjustment.  Inpatient Diabetes Program Recommendations:   Increase Levemir to 24 units bid Increase Novolog to 0-20 units tidwc and hs. If po intake remains < 50%, change Novolog to Q4H.  Will follow.  Thank you. Lorenda Peck, RD, LDN, CDE Inpatient Diabetes Coordinator [phone]

## 2017-05-15 DIAGNOSIS — R5381 Other malaise: Secondary | ICD-10-CM

## 2017-05-15 DIAGNOSIS — E1142 Type 2 diabetes mellitus with diabetic polyneuropathy: Secondary | ICD-10-CM

## 2017-05-15 DIAGNOSIS — E785 Hyperlipidemia, unspecified: Secondary | ICD-10-CM

## 2017-05-15 LAB — CBC
HCT: 32.2 % — ABNORMAL LOW (ref 36.0–46.0)
Hemoglobin: 10.9 g/dL — ABNORMAL LOW (ref 12.0–15.0)
MCH: 31 pg (ref 26.0–34.0)
MCHC: 33.9 g/dL (ref 30.0–36.0)
MCV: 91.5 fL (ref 78.0–100.0)
Platelets: 276 10*3/uL (ref 150–400)
RBC: 3.52 MIL/uL — ABNORMAL LOW (ref 3.87–5.11)
RDW: 12.9 % (ref 11.5–15.5)
WBC: 11.1 10*3/uL — ABNORMAL HIGH (ref 4.0–10.5)

## 2017-05-15 LAB — CULTURE, BLOOD (ROUTINE X 2)
CULTURE: NO GROWTH
Culture: NO GROWTH
Special Requests: ADEQUATE
Special Requests: ADEQUATE

## 2017-05-15 LAB — GLUCOSE, CAPILLARY
GLUCOSE-CAPILLARY: 211 mg/dL — AB (ref 65–99)
GLUCOSE-CAPILLARY: 96 mg/dL (ref 65–99)
GLUCOSE-CAPILLARY: 117 mg/dL — AB (ref 65–99)
Glucose-Capillary: 169 mg/dL — ABNORMAL HIGH (ref 65–99)

## 2017-05-15 LAB — BASIC METABOLIC PANEL
Anion gap: 9 (ref 5–15)
BUN: 6 mg/dL (ref 6–20)
CHLORIDE: 109 mmol/L (ref 101–111)
CO2: 23 mmol/L (ref 22–32)
CREATININE: 0.96 mg/dL (ref 0.44–1.00)
Calcium: 8.2 mg/dL — ABNORMAL LOW (ref 8.9–10.3)
GFR calc non Af Amer: >60 mL/min (ref >60)
Glucose, Bld: 136 mg/dL — ABNORMAL HIGH (ref 65–99)
POTASSIUM: 2.8 mmol/L — AB (ref 3.5–5.1)
SODIUM: 141 mmol/L (ref 135–145)

## 2017-05-15 MED ORDER — THIAMINE HCL 100 MG PO TABS: 100.0000 mg | ORAL_TABLET | Freq: Every day | ORAL | 1 refills | Status: DC

## 2017-05-15 MED ORDER — INSULIN ASPART PROT & ASPART (70-30 MIX) 100 UNIT/ML ~~LOC~~ SUSP: 30.0000 [IU] | Freq: Two times a day (BID) | SUBCUTANEOUS | 11 refills | Status: DC

## 2017-05-15 MED ORDER — TRAMADOL HCL 50 MG PO TABS
50.0000 mg | ORAL_TABLET | Freq: Once | ORAL | Status: AC
  Administered 2017-05-15: 50 mg via ORAL
  Filled 2017-05-15: qty 1

## 2017-05-15 MED ORDER — LEVETIRACETAM 500 MG PO TABS: 500.0000 mg | ORAL_TABLET | Freq: Two times a day (BID) | ORAL | 1 refills | Status: DC

## 2017-05-15 MED ORDER — ATORVASTATIN CALCIUM 40 MG PO TABS: 40.0000 mg | ORAL_TABLET | Freq: Every day | ORAL | 1 refills | Status: DC

## 2017-05-15 MED ORDER — LABETALOL HCL 200 MG PO TABS: 200.0000 mg | ORAL_TABLET | Freq: Two times a day (BID) | ORAL | 1 refills | Status: DC

## 2017-05-15 MED ORDER — CYCLOBENZAPRINE HCL 10 MG PO TABS: 10.0000 mg | ORAL_TABLET | Freq: Every day | ORAL | 0 refills | Status: DC

## 2017-05-15 MED ORDER — GABAPENTIN 300 MG PO CAPS: 300.0000 mg | ORAL_CAPSULE | Freq: Three times a day (TID) | ORAL | 1 refills | Status: DC

## 2017-05-15 MED ORDER — GABAPENTIN 300 MG PO CAPS
300.0000 mg | ORAL_CAPSULE | Freq: Three times a day (TID) | ORAL | Status: DC
  Administered 2017-05-15 (×2): 300 mg via ORAL
  Filled 2017-05-15 (×2): qty 1

## 2017-05-15 MED ORDER — AMLODIPINE BESYLATE 10 MG PO TABS: 10.0000 mg | ORAL_TABLET | Freq: Every day | ORAL | 1 refills | Status: DC

## 2017-05-15 MED ORDER — PANTOPRAZOLE SODIUM 40 MG PO TBEC: 40.0000 mg | DELAYED_RELEASE_TABLET | Freq: Two times a day (BID) | ORAL | 1 refills | Status: DC

## 2017-05-15 MED ORDER — POTASSIUM CHLORIDE CRYS ER 20 MEQ PO TBCR
40.0000 meq | EXTENDED_RELEASE_TABLET | ORAL | Status: AC
  Administered 2017-05-15 (×3): 40 meq via ORAL
  Filled 2017-05-15 (×3): qty 2

## 2017-05-15 MED ORDER — ACETAMINOPHEN 325 MG PO TABS: 650.0000 mg | ORAL_TABLET | Freq: Four times a day (QID) | ORAL | 0 refills | Status: DC | PRN

## 2017-05-15 MED ORDER — NICOTINE 21 MG/24HR TD PT24: 21.0000 mg | MEDICATED_PATCH | TRANSDERMAL | 0 refills | Status: DC

## 2017-05-15 MED ORDER — LISINOPRIL 10 MG PO TABS: 10.0000 mg | ORAL_TABLET | Freq: Every day | ORAL | 1 refills | Status: DC

## 2017-05-15 MED ORDER — OXYBUTYNIN CHLORIDE ER 5 MG PO TB24: 5.0000 mg | ORAL_TABLET | Freq: Every day | ORAL | 0 refills | Status: DC

## 2017-05-15 MED ORDER — TRAMADOL HCL 50 MG PO TABS
50.0000 mg | ORAL_TABLET | Freq: Two times a day (BID) | ORAL | Status: DC | PRN
  Administered 2017-05-15: 50 mg via ORAL
  Filled 2017-05-15: qty 1

## 2017-05-15 MED ORDER — AMOXICILLIN-POT CLAVULANATE 875-125 MG PO TABS
1.0000 | ORAL_TABLET | Freq: Two times a day (BID) | ORAL | 0 refills | Status: AC
Start: 2017-05-15 — End: 2017-05-19

## 2017-05-15 NOTE — Progress Notes (Signed)
CSW staffed case with [place] Surveyor, quantity. CSW Surveyor, quantity advised CSW that this would be an inappropriate SNF referral. CSW signing off, RNCM following to assist patient with home health services.  Abundio Miu, [place] Social Worker [facility] Cell#: [phone]

## 2017-05-15 NOTE — Progress Notes (Signed)
Pt selected [facility] for HHRN/PT.  PT have a PCS that comes everyday.  She could not remember the name of the company.  Pt will call them when she discharge home.

## 2017-05-15 NOTE — Discharge Summary (Signed)
Physician Discharge Summary  [name] UXL:[id number] DOB: 11-01-1955 DOA: 05/10/2017  PCP: Nolene Ebbs, MD  Admit date: 05/10/2017 Discharge date: 05/15/2017  Time spent: 35 minutes  Recommendations for Outpatient Follow-up:  1. Repeat basic metabolic panel to follow electrolytes and renal function 2. Repeat chest x-ray in 4-6 weeks to assure resolution of lungs infiltrates 3. Reassess blood pressure and adjust antihypertensive regimen 4. Close follow-up of CBGs and A1C with further adjustment on his hypoglycemic regimen as needed  5. Assist patient as needed with cessation of illicit drugs and tobacco abuse.  Discharge Diagnoses:  Type II DKA Acute encephalopathy: Toxic metabolic in nature Acute kidney injury superimposed on CKD [facility]): Stage III at baseline  Aspiration pneumonia due to gastric secretions (HCC) Tobacco abuse Cocaine abuse Hypertension Alcohol abuse Hyponatremia/hyperkalemia GERD History of seizure disorder Physical deconditioning  Discharge Condition: Stable and improved.  Patient has been discharged home with home health services and instruction to follow-up with PCP in 10 days.  Diet recommendation: Heart healthy diet and modify carbohydrates.  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  Admitted secondary to acute encephalopathy, DKA, presumed aspiration pneumonia and acute on chronic renal failure.  Hospital Course:  1-DKA -Resolved after IV fluids and insulin drip -CBG's even still elevated, much improved  and stable -patient advise to follow low carb diet -will discharge on 70/30 30 units BID -will need close follow up with PCP and further adjustment on her hypoglycemic regimen. -A1C 13.2  2-acute encephalopathy: Metabolic and toxic in nature -Patient with history of alcohol and drug abuse; presenting with concerns of aspiration pneumonia and acute DKA with significant electrolytes and sugar impairment. -mentation improved and at baseline -patient AAOX3 abdominal discharge -will complete antibiotic therapy and patient was educated about medication compliance to prevent DKA in the future.  3-pneumonia: With concerns for aspiration in the presence of active vomiting (most likely from DKA process). -transition antibiotics to PO (will use augmentin to complete therapy) -continue PRN inhaler -Patient advised to quit smoking -Repeat chest x-ray in 4-6 weeks to assure resolution of infiltrates.  4-tobacco abuse, alcohol abuse and cocaine use -Cessation counseling has been provided -continue thiamine and folic acid -no withdrawal symptoms appreciated during admission  5-hypertension -Will discharge on amlodipine, low-dose lisinopril (mainly for renal protection), Lasix and the use of labetalol. -Patient advised to follow a low-sodium diet -Please reassess blood pressure at follow-up visit and adjust antihypertensive regimen as needed.  6-hyponatremia/hyperkalemia -Electrolytes derangement associated with DKA and elevated CBGs -Resolved with fluid resuscitation and controlling of CBG's. -Will recommend basic metabolic panel at follow-up visit to assess electrolytes -Advised to keep herself well-hydrated.  7-acute on chronic renal failure: Stage III in nature -Secondary to dehydration in the presence of GI losses and DKA -No significant abnormalities appreciated on CT abdomen and pelvis -Advised to keep herself well-hydrated  -Will recommend repeat basic metabolic panel at follow-up  to assess creatinine trend. -At discharge patient's creatinine was 0.96; okay to resume ACE inhibitors.  8-history of seizure/ICH and CVA -No acute abnormalities appreciated on CT scan of the head -Continue Keppra  -  No seizure activity appreciated during hospitalization.  9-chronic right lower leg wound -Actively followed by vascular surgery as an outpatient -Will follow wound care recommendations and arrange home health nurse to continue following wound after discharge. -No signs of active infection or purulent suppuration.  10-pressure injury/skin breakdown -will recommend continue using preventive measures, barrier cream and constant repositioning. -PRN tylenol ordered for pain -Patient was able to urinate after Foley catheter removed. -Continue the use of Ditropan to assist/help with incontinence and bladder spasm symptoms.   11-physical deconditioning -Patient found in need of physical rehabilitation at SNF by PT -Unfortunately given history of active drug usage patient was not accepted to any facility at the moment -We have set up home health services to assist with transition of care and rehab at home.   Procedures: See below for x-ray reports  Consultations:  None   Discharge Exam: Vitals:   05/15/17 0946 05/15/17 1218  BP: (!) 165/81 (!) 142/66  Pulse: (!) 116 84  Resp:    Temp:  99 F (37.2 C)  SpO2:  94%    General: oriented, X3, no fever, no nausea, no vomiting, no abd pain, no CP or SOB.  Cardiovascular: RRR, no rubs, no gallops, no JVD.  Respiratory: normal resp effort, good O2 sat on RA, no wheezing, no crackles.  Abdomen: soft, NT, ND, positive BS   Musculoskeletal: no cyanosis, no clubbing, tender to palpation.  Skin: Stage II injury in her bottom; most likely associated with moisture and prolong periods in same position, Has been Present since the moment of admission. Right leg with varicose wound, no superimposed infection appreciated, no  drainage; will follow recommendations from wound care service.  Discharge Instructions   Discharge Instructions    Diet - low sodium heart healthy   Complete by:  As directed    Diet Carb Modified   Complete by:  As directed    Discharge instructions   Complete by:  As directed    Keep yourself well hydrated  Arrange follow up with PCP in 10 days  Follow a heart healthy and modified carbohydrates diet  Take medications as prescribed and be compliant with them  Please stop use of alcohol and recreational drugs.     Current Discharge Medication List    START taking these medications   Details  acetaminophen (TYLENOL) 325 MG tablet Take 2 tablets (650 mg total) every 6 (six) hours as needed by mouth for mild pain, fever or headache. Qty: 40 tablet, Refills: 0    labetalol (NORMODYNE) 200 MG tablet Take 1 tablet (200 mg total) 2 (two) times daily by mouth. Qty: 60 tablet, Refills: 1    nicotine (NICODERM CQ - DOSED IN MG/24 HOURS) 21 mg/24hr patch Place 1 patch (21 mg total) daily onto the skin. Qty: 28 patch, Refills: 0    pantoprazole (PROTONIX) 40 MG tablet Take 1 tablet (40 mg total) 2 (two) times daily by mouth. Qty: 60 tablet, Refills: 1    thiamine 100 MG tablet Take 1 tablet (100 mg total) daily by mouth. Qty: 30 tablet, Refills: 1      CONTINUE these medications which have CHANGED   Details  amLODipine (NORVASC) 10 MG tablet Take 1 tablet (10 mg total) daily by mouth. Qty: 30 tablet, Refills: 1    amoxicillin-clavulanate (AUGMENTIN) 875-125 MG tablet Take 1 tablet every 12 (twelve) hours for 4 days by mouth. Qty: 8 tablet, Refills: 0    atorvastatin (LIPITOR) 40 MG tablet Take 1 tablet (  40 mg total) daily at 6 PM by mouth. Qty: 30 tablet, Refills: 1    cyclobenzaprine (FLEXERIL) 10 MG tablet Take 1 tablet (10 mg total) at bedtime by mouth. Qty: 30 tablet, Refills: 0    gabapentin (NEURONTIN) 300 MG capsule Take 1 capsule (300 mg total) 3 (three) times daily  by mouth. Qty: 90 capsule, Refills: 1    insulin aspart protamine- aspart (NOVOLOG MIX 70/30) (70-30) 100 UNIT/ML injection Inject 0.3 mLs (30 Units total) 2 (two) times daily with a meal into the skin. Qty: 10 mL, Refills: 11    levETIRAcetam (KEPPRA) 500 MG tablet Take 1 tablet (500 mg total) 2 (two) times daily by mouth. Qty: 60 tablet, Refills: 1    lisinopril (PRINIVIL,ZESTRIL) 10 MG tablet Take 1 tablet (10 mg total) daily by mouth. Qty: 30 tablet, Refills: 1    oxybutynin (DITROPAN-XL) 5 MG 24 hr tablet Take 1 tablet (5 mg total) at bedtime by mouth. Qty: 30 tablet, Refills: 0      CONTINUE these medications which have NOT CHANGED   Details  aspirin 81 MG chewable tablet Chew 1 tablet (81 mg total) by mouth daily.    folic acid (FOLVITE) 1 MG tablet Take 1 tablet (1 mg total) by mouth daily. Qty: 30 tablet, Refills: 0    Multiple Vitamin (MULTIVITAMIN WITH MINERALS) TABS tablet Take 1 tablet by mouth daily.    simvastatin (ZOCOR) 10 MG tablet Take 10 mg by mouth daily.    furosemide (LASIX) 20 MG tablet Take 20 mg by mouth daily. Refills: 2    PROAIR HFA 108 (90 Base) MCG/ACT inhaler Inhale 2 puffs into the lungs 4 (four) times daily as needed for wheezing. Refills: 0      STOP taking these medications     metoprolol tartrate (LOPRESSOR) 25 MG tablet      traMADol (ULTRAM) 50 MG tablet        No Known Allergies Follow-up Information    [facility].   Why:  HHRN/PT/SW Contact information: [place] [address] [phone]        [facility] - Dme .   Contact information: [address] [address] [phone]        [facility] [place] .   Contact information: [address] [place] [address] [phone]           The results of significant diagnostics from this hospitalization (including imaging, microbiology, ancillary and laboratory) are  listed below for reference.    Significant Diagnostic Studies: Dg Chest 2 View  Result Date: 05/10/2017 CLINICAL DATA:  Hyperglycemia with confusion. Nausea and vomiting. Cough. EXAM: CHEST  2 VIEW COMPARISON:  October 31, 2016 and January 14, 2016 FINDINGS: There is a focal area of increased opacity in the right base, felt to represent a small focus of pneumonia. There is chronic scarring in the lingula anteriorly. Lungs elsewhere clear. Heart is upper normal in size with pulmonary vascularity within normal limits. There is aortic atherosclerosis. No adenopathy. There is degenerative change in the right shoulder. IMPRESSION: Small area of presumed pneumonia in the right base. Scarring anterior lingula. Lungs elsewhere clear. Heart upper normal in size. Aortic atherosclerosis present. Aortic Atherosclerosis (ICD10-I70.0). Followup PA and lateral chest radiographs recommended in 3-4 weeks following trial of antibiotic therapy to ensure resolution and exclude underlying malignancy. Electronically Signed   By: Lowella Grip III M.D.   On:  05/10/2017 12:51   Ct Head Wo Contrast  Result Date: 05/10/2017 CLINICAL DATA:  Mental status changes.  Recent stroke. EXAM: CT HEAD WITHOUT CONTRAST TECHNIQUE: Contiguous axial images were obtained from the base of the skull through the vertex without intravenous contrast. COMPARISON:  10/31/2016.  MRI 11/02/2016. FINDINGS: Brain: There is no evidence for acute hemorrhage, hydrocephalus, mass lesion, or abnormal extra-axial fluid collection. No definite CT evidence for acute infarction. Diffuse loss of parenchymal volume is consistent with atrophy. Patchy low attenuation in the deep hemispheric and periventricular white matter is nonspecific, but likely reflects chronic microvascular ischemic demyelination. Vascular: No hyperdense vessel or unexpected calcification. Skull: No evidence for fracture. No worrisome lytic or sclerotic lesion. Sinuses/Orbits: The visualized  paranasal sinuses and mastoid air cells are clear. Visualized portions of the globes and intraorbital fat are unremarkable. Other: None. IMPRESSION: 1. No acute intracranial abnormality. 2. Atrophy with chronic small vessel white matter ischemic disease. Electronically Signed   By: Misty Stanley M.D.   On: 05/10/2017 18:09   Ct Chest Wo Contrast  Result Date: 05/10/2017 CLINICAL DATA:  61 year old female with history of diabetes presenting with several days of emesis. Concern for aspiration. EXAM: CT CHEST, ABDOMEN AND PELVIS WITHOUT CONTRAST TECHNIQUE: Multidetector CT imaging of the chest, abdomen and pelvis was performed following the standard protocol without IV contrast. COMPARISON:  Chest radiograph dated 05/10/2017 FINDINGS: Evaluation of this exam is limited in the absence of intravenous contrast. CT CHEST FINDINGS Cardiovascular: Are there is no cardiomegaly or pericardial effusion. Multi vessel coronary vascular calcification with involvement of the LAD, RCA, and left circumflex artery. There is moderate atherosclerotic calcification of the thoracic aorta. There is a mild dilatation of the main pulmonary trunk suggestive of underlying pulmonary hypertension. Evaluation of the vasculature is limited in the absence of intravenous contrast. Mediastinum/Nodes: There is no hilar or mediastinal adenopathy. There is a small hiatal hernia. The esophagus appears thickened with small amount of debris. Findings may be reactive and represent inflammatory changes secondary to recurrent vomiting. Underlying esophageal infiltrative process is less likely but not entirely excluded. No mediastinal fluid collection or hematoma. No evidence of pneumomediastinum. Lungs/Pleura: The lungs are clear. There is no pleural effusion or pneumothorax. Minimal linear atelectatic changes of the left upper lobe noted. The central airways are patent. Musculoskeletal: There is degenerative changes of the spine shoulder. No acute  osseous pathology. Focal area of apparent cortical discontinuity of the mid sternal body, likely artifactual and related to motion artifact. Correlation with clinical exam and point tenderness recommended. CT ABDOMEN PELVIS FINDINGS No intra-abdominal free air or free fluid. Hepatobiliary: No focal liver abnormality is seen. No gallstones, gallbladder wall thickening, or biliary dilatation. Pancreas: Unremarkable. No pancreatic ductal dilatation or surrounding inflammatory changes. Spleen: Normal in size without focal abnormality. Adrenals/Urinary Tract: The adrenal glands are unremarkable. There is no hydronephrosis or obstructing stone on either side. Multiple small calcific densities in the right renal hilum most consistent with renovascular calcification. The visualized ureters and urinary bladder appear unremarkable. Stomach/Bowel: There is colonic diverticulosis without active inflammatory changes. There is no evidence of bowel obstruction or active inflammation. Normal appendix. Vascular/Lymphatic: There is advanced aortoiliac atherosclerotic disease. No portal venous gas identified. There is no adenopathy. Reproductive: The uterus is grossly unremarkable. No pelvic mass. The ovaries appear unremarkable as visualized. Other: Small fat containing umbilical hernia. Musculoskeletal: There is osteopenia with degenerative changes of the spine and hips. No acute osseous pathology. IMPRESSION: 1. Mild thickened appearance of the esophagus which  may represent reactive esophagitis related to recurrent or vomiting. Clinical correlation is recommended. No other acute intrathoracic, abdominal, or pelvic pathology. Specifically there is no evidence of aspiration. 2. No hydronephrosis or nephrolithiasis. 3. Scattered colonic diverticula. No bowel obstruction or active inflammation. Normal appendix. 4.  Aortic Atherosclerosis (ICD10-I70.0). Electronically Signed   By: Anner Crete M.D.   On: 05/10/2017 18:32   Ct  Renal Stone Study  Result Date: 05/10/2017 CLINICAL DATA:  61 year old female with history of diabetes presenting with several days of emesis. Concern for aspiration. EXAM: CT CHEST, ABDOMEN AND PELVIS WITHOUT CONTRAST TECHNIQUE: Multidetector CT imaging of the chest, abdomen and pelvis was performed following the standard protocol without IV contrast. COMPARISON:  Chest radiograph dated 05/10/2017 FINDINGS: Evaluation of this exam is limited in the absence of intravenous contrast. CT CHEST FINDINGS Cardiovascular: Are there is no cardiomegaly or pericardial effusion. Multi vessel coronary vascular calcification with involvement of the LAD, RCA, and left circumflex artery. There is moderate atherosclerotic calcification of the thoracic aorta. There is a mild dilatation of the main pulmonary trunk suggestive of underlying pulmonary hypertension. Evaluation of the vasculature is limited in the absence of intravenous contrast. Mediastinum/Nodes: There is no hilar or mediastinal adenopathy. There is a small hiatal hernia. The esophagus appears thickened with small amount of debris. Findings may be reactive and represent inflammatory changes secondary to recurrent vomiting. Underlying esophageal infiltrative process is less likely but not entirely excluded. No mediastinal fluid collection or hematoma. No evidence of pneumomediastinum. Lungs/Pleura: The lungs are clear. There is no pleural effusion or pneumothorax. Minimal linear atelectatic changes of the left upper lobe noted. The central airways are patent. Musculoskeletal: There is degenerative changes of the spine shoulder. No acute osseous pathology. Focal area of apparent cortical discontinuity of the mid sternal body, likely artifactual and related to motion artifact. Correlation with clinical exam and point tenderness recommended. CT ABDOMEN PELVIS FINDINGS No intra-abdominal free air or free fluid. Hepatobiliary: No focal liver abnormality is seen. No  gallstones, gallbladder wall thickening, or biliary dilatation. Pancreas: Unremarkable. No pancreatic ductal dilatation or surrounding inflammatory changes. Spleen: Normal in size without focal abnormality. Adrenals/Urinary Tract: The adrenal glands are unremarkable. There is no hydronephrosis or obstructing stone on either side. Multiple small calcific densities in the right renal hilum most consistent with renovascular calcification. The visualized ureters and urinary bladder appear unremarkable. Stomach/Bowel: There is colonic diverticulosis without active inflammatory changes. There is no evidence of bowel obstruction or active inflammation. Normal appendix. Vascular/Lymphatic: There is advanced aortoiliac atherosclerotic disease. No portal venous gas identified. There is no adenopathy. Reproductive: The uterus is grossly unremarkable. No pelvic mass. The ovaries appear unremarkable as visualized. Other: Small fat containing umbilical hernia. Musculoskeletal: There is osteopenia with degenerative changes of the spine and hips. No acute osseous pathology. IMPRESSION: 1. Mild thickened appearance of the esophagus which may represent reactive esophagitis related to recurrent or vomiting. Clinical correlation is recommended. No other acute intrathoracic, abdominal, or pelvic pathology. Specifically there is no evidence of aspiration. 2. No hydronephrosis or nephrolithiasis. 3. Scattered colonic diverticula. No bowel obstruction or active inflammation. Normal appendix. 4.  Aortic Atherosclerosis (ICD10-I70.0). Electronically Signed   By: Anner Crete M.D.   On: 05/10/2017 18:32   Microbiology: Recent Results (from the past 240 hour(s))  Culture, blood (Routine X 2) w Reflex to ID Panel     Status: None   Collection Time: 05/10/17  2:15 PM  Result Value Ref Range Status   Specimen Description BLOOD  RIGHT ANTECUBITAL  Final   Special Requests IN PEDIATRIC BOTTLE Blood Culture adequate volume  Final    Culture   Final    NO GROWTH 5 DAYS Performed at [facility], [place] [address] [place], [place] [address]    Report Status 05/15/2017 FINAL  Final  Culture, Urine     Status: None   Collection Time: 05/10/17  5:48 PM  Result Value Ref Range Status   Specimen Description URINE, CLEAN CATCH  Final   Special Requests NONE  Final   Culture   Final    NO GROWTH Performed at [facility], [place] [address], [place], [place] [address]    Report Status 05/12/2017 FINAL  Final  Culture, blood (Routine X 2) w Reflex to ID Panel     Status: None   Collection Time: 05/10/17  7:33 PM  Result Value Ref Range Status   Specimen Description BLOOD RIGHT HAND  Final   Special Requests IN PEDIATRIC BOTTLE Blood Culture adequate volume  Final   Culture   Final    NO GROWTH 5 DAYS Performed at [facility], [place] [address], [place],  [address]    Report Status 05/15/2017 FINAL  Final  MRSA PCR Screening     Status: None   Collection Time: 05/10/17  8:54 PM  Result Value Ref Range Status   MRSA by PCR NEGATIVE NEGATIVE Final    Comment:        The GeneXpert MRSA Assay (FDA approved for NASAL specimens only), is one component of a comprehensive MRSA colonization surveillance program. It is not intended to diagnose MRSA infection nor to guide or monitor treatment for MRSA infections.     Labs: Basic Metabolic Panel: Recent Labs  Lab 05/10/17 1933 05/10/17 2329 05/11/17 0354 05/13/17 0403 05/15/17 0454  NA 137 143 143 137 141  K 4.0 3.5 3.4* 3.4* 2.8*  CL 96* 105 108 105 109  CO2 17* 23 25 20* 23  GLUCOSE 553* 247* 133* 338* 136*  BUN 50* 48* 44* 11 6  CREATININE 3.13* 2.60* 2.36* 1.12* 0.96  CALCIUM 9.2 8.7* 8.6* 8.1* 8.2*   Liver Function Tests: Recent Labs  Lab 05/10/17 1933  AST 19  ALT 15  ALKPHOS 70  BILITOT 1.2  PROT 9.4*  ALBUMIN 3.4*   CBC: Recent Labs  Lab 05/10/17 1244 05/11/17 0808 05/13/17 0403 05/15/17 0454  WBC 15.8* 13.1* 11.4*  11.1*  NEUTROABS 13.2*  --   --   --   HGB 12.4 12.4 12.2 10.9*  HCT 38.4 36.1 36.1 32.2*  MCV 97.7 90.3 91.9 91.5  PLT 358 311 293 276    CBG: Recent Labs  Lab 05/14/17 1121 05/14/17 1841 05/14/17 2211 05/15/17 0803 05/15/17 1207  GLUCAP 282* 221* 211* 96 169*    Signed:  Barton Dubois MD.  [facility] 05/15/2017, 4:43 PM

## 2017-05-25 ENCOUNTER — Encounter (HOSPITAL_COMMUNITY): Payer: Self-pay | Admitting: Pharmacy Technician

## 2017-05-25 ENCOUNTER — Emergency Department (HOSPITAL_COMMUNITY): Payer: Medicaid Other

## 2017-05-25 ENCOUNTER — Inpatient Hospital Stay (HOSPITAL_COMMUNITY)
Admission: EM | Admit: 2017-05-25 | Discharge: 2017-06-04 | DRG: 637 | Disposition: A | Discharge status: Home / Self Care | Payer: Medicaid Other | Attending: Internal Medicine | Admitting: Internal Medicine

## 2017-05-25 DIAGNOSIS — I618 Other nontraumatic intracerebral hemorrhage: Secondary | ICD-10-CM | POA: Diagnosis present

## 2017-05-25 DIAGNOSIS — I161 Hypertensive emergency: Secondary | ICD-10-CM | POA: Diagnosis present

## 2017-05-25 DIAGNOSIS — G8191 Hemiplegia, unspecified affecting right dominant side: Secondary | ICD-10-CM | POA: Diagnosis present

## 2017-05-25 DIAGNOSIS — Z9119 Patient's noncompliance with other medical treatment and regimen: Secondary | ICD-10-CM | POA: Diagnosis not present

## 2017-05-25 DIAGNOSIS — Z794 Long term (current) use of insulin: Secondary | ICD-10-CM

## 2017-05-25 DIAGNOSIS — Z9114 Patient's other noncompliance with medication regimen: Secondary | ICD-10-CM | POA: Diagnosis not present

## 2017-05-25 DIAGNOSIS — S81809S Unspecified open wound, unspecified lower leg, sequela: Secondary | ICD-10-CM

## 2017-05-25 DIAGNOSIS — N183 Chronic kidney disease, stage 3 unspecified: Secondary | ICD-10-CM | POA: Diagnosis present

## 2017-05-25 DIAGNOSIS — S30814A Abrasion of vagina and vulva, initial encounter: Secondary | ICD-10-CM | POA: Diagnosis present

## 2017-05-25 DIAGNOSIS — E11622 Type 2 diabetes mellitus with other skin ulcer: Secondary | ICD-10-CM | POA: Diagnosis present

## 2017-05-25 DIAGNOSIS — L97919 Non-pressure chronic ulcer of unspecified part of right lower leg with unspecified severity: Secondary | ICD-10-CM | POA: Diagnosis present

## 2017-05-25 DIAGNOSIS — I619 Nontraumatic intracerebral hemorrhage, unspecified: Secondary | ICD-10-CM | POA: Diagnosis present

## 2017-05-25 DIAGNOSIS — E1129 Type 2 diabetes mellitus with other diabetic kidney complication: Secondary | ICD-10-CM | POA: Diagnosis present

## 2017-05-25 DIAGNOSIS — K219 Gastro-esophageal reflux disease without esophagitis: Secondary | ICD-10-CM | POA: Diagnosis not present

## 2017-05-25 DIAGNOSIS — I10 Essential (primary) hypertension: Secondary | ICD-10-CM | POA: Diagnosis not present

## 2017-05-25 DIAGNOSIS — E860 Dehydration: Secondary | ICD-10-CM | POA: Diagnosis present

## 2017-05-25 DIAGNOSIS — E1122 Type 2 diabetes mellitus with diabetic chronic kidney disease: Secondary | ICD-10-CM | POA: Diagnosis present

## 2017-05-25 DIAGNOSIS — E11649 Type 2 diabetes mellitus with hypoglycemia without coma: Secondary | ICD-10-CM | POA: Diagnosis not present

## 2017-05-25 DIAGNOSIS — F1010 Alcohol abuse, uncomplicated: Secondary | ICD-10-CM | POA: Diagnosis present

## 2017-05-25 DIAGNOSIS — N179 Acute kidney failure, unspecified: Secondary | ICD-10-CM | POA: Diagnosis present

## 2017-05-25 DIAGNOSIS — K118 Other diseases of salivary glands: Secondary | ICD-10-CM | POA: Diagnosis present

## 2017-05-25 DIAGNOSIS — F1910 Other psychoactive substance abuse, uncomplicated: Secondary | ICD-10-CM | POA: Diagnosis not present

## 2017-05-25 DIAGNOSIS — R079 Chest pain, unspecified: Secondary | ICD-10-CM

## 2017-05-25 DIAGNOSIS — Z8619 Personal history of other infectious and parasitic diseases: Secondary | ICD-10-CM

## 2017-05-25 DIAGNOSIS — F17200 Nicotine dependence, unspecified, uncomplicated: Secondary | ICD-10-CM | POA: Diagnosis not present

## 2017-05-25 DIAGNOSIS — G40909 Epilepsy, unspecified, not intractable, without status epilepticus: Secondary | ICD-10-CM | POA: Diagnosis present

## 2017-05-25 DIAGNOSIS — L97209 Non-pressure chronic ulcer of unspecified calf with unspecified severity: Secondary | ICD-10-CM | POA: Diagnosis present

## 2017-05-25 DIAGNOSIS — N170 Acute kidney failure with tubular necrosis: Secondary | ICD-10-CM | POA: Diagnosis not present

## 2017-05-25 DIAGNOSIS — D72829 Elevated white blood cell count, unspecified: Secondary | ICD-10-CM | POA: Diagnosis present

## 2017-05-25 DIAGNOSIS — I5032 Chronic diastolic (congestive) heart failure: Secondary | ICD-10-CM | POA: Diagnosis present

## 2017-05-25 DIAGNOSIS — S81809A Unspecified open wound, unspecified lower leg, initial encounter: Secondary | ICD-10-CM | POA: Clinically undetermined

## 2017-05-25 DIAGNOSIS — Z87828 Personal history of other (healed) physical injury and trauma: Secondary | ICD-10-CM | POA: Clinically undetermined

## 2017-05-25 DIAGNOSIS — E876 Hypokalemia: Secondary | ICD-10-CM | POA: Diagnosis not present

## 2017-05-25 DIAGNOSIS — I610 Nontraumatic intracerebral hemorrhage in hemisphere, subcortical: Secondary | ICD-10-CM | POA: Diagnosis not present

## 2017-05-25 DIAGNOSIS — F17210 Nicotine dependence, cigarettes, uncomplicated: Secondary | ICD-10-CM | POA: Diagnosis present

## 2017-05-25 DIAGNOSIS — L89159 Pressure ulcer of sacral region, unspecified stage: Secondary | ICD-10-CM | POA: Diagnosis present

## 2017-05-25 DIAGNOSIS — Z9181 History of falling: Secondary | ICD-10-CM

## 2017-05-25 DIAGNOSIS — I130 Hypertensive heart and chronic kidney disease with heart failure and stage 1 through stage 4 chronic kidney disease, or unspecified chronic kidney disease: Secondary | ICD-10-CM | POA: Diagnosis present

## 2017-05-25 DIAGNOSIS — I629 Nontraumatic intracranial hemorrhage, unspecified: Secondary | ICD-10-CM | POA: Diagnosis not present

## 2017-05-25 DIAGNOSIS — Z79899 Other long term (current) drug therapy: Secondary | ICD-10-CM

## 2017-05-25 DIAGNOSIS — E785 Hyperlipidemia, unspecified: Secondary | ICD-10-CM | POA: Diagnosis present

## 2017-05-25 DIAGNOSIS — E0810 Diabetes mellitus due to underlying condition with ketoacidosis without coma: Secondary | ICD-10-CM | POA: Diagnosis not present

## 2017-05-25 DIAGNOSIS — B3749 Other urogenital candidiasis: Secondary | ICD-10-CM | POA: Diagnosis present

## 2017-05-25 DIAGNOSIS — F1410 Cocaine abuse, uncomplicated: Secondary | ICD-10-CM | POA: Diagnosis present

## 2017-05-25 DIAGNOSIS — R6510 Systemic inflammatory response syndrome (SIRS) of non-infectious origin without acute organ dysfunction: Secondary | ICD-10-CM | POA: Diagnosis not present

## 2017-05-25 DIAGNOSIS — G9341 Metabolic encephalopathy: Secondary | ICD-10-CM | POA: Diagnosis present

## 2017-05-25 DIAGNOSIS — E1151 Type 2 diabetes mellitus with diabetic peripheral angiopathy without gangrene: Secondary | ICD-10-CM | POA: Diagnosis present

## 2017-05-25 DIAGNOSIS — E1110 Type 2 diabetes mellitus with ketoacidosis without coma: Principal | ICD-10-CM

## 2017-05-25 DIAGNOSIS — I5030 Unspecified diastolic (congestive) heart failure: Secondary | ICD-10-CM | POA: Diagnosis not present

## 2017-05-25 LAB — RAPID URINE DRUG SCREEN, HOSP PERFORMED
Amphetamines: NOT DETECTED
BENZODIAZEPINES: NOT DETECTED
Barbiturates: NOT DETECTED
Cocaine: POSITIVE — AB
Opiates: NOT DETECTED
Tetrahydrocannabinol: NOT DETECTED

## 2017-05-25 LAB — I-STAT VENOUS BLOOD GAS, ED
Acid-base deficit: 16 mmol/L — ABNORMAL HIGH (ref 0.0–2.0)
Acid-base deficit: 16 mmol/L — ABNORMAL HIGH (ref 0.0–2.0)
Bicarbonate: 10.7 mmol/L — ABNORMAL LOW (ref 20.0–28.0)
Bicarbonate: 9.9 mmol/L — ABNORMAL LOW (ref 20.0–28.0)
O2 Saturation: 58 %
O2 Saturation: 64 %
PCO2 VEN: 26.5 mmHg — AB (ref 44.0–60.0)
PCO2 VEN: 23.6 mmHg — AB (ref 44.0–60.0)
PH VEN: 7.212 — AB (ref 7.250–7.430)
PH VEN: 7.23 — AB (ref 7.250–7.430)
PO2 VEN: 38 mmHg (ref 32.0–45.0)
Patient temperature: 98.4
Patient temperature: 98.4
TCO2: 11 mmol/L — ABNORMAL LOW (ref 22–32)
TCO2: 11 mmol/L — ABNORMAL LOW (ref 22–32)
pO2, Ven: 35 mmHg (ref 32.0–45.0)

## 2017-05-25 LAB — I-STAT CG4 LACTIC ACID, ED
LACTIC ACID, VENOUS: 2.7 mmol/L — AB (ref 0.5–1.9)
Lactic Acid, Venous: 1.96 mmol/L — ABNORMAL HIGH (ref 0.5–1.9)

## 2017-05-25 LAB — URINALYSIS, ROUTINE W REFLEX MICROSCOPIC
Glucose, UA: >=500 mg/dL — AB
Leukocytes, UA: NEGATIVE
Nitrite: NEGATIVE
PROTEIN: 100 mg/dL — AB
Specific Gravity, Urine: 1.02 (ref 1.005–1.030)
pH: 6 (ref 5.0–8.0)

## 2017-05-25 LAB — CBC WITH DIFFERENTIAL/PLATELET
Basophils Absolute: 0 10*3/uL (ref 0.0–0.1)
Basophils Relative: 0 %
EOS ABS: 0 10*3/uL (ref 0.0–0.7)
EOS PCT: 0 %
HCT: 35.6 % — ABNORMAL LOW (ref 36.0–46.0)
HEMOGLOBIN: 11.5 g/dL — AB (ref 12.0–15.0)
LYMPHS ABS: 1.8 10*3/uL (ref 0.7–4.0)
Lymphocytes Relative: 12 %
MCH: 30.8 pg (ref 26.0–34.0)
MCHC: 32.3 g/dL (ref 30.0–36.0)
MCV: 95.4 fL (ref 78.0–100.0)
MONOS PCT: 5 %
Monocytes Absolute: 0.7 10*3/uL (ref 0.1–1.0)
Neutro Abs: 12.4 10*3/uL — ABNORMAL HIGH (ref 1.7–7.7)
Neutrophils Relative %: 83 %
PLATELETS: 309 10*3/uL (ref 150–400)
RBC: 3.73 MIL/uL — ABNORMAL LOW (ref 3.87–5.11)
RDW: 13.2 % (ref 11.5–15.5)
WBC: 15 10*3/uL — ABNORMAL HIGH (ref 4.0–10.5)

## 2017-05-25 LAB — COMPREHENSIVE METABOLIC PANEL
ALK PHOS: 58 U/L (ref 38–126)
ALT: 9 U/L — ABNORMAL LOW (ref 14–54)
ANION GAP: 27 — AB (ref 5–15)
AST: 12 U/L — ABNORMAL LOW (ref 15–41)
Albumin: 2.7 g/dL — ABNORMAL LOW (ref 3.5–5.0)
BUN: 25 mg/dL — ABNORMAL HIGH (ref 6–20)
CALCIUM: 9.6 mg/dL (ref 8.9–10.3)
CO2: 10 mmol/L — AB (ref 22–32)
Chloride: 105 mmol/L (ref 101–111)
Creatinine, Ser: 2.59 mg/dL — ABNORMAL HIGH (ref 0.44–1.00)
GFR calc non Af Amer: 19 mL/min — ABNORMAL LOW (ref >60)
GFR, EST AFRICAN AMERICAN: 22 mL/min — AB (ref >60)
Glucose, Bld: 578 mg/dL (ref 65–99)
Potassium: 4.6 mmol/L (ref 3.5–5.1)
SODIUM: 142 mmol/L (ref 135–145)
Total Bilirubin: 2 mg/dL — ABNORMAL HIGH (ref 0.3–1.2)
Total Protein: 7.9 g/dL (ref 6.5–8.1)

## 2017-05-25 LAB — URINALYSIS, MICROSCOPIC (REFLEX)

## 2017-05-25 LAB — PROTIME-INR
INR: 1.28
PROTHROMBIN TIME: 15.9 s — AB (ref 11.4–15.2)

## 2017-05-25 LAB — AMMONIA: AMMONIA: 28 umol/L (ref 9–35)

## 2017-05-25 LAB — CBG MONITORING, ED
GLUCOSE-CAPILLARY: 519 mg/dL — AB (ref 65–99)
Glucose-Capillary: 502 mg/dL (ref 65–99)

## 2017-05-25 LAB — ETHANOL

## 2017-05-25 LAB — BRAIN NATRIURETIC PEPTIDE: B NATRIURETIC PEPTIDE 5: 19.8 pg/mL (ref 0.0–100.0)

## 2017-05-25 LAB — LIPASE, BLOOD: LIPASE: 75 U/L — AB (ref 11–51)

## 2017-05-25 MED ORDER — OXYBUTYNIN CHLORIDE ER 5 MG PO TB24
5.0000 mg | ORAL_TABLET | Freq: Every day | ORAL | Status: DC
Start: 2017-05-25 — End: 2017-06-04
  Administered 2017-05-27 – 2017-06-03 (×8): 5 mg via ORAL
  Filled 2017-05-25 (×10): qty 1

## 2017-05-25 MED ORDER — ONDANSETRON HCL 4 MG/2ML IJ SOLN: 4.0000 mg | Freq: Three times a day (TID) | INTRAMUSCULAR | Status: DC | PRN

## 2017-05-25 MED ORDER — LABETALOL HCL 100 MG PO TABS: 200.0000 mg | ORAL_TABLET | Freq: Two times a day (BID) | ORAL | Status: DC

## 2017-05-25 MED ORDER — NYSTATIN 100000 UNIT/GM EX POWD
Freq: Three times a day (TID) | CUTANEOUS | Status: DC
  Administered 2017-05-25: 23:00:00 via TOPICAL
  Filled 2017-05-25: qty 15

## 2017-05-25 MED ORDER — POTASSIUM CHLORIDE 10 MEQ/100ML IV SOLN
10.0000 meq | Freq: Once | INTRAVENOUS | Status: AC
  Administered 2017-05-25: 10 meq via INTRAVENOUS
  Filled 2017-05-25: qty 100

## 2017-05-25 MED ORDER — FOLIC ACID 1 MG PO TABS
1.0000 mg | ORAL_TABLET | Freq: Every day | ORAL | Status: DC
  Administered 2017-05-26 – 2017-06-04 (×10): 1 mg via ORAL
  Filled 2017-05-25 (×10): qty 1

## 2017-05-25 MED ORDER — AMLODIPINE BESYLATE 10 MG PO TABS
10.0000 mg | ORAL_TABLET | Freq: Every day | ORAL | Status: DC
  Administered 2017-05-26 – 2017-06-04 (×10): 10 mg via ORAL
  Filled 2017-05-25 (×10): qty 1

## 2017-05-25 MED ORDER — ADULT MULTIVITAMIN W/MINERALS CH
1.0000 | ORAL_TABLET | Freq: Every day | ORAL | Status: DC
  Administered 2017-05-26 – 2017-06-04 (×10): 1 via ORAL
  Filled 2017-05-25 (×10): qty 1

## 2017-05-25 MED ORDER — ACETAMINOPHEN 325 MG PO TABS
650.0000 mg | ORAL_TABLET | Freq: Four times a day (QID) | ORAL | Status: DC | PRN
  Administered 2017-06-02 – 2017-06-04 (×3): 650 mg via ORAL
  Filled 2017-05-25 (×3): qty 2

## 2017-05-25 MED ORDER — PANTOPRAZOLE SODIUM 40 MG PO TBEC: 40.0000 mg | DELAYED_RELEASE_TABLET | Freq: Two times a day (BID) | ORAL | Status: DC

## 2017-05-25 MED ORDER — ZOLPIDEM TARTRATE 5 MG PO TABS
5.0000 mg | ORAL_TABLET | Freq: Every evening | ORAL | Status: DC | PRN
  Administered 2017-05-28 – 2017-06-02 (×4): 5 mg via ORAL
  Filled 2017-05-25 (×5): qty 1

## 2017-05-25 MED ORDER — HYDRALAZINE HCL 20 MG/ML IJ SOLN: 5.0000 mg | INTRAMUSCULAR | Status: DC | PRN

## 2017-05-25 MED ORDER — CYCLOBENZAPRINE HCL 10 MG PO TABS
10.0000 mg | ORAL_TABLET | Freq: Every day | ORAL | Status: DC
  Administered 2017-05-27 – 2017-06-03 (×8): 10 mg via ORAL
  Filled 2017-05-25 (×8): qty 1

## 2017-05-25 MED ORDER — SODIUM CHLORIDE 0.9 % IV SOLN
INTRAVENOUS | Status: AC
  Administered 2017-05-25: 5.6 [IU]/h via INTRAVENOUS
  Administered 2017-05-26: 1.6 [IU]/h via INTRAVENOUS
  Filled 2017-05-25 (×2): qty 1

## 2017-05-25 MED ORDER — LORAZEPAM 2 MG/ML IJ SOLN
0.0000 mg | Freq: Four times a day (QID) | INTRAMUSCULAR | Status: DC
Start: 2017-05-26 — End: 2017-05-27
  Administered 2017-05-26: 2 mg via INTRAVENOUS
  Filled 2017-05-25: qty 1

## 2017-05-25 MED ORDER — VITAMIN B-1 100 MG PO TABS
100.0000 mg | ORAL_TABLET | Freq: Every day | ORAL | Status: DC
  Administered 2017-05-26 – 2017-06-04 (×10): 100 mg via ORAL
  Filled 2017-05-25 (×10): qty 1

## 2017-05-25 MED ORDER — SODIUM CHLORIDE 0.9 % IV BOLUS (SEPSIS)
1000.0000 mL | Freq: Once | INTRAVENOUS | Status: AC
  Administered 2017-05-25: 1000 mL via INTRAVENOUS

## 2017-05-25 MED ORDER — SODIUM CHLORIDE 0.9 % IV BOLUS (SEPSIS)
1000.0000 mL | Freq: Once | INTRAVENOUS | Status: AC
  Administered 2017-05-26: 1000 mL via INTRAVENOUS

## 2017-05-25 MED ORDER — NICOTINE 21 MG/24HR TD PT24: 21.0000 mg | MEDICATED_PATCH | TRANSDERMAL | Status: DC

## 2017-05-25 MED ORDER — ASPIRIN 81 MG PO CHEW: 81.0000 mg | CHEWABLE_TABLET | Freq: Every day | ORAL | Status: DC

## 2017-05-25 MED ORDER — LORAZEPAM 2 MG/ML IJ SOLN: 1.0000 mg | Freq: Four times a day (QID) | INTRAMUSCULAR | Status: AC | PRN

## 2017-05-25 MED ORDER — LORAZEPAM 2 MG/ML IJ SOLN: 0.0000 mg | Freq: Two times a day (BID) | INTRAMUSCULAR | Status: DC

## 2017-05-25 MED ORDER — ALBUTEROL SULFATE (2.5 MG/3ML) 0.083% IN NEBU: 3.0000 mL | INHALATION_SOLUTION | Freq: Four times a day (QID) | RESPIRATORY_TRACT | Status: DC | PRN

## 2017-05-25 MED ORDER — LEVETIRACETAM 500 MG PO TABS
500.0000 mg | ORAL_TABLET | Freq: Two times a day (BID) | ORAL | Status: DC
  Administered 2017-05-26 – 2017-06-04 (×18): 500 mg via ORAL
  Filled 2017-05-25 (×18): qty 1

## 2017-05-25 MED ORDER — GABAPENTIN 300 MG PO CAPS
300.0000 mg | ORAL_CAPSULE | Freq: Three times a day (TID) | ORAL | Status: DC
  Administered 2017-05-26 – 2017-06-04 (×26): 300 mg via ORAL
  Filled 2017-05-25 (×26): qty 1

## 2017-05-25 MED ORDER — ENOXAPARIN SODIUM 30 MG/0.3ML ~~LOC~~ SOLN: 30.0000 mg | SUBCUTANEOUS | Status: DC

## 2017-05-25 MED ORDER — LORAZEPAM 1 MG PO TABS
1.0000 mg | ORAL_TABLET | Freq: Four times a day (QID) | ORAL | Status: AC | PRN
Start: 2017-05-25 — End: 2017-05-28

## 2017-05-25 MED ORDER — DEXTROSE-NACL 5-0.45 % IV SOLN
INTRAVENOUS | Status: DC
  Administered 2017-05-25 – 2017-05-26 (×2): via INTRAVENOUS

## 2017-05-25 MED ORDER — ATORVASTATIN CALCIUM 40 MG PO TABS: 40.0000 mg | ORAL_TABLET | Freq: Every day | ORAL | Status: DC

## 2017-05-25 NOTE — ED Notes (Signed)
Dr Sherry Ruffing given a copy of lactic acid results 2.70

## 2017-05-25 NOTE — ED Notes (Signed)
Nurse will get 2nd set of blood cultures 

## 2017-05-25 NOTE — ED Notes (Signed)
Patient transported to X-ray 

## 2017-05-25 NOTE — ED Notes (Signed)
Pt brief changed (pt had on 2 briefs completely saturated with urine) and bed linens changed. Pt with rash/breakdown noted all over perineal area extending outward and upward. Unsure of last time pt was changed. EDP made aware and assessed pt. Nystatin ordered. Will cont to monitor.

## 2017-05-25 NOTE — H&P (Signed)
History and Physical    [name] VCB:[id number] DOB: 1955-07-21 DOA: 05/25/2017  Referring MD/NP/PA:   PCP: Nolene Ebbs, MD   Patient coming from:  The patient is coming from home.  At baseline, pt is independent for most of ADL.  Chief Complaint: Generalized weakness, confusion and elevated blood sugar  HPI: [name] is a 61 y.o. female with medical history significant of poorly controlled diabetes, DKA, CKD-III, hypertension, GERD, tobacco abuse, alcohol abuse and cocaine abuse, HCV, who presents with generalized weakness, confusion and elevated blood sugar.  Per report, pt was noted by her daughter to have elevated blood sugar today. Patient has generalized weakness. She has confused. No unilateral weakness, facial droop, slurred speech. Patient recently was treated for pneumonia. She still has cough, which has not worsened per report. No active nausea, vomiting, diarrhea noted. Patient moves all extremities. Not sure if patient has any chest pain. She does not have for respiratory distress.  ED Course: pt was found to have DKA with bicarbonate 10, blood sugar 578, AG 27, WBC 15.0, lactic acid 1.96-->2.70, negative urine analysis, negative chest x-ray, worsening renal function, UDS positive for cocaine,  Tylenol level less than 10, ammonia 28,  temperature normal, elevated blood pressure 202/97-->184/102, tachycardia, tachypnea, oxygen saturation 100% on room air. Patient is admitted to stepdown as inpatient.  Review of Systems: Could not be reviewed accurately due to altered mental status.  Allergy: No Known Allergies  Past Medical History:  Diagnosis Date  . Cocaine abuse, continuous ([place])   . Diabetes mellitus    Hb A1C = 12.6 on 05/15/11, managed on Novolog 70/30, 35 U qam, 25 U qpm  . Hypertension    poorly controlled  . Insomnia disorder   . Shortness of breath     Past Surgical History:  Procedure Laterality Date  . ANKLE FRACTURE SURGERY  2007     Social History:  reports that she has been smoking cigarettes.  She has a 37.50 pack-year smoking history. she has never used smokeless tobacco. She reports that she drinks about 4.8 oz of alcohol per week. She reports that she uses drugs. Drug: Cocaine.  Family History:  Family History  Problem Relation Age of Onset  . Stroke Father   . Diabetes Sister   . Diabetes Brother      Prior to Admission medications   Medication Sig Start Date End Date Taking? Authorizing Provider  atorvastatin (LIPITOR) 40 MG tablet Take 1 tablet (40 mg total) daily at 6 PM by mouth. 05/15/17  Yes Barton Dubois, MD  cyclobenzaprine (FLEXERIL) 10 MG tablet Take 1 tablet (10 mg total) at bedtime by mouth. 05/15/17  Yes Barton Dubois, MD  gabapentin (NEURONTIN) 300 MG capsule Take 1 capsule (300 mg total) 3 (three) times daily by mouth. 05/15/17  Yes Barton Dubois, MD  insulin aspart protamine- aspart (NOVOLOG MIX 70/30) (70-30) 100 UNIT/ML injection Inject 0.3 mLs (30 Units total) 2 (two) times daily with a meal into the skin. 05/15/17  Yes Barton Dubois, MD  labetalol (NORMODYNE) 200 MG tablet Take 1 tablet (200 mg total) 2 (two) times daily by mouth. 05/15/17  Yes Barton Dubois, MD  levETIRAcetam (KEPPRA) 500 MG tablet Take 1 tablet (500 mg total) 2 (two) times daily by mouth. 05/15/17  Yes Barton Dubois, MD  lisinopril (PRINIVIL,ZESTRIL) 10 MG tablet Take 1 tablet (10 mg total) daily by mouth. 05/15/17 07/14/17 Yes Barton Dubois, MD  nicotine (NICODERM CQ - DOSED IN MG/24 HOURS) 21 mg/24hr patch Place  1 patch (21 mg total) daily onto the skin. 05/15/17  Yes Barton Dubois, MD  oxybutynin (DITROPAN-XL) 5 MG 24 hr tablet Take 1 tablet (5 mg total) at bedtime by mouth. 05/15/17  Yes Barton Dubois, MD  pantoprazole (PROTONIX) 40 MG tablet Take 1 tablet (40 mg total) 2 (two) times daily by mouth. 05/15/17  Yes Barton Dubois, MD  PROAIR HFA 108 [phone] Base) MCG/ACT inhaler Inhale 2 puffs into the lungs 4 (four) times  daily as needed for wheezing. 10/18/16  Yes [provider]  acetaminophen (TYLENOL) 325 MG tablet Take 2 tablets (650 mg total) every 6 (six) hours as needed by mouth for mild pain, fever or headache. 05/15/17   Barton Dubois, MD  amLODipine (NORVASC) 10 MG tablet Take 1 tablet (10 mg total) daily by mouth. Patient not taking: Reported on 05/25/2017 05/15/17   Barton Dubois, MD  aspirin 81 MG chewable tablet Chew 1 tablet (81 mg total) by mouth daily. 11/07/16   Rosita Fire, MD  folic acid (FOLVITE) 1 MG tablet Take 1 tablet (1 mg total) by mouth daily. Patient not taking: Reported on 05/25/2017 11/10/16   Love, Ivan Anchors, PA-C  Multiple Vitamin (MULTIVITAMIN WITH MINERALS) TABS tablet Take 1 tablet by mouth daily. 06/20/16   Angiulli, Lavon Paganini, PA-C  thiamine 100 MG tablet Take 1 tablet (100 mg total) daily by mouth. Patient not taking: Reported on 05/25/2017 05/16/17   Barton Dubois, MD    Physical Exam: Vitals:   05/26/17 0545 05/26/17 0600 05/26/17 0600 05/26/17 0615  BP: (!) 162/72 129/63 (!) 162/72 (!) 164/77  Pulse: (!) 118 (!) 114 (!) 118 (!) 116  Resp:      Temp:      TempSrc:      SpO2: 93% 91%  96%  Weight:      Height:       General: Not in acute distress HEENT:       Eyes: PERRL, EOMI, no scleral icterus.       ENT: No discharge from the ears and nose, no pharynx injection, no tonsillar enlargement.        Neck: No JVD, no bruit, no mass felt. Heme: No neck lymph node enlargement. Cardiac: S1/S2, RRR, No murmurs, No gallops or rubs. Respiratory: No rales, wheezing, rhonchi or rubs. GI: Soft, nondistended, nontender, no rebound pain, no organomegaly, BS present. GU: No hematuria Ext: 1+ pitting leg edema bilaterally. 2+DP/PT pulse bilaterally. Musculoskeletal: No joint deformities, No joint redness or warmth, no limitation of ROM in spin. Skin: Rash in perineal area Neuro: confused, not oriented X3, cranial nerves II-XII grossly intact, moves all  extremities Psych: Patient is not psychotic, no suicidal or hemocidal ideation.  Labs on Admission: I have personally reviewed following labs and imaging studies  CBC: Recent Labs  Lab 05/25/17 1955 05/26/17 0339  WBC 15.0* 14.5*  NEUTROABS 12.4*  --   HGB 11.5* 12.3  HCT 35.6* 37.3  MCV 95.4 93.3  PLT 309 259   Basic Metabolic Panel: Recent Labs  Lab 05/25/17 1955 05/26/17 0339  NA 142 149*  K 4.6 4.0  CL 105 118*  CO2 10* 12*  GLUCOSE 578* 226*  BUN 25* 24*  CREATININE 2.59* 2.11*  CALCIUM 9.6 9.2   GFR: Estimated Creatinine Clearance: 34.9 mL/min (A) (by C-G formula based on SCr of 2.11 mg/dL (H)). Liver Function Tests: Recent Labs  Lab 05/25/17 1955  AST 12*  ALT 9*  ALKPHOS 58  BILITOT 2.0*  PROT  7.9  ALBUMIN 2.7*   Recent Labs  Lab 05/25/17 1955  LIPASE 75*   Recent Labs  Lab 05/25/17 1955  AMMONIA 28   Coagulation Profile: Recent Labs  Lab 05/25/17 1955  INR 1.28   Cardiac Enzymes: No results for input(s): CKTOTAL, CKMB, CKMBINDEX, TROPONINI in the last 168 hours. BNP (last 3 results) No results for input(s): PROBNP in the last 8760 hours. HbA1C: No results for input(s): HGBA1C in the last 72 hours. CBG: Recent Labs  Lab 05/26/17 0113 05/26/17 0220 05/26/17 0335 05/26/17 0455 05/26/17 0608  GLUCAP 346* 199* 221* 209* 166*   Lipid Profile: No results for input(s): CHOL, HDL, LDLCALC, TRIG, CHOLHDL, LDLDIRECT in the last 72 hours. Thyroid Function Tests: No results for input(s): TSH, T4TOTAL, FREET4, T3FREE, THYROIDAB in the last 72 hours. Anemia Panel: No results for input(s): VITAMINB12, FOLATE, FERRITIN, TIBC, IRON, RETICCTPCT in the last 72 hours. Urine analysis:    Component Value Date/Time   COLORURINE YELLOW 05/25/2017 2135   APPEARANCEUR CLEAR 05/25/2017 2135   LABSPEC 1.020 05/25/2017 2135   PHURINE 6.0 05/25/2017 2135   GLUCOSEU >=500 (A) 05/25/2017 2135   GLUCOSEU > 1000 mg/dL (A) 11/01/2007 2056   HGBUR SMALL  (A) 05/25/2017 2135   HGBUR negative 07/15/2007 0954   BILIRUBINUR SMALL (A) 05/25/2017 2135   KETONESUR >80 (A) 05/25/2017 2135   PROTEINUR 100 (A) 05/25/2017 2135   UROBILINOGEN 1.0 04/09/2012 1524   NITRITE NEGATIVE 05/25/2017 2135   LEUKOCYTESUR NEGATIVE 05/25/2017 2135   Sepsis Labs: @LABRCNTIP (procalcitonin:4,lacticidven:4) )No results found for this or any previous visit (from the past 240 hour(s)).   Radiological Exams on Admission: Dg Chest 2 View  Result Date: 05/25/2017 CLINICAL DATA:  Tachycardia and altered mental status EXAM: CHEST  2 VIEW COMPARISON:  05/10/2017 FINDINGS: Cardiac shadow is mildly enlarged but stable. Aortic calcifications are again seen. The lungs are clear bilaterally. Degenerative changes of the right shoulder joint noted. IMPRESSION: No acute abnormality noted. Electronically Signed   By: Inez Catalina M.D.   On: 05/25/2017 20:52   Ct Head Wo Contrast  Result Date: 05/26/2017 CLINICAL DATA:  Altered mental status EXAM: CT HEAD WITHOUT CONTRAST TECHNIQUE: Contiguous axial images were obtained from the base of the skull through the vertex without intravenous contrast. COMPARISON:  05/10/2017 FINDINGS: Brain: There is an area of acute hemorrhage within the left thalamus measuring 1.7 x 1.3 cm, new since prior study. Mild atrophy and chronic small vessel disease changes. 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 No hydrocephalus. Vascular: No hyperdense vessel or unexpected calcification. Skull: No acute calvarial abnormality. Sinuses/Orbits: Visualized paranasal sinuses and mastoids clear. Orbital soft tissues unremarkable. Other: None IMPRESSION: Area of acute hemorrhage within the left thalamus measures 1.7 x 1.3 cm. Atrophy, chronic small vessel disease. Critical Value/emergent results were called by telephone at the time of interpretation on 05/26/2017 at 7:23 am to Dr. Ivor Costa , who verbally acknowledged these results. Electronically Signed   By:  Rolm Baptise M.D.   On: 05/26/2017 07:27     EKG: Independently reviewed.  Not done in ED, will get one.   Assessment/Plan Principal Problem:   DKA (diabetic ketoacidoses) (HCC) Active Problems:   Alcohol abuse   TOBACCO ABUSE   Essential hypertension   GERD   Acute renal failure superimposed on stage 3 chronic kidney disease (HCC)   Polysubstance abuse (HCC)   Type II diabetes mellitus with renal manifestations (HCC)   SIRS (systemic inflammatory response syndrome) ([place])  Acute metabolic encephalopathy   DKA (diabetic ketoacidoses) ([place]): bicarbonate 10, blood sugar 578, AG 27. Pt has confusion, but not in coma. - Admit to stepdown  - 2.5L of NS bolus - start DKA protocol with BMP q4h - IVF: NS 100 cc/h; will switch to D5-1/2NS when CBG<250 - replete K as needed - Zofran prn nausea  - NPO   Type II diabetes mellitus with renal manifestations: Last A1c 13.2 on 11/218, poorly controled. Patient is taking 70/30 insulin at home -on DAK protocol now  Acute metabolic encephalopathy: Likely multifactorial etiology, including cocaine abuse, DKA, worsening renal function. -Treat underlying issues -Frequent neuro check -Follow-up CT head  Polysubstance abuse, including tobacco, cocaine, alcohol: -Nicotine patch -CIWA protocol  GERD: -Protonix  AoCKD-III: Baseline Cre is 1.0-1.3, pt's Cre is 2.59 on admission. Likely due to prerenal secondary to dehydration and continuation of ACEI - IVF as above - Check FeNa - Follow up renal function by BMP - Hold lisinopril  Essential hypertension: -switch lisinopril to amlodpin due to worsening renal function - IV hydralazine when necessary -Continue labetalol  SIRS: Patient meets criteria for SIRS with leukocytosis, tachycardia and tachypnea. Lactic acid 1.96, 2.70. No signs of the infection, urine analysis negative. chest x-rays negative.  -Blood culture 2 -will get Procalcitonin and trend lactic acid levels per sepsis  protocol. -IVF: 2.5L of NS bolus in ED, followed by 100 cc/h   Rash in perineal area: likely due to candidal infection -Nystatin  DVT ppx: SQ Lovenox Code Status: Full code Family Communication: None at bed side.    Disposition Plan:  Anticipate discharge back to previous home environment Consults called:  none Admission status:   SDU/inpation       Date of Service 05/26/2017    Ivor Costa [facility] Pager [phone]  If 7PM-7AM, please contact night-coverage [email or web address] Password [facility] 05/26/2017, 7:33 AM

## 2017-05-25 NOTE — ED Triage Notes (Signed)
Pt arrives via [facility]. EMS called by daughter to check on pt CBG. CBG hi. Pt A&OX2. Pt recently discharged from hospital. Pt BP 204/120, HR 130, RR 30, 100% RA. Pt complaining of pain to RLE. Non compliant with medications.

## 2017-05-25 NOTE — ED Provider Notes (Signed)
[facility] EMERGENCY DEPARTMENT Provider Note   CSN: [id number] Arrival date & time: 05/25/17  [place]     History   Chief Complaint Chief Complaint  Patient presents with  . Altered Mental Status    HPI [name] is a 61 y.o. female.  The history is provided by the patient and medical records. No language interpreter was used.  Hyperglycemia  Blood sugar level PTA:  >500 Severity:  Severe Onset quality:  Gradual Duration:  1 day Timing:  Constant Progression:  Waxing and waning Chronicity:  Recurrent Diabetes status:  Controlled with insulin Context: recent illness   Context: not change in medication   Relieved by:  Nothing Ineffective treatments:  None tried Associated symptoms: dehydration and fatigue   Associated symptoms: no abdominal pain, no chest pain, no confusion, no diaphoresis, no dizziness, no fever, no increased thirst, no shortness of breath and no weakness   Risk factors: hx of DKA     Past Medical History:  Diagnosis Date  . Cocaine abuse, continuous ([place])   . Diabetes mellitus    Hb A1C = 12.6 on 05/15/11, managed on Novolog 70/30, 35 U qam, 25 U qpm  . Hypertension    poorly controlled  . Insomnia disorder   . Shortness of breath     Patient Active Problem List   Diagnosis Date Noted  . Aspiration pneumonia due to gastric secretions ([place])   . Confusion 05/10/2017  . Benign essential HTN   . Chronic hepatitis C without hepatic coma ([place])   . Labile blood glucose   . Hypoglycemia associated with type 2 diabetes mellitus ([place])   . Poorly controlled type 2 diabetes mellitus with peripheral neuropathy ([place])   . Neurologic gait disorder   . Neuropathic pain   . Type 2 diabetes mellitus with peripheral neuropathy (HCC)   . History of intracranial hemorrhage   . Diarrhea   . Urinary incontinence   . Hypokalemia   . Polysubstance abuse ([place])   . 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  Disorientation   . Meningitis 11/02/2016  . Type 2 diabetes mellitus with hyperosmolar nonketotic hyperglycemia ([place]) 10/31/2016  . Seizure ([place]) 10/31/2016  . ARF (acute renal failure) ([place]) 10/31/2016  . Severe sepsis ([place]) 10/31/2016  . DKA (diabetic ketoacidoses) ([place]) 10/31/2016  . Acute kidney injury superimposed on CKD ([place])   . Right hemiparesis ([place]) 06/09/2016  . 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 Cytotoxic brain edema ([place]) 06/08/2016  . Encephalopathy acute 06/06/2016  . Chest pain, musculoskeletal 05/16/2011  . Vaginal candidiasis 05/16/2011  . Hypertension 05/16/2011  . Diabetes mellitus, type 2 ([place]) 05/16/2011  . CHEST PAIN 10/01/2009  . CELLULITIS AND ABSCESS OF LEG EXCEPT FOOT 08/31/2009  . INSOMNIA 07/13/2008  . MRSA 04/17/2008  . NECK MASS 04/17/2008  . COCAINE ABUSE 11/01/2007  . OBESITY NOS 07/14/2006  . DENTAL CARIES 07/14/2006  . DIABETES MELLITUS, TYPE II 04/26/2006  . ALCOHOL ABUSE 04/26/2006  . TOBACCO ABUSE 04/26/2006  . DEPRESSION 04/26/2006  . Essential hypertension 04/26/2006  . GERD 04/26/2006  . LOW BACK PAIN 04/26/2006    Past Surgical History:  Procedure Laterality Date  . ANKLE FRACTURE SURGERY  2007    OB History    No data available       Home Medications    Prior to Admission medications   Medication Sig Start Date End Date Taking? Authorizing Provider  acetaminophen (TYLENOL) 325 MG tablet Take 2 tablets (  650 mg total) every 6 (six) hours as needed by mouth for mild pain, fever or headache. 05/15/17   Barton Dubois, MD  amLODipine (NORVASC) 10 MG tablet Take 1 tablet (10 mg total) daily by mouth. 05/15/17   Barton Dubois, MD  aspirin 81 MG chewable tablet Chew 1 tablet (81 mg total) by mouth daily. 11/07/16   Rosita Fire, MD  atorvastatin (LIPITOR) 40 MG tablet Take 1 tablet (40 mg total) daily at 6 PM by mouth. 05/15/17   Barton Dubois, MD  cyclobenzaprine (FLEXERIL) 10 MG tablet Take 1 tablet (10 mg total) at  bedtime by mouth. 05/15/17   Barton Dubois, MD  folic acid (FOLVITE) 1 MG tablet Take 1 tablet (1 mg total) by mouth daily. 11/10/16   Love, Ivan Anchors, PA-C  furosemide (LASIX) 20 MG tablet Take 20 mg by mouth daily. 03/21/17   [provider]  gabapentin (NEURONTIN) 300 MG capsule Take 1 capsule (300 mg total) 3 (three) times daily by mouth. 05/15/17   Barton Dubois, MD  insulin aspart protamine- aspart (NOVOLOG MIX 70/30) (70-30) 100 UNIT/ML injection Inject 0.3 mLs (30 Units total) 2 (two) times daily with a meal into the skin. 05/15/17   Barton Dubois, MD  labetalol (NORMODYNE) 200 MG tablet Take 1 tablet (200 mg total) 2 (two) times daily by mouth. 05/15/17   Barton Dubois, MD  levETIRAcetam (KEPPRA) 500 MG tablet Take 1 tablet (500 mg total) 2 (two) times daily by mouth. 05/15/17   Barton Dubois, MD  lisinopril (PRINIVIL,ZESTRIL) 10 MG tablet Take 1 tablet (10 mg total) daily by mouth. 05/15/17 07/14/17  Barton Dubois, MD  Multiple Vitamin (MULTIVITAMIN WITH MINERALS) TABS tablet Take 1 tablet by mouth daily. 06/20/16   Angiulli, Lavon Paganini, PA-C  nicotine (NICODERM CQ - DOSED IN MG/24 HOURS) 21 mg/24hr patch Place 1 patch (21 mg total) daily onto the skin. 05/15/17   Barton Dubois, MD  oxybutynin (DITROPAN-XL) 5 MG 24 hr tablet Take 1 tablet (5 mg total) at bedtime by mouth. 05/15/17   Barton Dubois, MD  pantoprazole (PROTONIX) 40 MG tablet Take 1 tablet (40 mg total) 2 (two) times daily by mouth. 05/15/17   Barton Dubois, MD  PROAIR HFA 108 [phone] Base) MCG/ACT inhaler Inhale 2 puffs into the lungs 4 (four) times daily as needed for wheezing. 10/18/16   [provider]  simvastatin (ZOCOR) 10 MG tablet Take 10 mg by mouth daily.    [provider]  thiamine 100 MG tablet Take 1 tablet (100 mg total) daily by mouth. 05/16/17   Barton Dubois, MD    Family History Family History  Problem Relation Age of Onset  . Stroke Father   . Diabetes Sister   . Diabetes Brother      Social History Social History   Tobacco Use  . Smoking status: Current Every Day Smoker    Packs/day: 1.50    Years: 25.00    Pack years: 37.50    Types: Cigarettes  . Smokeless tobacco: Never Used  Substance Use Topics  . Alcohol use: Yes    Alcohol/week: 4.8 oz    Types: 2 Glasses of wine, 6 Cans of beer per week    Comment: pint of liquor a week  . Drug use: Yes    Types: Cocaine    Comment: 07/2015 last use      Allergies   Patient has no known allergies.   Review of Systems Review of Systems  Constitutional: Positive for fatigue.  Negative for chills, diaphoresis and fever.  HENT: Negative for congestion.   Respiratory: Positive for cough (dry). Negative for chest tightness, shortness of breath, wheezing and stridor.   Cardiovascular: Positive for leg swelling. Negative for chest pain and palpitations.  Gastrointestinal: Negative for abdominal pain.  Endocrine: Negative for polydipsia.  Genitourinary: Negative for flank pain.  Musculoskeletal: Negative for back pain and neck pain.  Neurological: Positive for light-headedness. Negative for dizziness, weakness and headaches.  Psychiatric/Behavioral: Negative for agitation and confusion.  All other systems reviewed and are negative.    Physical Exam Updated Vital Signs BP (!) 202/97 (BP Location: Right Arm)   Pulse (!) 121   Temp 98.4 F (36.9 C) (Oral)   Resp 15   Ht 5\' 8"  (1.727 m)   Wt 98.9 kg (218 lb)   SpO2 100%   BMI 33.15 kg/m   Physical Exam  Constitutional: She is oriented to person, place, and time. She appears well-developed and well-nourished. No distress.  HENT:  Head: Normocephalic.  Mouth/Throat: Oropharynx is clear and moist. No oropharyngeal exudate.  Eyes: Conjunctivae and EOM are normal. Pupils are equal, round, and reactive to light.  Neck: Normal range of motion. No JVD present.  Cardiovascular: Intact distal pulses. Tachycardia present.  No murmur heard. Pulmonary/Chest:  Effort normal. No stridor. No respiratory distress. She has no wheezes. She has no rales. She exhibits no tenderness.  Abdominal: Soft. She exhibits no distension. There is no guarding.  Genitourinary:  Genitourinary Comments: Gu exam performed by PA, evidence of candida infection present.    Musculoskeletal: She exhibits edema (bilateral). She exhibits no tenderness.  Neurological: She is alert and oriented to person, place, and time. No sensory deficit. She exhibits normal muscle tone.  Skin: Capillary refill takes less than 2 seconds. No rash noted. She is not diaphoretic. No erythema.  Psychiatric: She has a normal mood and affect.  Nursing note and vitals reviewed.    ED Treatments / Results  Labs (all labs ordered are listed, but only abnormal results are displayed) Labs Reviewed  PROTIME-INR - Abnormal; Notable for the following components:      Result Value   Prothrombin Time 15.9 (*)    All other components within normal limits  CBC WITH DIFFERENTIAL/PLATELET - Abnormal; Notable for the following components:   WBC 15.0 (*)    RBC 3.73 (*)    Hemoglobin 11.5 (*)    HCT 35.6 (*)    Neutro Abs 12.4 (*)    All other components within normal limits  COMPREHENSIVE METABOLIC PANEL - Abnormal; Notable for the following components:   CO2 10 (*)    Glucose, Bld 578 (*)    BUN 25 (*)    Creatinine, Ser 2.59 (*)    Albumin 2.7 (*)    AST 12 (*)    ALT 9 (*)    Total Bilirubin 2.0 (*)    GFR calc non Af Amer 19 (*)    GFR calc Af Amer 22 (*)    Anion gap 27 (*)    All other components within normal limits  LIPASE, BLOOD - Abnormal; Notable for the following components:   Lipase 75 (*)    All other components within normal limits  URINALYSIS, ROUTINE W REFLEX MICROSCOPIC - Abnormal; Notable for the following components:   Glucose, UA >=500 (*)    Hgb urine dipstick SMALL (*)    Bilirubin Urine SMALL (*)    Ketones, ur >80 (*)    Protein, ur  100 (*)    All other components  within normal limits  RAPID URINE DRUG SCREEN, HOSP PERFORMED - Abnormal; Notable for the following components:   Cocaine POSITIVE (*)    All other components within normal limits  URINALYSIS, MICROSCOPIC (REFLEX) - Abnormal; Notable for the following components:   Bacteria, UA RARE (*)    Squamous Epithelial / LPF 0-5 (*)    All other components within normal limits  CBG MONITORING, ED - Abnormal; Notable for the following components:   Glucose-Capillary 502 (*)    All other components within normal limits  I-STAT CG4 LACTIC ACID, ED - Abnormal; Notable for the following components:   Lactic Acid, Venous 1.96 (*)    All other components within normal limits  I-STAT VENOUS BLOOD GAS, ED - Abnormal; Notable for the following components:   pH, Ven 7.212 (*)    pCO2, Ven 26.5 (*)    Bicarbonate 10.7 (*)    TCO2 11 (*)    Acid-base deficit 16.0 (*)    All other components within normal limits  I-STAT VENOUS BLOOD GAS, ED - Abnormal; Notable for the following components:   pH, Ven 7.230 (*)    pCO2, Ven 23.6 (*)    Bicarbonate 9.9 (*)    TCO2 11 (*)    Acid-base deficit 16.0 (*)    All other components within normal limits  I-STAT CG4 LACTIC ACID, ED - Abnormal; Notable for the following components:   Lactic Acid, Venous 2.70 (*)    All other components within normal limits  CBG MONITORING, ED - Abnormal; Notable for the following components:   Glucose-Capillary 519 (*)    All other components within normal limits  CBG MONITORING, ED - Abnormal; Notable for the following components:   Glucose-Capillary 472 (*)    All other components within normal limits  CBG MONITORING, ED - Abnormal; Notable for the following components:   Glucose-Capillary 346 (*)    All other components within normal limits  CULTURE, BLOOD (ROUTINE X 2)  CULTURE, BLOOD (ROUTINE X 2)  URINE CULTURE  BRAIN NATRIURETIC PEPTIDE  ETHANOL  AMMONIA  BLOOD GAS, VENOUS  CREATININE, URINE, RANDOM  SODIUM, URINE,  RANDOM  BASIC METABOLIC PANEL  CBC    EKG  EKG Interpretation  Date/Time:  Friday May 25 2017 18:56:13 EST Ventricular Rate:  120 PR Interval:    QRS Duration: 86 QT Interval:  336 QTC Calculation: 475 R Axis:   52 Text Interpretation:  Sinus tachycardia Ventricular premature complex Aberrant complex Consider right atrial enlargement Borderline repolarization abnormality When compared to prior, no significant changes seen No STEMI Confirmed by Antony Blackbird [phone]) on 05/25/2017 8:10:45 PM       Radiology Dg Chest 2 View  Result Date: 05/25/2017 CLINICAL DATA:  Tachycardia and altered mental status EXAM: CHEST  2 VIEW COMPARISON:  05/10/2017 FINDINGS: Cardiac shadow is mildly enlarged but stable. Aortic calcifications are again seen. The lungs are clear bilaterally. Degenerative changes of the right shoulder joint noted. IMPRESSION: No acute abnormality noted. Electronically Signed   By: Inez Catalina M.D.   On: 05/25/2017 20:52    Procedures Procedures (including critical care time)  CRITICAL CARE Performed by: Gwenyth Allegra Alexsandria Kivett Total critical care time: 45 minutes Critical care time was exclusive of separately billable procedures and treating other patients. Critical care was necessary to treat or prevent imminent or life-threatening deterioration. Critical care was time spent personally by me on the following activities: development of treatment plan with  patient and/or surrogate as well as nursing, discussions with consultants, evaluation of patient's response to treatment, examination of patient, obtaining history from patient or surrogate, ordering and performing treatments and interventions, ordering and review of laboratory studies, ordering and review of radiographic studies, pulse oximetry and re-evaluation of patient's condition.   Medications Ordered in ED Medications  nystatin (MYCOSTATIN/NYSTOP) topical powder ( Topical Given 05/25/17 2251)  insulin  regular (NOVOLIN R,HUMULIN R) 100 Units in sodium chloride 0.9 % 100 mL (1 Units/mL) infusion (8.2 Units/hr Intravenous Rate/Dose Change 05/26/17 0010)  dextrose 5 %-0.45 % sodium chloride infusion (not administered)  sodium chloride 0.9 % bolus 1,000 mL (not administered)  hydrALAZINE (APRESOLINE) injection 5 mg (not administered)  folic acid (FOLVITE) tablet 1 mg (not administered)  amLODipine (NORVASC) tablet 10 mg (not administered)  thiamine (VITAMIN B-1) tablet 100 mg (not administered)  acetaminophen (TYLENOL) tablet 650 mg (not administered)  aspirin chewable tablet 81 mg (not administered)  atorvastatin (LIPITOR) tablet 40 mg (not administered)  cyclobenzaprine (FLEXERIL) tablet 10 mg (not administered)  gabapentin (NEURONTIN) capsule 300 mg (not administered)  labetalol (NORMODYNE) tablet 200 mg (not administered)  levETIRAcetam (KEPPRA) tablet 500 mg (not administered)  multivitamin with minerals tablet 1 tablet (not administered)  nicotine (NICODERM CQ - dosed in mg/24 hours) patch 21 mg (not administered)  oxybutynin (DITROPAN-XL) 24 hr tablet 5 mg (not administered)  pantoprazole (PROTONIX) EC tablet 40 mg (not administered)  albuterol (PROVENTIL) (2.5 MG/3ML) 0.083% nebulizer solution 3 mL (not administered)  ondansetron (ZOFRAN) injection 4 mg (not administered)  zolpidem (AMBIEN) tablet 5 mg (not administered)  enoxaparin (LOVENOX) injection 30 mg (not administered)  LORazepam (ATIVAN) tablet 1 mg (not administered)    Or  LORazepam (ATIVAN) injection 1 mg (not administered)  LORazepam (ATIVAN) injection 0-4 mg (not administered)    Followed by  LORazepam (ATIVAN) injection 0-4 mg (not administered)  sodium chloride 0.9 % bolus 1,000 mL (1,000 mLs Intravenous New Bag/Given 05/25/17 2028)  potassium chloride 10 mEq in 100 mL IVPB (10 mEq Intravenous New Bag/Given 05/25/17 2250)     Initial Impression / Assessment and Plan / ED Course  I have reviewed the triage vital  signs and the nursing notes.  Pertinent labs & imaging results that were available during my care of the patient were reviewed by me and considered in my medical decision making (see chart for details).  Clinical Course as of May 25 2146  Ludwig Clarks May 25, 2017  2118 Inguinal folds, pannus and labia are coated with a thick layer of Candida.  Beefy red satellite lesions noted on the proximal thighs and along the pannus.  No induration or increased warmth to suggest secondary infection at this time.  Nystatin ordered  [HM]    Clinical Course User Index [HM] Muthersbaugh, Jarrett Soho, PA-C    Lasya Riel is a 61 y.o. female with a past medical history significant for diabetes with recent DKA, hypertension, hepatitis, right leg infection, and recent admission for pneumonia who presents with tachycardia, hyperglycemia, and fatigue.  Patient reports that for the last few days she has been feeling very tired.  She says that she was found to have fast heart rate and hyperglycemia with an unreasonably high glucose.  Patient says that she has had no fevers or chills, has had a chronic cough since discharge, but denies any new chest pain, shortness of breath, palpitations.  She denies nausea, vomiting, abdominal pain.  She denies any urinary changes or GI symptoms.  She denies any  other complaints.  She denies any chest pain and says that her legs are always slightly swollen.  She denies any other complaints.  On exam, lungs clear.  Chest is nontender.  Abdomen nontender.  Minimal edema in the legs.  Patient is sleepy but is easily arousable and answers all questions properly.  Patient was disoriented to time and thought it was 76.  Laboratory testing performed revealing patient is back into diabetic ketoacidosis.  Insulin drip was initiated.  Fluids were given.  Patient was found to be cocaine positive however nitrites and leukocytes were negative in the urine.  Do not feel she has UTI at this time.  X-ray shows  no new pneumonia.  Hospitalist team called and patient admitted for diabetic ketoacidosis on an insulin drip.    Final Clinical Impressions(s) / ED Diagnoses   Final diagnoses:  Diabetic ketoacidosis without coma associated with type 2 diabetes mellitus (HCC)     Clinical Impression: 1. Diabetic ketoacidosis without coma associated with type 2 diabetes mellitus ([place])     Disposition: Admit to Hospitalist service    Erma Raiche, Gwenyth Allegra, MD 05/26/17 [phone]

## 2017-05-25 NOTE — ED Notes (Signed)
Dr Sherry Ruffing given a copy of venous blood gas results

## 2017-05-26 ENCOUNTER — Inpatient Hospital Stay (HOSPITAL_COMMUNITY): Payer: Medicaid Other

## 2017-05-26 DIAGNOSIS — F1010 Alcohol abuse, uncomplicated: Secondary | ICD-10-CM

## 2017-05-26 DIAGNOSIS — I629 Nontraumatic intracranial hemorrhage, unspecified: Secondary | ICD-10-CM

## 2017-05-26 DIAGNOSIS — N183 Chronic kidney disease, stage 3 (moderate): Secondary | ICD-10-CM

## 2017-05-26 DIAGNOSIS — I10 Essential (primary) hypertension: Secondary | ICD-10-CM

## 2017-05-26 DIAGNOSIS — F1910 Other psychoactive substance abuse, uncomplicated: Secondary | ICD-10-CM

## 2017-05-26 DIAGNOSIS — E1110 Type 2 diabetes mellitus with ketoacidosis without coma: Principal | ICD-10-CM

## 2017-05-26 DIAGNOSIS — G9341 Metabolic encephalopathy: Secondary | ICD-10-CM | POA: Diagnosis present

## 2017-05-26 DIAGNOSIS — N179 Acute kidney failure, unspecified: Secondary | ICD-10-CM

## 2017-05-26 DIAGNOSIS — I619 Nontraumatic intracerebral hemorrhage, unspecified: Secondary | ICD-10-CM

## 2017-05-26 LAB — LACTIC ACID, PLASMA
LACTIC ACID, VENOUS: 1.8 mmol/L (ref 0.5–1.9)
Lactic Acid, Venous: 2 mmol/L (ref 0.5–1.9)

## 2017-05-26 LAB — GLUCOSE, CAPILLARY
GLUCOSE-CAPILLARY: 114 mg/dL — AB (ref 65–99)
GLUCOSE-CAPILLARY: 134 mg/dL — AB (ref 65–99)
GLUCOSE-CAPILLARY: 134 mg/dL — AB (ref 65–99)
GLUCOSE-CAPILLARY: 137 mg/dL — AB (ref 65–99)
GLUCOSE-CAPILLARY: 166 mg/dL — AB (ref 65–99)
GLUCOSE-CAPILLARY: 168 mg/dL — AB (ref 65–99)
GLUCOSE-CAPILLARY: 175 mg/dL — AB (ref 65–99)
GLUCOSE-CAPILLARY: 217 mg/dL — AB (ref 65–99)
Glucose-Capillary: 188 mg/dL — ABNORMAL HIGH (ref 65–99)
Glucose-Capillary: 191 mg/dL — ABNORMAL HIGH (ref 65–99)
Glucose-Capillary: 159 mg/dL — ABNORMAL HIGH (ref 65–99)
Glucose-Capillary: 140 mg/dL — ABNORMAL HIGH (ref 65–99)
Glucose-Capillary: 142 mg/dL — ABNORMAL HIGH (ref 65–99)
Glucose-Capillary: 147 mg/dL — ABNORMAL HIGH (ref 65–99)
Glucose-Capillary: 142 mg/dL — ABNORMAL HIGH (ref 65–99)
Glucose-Capillary: 179 mg/dL — ABNORMAL HIGH (ref 65–99)

## 2017-05-26 LAB — CBG MONITORING, ED
GLUCOSE-CAPILLARY: 199 mg/dL — AB (ref 65–99)
GLUCOSE-CAPILLARY: 346 mg/dL — AB (ref 65–99)
GLUCOSE-CAPILLARY: 472 mg/dL — AB (ref 65–99)
Glucose-Capillary: 221 mg/dL — ABNORMAL HIGH (ref 65–99)
Glucose-Capillary: 209 mg/dL — ABNORMAL HIGH (ref 65–99)
Glucose-Capillary: 166 mg/dL — ABNORMAL HIGH (ref 65–99)
Glucose-Capillary: 123 mg/dL — ABNORMAL HIGH (ref 65–99)

## 2017-05-26 LAB — BASIC METABOLIC PANEL
ANION GAP: 6 (ref 5–15)
Anion gap: 19 — ABNORMAL HIGH (ref 5–15)
BUN: 24 mg/dL — ABNORMAL HIGH (ref 6–20)
BUN: 15 mg/dL (ref 6–20)
CALCIUM: 9.2 mg/dL (ref 8.9–10.3)
CO2: 12 mmol/L — AB (ref 22–32)
CO2: 22 mmol/L (ref 22–32)
CREATININE: 2.11 mg/dL — AB (ref 0.44–1.00)
Calcium: 8.7 mg/dL — ABNORMAL LOW (ref 8.9–10.3)
Chloride: 118 mmol/L — ABNORMAL HIGH (ref 101–111)
Chloride: 119 mmol/L — ABNORMAL HIGH (ref 101–111)
Creatinine, Ser: 1.29 mg/dL — ABNORMAL HIGH (ref 0.44–1.00)
GFR calc Af Amer: 51 mL/min — ABNORMAL LOW (ref >60)
GFR, EST AFRICAN AMERICAN: 28 mL/min — AB (ref >60)
GFR, EST NON AFRICAN AMERICAN: 24 mL/min — AB (ref >60)
GFR, EST NON AFRICAN AMERICAN: 44 mL/min — AB (ref >60)
GLUCOSE: 187 mg/dL — AB (ref 65–99)
Glucose, Bld: 226 mg/dL — ABNORMAL HIGH (ref 65–99)
POTASSIUM: 3 mmol/L — AB (ref 3.5–5.1)
Potassium: 4 mmol/L (ref 3.5–5.1)
SODIUM: 149 mmol/L — AB (ref 135–145)
Sodium: 147 mmol/L — ABNORMAL HIGH (ref 135–145)

## 2017-05-26 LAB — URINALYSIS, ROUTINE W REFLEX MICROSCOPIC
Bilirubin Urine: NEGATIVE
Glucose, UA: 150 mg/dL — AB
KETONES UR: 20 mg/dL — AB
Leukocytes, UA: NEGATIVE
NITRITE: NEGATIVE
PH: 5 (ref 5.0–8.0)
Protein, ur: 100 mg/dL — AB
SPECIFIC GRAVITY, URINE: 1.014 (ref 1.005–1.030)

## 2017-05-26 LAB — SODIUM, URINE, RANDOM: SODIUM UR: 75 mmol/L

## 2017-05-26 LAB — CBC
HCT: 37.3 % (ref 36.0–46.0)
Hemoglobin: 12.3 g/dL (ref 12.0–15.0)
MCH: 30.8 pg (ref 26.0–34.0)
MCHC: 33 g/dL (ref 30.0–36.0)
MCV: 93.3 fL (ref 78.0–100.0)
PLATELETS: 294 10*3/uL (ref 150–400)
RBC: 4 MIL/uL (ref 3.87–5.11)
RDW: 13.1 % (ref 11.5–15.5)
WBC: 14.5 10*3/uL — ABNORMAL HIGH (ref 4.0–10.5)

## 2017-05-26 LAB — PHOSPHORUS: PHOSPHORUS: 1.9 mg/dL — AB (ref 2.5–4.6)

## 2017-05-26 LAB — HEMOGLOBIN A1C
Hgb A1c MFr Bld: 14.6 % — ABNORMAL HIGH (ref 4.8–5.6)
Mean Plasma Glucose: 372.32 mg/dL

## 2017-05-26 LAB — PROCALCITONIN: Procalcitonin: <0.1 ng/mL

## 2017-05-26 LAB — MAGNESIUM: Magnesium: 2 mg/dL (ref 1.7–2.4)

## 2017-05-26 LAB — CREATININE, URINE, RANDOM: CREATININE, URINE: 33.99 mg/dL

## 2017-05-26 LAB — BETA-HYDROXYBUTYRIC ACID: BETA-HYDROXYBUTYRIC ACID: 1.87 mmol/L — AB (ref 0.05–0.27)

## 2017-05-26 LAB — MRSA PCR SCREENING: MRSA by PCR: POSITIVE — AB

## 2017-05-26 MED ORDER — STROKE: EARLY STAGES OF RECOVERY BOOK
Freq: Once | Status: AC
  Administered 2017-05-26: 14:00:00
  Filled 2017-05-26 (×2): qty 1

## 2017-05-26 MED ORDER — ORAL CARE MOUTH RINSE
15.0000 mL | Freq: Two times a day (BID) | OROMUCOSAL | Status: DC
  Administered 2017-05-26 – 2017-06-04 (×16): 15 mL via OROMUCOSAL

## 2017-05-26 MED ORDER — SODIUM GLYCEROPHOSPHATE 1 MMOLE/ML IV SOLN
20.0000 mmol | Freq: Once | INTRAVENOUS | Status: AC
  Administered 2017-05-26: 20 mmol via INTRAVENOUS
  Filled 2017-05-26: qty 20

## 2017-05-26 MED ORDER — PANTOPRAZOLE SODIUM 40 MG IV SOLR
40.0000 mg | Freq: Every day | INTRAVENOUS | Status: DC
  Administered 2017-05-26 – 2017-05-28 (×3): 40 mg via INTRAVENOUS
  Filled 2017-05-26 (×3): qty 40

## 2017-05-26 MED ORDER — SENNOSIDES-DOCUSATE SODIUM 8.6-50 MG PO TABS
1.0000 | ORAL_TABLET | Freq: Two times a day (BID) | ORAL | Status: DC
  Administered 2017-05-28 – 2017-06-04 (×14): 1 via ORAL
  Filled 2017-05-26 (×15): qty 1

## 2017-05-26 MED ORDER — MUPIROCIN 2 % EX OINT
1.0000 "application " | TOPICAL_OINTMENT | Freq: Two times a day (BID) | CUTANEOUS | Status: AC
  Administered 2017-05-26 – 2017-05-30 (×10): 1 via NASAL
  Filled 2017-05-26 (×2): qty 22

## 2017-05-26 MED ORDER — HYDRALAZINE HCL 20 MG/ML IJ SOLN
5.0000 mg | INTRAMUSCULAR | Status: DC | PRN
  Administered 2017-05-28 – 2017-05-29 (×2): 10 mg via INTRAVENOUS
  Administered 2017-05-29: 5 mg via INTRAVENOUS
  Administered 2017-05-30 – 2017-05-31 (×2): 10 mg via INTRAVENOUS
  Filled 2017-05-26 (×5): qty 1

## 2017-05-26 MED ORDER — HYDRALAZINE HCL 20 MG/ML IJ SOLN
5.0000 mg | INTRAMUSCULAR | Status: DC | PRN
  Administered 2017-05-26: 5 mg via INTRAVENOUS
  Filled 2017-05-26: qty 1

## 2017-05-26 MED ORDER — CLEVIDIPINE BUTYRATE 0.5 MG/ML IV EMUL
0.0000 mg/h | INTRAVENOUS | Status: DC
  Administered 2017-05-26 (×2): 21 mg/h via INTRAVENOUS
  Administered 2017-05-26: 16 mg/h via INTRAVENOUS
  Administered 2017-05-26: 21 mg/h via INTRAVENOUS
  Administered 2017-05-26: 1 mg/h via INTRAVENOUS
  Administered 2017-05-26: 20 mg/h via INTRAVENOUS
  Administered 2017-05-27: 13 mg/h via INTRAVENOUS
  Administered 2017-05-27: 15 mg/h via INTRAVENOUS
  Administered 2017-05-27: 10 mg/h via INTRAVENOUS
  Filled 2017-05-26 (×11): qty 50

## 2017-05-26 MED ORDER — POTASSIUM PHOSPHATES 15 MMOLE/5ML IV SOLN: 30.0000 mmol | Freq: Once | INTRAVENOUS | Status: DC

## 2017-05-26 MED ORDER — CHLORHEXIDINE GLUCONATE CLOTH 2 % EX PADS
6.0000 | MEDICATED_PAD | Freq: Every day | CUTANEOUS | Status: DC
  Administered 2017-05-27: 6 via TOPICAL

## 2017-05-26 MED ORDER — SODIUM CHLORIDE 0.9 % IV SOLN: INTRAVENOUS | Status: DC

## 2017-05-26 MED ORDER — DEXTROSE-NACL 5-0.45 % IV SOLN
INTRAVENOUS | Status: DC
  Administered 2017-05-27: 02:00:00 via INTRAVENOUS

## 2017-05-26 MED ORDER — SODIUM CHLORIDE 0.9 % IV BOLUS (SEPSIS)
500.0000 mL | Freq: Once | INTRAVENOUS | Status: AC
  Administered 2017-05-26: 500 mL via INTRAVENOUS

## 2017-05-26 MED ORDER — NYSTATIN 100000 UNIT/GM EX CREA
TOPICAL_CREAM | Freq: Three times a day (TID) | CUTANEOUS | Status: DC
  Administered 2017-05-26 (×2): 1 via TOPICAL
  Administered 2017-05-26 – 2017-05-27 (×3): via TOPICAL
  Administered 2017-05-27: 1 via TOPICAL
  Administered 2017-05-28: 11:00:00 via TOPICAL
  Filled 2017-05-26: qty 15

## 2017-05-26 NOTE — ED Notes (Signed)
Glucose of 472 entered in error.  Unable to change.  Recheck due now.

## 2017-05-26 NOTE — Progress Notes (Signed)
1000 medications were given at 1240 with speech therapy. Pt was able to swallow medications crushed in applesauce, but began throat clearing shortly after swallowing. SLP decide to keep the pt NPO at this time and reassess tomorrow. Lung sounds CTA bilaterally. O2 saturation 98%.

## 2017-05-26 NOTE — Consult Note (Signed)
Neurology Consultation Reason for Consult: [place] Referring Physician: Hammonds, K  CC: Confusion  History is obtained from:patient  HPI: [name] is a 61 y.o. female with with a history of hypertension, cocaine abuse, diabetes who presented with at least a 1 day history of altered mental status.  She came into the ER yesterday at 6 PM, and was found to be in DKA, AK I, leukocytosis, and was felt that these issues were contributing to a mild encephalopathy.  As part of the workup for the encephalopathy a routine CT was ordered which demonstrates a small thalamic hemorrhage.  Neurology was consulted for management of the thalamic hemorrhage.  History is limited as the patient is slow to respond, does not volunteer much information though she is able to answer simple questions.  LKW: Unclear tpa given?: no, ICH ICH Score: 1    ROS: Unable to obtain due to altered mental status.   Past Medical History:  Diagnosis Date  . Cocaine abuse, continuous ([place])   . Diabetes mellitus    Hb A1C = 12.6 on 05/15/11, managed on Novolog 70/30, 35 U qam, 25 U qpm  . Hypertension    poorly controlled  . Insomnia disorder   . Shortness of breath      Family History  Problem Relation Age of Onset  . Stroke Father   . Diabetes Sister   . Diabetes Brother      Social History:  reports that she has been smoking cigarettes.  She has a 37.50 pack-year smoking history. she has never used smokeless tobacco. She reports that she drinks about 4.8 oz of alcohol per week. She reports that she uses drugs. Drug: Cocaine.   Exam: Current vital signs: BP (!) 183/96 (BP Location: Right Arm)   Pulse (!) 118   Temp 98.4 F (36.9 C) (Oral)   Resp 16   Ht 5\' 8"  (1.727 m)   Wt 98.9 kg (218 lb)   SpO2 98%   BMI 33.15 kg/m  Vital signs in last 24 hours: Temp:  [98.4 F (36.9 C)] 98.4 F (36.9 C) (11/16 1852) Pulse Rate:  [114-126] 118 (11/17 0800) Resp:  [15-23] 16 (11/17 0800) BP:  (129-202)/(63-107) 183/96 (11/17 0800) SpO2:  [91 %-100 %] 98 % (11/17 0800) Weight:  [98.9 kg (218 lb)] 98.9 kg (218 lb) (11/16 1848)   Physical Exam  Constitutional: Appears well-developed and well-nourished.  Psych: Affect appropriate to situation Eyes: No scleral injection HENT: No OP obstrucion Head: Normocephalic.  Cardiovascular: Normal rate and regular rhythm.  Respiratory: Effort normal and breath sounds normal to anterior ascultation GI: Soft.  No distension. There is no tenderness.  Skin: Chronic appearing ?  Venous stasis changes in the right lower extremity  Neuro: Mental Status: Patient is awake, alert, she answers some simple questions and commands, she is able to identify keys and pen, but when asked to identify my ID badge she answers "cone" She is unable to repeat. Cranial Nerves: II: Visual Fields are full. Pupils are equal, round, and reactive to light.   III,IV, VI: EOMI without ptosis or diploplia.  V: She gives inconsistent answers, stating that the 2 sides are not the same but will not tell me which one is less or more. VII: Facial movement with?  Mild decreased nasolabial fold on the right VIII: hearing is intact to voice X: Uvula elevates symmetrically XI: Shoulder shrug is symmetric. XII: tongue is midline without atrophy or fasciculations.  Motor: She gives poor effort in all  extremities, but appears grossly symmetric. Sensory: She gives inconsistent answers, unclear if she has decreased sensation on one side or the other. 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  I try to contact family for ancillary history, but there was no answer.  The patient estimates that she has been this way  for at least a couple of days.  It is unclear how much of her encephalopathy is the thalamic hemorrhage versus her medical comorbidities.  Recommendations: 1) She will need transfer to ICU for cleidipine drip.  2) no antiplatelets or anticoagulants 3) blood pressure control with goal systolic [phone] 4) Frequent neuro checks 5) If symptoms worsen or there is decreased mental status, repeat stat head CT 6) PT,OT,ST 7) Appreciate CCM assistance.     Roland Rack, MD [facility] [phone]  If 7pm- 7am, please page neurology on call as listed in [place].

## 2017-05-26 NOTE — Evaluation (Signed)
Clinical/Bedside Swallow Evaluation Patient Details  Name: [name] MRN: [id number] Date of Birth: 1955/11/29  Today's Date: 05/26/2017 Time: SLP Start Time (ACUTE ONLY): 1202 SLP Stop Time (ACUTE ONLY): 1215 SLP Time Calculation (min) (ACUTE ONLY): 13 min  Past Medical History:  Past Medical History:  Diagnosis Date  . Cocaine abuse, continuous ([place])   . Diabetes mellitus    Hb A1C = 12.6 on 05/15/11, managed on Novolog 70/30, 35 U qam, 25 U qpm  . Hypertension    poorly controlled  . Insomnia disorder   . Shortness of breath    Past Surgical History:  Past Surgical History:  Procedure Laterality Date  . ANKLE FRACTURE SURGERY  2007   HPI:  [name] is a 61 y.o. female with with a history of CVA, hypertension, cocaine abuse, diabetes who presented with at least a 1 day history of altered mental status.  She came into the ER yesterday at 6 PM, and was found to be in DKA, AK I, leukocytosis, and was felt that these issues were contributing to a mild encephalopathy.  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   Assessment / Plan / Recommendation Clinical Impression  Patient presents with moderate-severe risk for aspiration in the setting of new thalamic hemorrhage, lethargy and cognitive impairment with history of chronic dysphagia s/p pontine CVA. Today her mentation is fluctuating; pt is observed to fall asleep mid-sentence, requiring usual verbal, tactile cues for sustained attention, alertness. Initially she tolerates sips of thin liquids with no overt signs of aspiration. With 3 oz water swallow challenge, pt presents with throat clearing, delayed coughing. Self-feeds pureed solids via teaspoon  with no signs of aspiration initially. RN administered medication whole in puree, which pt retained in anterior sulcus. Thin liquid wash resulted in immediate, explosive coughing, suggestive of reduced airway protection. Medications crushed in puree were tolerated initially, however pt had a delayed but prolonged coughing episode, concerning for decreased airway protection and possible esophageal component (CT 05/10/17 showed thickened appearance of esophagus, possible esophagitis). Recommend she remain NPO for now; will follow up next date at bedside for improvements, repeat instrumental (MBS) may be necessary to r/o decline in swallow function and to determine if esophageal follow-up warranted. SLP Visit Diagnosis: Dysphagia, unspecified (R13.10)    Aspiration Risk  Moderate aspiration risk;Severe aspiration risk    Diet Recommendation NPO   Medication Administration: Via alternative means    Other  Recommendations Oral Care Recommendations: Oral care QID Other Recommendations: Have oral suction available   Follow up Recommendations Other (comment)(tba)      Frequency and Duration min 2x/week  2 weeks       Prognosis Prognosis for Safe Diet Advancement: Good Barriers to Reach Goals: Cognitive deficits;Time post onset      Swallow Study   General Date of Onset: 05/25/17 HPI: [name] is a 61 y.o. female with with a history of CVA, hypertension, cocaine abuse, diabetes who presented with at least a 1 day history of altered mental status.  She came into the ER yesterday at 6 PM, and was found to be in DKA, AK I, leukocytosis, and was felt that these issues were contributing to a mild encephalopathy.  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 Type of Study: Bedside Swallow Evaluation Previous Swallow Assessment: MBS studies after pontine CVA and 10/2016 when admited with DKA - penetration thin, strong swallow, dys3/thin Diet Prior to this Study: NPO Temperature Spikes Noted: No Respiratory Status: Room air History of Recent Intubation: No Behavior/Cognition: Lethargic/Drowsy;Cooperative;Impulsive;Distractible;Requires cueing Oral Cavity Assessment: Other (comment)(tongue coated (white)) Oral Care Completed by SLP: Recent completion by staff Oral Cavity - Dentition: Edentulous Vision: Functional for self-feeding Self-Feeding Abilities: Needs assist Patient Positioning: Upright in bed Baseline Vocal Quality: Normal Volitional Cough: Cognitively unable to elicit Volitional Swallow: Unable to elicit    Oral/Motor/Sensory Function Overall Oral Motor/Sensory Function: Other (comment)(limited assessment)   Ice Chips Ice chips: Within functional limits   Thin Liquid Thin Liquid: Impaired Presentation: Cup;Straw Pharyngeal  Phase Impairments: Suspected delayed Swallow;Throat Clearing - Immediate    Nectar Thick Nectar Thick Liquid: Not tested   Honey Thick Honey Thick Liquid: Not tested   Puree Puree: Impaired Presentation: Self Fed;Spoon Oral Phase Impairments: Poor awareness of bolus Oral Phase Functional Implications: Prolonged oral transit Pharyngeal Phase Impairments: Cough - Delayed;Change in Vital Signs   Solid   GO   Solid: Not tested       Deneise Lever, [place], CCC-SLP Speech-Language Pathologist [place] 05/26/2017,2:05 PM

## 2017-05-26 NOTE — Progress Notes (Signed)
Spoke to Dr.Hammonds regarding inability of lab, RN or IV Team to get blood for labs. Elink was notified by day shift RN at [employer].

## 2017-05-26 NOTE — H&P (Signed)
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 Patient brought to the ER where she was found to be in DKA and was started on an insulin gtt. She remained somnolent though despite blood glucose improving. Head CT performed showing acute thalamic hemorrhage. Patient also found to have HTN emergency with BP 202/97. Neurology was consulted and recommended ICU admission and initiating Cleviprex gtt for BP control.   At time of my exam patient is lying in bed, moving all extremities, obeying commands and nodding/shaking head to questions but is nonverbal which is presumed not her baseline. No family present at time of my exam. Reportedly she lives at home with her debilitated [name] and has a history of frequent falls. On exam she has skin excoriations and ulcerations in perineal area and under pannus as well as on sacrum. She also has an unstageable ulcer on lower leg.   PAST MEDICAL HISTORY :  She  has a past medical history of Cocaine abuse, continuous ([place]), Diabetes mellitus, Hypertension, Insomnia disorder, and Shortness of breath.  PAST SURGICAL HISTORY: She  has a past surgical history that includes Ankle fracture surgery (2007).  No Known Allergies  No current facility-administered medications on file prior to encounter.    Current Outpatient Medications on File Prior to Encounter  Medication Sig  . atorvastatin (LIPITOR) 40 MG tablet Take 1 tablet  (40 mg total) daily at 6 PM by mouth.  . cyclobenzaprine (FLEXERIL) 10 MG tablet Take 1 tablet (10 mg total) at bedtime by mouth.  . gabapentin (NEURONTIN) 300 MG capsule Take 1 capsule (300 mg total) 3 (three) times daily by mouth.  . insulin aspart protamine- aspart (NOVOLOG MIX 70/30) (70-30) 100 UNIT/ML injection Inject 0.3 mLs (30 Units total) 2 (two) times daily with a meal into the skin.  Marland Kitchen labetalol (NORMODYNE) 200 MG tablet Take 1 tablet (200 mg total) 2 (two) times daily by mouth.  . levETIRAcetam (KEPPRA) 500 MG tablet Take 1 tablet (500 mg total) 2 (two) times daily by mouth.  Marland Kitchen lisinopril (PRINIVIL,ZESTRIL) 10 MG tablet Take 1 tablet (10 mg total) daily by mouth.  . nicotine (NICODERM CQ - DOSED IN MG/24 HOURS) 21 mg/24hr patch Place 1 patch (21 mg total) daily onto the skin.  Marland Kitchen oxybutynin (DITROPAN-XL) 5 MG 24 hr tablet Take 1 tablet (5 mg total) at bedtime by mouth.  . pantoprazole (PROTONIX) 40 MG tablet Take 1 tablet (40 mg total) 2 (two) times daily by mouth.  Marland Kitchen PROAIR HFA 108 (90 Base) MCG/ACT inhaler Inhale 2 puffs into the lungs 4 (four) times daily as needed for wheezing.  Marland Kitchen acetaminophen (TYLENOL) 325 MG tablet Take 2 tablets (650 mg total) every 6 (six) hours as needed by mouth for mild pain, fever or headache.  Marland Kitchen amLODipine (NORVASC) 10 MG tablet Take 1 tablet (10 mg total) daily by mouth. (Patient not taking: Reported on 05/25/2017)  . aspirin 81 MG chewable tablet Chew 1 tablet (81 mg total) by mouth daily.  Marland Kitchen  folic acid (FOLVITE) 1 MG tablet Take 1 tablet (1 mg total) by mouth daily. (Patient not taking: Reported on 05/25/2017)  . Multiple Vitamin (MULTIVITAMIN WITH MINERALS) TABS tablet Take 1 tablet by mouth daily.  Marland Kitchen thiamine 100 MG tablet Take 1 tablet (100 mg total) daily by mouth. (Patient not taking: Reported on 05/25/2017)   FAMILY HISTORY:  Her indicated that her mother is deceased. She indicated that her father is deceased. She indicated that the status of her  sister is unknown. She indicated that the status of her brother is unknown.  SOCIAL HISTORY: She  reports that she has been smoking cigarettes.  She has a 37.50 pack-year smoking history. she has never used smokeless tobacco. She reports that she drinks about 4.8 oz of alcohol per week. She reports that she uses drugs. Drug: Cocaine.  REVIEW OF SYSTEMS:   Review of Systems  Unable to perform ROS: Mental status change   VITAL SIGNS: BP 129/64   Pulse (!) 108   Temp 98.1 F (36.7 C) (Oral)   Resp 20   Ht 5\' 8"  (1.727 m)   Wt 86.7 kg (191 lb 2.2 oz)   SpO2 94%   BMI 29.06 kg/m   INTAKE / OUTPUT: I/O last 3 completed shifts: In: 2600 [IV Piggyback:2600] Out: -   PHYSICAL EXAMINATION: General: Chronically ill appearing elderly woman, appears older than stated age Neuro: Awake, alert, shaking/nodding head to answer questions but is completely nonverbal, wiggles toes to command b/l, 5/5 strength BUE's, PERRL HEENT: OP clear, MM dry Cardiovascular: Tachycardic with a regular rhythm, no m/r/g Lungs: CTA b/l Abdomen: Obese, Soft NTND, excoriations with surrounding pustules on inferior aspect of pannus and in inguinal area Musculoskeletal: sacral decubitus ulcer; unstageable ulcer on left medial calf; PVD changes on BLE's Skin: as above  LABS:  BMET Recent Labs  Lab 05/25/17 1955 05/26/17 0339  NA 142 149*  K 4.6 4.0  CL 105 118*  CO2 10* 12*  BUN 25* 24*  CREATININE 2.59* 2.11*  GLUCOSE 578* 226*   Electrolytes Recent Labs  Lab 05/25/17 1955 05/26/17 0339 05/26/17 0900  CALCIUM 9.6 9.2  --   MG  --   --  2.0  PHOS  --   --  1.9*   CBC Recent Labs  Lab 05/25/17 1955 05/26/17 0339  WBC 15.0* 14.5*  HGB 11.5* 12.3  HCT 35.6* 37.3  PLT 309 294   Coag's Recent Labs  Lab 05/25/17 1955  INR 1.28   Sepsis Markers Recent Labs  Lab 05/25/17 2251 05/26/17 0539 05/26/17 0920  LATICACIDVEN 2.70* 1.8 2.0*  PROCALCITON  --  <0.10  --    ABG No results for  input(s): PHART, PCO2ART, PO2ART in the last 168 hours.  Liver Enzymes Recent Labs  Lab 05/25/17 1955  AST 12*  ALT 9*  ALKPHOS 58  BILITOT 2.0*  ALBUMIN 2.7*   Cardiac Enzymes No results for input(s): TROPONINI, PROBNP in the last 168 hours.  Glucose Recent Labs  Lab 05/26/17 0920 05/26/17 1026 05/26/17 1127 05/26/17 1224 05/26/17 1413 05/26/17 1507  GLUCAP 114* 188* 191* 217* 175* 159*   Imaging Dg Chest 2 View  Result Date: 05/25/2017 CLINICAL DATA:  Tachycardia and altered mental status EXAM: CHEST  2 VIEW COMPARISON:  05/10/2017 FINDINGS: Cardiac shadow is mildly enlarged but stable. Aortic calcifications are again seen. The lungs are clear bilaterally. Degenerative changes of the right shoulder joint noted. IMPRESSION: No acute abnormality noted. Electronically Signed   By: Elta Guadeloupe  Lukens M.D.   On: 05/25/2017 20:52   Ct Head Wo Contrast  Addendum Date: 05/26/2017   ADDENDUM REPORT: 05/26/2017 07:32 ADDENDUM: Dr. Blaine Hamper was no longer available. The results were called to Dr. Vanita Panda at 7:30 a.m. Electronically Signed   By: Rolm Baptise M.D.   On: 05/26/2017 07:32   Result Date: 05/26/2017 CLINICAL DATA:  Altered mental status EXAM: CT HEAD WITHOUT CONTRAST TECHNIQUE: Contiguous axial images were obtained from the base of the skull through the vertex without intravenous contrast. COMPARISON:  05/10/2017 FINDINGS: Brain: There is an area of acute hemorrhage within the left thalamus measuring 1.7 x 1.3 cm, new since prior study. Mild atrophy and chronic small vessel disease changes. 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 No hydrocephalus. Vascular: No hyperdense vessel or unexpected calcification. Skull: No acute calvarial abnormality. Sinuses/Orbits: Visualized paranasal sinuses and mastoids clear. Orbital soft tissues unremarkable. Other: None IMPRESSION: Area of acute hemorrhage within the left thalamus measures 1.7 x 1.3 cm. Atrophy, chronic small vessel disease.  Critical Value/emergent results were called by telephone at the time of interpretation on 05/26/2017 at 7:23 am to Dr. Ivor Costa , who verbally acknowledged these results. Electronically Signed: By: Rolm Baptise M.D. On: 05/26/2017 07:27   STUDIES:  Head CT (11/17): Area of acute hemorrhage within the left thalamus measures 1.7 x 1.3cm. Atrophy, chronic small vessel disease. 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 Patient brought to the ER where she was found to be in DKA and was started on an insulin gtt. She remained somnolent though despite blood glucose improving. Head CT performed showing acute thalamic hemorrhage. Patient also found to have HTN emergency with BP 202/97. Neurology was consulted and recommended ICU admission and initiating Cleviprex gtt for BP control.   At time of my exam patient is lying in bed, moving all extremities, obeying commands and nodding/shaking head to questions but is nonverbal which is presumed not her baseline. No family present at time of my exam. Reportedly she lives at home with her debilitated [name] and has a history of frequent falls. On exam she has skin excoriations and ulcerations in perineal area and under pannus as well as on sacrum.  She also has an unstageable ulcer on lower leg.   NEUROLOGIC 1. Acute Thalamic Hemorrhage; Hx CVA's; Hx SZ's; Cocaine abuse - acute thalamic hemorrhage, possibly due to cocaine abuse in setting of HTN emergency - appreciate Neurology consult - NPO; Speech consult - Continue her home dose of Keppra  2. Hx Etoh abuse: - monitor CIWA; give benzos PRN - start folate, thiamine, MVI  ENDOCRINE 1. DKA:  - blood glucose improving; AG still high but improving - continue insulin gtt and DKA protocol - BMP q4hrs - once AG closes will transition to her home regimen of long-acting insulin and SSI; Do question  with her history of noncompliance with insulin whether once daily Lantus would be easier for her to take then her current Novolog 70/30. - Need Endocrine clinic follow-up on discharge.   PULMONARY 1. Hx Tobacco abuse: - no nicotine patch now per Neuro  CARDIOVASCULAR 1. HTN Emergency; Hx HTN: - continue Cleviprex gtt - holding home labetalol in setting of cocaine abuse - continue norvasc 10mg  daily, Hydralazine IV PRN - SBP goal <140  2. Hx Grade 1 Diastolic Dysfunction: - caution not to fluid overload   RENAL 1. AKI: - Creatinine 2.59 on admission, up from baseline of 0.96; now is improved to 2.11 - place foley catheter; monitor UOP; avoid nephrotoxic agents  - Send UA  GASTROINTESTINAL 1. Nutrition: NPO; Speech consult 2. Hx HCV  HEMATOLOGIC No acute issues   INFECTIOUS 1. Multiple Excoriations and Ulcerations with questionable secondary bacterial infection: - excoriations on panus and perineum area with multiple small pustules in periphery; question bacterial super-infection. Nystatin cream PRN - sacral ulcer and unstageable calf ulcer.    - wound care consult.  - question if patient is unable to take care of herself and may need placement.    FAMILY  - Updates: no family at bedside at time of my exam - Inter-disciplinary family meet or Palliative Care meeting due  by: 06/02/17   60 minutes critical care time  Vernie Murders, MD  Pulmonary and [place] Pager: [phone]  05/26/2017, 3:52 PM
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
## 2017-05-26 NOTE — Evaluation (Signed)
Speech Language Pathology Evaluation Patient Details Name: [name] MRN: [id number] DOB: 1956-06-26 Today's Date: 05/26/2017 Time: 7371-0626 SLP Time Calculation (min) (ACUTE ONLY): 14 min  Problem List:  Patient Active Problem List   Diagnosis Date Noted  . Acute metabolic encephalopathy 94/85/4627  . Intracranial bleed ([place]) 05/26/2017  . ICH (intracerebral hemorrhage) ([place]) 05/26/2017  . Type II diabetes mellitus with renal manifestations ([place]) 05/25/2017  . SIRS (systemic inflammatory response syndrome) ([place]) 05/25/2017  . Aspiration pneumonia due to gastric secretions ([place])   . Confusion 05/10/2017  . Benign essential HTN   . Chronic hepatitis C without hepatic coma ([place])   . Labile blood glucose   . Hypoglycemia associated with type 2 diabetes mellitus ([place])   . Poorly controlled type 2 diabetes mellitus with peripheral neuropathy ([place])   . Neurologic gait disorder   . Neuropathic pain   . Type 2 diabetes mellitus with peripheral neuropathy ([facility])   . History of intracranial hemorrhage   . Diarrhea   . Urinary incontinence   . Hypokalemia   . Polysubstance abuse ([place])   . Right sided cerebral hemisphere cerebrovascular accident (CVA) ([place])   . Disorientation   . Meningitis 11/02/2016  . Type 2 diabetes mellitus with hyperosmolar nonketotic hyperglycemia ([place]) 10/31/2016  . Seizure ([place]) 10/31/2016  . ARF (acute renal failure) ([place]) 10/31/2016  . Severe sepsis ([place]) 10/31/2016  . DKA (diabetic ketoacidoses) ([place]) 10/31/2016  . Acute renal failure superimposed on stage 3 chronic kidney disease ([place])   . Right hemiparesis ([place]) 06/09/2016  . 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 Cytotoxic brain edema ([place]) 06/08/2016  . Encephalopathy acute 06/06/2016  . Chest pain, musculoskeletal 05/16/2011  . Vaginal candidiasis 05/16/2011  . Diabetes mellitus, type 2 ([place]) 05/16/2011  . CHEST PAIN 10/01/2009  . CELLULITIS AND ABSCESS OF LEG EXCEPT FOOT 08/31/2009  .  INSOMNIA 07/13/2008  . MRSA 04/17/2008  . NECK MASS 04/17/2008  . COCAINE ABUSE 11/01/2007  . OBESITY NOS 07/14/2006  . DENTAL CARIES 07/14/2006  . DIABETES MELLITUS, TYPE II 04/26/2006  . Alcohol abuse 04/26/2006  . TOBACCO ABUSE 04/26/2006  . DEPRESSION 04/26/2006  . Essential hypertension 04/26/2006  . GERD 04/26/2006  . LOW BACK PAIN 04/26/2006   Past Medical History:  Past Medical History:  Diagnosis Date  . Cocaine abuse, continuous ([place])   . Diabetes mellitus    Hb A1C = 12.6 on 05/15/11, managed on Novolog 70/30, 35 U qam, 25 U qpm  . Hypertension    poorly controlled  . Insomnia disorder   . Shortness of breath    Past Surgical History:  Past Surgical History:  Procedure Laterality Date  . ANKLE FRACTURE SURGERY  2007   HPI:  [name] is a 61 y.o. female with with a history of CVA, hypertension, cocaine abuse, diabetes who presented with at least a 1 day history of altered mental status.  She came into the ER yesterday at 6 PM, and was found to be in DKA, AK I, leukocytosis, and was felt that these issues were contributing to a mild encephalopathy.  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   Assessment / Plan / Recommendation Clinical Impression  Patient presents with cognitive communication  impairments which appear worsened from baseline; question role of medications (Ativan) in addition to new neurologic deficits. Sustained attention notably impaired; although pt does require cuing for arousal, she requires repetition/cuing to respond to simple biographical questions 30% of the time even when fully alert. Follows 75% of simple one-step commands, however only 50% of 2 step commands. SLP  will follow acutely for ongoing assessment and interventions to maximize cognitive-linguistic function.     SLP Assessment  SLP Recommendation/Assessment: Patient needs continued Speech Lanaguage Pathology Services SLP Visit Diagnosis: Cognitive communication deficit (R41.841)    Follow Up Recommendations  Other (comment)(tba)    Frequency and Duration min 2x/week         SLP Evaluation Cognition  Overall Cognitive Status: Impaired/Different from baseline Arousal/Alertness: Lethargic Orientation Level: Oriented to person;Oriented to place;Oriented to situation;Disoriented to time Attention: Sustained Sustained Attention: Impaired Sustained Attention Impairment: Verbal basic;Functional basic Memory: Impaired Memory Impairment: Decreased short term memory;Decreased recall of new information Decreased Short Term Memory: Verbal basic;Functional basic Awareness: Impaired Awareness Impairment: Intellectual impairment Behaviors: Impulsive Safety/Judgment: Impaired       Comprehension  Auditory Comprehension Overall Auditory Comprehension: Impaired Yes/No Questions: Within Functional Limits Commands: Impaired One Step Basic Commands: 75-100% accurate Two Step Basic Commands: 50-74% accurate Conversation: Simple Interfering Components: Attention EffectiveTechniques: Repetition Visual Recognition/Discrimination Discrimination: Not tested Reading Comprehension Reading Status: Not tested    Expression Expression Primary Mode of Expression: Verbal Verbal Expression Overall Verbal Expression: Impaired Initiation: Impaired Automatic Speech: Name(requires mod cues for name) Level of Generative/Spontaneous Verbalization: Phrase Naming: Not tested Pragmatics: Impairment Impairments: Eye contact;Dysprosody;Abnormal affect;Interpretation of nonverbal communication;Monotone Interfering Components: Attention;Premorbid deficit Effective Techniques: Open ended questions Non-Verbal  Means of Communication: Not applicable Written Expression Dominant Hand: Right Written Expression: Not tested   Oral / Motor  Oral Motor/Sensory Function Overall Oral Motor/Sensory Function: Other (comment)(limited assessment) Motor Speech Overall Motor Speech: Other (comment)(limited assessment; will assess further)   Wiggins, [place], [place] Pathologist [phone]  Aliene Altes 05/26/2017, 2:21 PM

## 2017-05-26 NOTE — ED Notes (Signed)
Requested hospital bed from [facility] 

## 2017-05-26 NOTE — Plan of Care (Signed)
Patient admitted by Dr Blaine Hamper this am.  Received call from ED regarding CT head. CT HEAD  IMPRESSION: Area of acute hemorrhage within the left thalamus measures 1.7 x 1.3 cm.  - discontinued aspirin and lovenox  -discussed with neurology, Dr Leonel Ramsay, recommended patient admission to ICU and neurology will assume care.  CCM will address the medical issues in ICU. I will sign off.    Estill Cotta M.D. [facility] 05/26/2017, 7:59 AM  Pager: [phone]

## 2017-05-27 ENCOUNTER — Other Ambulatory Visit: Payer: Self-pay

## 2017-05-27 ENCOUNTER — Encounter (HOSPITAL_COMMUNITY): Payer: Self-pay | Admitting: *Deleted

## 2017-05-27 DIAGNOSIS — I629 Nontraumatic intracranial hemorrhage, unspecified: Secondary | ICD-10-CM

## 2017-05-27 DIAGNOSIS — E0810 Diabetes mellitus due to underlying condition with ketoacidosis without coma: Secondary | ICD-10-CM

## 2017-05-27 LAB — BASIC METABOLIC PANEL
Anion gap: 9 (ref 5–15)
Anion gap: 9 (ref 5–15)
Anion gap: 4 — ABNORMAL LOW (ref 5–15)
BUN: 13 mg/dL (ref 6–20)
BUN: 13 mg/dL (ref 6–20)
BUN: 8 mg/dL (ref 6–20)
CHLORIDE: 118 mmol/L — AB (ref 101–111)
CHLORIDE: 118 mmol/L — AB (ref 101–111)
CHLORIDE: 118 mmol/L — AB (ref 101–111)
CO2: 21 mmol/L — ABNORMAL LOW (ref 22–32)
CO2: 21 mmol/L — ABNORMAL LOW (ref 22–32)
CO2: 22 mmol/L (ref 22–32)
CREATININE: 1.2 mg/dL — AB (ref 0.44–1.00)
CREATININE: 1 mg/dL (ref 0.44–1.00)
Calcium: 8.5 mg/dL — ABNORMAL LOW (ref 8.9–10.3)
Calcium: 8.5 mg/dL — ABNORMAL LOW (ref 8.9–10.3)
Calcium: 8.5 mg/dL — ABNORMAL LOW (ref 8.9–10.3)
Creatinine, Ser: 1.2 mg/dL — ABNORMAL HIGH (ref 0.44–1.00)
GFR calc Af Amer: >60 mL/min (ref >60)
GFR calc non Af Amer: 48 mL/min — ABNORMAL LOW (ref >60)
GFR calc non Af Amer: 48 mL/min — ABNORMAL LOW (ref >60)
GFR calc non Af Amer: 60 mL/min — ABNORMAL LOW (ref >60)
GFR, EST AFRICAN AMERICAN: 56 mL/min — AB (ref >60)
GFR, EST AFRICAN AMERICAN: 56 mL/min — AB (ref >60)
Glucose, Bld: 212 mg/dL — ABNORMAL HIGH (ref 65–99)
Glucose, Bld: 212 mg/dL — ABNORMAL HIGH (ref 65–99)
Glucose, Bld: 74 mg/dL (ref 65–99)
POTASSIUM: 3.2 mmol/L — AB (ref 3.5–5.1)
POTASSIUM: 3.2 mmol/L — AB (ref 3.5–5.1)
Potassium: 3 mmol/L — ABNORMAL LOW (ref 3.5–5.1)
SODIUM: 148 mmol/L — AB (ref 135–145)
SODIUM: 148 mmol/L — AB (ref 135–145)
Sodium: 144 mmol/L (ref 135–145)

## 2017-05-27 LAB — GLUCOSE, CAPILLARY
GLUCOSE-CAPILLARY: 117 mg/dL — AB (ref 65–99)
GLUCOSE-CAPILLARY: 138 mg/dL — AB (ref 65–99)
GLUCOSE-CAPILLARY: 140 mg/dL — AB (ref 65–99)
GLUCOSE-CAPILLARY: 147 mg/dL — AB (ref 65–99)
GLUCOSE-CAPILLARY: 154 mg/dL — AB (ref 65–99)
GLUCOSE-CAPILLARY: 155 mg/dL — AB (ref 65–99)
GLUCOSE-CAPILLARY: 161 mg/dL — AB (ref 65–99)
GLUCOSE-CAPILLARY: 165 mg/dL — AB (ref 65–99)
GLUCOSE-CAPILLARY: 168 mg/dL — AB (ref 65–99)
GLUCOSE-CAPILLARY: 168 mg/dL — AB (ref 65–99)
GLUCOSE-CAPILLARY: 187 mg/dL — AB (ref 65–99)
GLUCOSE-CAPILLARY: 213 mg/dL — AB (ref 65–99)
GLUCOSE-CAPILLARY: 45 mg/dL — AB (ref 65–99)
GLUCOSE-CAPILLARY: 56 mg/dL — AB (ref 65–99)
GLUCOSE-CAPILLARY: 59 mg/dL — AB (ref 65–99)
Glucose-Capillary: 199 mg/dL — ABNORMAL HIGH (ref 65–99)
Glucose-Capillary: 221 mg/dL — ABNORMAL HIGH (ref 65–99)
Glucose-Capillary: 126 mg/dL — ABNORMAL HIGH (ref 65–99)
Glucose-Capillary: 214 mg/dL — ABNORMAL HIGH (ref 65–99)
Glucose-Capillary: 69 mg/dL (ref 65–99)
Glucose-Capillary: 63 mg/dL — ABNORMAL LOW (ref 65–99)

## 2017-05-27 LAB — URINE CULTURE: Culture: NO GROWTH

## 2017-05-27 MED ORDER — POTASSIUM CHLORIDE 10 MEQ/100ML IV SOLN
10.0000 meq | INTRAVENOUS | Status: AC
  Administered 2017-05-27 (×4): 10 meq via INTRAVENOUS
  Filled 2017-05-27 (×4): qty 100

## 2017-05-27 MED ORDER — SODIUM CHLORIDE 0.9% FLUSH
10.0000 mL | INTRAVENOUS | Status: DC | PRN
  Administered 2017-05-28 – 2017-05-31 (×3): 10 mL
  Filled 2017-05-27 (×3): qty 40

## 2017-05-27 MED ORDER — FLUCONAZOLE 100 MG PO TABS
100.0000 mg | ORAL_TABLET | Freq: Every day | ORAL | Status: AC
  Administered 2017-05-27 – 2017-05-31 (×5): 100 mg via ORAL
  Filled 2017-05-27 (×5): qty 1

## 2017-05-27 MED ORDER — CIPROFLOXACIN IN D5W 400 MG/200ML IV SOLN
400.0000 mg | Freq: Two times a day (BID) | INTRAVENOUS | Status: DC
  Administered 2017-05-27 – 2017-05-28 (×4): 400 mg via INTRAVENOUS
  Filled 2017-05-27 (×6): qty 200

## 2017-05-27 MED ORDER — INSULIN ASPART PROT & ASPART (70-30 MIX) 100 UNIT/ML ~~LOC~~ SUSP
30.0000 [IU] | Freq: Two times a day (BID) | SUBCUTANEOUS | Status: DC
  Administered 2017-05-27 (×2): 30 [IU] via SUBCUTANEOUS
  Filled 2017-05-27: qty 10

## 2017-05-27 MED ORDER — CHLORHEXIDINE GLUCONATE CLOTH 2 % EX PADS
6.0000 | MEDICATED_PAD | Freq: Every day | CUTANEOUS | Status: DC
  Administered 2017-05-27 – 2017-06-03 (×8): 6 via TOPICAL

## 2017-05-27 MED ORDER — VANCOMYCIN HCL 10 G IV SOLR
1500.0000 mg | INTRAVENOUS | Status: AC
  Administered 2017-05-27 – 2017-06-01 (×6): 1500 mg via INTRAVENOUS
  Filled 2017-05-27 (×7): qty 1500

## 2017-05-27 MED ORDER — DEXTROSE 50 % IV SOLN
INTRAVENOUS | Status: AC
  Administered 2017-05-27: 50 mL
  Filled 2017-05-27: qty 50

## 2017-05-27 MED ORDER — INSULIN ASPART 100 UNIT/ML ~~LOC~~ SOLN
0.0000 [IU] | SUBCUTANEOUS | Status: DC
  Administered 2017-05-28 (×2): 8 [IU] via SUBCUTANEOUS
  Administered 2017-05-28: 15 [IU] via SUBCUTANEOUS
  Administered 2017-05-29: 8 [IU] via SUBCUTANEOUS
  Administered 2017-05-29: 3 [IU] via SUBCUTANEOUS

## 2017-05-27 MED ORDER — DEXTROSE 50 % IV SOLN
INTRAVENOUS | Status: AC
  Administered 2017-05-27: 50 mL via INTRAVENOUS
  Filled 2017-05-27: qty 50

## 2017-05-27 MED ORDER — GERHARDT'S BUTT CREAM
TOPICAL_CREAM | Freq: Four times a day (QID) | CUTANEOUS | Status: DC
  Administered 2017-05-28: 1 via TOPICAL
  Administered 2017-05-28 – 2017-06-04 (×28): via TOPICAL
  Filled 2017-05-27 (×4): qty 1

## 2017-05-27 MED ORDER — SODIUM CHLORIDE 0.9 % IV SOLN
INTRAVENOUS | Status: DC
  Administered 2017-05-27: 11:00:00 via INTRAVENOUS

## 2017-05-27 MED ORDER — DEXTROSE 50 % IV SOLN
50.0000 mL | Freq: Once | INTRAVENOUS | Status: AC
  Administered 2017-05-27: 50 mL via INTRAVENOUS

## 2017-05-27 MED ORDER — POTASSIUM CHLORIDE 10 MEQ/100ML IV SOLN
10.0000 meq | INTRAVENOUS | Status: AC
  Administered 2017-05-27 – 2017-05-28 (×6): 10 meq via INTRAVENOUS
  Filled 2017-05-27 (×6): qty 100

## 2017-05-27 NOTE — Progress Notes (Signed)
Pharmacy Antibiotic Note  [name] is a 61 y.o. female admitted on 05/25/2017 with altered mental status and hyperglycemia.  Pharmacy has been consulted for cipro and vancomycin dosing for diabetic ulcers. Pt is afebrile and WBC is elevated at 14.5. SCr is 1.2.   Plan: Vancomycin 1500mg  IV Q24H Cipro 400mg  IV Q12H F/u renal fxn, C&S, clinical status and trough at SS  Height: 5\' 8"  (172.7 cm) Weight: 191 lb 2.2 oz (86.7 kg) IBW/kg (Calculated) : 63.9  Temp (24hrs), Avg:98.5 F (36.9 C), Min:98.1 F (36.7 C), Max:99.1 F (37.3 C)  Recent Labs  Lab 05/25/17 1955 05/25/17 2014 05/25/17 2251 05/26/17 0339 05/26/17 0539 05/26/17 0920 05/26/17 2235 05/27/17 0339  WBC 15.0*  --   --  14.5*  --   --   --   --   CREATININE 2.59*  --   --  2.11*  --   --  1.29* 1.20*  1.20*  LATICACIDVEN  --  1.96* 2.70*  --  1.8 2.0*  --   --     Estimated Creatinine Clearance: 57.5 mL/min (A) (by C-G formula based on SCr of 1.2 mg/dL (H)).    No Known Allergies  Antimicrobials this admission: Vanc 11/18>> Cipro 11/18>> Flucon 11/18>>  Dose adjustments this admission: N/A  Microbiology results: Pending  Thank you for allowing pharmacy to be a part of this patient's care.  Kyrianna Barletta, Rande Lawman 05/27/2017 10:06 AM

## 2017-05-27 NOTE — Progress Notes (Signed)
  Speech Language Pathology Treatment: Dysphagia  Patient Details Name: [name] MRN: [id number] DOB: 02/22/1956 Today's Date: 05/27/2017 Time: 0950-1000 SLP Time Calculation (min) (ACUTE ONLY): 10 min  Assessment / Plan / Recommendation Clinical Impression  Pt seen for re-evaluation to assess readiness for PO. Affect less flat, responses are more timely and appropriate today, following all simple commands. Reassessed pt, challenging with multiple 3 oz water swallow challenges, pureed and regular solids. No overt signs of aspiration, however pt did present with belching, delayed coughing episode x1 with increasing volume of intake, suggestive of primary esophageal dysphagia. She may benefit from esophageal w/u as possible erosive esophagitis was seen on an imaging study during recent admission. Recommend dys 3, thin liquids, medications whole in puree with esophageal precautions.  SLP will continue follow briefly for cognitive linguistic treatment, diet tolerance and training in strategies to mitigate esophageal symptoms, including slow rate of intake, small bites/sips, upright posture during and after meals.     HPI HPI: [name] is a 61 y.o. female with with a history of CVA, hypertension, cocaine abuse, diabetes who presented with at least a 1 day history of altered mental status.  She came into the ER yesterday at 6 PM, and was found to be in DKA, AK I, leukocytosis, and was felt that these issues were contributing to a mild encephalopathy.  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      SLP Plan  Continue with current plan of care       Recommendations  Diet recommendations: Dysphagia 3 (mechanical  soft);Thin liquid Liquids provided via: Cup;Straw Medication Administration: (as tolerated) Supervision: Patient able to self feed Compensations: Slow rate;Small sips/bites Postural Changes and/or Swallow Maneuvers: Seated upright 90 degrees;Upright 30-60 min after meal                Oral Care Recommendations: Oral care BID Follow up Recommendations: Other (comment)(tba) SLP Visit Diagnosis: Dysphagia, unspecified (R13.10) Plan: Continue with current plan of care       [place], [place], [place] Speech-Language Pathologist [phone]  Aliene Altes 05/27/2017, 10:30 AM

## 2017-05-27 NOTE — Progress Notes (Signed)
[facility] Progress Note Patient Name: Bertha Lokken DOB: [birth year] MRN: [id number]   Date of Service  05/27/2017  HPI/Events of Note  K+ = 3.2, however, the patient is still getting K+ replacement ordered at 1 AM.   eICU Interventions  Will order: 1. Repeat BMP at 7 AM.     Intervention Category Major Interventions: Electrolyte abnormality - evaluation and management  Odysseus Cada Eugene 05/27/2017, 5:21 AM

## 2017-05-27 NOTE — Progress Notes (Signed)
STROKE TEAM PROGRESS NOTE   SUBJECTIVE (INTERVAL HISTORY) Her RN is at the bedside.  She is awake alert and following commands. Passed swallow and eating ice cream during round. BP controlled with cleviprex. Off D5 and will off insulin drip soon.   OBJECTIVE Temp:  [98.1 F (36.7 C)-99.1 F (37.3 C)] 98.7 F (37.1 C) (11/18 0741) Pulse Rate:  [101-131] 103 (11/18 0830) Cardiac Rhythm: Sinus tachycardia (11/18 0800) Resp:  [9-41] 18 (11/18 0830) BP: (107-187)/(59-129) 132/78 (11/18 0830) SpO2:  [86 %-99 %] 96 % (11/18 0830) Weight:  [191 lb 2.2 oz (86.7 kg)] 191 lb 2.2 oz (86.7 kg) (11/17 0900)  CBC:  Recent Labs  Lab 05/25/17 1955 05/26/17 0339  WBC 15.0* 14.5*  NEUTROABS 12.4*  --   HGB 11.5* 12.3  HCT 35.6* 37.3  MCV 95.4 93.3  PLT 309 818    Basic Metabolic Panel:  Recent Labs  Lab 05/26/17 0900 05/26/17 2235 05/27/17 0339  NA  --  147* 148*  148*  K  --  3.0* 3.2*  3.2*  CL  --  119* 118*  118*  CO2  --  22 21*  21*  GLUCOSE  --  187* 212*  212*  BUN  --  15 13  13   CREATININE  --  1.29* 1.20*  1.20*  CALCIUM  --  8.7* 8.5*  8.5*  MG 2.0  --   --   PHOS 1.9*  --   --     Lipid Panel:     Component Value Date/Time   CHOL 134 11/04/2016 0610   TRIG 131 11/04/2016 0610   HDL 26 (L) 11/04/2016 0610   CHOLHDL 5.2 11/04/2016 0610   VLDL 26 11/04/2016 0610   LDLCALC 82 11/04/2016 0610   HgbA1c:  Lab Results  Component Value Date   HGBA1C 14.6 (H) 05/26/2017   Urine Drug Screen:     Component Value Date/Time   LABOPIA NONE DETECTED 05/25/2017 2135   COCAINSCRNUR POSITIVE (A) 05/25/2017 2135   COCAINSCRNUR See Final Results 11/01/2016 0147   COCAINSCRNUR (A) 03/19/2008 2343    POSITIVE (NOTE) Sent for confirmatory testing Result repeated and verified.   LABBENZ NONE DETECTED 05/25/2017 2135   LABBENZ (A) 03/19/2008 2343    POSITIVE (NOTE) Sent for confirmatory testing Result repeated and verified.   AMPHETMU NONE DETECTED 05/25/2017 2135    THCU NONE DETECTED 05/25/2017 2135   LABBARB NONE DETECTED 05/25/2017 2135    Alcohol Level     Component Value Date/Time   ETH <10 05/25/2017 1955    IMAGING I have personally reviewed the radiological images below and agree with the radiology interpretations.  Ct Head Wo Contrast 05/26/2017 IMPRESSION:  1. No significant interval change in the size of the left thalamic hemorrhage. No new hemorrhage.  2. Ill-defined area of high attenuation in the pons similar to prior CT, and may be artifactual.   Ct Head Wo Contrast 05/26/2017   IMPRESSION:  Area of acute hemorrhage within the left thalamus measures 1.7 x 1.3 cm. Atrophy, chronic small vessel disease.   TTE pending    PHYSICAL EXAM Vitals:   05/27/17 0745 05/27/17 0800 05/27/17 0815 05/27/17 0830  BP: 136/72 136/75 124/65 132/78  Pulse: (!) 102 (!) 102 (!) 103 (!) 103  Resp: (!) 41 19 (!) 26 18  Temp:      TempSrc:      SpO2: 93% 95% 98% 96%  Weight:      Height:  Temp:  [98.1 F (36.7 C)-99.1 F (37.3 C)] 98.7 F (37.1 C) (11/18 0741) Pulse Rate:  [101-119] 118 (11/18 0905) Resp:  [13-41] 14 (11/18 0905) BP: (107-178)/(59-132) 141/99 (11/18 0905) SpO2:  [86 %-99 %] 97 % (11/18 0905)  General - Well nourished, well developed, in no apparent distress.  Ophthalmologic - Fundi not visualized due to noncooperation.  Cardiovascular - Regular and rhythm, but tachycardia.  Mental Status -  Level of arousal and orientation to time, place, and person were intact. Language including expression, repetition, comprehension was assessed and found intact, but mild dysarthria and naming 3/4  Cranial Nerves II - XII - II - Visual field intact OU. III, IV, VI - Extraocular movements intact. V - Facial sensation intact bilaterally. VII - Facial movement intact bilaterally. VIII - Hearing & vestibular intact bilaterally. X - Palate elevates symmetrically, mild dysarthria due to poor dentation XI - Chin turning  & shoulder shrug intact bilaterally. XII - Tongue protrusion intact.  Motor Strength - The patient's strength was normal in all extremities and pronator drift was absent.  Bulk was normal and fasciculations were absent.   Motor Tone - Muscle tone was assessed at the neck and appendages and was normal.  Reflexes - The patient's reflexes were 1+ in all extremities and she had no pathological reflexes.  Sensory - Light touch, temperature/pinprick were assessed and were symmetrical.    Coordination - The patient had normal movements in the hands with no ataxia or dysmetria.  Tremor was absent.  Gait and Station - deferred   ASSESSMENT/PLAN Ms. Waunetta Wiswell is a 61 y.o. 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 She did not receive IV t-PA due to hemorrhage.  Left small thalamic hemorrhage:  In the setting of cocaine use, uncontrolled HTN and DKA  Resultant  AMS resolved  CT head - Area of acute hemorrhage within the left thalamus measures 1.7 x 1.3 cm.  Carotid Doppler - 10/2016 negative  2D Echo - pending  LDL - pending  HgbA1c - 14.6  VTE prophylaxis - SCDs Diet NPO time specified  aspirin 81 mg daily prior to admission, now on No antithrombotic.  Ongoing aggressive stroke risk factor management  Therapy recommendations:  pending  Disposition:  Pending  Hx of ICH and stroke  06/2016 - left pontine subacute hemorrhage - MRA neg - EF 60-65% - LDL 181, A1C 12.6, CUS neg - discharged with lipitor  10/2016 - admitted for HONK and encephalopathy - seizure activity put on keppra - however incidental finding of right BG small infarct - CUS neg - put on ASA and continued lipitor  Dismissed from [facility] due to more than 3  no-shows  Hypertension  Stable  On cleviprex drip now  BP goal < 160  Long-term BP goal normotensive  Hyperlipidemia  Home meds:  Lipitor 40 mg dailyresumed in hospital  LDL pending, goal < 70  Continue statin at discharge  Diabetes  HgbA1c 14.6, goal < 7.0  Uncontrolled  Admitted for DKA  On insulin drip  CCM on board  Need close PCP follow up and DM education again  Cocaine abuse  UDS positive again for cocaine  Hx of cocaine use  Cessation counseling provided  Tobacco abuse  Current smoker  Smoking cessation counseling provided  Pt is willing to  quit  Other Stroke Risk Factors  ETOH use, advised to drink no more than 1 drink per day.  Family hx stroke (father)  Other Active Problems  Seizure history - Keppra 500 mg BID continued.  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 This patient's care requires constant monitoring of vital signs, hemodynamics, respiratory and cardiac monitoring, review of multiple databases, neurological assessment, discussion with family, other specialists and medical decision making of high complexity. I spent 35 minutes of neurocritical care time in the care of this patient.  Rosalin Hawking, MD PhD Stroke Neurology 05/27/2017 11:28 AM  To contact Stroke Continuity provider, please refer to [email or web address] After hours, contact General Neurology

## 2017-05-27 NOTE — Progress Notes (Signed)
Hypoglycemic Event  CBG: 56    Treatment: D50  Symptoms: None  Follow-up CBG: Time:2030 CBG Result:165  Possible Reasons for Event: Pt given 30 units of 70/30   Comments/MD notified: per hypoglycemia protocol    Seward Meth, Melodye Ped

## 2017-05-27 NOTE — Progress Notes (Deleted)

## 2017-05-27 NOTE — Evaluation (Signed)
Occupational Therapy Evaluation Patient Details Name: [name] MRN: [id number] DOB: 17-Sep-1955 Today's Date: 05/27/2017    History of Present Illness 61 y.o. female with with a history of CVA, hypertension, cocaine abuse, diabetes who presented with at least a 1 day history of altered mental status.  She came into the ER yesterday at 6 PM, and was found to be in DKA, AK I, leukocytosis, and was felt that these issues were contributing to a mild encephalopathy.  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   Clinical Impression   Pt currently requires mod assist +2 for stand pivot transfer and max assist overall for ADL. Pt presenting with generalized weakness, decreased activity tolerance, impaired cognition, poor sitting/standing balance impacting her independence and safety with ADL and functional mobility. Recommending SNF for follow up to maximize independence and safety with ADL and functional mobility prior to return home. Pt would benefit from continued skilled OT to address established goals.    Follow Up Recommendations  SNF;Supervision/Assistance - 24 hour    Equipment Recommendations  Other (comment)(TBD at next venue)    Recommendations for Other Services       Precautions / Restrictions Precautions Precautions: Fall Restrictions Weight Bearing Restrictions: No      Mobility Bed Mobility Overal bed mobility: Needs Assistance Bed Mobility: Supine to Sit     Supine to sit: Mod assist;HOB elevated     General bed mobility comments: Max multi modal cues to direct to task, able to bring LEs to EOB with significant increase in time, assist to elevate trunk to upright and rotate to EOB  Transfers Overall transfer level: Needs assistance Equipment used: 2 person hand held assist Transfers: Sit to/from [facility] Sit to Stand: Mod assist;From elevated  surface;+2 physical assistance Stand pivot transfers: Mod assist;From elevated surface;+2 physical assistance       General transfer comment: Increased time and effort to power up to standing, bilateral therapist assist required. patient able to take short shuffling pivotal steps to chair with guidance and assist for stability    Balance Overall balance assessment: Needs assistance Sitting-balance support: No upper extremity supported;Feet supported Sitting balance-Leahy Scale: Fair     Standing balance support: Bilateral upper extremity supported Standing balance-Leahy Scale: Poor Standing balance comment: requires increased hands on assist for static standing at this time                           ADL either performed or assessed with clinical judgement   ADL Overall ADL's : Needs assistance/impaired Eating/Feeding: NPO   Grooming: Moderate assistance;Sitting;Brushing hair   Upper Body Bathing: Maximal assistance;Sitting   Lower Body Bathing: Maximal assistance;Sit to/from stand   Upper Body Dressing : Maximal assistance;Sitting   Lower Body Dressing: Maximal assistance;Sit to/from stand   Toilet Transfer: Moderate assistance;+2 for physical assistance;Stand-pivot;BSC Toilet Transfer Details (indicate cue type and reason): Simulated by stand pivot EOB to chair         Functional mobility during ADLs: Moderate assistance;+2 for physical assistance(for stand pivot only)       Vision         Perception     Praxis      Pertinent Vitals/Pain Pain Assessment: Faces Faces Pain Scale: No hurt     Hand Dominance Right   Extremity/Trunk Assessment Upper Extremity Assessment Upper Extremity Assessment: Generalized weakness  Lower Extremity Assessment Lower Extremity Assessment: Defer to PT evaluation       Communication Communication Communication: No difficulties   Cognition Arousal/Alertness: Awake/alert Behavior During Therapy: Flat  affect Overall Cognitive Status: Impaired/Different from baseline Area of Impairment: Orientation;Attention;Memory;Following commands;Safety/judgement;Awareness;Problem solving                 Orientation Level: Disoriented to;Place;Time;Situation Current Attention Level: Sustained Memory: Decreased short-term memory Following Commands: Follows one step commands inconsistently;Follows one step commands with increased time Safety/Judgement: Decreased awareness of safety;Decreased awareness of deficits Awareness: Emergent Problem Solving: Slow processing;Decreased initiation;Difficulty sequencing;Requires verbal cues;Requires tactile cues     General Comments  VSS throughout    Exercises     Shoulder Instructions      Home Living Family/patient expects to be discharged to:: Private residence Living Arrangements: Spouse/significant other Available Help at Discharge: Family;Friend(s);Other (Comment) Type of Home: House Home Access: Level entry     Home Layout: One level     Bathroom Shower/Tub: Teacher, early years/pre: Standard     Home Equipment: Walker - 2 wheels          Prior Functioning/Environment Level of Independence: Independent                 OT Problem List: Decreased strength;Decreased range of motion;Decreased activity tolerance;Impaired balance (sitting and/or standing);Decreased cognition;Decreased safety awareness;Decreased knowledge of use of DME or AE;Obesity;Impaired UE functional use      OT Treatment/Interventions: Self-care/ADL training;Therapeutic exercise;Neuromuscular education;Energy conservation;DME and/or AE instruction;Therapeutic activities;Cognitive remediation/compensation;Patient/family education;Balance training    OT Goals(Current goals can be found in the care plan section) Acute Rehab OT Goals Patient Stated Goal: none stated OT Goal Formulation: With patient Time For Goal Achievement: 06/10/17 Potential to  Achieve Goals: Good ADL Goals Pt Will Perform Grooming: with supervision;sitting(x3 tasks) Pt Will Transfer to Toilet: with min guard assist;stand pivot transfer;bedside commode Pt Will Perform Toileting - Clothing Manipulation and hygiene: with min guard assist;sit to/from stand Additional ADL Goal #1: Pt will demonstrate selective attention during ADL.  OT Frequency: Min 2X/week   Barriers to D/C:            Co-evaluation PT/OT/SLP Co-Evaluation/Treatment: Yes Reason for Co-Treatment: Complexity of the patient's impairments (multi-system involvement);Necessary to address cognition/behavior during functional activity;For patient/therapist safety;To address functional/ADL transfers PT goals addressed during session: Mobility/safety with mobility OT goals addressed during session: ADL's and self-care      AM-PAC PT "6 Clicks" Daily Activity     Outcome Measure Help from another person eating meals?: Total Help from another person taking care of personal grooming?: A Lot Help from another person toileting, which includes using toliet, bedpan, or urinal?: A Lot Help from another person bathing (including washing, rinsing, drying)?: A Lot Help from another person to put on and taking off regular upper body clothing?: A Lot Help from another person to put on and taking off regular lower body clothing?: A Lot 6 Click Score: 11   End of Session Nurse Communication: Mobility status  Activity Tolerance: Patient tolerated treatment well Patient left: in chair;with call bell/phone within reach;with chair alarm set;with nursing/sitter in room  OT Visit Diagnosis: Unsteadiness on feet (R26.81);Other abnormalities of gait and mobility (R26.89);Muscle weakness (generalized) (M62.81)                Time: 6073-7106 OT Time Calculation (min): 20 min Charges:  OT General Charges $OT Visit: 1 Visit OT Evaluation $OT Eval Moderate Complexity: 1 Mod G-Codes:  [name] A. Ulice Brilliant, M.S.,  OTR/L Pager: [place] 05/27/2017, 1:41 PM

## 2017-05-27 NOTE — H&P (Signed)
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 Patient brought to the ER where she was found to be in DKA and was started on an insulin gtt. She remained somnolent though despite blood glucose improving. Head CT performed showing acute thalamic hemorrhage. Patient also found to have HTN emergency with BP 202/97. Neurology was consulted and recommended ICU admission and initiating Cleviprex gtt for BP control.    She also has an unstageable ulcer on lower leg.  Admits to using cocaine , confirmed by UDS  SUBJ - oob to chair, on insulin gtt Denies CP, headache Wants water   VITAL SIGNS: BP (!) 141/99   Pulse (!) 118   Temp 98.7 F (37.1 C) (Axillary)   Resp 14   Ht 5\' 8"  (1.727 m)   Wt 191 lb 2.2 oz (86.7 kg)   SpO2 97%   BMI 29.06 kg/m   INTAKE / OUTPUT: I/O last 3 completed shifts: In: 7114.3 [I.V.:3876.3; IV Piggyback:3238] Out: 1250 [Urine:1250]  PHYSICAL EXAMINATION: General: Chronically ill appearing elderly woman, appears older than stated age Neuro: Awake, alert, interactivel, 5/5 strength BUE's, PERRL HEENT: OP clear, MM dry Cardiovascular: Tachycardic with a regular rhythm, no m/r/g Lungs: CTA b/l Abdomen: Obese, Soft NTND, excoriations with surrounding pustules on inferior aspect of pannus and in inguinal area Musculoskeletal: sacral decubitus ulcer; unstageable ulcer on right medial calf with surrounding tenderness; PVD  changes on BLE's Skin: as above  LABS:  BMET Recent Labs  Lab 05/26/17 0339 05/26/17 2235 05/27/17 0339  NA 149* 147* 148*  148*  K 4.0 3.0* 3.2*  3.2*  CL 118* 119* 118*  118*  CO2 12* 22 21*  21*  BUN 24* 15 13  13   CREATININE 2.11* 1.29* 1.20*  1.20*  GLUCOSE 226* 187* 212*  212*   Electrolytes Recent Labs  Lab 05/26/17 0339 05/26/17 0900 05/26/17 2235 05/27/17 0339  CALCIUM 9.2  --  8.7* 8.5*  8.5*  MG  --  2.0  --   --   PHOS  --  1.9*  --   --    CBC Recent Labs  Lab 05/25/17 1955 05/26/17 0339  WBC 15.0* 14.5*  HGB 11.5* 12.3  HCT 35.6* 37.3  PLT 309 294   Coag's Recent Labs  Lab 05/25/17 1955  INR 1.28   Sepsis Markers Recent Labs  Lab 05/25/17 2251 05/26/17 0539 05/26/17 0920  LATICACIDVEN 2.70* 1.8 2.0*  PROCALCITON  --  <0.10  --    ABG No results for input(s): PHART, PCO2ART, PO2ART in the last 168 hours.  Liver Enzymes Recent Labs  Lab 05/25/17 1955  AST 12*  ALT 9*  ALKPHOS 58  BILITOT 2.0*  ALBUMIN 2.7*   Cardiac Enzymes No results for input(s): TROPONINI, PROBNP in the last 168 hours.  Glucose Recent Labs  Lab 05/27/17 0309 05/27/17 0424 05/27/17 0520 05/27/17 0611 05/27/17 0720 05/27/17 0827  GLUCAP 168* 199* 221* 187* 161* 126*   Imaging Ct Head Wo Contrast  Result Date: 05/26/2017 CLINICAL DATA:  61 year old female with altered level of consciousness and recent intracranial hemorrhage. EXAM: CT HEAD WITHOUT CONTRAST TECHNIQUE: Contiguous axial images were obtained from the base of the skull through the vertex without intravenous contrast. COMPARISON:  Earlier head CT dated 05/26/2017 FINDINGS: Brain: No significant interval change in the size of the left thalamic hemorrhage now measures approximately 14 x 15 mm. There is associated minimal mass effect on the third ventricle similar to prior CT. No midline shift. No new hemorrhage noted. An ill-defined area of high attenuation in the pons (series 3, image  9) appears similar to the earlier CT. The ventricles and sulci appropriate size for patient's age. Mild periventricular and deep white matter chronic microvascular ischemic changes noted. No extra-axial fluid collection. Vascular: No hyperdense vessel or unexpected calcification. Skull: Normal. Negative for fracture or focal lesion. Sinuses/Orbits: No acute finding. Other: None IMPRESSION: 1. No significant interval change in the size of the left thalamic hemorrhage. No new hemorrhage. 2. Ill-defined area of high attenuation in the pons similar to prior CT, and may be artifactual. Electronically Signed   By: Anner Crete M.D.   On: 05/26/2017 18:20   STUDIES:  Head CT (11/17): Area of acute hemorrhage within the left thalamus measures 1.7 x 1.3cm. Atrophy, chronic small vessel disease. CXR (11/16): no acute abnormality  CULTURES: Blood cultures (11/16): pending Urine culture (11/16): pending MRSA nares (11/17): positive  SIGNIFICANT EVENTS: 11/16: presented to ER with Hyperglycemia and AMS 11/17: AMS not improving with resolution of DKA; Head CT showed thalamic bleed; Patient found to be in HTN emergency >> Neuro consult, admit to ICU   LINES/TUBES: PIV's Foley 11/17 >>  ASSESSMENT / PLAN: 61yoF with hx HTN, DM, Cocaine abuse, Tobacco abuse, HCV, Etoh abuse, SZ disorder, Grade 1 Diastolic CHF, [place]ine ICH (06/2016) with Right-sided hemiparesis, Lacunar infarct (10/2016), who has had multiple admissions for DKA including most recently 11/1-11/6, who is now admitted with DKA & acute thalamic hemorrhage , HTN emergency insetting of cocaine use  She also has an unstageable ulcer on right leg.   NEUROLOGIC 1. Acute Thalamic Hemorrhage; Hx CVA's; Hx SZ's; Cocaine abuse - acute thalamic hemorrhage, possibly due to cocaine abuse in setting of HTN emergency - appreciate Neurology consult -  Speech consult, then advance PO - Continue  home dose of Keppra  2. Hx Etoh abuse: - monitor CIWA; give  benzos PRN - start folate, thiamine, MVI  ENDOCRINE 1. DKA:  Confirmed by high BHB -  - Now that  AG closed >> transition to her home regimen of long-acting insulin and SSI; Do question with her history of noncompliance with insulin whether once daily Lantus would be easier for her to take then her current Novolog 70/30.   PULMONARY 1. Hx Tobacco abuse: - no nicotine patch now per Neuro  CARDIOVASCULAR 1. HTN Emergency; Hx HTN: - continue Cleviprex gtt - holding home labetalol in setting of cocaine abuse -hold lisinopril due to AKI - continue norvasc 10mg  daily, Hydralazine IV PRN - SBP goal <140  2. Hx Grade 1 Diastolic Dysfunction: - caution not to fluid overload   RENAL 1. AKI: - Creatinine 2.59 on admission, up from baseline of 0.96; now  improved  - place foley catheter; monitor UOP; avoid nephrotoxic agents  - Send UA -hold lisinopril -change to NS  GASTROINTESTINAL 1. Nutrition: NPO; Speech consult 2. Hx HCV  HEMATOLOGIC No acute issues   INFECTIOUS 1. Multiple Excoriations and Ulcerations with questionable secondary  bacterial infection: - excoriations on panus and perineum area with multiple small pustules in periphery; question bacterial super-infection. Nystatin cream PRN, add diflucan 100 mg daily x 5ds - sacral ulcer and unstageable calf ulcer.    - wound care consult.  - empiric ABx given tenderness around leg wound   FAMILY  - Updates: no family at bedside at time of my exam - Inter-disciplinary family meet or Palliative Care meeting due by: 06/02/17   My cc time x 35m  Kara Mead MD. [facility]. [facility] Pager [phone] If no response call [phone]     05/27/2017, 9:40 AM

## 2017-05-27 NOTE — Progress Notes (Signed)
CRITICAL VALUE ALERT  Critical Value:  K 3.0  Date & Time Notied: 05/27/2017 0005  Provider Notified: Abby at [facility]  Orders Received/Actions taken: No order received at this time, she will let Oletta Darter know

## 2017-05-27 NOTE — Progress Notes (Signed)
Peripherally Inserted Central Catheter/Midline Placement  The IV Nurse has discussed with the patient and/or persons authorized to consent for the patient, the purpose of this procedure and the potential benefits and risks involved with this procedure.  The benefits include less needle sticks, lab draws from the catheter, and the patient may be discharged home with the catheter. Risks include, but not limited to, infection, bleeding, blood clot (thrombus formation), and puncture of an artery; nerve damage and irregular heartbeat and possibility to perform a PICC exchange if needed/ordered by physician.  Alternatives to this procedure were also discussed.  Bard Power PICC patient education guide, fact sheet on infection prevention and patient information card has been provided to patient /or left at bedside.    PICC/Midline Placement Documentation  PICC Double Lumen 05/27/17 PICC Right Brachial 42 cm 1 cm (Active)  Indication for Insertion or Continuance of Line Prolonged intravenous therapies 05/27/2017 12:00 PM  Exposed Catheter (cm) 1 cm 05/27/2017 12:00 PM  Site Assessment Clean;Dry;Intact 05/27/2017 12:00 PM  Lumen #1 Status Flushed;Blood return noted;Saline locked 05/27/2017 12:00 PM  Lumen #2 Status Flushed;Blood return noted;Saline locked 05/27/2017 12:00 PM  Dressing Type Transparent 05/27/2017 12:00 PM  Dressing Status Clean;Dry;Intact;Antimicrobial disc in place 05/27/2017 12:00 PM  Line Care Connections checked and tightened 05/27/2017 12:00 PM  Line Adjustment (NICU/IV Team Only) No 05/27/2017 12:00 PM  Dressing Intervention New dressing 05/27/2017 12:00 PM  Dressing Change Due 06/03/17 05/27/2017 12:00 PM       [name] 05/27/2017, 12:32 PM

## 2017-05-27 NOTE — Progress Notes (Signed)
[place] Progress Note Patient Name: Merin Borjon DOB: 1955-12-23 MRN: [id number]   Date of Service  05/27/2017  HPI/Events of Note  K+ = 3.0 and Creatinine = 1.29.  eICU Interventions  Will replace K+.      Intervention Category Major Interventions: Electrolyte abnormality - evaluation and management  Sommer,Steven Eugene 05/27/2017, 1:18 AM

## 2017-05-27 NOTE — Progress Notes (Signed)
[place] Progress Note Patient Name: [name] DOB: [birth year] MRN: [id number]   Date of Service  05/27/2017  HPI/Events of Note  K+ = 3.0 and Creatinine = 1.0.   eICU Interventions  Will replace K+.     Intervention Category Major Interventions: Electrolyte abnormality - evaluation and management  Kaye Luoma Eugene 05/27/2017, 7:32 PM

## 2017-05-27 NOTE — Progress Notes (Signed)
OT Cancellation Note  Patient Details Name: [name] MRN: [id number] DOB: 1956-04-23   Cancelled Treatment:    Reason Eval/Treat Not Completed: Patient not medically ready(active bedrest orders).  Binnie Kand M.S., OTR/L Pager: [phone]  05/27/2017, 8:47 AM

## 2017-05-27 NOTE — Evaluation (Signed)
Physical Therapy Evaluation Patient Details Name: [name] MRN: [id number] DOB: 25-Dec-1955 Today's Date: 05/27/2017   History of Present Illness  61 y.o. female with with a history of CVA, hypertension, cocaine abuse, diabetes who presented with at least a 1 day history of altered mental status.  She came into the ER yesterday at 6 PM, and was found to be in DKA, AK I, leukocytosis, and was felt that these issues were contributing to a mild encephalopathy.  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  Clinical Impression  Orders received for PT evaluation. Patient demonstrates deficits in functional mobility as indicated below. Will benefit from continued skilled PT to address deficits and maximize function. Will see as indicated and progress as tolerated.  This is patient's 2nd admission for DKA in 2 weeks, patient noted to have significant deficits and limited ability to perform functional mobility tasks at this time, will need post acute rehabilitation.     Follow Up Recommendations SNF    Equipment Recommendations  None recommended by PT    Recommendations for Other Services       Precautions / Restrictions Precautions Precautions: Fall Restrictions Weight Bearing Restrictions: No      Mobility  Bed Mobility Overal bed mobility: Needs Assistance Bed Mobility: Supine to Sit     Supine to sit: Mod assist;HOB elevated     General bed mobility comments: Max multi modal cues to direct to task, able to bring LEs to EOB with significant increase in time, assist to elevate trunk to upright and rotate to EOB  Transfers Overall transfer level: Needs assistance Equipment used: 2 person hand held assist Transfers: Sit to/from [facility] Sit to Stand: Mod assist;From elevated surface Stand pivot transfers: Mod assist;From elevated surface       General transfer  comment: Increased time and effort to power up to standing, bilateral therapist assist required. patient able to take short shuffling pivotal steps to chair with guidance and assist for stability  Ambulation/Gait                Stairs            Wheelchair Mobility    Modified Rankin (Stroke Patients Only)       Balance Overall balance assessment: Needs assistance Sitting-balance support: No upper extremity supported;Feet supported Sitting balance-Leahy Scale: Fair         Standing balance comment: requires increased hands on assist for static standing at this time                             Pertinent Vitals/Pain Pain Assessment: Faces Faces Pain Scale: No hurt    Home Living Family/patient expects to be discharged to:: Private residence Living Arrangements: Spouse/significant other Available Help at Discharge: Family;Friend(s);Other (Comment) Type of Home: House Home Access: Level entry     Home Layout: One level Home Equipment: Walker - 2 wheels      Prior Function Level of Independence: Independent               Hand Dominance   Dominant Hand: Right    Extremity/Trunk Assessment   Upper Extremity Assessment Upper Extremity Assessment: Generalized weakness    Lower Extremity Assessment Lower Extremity Assessment: Generalized weakness;Difficult to assess due to impaired cognition       Communication   Communication: No difficulties  Cognition Arousal/Alertness: Awake/alert Behavior During Therapy: Flat affect Overall Cognitive Status: Impaired/Different from baseline Area of Impairment: Orientation;Attention;Memory;Following commands;Safety/judgement;Awareness;Problem solving                 Orientation Level: Disoriented to;Place;Time;Situation Current Attention Level: Sustained   Following Commands: Follows one step commands inconsistently;Follows one step commands with increased time Safety/Judgement:  Decreased awareness of safety;Decreased awareness of deficits Awareness: Emergent Problem Solving: Slow processing;Decreased initiation;Difficulty sequencing;Requires verbal cues;Requires tactile cues        General Comments      Exercises     Assessment/Plan    PT Assessment Patient needs continued PT services  PT Problem List Decreased strength;Decreased mobility;Decreased activity tolerance;Decreased balance;Decreased knowledge of use of DME;Pain       PT Treatment Interventions DME instruction;Therapeutic activities;Cognitive remediation;Gait training;Therapeutic exercise;Patient/family education;Functional mobility training;Balance training;Wheelchair mobility training    PT Goals (Current goals can be found in the Care Plan section)  Acute Rehab PT Goals Patient Stated Goal: none stated PT Goal Formulation: With patient Time For Goal Achievement: 05/28/17 Potential to Achieve Goals: Fair    Frequency Min 2X/week   Barriers to discharge        Co-evaluation PT/OT/SLP Co-Evaluation/Treatment: Yes Reason for Co-Treatment: Complexity of the patient's impairments (multi-system involvement);Necessary to address cognition/behavior during functional activity;To address functional/ADL transfers;For patient/therapist safety PT goals addressed during session: Mobility/safety with mobility OT goals addressed during session: ADL's and self-care       AM-PAC PT "6 Clicks" Daily Activity  Outcome Measure Difficulty turning over in bed (including adjusting bedclothes, sheets and blankets)?: A Lot Difficulty moving from lying on back to sitting on the side of the bed? : Unable Difficulty sitting down on and standing up from a chair with arms (e.g., wheelchair, bedside commode, etc,.)?: Unable Help needed moving to and from a bed to chair (including a wheelchair)?: A Lot Help needed walking in hospital room?: A Lot Help needed climbing 3-5 steps with a railing? : Total 6 Click  Score: 9    End of Session Equipment Utilized During Treatment: Gait belt Activity Tolerance: Patient limited by pain Patient left: in chair;with call bell/phone within reach;with chair alarm set Nurse Communication: Mobility status PT Visit Diagnosis: Difficulty in walking, not elsewhere classified (R26.2)    Time: 9935-7017 PT Time Calculation (min) (ACUTE ONLY): 20 min   Charges:   PT Evaluation $PT Eval Moderate Complexity: 1 Mod     PT G Codes:        Alben Deeds, PT DPT  Board Certified Neurologic Specialist [phone]   Duncan Dull 05/27/2017, 12:31 PM

## 2017-05-28 ENCOUNTER — Inpatient Hospital Stay (HOSPITAL_COMMUNITY): Payer: Medicaid Other

## 2017-05-28 ENCOUNTER — Encounter (HOSPITAL_COMMUNITY): Payer: Self-pay

## 2017-05-28 DIAGNOSIS — N170 Acute kidney failure with tubular necrosis: Secondary | ICD-10-CM

## 2017-05-28 DIAGNOSIS — K219 Gastro-esophageal reflux disease without esophagitis: Secondary | ICD-10-CM

## 2017-05-28 LAB — BASIC METABOLIC PANEL
ANION GAP: 5 (ref 5–15)
Anion gap: 8 (ref 5–15)
BUN: 5 mg/dL — AB (ref 6–20)
BUN: 5 mg/dL — AB (ref 6–20)
CHLORIDE: 114 mmol/L — AB (ref 101–111)
CHLORIDE: 108 mmol/L (ref 101–111)
CO2: 20 mmol/L — ABNORMAL LOW (ref 22–32)
CO2: 19 mmol/L — ABNORMAL LOW (ref 22–32)
CREATININE: 1.19 mg/dL — AB (ref 0.44–1.00)
Calcium: 7.8 mg/dL — ABNORMAL LOW (ref 8.9–10.3)
Calcium: 7.8 mg/dL — ABNORMAL LOW (ref 8.9–10.3)
Creatinine, Ser: 0.97 mg/dL (ref 0.44–1.00)
GFR calc Af Amer: >60 mL/min (ref >60)
GFR calc Af Amer: 56 mL/min — ABNORMAL LOW (ref >60)
GFR calc non Af Amer: 49 mL/min — ABNORMAL LOW (ref >60)
GLUCOSE: 61 mg/dL — AB (ref 65–99)
GLUCOSE: 349 mg/dL — AB (ref 65–99)
POTASSIUM: 3.2 mmol/L — AB (ref 3.5–5.1)
POTASSIUM: 5.5 mmol/L — AB (ref 3.5–5.1)
Sodium: 139 mmol/L (ref 135–145)
Sodium: 135 mmol/L (ref 135–145)

## 2017-05-28 LAB — LIPID PANEL
CHOL/HDL RATIO: 2.5 ratio
Cholesterol: 90 mg/dL (ref 0–200)
HDL: 36 mg/dL — AB (ref >40)
LDL CALC: 39 mg/dL (ref 0–99)
Triglycerides: 77 mg/dL (ref <150)
VLDL: 15 mg/dL (ref 0–40)

## 2017-05-28 LAB — MAGNESIUM: Magnesium: 1.2 mg/dL — ABNORMAL LOW (ref 1.7–2.4)

## 2017-05-28 LAB — CBC
HCT: 32.9 % — ABNORMAL LOW (ref 36.0–46.0)
HEMOGLOBIN: 10.7 g/dL — AB (ref 12.0–15.0)
MCH: 29.6 pg (ref 26.0–34.0)
MCHC: 32.5 g/dL (ref 30.0–36.0)
MCV: 91.1 fL (ref 78.0–100.0)
PLATELETS: 205 10*3/uL (ref 150–400)
RBC: 3.61 MIL/uL — AB (ref 3.87–5.11)
RDW: 13 % (ref 11.5–15.5)
WBC: 9.7 10*3/uL (ref 4.0–10.5)

## 2017-05-28 LAB — GLUCOSE, CAPILLARY
GLUCOSE-CAPILLARY: 64 mg/dL — AB (ref 65–99)
GLUCOSE-CAPILLARY: 110 mg/dL — AB (ref 65–99)
GLUCOSE-CAPILLARY: 399 mg/dL — AB (ref 65–99)
GLUCOSE-CAPILLARY: 261 mg/dL — AB (ref 65–99)
Glucose-Capillary: 265 mg/dL — ABNORMAL HIGH (ref 65–99)
Glucose-Capillary: 95 mg/dL (ref 65–99)

## 2017-05-28 LAB — PHOSPHORUS: Phosphorus: 1.3 mg/dL — ABNORMAL LOW (ref 2.5–4.6)

## 2017-05-28 MED ORDER — INSULIN ASPART PROT & ASPART (70-30 MIX) 100 UNIT/ML ~~LOC~~ SUSP
15.0000 [IU] | Freq: Two times a day (BID) | SUBCUTANEOUS | Status: DC
  Administered 2017-05-28 – 2017-05-31 (×6): 15 [IU] via SUBCUTANEOUS
  Filled 2017-05-28: qty 10

## 2017-05-28 MED ORDER — POTASSIUM CHLORIDE 10 MEQ/100ML IV SOLN
10.0000 meq | INTRAVENOUS | Status: DC
  Administered 2017-05-28 (×3): 10 meq via INTRAVENOUS
  Filled 2017-05-28 (×4): qty 100

## 2017-05-28 MED ORDER — POTASSIUM PHOSPHATES 15 MMOLE/5ML IV SOLN
30.0000 mmol | Freq: Once | INTRAVENOUS | Status: AC
  Administered 2017-05-28: 30 mmol via INTRAVENOUS
  Filled 2017-05-28: qty 10

## 2017-05-28 MED ORDER — COLLAGENASE 250 UNIT/GM EX OINT
TOPICAL_OINTMENT | Freq: Every day | CUTANEOUS | Status: DC
  Administered 2017-05-28 – 2017-06-04 (×8): via TOPICAL
  Filled 2017-05-28: qty 30

## 2017-05-28 MED ORDER — POTASSIUM CHLORIDE 10 MEQ/100ML IV SOLN: 10.0000 meq | INTRAVENOUS | Status: DC

## 2017-05-28 MED ORDER — NYSTATIN 100000 UNIT/GM EX CREA
TOPICAL_CREAM | Freq: Two times a day (BID) | CUTANEOUS | Status: DC
  Administered 2017-05-28 – 2017-06-04 (×14): via TOPICAL
  Filled 2017-05-28 (×2): qty 15

## 2017-05-28 MED ORDER — DEXTROSE-NACL 5-0.45 % IV SOLN
INTRAVENOUS | Status: DC
  Administered 2017-05-28 – 2017-05-29 (×2): via INTRAVENOUS

## 2017-05-28 NOTE — Progress Notes (Signed)
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 Patient brought to the ER where she was found to be in DKA and was started on an insulin gtt. She remained somnolent though despite blood glucose improving. Head CT performed showing acute thalamic hemorrhage. Patient also found to have HTN emergency with BP 202/97. Neurology was consulted and recommended ICU admission and initiating Cleviprex gtt for BP control.   SUBJECTIVE/INTERVAL:  Feels good this AM. Was cleared for thin liquids but significant cough with drinking. [place] with eating. BP well controlled. Hypoglycemic this AM.   VITAL SIGNS: BP 129/76   Pulse 99   Temp 99.2 F (37.3 C) (Oral)   Resp 19   Ht '5\' 8"'$  (1.727 m)   Wt 86.7 kg (191 lb 2.2 oz)   SpO2 94%   BMI 29.06 kg/m   INTAKE / OUTPUT: I/O last 3 completed shifts: In: 0034.9 [P.O.:1440; I.V.:2480.1; IV [id number]:7948] Out: 0165 [Urine:3035]  PHYSICAL EXAMINATION:  General: Chronically ill appearing elderly woman Neuro: Awake, alert, oriented. 5/5 strength x 4 extremities HEENT: [place]/AT, PERRL, no JVD Cardiovascular: Tachycardic with a regular rhythm, no m/r/g Lungs: CTA b/l Abdomen: Soft, non-tender, non-distended. Several excoriated areas and pustules to lower abdomen and perineal area.  Musculoskeletal: sacral decubitus ulcer; unstageable ulcer on right medial calf with surrounding tenderness; PVD changes on BLE's Skin: as  above  LABS:  BMET Recent Labs  Lab 05/27/17 0339 05/27/17 1725 05/28/17 0501  NA 148*  148* 144 139  K 3.2*  3.2* 3.0* 3.2*  CL 118*  118* 118* 114*  CO2 21*  21* 22 20*  BUN '13  13 8 '$ 5*  CREATININE 1.20*  1.20* 1.00 0.97  GLUCOSE 212*  212* 74 61*   Electrolytes Recent Labs  Lab 05/26/17 0900  05/27/17 0339 05/27/17 1725 05/28/17 0501  CALCIUM  --    < > 8.5*  8.5* 8.5* 7.8*  MG 2.0  --   --   --  1.2*  PHOS 1.9*  --   --   --  1.3*   < > = values in this interval not displayed.   CBC Recent Labs  Lab 05/25/17 1955 05/26/17 0339 05/28/17 0501  WBC 15.0* 14.5* 9.7  HGB 11.5* 12.3 10.7*  HCT 35.6* 37.3 32.9*  PLT 309 294 205   Coag's Recent Labs  Lab 05/25/17 1955  INR 1.28   Sepsis Markers Recent Labs  Lab 05/25/17 2251 05/26/17 0539 05/26/17 0920  LATICACIDVEN 2.70* 1.8 2.0*  PROCALCITON  --  <0.10  --    ABG No results for input(s): PHART, PCO2ART, PO2ART in the last 168 hours.  Liver Enzymes Recent Labs  Lab 05/25/17 1955  AST 12*  ALT 9*  ALKPHOS 58  BILITOT 2.0*  ALBUMIN 2.7*   Cardiac Enzymes No results for input(s): TROPONINI, PROBNP in the last 168 hours.  Glucose Recent Labs  Lab 05/27/17 2021 05/27/17 2328 05/28/17 0001 05/28/17 0336 05/28/17 0801 05/28/17 0833  GLUCAP 165* 45*  147* 95 64* 110*   Imaging No results found. STUDIES:  Head CT (11/17): Area of acute hemorrhage within the left thalamus measures 1.7 x 1.3cm. Atrophy, chronic small vessel disease. 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    NEUROLOGIC P: Acute Thalamic Hemorrhage: possibly due to cocaine abuse in setting of HTN emergency Cocaine abuse Hx CVA, Seizure, ETOH   - appreciate Neurology consult - Need speech to revisit as she is having trouble with thin liquids.  - continue home dose of Keppra - monitor CIWA; give benzos PRN - start folate, thiamine, MVI  ENDOCRINE P: DKA:  Confirmed by high BHB > AG closed 11/18 - Decrease dose on 70/30 as she has been hypoglycemic overnight.  - D5 started - May be a better candidate for latus/levemir with her noncompliance (daily rather than twice daily)  PULMONARY Hx Tobacco abuse: - no nicotine patch now per Neuro  CARDIOVASCULAR HTN Emergency;  With hx HTN: Chronic diastolic CHF P: - Cleviprex off - holding home labetalol in setting of cocaine abuse - hold lisinopril due to AKI - Well controlled on Norvasc '10mg'$  - SBP goal 140-160  RENAL NIO:[id number] 2.59 on admission, up from baseline of 0.96; now  improved  P: - DC foley - D5  GASTROINTESTINAL 1. Nutrition: heart healthy 2. Hx HCV  Speech consult revisit as consistently coughing with liquids.   HEMATOLOGIC No acute issues   INFECTIOUS 1. Multiple Excoriations and Ulcerations with questionable secondary bacterial infection: - excoriations on panus and perineum area with multiple small pustules in periphery; question bacterial super-infection. - Nystatin cream PRN, add diflucan 100 mg daily x 5ds (day 2) - sacral ulcer and unstageable calf ulcer.    - wound care consult.  -  empiric ABX given tenderness around leg wound - Low threshold to deescalate/DC as procal normal, no fevers, no WBC abnormality  Global: OT/PT recommend SNF  FAMILY  - Updates: no family at bedside at time of my exam - Inter-disciplinary family meet or Palliative Care meeting due by: 06/02/17  Will transfer to floor   Georgann Housekeeper, AGACNP-BC [place] [facility] Pager [phone] or [phone]  05/28/2017 9:54 AM  .STAFF NOTE: I, Merrie Roof, MD FACP have personally reviewed patient's available data, including medical history, events of note, physical examination and test results as part of my evaluation. I have discussed with resident/NP and other care providers such as pharmacist, RN and RRT. In addition, I personally evaluated patient and elicited key findings of: awake, alert, nonfocal examination, able to cough and clear secretions, no distress, lungs clear, does cough with liquids, abdo soft, edema mild, pcxr none, ct head I reviewed x 2 with small lacunar bleed from HTN, HTN goals  met per neuro, consider further control at day 3, need SLP, wound care for wounds, supp, kp, phos, mag, re assess lytes in am , tele, glu as low as 64, caution escallation insulin products, limit lantus to daily when used, I udpated pt In full  [employer]. Titus Mould, MD, Jal Pgr: [facility] 05/28/2017 11:07 AM

## 2017-05-28 NOTE — Plan of Care (Signed)
Patient in need of help with wound care and self care.  Discussed need to stop illicit drug use which is contributing to risk of strokes, ulcerations, and ability to care for self.

## 2017-05-28 NOTE — Progress Notes (Signed)
eLink Physician-Brief Progress Note Patient Name: [name] DOB: [birth year] MRN: [id number]   Date of Service  05/28/2017  HPI/Events of Note  Hypoglycemia - Blood glucose = 45. Na+ = 144.  eICU Interventions  Will change IV fluid to D5 0.45 NaCl to run IV at 50 mL/hour.      Intervention Category Major Interventions: Other:  [name] 05/28/2017, 12:07 AM

## 2017-05-28 NOTE — Progress Notes (Signed)
Inpatient Diabetes Program Recommendations  AACE/ADA: New Consensus Statement on Inpatient Glycemic Control (2015)  Target Ranges:  Prepandial:   less than 140 mg/dL      Peak postprandial:   less than 180 mg/dL (1-2 hours)      Critically ill patients:  140 - 180 mg/dL   Lab Results  Component Value Date   GLUCAP 110 (H) 05/28/2017   HGBA1C 14.6 (H) 05/26/2017    Review of Glycemic Control  Diabetes history: DM+ Outpatient Diabetes medications: 70/30 30 units BID Current orders for Inpatient glycemic control: 70/30 15 units BID, Novolog MODERATE every 4 hours.   Inpatient Diabetes Program Recommendations:  Noted that blood sugars have been up and down, less than 70 mg/dl.  HgbA1C is 14.6%. Consider changing insulin to Lantus 15 units daily while in the hospital and while patient's appetite labile. Will continue to monitor blood sugars while in the hospital.   Harvel Ricks RN BSN CDE Diabetes Coordinator Pager: [phone]  8am-5pm

## 2017-05-28 NOTE — Progress Notes (Signed)
  Speech Language Pathology Treatment: Dysphagia  Patient Details Name: [name] MRN: [id number] DOB: 05/19/1956 Today's Date: 05/28/2017 Time: 0102-7253 SLP Time Calculation (min) (ACUTE ONLY): 10 min  Assessment / Plan / Recommendation Clinical Impression  Pt seen at bedside for dysphagia management and PO tolerance; per RN report, pt has been demonstrating extensive immediate cough following thin and nectar-thick liquids. Pt observed by SLP with nectar-thick consistency; delayed throat clear and delayed cough noted following single sips via cup. Given pt's medical hx significant of previous CVA, dysphagia and esophageal deficits, objective study is warranted to assess adequate airway protection during swallow. Will follow-up with recommendations following MBS scheduled for this afternoon.    HPI HPI: [name] is a 61 y.o. female with with a history of CVA, hypertension, cocaine abuse, diabetes who presented with at least a 1 day history of altered mental status.  She came into the ER yesterday at 6 PM, and was found to be in DKA, AK I, leukocytosis, and was felt that these issues were contributing to a mild encephalopathy.  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      SLP Plan  New goals to be determined pending instrumental study;Continue with current plan of care       Recommendations  Diet recommendations: Other(comment)(TBD pending MBS)                Oral Care Recommendations: Oral care BID Follow up Recommendations: Other (comment)(TBD) SLP Visit Diagnosis: Dysphagia, unspecified (R13.10) Plan: New goals to be determined pending instrumental study;Continue with current plan of  care       [place], Student SLP 05/28/2017, 10:28 AM

## 2017-05-28 NOTE — Progress Notes (Signed)
STROKE TEAM PROGRESS NOTE   SUBJECTIVE (INTERVAL HISTORY) No family at bedside. She is awake alert and following commands, feeding self breakfast.  Voices no new complaints.  No acute events reported overnight. +cough reported by nursing. SLP to re-evaluate. Hypoglycemic this AM  OBJECTIVE Temp:  [98.5 F (36.9 C)-99.2 F (37.3 C)] 99.2 F (37.3 C) (11/19 0800) Pulse Rate:  [96-121] 104 (11/19 1100) Cardiac Rhythm: Sinus tachycardia (11/19 0800) Resp:  [0-39] 17 (11/19 1100) BP: (83-208)/(64-174) 154/92 (11/19 1100) SpO2:  [93 %-100 %] 100 % (11/19 1100)  CBC:  Recent Labs  Lab 05/25/17 1955 05/26/17 0339 05/28/17 0501  WBC 15.0* 14.5* 9.7  NEUTROABS 12.4*  --   --   HGB 11.5* 12.3 10.7*  HCT 35.6* 37.3 32.9*  MCV 95.4 93.3 91.1  PLT 309 294 938    Basic Metabolic Panel:  Recent Labs  Lab 05/26/17 0900  05/27/17 1725 05/28/17 0501  NA  --    < > 144 139  K  --    < > 3.0* 3.2*  CL  --    < > 118* 114*  CO2  --    < > 22 20*  GLUCOSE  --    < > 74 61*  BUN  --    < > 8 5*  CREATININE  --    < > 1.00 0.97  CALCIUM  --    < > 8.5* 7.8*  MG 2.0  --   --  1.2*  PHOS 1.9*  --   --  1.3*   < > = values in this interval not displayed.    Lipid Panel:     Component Value Date/Time   CHOL 90 05/28/2017 0501   TRIG 77 05/28/2017 0501   HDL 36 (L) 05/28/2017 0501   CHOLHDL 2.5 05/28/2017 0501   VLDL 15 05/28/2017 0501   LDLCALC 39 05/28/2017 0501   HgbA1c:  Lab Results  Component Value Date   HGBA1C 14.6 (H) 05/26/2017   Urine Drug Screen:     Component Value Date/Time   LABOPIA NONE DETECTED 05/25/2017 2135   COCAINSCRNUR POSITIVE (A) 05/25/2017 2135   COCAINSCRNUR See Final Results 11/01/2016 0147   COCAINSCRNUR (A) 03/19/2008 2343    POSITIVE (NOTE) Sent for confirmatory testing Result repeated and verified.   LABBENZ NONE DETECTED 05/25/2017 2135   LABBENZ (A) 03/19/2008 2343    POSITIVE (NOTE) Sent for confirmatory testing Result repeated and  verified.   AMPHETMU NONE DETECTED 05/25/2017 2135   THCU NONE DETECTED 05/25/2017 2135   LABBARB NONE DETECTED 05/25/2017 2135    Alcohol Level     Component Value Date/Time   ETH <10 05/25/2017 1955   IMAGING I have personally reviewed the radiological images below and agree with the radiology interpretations.  Ct Head Wo Contrast 05/26/2017 IMPRESSION:  1. No significant interval change in the size of the left thalamic hemorrhage. No new hemorrhage.  2. Ill-defined area of high attenuation in the pons similar to prior CT, and may be artifactual.   Ct Head Wo Contrast 05/26/2017   IMPRESSION:  Area of acute hemorrhage within the left thalamus measures 1.7 x 1.3 cm. Atrophy, chronic small vessel disease.   ECHO pending   PHYSICAL EXAM Vitals:   05/28/17 0900 05/28/17 1000 05/28/17 1023 05/28/17 1100  BP: (!) 141/91 (!) 177/85 (!) 180/92 (!) 154/92  Pulse: (!) 117 (!) 111  (!) 104  Resp: (!) 23 (!) 22  17  Temp:  TempSrc:      SpO2: 99% 96%  100%  Weight:      Height:        Temp:  [98.5 F (36.9 C)-99.2 F (37.3 C)] 99.2 F (37.3 C) (11/19 0800) Pulse Rate:  [96-121] 104 (11/19 1100) Resp:  [0-39] 17 (11/19 1100) BP: (83-208)/(64-174) 154/92 (11/19 1100) SpO2:  [93 %-100 %] 100 % (11/19 1100)  General - Well nourished, well developed, in no apparent distress.  Ophthalmologic - Fundi not visualized due to noncooperation.  Cardiovascular - Regular and rhythm, but tachycardia.  Mental Status -  Level of arousal and orientation to time, place, and person were intact. Language including expression, repetition, comprehension was assessed and found intact, but mild dysarthria and naming 3/4  Cranial Nerves II - XII - II - Visual field intact OU. III, IV, VI - Extraocular movements intact. V - Facial sensation intact bilaterally. VII - Facial movement intact bilaterally. VIII - Hearing & vestibular intact bilaterally. X - Palate elevates symmetrically,  mild dysarthria due to poor dentation XI - Chin turning & shoulder shrug intact bilaterally. XII - Tongue protrusion intact.  Motor Strength - The patient's strength was normal in all extremities and pronator drift was absent.  Bulk was normal and fasciculations were absent.   Motor Tone - Muscle tone was assessed at the neck and appendages and was normal.  Reflexes - The patient's reflexes were 1+ in all extremities and she had no pathological reflexes.  Sensory - Light touch, temperature/pinprick were assessed and were symmetrical.    Coordination - The patient had normal movements in the hands with no ataxia or dysmetria.  Tremor was absent.  Gait and Station - deferred   ASSESSMENT/PLAN Ms. [name] is a 61 y.o. 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 She did not receive IV t-PA due to hemorrhage.  Left small thalamic hemorrhage:  In the setting of cocaine use, uncontrolled HTN and DKA  Resultant  AMS resolved  CT head - Area of acute hemorrhage within the left thalamus measures 1.7 x 1.3 cm.  Carotid Doppler - 10/2016 negative  2D Echo - pending  LDL - 39  HgbA1c - 14.6  VTE prophylaxis - SCDs DIET DYS 3 Room service appropriate? Yes; Fluid consistency: Thin  aspirin 81 mg daily prior to admission, now on No antithrombotic.  Ongoing aggressive stroke risk factor management  Therapy recommendations:  pending  Disposition:  Pending  Follow up with [facility] in 4 weeks   No new recommendations. Neurology to sign off.  Hx of ICH and stroke  06/2016 - left pontine subacute hemorrhage - MRA neg - EF 60-65% - LDL 181, A1C 12.6, CUS neg - discharged with lipitor  10/2016 - admitted for HONK and encephalopathy - seizure  activity put on keppra - however incidental finding of right BG small infarct - CUS neg - put on ASA and continued lipitor  Dismissed from [facility] due to more than 3 no-shows  Hypertension  Stable  Off cleviprex drip  BP goal < 160  Long-term BP goal normotensive  Hyperlipidemia  Home meds:  Lipitor 40 mg dailyresumed in hospital  LDL 39, goal < 70  Continue statin at discharge  Diabetes  HgbA1c 14.6, goal < 7.0  Uncontrolled  Admitted for DKA  Off insulin  drip  CCM on board  Need close PCP follow up and DM education again  Cocaine abuse  UDS positive again for cocaine  Hx of cocaine use  Cessation counseling provided  Tobacco abuse  Current smoker  Smoking cessation counseling provided  Pt is willing to quit  Other Stroke Risk Factors  ETOH use, advised to drink no more than 1 drink per day.  Family hx stroke (father)  Other Active Problems  Seizure history - Keppra 500 mg BID continued.  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 I have made any additions or clarifications directly to the above note. Pt was seen and examined. No neuro changes over night. Had hypoglycemic event, off insulin drip. Pending speech re-evaluation today due to choking episodes. Mild dysarthria. Again discussed with pt regarding medication compliance, risk factor modification and smoking/cocain cessation. Pt agreed.  Neurology to sign off at this time.  Please call with any further questions or concerns.  Follow up with [facility] in 4 weeks. Thank you for this consultation.  Rosalin Hawking, MD PhD Stroke  Neurology 05/28/2017 2:26 PM       To contact Stroke Continuity provider, please refer to [email or web address] After hours, contact General Neurology

## 2017-05-28 NOTE — Progress Notes (Signed)
[facility] Progress Note Patient Name: [name] DOB: 1956-06-06 MRN: [id number]   Date of Service  05/28/2017  HPI/Events of Note  K+ = 3.2 and Creatinine = 0.97.  eICU Interventions  Will replace K+.     Intervention Category Major Interventions: Electrolyte abnormality - evaluation and management  Sommer,Steven Eugene 05/28/2017, 6:37 AM

## 2017-05-28 NOTE — Evaluation (Signed)
Objective Swallowing Evaluation: Type of Study: MBS-Modified Barium Swallow Study   Patient Details  Name: [name] MRN: [id number] Date of Birth: Jul 26, 1955  Today's Date: 05/28/2017 Time: SLP Start Time (ACUTE ONLY): 1300 -SLP Stop Time (ACUTE ONLY): 1323  SLP Time Calculation (min) (ACUTE ONLY): 23 min   Past Medical History:  Past Medical History:  Diagnosis Date  . Cocaine abuse, continuous ([place])   . Diabetes mellitus    Hb A1C = 12.6 on 05/15/11, managed on Novolog 70/30, 35 U qam, 25 U qpm  . Hypertension    poorly controlled  . Insomnia disorder   . Shortness of breath    Past Surgical History:  Past Surgical History:  Procedure Laterality Date  . ANKLE FRACTURE SURGERY  2007   HPI: [name] is a 61 y.o. female with with a history of CVA, hypertension, cocaine abuse, diabetes who presented with at least a 1 day history of altered mental status.  She came into the ER yesterday at 6 PM, and was found to be in DKA, AK I, leukocytosis, and was felt that these issues were contributing to a mild encephalopathy.  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   Subjective: Pt in bed, fluctuating alertness    Assessment / Plan / Recommendation  CHL IP CLINICAL IMPRESSIONS 05/28/2017  Clinical Impression Pt demonstrates mild oropharyngeal dysphagia marked by flash penetration  and premature spillage to the level of the pyriforms across consistencies. Laryngeal elevation and airway protection timely; however, anterior hyolaryngeal excursion occurred with the bolus sitting in the pyriform sinus. Bony protrusions of the spinal column observed on cervical verterbrae 3 and 4 with no adverse effects on  swallow noted. Esophageal sweep performed with solid and pill consistencies; bolus successfully transited through esophagus with both presentations. Pt demonstrates consistent coughing in room per RN however, no coughing observed during study; decrease in coughing may be due to postural changes. Recommend Dys 3 diet with thin liquids and meds whole in puree. Sign created for [facility] including recommendations and safety precautions; pt to take small bites/sips, sit upright for meals and clear throat intermittently. Will follow-up at bedside x1 for diet tolerance before signing off.   SLP Visit Diagnosis Dysphagia, unspecified (R13.10)  Attention and concentration deficit following --  Frontal lobe and executive function deficit following --  Impact on safety and function Mild aspiration risk      CHL IP TREATMENT RECOMMENDATION 05/28/2017  Treatment Recommendations Therapy as outlined in treatment plan below     Prognosis 05/28/2017  Prognosis for Safe Diet Advancement Good  Barriers to Reach Goals Cognitive deficits  Barriers/Prognosis Comment --    CHL IP DIET RECOMMENDATION 05/28/2017  SLP Diet Recommendations Dysphagia 3 (Mech soft) solids;Thin liquid  Liquid Administration via Cup;Straw  Medication Administration Whole meds with puree  Compensations Slow rate;Small sips/bites;Clear throat intermittently  Postural Changes Seated upright at 90 degrees;Remain semi-upright after after feeds/meals (Comment)      CHL IP OTHER RECOMMENDATIONS 05/28/2017  Recommended Consults --  Oral Care Recommendations Oral care BID  Other Recommendations --      CHL IP FOLLOW UP RECOMMENDATIONS 05/28/2017  Follow up Recommendations None      CHL IP FREQUENCY  AND DURATION 05/28/2017  Speech Therapy Frequency (ACUTE ONLY) min 1 x/week  Treatment Duration 1 week           CHL IP ORAL PHASE 05/28/2017  Oral Phase Impaired  Oral - Pudding Teaspoon --  Oral - Pudding Cup --  Oral - Honey Teaspoon  --  Oral - Honey Cup --  Oral - Nectar Teaspoon --  Oral - Nectar Cup --  Oral - Nectar Straw --  Oral - Thin Teaspoon --  Oral - Thin Cup Premature spillage  Oral - Thin Straw Premature spillage  Oral - Puree Premature spillage  Oral - Mech Soft --  Oral - Regular Premature spillage  Oral - Multi-Consistency --  Oral - Pill Premature spillage;Reduced posterior propulsion  Oral Phase - Comment --    CHL IP PHARYNGEAL PHASE 05/28/2017  Pharyngeal Phase Impaired  Pharyngeal- Pudding Teaspoon --  Pharyngeal --  Pharyngeal- Pudding Cup --  Pharyngeal --  Pharyngeal- Honey Teaspoon --  Pharyngeal --  Pharyngeal- Honey Cup --  Pharyngeal --  Pharyngeal- Nectar Teaspoon --  Pharyngeal --  Pharyngeal- Nectar Cup --  Pharyngeal --  Pharyngeal- Nectar Straw --  Pharyngeal --  Pharyngeal- Thin Teaspoon --  Pharyngeal --  Pharyngeal- Thin Cup Penetration/Aspiration during swallow  Pharyngeal Material enters airway, remains ABOVE vocal cords then ejected out  Pharyngeal- Thin Straw Penetration/Aspiration during swallow  Pharyngeal Material enters airway, remains ABOVE vocal cords then ejected out  Pharyngeal- Puree Penetration/Aspiration during swallow  Pharyngeal Material enters airway, remains ABOVE vocal cords then ejected out  Pharyngeal- Mechanical Soft --  Pharyngeal --  Pharyngeal- Regular Penetration/Aspiration during swallow  Pharyngeal Material enters airway, remains ABOVE vocal cords then ejected out  Pharyngeal- Multi-consistency --  Pharyngeal --  Pharyngeal- Pill Penetration/Aspiration during swallow  Pharyngeal Material enters airway, remains ABOVE vocal cords then ejected out  Pharyngeal Comment --     CHL IP CERVICAL ESOPHAGEAL PHASE 05/28/2017  Cervical Esophageal Phase WFL  Pudding Teaspoon --  Pudding Cup --  Honey Teaspoon --  Honey Cup --  Nectar Teaspoon --  Nectar Cup --  Nectar Straw --  Thin Teaspoon --  Thin Cup --  Thin Straw --  Puree --   Mechanical Soft --  Regular --  Multi-consistency --  Pill --  Cervical Esophageal Comment --    No flowsheet data found.  Aaron Edelman, Student SLP 05/28/2017, 1:58 PM

## 2017-05-28 NOTE — Consult Note (Addendum)
[facility] Nurse wound consult note Reason for Consult: Consult requested for buttocks, groin and abd folds, perineum, and right leg wound. Wound type: Buttocks red and macerated with patchy areas of partial thickness skin loss; beginning to peel around the affected areas.  Appearance is consistent with moisture associated skin damage.  Inner groin/perineum/abd folds with the same appearance; partial thickness fissures related to moisture in the abd and groin folds; red and moist.   Right leg with full thickness necrotic ulcer; patient admits to using crack cocaine.  If she develops further necrotic areas in the future, then consider it might be related to possible Levamisole skin necrosis. This would need to be diagnosed with a biopsy of the location. Pt states this wound developed quickly. Measurement: Right calf full thickness wound; 5X3cm, 80% slough/eschar, 20% red, mod amt tan drainage, no odor. Dressing procedure/placement/frequency: Pt is already receiving antifungal cream to affected areas of MASD.  Apply antifungal powder on top of this to encourage drying and healing.  Santyl ointment to provide enzymatic debridement of nonviable tissue to right leg. Pt is on an air mattress to increase airflow and reduce pressure to the affected areas. Discussed plan of care with patient and she verbalized understanding. Please re-consult if further assistance is needed.  Thank-you,  Julien Girt MSN, [place], [place], [place], [place]

## 2017-05-28 NOTE — Clinical Social Work Note (Signed)
Clinical Social Work Assessment  Patient Details  Name: Marcella Charlson MRN: [id number] Date of Birth: 23-Jan-1956  Date of referral:  05/28/17               Reason for consult:  Facility Placement                Permission sought to share information with:  Facility Art therapist granted to share information::  Yes, Verbal Permission Granted  Name::        Agency::  SNF  Relationship::     Contact Information:     Housing/Transportation Living arrangements for the past 2 months:  Single Family Home Source of Information:  Patient Patient Interpreter Needed:  None Criminal Activity/Legal Involvement Pertinent to Current Situation/Hospitalization:  No - Comment as needed Significant Relationships:  Adult Children, Significant Other Lives with:  Significant Other, Friends Do you feel safe going back to the place where you live?  No Need for family participation in patient care:  Yes (Comment)(assistance with mobility at home)  Care giving concerns:  Concerns from staff for pt wellbeing at home- pt has widespread moisture associated skin breakdown on buttocks from being incontinent.  Per patient she lives at home with disabled boyfriend and a "girlfriend" states her dtr also sometimes lives with her and helps her with needs but not consistently.  Pt states most days she does not move around much or leave the house.  Pt also states that she has an RN come in sometimes to help with things like bathing. Now pt with weakness, skin breakdown, and possible need for IV antibiotics and no consistent help at home.   Social Worker assessment / plan:  CSW spoke with pt concerning PT recommendations for SNF.  CSW explained SNF and SNF referral process.  Pt was recently at [facility] rehab and felt as if that was helpful.  CSW explained that SNF would be less intensive but that they could offer PT/OT and would be able to assist with butt wounds which are currently causing patient great deal  of pain.  Employment status:  Disabled (Comment on whether or not currently receiving Disability) Insurance information:  Medicaid [place] PT Recommendations:  [place] / Referral to community resources:  [place]  Patient/Family's Response to care:  Pt originally a little hesitant about SNF stay but after further discussion realizes she is not going to improve at home in current situation.  Patient/Family's Understanding of and Emotional Response to Diagnosis, Current Treatment, and Prognosis:  Pt seemed to have good understanding of current needs if not exhibiting much motivation to address them- hopeful that SNF stay will improve her butt wounds and increase her comfort at home.  Emotional Assessment Appearance:  Appears stated age Attitude/Demeanor/Rapport:    Affect (typically observed):  Appropriate Orientation:  Oriented to Situation, Oriented to  Time, Oriented to Place, Oriented to Self Alcohol / Substance use:  Illicit Drugs Psych involvement (Current and /or in the community):  No (Comment)  Discharge Needs  Concerns to be addressed:  Care Coordination Readmission within the last 30 days:  Yes Current discharge risk:  Physical Impairment Barriers to Discharge:  Continued Medical Work up   [name], LCSW 05/28/2017, 3:09 PM

## 2017-05-28 NOTE — NC FL2 (Signed)
[place] MEDICAID FL2 LEVEL OF CARE SCREENING TOOL     IDENTIFICATION  Patient Name: [name] Birthdate: 09-16-1955 Sex: female Admission Date (Current Location): 05/25/2017  [facility] and [place] Number:  Herbalist and Address:  [facility] [place], [place] [address], [place], [place] [address]      Provider Number: [id number]  Attending Physician Name and Address:  Raylene Miyamoto, MD  Relative Name and Phone Number:       Current Level of Care: Hospital Recommended Level of Care: [place] Prior Approval Number:    Date Approved/Denied:   PASRR Number: [id number]  Discharge Plan: SNF    Current Diagnoses: Patient Active Problem List   Diagnosis Date Noted  . Acute metabolic encephalopathy 53/61/4431  . Intracranial bleed ([place]) 05/26/2017  . ICH (intracerebral hemorrhage) ([place]) 05/26/2017  . Type II diabetes mellitus with renal manifestations ([place]) 05/25/2017  . SIRS (systemic inflammatory response syndrome) ([place]) 05/25/2017  . Aspiration pneumonia due to gastric secretions ([place])   . Confusion 05/10/2017  . Benign essential HTN   . Chronic hepatitis C without hepatic coma ([place])   . Labile blood glucose   . Hypoglycemia associated with type 2 diabetes mellitus ([place])   . Poorly controlled type 2 diabetes mellitus with peripheral neuropathy ([place])   . Neurologic gait disorder   . Neuropathic pain   . Type 2 diabetes mellitus with peripheral neuropathy (HCC)   . History of intracranial hemorrhage   . Diarrhea   . Urinary incontinence   . Hypokalemia   . Polysubstance abuse ([place])   . Right sided cerebral hemisphere cerebrovascular accident (CVA) ([place])   . Disorientation   . Meningitis 11/02/2016  . Type 2 diabetes mellitus with hyperosmolar nonketotic hyperglycemia ([place]) 10/31/2016  . Seizure ([place]) 10/31/2016  . ARF (acute renal failure) ([place]) 10/31/2016  . Severe sepsis ([place]) 10/31/2016  . DKA (diabetic  ketoacidoses) ([place]) 10/31/2016  . Acute renal failure superimposed on stage 3 chronic kidney disease ([place])   . Right hemiparesis ([place]) 06/09/2016  . 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 Cytotoxic brain edema ([place]) 06/08/2016  . Encephalopathy acute 06/06/2016  . Chest pain, musculoskeletal 05/16/2011  . Vaginal candidiasis 05/16/2011  . Diabetes mellitus, type 2 ([place]) 05/16/2011  . CHEST PAIN 10/01/2009  . CELLULITIS AND ABSCESS OF LEG EXCEPT FOOT 08/31/2009  . INSOMNIA 07/13/2008  . MRSA 04/17/2008  . NECK MASS 04/17/2008  . COCAINE ABUSE 11/01/2007  . OBESITY NOS 07/14/2006  . DENTAL CARIES 07/14/2006  . DIABETES MELLITUS, TYPE II 04/26/2006  . Alcohol abuse 04/26/2006  . TOBACCO ABUSE 04/26/2006  . DEPRESSION 04/26/2006  . Essential hypertension 04/26/2006  . GERD 04/26/2006  . LOW BACK PAIN 04/26/2006    Orientation RESPIRATION BLADDER Height & Weight     Self, Time, Situation, Place  Normal Incontinent, External catheter Weight: 191 lb 2.2 oz (86.7 kg) Height:  5\' 8"  (172.7 cm)  BEHAVIORAL SYMPTOMS/MOOD NEUROLOGICAL BOWEL NUTRITION STATUS      Continent Diet(carb modified/mechanical soft)  AMBULATORY STATUS COMMUNICATION OF NEEDS Skin   Extensive Assist Verbally Other (Comment)(moisture associated skin breakdown on buttocks/upper legs- requires gehearts butt cream/ barrier cream every time cleaned up)                       Personal Care Assistance Level of Assistance  Bathing, Dressing Bathing Assistance: Maximum assistance   Dressing Assistance: Maximum assistance     Functional Limitations Info  SPECIAL CARE FACTORS FREQUENCY  PT (By licensed PT), OT (By licensed OT)     PT Frequency: 5/wk OT Frequency: 5/wk            Contractures      Additional Factors Info  Code Status, Allergies, Insulin Sliding Scale, Isolation Precautions Code Status Info: FULL Allergies Info: NKA   Insulin Sliding Scale Info: 8/day Isolation  Precautions Info: MRSA      Current Medications (05/28/2017):  This is the current hospital active medication list Current Facility-Administered Medications  Medication Dose Route Frequency Provider Last Rate Last Dose  . acetaminophen (TYLENOL) tablet 650 mg  650 mg Oral Q6H PRN Ivor Costa, MD      . albuterol (PROVENTIL) (2.5 MG/3ML) 0.083% nebulizer solution 3 mL  3 mL Inhalation QID PRN Ivor Costa, MD      . amLODipine (NORVASC) tablet 10 mg  10 mg Oral Daily Ivor Costa, MD   10 mg at 05/28/17 1023  . Chlorhexidine Gluconate Cloth 2 % PADS 6 each  6 each Topical Daily Raylene Miyamoto, MD   6 each at 05/28/17 1100  . ciprofloxacin (CIPRO) IVPB 400 mg  400 mg Intravenous Q12H Rumbarger, Valeda Malm, RPH   Stopped at 05/28/17 1115  . clevidipine (CLEVIPREX) infusion 0.5 mg/mL  0-21 mg/hr Intravenous Continuous Rosalin Hawking, MD   Stopped at 05/28/17 0000  . collagenase (SANTYL) ointment   Topical Daily Raylene Miyamoto, MD      . cyclobenzaprine (FLEXERIL) tablet 10 mg  10 mg Oral Gordan Payment, MD   10 mg at 05/27/17 2102  . dextrose 5 %-0.45 % sodium chloride infusion   Intravenous Continuous Anders Simmonds, MD 50 mL/hr at 05/28/17 1400    . fluconazole (DIFLUCAN) tablet 100 mg  100 mg Oral Daily Rigoberto Noel, MD   100 mg at 05/28/17 1100  . folic acid (FOLVITE) tablet 1 mg  1 mg Oral Daily Ivor Costa, MD   1 mg at 05/28/17 1021  . gabapentin (NEURONTIN) capsule 300 mg  300 mg Oral TID Ivor Costa, MD   300 mg at 05/28/17 1025  . Gerhardt's butt cream   Topical QID Raylene Miyamoto, MD   1 application at 21/19/41 1121  . hydrALAZINE (APRESOLINE) injection 5-10 mg  5-10 mg Intravenous Q2H PRN Hammonds, Sharyn Blitz, MD      . insulin aspart (novoLOG) injection 0-15 Units  0-15 Units Subcutaneous Q4H Rigoberto Noel, MD   8 Units at 05/28/17 1146  . insulin aspart protamine- aspart (NOVOLOG MIX 70/30) injection 15 Units  15 Units Subcutaneous BID WC Corey Harold, NP      .  levETIRAcetam (KEPPRA) tablet 500 mg  500 mg Oral BID Ivor Costa, MD   500 mg at 05/28/17 1021  . LORazepam (ATIVAN) tablet 1 mg  1 mg Oral Q6H PRN Ivor Costa, MD       Or  . LORazepam (ATIVAN) injection 1 mg  1 mg Intravenous Q6H PRN Ivor Costa, MD      . MEDLINE mouth rinse  15 mL Mouth Rinse BID Hammonds, Sharyn Blitz, MD   15 mL at 05/28/17 1015  . multivitamin with minerals tablet 1 tablet  1 tablet Oral Daily Ivor Costa, MD   1 tablet at 05/28/17 1021  . mupirocin ointment (BACTROBAN) 2 % 1 application  1 application Nasal BID Hammonds, Sharyn Blitz, MD   1 application at 74/08/14 [phone]  . nystatin cream (MYCOSTATIN)  Topical BID Raylene Miyamoto, MD      . ondansetron [facility]) injection 4 mg  4 mg Intravenous Q8H PRN Ivor Costa, MD      . oxybutynin (DITROPAN-XL) 24 hr tablet 5 mg  5 mg Oral QHS Ivor Costa, MD   5 mg at 05/27/17 2101  . pantoprazole (PROTONIX) injection 40 mg  40 mg Intravenous QHS Greta Doom, MD   40 mg at 05/27/17 2102  . potassium PHOSPHATE 30 mmol in dextrose 5 % 500 mL infusion  30 mmol Intravenous Once Corey Harold, NP 85 mL/hr at 05/28/17 1102 30 mmol at 05/28/17 1102  . senna-docusate (Senokot-S) tablet 1 tablet  1 tablet Oral BID Greta Doom, MD   1 tablet at 05/28/17 1025  . sodium chloride flush (NS) 0.9 % injection 10-40 mL  10-40 mL Intracatheter PRN Raylene Miyamoto, MD   10 mL at 05/28/17 0953  . thiamine (VITAMIN B-1) tablet 100 mg  100 mg Oral Daily Ivor Costa, MD   100 mg at 05/28/17 1021  . vancomycin (VANCOCIN) 1,500 mg in sodium chloride 0.9 % 500 mL IVPB  1,500 mg Intravenous Q24H Rumbarger, Valeda Malm, RPH   Stopped at 05/28/17 1330  . zolpidem (AMBIEN) tablet 5 mg  5 mg Oral QHS PRN Ivor Costa, MD         Discharge Medications: Please see discharge summary for a list of discharge medications.  Relevant Imaging Results:  Relevant Lab Results:   Additional Information SSN: [id number]; likely requiring IV antibiotics  at DC- unsure which one or how long will be needed yet  Xena Propst, Connye Burkitt, LCSW

## 2017-05-29 ENCOUNTER — Inpatient Hospital Stay (HOSPITAL_COMMUNITY): Payer: Medicaid Other

## 2017-05-29 DIAGNOSIS — I5030 Unspecified diastolic (congestive) heart failure: Secondary | ICD-10-CM

## 2017-05-29 LAB — BASIC METABOLIC PANEL
Anion gap: 6 (ref 5–15)
BUN: 6 mg/dL (ref 6–20)
CALCIUM: 7.9 mg/dL — AB (ref 8.9–10.3)
CHLORIDE: 114 mmol/L — AB (ref 101–111)
CO2: 19 mmol/L — ABNORMAL LOW (ref 22–32)
CREATININE: 1.1 mg/dL — AB (ref 0.44–1.00)
GFR, EST NON AFRICAN AMERICAN: 53 mL/min — AB (ref >60)
Glucose, Bld: 113 mg/dL — ABNORMAL HIGH (ref 65–99)
Potassium: 3.2 mmol/L — ABNORMAL LOW (ref 3.5–5.1)
SODIUM: 139 mmol/L (ref 135–145)

## 2017-05-29 LAB — GLUCOSE, CAPILLARY
GLUCOSE-CAPILLARY: 105 mg/dL — AB (ref 65–99)
GLUCOSE-CAPILLARY: 189 mg/dL — AB (ref 65–99)
Glucose-Capillary: 258 mg/dL — ABNORMAL HIGH (ref 65–99)
Glucose-Capillary: 210 mg/dL — ABNORMAL HIGH (ref 65–99)
Glucose-Capillary: 195 mg/dL — ABNORMAL HIGH (ref 65–99)
Glucose-Capillary: 209 mg/dL — ABNORMAL HIGH (ref 65–99)

## 2017-05-29 LAB — ECHOCARDIOGRAM COMPLETE
Height: 68 in
Weight: 3353.6 oz

## 2017-05-29 MED ORDER — PANTOPRAZOLE SODIUM 40 MG PO TBEC
40.0000 mg | DELAYED_RELEASE_TABLET | Freq: Every day | ORAL | Status: DC
  Administered 2017-05-29 – 2017-06-03 (×6): 40 mg via ORAL
  Filled 2017-05-29 (×6): qty 1

## 2017-05-29 MED ORDER — INSULIN ASPART 100 UNIT/ML ~~LOC~~ SOLN
0.0000 [IU] | Freq: Every day | SUBCUTANEOUS | Status: DC
  Administered 2017-05-29: 2 [IU] via SUBCUTANEOUS
  Administered 2017-05-31: 3 [IU] via SUBCUTANEOUS
  Administered 2017-06-01 – 2017-06-03 (×2): 2 [IU] via SUBCUTANEOUS

## 2017-05-29 MED ORDER — INSULIN ASPART 100 UNIT/ML ~~LOC~~ SOLN
0.0000 [IU] | Freq: Three times a day (TID) | SUBCUTANEOUS | Status: DC
  Administered 2017-05-29: 3 [IU] via SUBCUTANEOUS
  Administered 2017-05-29 – 2017-05-30 (×2): 5 [IU] via SUBCUTANEOUS
  Administered 2017-05-30: 2 [IU] via SUBCUTANEOUS
  Administered 2017-05-30 – 2017-05-31 (×2): 5 [IU] via SUBCUTANEOUS
  Administered 2017-05-31: 8 [IU] via SUBCUTANEOUS
  Administered 2017-05-31: 11 [IU] via SUBCUTANEOUS
  Administered 2017-06-01 (×3): 3 [IU] via SUBCUTANEOUS
  Administered 2017-06-02: 11 [IU] via SUBCUTANEOUS
  Administered 2017-06-02: 5 [IU] via SUBCUTANEOUS
  Administered 2017-06-03: 11 [IU] via SUBCUTANEOUS
  Administered 2017-06-03: 3 [IU] via SUBCUTANEOUS
  Administered 2017-06-04: 5 [IU] via SUBCUTANEOUS
  Administered 2017-06-04: 8 [IU] via SUBCUTANEOUS

## 2017-05-29 MED ORDER — MAGNESIUM SULFATE 2 GM/50ML IV SOLN
2.0000 g | Freq: Once | INTRAVENOUS | Status: AC
  Administered 2017-05-29: 2 g via INTRAVENOUS
  Filled 2017-05-29: qty 50

## 2017-05-29 NOTE — Progress Notes (Signed)
[facility] Progress Note  Patient: [name] OIZ:[id number]   PCP: Nolene Ebbs, MD DOB: [birth year]   DOA: 05/25/2017   DOS: 05/29/2017   Date of Service: the patient was seen and examined on 05/29/2017  Subjective: pt not following commands, non verbal at time of my eval.   Brief hospital course: Pt. 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 Currently further plan is continue Antibiotics.  Assessment and Plan: 1. Acute Thalamic Hemorrhage: possibly due to cocaine abuse in setting of HTN emergency Cocaine abuse Hx CVA, Seizure, ETOH   - appreciate Neurology consult, initially was on antiplatelet medication currently on none. - continue home dose of Keppra - monitor CIWA; give benzos PRN - start folate, thiamine, MVI Neurology currently signed off, outpatient follow-up with [place] neurology since patient is discharged from [facility] neurology.  2. DKA:  Confirmed by high BHB > AG closed 11/18 Anion gap is closed  Continue 7030 insulin with sliding scale.  3. Hx Tobacco abuse: - no nicotine patch now per Neuro  4. HTN Emergency;  With hx HTN: Chronic diastolic CHF P: - Cleviprex off - holding home labetalol in setting of cocaine abuse - hold lisinopril due to AKI - Well controlled on Norvasc 10mg  - SBP goal 140-160   5. PJA:[id number] 2.59 on admission, up from baseline of 0.96; now  improved  Patient had a Foley catheter now removed.  6. Multiple Excoriations and Ulcerations with questionable secondary bacterial infection: - excoriations on panus and perineum area with multiple small pustules in periphery; question bacterial super-infection. - Nystatin cream PRN, add diflucan 100 mg daily x 5ds - sacral ulcer and  unstageable calf ulcer.    - wound care consult.  - empiric ABX given tenderness around leg wound Discontinue Cipro, continue vancomycin for now   Diet: dys 3. DVT Prophylaxis: mechanical compression device  Advance goals of care discussion: full code  Family Communication: no family was present at bedside, at the time of interview.  Disposition:  Discharge to SNF.  Consultants: neurology, primary PCCM  Procedures: Echocardiogram   Antibiotics: Anti-infectives (From admission, onward)   Start     Dose/Rate Route Frequency Ordered Stop   05/27/17 1100  ciprofloxacin (CIPRO) IVPB 400 mg  Status:  Discontinued     400 mg 200 mL/hr over 60 Minutes Intravenous Every 12 hours 05/27/17 1004 05/29/17 0801   05/27/17 1100  vancomycin (VANCOCIN) 1,500 mg in sodium chloride 0.9 % 500 mL IVPB     1,500 mg 250 mL/hr over 120 Minutes Intravenous Every 24 hours 05/27/17 1004     05/27/17 1000  fluconazole (DIFLUCAN) tablet 100 mg     100 mg Oral Daily 05/27/17 0956 06/01/17 0959       Objective: Physical Exam: Vitals:   05/29/17 0608 05/29/17 0837 05/29/17 1509 05/29/17 2127  BP: (!) 160/82 (!) 158/81 138/83 (!) 176/81  Pulse: (!) 107 (!) 113 (!) 103 (!) 108  Resp:  20 20 20   Temp:  99 F (37.2 C) 98.9 F (37.2 C) 99.6 F (37.6 C)  TempSrc:  Oral Oral Oral  SpO2: 100% 98% 95% 98%  Weight:      Height:        Intake/Output Summary (Last 24 hours) at 05/29/2017 2353 Last data filed at 05/29/2017 2128 Gross  per 24 hour  Intake 358 ml  Output 1000 ml  Net -642 ml   Filed Weights   05/25/17 1848 05/26/17 0900 05/28/17 1614  Weight: 98.9 kg (218 lb) 86.7 kg (191 lb 2.2 oz) 95.1 kg (209 lb 9.6 oz)   General: Alert, Awake and Oriented to Person. Appear in mild distress, affect flat Eyes: PERRL, Conjunctiva normal ENT: Oral Mucosa clear moist. Neck: difficult to assess JVD, no Abnormal Mass Or lumps Cardiovascular: S1 and S2 Present, no Murmur, Peripheral Pulses  Present Respiratory: normal respiratory effort, Bilateral Air entry equal and Decreased, no use of accessory muscle, faint basal Crackles, no wheezes Abdomen: Bowel Sound present, Soft and no tenderness, no hernia Skin bilateral skin ulcers Extremities: trace Pedal edema, no calf tenderness Neurologic: Dysarthria generalized weakness bilaterally Data Reviewed: CBC: Recent Labs  Lab 05/25/17 1955 05/26/17 0339 05/28/17 0501  WBC 15.0* 14.5* 9.7  NEUTROABS 12.4*  --   --   HGB 11.5* 12.3 10.7*  HCT 35.6* 37.3 32.9*  MCV 95.4 93.3 91.1  PLT 309 294 578   Basic Metabolic Panel: Recent Labs  Lab 05/26/17 0900  05/27/17 0339 05/27/17 1725 05/28/17 0501 05/28/17 1416 05/29/17 0227  NA  --    < > 148*  148* 144 139 135 139  K  --    < > 3.2*  3.2* 3.0* 3.2* 5.5* 3.2*  CL  --    < > 118*  118* 118* 114* 108 114*  CO2  --    < > 21*  21* 22 20* 19* 19*  GLUCOSE  --    < > 212*  212* 74 61* 349* 113*  BUN  --    < > 13  13 8  5* 5* 6  CREATININE  --    < > 1.20*  1.20* 1.00 0.97 1.19* 1.10*  CALCIUM  --    < > 8.5*  8.5* 8.5* 7.8* 7.8* 7.9*  MG 2.0  --   --   --  1.2*  --   --   PHOS 1.9*  --   --   --  1.3*  --   --    < > = values in this interval not displayed.    Liver Function Tests: Recent Labs  Lab 05/25/17 1955  AST 12*  ALT 9*  ALKPHOS 58  BILITOT 2.0*  PROT 7.9  ALBUMIN 2.7*   Recent Labs  Lab 05/25/17 1955  LIPASE 75*   Recent Labs  Lab 05/25/17 1955  AMMONIA 28   Coagulation Profile: Recent Labs  Lab 05/25/17 1955  INR 1.28   Cardiac Enzymes: No results for input(s): CKTOTAL, CKMB, CKMBINDEX, TROPONINI in the last 168 hours. BNP (last 3 results) No results for input(s): PROBNP in the last 8760 hours. CBG: Recent Labs  Lab 05/29/17 0425 05/29/17 0803 05/29/17 1118 05/29/17 1648 05/29/17 1957  GLUCAP 105* 189* 210* 195* 209*   Studies: No results found.  Scheduled Meds: . amLODipine  10 mg Oral Daily  . Chlorhexidine  Gluconate Cloth  6 each Topical Daily  . collagenase   Topical Daily  . cyclobenzaprine  10 mg Oral QHS  . fluconazole  100 mg Oral Daily  . folic acid  1 mg Oral Daily  . gabapentin  300 mg Oral TID  . Gerhardt's butt cream   Topical QID  . insulin aspart  0-15 Units Subcutaneous TID WC  . insulin aspart  0-5 Units Subcutaneous QHS  . insulin aspart protamine-  aspart  15 Units Subcutaneous BID WC  . levETIRAcetam  500 mg Oral BID  . mouth rinse  15 mL Mouth Rinse BID  . multivitamin with minerals  1 tablet Oral Daily  . mupirocin ointment  1 application Nasal BID  . nystatin cream   Topical BID  . oxybutynin  5 mg Oral QHS  . pantoprazole  40 mg Oral QHS  . senna-docusate  1 tablet Oral BID  . thiamine  100 mg Oral Daily   Continuous Infusions: . clevidipine Stopped (05/28/17 0000)  . vancomycin Stopped (05/29/17 1355)   PRN Meds: acetaminophen, albuterol, hydrALAZINE, ondansetron (ZOFRAN) IV, sodium chloride flush, zolpidem  Time spent: 35 minutes  Author: Berle Mull, MD [facility] Pager: [phone] 05/29/2017 11:53 PM  If 7PM-7AM, please contact night-coverage at [email or web address], password [facility]

## 2017-05-29 NOTE — Progress Notes (Signed)
Inpatient Diabetes Program Recommendations  AACE/ADA: New Consensus Statement on Inpatient Glycemic Control (2015)  Target Ranges:  Prepandial:   less than 140 mg/dL      Peak postprandial:   less than 180 mg/dL (1-2 hours)      Critically ill patients:  140 - 180 mg/dL   Lab Results  Component Value Date   GLUCAP 189 (H) 05/29/2017   HGBA1C 14.6 (H) 05/26/2017    Review of Glycemic Control  Results for OTHA, RICKLES (MRN [id number]) as of 05/29/2017 09:37  Ref. Range 05/28/2017 16:12 05/28/2017 20:24 05/28/2017 23:50 05/29/2017 04:25 05/29/2017 08:03  Glucose-Capillary Latest Ref Range: 65 - 99 mg/dL 399 (H) 261 (H) 265 (H) 105 (H) 189 (H)   Diabetes history: Type 2 Outpatient Diabetes medications: 70/30 30 units BID  Current orders for Inpatient glycemic control: 70/30 15 units BID, Novolog 0-15 units tid and Novolog 0-5 units qhs.   Inpatient Diabetes Program Recommendations:  Noted that blood sugars have been up and down, less than 70 mg/dl.  HgbA1C is 14.6%. High blood sugar yesterday because a.m. Novolog 70/30 was not given.  Consider d/c 70/30 bid and changing insulin to Lantus 15 units daily while in the hospital and while patient's appetite labile.   Continue Novolog correction (0-9 units tid and Novolog 0-5units qhs) as ordered.  Add Novolog 4 units tid (hold if patient eats less than 50%).  Gentry Fitz, RN, BA, MHA, CDE Diabetes Coordinator Inpatient Diabetes Program  [phone] (Team Pager) [phone] ([place]) 05/29/2017 9:41 AM

## 2017-05-29 NOTE — Progress Notes (Signed)
Physical Therapy Treatment Patient Details Name: [name] MRN: [id number] DOB: January 26, 1956 Today's Date: 05/29/2017    History of Present Illness 61 y.o. female with with a history of CVA, hypertension, cocaine abuse, diabetes who presented with at least a 1 day history of altered mental status.  She came into the ER yesterday at 6 PM, and was found to be in DKA, AK I, leukocytosis, and was felt that these issues were contributing to a mild encephalopathy.  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    PT Comments    Pt's treatment limited by pain. Pt's buttocks showing skin breakdown and sheering. Pt able to roll L and R with mod A +2 for peri care. RN arrived during treatment to apply ointment to skin. Pt refused to sit EOB secondary to pain. She is very fearful of any movement at this time. Will continue to follow to maximize functional independence and safety with mobility.    Follow Up Recommendations  SNF     Equipment Recommendations  None recommended by PT    Recommendations for Other Services       Precautions / Restrictions Precautions Precautions: Fall Restrictions Weight Bearing Restrictions: No    Mobility  Bed Mobility Overal bed mobility: Needs Assistance Bed Mobility: Rolling Rolling: Mod assist;+2 for physical assistance         General bed mobility comments: Mod A +2 to roll L and R for peri care for for RN to apply cream to pt's buttocks.  Transfers                 General transfer comment: pt refused  Ambulation/Gait                 Stairs            Wheelchair Mobility    Modified Rankin (Stroke Patients Only)       Balance Overall balance assessment: Needs assistance                                          Cognition Arousal/Alertness: Awake/alert Behavior During Therapy: Flat affect Overall Cognitive  Status: No family/caregiver present to determine baseline cognitive functioning                         Following Commands: Follows one step commands consistently;Follows one step commands with increased time              Exercises      General Comments        Pertinent Vitals/Pain Pain Assessment: Faces Faces Pain Scale: Hurts whole lot Pain Location: back side Pain Descriptors / Indicators: Tender;Grimacing;Sore Pain Intervention(s): Monitored during session;Limited activity within patient's tolerance;Repositioned    Home Living                      Prior Function            PT Goals (current goals can now be found in the care plan section) Acute Rehab PT Goals Patient Stated Goal: none stated PT Goal Formulation: With patient Time For Goal Achievement: 05/28/17 Potential to Achieve Goals: Fair Progress towards PT goals: Not progressing toward goals - comment(limited by pain)    Frequency    Min  2X/week      PT Plan Current plan remains appropriate    Co-evaluation              AM-PAC PT "6 Clicks" Daily Activity  Outcome Measure  Difficulty turning over in bed (including adjusting bedclothes, sheets and blankets)?: Unable Difficulty moving from lying on back to sitting on the side of the bed? : Unable Difficulty sitting down on and standing up from a chair with arms (e.g., wheelchair, bedside commode, etc,.)?: Unable Help needed moving to and from a bed to chair (including a wheelchair)?: A Lot Help needed walking in hospital room?: A Lot Help needed climbing 3-5 steps with a railing? : Total 6 Click Score: 8    End of Session   Activity Tolerance: Patient limited by pain Patient left: with call bell/phone within reach;in bed;with bed alarm set Nurse Communication: Mobility status PT Visit Diagnosis: Difficulty in walking, not elsewhere classified (R26.2)     Time: 8022-3361 PT Time Calculation (min) (ACUTE ONLY): 20  min  Charges:  $Therapeutic Activity: 8-22 mins                    G Codes:       Benjiman Core, [place] Pager [phone] Acute Rehab    Allena Katz 05/29/2017, 10:10 AM

## 2017-05-29 NOTE — Progress Notes (Signed)
  Echocardiogram 2D Echocardiogram has been performed.  [name] 05/29/2017, 11:50 AM

## 2017-05-29 NOTE — Progress Notes (Signed)
  Speech Language Pathology Treatment: Dysphagia;Cognitive-Linquistic  Patient Details Name: [name] MRN: [id number] DOB: [birth year] Today's Date: 05/29/2017 Time: 6301-6010 SLP Time Calculation (min) (ACUTE ONLY): 19 min  Assessment / Plan / Recommendation Clinical Impression  Pt seen at bedside for SLP intervention targeting cognition and dysphagia management. Per pt report, she experienced difficulty swallowing ground sausage with a.m. meal; no complaints of coughing with liquids. Pt observed with thin liquid via straw and Dys 3 consistencies; no s/sx of aspiration or oral phase complications noted. While in the room, observed pt during phone conversation with family. Using skilled clinical observation, SLP noted adequate sustained attention, appropriate use of pragmatic skills and long-term memory recall. Pt exhibits mild cognitive deficits including slow processing with responses and difficulty recalling novel information; however, SLP suspects deficits are typical for pt's baseline level of functioning. Pt verbalized numerous times that she "just wants to sleep" and stated her desire to be discharged from the hospital; decreased particpation during session. No further acute SLP intervention is needed, will sign off.      HPI HPI: [name] is a 61 y.o. female with with a history of CVA, hypertension, cocaine abuse, diabetes who presented with at least a 1 day history of altered mental status.  She came into the ER yesterday at 6 PM, and was found to be in DKA, AK I, leukocytosis, and was felt that these issues were contributing to a mild encephalopathy.  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      SLP  Plan  Discharge SLP treatment due to (comment)(Pt at baseline functioning)       Recommendations  Diet recommendations: Dysphagia 3 (mechanical soft);Thin liquid Liquids provided via: Cup;Straw Medication Administration: Whole meds with puree Supervision: Patient able to self feed Compensations: Slow rate;Small sips/bites;Clear throat intermittently Postural Changes and/or Swallow Maneuvers: Seated upright 90 degrees;Upright 30-60 min after meal                Oral Care Recommendations: Oral care BID Follow up Recommendations: None SLP Visit Diagnosis: Dysphagia, unspecified (R13.10);Frontal lobe and executive function deficit Frontal lobe and executive function deficit following: Cerebral infarction Plan: Discharge SLP treatment due to (comment)(Pt at baseline functioning)       [place], Student SLP 05/29/2017, 11:15 AM

## 2017-05-30 LAB — BASIC METABOLIC PANEL
Anion gap: 7 (ref 5–15)
BUN: 6 mg/dL (ref 6–20)
CHLORIDE: 110 mmol/L (ref 101–111)
CO2: 21 mmol/L — AB (ref 22–32)
CREATININE: 1.12 mg/dL — AB (ref 0.44–1.00)
Calcium: 8.1 mg/dL — ABNORMAL LOW (ref 8.9–10.3)
GFR calc non Af Amer: 52 mL/min — ABNORMAL LOW (ref >60)
Glucose, Bld: 208 mg/dL — ABNORMAL HIGH (ref 65–99)
Potassium: 3.4 mmol/L — ABNORMAL LOW (ref 3.5–5.1)
Sodium: 138 mmol/L (ref 135–145)

## 2017-05-30 LAB — CBC
HCT: 32.2 % — ABNORMAL LOW (ref 36.0–46.0)
HEMOGLOBIN: 10.7 g/dL — AB (ref 12.0–15.0)
MCH: 30.2 pg (ref 26.0–34.0)
MCHC: 33.2 g/dL (ref 30.0–36.0)
MCV: 91 fL (ref 78.0–100.0)
PLATELETS: 209 10*3/uL (ref 150–400)
RBC: 3.54 MIL/uL — ABNORMAL LOW (ref 3.87–5.11)
RDW: 13 % (ref 11.5–15.5)
WBC: 9 10*3/uL (ref 4.0–10.5)

## 2017-05-30 LAB — GLUCOSE, CAPILLARY
GLUCOSE-CAPILLARY: 145 mg/dL — AB (ref 65–99)
Glucose-Capillary: 240 mg/dL — ABNORMAL HIGH (ref 65–99)
Glucose-Capillary: 249 mg/dL — ABNORMAL HIGH (ref 65–99)
Glucose-Capillary: 188 mg/dL — ABNORMAL HIGH (ref 65–99)

## 2017-05-30 LAB — MAGNESIUM: Magnesium: 1.7 mg/dL (ref 1.7–2.4)

## 2017-05-30 LAB — CULTURE, BLOOD (ROUTINE X 2)
Culture: NO GROWTH
Special Requests: ADEQUATE

## 2017-05-30 MED ORDER — POTASSIUM CHLORIDE CRYS ER 20 MEQ PO TBCR
40.0000 meq | EXTENDED_RELEASE_TABLET | ORAL | Status: AC
  Administered 2017-05-30 (×2): 40 meq via ORAL
  Filled 2017-05-30 (×2): qty 2

## 2017-05-30 NOTE — Progress Notes (Signed)
CSW following to facilitate discharge planning. Patient still has no bed offers for SNF at this time; MD aware.  CSW will continue to follow.  Laveda Abbe, [place] Clinical Social Worker [phone]

## 2017-05-30 NOTE — Progress Notes (Signed)
Pharmacy Antibiotic Note [name] is a 61 y.o. female admitted on 05/25/2017 with acute thalamic hemorrhage and cellulitis. Currently on day 4 of vancomycin.   SCr remains stable. WBC 9.   Plan: 1. Continue vancomycin 1500 mg IV every 24 hours for today   2. If vancomycin continued will need to obtain VT in next 24 - 48 hours   Height: 5\' 8"  (172.7 cm) Weight: 209 lb 9.6 oz (95.1 kg) IBW/kg (Calculated) : 63.9  Temp (24hrs), Avg:98.9 F (37.2 C), Min:98.5 F (36.9 C), Max:99.6 F (37.6 C)  Recent Labs  Lab 05/25/17 1955 05/25/17 2014 05/25/17 2251 05/26/17 0339 05/26/17 0539 05/26/17 0920  05/27/17 1725 05/28/17 0501 05/28/17 1416 05/29/17 0227 05/30/17 0448  WBC 15.0*  --   --  14.5*  --   --   --   --  9.7  --   --  9.0  CREATININE 2.59*  --   --  2.11*  --   --    < > 1.00 0.97 1.19* 1.10* 1.12*  LATICACIDVEN  --  1.96* 2.70*  --  1.8 2.0*  --   --   --   --   --   --    < > = values in this interval not displayed.     Antimicrobials this admission:  Vancomycin 11/18>>  Fluconazole 11/18>>11/23 Ciprofloxacin 11/18>>11/20  Microbiology results: 11/16 blood cx: ngtd 11/16 urine cx: neg 11/17 mrsa pcr: pos  Thank you for allowing pharmacy to be a part of this patient's care.  Vincenza Hews, PharmD, BCPS 05/30/2017, 11:11 AM

## 2017-05-30 NOTE — Progress Notes (Addendum)
[facility] Progress Note  Patient: [name] KPT:[id number]   PCP: Nolene Ebbs, MD DOB: 1956-06-21   DOA: 05/25/2017   DOS: 05/30/2017   Date of Service: the patient was seen and examined on 05/30/2017  Subjective: Afebrile, no chest pain, no nausea, no vomiting.  Oriented x2 able to follow commands appropriately.  Weak, deconditioned and with right side weakness as a residual from stroke.  Brief hospital course: Pt. 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 Currently further plan is continue Antibiotics.  Assessment and Plan: 1. Acute Thalamic Hemorrhage: possibly due to cocaine abuse in setting of HTN emergency, Hx CVA, Seizure, ETOH   - appreciate Neurology consult, initially was on antiplatelet medication currently on none due to hemorrhagic stroke.. -continue home dose of Keppra -monitor CIWA; continue using ativan PRN -continue folate and thiamine  -Neurology currently signed off, outpatient follow-up with [facility] since patient is discharged from [facility] neurology. -will continue BP control and patient advised to quit using recreational drugs.  2. DKA:   -resolved and has remain fairly well controlled currently -advise not to skip meals and to be compliant with low carb diet -will continue 70/30 insulin and SSI -if difficulties occurred controlling her CBG's will start long acting insulin and add meal coverage.  3. Hx Tobacco abuse/PSA -cessation counseling provided -per neurology no nicotine patch  4. HTN Emergency;  With hx HTN: Chronic diastolic CHF -BP is stable and well controlled currently -continue holding lisinopril due to AKI -continue norvasc -no B-blocker in setting of active use of  cocaine -will monitor and adjust antihypertensive regimen as needed -heart failure compensated -will follow daily weights  5. TZG:YFVCBSWHQP 2.59 on admission, up from baseline of 0.96; -foley cath removed -urinating on her own -Cr 1.12 -will follow trend   6. Multiple Excoriations and Ulcerations with questionable secondary bacterial infection: - excoriations on panus and perineum area with multiple small pustules in periphery; question bacterial super-infection. - Nystatin cream PRN, finish added diflucan 100 mg daily (plans is for 5 days treatment total) -sacral ulcer and unstageable calf ulcer.    -Follow recommendations from wound care.   -Continuing empiric Antibiotics  -WBC's trended down and patient is afebrile  Diet: dys 3. DVT Prophylaxis: mechanical compression device  Advance goals of care discussion: full code  Family Communication: no family at bedside.  Disposition:  Discharge to SNF, when bed available.  Consultants: neurology, PCCM  Procedures: see below for x-ray reports, Echocardiogram   Antibiotics: Anti-infectives (From admission, onward)   Start     Dose/Rate Route Frequency Ordered Stop   05/27/17 1100  ciprofloxacin (CIPRO) IVPB 400 mg  Status:  Discontinued     400 mg 200 mL/hr over 60 Minutes Intravenous Every 12 hours 05/27/17 1004 05/29/17 0801   05/27/17 1100  vancomycin (VANCOCIN) 1,500 mg in sodium chloride 0.9 % 500 mL IVPB     1,500 mg 250 mL/hr over 120 Minutes Intravenous Every 24 hours 05/27/17 1004     05/27/17 1000  fluconazole (DIFLUCAN) tablet 100 mg     100 mg Oral Daily 05/27/17 0956 06/01/17 0959      Objective: Physical Exam: Vitals:   05/30/17 0204 05/30/17 0526 05/30/17 0800 05/30/17 1331  BP: 136/79 (!) 142/72 (!) 155/66 (!) 152/77  Pulse: (!) 112 Marland Kitchen)  103 (!) 103 (!) 112  Resp: 16 16 20 20   Temp: 98.6 F (37 C) 98.5 F (36.9 C) 98.7 F (37.1 C) 98.9 F (37.2 C)  TempSrc: Oral Oral Oral Oral  SpO2: 98% 98% 97% 98%   Weight:      Height:        Intake/Output Summary (Last 24 hours) at 05/30/2017 1457 Last data filed at 05/30/2017 1300 Gross per 24 hour  Intake 444 ml  Output 1500 ml  Net -1056 ml   Filed Weights   05/25/17 1848 05/26/17 0900 05/28/17 1614  Weight: 98.9 kg (218 lb) 86.7 kg (191 lb 2.2 oz) 95.1 kg (209 lb 9.6 oz)   General: Alert, awake and oriented x2.  No fever, no CP and no SOB. Eyes: PERRLA, no icterus, no nystagmus Cardiovascular: S1 and S2, no murmurs, no gallops, no rubs  Respiratory: Good air movement bilaterally, normal respiratory effort, no use of accessory muscles. No wheezing, no crackles. Abdomen: Positive bowel sounds, no tenderness, no distention.  Skin: excoriations and redness appreciated on her skin folds and groin; RLE with tenderness and suppuration around wound. Extremities:no cyanosis, no clubbing  Neurologic: MS 4/5 LUE and LLE; MS 3/5 RLE and RUE; no other focal motor deficit appreciated. Very mild dysarthria. Oriented X2  Data Reviewed: CBC: Recent Labs  Lab 05/25/17 1955 05/26/17 0339 05/28/17 0501 05/30/17 0448  WBC 15.0* 14.5* 9.7 9.0  NEUTROABS 12.4*  --   --   --   HGB 11.5* 12.3 10.7* 10.7*  HCT 35.6* 37.3 32.9* 32.2*  MCV 95.4 93.3 91.1 91.0  PLT 309 294 205 419   Basic Metabolic Panel: Recent Labs  Lab 05/26/17 0900  05/27/17 1725 05/28/17 0501 05/28/17 1416 05/29/17 0227 05/30/17 0448  NA  --    < > 144 139 135 139 138  K  --    < > 3.0* 3.2* 5.5* 3.2* 3.4*  CL  --    < > 118* 114* 108 114* 110  CO2  --    < > 22 20* 19* 19* 21*  GLUCOSE  --    < > 74 61* 349* 113* 208*  BUN  --    < > 8 5* 5* 6 6  CREATININE  --    < > 1.00 0.97 1.19* 1.10* 1.12*  CALCIUM  --    < > 8.5* 7.8* 7.8* 7.9* 8.1*  MG 2.0  --   --  1.2*  --   --  1.7  PHOS 1.9*  --   --  1.3*  --   --   --    < > = values in this interval not displayed.    Liver Function Tests: Recent Labs  Lab 05/25/17 1955  AST 12*  ALT 9*  ALKPHOS 58  BILITOT 2.0*   PROT 7.9  ALBUMIN 2.7*   Recent Labs  Lab 05/25/17 1955  LIPASE 75*   Recent Labs  Lab 05/25/17 1955  AMMONIA 28   Coagulation Profile: Recent Labs  Lab 05/25/17 1955  INR 1.28   CBG: Recent Labs  Lab 05/29/17 1118 05/29/17 1648 05/29/17 1957 05/30/17 0758 05/30/17 1158  GLUCAP 210* 195* 209* 240* 145*   Studies: No results found.  Scheduled Meds: . amLODipine  10 mg Oral Daily  . Chlorhexidine Gluconate Cloth  6 each Topical Daily  . collagenase   Topical Daily  . cyclobenzaprine  10 mg Oral QHS  . fluconazole  100 mg Oral Daily  .  folic acid  1 mg Oral Daily  . gabapentin  300 mg Oral TID  . Gerhardt's butt cream   Topical QID  . insulin aspart  0-15 Units Subcutaneous TID WC  . insulin aspart  0-5 Units Subcutaneous QHS  . insulin aspart protamine- aspart  15 Units Subcutaneous BID WC  . levETIRAcetam  500 mg Oral BID  . mouth rinse  15 mL Mouth Rinse BID  . multivitamin with minerals  1 tablet Oral Daily  . mupirocin ointment  1 application Nasal BID  . nystatin cream   Topical BID  . oxybutynin  5 mg Oral QHS  . pantoprazole  40 mg Oral QHS  . senna-docusate  1 tablet Oral BID  . thiamine  100 mg Oral Daily   Continuous Infusions: . clevidipine Stopped (05/28/17 0000)  . vancomycin Stopped (05/30/17 1252)   PRN Meds: acetaminophen, albuterol, hydrALAZINE, ondansetron (ZOFRAN) IV, sodium chloride flush, zolpidem  Time spent: 30 minutes  Author: Barton Dubois, MD [facility] Pager: [phone] 05/30/2017 2:57 PM  If 7PM-7AM, please contact night-coverage at [email or web address], password [facility]

## 2017-05-31 LAB — BASIC METABOLIC PANEL
Anion gap: 5 (ref 5–15)
BUN: 6 mg/dL (ref 6–20)
CALCIUM: 8.4 mg/dL — AB (ref 8.9–10.3)
CO2: 21 mmol/L — ABNORMAL LOW (ref 22–32)
Chloride: 112 mmol/L — ABNORMAL HIGH (ref 101–111)
Creatinine, Ser: 1.22 mg/dL — ABNORMAL HIGH (ref 0.44–1.00)
GFR calc Af Amer: 55 mL/min — ABNORMAL LOW (ref >60)
GFR, EST NON AFRICAN AMERICAN: 47 mL/min — AB (ref >60)
GLUCOSE: 192 mg/dL — AB (ref 65–99)
Potassium: 4.3 mmol/L (ref 3.5–5.1)
SODIUM: 138 mmol/L (ref 135–145)

## 2017-05-31 LAB — CULTURE, BLOOD (ROUTINE X 2)
CULTURE: NO GROWTH
Special Requests: ADEQUATE

## 2017-05-31 LAB — PHOSPHORUS: Phosphorus: 3.9 mg/dL (ref 2.5–4.6)

## 2017-05-31 LAB — MAGNESIUM: Magnesium: 1.7 mg/dL (ref 1.7–2.4)

## 2017-05-31 LAB — GLUCOSE, CAPILLARY
Glucose-Capillary: 219 mg/dL — ABNORMAL HIGH (ref 65–99)
Glucose-Capillary: 256 mg/dL — ABNORMAL HIGH (ref 65–99)
Glucose-Capillary: 196 mg/dL — ABNORMAL HIGH (ref 65–99)

## 2017-05-31 MED ORDER — INSULIN ASPART PROT & ASPART (70-30 MIX) 100 UNIT/ML ~~LOC~~ SUSP
18.0000 [IU] | Freq: Two times a day (BID) | SUBCUTANEOUS | Status: DC
  Administered 2017-05-31 – 2017-06-04 (×8): 18 [IU] via SUBCUTANEOUS
  Filled 2017-05-31: qty 10

## 2017-05-31 NOTE — Progress Notes (Addendum)
Nurse concerned pt has PICC when its no longer medically necessary. MD paged. "5w23. Pt. has a PICC inserted she is no longer getting clevidipine. Can we d/c PICC to prevent CLABSI?"  Will continue to monitor.   MD acknowledged, patient has poor vascular and is difficult stick. As well as needing to receive vancomycin.

## 2017-05-31 NOTE — Progress Notes (Addendum)
[facility] Progress Note  Patient: [name] IWL:[id number]   PCP: Nolene Ebbs, MD DOB: January 02, 1956   DOA: 05/25/2017   DOS: 05/31/2017   Date of Service: the patient was seen and examined on 05/31/2017  Subjective: No fever, no chest pain, no nausea, no vomiting.  Patient has remained oriented x2.  Still weak and with residual right side hemiparesis with a muscle strength of 3 out of 5 upper and lower extremities.  Continue to have pain on her left lower extremity and yellowish separation.  Waiting for bed availability is at a skilled nursing facility in order to be discharged.   Brief hospital course: Pt. 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 Currently further plan is continue Antibiotics.  Assessment and Plan: 1. Acute Thalamic Hemorrhage: possibly due to cocaine abuse in setting of HTN emergency, Hx CVA, Seizure, ETOH   - appreciate Neurology consult, initially was on antiplatelet medication currently on none due to hemorrhagic stroke.. -continue home dose of Keppra -monitor CIWA; continue using ativan PRN -continue folate and thiamine  -Neurology currently signed off, outpatient follow-up with [place] neurology since patient is discharged from [facility] neurology. -will continue BP control and patient advised to quit using recreational drugs.  2. DKA:   -resolved and has remain fairly well controlled currently -advise not to skip meals and to be compliant with low carb diet -will continue 70/30 insulin and SSI; first 1 will be adjusted to 18 units twice a day for better control of her sugar. -Continue monitoring and if difficulties occurred controlling her CBG's will start long acting insulin and add meal coverage.  3. Hx  Tobacco abuse/PSA -cessation counseling provided -per neurology no nicotine patch  4. HTN Emergency;  With hx HTN: Chronic diastolic CHF -BP is stable and well controlled currently -continue holding lisinopril due to AKI -continue norvasc -no B-blocker in setting of active use of cocaine -will monitor and adjust antihypertensive regimen as needed -heart failure compensated -will follow daily weights  5. RDE:YCXKGYJEHU 2.59 on admission, up from baseline of 0.96; -foley cath removed -urinating on her own -Cr 1.12 -will follow trend   6. Multiple Excoriations and Ulcerations with questionable secondary bacterial infection: - excoriations on panus and perineum area with multiple small pustules in periphery; question bacterial super-infection. - Nystatin cream PRN, finish added diflucan 100 mg daily (plans is for 5 days treatment total) -sacral ulcer and unstageable calf ulcer.    -Follow recommendations from wound care.   -Continuing empiric Antibiotics  -WBC's trended down and patient is afebrile  Diet: dys 3. DVT Prophylaxis: SCD's  Advance goals of care discussion: full code  Family Communication: no family at bedside.  Disposition:  Discharge to SNF, when bed available.  Patient still has no bed offers for skilled nursing facility discharge.  Consultants: neurology, PCCM  Procedures: see below for x-ray reports, Echocardiogram   Antibiotics: Anti-infectives (From admission, onward)   Start     Dose/Rate Route Frequency Ordered Stop   05/27/17 1100  ciprofloxacin (CIPRO) IVPB 400 mg  Status:  Discontinued     400 mg 200 mL/hr over 60 Minutes Intravenous Every 12 hours 05/27/17 1004 05/29/17 0801   05/27/17 1100  vancomycin (VANCOCIN) 1,500 mg in sodium chloride 0.9 % 500 mL IVPB     1,500 mg  250 mL/hr over 120 Minutes Intravenous Every 24 hours 05/27/17 1004     05/27/17 1000  fluconazole (DIFLUCAN) tablet 100 mg     100 mg Oral Daily 05/27/17 0956 05/31/17 0818        Objective: Physical Exam: Vitals:   05/30/17 2120 05/31/17 0404 05/31/17 0556 05/31/17 0841  BP: (!) 161/91 (!) 143/91 (!) 142/97 (!) 177/88  Pulse: (!) 104 (!) 106 (!) 102 (!) 103  Resp: 16  18 17   Temp: 98.5 F (36.9 C)  98.6 F (37 C) 98.2 F (36.8 C)  TempSrc: Oral  Oral Oral  SpO2: 100%  100% 99%  Weight:      Height:        Intake/Output Summary (Last 24 hours) at 05/31/2017 1615 Last data filed at 05/31/2017 0558 Gross per 24 hour  Intake 10 ml  Output 700 ml  Net -690 ml   Filed Weights   05/25/17 1848 05/26/17 0900 05/28/17 1614  Weight: 98.9 kg (218 lb) 86.7 kg (191 lb 2.2 oz) 95.1 kg (209 lb 9.6 oz)   General: AAOX2, flat affect, in no acute distress. Afebrile, no CP, no SOB>. Still with some pain in her left lower extremity and actively having yellowish drainage. Eyes: PERRLA, no icterus, no nystagmus Cardiovascular: S1 and S2, no murmurs, no gallops, no rubs. Respiratory: Good air movement bilaterally, no wheezing, no crackles, normal respiratory effort.  Good oxygen saturation on room air.  Abdomen: Soft, nontender, nondistended, positive bowel sounds.    Skin: excoriations and redness appreciated on her skin folds and groin; RLE with tenderness and yellowish suppuration around wound. Extremities: lower extremity without cyanosis and no clubbing  Neurologic: Patient neurologic exam unchanged from 11/21; MS 4/5 LUE and LLE; MS 3/5 RLE and RUE; no other focal motor deficit appreciated. Very mild dysarthria. Oriented X2  Data Reviewed: CBC: Recent Labs  Lab 05/25/17 1955 05/26/17 0339 05/28/17 0501 05/30/17 0448  WBC 15.0* 14.5* 9.7 9.0  NEUTROABS 12.4*  --   --   --   HGB 11.5* 12.3 10.7* 10.7*  HCT 35.6* 37.3 32.9* 32.2*  MCV 95.4 93.3 91.1 91.0  PLT 309 294 205 542   Basic Metabolic Panel: Recent Labs  Lab 05/26/17 0900  05/28/17 0501 05/28/17 1416 05/29/17 0227 05/30/17 0448 05/31/17 0359  NA  --    < > 139 135 139 138 138  K  --    <  > 3.2* 5.5* 3.2* 3.4* 4.3  CL  --    < > 114* 108 114* 110 112*  CO2  --    < > 20* 19* 19* 21* 21*  GLUCOSE  --    < > 61* 349* 113* 208* 192*  BUN  --    < > 5* 5* 6 6 6   CREATININE  --    < > 0.97 1.19* 1.10* 1.12* 1.22*  CALCIUM  --    < > 7.8* 7.8* 7.9* 8.1* 8.4*  MG 2.0  --  1.2*  --   --  1.7 1.7  PHOS 1.9*  --  1.3*  --   --   --  3.9   < > = values in this interval not displayed.    Liver Function Tests: Recent Labs  Lab 05/25/17 1955  AST 12*  ALT 9*  ALKPHOS 58  BILITOT 2.0*  PROT 7.9  ALBUMIN 2.7*   Recent Labs  Lab 05/25/17 1955  LIPASE 75*   Recent Labs  Lab 05/25/17 1955  AMMONIA 28   Coagulation Profile: Recent Labs  Lab 05/25/17 1955  INR 1.28   CBG: Recent Labs  Lab 05/30/17 1158 05/30/17 1646 05/30/17 2245 05/31/17 0755 05/31/17 1206  GLUCAP 145* 249* 188* 219* 256*   Studies: No results found.  Scheduled Meds: . amLODipine  10 mg Oral Daily  . Chlorhexidine Gluconate Cloth  6 each Topical Daily  . collagenase   Topical Daily  . cyclobenzaprine  10 mg Oral QHS  . folic acid  1 mg Oral Daily  . gabapentin  300 mg Oral TID  . Gerhardt's butt cream   Topical QID  . insulin aspart  0-15 Units Subcutaneous TID WC  . insulin aspart  0-5 Units Subcutaneous QHS  . insulin aspart protamine- aspart  15 Units Subcutaneous BID WC  . levETIRAcetam  500 mg Oral BID  . mouth rinse  15 mL Mouth Rinse BID  . multivitamin with minerals  1 tablet Oral Daily  . nystatin cream   Topical BID  . oxybutynin  5 mg Oral QHS  . pantoprazole  40 mg Oral QHS  . senna-docusate  1 tablet Oral BID  . thiamine  100 mg Oral Daily   PRN Meds: acetaminophen, albuterol, hydrALAZINE, ondansetron (ZOFRAN) IV, sodium chloride flush, zolpidem  Time spent: 30 minutes  Author: Barton Dubois, MD [facility] Pager: [phone] 05/31/2017 4:15 PM  If 7PM-7AM, please contact night-coverage at [email or web address], password [facility]

## 2017-06-01 LAB — CBC
HCT: 31.1 % — ABNORMAL LOW (ref 36.0–46.0)
HEMOGLOBIN: 10.3 g/dL — AB (ref 12.0–15.0)
MCH: 30.5 pg (ref 26.0–34.0)
MCHC: 33.1 g/dL (ref 30.0–36.0)
MCV: 92 fL (ref 78.0–100.0)
Platelets: 269 10*3/uL (ref 150–400)
RBC: 3.38 MIL/uL — ABNORMAL LOW (ref 3.87–5.11)
RDW: 13 % (ref 11.5–15.5)
WBC: 10 10*3/uL (ref 4.0–10.5)

## 2017-06-01 LAB — BASIC METABOLIC PANEL
ANION GAP: 7 (ref 5–15)
BUN: 7 mg/dL (ref 6–20)
CALCIUM: 8.5 mg/dL — AB (ref 8.9–10.3)
CO2: 22 mmol/L (ref 22–32)
CREATININE: 1.26 mg/dL — AB (ref 0.44–1.00)
Chloride: 111 mmol/L (ref 101–111)
GFR calc Af Amer: 53 mL/min — ABNORMAL LOW (ref >60)
GFR calc non Af Amer: 45 mL/min — ABNORMAL LOW (ref >60)
GLUCOSE: 175 mg/dL — AB (ref 65–99)
Potassium: 3.5 mmol/L (ref 3.5–5.1)
Sodium: 140 mmol/L (ref 135–145)

## 2017-06-01 LAB — GLUCOSE, CAPILLARY
GLUCOSE-CAPILLARY: 177 mg/dL — AB (ref 65–99)
GLUCOSE-CAPILLARY: 180 mg/dL — AB (ref 65–99)
Glucose-Capillary: 328 mg/dL — ABNORMAL HIGH (ref 65–99)
Glucose-Capillary: 170 mg/dL — ABNORMAL HIGH (ref 65–99)
Glucose-Capillary: 219 mg/dL — ABNORMAL HIGH (ref 65–99)

## 2017-06-01 MED ORDER — DOXYCYCLINE HYCLATE 100 MG PO TABS
100.0000 mg | ORAL_TABLET | Freq: Two times a day (BID) | ORAL | Status: DC
  Administered 2017-06-03 – 2017-06-04 (×3): 100 mg via ORAL
  Filled 2017-06-01 (×3): qty 1

## 2017-06-01 NOTE — Progress Notes (Signed)
[facility] Progress Note  Patient: [name] [id number]:[id number]   PCP: Nolene Ebbs, MD DOB: May 16, 1956   DOA: 05/25/2017   DOS: 06/01/2017   Date of Service: the patient was seen and examined on 06/01/2017  Subjective: afebrile, no CP, no SOB. Patient stil with pain her RLE, suppuration seen; but overall improved. No nausea or vomiting.   Brief hospital course: Pt. 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 Currently further plan is continue Antibiotics.  Assessment and Plan: 1. Acute Thalamic Hemorrhage: possibly due to cocaine abuse in setting of HTN emergency, Hx CVA, Seizure, ETOH   - appreciate Neurology consult, initially was on antiplatelet medication currently on none due to hemorrhagic stroke.. -continue home dose of Keppra -monitor CIWA; continue using ativan PRN -continue folate and thiamine  -Neurology currently signed off, outpatient follow-up with [place] neurology since patient is discharged from [facility] neurology. -will continue BP control and patient advised to quit using recreational drugs.  2. DKA:   -resolved and has remain fairly well controlled currently -advise not to skip meals and to be compliant with low carb diet -will continue 70/30 insulin and SSI; first 1 will be adjusted to 18 units twice a day for better control of her sugar. -Continue monitoring and if difficulties occurred controlling her CBG's will start long acting insulin and add meal coverage.  3. Hx Tobacco abuse/PSA -cessation counseling provided -per neurology no nicotine patch  4. HTN Emergency;  With hx HTN: Chronic diastolic CHF -BP is stable and well controlled currently -continue holding lisinopril due to AKI -continue norvasc -no  B-blocker in setting of active use of cocaine -will monitor and adjust antihypertensive regimen as needed -heart failure compensated -will follow daily weights  5. WFU:XNATFTDDUK 2.59 on admission, up from baseline of 0.96; -foley cath removed -urinating on her own -Cr 1.12 -will follow trend   6. Multiple Excoriations and Ulcerations with questionable secondary bacterial infection: - excoriations on panus and perineum area with multiple small pustules in periphery; question bacterial super-infection. - Nystatin cream PRN, finish added diflucan 100 mg daily (plans is for 5 days treatment total) -sacral ulcer and unstageable calf ulcer.    -Follow recommendations from wound care.   -Continuing empiric Antibiotics for one more day IV; then switch to 7 days using doxycycline PO. -WBC's trended down and patient has remained afebrile  Diet: dys 3. DVT Prophylaxis: SCD's  Advance goals of care discussion: full code  Family Communication: no family at bedside.  Disposition:  Discharge to SNF, when bed available.  Patient still has no bed offers for skilled nursing facility discharge.  Consultants: neurology, PCCM  Procedures: see below for x-ray reports, Echocardiogram   Antibiotics: Anti-infectives (From admission, onward)   Start     Dose/Rate Route Frequency Ordered Stop   06/03/17 1000  doxycycline (VIBRA-TABS) tablet 100 mg     100 mg Oral Every 12 hours 06/01/17 1948 06/10/17 0959   05/27/17 1100  ciprofloxacin (CIPRO) IVPB 400 mg  Status:  Discontinued     400 mg 200 mL/hr over 60 Minutes Intravenous Every 12 hours 05/27/17 1004 05/29/17 0801   05/27/17 1100  vancomycin (VANCOCIN) 1,500 mg in sodium chloride 0.9 % 500 mL IVPB     1,500 mg 250 mL/hr over 120 Minutes Intravenous Every 24 hours  05/27/17 1004 06/02/17 1946   05/27/17 1000  fluconazole (DIFLUCAN) tablet 100 mg     100 mg Oral Daily 05/27/17 0956 05/31/17 0818      Objective: Physical Exam: Vitals:    06/01/17 0551 06/01/17 0815 06/01/17 1218 06/01/17 1628  BP: (!) 164/82 139/78 135/78 (!) 167/91  Pulse: (!) 109 (!) 105 98 (!) 106  Resp: 18 16 14 15   Temp: 98.8 F (37.1 C) 98 F (36.7 C) 98.4 F (36.9 C) 98.6 F (37 C)  TempSrc: Oral Oral Oral Oral  SpO2: 100% 99% 99% 97%  Weight:      Height:        Intake/Output Summary (Last 24 hours) at 06/01/2017 1948 Last data filed at 06/01/2017 0500 Gross per 24 hour  Intake -  Output 2400 ml  Net -2400 ml   Filed Weights   05/25/17 1848 05/26/17 0900 05/28/17 1614  Weight: 98.9 kg (218 lb) 86.7 kg (191 lb 2.2 oz) 95.1 kg (209 lb 9.6 oz)   General: AAOX2, no CP, no nausea, no vomiting, no abd pain. Continue complaining of intermittent pain in her RLE. No fever. Eyes: PERRLA, no icterus, no nystagmus Cardiovascular: S1 and S2, no rubs, no gallops. Respiratory: good air movement. No wheezing.  Abdomen: soft, NT, ND, positive BS.    Skin: excoriations and redness appreciated on her skin folds and groin; RLE with tenderness and yellowish suppuration around wound. Extremities: no cyanosis, no clubbing  Neurologic: Patient neurologic exam unchanged from 11/22; MS 4/5 LUE and LLE; MS 3/5 RLE and RUE; no other focal motor deficit appreciated. Very mild dysarthria. Oriented X2  Data Reviewed: CBC: Recent Labs  Lab 05/25/17 1955 05/26/17 0339 05/28/17 0501 05/30/17 0448 06/01/17 0403  WBC 15.0* 14.5* 9.7 9.0 10.0  NEUTROABS 12.4*  --   --   --   --   HGB 11.5* 12.3 10.7* 10.7* 10.3*  HCT 35.6* 37.3 32.9* 32.2* 31.1*  MCV 95.4 93.3 91.1 91.0 92.0  PLT 309 294 205 209 528   Basic Metabolic Panel: Recent Labs  Lab 05/26/17 0900  05/28/17 0501 05/28/17 1416 05/29/17 0227 05/30/17 0448 05/31/17 0359 06/01/17 0403  NA  --    < > 139 135 139 138 138 140  K  --    < > 3.2* 5.5* 3.2* 3.4* 4.3 3.5  CL  --    < > 114* 108 114* 110 112* 111  CO2  --    < > 20* 19* 19* 21* 21* 22  GLUCOSE  --    < > 61* 349* 113* 208* 192* 175*    BUN  --    < > 5* 5* 6 6 6 7   CREATININE  --    < > 0.97 1.19* 1.10* 1.12* 1.22* 1.26*  CALCIUM  --    < > 7.8* 7.8* 7.9* 8.1* 8.4* 8.5*  MG 2.0  --  1.2*  --   --  1.7 1.7  --   PHOS 1.9*  --  1.3*  --   --   --  3.9  --    < > = values in this interval not displayed.    Liver Function Tests: Recent Labs  Lab 05/25/17 1955  AST 12*  ALT 9*  ALKPHOS 58  BILITOT 2.0*  PROT 7.9  ALBUMIN 2.7*   Recent Labs  Lab 05/25/17 1955  LIPASE 75*   Recent Labs  Lab 05/25/17 1955  AMMONIA 28   Coagulation Profile: Recent Labs  Lab 05/25/17 1955  INR 1.28   CBG: Recent Labs  Lab 05/31/17 1659 05/31/17 2123 06/01/17 0755 06/01/17 1215 06/01/17 1626  GLUCAP 328* 196* 177* 180* 170*   Studies: No results found.  Scheduled Meds: . amLODipine  10 mg Oral Daily  . Chlorhexidine Gluconate Cloth  6 each Topical Daily  . collagenase   Topical Daily  . cyclobenzaprine  10 mg Oral QHS  . [START ON 06/03/2017] doxycycline  100 mg Oral Q12H  . folic acid  1 mg Oral Daily  . gabapentin  300 mg Oral TID  . Gerhardt's butt cream   Topical QID  . insulin aspart  0-15 Units Subcutaneous TID WC  . insulin aspart  0-5 Units Subcutaneous QHS  . insulin aspart protamine- aspart  18 Units Subcutaneous BID WC  . levETIRAcetam  500 mg Oral BID  . mouth rinse  15 mL Mouth Rinse BID  . multivitamin with minerals  1 tablet Oral Daily  . nystatin cream   Topical BID  . oxybutynin  5 mg Oral QHS  . pantoprazole  40 mg Oral QHS  . senna-docusate  1 tablet Oral BID  . thiamine  100 mg Oral Daily   PRN Meds: acetaminophen, albuterol, hydrALAZINE, ondansetron (ZOFRAN) IV, sodium chloride flush, zolpidem  Time spent: 30 minutes  Author: Barton Dubois, MD [facility] Pager: [phone] 06/01/2017 7:48 PM  If 7PM-7AM, please contact night-coverage at [email or web address], password [facility]

## 2017-06-02 LAB — BASIC METABOLIC PANEL
ANION GAP: 8 (ref 5–15)
BUN: <5 mg/dL — ABNORMAL LOW (ref 6–20)
CO2: 23 mmol/L (ref 22–32)
Calcium: 8.6 mg/dL — ABNORMAL LOW (ref 8.9–10.3)
Chloride: 108 mmol/L (ref 101–111)
Creatinine, Ser: 1.29 mg/dL — ABNORMAL HIGH (ref 0.44–1.00)
GFR calc Af Amer: 51 mL/min — ABNORMAL LOW (ref >60)
GFR, EST NON AFRICAN AMERICAN: 44 mL/min — AB (ref >60)
GLUCOSE: 106 mg/dL — AB (ref 65–99)
POTASSIUM: 3.5 mmol/L (ref 3.5–5.1)
Sodium: 139 mmol/L (ref 135–145)

## 2017-06-02 LAB — GLUCOSE, CAPILLARY
GLUCOSE-CAPILLARY: 104 mg/dL — AB (ref 65–99)
Glucose-Capillary: 209 mg/dL — ABNORMAL HIGH (ref 65–99)
Glucose-Capillary: 316 mg/dL — ABNORMAL HIGH (ref 65–99)
Glucose-Capillary: 170 mg/dL — ABNORMAL HIGH (ref 65–99)

## 2017-06-02 LAB — VANCOMYCIN, TROUGH: Vancomycin Tr: 25 ug/mL (ref 15–20)

## 2017-06-02 MED ORDER — ALTEPLASE 2 MG IJ SOLR
2.0000 mg | Freq: Once | INTRAMUSCULAR | Status: AC
  Administered 2017-06-02: 2 mg
  Filled 2017-06-02 (×2): qty 2

## 2017-06-02 NOTE — Plan of Care (Signed)
  Nutrition: Risk of aspiration will decrease 06/02/2017 0517 - Progressing by Verne Grain, RN   Progressing Self-Care: Ability to participate in self-care as condition permits will improve Encouraged patient to move self slightly in bed to prevent skin impairment. 06/02/2017 0517 - Progressing by Verne Grain, RN Nutrition: Risk of aspiration will decrease 06/02/2017 0517 - Progressing by Verne Grain, RN Skin Integrity: Risk for impaired skin integrity will decrease 06/02/2017 0517 - Progressing by Verne Grain, RN Note Q2-q4 turns overnight   Patient does well with nonverbal communications and uses call bell appropriately

## 2017-06-02 NOTE — Progress Notes (Signed)
Patient Vancomycin Trough Lab Value -25  Called  Pharmacist and asked about Vancomycin IV infusion .Pharmacist told that to hold the Vancomycin medication .Informed to the doctor regarding this.

## 2017-06-02 NOTE — Progress Notes (Signed)
CS[place].  Percell Locus Kania Regnier LCSWA [phone]

## 2017-06-02 NOTE — Progress Notes (Signed)
[facility] Progress Note  Patient: [name] GXQ:[id number]   PCP: Nolene Ebbs, MD DOB: [birth year]   DOA: 05/25/2017   DOS: 06/02/2017   Date of Service: the patient was seen and examined on 06/02/2017  Subjective: no fever, no CP, no nausea, no vomiting, SOB. Still with RLE pain and suppuration (even improved and less).  Brief hospital course: Pt. 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 Currently further plan is continue Antibiotics.  Assessment and Plan: 1. Acute Thalamic Hemorrhage: possibly due to cocaine abuse in setting of HTN emergency, Hx CVA, Seizure, ETOH   - appreciate Neurology consult, initially was on antiplatelet medication currently on none due to hemorrhagic stroke.. -continue home dose of Keppra -monitor CIWA; continue using ativan PRN -continue folate and thiamine  -Neurology currently signed off, outpatient follow-up with [place] neurology since patient is discharged from [facility] neurology. -will continue BP control and patient advised to quit using recreational drugs.  2. DKA:   -resolved and has remain fairly well controlled currently -advise not to skip meals and to be compliant with low carb diet -will continue 70/30 insulin and SSI; first 1 will be adjusted to 18 units twice a day for better control of her sugar. -Continue monitoring and if difficulties occurred controlling her CBG's will start long acting insulin and add meal coverage.  3. Hx Tobacco abuse/PSA -cessation counseling provided -per neurology no nicotine patch for now  4. HTN Emergency;  With hx HTN: Chronic diastolic CHF -BP is stable and well controlled currently -continue holding lisinopril due to AKI -will continue amlodipine for  BP control -no B-blocker in setting of active use of cocaine -will monitor and adjust antihypertensive regimen as needed -heart failure compensated -continue to follow daily weights  5. XKG:YJEHUDJSHF 2.59 on admission, up from baseline of 0.96; -foley cath removed -urinating on her own and reporting good urine output  -Cr 1.29 -will follow trend   6. Multiple Excoriations and Ulcerations with questionable secondary bacterial infection: - excoriations on panus and perineum area with multiple small pustules in periphery; question bacterial super-infection. - Nystatin cream PRN, finish added diflucan 100 mg daily (plans is for 5 days treatment total) -sacral ulcer and unstageable calf ulcer.    -Follow recommendations from wound care.   -Continue Vancomycin for tday and then switch to doxycycline PO as previously planned.  -WBC's trended down to normal range and patient has remained afebrile  Diet: dys 3. DVT Prophylaxis: SCD's  Advance goals of care discussion: full code  Family Communication: no family at bedside.  Disposition:  Discharge to SNF, when bed available.  Patient still has no bed offers for skilled nursing facility discharge.  Consultants: neurology, PCCM  Procedures: see below for x-ray reports, Echocardiogram   Antibiotics: Anti-infectives (From admission, onward)   Start     Dose/Rate Route Frequency Ordered Stop   06/03/17 1000  doxycycline (VIBRA-TABS) tablet 100 mg     100 mg Oral Every 12 hours 06/01/17 1948 06/10/17 0959   05/27/17 1100  ciprofloxacin (CIPRO) IVPB 400 mg  Status:  Discontinued     400 mg 200 mL/hr over 60 Minutes Intravenous Every 12 hours 05/27/17 1004 05/29/17 0801   05/27/17 1100  vancomycin (VANCOCIN) 1,500 mg in sodium chloride 0.9 % 500 mL IVPB  1,500 mg 250 mL/hr over 120 Minutes Intravenous Every 24 hours 05/27/17 1004 06/02/17 1946   05/27/17 1000  fluconazole (DIFLUCAN) tablet 100 mg     100 mg Oral Daily 05/27/17 0956  05/31/17 0818      Objective: Physical Exam: Vitals:   06/01/17 1218 06/01/17 1628 06/01/17 2100 06/02/17 0511  BP: 135/78 (!) 167/91 (!) 159/79 (!) 145/72  Pulse: 98 (!) 106 (!) 103 (!) 102  Resp: 14 15 18 18   Temp: 98.4 F (36.9 C) 98.6 F (37 C) 98.6 F (37 C) 98.7 F (37.1 C)  TempSrc: Oral Oral Oral Oral  SpO2: 99% 97% 99% 95%  Weight:      Height:       No intake or output data in the 24 hours ending 06/02/17 1135 Filed Weights   05/25/17 1848 05/26/17 0900 05/28/17 1614  Weight: 98.9 kg (218 lb) 86.7 kg (191 lb 2.2 oz) 95.1 kg (209 lb 9.6 oz)   General: AAOX2, following commands properly, no CP, no SOB. Patient is afebrile.  Rest of physical exam unchanged from assessment on 11/23; see below for details: Eyes: PERRLA, no icterus, no nystagmus Cardiovascular: S1 and S2, no rubs, no gallops. Respiratory: good air movement. No wheezing.  Abdomen: soft, NT, ND, positive BS.    Skin: excoriations and redness appreciated on her skin folds and groin; RLE with tenderness and yellowish suppuration around wound. Extremities: no cyanosis, no clubbing  Neurologic: Patient neurologic exam unchanged from 11/22; MS 4/5 LUE and LLE; MS 3/5 RLE and RUE; no other focal motor deficit appreciated. Very mild dysarthria. Oriented X2  Data Reviewed: CBC: Recent Labs  Lab 05/28/17 0501 05/30/17 0448 06/01/17 0403  WBC 9.7 9.0 10.0  HGB 10.7* 10.7* 10.3*  HCT 32.9* 32.2* 31.1*  MCV 91.1 91.0 92.0  PLT 205 209 673   Basic Metabolic Panel: Recent Labs  Lab 05/28/17 0501  05/29/17 0227 05/30/17 0448 05/31/17 0359 06/01/17 0403 06/02/17 0515  NA 139   < > 139 138 138 140 139  K 3.2*   < > 3.2* 3.4* 4.3 3.5 3.5  CL 114*   < > 114* 110 112* 111 108  CO2 20*   < > 19* 21* 21* 22 23  GLUCOSE 61*   < > 113* 208* 192* 175* 106*  BUN 5*   < > 6 6 6 7  <5*  CREATININE 0.97   < > 1.10* 1.12* 1.22* 1.26* 1.29*  CALCIUM 7.8*   < > 7.9* 8.1* 8.4* 8.5* 8.6*  MG 1.2*  --   --  1.7 1.7   --   --   PHOS 1.3*  --   --   --  3.9  --   --    < > = values in this interval not displayed.    CBG: Recent Labs  Lab 06/01/17 0755 06/01/17 1215 06/01/17 1626 06/01/17 2059 06/02/17 0752  GLUCAP 177* 180* 170* 219* 104*   Studies: No results found.  Scheduled Meds: . alteplase  2 mg Intracatheter Once  . amLODipine  10 mg Oral Daily  . Chlorhexidine Gluconate Cloth  6 each Topical Daily  . collagenase   Topical Daily  . cyclobenzaprine  10 mg Oral QHS  . [START ON 06/03/2017] doxycycline  100 mg Oral Q12H  . folic acid  1 mg Oral Daily  . gabapentin  300 mg Oral TID  . Gerhardt's butt cream   Topical QID  . insulin aspart  0-15 Units Subcutaneous  TID WC  . insulin aspart  0-5 Units Subcutaneous QHS  . insulin aspart protamine- aspart  18 Units Subcutaneous BID WC  . levETIRAcetam  500 mg Oral BID  . mouth rinse  15 mL Mouth Rinse BID  . multivitamin with minerals  1 tablet Oral Daily  . nystatin cream   Topical BID  . oxybutynin  5 mg Oral QHS  . pantoprazole  40 mg Oral QHS  . senna-docusate  1 tablet Oral BID  . thiamine  100 mg Oral Daily   PRN Meds: acetaminophen, albuterol, hydrALAZINE, ondansetron (ZOFRAN) IV, sodium chloride flush, zolpidem  Time spent: 30 minutes  Author: Barton Dubois, MD [facility] Pager: [phone] 06/02/2017 11:35 AM  If 7PM-7AM, please contact night-coverage at [email or web address], password [facility]

## 2017-06-03 LAB — BASIC METABOLIC PANEL
ANION GAP: 7 (ref 5–15)
BUN: 7 mg/dL (ref 6–20)
CO2: 24 mmol/L (ref 22–32)
Calcium: 8.4 mg/dL — ABNORMAL LOW (ref 8.9–10.3)
Chloride: 106 mmol/L (ref 101–111)
Creatinine, Ser: 1.59 mg/dL — ABNORMAL HIGH (ref 0.44–1.00)
GFR, EST AFRICAN AMERICAN: 40 mL/min — AB (ref >60)
GFR, EST NON AFRICAN AMERICAN: 34 mL/min — AB (ref >60)
Glucose, Bld: 163 mg/dL — ABNORMAL HIGH (ref 65–99)
POTASSIUM: 3.3 mmol/L — AB (ref 3.5–5.1)
SODIUM: 137 mmol/L (ref 135–145)

## 2017-06-03 LAB — GLUCOSE, CAPILLARY
GLUCOSE-CAPILLARY: 172 mg/dL — AB (ref 65–99)
GLUCOSE-CAPILLARY: 245 mg/dL — AB (ref 65–99)
Glucose-Capillary: 156 mg/dL — ABNORMAL HIGH (ref 65–99)
Glucose-Capillary: 325 mg/dL — ABNORMAL HIGH (ref 65–99)

## 2017-06-03 MED ORDER — ENSURE ENLIVE PO LIQD: 237.0000 mL | Freq: Two times a day (BID) | ORAL | Status: DC

## 2017-06-03 MED ORDER — SODIUM CHLORIDE 0.9 % IV SOLN
INTRAVENOUS | Status: AC
  Administered 2017-06-03: 75 mL/h via INTRAVENOUS

## 2017-06-03 NOTE — Progress Notes (Signed)
[facility] Progress Note  Patient: [name] [id number]   PCP: Nolene Ebbs, MD DOB: 12/30/1955   DOA: 05/25/2017   DOS: 06/03/2017   Date of Service: the patient was seen and examined on 06/03/2017  Subjective: No fever, no chest pain, no nausea, no vomiting, patient denies shortness of breath.  Continued to experience intermittent pain in her right lower extremity and is having mild separation out of her leg wound.    Brief hospital course: Pt. 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 Currently further plan is continue Antibiotics.  Assessment and Plan: 1. Acute Thalamic Hemorrhage: possibly due to cocaine abuse in setting of HTN emergency, Hx CVA, Seizure, ETOH   - appreciate Neurology consult, initially was on antiplatelet medication currently on none due to hemorrhagic stroke.. -continue home dose of Keppra; no seizure appreciated. -monitor CIWA; continue using ativan PRN -continue folate and thiamine  -Neurology currently signed off, outpatient follow-up with [place] neurology since patient is discharged from [facility] neurology. -will continue BP control and patient advised to quit using recreational drugs.  2. DKA:   -resolved and has remain fairly well controlled currently -advise not to skip meals and to be compliant with low carb diet -will continue 70/30 insulin and SSI; first 1 will be adjusted to 18 units twice a day for better control of her sugar. -Continue monitoring and if difficulties occurred controlling her CBG's will start long acting insulin and add meal coverage.  3. Hx Tobacco abuse/PSA -cessation counseling provided -per neurology no nicotine patch for now  4. HTN Emergency;  With hx HTN: Chronic  diastolic CHF -BP is stable and well controlled currently -continue holding lisinopril due to AKI -Continue amlodipine for blood pressure control.   -no B-blocker in setting of active use of cocaine -will monitor and adjust antihypertensive regimen as needed -heart failure condition compensated -continue to follow daily weights  5. KGY:JEHUDJSHFW 2.59 on admission, up from baseline of 0.96; -foley cath removed -urinating on her own and reporting good urine output  -Has a slightly climb up , Cr now 1.59; most likely associated with elevated vancomycin level. -Will give IV fluids before 10 hours patient advised to maintain proper hydration will repeat basic metabolic panel in the morning. -Vancomycin has been discontinued at this moment.  6. Multiple Excoriations and Ulcerations with questionable secondary bacterial infection: - excoriations on panus and perineum area with multiple small pustules in periphery; question bacterial super-infection. - Nystatin cream PRN and has finished added diflucan 100 mg daily (plan was for 5 days treatment total). Skin fungal infection much improved. -sacral ulcer and unstageable calf ulcer.    -Follow recommendations from wound care.   -Continue doxycycline p.o. as previously planned.    -WBC's trended down to normal range and patient has remained afebrile  Diet: dys 3. DVT Prophylaxis: SCD's  Advance goals of care discussion: full code  Family Communication: no family at bedside.  Disposition:  Discharge to SNF, when bed available.  Patient still has no bed offers for skilled nursing facility discharge.  Consultants: neurology, PCCM  Procedures: see below for x-ray reports, Echocardiogram   Antibiotics: Anti-infectives (From admission, onward)   Start     Dose/Rate Route Frequency Ordered Stop   06/03/17 1000  doxycycline (VIBRA-TABS) tablet 100 mg  100 mg Oral Every 12 hours 06/01/17 1948 06/10/17 0959   05/27/17 1100  ciprofloxacin  (CIPRO) IVPB 400 mg  Status:  Discontinued     400 mg 200 mL/hr over 60 Minutes Intravenous Every 12 hours 05/27/17 1004 05/29/17 0801   05/27/17 1100  vancomycin (VANCOCIN) 1,500 mg in sodium chloride 0.9 % 500 mL IVPB     1,500 mg 250 mL/hr over 120 Minutes Intravenous Every 24 hours 05/27/17 1004 06/02/17 1946   05/27/17 1000  fluconazole (DIFLUCAN) tablet 100 mg     100 mg Oral Daily 05/27/17 0956 05/31/17 0818      Objective: Physical Exam: Vitals:   06/02/17 1411 06/02/17 2234 06/03/17 0704 06/03/17 0813  BP: (!) 148/80 140/67 (!) 161/79   Pulse: (!) 102 (!) 106 (!) 101   Resp: 18 18 18    Temp: 98.1 F (36.7 C) 99 F (37.2 C) 98.9 F (37.2 C)   TempSrc: Axillary     SpO2: 96% 100% 100% 100%  Weight:      Height:        Intake/Output Summary (Last 24 hours) at 06/03/2017 0950 Last data filed at 06/03/2017 0700 Gross per 24 hour  Intake -  Output 1600 ml  Net -1600 ml   Filed Weights   05/25/17 1848 05/26/17 0900 05/28/17 1614  Weight: 98.9 kg (218 lb) 86.7 kg (191 lb 2.2 oz) 95.1 kg (209 lb 9.6 oz)   General: Alert, awake and oriented x3, following commands appropriately, no chest pain, no shortness of breath, no nausea vomiting.  Patient has remained afebrile.  Rest of physical exam unchanged from assessment on 11/24; see below for details:  Eyes: PERRLA, no icterus, no nystagmus Cardiovascular: S1 and S2, no rubs, no gallops. Respiratory: good air movement. No wheezing.  Abdomen: soft, NT, ND, positive BS.    Skin: excoriations and redness appreciated on her skin folds and groin; RLE with tenderness and yellowish suppuration around wound. Extremities: no cyanosis, no clubbing  Neurologic: Patient neurologic exam unchanged from 11/24; MS 4-5/5 LUE and LLE; MS 3/5 RLE and RUE; no other focal motor deficit appreciated. Very mild dysarthria. Oriented X2  Data Reviewed: CBC: Recent Labs  Lab 05/28/17 0501 05/30/17 0448 06/01/17 0403  WBC 9.7 9.0 10.0  HGB  10.7* 10.7* 10.3*  HCT 32.9* 32.2* 31.1*  MCV 91.1 91.0 92.0  PLT 205 209 762   Basic Metabolic Panel: Recent Labs  Lab 05/28/17 0501  05/30/17 0448 05/31/17 0359 06/01/17 0403 06/02/17 0515 06/03/17 0349  NA 139   < > 138 138 140 139 137  K 3.2*   < > 3.4* 4.3 3.5 3.5 3.3*  CL 114*   < > 110 112* 111 108 106  CO2 20*   < > 21* 21* 22 23 24   GLUCOSE 61*   < > 208* 192* 175* 106* 163*  BUN 5*   < > 6 6 7  <5* 7  CREATININE 0.97   < > 1.12* 1.22* 1.26* 1.29* 1.59*  CALCIUM 7.8*   < > 8.1* 8.4* 8.5* 8.6* 8.4*  MG 1.2*  --  1.7 1.7  --   --   --   PHOS 1.3*  --   --  3.9  --   --   --    < > = values in this interval not displayed.    CBG: Recent Labs  Lab 06/02/17 0752 06/02/17 1232 06/02/17 1714 06/02/17 2238 06/03/17 0747  GLUCAP 104* 209* 316* 170* 172*  Studies: No results found.  Scheduled Meds: . amLODipine  10 mg Oral Daily  . Chlorhexidine Gluconate Cloth  6 each Topical Daily  . collagenase   Topical Daily  . cyclobenzaprine  10 mg Oral QHS  . doxycycline  100 mg Oral Q12H  . folic acid  1 mg Oral Daily  . gabapentin  300 mg Oral TID  . Gerhardt's butt cream   Topical QID  . insulin aspart  0-15 Units Subcutaneous TID WC  . insulin aspart  0-5 Units Subcutaneous QHS  . insulin aspart protamine- aspart  18 Units Subcutaneous BID WC  . levETIRAcetam  500 mg Oral BID  . mouth rinse  15 mL Mouth Rinse BID  . multivitamin with minerals  1 tablet Oral Daily  . nystatin cream   Topical BID  . oxybutynin  5 mg Oral QHS  . pantoprazole  40 mg Oral QHS  . senna-docusate  1 tablet Oral BID  . thiamine  100 mg Oral Daily   PRN Meds: acetaminophen, albuterol, hydrALAZINE, ondansetron (ZOFRAN) IV, sodium chloride flush, zolpidem  Time spent: 30 minutes  Author: Barton Dubois, MD [facility] Pager: [phone] 06/03/2017 9:50 AM  If 7PM-7AM, please contact night-coverage at [email or web address], password [facility]

## 2017-06-04 DIAGNOSIS — Z87828 Personal history of other (healed) physical injury and trauma: Secondary | ICD-10-CM | POA: Clinically undetermined

## 2017-06-04 DIAGNOSIS — F17200 Nicotine dependence, unspecified, uncomplicated: Secondary | ICD-10-CM

## 2017-06-04 DIAGNOSIS — E1122 Type 2 diabetes mellitus with diabetic chronic kidney disease: Secondary | ICD-10-CM

## 2017-06-04 DIAGNOSIS — S81809A Unspecified open wound, unspecified lower leg, initial encounter: Secondary | ICD-10-CM | POA: Clinically undetermined

## 2017-06-04 DIAGNOSIS — I610 Nontraumatic intracerebral hemorrhage in hemisphere, subcortical: Secondary | ICD-10-CM

## 2017-06-04 DIAGNOSIS — Z794 Long term (current) use of insulin: Secondary | ICD-10-CM

## 2017-06-04 DIAGNOSIS — S81809S Unspecified open wound, unspecified lower leg, sequela: Secondary | ICD-10-CM

## 2017-06-04 LAB — BASIC METABOLIC PANEL
Anion gap: 5 (ref 5–15)
BUN: 9 mg/dL (ref 6–20)
CO2: 25 mmol/L (ref 22–32)
CREATININE: 1.67 mg/dL — AB (ref 0.44–1.00)
Calcium: 8.2 mg/dL — ABNORMAL LOW (ref 8.9–10.3)
Chloride: 110 mmol/L (ref 101–111)
GFR, EST AFRICAN AMERICAN: 37 mL/min — AB (ref >60)
GFR, EST NON AFRICAN AMERICAN: 32 mL/min — AB (ref >60)
Glucose, Bld: 208 mg/dL — ABNORMAL HIGH (ref 65–99)
POTASSIUM: 3.5 mmol/L (ref 3.5–5.1)
SODIUM: 140 mmol/L (ref 135–145)

## 2017-06-04 LAB — GLUCOSE, CAPILLARY
Glucose-Capillary: 210 mg/dL — ABNORMAL HIGH (ref 65–99)
Glucose-Capillary: 214 mg/dL — ABNORMAL HIGH (ref 65–99)

## 2017-06-04 MED ORDER — INSULIN ASPART PROT & ASPART (70-30 MIX) 100 UNIT/ML ~~LOC~~ SUSP: 20.0000 [IU] | Freq: Two times a day (BID) | SUBCUTANEOUS | Status: DC

## 2017-06-04 MED ORDER — THIAMINE HCL 100 MG PO TABS: 100.0000 mg | ORAL_TABLET | Freq: Every day | ORAL | 0 refills | Status: DC

## 2017-06-04 MED ORDER — PANTOPRAZOLE SODIUM 40 MG PO TBEC: 40.0000 mg | DELAYED_RELEASE_TABLET | Freq: Every day | ORAL | Status: DC

## 2017-06-04 MED ORDER — NYSTATIN 100000 UNIT/GM EX CREA: TOPICAL_CREAM | Freq: Two times a day (BID) | CUTANEOUS | 0 refills | Status: DC

## 2017-06-04 MED ORDER — FOLIC ACID 1 MG PO TABS: 1.0000 mg | ORAL_TABLET | Freq: Every day | ORAL | 0 refills | Status: DC

## 2017-06-04 MED ORDER — COLLAGENASE 250 UNIT/GM EX OINT: TOPICAL_OINTMENT | Freq: Every day | CUTANEOUS | 0 refills | Status: DC

## 2017-06-04 MED ORDER — DOXYCYCLINE HYCLATE 100 MG PO TABS: 100.0000 mg | ORAL_TABLET | Freq: Two times a day (BID) | ORAL | 0 refills | Status: AC

## 2017-06-04 NOTE — Care Management Note (Addendum)
Case Management Note  Patient Details  Name: [name] MRN: [id number] Date of Birth: 04/10/1956  Subjective/Objective:                 Admitted with DKA / acute encephalopathy, was found to have thalamic hemorrhage. States  resides with [name].  Royston Bake (Daughter) [facility] [phone]      PCP:  Nolene Ebbs  Action/Plan: Plan is to d/c to home today. [facility] to provide pt's transportation to home when pt is ready.  Expected Discharge Date:      06/04/2017            Expected Discharge Plan:  [place] (husband))  In-House Referral:  Clinical Social Work(SNF, however, pt declined SNF placement)  Discharge planning Services  CM Consult  Post Acute Care Choice:  Resumption of Svcs/PTA Provider Choice offered to:  Patient  DME Arranged:  ( owns Social research officer, government) DME Agency:     HH Arranged:  RN, PT, Social Work [employer] Agency:  [facility]  Status of Service:  Completed, signed off  If discussed at [employer], dates discussed:    Additional Comments:  Sharin Mons, RN 06/04/2017, 11:02 AM

## 2017-06-04 NOTE — Progress Notes (Signed)
PT Cancellation Note  Patient Details Name: [name] MRN: [id number] DOB: 13-Oct-1955   Cancelled Treatment:    Reason Eval/Treat Not Completed: Patient declined, no reason specified, pt reported she "could not be bothered" right now. Encouraged her in the benefits of mobility and she said she would try to get up tomorrow.    [place] 06/04/2017, 2:35 PM

## 2017-06-04 NOTE — Progress Notes (Signed)
Inpatient Diabetes Program Recommendations  AACE/ADA: New Consensus Statement on Inpatient Glycemic Control (2015)  Target Ranges:  Prepandial:   less than 140 mg/dL      Peak postprandial:   less than 180 mg/dL (1-2 hours)      Critically ill patients:  140 - 180 mg/dL   Lab Results  Component Value Date   GLUCAP 210 (H) 06/04/2017   HGBA1C 14.6 (H) 05/26/2017    Review of Glycemic ControlResults for [name], [name] (MRN [id number]) as of 06/04/2017 10:39  Ref. Range 06/03/2017 07:47 06/03/2017 12:22 06/03/2017 16:40 06/03/2017 22:07 06/04/2017 08:13  Glucose-Capillary Latest Ref Range: 65 - 99 mg/dL 172 (H) 156 (H) 325 (H) 245 (H) 210 (H)   Inpatient Diabetes Program Recommendations:    Consider further increase of 70/30 to 20 units bid.  Thanks, Adah Perl, RN, BC-ADM Inpatient Diabetes Coordinator Pager [phone] (8a-5p)

## 2017-06-04 NOTE — Discharge Summary (Signed)
Physician Discharge Summary  [name] GUY:[id number] DOB: Jun 26, 1956 DOA: 05/25/2017  PCP: Nolene Ebbs, MD  Admit date: 05/25/2017 Discharge date: 06/04/2017  Admitted From: Home Disposition:  Home. Patient refused nursing home placement  Recommendations for Outpatient Follow-up:  1. Follow up with PCP in 1week in a week with repeat CBC/BMP 2. Follow-up with neurology as an outpatient 3. Follow-up with wound clinic as an outpatient in a week/earliest convenience 4. Follow-up with cardiology as an outpatient 5. Avoid cocaine  Home Health: Yes  Equipment/Devices:None  Discharge Condition: Guarded  CODE STATUS: Full  Diet recommendation: Heart Healthy / as per SLP recommendations   Brief/Interim Summary: 61 year old female with history of hypertension, diabetes, cocaine abuse, tobacco abuse, hepatitis C, alcohol abuse, seizure disorder, chronic diastolic CHF, pontine ICH with right-sided hemiparesis, lacunar infarct was admitted on 05/25/2017 with acute encephalopathy and was found to have acute thalamic hemorrhage. Neurology was following and has signed off. PT recommended nursing home placement patient is refusing nursing home placement. She'll be discharged home with home care. Avoid cocaine.  Discharge Diagnoses:  Principal Problem:   DKA (diabetic ketoacidoses) ([place]) Active Problems:   Alcohol abuse   TOBACCO ABUSE   Essential hypertension   GERD   Acute renal failure superimposed on stage 3 chronic kidney disease (HCC)   Polysubstance abuse (HCC)   Type II diabetes mellitus with renal manifestations (HCC)   SIRS (systemic inflammatory response syndrome) (HCC)   Acute metabolic encephalopathy   Intracranial bleed (HCC)   ICH (intracerebral hemorrhage) ([place])   H/O open leg wound   Open leg wound  1. Acute Thalamic Hemorrhage:possibly due to cocaine abuse in setting of HTN emergency, history of  CVA,Seizure, alcohol abuse - appreciate Neurology consult,  initially was on antiplatelet medication currently on none due to hemorrhagic stroke.. -continue home dose of Keppra; no seizure appreciated. -continue folate and thiamine  -Neurology currently signed off, outpatient follow-up with [facility]y since patient is discharged from [facility] neurology. - patient advised to quit using recreational drugs. - PT recommended nursing home placement; patient refused. She'll be discharged home with home care  2. DKA:  -resolved and has remained fairly well controlled currently -advise not to skip meals and to be compliant with low carb diet -Outpatient follow-up with primary care provider. Continue 70/30 insulin  3. Hx Tobacco abuse/polysubstance abuse including cocaine -cessation counseling provided -per neurology no nicotine patch for now  4. Hypertensive Emergency Chronic diastolic CHF -BP is stable and well controlled currently -continue holding lisinopril due to AKI -Continue amlodipine for blood pressure control.   -no B-blocker in setting of active use of cocaine -Outpatient follow-up - Heart failure compensated currently. Outpatient follow-up with cardiology  5. DGL:OVFIEPPIRJ 2.59 on admission, up from baseline of 0.96; -foley cath removed -urinating on her own and reporting good urine output  -Creatinine 1.67 today. Outpatient follow-up BMP with primary care provider in a week -Vancomycin has been discontinued at this moment.  6. Multiple Excoriations and Ulcerations with questionable secondary bacterial infection: - excoriations on panus and perineum area with multiple small pustules in periphery; question bacterial super-infection.-Nystatin cream; and has finished  diflucan 100 mg daily for 5 days -sacral ulcer and unstageable calf ulcer: Outpatient wound care follow-up -Treated with IV vancomycin for 6 days; switched to oral doxycycline on 06/03/2017, Continue doxycycline for 6 more days     -WBC's trended down to  normal range and patient has remained afebrile    Discharge Instructions  Discharge Instructions  Ambulatory referral to Cardiology   Complete by:  As directed    CHF; 1-2 weeks follow up   Ambulatory referral to Neurology   Complete by:  As directed    Pt will follow up with [facility] in about 4 weeks. Thanks.   Ambulatory referral to Wound Clinic   Complete by:  As directed    Chronic leg wound; follow up at earliest convenience     Allergies as of 06/04/2017   No Known Allergies     Medication List    STOP taking these medications   aspirin 81 MG chewable tablet   labetalol 200 MG tablet Commonly known as:  NORMODYNE   lisinopril 10 MG tablet Commonly known as:  PRINIVIL,ZESTRIL   nicotine 21 mg/24hr patch Commonly known as:  NICODERM CQ - dosed in mg/24 hours     TAKE these medications   acetaminophen 325 MG tablet Commonly known as:  TYLENOL Take 2 tablets (650 mg total) every 6 (six) hours as needed by mouth for mild pain, fever or headache.   amLODipine 10 MG tablet Commonly known as:  NORVASC Take 1 tablet (10 mg total) daily by mouth.   atorvastatin 40 MG tablet Commonly known as:  LIPITOR Take 1 tablet (40 mg total) daily at 6 PM by mouth.   collagenase ointment Commonly known as:  SANTYL Apply topically daily. Apply Santyl to right leg wound Q day, then cover with moist gauze and dry gauze and kerlex Start taking on:  06/05/2017   cyclobenzaprine 10 MG tablet Commonly known as:  FLEXERIL Take 1 tablet (10 mg total) at bedtime by mouth.   doxycycline 100 MG tablet Commonly known as:  VIBRA-TABS Take 1 tablet (100 mg total) by mouth every 12 (twelve) hours for 6 days.   folic acid 1 MG tablet Commonly known as:  FOLVITE Take 1 tablet (1 mg total) by mouth daily. Start taking on:  06/05/2017   gabapentin 300 MG capsule Commonly known as:  NEURONTIN Take 1 capsule (300 mg total) 3 (three) times daily by mouth.   insulin aspart protamine-  aspart (70-30) 100 UNIT/ML injection Commonly known as:  NOVOLOG MIX 70/30 Inject 0.2 mLs (20 Units total) into the skin 2 (two) times daily with a meal. What changed:  how much to take   levETIRAcetam 500 MG tablet Commonly known as:  KEPPRA Take 1 tablet (500 mg total) 2 (two) times daily by mouth.   multivitamin with minerals Tabs tablet Take 1 tablet by mouth daily.   nystatin cream Commonly known as:  MYCOSTATIN Apply topically 2 (two) times daily.   oxybutynin 5 MG 24 hr tablet Commonly known as:  DITROPAN-XL Take 1 tablet (5 mg total) at bedtime by mouth.   pantoprazole 40 MG tablet Commonly known as:  PROTONIX Take 1 tablet (40 mg total) by mouth daily. What changed:  when to take this   PROAIR HFA 108 (90 Base) MCG/ACT inhaler Generic drug:  albuterol Inhale 2 puffs into the lungs 4 (four) times daily as needed for wheezing.   thiamine 100 MG tablet Take 1 tablet (100 mg total) by mouth daily. Start taking on:  06/05/2017      Follow-up Information    [facility]. Schedule an appointment as soon as possible for a visit in 6 week(s).   Contact information: [place], [place] [phone]       Nolene Ebbs, MD. Schedule an appointment as soon as possible  for a visit in 1 week(s).   Specialty:  Internal Medicine Why:  with repeat CBC/BMP Contact information: [place] [place] [phone]          No Known Allergies  Consultations: neurology, PCCM       Procedures/Studies: Dg Chest 2 View  Result Date: 05/25/2017 CLINICAL DATA:  Tachycardia and altered mental status EXAM: CHEST  2 VIEW COMPARISON:  05/10/2017 FINDINGS: Cardiac shadow is mildly enlarged but stable. Aortic calcifications are again seen. The lungs are clear bilaterally. Degenerative changes of the right shoulder joint noted. IMPRESSION: No acute abnormality noted. Electronically Signed   By: Inez Catalina  M.D.   On: 05/25/2017 20:52   Dg Chest 2 View  Result Date: 05/10/2017 CLINICAL DATA:  Hyperglycemia with confusion. Nausea and vomiting. Cough. EXAM: CHEST  2 VIEW COMPARISON:  October 31, 2016 and January 14, 2016 FINDINGS: There is a focal area of increased opacity in the right base, felt to represent a small focus of pneumonia. There is chronic scarring in the lingula anteriorly. Lungs elsewhere clear. Heart is upper normal in size with pulmonary vascularity within normal limits. There is aortic atherosclerosis. No adenopathy. There is degenerative change in the right shoulder. IMPRESSION: Small area of presumed pneumonia in the right base. Scarring anterior lingula. Lungs elsewhere clear. Heart upper normal in size. Aortic atherosclerosis present. Aortic Atherosclerosis (ICD10-I70.0). Followup PA and lateral chest radiographs recommended in 3-4 weeks following trial of antibiotic therapy to ensure resolution and exclude underlying malignancy. Electronically Signed   By: Lowella Grip III M.D.   On: 05/10/2017 12:51   Ct Head Wo Contrast  Result Date: 05/26/2017 CLINICAL DATA:  61 year old female with altered level of consciousness and recent intracranial hemorrhage. EXAM: CT HEAD WITHOUT CONTRAST TECHNIQUE: Contiguous axial images were obtained from the base of the skull through the vertex without intravenous contrast. COMPARISON:  Earlier head CT dated 05/26/2017 FINDINGS: Brain: No significant interval change in the size of the left thalamic hemorrhage now measures approximately 14 x 15 mm. There is associated minimal mass effect on the third ventricle similar to prior CT. No midline shift. No new hemorrhage noted. An ill-defined area of high attenuation in the pons (series 3, image 9) appears similar to the earlier CT. The ventricles and sulci appropriate size for patient's age. Mild periventricular and deep white matter chronic microvascular ischemic changes noted. No extra-axial fluid collection.  Vascular: No hyperdense vessel or unexpected calcification. Skull: Normal. Negative for fracture or focal lesion. Sinuses/Orbits: No acute finding. Other: None IMPRESSION: 1. No significant interval change in the size of the left thalamic hemorrhage. No new hemorrhage. 2. Ill-defined area of high attenuation in the pons similar to prior CT, and may be artifactual. Electronically Signed   By: Anner Crete M.D.   On: 05/26/2017 18:20   Ct Head Wo Contrast  Addendum Date: 05/26/2017   ADDENDUM REPORT: 05/26/2017 07:32 ADDENDUM: Dr. Blaine Hamper was no longer available. The results were called to Dr. Vanita Panda at 7:30 a.m. Electronically Signed   By: Rolm Baptise M.D.   On: 05/26/2017 07:32   Result Date: 05/26/2017 CLINICAL DATA:  Altered mental status EXAM: CT HEAD WITHOUT CONTRAST TECHNIQUE: Contiguous axial images were obtained from the base of the skull through the vertex without intravenous contrast. COMPARISON:  05/10/2017 FINDINGS: Brain: There is an area of acute hemorrhage within the left thalamus measuring 1.7 x 1.3 cm, new since prior study. Mild atrophy and chronic small vessel disease changes. Old lacunar  infarct in the right internal capsule. No hydrocephalus. Vascular: No hyperdense vessel or unexpected calcification. Skull: No acute calvarial abnormality. Sinuses/Orbits: Visualized paranasal sinuses and mastoids clear. Orbital soft tissues unremarkable. Other: None IMPRESSION: Area of acute hemorrhage within the left thalamus measures 1.7 x 1.3 cm. Atrophy, chronic small vessel disease. Critical Value/emergent results were called by telephone at the time of interpretation on 05/26/2017 at 7:23 am to Dr. Ivor Costa , who verbally acknowledged these results. Electronically Signed: By: Rolm Baptise M.D. On: 05/26/2017 07:27   Ct Head Wo Contrast  Result Date: 05/10/2017 CLINICAL DATA:  Mental status changes.  Recent stroke. EXAM: CT HEAD WITHOUT CONTRAST TECHNIQUE: Contiguous axial images were  obtained from the base of the skull through the vertex without intravenous contrast. COMPARISON:  10/31/2016.  MRI 11/02/2016. FINDINGS: Brain: There is no evidence for acute hemorrhage, hydrocephalus, mass lesion, or abnormal extra-axial fluid collection. No definite CT evidence for acute infarction. Diffuse loss of parenchymal volume is consistent with atrophy. Patchy low attenuation in the deep hemispheric and periventricular white matter is nonspecific, but likely reflects chronic microvascular ischemic demyelination. Vascular: No hyperdense vessel or unexpected calcification. Skull: No evidence for fracture. No worrisome lytic or sclerotic lesion. Sinuses/Orbits: The visualized paranasal sinuses and mastoid air cells are clear. Visualized portions of the globes and intraorbital fat are unremarkable. Other: None. IMPRESSION: 1. No acute intracranial abnormality. 2. Atrophy with chronic small vessel white matter ischemic disease. Electronically Signed   By: Misty Stanley M.D.   On: 05/10/2017 18:09   Ct Chest Wo Contrast  Result Date: 05/10/2017 CLINICAL DATA:  61 year old female with history of diabetes presenting with several days of emesis. Concern for aspiration. EXAM: CT CHEST, ABDOMEN AND PELVIS WITHOUT CONTRAST TECHNIQUE: Multidetector CT imaging of the chest, abdomen and pelvis was performed following the standard protocol without IV contrast. COMPARISON:  Chest radiograph dated 05/10/2017 FINDINGS: Evaluation of this exam is limited in the absence of intravenous contrast. CT CHEST FINDINGS Cardiovascular: Are there is no cardiomegaly or pericardial effusion. Multi vessel coronary vascular calcification with involvement of the LAD, RCA, and left circumflex artery. There is moderate atherosclerotic calcification of the thoracic aorta. There is a mild dilatation of the main pulmonary trunk suggestive of underlying pulmonary hypertension. Evaluation of the vasculature is limited in the absence of  intravenous contrast. Mediastinum/Nodes: There is no hilar or mediastinal adenopathy. There is a small hiatal hernia. The esophagus appears thickened with small amount of debris. Findings may be reactive and represent inflammatory changes secondary to recurrent vomiting. Underlying esophageal infiltrative process is less likely but not entirely excluded. No mediastinal fluid collection or hematoma. No evidence of pneumomediastinum. Lungs/Pleura: The lungs are clear. There is no pleural effusion or pneumothorax. Minimal linear atelectatic changes of the left upper lobe noted. The central airways are patent. Musculoskeletal: There is degenerative changes of the spine shoulder. No acute osseous pathology. Focal area of apparent cortical discontinuity of the mid sternal body, likely artifactual and related to motion artifact. Correlation with clinical exam and point tenderness recommended. CT ABDOMEN PELVIS FINDINGS No intra-abdominal free air or free fluid. Hepatobiliary: No focal liver abnormality is seen. No gallstones, gallbladder wall thickening, or biliary dilatation. Pancreas: Unremarkable. No pancreatic ductal dilatation or surrounding inflammatory changes. Spleen: Normal in size without focal abnormality. Adrenals/Urinary Tract: The adrenal glands are unremarkable. There is no hydronephrosis or obstructing stone on either side. Multiple small calcific densities in the right renal hilum most consistent with renovascular calcification. The visualized ureters and  urinary bladder appear unremarkable. Stomach/Bowel: There is colonic diverticulosis without active inflammatory changes. There is no evidence of bowel obstruction or active inflammation. Normal appendix. Vascular/Lymphatic: There is advanced aortoiliac atherosclerotic disease. No portal venous gas identified. There is no adenopathy. Reproductive: The uterus is grossly unremarkable. No pelvic mass. The ovaries appear unremarkable as visualized. Other:  Small fat containing umbilical hernia. Musculoskeletal: There is osteopenia with degenerative changes of the spine and hips. No acute osseous pathology. IMPRESSION: 1. Mild thickened appearance of the esophagus which may represent reactive esophagitis related to recurrent or vomiting. Clinical correlation is recommended. No other acute intrathoracic, abdominal, or pelvic pathology. Specifically there is no evidence of aspiration. 2. No hydronephrosis or nephrolithiasis. 3. Scattered colonic diverticula. No bowel obstruction or active inflammation. Normal appendix. 4.  Aortic Atherosclerosis (ICD10-I70.0). Electronically Signed   By: Anner Crete M.D.   On: 05/10/2017 18:32   Dg Swallowing Func-speech Pathology  Result Date: 06/04/2017 Completed by Aaron Edelman, SLP student, Supervised and reviewed by Herbie Baltimore MA CCC-SLP Objective Swallowing Evaluation: Type of Study: MBS-Modified Barium Swallow Study  Patient Details Name: Grizel Vesely MRN: [id number] Date of Birth: Jul 21, 1955 Today's Date: 06/04/2017 Time: SLP Start Time (ACUTE ONLY): 1029 -SLP Stop Time (ACUTE ONLY): 1048 SLP Time Calculation (min) (ACUTE ONLY): 19 min Past Medical History: Past Medical History: Diagnosis Date . Cocaine abuse, continuous ([place])  . Diabetes mellitus   Hb A1C = 12.6 on 05/15/11, managed on Novolog 70/30, 35 U qam, 25 U qpm . Hypertension   poorly controlled . Insomnia disorder  . Shortness of breath  Past Surgical History: Past Surgical History: Procedure Laterality Date . ANKLE FRACTURE SURGERY  2007 HPI: Kechia Prisk is a 61 y.o. female with with a history of CVA, hypertension, cocaine abuse, diabetes who presented with at least a 1 day history of altered mental status.  She came into the ER yesterday at 6 PM, and was found to be in DKA, AK I, leukocytosis, and was felt that these issues were contributing to a mild encephalopathy.  As part of the workup for the encephalopathy a routine CT was ordered which  demonstrates a  left thalamic hemorrhage 1.7 x 1.3 cm. 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  Subjective: Pt in bed, fluctuating alertness Assessment / Plan / Recommendation CHL IP CLINICAL IMPRESSIONS 05/29/2017 Clinical Impression --Pt demonstrates mild oropharyngeal dysphagia marked by flash penetration  and premature spillage to the level of the pyriforms across consistencies. Laryngeal elevation and airway protection timely; however, anterior hyolaryngeal excursion occurred with the bolus sitting in the pyriform sinus. Bony protrusions of the spinal column observed on cervical verterbrae 3 and 4 with no adverse effects on swallow noted. Esophageal sweep performed with solid and pill consistencies; bolus successfully transited through esophagus with both presentations. Pt demonstrates consistent coughing in room per RN however, no coughing observed during study; decrease in coughing may be due to postural changes. Recommend Dys 3 diet with thin liquids and meds whole in puree. Sign created for [facility] including recommendations and safety precautions; pt to take small bites/sips, sit upright for meals and clear throat intermittently. Will follow-up at bedside x1 for diet tolerance before signing off.  SLP Visit Diagnosis Dysphagia, unspecified (R13.10);Frontal lobe and executive function deficit Attention and concentration deficit following -- Frontal lobe and executive function deficit following Cerebral infarction Impact  on safety and function --   CHL IP TREATMENT RECOMMENDATION 05/28/2017 Treatment Recommendations Therapy as outlined in treatment plan below   Prognosis 05/28/2017 Prognosis for Safe Diet Advancement Good Barriers to Reach Goals Cognitive deficits Barriers/Prognosis Comment -- CHL IP DIET RECOMMENDATION 05/29/2017 SLP Diet Recommendations -- Liquid  Administration via -- Medication Administration -- Compensations Slow rate;Small sips/bites;Clear throat intermittently Postural Changes --   CHL IP OTHER RECOMMENDATIONS 05/28/2017 Recommended Consults -- Oral Care Recommendations Oral care BID Other Recommendations --   CHL IP FOLLOW UP RECOMMENDATIONS 05/29/2017 Follow up Recommendations None   CHL IP FREQUENCY AND DURATION 05/28/2017 Speech Therapy Frequency (ACUTE ONLY) min 1 x/week Treatment Duration 1 week      CHL IP ORAL PHASE 05/28/2017 Oral Phase Impaired Oral - Pudding Teaspoon -- Oral - Pudding Cup -- Oral - Honey Teaspoon -- Oral - Honey Cup -- Oral - Nectar Teaspoon -- Oral - Nectar Cup -- Oral - Nectar Straw -- Oral - Thin Teaspoon -- Oral - Thin Cup Premature spillage Oral - Thin Straw Premature spillage Oral - Puree Premature spillage Oral - Mech Soft -- Oral - Regular Premature spillage Oral - Multi-Consistency -- Oral - Pill Premature spillage;Reduced posterior propulsion Oral Phase - Comment --  CHL IP PHARYNGEAL PHASE 05/28/2017 Pharyngeal Phase Impaired Pharyngeal- Pudding Teaspoon -- Pharyngeal -- Pharyngeal- Pudding Cup -- Pharyngeal -- Pharyngeal- Honey Teaspoon -- Pharyngeal -- Pharyngeal- Honey Cup -- Pharyngeal -- Pharyngeal- Nectar Teaspoon -- Pharyngeal -- Pharyngeal- Nectar Cup -- Pharyngeal -- Pharyngeal- Nectar Straw -- Pharyngeal -- Pharyngeal- Thin Teaspoon -- Pharyngeal -- Pharyngeal- Thin Cup Penetration/Aspiration during swallow Pharyngeal Material enters airway, remains ABOVE vocal cords then ejected out Pharyngeal- Thin Straw Penetration/Aspiration during swallow Pharyngeal Material enters airway, remains ABOVE vocal cords then ejected out Pharyngeal- Puree Penetration/Aspiration during swallow Pharyngeal Material enters airway, remains ABOVE vocal cords then ejected out Pharyngeal- Mechanical Soft -- Pharyngeal -- Pharyngeal- Regular Penetration/Aspiration during swallow Pharyngeal Material enters airway, remains ABOVE vocal  cords then ejected out Pharyngeal- Multi-consistency -- Pharyngeal -- Pharyngeal- Pill Penetration/Aspiration during swallow Pharyngeal Material enters airway, remains ABOVE vocal cords then ejected out Pharyngeal Comment --  CHL IP CERVICAL ESOPHAGEAL PHASE 05/28/2017 Cervical Esophageal Phase WFL Pudding Teaspoon -- Pudding Cup -- Honey Teaspoon -- Honey Cup -- Nectar Teaspoon -- Nectar Cup -- Nectar Straw -- Thin Teaspoon -- Thin Cup -- Thin Straw -- Puree -- Mechanical Soft -- Regular -- Multi-consistency -- Pill -- Cervical Esophageal Comment -- No flowsheet data found. DeBlois, Katherene Ponto 06/04/2017, 8:00 AM              Ct Renal Stone Study  Result Date: 05/10/2017 CLINICAL DATA:  61 year old female with history of diabetes presenting with several days of emesis. Concern for aspiration. EXAM: CT CHEST, ABDOMEN AND PELVIS WITHOUT CONTRAST TECHNIQUE: Multidetector CT imaging of the chest, abdomen and pelvis was performed following the standard protocol without IV contrast. COMPARISON:  Chest radiograph dated 05/10/2017 FINDINGS: Evaluation of this exam is limited in the absence of intravenous contrast. CT CHEST FINDINGS Cardiovascular: Are there is no cardiomegaly or pericardial effusion. Multi vessel coronary vascular calcification with involvement of the LAD, RCA, and left circumflex artery. There is moderate atherosclerotic calcification of the thoracic aorta. There is a mild dilatation of the main pulmonary trunk suggestive of underlying pulmonary hypertension. Evaluation of the vasculature is limited in the absence of intravenous contrast. Mediastinum/Nodes: There is no hilar or mediastinal adenopathy. There is a small hiatal hernia. The esophagus appears thickened with  small amount of debris. Findings may be reactive and represent inflammatory changes secondary to recurrent vomiting. Underlying esophageal infiltrative process is less likely but not entirely excluded. No mediastinal fluid  collection or hematoma. No evidence of pneumomediastinum. Lungs/Pleura: The lungs are clear. There is no pleural effusion or pneumothorax. Minimal linear atelectatic changes of the left upper lobe noted. The central airways are patent. Musculoskeletal: There is degenerative changes of the spine shoulder. No acute osseous pathology. Focal area of apparent cortical discontinuity of the mid sternal body, likely artifactual and related to motion artifact. Correlation with clinical exam and point tenderness recommended. CT ABDOMEN PELVIS FINDINGS No intra-abdominal free air or free fluid. Hepatobiliary: No focal liver abnormality is seen. No gallstones, gallbladder wall thickening, or biliary dilatation. Pancreas: Unremarkable. No pancreatic ductal dilatation or surrounding inflammatory changes. Spleen: Normal in size without focal abnormality. Adrenals/Urinary Tract: The adrenal glands are unremarkable. There is no hydronephrosis or obstructing stone on either side. Multiple small calcific densities in the right renal hilum most consistent with renovascular calcification. The visualized ureters and urinary bladder appear unremarkable. Stomach/Bowel: There is colonic diverticulosis without active inflammatory changes. There is no evidence of bowel obstruction or active inflammation. Normal appendix. Vascular/Lymphatic: There is advanced aortoiliac atherosclerotic disease. No portal venous gas identified. There is no adenopathy. Reproductive: The uterus is grossly unremarkable. No pelvic mass. The ovaries appear unremarkable as visualized. Other: Small fat containing umbilical hernia. Musculoskeletal: There is osteopenia with degenerative changes of the spine and hips. No acute osseous pathology. IMPRESSION: 1. Mild thickened appearance of the esophagus which may represent reactive esophagitis related to recurrent or vomiting. Clinical correlation is recommended. No other acute intrathoracic, abdominal, or pelvic  pathology. Specifically there is no evidence of aspiration. 2. No hydronephrosis or nephrolithiasis. 3. Scattered colonic diverticula. No bowel obstruction or active inflammation. Normal appendix. 4.  Aortic Atherosclerosis (ICD10-I70.0). Electronically Signed   By: Anner Crete M.D.   On: 05/10/2017 18:32    Echo on 05/29/2017 Study Conclusions  - Left ventricle: The cavity size was normal. There was mild   concentric hypertrophy. Systolic function was normal. The   estimated ejection fraction was in the range of 55% to 60%. Wall   motion was normal; there were no regional wall motion   abnormalities. Doppler parameters are consistent with abnormal   left ventricular relaxation (grade 1 diastolic dysfunction). - Mitral valve: Calcified annulus. Valve area by pressure   half-time: 2.12 cm^2. Valve area by continuity equation (using   LVOT flow): 2.34 cm^2. - Atrial septum: No defect or patent foramen ovale was identified.  Impressions:  - There was no evidence of a vegetation.  Recommendations:  Consider transesophageal echocardiography if clinically indicated in order to exclude endocarditis.   Subjective: Patient seen and examined at bedside. She denies any overnight fever, nausea or vomiting or chest pain.  Discharge Exam: Vitals:   06/03/17 2209 06/04/17 0504  BP: 140/69 (!) 156/72  Pulse: 99 (!) 103  Resp: 18 18  Temp: 98.5 F (36.9 C) 98.9 F (37.2 C)  SpO2: 99% 97%   Vitals:   06/03/17 0813 06/03/17 1426 06/03/17 2209 06/04/17 0504  BP:  136/68 140/69 (!) 156/72  Pulse:  (!) 106 99 (!) 103  Resp:  18 18 18   Temp:  99.5 F (37.5 C) 98.5 F (36.9 C) 98.9 F (37.2 C)  TempSrc:  Oral Oral Oral  SpO2: 100% 94% 99% 97%  Weight:    94.3 kg (207 lb 14.3 oz)  Height:  General: Pt is alert, awake, not in acute distress Cardiovascular: Slightly tachycardic, S1/S2 + Respiratory: Bilateral decreased breath sounds at bases  Abdominal: Soft, NT, ND,  bowel sounds + Extremities: Trace edema, no cyanosis; right lower extremity dressing present    The results of significant diagnostics from this hospitalization (including imaging, microbiology, ancillary and laboratory) are listed below for reference.     Microbiology: Recent Results (from the past 240 hour(s))  Blood culture (routine x 2)     Status: None   Collection Time: 05/25/17  7:31 PM  Result Value Ref Range Status   Specimen Description BLOOD  Final   Special Requests IN PEDIATRIC BOTTLE Blood Culture adequate volume  Final   Culture NO GROWTH 5 DAYS  Final   Report Status 05/31/2017 FINAL  Final  Blood culture (routine x 2)     Status: None   Collection Time: 05/25/17  8:25 PM  Result Value Ref Range Status   Specimen Description BLOOD RIGHT HAND  Final   Special Requests IN PEDIATRIC BOTTLE Blood Culture adequate volume  Final   Culture NO GROWTH 5 DAYS  Final   Report Status 05/30/2017 FINAL  Final  Urine culture     Status: None   Collection Time: 05/25/17  9:35 PM  Result Value Ref Range Status   Specimen Description URINE, RANDOM  Final   Special Requests NONE  Final   Culture NO GROWTH  Final   Report Status 05/27/2017 FINAL  Final  MRSA PCR Screening     Status: Abnormal   Collection Time: 05/26/17 10:14 AM  Result Value Ref Range Status   MRSA by PCR POSITIVE (A) NEGATIVE Final    Comment:        The GeneXpert MRSA Assay (FDA approved for NASAL specimens only), is one component of a comprehensive MRSA colonization surveillance program. It is not intended to diagnose MRSA infection nor to guide or monitor treatment for MRSA infections. RESULT CALLED TO, READ BACK BY AND VERIFIED WITH: [facility] RN AT 1345 05/26/17 BY A.DAVIS      Labs: BNP (last 3 results) Recent Labs    05/25/17 1955  BNP 95.0   Basic Metabolic Panel: Recent Labs  Lab 05/30/17 0448 05/31/17 0359 06/01/17 0403 06/02/17 0515 06/03/17 0349 06/04/17 0401  NA 138 138 140  139 137 140  K 3.4* 4.3 3.5 3.5 3.3* 3.5  CL 110 112* 111 108 106 110  CO2 21* 21* 22 23 24 25   GLUCOSE 208* 192* 175* 106* 163* 208*  BUN 6 6 7  <5* 7 9  CREATININE 1.12* 1.22* 1.26* 1.29* 1.59* 1.67*  CALCIUM 8.1* 8.4* 8.5* 8.6* 8.4* 8.2*  MG 1.7 1.7  --   --   --   --   PHOS  --  3.9  --   --   --   --    Liver Function Tests: No results for input(s): AST, ALT, ALKPHOS, BILITOT, PROT, ALBUMIN in the last 168 hours. No results for input(s): LIPASE, AMYLASE in the last 168 hours. No results for input(s): AMMONIA in the last 168 hours. CBC: Recent Labs  Lab 05/30/17 0448 06/01/17 0403  WBC 9.0 10.0  HGB 10.7* 10.3*  HCT 32.2* 31.1*  MCV 91.0 92.0  PLT 209 269   Cardiac Enzymes: No results for input(s): CKTOTAL, CKMB, CKMBINDEX, TROPONINI in the last 168 hours. BNP: Invalid input(s): POCBNP CBG: Recent Labs  Lab 06/03/17 0747 06/03/17 1222 06/03/17 1640 06/03/17 2207 06/04/17 0813  GLUCAP 172*  156* 325* 245* 210*   D-Dimer No results for input(s): DDIMER in the last 72 hours. Hgb A1c No results for input(s): HGBA1C in the last 72 hours. Lipid Profile No results for input(s): CHOL, HDL, LDLCALC, TRIG, CHOLHDL, LDLDIRECT in the last 72 hours. Thyroid function studies No results for input(s): TSH, T4TOTAL, T3FREE, THYROIDAB in the last 72 hours.  Invalid input(s): FREET3 Anemia work up No results for input(s): VITAMINB12, FOLATE, FERRITIN, TIBC, IRON, RETICCTPCT in the last 72 hours. Urinalysis    Component Value Date/Time   COLORURINE YELLOW 05/26/2017 1026   APPEARANCEUR CLEAR 05/26/2017 1026   LABSPEC 1.014 05/26/2017 1026   PHURINE 5.0 05/26/2017 1026   GLUCOSEU 150 (A) 05/26/2017 1026   GLUCOSEU > 1000 mg/dL (A) 11/01/2007 2056   HGBUR MODERATE (A) 05/26/2017 1026   HGBUR negative 07/15/2007 0954   BILIRUBINUR NEGATIVE 05/26/2017 1026   KETONESUR 20 (A) 05/26/2017 1026   PROTEINUR 100 (A) 05/26/2017 1026   UROBILINOGEN 1.0 04/09/2012 1524   NITRITE  NEGATIVE 05/26/2017 1026   LEUKOCYTESUR NEGATIVE 05/26/2017 1026   Sepsis Labs Invalid input(s): PROCALCITONIN,  WBC,  LACTICIDVEN Microbiology Recent Results (from the past 240 hour(s))  Blood culture (routine x 2)     Status: None   Collection Time: 05/25/17  7:31 PM  Result Value Ref Range Status   Specimen Description BLOOD  Final   Special Requests IN PEDIATRIC BOTTLE Blood Culture adequate volume  Final   Culture NO GROWTH 5 DAYS  Final   Report Status 05/31/2017 FINAL  Final  Blood culture (routine x 2)     Status: None   Collection Time: 05/25/17  8:25 PM  Result Value Ref Range Status   Specimen Description BLOOD RIGHT HAND  Final   Special Requests IN PEDIATRIC BOTTLE Blood Culture adequate volume  Final   Culture NO GROWTH 5 DAYS  Final   Report Status 05/30/2017 FINAL  Final  Urine culture     Status: None   Collection Time: 05/25/17  9:35 PM  Result Value Ref Range Status   Specimen Description URINE, RANDOM  Final   Special Requests NONE  Final   Culture NO GROWTH  Final   Report Status 05/27/2017 FINAL  Final  MRSA PCR Screening     Status: Abnormal   Collection Time: 05/26/17 10:14 AM  Result Value Ref Range Status   MRSA by PCR POSITIVE (A) NEGATIVE Final    Comment:        The GeneXpert MRSA Assay (FDA approved for NASAL specimens only), is one component of a comprehensive MRSA colonization surveillance program. It is not intended to diagnose MRSA infection nor to guide or monitor treatment for MRSA infections. RESULT CALLED TO, READ BACK BY AND VERIFIED WITH: [facility] RN AT 1345 05/26/17 BY A.DAVIS      Time coordinating discharge:35 minutes  SIGNED:   Aline August, MD  [facility] 06/04/2017, 12:01 PM Pager: [phone]  If 7PM-7AM, please contact night-coverage [email or web address] Password [email or web address]

## 2017-06-04 NOTE — Progress Notes (Signed)
CSW spoke with pt this morning to inform about bed at [facility]- patient states she needs to speak with family first concerning placement so far away  CSW then spoke with MD who confirms pt ready for DC today  CSW updated patient who is now saying she is unwilling to go to SNF and believes she can manage at home with assistance from family who she states is available 24/7.  Pt reports she was using [facility] prior to admission and they came in daily  CSW updated MD and RNCM that pt now wanting to return home- does not have family who can pick up  [name], [place] Social Worker [phone]

## 2017-06-05 ENCOUNTER — Encounter: Payer: Self-pay | Admitting: Neurology

## 2017-06-07 ENCOUNTER — Other Ambulatory Visit: Payer: Self-pay

## 2017-06-07 NOTE — Patient Outreach (Signed)
[place] [facility]) Care Management  06/07/2017  Aizlynn Acocella 1955/07/27 [id number]   Telephone call to [place]. Home Health Department not available.  CM contact information given for call back.  Plan: RN CM will wait return call from home health.    Jone Baseman, RN, MSN [facility] Care Management Care Management Coordinator Direct Line [phone] Toll Free: [phone]  Fax: [phone]

## 2017-06-07 NOTE — Patient Outreach (Signed)
[facility]) Care Management  06/07/2017  [name] [birth year] [id number]   Telephone call to [place].  Patient start of care date was 06-06-17 for PT, Nurse, and Social Work.   Telephone call to patient. She is able to verify HIPAA.   Asked patient had she heard from home health.  She states that the nurse called today and will be making a visit on tomorrow. Advised patient that Education officer, museum and physical therapist to be involved as well.  She verbalized understanding.   Plan: RN CM will close at this time as patient since home health has made contact with the patient. RN CM will notify care management assistant of case status.     Jone Baseman, RN, MSN [facility] Care Management Care Management Coordinator Direct Line [phone] Toll Free: [phone]  Fax: [phone]

## 2017-06-07 NOTE — Patient Outreach (Signed)
[name]) Care Management  06/07/2017  [name] 1955-11-15 [id number]   EMMI: Stroke Referral Date: 06/07/17 Referral Source: EMMI Referral Reason: Question/problmes with meds-yes Scheduled follow up appointment-no Filled new prescriptions-no Been able to take every dose of medications-no Day # 1   Outreach attempt # 1 Spoke with patient she is able to verify HIPAA.  Reviewed and addressed red alert. Patient reports that she does not have a battery to her meter.  Advised patient that she could take it or have someone take it to the pharmacy and they could assist her with getting a new battery. Se verbalized understanding.  Patient reports she has no scheduled an appointment with her primary care physician yet.  Advised patient that I could assist her. Called Dr. Santiago Bur office while on the call. Patient attempted to schedule an appointment but due to 10 no show appointments patient has to do a walk in.  Patient was provided with walkin days and hours of Tuesday and Thursday 9am to 11am and 2pm-3pm.  She verbalized understanding. Patient has not filled new prescriptions and therefore has not taken medications as prescribed. She states that she will be using [facility] as they deliver medication.     Patient was supposed to have home health from [place] but has not heard from them.  Advised patient that I would call them and get back with her.  She verbalized understanding.     Plan: RN CM will call [facility] for home health status.    Jone Baseman, RN, MSN [facility] Care Management Care Management Coordinator Direct Line [phone] Toll Free: [phone]  Fax: [phone]

## 2017-06-19 ENCOUNTER — Ambulatory Visit: Payer: Self-pay | Admitting: Neurology

## 2017-06-25 ENCOUNTER — Other Ambulatory Visit: Payer: Self-pay | Admitting: Physical Medicine and Rehabilitation

## 2017-07-12 NOTE — Progress Notes (Deleted)
[name] was seen today in neurologic consultation at the request of Dr. Erlinda [name].  Her PCP is [name] Ebbs, MD.  The consultation is for the evaluation of thalamic hemmorhage.  The extensive number of records that were made available to me were reviewed.  Patient is 62 y.o. female with a history of hypertension, cocaine abuse, alcohol abuse, diabetes who presented to the hospital in November with mental status change.  She was found to be in DKA.  Her A1c was 14.6.  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  She was found to have uncontrolled HTN.  In April, 2018, the patient was also admitted to the hospital with hyperosmolar nonketotic state and seizure.  She was placed on Keppra at that time.  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  ENT follow-up was recommended.  The patient has not followed through with that.    The patient is a 62 y.o. year old female with a history of ***.  The first symptom began *** years ago.   Neuroimaging has *** previously been performed.  It *** available for my review today.  PREVIOUS MEDICATIONS: ***  ALLERGIES:  No Known Allergies  CURRENT MEDICATIONS:  Outpatient Encounter Medications as of 07/16/2017  Medication Sig  . acetaminophen (TYLENOL) 325 MG tablet Take 2 tablets (650 mg total) every 6 (six) hours as needed by mouth for mild pain, fever or headache.  Marland Kitchen amLODipine (NORVASC) 10 MG tablet Take 1 tablet (10 mg total) daily by mouth. (Patient not taking: Reported on 05/25/2017)  . atorvastatin (LIPITOR) 40 MG tablet Take 1 tablet (40 mg total) daily at 6 PM by mouth.  . collagenase (SANTYL) ointment Apply topically daily. Apply Santyl to right leg wound Q day, then cover with moist gauze and dry gauze and kerlex  . cyclobenzaprine (FLEXERIL) 10 MG tablet Take 1 tablet (10 mg total) at bedtime by mouth.  . folic acid  (FOLVITE) 1 MG tablet Take 1 tablet (1 mg total) by mouth daily.  Marland Kitchen gabapentin (NEURONTIN) 300 MG capsule Take 1 capsule (300 mg total) 3 (three) times daily by mouth.  . insulin aspart protamine- aspart (NOVOLOG MIX 70/30) (70-30) 100 UNIT/ML injection Inject 0.2 mLs (20 Units total) into the skin 2 (two) times daily with a meal.  . levETIRAcetam (KEPPRA) 500 MG tablet Take 1 tablet (500 mg total) 2 (two) times daily by mouth.  . Multiple Vitamin (MULTIVITAMIN WITH MINERALS) TABS tablet Take 1 tablet by mouth daily.  Marland Kitchen nystatin cream (MYCOSTATIN) Apply topically 2 (two) times daily.  Marland Kitchen oxybutynin (DITROPAN-XL) 5 MG 24 hr tablet Take 1 tablet (5 mg total) at bedtime by mouth.  . pantoprazole (PROTONIX) 40 MG tablet Take 1 tablet (40 mg total) by mouth daily.  Marland Kitchen PROAIR HFA 108 (90 Base) MCG/ACT inhaler Inhale 2 puffs into the lungs 4 (four) times daily as needed for wheezing.  . thiamine 100 MG tablet Take 1 tablet (100 mg total) by mouth daily.   No facility-administered encounter medications on file as of 07/16/2017.     PAST MEDICAL HISTORY:   Past Medical History:  Diagnosis Date  . Cocaine abuse, continuous ([place])   . Diabetes mellitus    Hb A1C = 12.6 on 05/15/11, managed on Novolog 70/30, 35 U qam, 25 U qpm  . Hypertension    poorly controlled  . Insomnia disorder   .  Shortness of breath     PAST SURGICAL HISTORY:   Past Surgical History:  Procedure Laterality Date  . ANKLE FRACTURE SURGERY  2007    SOCIAL HISTORY:   Social History   Socioeconomic History  . Marital status: Single    Spouse name: Not on file  . Number of children: Not on file  . Years of education: Not on file  . Highest education level: Not on file  Social Needs  . Financial resource strain: Not on file  . Food insecurity - worry: Not on file  . Food insecurity - inability: Not on file  . Transportation needs - medical: Not on file  . Transportation needs - non-medical: Not on file  Occupational  History  . Not on file  Tobacco Use  . Smoking status: Current Every Day Smoker    Packs/day: 1.50    Years: 25.00    Pack years: 37.50    Types: Cigarettes  . Smokeless tobacco: Never Used  Substance and Sexual Activity  . Alcohol use: Yes    Alcohol/week: 4.8 oz    Types: 2 Glasses of wine, 6 Cans of beer per week    Comment: pint of liquor a week  . Drug use: Yes    Types: Cocaine    Comment: 07/2015 last use   . Sexual activity: Yes    Birth control/protection: Post-menopausal  Other Topics Concern  . Not on file  Social History Narrative  . Not on file    FAMILY HISTORY:   Family Status  Relation Name Status  . Mother  Deceased  . Father  Deceased  . Sister  (Not Specified)  . Brother  (Not Specified)    ROS:  A complete 10 system review of systems was obtained and was unremarkable apart from what is mentioned above.  PHYSICAL EXAMINATION:    VITALS:  There were no vitals filed for this visit.  GEN:  Normal appears female in no acute distress.  Appears stated age. HEENT:  Normocephalic, atraumatic. The mucous membranes are moist. The superficial temporal arteries are without ropiness or tenderness. Cardiovascular: Regular rate and rhythm. Lungs: Clear to auscultation bilaterally. Neck/Heme: There are no carotid bruits noted bilaterally.  NEUROLOGICAL: Orientation:  The patient is alert and oriented x 3.  Fund of knowledge is appropriate.  Recent and remote memory intact.  Attention span and concentration normal.  Repeats and names without difficulty. Cranial nerves: There is good facial symmetry. The pupils are equal round and reactive to light bilaterally. Fundoscopic exam reveals clear disc margins bilaterally. Extraocular muscles are intact and visual fields are full to confrontational testing. Speech is fluent and clear. Soft palate rises symmetrically and there is no tongue deviation. Hearing is intact to conversational tone. Tone: Tone is good  throughout. Sensation: Sensation is intact to light touch and pinprick throughout (facial, trunk, extremities). Vibration is intact at the bilateral big toe. There is no extinction with double simultaneous stimulation. There is no sensory dermatomal level identified. Coordination:  The patient has no difficulty with RAM's or FNF bilaterally. Motor: Strength is 5/5 in the bilateral upper and lower extremities.  Shoulder shrug is equal and symmetric. There is no pronator drift.  There are no fasciculations noted. DTR's: Deep tendon reflexes are 2/4 at the bilateral biceps, triceps, brachioradialis, patella and achilles.  Plantar responses are downgoing bilaterally. Gait and Station: The patient is able to ambulate without difficulty. The patient is able to heel toe walk without any difficulty. The  patient is able to ambulate in a tandem fashion. The patient is able to stand in the Romberg position.   IMPRESSION/PLAN  1.  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   -Patient also has very uncontrolled diabetes.  Hemoglobin A1c of 14.6.  Noncompliance is a big issue.  Was discharged from [facility] neurology for no-shows.   -Carotid ultrasound in April, 2018 was unremarkable.    -Echocardiogram done in November, 2018 demonstrated normal left ventricular ejection fraction of 55-60% with grade 1 diastolic dysfunction.   2.  History of hyperlipidemia    -On Lipitor.  LDL goal reached.  LDL 39.                3.  History of seizure in April, 2018 after being admitted for hyperosmolar nonketotic state.    -On Keppra***  4.  Alcohol and drug abuse.   -Talked about the importance of complete cessation.  Information on [place] Stop Program  5.  Parotid nodule, consistent with neoplasm    -discussed importance of f/u with ENT.  Will make appt     ***This did not include the 40 min of record review which was detailed above, which was non face to  face time.    Cc:  [name] Ebbs, MD

## 2017-07-16 ENCOUNTER — Ambulatory Visit: Payer: Self-pay | Admitting: Neurology

## 2017-07-17 ENCOUNTER — Encounter (HOSPITAL_BASED_OUTPATIENT_CLINIC_OR_DEPARTMENT_OTHER): Payer: Medicaid Other

## 2017-08-02 ENCOUNTER — Emergency Department (HOSPITAL_COMMUNITY): Payer: Medicaid Other

## 2017-08-02 ENCOUNTER — Inpatient Hospital Stay (HOSPITAL_COMMUNITY)
Admission: EM | Admit: 2017-08-02 | Discharge: 2017-09-14 | DRG: 004 | Disposition: A | Discharge status: Skilled Nursing Facility | Payer: Medicaid Other | Attending: Internal Medicine | Admitting: Internal Medicine

## 2017-08-02 ENCOUNTER — Inpatient Hospital Stay (HOSPITAL_COMMUNITY): Payer: Medicaid Other

## 2017-08-02 ENCOUNTER — Other Ambulatory Visit: Payer: Self-pay

## 2017-08-02 ENCOUNTER — Encounter (HOSPITAL_COMMUNITY): Payer: Self-pay | Admitting: Emergency Medicine

## 2017-08-02 DIAGNOSIS — R739 Hyperglycemia, unspecified: Secondary | ICD-10-CM | POA: Diagnosis not present

## 2017-08-02 DIAGNOSIS — J9811 Atelectasis: Secondary | ICD-10-CM | POA: Diagnosis not present

## 2017-08-02 DIAGNOSIS — L8990 Pressure ulcer of unspecified site, unspecified stage: Secondary | ICD-10-CM | POA: Diagnosis not present

## 2017-08-02 DIAGNOSIS — E875 Hyperkalemia: Secondary | ICD-10-CM | POA: Diagnosis present

## 2017-08-02 DIAGNOSIS — Z930 Tracheostomy status: Secondary | ICD-10-CM

## 2017-08-02 DIAGNOSIS — J151 Pneumonia due to Pseudomonas: Secondary | ICD-10-CM | POA: Diagnosis present

## 2017-08-02 DIAGNOSIS — E118 Type 2 diabetes mellitus with unspecified complications: Secondary | ICD-10-CM

## 2017-08-02 DIAGNOSIS — M7989 Other specified soft tissue disorders: Secondary | ICD-10-CM | POA: Diagnosis not present

## 2017-08-02 DIAGNOSIS — E1165 Type 2 diabetes mellitus with hyperglycemia: Secondary | ICD-10-CM | POA: Diagnosis not present

## 2017-08-02 DIAGNOSIS — I5032 Chronic diastolic (congestive) heart failure: Secondary | ICD-10-CM | POA: Diagnosis present

## 2017-08-02 DIAGNOSIS — F1410 Cocaine abuse, uncomplicated: Secondary | ICD-10-CM | POA: Diagnosis not present

## 2017-08-02 DIAGNOSIS — F17210 Nicotine dependence, cigarettes, uncomplicated: Secondary | ICD-10-CM | POA: Diagnosis present

## 2017-08-02 DIAGNOSIS — R400 Somnolence: Secondary | ICD-10-CM | POA: Diagnosis not present

## 2017-08-02 DIAGNOSIS — I618 Other nontraumatic intracerebral hemorrhage: Secondary | ICD-10-CM | POA: Diagnosis not present

## 2017-08-02 DIAGNOSIS — Z8673 Personal history of transient ischemic attack (TIA), and cerebral infarction without residual deficits: Secondary | ICD-10-CM

## 2017-08-02 DIAGNOSIS — E1310 Other specified diabetes mellitus with ketoacidosis without coma: Secondary | ICD-10-CM

## 2017-08-02 DIAGNOSIS — E872 Acidosis, unspecified: Secondary | ICD-10-CM | POA: Diagnosis present

## 2017-08-02 DIAGNOSIS — Y95 Nosocomial condition: Secondary | ICD-10-CM | POA: Diagnosis present

## 2017-08-02 DIAGNOSIS — E870 Hyperosmolality and hypernatremia: Secondary | ICD-10-CM | POA: Diagnosis not present

## 2017-08-02 DIAGNOSIS — Z79899 Other long term (current) drug therapy: Secondary | ICD-10-CM

## 2017-08-02 DIAGNOSIS — E876 Hypokalemia: Secondary | ICD-10-CM | POA: Diagnosis not present

## 2017-08-02 DIAGNOSIS — J9601 Acute respiratory failure with hypoxia: Secondary | ICD-10-CM | POA: Diagnosis not present

## 2017-08-02 DIAGNOSIS — R1310 Dysphagia, unspecified: Secondary | ICD-10-CM | POA: Diagnosis present

## 2017-08-02 DIAGNOSIS — R9401 Abnormal electroencephalogram [EEG]: Secondary | ICD-10-CM | POA: Diagnosis not present

## 2017-08-02 DIAGNOSIS — E11622 Type 2 diabetes mellitus with other skin ulcer: Secondary | ICD-10-CM | POA: Diagnosis present

## 2017-08-02 DIAGNOSIS — L97319 Non-pressure chronic ulcer of right ankle with unspecified severity: Secondary | ICD-10-CM | POA: Diagnosis present

## 2017-08-02 DIAGNOSIS — Z9289 Personal history of other medical treatment: Secondary | ICD-10-CM

## 2017-08-02 DIAGNOSIS — N179 Acute kidney failure, unspecified: Secondary | ICD-10-CM | POA: Diagnosis present

## 2017-08-02 DIAGNOSIS — Z9114 Patient's other noncompliance with medication regimen: Secondary | ICD-10-CM

## 2017-08-02 DIAGNOSIS — Z794 Long term (current) use of insulin: Secondary | ICD-10-CM | POA: Diagnosis not present

## 2017-08-02 DIAGNOSIS — I4581 Long QT syndrome: Secondary | ICD-10-CM | POA: Diagnosis present

## 2017-08-02 DIAGNOSIS — E878 Other disorders of electrolyte and fluid balance, not elsewhere classified: Secondary | ICD-10-CM | POA: Diagnosis not present

## 2017-08-02 DIAGNOSIS — I82401 Acute embolism and thrombosis of unspecified deep veins of right lower extremity: Secondary | ICD-10-CM | POA: Diagnosis not present

## 2017-08-02 DIAGNOSIS — G40901 Epilepsy, unspecified, not intractable, with status epilepticus: Secondary | ICD-10-CM | POA: Diagnosis present

## 2017-08-02 DIAGNOSIS — R451 Restlessness and agitation: Secondary | ICD-10-CM | POA: Diagnosis not present

## 2017-08-02 DIAGNOSIS — E1110 Type 2 diabetes mellitus with ketoacidosis without coma: Secondary | ICD-10-CM | POA: Diagnosis present

## 2017-08-02 DIAGNOSIS — Z452 Encounter for adjustment and management of vascular access device: Secondary | ICD-10-CM

## 2017-08-02 DIAGNOSIS — Z01818 Encounter for other preprocedural examination: Secondary | ICD-10-CM

## 2017-08-02 DIAGNOSIS — IMO0002 Reserved for concepts with insufficient information to code with codable children: Secondary | ICD-10-CM | POA: Diagnosis present

## 2017-08-02 DIAGNOSIS — I10 Essential (primary) hypertension: Secondary | ICD-10-CM | POA: Diagnosis not present

## 2017-08-02 DIAGNOSIS — R4182 Altered mental status, unspecified: Secondary | ICD-10-CM | POA: Diagnosis present

## 2017-08-02 DIAGNOSIS — G4700 Insomnia, unspecified: Secondary | ICD-10-CM | POA: Diagnosis present

## 2017-08-02 DIAGNOSIS — I639 Cerebral infarction, unspecified: Secondary | ICD-10-CM | POA: Diagnosis present

## 2017-08-02 DIAGNOSIS — I6389 Other cerebral infarction: Secondary | ICD-10-CM | POA: Diagnosis present

## 2017-08-02 DIAGNOSIS — I82621 Acute embolism and thrombosis of deep veins of right upper extremity: Secondary | ICD-10-CM | POA: Diagnosis not present

## 2017-08-02 DIAGNOSIS — J9690 Respiratory failure, unspecified, unspecified whether with hypoxia or hypercapnia: Secondary | ICD-10-CM

## 2017-08-02 DIAGNOSIS — G40911 Epilepsy, unspecified, intractable, with status epilepticus: Secondary | ICD-10-CM | POA: Diagnosis present

## 2017-08-02 DIAGNOSIS — J9621 Acute and chronic respiratory failure with hypoxia: Secondary | ICD-10-CM | POA: Diagnosis present

## 2017-08-02 DIAGNOSIS — F1010 Alcohol abuse, uncomplicated: Secondary | ICD-10-CM | POA: Diagnosis not present

## 2017-08-02 DIAGNOSIS — E11649 Type 2 diabetes mellitus with hypoglycemia without coma: Secondary | ICD-10-CM | POA: Diagnosis not present

## 2017-08-02 DIAGNOSIS — I429 Cardiomyopathy, unspecified: Secondary | ICD-10-CM | POA: Diagnosis not present

## 2017-08-02 DIAGNOSIS — I110 Hypertensive heart disease with heart failure: Secondary | ICD-10-CM | POA: Diagnosis present

## 2017-08-02 DIAGNOSIS — D649 Anemia, unspecified: Secondary | ICD-10-CM | POA: Diagnosis not present

## 2017-08-02 DIAGNOSIS — Z978 Presence of other specified devices: Secondary | ICD-10-CM

## 2017-08-02 DIAGNOSIS — Z9119 Patient's noncompliance with other medical treatment and regimen: Secondary | ICD-10-CM | POA: Diagnosis not present

## 2017-08-02 DIAGNOSIS — Z4659 Encounter for fitting and adjustment of other gastrointestinal appliance and device: Secondary | ICD-10-CM

## 2017-08-02 DIAGNOSIS — Z6834 Body mass index (BMI) 34.0-34.9, adult: Secondary | ICD-10-CM

## 2017-08-02 DIAGNOSIS — I82A11 Acute embolism and thrombosis of right axillary vein: Secondary | ICD-10-CM | POA: Diagnosis not present

## 2017-08-02 DIAGNOSIS — G9340 Encephalopathy, unspecified: Secondary | ICD-10-CM | POA: Diagnosis not present

## 2017-08-02 DIAGNOSIS — R2233 Localized swelling, mass and lump, upper limb, bilateral: Secondary | ICD-10-CM | POA: Diagnosis not present

## 2017-08-02 DIAGNOSIS — F1910 Other psychoactive substance abuse, uncomplicated: Secondary | ICD-10-CM | POA: Diagnosis not present

## 2017-08-02 DIAGNOSIS — R0902 Hypoxemia: Secondary | ICD-10-CM | POA: Diagnosis not present

## 2017-08-02 DIAGNOSIS — E669 Obesity, unspecified: Secondary | ICD-10-CM | POA: Diagnosis present

## 2017-08-02 DIAGNOSIS — R569 Unspecified convulsions: Secondary | ICD-10-CM | POA: Diagnosis not present

## 2017-08-02 DIAGNOSIS — J189 Pneumonia, unspecified organism: Secondary | ICD-10-CM

## 2017-08-02 HISTORY — DX: Acute embolism and thrombosis of deep veins of right upper extremity: I82.621

## 2017-08-02 HISTORY — DX: Chronic diastolic (congestive) heart failure: I50.32

## 2017-08-02 LAB — BLOOD GAS, ARTERIAL
ACID-BASE DEFICIT: 3.6 mmol/L — AB (ref 0.0–2.0)
BICARBONATE: 25.9 mmol/L (ref 20.0–28.0)
Drawn by: 406621
FIO2: 21
O2 Saturation: 96.8 %
PATIENT TEMPERATURE: 98.6
PO2 ART: 130 mmHg — AB (ref 83.0–108.0)
pCO2 arterial: 96.7 mmHg (ref 32.0–48.0)
pH, Arterial: 7.056 — CL (ref 7.350–7.450)

## 2017-08-02 LAB — I-STAT ARTERIAL BLOOD GAS, ED
Acid-base deficit: 4 mmol/L — ABNORMAL HIGH (ref 0.0–2.0)
BICARBONATE: 23.5 mmol/L (ref 20.0–28.0)
O2 SAT: 94 %
PCO2 ART: 50.9 mmHg — AB (ref 32.0–48.0)
PO2 ART: 81 mmHg — AB (ref 83.0–108.0)
Patient temperature: 97.8
TCO2: 25 mmol/L (ref 22–32)
pH, Arterial: 7.269 — ABNORMAL LOW (ref 7.350–7.450)

## 2017-08-02 LAB — PHOSPHORUS: Phosphorus: 7 mg/dL — ABNORMAL HIGH (ref 2.5–4.6)

## 2017-08-02 LAB — URINALYSIS, ROUTINE W REFLEX MICROSCOPIC
BILIRUBIN URINE: NEGATIVE
Glucose, UA: >=500 mg/dL — AB
KETONES UR: NEGATIVE mg/dL
LEUKOCYTES UA: NEGATIVE
NITRITE: NEGATIVE
PH: 7 (ref 5.0–8.0)
Protein, ur: 100 mg/dL — AB
SPECIFIC GRAVITY, URINE: 1.017 (ref 1.005–1.030)

## 2017-08-02 LAB — COMPREHENSIVE METABOLIC PANEL
ALBUMIN: 2.8 g/dL — AB (ref 3.5–5.0)
ALT: 20 U/L (ref 14–54)
ANION GAP: 16 — AB (ref 5–15)
AST: 57 U/L — AB (ref 15–41)
Alkaline Phosphatase: 55 U/L (ref 38–126)
BUN: 13 mg/dL (ref 6–20)
CO2: 18 mmol/L — AB (ref 22–32)
Calcium: 8.9 mg/dL (ref 8.9–10.3)
Chloride: 100 mmol/L — ABNORMAL LOW (ref 101–111)
Creatinine, Ser: 1.51 mg/dL — ABNORMAL HIGH (ref 0.44–1.00)
GFR calc Af Amer: 42 mL/min — ABNORMAL LOW (ref >60)
GFR calc non Af Amer: 36 mL/min — ABNORMAL LOW (ref >60)
Glucose, Bld: 616 mg/dL (ref 65–99)
POTASSIUM: 5.4 mmol/L — AB (ref 3.5–5.1)
SODIUM: 134 mmol/L — AB (ref 135–145)
Total Bilirubin: 2.2 mg/dL — ABNORMAL HIGH (ref 0.3–1.2)
Total Protein: 7.4 g/dL (ref 6.5–8.1)

## 2017-08-02 LAB — I-STAT CG4 LACTIC ACID, ED
LACTIC ACID, VENOUS: 2.23 mmol/L — AB (ref 0.5–1.9)
Lactic Acid, Venous: 2.96 mmol/L (ref 0.5–1.9)

## 2017-08-02 LAB — CREATININE, SERUM
Creatinine, Ser: 1.6 mg/dL — ABNORMAL HIGH (ref 0.44–1.00)
GFR, EST AFRICAN AMERICAN: 39 mL/min — AB (ref >60)
GFR, EST NON AFRICAN AMERICAN: 34 mL/min — AB (ref >60)

## 2017-08-02 LAB — RAPID URINE DRUG SCREEN, HOSP PERFORMED
AMPHETAMINES: NOT DETECTED
BENZODIAZEPINES: NOT DETECTED
Barbiturates: NOT DETECTED
COCAINE: POSITIVE — AB
Opiates: NOT DETECTED
Tetrahydrocannabinol: NOT DETECTED

## 2017-08-02 LAB — CBC
HCT: 47.3 % — ABNORMAL HIGH (ref 36.0–46.0)
HEMATOCRIT: 47.7 % — AB (ref 36.0–46.0)
HEMOGLOBIN: 16 g/dL — AB (ref 12.0–15.0)
Hemoglobin: 15 g/dL (ref 12.0–15.0)
MCH: 32 pg (ref 26.0–34.0)
MCH: 31.3 pg (ref 26.0–34.0)
MCHC: 33.5 g/dL (ref 30.0–36.0)
MCHC: 31.7 g/dL (ref 30.0–36.0)
MCV: 95.4 fL (ref 78.0–100.0)
MCV: 98.5 fL (ref 78.0–100.0)
PLATELETS: 270 10*3/uL (ref 150–400)
Platelets: 276 10*3/uL (ref 150–400)
RBC: 5 MIL/uL (ref 3.87–5.11)
RBC: 4.8 MIL/uL (ref 3.87–5.11)
RDW: 14.1 % (ref 11.5–15.5)
RDW: 14.5 % (ref 11.5–15.5)
WBC: 9.1 10*3/uL (ref 4.0–10.5)
WBC: 15.4 10*3/uL — ABNORMAL HIGH (ref 4.0–10.5)

## 2017-08-02 LAB — CBG MONITORING, ED
GLUCOSE-CAPILLARY: 567 mg/dL — AB (ref 65–99)
GLUCOSE-CAPILLARY: 394 mg/dL — AB (ref 65–99)
GLUCOSE-CAPILLARY: 257 mg/dL — AB (ref 65–99)
Glucose-Capillary: >600 mg/dL (ref 65–99)
Glucose-Capillary: 342 mg/dL — ABNORMAL HIGH (ref 65–99)
Glucose-Capillary: 269 mg/dL — ABNORMAL HIGH (ref 65–99)

## 2017-08-02 LAB — AMMONIA: Ammonia: 56 umol/L — ABNORMAL HIGH (ref 9–35)

## 2017-08-02 LAB — VITAMIN B12: VITAMIN B 12: 623 pg/mL (ref 180–914)

## 2017-08-02 LAB — TRIGLYCERIDES: Triglycerides: 226 mg/dL — ABNORMAL HIGH (ref <150)

## 2017-08-02 LAB — I-STAT BETA HCG BLOOD, ED (MC, WL, AP ONLY): I-stat hCG, quantitative: <5 m[IU]/mL (ref <5)

## 2017-08-02 LAB — POTASSIUM: Potassium: 3.1 mmol/L — ABNORMAL LOW (ref 3.5–5.1)

## 2017-08-02 LAB — CK: CK TOTAL: 74 U/L (ref 38–234)

## 2017-08-02 LAB — GLUCOSE, CAPILLARY: GLUCOSE-CAPILLARY: 255 mg/dL — AB (ref 65–99)

## 2017-08-02 LAB — TSH: TSH: 0.832 u[IU]/mL (ref 0.350–4.500)

## 2017-08-02 LAB — MAGNESIUM: Magnesium: 1.8 mg/dL (ref 1.7–2.4)

## 2017-08-02 MED ORDER — SODIUM CHLORIDE 0.9 % IV SOLN
200.0000 mg | Freq: Once | INTRAVENOUS | Status: AC
  Administered 2017-08-02: 200 mg via INTRAVENOUS
  Filled 2017-08-02: qty 20

## 2017-08-02 MED ORDER — SODIUM CHLORIDE 0.9 % IV SOLN
500.0000 mg | Freq: Two times a day (BID) | INTRAVENOUS | Status: DC
  Administered 2017-08-03 – 2017-08-09 (×13): 500 mg via INTRAVENOUS
  Filled 2017-08-02 (×15): qty 5

## 2017-08-02 MED ORDER — SODIUM CHLORIDE 0.9 % IV SOLN
20.0000 mg/h | INTRAVENOUS | Status: DC
  Administered 2017-08-03: 20 mg/h via INTRAVENOUS
  Administered 2017-08-03 (×2): 10 mg/h via INTRAVENOUS
  Administered 2017-08-04: 20 mg/h via INTRAVENOUS
  Administered 2017-08-04: 10 mg/h via INTRAVENOUS
  Administered 2017-08-04 (×2): 20 mg/h via INTRAVENOUS
  Filled 2017-08-02 (×7): qty 20

## 2017-08-02 MED ORDER — LORAZEPAM 2 MG/ML IJ SOLN
INTRAMUSCULAR | Status: AC
  Filled 2017-08-02: qty 2

## 2017-08-02 MED ORDER — SODIUM CHLORIDE 0.9 % IV SOLN
INTRAVENOUS | Status: AC
  Administered 2017-08-02: 18:00:00 via INTRAVENOUS

## 2017-08-02 MED ORDER — NICARDIPINE HCL IN NACL 20-0.86 MG/200ML-% IV SOLN
3.0000 mg/h | INTRAVENOUS | Status: DC
  Administered 2017-08-02: 5 mg/h via INTRAVENOUS
  Filled 2017-08-02: qty 200

## 2017-08-02 MED ORDER — SODIUM CHLORIDE 0.9 % IV BOLUS (SEPSIS)
1000.0000 mL | Freq: Once | INTRAVENOUS | Status: AC
  Administered 2017-08-02: 1000 mL via INTRAVENOUS

## 2017-08-02 MED ORDER — HYDRALAZINE HCL 20 MG/ML IJ SOLN
10.0000 mg | Freq: Once | INTRAMUSCULAR | Status: AC
  Administered 2017-08-02: 10 mg via INTRAVENOUS
  Filled 2017-08-02: qty 1

## 2017-08-02 MED ORDER — PROPOFOL 1000 MG/100ML IV EMUL
140.0000 ug/kg/min | INTRAVENOUS | Status: DC
  Administered 2017-08-02 (×2): 50 ug/kg/min via INTRAVENOUS
  Administered 2017-08-03: 120 ug/kg/min via INTRAVENOUS
  Administered 2017-08-03: 130 ug/kg/min via INTRAVENOUS
  Administered 2017-08-03: 120 ug/kg/min via INTRAVENOUS
  Administered 2017-08-03: 130 ug/kg/min via INTRAVENOUS
  Administered 2017-08-03 (×2): 120 ug/kg/min via INTRAVENOUS
  Administered 2017-08-03: 110 ug/kg/min via INTRAVENOUS
  Administered 2017-08-03: 120 ug/kg/min via INTRAVENOUS
  Administered 2017-08-03: 80 ug/kg/min via INTRAVENOUS
  Administered 2017-08-03: 130 ug/kg/min via INTRAVENOUS
  Administered 2017-08-03: 110 ug/kg/min via INTRAVENOUS
  Administered 2017-08-03: 130 ug/kg/min via INTRAVENOUS
  Administered 2017-08-03 – 2017-08-04 (×7): 140 ug/kg/min via INTRAVENOUS
  Administered 2017-08-04: 120 ug/kg/min via INTRAVENOUS
  Administered 2017-08-04 (×8): 140 ug/kg/min via INTRAVENOUS
  Filled 2017-08-02 (×2): qty 100
  Filled 2017-08-02: qty 200
  Filled 2017-08-02: qty 100
  Filled 2017-08-02 (×2): qty 200
  Filled 2017-08-02 (×3): qty 100
  Filled 2017-08-02 (×2): qty 200
  Filled 2017-08-02: qty 100
  Filled 2017-08-02: qty 200
  Filled 2017-08-02 (×3): qty 100
  Filled 2017-08-02: qty 200
  Filled 2017-08-02 (×5): qty 100
  Filled 2017-08-02: qty 200
  Filled 2017-08-02 (×2): qty 100

## 2017-08-02 MED ORDER — DEXTROSE-NACL 5-0.45 % IV SOLN
INTRAVENOUS | Status: DC
  Administered 2017-08-02: 23:00:00 via INTRAVENOUS

## 2017-08-02 MED ORDER — SODIUM CHLORIDE 0.9 % IV SOLN
INTRAVENOUS | Status: DC
  Administered 2017-08-02: 5.4 [IU]/h via INTRAVENOUS
  Filled 2017-08-02: qty 1

## 2017-08-02 MED ORDER — PANTOPRAZOLE SODIUM 40 MG IV SOLR
40.0000 mg | Freq: Every day | INTRAVENOUS | Status: DC
  Administered 2017-08-02 – 2017-08-08 (×7): 40 mg via INTRAVENOUS
  Filled 2017-08-02 (×7): qty 40

## 2017-08-02 MED ORDER — LORAZEPAM 2 MG/ML IJ SOLN
1.0000 mg | Freq: Once | INTRAMUSCULAR | Status: AC
  Administered 2017-08-02: 1 mg via INTRAVENOUS

## 2017-08-02 MED ORDER — SODIUM CHLORIDE 0.9 % IV SOLN: 500.0000 mg | Freq: Two times a day (BID) | INTRAVENOUS | Status: DC

## 2017-08-02 MED ORDER — LORAZEPAM 2 MG/ML IJ SOLN
2.0000 mg | Freq: Once | INTRAMUSCULAR | Status: AC
  Administered 2017-08-02: 2 mg via INTRAVENOUS

## 2017-08-02 MED ORDER — DEXTROSE-NACL 5-0.45 % IV SOLN: INTRAVENOUS | Status: DC

## 2017-08-02 MED ORDER — HEPARIN SODIUM (PORCINE) 5000 UNIT/ML IJ SOLN
5000.0000 [IU] | Freq: Three times a day (TID) | INTRAMUSCULAR | Status: DC
  Administered 2017-08-02 – 2017-08-16 (×40): 5000 [IU] via SUBCUTANEOUS
  Filled 2017-08-02 (×41): qty 1

## 2017-08-02 MED ORDER — SODIUM CHLORIDE 0.9 % IV SOLN: 250.0000 mL | INTRAVENOUS | Status: DC | PRN

## 2017-08-02 MED ORDER — ETOMIDATE 2 MG/ML IV SOLN
INTRAVENOUS | Status: AC | PRN
Start: 2017-08-02 — End: 2017-08-02
  Administered 2017-08-02: 20 mg via INTRAVENOUS

## 2017-08-02 MED ORDER — SODIUM CHLORIDE 0.9 % IV SOLN
1000.0000 mg | Freq: Once | INTRAVENOUS | Status: AC
  Administered 2017-08-02: 1000 mg via INTRAVENOUS
  Filled 2017-08-02: qty 10

## 2017-08-02 MED ORDER — ROCURONIUM BROMIDE 50 MG/5ML IV SOLN
INTRAVENOUS | Status: AC | PRN
  Administered 2017-08-02: 50 mg via INTRAVENOUS

## 2017-08-02 MED ORDER — LORAZEPAM 2 MG/ML IJ SOLN
1.0000 mg | INTRAMUSCULAR | Status: AC
  Administered 2017-08-02 (×2): 1 mg via INTRAVENOUS
  Filled 2017-08-02: qty 1

## 2017-08-02 MED ORDER — SODIUM CHLORIDE 0.9 % IV SOLN
100.0000 mg | Freq: Two times a day (BID) | INTRAVENOUS | Status: DC
  Administered 2017-08-03 (×2): 100 mg via INTRAVENOUS
  Filled 2017-08-02 (×4): qty 10

## 2017-08-02 MED ORDER — LORAZEPAM 2 MG/ML IJ SOLN
2.0000 mg | Freq: Once | INTRAMUSCULAR | Status: AC
  Administered 2017-08-02: 2 mg via INTRAVENOUS
  Filled 2017-08-02: qty 1

## 2017-08-02 MED ORDER — POTASSIUM CHLORIDE 10 MEQ/100ML IV SOLN
10.0000 meq | INTRAVENOUS | Status: AC
  Administered 2017-08-02 (×2): 10 meq via INTRAVENOUS
  Filled 2017-08-02 (×2): qty 100

## 2017-08-02 MED ORDER — SODIUM CHLORIDE 0.9 % IV SOLN: INTRAVENOUS | Status: DC

## 2017-08-02 MED ORDER — SODIUM CHLORIDE 0.9 % IV SOLN: 10.0000 mg/h | INTRAVENOUS | Status: DC

## 2017-08-02 MED ORDER — ACETAMINOPHEN 325 MG PO TABS: 325.0000 mg | ORAL_TABLET | ORAL | Status: DC | PRN

## 2017-08-02 MED ORDER — LORAZEPAM 2 MG/ML IJ SOLN
1.0000 mg | Freq: Once | INTRAMUSCULAR | Status: DC
Start: 2017-08-02 — End: 2017-08-02
  Filled 2017-08-02: qty 1

## 2017-08-02 MED ORDER — SODIUM CHLORIDE 0.9 % IV SOLN
INTRAVENOUS | Status: DC
  Administered 2017-08-02: 19:00:00 via INTRAVENOUS

## 2017-08-02 MED ORDER — SODIUM CHLORIDE 0.9 % IV SOLN
1500.0000 mg | Freq: Once | INTRAVENOUS | Status: AC
  Administered 2017-08-02: 1500 mg via INTRAVENOUS
  Filled 2017-08-02: qty 30

## 2017-08-02 MED ORDER — LORAZEPAM 2 MG/ML IJ SOLN
4.0000 mg | Freq: Once | INTRAMUSCULAR | Status: AC
Start: 2017-08-02 — End: 2017-08-02
  Administered 2017-08-02: 4 mg via INTRAVENOUS
  Filled 2017-08-02: qty 2

## 2017-08-02 MED ORDER — PHENYTOIN SODIUM 50 MG/ML IJ SOLN
100.0000 mg | Freq: Three times a day (TID) | INTRAMUSCULAR | Status: DC
  Administered 2017-08-03 – 2017-08-07 (×15): 100 mg via INTRAVENOUS
  Filled 2017-08-02 (×15): qty 2

## 2017-08-02 MED ORDER — THIAMINE HCL 100 MG/ML IJ SOLN
100.0000 mg | Freq: Every day | INTRAMUSCULAR | Status: DC
  Administered 2017-08-02 – 2017-08-14 (×13): 100 mg via INTRAVENOUS
  Filled 2017-08-02: qty 1
  Filled 2017-08-02: qty 2
  Filled 2017-08-02 (×11): qty 1

## 2017-08-02 NOTE — Progress Notes (Signed)
EEG complete - results pending 

## 2017-08-02 NOTE — ED Provider Notes (Addendum)
[facility] EMERGENCY DEPARTMENT Provider Note   CSN: [id number] Arrival date & time: 08/02/17  1256     History   Chief Complaint Chief Complaint  Patient presents with  . Altered Mental Status    HPI [name] is a 62 y.o. female.  Patient with hx iddm, presents with generalized weakness, and increased confusion in past couple of days ('since Tuesday').  Symptoms moderate-severe, persistent, worse today. Patient very limited historian, and is not verbally responding to questions - level 5 caveat. No reported trauma/fall. No report of fevers. +productive cough noted in ED.    The history is provided by the patient and the EMS personnel. The history is limited by the condition of the patient.  Altered Mental Status      Past Medical History:  Diagnosis Date  . Cocaine abuse, continuous ([place])   . Diabetes mellitus    Hb A1C = 12.6 on 05/15/11, managed on Novolog 70/30, 35 U qam, 25 U qpm  . Hypertension    poorly controlled  . Insomnia disorder   . Shortness of breath     Patient Active Problem List   Diagnosis Date Noted  . H/O open leg wound 06/04/2017  . Open leg wound 06/04/2017  . Acute metabolic encephalopathy 27/25/3664  . Intracranial bleed ([place]) 05/26/2017  . ICH (intracerebral hemorrhage) ([place]) 05/26/2017  . Type II diabetes mellitus with renal manifestations ([place]) 05/25/2017  . SIRS (systemic inflammatory response syndrome) ([place]) 05/25/2017  . Aspiration pneumonia due to gastric secretions ([place])   . Confusion 05/10/2017  . Benign essential HTN   . Chronic hepatitis C without hepatic coma ([place])   . Labile blood glucose   . Hypoglycemia associated with type 2 diabetes mellitus ([place])   . Poorly controlled type 2 diabetes mellitus with peripheral neuropathy ([place])   . Neurologic gait disorder   . Neuropathic pain   . Type 2 diabetes mellitus with peripheral neuropathy (HCC)   . History of intracranial hemorrhage   . Diarrhea   .  Urinary incontinence   . Hypokalemia   . Polysubstance abuse ([place])   . Right sided cerebral hemisphere cerebrovascular accident (CVA) ([place])   . Disorientation   . Meningitis 11/02/2016  . Type 2 diabetes mellitus with hyperosmolar nonketotic hyperglycemia ([place]) 10/31/2016  . Seizure ([place]) 10/31/2016  . ARF (acute renal failure) ([place]) 10/31/2016  . Severe sepsis ([place]) 10/31/2016  . DKA (diabetic ketoacidoses) ([place]) 10/31/2016  . Acute renal failure superimposed on stage 3 chronic kidney disease ([place])   . Right hemiparesis ([place]) 06/09/2016  . 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 Cytotoxic brain edema ([place]) 06/08/2016  . Encephalopathy acute 06/06/2016  . Chest pain, musculoskeletal 05/16/2011  . Vaginal candidiasis 05/16/2011  . Diabetes mellitus, type 2 ([place]) 05/16/2011  . CHEST PAIN 10/01/2009  . CELLULITIS AND ABSCESS OF LEG EXCEPT FOOT 08/31/2009  . INSOMNIA 07/13/2008  . MRSA 04/17/2008  . NECK MASS 04/17/2008  . COCAINE ABUSE 11/01/2007  . OBESITY NOS 07/14/2006  . DENTAL CARIES 07/14/2006  . DIABETES MELLITUS, TYPE II 04/26/2006  . Alcohol abuse 04/26/2006  . TOBACCO ABUSE 04/26/2006  . DEPRESSION 04/26/2006  . Essential hypertension 04/26/2006  . GERD 04/26/2006  . LOW BACK PAIN 04/26/2006    Past Surgical History:  Procedure Laterality Date  . ANKLE FRACTURE SURGERY  2007    OB History    No data available       Home Medications    Prior to Admission medications  Medication Sig Start Date End Date Taking? Authorizing Provider  acetaminophen (TYLENOL) 325 MG tablet Take 2 tablets (650 mg total) every 6 (six) hours as needed by mouth for mild pain, fever or headache. 05/15/17   Barton Dubois, MD  amLODipine (NORVASC) 10 MG tablet Take 1 tablet (10 mg total) daily by mouth. Patient not taking: Reported on 05/25/2017 05/15/17   Barton Dubois, MD  atorvastatin (LIPITOR) 40 MG tablet Take 1 tablet (40 mg total) daily at 6 PM by mouth. 05/15/17   Barton Dubois, MD  collagenase (SANTYL) ointment Apply topically daily. Apply Santyl to right leg wound Q day, then cover with moist gauze and dry gauze and kerlex 06/05/17   Aline August, MD  cyclobenzaprine (FLEXERIL) 10 MG tablet Take 1 tablet (10 mg total) at bedtime by mouth. 05/15/17   Barton Dubois, MD  folic acid (FOLVITE) 1 MG tablet Take 1 tablet (1 mg total) by mouth daily. 06/05/17   Aline August, MD  gabapentin (NEURONTIN) 300 MG capsule Take 1 capsule (300 mg total) 3 (three) times daily by mouth. 05/15/17   Barton Dubois, MD  insulin aspart protamine- aspart (NOVOLOG MIX 70/30) (70-30) 100 UNIT/ML injection Inject 0.2 mLs (20 Units total) into the skin 2 (two) times daily with a meal. 06/04/17   Aline August, MD  levETIRAcetam (KEPPRA) 500 MG tablet Take 1 tablet (500 mg total) 2 (two) times daily by mouth. 05/15/17   Barton Dubois, MD  Multiple Vitamin (MULTIVITAMIN WITH MINERALS) TABS tablet Take 1 tablet by mouth daily. 06/20/16   Angiulli, Lavon Paganini, PA-C  nystatin cream (MYCOSTATIN) Apply topically 2 (two) times daily. 06/04/17   Aline August, MD  oxybutynin (DITROPAN-XL) 5 MG 24 hr tablet Take 1 tablet (5 mg total) at bedtime by mouth. 05/15/17   Barton Dubois, MD  pantoprazole (PROTONIX) 40 MG tablet Take 1 tablet (40 mg total) by mouth daily. 06/04/17   Aline August, MD  PROAIR HFA 108 (90 Base) MCG/ACT inhaler Inhale 2 puffs into the lungs 4 (four) times daily as needed for wheezing. 10/18/16   [provider]  thiamine 100 MG tablet Take 1 tablet (100 mg total) by mouth daily. 06/05/17   Aline August, MD    Family History Family History  Problem Relation Age of Onset  . Stroke Father   . Diabetes Sister   . Diabetes Brother     Social History Social History   Tobacco Use  . Smoking status: Current Every Day Smoker    Packs/day: 1.50    Years: 25.00    Pack years: 37.50    Types: Cigarettes  . Smokeless tobacco: Never Used  Substance Use Topics  .  Alcohol use: Yes    Alcohol/week: 4.8 oz    Types: 2 Glasses of wine, 6 Cans of beer per week    Comment: pint of liquor a week  . Drug use: Yes    Types: Cocaine    Comment: 07/2015 last use      Allergies   Patient has no known allergies.   Review of Systems Review of Systems  Unable to perform ROS: Patient nonverbal  Constitutional: Negative for fever.  level 5 caveat, not verbally responsive to questions.    Physical Exam Updated Vital Signs BP (!) 225/124   Pulse (!) 117   Resp 16   SpO2 97%   Physical Exam  Constitutional: She appears well-developed.  Tachycardic, hypertensive.   HENT:  Head: Atraumatic.  Mouth/Throat: Oropharynx is clear and  moist.  Eyes: Conjunctivae are normal. Pupils are equal, round, and reactive to light. No scleral icterus.  Neck: Neck supple. No tracheal deviation present. No thyromegaly present.  No stiffness or rigidity  Cardiovascular: Regular rhythm, normal heart sounds and intact distal pulses.  Tachycardic.   Pulmonary/Chest: Effort normal and breath sounds normal. No respiratory distress.  Abdominal: Soft. Normal appearance and bowel sounds are normal. She exhibits no distension. There is no tenderness.  Genitourinary:  Genitourinary Comments: No cva tenderness  Musculoskeletal: She exhibits no edema or tenderness.  Neurological: She is alert.  Patient sitting upright, awake and alert appearing. Is coughing, small amt sputum. Is clearing throat post coughing. No shaking/tremulousness. Poorly very compliant w exam. Does not dry to speak or respond verbally.   Skin: Skin is warm and dry. No rash noted.  Psychiatric:  Awake and alert, does not respond verbally to questions.   Nursing note and vitals reviewed.    ED Treatments / Results  Labs (all labs ordered are listed, but only abnormal results are displayed) Results for orders placed or performed during the hospital encounter of 08/02/17  Comprehensive metabolic panel    Result Value Ref Range   Sodium 134 (L) 135 - 145 mmol/L   Potassium 5.4 (H) 3.5 - 5.1 mmol/L   Chloride 100 (L) 101 - 111 mmol/L   CO2 18 (L) 22 - 32 mmol/L   Glucose, Bld 616 (HH) 65 - 99 mg/dL   BUN 13 6 - 20 mg/dL   Creatinine, Ser 1.51 (H) 0.44 - 1.00 mg/dL   Calcium 8.9 8.9 - 10.3 mg/dL   Total Protein 7.4 6.5 - 8.1 g/dL   Albumin 2.8 (L) 3.5 - 5.0 g/dL   AST 57 (H) 15 - 41 U/L   ALT 20 14 - 54 U/L   Alkaline Phosphatase 55 38 - 126 U/L   Total Bilirubin 2.2 (H) 0.3 - 1.2 mg/dL   GFR calc non Af Amer 36 (L) >60 mL/min   GFR calc Af Amer 42 (L) >60 mL/min   Anion gap 16 (H) 5 - 15  CBC  Result Value Ref Range   WBC 9.1 4.0 - 10.5 K/uL   RBC 5.00 3.87 - 5.11 MIL/uL   Hemoglobin 16.0 (H) 12.0 - 15.0 g/dL   HCT 47.7 (H) 36.0 - 46.0 %   MCV 95.4 78.0 - 100.0 fL   MCH 32.0 26.0 - 34.0 pg   MCHC 33.5 30.0 - 36.0 g/dL   RDW 14.1 11.5 - 15.5 %   Platelets 276 150 - 400 K/uL  Urinalysis, Routine w reflex microscopic  Result Value Ref Range   Color, Urine STRAW (A) YELLOW   APPearance HAZY (A) CLEAR   Specific Gravity, Urine 1.017 1.005 - 1.030   pH 7.0 5.0 - 8.0   Glucose, UA >=500 (A) NEGATIVE mg/dL   Hgb urine dipstick SMALL (A) NEGATIVE   Bilirubin Urine NEGATIVE NEGATIVE   Ketones, ur NEGATIVE NEGATIVE mg/dL   Protein, ur 100 (A) NEGATIVE mg/dL   Nitrite NEGATIVE NEGATIVE   Leukocytes, UA NEGATIVE NEGATIVE   RBC / HPF 0-5 0 - 5 RBC/hpf   WBC, UA 0-5 0 - 5 WBC/hpf   Bacteria, UA RARE (A) NONE SEEN   Squamous Epithelial / LPF 0-5 (A) NONE SEEN  Rapid urine drug screen (hospital performed)  Result Value Ref Range   Opiates NONE DETECTED NONE DETECTED   Cocaine POSITIVE (A) NONE DETECTED   Benzodiazepines NONE DETECTED NONE DETECTED  Amphetamines NONE DETECTED NONE DETECTED   Tetrahydrocannabinol NONE DETECTED NONE DETECTED   Barbiturates NONE DETECTED NONE DETECTED  CBG monitoring, ED  Result Value Ref Range   Glucose-Capillary >600 (HH) 65 - 99 mg/dL  I-Stat  beta hCG blood, ED  Result Value Ref Range   I-stat hCG, quantitative <5.0 <5 mIU/mL   Comment 3          I-Stat CG4 Lactic Acid, ED  Result Value Ref Range   Lactic Acid, Venous 2.23 (HH) 0.5 - 1.9 mmol/L   Comment NOTIFIED PHYSICIAN    Ct Head Wo Contrast  Result Date: 08/02/2017 CLINICAL DATA:  Altered level of consciousness EXAM: CT HEAD WITHOUT CONTRAST TECHNIQUE: Contiguous axial images were obtained from the base of the skull through the vertex without intravenous contrast. COMPARISON:  CT head 05/26/2017 FINDINGS: Brain: Generalized atrophy without hydrocephalus. Mild chronic microvascular ischemic change in the white matter. Small chronic infarct in the right medial basal ganglia. Negative for acute hemorrhage. Negative for acute infarct or mass. Left thalamic hematoma on the prior study no longer visualized. Vascular: Negative for hyperdense vessel Skull: Negative Sinuses/Orbits: Negative Other: None IMPRESSION: Atrophy and chronic ischemia.  No acute abnormality. Electronically Signed   By: Franchot Gallo M.D.   On: 08/02/2017 15:40    EKG  EKG Interpretation  Date/Time:  Thursday August 02 2017 13:25:53 EST Ventricular Rate:  101 PR Interval:    QRS Duration: 86 QT Interval:  395 QTC Calculation: 512 R Axis:   74 Text Interpretation:  Sinus tachycardia Biatrial enlargement Prolonged QT interval No significant change since last tracing Confirmed by Lajean Saver [phone]) on 08/02/2017 2:03:35 PM       Radiology Ct Head Wo Contrast  Result Date: 08/02/2017 CLINICAL DATA:  Altered level of consciousness EXAM: CT HEAD WITHOUT CONTRAST TECHNIQUE: Contiguous axial images were obtained from the base of the skull through the vertex without intravenous contrast. COMPARISON:  CT head 05/26/2017 FINDINGS: Brain: Generalized atrophy without hydrocephalus. Mild chronic microvascular ischemic change in the white matter. Small chronic infarct in the right medial basal ganglia. Negative  for acute hemorrhage. Negative for acute infarct or mass. Left thalamic hematoma on the prior study no longer visualized. Vascular: Negative for hyperdense vessel Skull: Negative Sinuses/Orbits: Negative Other: None IMPRESSION: Atrophy and chronic ischemia.  No acute abnormality. Electronically Signed   By: Franchot Gallo M.D.   On: 08/02/2017 15:40    Procedures Procedures (including critical care time)  Medications Ordered in ED Medications  sodium chloride 0.9 % bolus 1,000 mL (not administered)     Initial Impression / Assessment and Plan / ED Course  I have reviewed the triage vital signs and the nursing notes.  Pertinent labs & imaging results that were available during my care of the patient were reviewed by me and considered in my medical decision making (see chart for details).  Iv ns bolus. Continuous pulse ox and monitor.  Labs.  Continuous pulse ox and monitor.   Reviewed nursing notes and prior charts for additional history.   bp remains very high - history uncontrolled htn.  Hydralazine iv.   Additional ivf. For glucose > 600, iv insulin via glucose stabilizer.   Multiple rechecks of pt - no change from initial.   After return from CT, pt with twitching/tremulousness face/mouth, and rue, and hr increased from prior,  c/w seizure. Ativan 1 mg iv. Iv keppra ordered. Neurology consulted. Recheck no shaking/seizure.  Discussed w neurology - they will evaluate in  ED.  Recheck, return of seizure activity. Ativan 2 mg iv. Neurology is in ED now.   As in status szs, critical care paged for admission - discussed w pccm doctor on call - he will see in ED/admit.   Awaiting iv keppra.  CRITICAL CARE  RE: severe hyperglycemia, dka, severe/uncontrolled hypertension, status epilepticus, cocaine abuse.  Performed by: Mirna Mires Total critical care time: 110 minutes Critical care time was exclusive of separately billable procedures and treating other patients. Critical  care was necessary to treat or prevent imminent or life-threatening deterioration. Critical care was time spent personally by me on the following activities: development of treatment plan with patient and/or surrogate as well as nursing, discussions with consultants, evaluation of patient's response to treatment, examination of patient, obtaining history from patient or surrogate, ordering and performing treatments and interventions, ordering and review of laboratory studies, ordering and review of radiographic studies, pulse oximetry and re-evaluation of patient's condition.     Final Clinical Impressions(s) / ED Diagnoses   Final diagnoses:  None    ED Discharge Orders    None             Lajean Saver, MD 08/02/17 [phone]

## 2017-08-02 NOTE — ED Triage Notes (Addendum)
Pt arrives from home via [facility] reporting AMS since Tuesday, per EMS pt ambulatory and verbal at baseline.  EMS reports pt soaked in urine on their arrival, reports family said pt confused on Tuesday, nonverbal since yesterday, hx CVA.  R field cut noted on arrival, no facial droop, no obvious weakness. Pt not following commands. Wound noted to RLE. Cough noted.

## 2017-08-02 NOTE — ED Notes (Signed)
Pt was given ativan recently, RASS score -5, will start propofol when appropriate

## 2017-08-02 NOTE — ED Notes (Signed)
Dr Leonel Ramsay to change propofol to 56mcg and hold versed until central line placed

## 2017-08-02 NOTE — ED Notes (Signed)
Pt CBG 269 RN Nikki notified.

## 2017-08-02 NOTE — Procedures (Signed)
Endotracheal intubation The patient was intubated for CO2 retention and extreme acidosis.  Patient was unresponsive and the procedure was performed as medically indicated. Patient was preoxygenated with an Ambu bag and sedated with 2 mg of Ativan followed by 20 mg of etomidate and 50 mg of rocuronium. She is edentulous with a class II airway.  The cords were easily visualized with a 4 Miller blade and a 7.5 endotracheal tube passed to 22 cm at the lips.  There was symmetric air movement and positive CO2.  Chest x-ray for placement is pending.

## 2017-08-02 NOTE — H&P (Signed)
PULMONARY / CRITICAL CARE MEDICINE   Name: [name] MRN: [id number] DOB: 29-Jan-1956    ADMISSION DATE:  08/02/2017   CHIEF COMPLAINT: Found down  HISTORY OF PRESENT ILLNESS:        This is a 62 year old diabetic with a history of alcohol and cocaine abuse who is previously been admitted to this hospital for a left thalamic hemorrhage.  She was found down today in a puddle of stool and urine.  She brought to our department emergency medicine where she had overt clinical seizure activity.  She was treated with Keppra fosphenytoin and Ativan in the clinical seizures have subsided.  EEG does not show ongoing seizure activity but shows decreased activity throughout the left hemisphere suggesting a postictal state.  She is unable to give any history and remains obtunded at this time.  Imaging studies did not show a new structural lesion.  She was coughing up purulent material in the department of emergency medicine.  PAST MEDICAL HISTORY :  She  has a past medical history of Cocaine abuse, continuous ([place]), Diabetes mellitus, Hypertension, Insomnia disorder, and Shortness of breath.  PAST SURGICAL HISTORY: She  has a past surgical history that includes Ankle fracture surgery (2007).  No Known Allergies  No current facility-administered medications on file prior to encounter.    Current Outpatient Medications on File Prior to Encounter  Medication Sig  . amLODipine (NORVASC) 10 MG tablet Take 1 tablet (10 mg total) daily by mouth.  . collagenase (SANTYL) ointment Apply topically daily. Apply Santyl to right leg wound Q day, then cover with moist gauze and dry gauze and kerlex  . furosemide (LASIX) 20 MG tablet Take 20 mg by mouth daily.  Marland Kitchen gabapentin (NEURONTIN) 300 MG capsule Take 1 capsule (300 mg total) 3 (three) times daily by mouth.  . insulin aspart protamine- aspart (NOVOLOG MIX 70/30) (70-30) 100 UNIT/ML injection Inject 0.2 mLs (20 Units total) into the skin 2 (two) times daily  with a meal.  . metoprolol tartrate (LOPRESSOR) 25 MG tablet Take 25 mg by mouth 2 (two) times daily.  Marland Kitchen acetaminophen (TYLENOL) 325 MG tablet Take 2 tablets (650 mg total) every 6 (six) hours as needed by mouth for mild pain, fever or headache.  Marland Kitchen atorvastatin (LIPITOR) 40 MG tablet Take 1 tablet (40 mg total) daily at 6 PM by mouth.  . cyclobenzaprine (FLEXERIL) 10 MG tablet Take 1 tablet (10 mg total) at bedtime by mouth.  . folic acid (FOLVITE) 1 MG tablet Take 1 tablet (1 mg total) by mouth daily.  Marland Kitchen levETIRAcetam (KEPPRA) 500 MG tablet Take 1 tablet (500 mg total) 2 (two) times daily by mouth.  . Multiple Vitamin (MULTIVITAMIN WITH MINERALS) TABS tablet Take 1 tablet by mouth daily.  Marland Kitchen nystatin cream (MYCOSTATIN) Apply topically 2 (two) times daily.  Marland Kitchen oxybutynin (DITROPAN-XL) 5 MG 24 hr tablet Take 1 tablet (5 mg total) at bedtime by mouth.  . pantoprazole (PROTONIX) 40 MG tablet Take 1 tablet (40 mg total) by mouth daily.  Marland Kitchen PROAIR HFA 108 (90 Base) MCG/ACT inhaler Inhale 2 puffs into the lungs 4 (four) times daily as needed for wheezing.  . thiamine 100 MG tablet Take 1 tablet (100 mg total) by mouth daily.    FAMILY HISTORY:  Her indicated that her mother is deceased. She indicated that her father is deceased. She indicated that the status of her sister is unknown. She indicated that the status of her brother is unknown.   SOCIAL HISTORY:  She  reports that she has been smoking cigarettes.  She has a 37.50 pack-year smoking history. she has never used smokeless tobacco. She reports that she drinks about 4.8 oz of alcohol per week. She reports that she uses drugs. Drug: Cocaine.  REVIEW OF SYSTEMS:   Not obtainable  SUBJECTIVE:  Not obtainable  VITAL SIGNS: BP 134/82   Pulse (!) 122   Temp 98.8 F (37.1 C) (Rectal)   Resp (!) 25   SpO2 98%   HEMODYNAMICS:    VENTILATOR SETTINGS:    INTAKE / OUTPUT: No intake/output data recorded.  PHYSICAL EXAMINATION: General:  Somewhat obese female who is unresponsive and in no distress. Neuro: He is not responding to voice or sternal rub for me.  She is maintaining her airway adequately.  There was chronic leftward eye deviation.  I do not appreciate any seizure activity at present.  She does not spine to sternal rub.   Cardiovascular: S1 and S2 are rapid and regular with a loud S4 gallop.  There is no murmur.  No dependent edema. Lungs: Respirations are unlabored, there are scattered rhonchi and no wheezes.  There is good air movement throughout. Abdomen: The abdomen is somewhat obese soft and nontender without any guarding or rebound.  Few bowel sounds are present. Musculoskeletal: There is a 4 x 1 cm ulceration on the medial  right ankle.  LABS:  BMET Recent Labs  Lab 08/02/17 1347  NA 134*  K 5.4*  CL 100*  CO2 18*  BUN 13  CREATININE 1.51*  GLUCOSE 616*    Electrolytes Recent Labs  Lab 08/02/17 1347  CALCIUM 8.9    CBC Recent Labs  Lab 08/02/17 1347  WBC 9.1  HGB 16.0*  HCT 47.7*  PLT 276    Coag's No results for input(s): APTT, INR in the last 168 hours.  Sepsis Markers Recent Labs  Lab 08/02/17 1404  LATICACIDVEN 2.23*    ABG No results for input(s): PHART, PCO2ART, PO2ART in the last 168 hours.  Liver Enzymes Recent Labs  Lab 08/02/17 1347  AST 57*  ALT 20  ALKPHOS 55  BILITOT 2.2*  ALBUMIN 2.8*    Cardiac Enzymes No results for input(s): TROPONINI, PROBNP in the last 168 hours.  Glucose Recent Labs  Lab 08/02/17 1319 08/02/17 1708  GLUCAP >600* 567*    Imaging Ct Head Wo Contrast  Result Date: 08/02/2017 CLINICAL DATA:  Altered level of consciousness EXAM: CT HEAD WITHOUT CONTRAST TECHNIQUE: Contiguous axial images were obtained from the base of the skull through the vertex without intravenous contrast. COMPARISON:  CT head 05/26/2017 FINDINGS: Brain: Generalized atrophy without hydrocephalus. Mild chronic microvascular ischemic change in the white  matter. Small chronic infarct in the right medial basal ganglia. Negative for acute hemorrhage. Negative for acute infarct or mass. Left thalamic hematoma on the prior study no longer visualized. Vascular: Negative for hyperdense vessel Skull: Negative Sinuses/Orbits: Negative Other: None IMPRESSION: Atrophy and chronic ischemia.  No acute abnormality. Electronically Signed   By: Franchot Gallo M.D.   On: 08/02/2017 15:40       DISCUSSION:      This is a 62 year old [place] with a history of polysubstance abuse apparently including alcohol and cocaine who had recently admitted his hospital with a thalamic hemorrhage.  She was found down today after an unknown period of time in a pool of urine and stool.  She had overt seizure activity in the emergency room and was loaded with anticonvulsants.  She  is also been found to be hyperglycemic with a borderline anion gap.  ASSESSMENT / PLAN:  PULMONARY A: I have not yet seen a chest film but will cover empirically for perforation and she is coughing purulent sputum at present time.  Blood gas is pending to ensure that there is not some component of CO2 retention contributing to her decreased decreased level of consciousness.  CARDIOVASCULAR A: She has an overt gallop on examination.  Although this may very well be due to her hypertension she has a history of chronic drug abuse and I have ordered a echocardiogram in order to rule out cardiomyopathy.  RENAL A: Creatinine is marginally elevated, will reevaluate after hydration for treatment of DKA.      I am concerned that she was found down after unknown period of time his CPK is pending to rule out rhabdomyolysis  GASTROINTESTINAL A: Prophylaxis is with Protonix  INFECTIOUS A: She is empirically covered with Zosyn for aspiration, the ulceration on the medial side of the right ankle does not appear to be infected  ENDOCRINE A: Hyperglycemia with a marginal anion gap, I have admitted her on the DKA  protocol.  NEUROLOGIC A: Profoundly unresponsive.  I discussed this with neurology who have reviewed her imaging studies and do not feel that she has had another structural CNS event.  Based on the review of the EEG they suspect that she is postictal.  Continuous  EEG monitoring is in place if necessary to suppress seizures we will re-dose with benzodiazepines and intubate if needed.   Greater than 35 minutes was spent in the care of this patient today  Pulmonary and [place] Pager: [phone]  08/02/2017, 5:37 PM

## 2017-08-02 NOTE — ED Notes (Signed)
Pt noted to be tachycardia with tremors to R hand and bilateral jerking eye movement;  Dr Ashok Cordia notified and is at bedside.

## 2017-08-02 NOTE — Consult Note (Addendum)
NEURO HOSPITALIST CONSULT NOTE   Requestig physician: Dr. Ashok Cordia  Reason for Consult: AMS  History obtained from:   Chart     HPI:                                                                                                                                          [name] is an 62 y.o. female with history of cocaine abuse, DM, HTN  Who was last seen on Tuesday and found today by family down, confused in urine and stool. EMS brought patient to ED and CT was obtained showing no change in bleed and positive for cocaine. On arrival to room eyes are deviated to the right, mouth twitching and right arm twitching. Immediately 2 mg Ativan given and 1500 mg fosphenytoin.   She has been seen in the past multiple times for hyperglycemia and cocaine abuse. 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  Non compliant with Keppra.   Past Medical History:  Diagnosis Date  . Cocaine abuse, continuous ([place])   . Diabetes mellitus    Hb A1C = 12.6 on 05/15/11, managed on Novolog 70/30, 35 U qam, 25 U qpm  . Hypertension    poorly controlled  . Insomnia disorder   . Shortness of breath     Past Surgical History:  Procedure Laterality Date  . ANKLE FRACTURE SURGERY  2007    Family History  Problem Relation Age of Onset  . Stroke Father   . Diabetes Sister   . Diabetes Brother    Social History:  reports that she has been smoking cigarettes.  She has a 37.50 pack-year smoking history. she has never used smokeless tobacco. She reports that she drinks about 4.8 oz of alcohol per week. She reports that she uses drugs. Drug: Cocaine.  No Known Allergies  MEDICATIONS:                                                                                                                     Current Facility-Administered Medications  Medication Dose Route Frequency Provider Last Rate Last Dose  . dextrose 5 %-0.45 %  sodium chloride infusion   Intravenous Continuous Lajean Saver, MD      .  fosPHENYtoin (CEREBYX) 1,500 mg PE in sodium chloride 0.9 % 50 mL IVPB  1,500 mg PE Intravenous Once Amie Portland, MD      . insulin regular (NOVOLIN R,HUMULIN R) 100 Units in sodium chloride 0.9 % 100 mL (1 Units/mL) infusion   Intravenous Continuous Lajean Saver, MD 5.4 mL/hr at 08/02/17 1517 5.4 Units/hr at 08/02/17 1517  . [START ON 08/03/2017] levETIRAcetam (KEPPRA) 500 mg in sodium chloride 0.9 % 100 mL IVPB  500 mg Intravenous Q12H Marliss Coots, PA-C       Current Outpatient Medications  Medication Sig Dispense Refill  . acetaminophen (TYLENOL) 325 MG tablet Take 2 tablets (650 mg total) every 6 (six) hours as needed by mouth for mild pain, fever or headache. 40 tablet 0  . amLODipine (NORVASC) 10 MG tablet Take 1 tablet (10 mg total) daily by mouth. (Patient not taking: Reported on 05/25/2017) 30 tablet 1  . atorvastatin (LIPITOR) 40 MG tablet Take 1 tablet (40 mg total) daily at 6 PM by mouth. 30 tablet 1  . collagenase (SANTYL) ointment Apply topically daily. Apply Santyl to right leg wound Q day, then cover with moist gauze and dry gauze and kerlex 15 g 0  . cyclobenzaprine (FLEXERIL) 10 MG tablet Take 1 tablet (10 mg total) at bedtime by mouth. 30 tablet 0  . folic acid (FOLVITE) 1 MG tablet Take 1 tablet (1 mg total) by mouth daily. 30 tablet 0  . gabapentin (NEURONTIN) 300 MG capsule Take 1 capsule (300 mg total) 3 (three) times daily by mouth. 90 capsule 1  . insulin aspart protamine- aspart (NOVOLOG MIX 70/30) (70-30) 100 UNIT/ML injection Inject 0.2 mLs (20 Units total) into the skin 2 (two) times daily with a meal.    . levETIRAcetam (KEPPRA) 500 MG tablet Take 1 tablet (500 mg total) 2 (two) times daily by mouth. 60 tablet 1  . Multiple Vitamin (MULTIVITAMIN WITH MINERALS) TABS tablet Take 1 tablet by mouth daily.    Marland Kitchen nystatin cream (MYCOSTATIN) Apply topically 2 (two) times daily. 30 g 0  . oxybutynin  (DITROPAN-XL) 5 MG 24 hr tablet Take 1 tablet (5 mg total) at bedtime by mouth. 30 tablet 0  . pantoprazole (PROTONIX) 40 MG tablet Take 1 tablet (40 mg total) by mouth daily.    Marland Kitchen PROAIR HFA 108 (90 Base) MCG/ACT inhaler Inhale 2 puffs into the lungs 4 (four) times daily as needed for wheezing.  0  . thiamine 100 MG tablet Take 1 tablet (100 mg total) by mouth daily. 30 tablet 0   ROS:                                                                                                                                       History obtained from unobtainable from patient due to mental status Blood pressure (!) 194/109, pulse (!) 149, temperature 98.8 F (37.1 C), temperature source Rectal,  resp. rate (!) 24, SpO2 98 %. General Examination:                                                                                                      Physical Exam  HEENT-  Normocephalic, no lesions, without obvious abnormality.  Normal external eye and conjunctiva.   Cardiovascular- S1-S2 audible, pulses palpable throughout   Lungs-no rhonchi or wheezing noted, no excessive working breathing.  Saturations within normal limits Abdomen- All 4 quadrants palpated and nontender Extremities- Warm, dry and intact Musculoskeletal-no joint tenderness, deformity or swelling Skin-warm and dry, no hyperpigmentation, vitiligo, or suspicious lesions Neurological Examination Mental Status: AMS due to seizures Cranial Nerves: II: No blink to threat III,IV, VI: eyes at times deviated to the right and then goes to the left--back and forth at times--likely seizure with post ictal phenomenon.  V,VII:face symmetric with mouth twitching Motor: Not moving extremities with right arm and legs increased tone.  Sensory: no response to pain Deep Tendon Reflexes: 2+ and symmetric throughout Plantars: Right: downgoing   Left: downgoing  Lab Results: Basic Metabolic Panel: Recent Labs  Lab 08/02/17 1347  NA 134*  K 5.4*  CL  100*  CO2 18*  GLUCOSE 616*  BUN 13  CREATININE 1.51*  CALCIUM 8.9    Liver Function Tests: Recent Labs  Lab 08/02/17 1347  AST 57*  ALT 20  ALKPHOS 55  BILITOT 2.2*  PROT 7.4  ALBUMIN 2.8*   CBC: Recent Labs  Lab 08/02/17 1347  WBC 9.1  HGB 16.0*  HCT 47.7*  MCV 95.4  PLT 276   CBG: Recent Labs  Lab 08/02/17 1319  GLUCAP >600*   Imaging: Ct Head Wo Contrast  Result Date: 08/02/2017 CLINICAL DATA:  Altered level of consciousness EXAM: CT HEAD WITHOUT CONTRAST TECHNIQUE: Contiguous axial images were obtained from the base of the skull through the vertex without intravenous contrast. COMPARISON:  CT head 05/26/2017 FINDINGS: Brain: Generalized atrophy without hydrocephalus. Mild chronic microvascular ischemic change in the white matter. Small chronic infarct in the right medial basal ganglia. Negative for acute hemorrhage. Negative for acute infarct or mass. Left thalamic hematoma on the prior study no longer visualized. Vascular: Negative for hyperdense vessel Skull: Negative Sinuses/Orbits: Negative Other: None IMPRESSION: Atrophy and chronic ischemia.  No acute abnormality. Electronically Signed   By: Franchot Gallo M.D.   On: 08/02/2017 15:40   Assessment and plan per attending neurologist  Etta Quill PA-C [facility] [phone]  08/02/2017, 4:07 PM  Attending Neurohospitalist Addendum Patient seen and examined with APP/Resident. Agree with the history and physical as documented above.  Assessment/Plan: [name] with Glucose 616, cocaine positive, non-compliant with Keppra found down in feces and urine today--last seen normal on Tuesday.  In ED f suspicion for clinical status epilepticus.  Loaded with Kppra 1 gram  After our evaluation, we recommended load with fosphenytoin 1500. She has received total of 3 mg of Ativan.  We recommended an additional 1 mg followed by an additional 1 mg of Ativan if needed. EEG called STAT.  Preliminary read  shows left-sided slowing more than right-sided slowing. Formal read of the EEG pending. Lab studies reveal multiple metabolic derangements including abnormal renal and liver function tests.  No leukocytosis.  Impression -Breakthrough provoked seizures in the setting of noncompliance to medications, hypertension hyperglycemia and cocaine abuse. -Possible status epilepticus -Cocaine abuse -Hyperglycemia-uncontrolled -Uncontrolled hypertension- another differential could then be posterior reversible encephalopathy syndrome causing seizures and current presentation of altered mental status.  RECS: -Load with dilantin (pending at the time of dicatation) -C/W Keppra 500 BID and Dilantin 100 TID -LTM EEG -Check ammonia given deranged LFTs -Low threshold for intubation as patient is currently very obtunded. -Maintain seizure precautions -Consider repeating chest x-ray for concern for aspiration -Given her similar presentations in the past, I do not think a spinal tap will be high yield and she does not have any leukocytosis at this time or fevers.  I will hold off on spinal tap for now. -MRI brain without contrast when stable to evaluate for PRES -Gradual reduction of blood pressure by 10-20% daily. -We will follow with you.  --- Amie Portland, MD [facility] Pager: [phone]  If 7pm to 7am, please call on call as listed on AMION.  CRITICAL CARE ATTESTATION This patient is critically ill and at significant risk of neurological worsening, death and care requires constant monitoring of vital signs, hemodynamics,respiratory and cardiac monitoring. I spent 60  minutes of neurocritical care time performing neurological assessment, discussion with family, other specialists and medical decision making of high complexityin the care of  this patient.

## 2017-08-02 NOTE — Progress Notes (Signed)
With further review, I think that the slowing on the left has epileptiform discharges embedded at times. I would treat more as a PLEDs-plus pattern with concern for possible ongoing seizure. She is already intubated, so I think that using propofol to suppress this pattern would be prudent given this concern.   Roland Rack, MD [facility] [phone]  If 7pm- 7am, please page neurology on call as listed in [place].

## 2017-08-02 NOTE — ED Notes (Signed)
Pt prepped for intubation.  Dr. Pearline Cables, respiratory, pharmacy, RN at bedside.

## 2017-08-02 NOTE — ED Notes (Signed)
Checked patient cbg it was greater than 600 notified RN Chrislyn of blood sugar

## 2017-08-02 NOTE — Procedures (Signed)
EEG Report  Clinical History:  Found down in urine and stool with confusion.  Recent history of cocaine induced left thalamic hemorrhage which has resorbed on current CT.  Technical Summary:  A 19 channel digital EEG recording was performed using the 10-20 international system of electrode placement.  Bipolar and Referential montages were used.  The total recording time was approx 20 minutes.  Findings:  There is no posterior dominant alpha rhythm.  Background frequencies are in the 6 Hz range on the right hemisphere and less than 6 Hz in the left hemisphere.  There is a prominent high amplitude delta/theta frequency  focal slowing in the left temporal lobe.  There are no epileptiform discharges or electrographic seizures present.  Impression:  This is an abnormal EEG.  There is evidence of moderate generalized slowing of brain activity as well as severe focal slowing in the left temporal lobe.  The patient is not in non-convulsive status epilepticus.  Clinical correlation is recommended.    Rogue Jury, MS, MD

## 2017-08-02 NOTE — ED Notes (Signed)
IV team is at bedside attempting to obtain larger IV. This RN obtained small 24 G in right hand.

## 2017-08-03 ENCOUNTER — Inpatient Hospital Stay (HOSPITAL_COMMUNITY): Payer: Medicaid Other

## 2017-08-03 DIAGNOSIS — E1310 Other specified diabetes mellitus with ketoacidosis without coma: Secondary | ICD-10-CM

## 2017-08-03 DIAGNOSIS — I429 Cardiomyopathy, unspecified: Secondary | ICD-10-CM

## 2017-08-03 DIAGNOSIS — G40901 Epilepsy, unspecified, not intractable, with status epilepticus: Secondary | ICD-10-CM

## 2017-08-03 DIAGNOSIS — F1410 Cocaine abuse, uncomplicated: Secondary | ICD-10-CM

## 2017-08-03 DIAGNOSIS — R4182 Altered mental status, unspecified: Secondary | ICD-10-CM | POA: Diagnosis present

## 2017-08-03 LAB — GLUCOSE, CAPILLARY
GLUCOSE-CAPILLARY: 116 mg/dL — AB (ref 65–99)
GLUCOSE-CAPILLARY: 131 mg/dL — AB (ref 65–99)
GLUCOSE-CAPILLARY: 145 mg/dL — AB (ref 65–99)
GLUCOSE-CAPILLARY: 157 mg/dL — AB (ref 65–99)
GLUCOSE-CAPILLARY: 157 mg/dL — AB (ref 65–99)
GLUCOSE-CAPILLARY: 172 mg/dL — AB (ref 65–99)
GLUCOSE-CAPILLARY: 177 mg/dL — AB (ref 65–99)
Glucose-Capillary: 102 mg/dL — ABNORMAL HIGH (ref 65–99)
Glucose-Capillary: 126 mg/dL — ABNORMAL HIGH (ref 65–99)
Glucose-Capillary: 126 mg/dL — ABNORMAL HIGH (ref 65–99)
Glucose-Capillary: 134 mg/dL — ABNORMAL HIGH (ref 65–99)
Glucose-Capillary: 216 mg/dL — ABNORMAL HIGH (ref 65–99)
Glucose-Capillary: 256 mg/dL — ABNORMAL HIGH (ref 65–99)

## 2017-08-03 LAB — BASIC METABOLIC PANEL
ANION GAP: 10 (ref 5–15)
ANION GAP: 11 (ref 5–15)
ANION GAP: 12 (ref 5–15)
Anion gap: 12 (ref 5–15)
BUN: 13 mg/dL (ref 6–20)
BUN: 13 mg/dL (ref 6–20)
BUN: 13 mg/dL (ref 6–20)
BUN: 14 mg/dL (ref 6–20)
CHLORIDE: 108 mmol/L (ref 101–111)
CHLORIDE: 112 mmol/L — AB (ref 101–111)
CHLORIDE: 112 mmol/L — AB (ref 101–111)
CHLORIDE: 115 mmol/L — AB (ref 101–111)
CO2: 17 mmol/L — ABNORMAL LOW (ref 22–32)
CO2: 20 mmol/L — ABNORMAL LOW (ref 22–32)
CO2: 21 mmol/L — ABNORMAL LOW (ref 22–32)
CO2: 23 mmol/L (ref 22–32)
CREATININE: 1.76 mg/dL — AB (ref 0.44–1.00)
Calcium: 8 mg/dL — ABNORMAL LOW (ref 8.9–10.3)
Calcium: 8.1 mg/dL — ABNORMAL LOW (ref 8.9–10.3)
Calcium: 8.2 mg/dL — ABNORMAL LOW (ref 8.9–10.3)
Calcium: 8.6 mg/dL — ABNORMAL LOW (ref 8.9–10.3)
Creatinine, Ser: 1.41 mg/dL — ABNORMAL HIGH (ref 0.44–1.00)
Creatinine, Ser: 1.6 mg/dL — ABNORMAL HIGH (ref 0.44–1.00)
Creatinine, Ser: 1.73 mg/dL — ABNORMAL HIGH (ref 0.44–1.00)
GFR calc Af Amer: 39 mL/min — ABNORMAL LOW (ref >60)
GFR calc Af Amer: 46 mL/min — ABNORMAL LOW (ref >60)
GFR calc non Af Amer: 40 mL/min — ABNORMAL LOW (ref >60)
GFR calc non Af Amer: 31 mL/min — ABNORMAL LOW (ref >60)
GFR calc non Af Amer: 30 mL/min — ABNORMAL LOW (ref >60)
GFR, EST AFRICAN AMERICAN: 36 mL/min — AB (ref >60)
GFR, EST AFRICAN AMERICAN: 35 mL/min — AB (ref >60)
GFR, EST NON AFRICAN AMERICAN: 34 mL/min — AB (ref >60)
Glucose, Bld: 110 mg/dL — ABNORMAL HIGH (ref 65–99)
Glucose, Bld: 142 mg/dL — ABNORMAL HIGH (ref 65–99)
Glucose, Bld: 181 mg/dL — ABNORMAL HIGH (ref 65–99)
Glucose, Bld: 251 mg/dL — ABNORMAL HIGH (ref 65–99)
POTASSIUM: 2.6 mmol/L — AB (ref 3.5–5.1)
POTASSIUM: 2.8 mmol/L — AB (ref 3.5–5.1)
POTASSIUM: 3 mmol/L — AB (ref 3.5–5.1)
POTASSIUM: 3.5 mmol/L (ref 3.5–5.1)
SODIUM: 140 mmol/L (ref 135–145)
SODIUM: 146 mmol/L — AB (ref 135–145)
SODIUM: 146 mmol/L — AB (ref 135–145)
Sodium: 141 mmol/L (ref 135–145)

## 2017-08-03 LAB — PHOSPHORUS
PHOSPHORUS: 3.2 mg/dL (ref 2.5–4.6)
Phosphorus: 1.6 mg/dL — ABNORMAL LOW (ref 2.5–4.6)

## 2017-08-03 LAB — CBC WITH DIFFERENTIAL/PLATELET
BASOS PCT: 0 %
Basophils Absolute: 0 10*3/uL (ref 0.0–0.1)
EOS ABS: 0.1 10*3/uL (ref 0.0–0.7)
EOS PCT: 0 %
HCT: 38.2 % (ref 36.0–46.0)
Hemoglobin: 12.3 g/dL (ref 12.0–15.0)
LYMPHS ABS: 2.8 10*3/uL (ref 0.7–4.0)
Lymphocytes Relative: 20 %
MCH: 31 pg (ref 26.0–34.0)
MCHC: 32.2 g/dL (ref 30.0–36.0)
MCV: 96.2 fL (ref 78.0–100.0)
MONOS PCT: 8 %
Monocytes Absolute: 1.1 10*3/uL — ABNORMAL HIGH (ref 0.1–1.0)
Neutro Abs: 9.9 10*3/uL — ABNORMAL HIGH (ref 1.7–7.7)
Neutrophils Relative %: 72 %
PLATELETS: 269 10*3/uL (ref 150–400)
RBC: 3.97 MIL/uL (ref 3.87–5.11)
RDW: 14.2 % (ref 11.5–15.5)
WBC: 13.9 10*3/uL — AB (ref 4.0–10.5)

## 2017-08-03 LAB — ECHOCARDIOGRAM LIMITED
E decel time: 289 msec
EERAT: 23.36
FS: 40 % (ref 28–44)
Height: 65 in
IVS/LV PW RATIO, ED: 0.89
LA diam end sys: 23 mm
LADIAMINDEX: 1.17 cm/m2
LASIZE: 23 mm
LDCA: 2.27 cm2
LV E/e' medial: 23.36
LV E/e'average: 23.36
LV TDI E'MEDIAL: 3.77
LV e' LATERAL: 4.28 cm/s
LVOT diameter: 17 mm
MV Dec: 289
MV pk A vel: 80 m/s
MVPG: 4 mmHg
MVPKEVEL: 100 m/s
PW: 11.8 mm — AB (ref 0.6–1.1)
TDI e' lateral: 4.28
Weight: 3174.62 oz

## 2017-08-03 LAB — HEPATITIS PANEL, ACUTE
HCV Ab: >11 s/co ratio — ABNORMAL HIGH (ref 0.0–0.9)
HEP A IGM: NEGATIVE
HEP B C IGM: NEGATIVE
HEP B S AG: NEGATIVE

## 2017-08-03 LAB — PHENYTOIN LEVEL, TOTAL: Phenytoin Lvl: 9.5 ug/mL — ABNORMAL LOW (ref 10.0–20.0)

## 2017-08-03 LAB — MRSA PCR SCREENING: MRSA BY PCR: NEGATIVE

## 2017-08-03 LAB — MAGNESIUM
MAGNESIUM: 2.5 mg/dL — AB (ref 1.7–2.4)
Magnesium: 1.4 mg/dL — ABNORMAL LOW (ref 1.7–2.4)

## 2017-08-03 MED ORDER — SODIUM CHLORIDE 0.9% FLUSH
10.0000 mL | Freq: Two times a day (BID) | INTRAVENOUS | Status: DC
  Administered 2017-08-03 – 2017-08-05 (×6): 10 mL
  Administered 2017-08-06 – 2017-08-07 (×2): 20 mL
  Administered 2017-08-07 – 2017-08-09 (×6): 10 mL
  Administered 2017-08-10: 20 mL
  Administered 2017-08-11 – 2017-08-12 (×2): 10 mL
  Administered 2017-08-13: 20 mL
  Administered 2017-08-14: 10 mL

## 2017-08-03 MED ORDER — POTASSIUM CHLORIDE 20 MEQ/15ML (10%) PO SOLN
40.0000 meq | Freq: Every day | ORAL | Status: DC
  Administered 2017-08-03 – 2017-08-04 (×2): 40 meq
  Filled 2017-08-03 (×3): qty 30

## 2017-08-03 MED ORDER — VITAL HIGH PROTEIN PO LIQD
1000.0000 mL | ORAL | Status: DC
  Administered 2017-08-03 – 2017-08-04 (×2): 1000 mL

## 2017-08-03 MED ORDER — NOREPINEPHRINE BITARTRATE 1 MG/ML IV SOLN
0.0000 ug/min | INTRAVENOUS | Status: DC
  Administered 2017-08-03: 2 ug/min via INTRAVENOUS
  Filled 2017-08-03 (×2): qty 4

## 2017-08-03 MED ORDER — PRO-STAT SUGAR FREE PO LIQD
60.0000 mL | Freq: Three times a day (TID) | ORAL | Status: DC
  Administered 2017-08-03 – 2017-08-08 (×15): 60 mL
  Filled 2017-08-03 (×16): qty 60

## 2017-08-03 MED ORDER — MIDAZOLAM BOLUS VIA INFUSION
10.0000 mg | Freq: Once | INTRAVENOUS | Status: AC
  Administered 2017-08-03: 10 mg via INTRAVENOUS

## 2017-08-03 MED ORDER — POTASSIUM CHLORIDE 10 MEQ/50ML IV SOLN
10.0000 meq | INTRAVENOUS | Status: AC
  Administered 2017-08-03 (×4): 10 meq via INTRAVENOUS
  Filled 2017-08-03 (×4): qty 50

## 2017-08-03 MED ORDER — SODIUM CHLORIDE 0.9% FLUSH: 10.0000 mL | INTRAVENOUS | Status: DC | PRN

## 2017-08-03 MED ORDER — MAGNESIUM SULFATE 4 GM/100ML IV SOLN
4.0000 g | Freq: Once | INTRAVENOUS | Status: AC
  Administered 2017-08-03: 4 g via INTRAVENOUS
  Filled 2017-08-03: qty 100

## 2017-08-03 MED ORDER — MIDAZOLAM BOLUS VIA INFUSION
10.0000 mg | Freq: Once | INTRAVENOUS | Status: DC
  Filled 2017-08-03: qty 10

## 2017-08-03 MED ORDER — CHLORHEXIDINE GLUCONATE 0.12% ORAL RINSE (MEDLINE KIT)
15.0000 mL | Freq: Two times a day (BID) | OROMUCOSAL | Status: DC
  Administered 2017-08-03 – 2017-08-14 (×24): 15 mL via OROMUCOSAL

## 2017-08-03 MED ORDER — INSULIN GLARGINE 100 UNIT/ML ~~LOC~~ SOLN
8.0000 [IU] | Freq: Every day | SUBCUTANEOUS | Status: DC
  Administered 2017-08-03 – 2017-08-05 (×3): 8 [IU] via SUBCUTANEOUS
  Filled 2017-08-03 (×4): qty 0.08

## 2017-08-03 MED ORDER — SODIUM CHLORIDE 0.9 % IV SOLN
0.0000 ug/min | INTRAVENOUS | Status: DC
  Filled 2017-08-03: qty 1

## 2017-08-03 MED ORDER — CHLORHEXIDINE GLUCONATE CLOTH 2 % EX PADS
6.0000 | MEDICATED_PAD | Freq: Every day | CUTANEOUS | Status: DC
  Administered 2017-08-03 – 2017-08-15 (×12): 6 via TOPICAL

## 2017-08-03 MED ORDER — POTASSIUM PHOSPHATES 15 MMOLE/5ML IV SOLN
20.0000 meq | Freq: Once | INTRAVENOUS | Status: AC
  Administered 2017-08-03: 20 meq via INTRAVENOUS
  Filled 2017-08-03: qty 4.55

## 2017-08-03 MED ORDER — INSULIN ASPART 100 UNIT/ML ~~LOC~~ SOLN
0.0000 [IU] | SUBCUTANEOUS | Status: DC
  Administered 2017-08-03: 5 [IU] via SUBCUTANEOUS
  Administered 2017-08-03 (×3): 1 [IU] via SUBCUTANEOUS
  Administered 2017-08-03: 2 [IU] via SUBCUTANEOUS
  Administered 2017-08-04: 1 [IU] via SUBCUTANEOUS
  Administered 2017-08-04 (×4): 2 [IU] via SUBCUTANEOUS
  Administered 2017-08-04: 1 [IU] via SUBCUTANEOUS
  Administered 2017-08-05 (×2): 2 [IU] via SUBCUTANEOUS
  Administered 2017-08-05 (×2): 3 [IU] via SUBCUTANEOUS
  Administered 2017-08-05 – 2017-08-06 (×3): 2 [IU] via SUBCUTANEOUS
  Administered 2017-08-06 (×2): 3 [IU] via SUBCUTANEOUS

## 2017-08-03 MED ORDER — POTASSIUM CHLORIDE 20 MEQ/15ML (10%) PO SOLN
40.0000 meq | Freq: Two times a day (BID) | ORAL | Status: AC
  Administered 2017-08-03 – 2017-08-04 (×2): 40 meq
  Filled 2017-08-03 (×2): qty 30

## 2017-08-03 MED ORDER — ORAL CARE MOUTH RINSE
15.0000 mL | Freq: Four times a day (QID) | OROMUCOSAL | Status: DC
  Administered 2017-08-03 – 2017-08-14 (×46): 15 mL via OROMUCOSAL

## 2017-08-03 MED ORDER — DEXTROSE IN LACTATED RINGERS 5 % IV SOLN
INTRAVENOUS | Status: DC
  Administered 2017-08-03: 05:00:00 via INTRAVENOUS

## 2017-08-03 MED ORDER — SODIUM CHLORIDE 0.9 % IV SOLN
1000.0000 mg | Freq: Once | INTRAVENOUS | Status: AC
  Administered 2017-08-03: 1000 mg via INTRAVENOUS
  Filled 2017-08-03: qty 10

## 2017-08-03 MED ORDER — MUPIROCIN CALCIUM 2 % EX CREA
TOPICAL_CREAM | Freq: Every day | CUTANEOUS | Status: DC
Start: 2017-08-03 — End: 2017-09-14
  Administered 2017-08-03: 16:00:00 via TOPICAL
  Administered 2017-08-04 – 2017-08-09 (×6): 1 via TOPICAL
  Administered 2017-08-10 – 2017-08-28 (×19): via TOPICAL
  Administered 2017-08-29: 1 via TOPICAL
  Administered 2017-08-30 – 2017-08-31 (×2): via TOPICAL
  Administered 2017-09-01: 1 via TOPICAL
  Administered 2017-09-02 – 2017-09-05 (×3): via TOPICAL
  Administered 2017-09-06 – 2017-09-09 (×4): 1 via TOPICAL
  Administered 2017-09-10 – 2017-09-14 (×5): via TOPICAL
  Filled 2017-08-03 (×4): qty 15

## 2017-08-03 NOTE — Progress Notes (Signed)
  Echocardiogram 2D Echocardiogram has been performed.  [name] 08/03/2017, 9:09 AM

## 2017-08-03 NOTE — Progress Notes (Signed)
Neurology Progress Note   S:// Patient seen and examined. Long-term EEG reports multiple radiographic seizures overnight.   O:// Current vital signs: BP 114/82   Pulse 65   Temp (!) 94.5 F (34.7 C) (Rectal) Comment: nurse witnessed   Resp 18   Ht '5\' 5"'$  (1.651 m)   Wt 90 kg (198 lb 6.6 oz)   SpO2 100%   BMI 33.02 kg/m  Vital signs in last 24 hours: Temp:  [94.5 F (34.7 C)-97.8 F (36.6 C)] 94.5 F (34.7 C) (01/25 1131) Pulse Rate:  [58-149] 65 (01/25 1145) Resp:  [16-29] 18 (01/25 1145) BP: (62-194)/(47-109) 114/82 (01/25 1145) SpO2:  [93 %-100 %] 100 % (01/25 1145) FiO2 (%):  [40 %] 40 % (01/25 1110) Weight:  [88.3 kg (194 lb 10.7 oz)-94.3 kg (207 lb 14.3 oz)] 90 kg (198 lb 6.6 oz) (01/25 0500) Limited neurological exam due to sedation on propofol and Versed Patient sedated intubated No purposeful movements Pupils are equal round reactive to light without any gaze preference Corneals mildly reactive Oculocephalics negative No withdrawal on noxious stimulus in either extremity.  Medications  Current Facility-Administered Medications:  .  0.9 %  sodium chloride infusion, 250 mL, Intravenous, PRN, Sampson Goon, MD .  acetaminophen (TYLENOL) tablet 325 mg, 325 mg, Oral, Q4H PRN, Sampson Goon, MD .  chlorhexidine gluconate (MEDLINE KIT) (PERIDEX) 0.12 % solution 15 mL, 15 mL, Mouth Rinse, BID, Sampson Goon, MD, 15 mL at 08/03/17 0742 .  Chlorhexidine Gluconate Cloth 2 % PADS 6 each, 6 each, Topical, Daily, Sampson Goon, MD, 6 each at 08/03/17 1108 .  feeding supplement (PRO-STAT SUGAR FREE 64) liquid 60 mL, 60 mL, Per Tube, TID, Nelda Marseille, Lloyd Huger, MD .  feeding supplement (VITAL HIGH PROTEIN) liquid 1,000 mL, 1,000 mL, Per Tube, Q24H, Rush Farmer, MD, 1,000 mL at 08/03/17 1346 .  heparin injection 5,000 Units, 5,000 Units, Subcutaneous, Q8H, Sampson Goon, MD, 5,000 Units at 08/03/17 1416 .  insulin aspart (novoLOG) injection 0-9 Units, 0-9 Units,  Subcutaneous, Q4H, Jule Ser, DO, 5 Units at 08/03/17 1132 .  insulin glargine (LANTUS) injection 8 Units, 8 Units, Subcutaneous, Daily, Jule Ser, DO, 8 Units at 08/03/17 0510 .  lacosamide (VIMPAT) 100 mg in sodium chloride 0.9 % 25 mL IVPB, 100 mg, Intravenous, Q12H, Greta Doom, MD, Stopped at 08/03/17 0848 .  levETIRAcetam (KEPPRA) 500 mg in sodium chloride 0.9 % 100 mL IVPB, 500 mg, Intravenous, Q12H, Marliss Coots, PA-C, Stopped at 08/03/17 7591 .  MEDLINE mouth rinse, 15 mL, Mouth Rinse, QID, Sampson Goon, MD, 15 mL at 08/03/17 1127 .  midazolam (VERSED) 100 mg in sodium chloride 0.9 % 100 mL (1 mg/mL) infusion, 10 mg/hr, Intravenous, Continuous, Greta Doom, MD, Last Rate: 10 mL/hr at 08/03/17 1335, 10 mg/hr at 08/03/17 1335 .  midazolam (VERSED) bolus via infusion 10 mg, 10 mg, Intravenous, Once, Harbrecht, Lawrence, MD .  mupirocin cream (BACTROBAN) 2 %, , Topical, Daily, Sampson Goon, MD .  norepinephrine (LEVOPHED) 4 mg in dextrose 5 % 250 mL (0.016 mg/mL) infusion, 0-40 mcg/min, Intravenous, Titrated, Mauri Brooklyn, MD, Last Rate: 15 mL/hr at 08/03/17 1144, 4 mcg/min at 08/03/17 1144 .  pantoprazole (PROTONIX) injection 40 mg, 40 mg, Intravenous, QHS, Sampson Goon, MD, 40 mg at 08/02/17 2354 .  phenylephrine (NEO-SYNEPHRINE) 10 mg in sodium chloride 0.9 % 250 mL (0.04 mg/mL) infusion, 0-400 mcg/min, Intravenous, Titrated, Winfrey, Jenne Pane, MD .  phenytoin (DILANTIN)  injection 100 mg, 100 mg, Intravenous, Q8H, Amie Portland, MD, 100 mg at 08/03/17 1411 .  potassium chloride 20 MEQ/15ML (10%) solution 40 mEq, 40 mEq, Per Tube, Daily, Kathi Ludwig, MD, 40 mEq at 08/03/17 0740 .  propofol (DIPRIVAN) 1000 MG/100ML infusion, 130 mcg/kg/min, Intravenous, Titrated, Greta Doom, MD, Last Rate: 73.6 mL/hr at 08/03/17 1127, 130 mcg/kg/min at 08/03/17 1127 .  sodium chloride flush (NS) 0.9 % injection 10-40 mL, 10-40 mL, Intracatheter, Q12H,  Sampson Goon, MD, 10 mL at 08/03/17 0911 .  sodium chloride flush (NS) 0.9 % injection 10-40 mL, 10-40 mL, Intracatheter, PRN, Sampson Goon, MD .  thiamine (B-1) injection 100 mg, 100 mg, Intravenous, Daily, Sampson Goon, MD, 100 mg at 08/03/17 0913 Labs CBC    Component Value Date/Time   WBC 13.9 (H) 08/03/2017 0303   RBC 3.97 08/03/2017 0303   HGB 12.3 08/03/2017 0303   HCT 38.2 08/03/2017 0303   PLT 269 08/03/2017 0303   MCV 96.2 08/03/2017 0303   MCH 31.0 08/03/2017 0303   MCHC 32.2 08/03/2017 0303   RDW 14.2 08/03/2017 0303   LYMPHSABS 2.8 08/03/2017 0303   MONOABS 1.1 (H) 08/03/2017 0303   EOSABS 0.1 08/03/2017 0303   BASOSABS 0.0 08/03/2017 0303    CMP     Component Value Date/Time   NA 140 08/03/2017 1100   K 3.5 08/03/2017 1100   CL 108 08/03/2017 1100   CO2 20 (L) 08/03/2017 1100   GLUCOSE 251 (H) 08/03/2017 1100   BUN 13 08/03/2017 1100   CREATININE 1.73 (H) 08/03/2017 1100   CREATININE 1.28 (H) 03/27/2017 1615   CALCIUM 8.0 (L) 08/03/2017 1100   PROT 7.4 08/02/2017 1347   ALBUMIN 2.8 (L) 08/02/2017 1347   AST 57 (H) 08/02/2017 1347   ALT 20 08/02/2017 1347   ALKPHOS 55 08/02/2017 1347   BILITOT 2.2 (H) 08/02/2017 1347   GFRNONAA 31 (L) 08/03/2017 1100   GFRNONAA 45 (L) 03/27/2017 1615   GFRAA 36 (L) 08/03/2017 1100   GFRAA 53 (L) 03/27/2017 1615    Imaging I have reviewed images in epic and the results pertinent to this consultation are: CT-scan of the brain no acute changes MRI examination of the brain-no acute changes.  Remote bilateral thalamic and brainstem infarcts.  Remote left thalamic and left pontine hemorrhage.  Assessment:  62 year old woman with hyperglycemia, caput cocaine abuse, noncompliant with Keppra seen for concern of status epilepticus. Initial spot EEG negative for seizures.  Long-term EEG showed multiple radiographic seizures. She is currently on Keppra, Dilantin and Vimpat that was added overnight along with being on  propofol 130 and Versed 10 an hour. She had a tumultuous course overnight with increasing sedation that Dropping her blood pressures.  Finally, the dose of propofol at 130 and Versed at 10 an hour, her pressures were steady with the pressors support. Because of the given laterality of her EEG, it will be prudent to image her further, for that reason I got a MRI brain which does not explain the focality on EEG.  MRI is not consistent with PRES.   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  Recommendations: Continue with continuous propofol and Versed at the current doses. Continue with Keppra Vimpat and Dilantin at current doses. Maintain seizure precautions Given that there is no structural abnormality on the MRI, I would treat her aggressively with burst suppression at least for 24-48 hours. We will continue to follow with you.  --  Amie Portland, MD [facility] Pager: [phone] If 7pm to 7am, please call on call as listed on AMION.  CRITICAL CARE ATTESTATION This patient is critically ill and at significant risk of neurological worsening, death and care requires constant monitoring of vital signs, hemodynamics,respiratory and cardiac monitoring. I spent 35  minutes of neurocritical care time performing neurological assessment, discussion with family, other specialists and medical decision making of high complexityin the care of  this patient.

## 2017-08-03 NOTE — Progress Notes (Signed)
This RN has drawn BMP x 2 and lab informed me both times the specimen has hemolyzed. Third blood specimen has just been drawn and send down to the lab.

## 2017-08-03 NOTE — ED Notes (Signed)
Pt has eyes open but will not follow commands and is non verbal, Pt responds to pain. Pt is coughing and at times coughs up thick yellow sputum.

## 2017-08-03 NOTE — Progress Notes (Signed)
CRITICAL VALUE ALERT  Critical Value:  K+ 2.6  Date & Time Notied: 08/03/2017 0440  Provider Notified: Resident A. Juleen China  Orders Received/Actions taken: see MAR

## 2017-08-03 NOTE — Procedures (Signed)
  Electroencephalogram report- LTM     Data acquisition: 10-20 electrode placement.  Additional T1, T2, and EKG electrodes; 26 channel digital referential acquisition reformatted to 18 channel/7 channel coronal bipolar     Spike detection: ON     Seizure detection: ON   Beginning time: 08/02/2017 at 39 Ending time: 08/03/2017 at 8:05 AM  CPT: 95951 Day of study: Dated 1   This 14 hours of intensive EEG monitoring with simultaneous video monitoring was performed for this patient with seizures to rule out subclinical electrographic seizures. Patient is intubated and sedated. Medications: As per EMR  Background activity is marked by background activity slowing was more prominent polymorphic delta slowing across the left frontotemporal region.  Superimposed near continuous left anterior temporal sharp wave spikes and left periodic lateralized epileptiform discharges in that region present throughout the recording.  In addition there is brief electrographic seizures also noted arising from the left frontotemporal cortex.  Gradually patient develops a burst suppression pattern however left anterior temporal spikes persist.  During the last couple hours of recording background activity is marked by significant attenuation of background activities occasionally punctuated with left anterior temporal spikes.  Clinical interpretation: This 14 hours of intensive EEG monitoring with simultaneous video monitoring recorded multiple electrographic seizures arising from left frontotemporal cortex with gradual resolution.  Eventually background activities evolved into burst suppression pattern however gradually becomes more continuous and attenuated.  These findings could be related to sedation and decreasing sedation would be recommended to maintain burst suppression pattern.  In addition there is a neural dysfunction and cortical irritability in the left frontotemporal cortex.  Clinical correlation is  advised.

## 2017-08-03 NOTE — Progress Notes (Signed)
Pt received 10 mg Versed bolus over 2 minutes every 5 minutes  x 3 per verbal order by MD Leonel Ramsay.  Pt received a total of 30 mg Versed over 16 minutes.

## 2017-08-03 NOTE — Progress Notes (Signed)
Pts HR noted to be sustained in the 50s. Dr. Ursula Beath informed. EKG ordered.

## 2017-08-03 NOTE — Progress Notes (Signed)
Pt was reconnected to LTM EEG. No skin breakdown

## 2017-08-03 NOTE — Progress Notes (Signed)
With attempted uptitrating propofol, patient has repeatedly become hypotensive.  I have asked for some type of assistance with blood pressure support.  I would favor adding midazolam as this may allow [place] to use lower doses of propofol to achieve suppression.  This will be carried out once pressors are at the bedside.  This patient is critically ill and at significant risk of neurological worsening, death and care requires constant monitoring of vital signs, hemodynamics,respiratory and cardiac monitoring, neurological assessment, discussion with family, other specialists and medical decision making of high complexity. I spent 45 minutes of neurocritical care time  in the care of  this patient.  Roland Rack, MD [facility] [phone]  If 7pm- 7am, please page neurology on call as listed in [place].

## 2017-08-03 NOTE — Progress Notes (Signed)
Rectal temp 93.3. Dr. Berline Lopes informed.

## 2017-08-03 NOTE — Progress Notes (Signed)
EEG tech paged to come place EEG back onto patient

## 2017-08-03 NOTE — Procedures (Signed)
Central Venous Catheter Insertion Procedure Note Phung Kotas [id number] 09-Jun-1956  Procedure: Insertion of Central Venous Catheter Indications: Assessment of intravascular volume, Drug and/or fluid administration and Frequent blood sampling  Procedure Details Consent: Unable to obtain consent because of emergent medical necessity. Time Out: Verified patient identification, verified procedure, site/side was marked, verified correct patient position, special equipment/implants available, medications/allergies/relevent history reviewed, required imaging and test results available.  Performed  Maximum sterile technique was used including antiseptics, cap, gloves, gown, hand hygiene, mask and sheet. Skin prep: Chlorhexidine; local anesthetic administered A antimicrobial bonded/coated triple lumen catheter was placed in the left internal jugular vein using the Seldinger technique.  Evaluation Blood flow good Complications: No apparent complications Patient did tolerate procedure well. Chest X-ray ordered to verify placement.  CXR: pending.  Procedure performed under direct ultrasound guidance for real time vessel cannulation.      Montey Hora, [place] [facility] Pager: [phone]  or [phone] 08/03/2017, 1:12 AM

## 2017-08-03 NOTE — Consult Note (Addendum)
[place] Nurse wound consult note Reason for Consult: Consult requested for right leg and sacrum/buttocks Wound type: Sacrum/buttocks with a partial thickness wound; appears to be related to moisture/shear; appearance is NOT consistent with a pressure injury.  5X2.5X.2cm, pink and moist, crescent-shaped, extends across sacrum/buttocks area. Pressure Injury POA: This is NOT a pressure injury but was present on admission. Measurement: Right leg with chronic full thickness wound; pt is familiar to [facility] team from previous admission.  Wound has improved in appearance;  Wound bed: 90% red and dry, 10% yellow, 4X.2X.2cm Drainage (amount, consistency, odor) No odor or drainage Periwound: Pink dry scar tissue surrounding the wound Dressing procedure/placement/frequency: Bactroban to promote moist healing.  Foam dressing to protect from further injury. No family present to discuss plan of care. Please re-consult if further assistance is needed.  Thank-you,  Julien Girt MSN, [place], [place], [place], [place]

## 2017-08-03 NOTE — Progress Notes (Signed)
PULMONARY / CRITICAL CARE MEDICINE   Name: [name] MRN: [id number] DOB: May 21, 1956    ADMISSION DATE:  08/02/2017  CHIEF COMPLAINT: Found down  HISTORY OF PRESENT ILLNESS:        This is a 62 year old diabetic with a history of alcohol and cocaine abuse who is previously been admitted to this hospital for a left thalamic hemorrhage.  She was found down the day of admission in a pool of her own stool and urine.  She was brought to our ED where she was observed to have overt clinical seizure activity.  She was treated with Keppra osphenytoin and Ativan with subsequent cessation of the clinical seizures.  EEG did not show ongoing seizure activity but shows decreased activity throughout the left hemisphere suggesting a postictal state.  She is unable to give any history and remains obtunded throughout the admission process.  Imaging studies did not show a new structural lesion.  She demonstrated a productive cough with purulent discharge while in the ED.  REVIEW OF SYSTEMS:   Not obtainable  SUBJECTIVE:  Not obtainable  VITAL SIGNS: BP 108/77   Pulse 69   Temp (!) 97.5 F (36.4 C) (Oral)   Resp 18   Ht 5\' 5"  (1.651 m)   Wt 198 lb 6.6 oz (90 kg)   SpO2 100%   BMI 33.02 kg/m   HEMODYNAMICS:    VENTILATOR SETTINGS: Vent Mode: PRVC FiO2 (%):  [40 %] 40 % Set Rate:  [18 bmp] 18 bmp Vt Set:  [500 mL] 500 mL PEEP:  [5 cmH20] 5 cmH20 Plateau Pressure:  [20 cmH20] 20 cmH20  INTAKE / OUTPUT: No intake/output data recorded.  PHYSICAL EXAMINATION: General: Somewhat obese female who is unresponsive and in no distress. Neuro: He is not responding to voice or sternal rub for me.  She is maintaining her airway adequately.  There was chronic leftward eye deviation.  I do not appreciate any seizure activity at present.  She does not spine to sternal rub.   Cardiovascular: S1 and S2 are rapid and regular with a loud S4 gallop.  There is no murmur.  No dependent edema. Lungs:  Respirations are unlabored, there are scattered rhonchi and no wheezes.  There is good air movement throughout. Abdomen: The abdomen is somewhat obese soft and nontender without any guarding or rebound.  Few bowel sounds are present. Musculoskeletal: There is a 4 x 1 cm ulceration on the medial  right ankle.  LABS:  BMET Recent Labs  Lab 08/02/17 1347 08/02/17 1745 08/02/17 2359 08/03/17 0303  NA 134*  --  146* 146*  K 5.4* 3.1* 2.8* 2.6*  CL 100*  --  112* 115*  CO2 18*  --  23 21*  BUN 13  --  13 13  CREATININE 1.51* 1.60* 1.60* 1.41*  GLUCOSE 616*  --  181* 110*   Electrolytes Recent Labs  Lab 08/02/17 1347 08/02/17 1715 08/02/17 2359 08/03/17 0303  CALCIUM 8.9  --  8.6* 8.2*  MG  --  1.8  --  1.4*  PHOS  --  7.0*  --  1.6*   CBC Recent Labs  Lab 08/02/17 1347 08/02/17 1956 08/03/17 0303  WBC 9.1 15.4* 13.9*  HGB 16.0* 15.0 12.3  HCT 47.7* 47.3* 38.2  PLT 276 270 269   Coag's No results for input(s): APTT, INR in the last 168 hours.  Sepsis Markers Recent Labs  Lab 08/02/17 1404 08/02/17 1748  LATICACIDVEN 2.23* 2.96*   ABG Recent Labs  Lab 08/02/17 1713 08/02/17 1936  PHART 7.056* 7.269*  PCO2ART 96.7* 50.9*  PO2ART 130* 81.0*   Liver Enzymes Recent Labs  Lab 08/02/17 1347  AST 57*  ALT 20  ALKPHOS 55  BILITOT 2.2*  ALBUMIN 2.8*   Cardiac Enzymes No results for input(s): TROPONINI, PROBNP in the last 168 hours.  Glucose Recent Labs  Lab 08/03/17 0126 08/03/17 0234 08/03/17 0345 08/03/17 0444 08/03/17 0532 08/03/17 0626  GLUCAP 157* 102* 116* 126* 134* 157*    Imaging Ct Head Wo Contrast  Result Date: 08/02/2017 CLINICAL DATA:  Altered level of consciousness EXAM: CT HEAD WITHOUT CONTRAST TECHNIQUE: Contiguous axial images were obtained from the base of the skull through the vertex without intravenous contrast. COMPARISON:  CT head 05/26/2017 FINDINGS: Brain: Generalized atrophy without hydrocephalus. Mild chronic  microvascular ischemic change in the white matter. Small chronic infarct in the right medial basal ganglia. Negative for acute hemorrhage. Negative for acute infarct or mass. Left thalamic hematoma on the prior study no longer visualized. Vascular: Negative for hyperdense vessel Skull: Negative Sinuses/Orbits: Negative Other: None IMPRESSION: Atrophy and chronic ischemia.  No acute abnormality. Electronically Signed   By: Franchot Gallo M.D.   On: 08/02/2017 15:40   Dg Chest Port 1 View  Result Date: 08/03/2017 CLINICAL DATA:  Central line placement EXAM: PORTABLE CHEST 1 VIEW COMPARISON:  08/02/2017, 05/25/2017 FINDINGS: Endotracheal tube tip is about 2.6 cm superior to the carina. New left-sided central venous catheter tip overlies the distal SVC. No pneumothorax is seen. Esophageal tube tip is below the diaphragm but not included. Continued subsegmental atelectasis at the left base and increasing hazy atelectasis or infiltrate at the right base. Stable cardiomegaly. IMPRESSION: 1. Left-sided central venous catheter tip overlies the SVC, no pneumothorax 2. Continued atelectasis or infiltrate at the right greater than left lung base 3. Cardiomegaly Electronically Signed   By: Donavan Foil M.D.   On: 08/03/2017 01:24   Dg Chest Port 1 View  Result Date: 08/02/2017 CLINICAL DATA:  Intubated EXAM: PORTABLE CHEST 1 VIEW COMPARISON:  Chest radiograph from earlier today. FINDINGS: Endotracheal tube tip is 2.8 cm above the carina. Enteric tube enters stomach with the tip not seen on this image. Stable cardiomediastinal silhouette with mild cardiomegaly. No pneumothorax. No pleural effusion. Stable hazy bibasilar lung opacities. No overt pulmonary edema. IMPRESSION: 1. Well-positioned support structures. 2. Stable mild cardiomegaly without pulmonary edema. 3. Stable hazy bibasilar lung opacities, which could represent atelectasis, aspiration and/or pneumonia. Electronically Signed   By: Ilona Sorrel M.D.   On:  08/02/2017 19:19   Dg Chest Portable 1 View  Result Date: 08/02/2017 CLINICAL DATA:  62 year old female with shortness of breath and cough. Altered mental status for several days. EXAM: PORTABLE CHEST 1 VIEW COMPARISON:  1411 hr today and earlier. FINDINGS: Portable AP supine view at 1812 hrs. Lower lung volumes with increasing bibasilar opacity, although perhaps due to atelectasis. Continued streaky left mid lung opacity. The right lung base opacity is less distinct. No pneumothorax or pulmonary edema. No definite effusion. Stable cardiac size and mediastinal contours. Paucity of bowel gas in the upper abdomen. IMPRESSION: 1. Continued streaky opacity in the left mid lung. This is nonspecific, but consider aspiration and/or pneumonia in this clinical setting. 2. Lower lung volumes with increasing bibasilar opacity elsewhere favored due to atelectasis. Electronically Signed   By: Genevie Ann M.D.   On: 08/02/2017 18:28   Dg Chest Port 1 View  Result Date: 08/02/2017 CLINICAL DATA:  This  study identified as missing a report at 6:24 pm on 08/02/2017. 62 year old female with altered mental status for several days. Cough. EXAM: PORTABLE CHEST 1 VIEW COMPARISON:  05/25/2017 chest radiographs. FINDINGS: Portable AP semi upright view at 1411 hrs. Streaky opacity in the left mid lung and at the right lung base is new since November. Similar mild streaky opacity or thickening along the right minor fissure. No pneumothorax or pulmonary edema. Stable lung volumes. No pleural effusion. Stable cardiac size and mediastinal contours. Dextroconvex thoracic scoliosis. Negative visible bowel gas pattern. IMPRESSION: 1. Acute streaky bilateral pulmonary opacity is nonspecific but suspicious for developing bilateral pneumonia. Consider aspiration in this clinical setting. 2. No pleural effusion. Electronically Signed   By: Genevie Ann M.D.   On: 08/02/2017 18:26   STUDIES:  EEG 1/24>>>This is an abnormal EEG.  There is evidence of  moderate generalized slowing of brain activity as well as severe focal slowing in the left temporal lobe.  The patient is not in non-convulsive status epilepticus.  ANTIBIOTICS: N/A  LINES/TUBES: CVC 1/24>>> ETT 1/24>>> NG/OG 1/24>>>  DISCUSSION: 65 yoF with a history of polysubstance abuse who presented after being found down to the ED whereupon she was observed to be in status epilepticus. She was loaded with anticonvulsants and intubated for airway protection. In addition she was found to be hyperglycemic with borderline anion gap. Patient remains heavily sedated to date for seizure suppression.   ASSESSMENT / PLAN:   PULMONARY A: CXR-CVC in position, persistent atelectasis vs infiltrate at the right greater than the left base. NO focal consolidation or pneumothorax  ABG on 1/24-7.269/50.9/81/23.5-no adjustments necessary  Patient intubated on PRVC as above P:   Maintain SpO2 sats >92% Weaning only as tolerated today   CARDIOVASCULAR A:  Echo pending for heart murmur and Hx of polysubstance abuse EKG on admission with sinus tach and prolonged QT P:  Continue telemetry Pressure support with levophed  RENAL A:   Hypomagnesemia  Hypophosphatemia  CK WNL's Hypokalemia-severe Cr 1.41 on 1/25 P:   Mag and Phos repleted  Potassium repletion in process with IV and PO per tube BMP Q4 hours KVO IVF  GASTROINTESTINAL A:   Gastric tube GI ppx P:   Initiate tube feeds once intubated for >24 hours  Protonix at 40mg  IV daily   HEMATOLOGIC A:   Hgb 12.3, no acute issues noted NO evidence of acute bleed P:  CBC in am  INFECTIOUS A:   She is empirically covered with Zosyn for aspiration, the ulceration on the medial side of the right ankle does not appear to be infected Leukocytosis at 13.9 on 1/25, slightly improved from admission  Patients admission vitals complicated with seizure activity, no clear evidence of sepsis P:   Consider antibiotics is patients status  worsens  ENDOCRINE A:   In DKA on arrival with marginal GAP. Insulin GGT initiated Patient became severely hypokalemic GAP closed times two Placed on D5W in lactated ringers solution   P:   Discontinue insulin ggt until hypokalemia improves Continue to monitor Q1 hour CBG and treat with sliding scale Will agressively replace potassium as above  NEUROLOGIC A:   Ammonia elevated on admission 56 Neurology following for seizure and acute encephalopathy  Neurology reviewed her imaging studies and do not feel that she has had another structural CNS event. Patient with status epileptics on presentation, most likely secondary to a metabolic disorder P:   Continuous  EEG monitoring Continue phenytoin as per neurology Propofol for sedation and  seizure suppression-at the time of evaluation she was continued on 160mcg/min Versed infusion for sedation and seizure suppression RASS goal: -1/-2  Kathi Ludwig, MD Resident on Pulmonary and [place] Pager: [phone]  Attending Note:  62 year old female with polysubstance abuse history presenting with a seizure and VDRF.  Patient undergoing burst suppression with propofol.  On exam, completely unresponsive on propofol with clear lungs.  I reviewed CXR myself, ETT is in good position.  Will KVO IVF.  Start TF per nutrition.  Hold off weaning.  Will discuss with neuro regarding duration of burst suppression.  PCCM will continue to follow.  The patient is critically ill with multiple organ systems failure and requires high complexity decision making for assessment and support, frequent evaluation and titration of therapies, application of advanced monitoring technologies and extensive interpretation of multiple databases.   Critical Care Time devoted to patient care services described in this note is  35  Minutes. This time reflects time of care of this signee Dr Jennet Maduro. This critical care time does  not reflect procedure time, or teaching time or supervisory time of PA/NP/Med student/Med Resident etc but could involve care discussion time.  Rush Farmer, M.D. [facility] Pulmonary/Critical Care Medicine. Pager: [phone]. After hours pager: [phone].

## 2017-08-03 NOTE — Progress Notes (Signed)
Initial Nutrition Assessment  DOCUMENTATION CODES:   Morbid obesity  INTERVENTION:    Vital High Protein at 10 ml/h (240 ml per day)  Pro-stat 60 ml TID  Provides 840 kcal (total intake 2783 kcal with propofol), 111 gm protein, 201 ml free water daily  NUTRITION DIAGNOSIS:   Inadequate oral intake related to inability to eat as evidenced by NPO status.  GOAL:   Provide needs based on ASPEN/SCCM guidelines  MONITOR:   Vent status, TF tolerance, Labs, I & O's  REASON FOR ASSESSMENT:   Ventilator, Consult Enteral/tube feeding initiation and management  ASSESSMENT:   62 yo female with PMH of DM, HTN, alcohol and cocaine abuse, L thalamic hemorrhage, insomnia disorder who was admitted on 1/24 after being found down; clinical seizures in the ED; required intubation in ED.  Discussed patient in ICU rounds and with RN today. Currently on insulin drip. Receiving continuous EEG. Received MD Consult for TF initiation and management. Propofol is being managed by Neurology for seizures; unable to change rate/dosage at this time.  Patient is currently intubated on ventilator support MV: 9.3 L/min Temp (24hrs), Avg:97.2 F (36.2 C), Min:94.5 F (34.7 C), Max:98.8 F (37.1 C)  Propofol: 73.6 ml/hr providing 1943 kcal from propofol. Labs reviewed. Sodium 146 (H), potassium 2.6 (L), phosphorus 1.6 (L), magnesium 1.4 (L) CBG's: [phone] Medications reviewed and include KCl, thiamine, Lantus, Novolog, propofol. 9% weight loss within 3 months is significant for the time frame. Unsure whether this was intentional or unintentional weight loss. No fat or muscle depletion present on nutrition focused physical exam.  NUTRITION - FOCUSED PHYSICAL EXAM:    Most Recent Value  Orbital Region  No depletion  Upper Arm Region  No depletion  Thoracic and Lumbar Region  Unable to assess  Buccal Region  No depletion  Temple Region  No depletion  Clavicle Bone Region  No depletion   Clavicle and Acromion Bone Region  No depletion  Scapular Bone Region  Unable to assess  Dorsal Hand  No depletion  Patellar Region  No depletion  Anterior Thigh Region  No depletion  Posterior Calf Region  No depletion  Edema (RD Assessment)  Mild  Hair  Reviewed  Eyes  Unable to assess  Mouth  Unable to assess  Skin  Reviewed  Nails  Reviewed       Diet Order:  Diet NPO time specified  EDUCATION NEEDS:   No education needs have been identified at this time  Skin:  Skin Assessment: Skin Integrity Issues: Skin Integrity Issues:: Stage II, Diabetic Ulcer Stage II: coccyx Diabetic Ulcer: R leg  Last BM:  PTA  Height:   Ht Readings from Last 1 Encounters:  08/02/17 5\' 5"  (1.651 m)    Weight:   Wt Readings from Last 1 Encounters:  08/03/17 198 lb 6.6 oz (90 kg)    Ideal Body Weight:  56.8 kg  BMI:  Body mass index is 33.02 kg/m.  Estimated Nutritional Needs:   Kcal:  [phone]  Protein:  114 gm  Fluid:  >1.5 L   Molli Barrows, RD, LDN, [place] Pager [phone] After Hours Pager [phone]

## 2017-08-03 NOTE — Progress Notes (Signed)
Pt has been transported to MRI at this time while on ventilator accompanied with two respiratory techs and Steffanie Dunn, Development worker, community. Pt can only have two medication drips while at MRI and Dr. Earnest Bailey states okay to turn off Versed for MRI.

## 2017-08-04 ENCOUNTER — Inpatient Hospital Stay (HOSPITAL_COMMUNITY): Payer: Medicaid Other

## 2017-08-04 DIAGNOSIS — L8990 Pressure ulcer of unspecified site, unspecified stage: Secondary | ICD-10-CM | POA: Diagnosis not present

## 2017-08-04 LAB — COMPREHENSIVE METABOLIC PANEL
ALBUMIN: 2 g/dL — AB (ref 3.5–5.0)
ALT: 9 U/L — ABNORMAL LOW (ref 14–54)
AST: 18 U/L (ref 15–41)
Alkaline Phosphatase: 37 U/L — ABNORMAL LOW (ref 38–126)
Anion gap: 12 (ref 5–15)
BUN: 15 mg/dL (ref 6–20)
CHLORIDE: 110 mmol/L (ref 101–111)
CO2: 17 mmol/L — AB (ref 22–32)
CREATININE: 2.04 mg/dL — AB (ref 0.44–1.00)
Calcium: 7.7 mg/dL — ABNORMAL LOW (ref 8.9–10.3)
GFR calc Af Amer: 29 mL/min — ABNORMAL LOW (ref >60)
GFR, EST NON AFRICAN AMERICAN: 25 mL/min — AB (ref >60)
GLUCOSE: 153 mg/dL — AB (ref 65–99)
POTASSIUM: 3.6 mmol/L (ref 3.5–5.1)
SODIUM: 139 mmol/L (ref 135–145)
Total Bilirubin: 0.5 mg/dL (ref 0.3–1.2)
Total Protein: 5.4 g/dL — ABNORMAL LOW (ref 6.5–8.1)

## 2017-08-04 LAB — GLUCOSE, CAPILLARY
GLUCOSE-CAPILLARY: 133 mg/dL — AB (ref 65–99)
GLUCOSE-CAPILLARY: 156 mg/dL — AB (ref 65–99)
Glucose-Capillary: 165 mg/dL — ABNORMAL HIGH (ref 65–99)
Glucose-Capillary: 179 mg/dL — ABNORMAL HIGH (ref 65–99)
Glucose-Capillary: 185 mg/dL — ABNORMAL HIGH (ref 65–99)

## 2017-08-04 LAB — CBC
HCT: 36.7 % (ref 36.0–46.0)
Hemoglobin: 12.2 g/dL (ref 12.0–15.0)
MCH: 31.4 pg (ref 26.0–34.0)
MCHC: 33.2 g/dL (ref 30.0–36.0)
MCV: 94.6 fL (ref 78.0–100.0)
PLATELETS: 222 10*3/uL (ref 150–400)
RBC: 3.88 MIL/uL (ref 3.87–5.11)
RDW: 14.7 % (ref 11.5–15.5)
WBC: 11.8 10*3/uL — AB (ref 4.0–10.5)

## 2017-08-04 LAB — MAGNESIUM: Magnesium: 2.2 mg/dL (ref 1.7–2.4)

## 2017-08-04 LAB — TRIGLYCERIDES: TRIGLYCERIDES: 233 mg/dL — AB (ref <150)

## 2017-08-04 LAB — PHENYTOIN LEVEL, TOTAL: Phenytoin Lvl: 8.4 ug/mL — ABNORMAL LOW (ref 10.0–20.0)

## 2017-08-04 LAB — PHOSPHORUS: PHOSPHORUS: 5.2 mg/dL — AB (ref 2.5–4.6)

## 2017-08-04 MED ORDER — SODIUM CHLORIDE 0.9 % IV SOLN
200.0000 mg | INTRAVENOUS | Status: AC
  Administered 2017-08-04: 200 mg via INTRAVENOUS
  Filled 2017-08-04: qty 20

## 2017-08-04 MED ORDER — SODIUM CHLORIDE 0.9 % IV SOLN
200.0000 mg | Freq: Two times a day (BID) | INTRAVENOUS | Status: DC
  Administered 2017-08-04 – 2017-08-21 (×34): 200 mg via INTRAVENOUS
  Filled 2017-08-04 (×42): qty 20

## 2017-08-04 MED ORDER — TOPIRAMATE 100 MG PO TABS
200.0000 mg | ORAL_TABLET | Freq: Two times a day (BID) | ORAL | Status: DC
  Administered 2017-08-04 – 2017-08-05 (×5): 200 mg via ORAL
  Filled 2017-08-04 (×6): qty 2

## 2017-08-04 MED ORDER — PENTOBARBITAL LOAD VIA INFUSION
10.0000 mg/kg | Freq: Once | INTRAVENOUS | Status: AC
  Administered 2017-08-04: 905 mg via INTRAVENOUS

## 2017-08-04 MED ORDER — SODIUM CHLORIDE 0.9 % IV SOLN
250.0000 mg | Freq: Once | INTRAVENOUS | Status: AC
  Administered 2017-08-04: 250 mg via INTRAVENOUS
  Filled 2017-08-04: qty 5

## 2017-08-04 MED ORDER — ACETAMINOPHEN 160 MG/5ML PO SOLN
325.0000 mg | Freq: Four times a day (QID) | ORAL | Status: DC | PRN
  Administered 2017-08-08 – 2017-08-27 (×7): 325 mg
  Filled 2017-08-04 (×7): qty 20.3

## 2017-08-04 MED ORDER — SODIUM CHLORIDE 0.9 % IV SOLN
250.0000 mL | INTRAVENOUS | Status: DC | PRN
  Administered 2017-08-04: 250 mL via INTRAVENOUS

## 2017-08-04 MED ORDER — MIDAZOLAM BOLUS VIA INFUSION
10.0000 mg | Freq: Once | INTRAVENOUS | Status: AC
  Administered 2017-08-04: 10 mg via INTRAVENOUS

## 2017-08-04 MED ORDER — FUROSEMIDE 10 MG/ML IJ SOLN
40.0000 mg | Freq: Once | INTRAMUSCULAR | Status: AC
Start: 2017-08-04 — End: 2017-08-04
  Administered 2017-08-04: 40 mg via INTRAVENOUS
  Filled 2017-08-04: qty 4

## 2017-08-04 MED ORDER — PENTOBARBITAL SODIUM 50 MG/ML IJ SOLN
0.2500 mg/kg/h | INTRAVENOUS | Status: DC
  Administered 2017-08-04: 1 mg/kg/h via INTRAVENOUS
  Administered 2017-08-05 (×2): 3 mg/kg/h via INTRAVENOUS
  Filled 2017-08-04 (×4): qty 50

## 2017-08-04 NOTE — Progress Notes (Signed)
Despite very high doses of propofol and additional versed, she continues to have breakthrough seizures. At this point, I think that continued suppression is necessary and would be concerned about prolonged use of propofol.    At this time, I would favor using a barbiturate  (e.g. Pentobarbital) instead of continuing this dose of propofol to see if we can achieve seizure control.   Roland Rack, MD [facility] [phone]  If 7pm- 7am, please page neurology on call as listed in [place].

## 2017-08-04 NOTE — Progress Notes (Signed)
Neurology Progress Note   S:// Patient seen and examined. Continues to have frequent seizures on lower doses of sedation. Fourth agent-Topamax added overnight. Continues to be on propofol 140 an hour and Versed 20 an hour.  O:// Current vital signs: BP (!) 89/63   Pulse 77   Temp 98.8 F (37.1 C)   Resp 18   Ht _0  (1.651 m)   Wt 90.5 kg (199 lb 8.3 oz)   SpO2 98%   BMI 33.20 kg/m  Vital signs in last 24 hours: Temp:  [93.3 F (34.1 C)-101.5 F (38.6 C)] 98.8 F (37.1 C) (01/26 0645) Pulse Rate:  [55-97] 77 (01/26 0807) Resp:  [18-21] 18 (01/26 0807) BP: (89-138)/(60-88) 89/63 (01/26 0807) SpO2:  [97 %-100 %] 98 % (01/26 0807) FiO2 (%):  [40 %] 40 % (01/26 0807) Weight:  [90.5 kg (199 lb 8.3 oz)] 90.5 kg (199 lb 8.3 oz) (01/26 0500) Neurological exam remains unchanged Limited neurological exam due to sedation on propofol and Versed Patient sedated intubated No purposeful movements Pupils are equal round reactive to light without any gaze preference Corneals mildly reactive Oculocephalics negative No withdrawal on noxious stimulus in either extremity.    Medications  Current Facility-Administered Medications:  .  0.9 %  sodium chloride infusion, 250 mL, Intravenous, PRN, Sampson Goon, MD .  acetaminophen (TYLENOL) solution 325 mg, 325 mg, Per Tube, Q6H PRN, Omar Person, NP .  chlorhexidine gluconate (MEDLINE KIT) (PERIDEX) 0.12 % solution 15 mL, 15 mL, Mouth Rinse, BID, Sampson Goon, MD, 15 mL at 08/04/17 0736 .  Chlorhexidine Gluconate Cloth 2 % PADS 6 each, 6 each, Topical, Daily, Sampson Goon, MD, 6 each at 08/03/17 1108 .  feeding supplement (PRO-STAT SUGAR FREE 64) liquid 60 mL, 60 mL, Per Tube, TID, Rush Farmer, MD, 60 mL at 08/03/17 2146 .  feeding supplement (VITAL HIGH PROTEIN) liquid 1,000 mL, 1,000 mL, Per Tube, Q24H, Rush Farmer, MD, 1,000 mL at 08/03/17 1346 .  heparin injection 5,000 Units, 5,000 Units, Subcutaneous, Q8H, Sampson Goon, MD, 5,000 Units at 08/04/17 0509 .  insulin aspart (novoLOG) injection 0-9 Units, 0-9 Units, Subcutaneous, Q4H, Jule Ser, DO, 2 Units at 08/04/17 0847 .  insulin glargine (LANTUS) injection 8 Units, 8 Units, Subcutaneous, Daily, Jule Ser, DO, 8 Units at 08/03/17 0510 .  lacosamide (VIMPAT) 200 mg in sodium chloride 0.9 % 25 mL IVPB, 200 mg, Intravenous, Q12H, Kirkpatrick, Vida Roller, MD .  levETIRAcetam (KEPPRA) 500 mg in sodium chloride 0.9 % 100 mL IVPB, 500 mg, Intravenous, Q12H, Marliss Coots, PA-C, Stopped at 08/04/17 [phone] .  MEDLINE mouth rinse, 15 mL, Mouth Rinse, QID, Sampson Goon, MD, 15 mL at 08/04/17 0403 .  midazolam (VERSED) 100 mg in sodium chloride 0.9 % 100 mL (1 mg/mL) infusion, 20 mg/hr, Intravenous, Continuous, Greta Doom, MD, Last Rate: 20 mL/hr at 08/04/17 0819, 20 mg/hr at 08/04/17 0819 .  mupirocin cream (BACTROBAN) 2 %, , Topical, Daily, Sampson Goon, MD .  norepinephrine (LEVOPHED) 4 mg in dextrose 5 % 250 mL (0.016 mg/mL) infusion, 0-40 mcg/min, Intravenous, Titrated, Mauri Brooklyn, MD, Stopped at 08/04/17 0022 .  pantoprazole (PROTONIX) injection 40 mg, 40 mg, Intravenous, QHS, Sampson Goon, MD, 40 mg at 08/03/17 2145 .  phenylephrine (NEO-SYNEPHRINE) 10 mg in sodium chloride 0.9 % 250 mL (0.04 mg/mL) infusion, 0-400 mcg/min, Intravenous, Titrated, Winfrey, William B, MD .  phenytoin (DILANTIN) injection 100 mg, 100 mg, Intravenous,  Q8H, Amie Portland, MD, 100 mg at 08/04/17 0509 .  potassium chloride 20 MEQ/15ML (10%) solution 40 mEq, 40 mEq, Per Tube, Daily, Kathi Ludwig, MD, 40 mEq at 08/03/17 0740 .  propofol (DIPRIVAN) 1000 MG/100ML infusion, 140 mcg/kg/min, Intravenous, Titrated, Greta Doom, MD, Last Rate: 79.2 mL/hr at 08/04/17 0743, 140 mcg/kg/min at 08/04/17 0743 .  sodium chloride flush (NS) 0.9 % injection 10-40 mL, 10-40 mL, Intracatheter, Q12H, Sampson Goon, MD, 10 mL at 08/03/17 2146 .  sodium  chloride flush (NS) 0.9 % injection 10-40 mL, 10-40 mL, Intracatheter, PRN, Sampson Goon, MD .  thiamine (B-1) injection 100 mg, 100 mg, Intravenous, Daily, Sampson Goon, MD, 100 mg at 08/03/17 0913 .  topiramate (TOPAMAX) tablet 200 mg, 200 mg, Oral, BID, Greta Doom, MD, 200 mg at 08/04/17 0235 Labs CBC    Component Value Date/Time   WBC 11.8 (H) 08/04/2017 0454   RBC 3.88 08/04/2017 0454   HGB 12.2 08/04/2017 0454   HCT 36.7 08/04/2017 0454   PLT 222 08/04/2017 0454   MCV 94.6 08/04/2017 0454   MCH 31.4 08/04/2017 0454   MCHC 33.2 08/04/2017 0454   RDW 14.7 08/04/2017 0454   LYMPHSABS 2.8 08/03/2017 0303   MONOABS 1.1 (H) 08/03/2017 0303   EOSABS 0.1 08/03/2017 0303   BASOSABS 0.0 08/03/2017 0303    CMP     Component Value Date/Time   NA 139 08/04/2017 0454   K 3.6 08/04/2017 0454   CL 110 08/04/2017 0454   CO2 17 (L) 08/04/2017 0454   GLUCOSE 153 (H) 08/04/2017 0454   BUN 15 08/04/2017 0454   CREATININE 2.04 (H) 08/04/2017 0454   CREATININE 1.28 (H) 03/27/2017 1615   CALCIUM 7.7 (L) 08/04/2017 0454   PROT 5.4 (L) 08/04/2017 0454   ALBUMIN 2.0 (L) 08/04/2017 0454   AST 18 08/04/2017 0454   ALT 9 (L) 08/04/2017 0454   ALKPHOS 37 (L) 08/04/2017 0454   BILITOT 0.5 08/04/2017 0454   GFRNONAA 25 (L) 08/04/2017 0454   GFRNONAA 45 (L) 03/27/2017 1615   GFRAA 29 (L) 08/04/2017 0454   GFRAA 53 (L) 03/27/2017 1615   Lipid Panel     Component Value Date/Time   CHOL 90 05/28/2017 0501   TRIG 233 (H) 08/04/2017 0454   HDL 36 (L) 05/28/2017 0501   CHOLHDL 2.5 05/28/2017 0501   VLDL 15 05/28/2017 0501   LDLCALC 39 05/28/2017 0501   Imaging I have reviewed images in epic and the results pertinent to this consultation are: CT-scan of the brain no acute changes MRI examination of the brain-no acute changes.  Remote bilateral thalamic and brainstem infarcts.  Remote left thalamic and left pontine hemorrhage.  Assessment:  63-year-old woman with history of  hyperglycemia, cocaine abuse, noncompliant with Keppra seen for concern for status epilepticus initially. Initial spot EEG was negative for seizures but the long-term EEG monitoring showed multiple electrographic seizures.  She was started on Keppra, Dilantin and Vimpat along with Versed and propofol drips at high doses.  She continues to have frequent electrographic seizure activity on low doses of propofol and Versed requiring doses to be higher. A fourth antiepileptic agent-Topamax was added overnight along with propofol at 140 an hour and Versed at 20 an hour. There is no structural lesion identified that could explain these changes on the MRI of the brain. Because of the given extremely active electrographic EEG, I would continue aggressively treating her with the current medications for today, and if  the EEG does not show any improvement, I will consider using pentobarbital tomorrow for inducing pentobarbital, with the hope that that would resolve the cortical irritation that is originating from the left side of her brain.  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  Recommendations: Continue with continuous propofol and Versed infusions at current doses. Continue with Keppra, Vimpat, Dilantin and recently added Topamax. Maintain seizure precautions Consider pentobarbital if EEG does not stabilize in 24 hours. We will continue to follow with you.   -- Amie Portland, MD [facility] Pager: [phone] If 7pm to 7am, please call on call as listed on AMION.  CRITICAL CARE ATTESTATION This patient is critically ill and at significant risk of neurological worsening, death and care requires constant monitoring of vital signs, hemodynamics,respiratory and cardiac monitoring. I spent 30  minutes of neurocritical care time performing neurological assessment, discussion with family, other  specialists and medical decision making of high complexityin the care of  this patient.

## 2017-08-04 NOTE — Progress Notes (Signed)
LTM continues, no skin breakdown noted.

## 2017-08-04 NOTE — Progress Notes (Signed)
Pt received 10 mg Versed bolus over 2 minutes every 5 minutes x 2 per verbal order by MD Leonel Ramsay. Pt received a total of 20 mg of Versed over 9 minutes.

## 2017-08-04 NOTE — Progress Notes (Signed)
PULMONARY / CRITICAL CARE MEDICINE   Name: [name] MRN: [id number] DOB: [birth year]    ADMISSION DATE:  08/02/2017  CHIEF COMPLAINT: Found down  HISTORY OF PRESENT ILLNESS:        This is a 62 year old diabetic with a history of alcohol and cocaine abuse who is previously been admitted to this hospital for a left thalamic hemorrhage.  She was found down the day of admission in a pool of her own stool and urine.  She was brought to our ED where she was observed to have overt clinical seizure activity.  She was treated with Keppra osphenytoin and Ativan with subsequent cessation of the clinical seizures.  EEG did not show ongoing seizure activity but shows decreased activity throughout the left hemisphere suggesting a postictal state.  She is unable to give any history and remains obtunded throughout the admission process.  Imaging studies did not show a new structural lesion.  She demonstrated a productive cough with purulent discharge while in the ED.     SUBJECTIVE:  Increased propofol and versed gtts overnight per neuro for ongoing seizure activity.   VITAL SIGNS: BP 107/71   Pulse 78   Temp (!) 97.3 F (36.3 C)   Resp 18   Ht 5\' 5"  (1.651 m)   Wt 90.5 kg (199 lb 8.3 oz)   SpO2 98%   BMI 33.20 kg/m   HEMODYNAMICS:    VENTILATOR SETTINGS: Vent Mode: PRVC FiO2 (%):  [40 %] 40 % Set Rate:  [18 bmp] 18 bmp Vt Set:  [500 mL] 500 mL PEEP:  [5 cmH20] 5 cmH20 Plateau Pressure:  [22 cmH20] 22 cmH20  INTAKE / OUTPUT: I/O last 3 completed shifts: In: 5676.7 [I.V.:4339.8; NG/GT:162.3; IV Piggyback:1174.6] Out: 1060 [PJASN:0539]  PHYSICAL EXAMINATION: General:  Chronically ill appearing female, heavily sedated  HEENT: MM pink/moist Neuro: RASS -4, burst suppression with propofol, versed gtts CV: s1s2 rrr, no m/r/g PULM: even/non-labored, few rhonchi otherwise clear  JQ:BHAL, non-tender, bsx4 active  Extremities: warm/dry, scant BLE  edema  Skin: no rashes or  lesions   LABS:  BMET Recent Labs  Lab 08/03/17 1100 08/03/17 1537 08/04/17 0454  NA 140 141 139  K 3.5 3.0* 3.6  CL 108 112* 110  CO2 20* 17* 17*  BUN 13 14 15   CREATININE 1.73* 1.76* 2.04*  GLUCOSE 251* 142* 153*   Electrolytes Recent Labs  Lab 08/03/17 0303 08/03/17 1100 08/03/17 1537 08/04/17 0454  CALCIUM 8.2* 8.0* 8.1* 7.7*  MG 1.4*  --  2.5* 2.2  PHOS 1.6*  --  3.2 5.2*   CBC Recent Labs  Lab 08/02/17 1956 08/03/17 0303 08/04/17 0454  WBC 15.4* 13.9* 11.8*  HGB 15.0 12.3 12.2  HCT 47.3* 38.2 36.7  PLT 270 269 222   Coag's No results for input(s): APTT, INR in the last 168 hours.  Sepsis Markers Recent Labs  Lab 08/02/17 1404 08/02/17 1748  LATICACIDVEN 2.23* 2.96*   ABG Recent Labs  Lab 08/02/17 1713 08/02/17 1936  PHART 7.056* 7.269*  PCO2ART 96.7* 50.9*  PO2ART 130* 81.0*   Liver Enzymes Recent Labs  Lab 08/02/17 1347 08/04/17 0454  AST 57* 18  ALT 20 9*  ALKPHOS 55 37*  BILITOT 2.2* 0.5  ALBUMIN 2.8* 2.0*   Cardiac Enzymes No results for input(s): TROPONINI, PROBNP in the last 168 hours.  Glucose Recent Labs  Lab 08/03/17 1126 08/03/17 1524 08/03/17 1923 08/03/17 2357 08/04/17 0343 08/04/17 0747  GLUCAP 256* 145* 131* 126* 133* 165*  Imaging Mr Brain Wo Contrast  Result Date: 08/03/2017 CLINICAL DATA:  Altered level of consciousness. Personal history of alcohol and cocaine abuse. Previous thalamic hemorrhage. EXAM: MRI HEAD WITHOUT CONTRAST TECHNIQUE: Multiplanar, multiecho pulse sequences of the brain and surrounding structures were obtained without intravenous contrast. COMPARISON:  CT head without contrast 08/02/2016. MRI brain 11/02/2016. FINDINGS: Brain: The diffusion-weighted images demonstrate no acute or subacute infarction. No acute hemorrhage or mass lesion is present. Ventricles are of normal size. No significant extra-axial fluid collection is present. Remote infarcts are present in the pons bilaterally.  Remote infarcts are present in the thalami bilaterally. A cavum septum pellucidum is again noted. Mild generalized atrophy is somewhat advanced for age. No significant extra-axial fluid collection is present. Dedicated imaging of the temporal lobes demonstrate symmetric size and signal of the hippocampal structures. No temporal lobe mass lesion is present. Vascular: Flow is present in the major intracranial arteries. Skull and upper cervical spine: The skull base is within normal limits. The craniocervical junction is normal. Sinuses/Orbits: Fluid and mucosal thickening is present throughout the ethmoid air cells. There is mild mucosal thickening of the maxillary sinuses. Fluid is noted within the nasopharynx. This is likely secondary to intubation. Globes and orbits are within normal limits. IMPRESSION: 1. No acute intracranial abnormality. 2. 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 3. Remote hemorrhage in the left thalamus and left pons. 4. Age advanced atrophy. 5. Fluid in the nasopharynx and paranasal sinuses is likely secondary to intubation. Electronically Signed   By: San Morelle M.D.   On: 08/03/2017 13:17   Dg Chest Port 1 View  Result Date: 08/04/2017 CLINICAL DATA:  Patient ventilated. EXAM: PORTABLE CHEST 1 VIEW COMPARISON:  August 03, 2017 FINDINGS: Stable cardiomegaly and cardiomediastinal silhouette. The ET tube and left central line are in good position. The NG tube terminates below today's film. No pneumothorax. A right-sided pleural effusion is larger in the interval with underlying opacity. There is probably a small layering effusion on the left, not seen previously. The opacity in the left base is also increased. No other interval changes. IMPRESSION: 1. Support apparatus as above. 2. Bilateral pleural effusions, right greater than left, worsened in the interval. Opacities underlying the effusions are more prominent as well. Electronically Signed   By: Dorise Bullion III M.D   On: 08/04/2017 07:38   STUDIES:  EEG 1/24>>>This is an abnormal EEG.  There is evidence of moderate generalized slowing of brain activity as well as severe focal slowing in the left temporal lobe.  The patient is not in non-convulsive status epilepticus. 2D echo 1/25>>> EF 56-43%, grade 2 diastolic dysfunction  MRI brain 1/25>>> 1. No acute intracranial abnormality. 2. 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 3. Remote hemorrhage in the left thalamus and left pons. 4. Age advanced atrophy. 5. Fluid in the nasopharynx and paranasal sinuses is likely secondary to intubation.  ANTIBIOTICS: N/A  LINES/TUBES: CVC 1/24>>> ETT 1/24>>> NG/OG 1/24>>>  DISCUSSION: 19 yoF with a history of polysubstance abuse who presented after being found down to the ED whereupon she was observed to be in status epilepticus. She was loaded with anticonvulsants and intubated for airway protection. In addition she was found to be hyperglycemic with borderline anion gap. Patient remains heavily sedated to date for seizure suppression.   ASSESSMENT / PLAN:   PULMONARY A: Acute respiratory failure r/t seizure/ burst suppression  P:   Vent support - 8cc/kg  F/u CXR  F/u ABG No  weaning   CARDIOVASCULAR A:  HTN  dCHF   Cocaine abuse  P:  Continue telemetry Echo as above  Pressors as needed for BP support with high dose propofol   RENAL A:   AKI  Hypomagnesemia  Hypophosphatemia P:   Mag and Phos repleted  F/u chem  Gentle volume  KVO IVF   GASTROINTESTINAL A:   No active issue  P:   PPI  TF  HEMATOLOGIC A:   No active issue  P:  CBC in am  INFECTIOUS A:   No active issue  Initially some concern for aspiration  P:   Monitor off abx for now  VAP prevention   ENDOCRINE A:   DKA - resolved.  DM  P:   SSI  lantus    NEUROLOGIC A:   AMS  Seizure  Status epilepticus  P:   Continuous  EEG monitoring Continue AEDs as per  neurology Propofol, versed for burst suppression per neuro  May need phenobarbital   RASS goal: -4   No family at bedside 1/26   Nickolas Madrid, NP 08/04/2017  11:30 AM Pager: [phone] or [phone]

## 2017-08-04 NOTE — Procedures (Signed)
  Electroencephalogram report- LTM     Data acquisition: 10-20 electrode placement.  Additional T1, T2, and EKG electrodes; 26 channel digital referential acquisition reformatted to 18 channel/7 channel coronal bipolar     Spike detection: ON     Seizure detection: ON   Beginning time: 08/03/2017 at 08 05 am  Ending time: 08/04/2017 at 07 22  AM  CPT: 45364 Day of study: Day 2    This  intensive EEG monitoring with simultaneous video monitoring was performed for this patient with seizures to rule out subclinical electrographic seizures. Patient is intubated and sedated. Medications: As per EMR  Day 1 : Background activity is marked by background activity slowing was more prominent polymorphic delta slowing across the left frontotemporal region.  Superimposed near continuous left anterior temporal sharp wave spikes and left periodic lateralized epileptiform discharges in that region present throughout the recording.  In addition there is brief electrographic seizures also noted arising from the left frontotemporal cortex.  Gradually patient develops a burst suppression pattern however left anterior temporal spikes persist.  During the last couple hours of recording background activity is marked by significant attenuation of background activities occasionally punctuated with left anterior temporal spikes.  Day 2: Background activity is marked by suppressed and non reactive background activities.  Superimposed  left anterior temporal  Spikes are present.  During second half of the recording  there are  electrographic seizures reoccured  arising from the left frontotemporal cortex. Frequency of  Seizures : 1 -4 per hour  Last seizures at  6 24 am .   Clinical interpretation: This day 2 of  intensive EEG monitoring with simultaneous video monitoring c/w severe encephalopathy and cortical irritability  in the left anterior temporal/frontal cortex. Electrographic seizures are still preset with last  seizure at 06 24 am .

## 2017-08-05 ENCOUNTER — Inpatient Hospital Stay (HOSPITAL_COMMUNITY): Payer: Medicaid Other

## 2017-08-05 DIAGNOSIS — J9601 Acute respiratory failure with hypoxia: Secondary | ICD-10-CM | POA: Diagnosis present

## 2017-08-05 LAB — GLUCOSE, CAPILLARY
GLUCOSE-CAPILLARY: 178 mg/dL — AB (ref 65–99)
GLUCOSE-CAPILLARY: 186 mg/dL — AB (ref 65–99)
GLUCOSE-CAPILLARY: 189 mg/dL — AB (ref 65–99)
GLUCOSE-CAPILLARY: 195 mg/dL — AB (ref 65–99)
Glucose-Capillary: 172 mg/dL — ABNORMAL HIGH (ref 65–99)
Glucose-Capillary: 196 mg/dL — ABNORMAL HIGH (ref 65–99)
Glucose-Capillary: 202 mg/dL — ABNORMAL HIGH (ref 65–99)
Glucose-Capillary: 202 mg/dL — ABNORMAL HIGH (ref 65–99)
Glucose-Capillary: 210 mg/dL — ABNORMAL HIGH (ref 65–99)

## 2017-08-05 LAB — CBC
HEMATOCRIT: 39.9 % (ref 36.0–46.0)
Hemoglobin: 12.6 g/dL (ref 12.0–15.0)
MCH: 31.1 pg (ref 26.0–34.0)
MCHC: 31.6 g/dL (ref 30.0–36.0)
MCV: 98.5 fL (ref 78.0–100.0)
Platelets: 207 10*3/uL (ref 150–400)
RBC: 4.05 MIL/uL (ref 3.87–5.11)
RDW: 14.9 % (ref 11.5–15.5)
WBC: 18.3 10*3/uL — ABNORMAL HIGH (ref 4.0–10.5)

## 2017-08-05 LAB — CREATININE, URINE, RANDOM: Creatinine, Urine: 107.9 mg/dL

## 2017-08-05 LAB — LACTIC ACID, PLASMA: Lactic Acid, Venous: 1.8 mmol/L (ref 0.5–1.9)

## 2017-08-05 LAB — URINALYSIS, ROUTINE W REFLEX MICROSCOPIC
BILIRUBIN URINE: NEGATIVE
Bacteria, UA: NONE SEEN
GLUCOSE, UA: NEGATIVE mg/dL
Hgb urine dipstick: NEGATIVE
KETONES UR: NEGATIVE mg/dL
Nitrite: NEGATIVE
PROTEIN: 30 mg/dL — AB
RBC / HPF: NONE SEEN RBC/hpf (ref 0–5)
Specific Gravity, Urine: 1.017 (ref 1.005–1.030)
pH: 5 (ref 5.0–8.0)

## 2017-08-05 LAB — BASIC METABOLIC PANEL
Anion gap: 12 (ref 5–15)
BUN: 37 mg/dL — ABNORMAL HIGH (ref 6–20)
CALCIUM: 7.7 mg/dL — AB (ref 8.9–10.3)
CO2: 15 mmol/L — AB (ref 22–32)
Chloride: 112 mmol/L — ABNORMAL HIGH (ref 101–111)
Creatinine, Ser: 2.13 mg/dL — ABNORMAL HIGH (ref 0.44–1.00)
GFR calc non Af Amer: 24 mL/min — ABNORMAL LOW (ref >60)
GFR, EST AFRICAN AMERICAN: 28 mL/min — AB (ref >60)
Glucose, Bld: 237 mg/dL — ABNORMAL HIGH (ref 65–99)
Potassium: 4.6 mmol/L (ref 3.5–5.1)
Sodium: 139 mmol/L (ref 135–145)

## 2017-08-05 LAB — POCT I-STAT 3, ART BLOOD GAS (G3+)
ACID-BASE DEFICIT: 11 mmol/L — AB (ref 0.0–2.0)
Bicarbonate: 15.5 mmol/L — ABNORMAL LOW (ref 20.0–28.0)
O2 Saturation: 96 %
PH ART: 7.26 — AB (ref 7.350–7.450)
TCO2: 17 mmol/L — ABNORMAL LOW (ref 22–32)
pCO2 arterial: 34.1 mmHg (ref 32.0–48.0)
pO2, Arterial: 86 mmHg (ref 83.0–108.0)

## 2017-08-05 LAB — OSMOLALITY, URINE: OSMOLALITY UR: 364 mosm/kg (ref 300–900)

## 2017-08-05 LAB — NA AND K (SODIUM & POTASSIUM), RAND UR
POTASSIUM UR: 62 mmol/L
Sodium, Ur: <10 mmol/L

## 2017-08-05 LAB — TRIGLYCERIDES: TRIGLYCERIDES: 49 mg/dL (ref <150)

## 2017-08-05 LAB — BETA-HYDROXYBUTYRIC ACID: Beta-Hydroxybutyric Acid: 0.09 mmol/L (ref 0.05–0.27)

## 2017-08-05 MED ORDER — VITAL HIGH PROTEIN PO LIQD
1000.0000 mL | ORAL | Status: DC
  Administered 2017-08-05 – 2017-08-29 (×23): 1000 mL
  Administered 2017-08-29: 10:00:00
  Administered 2017-08-30 – 2017-09-04 (×7): 1000 mL
  Filled 2017-08-05 (×17): qty 1000

## 2017-08-05 NOTE — Progress Notes (Signed)
PULMONARY / CRITICAL CARE MEDICINE   Name: [name] MRN: [id number] DOB: 05/31/1956    ADMISSION DATE:  08/02/2017  CHIEF COMPLAINT: Found down  HISTORY OF PRESENT ILLNESS:        This is a 62 year old diabetic with a history of alcohol and cocaine abuse who is previously been admitted to this hospital for a left thalamic hemorrhage.  She was found down the day of admission in a pool of her own stool and urine.  She was brought to our ED where she was observed to have overt clinical seizure activity.  She was treated with Keppra fosphenytoin and Ativan with subsequent cessation of the clinical seizures.  EEG did not show ongoing seizure activity but shows decreased activity throughout the left hemisphere suggesting a postictal state.  She is unable to give any history and remains obtunded throughout the admission process.  Imaging studies did not show a new structural lesion.  She demonstrated a productive cough with purulent discharge while in the ED.   SUBJECTIVE:  Propofol and versed discontinued overnight with addition of pentobarbital due to ongoing seizure activity as noted on continuous EEG.   VITAL SIGNS: BP 118/73   Pulse 83   Temp (!) 97 F (36.1 C)   Resp 18   Ht 5\' 5"  (1.651 m)   Wt 195 lb 1.7 oz (88.5 kg)   SpO2 99%   BMI 32.47 kg/m   HEMODYNAMICS:    VENTILATOR SETTINGS: Vent Mode: PRVC FiO2 (%):  [40 %] 40 % Set Rate:  [18 bmp] 18 bmp Vt Set:  [500 mL] 500 mL PEEP:  [5 cmH20] 5 cmH20 Plateau Pressure:  [21 FYB01-75 cmH20] 21 cmH20  INTAKE / OUTPUT: I/O last 3 completed shifts: In: 4442.9 [I.V.:3740.6; NG/GT:272.3; IV Piggyback:430] Out: 1000 [Urine:1000]  PHYSICAL EXAMINATION: General:  Chronically ill appearing female, heavily sedated Neuro: RASS -4, no response to commands HEENT: MM pink/moist, Pupils equal unreactive to light CV: s1s2 RRR, no m/r/g auscultated PULM: Mild rhonchi bilaterally, no wheezing noted. Breath sounds adequate  GI: soft,  non-tender, bowel sounds active  Extremities: warm/dry, no pedal edema  Skin: no rashes or lesions  LABS:  BMET Recent Labs  Lab 08/03/17 1537 08/04/17 0454 08/05/17 0420  NA 141 139 139  K 3.0* 3.6 4.6  CL 112* 110 112*  CO2 17* 17* 15*  BUN 14 15 37*  CREATININE 1.76* 2.04* 2.13*  GLUCOSE 142* 153* 237*   Electrolytes Recent Labs  Lab 08/03/17 0303  08/03/17 1537 08/04/17 0454 08/05/17 0420  CALCIUM 8.2*   < > 8.1* 7.7* 7.7*  MG 1.4*  --  2.5* 2.2  --   PHOS 1.6*  --  3.2 5.2*  --    < > = values in this interval not displayed.   CBC Recent Labs  Lab 08/03/17 0303 08/04/17 0454 08/05/17 0420  WBC 13.9* 11.8* 18.3*  HGB 12.3 12.2 12.6  HCT 38.2 36.7 39.9  PLT 269 222 207   Coag's No results for input(s): APTT, INR in the last 168 hours.  Sepsis Markers Recent Labs  Lab 08/02/17 1404 08/02/17 1748  LATICACIDVEN 2.23* 2.96*   ABG Recent Labs  Lab 08/02/17 1713 08/02/17 1936  PHART 7.056* 7.269*  PCO2ART 96.7* 50.9*  PO2ART 130* 81.0*   Liver Enzymes Recent Labs  Lab 08/02/17 1347 08/04/17 0454  AST 57* 18  ALT 20 9*  ALKPHOS 55 37*  BILITOT 2.2* 0.5  ALBUMIN 2.8* 2.0*   Cardiac Enzymes No results  for input(s): TROPONINI, PROBNP in the last 168 hours.  Glucose Recent Labs  Lab 08/04/17 1623 08/04/17 1944 08/05/17 0006 08/05/17 0007 08/05/17 0027 08/05/17 0313  GLUCAP 156* 185* 189* 178* 186* 202*   Imaging No results found. STUDIES:  EEG 1/24>>>This is an abnormal EEG.  There is evidence of moderate generalized slowing of brain activity as well as severe focal slowing in the left temporal lobe.  The patient is not in non-convulsive status epilepticus. 2D echo 1/25>>> EF 95-09%, grade 2 diastolic dysfunction  MRI brain 1/25>>> 1. No acute intracranial abnormality. 2. 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 3. Remote hemorrhage in the left thalamus and left pons. 4. Age advanced atrophy. 5. Fluid in the  nasopharynx and paranasal sinuses is likely secondary to intubation.  ANTIBIOTICS: N/A  LINES/TUBES: CVC 1/24>>> ETT 1/24>>> NG/OG 1/24>>>  DISCUSSION: 86 yoF with a history of polysubstance abuse who presented after being found down to the ED whereupon she was observed to be in status epilepticus. She was loaded with anticonvulsants and intubated for airway protection. In addition she was found to be hyperglycemic with borderline anion gap. Patient remains heavily sedated to date for seizure suppression.   ASSESSMENT / PLAN:   PULMONARY A: Acute respiratory failure r/t seizure/ burst suppression  P:   Vent support - 8cc/kg  F/u CXR-slightly improved RRL infiltrate with improved aeration  Repeat CXR in am F/u ABG  No weaning until burst suppression accomplished x 48 hours  CARDIOVASCULAR A:  HTN  HFpEF: Echo-G2DD Cocaine abuse Hx w/ positive urine screen this admission P:  Continue telemetry Echo as above  Pressors as needed for BP support with high dose propofol  EKG-grossly prolonged QTc  RENAL A:   AKI  Hypomagnesemia-resolved Hypophosphatemia-resolved P:   Mag and Phos repleted  F/u chem  Gentle volume for AKI  GASTROINTESTINAL A:   No active issue  P:   PPI  Tube feeds   HEMATOLOGIC A:   No active issue  P:  CBC in am Heparin for DVT ppx  INFECTIOUS A:   No active issue  Initially some concern for aspiration  P:   Monitor off abx for now  VAP prevention   ENDOCRINE A:   DKA - resolved.  Non-insulin dependent-DM  P:   SSI and lantus   NEUROLOGIC A:   AMS  Seizure  Status epilepticus  P:   Continuous  EEG monitoring Continue AEDs as per neurology Phenobarbital for burst suppression per neuro  Propofol to be D/C'd  RASS goal: -4  No family at bedside 1/27  Kathi Ludwig, MD Resident PCCM Physician Pager #: [phone]

## 2017-08-05 NOTE — Procedures (Signed)
  Electroencephalogram report- LTM     Data acquisition: 10-20 electrode placement.  Additional T1, T2, and EKG electrodes; 26 channel digital referential acquisition reformatted to 18 channel/7 channel coronal bipolar     Spike detection: ON     Seizure detection: ON   Beginning time: 08/04/2017 at 07 34  am  Ending time: 08/05/2017 at 07 22  AM  CPT: 08657 Day of study: Day 3   This  intensive EEG monitoring with simultaneous video monitoring was performed for this patient with seizures to rule out subclinical electrographic seizures. Patient is intubated and sedated. Medications: As per EMR  Day 1 : Background activity is marked by background activity slowing was more prominent polymorphic delta slowing across the left frontotemporal region.  Superimposed near continuous left anterior temporal sharp wave spikes and left periodic lateralized epileptiform discharges in that region present throughout the recording.  In addition there is brief electrographic seizures also noted arising from the left frontotemporal cortex.  Gradually patient develops a burst suppression pattern however left anterior temporal spikes persist.  During the last couple hours of recording background activity is marked by significant attenuation of background activities occasionally punctuated with left anterior temporal spikes.  Day 2: Background activity is marked by suppressed and non reactive background activities.  Superimposed  left anterior temporal  Spikes are present.  During second half of the recording  there are  electrographic seizures reoccured  arising from the left frontotemporal cortex. Frequency of  Seizures : 1 -4 per hour  Last seizures at  6 24 am .  Day 3: Background activity is marked by suppressed and non reactive background activities.  Superimposed  left anterior temporal  Spikes are present.  Several  electrographic seizures reoccured  arising from the left frontotemporal cortex were  recorded.  Last seizures at 22 39    Clinical interpretation: This day 3 of  intensive EEG monitoring with simultaneous video monitoring c/w severe encephalopathy and cortical irritability  in the left anterior temporal/frontal cortex. Electrographic seizures are still preset with last seizure at 22 39.

## 2017-08-05 NOTE — Progress Notes (Signed)
LTM checked, no skin breakdown noted

## 2017-08-05 NOTE — Progress Notes (Signed)
Wasted 30 cc's of versed witnessed by Carlus Pavlov RN

## 2017-08-05 NOTE — Progress Notes (Addendum)
Neurology Progress Note   S:// Continuing seizures on propofol and  Versed drip. Started on pentobarbital  overnight.   O:// Current vital signs: BP 118/75   Pulse 83   Temp (!) 96.8 F (36 C)   Resp 18   Ht '5\' 5"'$  (1.651 m)   Wt 88.5 kg (195 lb 1.7 oz)   SpO2 98%   BMI 32.47 kg/m   Vital signs in last 24 hours: Temp:  [94.1 F (34.5 C)-100.6 F (38.1 C)] 96.8 F (36 C) (01/27 0700) Pulse Rate:  [73-96] 83 (01/27 0700) Resp:  [18-22] 18 (01/27 0700) BP: (91-138)/(60-81) 118/75 (01/27 0700) SpO2:  [95 %-99 %] 98 % (01/27 0700) FiO2 (%):  [40 %] 40 % (01/27 0600) Weight:  [88.5 kg (195 lb 1.7 oz)] 88.5 kg (195 lb 1.7 oz) (01/27 0415)  Patient is sedated and intubated on pentobarbital. No purposeful movements. No response to noxious tinnitus Pupils are barely reactive to nonreactive. Also unable to appreciate corneal reflexes. 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   Medications  Current Facility-Administered Medications:  .  0.9 %  sodium chloride infusion, 250 mL, Intravenous, PRN, Whiteheart, Cristal Ford, NP, Stopped at 08/04/17 1600 .  acetaminophen (TYLENOL) solution 325 mg, 325 mg, Per Tube, Q6H PRN, Omar Person, NP .  chlorhexidine gluconate (MEDLINE KIT) (PERIDEX) 0.12 % solution 15 mL, 15 mL, Mouth Rinse, BID, Sampson Goon, MD, 15 mL at 08/05/17 0804 .  Chlorhexidine Gluconate Cloth 2 % PADS 6 each, 6 each, Topical, Daily, Sampson Goon, MD, 6 each at 08/04/17 1400 .  feeding supplement (PRO-STAT SUGAR FREE 64) liquid 60 mL, 60 mL, Per Tube, TID, Rush Farmer, MD, 60 mL at 08/04/17 2225 .  feeding supplement (VITAL HIGH PROTEIN) liquid 1,000 mL, 1,000 mL, Per Tube, Q24H, Rush Farmer, MD, 1,000 mL at 08/04/17 1308 .  heparin injection 5,000 Units, 5,000 Units, Subcutaneous, Q8H, Sampson Goon, MD, 5,000 Units at 08/05/17 0520 .  insulin aspart (novoLOG) injection 0-9 Units, 0-9 Units, Subcutaneous, Q4H, Jule Ser, DO, 2  Units at 08/05/17 0804 .  insulin glargine (LANTUS) injection 8 Units, 8 Units, Subcutaneous, Daily, Jule Ser, DO, 8 Units at 08/04/17 0919 .  lacosamide (VIMPAT) 200 mg in sodium chloride 0.9 % 25 mL IVPB, 200 mg, Intravenous, Q12H, Greta Doom, MD, Stopped at 08/05/17 0214 .  levETIRAcetam (KEPPRA) 500 mg in sodium chloride 0.9 % 100 mL IVPB, 500 mg, Intravenous, Q12H, Marliss Coots, PA-C, Stopped at 08/05/17 0444 .  MEDLINE mouth rinse, 15 mL, Mouth Rinse, QID, Sampson Goon, MD, 15 mL at 08/05/17 0324 .  midazolam (VERSED) 100 mg in sodium chloride 0.9 % 100 mL (1 mg/mL) infusion, 20 mg/hr, Intravenous, Continuous, Greta Doom, MD, Stopped at 08/04/17 2210 .  mupirocin cream (BACTROBAN) 2 %, , Topical, Daily, Sampson Goon, MD, 1 application at 35/45/62 [phone] .  norepinephrine (LEVOPHED) 4 mg in dextrose 5 % 250 mL (0.016 mg/mL) infusion, 0-40 mcg/min, Intravenous, Titrated, Mauri Brooklyn, MD, Stopped at 08/04/17 0022 .  pantoprazole (PROTONIX) injection 40 mg, 40 mg, Intravenous, QHS, Sampson Goon, MD, 40 mg at 08/04/17 2218 .  [COMPLETED] PENTobarbital (NEMBUTAL) 8.33 mg/mL load via infusion 905 mg, 10 mg/kg, Intravenous, Once, 905 mg at 08/04/17 2110 **AND** PENTobarbital (NEMBUTAL) 2,500 mg in dextrose 5 % 300 mL (8.3333 mg/mL) infusion, 3 mg/kg/hr, Intravenous, Continuous, Greta Doom, MD, Last Rate: 32.6 mL/hr at 08/05/17 0700, 3 mg/kg/hr at 08/05/17  0700 .  phenylephrine (NEO-SYNEPHRINE) 10 mg in sodium chloride 0.9 % 250 mL (0.04 mg/mL) infusion, 0-400 mcg/min, Intravenous, Titrated, Winfrey, William B, MD .  phenytoin (DILANTIN) injection 100 mg, 100 mg, Intravenous, Q8H, Amie Portland, MD, 100 mg at 08/05/17 0520 .  propofol (DIPRIVAN) 1000 MG/100ML infusion, 140 mcg/kg/min, Intravenous, Titrated, Greta Doom, MD, Stopped at 08/04/17 2100 .  sodium chloride flush (NS) 0.9 % injection 10-40 mL, 10-40 mL, Intracatheter, Q12H, Sampson Goon, MD, 10 mL at 08/04/17 2239 .  sodium chloride flush (NS) 0.9 % injection 10-40 mL, 10-40 mL, Intracatheter, PRN, Sampson Goon, MD .  thiamine (B-1) injection 100 mg, 100 mg, Intravenous, Daily, Sampson Goon, MD, 100 mg at 08/04/17 0913 .  topiramate (TOPAMAX) tablet 200 mg, 200 mg, Oral, BID, Greta Doom, MD, 200 mg at 08/04/17 2224 Labs CBC    Component Value Date/Time   WBC 18.3 (H) 08/05/2017 0420   RBC 4.05 08/05/2017 0420   HGB 12.6 08/05/2017 0420   HCT 39.9 08/05/2017 0420   PLT 207 08/05/2017 0420   MCV 98.5 08/05/2017 0420   MCH 31.1 08/05/2017 0420   MCHC 31.6 08/05/2017 0420   RDW 14.9 08/05/2017 0420   LYMPHSABS 2.8 08/03/2017 0303   MONOABS 1.1 (H) 08/03/2017 0303   EOSABS 0.1 08/03/2017 0303   BASOSABS 0.0 08/03/2017 0303    CMP     Component Value Date/Time   NA 139 08/05/2017 0420   K 4.6 08/05/2017 0420   CL 112 (H) 08/05/2017 0420   CO2 15 (L) 08/05/2017 0420   GLUCOSE 237 (H) 08/05/2017 0420   BUN 37 (H) 08/05/2017 0420   CREATININE 2.13 (H) 08/05/2017 0420   CREATININE 1.28 (H) 03/27/2017 1615   CALCIUM 7.7 (L) 08/05/2017 0420   PROT 5.4 (L) 08/04/2017 0454   ALBUMIN 2.0 (L) 08/04/2017 0454   AST 18 08/04/2017 0454   ALT 9 (L) 08/04/2017 0454   ALKPHOS 37 (L) 08/04/2017 0454   BILITOT 0.5 08/04/2017 0454   GFRNONAA 24 (L) 08/05/2017 0420   GFRNONAA 45 (L) 03/27/2017 1615   GFRAA 28 (L) 08/05/2017 0420   GFRAA 53 (L) 03/27/2017 1615   Imaging I have reviewed images in epic and the results pertinent to this consultation are: Noncontrast CT of the brain and MR examination of the brain unremarkable for any acute change.  Assessment:  62 year old woman with past history of hyperglycemia, cocaine abuse, seizures noncompliant with medications seen for concern for status epilepticus initially.  Initial spot EEG was negative for seizures but long-term EEG monitoring showed multiple electrographic seizures.  Started on Keppra, Dilantin  and Vimpat along with Versed and propofol drips.  Topamax was added yesterday morning.  Continue to require high doses of propofol and Versed drips with continued frequent seizures.  Last night, was started on pentobarbital drip - propofol and Versed were discontinued. Would like to continue pentobarbital at least she is at least 48 hours seizure-free given the super refractory seizures.  Impression: Super refractory status epilepticus Known history of seizures Cocaine abuse Hyperglycemia  Recommendations: Continue with pentobarbital until 48 hours seizure-free. Continue with Keppra, Vimpat, Dilantin and Topamax at current doses. Continue on LTM EEG. Attempted to communicate with family but no family available at bedside. Given the super refractory status, will need spinal tap in the coming days. Plan communicated with the ICU attending in person. We will continue to follow with you.  -- Amie Portland, MD [facility] Pager: [phone] If  7pm to 7am, please call on call as listed on AMION.  CRITICAL CARE ATTESTATION This patient is critically ill and at significant risk of neurological worsening, death and care requires constant monitoring of vital signs, hemodynamics,respiratory and cardiac monitoring. I spent 35  minutes of neurocritical care time performing neurological assessment, discussion with family, other specialists and medical decision making of high complexityin the care of  this patient.

## 2017-08-06 ENCOUNTER — Inpatient Hospital Stay (HOSPITAL_COMMUNITY): Payer: Medicaid Other

## 2017-08-06 LAB — GLUCOSE, CAPILLARY
GLUCOSE-CAPILLARY: 190 mg/dL — AB (ref 65–99)
GLUCOSE-CAPILLARY: 196 mg/dL — AB (ref 65–99)
Glucose-Capillary: 211 mg/dL — ABNORMAL HIGH (ref 65–99)
Glucose-Capillary: 218 mg/dL — ABNORMAL HIGH (ref 65–99)
Glucose-Capillary: 232 mg/dL — ABNORMAL HIGH (ref 65–99)
Glucose-Capillary: 253 mg/dL — ABNORMAL HIGH (ref 65–99)

## 2017-08-06 LAB — BASIC METABOLIC PANEL
Anion gap: 12 (ref 5–15)
BUN: 60 mg/dL — AB (ref 6–20)
CHLORIDE: 111 mmol/L (ref 101–111)
CO2: 16 mmol/L — AB (ref 22–32)
Calcium: 7.7 mg/dL — ABNORMAL LOW (ref 8.9–10.3)
Creatinine, Ser: 2.38 mg/dL — ABNORMAL HIGH (ref 0.44–1.00)
GFR calc Af Amer: 24 mL/min — ABNORMAL LOW (ref >60)
GFR, EST NON AFRICAN AMERICAN: 21 mL/min — AB (ref >60)
GLUCOSE: 250 mg/dL — AB (ref 65–99)
Potassium: 4.3 mmol/L (ref 3.5–5.1)
Sodium: 139 mmol/L (ref 135–145)

## 2017-08-06 LAB — BLOOD GAS, ARTERIAL
Acid-base deficit: 8.7 mmol/L — ABNORMAL HIGH (ref 0.0–2.0)
BICARBONATE: 17 mmol/L — AB (ref 20.0–28.0)
Drawn by: 441371
FIO2: 40
LHR: 18 {breaths}/min
MECHVT: 500 mL
O2 Saturation: 92.5 %
PEEP/CPAP: 5 cmH2O
PO2 ART: 70.2 mmHg — AB (ref 83.0–108.0)
Patient temperature: 97.8
pCO2 arterial: 38.5 mmHg (ref 32.0–48.0)
pH, Arterial: 7.265 — ABNORMAL LOW (ref 7.350–7.450)

## 2017-08-06 LAB — CBC
HEMATOCRIT: 38.6 % (ref 36.0–46.0)
HEMOGLOBIN: 12.2 g/dL (ref 12.0–15.0)
MCH: 30.7 pg (ref 26.0–34.0)
MCHC: 31.6 g/dL (ref 30.0–36.0)
MCV: 97.2 fL (ref 78.0–100.0)
Platelets: 227 10*3/uL (ref 150–400)
RBC: 3.97 MIL/uL (ref 3.87–5.11)
RDW: 14.9 % (ref 11.5–15.5)
WBC: 19.3 10*3/uL — ABNORMAL HIGH (ref 4.0–10.5)

## 2017-08-06 LAB — PHOSPHORUS: PHOSPHORUS: 7 mg/dL — AB (ref 2.5–4.6)

## 2017-08-06 LAB — MAGNESIUM: Magnesium: 2.1 mg/dL (ref 1.7–2.4)

## 2017-08-06 MED ORDER — TOPIRAMATE 100 MG PO TABS
200.0000 mg | ORAL_TABLET | Freq: Two times a day (BID) | ORAL | Status: DC
  Administered 2017-08-06 – 2017-09-14 (×78): 200 mg
  Filled 2017-08-06 (×80): qty 2

## 2017-08-06 MED ORDER — INSULIN ASPART 100 UNIT/ML ~~LOC~~ SOLN
0.0000 [IU] | SUBCUTANEOUS | Status: DC
  Administered 2017-08-06 (×2): 5 [IU] via SUBCUTANEOUS
  Administered 2017-08-06: 3 [IU] via SUBCUTANEOUS
  Administered 2017-08-07: 5 [IU] via SUBCUTANEOUS
  Administered 2017-08-07: 8 [IU] via SUBCUTANEOUS

## 2017-08-06 MED ORDER — INSULIN GLARGINE 100 UNIT/ML ~~LOC~~ SOLN
15.0000 [IU] | Freq: Every day | SUBCUTANEOUS | Status: DC
  Administered 2017-08-06 – 2017-08-07 (×2): 15 [IU] via SUBCUTANEOUS
  Filled 2017-08-06 (×3): qty 0.15

## 2017-08-06 NOTE — Progress Notes (Signed)
PULMONARY / CRITICAL CARE MEDICINE   Name: [name] MRN: [id number] DOB: 07/04/1956    ADMISSION DATE:  08/02/2017  CHIEF COMPLAINT: Found down  HISTORY OF PRESENT ILLNESS:        This is a 62 year old diabetic with a history of alcohol and cocaine abuse who is previously been admitted to this hospital for a left thalamic hemorrhage.  She was found down the day of admission having lost bowel and bladder control.  She was brought to the [facility] ED where she was observed to have overt clinical seizure activity.  She was treated with Keppra fosphenytoin and Ativan with subsequent cessation of the clinical seizures. Seizure activity continued subclinically despite high dose propofol and versed. This was discontinued and replaced with pentobarbital for a goal of >48 hours without burst activity as per EEG.    SUBJECTIVE:  Propofol and versed discontinued overnight with addition of pentobarbital due to ongoing seizure activity as noted on continuous EEG.   VITAL SIGNS: BP 119/72 (BP Location: Left Arm)   Pulse 81   Temp 97.7 F (36.5 C) (Oral)   Resp 18   Ht 5\' 5"  (1.651 m)   Wt 201 lb 15.1 oz (91.6 kg)   SpO2 95%   BMI 33.60 kg/m   HEMODYNAMICS:   VENTILATOR SETTINGS: Vent Mode: PRVC FiO2 (%):  [40 %] 40 % Set Rate:  [18 bmp] 18 bmp Vt Set:  [500 mL] 500 mL PEEP:  [5 cmH20] 5 cmH20 Plateau Pressure:  [21 cmH20-23 cmH20] 22 cmH20  INTAKE / OUTPUT: I/O last 3 completed shifts: In: 3852.2 [I.V.:2747.5; NG/GT:654.7; IV Piggyback:450] Out: 4270 [Urine:1630]  PHYSICAL EXAMINATION: General:  Chronically ill appearing female, remains heavily sedated Neuro: RASS -4 due to chemically induced coma, no response to commands HEENT: MM pink/moist, Pupils equal unreactive to light with stable opacity of the lens bilaterally  CV: s1s2 RRR, no m/r/g auscultated PULM: Mild rhonchi bilaterally significantly more prominent in the right, no wheezing noted. Breath sounds adequate  GI:  soft, non-tender, bowel sounds active  Extremities: warm/dry, mild +1/2-1 pedal edema bilaterally Skin: no rashes or lesions  LABS:  BMET Recent Labs  Lab 08/04/17 0454 08/05/17 0420 08/06/17 0410  NA 139 139 139  K 3.6 4.6 4.3  CL 110 112* 111  CO2 17* 15* 16*  BUN 15 37* 60*  CREATININE 2.04* 2.13* 2.38*  GLUCOSE 153* 237* 250*   Electrolytes Recent Labs  Lab 08/03/17 1537 08/04/17 0454 08/05/17 0420 08/06/17 0410  CALCIUM 8.1* 7.7* 7.7* 7.7*  MG 2.5* 2.2  --  2.1  PHOS 3.2 5.2*  --  7.0*   CBC Recent Labs  Lab 08/04/17 0454 08/05/17 0420 08/06/17 0410  WBC 11.8* 18.3* 19.3*  HGB 12.2 12.6 12.2  HCT 36.7 39.9 38.6  PLT 222 207 227   Coag's No results for input(s): APTT, INR in the last 168 hours.  Sepsis Markers Recent Labs  Lab 08/02/17 1404 08/02/17 1748 08/05/17 1237  LATICACIDVEN 2.23* 2.96* 1.8   ABG Recent Labs  Lab 08/02/17 1713 08/02/17 1936 08/05/17 0919  PHART 7.056* 7.269* 7.260*  PCO2ART 96.7* 50.9* 34.1  PO2ART 130* 81.0* 86.0   Liver Enzymes Recent Labs  Lab 08/02/17 1347 08/04/17 0454  AST 57* 18  ALT 20 9*  ALKPHOS 55 37*  BILITOT 2.2* 0.5  ALBUMIN 2.8* 2.0*   Cardiac Enzymes No results for input(s): TROPONINI, PROBNP in the last 168 hours.  Glucose Recent Labs  Lab 08/05/17 0754 08/05/17 1217  08/05/17 1549 08/05/17 1950 08/06/17 0001 08/06/17 0324  GLUCAP 196* 202* 172* 195* 210* 232*   Imaging Dg Chest Port 1 View  Result Date: 08/06/2017 CLINICAL DATA:  Intubated. EXAM: PORTABLE CHEST 1 VIEW COMPARISON:  08/05/2017 FINDINGS: Endotracheal tube terminates just below the clavicular heads, well above the carina. Left jugular catheter terminates over the lower SVC tube courses into the left upper abdomen with tip not imaged. The cardiac silhouette remains mildly enlarged. Aortic atherosclerosis is noted. Partially veiling opacities in the right greater than lung bases are unchanged and likely represent a  combination of small pleural effusions and airspace opacity. No pneumothorax is identified. IMPRESSION: Unchanged bibasilar airspace disease and pleural effusions, right larger than left. Electronically Signed   By: Logan Bores M.D.   On: 08/06/2017 06:43   STUDIES:  EEG 1/24>>>This is an abnormal EEG.  There is evidence of moderate generalized slowing of brain activity as well as severe focal slowing in the left temporal lobe.  The patient is not in non-convulsive status epilepticus. 2D echo 1/25>>> EF 31-54%, grade 2 diastolic dysfunction  MRI brain 1/25>>> 1. No acute intracranial abnormality. 2. 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 3. Remote hemorrhage in the left thalamus and left pons. 4. Age advanced atrophy. 5. Fluid in the nasopharynx and paranasal sinuses is likely secondary to intubation.  ANTIBIOTICS: N/A  LINES/TUBES: CVC 1/24>>> ETT 1/24>>> NG/OG 1/24>>>  DISCUSSION: 61 yoF with a history of polysubstance abuse who presented after being found down to the ED whereupon she was observed to be in status epilepticus. She was loaded with anticonvulsants and intubated for airway protection. In addition she was found to be hyperglycemic with borderline anion gap. Patient remains heavily sedated to date for seizure suppression.   ASSESSMENT / PLAN:   PULMONARY A: Acute respiratory failure 2/2 acute encephalopathy and seizure CXR-no improvement  P:   Vent support - 8cc/kg  F/u CXR  No weaning until burst suppression accomplished x 48 hours  CARDIOVASCULAR A:  HTN  HFpEF: Echo-G2DD Cocaine abuse Hx w/ positive urine screen this admission P:  Continue telemetry Echo as above  Pressors as needed for BP support with high dose propofol  EKG-grossly prolonged QTc  RENAL A:   AKI  Hypomagnesemia-resolved Hyperphosphatemia Non-gap metabolic acidosis (Lactate, B-hydroxybutyric acid-WNL's) Random urine Cr 108, Urine Na/K <10/62, urine osmolality 364   P:   F/u chem  Urine chloride and nitrogen pending  Gentle volume for AKI  GASTROINTESTINAL A:   No active issue  P:   PPI  Tube feeds Nutrition consulted to guide feeding therapy  HEMATOLOGIC A:   No active issue  Hgb WNL's P:  CBC in am Heparin for DVT ppx  INFECTIOUS A:   No active issue  Initially some concern for aspiration  P:    Monitor off abx for now  VAP prevention  ENDOCRINE A:   DKA - resolved.  Non-insulin dependent-DM  P:   SSI and lantus QHS  NEUROLOGIC A:   AMS  Seizure  Status epilepticus  P:   Continuous  EEG monitoring Continue AEDs as per neurology Phenobarbital for burst suppression per neuro  Propofol D/C'd  Versed D/C'd RASS goal: -4  No family at bedside 1/27  Kathi Ludwig, MD Resident PCCM Physician Pager #: [phone]  Attending Note:  62 year old female with PMH of polysubstance abuse presenting with seizure and now in burst suppression protocol with pentobarb.  On exam, completely unresponsive with clear lungs.  I reviewed CXR myself, ETT is in good position.  Discussed with PCCM-NP.  Will continue full vent support.  ABG now.  Adjust vent for ABG.  Hold off pressors.  Will need to discuss with neurology plan for burst suppression.  Levophed for BP support.  The patient is critically ill with multiple organ systems failure and requires high complexity decision making for assessment and support, frequent evaluation and titration of therapies, application of advanced monitoring technologies and extensive interpretation of multiple databases.   Critical Care Time devoted to patient care services described in this note is  35  Minutes. This time reflects time of care of this signee Dr Jennet Maduro. This critical care time does not reflect procedure time, or teaching time or supervisory time of PA/NP/Med student/Med Resident etc but could involve care discussion time.  Rush Farmer, M.D. [facility] Pulmonary/Critical Care  Medicine. Pager: [phone]. After hours pager: [phone].

## 2017-08-06 NOTE — Progress Notes (Signed)
LTM shows mostly suppressed background with intermittent bursts every 10-15  Seconds. No definite seizures seen. Patient is off pentobarbital.

## 2017-08-06 NOTE — Procedures (Signed)
  Electroencephalogram report- LTM     Data acquisition: 10-20 electrode placement.  Additional T1, T2, and EKG electrodes; 26 channel digital referential acquisition reformatted to 18 channel/7 channel coronal bipolar     Spike detection: ON     Seizure detection: ON   Beginning time: 08/05/2017 at 07 34  am  Ending time: 08/06/2017 at 07 22  AM  CPT: 49201 Day of study: Day 4   This  intensive EEG monitoring with simultaneous video monitoring was performed for this patient with seizures to rule out subclinical electrographic seizures. Patient is intubated and sedated. Medications: As per EMR  Day 1 : Background activity is marked by background activity slowing was more prominent polymorphic delta slowing across the left frontotemporal region.  Superimposed near continuous left anterior temporal sharp wave spikes and left periodic lateralized epileptiform discharges in that region present throughout the recording.  In addition there is brief electrographic seizures also noted arising from the left frontotemporal cortex.  Gradually patient develops a burst suppression pattern however left anterior temporal spikes persist.  During the last couple hours of recording background activity is marked by significant attenuation of background activities occasionally punctuated with left anterior temporal spikes.  Day 2: Background activity is marked by suppressed and non reactive background activities.  Superimposed  left anterior temporal  Spikes are present.  During second half of the recording  there are  electrographic seizures reoccured  arising from the left frontotemporal cortex. Frequency of  Seizures : 1 -4 per hour  Last seizures at  6 24 am .  Day 3: Background activity is marked by suppressed and non reactive background activities.  Superimposed  left anterior temporal  Spikes are present.  Several  electrographic seizures reoccured  arising from the left frontotemporal cortex were  recorded.  Last seizures at 22 39   Day 4: No seizures. Background activity is marked by suppressed and non reactive background activities with  Superimposed  occasional  left anterior temporal  Spikes .    Clinical interpretation: This day 4 of  intensive EEG monitoring with simultaneous video monitoring c/w severe encephalopathy and cortical irritability  in the left anterior temporal/frontal cortex. Electrographic seizures were not present.

## 2017-08-06 NOTE — Progress Notes (Signed)
LTM checked, no skin breakdown noted on frontal leads. Imp below 10K

## 2017-08-06 NOTE — Consult Note (Signed)
Regino Ramirez Nurse wound follow up Wound type:Reconsulted for right anterior lower leg wound.  Wound is unchanged.  Albumin is 2.0 with delayed wound healing likely and malnutrition.  Will continue with mupirocin ointment as ordered.  No changes at this time.  Will not follow at this time.  Please re-consult if needed.  Domenic Moras RN BSN [place] Pager [phone]

## 2017-08-06 NOTE — Consult Note (Addendum)
#####This is a progress note/Not a new consult note. #####   Subjective: Intubated and sedated   ROS:  Unable to obtain due to sedation Examination  Vital signs in last 24 hours: Temp:  [96.4 F (35.8 C)-98.1 F (36.7 C)] 97.8 F (36.6 C) (01/28 0841) Pulse Rate:  [77-82] 81 (01/28 0945) Resp:  [18] 18 (01/28 0945) BP: (88-125)/(60-77) 121/74 (01/28 0945) SpO2:  [91 %-98 %] 96 % (01/28 0945) FiO2 (%):  [40 %] 40 % (01/28 0801) Weight:  [91.6 kg (201 lb 15.1 oz)] 91.6 kg (201 lb 15.1 oz) (01/28 0400)  General: Not in distress, cooperative CVS: pulse-normal rate and rhythm RS: intubated Extremities: normal   Neuro: MS: Comatose,  CN: pupils equal and sluggish, oculocephalic reflex absent, gag reflex absent Motor: no withdrawal   Basic Metabolic Panel: Recent Labs  Lab 08/02/17 1715  08/03/17 0303 08/03/17 1100 08/03/17 1537 08/04/17 0454 08/05/17 0420 08/06/17 0410  NA  --    < > 146* 140 141 139 139 139  K  --    < > 2.6* 3.5 3.0* 3.6 4.6 4.3  CL  --    < > 115* 108 112* 110 112* 111  CO2  --    < > 21* 20* 17* 17* 15* 16*  GLUCOSE  --    < > 110* 251* 142* 153* 237* 250*  BUN  --    < > 13 13 14 15  37* 60*  CREATININE  --    < > 1.41* 1.73* 1.76* 2.04* 2.13* 2.38*  CALCIUM  --    < > 8.2* 8.0* 8.1* 7.7* 7.7* 7.7*  MG 1.8  --  1.4*  --  2.5* 2.2  --  2.1  PHOS 7.0*  --  1.6*  --  3.2 5.2*  --  7.0*   < > = values in this interval not displayed.    CBC: Recent Labs  Lab 08/02/17 1956 08/03/17 0303 08/04/17 0454 08/05/17 0420 08/06/17 0410  WBC 15.4* 13.9* 11.8* 18.3* 19.3*  NEUTROABS  --  9.9*  --   --   --   HGB 15.0 12.3 12.2 12.6 12.2  HCT 47.3* 38.2 36.7 39.9 38.6  MCV 98.5 96.2 94.6 98.5 97.2  PLT 270 269 222 207 227     Coagulation Studies: No results for input(s): LABPROT, INR in the last 72 hours.  Imaging Reviewed:     ASSESSMENT AND PLAN   62 year old woman with past history of hyperglycemia, cocaine abuse, seizures  noncompliant with medications seen for concern for status epilepticus initially.  Initial spot EEG was negative for seizures but long-term EEG monitoring showed multiple electrographic seizures.  Started on Keppra, Dilantin and Vimpat along with Versed and propofol drips.  Topamax was added yesterday morning.  Continue to require high doses of propofol and Versed drips with continued frequent seizures. She was started on pentobarbital drip - propofol and Versed were discontinued. Pentobarbital stopped last night due to full suppression on EEG, restarted at lower dose this morning due to increase frequency of periodic discharges.    Impression: Super refractory status epilepticus Known history of seizures Cocaine abuse Hyperglycemia  Recommendations: Continue with pentobarbital, lower dose and will titrate based on EEG Continue with Keppra, Vimpat, Dilantin and Topamax at current doses. Continue on LTM EEG. Given the super refractory status, will need spinal tap in the coming days  Plan communicated with the ICU attending in person. We will continue to follow with you.  Shaynna Husby  [facility] Pager Number [phone] For questions after 7pm please refer to AMION to reach the Neurologist on call

## 2017-08-07 ENCOUNTER — Inpatient Hospital Stay (HOSPITAL_COMMUNITY): Payer: Medicaid Other

## 2017-08-07 LAB — GLUCOSE, CAPILLARY
GLUCOSE-CAPILLARY: 240 mg/dL — AB (ref 65–99)
GLUCOSE-CAPILLARY: 229 mg/dL — AB (ref 65–99)
Glucose-Capillary: 234 mg/dL — ABNORMAL HIGH (ref 65–99)
Glucose-Capillary: 140 mg/dL — ABNORMAL HIGH (ref 65–99)
Glucose-Capillary: 210 mg/dL — ABNORMAL HIGH (ref 65–99)
Glucose-Capillary: 280 mg/dL — ABNORMAL HIGH (ref 65–99)

## 2017-08-07 LAB — BASIC METABOLIC PANEL
Anion gap: 9 (ref 5–15)
BUN: 75 mg/dL — AB (ref 6–20)
CHLORIDE: 117 mmol/L — AB (ref 101–111)
CO2: 19 mmol/L — AB (ref 22–32)
CREATININE: 1.93 mg/dL — AB (ref 0.44–1.00)
Calcium: 7.9 mg/dL — ABNORMAL LOW (ref 8.9–10.3)
GFR calc Af Amer: 31 mL/min — ABNORMAL LOW (ref >60)
GFR calc non Af Amer: 27 mL/min — ABNORMAL LOW (ref >60)
GLUCOSE: 220 mg/dL — AB (ref 65–99)
Potassium: 3.8 mmol/L (ref 3.5–5.1)
SODIUM: 145 mmol/L (ref 135–145)

## 2017-08-07 LAB — HEPATIC FUNCTION PANEL
ALBUMIN: 1.8 g/dL — AB (ref 3.5–5.0)
ALK PHOS: 62 U/L (ref 38–126)
ALT: 14 U/L (ref 14–54)
AST: 30 U/L (ref 15–41)
Bilirubin, Direct: 0.1 mg/dL (ref 0.1–0.5)
Indirect Bilirubin: 0.1 mg/dL — ABNORMAL LOW (ref 0.3–0.9)
TOTAL PROTEIN: 6.7 g/dL (ref 6.5–8.1)
Total Bilirubin: 0.2 mg/dL — ABNORMAL LOW (ref 0.3–1.2)

## 2017-08-07 LAB — POCT I-STAT 3, ART BLOOD GAS (G3+)
Acid-base deficit: 9 mmol/L — ABNORMAL HIGH (ref 0.0–2.0)
Bicarbonate: 18.5 mmol/L — ABNORMAL LOW (ref 20.0–28.0)
O2 Saturation: 91 %
PCO2 ART: 43.2 mmHg (ref 32.0–48.0)
PH ART: 7.237 — AB (ref 7.350–7.450)
PO2 ART: 68 mmHg — AB (ref 83.0–108.0)
Patient temperature: 97.5
TCO2: 20 mmol/L — ABNORMAL LOW (ref 22–32)

## 2017-08-07 LAB — PHENYTOIN LEVEL, TOTAL: PHENYTOIN LVL: 18.3 ug/mL (ref 10.0–20.0)

## 2017-08-07 LAB — CBC
HCT: 38.5 % (ref 36.0–46.0)
HEMOGLOBIN: 12 g/dL (ref 12.0–15.0)
MCH: 30.8 pg (ref 26.0–34.0)
MCHC: 31.2 g/dL (ref 30.0–36.0)
MCV: 98.7 fL (ref 78.0–100.0)
Platelets: 275 10*3/uL (ref 150–400)
RBC: 3.9 MIL/uL (ref 3.87–5.11)
RDW: 15.5 % (ref 11.5–15.5)
WBC: 16.4 10*3/uL — ABNORMAL HIGH (ref 4.0–10.5)

## 2017-08-07 LAB — PHOSPHORUS: Phosphorus: 4.4 mg/dL (ref 2.5–4.6)

## 2017-08-07 LAB — MAGNESIUM: MAGNESIUM: 2.2 mg/dL (ref 1.7–2.4)

## 2017-08-07 LAB — UREA NITROGEN, URINE: UREA NITROGEN UR: 328 mg/dL

## 2017-08-07 MED ORDER — PHENYTOIN SODIUM 50 MG/ML IJ SOLN
100.0000 mg | Freq: Two times a day (BID) | INTRAMUSCULAR | Status: DC
  Filled 2017-08-07: qty 2

## 2017-08-07 MED ORDER — POTASSIUM CHLORIDE 20 MEQ/15ML (10%) PO SOLN
40.0000 meq | Freq: Three times a day (TID) | ORAL | Status: AC
  Administered 2017-08-07 (×2): 40 meq
  Filled 2017-08-07 (×2): qty 30

## 2017-08-07 MED ORDER — INSULIN ASPART 100 UNIT/ML ~~LOC~~ SOLN
0.0000 [IU] | SUBCUTANEOUS | Status: DC
  Administered 2017-08-07: 3 [IU] via SUBCUTANEOUS
  Administered 2017-08-07: 11 [IU] via SUBCUTANEOUS
  Administered 2017-08-07 (×3): 7 [IU] via SUBCUTANEOUS
  Administered 2017-08-08: 4 [IU] via SUBCUTANEOUS
  Administered 2017-08-08: 20 [IU] via SUBCUTANEOUS
  Administered 2017-08-08 (×2): 11 [IU] via SUBCUTANEOUS
  Administered 2017-08-08: 15 [IU] via SUBCUTANEOUS
  Administered 2017-08-09: 7 [IU] via SUBCUTANEOUS
  Administered 2017-08-09: 3 [IU] via SUBCUTANEOUS
  Administered 2017-08-09: 7 [IU] via SUBCUTANEOUS
  Administered 2017-08-09: 4 [IU] via SUBCUTANEOUS
  Administered 2017-08-09: 7 [IU] via SUBCUTANEOUS
  Administered 2017-08-09 – 2017-08-10 (×3): 4 [IU] via SUBCUTANEOUS
  Administered 2017-08-10: 11 [IU] via SUBCUTANEOUS
  Administered 2017-08-10: 7 [IU] via SUBCUTANEOUS
  Administered 2017-08-10 – 2017-08-11 (×2): 4 [IU] via SUBCUTANEOUS
  Administered 2017-08-11: 11 [IU] via SUBCUTANEOUS
  Administered 2017-08-11 (×3): 4 [IU] via SUBCUTANEOUS
  Administered 2017-08-11: 7 [IU] via SUBCUTANEOUS
  Administered 2017-08-12 (×5): 4 [IU] via SUBCUTANEOUS
  Administered 2017-08-12: 7 [IU] via SUBCUTANEOUS
  Administered 2017-08-13: 11 [IU] via SUBCUTANEOUS
  Administered 2017-08-13: 4 [IU] via SUBCUTANEOUS
  Administered 2017-08-13: 7 [IU] via SUBCUTANEOUS
  Administered 2017-08-13 – 2017-08-14 (×9): 4 [IU] via SUBCUTANEOUS
  Administered 2017-08-15: 3 [IU] via SUBCUTANEOUS
  Administered 2017-08-15 (×4): 4 [IU] via SUBCUTANEOUS
  Administered 2017-08-15: 3 [IU] via SUBCUTANEOUS
  Administered 2017-08-16: 4 [IU] via SUBCUTANEOUS
  Administered 2017-08-16: 3 [IU] via SUBCUTANEOUS
  Administered 2017-08-16: 4 [IU] via SUBCUTANEOUS
  Administered 2017-08-16 (×2): 7 [IU] via SUBCUTANEOUS
  Administered 2017-08-16: 3 [IU] via SUBCUTANEOUS
  Administered 2017-08-17 (×3): 4 [IU] via SUBCUTANEOUS
  Administered 2017-08-17: 3 [IU] via SUBCUTANEOUS
  Administered 2017-08-17: 4 [IU] via SUBCUTANEOUS
  Administered 2017-08-17 – 2017-08-18 (×2): 3 [IU] via SUBCUTANEOUS
  Administered 2017-08-18: 4 [IU] via SUBCUTANEOUS
  Administered 2017-08-18 – 2017-08-19 (×3): 3 [IU] via SUBCUTANEOUS
  Administered 2017-08-20: 4 [IU] via SUBCUTANEOUS
  Administered 2017-08-20: 3 [IU] via SUBCUTANEOUS
  Administered 2017-08-20: 4 [IU] via SUBCUTANEOUS
  Administered 2017-08-20: 3 [IU] via SUBCUTANEOUS
  Administered 2017-08-20 – 2017-08-21 (×4): 4 [IU] via SUBCUTANEOUS
  Administered 2017-08-21: 7 [IU] via SUBCUTANEOUS
  Administered 2017-08-21: 4 [IU] via SUBCUTANEOUS
  Administered 2017-08-21: 3 [IU] via SUBCUTANEOUS
  Administered 2017-08-22 (×2): 4 [IU] via SUBCUTANEOUS
  Administered 2017-08-22: 3 [IU] via SUBCUTANEOUS
  Administered 2017-08-22 – 2017-08-23 (×3): 4 [IU] via SUBCUTANEOUS
  Administered 2017-08-23: 3 [IU] via SUBCUTANEOUS
  Administered 2017-08-23: 4 [IU] via SUBCUTANEOUS
  Administered 2017-08-23: 11 [IU] via SUBCUTANEOUS
  Administered 2017-08-23 – 2017-08-24 (×2): 4 [IU] via SUBCUTANEOUS
  Administered 2017-08-24: 7 [IU] via SUBCUTANEOUS
  Administered 2017-08-24: 4 [IU] via SUBCUTANEOUS
  Administered 2017-08-24: 3 [IU] via SUBCUTANEOUS
  Administered 2017-08-24 (×3): 4 [IU] via SUBCUTANEOUS
  Administered 2017-08-25: 7 [IU] via SUBCUTANEOUS
  Administered 2017-08-25 (×4): 4 [IU] via SUBCUTANEOUS
  Administered 2017-08-26 (×4): 7 [IU] via SUBCUTANEOUS
  Administered 2017-08-26: 11 [IU] via SUBCUTANEOUS
  Administered 2017-08-26 – 2017-08-27 (×2): 4 [IU] via SUBCUTANEOUS
  Administered 2017-08-27: 7 [IU] via SUBCUTANEOUS
  Administered 2017-08-27 (×2): 4 [IU] via SUBCUTANEOUS
  Administered 2017-08-27: 3 [IU] via SUBCUTANEOUS
  Administered 2017-08-27 (×2): 4 [IU] via SUBCUTANEOUS
  Administered 2017-08-28: 11 [IU] via SUBCUTANEOUS
  Administered 2017-08-28 (×2): 4 [IU] via SUBCUTANEOUS
  Administered 2017-08-28: 7 [IU] via SUBCUTANEOUS
  Administered 2017-08-28: 11 [IU] via SUBCUTANEOUS
  Administered 2017-08-29 (×4): 7 [IU] via SUBCUTANEOUS
  Administered 2017-08-29: 3 [IU] via SUBCUTANEOUS
  Administered 2017-08-29: 4 [IU] via SUBCUTANEOUS
  Administered 2017-08-30: 11 [IU] via SUBCUTANEOUS
  Administered 2017-08-30 (×3): 7 [IU] via SUBCUTANEOUS
  Administered 2017-08-30: 4 [IU] via SUBCUTANEOUS
  Administered 2017-08-30: 7 [IU] via SUBCUTANEOUS
  Administered 2017-08-31: 4 [IU] via SUBCUTANEOUS
  Administered 2017-08-31: 11 [IU] via SUBCUTANEOUS
  Administered 2017-08-31 (×2): 7 [IU] via SUBCUTANEOUS
  Administered 2017-08-31: 4 [IU] via SUBCUTANEOUS
  Administered 2017-08-31: 11 [IU] via SUBCUTANEOUS
  Administered 2017-09-01: 4 [IU] via SUBCUTANEOUS
  Administered 2017-09-01: 7 [IU] via SUBCUTANEOUS
  Administered 2017-09-01: 11 [IU] via SUBCUTANEOUS
  Administered 2017-09-01: 4 [IU] via SUBCUTANEOUS
  Administered 2017-09-01: 7 [IU] via SUBCUTANEOUS
  Administered 2017-09-01 – 2017-09-02 (×4): 4 [IU] via SUBCUTANEOUS
  Administered 2017-09-02 (×2): 7 [IU] via SUBCUTANEOUS
  Administered 2017-09-02 – 2017-09-03 (×3): 4 [IU] via SUBCUTANEOUS
  Administered 2017-09-03: 7 [IU] via SUBCUTANEOUS
  Administered 2017-09-03 (×3): 4 [IU] via SUBCUTANEOUS
  Administered 2017-09-04 (×3): 3 [IU] via SUBCUTANEOUS
  Administered 2017-09-04: 7 [IU] via SUBCUTANEOUS
  Administered 2017-09-05 (×2): 3 [IU] via SUBCUTANEOUS
  Administered 2017-09-05 – 2017-09-06 (×2): 4 [IU] via SUBCUTANEOUS
  Administered 2017-09-06: 3 [IU] via SUBCUTANEOUS
  Administered 2017-09-06 (×3): 4 [IU] via SUBCUTANEOUS
  Administered 2017-09-07: 3 [IU] via SUBCUTANEOUS
  Administered 2017-09-07: 4 [IU] via SUBCUTANEOUS
  Administered 2017-09-07: 3 [IU] via SUBCUTANEOUS
  Administered 2017-09-08 (×2): 4 [IU] via SUBCUTANEOUS
  Administered 2017-09-08: 3 [IU] via SUBCUTANEOUS
  Administered 2017-09-08: 7 [IU] via SUBCUTANEOUS
  Administered 2017-09-09 (×5): 4 [IU] via SUBCUTANEOUS
  Administered 2017-09-10: 3 [IU] via SUBCUTANEOUS
  Administered 2017-09-10: 2 [IU] via SUBCUTANEOUS
  Administered 2017-09-10 (×2): 7 [IU] via SUBCUTANEOUS
  Administered 2017-09-10: 3 [IU] via SUBCUTANEOUS
  Administered 2017-09-11: 4 [IU] via SUBCUTANEOUS
  Administered 2017-09-11: 3 [IU] via SUBCUTANEOUS
  Administered 2017-09-11: 4 [IU] via SUBCUTANEOUS
  Administered 2017-09-11: 3 [IU] via SUBCUTANEOUS
  Administered 2017-09-12: 4 [IU] via SUBCUTANEOUS
  Administered 2017-09-12: 7 [IU] via SUBCUTANEOUS
  Administered 2017-09-12: 3 [IU] via SUBCUTANEOUS
  Administered 2017-09-12: 7 [IU] via SUBCUTANEOUS
  Administered 2017-09-12: 3 [IU] via SUBCUTANEOUS
  Administered 2017-09-12 – 2017-09-13 (×4): 4 [IU] via SUBCUTANEOUS
  Administered 2017-09-13: 7 [IU] via SUBCUTANEOUS
  Administered 2017-09-13 – 2017-09-14 (×6): 4 [IU] via SUBCUTANEOUS

## 2017-08-07 MED ORDER — BISACODYL 10 MG RE SUPP
10.0000 mg | Freq: Every day | RECTAL | Status: DC
  Administered 2017-08-12 – 2017-08-19 (×4): 10 mg via RECTAL
  Filled 2017-08-07 (×6): qty 1

## 2017-08-07 MED ORDER — FUROSEMIDE 10 MG/ML IJ SOLN
40.0000 mg | Freq: Three times a day (TID) | INTRAMUSCULAR | Status: AC
  Administered 2017-08-07 (×2): 40 mg via INTRAVENOUS
  Filled 2017-08-07 (×2): qty 4

## 2017-08-07 MED ORDER — DOCUSATE SODIUM 50 MG/5ML PO LIQD
50.0000 mg | Freq: Every day | ORAL | Status: DC
  Administered 2017-08-07 – 2017-08-26 (×13): 50 mg
  Filled 2017-08-07 (×20): qty 10

## 2017-08-07 NOTE — Care Management Note (Signed)
Case Management Note  Patient Details  Name: [name] MRN: [id number] Date of Birth: [birth year]  Subjective/Objective:     Pt admitted post being found down  - once she arrived to [facility] ED pt was found to have overt clinical seizure activity - pt remains ventilated             Action/Plan:  PTA from home with [facility] for [facility] - agency to follow back up with CM to inform of [facility] modalities.  Toxicology screen positive for cocaine - CSW consulted   Expected Discharge Date:                  Expected Discharge Plan:  [place]  In-House Referral:     Discharge planning Services  CM Consult  Post Acute Care Choice:    Choice offered to:     DME Arranged:    DME Agency:     HH Arranged:    HH Agency:     Status of Service:     If discussed at [employer], dates discussed:    Additional Comments:  Maryclare Labrador, RN 08/07/2017, 4:17 PM

## 2017-08-07 NOTE — Progress Notes (Signed)
PULMONARY / CRITICAL CARE MEDICINE   Name: [name] MRN: [id number] DOB: Dec 25, 1955    ADMISSION DATE:  08/02/2017  CHIEF COMPLAINT: Found down  HISTORY OF PRESENT ILLNESS:        This is a 62 year old diabetic with a history of alcohol and cocaine abuse who is previously been admitted to this hospital for a left thalamic hemorrhage.  She was found down the day of admission having lost bowel and bladder control.  She was brought to the [facility] ED where she was observed to have overt clinical seizure activity.  She was treated with Keppra fosphenytoin and Ativan with subsequent cessation of the clinical seizures. Seizure activity continued subclinically despite high dose propofol and versed. This was discontinued and replaced with pentobarbital for a goal of >48 hours without burst activity as per EEG.    SUBJECTIVE:  Pentobarbital discontinued as per Neurology.   VITAL SIGNS: BP 138/78   Pulse 86   Temp (!) 97.5 F (36.4 C) (Oral)   Resp 19   Ht 5\' 5"  (1.651 m)   Wt 205 lb 7.5 oz (93.2 kg)   SpO2 98%   BMI 34.19 kg/m   HEMODYNAMICS:   VENTILATOR SETTINGS: Vent Mode: PRVC FiO2 (%):  [40 %-50 %] 50 % Set Rate:  [18 bmp] 18 bmp Vt Set:  [500 mL] 500 mL PEEP:  [5 cmH20] 5 cmH20 Plateau Pressure:  [23 cmH20-26 cmH20] 26 cmH20  INTAKE / OUTPUT: I/O last 3 completed shifts: In: 2904 [I.V.:434; NG/GT:2020; IV Piggyback:450] Out: 2022 [Urine:2022]  PHYSICAL EXAMINATION: General:  Chronically ill appearing female, remains heavily sedated Neuro: RASS -4 due to chemically induced coma, no response to commands HEENT: MM pink/moist, pupils equal unreactive to light with stable opacity of the lens bilaterally  CV: s1s2 RRR, no m/r/g auscultated PULM: Crackles bilaterally, significantly more prominent in the right, no wheezing noted. Breath sounds adequate GI: soft, non-tender, bowel sounds active Extremities: warm/dry, mild +1/2-1 pedal edema bilaterally, stable Skin: no  rashes or lesions  LABS:  BMET Recent Labs  Lab 08/05/17 0420 08/06/17 0410 08/07/17 0440  NA 139 139 145  K 4.6 4.3 3.8  CL 112* 111 117*  CO2 15* 16* 19*  BUN 37* 60* 75*  CREATININE 2.13* 2.38* 1.93*  GLUCOSE 237* 250* 220*   Electrolytes Recent Labs  Lab 08/04/17 0454 08/05/17 0420 08/06/17 0410 08/07/17 0440  CALCIUM 7.7* 7.7* 7.7* 7.9*  MG 2.2  --  2.1 2.2  PHOS 5.2*  --  7.0* 4.4   CBC Recent Labs  Lab 08/05/17 0420 08/06/17 0410 08/07/17 0440  WBC 18.3* 19.3* 16.4*  HGB 12.6 12.2 12.0  HCT 39.9 38.6 38.5  PLT 207 227 275   Coag's No results for input(s): APTT, INR in the last 168 hours.  Sepsis Markers Recent Labs  Lab 08/02/17 1404 08/02/17 1748 08/05/17 1237  LATICACIDVEN 2.23* 2.96* 1.8   ABG Recent Labs  Lab 08/05/17 0919 08/06/17 0958 08/07/17 0430  PHART 7.260* 7.265* 7.237*  PCO2ART 34.1 38.5 43.2  PO2ART 86.0 70.2* 68.0*   Liver Enzymes Recent Labs  Lab 08/02/17 1347 08/04/17 0454  AST 57* 18  ALT 20 9*  ALKPHOS 55 37*  BILITOT 2.2* 0.5  ALBUMIN 2.8* 2.0*   Cardiac Enzymes No results for input(s): TROPONINI, PROBNP in the last 168 hours.  Glucose Recent Labs  Lab 08/06/17 0838 08/06/17 1110 08/06/17 1527 08/06/17 2004 08/06/17 2343 08/07/17 0412  GLUCAP 190* 211* 196* 218* 253* 234*  Imaging Dg Chest Port 1 View  Result Date: 08/07/2017 CLINICAL DATA:  Endotracheal tube EXAM: PORTABLE CHEST 1 VIEW COMPARISON:  Yesterday FINDINGS: Endotracheal tube tip between the clavicular heads and carina. Left IJ central line with tip at the upper cavoatrial junction. An orogastric tube at least reaches the stomach Mild cardiopericardial enlargement, stable. Hazy lower chest opacities are also stable. No Kerley lines or pneumothorax. IMPRESSION: 1. Unchanged positioning of tubes and central line. 2. Unchanged hazy opacities at the bases which could reflect atelectasis, small pleural effusion, and/or infection. Electronically  Signed   By: Monte Fantasia M.D.   On: 08/07/2017 07:03   STUDIES:  EEG 1/24>>>This is an abnormal EEG.  There is evidence of moderate generalized slowing of brain activity as well as severe focal slowing in the left temporal lobe.  The patient is not in non-convulsive status epilepticus. 2D echo 1/25>>> EF 18-84%, grade 2 diastolic dysfunction  MRI brain 1/25>>> 1. No acute intracranial abnormality. 2. 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 3. Remote hemorrhage in the left thalamus and left pons. 4. Age advanced atrophy. 5. Fluid in the nasopharynx and paranasal sinuses is likely secondary to intubation.  ANTIBIOTICS: N/A  LINES/TUBES: CVC 1/24>>> ETT 1/24>>> NG/OG 1/24>>>  DISCUSSION: 33 yoF with a history of polysubstance abuse who presented after being found down to the ED whereupon she was observed to be in status epilepticus. She was loaded with anticonvulsants and intubated for airway protection. In addition she was found to be hyperglycemic with borderline anion gap. Patient remains unresponsive despite discontinuation of sedation.  ASSESSMENT / PLAN:   PULMONARY A: Acute respiratory failure 2/2 acute encephalopathy and seizure CXR-no significant changes noted bilateral opacities of the base ABG 7.237/43/68/19/19 P:   Vent support - 8cc/kg  F/u CXR  No weaning until burst suppression accomplished x 48 hours  CARDIOVASCULAR A:  HTN  HFpEF: Echo-G2DD Cocaine abuse Hx w/ positive urine screen this admission P:  Continue telemetry Pressors off since discontinuation of propofol EKG-grossly prolonged QTc  RENAL A:   AKI-improved  Hypomagnesemia-resolved Hyperphosphatemia-resolved Non-gap-hyperchloremic metabolic acidosis improving (Lactate, B-hydroxybutyric acid-WNL's) Random urine Cr 108, Urine Na/K <10/62, urine osmolality 364  P:   F/u chem  Urine chloride and nitrogen pending  Gentle volume for AKI  GASTROINTESTINAL A:   No active  issue  P:   PPI  Tube feeds Nutrition consulted to guide feeding therapy Dulcolax suppository  Colace per tube  HEMATOLOGIC A:   No active issue  Hgb WNL's P:  CBC in am Heparin for DVT ppx  INFECTIOUS A:   No active issue  Initially some concern for aspiration with a leukocytosis P:    Monitor off abx for now  VAP prevention  ENDOCRINE A:   DKA - resolved.  Non-insulin dependent-DM  P:   SSI resistant  Lantus 15 units daily  NEUROLOGIC A:   Encephalopathy Seizure  Status epilepticus  P:   Continuous  EEG monitoring Continue AEDs as per neurology Sedation discontinued, pentobarbital discontinued prior day by neurology  RASS goal: -4  No family at bedside 1/28   Kathi Ludwig, MD Resident PCCM Physician Pager #: [phone]  Attending Note:  62 year old female with PMH of polysubstance abuse who presents to the hospital with seizure and was on pentobarb.  She is completely unresponsive on exam with coarse BS diffusely.  I reviewed CXR myself, ETT is in good position with pulmonary edema.  Hold weaning, lasix as ordered, replace electrolytes, will  await her to wake up prior to serious consideration of extubation.  If not arousable by AM will need to discuss with family options of trach/peg.  The patient is critically ill with multiple organ systems failure and requires high complexity decision making for assessment and support, frequent evaluation and titration of therapies, application of advanced monitoring technologies and extensive interpretation of multiple databases.   Critical Care Time devoted to patient care services described in this note is  35  Minutes. This time reflects time of care of this signee Dr Jennet Maduro. This critical care time does not reflect procedure time, or teaching time or supervisory time of PA/NP/Med student/Med Resident etc but could involve care discussion time.  [name], M.D. [facility] Pulmonary/Critical Care  Medicine. Pager: [phone]. After hours pager: [phone].

## 2017-08-07 NOTE — Progress Notes (Signed)
Reason for consult:   Subjective: No change in exam, EEG shows burst suppression pattern. No longer on pentobarbital.    ROS:  Unable to obtain due to poor mental status  Examination  Vital signs in last 24 hours: Temp:  [96.5 F (35.8 C)-97.6 F (36.4 C)] 96.5 F (35.8 C) (01/29 1128) Pulse Rate:  [80-93] 93 (01/29 1300) Resp:  [18-24] 24 (01/29 1300) BP: (112-148)/(71-85) 142/75 (01/29 1300) SpO2:  [94 %-99 %] 94 % (01/29 1300) FiO2 (%):  [40 %-50 %] 40 % (01/29 1109) Weight:  [93.2 kg (205 lb 7.5 oz)] 93.2 kg (205 lb 7.5 oz) (01/29 0447)  General: Not in distress, cooperative CVS: pulse-normal rate and rhythm RS: breathing comfortably Extremities: normal   Neuro: Intubated, unresponsive Pupils sluggish, no corneal , no gag reflex Does not withdraw in any extremity No spontaneous movements  Basic Metabolic Panel: Recent Labs  Lab 08/03/17 0303  08/03/17 1537 08/04/17 0454 08/05/17 0420 08/06/17 0410 08/07/17 0440  NA 146*   < > 141 139 139 139 145  K 2.6*   < > 3.0* 3.6 4.6 4.3 3.8  CL 115*   < > 112* 110 112* 111 117*  CO2 21*   < > 17* 17* 15* 16* 19*  GLUCOSE 110*   < > 142* 153* 237* 250* 220*  BUN 13   < > 14 15 37* 60* 75*  CREATININE 1.41*   < > 1.76* 2.04* 2.13* 2.38* 1.93*  CALCIUM 8.2*   < > 8.1* 7.7* 7.7* 7.7* 7.9*  MG 1.4*  --  2.5* 2.2  --  2.1 2.2  PHOS 1.6*  --  3.2 5.2*  --  7.0* 4.4   < > = values in this interval not displayed.    CBC: Recent Labs  Lab 08/03/17 0303 08/04/17 0454 08/05/17 0420 08/06/17 0410 08/07/17 0440  WBC 13.9* 11.8* 18.3* 19.3* 16.4*  NEUTROABS 9.9*  --   --   --   --   HGB 12.3 12.2 12.6 12.2 12.0  HCT 38.2 36.7 39.9 38.6 38.5  MCV 96.2 94.6 98.5 97.2 98.7  PLT 269 222 207 227 275     Coagulation Studies: No results for input(s): LABPROT, INR in the last 72 hours.  Imaging Reviewed:     ASSESSMENT AND PLAN  62 year old woman with past history of hyperglycemia, cocaine abuse, seizures  noncompliant with medications seen for concern for status epilepticus initially. Initial spot EEG was negative for seizures but long-term EEG monitoring showed multiple electrographic seizures. Started on Keppra, Dilantin and Vimpat along with Versed and propofol drips. Topamax was added yesterday morning. Continue to require high doses of propofol and Versed drips with continued frequent seizures. She was started on pentobarbital drip - propofol and Versed were discontinued. Pentobarbital stopped last night due to full suppression on EEG, restarted at lower dose this morning due to increase frequency of periodic discharges.    Impression: Super refractory status epilepticus Known history of seizures Prior thalamic hemorrhage and pontine hemorrhage  Cocaine abuse Hyperglycemia  Recommendations: Patient off Pentobarbital, EEG shows burst suppression pattern, no seizures  Continue with Keppra, Vimpat, Dilantin and Topamax at current doses. Continue on LTM EEG. Given the super refractory status, will need spinal tap in the coming days  Plan communicated with the ICU attending in person. We will continue to follow with you.      Karena Addison Aroor [facility] Pager Number [phone] For questions after 7pm please refer to AMION to reach the  Neurologist on call

## 2017-08-07 NOTE — Progress Notes (Signed)
Notified Resident Juleen China and Winf[place] about pt blood gas: pH 7.237, CO2 43.2, pO2 68, bicarb 18.5. FiO2 increased to 50%. Will continue to monitor.

## 2017-08-07 NOTE — Progress Notes (Signed)
EEG LTM maint complete. No skin breakdown. Continue to monitor 

## 2017-08-07 NOTE — Procedures (Signed)
Electroencephalogram report- LTM  Ordering Physician: Dr. Rory Percy     Beginning date and time: 08/06/2017 0730 hours Ending date and time:  08/07/2017 0730 hours  Day of study: 5  Medications include: Pentobarbital  MENTAL STATUS (per technician's notes): Intubated. Sedated. Unresponsive.  HISTORY: This 24 hours of intensive EEG monitoring with simultaneous video monitoring was performed for this patient with unresponsiveness and witnessed convulsive seizures.  Patient is now intubated and sedated.  This EEG was requested to rule out subclinical electrographic seizures.  TECHNICAL DESCRIPTION:  The study consists of a continuous 16-channel multi-montage digital video EEG recording with twenty-one electrodes placed according to the International 10-20 System. Additional leads included eye leads, true temporal leads (T1, T2), and an EKG lead. Activation procedures were not done due to mental status.  REPORT: The background activity consists of diffuse suppression last 20 - 60 seconds with intermittent bursts lasting 1-4 seconds. The bursts were more prominent in the left hemispheric leads. No obvious electrographic seizures were seen.  There were no pushbutton activations events during this recording.   INTERPRETATION: This is an abnormal EEG due to: 1) Burst suppression pattern  Clinical Correlation: This 24 hours of continuous EEG monitoring with simultaneous video monitoring preformed  for this patient who is intubated and sedated is severe diffuse cerebral dysfunction, at least in part due to sedating medications.

## 2017-08-07 NOTE — Progress Notes (Signed)
Inpatient Diabetes Program Recommendations  AACE/ADA: New Consensus Statement on Inpatient Glycemic Control (2015)  Target Ranges:  Prepandial:   less than 140 mg/dL      Peak postprandial:   less than 180 mg/dL (1-2 hours)      Critically ill patients:  140 - 180 mg/dL   Lab Results  Component Value Date   GLUCAP 140 (H) 08/07/2017   HGBA1C 14.6 (H) 05/26/2017    Review of Glycemic Control Results for [name], [name] (MRN [id number]) as of 08/07/2017 09:44  Ref. Range 08/06/2017 15:27 08/06/2017 20:04 08/06/2017 23:43 08/07/2017 04:12 08/07/2017 07:44  Glucose-Capillary Latest Ref Range: 65 - 99 mg/dL 196 (H) 218 (H) 253 (H) 234 (H) 140 (H)   Diabetes history: Type 2 DM Outpatient Diabetes medications: Novolog 70/30 20 Units BIDAC Current orders for Inpatient glycemic control: Lantus 15 Units QD, Novolog 0-20 Units Q4H  Inpatient Diabetes Program Recommendations:    Please consider Novolog 3 Units Q4H for tube feed coverage. Patient may need to have insulin adjusted if tube feeds decrease.   Thanks, Bronson Curb, MSN, RNC-OB Diabetes Coordinator [phone] (8a-5p)

## 2017-08-07 NOTE — Progress Notes (Signed)
Wasted 50cc of pentobarbital in sink. Witnessed by Lauretta Grill RN.

## 2017-08-08 ENCOUNTER — Inpatient Hospital Stay (HOSPITAL_COMMUNITY): Payer: Medicaid Other

## 2017-08-08 LAB — CBC WITH DIFFERENTIAL/PLATELET
BASOS PCT: 0 %
Band Neutrophils: 0 %
Basophils Absolute: 0 10*3/uL (ref 0.0–0.1)
Blasts: 0 %
EOS ABS: 0 10*3/uL (ref 0.0–0.7)
Eosinophils Relative: 0 %
HCT: 39.3 % (ref 36.0–46.0)
Hemoglobin: 12.2 g/dL (ref 12.0–15.0)
LYMPHS ABS: 2.6 10*3/uL (ref 0.7–4.0)
Lymphocytes Relative: 14 %
MCH: 31 pg (ref 26.0–34.0)
MCHC: 31 g/dL (ref 30.0–36.0)
MCV: 99.7 fL (ref 78.0–100.0)
METAMYELOCYTES PCT: 0 %
MONO ABS: 1.5 10*3/uL — AB (ref 0.1–1.0)
MYELOCYTES: 0 %
Monocytes Relative: 8 %
Neutro Abs: 14.7 10*3/uL — ABNORMAL HIGH (ref 1.7–7.7)
Neutrophils Relative %: 78 %
Other: 0 %
PLATELETS: 321 10*3/uL (ref 150–400)
Promyelocytes Absolute: 0 %
RBC: 3.94 MIL/uL (ref 3.87–5.11)
RDW: 15.7 % — ABNORMAL HIGH (ref 11.5–15.5)
WBC MORPHOLOGY: INCREASED
WBC: 18.8 10*3/uL — AB (ref 4.0–10.5)
nRBC: 0 /100 WBC

## 2017-08-08 LAB — POCT I-STAT 3, ART BLOOD GAS (G3+)
ACID-BASE DEFICIT: 7 mmol/L — AB (ref 0.0–2.0)
BICARBONATE: 18.8 mmol/L — AB (ref 20.0–28.0)
O2 Saturation: 90 %
TCO2: 20 mmol/L — AB (ref 22–32)
pCO2 arterial: 39 mmHg (ref 32.0–48.0)
pH, Arterial: 7.293 — ABNORMAL LOW (ref 7.350–7.450)
pO2, Arterial: 66 mmHg — ABNORMAL LOW (ref 83.0–108.0)

## 2017-08-08 LAB — CBC
HCT: 37.6 % (ref 36.0–46.0)
HEMOGLOBIN: 12.1 g/dL (ref 12.0–15.0)
MCH: 31.2 pg (ref 26.0–34.0)
MCHC: 32.2 g/dL (ref 30.0–36.0)
MCV: 96.9 fL (ref 78.0–100.0)
PLATELETS: 307 10*3/uL (ref 150–400)
RBC: 3.88 MIL/uL (ref 3.87–5.11)
RDW: 15.2 % (ref 11.5–15.5)
WBC: 20 10*3/uL — ABNORMAL HIGH (ref 4.0–10.5)

## 2017-08-08 LAB — COMPREHENSIVE METABOLIC PANEL
ALK PHOS: 67 U/L (ref 38–126)
ALT: 12 U/L — AB (ref 14–54)
ANION GAP: 10 (ref 5–15)
AST: 24 U/L (ref 15–41)
Albumin: 1.6 g/dL — ABNORMAL LOW (ref 3.5–5.0)
BILIRUBIN TOTAL: 0.5 mg/dL (ref 0.3–1.2)
BUN: 81 mg/dL — ABNORMAL HIGH (ref 6–20)
CALCIUM: 8.5 mg/dL — AB (ref 8.9–10.3)
CO2: 19 mmol/L — ABNORMAL LOW (ref 22–32)
CREATININE: 1.6 mg/dL — AB (ref 0.44–1.00)
Chloride: 120 mmol/L — ABNORMAL HIGH (ref 101–111)
GFR calc non Af Amer: 34 mL/min — ABNORMAL LOW (ref >60)
GFR, EST AFRICAN AMERICAN: 39 mL/min — AB (ref >60)
Glucose, Bld: 238 mg/dL — ABNORMAL HIGH (ref 65–99)
Potassium: 4 mmol/L (ref 3.5–5.1)
Sodium: 149 mmol/L — ABNORMAL HIGH (ref 135–145)
TOTAL PROTEIN: 6.7 g/dL (ref 6.5–8.1)

## 2017-08-08 LAB — MAGNESIUM: MAGNESIUM: 2 mg/dL (ref 1.7–2.4)

## 2017-08-08 LAB — GLUCOSE, CAPILLARY
GLUCOSE-CAPILLARY: 306 mg/dL — AB (ref 65–99)
Glucose-Capillary: 178 mg/dL — ABNORMAL HIGH (ref 65–99)
Glucose-Capillary: 233 mg/dL — ABNORMAL HIGH (ref 65–99)
Glucose-Capillary: 288 mg/dL — ABNORMAL HIGH (ref 65–99)
Glucose-Capillary: 295 mg/dL — ABNORMAL HIGH (ref 65–99)
Glucose-Capillary: 365 mg/dL — ABNORMAL HIGH (ref 65–99)

## 2017-08-08 LAB — PHOSPHORUS: PHOSPHORUS: 2.1 mg/dL — AB (ref 2.5–4.6)

## 2017-08-08 LAB — PHENYTOIN LEVEL, TOTAL: Phenytoin Lvl: 11.2 ug/mL (ref 10.0–20.0)

## 2017-08-08 LAB — PROCALCITONIN: Procalcitonin: 1.02 ng/mL

## 2017-08-08 MED ORDER — POTASSIUM PHOSPHATES 15 MMOLE/5ML IV SOLN
30.0000 mmol | Freq: Once | INTRAVENOUS | Status: AC
  Administered 2017-08-08: 30 mmol via INTRAVENOUS
  Filled 2017-08-08: qty 10

## 2017-08-08 MED ORDER — VANCOMYCIN HCL IN DEXTROSE 750-5 MG/150ML-% IV SOLN
750.0000 mg | Freq: Two times a day (BID) | INTRAVENOUS | Status: DC
  Administered 2017-08-08 – 2017-08-09 (×3): 750 mg via INTRAVENOUS
  Filled 2017-08-08 (×4): qty 150

## 2017-08-08 MED ORDER — INSULIN GLARGINE 100 UNIT/ML ~~LOC~~ SOLN
20.0000 [IU] | Freq: Every day | SUBCUTANEOUS | Status: DC
  Filled 2017-08-08: qty 0.2

## 2017-08-08 MED ORDER — INSULIN ASPART 100 UNIT/ML ~~LOC~~ SOLN
6.0000 [IU] | SUBCUTANEOUS | Status: DC
  Administered 2017-08-08 – 2017-08-20 (×58): 6 [IU] via SUBCUTANEOUS

## 2017-08-08 MED ORDER — PHENYTOIN SODIUM 50 MG/ML IJ SOLN
100.0000 mg | Freq: Two times a day (BID) | INTRAMUSCULAR | Status: DC
  Administered 2017-08-08 – 2017-08-13 (×11): 100 mg via INTRAVENOUS
  Filled 2017-08-08 (×11): qty 2

## 2017-08-08 MED ORDER — FUROSEMIDE 10 MG/ML IJ SOLN
40.0000 mg | Freq: Three times a day (TID) | INTRAMUSCULAR | Status: AC
  Administered 2017-08-08 (×2): 40 mg via INTRAVENOUS
  Filled 2017-08-08 (×2): qty 4

## 2017-08-08 MED ORDER — INSULIN GLARGINE 100 UNIT/ML ~~LOC~~ SOLN
22.0000 [IU] | Freq: Every day | SUBCUTANEOUS | Status: DC
  Administered 2017-08-08: 22 [IU] via SUBCUTANEOUS
  Filled 2017-08-08: qty 0.22

## 2017-08-08 MED ORDER — VANCOMYCIN HCL 10 G IV SOLR
2000.0000 mg | Freq: Once | INTRAVENOUS | Status: AC
  Administered 2017-08-08: 2000 mg via INTRAVENOUS
  Filled 2017-08-08: qty 2000

## 2017-08-08 MED ORDER — PRO-STAT SUGAR FREE PO LIQD
30.0000 mL | Freq: Two times a day (BID) | ORAL | Status: DC
  Administered 2017-08-08 – 2017-09-04 (×53): 30 mL
  Filled 2017-08-08 (×54): qty 30

## 2017-08-08 MED ORDER — LABETALOL HCL 5 MG/ML IV SOLN
5.0000 mg | INTRAVENOUS | Status: DC | PRN
  Administered 2017-08-08 – 2017-08-16 (×28): 5 mg via INTRAVENOUS
  Filled 2017-08-08 (×12): qty 4

## 2017-08-08 MED ORDER — PHENYTOIN SODIUM 50 MG/ML IJ SOLN: 100.0000 mg | Freq: Two times a day (BID) | INTRAMUSCULAR | Status: DC

## 2017-08-08 MED ORDER — PIPERACILLIN-TAZOBACTAM 3.375 G IVPB
3.3750 g | Freq: Three times a day (TID) | INTRAVENOUS | Status: DC
  Administered 2017-08-08 – 2017-08-14 (×17): 3.375 g via INTRAVENOUS
  Filled 2017-08-08 (×19): qty 50

## 2017-08-08 MED ORDER — FREE WATER
250.0000 mL | Status: DC
  Administered 2017-08-08 – 2017-08-13 (×31): 250 mL

## 2017-08-08 MED ORDER — FREE WATER
400.0000 mL | Freq: Three times a day (TID) | Status: DC
  Administered 2017-08-08: 400 mL

## 2017-08-08 MED ORDER — AMLODIPINE BESYLATE 2.5 MG PO TABS
2.5000 mg | ORAL_TABLET | Freq: Every day | ORAL | Status: DC
  Administered 2017-08-08 – 2017-08-09 (×2): 2.5 mg
  Filled 2017-08-08 (×2): qty 1

## 2017-08-08 MED ORDER — AMLODIPINE 1 MG/ML ORAL SUSPENSION: 2.5000 mg | Freq: Every day | ORAL | Status: DC

## 2017-08-08 MED ORDER — INSULIN GLARGINE 100 UNIT/ML ~~LOC~~ SOLN
25.0000 [IU] | Freq: Every day | SUBCUTANEOUS | Status: DC
  Administered 2017-08-09 – 2017-08-19 (×10): 25 [IU] via SUBCUTANEOUS
  Filled 2017-08-08 (×12): qty 0.25

## 2017-08-08 MED ORDER — INSULIN ASPART 100 UNIT/ML ~~LOC~~ SOLN
3.0000 [IU] | SUBCUTANEOUS | Status: DC
  Administered 2017-08-08 (×2): 3 [IU] via SUBCUTANEOUS

## 2017-08-08 NOTE — Progress Notes (Signed)
Nutrition Consult / Follow-up  DOCUMENTATION CODES:   Morbid obesity  INTERVENTION:    Vital High Protein at 40 ml/h (960 ml per day)  Pro-stat 30 ml BID  Provides 1160 kcal, 114 gm protein, 803 ml free water daily  NUTRITION DIAGNOSIS:   Inadequate oral intake related to inability to eat as evidenced by NPO status.  Ongoing  GOAL:   Provide needs based on ASPEN/SCCM guidelines  Met with TF  MONITOR:   Vent status, TF tolerance, Labs, I & O's  REASON FOR ASSESSMENT:   Consult Enteral/tube feeding initiation and management  ASSESSMENT:   62 yo female with PMH of DM, HTN, alcohol and cocaine abuse, L thalamic hemorrhage, insomnia disorder who was admitted on 1/24 after being found down; clinical seizures in the ED; required intubation in ED.  Discussed patient in ICU rounds and with RN today. Propofol is now off. TF rate was increased to 40 ml/h. Received MD Consult for TF adjustment. Patient is currently intubated on ventilator support MV: 10 L/min Temp (24hrs), Avg:100.1 F (37.8 C), Min:98 F (36.7 C), Max:102.5 F (39.2 C)  Propofol: off Labs reviewed. Sodium 149 (H), phosphorus 2.1 (L) CBG's: [phone] Medications reviewed and include Colace, Lasix. Receiving 250 ml free water every 4 hours via OGT.   Diet Order:  Diet NPO time specified Seizure precautions  EDUCATION NEEDS:   No education needs have been identified at this time  Skin:  Skin Assessment: Skin Integrity Issues: Skin Integrity Issues:: Stage II, Diabetic Ulcer Stage II: coccyx Diabetic Ulcer: R leg  Last BM:  1/30  Height:   Ht Readings from Last 1 Encounters:  08/02/17 '5\' 5"'$  (1.651 m)    Weight:   Wt Readings from Last 1 Encounters:  08/08/17 202 lb 2.6 oz (91.7 kg)    Ideal Body Weight:  56.8 kg  BMI:  Body mass index is 33.64 kg/m.  Estimated Nutritional Needs:   Kcal:  [phone]  Protein:  114 gm  Fluid:  >1.5 L   Molli Barrows, RD, LDN, [place] Pager  [phone] After Hours Pager [phone]

## 2017-08-08 NOTE — Progress Notes (Signed)
PULMONARY / CRITICAL CARE MEDICINE   Name: [name] MRN: [id number] DOB: Oct 26, 1955    ADMISSION DATE:  08/02/2017  CHIEF COMPLAINT: Found down  HISTORY OF PRESENT ILLNESS:        This is a 62 year old diabetic with a history of alcohol and cocaine abuse who is previously been admitted to this hospital for a left thalamic hemorrhage.  She was found down the day of admission having lost bowel and bladder control.  She was brought to the [facility] ED where she was observed to have overt clinical seizure activity.  She was treated with Keppra fosphenytoin and Ativan with subsequent cessation of the clinical seizures. Seizure activity continued subclinically despite high dose propofol and versed. This was discontinued and replaced with pentobarbital for a goal of >48 hours without burst activity as per EEG.    SUBJECTIVE:  Pentobarbital discontinued as per Neurology.   VITAL SIGNS: BP (!) 161/74   Pulse (!) 114   Temp 99.6 F (37.6 C) (Oral)   Resp (!) 26   Ht 5\' 5"  (1.651 m)   Wt 202 lb 2.6 oz (91.7 kg)   SpO2 93%   BMI 33.64 kg/m   HEMODYNAMICS:   VENTILATOR SETTINGS: Vent Mode: PRVC FiO2 (%):  [40 %] 40 % Set Rate:  [18 bmp-24 bmp] 24 bmp Vt Set:  [500 mL] 500 mL PEEP:  [5 cmH20] 5 cmH20 Plateau Pressure:  [25 cmH20-29 cmH20] 26 cmH20  INTAKE / OUTPUT: I/O last 3 completed shifts: In: 2559.9 [I.V.:269.9; NG/GT:1840; IV Piggyback:450] Out: 4174 [YCXKG:8185]  PHYSICAL EXAMINATION: General:  Chronically ill appearing female, remains heavily sedated Neuro: RASS -4 due to chemically induced coma, no response to commands or sternal rub HEENT: MM pink/moist, pupils equal unreactive to light with stable opacity of the lens bilaterally  CV: s1s2 RRR, no m/r/g auscultated PULM: Crackles bilaterally, significantly more prominent in the right, no wheezing noted. Breath sounds adequate GI: soft, non-tender, bowel sounds active Extremities: warm/dry, mild mild pedal edema  bilaterally, improving Skin: no rashes or lesions  LABS:  BMET Recent Labs  Lab 08/06/17 0410 08/07/17 0440 08/08/17 0342  NA 139 145 149*  K 4.3 3.8 4.0  CL 111 117* 120*  CO2 16* 19* 19*  BUN 60* 75* 81*  CREATININE 2.38* 1.93* 1.60*  GLUCOSE 250* 220* 238*   Electrolytes Recent Labs  Lab 08/06/17 0410 08/07/17 0440 08/08/17 0342  CALCIUM 7.7* 7.9* 8.5*  MG 2.1 2.2 2.0  PHOS 7.0* 4.4 2.1*   CBC Recent Labs  Lab 08/06/17 0410 08/07/17 0440 08/08/17 0342  WBC 19.3* 16.4* 20.0*  HGB 12.2 12.0 12.1  HCT 38.6 38.5 37.6  PLT 227 275 307   Coag's No results for input(s): APTT, INR in the last 168 hours.  Sepsis Markers Recent Labs  Lab 08/02/17 1404 08/02/17 1748 08/05/17 1237  LATICACIDVEN 2.23* 2.96* 1.8   ABG Recent Labs  Lab 08/06/17 0958 08/07/17 0430 08/08/17 0329  PHART 7.265* 7.237* 7.293*  PCO2ART 38.5 43.2 39.0  PO2ART 70.2* 68.0* 66.0*   Liver Enzymes Recent Labs  Lab 08/04/17 0454 08/07/17 0726 08/08/17 0342  AST 18 30 24   ALT 9* 14 12*  ALKPHOS 37* 62 67  BILITOT 0.5 0.2* 0.5  ALBUMIN 2.0* 1.8* 1.6*   Cardiac Enzymes No results for input(s): TROPONINI, PROBNP in the last 168 hours.  Glucose Recent Labs  Lab 08/07/17 0744 08/07/17 1127 08/07/17 1552 08/07/17 1936 08/07/17 2332 08/08/17 0329  GLUCAP 140* 240* 229* 210* 280*  178*   Imaging No results found.   STUDIES:  EEG 1/24>>>This is an abnormal EEG.  There is evidence of moderate generalized slowing of brain activity as well as severe focal slowing in the left temporal lobe.  The patient is not in non-convulsive status epilepticus. 2D echo 1/25>>> EF 84-16%, grade 2 diastolic dysfunction  MRI brain 1/25>>> 1. No acute intracranial abnormality. 2. 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 3. Remote hemorrhage in the left thalamus and left pons. 4. Age advanced atrophy. 5. Fluid in the nasopharynx and paranasal sinuses is likely secondary to  intubation.  ANTIBIOTICS: N/A  LINES/TUBES: CVC 1/24>>> ETT 1/24>>> NG/OG 1/24>>>  DISCUSSION: 16 yoF with a history of polysubstance abuse who presented after being found down to the ED whereupon she was observed to be in status epilepticus. She was loaded with anticonvulsants and intubated for airway protection. In addition she was found to be hyperglycemic with borderline anion gap. Patient remains unresponsive despite discontinuation of sedation. This is to be expected considering that she was placed on a fat soluble barbiturate for burst suppression.  ASSESSMENT / PLAN:   PULMONARY A: Acute respiratory failure 2/2 acute encephalopathy and seizure CXR-atelectasis slightly worse in RUL, bilateral opacities persist in the lower lung fields ABG 7.293/39/66/19-slightly improved  P:   Vent support - 8cc/kg  F/u CXR  Will wean when alert  CARDIOVASCULAR A:  HTN  HFpEF: Echo-G2DD Cocaine abuse Hx w/ positive urine screen this admission P:  Continue telemetry Pressors off since discontinuation of propofol EKG-grossly prolonged QTc Labetalol PRN for HTN Amlodipine 2.5mg  daily per tube   RENAL A:   AKI-improved  Hypomagnesemia-resolved Hyperphosphatemia-resolved Hyperchloremic hypernatremia Non-gap-hyperchloremic metabolic acidosis improving (Lactate, B-hydroxybutyric acid-WNL's) Random urine Cr 108, Urine Na/K <10/62, urine osmolality 364  P:   F/u chem  Urine chloride and nitrogen pending  Gentle volume for AKI Free water flushes at 430ml Q8hours with Lasix 40mg IV x2 doses  GASTROINTESTINAL A:   No active issue  P:   PPI for GI ppx Tube feeds Nutrition consulted to guide feeding therapy Dulcolax suppository  Colace per tube  HEMATOLOGIC A:   No active issue  Hgb WNL's P:  CBC in am Heparin for DVT ppx  INFECTIOUS A:   No active issue  Initially some concern for aspiration with a leukocytosis P:    Monitor off abx for now  VAP  prevention  ENDOCRINE A:   DKA - resolved.  Non-insulin dependent-DM  P:   SSI resistant  Lantus 20 units daily  NEUROLOGIC A:   Encephalopathy Seizure  Status epilepticus  P:   Continuous  EEG monitoring Continue AEDs as per neurology Sedation discontinued, pentobarbital discontinued prior day by neurology  RASS goal: -4  No family at bedside 1/29   Kathi Ludwig, MD Resident PCCM Physician Pager #: [phone]  Attending Note:  62 year old female with PMH of polysubstance abuse presenting with status epilepticus that is now febrile with elevated WBC.  On exam, coarse BS diffusely.  I reviewed CXR myself, RLL infiltrate worsening with ETT in good position.  Will begin PS trials.  Start broad spectrum abx.  Pan culture.  Hold of extubation until patient is more neurologically responsive.  Continue full vent.  PCCM will continue to follow.  The patient is critically ill with multiple organ systems failure and requires high complexity decision making for assessment and support, frequent evaluation and titration of therapies, application of advanced monitoring technologies and extensive interpretation of multiple  databases.   Critical Care Time devoted to patient care services described in this note is  35  Minutes. This time reflects time of care of this signee Dr Jennet Maduro. This critical care time does not reflect procedure time, or teaching time or supervisory time of PA/NP/Med student/Med Resident etc but could involve care discussion time.  Rush Farmer, M.D. [facility] Pulmonary/Critical Care Medicine. Pager: [phone]. After hours pager: [phone].

## 2017-08-08 NOTE — Progress Notes (Signed)
Reason for consult:   Subjective: No change in exam, comatose and intubated    ROS: negative except above Unable to obtain due to poor mental status  Examination  Vital signs in last 24 hours: Temp:  [98 F (36.7 C)-102.5 F (39.2 C)] 101.6 F (38.7 C) (01/30 1140) Pulse Rate:  [96-120] 101 (01/30 1500) Resp:  [20-33] 29 (01/30 1500) BP: (139-168)/(67-84) 145/80 (01/30 1500) SpO2:  [89 %-99 %] 90 % (01/30 1500) FiO2 (%):  [40 %] 40 % (01/30 1131) Weight:  [91.7 kg (202 lb 2.6 oz)] 91.7 kg (202 lb 2.6 oz) (01/30 0353)  General: intubated and sedated  CVS: pulse-normal rate and rhythm RS: breathing comfortably on ventilator Extremities: normal   Neuro: Intubated, unresponsive Pupils sluggish, no corneal , no gag reflex Does not withdraw in any extremity 1+ reflex on left biceps, absent patellar refelxes Plantars mute  No spontaneous movements    Basic Metabolic Panel: Recent Labs  Lab 08/03/17 1537 08/04/17 0454 08/05/17 0420 08/06/17 0410 08/07/17 0440 08/08/17 0342  NA 141 139 139 139 145 149*  K 3.0* 3.6 4.6 4.3 3.8 4.0  CL 112* 110 112* 111 117* 120*  CO2 17* 17* 15* 16* 19* 19*  GLUCOSE 142* 153* 237* 250* 220* 238*  BUN 14 15 37* 60* 75* 81*  CREATININE 1.76* 2.04* 2.13* 2.38* 1.93* 1.60*  CALCIUM 8.1* 7.7* 7.7* 7.7* 7.9* 8.5*  MG 2.5* 2.2  --  2.1 2.2 2.0  PHOS 3.2 5.2*  --  7.0* 4.4 2.1*    CBC: Recent Labs  Lab 08/03/17 0303  08/05/17 0420 08/06/17 0410 08/07/17 0440 08/08/17 0342 08/08/17 0952  WBC 13.9*   < > 18.3* 19.3* 16.4* 20.0* 18.8*  NEUTROABS 9.9*  --   --   --   --   --  14.7*  HGB 12.3   < > 12.6 12.2 12.0 12.1 12.2  HCT 38.2   < > 39.9 38.6 38.5 37.6 39.3  MCV 96.2   < > 98.5 97.2 98.7 96.9 99.7  PLT 269   < > 207 227 275 307 321   < > = values in this interval not displayed.     Coagulation Studies: No results for input(s): LABPROT, INR in the last 72 hours.  Imaging Reviewed:     ASSESSMENT AND  PLAN  Impression: Super refractory status epilepticus Known history of seizures Prior thalamic hemorrhage and pontine hemorrhage  Cocaine abuse Hyperglycemia  Recommendations: Patient off Pentobarbital, EEG shows burst suppression pattern however burst more frequent, sharp discharges seen in left hemisphere , no seizures  Continue on LTM EEG. Continue with Keppra, Vimpat, Dilantin and Topamax at current doses. We will continue to follow with you.   Karena Addison Austin Pongratz [facility] Pager Number [phone] For questions after 7pm please refer to AMION to reach the Neurologist on call

## 2017-08-08 NOTE — Procedures (Signed)
Electroencephalogram report- LTM  Ordering Physician: Dr. Rory Percy     Beginning date and time: [phone] hours Ending date and time:  08/08/2017 0730 hours  Day of study: 6  Medications include: Pentobarbital  MENTAL STATUS (per technician's notes): Intubated. Sedated. Unresponsive.  HISTORY: This 24 hours of intensive EEG monitoring with simultaneous video monitoring was performed for this patient with unresponsiveness and witnessed convulsive seizures.  Patient is now intubated and sedated.  This EEG was requested to rule out subclinical electrographic seizures.  TECHNICAL DESCRIPTION:  The study consists of a continuous 16-channel multi-montage digital video EEG recording with twenty-one electrodes placed according to the International 10-20 System. Additional leads included eye leads, true temporal leads (T1, T2), and an EKG lead. Activation procedures were not done due to mental status.  REPORT: The background activity consists of diffuse suppression thats last 2 - 10 seconds with intermittent bursts lasting 1-6seconds. The bursts were more prominent in the left hemispheric leads. No obvious electrographic seizures were seen.  There were no pushbutton activations events during this recording.   INTERPRETATION: This is an abnormal EEG due to: 1) Burst suppression pattern  Clinical Correlation: The EEG continues to show burst suppression pattern, though the duration suppression is decreased as compared to the previous days recording.

## 2017-08-08 NOTE — Consult Note (Signed)
[facility] Nurse wound consult note Reason for Consult:THird consult for breakdown to sacrum/buttocks (MASD) and full thickness wound to right anterior lower leg.  Bilateral stage 2 heel wounds.  Wound type:moisture and pressure Pressure Injury POA: Yes Measurement: sacrum:  5 cm x 2.5 cm x 0.2 cm no change since last assessment Right lower leg: 4 cm x 2 cm wound bed is devitalized tissue.  Unchanged Bilateral heels with 2 cm x 1 cm nonblanchable erythema with silicone foam and PRevalon offloading boot in place. Dressing procedure/placement/frequency:No changes to plan of care for wounds.  Albumin is 2.0 and impaired ability to heal is certain.  Offload pressure by turning every two hours. \ Will not follow at this time.  Please re-consult if needed.  Domenic Moras RN BSN [place] Pager [phone]

## 2017-08-08 NOTE — Progress Notes (Signed)
vLTM EEG maint complete. Moved FP1 and FP2 on forehead to prevent any further skin breakdown. Will continue to monitor

## 2017-08-08 NOTE — Progress Notes (Signed)
MEDICATION RELATED CONSULT NOTE - INITIAL   Pharmacy Consult for phenytoin Indication: Seizures  No Known Allergies  Patient Measurements: Height: 5\' 5"  (165.1 cm) Weight: 202 lb 2.6 oz (91.7 kg) IBW/kg (Calculated) : 57  Vital Signs: Temp: 102.5 F (39.2 C) (01/30 0735) Temp Source: Oral (01/30 0735) BP: 165/75 (01/30 0800) Pulse Rate: 119 (01/30 0800) Intake/Output from previous day: 01/29 0701 - 01/30 0700 In: 1400 [I.V.:140; NG/GT:960; IV Piggyback:300] Out: 3425 [Urine:3425] Intake/Output from this shift: Total I/O In: -  Out: 75 [Urine:75]  Labs: Recent Labs    08/05/17 1209 08/06/17 0410 08/07/17 0440 08/07/17 0726 08/08/17 0342  WBC  --  19.3* 16.4*  --  20.0*  HGB  --  12.2 12.0  --  12.1  HCT  --  38.6 38.5  --  37.6  PLT  --  227 275  --  307  CREATININE  --  2.38* 1.93*  --  1.60*  LABCREA 107.90  --   --   --   --   MG  --  2.1 2.2  --  2.0  PHOS  --  7.0* 4.4  --  2.1*  ALBUMIN  --   --   --  1.8* 1.6*  PROT  --   --   --  6.7 6.7  AST  --   --   --  30 24  ALT  --   --   --  14 12*  ALKPHOS  --   --   --  62 67  BILITOT  --   --   --  0.2* 0.5  BILIDIR  --   --   --  0.1  --   IBILI  --   --   --  0.1*  --    Estimated Creatinine Clearance: 41.9 mL/min (A) (by C-G formula based on SCr of 1.6 mg/dL (H)).   Microbiology: Recent Results (from the past 720 hour(s))  MRSA PCR Screening     Status: None   Collection Time: 08/02/17 11:00 PM  Result Value Ref Range Status   MRSA by PCR NEGATIVE NEGATIVE Final    Comment:        The GeneXpert MRSA Assay (FDA approved for NASAL specimens only), is one component of a comprehensive MRSA colonization surveillance program. It is not intended to diagnose MRSA infection nor to guide or monitor treatment for MRSA infections.     Medical History: Past Medical History:  Diagnosis Date  . Cocaine abuse, continuous ([place])   . Diabetes mellitus    Hb A1C = 12.6 on 05/15/11, managed on Novolog  70/30, 35 U qam, 25 U qpm  . Hypertension    poorly controlled  . Insomnia disorder   . Shortness of breath     Medications:  Medications Prior to Admission  Medication Sig Dispense Refill Last Dose  . amLODipine (NORVASC) 10 MG tablet Take 1 tablet (10 mg total) daily by mouth. 30 tablet 1 LF 07-30-17 at 30DS  . collagenase (SANTYL) ointment Apply topically daily. Apply Santyl to right leg wound Q day, then cover with moist gauze and dry gauze and kerlex 15 g 0 LF 07-19-17 at 30DS  . furosemide (LASIX) 20 MG tablet Take 20 mg by mouth daily.  2 LF 07-31-17 at 30DS  . gabapentin (NEURONTIN) 300 MG capsule Take 1 capsule (300 mg total) 3 (three) times daily by mouth. 90 capsule 1 LF 07-31-17 at 30DS  . insulin aspart protamine- aspart (  NOVOLOG MIX 70/30) (70-30) 100 UNIT/ML injection Inject 0.2 mLs (20 Units total) into the skin 2 (two) times daily with a meal.   LF 07-31-17 at 30DS  . levETIRAcetam (KEPPRA) 500 MG tablet Take 1 tablet (500 mg total) 2 (two) times daily by mouth. 60 tablet 1 LF 05-16-17 at 30DS  . metoprolol tartrate (LOPRESSOR) 25 MG tablet Take 25 mg by mouth 2 (two) times daily.  0 LF 07-30-17 at 30DS  . acetaminophen (TYLENOL) 325 MG tablet Take 2 tablets (650 mg total) every 6 (six) hours as needed by mouth for mild pain, fever or headache. 40 tablet 0 UNK  . atorvastatin (LIPITOR) 40 MG tablet Take 1 tablet (40 mg total) daily at 6 PM by mouth. 30 tablet 1 LF 05-16-17 at 30DS  . cyclobenzaprine (FLEXERIL) 10 MG tablet Take 1 tablet (10 mg total) at bedtime by mouth. 30 tablet 0 LF 05-16-17 at 30DS  . folic acid (FOLVITE) 1 MG tablet Take 1 tablet (1 mg total) by mouth daily. 30 tablet 0 LF 06-04-17 at 30DS  . Multiple Vitamin (MULTIVITAMIN WITH MINERALS) TABS tablet Take 1 tablet by mouth daily.   UNK  . nystatin cream (MYCOSTATIN) Apply topically 2 (two) times daily. 30 g 0 LF 06-04-17 at 14DS  . oxybutynin (DITROPAN-XL) 5 MG 24 hr tablet Take 1 tablet (5 mg total) at bedtime by  mouth. 30 tablet 0 05-16-17 at 30DS  . pantoprazole (PROTONIX) 40 MG tablet Take 1 tablet (40 mg total) by mouth daily.   LF 05-16-17 at 30DS  . PROAIR HFA 108 (90 Base) MCG/ACT inhaler Inhale 2 puffs into the lungs 4 (four) times daily as needed for wheezing.  0 LF 10-08-16 at PRN  . thiamine 100 MG tablet Take 1 tablet (100 mg total) by mouth daily. 30 tablet 0 LF 06-04-17 at 30DS    Assessment: Pharmacy helping with phenytoin dosing in collaboration with neurology. A phenytoin trough drawn yesterday was supratherapeutic at 18.3 (adjusts to ~30). Discussed with neurology who agreed to hold last night's dose and draw random AM level. Level this AM is 11.2 (Adjusts to ~ 20).   Goal of Therapy:  Phenytoin level 10-20 ug/mL  Plan:  Decrease to phenytoin 100 mg BID per discussion with neurology. First dose to start at 1200 today Repeat phenytoin level at steady state  Albertina Parr, PharmD., BCPS Clinical Pharmacist Pager [phone]

## 2017-08-08 NOTE — Progress Notes (Signed)
Pharmacy Antibiotic Note  [name] is a 62 y.o. female admitted on 08/02/2017 with seizure activity, now on several antiepileptics. No antibiotics started at admission. Pharmacy has been consulted for vancomycin and Zosyn dosing for a suspected pneumonia.   WBC has remained elevated during admission and was 20.0 today. Pt became febrile with recent temp 102.5. Calc. CrCl is 43, and renal function has been improving.    Plan: Zosyn 3.375g Q8H Load Vancomycin 2000mg  IV x1, then Vancomycin 750mg  IV Q12H Goal vanc trough 15-20 Monitor renal function, clinical status, Vanc level at SS Follow cultures  Height: 5\' 5"  (165.1 cm) Weight: 202 lb 2.6 oz (91.7 kg) IBW/kg (Calculated) : 57  Temp (24hrs), Avg:99.3 F (37.4 C), Min:96.5 F (35.8 C), Max:102.5 F (39.2 C)  Recent Labs  Lab 08/02/17 1404  08/02/17 1748  08/04/17 0454 08/05/17 0420 08/05/17 1237 08/06/17 0410 08/07/17 0440 08/08/17 0342  WBC  --   --   --    < > 11.8* 18.3*  --  19.3* 16.4* 20.0*  CREATININE  --    < >  --    < > 2.04* 2.13*  --  2.38* 1.93* 1.60*  LATICACIDVEN 2.23*  --  2.96*  --   --   --  1.8  --   --   --    < > = values in this interval not displayed.    Estimated Creatinine Clearance: 41.9 mL/min (A) (by C-G formula based on SCr of 1.6 mg/dL (H)).    No Known Allergies  Antimicrobials this admission: Vancomycin 1/30>>  Zosyn 1/30 >>   Microbiology results: 1/30 BCx: Pending 1/30 Sputum: Pending 1/24 MRSA PCR: Negative  Thank you for allowing pharmacy to be a part of this patient's care.  Karmen Stabs, PharmD Candidate 08/08/2017 10:19 AM

## 2017-08-09 ENCOUNTER — Inpatient Hospital Stay (HOSPITAL_COMMUNITY): Payer: Medicaid Other

## 2017-08-09 LAB — GLUCOSE, CAPILLARY
GLUCOSE-CAPILLARY: 157 mg/dL — AB (ref 65–99)
GLUCOSE-CAPILLARY: 202 mg/dL — AB (ref 65–99)
GLUCOSE-CAPILLARY: 139 mg/dL — AB (ref 65–99)
GLUCOSE-CAPILLARY: 209 mg/dL — AB (ref 65–99)
Glucose-Capillary: 157 mg/dL — ABNORMAL HIGH (ref 65–99)
Glucose-Capillary: 118 mg/dL — ABNORMAL HIGH (ref 65–99)

## 2017-08-09 LAB — BASIC METABOLIC PANEL
ANION GAP: 12 (ref 5–15)
BUN: 94 mg/dL — ABNORMAL HIGH (ref 6–20)
CO2: 19 mmol/L — ABNORMAL LOW (ref 22–32)
CREATININE: 1.67 mg/dL — AB (ref 0.44–1.00)
Calcium: 8.2 mg/dL — ABNORMAL LOW (ref 8.9–10.3)
Chloride: 118 mmol/L — ABNORMAL HIGH (ref 101–111)
GFR calc non Af Amer: 32 mL/min — ABNORMAL LOW (ref >60)
GFR, EST AFRICAN AMERICAN: 37 mL/min — AB (ref >60)
Glucose, Bld: 187 mg/dL — ABNORMAL HIGH (ref 65–99)
POTASSIUM: 3.9 mmol/L (ref 3.5–5.1)
SODIUM: 149 mmol/L — AB (ref 135–145)

## 2017-08-09 LAB — CBC
HCT: 42.2 % (ref 36.0–46.0)
HEMOGLOBIN: 13.2 g/dL (ref 12.0–15.0)
MCH: 30.7 pg (ref 26.0–34.0)
MCHC: 31.3 g/dL (ref 30.0–36.0)
MCV: 98.1 fL (ref 78.0–100.0)
Platelets: 251 10*3/uL (ref 150–400)
RBC: 4.3 MIL/uL (ref 3.87–5.11)
RDW: 16.1 % — ABNORMAL HIGH (ref 11.5–15.5)
WBC: 14.5 10*3/uL — AB (ref 4.0–10.5)

## 2017-08-09 LAB — MAGNESIUM: MAGNESIUM: 1.8 mg/dL (ref 1.7–2.4)

## 2017-08-09 LAB — PHOSPHORUS: Phosphorus: 2.7 mg/dL (ref 2.5–4.6)

## 2017-08-09 LAB — PHENYTOIN LEVEL, TOTAL: Phenytoin Lvl: 9.2 ug/mL — ABNORMAL LOW (ref 10.0–20.0)

## 2017-08-09 MED ORDER — SODIUM CHLORIDE 0.9 % IV SOLN
INTRAVENOUS | Status: DC
  Administered 2017-08-09: 10 mL/h via INTRAVENOUS
  Administered 2017-08-14: 13:00:00 via INTRAVENOUS

## 2017-08-09 MED ORDER — AMLODIPINE 1 MG/ML ORAL SUSPENSION
2.5000 mg | Freq: Once | ORAL | Status: AC
  Administered 2017-08-09: 2.5 mg
  Filled 2017-08-09 (×2): qty 2.5

## 2017-08-09 MED ORDER — AMLODIPINE 1 MG/ML ORAL SUSPENSION
5.0000 mg | Freq: Every day | ORAL | Status: DC
  Administered 2017-08-10 – 2017-08-13 (×4): 5 mg via ORAL
  Filled 2017-08-09 (×6): qty 5

## 2017-08-09 MED ORDER — SODIUM CHLORIDE 0.9 % IV SOLN
1000.0000 mg | Freq: Two times a day (BID) | INTRAVENOUS | Status: DC
  Administered 2017-08-09 – 2017-08-21 (×24): 1000 mg via INTRAVENOUS
  Filled 2017-08-09 (×24): qty 10

## 2017-08-09 MED ORDER — PANTOPRAZOLE SODIUM 40 MG PO PACK
40.0000 mg | PACK | Freq: Every day | ORAL | Status: DC
  Administered 2017-08-09 – 2017-09-13 (×36): 40 mg
  Filled 2017-08-09 (×37): qty 20

## 2017-08-09 MED ORDER — AMLODIPINE BESYLATE 5 MG PO TABS: 5.0000 mg | ORAL_TABLET | Freq: Every day | ORAL | Status: DC

## 2017-08-09 MED ORDER — ALTEPLASE 2 MG IJ SOLR
2.0000 mg | Freq: Once | INTRAMUSCULAR | Status: AC
  Administered 2017-08-09: 2 mg
  Filled 2017-08-09: qty 2

## 2017-08-09 NOTE — Progress Notes (Signed)
PULMONARY / CRITICAL CARE MEDICINE   Name: [name] MRN: [id number] DOB: 03/28/1956    ADMISSION DATE:  08/02/2017  CHIEF COMPLAINT: Found down  HISTORY OF PRESENT ILLNESS:        This is a 62 year old diabetic with a history of alcohol and cocaine abuse who is previously been admitted to this hospital for a left thalamic hemorrhage.  She was found down the day of admission having lost bowel and bladder control.  She was brought to the [facility] ED where she was observed to have overt clinical seizure activity.  She was treated with Keppra fosphenytoin and Ativan with subsequent cessation of the clinical seizures. Seizure activity continued subclinically despite high dose propofol and versed. This was discontinued and replaced with pentobarbital for a goal of >48 hours without burst activity as per EEG.    SUBJECTIVE:  Pentobarbital discontinued as per Neurology.   VITAL SIGNS: BP (!) 143/78   Pulse 98   Temp 98.6 F (37 C) (Rectal)   Resp (!) 22   Ht 5\' 5"  (1.651 m)   Wt 208 lb 1.8 oz (94.4 kg)   SpO2 97%   BMI 34.63 kg/m   HEMODYNAMICS:   VENTILATOR SETTINGS: Vent Mode: PRVC FiO2 (%):  [40 %-50 %] 40 % Set Rate:  [24 bmp] 24 bmp Vt Set:  [500 mL] 500 mL PEEP:  [5 cmH20] 5 cmH20 Pressure Support:  [12 cmH20] 12 cmH20 Plateau Pressure:  [23 cmH20-29 cmH20] 25 cmH20  INTAKE / OUTPUT: I/O last 3 completed shifts: In: 7893 [I.V.:250; NG/GT:2050; IV Piggyback:1405] Out: 8101 [Urine:4725]  PHYSICAL EXAMINATION: General:  Chronically ill appearing female, No longer sedated but unresponsive to commands Neuro: RASS -4 no response to commands or sternal rub HEENT: MM pink/moist, PERRL with stable opacity of the lens bilaterally  CV: s1s2 RRR, no m/r/g auscultated PULM: Crackles bilaterally, more prominent in the right, no wheezing noted. Breath sounds adequate  GI: soft, non-tender, bowel sounds active Extremities: warm/dry, mild mild pedal edema bilaterally,  improving Skin: no rashes or lesions  LABS:  BMET Recent Labs  Lab 08/07/17 0440 08/08/17 0342 08/09/17 0410  NA 145 149* 149*  K 3.8 4.0 3.9  CL 117* 120* 118*  CO2 19* 19* 19*  BUN 75* 81* 94*  CREATININE 1.93* 1.60* 1.67*  GLUCOSE 220* 238* 187*   Electrolytes Recent Labs  Lab 08/07/17 0440 08/08/17 0342 08/09/17 0410  CALCIUM 7.9* 8.5* 8.2*  MG 2.2 2.0 1.8  PHOS 4.4 2.1* 2.7   CBC Recent Labs  Lab 08/08/17 0342 08/08/17 0952 08/09/17 0410  WBC 20.0* 18.8* 14.5*  HGB 12.1 12.2 13.2  HCT 37.6 39.3 42.2  PLT 307 321 251   Coag's No results for input(s): APTT, INR in the last 168 hours.  Sepsis Markers Recent Labs  Lab 08/02/17 1404 08/02/17 1748 08/05/17 1237 08/08/17 0952  LATICACIDVEN 2.23* 2.96* 1.8  --   PROCALCITON  --   --   --  1.02   ABG Recent Labs  Lab 08/06/17 0958 08/07/17 0430 08/08/17 0329  PHART 7.265* 7.237* 7.293*  PCO2ART 38.5 43.2 39.0  PO2ART 70.2* 68.0* 66.0*   Liver Enzymes Recent Labs  Lab 08/04/17 0454 08/07/17 0726 08/08/17 0342  AST 18 30 24   ALT 9* 14 12*  ALKPHOS 37* 62 67  BILITOT 0.5 0.2* 0.5  ALBUMIN 2.0* 1.8* 1.6*   Cardiac Enzymes No results for input(s): TROPONINI, PROBNP in the last 168 hours.  Glucose Recent Labs  Lab 08/08/17  8469 08/08/17 1139 08/08/17 1514 08/08/17 2001 08/08/17 2333 08/09/17 0323  GLUCAP 295* 288* 365* 306* 233* 157*   Imaging No results found.   STUDIES:  EEG 1/24>>>This is an abnormal EEG.  There is evidence of moderate generalized slowing of brain activity as well as severe focal slowing in the left temporal lobe.  The patient is not in non-convulsive status epilepticus. 2D echo 1/25>>> EF 62-95%, grade 2 diastolic dysfunction  MRI brain 1/25>>> 1. No acute intracranial abnormality. 2. 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 3. Remote hemorrhage in the left thalamus and left pons. 4. Age advanced atrophy. 5. Fluid in the nasopharynx and  paranasal sinuses is likely secondary to intubation.  CULTURES: Blood Cx 1/30>>> Respiratory 1/30>>>  ANTIBIOTICS: Vancomycin 1/30>>> Zosyn 1/30>>>  LINES/TUBES: CVC 1/24>>> ETT 1/24>>> NG/OG 1/24>>>  DISCUSSION: 89 yoF with a history of polysubstance abuse who presented after being found down to the ED whereupon she was observed to be in status epilepticus. She was loaded with anticonvulsants and intubated for airway protection. In addition she was found to be hyperglycemic with borderline anion gap. Patient remains unresponsive despite discontinuation of sedation. This is to be expected considering that she was placed on a fat soluble barbiturate for burst suppression.  ASSESSMENT / PLAN:   PULMONARY A: Acute respiratory failure 2/2 acute encephalopathy and seizure CXR-atelectasis slightly worse in RUL, bilateral opacities persist in the lower lung fields ABG 7.293/39/66/19-slightly improved  P:   Vent support - 8cc/kg F/u CXR Wean daily  CARDIOVASCULAR A:  HTN HFpEF: Echo-G2DD Cocaine abuse Hx w/ positive urine screen this admission P:  Continue telemetry Labetalol PRN for HTN as per parameters  Amlodipine 5mg  solution per tube daily  RENAL A:   Renal function stable with increased BUN Hyperchloremic/hypernatremia-stable Non-gap-hyperchloremic metabolic acidosis improving  P:   F/u chem  Free water flushes at 258ml Q4hours with Lasix 40mg IV x2 doses  GASTROINTESTINAL A:   No active issue  P:   PPI for GI ppx Tube feeds Nutrition consulted to guide feeding therapy Dulcolax suppository  Colace per tube  HEMATOLOGIC A:   No active issue  Hgb WNL's P:  CBC in am Heparin for DVT ppx   INFECTIOUS A:   No active issue  Initially some concern for aspiration with a mounting leukocytosis, now improving with antibiotics Gram negative rods in tracheal aspirate  P:    Initiated antibiotic therapy as above Trend WBC, fever, vitals and follow cultures as  indicated VAP prevention continued   ENDOCRINE A:   DKA - resolved.  Non-insulin dependent-DM  CBG readings improved with  P:   SSI resistant  Meal time coverage 6units Q4  Lantus 25 units daily  NEUROLOGIC A:   Encephalopathy Seizure disorder P: Continuous  EEG monitoring Continue AEDs as per neurology Sedation discontinued, pentobarbital discontinued prior day by neurology  RASS goal: -4  No family at bedside 1/29. Daughter consulted-will attempt to visit   Kathi Ludwig, MD Resident PCCM Physician Pager #: [phone]  Attending Note:  62 year old female with polysubstance abuse who presents with status epilepticus that underwent burst suppression and subsequently remains unresponsive on exam.  Patient is not weaning due to lack of efforts.  I reviewed CXR myself, ETT is in good position.  Infiltrate noted.  Cultures with GNR from sputum.  On vanc and zosyn.  Continue until cultures result.  Hold of weaning for now.  Family is coming in tomorrow to discuss plan of care.  PCCM will continue to follow.  The patient is critically ill with multiple organ systems failure and requires high complexity decision making for assessment and support, frequent evaluation and titration of therapies, application of advanced monitoring technologies and extensive interpretation of multiple databases.   Critical Care Time devoted to patient care services described in this note is  35  Minutes. This time reflects time of care of this signee Dr Jennet Maduro. This critical care time does not reflect procedure time, or teaching time or supervisory time of PA/NP/Med student/Med Resident etc but could involve care discussion time.  Rush Farmer, M.D. [facility] Pulmonary/Critical Care Medicine. Pager: [phone]. After hours pager: [phone].

## 2017-08-09 NOTE — Progress Notes (Addendum)
Inpatient Diabetes Program Recommendations  AACE/ADA: New Consensus Statement on Inpatient Glycemic Control (2015)  Target Ranges:  Prepandial:   less than 140 mg/dL      Peak postprandial:   less than 180 mg/dL (1-2 hours)      Critically ill patients:  140 - 180 mg/dL   Lab Results  Component Value Date   GLUCAP 157 (H) 08/09/2017   HGBA1C 14.6 (H) 05/26/2017    Review of Glycemic Control Results for ANELIESE, BEAUDRY (MRN [id number]) as of 08/09/2017 09:38  Ref. Range 08/08/2017 15:14 08/08/2017 20:01 08/08/2017 23:33 08/09/2017 03:23 08/09/2017 07:22  Glucose-Capillary Latest Ref Range: 65 - 99 mg/dL 365 (H) 306 (H) 233 (H) 157 (H) 157 (H)    Diabetes history: Type 2 DM Outpatient Diabetes medications: Novolog 70/30 20 Units BIDAC Current orders for Inpatient glycemic control: Lantus 25 Units QD, Novolog 0-20 Units Q4H, Novolog 6 Units Q4H  Inpatient Diabetes Program Recommendations:    Recently adjusted Lantus to 25 Units QD. Still continuing to have elevations in blood sugars in the afternoon. Patient may also benefit from increase in Novolog to 9 Units Q4H, if continuing to remain elevated. Will plan to see when appropriate.  Thanks, Bronson Curb, MSN, RNC-OB Diabetes Coordinator [phone] (8a-5p)

## 2017-08-09 NOTE — Progress Notes (Signed)
Reason for consult: Status epilepticus  Subjective:patient still intubated, no seizure activity. EEG shows epileptiform discahrges but no clinical seizures   ROS:  Unable to obtain due to poor mental status  Examination  Vital signs in last 24 hours: Temp:  [98 F (36.7 C)-100.3 F (37.9 C)] 100.3 F (37.9 C) (01/31 1534) Pulse Rate:  [92-106] 105 (01/31 1532) Resp:  [13-31] 24 (01/31 1532) BP: (123-168)/(75-94) 136/81 (01/31 1532) SpO2:  [91 %-100 %] 100 % (01/31 1532) FiO2 (%):  [40 %-50 %] 40 % (01/31 1532) Weight:  [94.4 kg (208 lb 1.8 oz)] 94.4 kg (208 lb 1.8 oz) (01/31 0406)  General: Not in distress, cooperative CVS: pulse-normal rate and rhythm RS: breathing comfortably Extremities: normal   Neuro: Neuro: Intubated, unresponsive Pupils sluggish, corneal + , no gag reflex Does not withdraw in any extremity Plantars mute  No spontaneous movements    Basic Metabolic Panel: Recent Labs  Lab 08/04/17 0454 08/05/17 0420 08/06/17 0410 08/07/17 0440 08/08/17 0342 08/09/17 0410  NA 139 139 139 145 149* 149*  K 3.6 4.6 4.3 3.8 4.0 3.9  CL 110 112* 111 117* 120* 118*  CO2 17* 15* 16* 19* 19* 19*  GLUCOSE 153* 237* 250* 220* 238* 187*  BUN 15 37* 60* 75* 81* 94*  CREATININE 2.04* 2.13* 2.38* 1.93* 1.60* 1.67*  CALCIUM 7.7* 7.7* 7.7* 7.9* 8.5* 8.2*  MG 2.2  --  2.1 2.2 2.0 1.8  PHOS 5.2*  --  7.0* 4.4 2.1* 2.7    CBC: Recent Labs  Lab 08/03/17 0303  08/06/17 0410 08/07/17 0440 08/08/17 0342 08/08/17 0952 08/09/17 0410  WBC 13.9*   < > 19.3* 16.4* 20.0* 18.8* 14.5*  NEUTROABS 9.9*  --   --   --   --  14.7*  --   HGB 12.3   < > 12.2 12.0 12.1 12.2 13.2  HCT 38.2   < > 38.6 38.5 37.6 39.3 42.2  MCV 96.2   < > 97.2 98.7 96.9 99.7 98.1  PLT 269   < > 227 275 307 321 251   < > = values in this interval not displayed.     Coagulation Studies: No results for input(s): LABPROT, INR in the last 72 hours.  Imaging Reviewed:     ASSESSMENT AND  PLAN  Impression: Super refractory status epilepticus Known history of seizures Prior thalamic hemorrhage and pontine hemorrhage Cocaine abuse Hyperglycemia  Recommendations: Patient off Pentobarbital x 3days, EEG shows burst suppression pattern however burst more frequent, sharp discharges seen in left hemisphere , no seizures Continue on LTM EEG  Will repeat MRI brain tomorrow  Maximized doses with  Keppra, Vimpat, Dilantin and Topamax , discussed case with pharmacy due to interaction of multiple seizure medications Keppra 1g BID Vimpat 20mg  BID Topamax 200 mg BID Phenytoin 100mg  BID ( last level 11.2, will recheck level today)   Goals of care: ICU team spoke to daughter, no trach/peg. Will see response of patient till Monday, if not withdraw care.       [name] [facility] Pager Number [phone] For questions after 7pm please refer to AMION to reach the Neurologist on call

## 2017-08-09 NOTE — Progress Notes (Signed)
LTM maintenance; paste added to T5, T3, A1, Fp1, Fp2, F8, T4, A2, P4, moved F8 due to slight irritation.

## 2017-08-09 NOTE — Progress Notes (Signed)
Spoke with daughter over the phone.  After discussion, patient would not want trach/peg.  LCB with no CPR/cardioversion or trach/peg.  Will change code status.  The patient is critically ill with multiple organ systems failure and requires high complexity decision making for assessment and support, frequent evaluation and titration of therapies, application of advanced monitoring technologies and extensive interpretation of multiple databases.   Critical Care Time devoted to patient care services described in this note is  45  Minutes. This time reflects time of care of this signee Dr Jennet Maduro. This critical care time does not reflect procedure time, or teaching time or supervisory time of PA/NP/Med student/Med Resident etc but could involve care discussion time.  Rush Farmer, M.D. [facility] Pulmonary/Critical Care Medicine. Pager: [phone]. After hours pager: [phone].

## 2017-08-09 NOTE — Procedures (Signed)
Electroencephalogram report- LTM  Ordering Physician: Dr. Lorraine Lax     Beginning date and time: 08/08/2017 0730 hours Ending date and time:  08/09/2017 0730 hours  Day of study: 7  Medications include: Keppra, Vimpat, Dilantin and Topamax  MENTAL STATUS (per technician's notes): Intubated. Unresponsive.  HISTORY: This 24 hours of intensive EEG monitoring with simultaneous video monitoring was performed for this patient with history of left thalamic stroke, unresponsiveness and witnessed convulsive seizures.  Patient is now intubated.  This EEG was requested to rule out subclinical electrographic seizures.  TECHNICAL DESCRIPTION:  The study consists of a continuous 16-channel multi-montage digital video EEG recording with twenty-one electrodes placed according to the International 10-20 System. Additional leads included eye leads, true temporal leads (T1, T2), and an EKG lead. Activation procedures were not done due to mental status.  REPORT: The background activity consists of diffuse suppression thats last 2 - 5 seconds with intermittent bursts lasting 1-6 seconds. The bursts were more prominent in the left hemispheric leads.   Intermittently, independent sharp waves were seen in the left anterior temporal region, phase reversing at F7 in the AP bipolar montage, outside of the bursts.  At around 1: 32 AM, there was a run of sharp waves with in the left temporal region lasting for about 10 seconds.  No obvious clinical activity was seen with the episode.  There were no pushbutton activations events during this recording.   INTERPRETATION: This is an abnormal EEG due to: 1) Sharp waves in the left temporal region 2) Burst suppression pattern  Clinical Correlation: The EEG continues to show burst suppression pattern, though the duration suppression is decreased as compared to the previous days recording. Epileptiform discharges were seen in the left temporal region.

## 2017-08-10 DIAGNOSIS — I10 Essential (primary) hypertension: Secondary | ICD-10-CM

## 2017-08-10 LAB — GLUCOSE, CAPILLARY
GLUCOSE-CAPILLARY: 169 mg/dL — AB (ref 65–99)
GLUCOSE-CAPILLARY: 200 mg/dL — AB (ref 65–99)
Glucose-Capillary: 176 mg/dL — ABNORMAL HIGH (ref 65–99)
Glucose-Capillary: 250 mg/dL — ABNORMAL HIGH (ref 65–99)
Glucose-Capillary: 289 mg/dL — ABNORMAL HIGH (ref 65–99)

## 2017-08-10 LAB — CULTURE, RESPIRATORY W GRAM STAIN

## 2017-08-10 LAB — BASIC METABOLIC PANEL
Anion gap: 11 (ref 5–15)
BUN: 78 mg/dL — AB (ref 6–20)
CALCIUM: 8.6 mg/dL — AB (ref 8.9–10.3)
CO2: 19 mmol/L — ABNORMAL LOW (ref 22–32)
CREATININE: 1.51 mg/dL — AB (ref 0.44–1.00)
Chloride: 121 mmol/L — ABNORMAL HIGH (ref 101–111)
GFR, EST AFRICAN AMERICAN: 42 mL/min — AB (ref >60)
GFR, EST NON AFRICAN AMERICAN: 36 mL/min — AB (ref >60)
Glucose, Bld: 194 mg/dL — ABNORMAL HIGH (ref 65–99)
Potassium: 3.1 mmol/L — ABNORMAL LOW (ref 3.5–5.1)
SODIUM: 151 mmol/L — AB (ref 135–145)

## 2017-08-10 LAB — CBC
HCT: 34.6 % — ABNORMAL LOW (ref 36.0–46.0)
HEMOGLOBIN: 11.5 g/dL — AB (ref 12.0–15.0)
MCH: 31.6 pg (ref 26.0–34.0)
MCHC: 33.2 g/dL (ref 30.0–36.0)
MCV: 95.1 fL (ref 78.0–100.0)
PLATELETS: 302 10*3/uL (ref 150–400)
RBC: 3.64 MIL/uL — ABNORMAL LOW (ref 3.87–5.11)
RDW: 15.4 % (ref 11.5–15.5)
WBC: 17.8 10*3/uL — ABNORMAL HIGH (ref 4.0–10.5)

## 2017-08-10 LAB — MAGNESIUM: MAGNESIUM: 1.8 mg/dL (ref 1.7–2.4)

## 2017-08-10 LAB — PHOSPHORUS: PHOSPHORUS: 1.6 mg/dL — AB (ref 2.5–4.6)

## 2017-08-10 LAB — CULTURE, RESPIRATORY

## 2017-08-10 MED ORDER — DEXTROSE 5 % IV SOLN
INTRAVENOUS | Status: DC
  Administered 2017-08-10: 11:00:00 via INTRAVENOUS
  Administered 2017-08-11: 50 mL via INTRAVENOUS

## 2017-08-10 MED ORDER — POTASSIUM PHOSPHATES 15 MMOLE/5ML IV SOLN
45.0000 mmol | Freq: Once | INTRAVENOUS | Status: AC
  Administered 2017-08-10: 45 mmol via INTRAVENOUS
  Filled 2017-08-10: qty 15

## 2017-08-10 MED ORDER — LABETALOL HCL 200 MG PO TABS
200.0000 mg | ORAL_TABLET | Freq: Three times a day (TID) | ORAL | Status: DC
  Administered 2017-08-10 – 2017-08-13 (×10): 200 mg via ORAL
  Filled 2017-08-10 (×11): qty 1

## 2017-08-10 MED ORDER — MAGNESIUM SULFATE 2 GM/50ML IV SOLN
2.0000 g | Freq: Once | INTRAVENOUS | Status: AC
  Administered 2017-08-10: 2 g via INTRAVENOUS
  Filled 2017-08-10: qty 50

## 2017-08-10 NOTE — Procedures (Signed)
Electroencephalogram report- LTM  Ordering Physician: Dr. Lorraine Lax     Beginning date and time: 08/09/2017 0730 hours Ending date and time:  08/10/2017 0730 hours  Day of study: 8  Medications include: Keppra, Vimpat, Dilantin and Topamax  MENTAL STATUS (per technician's notes): Intubated. Unresponsive.  HISTORY: This 24 hours of intensive EEG monitoring with simultaneous video monitoring was performed for this patient with history of left thalamic stroke, unresponsiveness and witnessed convulsive seizures.  Patient is now intubated.  This EEG was requested to rule out subclinical electrographic seizures.  TECHNICAL DESCRIPTION:  The study consists of a continuous 16-channel multi-montage digital video EEG recording with twenty-one electrodes placed according to the International 10-20 System. Additional leads included eye leads, true temporal leads (T1, T2), and an EKG lead. Activation procedures were not done due to mental status.  REPORT: The background activity consists of diffuse suppression thats last 2 - 5 seconds with intermittent bursts lasting 1-3 seconds. The bursts were more prominent in the left hemispheric leads.   Intermittently, independent sharp waves were seen in the left anterior temporal region, phase reversing at F7 in the AP bipolar montage, outside of the bursts. No electrographic seizures were seen.  There were no pushbutton activations events during this recording.   INTERPRETATION: This is an abnormal EEG due to: 1) Burst suppression pattern  Clinical Correlation: The EEG continues to show burst suppression pattern, though the duration suppression is decreased as compared to the previous days recording. There were sharply contoured wavesforms in the left temporal region during the bursts. No electrographic seizures were seen.

## 2017-08-10 NOTE — Progress Notes (Signed)
Reason for consult:   Subjective: Patient still unresponsive, now has gag reflex. Not clinically seizing.    ROS:  Unable to obtain due to poor mental status  Examination  Vital signs in last 24 hours: Temp:  [98.3 F (36.8 C)-100 F (37.8 C)] 100 F (37.8 C) (02/01 0741) Pulse Rate:  [95-114] 105 (02/01 1700) Resp:  [11-32] 30 (02/01 1700) BP: (131-193)/(68-102) 193/89 (02/01 1700) SpO2:  [93 %-100 %] 95 % (02/01 1700) FiO2 (%):  [30 %-40 %] 30 % (02/01 1600) Weight:  [94.4 kg (208 lb 1.8 oz)] 94.4 kg (208 lb 1.8 oz) (02/01 0400)  General: Not in distress, cooperative CVS: pulse-normal rate and rhythm RS: breathing comfortably Extremities: normal   Neuro: Mental Status: Patient does not respond to verbal stimuli.  Does not respond to deep sternal rub.  Does not follow commands.  No verbalizations noted.  Cranial Nerves: II: patient does not respond confrontation bilaterally, pupils 7mm bilaterally and sluggish III,IV,VI: doll's response absent bilaterally V,VII: corneal reflex: present VIII: patient does not respond to verbal stimuli IX,X: gag reflex present   Motor: Extremities flaccid throughout.  No spontaneous movement noted.  No purposeful movements noted.  Sensory: Does not respond to noxious stimuli in any extremity. Plantars: mute  Cerebellar: Unable to perform  Basic Metabolic Panel: Recent Labs  Lab 08/06/17 0410 08/07/17 0440 08/08/17 0342 08/09/17 0410 08/10/17 0922  NA 139 145 149* 149* 151*  K 4.3 3.8 4.0 3.9 3.1*  CL 111 117* 120* 118* 121*  CO2 16* 19* 19* 19* 19*  GLUCOSE 250* 220* 238* 187* 194*  BUN 60* 75* 81* 94* 78*  CREATININE 2.38* 1.93* 1.60* 1.67* 1.51*  CALCIUM 7.7* 7.9* 8.5* 8.2* 8.6*  MG 2.1 2.2 2.0 1.8 1.8  PHOS 7.0* 4.4 2.1* 2.7 1.6*    CBC: Recent Labs  Lab 08/07/17 0440 08/08/17 0342 08/08/17 0952 08/09/17 0410 08/10/17 0922  WBC 16.4* 20.0* 18.8* 14.5* 17.8*  NEUTROABS  --   --  14.7*  --   --   HGB 12.0  12.1 12.2 13.2 11.5*  HCT 38.5 37.6 39.3 42.2 34.6*  MCV 98.7 96.9 99.7 98.1 95.1  PLT 275 307 321 251 302     Coagulation Studies: No results for input(s): LABPROT, INR in the last 72 hours.  Imaging Reviewed:     ASSESSMENT AND PLAN  Patient still unresponsive, however regaing some cranial nerve reflexes. EEG more active, still showing burst supression however duration of suppression shorter. L frontotemporal sharps more prominent but not progressing to seizures.   Impression: Super refractory status epilepticus Known history of seizures Prior thalamic hemorrhage and pontine hemorrhage Cocaine abuse Hyperglycemia  Recommendations: Patient off Pentobarbital x 4 days, EEG shows burst suppression pattern, no seizures Will repeat MRI brain today Maximized doses with  Keppra, Vimpat, Dilantin and Topamax , discussed case with pharmacy due to interaction of multiple seizure medications Keppra 1g BID Vimpat 20mg  BID Topamax 200 mg BID Phenytoin 100mg  BID ( last level 11.2, will recheck level today)   Goals of care: ICU team spoke to daughter, no trach/peg. Will see response of patient till Monday, if not withdraw care.      Karena Addison [place] [facility] Pager Number [phone] For questions after 7pm please refer to AMION to reach the Neurologist on call

## 2017-08-10 NOTE — Care Management Note (Signed)
Case Management Note  Patient Details  Name: [name] MRN: [id number] Date of Birth: 1956-03-09  Subjective/Objective:     Pt admitted post being found down  - once she arrived to [facility] ED pt was found to have overt clinical seizure activity - pt remains ventilated             Action/Plan:  PTA from home with [facility] for [facility] - agency to follow back up with CM to inform of [place] modalities.  Toxicology screen positive for cocaine - CSW consulted   Expected Discharge Date:                  Expected Discharge Plan:  [place]  In-House Referral:     Discharge planning Services  CM Consult  Post Acute Care Choice:    Choice offered to:     DME Arranged:    DME Agency:     HH Arranged:    HH Agency:     Status of Service:     If discussed at [employer] [facility] [employer], dates discussed:    Additional Comments: 08/10/2017 CM informed that pt was discharged from [facility] due to noncompliance.  Pt remains on ventilator Maryclare Labrador, RN 08/10/2017, 3:54 PM

## 2017-08-10 NOTE — Progress Notes (Addendum)
PULMONARY / CRITICAL CARE MEDICINE   Name: [name] MRN: [id number] DOB: 23-Jun-1956    ADMISSION DATE:  08/02/2017  CHIEF COMPLAINT: Found down  HISTORY OF PRESENT ILLNESS:        This is a 62 year old diabetic with a history of alcohol and cocaine abuse who is previously been admitted to this hospital for a left thalamic hemorrhage.  She was found down the day of admission having lost bowel and bladder control.  She was brought to the [facility] ED where she was observed to have overt clinical seizure activity.  She was treated with Keppra fosphenytoin and Ativan with subsequent cessation of the clinical seizures. Seizure activity continued subclinically despite high dose propofol and versed. This was discontinued and replaced with pentobarbital for a goal of >48 hours without burst activity as per EEG.    SUBJECTIVE:  Patient remains unresponsive, hypertensive overnight  VITAL SIGNS: BP (!) 162/86   Pulse (!) 112   Temp 100 F (37.8 C) (Core)   Resp (!) 29   Ht 5\' 5"  (1.651 m)   Wt 94.4 kg (208 lb 1.8 oz)   SpO2 96%   BMI 34.63 kg/m   HEMODYNAMICS:   VENTILATOR SETTINGS: Vent Mode: PRVC FiO2 (%):  [40 %] 40 % Set Rate:  [24 bmp] 24 bmp Vt Set:  [500 mL] 500 mL PEEP:  [5 cmH20] 5 cmH20 Plateau Pressure:  [22 cmH20-28 cmH20] 25 cmH20  INTAKE / OUTPUT: I/O last 3 completed shifts: In: 6222 [I.V.:280; NG/GT:3230; IV Piggyback:1220] Out: 9798 [Urine:4065]  PHYSICAL EXAMINATION: General:  Chronically ill appearing female, No longer sedated but unresponsive to commands Neuro: RASS -4 no response to commands or sternal rub HEENT: MM pink/moist, PERRL with stable opacity of the lens bilaterally  CV: RRR, Nl S1/S2 and -M/R/G PULM: Crackles bilaterally, more prominent in the right, no wheezing noted. Breath sounds adequate  GI: Soft, NT, ND and +BS Extremities: Warm/dry, mild mild pedal edema bilaterally, improving Skin: No rashes or lesions  LABS:  BMET Recent  Labs  Lab 08/07/17 0440 08/08/17 0342 08/09/17 0410  NA 145 149* 149*  K 3.8 4.0 3.9  CL 117* 120* 118*  CO2 19* 19* 19*  BUN 75* 81* 94*  CREATININE 1.93* 1.60* 1.67*  GLUCOSE 220* 238* 187*   Electrolytes Recent Labs  Lab 08/07/17 0440 08/08/17 0342 08/09/17 0410  CALCIUM 7.9* 8.5* 8.2*  MG 2.2 2.0 1.8  PHOS 4.4 2.1* 2.7   CBC Recent Labs  Lab 08/08/17 0342 08/08/17 0952 08/09/17 0410  WBC 20.0* 18.8* 14.5*  HGB 12.1 12.2 13.2  HCT 37.6 39.3 42.2  PLT 307 321 251   Coag's No results for input(s): APTT, INR in the last 168 hours.  Sepsis Markers Recent Labs  Lab 08/05/17 1237 08/08/17 0952  LATICACIDVEN 1.8  --   PROCALCITON  --  1.02   ABG Recent Labs  Lab 08/06/17 0958 08/07/17 0430 08/08/17 0329  PHART 7.265* 7.237* 7.293*  PCO2ART 38.5 43.2 39.0  PO2ART 70.2* 68.0* 66.0*   Liver Enzymes Recent Labs  Lab 08/04/17 0454 08/07/17 0726 08/08/17 0342  AST 18 30 24   ALT 9* 14 12*  ALKPHOS 37* 62 67  BILITOT 0.5 0.2* 0.5  ALBUMIN 2.0* 1.8* 1.6*   Cardiac Enzymes No results for input(s): TROPONINI, PROBNP in the last 168 hours.  Glucose Recent Labs  Lab 08/09/17 1202 08/09/17 1534 08/09/17 1926 08/09/17 2318 08/10/17 0341 08/10/17 0743  GLUCAP 202* 139* 209* 118* 169* 200*  Imaging No results found.   STUDIES:  EEG 1/24>>>This is an abnormal EEG.  There is evidence of moderate generalized slowing of brain activity as well as severe focal slowing in the left temporal lobe.  The patient is not in non-convulsive status epilepticus. 2D echo 1/25>>> EF 95-28%, grade 2 diastolic dysfunction  MRI brain 1/25>>> 1. No acute intracranial abnormality. 2. 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 3. Remote hemorrhage in the left thalamus and left pons. 4. Age advanced atrophy. 5. Fluid in the nasopharynx and paranasal sinuses is likely secondary to intubation.  CULTURES: Blood Cx 1/30>>> Respiratory  1/30>>>  ANTIBIOTICS: Vancomycin 1/30>>> Zosyn 1/30>>>  LINES/TUBES: CVC 1/24>>> ETT 1/24>>> NG/OG 1/24>>>  DISCUSSION: 47 yoF with a history of polysubstance abuse who presented after being found down to the ED whereupon she was observed to be in status epilepticus. She was loaded with anticonvulsants and intubated for airway protection. In addition she was found to be hyperglycemic with borderline anion gap. Patient remains unresponsive despite discontinuation of sedation. This is to be expected considering that she was placed on a fat soluble barbiturate for burst suppression.  ASSESSMENT / PLAN:   PULMONARY A: Acute respiratory failure 2/2 acute encephalopathy and seizure CXR-atelectasis slightly worse in RUL, bilateral opacities persist in the lower lung fields ABG 7.293/39/66/19-slightly improved  P:   Continue full vent support F/u CXR Wean daily  CARDIOVASCULAR A:  HTN HFpEF: Echo-G2DD Cocaine abuse Hx w/ positive urine screen this admission P:  Continue telemetry Labetalol PRN for HTN as per parameters  Amlodipine 5mg  solution per tube daily Labetalol 200 mg PO TID with holding parameters  RENAL A:   Renal function stable with increased BUN Hyperchloremic/hypernatremia-stable Non-gap-hyperchloremic metabolic acidosis improving  P:   F/u chem  Free water flushes at 236ml Q4hours with Lasix 40mg IV x2 doses D5W at 50 ml/hr  GASTROINTESTINAL A:   No active issue  P:   PPI for GI ppx Tube feeds Nutrition consulted to guide feeding therapy Dulcolax suppository  Colace per tube  HEMATOLOGIC A:   No active issue  Hgb WNL's P:  CBC in am Heparin for DVT ppx   INFECTIOUS A:   No active issue  Initially some concern for aspiration with a mounting leukocytosis, now improving with antibiotics Gram negative rods in tracheal aspirate  P:    Initiated antibiotic therapy as above Trend WBC, fever, vitals and follow cultures as indicated VAP  prevention  ENDOCRINE A:   DKA - resolved.  Non-insulin dependent-DM  CBG readings improved with  P:   SSI resistant  Meal time coverage 6 units Q4  Lantus 25 units daily  NEUROLOGIC A:   Encephalopathy Seizure disorder P: Continuous  EEG monitoring Continue AEDs as per neurology Sedation discontinued, pentobarbital discontinued prior by neurology  Propofol per neuro for burst suppression RASS goal: 0 MRI today per neuro  No family bedside 2/1.  The patient is critically ill with multiple organ systems failure and requires high complexity decision making for assessment and support, frequent evaluation and titration of therapies, application of advanced monitoring technologies and extensive interpretation of multiple databases.   Critical Care Time devoted to patient care services described in this note is  35  Minutes. This time reflects time of care of this signee Dr Jennet Maduro. This critical care time does not reflect procedure time, or teaching time or supervisory time of PA/NP/Med student/Med Resident etc but could involve care discussion time.  Rush Farmer, M.D. [facility]  Pulmonary/Critical Care Medicine. Pager: [phone]. After hours pager: [phone].

## 2017-08-10 NOTE — Progress Notes (Signed)
LTM takedown - electrodes removed - no skin breakdown noted.

## 2017-08-11 ENCOUNTER — Inpatient Hospital Stay (HOSPITAL_COMMUNITY): Payer: Medicaid Other

## 2017-08-11 LAB — PHOSPHORUS: PHOSPHORUS: 3.3 mg/dL (ref 2.5–4.6)

## 2017-08-11 LAB — BLOOD GAS, ARTERIAL
Acid-base deficit: 5.5 mmol/L — ABNORMAL HIGH (ref 0.0–2.0)
Bicarbonate: 18.4 mmol/L — ABNORMAL LOW (ref 20.0–28.0)
Drawn by: 51702
FIO2: 30
O2 Saturation: 93.2 %
PATIENT TEMPERATURE: 100.3
PCO2 ART: 31.9 mmHg — AB (ref 32.0–48.0)
PEEP: 5 cmH2O
RATE: 24 resp/min
VT: 500 mL
pH, Arterial: 7.385 (ref 7.350–7.450)
pO2, Arterial: 75.5 mmHg — ABNORMAL LOW (ref 83.0–108.0)

## 2017-08-11 LAB — BASIC METABOLIC PANEL
ANION GAP: 13 (ref 5–15)
BUN: 67 mg/dL — ABNORMAL HIGH (ref 6–20)
CALCIUM: 8.3 mg/dL — AB (ref 8.9–10.3)
CO2: 19 mmol/L — ABNORMAL LOW (ref 22–32)
Chloride: 118 mmol/L — ABNORMAL HIGH (ref 101–111)
Creatinine, Ser: 1.37 mg/dL — ABNORMAL HIGH (ref 0.44–1.00)
GFR, EST AFRICAN AMERICAN: 48 mL/min — AB (ref >60)
GFR, EST NON AFRICAN AMERICAN: 41 mL/min — AB (ref >60)
GLUCOSE: 168 mg/dL — AB (ref 65–99)
Potassium: 3.3 mmol/L — ABNORMAL LOW (ref 3.5–5.1)
Sodium: 150 mmol/L — ABNORMAL HIGH (ref 135–145)

## 2017-08-11 LAB — GLUCOSE, CAPILLARY
GLUCOSE-CAPILLARY: 168 mg/dL — AB (ref 65–99)
Glucose-Capillary: 167 mg/dL — ABNORMAL HIGH (ref 65–99)
Glucose-Capillary: 262 mg/dL — ABNORMAL HIGH (ref 65–99)
Glucose-Capillary: 161 mg/dL — ABNORMAL HIGH (ref 65–99)
Glucose-Capillary: 229 mg/dL — ABNORMAL HIGH (ref 65–99)
Glucose-Capillary: 172 mg/dL — ABNORMAL HIGH (ref 65–99)

## 2017-08-11 LAB — CBC
HCT: 33.2 % — ABNORMAL LOW (ref 36.0–46.0)
Hemoglobin: 10.8 g/dL — ABNORMAL LOW (ref 12.0–15.0)
MCH: 30.9 pg (ref 26.0–34.0)
MCHC: 32.5 g/dL (ref 30.0–36.0)
MCV: 94.9 fL (ref 78.0–100.0)
PLATELETS: 282 10*3/uL (ref 150–400)
RBC: 3.5 MIL/uL — ABNORMAL LOW (ref 3.87–5.11)
RDW: 16 % — AB (ref 11.5–15.5)
WBC: 15.9 10*3/uL — AB (ref 4.0–10.5)

## 2017-08-11 LAB — MAGNESIUM: Magnesium: 2.1 mg/dL (ref 1.7–2.4)

## 2017-08-11 MED ORDER — FENTANYL CITRATE (PF) 100 MCG/2ML IJ SOLN
50.0000 ug | INTRAMUSCULAR | Status: DC | PRN
  Administered 2017-08-11 (×3): 50 ug via INTRAVENOUS
  Filled 2017-08-11 (×2): qty 2

## 2017-08-11 MED ORDER — GADOBENATE DIMEGLUMINE 529 MG/ML IV SOLN
20.0000 mL | Freq: Once | INTRAVENOUS | Status: AC
  Administered 2017-08-11: 17 mL via INTRAVENOUS

## 2017-08-11 MED ORDER — FENTANYL CITRATE (PF) 100 MCG/2ML IJ SOLN
50.0000 ug | INTRAMUSCULAR | Status: DC | PRN
  Administered 2017-08-11 – 2017-08-22 (×9): 100 ug via INTRAVENOUS
  Administered 2017-08-23: 50 ug via INTRAVENOUS
  Administered 2017-08-23 – 2017-08-24 (×5): 100 ug via INTRAVENOUS
  Filled 2017-08-11 (×18): qty 2

## 2017-08-11 MED ORDER — FENTANYL CITRATE (PF) 100 MCG/2ML IJ SOLN
INTRAMUSCULAR | Status: AC
  Filled 2017-08-11: qty 2

## 2017-08-11 MED ORDER — POTASSIUM CHLORIDE 20 MEQ/15ML (10%) PO SOLN
40.0000 meq | Freq: Once | ORAL | Status: AC
  Administered 2017-08-11: 40 meq
  Filled 2017-08-11: qty 30

## 2017-08-11 NOTE — Progress Notes (Signed)
On assessment, pt RR >35, breath sounds significantly more rhonchous than previously, HR >120. Fentanyl had been given at 1746. CVP 13. Notified NP at bedside, received orders for increased dose/frequency of prn Fentanyl as well as chest PT. Following chest PT, was able to suction more mucous out of ETT. Following fentanyl, pt RR <30.  Will continue to monitor.

## 2017-08-11 NOTE — Progress Notes (Signed)
Pharmacy Antibiotic Note  [name] is a 62 y.o. female admitted on 08/02/2017 with seizure activity, now on several antiepileptics. No antibiotics started at admission. Pharmacy has been consulted for  Zosyn dosing for a suspected pneumonia.   WBC is now down to 15.9 and patient Tmax 100.5. Renal function is improving with ~ CrCl 49.1 ml/min   Plan: Continue Zosyn 3.375g Q8H (EI) Monitor renal function, clinical status,  Monitor length of therapy  Height: 5\' 5"  (165.1 cm) Weight: 204 lb 2.3 oz (92.6 kg) IBW/kg (Calculated) : 57  Temp (24hrs), Avg:100.3 F (37.9 C), Min:100.1 F (37.8 C), Max:100.5 F (38.1 C)  Recent Labs  Lab 08/05/17 1237  08/07/17 0440 08/08/17 0342 08/08/17 0952 08/09/17 0410 08/10/17 0922 08/11/17 0422  WBC  --    < > 16.4* 20.0* 18.8* 14.5* 17.8* 15.9*  CREATININE  --    < > 1.93* 1.60*  --  1.67* 1.51* 1.37*  LATICACIDVEN 1.8  --   --   --   --   --   --   --    < > = values in this interval not displayed.    Estimated Creatinine Clearance: 49.1 mL/min (A) (by C-G formula based on SCr of 1.37 mg/dL (H)).    No Known Allergies  Antimicrobials this admission: Vancomycin 1/30>> 2/1 Zosyn 1/30 >>   Microbiology results: 1/30 BCx: NGTD 1/30 Sputum: Pseudomonas 1/24 MRSA PCR: Negative  Thank you for allowing pharmacy to be a part of this patient's care.  Jimmy Footman, PharmD, BCPS PGY2 Infectious Diseases Pharmacy Resident Pager: [phone]  08/11/2017 11:03 AM

## 2017-08-11 NOTE — Progress Notes (Signed)
PULMONARY / CRITICAL CARE MEDICINE   Name: [name] MRN: [id number] DOB: May 20, 1956    ADMISSION DATE:  08/02/2017  CHIEF COMPLAINT: Found down  HISTORY OF PRESENT ILLNESS:        This is a 62 year old diabetic with a history of alcohol and cocaine abuse who is previously been admitted to this hospital for a left thalamic hemorrhage.  She was found down the day of admission having lost bowel and bladder control.  She was brought to the [facility] ED where she was observed to have overt clinical seizure activity.  She was treated with Keppra fosphenytoin and Ativan with subsequent cessation of the clinical seizures. Seizure activity continued subclinically despite high dose propofol and versed. This was discontinued and replaced with pentobarbital for a goal of >48 hours without burst activity as per EEG.    SUBJECTIVE:  Pt to MRI this am . > acute watershed infarcts both cerebral hemispheres  Remains unresponsive    VITAL SIGNS: BP (!) 154/77   Pulse 98   Temp 100.3 F (37.9 C) (Oral)   Resp (!) 29   Ht 5\' 5"  (1.651 m)   Wt 204 lb 2.3 oz (92.6 kg)   SpO2 99%   BMI 33.97 kg/m   HEMODYNAMICS:   VENTILATOR SETTINGS: Vent Mode: PRVC FiO2 (%):  [30 %] 30 % Set Rate:  [24 bmp] 24 bmp Vt Set:  [500 mL] 500 mL PEEP:  [5 cmH20] 5 cmH20 Plateau Pressure:  [24 cmH20] 24 cmH20  INTAKE / OUTPUT: I/O last 3 completed shifts: In: 6519.5 [I.V.:1262.5; NG/GT:3810; IV [id number]:9485] Out: 4250 [Urine:4250]  PHYSICAL EXAMINATION: General:  Chronically ill appearing female,  unresponsive to commands Neuro: unresponsive  HEENT: AT /[place] , ETT  CV: RRR, Nl S1/S2 and -M/R/G PULM: Bilaterally crackles   GI: Soft, NT, ND and +BS Extremities: Warm/dry, mild mild pedal edema bilaterally  Skin: No rashes or lesions  LABS:  BMET Recent Labs  Lab 08/09/17 0410 08/10/17 0922 08/11/17 0422  NA 149* 151* 150*  K 3.9 3.1* 3.3*  CL 118* 121* 118*  CO2 19* 19* 19*  BUN 94* 78* 67*   CREATININE 1.67* 1.51* 1.37*  GLUCOSE 187* 194* 168*   Electrolytes Recent Labs  Lab 08/09/17 0410 08/10/17 0922 08/11/17 0422  CALCIUM 8.2* 8.6* 8.3*  MG 1.8 1.8 2.1  PHOS 2.7 1.6* 3.3   CBC Recent Labs  Lab 08/09/17 0410 08/10/17 0922 08/11/17 0422  WBC 14.5* 17.8* 15.9*  HGB 13.2 11.5* 10.8*  HCT 42.2 34.6* 33.2*  PLT 251 302 282   Coag's No results for input(s): APTT, INR in the last 168 hours.  Sepsis Markers Recent Labs  Lab 08/05/17 1237 08/08/17 0952  LATICACIDVEN 1.8  --   PROCALCITON  --  1.02   ABG Recent Labs  Lab 08/07/17 0430 08/08/17 0329 08/11/17 0330  PHART 7.237* 7.293* 7.385  PCO2ART 43.2 39.0 31.9*  PO2ART 68.0* 66.0* 75.5*   Liver Enzymes Recent Labs  Lab 08/07/17 0726 08/08/17 0342  AST 30 24  ALT 14 12*  ALKPHOS 62 67  BILITOT 0.2* 0.5  ALBUMIN 1.8* 1.6*   Cardiac Enzymes No results for input(s): TROPONINI, PROBNP in the last 168 hours.  Glucose Recent Labs  Lab 08/10/17 1120 08/10/17 1518 08/10/17 1941 08/10/17 2340 08/11/17 0408 08/11/17 0749  GLUCAP 176* 250* 289* 262* 167* 161*   Imaging Dg Chest Port 1 View  Result Date: 08/11/2017 CLINICAL DATA:  History of ETT. EXAM: PORTABLE CHEST 1 VIEW  COMPARISON:  August 09, 2017 FINDINGS: The NG tube terminates below today's film. The ETT and left central line are stable. No pneumothorax. Bibasilar opacities are stable. No change in the cardiomediastinal silhouette IMPRESSION: Stable support apparatus.  Stable bibasilar infiltrates. Electronically Signed   By: Dorise Bullion III M.D   On: 08/11/2017 07:48     STUDIES:  EEG 1/24>>>This is an abnormal EEG.  There is evidence of moderate generalized slowing of brain activity as well as severe focal slowing in the left temporal lobe.  The patient is not in non-convulsive status epilepticus. 2D echo 1/25>>> EF 62-69%, grade 2 diastolic dysfunction  MRI brain 1/25>>> 1. No acute intracranial abnormality. 2. Remote infarcts  of the brainstem and thalami bilaterally. 3. Remote hemorrhage in the left thalamus and left pons. 4. Age advanced atrophy. 5. Fluid in the nasopharynx and paranasal sinuses is likely secondary to intubation.  CULTURES: Blood Cx 1/30>>> Respiratory 1/30>>>Psuedomonas   ANTIBIOTICS: Vancomycin 1/30>>> Zosyn 1/30>>>  LINES/TUBES: CVC 1/24>>> ETT 1/24>>> NG/OG 1/24>>>  DISCUSSION: 89 yoF with a history of polysubstance abuse who presented after being found down to the ED whereupon she was observed to be in status epilepticus. She was loaded with anticonvulsants and intubated for airway protection. In addition she was found to be hyperglycemic with borderline anion gap. Patient remains unresponsive despite discontinuation of sedation. This is to be expected considering that she was placed on a fat soluble barbiturate for burst suppression.  ASSESSMENT / PLAN:   PULMONARY A: Acute respiratory failure 2/2 acute encephalopathy and seizure Psuedomonas PNA    P:   Continue full vent support F/u CXR Assess Wean daily See ID for abx   CARDIOVASCULAR A:  HTN HFpEF: Echo-G2DD Cocaine abuse Hx w/ positive urine screen this admission P:  Continue telemetry Labetalol PRN for HTN as per parameters  Amlodipine 5mg  solution per tube daily Labetalol 200 mg PO TID with holding parameters  RENAL A:   Renal function stable with increased BUN Hyperchloremic/hypernatremia-stable Non-gap-hyperchloremic metabolic acidosis improving  Hypokalemia  P:   F/u chem  Free water flushes at 284ml Q4hours  D5W at 50 ml/hr Replace K   GASTROINTESTINAL A:   No active issue  P:   PPI for GI ppx Tube feeds Dulcolax suppository  Colace per tube  HEMATOLOGIC A:   No active issue  Hgb WNL's P:  CBC in am Heparin for DVT ppx   INFECTIOUS A:   Psuedomonas tracheal aspirate/PNA   P:    Cont on Abx  Trend WBC, fever, vitals and follow cultures as indicated VAP  prevention  ENDOCRINE A:   DKA - resolved.  Non-insulin dependent-DM  CBG readings improved with  P:   SSI resistant  Meal time coverage 6 units Q4  Lantus 25 units daily  NEUROLOGIC A:   Encephalopathy -remains unresponsive not on sedation  Seizure disorder Acute Watershed infarcts both cerebral hemi on MRI 2/2  Neuro following   P: Continuous  EEG monitoring Continue AEDs as per neurology    No family bedside 2/2   Magdaleno Lortie NP-C  [facility]  [phone]   08/11/2017

## 2017-08-11 NOTE — Progress Notes (Signed)
Reason for consult:   Subjective: Patient has no clinical seizures. Got PRN fentanyl for increased HR, tachypnea.   ROS: Unable to obtain due to poor mental status  Examination  Vital signs in last 24 hours: Temp:  [100.1 F (37.8 C)-100.5 F (38.1 C)] 100.3 F (37.9 C) (02/02 0750) Pulse Rate:  [94-106] 105 (02/02 1800) Resp:  [16-36] 30 (02/02 1800) BP: (137-174)/(73-100) 149/74 (02/02 1800) SpO2:  [95 %-99 %] 97 % (02/02 1800) FiO2 (%):  [30 %-40 %] 30 % (02/02 1800) Weight:  [92.6 kg (204 lb 2.3 oz)] 92.6 kg (204 lb 2.3 oz) (02/02 0500)  General: Not in distress, cooperative CVS: pulse-normal rate and rhythm RS: breathing comfortably Extremities: normal   Neuro: no change in exam compared to yesterday Neuro: Mental Status: Patient does not respond to verbal stimuli.  Does not respond to deep sternal rub.  Does not follow commands.  No verbalizations noted.  Cranial Nerves: II: patient does not respond confrontation bilaterally, pupils 65mm bilaterally and sluggish III,IV,VI: doll's response absent bilaterally V,VII: corneal reflex: present VIII: patient does not respond to verbal stimuli IX,X: gag reflex present  Motor: Extremities flaccid throughout.  No spontaneous movement noted.  No purposeful movements noted.  Sensory: Does not respond to noxious stimuli in any extremity. Plantars: mute      Basic Metabolic Panel: Recent Labs  Lab 08/07/17 0440 08/08/17 0342 08/09/17 0410 08/10/17 0922 08/11/17 0422  NA 145 149* 149* 151* 150*  K 3.8 4.0 3.9 3.1* 3.3*  CL 117* 120* 118* 121* 118*  CO2 19* 19* 19* 19* 19*  GLUCOSE 220* 238* 187* 194* 168*  BUN 75* 81* 94* 78* 67*  CREATININE 1.93* 1.60* 1.67* 1.51* 1.37*  CALCIUM 7.9* 8.5* 8.2* 8.6* 8.3*  MG 2.2 2.0 1.8 1.8 2.1  PHOS 4.4 2.1* 2.7 1.6* 3.3    CBC: Recent Labs  Lab 08/08/17 0342 08/08/17 0952 08/09/17 0410 08/10/17 0922 08/11/17 0422  WBC 20.0* 18.8* 14.5* 17.8* 15.9*  NEUTROABS  --   14.7*  --   --   --   HGB 12.1 12.2 13.2 11.5* 10.8*  HCT 37.6 39.3 42.2 34.6* 33.2*  MCV 96.9 99.7 98.1 95.1 94.9  PLT 307 321 251 302 282     Coagulation Studies: No results for input(s): LABPROT, INR in the last 72 hours.  Imaging Reviewed: MRI brain     ASSESSMENT AND PLAN  Patient still unresponsive, no obvious clinical seizure activity. MRI obtained today showed bilateral watershed strokes. LTM EEG reconnected after MRI shows burst suppression pattern, however burst moe frequent. Has frequent left frontotemporal spikes but do not evolve into seizures.     Super refractory status epilepticus Known history of seizures Prior thalamic hemorrhage and pontine hemorrhage Cocaine abuse Hyperglycemia       Uremia Bilateral watershed ischemic infarcts  Recommendations: Patient off Pentobarbitalx 5 days, EEG shows burst suppression pattern, no seizures Maximized doses withKeppra, Vimpat, Dilantin and Topamax , discussed case with pharmacy due to interaction of multiple seizure medications Keppra 1g BID Vimpat 20mg  BID Topamax 200 mg BID Phenytoin 100mg  BID ( last level 11.2, will recheck level today)  Avoid hypotension, currently at 130 -170 SBP   Goals of care:  ICU team spoke to daughter, no trach/peg. Will patient is moving in the right direction with no recurrence of seizures, I highly doubt patient can awake by Monday. While kidney function is improving, patient was on sedation over nearly a week including 1-2 days of pentobarbital which  has long half life and so her being unresponsive is expected.. She also has developed strokes, which may impair cognition, but not necessarily life threatening/severely disabling.   Decision to pursue aggressive care will depend on patient's prior functional status, she has had multiple admissions to the hospital this year alone and should be taken into account. Also continues to abuse cocaine and I suspect she may continue to  suffer from strokes, seizures in the future.     Karena Addison Nimrit Kehres [facility] Pager Number [phone] For questions after 7pm please refer to AMION to reach the Neurologist on call

## 2017-08-11 NOTE — Progress Notes (Signed)
eLink Physician-Brief Progress Note Patient Name: [name] DOB: 1955-12-07 MRN: [id number]   Date of Service  08/11/2017  HPI/Events of Note  Liquid Stools - Request for Flexiseal.   eICU Interventions  Will order: 1. Place Flexiseal.      Intervention Category Major Interventions: Other:  Sommer,Steven Cornelia Copa 08/11/2017, 10:06 PM

## 2017-08-11 NOTE — Progress Notes (Signed)
vLTM EEG Running. No skin breakdown. Notified neuro

## 2017-08-11 NOTE — Progress Notes (Signed)
Pt RR 35-40, notified NP at bedside, received orders for prn Fentanyl 33mcg q2h. Following dose, pt RR returned to <30. Will continue to monitor.

## 2017-08-12 ENCOUNTER — Inpatient Hospital Stay (HOSPITAL_COMMUNITY): Payer: Medicaid Other

## 2017-08-12 LAB — GLUCOSE, CAPILLARY
GLUCOSE-CAPILLARY: 146 mg/dL — AB (ref 65–99)
GLUCOSE-CAPILLARY: 161 mg/dL — AB (ref 65–99)
GLUCOSE-CAPILLARY: 190 mg/dL — AB (ref 65–99)
GLUCOSE-CAPILLARY: 190 mg/dL — AB (ref 65–99)
GLUCOSE-CAPILLARY: 197 mg/dL — AB (ref 65–99)
GLUCOSE-CAPILLARY: 212 mg/dL — AB (ref 65–99)
Glucose-Capillary: 187 mg/dL — ABNORMAL HIGH (ref 65–99)

## 2017-08-12 LAB — CBC
HEMATOCRIT: 31.3 % — AB (ref 36.0–46.0)
HEMOGLOBIN: 10.1 g/dL — AB (ref 12.0–15.0)
MCH: 30.8 pg (ref 26.0–34.0)
MCHC: 32.3 g/dL (ref 30.0–36.0)
MCV: 95.4 fL (ref 78.0–100.0)
Platelets: 262 10*3/uL (ref 150–400)
RBC: 3.28 MIL/uL — AB (ref 3.87–5.11)
RDW: 15.7 % — ABNORMAL HIGH (ref 11.5–15.5)
WBC: 17.2 10*3/uL — AB (ref 4.0–10.5)

## 2017-08-12 LAB — BASIC METABOLIC PANEL
ANION GAP: 10 (ref 5–15)
BUN: 60 mg/dL — ABNORMAL HIGH (ref 6–20)
CHLORIDE: 120 mmol/L — AB (ref 101–111)
CO2: 19 mmol/L — AB (ref 22–32)
Calcium: 8.3 mg/dL — ABNORMAL LOW (ref 8.9–10.3)
Creatinine, Ser: 1.44 mg/dL — ABNORMAL HIGH (ref 0.44–1.00)
GFR calc non Af Amer: 39 mL/min — ABNORMAL LOW (ref >60)
GFR, EST AFRICAN AMERICAN: 45 mL/min — AB (ref >60)
Glucose, Bld: 244 mg/dL — ABNORMAL HIGH (ref 65–99)
Potassium: 3.8 mmol/L (ref 3.5–5.1)
Sodium: 149 mmol/L — ABNORMAL HIGH (ref 135–145)

## 2017-08-12 LAB — PHOSPHORUS: PHOSPHORUS: 3.5 mg/dL (ref 2.5–4.6)

## 2017-08-12 LAB — MAGNESIUM: Magnesium: 1.8 mg/dL (ref 1.7–2.4)

## 2017-08-12 LAB — TRIGLYCERIDES: Triglycerides: 189 mg/dL — ABNORMAL HIGH (ref <150)

## 2017-08-12 MED ORDER — ASPIRIN EC 81 MG PO TBEC
81.0000 mg | DELAYED_RELEASE_TABLET | Freq: Every day | ORAL | Status: DC
  Administered 2017-08-12 – 2017-08-19 (×7): 81 mg via ORAL
  Filled 2017-08-12 (×9): qty 1

## 2017-08-12 MED ORDER — PROPOFOL 1000 MG/100ML IV EMUL
0.0000 ug/kg/min | INTRAVENOUS | Status: DC
  Administered 2017-08-12 (×2): 20 ug/kg/min via INTRAVENOUS
  Administered 2017-08-13 (×3): 40 ug/kg/min via INTRAVENOUS
  Filled 2017-08-12 (×2): qty 100

## 2017-08-12 NOTE — Progress Notes (Signed)
Reason for consult: Status epilepticus  Subjective: No new events, patient continues to be agitated, requiring frequent Fentanyl doses PRN.    ROS: negative except above   Examination  Vital signs in last 24 hours: Temp:  [99 F (37.2 C)-100.6 F (38.1 C)] 99 F (37.2 C) (02/03 0900) Pulse Rate:  [97-106] 101 (02/03 1020) Resp:  [16-37] 32 (02/03 1000) BP: (104-179)/(66-90) 172/82 (02/03 1024) SpO2:  [95 %-100 %] 98 % (02/03 1245) FiO2 (%):  [30 %-40 %] 30 % (02/03 1123) Weight:  [91.2 kg (201 lb 1 oz)] 91.2 kg (201 lb 1 oz) (02/03 0500)  General: Not in distress, cooperative CVS: pulse-normal rate and rhythm RS: breathing comfortably Extremities: normal   Neuro: again, no significant change in exam compared to yesterday Neuro: Mental Status: Patient does not respond to verbal stimuli. Does not respond to deep sternal rub. Does not follow commands. No verbalizations noted.  Cranial Nerves: II: patient does not respond confrontation bilaterally, pupils1mm bilaterally and sluggish III,IV,VI: doll's response absent bilaterally V,VII: corneal reflex:present VIII: patient does not respond to verbal stimuli IX,X: gag reflexpresent Motor: Extremities flaccid throughout. No spontaneous movement noted. No purposeful movements noted.  Sensory: Does not respond to noxious stimuli in any extremity. Plantars:mute    Basic Metabolic Panel: Recent Labs  Lab 08/08/17 0342 08/09/17 0410 08/10/17 0922 08/11/17 0422 08/12/17 0424  NA 149* 149* 151* 150* 149*  K 4.0 3.9 3.1* 3.3* 3.8  CL 120* 118* 121* 118* 120*  CO2 19* 19* 19* 19* 19*  GLUCOSE 238* 187* 194* 168* 244*  BUN 81* 94* 78* 67* 60*  CREATININE 1.60* 1.67* 1.51* 1.37* 1.44*  CALCIUM 8.5* 8.2* 8.6* 8.3* 8.3*  MG 2.0 1.8 1.8 2.1 1.8  PHOS 2.1* 2.7 1.6* 3.3 3.5    CBC: Recent Labs  Lab 08/08/17 0952 08/09/17 0410 08/10/17 0922 08/11/17 0422 08/12/17 0424  WBC 18.8* 14.5* 17.8* 15.9* 17.2*   NEUTROABS 14.7*  --   --   --   --   HGB 12.2 13.2 11.5* 10.8* 10.1*  HCT 39.3 42.2 34.6* 33.2* 31.3*  MCV 99.7 98.1 95.1 94.9 95.4  PLT 321 251 302 282 262     Coagulation Studies: No results for input(s): LABPROT, INR in the last 72 hours.  Imaging Reviewed:     ASSESSMENT AND PLAN  Patient still unresponsive, no obvious clinical seizure activity. MRI obtained today showed bilateral watershed strokes. LTM EEG reconnected after MRI shows burst suppression pattern, however burst moe frequent. Has frequent left frontotemporal spikes but do not evolve into seizures.     Super refractory status epilepticus Known history of seizures Prior thalamic hemorrhage and pontine hemorrhage Cocaine abuse Hyperglycemia                                                                       Uremia Bilateral watershed ischemic infarcts  Recommendations: Patient off Pentobarbitalx5 days, EEG shows burst suppression pattern, no seizures Maximized doses withKeppra, Vimpat, Dilantin and Topamax , discussed case with pharmacy due to interaction of multiple seizure medications Keppra 1g BID Vimpat 20mg  BID Topamax 200 mg BID Phenytoin 100mg  BID ( last level 11.2, will recheck level today)  Avoid hypotension, currently at 130 -170 SBP  Low dose  Propofol for agitation  Added Asa 81mg  for stroke prevention   Goals of care: Trach/PEG   I had a detailed conversation with daughter who lives in [place] over the phone where I discussed path moving forward  - either withdrawal of care vs trach/PEG. From my understanding she initially had agreed to wait till Monday and was leaning towards withdrawal of care no trach/PEG   I told her the patient remains comatose after inducing pentobarbital coma which was required to stop seizures,  she has not had recurrence of seizures on LTM after pentobarbital for 5 days , but will need more time to recover. She also has developed strokes, which may  impair cognition, but they not necessarily life threatening/severely disabling. I explained to the daughter there is a good chance that by pursuing trach/PEG route she would likely end up in Nursing home for possibly remainder of her life. She may also go back into status epilepticus.  She also has had multiple admissions to the hospital this year alone and that should be taken into account. Also if she continues to abuse cocaine and I suspect she may continue to suffer from strokes, seizures in the future.   The daughter stated patient was functional and taking care of her spouse who is severely disabled prior to this and therefore not taking care of herself. She would like her mother to received Trach/PEG and nursing home to see if she improves but does not want to withdraw care, atleast at this time now.  I again insisted patient come to visit her mother and to have these conversations in person,however daughter is unable to visit her now.        Karena Addison Kolbie Lepkowski [facility] Pager Number [phone] For questions after 7pm please refer to AMION to reach the Neurologist on call

## 2017-08-12 NOTE — Procedures (Signed)
LTM-EEG Report   HISTORY: Continuous video-EEG monitoring performed for 62 year old with status epilepticus.  ACQUISITION: International 10-20 system for electrode placement; 18 channels with additional eyes linked to ipsilateral ears and EKG. Additional T1-T2 electrodes were used. Continuous video recording obtained.    EEG NUMBER:  MEDICATIONS:  Day 1: TPM, PHT, LEV, LCM   DAY #1: from 1221 08/11/17 to 0730 08/12/17  BACKGROUND: An overall medium voltage continuous recording with poor spontaneous variability and reactivity. The background consisted of medium voltage delta-theta activity bilaterally with sparse superimposed faster frequencies. State changes were present, however no clear stage II sleep architecture was seen.  EPILEPTIFORM/PERIODIC ACTIVITY: There were frequent high voltage focal spikes with overriding fast activity in the left anterior temporal region (F7 maximal), often in a quasi-periodic 0.5hz  pattern. These discharges did not have ictal evolution.  SEIZURES: none  EVENTS: none    EKG: no significant arrhythmia   SUMMARY: This was an abnormal continuous video EEG due to frequent focal epileptiform discharges in the left temporal region without ictal evolution. There was also loss of normal background features indicative of an encephalopathy pattern. No seizures were seen.

## 2017-08-12 NOTE — Progress Notes (Signed)
PULMONARY / CRITICAL CARE MEDICINE   Name: [name] MRN: [id number] DOB: [birth year]    ADMISSION DATE:  08/02/2017  CHIEF COMPLAINT: Found down  HISTORY OF PRESENT ILLNESS:        This is a 62 year old diabetic with a history of alcohol and cocaine abuse who is previously been admitted to this hospital for a left thalamic hemorrhage.  She was found down the day of admission having lost bowel and bladder control.  She was brought to the [facility] ED where she was observed to have overt clinical seizure activity.  She was treated with Keppra fosphenytoin and Ativan with subsequent cessation of the clinical seizures. Seizure activity continued subclinically despite high dose propofol and versed. This was discontinued and replaced with pentobarbital for a goal of >48 hours without burst activity as per EEG.    SUBJECTIVE:  Remains unresponsive , no seizure activity .  Vent dysynchrony despite fent    VITAL SIGNS: BP (!) 172/82   Pulse (!) 101   Temp 100 F (37.8 C)   Resp (!) 25   Ht 5\' 5"  (1.651 m)   Wt 201 lb 1 oz (91.2 kg)   SpO2 100%   BMI 33.46 kg/m   HEMODYNAMICS: CVP:  [0 mmHg-1 mmHg] 0 mmHg VENTILATOR SETTINGS: Vent Mode: PRVC FiO2 (%):  [30 %-40 %] 30 % Set Rate:  [24 bmp] 24 bmp Vt Set:  [500 mL] 500 mL PEEP:  [5 cmH20] 5 cmH20 Plateau Pressure:  [23 cmH20-27 cmH20] 23 cmH20  INTAKE / OUTPUT: I/O last 3 completed shifts: In: 96 [I.V.:2175; NG/GT:2920; IV Piggyback:558] Out: 2850 [Urine:2850]  PHYSICAL EXAMINATION: General:  Chronically ill appearing female,  unresponsive to commands Neuro: unresponsive  HEENT: AT /[place] , ETT  CV: RRR, Nl S1/S2 and -M/R/G PULM: decreased BS in bases  GI: Soft, NT, +distended  and +BS Extremities: Warm/dry, mild mild pedal edema bilaterally  Skin: No rashes or lesions  LABS:  BMET Recent Labs  Lab 08/10/17 0922 08/11/17 0422 08/12/17 0424  NA 151* 150* 149*  K 3.1* 3.3* 3.8  CL 121* 118* 120*  CO2 19* 19*  19*  BUN 78* 67* 60*  CREATININE 1.51* 1.37* 1.44*  GLUCOSE 194* 168* 244*   Electrolytes Recent Labs  Lab 08/10/17 0922 08/11/17 0422 08/12/17 0424  CALCIUM 8.6* 8.3* 8.3*  MG 1.8 2.1 1.8  PHOS 1.6* 3.3 3.5   CBC Recent Labs  Lab 08/10/17 0922 08/11/17 0422 08/12/17 0424  WBC 17.8* 15.9* 17.2*  HGB 11.5* 10.8* 10.1*  HCT 34.6* 33.2* 31.3*  PLT 302 282 262   Coag's No results for input(s): APTT, INR in the last 168 hours.  Sepsis Markers Recent Labs  Lab 08/05/17 1237 08/08/17 0952  LATICACIDVEN 1.8  --   PROCALCITON  --  1.02   ABG Recent Labs  Lab 08/07/17 0430 08/08/17 0329 08/11/17 0330  PHART 7.237* 7.293* 7.385  PCO2ART 43.2 39.0 31.9*  PO2ART 68.0* 66.0* 75.5*   Liver Enzymes Recent Labs  Lab 08/07/17 0726 08/08/17 0342  AST 30 24  ALT 14 12*  ALKPHOS 62 67  BILITOT 0.2* 0.5  ALBUMIN 1.8* 1.6*   Cardiac Enzymes No results for input(s): TROPONINI, PROBNP in the last 168 hours.  Glucose Recent Labs  Lab 08/11/17 1124 08/11/17 1602 08/11/17 1944 08/12/17 0004 08/12/17 0333 08/12/17 0744  GLUCAP 229* 168* 172* 187* 190* 197*   Imaging Mr Brain W Wo Contrast  Result Date: 08/11/2017 CLINICAL DATA:  Refractory status epilepticus.  History of seizures and cocaine abuse. EXAM: MRI HEAD WITHOUT AND WITH CONTRAST TECHNIQUE: Multiplanar, multiecho pulse sequences of the brain and surrounding structures were obtained without and with intravenous contrast. CONTRAST:  71mL MULTIHANCE GADOBENATE DIMEGLUMINE 529 MG/ML IV SOLN COMPARISON:  08/03/2017 FINDINGS: Brain: There are new patchy acute infarcts with relatively symmetric involvement of both cerebral hemispheres. The largest areas of infarct are in a parasagittal orientation at the centrum semiovale level. Additional smaller infarcts are also present in ACA/MCA and MCA/PCA watershed areas. Chronic hemorrhages are again noted in the pons and left thalamus. Additional nonhemorrhagic lacunar infarcts  are present in the right pons and near the ventral aspect of the right thalamus/genu of the internal capsule. There is a cavum septum pellucidum et vergae. Generalized cerebral atrophy is mildly advanced for age. No abnormal enhancement, mass, midline shift, or extra-axial fluid collection is identified. Vascular: Major intracranial vascular flow voids are preserved. Skull and upper cervical spine: Unremarkable bone marrow signal. Sinuses/Orbits: Unremarkable orbits. Moderate bilateral sphenoid and right ethmoid air cell opacification by fluid and mucosal thickening in the setting of intubation. Milder mucosal thickening in the maxillary sinuses. Moderate bilateral mastoid effusions. Other: None. IMPRESSION: 1. Acute watershed infarcts in both cerebral hemispheres. 2. Chronic infarcts and old hemorrhages involving the pons and thalami. Electronically Signed   By: Logan Bores M.D.   On: 08/11/2017 10:59   Dg Chest Port 1 View  Result Date: 08/12/2017 CLINICAL DATA:  Pneumonia. EXAM: PORTABLE CHEST 1 VIEW COMPARISON:  08/11/2017 FINDINGS: The tip of the endotracheal tube now projects approximately 1 cm above the carina. Enteric tube courses into the left upper abdomen with tip not imaged. Left jugular catheter terminates over the SV C. The cardiomediastinal silhouette is unchanged. Bibasilar airspace opacities are unchanged. No sizable pleural effusion or pneumothorax is identified. IMPRESSION: 1. Endotracheal tube 1 cm above the carina. Consider retracting slightly to avoid mainstem intubation with position change. 2. Unchanged bibasilar infiltrates. Electronically Signed   By: Logan Bores M.D.   On: 08/12/2017 06:45     STUDIES:  EEG 1/24>>>This is an abnormal EEG.  There is evidence of moderate generalized slowing of brain activity as well as severe focal slowing in the left temporal lobe.  The patient is not in non-convulsive status epilepticus. 2D echo 1/25>>> EF 32-95%, grade 2 diastolic dysfunction   MRI brain 1/25>>> 1. No acute intracranial abnormality. 2. 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 3. Remote hemorrhage in the left thalamus and left pons. 4. Age advanced atrophy. 5. Fluid in the nasopharynx and paranasal sinuses is likely secondary to intubation.  CULTURES: Blood Cx 1/30>>> Respiratory 1/30>>>Psuedomonas   ANTIBIOTICS: Vancomycin 1/30>>> Zosyn 1/30>>>  LINES/TUBES: CVC 1/24>>> ETT 1/24>>> NG/OG 1/24>>>  DISCUSSION: 72 yoF with a history of polysubstance abuse who presented after being found down to the ED whereupon she was observed to be in status epilepticus. She was loaded with anticonvulsants and intubated for airway protection. In addition she was found to be hyperglycemic with borderline anion gap. Patient remains unresponsive despite discontinuation of sedation. This is to be expected considering that she was placed on a fat soluble barbiturate for burst suppression. MRI 2/2 with watershed cerebral infarcts   ASSESSMENT / PLAN:   PULMONARY A: Acute respiratory failure 2/2 acute encephalopathy and seizure Psuedomonas PNA    P:   Continue full vent support F/u CXR Assess Wean daily See ID for abx   CARDIOVASCULAR A:  HTN HFpEF: Echo-G2DD Cocaine abuse Hx w/  positive urine screen this admission P:  Continue telemetry Labetalol PRN for HTN as per parameters  Amlodipine 5mg  solution per tube daily Labetalol 200 mg PO TID with holding parameters  RENAL A:   Renal function stable with increased BUN Hyperchloremic/hypernatremia-stable Non-gap-hyperchloremic metabolic acidosis improving  Hypokalemia -resolved  P:   F/u chem  Free water flushes at 257ml Q4hours  D/c D5W IVF    GASTROINTESTINAL A:   No active issue  P:   PPI for GI ppx Tube feeds Dulcolax suppository  Colace per tube  HEMATOLOGIC A:   Anemia   P:  CBC in am Heparin for DVT ppx   INFECTIOUS A:   Psuedomonas tracheal aspirate/PNA   P:     Cont on Abx  Trend WBC, fever, vitals and follow cultures as indicated VAP prevention  ENDOCRINE A:   DKA - resolved.  Non-insulin dependent-DM  CBG readings improved with  P:   SSI resistant  Meal time coverage 6 units Q4  Lantus 25 units daily  NEUROLOGIC A:   Encephalopathy -remains unresponsive not on sedation  Seizure disorder Acute Watershed infarcts both cerebral hemi on MRI 2/2  Neuro following   P: Continuous  EEG monitoring Continue AEDs as per neurology Add Propofol to help with vent syncrony   No family bedside 2/2   Idabelle Mcpeters NP-C  [facility]  [phone]   08/12/2017

## 2017-08-13 LAB — GLUCOSE, CAPILLARY
GLUCOSE-CAPILLARY: 174 mg/dL — AB (ref 65–99)
GLUCOSE-CAPILLARY: 174 mg/dL — AB (ref 65–99)
GLUCOSE-CAPILLARY: 192 mg/dL — AB (ref 65–99)
Glucose-Capillary: 151 mg/dL — ABNORMAL HIGH (ref 65–99)
Glucose-Capillary: 228 mg/dL — ABNORMAL HIGH (ref 65–99)
Glucose-Capillary: 282 mg/dL — ABNORMAL HIGH (ref 65–99)

## 2017-08-13 LAB — CBC
HEMATOCRIT: 30.4 % — AB (ref 36.0–46.0)
HEMOGLOBIN: 9.6 g/dL — AB (ref 12.0–15.0)
MCH: 30.5 pg (ref 26.0–34.0)
MCHC: 31.6 g/dL (ref 30.0–36.0)
MCV: 96.5 fL (ref 78.0–100.0)
Platelets: 285 10*3/uL (ref 150–400)
RBC: 3.15 MIL/uL — ABNORMAL LOW (ref 3.87–5.11)
RDW: 15.6 % — AB (ref 11.5–15.5)
WBC: 17.4 10*3/uL — ABNORMAL HIGH (ref 4.0–10.5)

## 2017-08-13 LAB — BASIC METABOLIC PANEL
Anion gap: 11 (ref 5–15)
BUN: 51 mg/dL — AB (ref 6–20)
CALCIUM: 8.2 mg/dL — AB (ref 8.9–10.3)
CHLORIDE: 120 mmol/L — AB (ref 101–111)
CO2: 19 mmol/L — AB (ref 22–32)
CREATININE: 1.39 mg/dL — AB (ref 0.44–1.00)
GFR calc non Af Amer: 40 mL/min — ABNORMAL LOW (ref >60)
GFR, EST AFRICAN AMERICAN: 47 mL/min — AB (ref >60)
Glucose, Bld: 156 mg/dL — ABNORMAL HIGH (ref 65–99)
Potassium: 3.1 mmol/L — ABNORMAL LOW (ref 3.5–5.1)
Sodium: 150 mmol/L — ABNORMAL HIGH (ref 135–145)

## 2017-08-13 LAB — CULTURE, BLOOD (ROUTINE X 2)
CULTURE: NO GROWTH
Culture: NO GROWTH
Special Requests: ADEQUATE
Special Requests: ADEQUATE

## 2017-08-13 LAB — PHENYTOIN LEVEL, TOTAL: Phenytoin Lvl: 3.2 ug/mL — ABNORMAL LOW (ref 10.0–20.0)

## 2017-08-13 MED ORDER — PHENYTOIN SODIUM 50 MG/ML IJ SOLN
100.0000 mg | Freq: Three times a day (TID) | INTRAMUSCULAR | Status: DC
  Administered 2017-08-13 – 2017-08-15 (×7): 100 mg via INTRAVENOUS
  Filled 2017-08-13 (×8): qty 2

## 2017-08-13 MED ORDER — SODIUM CHLORIDE 0.9 % IV SOLN
750.0000 mg | Freq: Once | INTRAVENOUS | Status: AC
  Administered 2017-08-13: 750 mg via INTRAVENOUS
  Filled 2017-08-13: qty 10

## 2017-08-13 MED ORDER — FREE WATER
300.0000 mL | Status: DC
  Administered 2017-08-13 – 2017-08-15 (×10): 300 mL

## 2017-08-13 MED ORDER — POTASSIUM CHLORIDE 20 MEQ/15ML (10%) PO SOLN
40.0000 meq | Freq: Once | ORAL | Status: AC
  Administered 2017-08-13: 40 meq
  Filled 2017-08-13: qty 30

## 2017-08-13 MED ORDER — POTASSIUM CHLORIDE 10 MEQ/50ML IV SOLN
10.0000 meq | INTRAVENOUS | Status: AC
  Administered 2017-08-13 (×4): 10 meq via INTRAVENOUS
  Filled 2017-08-13 (×4): qty 50

## 2017-08-13 MED ORDER — DEXTROSE 5 % IV SOLN
INTRAVENOUS | Status: AC
  Administered 2017-08-13: 16:00:00 via INTRAVENOUS

## 2017-08-13 MED ORDER — LABETALOL HCL 300 MG PO TABS
300.0000 mg | ORAL_TABLET | Freq: Three times a day (TID) | ORAL | Status: DC
  Administered 2017-08-13 – 2017-08-15 (×7): 300 mg via ORAL
  Filled 2017-08-13 (×9): qty 1

## 2017-08-13 NOTE — Progress Notes (Signed)
Unable to suction secretions due to thickness to reduce peak pressures on ventilator. Bagged patient to loosen secretions. Moderate amount obtained. Vitals stable.

## 2017-08-13 NOTE — Progress Notes (Signed)
MEDICATION RELATED CONSULT NOTE - Follow-up  Pharmacy Consult for phenytoin Indication: Seizures  No Known Allergies  Patient Measurements: Height: 5\' 5"  (165.1 cm) Weight: 207 lb 0.2 oz (93.9 kg) IBW/kg (Calculated) : 57  Vital Signs: Temp: 99.5 F (37.5 C) (02/04 1500) BP: 188/84 (02/04 1500) Pulse Rate: 100 (02/04 1500) Intake/Output from previous day: 02/03 0701 - 02/04 0700 In: 3122 [I.V.:703.5; NG/GT:1710; IV Piggyback:583.5] Out: 2450 [Urine:2450] Intake/Output from this shift: Total I/O In: 1147.7 [I.V.:166.7; NG/GT:780; IV Piggyback:201] Out: 910 [Urine:910]  Labs: Recent Labs    08/11/17 0422 08/12/17 0424 08/13/17 0548  WBC 15.9* 17.2* 17.4*  HGB 10.8* 10.1* 9.6*  HCT 33.2* 31.3* 30.4*  PLT 282 262 285  CREATININE 1.37* 1.44* 1.39*  MG 2.1 1.8  --   PHOS 3.3 3.5  --    Estimated Creatinine Clearance: 48.8 mL/min (A) (by C-G formula based on SCr of 1.39 mg/dL (H)).   Microbiology: Recent Results (from the past 720 hour(s))  MRSA PCR Screening     Status: None   Collection Time: 08/02/17 11:00 PM  Result Value Ref Range Status   MRSA by PCR NEGATIVE NEGATIVE Final    Comment:        The GeneXpert MRSA Assay (FDA approved for NASAL specimens only), is one component of a comprehensive MRSA colonization surveillance program. It is not intended to diagnose MRSA infection nor to guide or monitor treatment for MRSA infections.   Culture, respiratory (NON-Expectorated)     Status: None   Collection Time: 08/08/17 10:06 AM  Result Value Ref Range Status   Specimen Description TRACHEAL ASPIRATE  Final   Special Requests NONE  Final   Gram Stain   Final    MODERATE WBC PRESENT, PREDOMINANTLY PMN FEW GRAM NEGATIVE RODS FEW GRAM POSITIVE COCCI IN PAIRS    Culture   Final    ABUNDANT PSEUDOMONAS AERUGINOSA MODERATE GROUP B STREP(S.AGALACTIAE)ISOLATED TESTING AGAINST S. AGALACTIAE NOT ROUTINELY PERFORMED DUE TO PREDICTABILITY OF AMP/PEN/VAN  SUSCEPTIBILITY. Performed at [facility], [place] [address] [place], [place] [address]    Report Status 08/10/2017 FINAL  Final   Organism ID, Bacteria PSEUDOMONAS AERUGINOSA  Final      Susceptibility   Pseudomonas aeruginosa - MIC*    CEFTAZIDIME 4 SENSITIVE Sensitive     CIPROFLOXACIN <=0.25 SENSITIVE Sensitive     GENTAMICIN <=1 SENSITIVE Sensitive     IMIPENEM 2 SENSITIVE Sensitive     PIP/TAZO 8 SENSITIVE Sensitive     CEFEPIME 2 SENSITIVE Sensitive     * ABUNDANT PSEUDOMONAS AERUGINOSA  Culture, blood (routine x 2)     Status: None (Preliminary result)   Collection Time: 08/08/17 11:00 AM  Result Value Ref Range Status   Specimen Description BLOOD RIGHT ANTECUBITAL  Final   Special Requests IN PEDIATRIC BOTTLE Blood Culture adequate volume  Final   Culture   Final    NO GROWTH 4 DAYS Performed at [facility], [place] [address], [place], [place] [address]    Report Status PENDING  Incomplete  Culture, blood (routine x 2)     Status: None (Preliminary result)   Collection Time: 08/08/17 11:07 AM  Result Value Ref Range Status   Specimen Description BLOOD RIGHT ANTECUBITAL  Final   Special Requests IN PEDIATRIC BOTTLE Blood Culture adequate volume  Final   Culture   Final    NO GROWTH 4 DAYS Performed at [facility], [place] [address], [place], [place] [address]  Report Status PENDING  Incomplete    Medical History: Past Medical History:  Diagnosis Date  . Cocaine abuse, continuous ([place])   . Diabetes mellitus    Hb A1C = 12.6 on 05/15/11, managed on Novolog 70/30, 35 U qam, 25 U qpm  . Hypertension    poorly controlled  . Insomnia disorder   . Shortness of breath     Medications:  Medications Prior to Admission  Medication Sig Dispense Refill Last Dose  . amLODipine (NORVASC) 10 MG tablet Take 1 tablet (10 mg total) daily by mouth. 30 tablet 1 LF 07-30-17 at 30DS  . collagenase (SANTYL) ointment Apply topically daily. Apply Santyl to right leg  wound Q day, then cover with moist gauze and dry gauze and kerlex 15 g 0 LF 07-19-17 at 30DS  . furosemide (LASIX) 20 MG tablet Take 20 mg by mouth daily.  2 LF 07-31-17 at 30DS  . gabapentin (NEURONTIN) 300 MG capsule Take 1 capsule (300 mg total) 3 (three) times daily by mouth. 90 capsule 1 LF 07-31-17 at 30DS  . insulin aspart protamine- aspart (NOVOLOG MIX 70/30) (70-30) 100 UNIT/ML injection Inject 0.2 mLs (20 Units total) into the skin 2 (two) times daily with a meal.   LF 07-31-17 at 30DS  . levETIRAcetam (KEPPRA) 500 MG tablet Take 1 tablet (500 mg total) 2 (two) times daily by mouth. 60 tablet 1 LF 05-16-17 at 30DS  . metoprolol tartrate (LOPRESSOR) 25 MG tablet Take 25 mg by mouth 2 (two) times daily.  0 LF 07-30-17 at 30DS  . acetaminophen (TYLENOL) 325 MG tablet Take 2 tablets (650 mg total) every 6 (six) hours as needed by mouth for mild pain, fever or headache. 40 tablet 0 UNK  . atorvastatin (LIPITOR) 40 MG tablet Take 1 tablet (40 mg total) daily at 6 PM by mouth. 30 tablet 1 LF 05-16-17 at 30DS  . cyclobenzaprine (FLEXERIL) 10 MG tablet Take 1 tablet (10 mg total) at bedtime by mouth. 30 tablet 0 LF 05-16-17 at 30DS  . folic acid (FOLVITE) 1 MG tablet Take 1 tablet (1 mg total) by mouth daily. 30 tablet 0 LF 06-04-17 at 30DS  . Multiple Vitamin (MULTIVITAMIN WITH MINERALS) TABS tablet Take 1 tablet by mouth daily.   UNK  . nystatin cream (MYCOSTATIN) Apply topically 2 (two) times daily. 30 g 0 LF 06-04-17 at 14DS  . oxybutynin (DITROPAN-XL) 5 MG 24 hr tablet Take 1 tablet (5 mg total) at bedtime by mouth. 30 tablet 0 05-16-17 at 30DS  . pantoprazole (PROTONIX) 40 MG tablet Take 1 tablet (40 mg total) by mouth daily.   LF 05-16-17 at 30DS  . PROAIR HFA 108 (90 Base) MCG/ACT inhaler Inhale 2 puffs into the lungs 4 (four) times daily as needed for wheezing.  0 LF 10-08-16 at PRN  . thiamine 100 MG tablet Take 1 tablet (100 mg total) by mouth daily. 30 tablet 0 LF 06-04-17 at 30DS     Assessment: Pharmacy helping with phenytoin dosing in collaboration with neurology.   1/29: Phenytoin level was high, corrected level ~30 and Phenytoin was held. 1/30: Phenytoin random level corrected to ~20, Phenytoin was resumed at lower dose of 100mg  IV BID. 1/31: PM random Phenytoin level corrected to 21.9, no change 2/4: Phenytoin level low, corrects to 7.62 (but using old albumin of 1.6 from 1/31), this is a 5 day level.   Goal of Therapy:  Phenytoin level 10-20 ug/mL  Plan:  Per Neurologist- Fosphenytoin 750 mg  IV x1 given and Phenytoin increased to 100 mg IV TID.  Repeating random Phenytoin level in AM Will continue to follow with Neurology.  Sloan Leiter, PharmD, BCPS, BCCCP Clinical Pharmacist Clinical phone 08/13/2017 until 3:[phone] After hours, please call [phone] 08/13/2017, 3:31 PM

## 2017-08-13 NOTE — Progress Notes (Signed)
PULMONARY / CRITICAL CARE MEDICINE   Name: Quamesha Mullet MRN: [id number] DOB: February 13, 1956    ADMISSION DATE:  08/02/2017 CONSULTATION DATE:  08/02/2017  CHIEF COMPLAINT:  Found down  HISTORY OF PRESENT ILLNESS:   62 y/o female with a history of left thalamic hemorrhage previously who was admitted with seizure activity.     SUBJECTIVE:  Remains unresponsive Some vent synchrony requiring fentanyl  VITAL SIGNS: BP (!) 204/92   Pulse 100   Temp 99.3 F (37.4 C)   Resp (!) 27   Ht 5\' 5"  (1.651 m)   Wt 207 lb 0.2 oz (93.9 kg)   SpO2 100%   BMI 34.45 kg/m   HEMODYNAMICS:    VENTILATOR SETTINGS: Vent Mode: PRVC FiO2 (%):  [30 %] 30 % Set Rate:  [24 bmp] 24 bmp Vt Set:  [500 mL] 500 mL PEEP:  [5 cmH20] 5 cmH20 Plateau Pressure:  [23 cmH20-26 cmH20] 23 cmH20  INTAKE / OUTPUT: I/O last 3 completed shifts: In: 4803 [I.V.:1473.5; Other:125; NG/GT:2440; IV Piggyback:764.5] Out: 3050 [Urine:3050]  PHYSICAL EXAMINATION:  General:  In bed on vent HENT: NCAT ETT in place PULM: CTA B, vent supported breathing CV: RRR, no mgr GI: BS+, soft, nontender MSK: normal bulk and tone Neuro: sedated on vent   LABS:  BMET Recent Labs  Lab 08/11/17 0422 08/12/17 0424 08/13/17 0548  NA 150* 149* 150*  K 3.3* 3.8 3.1*  CL 118* 120* 120*  CO2 19* 19* 19*  BUN 67* 60* 51*  CREATININE 1.37* 1.44* 1.39*  GLUCOSE 168* 244* 156*    Electrolytes Recent Labs  Lab 08/10/17 0922 08/11/17 0422 08/12/17 0424 08/13/17 0548  CALCIUM 8.6* 8.3* 8.3* 8.2*  MG 1.8 2.1 1.8  --   PHOS 1.6* 3.3 3.5  --     CBC Recent Labs  Lab 08/11/17 0422 08/12/17 0424 08/13/17 0548  WBC 15.9* 17.2* 17.4*  HGB 10.8* 10.1* 9.6*  HCT 33.2* 31.3* 30.4*  PLT 282 262 285    Coag's No results for input(s): APTT, INR in the last 168 hours.  Sepsis Markers Recent Labs  Lab 08/08/17 0952  PROCALCITON 1.02    ABG Recent Labs  Lab 08/07/17 0430 08/08/17 0329 08/11/17 0330  PHART  7.237* 7.293* 7.385  PCO2ART 43.2 39.0 31.9*  PO2ART 68.0* 66.0* 75.5*    Liver Enzymes Recent Labs  Lab 08/07/17 0726 08/08/17 0342  AST 30 24  ALT 14 12*  ALKPHOS 62 67  BILITOT 0.2* 0.5  ALBUMIN 1.8* 1.6*    Cardiac Enzymes No results for input(s): TROPONINI, PROBNP in the last 168 hours.  Glucose Recent Labs  Lab 08/12/17 1543 08/12/17 2008 08/12/17 2337 08/13/17 0359 08/13/17 0742 08/13/17 1134  GLUCAP 212* 146* 161* 174* 151* 174*    Imaging No results found.   STUDIES:  EEG 1/24>>>This is an abnormal EEG.There is evidence of moderate generalized slowing of brain activity as well as severe focal slowing in the left temporal lobe. The patient is not in non-convulsive status epilepticus. 2D echo 1/25>>> EF 29-51%, grade 2 diastolic dysfunction  MRI brain 1/25>>> 1. No acute intracranial abnormality. 2. 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 3. Remote hemorrhage in the left thalamus and left pons. 4. Age advanced atrophy. 5. Fluid in the nasopharynx and paranasal sinuses is likely secondary to intubation.  CULTURES: Blood Cx 1/30>>> Respiratory 1/30>>>Psuedomonas   ANTIBIOTICS: Vancomycin 1/30>>> Zosyn 1/30>>>  LINES/TUBES: CVC 1/24>>> ETT 1/24>>> NG/OG 1/24>>>    DISCUSSION: 62 y/o female  with a history of stroke, cocaine abuse here with status epilepticus.  MRI brain over weekend showed watershed infarct.    ASSESSMENT / PLAN:  PULMONARY A: Acute respiratory failure with hypoxemia P:   Full mechanical vent support VAP prevention Daily WUA/SBT   CARDIOVASCULAR A:  HTN Grade 2 diastolic dysfunction, chronic diastolic heart faiulre Non-gap metabolic acidosis Cocaine abuse with positive urine drug screen P:  Tele Labetalol prn and scheduled Amlodipine Increase scheduled labetalol  RENAL A:   Hypernatremai hypokalemia P:   Monitor BMET and UOP Replace electlytes as needed Increase free  water  GASTROINTESTINAL A:   No acute issues P:   Continue tube feeding Dulcolax prn   HEMATOLOGIC A:   Anemia, no bleeding P:  Monitor for bleeding  INFECTIOUS A:   Pseudomonas Pneumonia P:   Stop zosyn 2/5  ENDOCRINE A:   Hyperglycemia  P:   SSI  NEUROLOGIC A:   Watershed infarct Acute encephalopathy Status epilepticus P:   RASS goal: 0 to -1 Fentanyl for vent synchrony Hold propofol Serial neuro exams After 24-48 hours off propofol will revisit neuro prognosis   FAMILY  - Updates: none bedside  - Inter-disciplinary family meet or Palliative Care meeting due by:  day 7  My cc time 35 minutes  Roselie Awkward, MD [place] [facility] Pager: [phone] Cell: [phone] After 3pm or if no response, call [phone]    08/13/2017, 2:49 PM

## 2017-08-13 NOTE — Progress Notes (Signed)
Patient checked, impedances within acceptable limits, EKG leads reset.  No skin breakdown noted. Will continue to monitor.

## 2017-08-13 NOTE — Procedures (Signed)
LTM-EEG Report  HISTORY: Continuous video-EEG monitoring performed for 62 year old with status epilepticus. ACQUISITION: International 10-20 system for electrode placement; 18 channels with additional eyes linked to ipsilateral ears and EKG. Additional T1-T2 electrodes were used. Continuous video recording obtained.  EEG NUMBER: MEDICATIONS: Day 1: TPM, PHT, LEV, LCM Day 2: TPM, PHT, LEV, LCM  DAY #1: from 1221 08/11/17 to 0730 08/12/17 BACKGROUND: An overall medium voltage continuous recording with poor spontaneous variability and reactivity. The background consisted of medium voltage delta-theta activity bilaterally with sparse superimposed faster frequencies. State changes were present, however no clear stage II sleep architecture was seen. EPILEPTIFORM/PERIODIC ACTIVITY: There were frequent high voltage focal spikes with overriding fast activity in the left anterior temporal region (F7 maximal), often in a quasi-periodic 0.5hz  pattern. These discharges did not have ictal evolution. SEIZURES: none EVENTS: none  DAY #2: from 0730 08/12/17 to 0730 08/13/17 BACKGROUND: An overall medium voltage continuous recording with poor spontaneous variability and reactivity. The background consisted of medium voltage delta-theta activity bilaterally with sparse superimposed faster frequencies. State changes were present, however no clear stage II sleep architecture was seen. EPILEPTIFORM/PERIODIC ACTIVITY: There were frequent high voltage focal spikes with overriding fast activity in the left anterior temporal region (F7 maximal), often in a quasi-periodic 0.5hz  pattern. On two occasions, short runs of 2-3hz  discharges lasting 5 seconds were seen. These discharges did not have ictal evolution. SEIZURES: none EVENTS: none  EKG: no significant arrhythmia  SUMMARY: This was an abnormal continuous video EEG due to frequent focal epileptiform discharges in the left temporal region without ictal  evolution. Epileptiform discharges were stable compared to the previous day's recording. There was also loss of normal background features indicative of an encephalopathy pattern. No seizures were seen.

## 2017-08-13 NOTE — Progress Notes (Signed)
Subjective: Was started on propofol due to vent dyssynchrony.   Exam: Vitals:   08/13/17 0500 08/13/17 0600  BP: (!) 155/70 (!) 155/70  Pulse: 92 93  Resp: (!) 24 (!) 27  Temp: 99.9 F (37.7 C) 99.7 F (37.6 C)  SpO2: 96% 99%   Gen: In bed, NAD Resp: non-labored breathing, no acute distress Abd: soft, nt  Neuro: MS: Does not open eyes or follow commands.  MC:NOBSJ, corneals intact.  Motor: Does nto respond to noxious stimulation.  Sensory: as above   Pertinent Labs: PHT 9.2, 1/31  Impression: 62 yo  F with refractory status epilepticus due to seizure medication non-compliance, hyperosmolar state, and cocaine use. She was in subtle convulsive status epilepticus on arrival and presumably was in this state for some time as evidenced by the state in which she was found. She had not been seen for two days prior to being found.   Her seizures have been super refractory despite high doses of propofol. She was changed to pentobarbital on 1/27 due to continued seizures and reaching a maximum dose on propofol. Since weaning, no further seizures, but with continued evidence of severe cortical irritability(read from 2/3 - 2/4 pending).   She is still on propofol, would favor ensuring she can tolerate no sedatives prior to stopping LTM.   Dr. Lorraine Lax had a conversation describing likely outcomes with the daughter who indicates she would want to continue aggressive care(e.g. Trach/peg).  Recommendations: 1) D/C propofol.  2) Continue keppra 1g Q12H 3) continue vimpat 200mg  BID 4) continue dilantin 100mg  Q12H(likley will increase if level is low.  5) continue topamax 200mg  BID 6) will d/c LTM if no seizures by tomorrow.    Roland Rack, MD [facility] [phone]  If 7pm- 7am, please page neurology on call as listed in [place].

## 2017-08-13 NOTE — Progress Notes (Signed)
[facility] Progress Note Patient Name: Dandra Shambaugh DOB: [birth year] MRN: [id number]   Date of Service  08/13/2017  HPI/Events of Note  K+ = 3.1 and Creatinine = 1.39.  eICU Interventions  Will replace K+.     Intervention Category Major Interventions: Electrolyte abnormality - evaluation and management  Sommer,Steven Eugene 08/13/2017, 6:48 AM

## 2017-08-14 ENCOUNTER — Inpatient Hospital Stay (HOSPITAL_COMMUNITY): Payer: Medicaid Other

## 2017-08-14 LAB — GLUCOSE, CAPILLARY
GLUCOSE-CAPILLARY: 144 mg/dL — AB (ref 65–99)
GLUCOSE-CAPILLARY: 170 mg/dL — AB (ref 65–99)
GLUCOSE-CAPILLARY: 177 mg/dL — AB (ref 65–99)
Glucose-Capillary: 157 mg/dL — ABNORMAL HIGH (ref 65–99)
Glucose-Capillary: 184 mg/dL — ABNORMAL HIGH (ref 65–99)
Glucose-Capillary: 165 mg/dL — ABNORMAL HIGH (ref 65–99)

## 2017-08-14 LAB — PHENYTOIN LEVEL, TOTAL: PHENYTOIN LVL: 22.4 ug/mL — AB (ref 10.0–20.0)

## 2017-08-14 LAB — BASIC METABOLIC PANEL
Anion gap: 13 (ref 5–15)
BUN: 48 mg/dL — AB (ref 6–20)
CO2: 18 mmol/L — ABNORMAL LOW (ref 22–32)
Calcium: 8.3 mg/dL — ABNORMAL LOW (ref 8.9–10.3)
Chloride: 115 mmol/L — ABNORMAL HIGH (ref 101–111)
Creatinine, Ser: 1.25 mg/dL — ABNORMAL HIGH (ref 0.44–1.00)
GFR calc Af Amer: 53 mL/min — ABNORMAL LOW (ref >60)
GFR, EST NON AFRICAN AMERICAN: 46 mL/min — AB (ref >60)
GLUCOSE: 184 mg/dL — AB (ref 65–99)
POTASSIUM: 4.1 mmol/L (ref 3.5–5.1)
SODIUM: 146 mmol/L — AB (ref 135–145)

## 2017-08-14 MED ORDER — VITAMIN B-1 100 MG PO TABS
100.0000 mg | ORAL_TABLET | Freq: Every day | ORAL | Status: DC
  Administered 2017-08-15 – 2017-09-14 (×30): 100 mg
  Filled 2017-08-14 (×31): qty 1

## 2017-08-14 MED ORDER — IPRATROPIUM-ALBUTEROL 0.5-2.5 (3) MG/3ML IN SOLN
3.0000 mL | Freq: Four times a day (QID) | RESPIRATORY_TRACT | Status: DC
  Administered 2017-08-14 – 2017-08-21 (×29): 3 mL via RESPIRATORY_TRACT
  Filled 2017-08-14 (×30): qty 3

## 2017-08-14 MED ORDER — AMLODIPINE 1 MG/ML ORAL SUSPENSION
10.0000 mg | Freq: Every day | ORAL | Status: DC
  Filled 2017-08-14: qty 10

## 2017-08-14 MED ORDER — ORAL CARE MOUTH RINSE
15.0000 mL | OROMUCOSAL | Status: DC
  Administered 2017-08-14 – 2017-08-15 (×9): 15 mL via OROMUCOSAL

## 2017-08-14 MED ORDER — ALBUTEROL SULFATE (2.5 MG/3ML) 0.083% IN NEBU
INHALATION_SOLUTION | RESPIRATORY_TRACT | Status: AC
  Filled 2017-08-14: qty 3

## 2017-08-14 MED ORDER — AMLODIPINE BESYLATE 10 MG PO TABS
10.0000 mg | ORAL_TABLET | Freq: Every day | ORAL | Status: DC
  Administered 2017-08-14 – 2017-09-14 (×31): 10 mg
  Filled 2017-08-14 (×34): qty 1

## 2017-08-14 MED ORDER — ENALAPRIL MALEATE 10 MG PO TABS
10.0000 mg | ORAL_TABLET | Freq: Every day | ORAL | Status: DC
  Administered 2017-08-14 – 2017-08-15 (×2): 10 mg via ORAL
  Filled 2017-08-14 (×3): qty 1

## 2017-08-14 MED ORDER — ALBUTEROL SULFATE (2.5 MG/3ML) 0.083% IN NEBU: 2.5000 mg | INHALATION_SOLUTION | RESPIRATORY_TRACT | Status: DC | PRN

## 2017-08-14 NOTE — Progress Notes (Signed)
PULMONARY / CRITICAL CARE MEDICINE   Name: [name] MRN: [id number] DOB: 03/07/1956    ADMISSION DATE:  08/02/2017 CONSULTATION DATE:  08/02/2017  CHIEF COMPLAINT:  Found down  HISTORY OF PRESENT ILLNESS:   62 y/o female with a history of left thalamic hemorrhage previously who was admitted with seizure activity.     SUBJECTIVE:  No acute events   VITAL SIGNS: BP (!) 169/78   Pulse 92   Temp 99.9 F (37.7 C)   Resp (!) 28   Ht 5\' 5"  (1.651 m)   Wt 201 lb 4.5 oz (91.3 kg)   SpO2 100%   BMI 33.49 kg/m   HEMODYNAMICS:    VENTILATOR SETTINGS: Vent Mode: PRVC FiO2 (%):  [30 %] 30 % Set Rate:  [24 bmp] 24 bmp Vt Set:  [500 mL] 500 mL PEEP:  [5 cmH20] 5 cmH20 Plateau Pressure:  [18 cmH20-29 cmH20] 18 cmH20  INTAKE / OUTPUT: I/O last 3 completed shifts: In: 5352.3 [I.V.:1415.3; Other:275; NG/GT:2850; IV Piggyback:812] Out: 6213 [Urine:3285; Stool:210]  PHYSICAL EXAMINATION:  General:  In bed on vent HENT: NCAT ETT in place PULM: wheezing B, vent supported breathing CV: RRR, no mgr GI: BS+, soft, nontender MSK: normal bulk and tone Neuro: sedated on vent    LABS:  BMET Recent Labs  Lab 08/12/17 0424 08/13/17 0548 08/14/17 0420  NA 149* 150* 146*  K 3.8 3.1* 4.1  CL 120* 120* 115*  CO2 19* 19* 18*  BUN 60* 51* 48*  CREATININE 1.44* 1.39* 1.25*  GLUCOSE 244* 156* 184*    Electrolytes Recent Labs  Lab 08/10/17 0922 08/11/17 0422 08/12/17 0424 08/13/17 0548 08/14/17 0420  CALCIUM 8.6* 8.3* 8.3* 8.2* 8.3*  MG 1.8 2.1 1.8  --   --   PHOS 1.6* 3.3 3.5  --   --     CBC Recent Labs  Lab 08/11/17 0422 08/12/17 0424 08/13/17 0548  WBC 15.9* 17.2* 17.4*  HGB 10.8* 10.1* 9.6*  HCT 33.2* 31.3* 30.4*  PLT 282 262 285    Coag's No results for input(s): APTT, INR in the last 168 hours.  Sepsis Markers Recent Labs  Lab 08/08/17 0952  PROCALCITON 1.02    ABG Recent Labs  Lab 08/08/17 0329 08/11/17 0330  PHART 7.293* 7.385   PCO2ART 39.0 31.9*  PO2ART 66.0* 75.5*    Liver Enzymes Recent Labs  Lab 08/08/17 0342  AST 24  ALT 12*  ALKPHOS 67  BILITOT 0.5  ALBUMIN 1.6*    Cardiac Enzymes No results for input(s): TROPONINI, PROBNP in the last 168 hours.  Glucose Recent Labs  Lab 08/13/17 1134 08/13/17 1533 08/13/17 2025 08/13/17 2331 08/14/17 0400 08/14/17 0732  GLUCAP 174* 192* 228* 282* 157* 170*    Imaging Dg Chest Port 1 View  Result Date: 08/14/2017 CLINICAL DATA:  Acute respiratory failure with hypoxemia. EXAM: PORTABLE CHEST 1 VIEW COMPARISON:  One-view chest x-ray 08/12/2017 FINDINGS: Heart is exaggerated by low lung volumes. The endotracheal tube terminates 5 cm above the carina, in satisfactory position. NG tube courses off the inferior border of the film. A left IJ line is stable. Interstitial edema and bibasilar atelectasis is stable. IMPRESSION: 1. The endotracheal tube has been pulled back and now terminates 5 cm above the carina, in satisfactory position. 2. Support apparatus is otherwise stable. 3. Stable edema and bibasilar atelectasis. 4. Low lung volumes. Electronically Signed   By: San Morelle M.D.   On: 08/14/2017 06:57     STUDIES:  EEG  1/24>>>This is an abnormal EEG.There is evidence of moderate generalized slowing of brain activity as well as severe focal slowing in the left temporal lobe. The patient is not in non-convulsive status epilepticus. 2D echo 1/25>>> EF 94-07%, grade 2 diastolic dysfunction  MRI brain 1/25>>> 1. No acute intracranial abnormality. 2. 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 3. Remote hemorrhage in the left thalamus and left pons. 4. Age advanced atrophy. 5. Fluid in the nasopharynx and paranasal sinuses is likely secondary to intubation.  CULTURES: Blood Cx 1/30>>> Respiratory 1/30>>>Psuedomonas   ANTIBIOTICS: Vancomycin 1/30>>> 1/31 Zosyn 1/30>>> 2/5  LINES/TUBES: CVC 1/24>>> ETT 1/24>>> NG/OG  1/24>>>    DISCUSSION: 62 y/o female with a history of stroke, cocaine abuse here with status epilepticus.  MRI brain over weekend showed watershed infarct.    ASSESSMENT / PLAN:  PULMONARY A: Acute respiratory failure with hypoxemia HCAP Acute Pulm edema wheezing P:   Full mechanical vent support VAP prevention Daily WUA/SBT Add duoneb scheduled  CARDIOVASCULAR A:  HTN Grade 2 diastolic dysfunction, chronic diastolic heart failure Cocaine abuse with positive urine drug screen P:  Tele Increase amlodipine Continue labetalol Add enalapril  RENAL A:   Hypernatremia > improved hypokalemia P:   Monitor BMET and UOP Replace electrolytes as needed Continue free water via tube Hold lasix for now with hypernatremia  GASTROINTESTINAL A:   No acute issues P:   Continue tube feeding Continue pantoprazole  HEMATOLOGIC A:   Anemia, no bleeding P:  Monitor for bleeding  INFECTIOUS A:   Pseudomonas Pneumonia P:   Stop zosyn today  ENDOCRINE A:   Hyperglycemia  P:   SSI  NEUROLOGIC A:   Watershed infarct Acute encephalopathy Status epilepticus P:   RASS goal 0 to -1 Fentanyl for vent synchrony Hold propofol Anti-epileptics per neuro Revisit prognosis 2/6   FAMILY  - Updates: none bedside  - Inter-disciplinary family meet or Palliative Care meeting due by:  day 7  My cc time 31  b  minutes  Roselie Awkward, MD [place] [facility] Pager: [phone] Cell: [phone] After 3pm or if no response, call [phone]    08/14/2017, 7:41 AM

## 2017-08-14 NOTE — Procedures (Signed)
LTM-EEG Report  HISTORY: Continuous video-EEG monitoring performed for5year old withstatus epilepticus. ACQUISITION: International 10-20 system for electrode placement; 18 channels with additional eyes linked to ipsilateral ears and EKG. Additional T1-T2 electrodes were used. Continuous video recording obtained.  EEG NUMBER: MEDICATIONS: Day 1:TPM, PHT, LEV, LCM Day 2: TPM, PHT, LEV, LCM Day 3: TPM, PHT, LEV, LCM  DAY #1: from 12212/2/19 to 0730 08/12/17 BACKGROUND: An overall medium voltage continuous recording withpoorspontaneous variability and reactivity. Thebackground consisted ofmedium voltagedelta-theta activity bilaterally with sparse superimposed faster frequencies. State changes were present, however no clearstage II sleep architecturewas seen. EPILEPTIFORM/PERIODIC ACTIVITY: There werefrequent high voltage focalspikes with overriding fast activityin the leftanterior temporalregion(F7 maximal), often in a quasi-periodic 0.5hz  pattern. These discharges did not have ictal evolution. SEIZURES: none EVENTS: none  DAY #2: from 07302/3/19 to 0730 08/13/17 BACKGROUND: An overall medium voltage continuous recording withpoorspontaneous variability and reactivity. Thebackground consisted ofmedium voltagedelta-theta activity bilaterally with sparse superimposed faster frequencies. State changes were present, however no clearstage II sleep architecturewas seen. EPILEPTIFORM/PERIODIC ACTIVITY: There werefrequent high voltage focalspikes with overriding fast activityin the leftanterior temporalregion(F7 maximal), often in a quasi-periodic 0.5hz  pattern. On two occasions, short runs of 2-3hz  discharges lasting 5 seconds were seen. These discharges did not have ictal evolution. SEIZURES: none EVENTS: none  DAY #3: from 07302/4/19 to 0730 08/14/17 BACKGROUND: An overall medium voltage continuous recording withpoorspontaneous variability and  reactivity. Thebackground consisted ofmedium voltagedelta-theta activity bilaterally with sparse superimposed faster frequencies. State changes were present, however no clearstage II sleep architecturewas seen. EPILEPTIFORM/PERIODIC ACTIVITY: There werefrequent high voltage focalspikes with overriding fast activityin the leftanterior temporalregion(F7 maximal), often in a quasi-periodic 0.5hz  pattern. There were several short runs of 2-3hz  discharges lasting 5-7 seconds without ictal evolution. SEIZURES: none EVENTS: none  EKG: no significant arrhythmia  SUMMARY: This was an abnormal continuous video EEG due tofrequent focal epileptiform discharges in thelefttemporal region without ictal evolution. Epileptiform discharges were stable over the past 48 hours. There was also loss of normal background features indicative of an encephalopathy pattern. No seizures were seen.

## 2017-08-14 NOTE — Progress Notes (Signed)
LTM EEG D/C'd per Dr Leonel Ramsay. Skin breakdown is seen below Fp1, F3 and F4, RN aware

## 2017-08-14 NOTE — Progress Notes (Addendum)
Subjective: No changes  Exam: Vitals:   08/14/17 0800 08/14/17 0830  BP: (!) 165/74 (!) 184/75  Pulse: 94 95  Resp: (!) 29 (!) 30  Temp: 99.9 F (37.7 C) 99.3 F (37.4 C)  SpO2: 100% 100%   Gen: In bed, NAD Resp: non-labored breathing, no acute distress Abd: soft, nt  Neuro: MS: Does not open eyes or follow commands.  AJ:GOTLX, corneals intact.  Motor: Does nto respond to noxious stimulation.  Sensory: as above   Pertinent Labs: PHT 22.4  Impression: 62 yo  F with refractory status epilepticus due to seizure medication non-compliance, hyperosmolar state, and cocaine use. She was in subtle convulsive status epilepticus on arrival and presumably was in this state for some time as evidenced by the state in which she was found. She had not been seen for two days prior to being found.   Her seizures have been super refractory despite high doses of propofol. She was changed to pentobarbital on 1/27 due to continued seizures and reaching a maximum dose on propofol. Since weaning, no further seizures, but with continued evidence of severe cortical irritability, stable since stopping propofol.   Dr. Darnell Level) had a conversation describing likely outcomes with the daughter who indicates she would want to continue aggressive care(e.g. Trach/peg).  PHT level representing previous load will check level again in a couple of days.   Recommendations: 1) D/C EEG 2) Continue keppra 1g Q12H 3) continue vimpat 200mg  BID 4) continue dilantin 100mg  Q8H 5) continue topamax 200mg  BID  Roland Rack, MD [facility] [phone]  If 7pm- 7am, please page neurology on call as listed in [place].

## 2017-08-14 NOTE — Progress Notes (Signed)
Inpatient Diabetes Program Recommendations  AACE/ADA: New Consensus Statement on Inpatient Glycemic Control (2015)  Target Ranges:  Prepandial:   less than 140 mg/dL      Peak postprandial:   less than 180 mg/dL (1-2 hours)      Critically ill patients:  140 - 180 mg/dL   Results for REMAS, SOBEL (MRN [id number]) as of 08/14/2017 07:29  Ref. Range 08/13/2017 07:42 08/13/2017 11:34 08/13/2017 15:33 08/13/2017 20:25 08/13/2017 23:31 08/14/2017 04:00  Glucose-Capillary Latest Ref Range: 65 - 99 mg/dL 151 (H)    Novolog 10 units  Lantus 25 units 174 (H)  Novolog 10 units 192 (H)  Novolog 10 units 228 (H)  Novolog 13 units 282 (H)  Novolog 17 units 157 (H)  Novolog 10 units   Review of Glycemic Control  Current orders for Inpatient glycemic control: Lantus 25 units daily, Novolog 6 units Q4H for tube feeding, Novolog 0-20 units Q4H  Inpatient Diabetes Program Recommendations: Insulin - Basal: Please consider increasing Lantus to 30 units daily. Correction (SSI): Please consider changing Novolog correction using ICU glycemic control order set. Insulin -Tube Feeding Coverage: Please consider increasing tube feeding coverage to Novolog 8 units Q4H.  Thanks, Barnie Alderman, RN, MSN, CDE Diabetes Coordinator Inpatient Diabetes Program [phone] (Team Pager from 8am to 5pm)

## 2017-08-15 ENCOUNTER — Inpatient Hospital Stay (HOSPITAL_COMMUNITY): Payer: Medicaid Other

## 2017-08-15 DIAGNOSIS — R569 Unspecified convulsions: Secondary | ICD-10-CM

## 2017-08-15 LAB — GLUCOSE, CAPILLARY
GLUCOSE-CAPILLARY: 139 mg/dL — AB (ref 65–99)
GLUCOSE-CAPILLARY: 151 mg/dL — AB (ref 65–99)
GLUCOSE-CAPILLARY: 198 mg/dL — AB (ref 65–99)
Glucose-Capillary: 165 mg/dL — ABNORMAL HIGH (ref 65–99)
Glucose-Capillary: 184 mg/dL — ABNORMAL HIGH (ref 65–99)
Glucose-Capillary: 124 mg/dL — ABNORMAL HIGH (ref 65–99)

## 2017-08-15 LAB — PHENYTOIN LEVEL, TOTAL: Phenytoin Lvl: 3.1 ug/mL — ABNORMAL LOW (ref 10.0–20.0)

## 2017-08-15 LAB — BASIC METABOLIC PANEL
ANION GAP: 12 (ref 5–15)
BUN: 42 mg/dL — ABNORMAL HIGH (ref 6–20)
CALCIUM: 8.3 mg/dL — AB (ref 8.9–10.3)
CO2: 17 mmol/L — ABNORMAL LOW (ref 22–32)
Chloride: 115 mmol/L — ABNORMAL HIGH (ref 101–111)
Creatinine, Ser: 1.16 mg/dL — ABNORMAL HIGH (ref 0.44–1.00)
GFR calc Af Amer: 58 mL/min — ABNORMAL LOW (ref >60)
GFR calc non Af Amer: 50 mL/min — ABNORMAL LOW (ref >60)
Glucose, Bld: 200 mg/dL — ABNORMAL HIGH (ref 65–99)
Potassium: 3.3 mmol/L — ABNORMAL LOW (ref 3.5–5.1)
Sodium: 144 mmol/L (ref 135–145)

## 2017-08-15 LAB — TRIGLYCERIDES: Triglycerides: 192 mg/dL — ABNORMAL HIGH (ref <150)

## 2017-08-15 MED ORDER — CHLORHEXIDINE GLUCONATE 0.12% ORAL RINSE (MEDLINE KIT)
15.0000 mL | Freq: Two times a day (BID) | OROMUCOSAL | Status: DC
  Administered 2017-08-15 (×2): 15 mL via OROMUCOSAL

## 2017-08-15 MED ORDER — MIDAZOLAM HCL 2 MG/2ML IJ SOLN
INTRAMUSCULAR | Status: AC
  Administered 2017-08-15: 2 mg via INTRAVENOUS
  Filled 2017-08-15: qty 2

## 2017-08-15 MED ORDER — SODIUM CHLORIDE 0.9% FLUSH
10.0000 mL | Freq: Two times a day (BID) | INTRAVENOUS | Status: DC
  Administered 2017-08-15 – 2017-08-16 (×2): 10 mL
  Administered 2017-08-17: 20 mL
  Administered 2017-08-17 – 2017-08-24 (×10): 10 mL

## 2017-08-15 MED ORDER — POTASSIUM CHLORIDE 20 MEQ/15ML (10%) PO SOLN
20.0000 meq | ORAL | Status: AC
  Administered 2017-08-15 (×2): 20 meq
  Filled 2017-08-15 (×2): qty 15

## 2017-08-15 MED ORDER — ROCURONIUM BROMIDE 50 MG/5ML IV SOLN
50.0000 mg | Freq: Once | INTRAVENOUS | Status: AC
  Administered 2017-08-15: 50 mg via INTRAVENOUS

## 2017-08-15 MED ORDER — FENTANYL CITRATE (PF) 100 MCG/2ML IJ SOLN
100.0000 ug | Freq: Once | INTRAMUSCULAR | Status: AC
  Administered 2017-08-15: 100 ug via INTRAVENOUS

## 2017-08-15 MED ORDER — HYDRALAZINE HCL 20 MG/ML IJ SOLN: 10.0000 mg | Freq: Three times a day (TID) | INTRAMUSCULAR | Status: DC

## 2017-08-15 MED ORDER — POTASSIUM CHLORIDE 20 MEQ/15ML (10%) PO SOLN
40.0000 meq | Freq: Two times a day (BID) | ORAL | Status: AC
  Administered 2017-08-15 (×2): 40 meq via ORAL
  Filled 2017-08-15 (×3): qty 30

## 2017-08-15 MED ORDER — MIDAZOLAM HCL 2 MG/2ML IJ SOLN
2.0000 mg | Freq: Once | INTRAMUSCULAR | Status: AC
  Administered 2017-08-15: 2 mg via INTRAVENOUS

## 2017-08-15 MED ORDER — FREE WATER
300.0000 mL | Freq: Four times a day (QID) | Status: DC
  Administered 2017-08-15 – 2017-08-16 (×3): 300 mL

## 2017-08-15 MED ORDER — MIDAZOLAM HCL 2 MG/2ML IJ SOLN
4.0000 mg | Freq: Once | INTRAMUSCULAR | Status: AC
  Administered 2017-08-15: 4 mg via INTRAVENOUS

## 2017-08-15 MED ORDER — FENTANYL CITRATE (PF) 100 MCG/2ML IJ SOLN: 100.0000 ug | Freq: Once | INTRAMUSCULAR | Status: AC

## 2017-08-15 MED ORDER — MIDAZOLAM HCL 2 MG/2ML IJ SOLN
INTRAMUSCULAR | Status: AC
  Administered 2017-08-15: 4 mg via INTRAVENOUS
  Filled 2017-08-15: qty 4

## 2017-08-15 MED ORDER — IOPAMIDOL (ISOVUE-300) INJECTION 61%
INTRAVENOUS | Status: AC
  Administered 2017-08-15: 22:00:00
  Filled 2017-08-15: qty 30

## 2017-08-15 MED ORDER — FENTANYL CITRATE (PF) 100 MCG/2ML IJ SOLN
INTRAMUSCULAR | Status: AC
  Filled 2017-08-15: qty 2

## 2017-08-15 MED ORDER — CHLORHEXIDINE GLUCONATE CLOTH 2 % EX PADS
6.0000 | MEDICATED_PAD | Freq: Every day | CUTANEOUS | Status: DC
  Administered 2017-08-16 – 2017-08-31 (×16): 6 via TOPICAL

## 2017-08-15 MED ORDER — ORAL CARE MOUTH RINSE
15.0000 mL | Freq: Four times a day (QID) | OROMUCOSAL | Status: DC
  Administered 2017-08-15 – 2017-08-16 (×6): 15 mL via OROMUCOSAL

## 2017-08-15 MED ORDER — HYDRALAZINE HCL 10 MG PO TABS
10.0000 mg | ORAL_TABLET | Freq: Three times a day (TID) | ORAL | Status: DC
  Administered 2017-08-15 (×3): 10 mg
  Filled 2017-08-15 (×4): qty 1

## 2017-08-15 MED ORDER — SODIUM CHLORIDE 0.9% FLUSH
10.0000 mL | INTRAVENOUS | Status: DC | PRN
  Administered 2017-08-19 – 2017-09-10 (×5): 10 mL
  Filled 2017-08-15 (×5): qty 40

## 2017-08-15 MED ORDER — ETOMIDATE 2 MG/ML IV SOLN
10.0000 mg | Freq: Once | INTRAVENOUS | Status: AC
  Administered 2017-08-15: 10 mg via INTRAVENOUS

## 2017-08-15 NOTE — Progress Notes (Signed)
PCCM Video Bronchoscopy Procedure Note  The patient was informed of the risks (including but not limited to bleeding, infection, respiratory failure, lung injury, tooth/oral injury) and benefits of the procedure and gave consent, see chart.  Indication: visualization for tracheostomy placement  Post Procedure Diagnosis: acute respiratory failure with hypoxemia  Location: [facility]  Condition pre procedure: critically ill, on vent  Medications for procedure: versed, fentanyl, rocuronium  Procedure description: The bronchoscope was introduced through the endotracheal tube and passed to the bilateral lungs to the level of the subsegmental bronchi throughout the tracheobronchial tree.  Airway exam revealed acutely inflamed and edematous trachea, thick bloody secretions which were not occluding the airway.  The distal airways were normal in appearance.  Procedures performed: none  Specimens sent: none  Condition post procedure: critically ill on vent  EBL: none from bronchoscopy  Complications: non immediate  Roselie Awkward, MD [place] [facility] Pager: [phone] Cell: [phone] After 3pm or if no response, call [phone]

## 2017-08-15 NOTE — Procedures (Signed)
Percutaneous Tracheostomy Placement  Consent from family.  Patient sedated, paralyzed and position.  Placed on 100% FiO2 and RR matched.  Area cleaned and draped.  Lidocaine/epi injected.  Skin incision done followed by blunt dissection.  Trachea palpated then punctured, catheter passed and visualized bronchoscopically.  Wire placed and visualized.  Catheter removed.  Airway then crushed and dilated.  Size 6 cuffed shiley trach placed and visualized bronchoscopically well above carina.  Good volume returns.  Patient tolerated the procedure well without complications.  Minimal blood loss.  CXR ordered and pending.  Wesam G. Yacoub, M.D. [facility]. Pager: [phone]. After hours pager: [phone]. 

## 2017-08-15 NOTE — Progress Notes (Signed)
Talked to patient's daughter this morning after rounds about her mother's prognosis. I explained to patient's daughter that patient is improving slowly from a neurologic standpoint but could possibly have deficits from her stroke. She is currently intubated but will likely require prolonged medical and nursing care after her discharge from ICU. Given patient current medical condition, she will very likely require a trach and PEG going forward. Daughter confirmed that she discussed those options with another doctor last week and would like to proceed when medically appropriate. Daughter ask for updates prior to procedures which I assured her we will do.   Marjie Skiff, MD [place], PGY-2

## 2017-08-15 NOTE — Progress Notes (Signed)
Subjective: Slightly more awake.   Exam: Vitals:   08/15/17 0600 08/15/17 0615  BP: (!) 142/130 (!) 159/76  Pulse: 93 90  Resp: (!) 35 (!) 25  Temp: 99.9 F (37.7 C) 100 F (37.8 C)  SpO2: 100% 100%   Gen: In bed, NAD Resp: non-labored breathing, no acute distress Abd: soft, nt  Neuro: MS: Opens eyes to mild stimulation/voice. She doe snot follow commands, does not fixate.   KJ:ZPHXT, eyes are slightly dysconjugate, but centered around  midine and cross midline with doll's eye.  Motor: withdraws vs flexion  to noxious stimulation in bilateral lower extremities and left upper extremity. No movement RUE.  Sensory: as above   Pertinent Labs: GFR 58  Impression: 62 yo  F with refractory status epilepticus due to seizure medication non-compliance, hyperosmolar state, and cocaine use. She was in subtle convulsive status epilepticus on arrival and presumably was in this state for some time as evidenced by the condition in which she was found. She had not been seen for two days prior to being found.   Her seizures were super refractory despite high doses of propofol. She was changed to pentobarbital on 1/27 due to continued seizures and reaching a maximum dose on propofol. Since weaning pentobarbital , no further seizures.   Dr. Darnell Level) had a conversation describing likely outcomes with the daughter who indicates she would want to continue aggressive care(e.g. Trach/peg).  Recommendations: 1) recheck dilantin 2) Continue keppra 1g Q12H 3) continue vimpat 200mg  BID 4) continue dilantin 100mg  Q8H 5) continue topamax 200mg  BID  Roland Rack, MD [facility] [phone]  If 7pm- 7am, please page neurology on call as listed in [place].

## 2017-08-15 NOTE — Progress Notes (Signed)
Peripherally Inserted Central Catheter/Midline Placement  The IV Nurse has discussed with the patient and/or persons authorized to consent for the patient, the purpose of this procedure and the potential benefits and risks involved with this procedure.  The benefits include less needle sticks, lab draws from the catheter, and the patient may be discharged home with the catheter. Risks include, but not limited to, infection, bleeding, blood clot (thrombus formation), and puncture of an artery; nerve damage and irregular heartbeat and possibility to perform a PICC exchange if needed/ordered by physician.  Alternatives to this procedure were also discussed.  Bard Power PICC patient education guide, fact sheet on infection prevention and patient information card has been provided to patient /or left at bedside.    PICC/Midline Placement Documentation  PICC Double Lumen 08/15/17 PICC Right Brachial 40 cm 0 cm (Active)  Indication for Insertion or Continuance of Line Prolonged intravenous therapies 08/15/2017 12:40 PM  Exposed Catheter (cm) 0 cm 08/15/2017 12:40 PM  Site Assessment Clean;Dry;Intact 08/15/2017 12:40 PM  Lumen #1 Status Flushed;Saline locked;Blood return noted 08/15/2017 12:40 PM  Lumen #2 Status Flushed;Saline locked;Blood return noted 08/15/2017 12:40 PM  Dressing Type Transparent;Securing device 08/15/2017 12:40 PM  Dressing Status Clean;Dry;Intact;Antimicrobial disc in place 08/15/2017 12:40 PM  Dressing Change Due 08/22/17 08/15/2017 12:40 PM   R DL PICC placed by [facility] RN    Frances Maywood 08/15/2017, 12:44 PM

## 2017-08-15 NOTE — Progress Notes (Signed)
Patient transported on vent from 38M-4 to CT and back without complication.

## 2017-08-15 NOTE — Progress Notes (Signed)
Nutrition Follow-up  DOCUMENTATION CODES:   Obesity unspecified  INTERVENTION:   Continue:  Vital High Protein at 40 ml/h (960 ml per day)  Pro-stat 30 ml BID  Provides 1160 kcal, 114 gm protein, 803 ml free water daily; total 2 L free water daily with current free water flushes  NUTRITION DIAGNOSIS:   Inadequate oral intake related to inability to eat as evidenced by NPO status.  Ongoing  GOAL:   Provide needs based on ASPEN/SCCM guidelines  Met with TF  MONITOR:   Vent status, TF tolerance, Labs, I & O's  ASSESSMENT:   62 yo female with PMH of DM, HTN, alcohol and cocaine abuse, L thalamic hemorrhage, insomnia disorder who was admitted on 1/24 after being found down; clinical seizures in the ED; required intubation in ED.  Discussed patient in ICU rounds and with RN today. Plans for family meeting to decide on potential trach & PEG placement. Patient is currently receiving Vital High Protein via OGT at 40 ml/h (960 ml/day) with Prostat 30 ml BID to provide 1160 kcals, 114 gm protein, 803 ml free water daily. Tolerating TF well. Patient is currently intubated on ventilator support MV: 18.1 L/min Temp (24hrs), Avg:99.5 F (37.5 C), Min:98.8 F (37.1 C), Max:100.3 F (37.9 C)   Labs reviewed. Potassium 3.3 (L), triglycerides 192 (H) CBG's: [phone] Medications reviewed and include Colace, KCl, thiamine. Receiving 300 ml free water every 6 hours via OGT.   Diet Order:  Diet NPO time specified Seizure precautions  EDUCATION NEEDS:   No education needs have been identified at this time  Skin:  Skin Assessment: Skin Integrity Issues: Skin Integrity Issues:: Stage II, Diabetic Ulcer Stage II: coccyx Diabetic Ulcer: R leg  Last BM:  2/5 (rectal tube)  Height:   Ht Readings from Last 1 Encounters:  08/02/17 _0  (1.651 m)    Weight:   Wt Readings from Last 1 Encounters:  08/15/17 203 lb 11.3 oz (92.4 kg)    Ideal Body Weight:  56.8 kg  BMI:   Body mass index is 33.9 kg/m.  Estimated Nutritional Needs:   Kcal:  [phone]  Protein:  114 gm  Fluid:  >1.5 L   Molli Barrows, RD, LDN, [place] Pager [phone] After Hours Pager [phone]

## 2017-08-15 NOTE — Progress Notes (Signed)
PULMONARY / CRITICAL CARE MEDICINE   Name: [name] MRN: [id number] DOB: 12-06-1955    ADMISSION DATE:  08/02/2017 CONSULTATION DATE:  08/02/2017  CHIEF COMPLAINT:  Found down  HISTORY OF PRESENT ILLNESS:   62 y/o female with a history of left thalamic hemorrhage previously who was admitted with seizure activity.     SUBJECTIVE:  No acute events   VITAL SIGNS: BP (!) 159/76   Pulse 90   Temp 100 F (37.8 C)   Resp (!) 25   Ht 5\' 5"  (1.651 m)   Wt 203 lb 11.3 oz (92.4 kg)   SpO2 100%   BMI 33.90 kg/m   HEMODYNAMICS:    VENTILATOR SETTINGS: Vent Mode: PRVC FiO2 (%):  [30 %] 30 % Set Rate:  [24 bmp] 24 bmp Vt Set:  [500 mL] 500 mL PEEP:  [5 cmH20] 5 cmH20 Plateau Pressure:  [18 cmH20-21 cmH20] 20 cmH20  INTAKE / OUTPUT: I/O last 3 completed shifts: In: 2703 [I.V.:1420; Other:150; JK/KX:3818; IV Piggyback:100] Out: 2993 [Urine:3570; Stool:310]  PHYSICAL EXAMINATION:  General:  In bed on vent HENT: NCAT ETT in place PULM: wheezing B, vent supported breathing CV: RRR, no mgr GI: BS+, soft, nontender MSK: normal bulk and tone Neuro: sedated on vent    LABS:  BMET Recent Labs  Lab 08/13/17 0548 08/14/17 0420 08/15/17 0412  NA 150* 146* 144  K 3.1* 4.1 3.3*  CL 120* 115* 115*  CO2 19* 18* 17*  BUN 51* 48* 42*  CREATININE 1.39* 1.25* 1.16*  GLUCOSE 156* 184* 200*    Electrolytes Recent Labs  Lab 08/10/17 7169 08/11/17 0422 08/12/17 0424 08/13/17 0548 08/14/17 0420 08/15/17 0412  CALCIUM 8.6* 8.3* 8.3* 8.2* 8.3* 8.3*  MG 1.8 2.1 1.8  --   --   --   PHOS 1.6* 3.3 3.5  --   --   --     CBC Recent Labs  Lab 08/11/17 0422 08/12/17 0424 08/13/17 0548  WBC 15.9* 17.2* 17.4*  HGB 10.8* 10.1* 9.6*  HCT 33.2* 31.3* 30.4*  PLT 282 262 285    Coag's No results for input(s): APTT, INR in the last 168 hours.  Sepsis Markers Recent Labs  Lab 08/08/17 0952  PROCALCITON 1.02    ABG Recent Labs  Lab 08/11/17 0330  PHART  7.385  PCO2ART 31.9*  PO2ART 75.5*    Liver Enzymes No results for input(s): AST, ALT, ALKPHOS, BILITOT, ALBUMIN in the last 168 hours.  Cardiac Enzymes No results for input(s): TROPONINI, PROBNP in the last 168 hours.  Glucose Recent Labs  Lab 08/14/17 1132 08/14/17 1528 08/14/17 1931 08/14/17 2359 08/15/17 0359 08/15/17 0737  GLUCAP 184* 165* 144* 177* 165* 184*    Imaging No results found.   STUDIES:  EEG 1/24>>>This is an abnormal EEG.There is evidence of moderate generalized slowing of brain activity as well as severe focal slowing in the left temporal lobe. The patient is not in non-convulsive status epilepticus. 2D echo 1/25>>> EF 67-89%, grade 2 diastolic dysfunction  MRI brain 1/25>>> 1. No acute intracranial abnormality. 2. 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 3. Remote hemorrhage in the left thalamus and left pons. 4. Age advanced atrophy. 5. Fluid in the nasopharynx and paranasal sinuses is likely secondary to intubation.  CULTURES: Blood Cx 1/30>>> Respiratory 1/30>>>Psuedomonas   ANTIBIOTICS: Vancomycin 1/30>>> 1/31 Zosyn 1/30>>> 2/5  LINES/TUBES: CVC 1/24>>> ETT 1/24>>> NG/OG 1/24>>>    DISCUSSION: 62 y/o female with a history of stroke, cocaine abuse  here with status epilepticus.  MRI brain over weekend showed watershed infarct.    ASSESSMENT / PLAN:  PULMONARY A: Acute respiratory failure with hypoxemia HCAP Acute Pulm edema wheezing P:   Full mechanical vent support VAP prevention Daily WUA/SBT Continue Duoneb  CARDIOVASCULAR A:  HTN Grade 2 diastolic dysfunction, chronic diastolic heart failure Cocaine abuse with positive urine drug screen P:  Tele Continue amlodipine Continue labetalol Continue enalapril  RENAL A:   Hypernatremia > improving hypokalemia P:   Monitor BMET and UOP Replace electrolytes as needed Continue free water via tube Hold lasix for now with  hypernatremia  GASTROINTESTINAL A:   No acute issues P:   Continue tube feeding Continue pantoprazole  HEMATOLOGIC A:   Anemia, no bleeding P:  Monitor for bleeding  INFECTIOUS A:   Pseudomonas Pneumonia P:   Stop zosyn today  ENDOCRINE A:   Hyperglycemia  P:   SSI  NEUROLOGIC A:   Watershed infarct Acute encephalopathy Status epilepticus P:   RASS goal 0 to -1 Fentanyl for vent synchrony Hold propofol Anti-epileptics per neuro Revisit prognosis 2/6   FAMILY  - Updates: none bedside  - Inter-disciplinary family meet or Palliative Care meeting due by:  day [place], MD [place], PGY-2  08/15/2017, 8:26 AM

## 2017-08-15 NOTE — Progress Notes (Signed)
[facility] ADULT ICU REPLACEMENT PROTOCOL FOR AM LAB REPLACEMENT ONLY  The patient does apply for the [facility] Adult ICU Electrolyte Replacment Protocol based on the criteria listed below:   1. Is GFR >/= 40 ml/min? Yes.    Patient's GFR today is 50 2. Is urine output >/= 0.5 ml/kg/hr for the last 6 hours? Yes.   Patient's UOP is 1.1 ml/kg/hr 3. Is BUN < 60 mg/dL? Yes.    Patient's BUN today is 42 4. Abnormal electrolyte(s):K3.3 5. Ordered repletion with: per protocol 6. If a panic level lab has been reported, has the CCM MD in charge been notified? Yes.  .   Physician:  S Sommer,MD  [place] Clock 08/15/2017 6:01 AM

## 2017-08-15 NOTE — Progress Notes (Signed)
[place] Progress Note Patient Name: [name] DOB: [birth year] MRN: [id number]   Date of Service  08/15/2017  HPI/Events of Note  RN asking about CT scan  eICU Interventions  Attempt inserting NG for contrast     Intervention Category Intermediate Interventions: Diagnostic test evaluation  [place] V. Loura Pitt 08/15/2017, 8:20 PM

## 2017-08-16 ENCOUNTER — Inpatient Hospital Stay (HOSPITAL_COMMUNITY): Payer: Medicaid Other

## 2017-08-16 ENCOUNTER — Encounter (HOSPITAL_COMMUNITY): Payer: Self-pay | Admitting: General Surgery

## 2017-08-16 LAB — GLUCOSE, CAPILLARY
GLUCOSE-CAPILLARY: 122 mg/dL — AB (ref 65–99)
GLUCOSE-CAPILLARY: 143 mg/dL — AB (ref 65–99)
GLUCOSE-CAPILLARY: 159 mg/dL — AB (ref 65–99)
GLUCOSE-CAPILLARY: 238 mg/dL — AB (ref 65–99)
Glucose-Capillary: 121 mg/dL — ABNORMAL HIGH (ref 65–99)
Glucose-Capillary: 213 mg/dL — ABNORMAL HIGH (ref 65–99)

## 2017-08-16 LAB — BASIC METABOLIC PANEL
ANION GAP: 11 (ref 5–15)
BUN: 37 mg/dL — ABNORMAL HIGH (ref 6–20)
CHLORIDE: 116 mmol/L — AB (ref 101–111)
CO2: 17 mmol/L — AB (ref 22–32)
Calcium: 8.4 mg/dL — ABNORMAL LOW (ref 8.9–10.3)
Creatinine, Ser: 1.07 mg/dL — ABNORMAL HIGH (ref 0.44–1.00)
GFR calc non Af Amer: 55 mL/min — ABNORMAL LOW (ref >60)
Glucose, Bld: 112 mg/dL — ABNORMAL HIGH (ref 65–99)
Potassium: 3.2 mmol/L — ABNORMAL LOW (ref 3.5–5.1)
Sodium: 144 mmol/L (ref 135–145)

## 2017-08-16 LAB — CBC
HCT: 30.3 % — ABNORMAL LOW (ref 36.0–46.0)
Hemoglobin: 9.9 g/dL — ABNORMAL LOW (ref 12.0–15.0)
MCH: 31 pg (ref 26.0–34.0)
MCHC: 32.7 g/dL (ref 30.0–36.0)
MCV: 95 fL (ref 78.0–100.0)
PLATELETS: 359 10*3/uL (ref 150–400)
RBC: 3.19 MIL/uL — ABNORMAL LOW (ref 3.87–5.11)
RDW: 15.8 % — AB (ref 11.5–15.5)
WBC: 14.1 10*3/uL — ABNORMAL HIGH (ref 4.0–10.5)

## 2017-08-16 LAB — ALBUMIN: ALBUMIN: 1.4 g/dL — AB (ref 3.5–5.0)

## 2017-08-16 LAB — PHENYTOIN LEVEL, TOTAL

## 2017-08-16 MED ORDER — SODIUM CHLORIDE 0.9 % IV SOLN
5.0000 mg/kg | Freq: Once | INTRAVENOUS | Status: AC
  Administered 2017-08-16: 437.5 mg via INTRAVENOUS
  Filled 2017-08-16: qty 8.75

## 2017-08-16 MED ORDER — ENALAPRIL MALEATE 20 MG PO TABS
20.0000 mg | ORAL_TABLET | Freq: Every day | ORAL | Status: DC
  Administered 2017-08-16 – 2017-08-19 (×3): 20 mg via ORAL
  Filled 2017-08-16 (×5): qty 1

## 2017-08-16 MED ORDER — CHLORHEXIDINE GLUCONATE 0.12% ORAL RINSE (MEDLINE KIT)
15.0000 mL | Freq: Two times a day (BID) | OROMUCOSAL | Status: DC
  Administered 2017-08-16 – 2017-08-24 (×17): 15 mL via OROMUCOSAL

## 2017-08-16 MED ORDER — CEFAZOLIN SODIUM-DEXTROSE 2-4 GM/100ML-% IV SOLN: 2.0000 g | INTRAVENOUS | Status: DC

## 2017-08-16 MED ORDER — HYDRALAZINE HCL 50 MG PO TABS
50.0000 mg | ORAL_TABLET | Freq: Three times a day (TID) | ORAL | Status: DC
  Administered 2017-08-16 – 2017-08-25 (×24): 50 mg
  Filled 2017-08-16 (×30): qty 1

## 2017-08-16 MED ORDER — LABETALOL HCL 5 MG/ML IV SOLN
10.0000 mg | INTRAVENOUS | Status: DC | PRN
  Administered 2017-08-16 – 2017-09-03 (×26): 10 mg via INTRAVENOUS
  Filled 2017-08-16 (×17): qty 4

## 2017-08-16 MED ORDER — LABETALOL HCL 200 MG PO TABS
400.0000 mg | ORAL_TABLET | Freq: Three times a day (TID) | ORAL | Status: DC
  Administered 2017-08-16 – 2017-08-19 (×10): 400 mg via ORAL
  Filled 2017-08-16 (×13): qty 2

## 2017-08-16 MED ORDER — SODIUM CHLORIDE 0.9 % IV SOLN
125.0000 mg | Freq: Three times a day (TID) | INTRAVENOUS | Status: DC
  Administered 2017-08-16 (×2): 125 mg via INTRAVENOUS
  Filled 2017-08-16 (×4): qty 2.5

## 2017-08-16 MED ORDER — FOSPHENYTOIN SODIUM 500 MG PE/10ML IJ SOLN
5.0000 mg/kg | Freq: Once | INTRAMUSCULAR | Status: AC
  Administered 2017-08-16: 437.5 mg via INTRAVENOUS
  Filled 2017-08-16: qty 8.75

## 2017-08-16 MED ORDER — HEPARIN SODIUM (PORCINE) 5000 UNIT/ML IJ SOLN: 5000.0000 [IU] | Freq: Three times a day (TID) | INTRAMUSCULAR | Status: DC

## 2017-08-16 MED ORDER — CEFAZOLIN SODIUM-DEXTROSE 2-4 GM/100ML-% IV SOLN
2.0000 g | INTRAVENOUS | Status: AC
  Administered 2017-08-17: 2 g via INTRAVENOUS
  Filled 2017-08-16: qty 100

## 2017-08-16 MED ORDER — HEPARIN SODIUM (PORCINE) 5000 UNIT/ML IJ SOLN
5000.0000 [IU] | Freq: Three times a day (TID) | INTRAMUSCULAR | Status: DC
  Administered 2017-08-17 – 2017-08-30 (×39): 5000 [IU] via SUBCUTANEOUS
  Filled 2017-08-16 (×39): qty 1

## 2017-08-16 MED ORDER — ORAL CARE MOUTH RINSE
15.0000 mL | OROMUCOSAL | Status: DC
  Administered 2017-08-16 – 2017-08-22 (×54): 15 mL via OROMUCOSAL

## 2017-08-16 MED ORDER — FREE WATER
200.0000 mL | Freq: Three times a day (TID) | Status: DC
  Administered 2017-08-16 – 2017-08-18 (×4): 200 mL

## 2017-08-16 NOTE — Progress Notes (Signed)
Subjective: Slightly more awake.   Exam: Vitals:   08/16/17 1100 08/16/17 1200  BP: (!) 163/72   Pulse: 95 96  Resp: (!) 24 (!) 28  Temp: 100 F (37.8 C) 100.2 F (37.9 C)  SpO2: 99% 97%   Gen: In bed, NAD Resp: non-labored breathing, no acute distress Abd: soft, nt  Neuro: MS: Opens eyes to voice. She does not follow commands, does not fixate.   YB:FXOVA, eyes are slightly dysconjugate, but centered around  midine and cross midline with doll's eye.  Motor: withdraws vs flexion  to noxious stimulation in bilateral lower extremities and left upper extremity. No movement RUE.  Sensory: as above   Pertinent Labs: GFR 58  Impression: 62 yo  F with refractory status epilepticus due to seizure medication non-compliance, hyperosmolar state, and cocaine use. She was in subtle convulsive status epilepticus on arrival and presumably was in this state for some time as evidenced by the condition in which she was found. She had not been seen for two days prior to being found.   Her seizures were super refractory despite high doses of propofol. She was changed to pentobarbital on 1/27 due to continued seizures and reaching a maximum dose on propofol. Since weaning pentobarbital , no further seizures.   Dr. Darnell Level) had a conversation describing likely outcomes with the daughter who indicates she would want to continue aggressive care(e.g. Trach/peg).  Recommendations: 1) pharmacy consult for Dilantin dosing, already received 5 mg/kg today, but the level ended up being less than 2.5, so I would favor doing a 10 mg/kg bolus. 2) Continue keppra 1g Q12H 3) continue vimpat 200mg  BID 4) continue topamax 200mg  BID  Roland Rack, MD [facility] [phone]  If 7pm- 7am, please page neurology on call as listed in [place].

## 2017-08-16 NOTE — Consult Note (Signed)
Chief Complaint: dyspagia  Referring Physician:Dr. Simonne Maffucci  Supervising Physician: Markus Daft  Patient Status: [facility] - In-pt  HPI: [name] is a 62 y.o. female who was admitted secondary to seizure activity.  She has a history of a left thalamic hemorrhage, cocaine abuse, DM, and HTN.  Unfortunately, she has developed respiratory failure and required intubation and now tracheostomy placement.  She did have a pseudomonas PNA that was treated.  Her WBC is trending down with minimal fevers.  Due to her respiratory status, she is unable to be fed enterally and a g-tube has been requested.  Past Medical History:  Past Medical History:  Diagnosis Date  . Cocaine abuse, continuous ([place])   . Diabetes mellitus    Hb A1C = 12.6 on 05/15/11, managed on Novolog 70/30, 35 U qam, 25 U qpm  . Hypertension    poorly controlled  . Insomnia disorder   . Shortness of breath     Past Surgical History:  Past Surgical History:  Procedure Laterality Date  . ANKLE FRACTURE SURGERY  2007    Family History:  Family History  Problem Relation Age of Onset  . Stroke Father   . Diabetes Sister   . Diabetes Brother     Social History:  reports that she has been smoking cigarettes.  She has a 37.50 pack-year smoking history. she has never used smokeless tobacco. She reports that she drinks about 4.8 oz of alcohol per week. She reports that she uses drugs. Drug: Cocaine.  Allergies: No Known Allergies  Medications: Medications reviewed in epic  ROS: unable to obtain secondary to intubated and unresponsive.  Mallampati Score: MD Evaluation Airway: Other (comments) Airway comments: tracheostomy in place Heart: WNL Abdomen: WNL Other Pertinent Findings: coarse breath sounds ASA  Classification: 4 Mallampati/Airway Score: (trach in place)  Physical Exam: BP (!) 163/72   Pulse 96   Temp 100.2 F (37.9 C)   Resp (!) 28   Ht '5\' 5"'$  (1.651 m)   Wt 192 lb 14.4 oz (87.5 kg)    SpO2 97%   BMI 32.10 kg/m  Body mass index is 32.1 kg/m. General: unresponsive to voice, black female who is laying in bed  HEENT: head is normocephalic, atraumatic.  Sclera are noninjected.  Pupils are dilated, but respond to light.  Ears and nose without any masses or lesions.  Mouth is close.  Trach in place connected to vent. Heart: regular, rate, and rhythm.  Normal s1,s2. No obvious murmurs, gallops, or rubs noted.  Palpable radial pulses bilaterally Lungs: coarse breath sounds, on vent. Abd: soft, ND, +BS, no masses, hernias, or organomegaly Psych: essentially unresponsive on vent.   Labs: Results for orders placed or performed during the hospital encounter of 08/02/17 (from the past 48 hour(s))  Glucose, capillary     Status: Abnormal   Collection Time: 08/14/17  3:28 PM  Result Value Ref Range   Glucose-Capillary 165 (H) 65 - 99 mg/dL   Comment 1 Capillary Specimen    Comment 2 Notify RN   Glucose, capillary     Status: Abnormal   Collection Time: 08/14/17  7:31 PM  Result Value Ref Range   Glucose-Capillary 144 (H) 65 - 99 mg/dL   Comment 1 Capillary Specimen    Comment 2 Notify RN   Glucose, capillary     Status: Abnormal   Collection Time: 08/14/17 11:59 PM  Result Value Ref Range   Glucose-Capillary 177 (H) 65 - 99 mg/dL  Comment 1 Capillary Specimen    Comment 2 Notify RN   Glucose, capillary     Status: Abnormal   Collection Time: 08/15/17  3:59 AM  Result Value Ref Range   Glucose-Capillary 165 (H) 65 - 99 mg/dL   Comment 1 Capillary Specimen    Comment 2 Notify RN   Basic metabolic panel     Status: Abnormal   Collection Time: 08/15/17  4:12 AM  Result Value Ref Range   Sodium 144 135 - 145 mmol/L   Potassium 3.3 (L) 3.5 - 5.1 mmol/L   Chloride 115 (H) 101 - 111 mmol/L   CO2 17 (L) 22 - 32 mmol/L   Glucose, Bld 200 (H) 65 - 99 mg/dL   BUN 42 (H) 6 - 20 mg/dL   Creatinine, Ser 1.16 (H) 0.44 - 1.00 mg/dL   Calcium 8.3 (L) 8.9 - 10.3 mg/dL   GFR calc  non Af Amer 50 (L) >60 mL/min   GFR calc Af Amer 58 (L) >60 mL/min    Comment: (NOTE) The eGFR has been calculated using the CKD EPI equation. This calculation has not been validated in all clinical situations. eGFR's persistently <60 mL/min signify possible Chronic Kidney Disease.    Anion gap 12 5 - 15    Comment: Performed at [facility] [address], [place], [place] [address]  Glucose, capillary     Status: Abnormal   Collection Time: 08/15/17  7:37 AM  Result Value Ref Range   Glucose-Capillary 184 (H) 65 - 99 mg/dL   Comment 1 Capillary Specimen    Comment 2 Notify RN   Triglycerides     Status: Abnormal   Collection Time: 08/15/17  9:35 AM  Result Value Ref Range   Triglycerides 192 (H) <150 mg/dL    Comment: Performed at [facility] [address], [place], [place] [address]  Phenytoin level, total     Status: Abnormal   Collection Time: 08/15/17 10:30 AM  Result Value Ref Range   Phenytoin Lvl 3.1 (L) 10.0 - 20.0 ug/mL    Comment: Performed at [facility] [address] [place], [place] [address]  Glucose, capillary     Status: Abnormal   Collection Time: 08/15/17 11:15 AM  Result Value Ref Range   Glucose-Capillary 139 (H) 65 - 99 mg/dL   Comment 1 Capillary Specimen    Comment 2 Notify RN   Glucose, capillary     Status: Abnormal   Collection Time: 08/15/17  3:45 PM  Result Value Ref Range   Glucose-Capillary 198 (H) 65 - 99 mg/dL   Comment 1 Capillary Specimen   Glucose, capillary     Status: Abnormal   Collection Time: 08/15/17  7:22 PM  Result Value Ref Range   Glucose-Capillary 124 (H) 65 - 99 mg/dL   Comment 1 Capillary Specimen    Comment 2 Notify RN   Glucose, capillary     Status: Abnormal   Collection Time: 08/15/17 11:55 PM  Result Value Ref Range   Glucose-Capillary 151 (H) 65 - 99 mg/dL   Comment 1 Capillary Specimen    Comment 2 Notify RN   Glucose, capillary     Status: Abnormal   Collection Time: 08/16/17  3:38 AM   Result Value Ref Range   Glucose-Capillary 121 (H) 65 - 99 mg/dL   Comment 1 Capillary Specimen    Comment 2 Notify RN   BMET in AM     Status:  Abnormal   Collection Time: 08/16/17  6:03 AM  Result Value Ref Range   Sodium 144 135 - 145 mmol/L   Potassium 3.2 (L) 3.5 - 5.1 mmol/L   Chloride 116 (H) 101 - 111 mmol/L   CO2 17 (L) 22 - 32 mmol/L   Glucose, Bld 112 (H) 65 - 99 mg/dL   BUN 37 (H) 6 - 20 mg/dL   Creatinine, Ser 1.07 (H) 0.44 - 1.00 mg/dL   Calcium 8.4 (L) 8.9 - 10.3 mg/dL   GFR calc non Af Amer 55 (L) >60 mL/min   GFR calc Af Amer >60 >60 mL/min    Comment: (NOTE) The eGFR has been calculated using the CKD EPI equation. This calculation has not been validated in all clinical situations. eGFR's persistently <60 mL/min signify possible Chronic Kidney Disease.    Anion gap 11 5 - 15    Comment: Performed at [facility] [address] [place], [place] [address]  Glucose, capillary     Status: Abnormal   Collection Time: 08/16/17  7:36 AM  Result Value Ref Range   Glucose-Capillary 122 (H) 65 - 99 mg/dL   Comment 1 Capillary Specimen    Comment 2 Notify RN   CBC     Status: Abnormal   Collection Time: 08/16/17  8:29 AM  Result Value Ref Range   WBC 14.1 (H) 4.0 - 10.5 K/uL   RBC 3.19 (L) 3.87 - 5.11 MIL/uL   Hemoglobin 9.9 (L) 12.0 - 15.0 g/dL   HCT 30.3 (L) 36.0 - 46.0 %   MCV 95.0 78.0 - 100.0 fL   MCH 31.0 26.0 - 34.0 pg   MCHC 32.7 30.0 - 36.0 g/dL   RDW 15.8 (H) 11.5 - 15.5 %   Platelets 359 150 - 400 K/uL    Comment: Performed at [facility], [place] [address], [place], [place] [address]  Albumin     Status: Abnormal   Collection Time: 08/16/17  8:29 AM  Result Value Ref Range   Albumin 1.4 (L) 3.5 - 5.0 g/dL    Comment: Performed at [facility] [address] [place], [place] [address]  Phenytoin level, total     Status: Abnormal   Collection Time: 08/16/17  8:29 AM  Result Value Ref Range   Phenytoin Lvl <2.5 (L) 10.0 - 20.0  ug/mL    Comment: Performed at [facility] [address], [place], [place] [address]  Glucose, capillary     Status: Abnormal   Collection Time: 08/16/17 11:35 AM  Result Value Ref Range   Glucose-Capillary 159 (H) 65 - 99 mg/dL   Comment 1 Capillary Specimen    Comment 2 Notify RN     Imaging: Ct Abdomen Wo Contrast  Result Date: 08/16/2017 CLINICAL DATA:  Cerebral infarct, respiratory failure and seizure. Evaluation of anatomy prior to possible percutaneous gastrostomy tube placement. EXAM: CT ABDOMEN WITHOUT CONTRAST TECHNIQUE: Multidetector CT imaging of the abdomen was performed following the standard protocol without IV contrast. COMPARISON:  05/10/2017 FINDINGS: Lower chest: Bilateral lower lobe atelectasis/consolidation present. Hepatobiliary: The left lobe of the liver does cross the midline. The gallbladder appears contracted. No gross biliary ductal dilatation. Pancreas: Unenhanced appearance of the pancreas is unremarkable. Spleen: Normal in size. Adrenals/Urinary Tract: No adrenal mass.  No hydronephrosis. Stomach/Bowel: The upper stomach is largely posterior to the left lobe of the liver. The transverse portion of the stomach lies inferior to the liver. This portion of  the stomach does lie superior to the transverse colon but in the decompressed state is in very close proximity to the colon. With air insufflation, puncture of the stomach without traversing the colon should be possible under fluoroscopy. There is no evidence ileus or small bowel obstruction. No free air is identified. Vascular/Lymphatic: The abdominal aorta is calcified without evidence of aneurysm. No enlarged lymph nodes are identified in the abdomen. Other: No visualized hernias or ascites. There is some degree of body wall edema/anasarca. No focal fluid collections identified. Musculoskeletal: No acute or significant osseous findings. IMPRESSION: 1. The left lobe of the liver crosses the midline and does lie  anterior to the proximal stomach. The transverse portion of the stomach does lie immediately superior to the transverse colon when decompressed. With air insufflation, the stomach should be able to be approached safely for gastrostomy tube placement. 2. Bilateral lower lobe atelectasis/consolidation 3. Aortic atherosclerosis without aneurysm. 4. Body wall edema/anasarca. Electronically Signed   By: Aletta Edouard M.D.   On: 08/16/2017 07:52   Dg Abd 1 View  Result Date: 08/15/2017 CLINICAL DATA:  62 y/o  F; nasogastric tube placement. EXAM: ABDOMEN - 1 VIEW COMPARISON:  05/10/2017 CT abdomen and pelvis. FINDINGS: Left hemiabdomen is included within the field of view. Enteric tube tip projects over the gastric body. Normal visible bowel gas pattern. Lumbar spine levocurvature. IMPRESSION: Enteric tube tip projects over gastric body. Electronically Signed   By: Kristine Garbe M.D.   On: 08/15/2017 21:07   Dg Chest Port 1 View  Result Date: 08/16/2017 CLINICAL DATA:  Tracheostomy EXAM: PORTABLE CHEST 1 VIEW COMPARISON:  08/15/2017 FINDINGS: Tracheostomy remains in good position and unchanged. Central venous catheter tip in the lower SVC also unchanged. No pneumothorax. Interval placement of NG tube with the tip not visualized. NG tube passes through the stomach. Bibasilar airspace disease similar to the prior study.  No effusion. IMPRESSION: Interval placement of NG tube.  Tracheostomy unchanged in position. Bibasilar airspace disease similar to yesterday. Electronically Signed   By: Franchot Gallo M.D.   On: 08/16/2017 07:04   Dg Chest Port 1 View  Result Date: 08/15/2017 CLINICAL DATA:  Acute respiratory failure EXAM: PORTABLE CHEST 1 VIEW COMPARISON:  08/15/2017 FINDINGS: Interval placement tracheostomy tube which projects over the mid trachea. No pneumothorax. Left central line is unchanged. Interval placement of right PICC line with the tip in the SVC. Interval removal of the NG tube. Bibasilar  airspace opacities are again noted. No visible effusions. Heart is mildly enlarged. IMPRESSION: Interval tracheostomy placement.  No pneumothorax. Right PICC line tip in the SVC. Stable bibasilar atelectasis or infiltrates. Electronically Signed   By: Rolm Baptise M.D.   On: 08/15/2017 15:38   Dg Chest Port 1 View  Result Date: 08/15/2017 CLINICAL DATA:  Acute respiratory failure with hypoxemia. EXAM: PORTABLE CHEST 1 VIEW COMPARISON:  08/14/2017 FINDINGS: Endotracheal tube terminates just below the clavicular heads, well above the carina. Enteric tube courses into the upper abdomen with side hole well beyond the GE junction and tip not imaged. Left jugular catheter terminates over the lower SVC. The cardiac silhouette appears mildly enlarged. Bibasilar airspace opacities are stable to slightly increased on the right and unchanged on the left. No sizable pleural effusion or pneumothorax is identified. IMPRESSION: Bibasilar infiltrates, slightly increased on the right. Electronically Signed   By: Logan Bores M.D.   On: 08/15/2017 09:21    Assessment/Plan 1. VDRF. Dysphagia  Plan to proceed with g-tube placement.  Her labs and vitals have been reviewed.  She has been treated for her PNA.  Her WBC is trending down to 14K today.  Temp was only as high as 99.1.  Other labs reviewed and are ok to proceed.  Her daughter in [place] has been called for consent.  Risks and benefits discussed with the patient's daughter including, but not limited to the need for a barium enema during the procedure, bleeding, infection, peritonitis, or damage to adjacent structures.  All of the patient's daughter's questions were answered, patient's daughter is agreeable to proceed. Consent signed and in chart.  Thank you for this interesting consult.  I greatly enjoyed meeting [name] and look forward to participating in their care.  A copy of this report was sent to the requesting provider on this date.  Electronically  Signed: Henreitta Cea 08/16/2017, 12:27 PM   I spent a total of 40 Minutes   in face to face in clinical consultation, greater than 50% of which was counseling/coordinating care for VDRF, dysphagia

## 2017-08-16 NOTE — Progress Notes (Signed)
CSW received call from RN expressing that pt's daughter gave permission for further medical treatment and also informed RN that daughter wanted pt placed as close to [place] (of not in [place]) as possible. CSW explained to daughter that CSW has access to limited facilities at this time- but one facility may have a building in [place]. Pt's daughter expressed understanding of this. CSW also informed daughter that if pt is unable to be placed within [place] at the time of discharge then daughter could potentially work with staff at whichever facility and see if they are able to place pt closer to daughter. At this time daughter did not express any further concerns or questions to CSW. CSW remains available for support at this time.     Virgie Dad Todd Jelinski, MSW, [facility] Clinical Social Worker [phone]

## 2017-08-16 NOTE — Progress Notes (Addendum)
MEDICATION RELATED CONSULT NOTE - Follow-up  Pharmacy Consult for phenytoin Indication: Seizures  No Known Allergies  Patient Measurements: Height: 5\' 5"  (165.1 cm) Weight: 192 lb 14.4 oz (87.5 kg) IBW/kg (Calculated) : 57  Vital Signs: Temp: 100 F (37.8 C) (02/07 1100) BP: 163/72 (02/07 1100) Pulse Rate: 95 (02/07 1100) Intake/Output from previous day: 02/06 0701 - 02/07 0700 In: 1955 [I.V.:300; NG/GT:1655] Out: 0998 [Urine:2170; Stool:325] Intake/Output from this shift: No intake/output data recorded.  Labs: Recent Labs    08/14/17 0420 08/15/17 0412 08/16/17 0603 08/16/17 0829  WBC  --   --   --  14.1*  HGB  --   --   --  9.9*  HCT  --   --   --  30.3*  PLT  --   --   --  359  CREATININE 1.25* 1.16* 1.07*  --   ALBUMIN  --   --   --  1.4*   Estimated Creatinine Clearance: 61.1 mL/min (A) (by C-G formula based on SCr of 1.07 mg/dL (H)).   Microbiology: Recent Results (from the past 720 hour(s))  MRSA PCR Screening     Status: None   Collection Time: 08/02/17 11:00 PM  Result Value Ref Range Status   MRSA by PCR NEGATIVE NEGATIVE Final    Comment:        The GeneXpert MRSA Assay (FDA approved for NASAL specimens only), is one component of a comprehensive MRSA colonization surveillance program. It is not intended to diagnose MRSA infection nor to guide or monitor treatment for MRSA infections.   Culture, respiratory (NON-Expectorated)     Status: None   Collection Time: 08/08/17 10:06 AM  Result Value Ref Range Status   Specimen Description TRACHEAL ASPIRATE  Final   Special Requests NONE  Final   Gram Stain   Final    MODERATE WBC PRESENT, PREDOMINANTLY PMN FEW GRAM NEGATIVE RODS FEW GRAM POSITIVE COCCI IN PAIRS    Culture   Final    ABUNDANT PSEUDOMONAS AERUGINOSA MODERATE GROUP B STREP(S.AGALACTIAE)ISOLATED TESTING AGAINST S. AGALACTIAE NOT ROUTINELY PERFORMED DUE TO PREDICTABILITY OF AMP/PEN/VAN SUSCEPTIBILITY. Performed at [facility], [place] [address], [place], [place] [address]    Report Status 08/10/2017 FINAL  Final   Organism ID, Bacteria PSEUDOMONAS AERUGINOSA  Final      Susceptibility   Pseudomonas aeruginosa - MIC*    CEFTAZIDIME 4 SENSITIVE Sensitive     CIPROFLOXACIN <=0.25 SENSITIVE Sensitive     GENTAMICIN <=1 SENSITIVE Sensitive     IMIPENEM 2 SENSITIVE Sensitive     PIP/TAZO 8 SENSITIVE Sensitive     CEFEPIME 2 SENSITIVE Sensitive     * ABUNDANT PSEUDOMONAS AERUGINOSA  Culture, blood (routine x 2)     Status: None   Collection Time: 08/08/17 11:00 AM  Result Value Ref Range Status   Specimen Description BLOOD RIGHT ANTECUBITAL  Final   Special Requests IN PEDIATRIC BOTTLE Blood Culture adequate volume  Final   Culture   Final    NO GROWTH 5 DAYS Performed at [facility], [place] [address], [place], [place] [address]    Report Status 08/13/2017 FINAL  Final  Culture, blood (routine x 2)     Status: None   Collection Time: 08/08/17 11:07 AM  Result Value Ref Range Status   Specimen Description BLOOD RIGHT ANTECUBITAL  Final   Special Requests IN PEDIATRIC BOTTLE Blood Culture adequate volume  Final   Culture   Final  NO GROWTH 5 DAYS Performed at [facility], [place] [address], [place], [place] [address]    Report Status 08/13/2017 FINAL  Final    Medical History: Past Medical History:  Diagnosis Date  . Cocaine abuse, continuous ([place])   . Diabetes mellitus    Hb A1C = 12.6 on 05/15/11, managed on Novolog 70/30, 35 U qam, 25 U qpm  . Hypertension    poorly controlled  . Insomnia disorder   . Shortness of breath     Medications:  Medications Prior to Admission  Medication Sig Dispense Refill Last Dose  . amLODipine (NORVASC) 10 MG tablet Take 1 tablet (10 mg total) daily by mouth. 30 tablet 1 LF 07-30-17 at 30DS  . collagenase (SANTYL) ointment Apply topically daily. Apply Santyl to right leg wound Q day, then cover with moist gauze and dry gauze and kerlex 15 g 0 LF  07-19-17 at 30DS  . furosemide (LASIX) 20 MG tablet Take 20 mg by mouth daily.  2 LF 07-31-17 at 30DS  . gabapentin (NEURONTIN) 300 MG capsule Take 1 capsule (300 mg total) 3 (three) times daily by mouth. 90 capsule 1 LF 07-31-17 at 30DS  . insulin aspart protamine- aspart (NOVOLOG MIX 70/30) (70-30) 100 UNIT/ML injection Inject 0.2 mLs (20 Units total) into the skin 2 (two) times daily with a meal.   LF 07-31-17 at 30DS  . levETIRAcetam (KEPPRA) 500 MG tablet Take 1 tablet (500 mg total) 2 (two) times daily by mouth. 60 tablet 1 LF 05-16-17 at 30DS  . metoprolol tartrate (LOPRESSOR) 25 MG tablet Take 25 mg by mouth 2 (two) times daily.  0 LF 07-30-17 at 30DS  . acetaminophen (TYLENOL) 325 MG tablet Take 2 tablets (650 mg total) every 6 (six) hours as needed by mouth for mild pain, fever or headache. 40 tablet 0 UNK  . atorvastatin (LIPITOR) 40 MG tablet Take 1 tablet (40 mg total) daily at 6 PM by mouth. 30 tablet 1 LF 05-16-17 at 30DS  . cyclobenzaprine (FLEXERIL) 10 MG tablet Take 1 tablet (10 mg total) at bedtime by mouth. 30 tablet 0 LF 05-16-17 at 30DS  . folic acid (FOLVITE) 1 MG tablet Take 1 tablet (1 mg total) by mouth daily. 30 tablet 0 LF 06-04-17 at 30DS  . Multiple Vitamin (MULTIVITAMIN WITH MINERALS) TABS tablet Take 1 tablet by mouth daily.   UNK  . nystatin cream (MYCOSTATIN) Apply topically 2 (two) times daily. 30 g 0 LF 06-04-17 at 14DS  . oxybutynin (DITROPAN-XL) 5 MG 24 hr tablet Take 1 tablet (5 mg total) at bedtime by mouth. 30 tablet 0 05-16-17 at 30DS  . pantoprazole (PROTONIX) 40 MG tablet Take 1 tablet (40 mg total) by mouth daily.   LF 05-16-17 at 30DS  . PROAIR HFA 108 (90 Base) MCG/ACT inhaler Inhale 2 puffs into the lungs 4 (four) times daily as needed for wheezing.  0 LF 10-08-16 at PRN  . thiamine 100 MG tablet Take 1 tablet (100 mg total) by mouth daily. 30 tablet 0 LF 06-04-17 at 30DS    Assessment: Pharmacy helping with phenytoin dosing in collaboration with neurology.    1/29: Phenytoin level was high, corrected level ~30 and Phenytoin was held. 1/30: Phenytoin random level corrected to ~20, Phenytoin was resumed at lower dose of 100mg  IV BID. 1/31: PM random Phenytoin level corrected to 21.9, no change 2/4: Phenytoin level low, corrects to 7.62 (but using old albumin of 1.6 from 1/31), this is a  5 day level. Patient loaded with Fosphenytoin 750 mg IV X 1 and increased to Phenytoin 100 mg IV TID  2/5: Phenytoin level 22.4 (corrected to 41.5) post-load 2/6: Phenytoin level 3.1 (Corrected to 5.7 using old albumin of 1.6 from 1/31)- 2 day level 2/7: Phenytoin level <2.5 (Corrected to <5.2 using new albumin of 1.4 today)- 3 day level  Goal of Therapy:  Phenytoin level 10-20 ug/mL  Plan:  Per Neurologist- Fosphenytoin 880 mg to re-load  Pharmacy will increase phenytoin to 125 mg IV TID  Monitor level tomorrow post-load. Will need steady state level in 5-7 days   Jimmy Footman, PharmD, BCPS PGY2 Infectious Diseases Pharmacy Resident Pager: [phone]  08/16/2017, 11:37 AM

## 2017-08-16 NOTE — Clinical Social Work Note (Signed)
Clinical Social Work Assessment  Patient Details  Name: [name] MRN: [id number] Date of Birth: 1956-03-03  Date of referral:  08/16/17               Reason for consult:  Facility Placement                Permission sought to share information with:  Family Supports Permission granted to share information::  Yes, Verbal Permission Granted  Name::     [name]   Agency::  family  Relationship::  daughter  Sport and exercise psychologist Information:  [name] [phone].  Housing/Transportation Living arrangements for the past 2 months:  Apartment(with elderly spouse) Source of Information:  Adult Children(pt's daughter. ) Patient Interpreter Needed:  None Criminal Activity/Legal Involvement Pertinent to Current Situation/Hospitalization:  No - Comment as needed Significant Relationships:  Adult Children(pt's daughter [name]) Lives with:  Significant Other Do you feel safe going back to the place where you live?  Yes(after needed therapy. ) Need for family participation in patient care:  Yes (Comment)  Care giving concerns:  CSw spoke with pt's daughter for this assessment as pt is intubated at this time. CSW was informed that at this time pt's daughter does not have any concerns, aside from wanting pt to be placed closer to her in [place] if possible.    Social Worker assessment / plan: CSW spoke with pt's daughter via phone. CSW was informed that pt is from home where pt was caring for pt's elderly spouse. CSW was informed that pt has limited support and that support is only from pt's daughter at this time. Pt';s daughter is understanding that pt may need out of state placement and is agreeable to this. Pt's daughter did request that if possible could pt be placed somewhere in [place]. CSW informed daughter that one facility does have different locations and one could potentially be in [place]. Pt's daughter expressed understanding at this time.   Employment status:  Other  (Comment)(unknown. ) Insurance information:  [place] PT Recommendations:  Not assessed at this time Information / Referral to community resources:  [place]  Patient/Family's Response to care:  Pt's daughter appeared to be understanding and and agreeable to plan of care at [facility] s time.   Patient/Family's Understanding of and Emotional Response to Diagnosis, Current Treatment, and Prognosis:  No further questions or concerns have been presented to CSW at this time.   Emotional Assessment Appearance:  Appears stated age Attitude/Demeanor/Rapport:  Unable to Assess Affect (typically observed):  Unable to Assess Orientation:  Fluctuating Orientation (Suspected and/or reported Sundowners)(unable to assess. ) Alcohol / Substance use:  Not Applicable Psych involvement (Current and /or in the community):  No (Comment)  Discharge Needs  Concerns to be addressed:  Care Coordination(pt's daughter want spt closer to her in [place]. ) Readmission within the last 30 days:  No Current discharge risk:  Lives alone, Dependent with Mobility Barriers to Discharge:  Continued Medical Work up   [employer], [place] 08/16/2017, 10:38 AM

## 2017-08-16 NOTE — Progress Notes (Signed)
eLink Physician-Brief Progress Note Patient Name: [name] DOB: 01/08/1956 MRN: [id number]   Date of Service  08/16/2017  HPI/Events of Note  BP high  eICU Interventions  Increase hydrallazine 50 q 8 Increase labetalol 10 prn q 2h     Intervention Category Intermediate Interventions: Hypertension - evaluation and management  [name] V. [name] 08/16/2017, 2:57 AM

## 2017-08-16 NOTE — Progress Notes (Addendum)
CSW spoke with pt's daughter [name] and she is agreeable to placement at this time. CSW explained the options for placement to Tiffany and she is aware that pt may be placed out of state if pt is still needing the vent at any time.   CSW will began the process once pt's trach has been in for 48 hours. CSW remains available for support at this time.     Virgie Dad Lakoda Mcanany, MSW, [place] [facility] Clinical Social Worker [phone]

## 2017-08-16 NOTE — Progress Notes (Signed)
PULMONARY / CRITICAL CARE MEDICINE   Name: [name] MRN: [id number] DOB: 08/28/1955    ADMISSION DATE:  08/02/2017 CONSULTATION DATE:  08/02/2017  CHIEF COMPLAINT:  Found down  HISTORY OF PRESENT ILLNESS:   62 y/o female with a history of left thalamic hemorrhage previously who was admitted with seizure activity.     SUBJECTIVE:  Patient continue to have elevated BP overnight. Afebrile after tracheostomy procedure yesterday afternoon. New PICC line. Patient had CT scan in anticipation of PEG placement by IR.   VITAL SIGNS: BP (!) 158/69   Pulse 91   Temp 99.1 F (37.3 C)   Resp (!) 31   Ht 5\' 5"  (1.651 m)   Wt 192 lb 14.4 oz (87.5 kg)   SpO2 99%   BMI 32.10 kg/m   HEMODYNAMICS:    VENTILATOR SETTINGS: Vent Mode: PRVC FiO2 (%):  [30 %] 30 % Set Rate:  [24 bmp] 24 bmp Vt Set:  [500 mL] 500 mL PEEP:  [5 cmH20] 5 cmH20 Plateau Pressure:  [22 cmH20-29 cmH20] 22 cmH20  INTAKE / OUTPUT: I/O last 3 completed shifts: In: 6010 [I.V.:870; NG/GT:3165] Out: 3840 [XNATF:5732; Stool:100]  PHYSICAL EXAMINATION:  General:  In bed on vent HENT: NCAT ETT in place PULM: wheezing B, vent supported breathing, trach in place  CV: RRR, no mgr GI: BS+, soft, nontender MSK: normal bulk and tone Neuro: sedated on vent    LABS:  BMET Recent Labs  Lab 08/14/17 0420 08/15/17 0412 08/16/17 0603  NA 146* 144 144  K 4.1 3.3* 3.2*  CL 115* 115* 116*  CO2 18* 17* 17*  BUN 48* 42* 37*  CREATININE 1.25* 1.16* 1.07*  GLUCOSE 184* 200* 112*    Electrolytes Recent Labs  Lab 08/10/17 0922 08/11/17 0422 08/12/17 0424  08/14/17 0420 08/15/17 0412 08/16/17 0603  CALCIUM 8.6* 8.3* 8.3*   < > 8.3* 8.3* 8.4*  MG 1.8 2.1 1.8  --   --   --   --   PHOS 1.6* 3.3 3.5  --   --   --   --    < > = values in this interval not displayed.    CBC Recent Labs  Lab 08/11/17 0422 08/12/17 0424 08/13/17 0548  WBC 15.9* 17.2* 17.4*  HGB 10.8* 10.1* 9.6*  HCT 33.2* 31.3* 30.4*   PLT 282 262 285    Coag's No results for input(s): APTT, INR in the last 168 hours.  Sepsis Markers No results for input(s): LATICACIDVEN, PROCALCITON, O2SATVEN in the last 168 hours.  ABG Recent Labs  Lab 08/11/17 0330  PHART 7.385  PCO2ART 31.9*  PO2ART 75.5*    Liver Enzymes No results for input(s): AST, ALT, ALKPHOS, BILITOT, ALBUMIN in the last 168 hours.  Cardiac Enzymes No results for input(s): TROPONINI, PROBNP in the last 168 hours.  Glucose Recent Labs  Lab 08/15/17 0737 08/15/17 1115 08/15/17 1545 08/15/17 1922 08/15/17 2355 08/16/17 0338  GLUCAP 184* 139* 198* 124* 151* 121*    Imaging Dg Abd 1 View  Result Date: 08/15/2017 CLINICAL DATA:  62 y/o  F; nasogastric tube placement. EXAM: ABDOMEN - 1 VIEW COMPARISON:  05/10/2017 CT abdomen and pelvis. FINDINGS: Left hemiabdomen is included within the field of view. Enteric tube tip projects over the gastric body. Normal visible bowel gas pattern. Lumbar spine levocurvature. IMPRESSION: Enteric tube tip projects over gastric body. Electronically Signed   By: Kristine Garbe M.D.   On: 08/15/2017 21:07   Dg Chest [facility] [address]  Result Date: 08/15/2017 CLINICAL DATA:  Acute respiratory failure EXAM: PORTABLE CHEST 1 VIEW COMPARISON:  08/15/2017 FINDINGS: Interval placement tracheostomy tube which projects over the mid trachea. No pneumothorax. Left central line is unchanged. Interval placement of right PICC line with the tip in the SVC. Interval removal of the NG tube. Bibasilar airspace opacities are again noted. No visible effusions. Heart is mildly enlarged. IMPRESSION: Interval tracheostomy placement.  No pneumothorax. Right PICC line tip in the SVC. Stable bibasilar atelectasis or infiltrates. Electronically Signed   By: Rolm Baptise M.D.   On: 08/15/2017 15:38     STUDIES:  EEG 1/24>>>This is an abnormal EEG.There is evidence of moderate generalized slowing of brain activity as well as severe focal  slowing in the left temporal lobe. The patient is not in non-convulsive status epilepticus. 2D echo 1/25>>> EF 06-30%, grade 2 diastolic dysfunction  MRI brain 1/25>>> 1. No acute intracranial abnormality. 2. 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 3. Remote hemorrhage in the left thalamus and left pons. 4. Age advanced atrophy. 5. Fluid in the nasopharynx and paranasal sinuses is likely secondary to intubation.  CULTURES: Blood Cx 1/30>>> Respiratory 1/30>>>Psuedomonas   ANTIBIOTICS: Vancomycin 1/30>>> 1/31 Zosyn 1/30>>> 2/5  LINES/TUBES: CVC 1/24>>> ETT 1/24>>> NG/OG 1/24>>> Tracheostomy 2/6>> PICC 2/6 >>    DISCUSSION: 62 y/o female with a history of stroke, cocaine abuse here with status epilepticus.  MRI brain over weekend showed watershed infarct. Patient will have tracheostomy and PEG placed by the end of this week in anticipation of post ICU care.  ASSESSMENT / PLAN:  PULMONARY A: Acute respiratory failure with hypoxemia-Improving HCAP-resolving Acute Pulm edema-Stable Tracheostomy  P:   Full mechanical vent support VAP prevention Daily WUA/SBT Continue Duoneb  CARDIOVASCULAR A:  HTN- Patient continue to have elevated BP despite aggressive regimen. Patient on 300 ml of water q6 for hypernatremia which is resolving. Likely contributing to increase BP in the setting of recent stroke. Grade 2 diastolic dysfunction, chronic diastolic heart failure Cocaine abuse with positive urine drug screen  P:  Consider decreasing free water volume Continue amlodipine 10 mg daily Continue labetalol 300 mg tid- 10 mg PRN Continue enalapril 10 mg- consider increasing to 10 mg bid  Hydralazine increased from 10 mg to 50 mg overnight  RENAL A:   Hypernatremia > resolving Hypokalemia 3.3>>3.2 P:   Monitor BMET and UOP Replace electrolytes as needed Continue free water via tube Hold lasix for now with hypernatremia  GASTROINTESTINAL A:   No acute  issues P:   Continue tube feeding Continue pantoprazole  HEMATOLOGIC A:   Anemia, no bleeding P:  Monitor for bleeding  INFECTIOUS A:   Pseudomonas Pneumonia P:   Zosyn D/ced   ENDOCRINE A:   Hyperglycemia  P:   SSI  NEUROLOGIC A:   Watershed infarct Acute encephalopathy Status epilepticus P:   RASS goal 0 to -1 Fentanyl for vent synchrony Hold propofol Anti-epileptics per neuro Revisit prognosis 2/6   FAMILY  - Updates: none bedside, discussed care with daughter on the phone who have reiterate their will to continue patient care as full code  - Inter-disciplinary family meet or Palliative Care meeting due by:  day [place], MD [place], PGY-2  08/16/2017, 6:53 AM

## 2017-08-17 ENCOUNTER — Inpatient Hospital Stay (HOSPITAL_COMMUNITY): Payer: Medicaid Other

## 2017-08-17 ENCOUNTER — Encounter (HOSPITAL_COMMUNITY): Payer: Self-pay | Admitting: Interventional Radiology

## 2017-08-17 DIAGNOSIS — I639 Cerebral infarction, unspecified: Secondary | ICD-10-CM

## 2017-08-17 HISTORY — PX: IR GASTROSTOMY TUBE MOD SED: IMG625

## 2017-08-17 LAB — PHENYTOIN LEVEL, TOTAL
Phenytoin Lvl: 4.2 ug/mL — ABNORMAL LOW (ref 10.0–20.0)
Phenytoin Lvl: 5.2 ug/mL — ABNORMAL LOW (ref 10.0–20.0)

## 2017-08-17 LAB — BASIC METABOLIC PANEL
ANION GAP: 14 (ref 5–15)
BUN: 34 mg/dL — ABNORMAL HIGH (ref 6–20)
CALCIUM: 8.3 mg/dL — AB (ref 8.9–10.3)
CO2: 15 mmol/L — AB (ref 22–32)
CREATININE: 1.2 mg/dL — AB (ref 0.44–1.00)
Chloride: 116 mmol/L — ABNORMAL HIGH (ref 101–111)
GFR, EST AFRICAN AMERICAN: 56 mL/min — AB (ref >60)
GFR, EST NON AFRICAN AMERICAN: 48 mL/min — AB (ref >60)
Glucose, Bld: 188 mg/dL — ABNORMAL HIGH (ref 65–99)
Potassium: 3 mmol/L — ABNORMAL LOW (ref 3.5–5.1)
SODIUM: 145 mmol/L (ref 135–145)

## 2017-08-17 LAB — GLUCOSE, CAPILLARY
GLUCOSE-CAPILLARY: 176 mg/dL — AB (ref 65–99)
GLUCOSE-CAPILLARY: 177 mg/dL — AB (ref 65–99)
GLUCOSE-CAPILLARY: 144 mg/dL — AB (ref 65–99)
Glucose-Capillary: 177 mg/dL — ABNORMAL HIGH (ref 65–99)
Glucose-Capillary: 152 mg/dL — ABNORMAL HIGH (ref 65–99)

## 2017-08-17 LAB — PROTIME-INR
INR: 1.64
Prothrombin Time: 19.3 seconds — ABNORMAL HIGH (ref 11.4–15.2)

## 2017-08-17 MED ORDER — CEFAZOLIN (ANCEF) 1 G IV SOLR: 1.0000 g | Freq: Once | INTRAVENOUS | Status: DC

## 2017-08-17 MED ORDER — GLUCAGON HCL RDNA (DIAGNOSTIC) 1 MG IJ SOLR
INTRAMUSCULAR | Status: AC
  Filled 2017-08-17: qty 1

## 2017-08-17 MED ORDER — HYDRALAZINE HCL 20 MG/ML IJ SOLN
10.0000 mg | Freq: Once | INTRAMUSCULAR | Status: AC
  Administered 2017-08-17: 10 mg via INTRAVENOUS

## 2017-08-17 MED ORDER — FOSPHENYTOIN SODIUM 500 MG PE/10ML IJ SOLN
300.0000 mg | Freq: Three times a day (TID) | INTRAMUSCULAR | Status: DC
  Administered 2017-08-17: 300 mg via INTRAVENOUS
  Filled 2017-08-17 (×2): qty 6
  Filled 2017-08-17: qty 2

## 2017-08-17 MED ORDER — FENTANYL CITRATE (PF) 100 MCG/2ML IJ SOLN
INTRAMUSCULAR | Status: AC
  Filled 2017-08-17: qty 4

## 2017-08-17 MED ORDER — LIDOCAINE HCL (PF) 1 % IJ SOLN
INTRAMUSCULAR | Status: AC | PRN
  Administered 2017-08-17: 8 mL

## 2017-08-17 MED ORDER — FENTANYL CITRATE (PF) 100 MCG/2ML IJ SOLN
INTRAMUSCULAR | Status: AC | PRN
  Administered 2017-08-17: 25 ug via INTRAVENOUS

## 2017-08-17 MED ORDER — SODIUM CHLORIDE 0.9 % IV SOLN
300.0000 mg | Freq: Three times a day (TID) | INTRAVENOUS | Status: DC
  Administered 2017-08-17 – 2017-08-18 (×2): 300 mg via INTRAVENOUS
  Filled 2017-08-17 (×3): qty 6

## 2017-08-17 MED ORDER — CEFAZOLIN SODIUM-DEXTROSE 2-4 GM/100ML-% IV SOLN
2.0000 g | Freq: Once | INTRAVENOUS | Status: DC
  Filled 2017-08-17: qty 100

## 2017-08-17 MED ORDER — CEFAZOLIN SODIUM-DEXTROSE 1-4 GM/50ML-% IV SOLN
1.0000 g | Freq: Once | INTRAVENOUS | Status: AC
  Administered 2017-08-17: 1 g via INTRAVENOUS

## 2017-08-17 MED ORDER — LIDOCAINE HCL 1 % IJ SOLN
INTRAMUSCULAR | Status: AC
  Administered 2017-08-17: 13:00:00
  Filled 2017-08-17: qty 20

## 2017-08-17 MED ORDER — SODIUM CHLORIDE 0.9 % IV SOLN
300.0000 mg | Freq: Once | INTRAVENOUS | Status: AC
  Administered 2017-08-17: 300 mg via INTRAVENOUS
  Filled 2017-08-17: qty 6

## 2017-08-17 MED ORDER — LABETALOL HCL 5 MG/ML IV SOLN
INTRAVENOUS | Status: AC
  Administered 2017-08-17: 13:00:00
  Filled 2017-08-17: qty 4

## 2017-08-17 MED ORDER — IOPAMIDOL (ISOVUE-300) INJECTION 61%
INTRAVENOUS | Status: AC
  Administered 2017-08-17: 15 mL
  Filled 2017-08-17: qty 50

## 2017-08-17 MED ORDER — CEFAZOLIN SODIUM-DEXTROSE 2-4 GM/100ML-% IV SOLN
INTRAVENOUS | Status: AC
  Filled 2017-08-17: qty 100

## 2017-08-17 MED ORDER — HYDRALAZINE HCL 20 MG/ML IJ SOLN
INTRAMUSCULAR | Status: AC
  Administered 2017-08-17: 13:00:00
  Filled 2017-08-17: qty 1

## 2017-08-17 MED ORDER — MIDAZOLAM HCL 2 MG/2ML IJ SOLN
INTRAMUSCULAR | Status: AC
  Filled 2017-08-17: qty 4

## 2017-08-17 MED ORDER — MIDAZOLAM HCL 2 MG/2ML IJ SOLN
INTRAMUSCULAR | Status: AC | PRN
  Administered 2017-08-17: 0.5 mg via INTRAVENOUS

## 2017-08-17 NOTE — Procedures (Signed)
Interventional Radiology Procedure Note  Procedure: Placement of percutaneous 20F pull-through gastrostomy tube. Complications: None Recommendations: - NPO except for sips and chips remainder of today and overnight - Maintain G-tube to LWS until tomorrow morning  - May advance diet as tolerated and begin using tube tomorrow morning  Signed,   Konni Kesinger S. Ambre Kobayashi, DO   

## 2017-08-17 NOTE — Sedation Documentation (Signed)
Patient is resting comfortably. 

## 2017-08-17 NOTE — Progress Notes (Signed)
Baseline unresponsive and does not move extremities

## 2017-08-17 NOTE — Progress Notes (Signed)
Patient was transported to IR & back to 2M04 without any complications.

## 2017-08-17 NOTE — Progress Notes (Signed)
PULMONARY / CRITICAL CARE MEDICINE   Name: [name] MRN: [id number] DOB: March 05, 1956    ADMISSION DATE:  08/02/2017 CONSULTATION DATE:  08/02/2017  CHIEF COMPLAINT:  Found down  HISTORY OF PRESENT ILLNESS:   62 y/o female with a history of left thalamic hemorrhage previously who was admitted with seizure activity.     SUBJECTIVE:  Patient continue to have elevated BP overnight. Afebrile after tracheostomy procedure yesterday afternoon. New PICC line. Patient had CT scan in anticipation of PEG placement by IR.   VITAL SIGNS: BP (!) 156/71   Pulse 87   Temp 98.6 F (37 C)   Resp (!) 24   Ht 5\' 5"  (1.651 m)   Wt 202 lb 2.6 oz (91.7 kg)   SpO2 99%   BMI 33.64 kg/m   HEMODYNAMICS:    VENTILATOR SETTINGS: Vent Mode: PRVC FiO2 (%):  [30 %] 30 % Set Rate:  [12 bmp-24 bmp] 24 bmp Vt Set:  [500 mL] 500 mL PEEP:  [5 cmH20] 5 cmH20 Plateau Pressure:  [19 cmH20-22 cmH20] 20 cmH20  INTAKE / OUTPUT: I/O last 3 completed shifts: In: 2067.5 [I.V.:310; NG/GT:1655; IV Piggyback:102.5] Out: 2440 [Urine:2970; Stool:325]  PHYSICAL EXAMINATION:  General:  In bed on vent HENT: NCAT ETT in place PULM: wheezing B, vent supported breathing, trach in place  CV: RRR, no mgr GI: BS+, soft, nontender MSK: normal bulk and tone Neuro: sedated on vent   LABS:  BMET Recent Labs  Lab 08/14/17 0420 08/15/17 0412 08/16/17 0603  NA 146* 144 144  K 4.1 3.3* 3.2*  CL 115* 115* 116*  CO2 18* 17* 17*  BUN 48* 42* 37*  CREATININE 1.25* 1.16* 1.07*  GLUCOSE 184* 200* 112*    Electrolytes Recent Labs  Lab 08/10/17 0922 08/11/17 0422 08/12/17 0424  08/14/17 0420 08/15/17 0412 08/16/17 0603  CALCIUM 8.6* 8.3* 8.3*   < > 8.3* 8.3* 8.4*  MG 1.8 2.1 1.8  --   --   --   --   PHOS 1.6* 3.3 3.5  --   --   --   --    < > = values in this interval not displayed.    CBC Recent Labs  Lab 08/12/17 0424 08/13/17 0548 08/16/17 0829  WBC 17.2* 17.4* 14.1*  HGB 10.1* 9.6* 9.9*   HCT 31.3* 30.4* 30.3*  PLT 262 285 359    Coag's No results for input(s): APTT, INR in the last 168 hours.  Sepsis Markers No results for input(s): LATICACIDVEN, PROCALCITON, O2SATVEN in the last 168 hours.  ABG Recent Labs  Lab 08/11/17 0330  PHART 7.385  PCO2ART 31.9*  PO2ART 75.5*    Liver Enzymes Recent Labs  Lab 08/16/17 0829  ALBUMIN 1.4*    Cardiac Enzymes No results for input(s): TROPONINI, PROBNP in the last 168 hours.  Glucose Recent Labs  Lab 08/16/17 0736 08/16/17 1135 08/16/17 1518 08/16/17 1948 08/16/17 2357 08/17/17 0346  GLUCAP 122* 159* 238* 213* 143* 177*    Imaging No results found.   STUDIES:  EEG 1/24>>>This is an abnormal EEG.There is evidence of moderate generalized slowing of brain activity as well as severe focal slowing in the left temporal lobe. The patient is not in non-convulsive status epilepticus. 2D echo 1/25>>> EF 10-27%, grade 2 diastolic dysfunction  MRI brain 1/25>>> 1. No acute intracranial abnormality. 2. 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 3. Remote hemorrhage in the left thalamus and left pons. 4. Age advanced atrophy. 5. Fluid in the  nasopharynx and paranasal sinuses is likely secondary to intubation.  CULTURES: Blood Cx 1/30>>> Respiratory 1/30>>>Psuedomonas   ANTIBIOTICS: Vancomycin 1/30>>> 1/31 Zosyn 1/30>>> 2/5  LINES/TUBES: CVC 1/24>>> ETT 1/24>>> NG/OG 1/24>>> Tracheostomy 2/6>> PICC 2/6 >>    DISCUSSION: 62 y/o female with a history of stroke, cocaine abuse here with status epilepticus.  MRI brain over weekend showed watershed infarct. Patient will have tracheostomy and PEG placed by the end of this week in anticipation of post ICU care.  ASSESSMENT / PLAN:  PULMONARY A: Acute respiratory failure with hypoxemia-Improving HCAP-resolving Acute Pulm edema-Stable Tracheostomy   P:   Full mechanical vent support VAP prevention Daily WUA/SBT Continue  Duoneb  CARDIOVASCULAR A:  HTN- Patient continue to have elevated BP despite aggressive regimen. Patient on 300 ml of water q6 for hypernatremia which is resolving. Likely contributing to increase BP in the setting of recent stroke. Grade 2 diastolic dysfunction, chronic diastolic heart failure Cocaine abuse with positive urine drug screen  P:  decrease free water volume Continue amlodipine 10 mg daily Continue labetalol 400 mg tid, 5-10 mg PRN q10 min Continue enalapril 20 mg Continue Hydralazine  50 mg q8  RENAL A:   Hypernatremia > resolving Hypokalemia 3.3>>3.2 P:   Monitor BMET and UOP Replace electrolytes as needed Continue free water via tube Hold lasix for now with hypernatremia  GASTROINTESTINAL A:   No acute issues. Evaluated and consented by IR for g tube placement. P:   Continue tube feeding Continue pantoprazole  HEMATOLOGIC A:   Anemia, no bleeding P:  Monitor for bleeding  INFECTIOUS A:   Pseudomonas Pneumonia P:   Zosyn D/ced   ENDOCRINE A:   Hyperglycemia  P:   SSI  NEUROLOGIC A:   Watershed infarct Acute encephalopathy Status epilepticus P:   RASS goal 0 to -1 Fentanyl for vent synchrony Hold propofol Anti-epileptics per neuro  SOCIAL CSW currently working on placement after discussing with daughter options. Family would like patient to be placed close to family in the [facility] area.    FAMILY  - Updates: none bedside, discussed care with daughter on the phone who have reiterate their will to continue patient care as full code  - Inter-disciplinary family meet or Palliative Care meeting due by:  day [place], MD [place], PGY-2  08/17/2017, 6:45 AM

## 2017-08-17 NOTE — Progress Notes (Signed)
CSW has faxed pt out to vent/snf facilities at this time. Will continue to follow for nay further needs at this time.      Virgie Dad Aman Batley, MSW, [place] [facility] Clinical Social Worker [phone]

## 2017-08-17 NOTE — Progress Notes (Signed)
Subjective: Continues to gradually awaken.  Exam: Vitals:   08/17/17 0900 08/17/17 0930  BP:  (!) 209/97  Pulse: 99 96  Resp: (!) 28 15  Temp: 98.4 F (36.9 C)   SpO2: 98% 100%   Gen: In bed, NAD Resp: non-labored breathing, no acute distress Abd: soft, nt  Neuro: MS: Opens eyes spontaneously. She does not follow commands, does not fixate.   KW:IOXBD, eyes are slightly dysconjugate, but centered around  midine and cross midline with doll's eye.  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 She was in subtle convulsive status epilepticus on arrival and presumably was in this state for some time as evidenced by the condition in which she was found. She had not been seen for two days prior to being found.   Her seizures were super refractory despite high doses of propofol. She was changed to pentobarbital on 1/27 due to continued seizures and reaching a maximum dose on propofol. Since weaning pentobarbital , no further seizures.   Dr. Darnell Level) had a conversation describing likely outcomes with the daughter who indicates she would want to continue aggressive care(e.g. Trach/peg).  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   Recommendations: 1) Increase phenytoin to 300mg  TID, check level this afternoon.  2) Continue keppra 1g Q12H 3) continue vimpat 200mg  BID 4) continue topamax 200mg  BID, could be related to her mild acidosis  Roland Rack, MD [facility] [phone]  If 7pm- 7am, please page neurology on call as listed in [place].

## 2017-08-17 NOTE — Progress Notes (Signed)
CSW received call from [facility] in [place] and was informed that pt is not of age to receive Medicare and a person must have disability for atleast two years. CSW was also informed that at this time [facility] no Medicaid beds, therefore they are unable to meet pt's need at this facility at this time.     Virgie Dad Azelea Seguin, MSW, [place] [facility] Clinical Social Worker [phone]

## 2017-08-17 NOTE — [place] FL2 (Signed)
[place] MEDICAID FL2 LEVEL OF CARE SCREENING TOOL     IDENTIFICATION  Patient Name: [name] Birthdate: January 12, 1956 Sex: female Admission Date (Current Location): 08/02/2017  [facility] Suite and [place] Number:  Herbalist and Address:  [facility] [place]. [facility], [place] [address], [place], [place] [address]      Provider Number: [id number]  Attending Physician Name and Address:  Juanito Doom, MD  Relative Name and Phone Number:  Royston Bake [phone]    Current Level of Care: Hospital Recommended Level of Care: Vent SNF Prior Approval Number:    Date Approved/Denied:   [name] Number:   [id number]   Discharge Plan: SNF    Current Diagnoses: Patient Active Problem List   Diagnosis Date Noted  . Acute respiratory failure with hypoxemia ([place])   . Pressure injury of skin 08/04/2017  . Altered mental status   . Cocaine abuse ([place])   . Status epilepticus ([place]) 08/02/2017  . H/O open leg wound 06/04/2017  . Open leg wound 06/04/2017  . Acute metabolic encephalopathy 56/97/9480  . Intracranial bleed ([place]) 05/26/2017  . ICH (intracerebral hemorrhage) ([place]) 05/26/2017  . Type II diabetes mellitus with renal manifestations ([facility]) 05/25/2017  . SIRS (systemic inflammatory response syndrome) ([place]) 05/25/2017  . Aspiration pneumonia due to gastric secretions ([place])   . Confusion 05/10/2017  . Benign essential HTN   . Chronic hepatitis C without hepatic coma ([place])   . Labile blood glucose   . Hypoglycemia associated with type 2 diabetes mellitus ([place])   . Poorly controlled type 2 diabetes mellitus with peripheral neuropathy ([place])   . Neurologic gait disorder   . Neuropathic pain   . Type 2 diabetes mellitus with peripheral neuropathy (HCC)   . History of intracranial hemorrhage   . Diarrhea   . Urinary incontinence   . Hypokalemia   . Polysubstance abuse ([place])   . Right sided cerebral hemisphere cerebrovascular accident (CVA) ([place])   .  Disorientation   . Meningitis 11/02/2016  . Type 2 diabetes mellitus with hyperosmolar nonketotic hyperglycemia ([place]) 10/31/2016  . Seizure ([place]) 10/31/2016  . ARF (acute renal failure) ([place]) 10/31/2016  . Severe sepsis ([place]) 10/31/2016  . DKA (diabetic ketoacidoses) ([place]) 10/31/2016  . Acute renal failure superimposed on stage 3 chronic kidney disease ([place])   . Right hemiparesis ([place]) 06/09/2016  . 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 Cytotoxic brain edema ([place]) 06/08/2016  . Encephalopathy acute 06/06/2016  . Chest pain, musculoskeletal 05/16/2011  . Vaginal candidiasis 05/16/2011  . Diabetes mellitus, type 2 ([place]) 05/16/2011  . CHEST PAIN 10/01/2009  . CELLULITIS AND ABSCESS OF LEG EXCEPT FOOT 08/31/2009  . INSOMNIA 07/13/2008  . MRSA 04/17/2008  . NECK MASS 04/17/2008  . COCAINE ABUSE 11/01/2007  . OBESITY NOS 07/14/2006  . DENTAL CARIES 07/14/2006  . DIABETES MELLITUS, TYPE II 04/26/2006  . Alcohol abuse 04/26/2006  . TOBACCO ABUSE 04/26/2006  . DEPRESSION 04/26/2006  . Uncontrolled hypertension 04/26/2006  . GERD 04/26/2006  . LOW BACK PAIN 04/26/2006    Orientation RESPIRATION BLADDER Height & Weight     (intubated unable to assess. )  Vent(30-40%) Incontinent Weight: 202 lb 2.6 oz (91.7 kg) Height:  '5\' 5"'$  (165.1 cm)  BEHAVIORAL SYMPTOMS/MOOD NEUROLOGICAL BOWEL NUTRITION STATUS      Continent Diet(please see discharge summary. )  AMBULATORY STATUS COMMUNICATION OF NEEDS Skin   Extensive Assist   Surgical wounds(trach )  Personal Care Assistance Level of Assistance  Bathing, Feeding, Dressing Bathing Assistance: Maximum assistance Feeding assistance: Maximum assistance Dressing Assistance: Maximum assistance     Functional Limitations Info  Sight, Hearing, Speech Sight Info: Adequate Hearing Info: Adequate Speech Info: Impaired(pt had trach placed. )    [place]  PT (By licensed PT), OT (By licensed OT),  Speech therapy     PT Frequency: 5 times a week  OT Frequency: 5 times a week      Speech Therapy Frequency: 5 times a week       Contractures Contractures Info: Not present    Additional Factors Info  Code Status, Allergies Code Status Info: Partial Code Allergies Info: NKA           Current Medications (08/17/2017):  This is the current hospital active medication list Current Facility-Administered Medications  Medication Dose Route Frequency Provider Last Rate Last Dose  . 0.9 %  sodium chloride infusion   Intravenous Continuous Rush Farmer, MD 10 mL/hr at 08/14/17 1324    . acetaminophen (TYLENOL) solution 325 mg  325 mg Per Tube Q6H PRN Omar Person, NP   325 mg at 08/16/17 2004  . albuterol (PROVENTIL) (2.5 MG/3ML) 0.083% nebulizer solution 2.5 mg  2.5 mg Nebulization Q3H PRN Simonne Maffucci B, MD      . amLODipine (NORVASC) tablet 10 mg  10 mg Per Tube Daily Juanito Doom, MD   10 mg at 08/16/17 1156  . aspirin EC tablet 81 mg  81 mg Oral Daily Aroor, Lanice Schwab, MD   81 mg at 08/16/17 1154  . bisacodyl (DULCOLAX) suppository 10 mg  10 mg Rectal Daily Kathi Ludwig, MD   10 mg at 08/13/17 0916  . chlorhexidine gluconate (MEDLINE KIT) (PERIDEX) 0.12 % solution 15 mL  15 mL Mouth Rinse BID Simonne Maffucci B, MD   15 mL at 08/16/17 2005  . Chlorhexidine Gluconate Cloth 2 % PADS 6 each  6 each Topical Daily Juanito Doom, MD   6 each at 08/16/17 1201  . docusate (COLACE) 50 MG/5ML liquid 50 mg  50 mg Per Tube Daily Kathi Ludwig, MD   50 mg at 08/13/17 0917  . enalapril (VASOTEC) tablet 20 mg  20 mg Oral Daily Diallo, Abdoulaye, MD   20 mg at 08/16/17 1154  . feeding supplement (PRO-STAT SUGAR FREE 64) liquid 30 mL  30 mL Per Tube BID Kathi Ludwig, MD   30 mL at 08/16/17 2216  . feeding supplement (VITAL HIGH PROTEIN) liquid 1,000 mL  1,000 mL Per Tube Q24H Kathi Ludwig, MD   1,000 mL at 08/14/17 2311  . fentaNYL (SUBLIMAZE)  injection 50-100 mcg  50-100 mcg Intravenous Q1H PRN Omar Person, NP   100 mcg at 08/15/17 0609  . free water 200 mL  200 mL Per Tube Q8H Diallo, Abdoulaye, MD   200 mL at 08/17/17 0524  . heparin injection 5,000 Units  5,000 Units Subcutaneous Q8H Saverio Danker, PA-C      . hydrALAZINE (APRESOLINE) tablet 50 mg  50 mg Per Tube Q8H Rigoberto Noel, MD   50 mg at 08/17/17 0523  . insulin aspart (novoLOG) injection 0-20 Units  0-20 Units Subcutaneous Q4H Kathi Ludwig, MD   4 Units at 08/17/17 0435  . insulin aspart (novoLOG) injection 6 Units  6 Units Subcutaneous Q4H Neva Seat, MD   6 Units at 08/17/17 0435  . insulin glargine (LANTUS) injection 25 Units  25  Units Subcutaneous Daily Neva Seat, MD   25 Units at 08/15/17 [phone]  . ipratropium-albuterol (DUONEB) 0.5-2.5 (3) MG/3ML nebulizer solution 3 mL  3 mL Nebulization Q6H Simonne Maffucci B, MD   3 mL at 08/17/17 0157  . labetalol (NORMODYNE) tablet 400 mg  400 mg Oral TID Diallo, Abdoulaye, MD   400 mg at 08/16/17 2216  . labetalol (NORMODYNE,TRANDATE) injection 10 mg  10 mg Intravenous Q10 min PRN Rigoberto Noel, MD   10 mg at 08/16/17 0413  . lacosamide (VIMPAT) 200 mg in sodium chloride 0.9 % 25 mL IVPB  200 mg Intravenous Q12H Greta Doom, MD   Stopped at 08/17/17 [phone]  . levETIRAcetam (KEPPRA) 1,000 mg in sodium chloride 0.9 % 100 mL IVPB  1,000 mg Intravenous Q12H Aroor, Lanice Schwab, MD   Stopped at 08/17/17 0451  . MEDLINE mouth rinse  15 mL Mouth Rinse 10 times per day Simonne Maffucci B, MD   15 mL at 08/17/17 0523  . mupirocin cream (BACTROBAN) 2 %   Topical Daily Sampson Goon, MD      . pantoprazole sodium (PROTONIX) 40 mg/20 mL oral suspension 40 mg  40 mg Per Tube QHS Juanito Doom, MD   40 mg at 08/16/17 2219  . phenytoin (DILANTIN) 125 mg in sodium chloride 0.9 % 100 mL IVPB  125 mg Intravenous TID Susa Raring, [facility]   Stopped at 08/16/17 2246  . sodium chloride flush (NS) 0.9 %  injection 10-40 mL  10-40 mL Intracatheter Q12H Juanito Doom, MD   10 mL at 08/16/17 2219  . sodium chloride flush (NS) 0.9 % injection 10-40 mL  10-40 mL Intracatheter PRN Simonne Maffucci B, MD      . thiamine (VITAMIN B-1) tablet 100 mg  100 mg Per Tube Daily Susa Raring, RPH   100 mg at 08/16/17 1155  . topiramate (TOPAMAX) tablet 200 mg  200 mg Per Tube BID Juanito Doom, MD   200 mg at 08/16/17 2217     Discharge Medications: Please see discharge summary for a list of discharge medications.  Relevant Imaging Results:  Relevant Lab Results:   Additional Information SSN [id number]. Pt had trach placed on 08/15/17. 102m Shiley cuffed.   KWetzel Bjornstad LCSWA

## 2017-08-17 NOTE — Progress Notes (Signed)
MEDICATION RELATED CONSULT NOTE - Follow-up  Pharmacy Consult for phenytoin Indication: Seizures  No Known Allergies  Patient Measurements: Height: 5\' 5"  (165.1 cm) Weight: 202 lb 2.6 oz (91.7 kg) IBW/kg (Calculated) : 57  Vital Signs: Temp: 98.6 F (37 C) (02/08 0500) BP: 205/88 (02/08 0751) Pulse Rate: 95 (02/08 0751) Intake/Output from previous day: 02/07 0701 - 02/08 0700 In: 325 [I.V.:120; IV Piggyback:205] Out: 1550 [Urine:1550] Intake/Output from this shift: No intake/output data recorded.  Labs: Recent Labs    08/15/17 0412 08/16/17 0603 08/16/17 0829 08/17/17 0630  WBC  --   --  14.1*  --   HGB  --   --  9.9*  --   HCT  --   --  30.3*  --   PLT  --   --  359  --   CREATININE 1.16* 1.07*  --  1.20*  ALBUMIN  --   --  1.4*  --    Estimated Creatinine Clearance: 55.8 mL/min (A) (by C-G formula based on SCr of 1.2 mg/dL (H)).   Microbiology: Recent Results (from the past 720 hour(s))  MRSA PCR Screening     Status: None   Collection Time: 08/02/17 11:00 PM  Result Value Ref Range Status   MRSA by PCR NEGATIVE NEGATIVE Final    Comment:        The GeneXpert MRSA Assay (FDA approved for NASAL specimens only), is one component of a comprehensive MRSA colonization surveillance program. It is not intended to diagnose MRSA infection nor to guide or monitor treatment for MRSA infections.   Culture, respiratory (NON-Expectorated)     Status: None   Collection Time: 08/08/17 10:06 AM  Result Value Ref Range Status   Specimen Description TRACHEAL ASPIRATE  Final   Special Requests NONE  Final   Gram Stain   Final    MODERATE WBC PRESENT, PREDOMINANTLY PMN FEW GRAM NEGATIVE RODS FEW GRAM POSITIVE COCCI IN PAIRS    Culture   Final    ABUNDANT PSEUDOMONAS AERUGINOSA MODERATE GROUP B STREP(S.AGALACTIAE)ISOLATED TESTING AGAINST S. AGALACTIAE NOT ROUTINELY PERFORMED DUE TO PREDICTABILITY OF AMP/PEN/VAN SUSCEPTIBILITY. Performed at [facility],  [place] [address], [place], [place] [address]    Report Status 08/10/2017 FINAL  Final   Organism ID, Bacteria PSEUDOMONAS AERUGINOSA  Final      Susceptibility   Pseudomonas aeruginosa - MIC*    CEFTAZIDIME 4 SENSITIVE Sensitive     CIPROFLOXACIN <=0.25 SENSITIVE Sensitive     GENTAMICIN <=1 SENSITIVE Sensitive     IMIPENEM 2 SENSITIVE Sensitive     PIP/TAZO 8 SENSITIVE Sensitive     CEFEPIME 2 SENSITIVE Sensitive     * ABUNDANT PSEUDOMONAS AERUGINOSA  Culture, blood (routine x 2)     Status: None   Collection Time: 08/08/17 11:00 AM  Result Value Ref Range Status   Specimen Description BLOOD RIGHT ANTECUBITAL  Final   Special Requests IN PEDIATRIC BOTTLE Blood Culture adequate volume  Final   Culture   Final    NO GROWTH 5 DAYS Performed at [facility], [place] [address] [place], [place] [address]    Report Status 08/13/2017 FINAL  Final  Culture, blood (routine x 2)     Status: None   Collection Time: 08/08/17 11:07 AM  Result Value Ref Range Status   Specimen Description BLOOD RIGHT ANTECUBITAL  Final   Special Requests IN PEDIATRIC BOTTLE Blood Culture adequate volume  Final   Culture   Final  NO GROWTH 5 DAYS Performed at [facility], [place] [address] [place], [place] [address]    Report Status 08/13/2017 FINAL  Final    Medical History: Past Medical History:  Diagnosis Date  . Cocaine abuse, continuous ([place])   . Diabetes mellitus    Hb A1C = 12.6 on 05/15/11, managed on Novolog 70/30, 35 U qam, 25 U qpm  . Hypertension    poorly controlled  . Insomnia disorder   . Shortness of breath     Medications:  Medications Prior to Admission  Medication Sig Dispense Refill Last Dose  . amLODipine (NORVASC) 10 MG tablet Take 1 tablet (10 mg total) daily by mouth. 30 tablet 1 LF 07-30-17 at 30DS  . collagenase (SANTYL) ointment Apply topically daily. Apply Santyl to right leg wound Q day, then cover with moist gauze and dry gauze and kerlex 15 g 0 LF 07-19-17 at 30DS   . furosemide (LASIX) 20 MG tablet Take 20 mg by mouth daily.  2 LF 07-31-17 at 30DS  . gabapentin (NEURONTIN) 300 MG capsule Take 1 capsule (300 mg total) 3 (three) times daily by mouth. 90 capsule 1 LF 07-31-17 at 30DS  . insulin aspart protamine- aspart (NOVOLOG MIX 70/30) (70-30) 100 UNIT/ML injection Inject 0.2 mLs (20 Units total) into the skin 2 (two) times daily with a meal.   LF 07-31-17 at 30DS  . levETIRAcetam (KEPPRA) 500 MG tablet Take 1 tablet (500 mg total) 2 (two) times daily by mouth. 60 tablet 1 LF 05-16-17 at 30DS  . metoprolol tartrate (LOPRESSOR) 25 MG tablet Take 25 mg by mouth 2 (two) times daily.  0 LF 07-30-17 at 30DS  . acetaminophen (TYLENOL) 325 MG tablet Take 2 tablets (650 mg total) every 6 (six) hours as needed by mouth for mild pain, fever or headache. 40 tablet 0 UNK  . atorvastatin (LIPITOR) 40 MG tablet Take 1 tablet (40 mg total) daily at 6 PM by mouth. 30 tablet 1 LF 05-16-17 at 30DS  . cyclobenzaprine (FLEXERIL) 10 MG tablet Take 1 tablet (10 mg total) at bedtime by mouth. 30 tablet 0 LF 05-16-17 at 30DS  . folic acid (FOLVITE) 1 MG tablet Take 1 tablet (1 mg total) by mouth daily. 30 tablet 0 LF 06-04-17 at 30DS  . Multiple Vitamin (MULTIVITAMIN WITH MINERALS) TABS tablet Take 1 tablet by mouth daily.   UNK  . nystatin cream (MYCOSTATIN) Apply topically 2 (two) times daily. 30 g 0 LF 06-04-17 at 14DS  . oxybutynin (DITROPAN-XL) 5 MG 24 hr tablet Take 1 tablet (5 mg total) at bedtime by mouth. 30 tablet 0 05-16-17 at 30DS  . pantoprazole (PROTONIX) 40 MG tablet Take 1 tablet (40 mg total) by mouth daily.   LF 05-16-17 at 30DS  . PROAIR HFA 108 (90 Base) MCG/ACT inhaler Inhale 2 puffs into the lungs 4 (four) times daily as needed for wheezing.  0 LF 10-08-16 at PRN  . thiamine 100 MG tablet Take 1 tablet (100 mg total) by mouth daily. 30 tablet 0 LF 06-04-17 at 30DS    Assessment: Pharmacy helping with phenytoin dosing in collaboration with neurology.   1/29: Phenytoin  level was high, corrected level ~30 and Phenytoin was held. 1/30: Phenytoin random level corrected to ~20, Phenytoin was resumed at lower dose of 100mg  IV BID. 1/31: PM random Phenytoin level corrected to 21.9, no change 2/4: Phenytoin level low, corrects to 7.62 (but using old albumin of 1.6 from 1/31), this is a  5 day level. Patient loaded with Fosphenytoin 750 mg IV X 1 and increased to Phenytoin 100 mg IV TID  2/5: Phenytoin level 22.4 (corrected to 41.5) post-load 2/6: Phenytoin level 3.1 (Corrected to 5.7 using old albumin of 1.6 from 1/31)- 2 day level 2/7: Phenytoin level <2.5 (Corrected to <5.2 using new albumin of 1.4 today)- 3 day level; reload of Fosphenytoin 880 mg per Neurologist and increased phenytoin dose to 125 IV TID 2/8: Phenytoin level 4.2 (corrected to 11.05) post load level, non steady state. *No current significant drug interactions noted, however was on Pentobarbital maybe still contributing to decreased levels of Phenytoin due to increasing the metabolism even though now 10 days out. Neurologist wants level closer to 20.   Note on Topiramate as well and CO2 trending down on BMET daily -currently down to 15 with elevated Cl at 116. Consideration should be made to possibility of hyperchloremic metabolic acidosis in setting of Topiramate. Discussed with Neurologist- will discuss with CCM.   Goal of Therapy:  Phenytoin level 10-20 ug/mL  Plan:  Fosphenytoin 300 mg PE IV TID per Dr. Leonel Ramsay. Phenytoin level this PM and next AM per discussion with Neurology Will need steady state level in 5 to 7 days.   Sloan Leiter, PharmD, BCPS, BCCCP Clinical Pharmacist Clinical phone 08/17/2017 until 3:30PM [phone] After hours, please call #[phone] 08/17/2017, 8:08 AM

## 2017-08-18 DIAGNOSIS — E872 Acidosis, unspecified: Secondary | ICD-10-CM | POA: Diagnosis present

## 2017-08-18 DIAGNOSIS — Z930 Tracheostomy status: Secondary | ICD-10-CM

## 2017-08-18 LAB — PHENYTOIN LEVEL, TOTAL: Phenytoin Lvl: 4.9 ug/mL — ABNORMAL LOW (ref 10.0–20.0)

## 2017-08-18 LAB — GLUCOSE, CAPILLARY
GLUCOSE-CAPILLARY: 122 mg/dL — AB (ref 65–99)
GLUCOSE-CAPILLARY: 186 mg/dL — AB (ref 65–99)
GLUCOSE-CAPILLARY: 98 mg/dL (ref 65–99)
Glucose-Capillary: 118 mg/dL — ABNORMAL HIGH (ref 65–99)
Glucose-Capillary: 139 mg/dL — ABNORMAL HIGH (ref 65–99)
Glucose-Capillary: 72 mg/dL (ref 65–99)
Glucose-Capillary: 92 mg/dL (ref 65–99)

## 2017-08-18 LAB — BASIC METABOLIC PANEL
Anion gap: 12 (ref 5–15)
BUN: 24 mg/dL — AB (ref 6–20)
CO2: 16 mmol/L — AB (ref 22–32)
Calcium: 8.2 mg/dL — ABNORMAL LOW (ref 8.9–10.3)
Chloride: 118 mmol/L — ABNORMAL HIGH (ref 101–111)
Creatinine, Ser: 1 mg/dL (ref 0.44–1.00)
GFR calc Af Amer: >60 mL/min (ref >60)
GFR calc non Af Amer: 60 mL/min — ABNORMAL LOW (ref >60)
Glucose, Bld: 95 mg/dL (ref 65–99)
POTASSIUM: 2.8 mmol/L — AB (ref 3.5–5.1)
SODIUM: 146 mmol/L — AB (ref 135–145)

## 2017-08-18 MED ORDER — FREE WATER
300.0000 mL | Status: DC
  Administered 2017-08-18 – 2017-08-20 (×12): 300 mL

## 2017-08-18 MED ORDER — POTASSIUM CHLORIDE 20 MEQ/15ML (10%) PO SOLN
40.0000 meq | ORAL | Status: AC
  Administered 2017-08-18: 40 meq
  Filled 2017-08-18: qty 30

## 2017-08-18 MED ORDER — FUROSEMIDE 10 MG/ML IJ SOLN
40.0000 mg | Freq: Three times a day (TID) | INTRAMUSCULAR | Status: AC
  Administered 2017-08-18 (×2): 40 mg via INTRAVENOUS
  Filled 2017-08-18 (×2): qty 4

## 2017-08-18 MED ORDER — BACITRACIN-NEOMYCIN-POLYMYXIN 400-5-5000 EX OINT
1.0000 "application " | TOPICAL_OINTMENT | Freq: Every day | CUTANEOUS | Status: AC
  Administered 2017-08-19 – 2017-08-24 (×6): 1 via TOPICAL
  Filled 2017-08-18: qty 28.4

## 2017-08-18 MED ORDER — SODIUM CHLORIDE 0.9 % IV SOLN
250.0000 mg | Freq: Four times a day (QID) | INTRAVENOUS | Status: DC
  Administered 2017-08-18 – 2017-08-21 (×10): 250 mg via INTRAVENOUS
  Filled 2017-08-18 (×22): qty 5

## 2017-08-18 MED ORDER — POTASSIUM CHLORIDE 20 MEQ/15ML (10%) PO SOLN
40.0000 meq | Freq: Three times a day (TID) | ORAL | Status: AC
  Administered 2017-08-18 (×2): 40 meq
  Filled 2017-08-18 (×2): qty 30

## 2017-08-18 NOTE — Progress Notes (Signed)
[facility] ADULT ICU REPLACEMENT PROTOCOL FOR AM LAB REPLACEMENT ONLY  The patient does apply for the [facility] Adult ICU Electrolyte Replacment Protocol based on the criteria listed below:   1. Is GFR >/= 40 ml/min? Yes.    Patient's GFR today is >60 2. Is urine output >/= 0.5 ml/kg/hr for the last 6 hours? Yes.   Patient's UOP is 0.77 ml/kg/hr 3. Is BUN < 60 mg/dL? Yes.    Patient's BUN today is 24 4. Abnormal electrolyte(s): Potassium 2.8 5. Ordered repletion with: Potassium per protocol 6. If a panic level lab has been reported, has the CCM MD in charge been notified? Yes.  .   Physician:  Lynelle Doctor, Thang Flett P 08/18/2017 6:13 AM

## 2017-08-18 NOTE — Progress Notes (Signed)
Subjective: No significant change.   Exam: Vitals:   08/18/17 0700 08/18/17 0756  BP: (!) 182/84   Pulse: 95 97  Resp: (!) 32 (!) 32  Temp: 97.7 F (36.5 C) (!) 97.3 F (36.3 C)  SpO2: 97% 97%   Gen: In bed, tracheostomy in place Resp: ventilated.  Abd: soft, nt  Neuro: MS: Opens eyes spontaneously. She does not follow commands, does appears to fixate once, but this is by no means definite. 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 She was in subtle convulsive status epilepticus on arrival and presumably was in this state for some time as evidenced by the condition in which she was found. She had not been seen for two days prior to being found.   Her seizures were super refractory despite high doses of propofol. She was changed to pentobarbital on 1/27 due to continued seizures and reaching a maximum dose on propofol. Since weaning pentobarbital , no further seizures.   Dr. Darnell Level) had a conversation describing likely outcomes with the daughter who indicates she would want to continue aggressive care(e.g. Trach/peg).  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   Recommendations: 1) increase phenytoin to 330mg  TID, discuss with pharmacy 2) Continue keppra 1g Q12H 3) continue vimpat 200mg  BID 4) continue topamax 200mg  BID, could be related to her mild acidosis  Roland Rack, MD [facility] [phone]  If 7pm- 7am, please page neurology on call as listed in [place].

## 2017-08-18 NOTE — Progress Notes (Addendum)
PULMONARY / CRITICAL CARE MEDICINE   Name: [name] MRN: [id number] DOB: [birth year]    ADMISSION DATE:  08/02/2017 CONSULTATION DATE:  08/02/2017  CHIEF COMPLAINT:  Found down  HISTORY OF PRESENT ILLNESS:   62 y/o female with a history of left thalamic hemorrhage previously who was admitted with seizure activity.     SUBJECTIVE:  Completely unresponsive, not weaning or following commands   VITAL SIGNS: BP (!) 182/84   Pulse 97   Temp (!) 97.3 F (36.3 C)   Resp (!) 32   Ht 5\' 5"  (1.651 m)   Wt 91.5 kg (201 lb 11.5 oz)   SpO2 97%   BMI 33.57 kg/m   HEMODYNAMICS:    VENTILATOR SETTINGS: Vent Mode: PRVC FiO2 (%):  [30 %-100 %] 30 % Set Rate:  [24 bmp] 24 bmp Vt Set:  [500 mL] 500 mL PEEP:  [5 cmH20] 5 cmH20 Plateau Pressure:  [17 cmH20-27 cmH20] 27 cmH20  INTAKE / OUTPUT: I/O last 3 completed shifts: In: 1118.5 [I.V.:375; NG/GT:535; IV Piggyback:208.5] Out: 3280 [Urine:3230; Stool:50]  PHYSICAL EXAMINATION:  General:  Chronically ill appearing on vent, NAD HENT: [place]/AT, PERRL, EOM-I and MMM PULM: Coarse BS diffusely CV: RRR, Nl S1/S2 and -M/R/G GI: Soft, NT, ND and +BS MSK: -edema and -tenderness Neuro: sedated on vent  LABS:  BMET Recent Labs  Lab 08/16/17 0603 08/17/17 0630 08/18/17 0439  NA 144 145 146*  K 3.2* 3.0* 2.8*  CL 116* 116* 118*  CO2 17* 15* 16*  BUN 37* 34* 24*  CREATININE 1.07* 1.20* 1.00  GLUCOSE 112* 188* 95   Electrolytes Recent Labs  Lab 08/12/17 0424  08/16/17 0603 08/17/17 0630 08/18/17 0439  CALCIUM 8.3*   < > 8.4* 8.3* 8.2*  MG 1.8  --   --   --   --   PHOS 3.5  --   --   --   --    < > = values in this interval not displayed.   CBC Recent Labs  Lab 08/12/17 0424 08/13/17 0548 08/16/17 0829  WBC 17.2* 17.4* 14.1*  HGB 10.1* 9.6* 9.9*  HCT 31.3* 30.4* 30.3*  PLT 262 285 359   Coag's Recent Labs  Lab 08/17/17 0948  INR 1.64   Sepsis Markers No results for input(s): LATICACIDVEN, PROCALCITON,  O2SATVEN in the last 168 hours.  ABG No results for input(s): PHART, PCO2ART, PO2ART in the last 168 hours.  Liver Enzymes Recent Labs  Lab 08/16/17 0829  ALBUMIN 1.4*   Cardiac Enzymes No results for input(s): TROPONINI, PROBNP in the last 168 hours.  Glucose Recent Labs  Lab 08/17/17 1224 08/17/17 1529 08/17/17 1941 08/17/17 2356 08/18/17 0329 08/18/17 0736  GLUCAP 176* 177* 144* 98 92 122*   Imaging Ir Gastrostomy Tube Mod Sed  Result Date: 08/17/2017 INDICATION: 62 year old female with a history of dysphagia EXAM: PERC PLACEMENT GASTROSTOMY MEDICATIONS: 1 g Ancef ANESTHESIA/SEDATION: Versed 0.5 mg IV; Fentanyl 25 mcg IV Moderate Sedation Time:  10 minutes The patient was continuously monitored during the procedure by the interventional radiology nurse under my direct supervision. CONTRAST:  10 cc administered into the gastric lumen. FLUOROSCOPY TIME:  Fluoroscopy Time: 1 minute COMPLICATIONS: None PROCEDURE: Informed written consent was obtained from the patient's family after a thorough discussion of the procedural risks, benefits and alternatives. All questions were addressed. Maximal Sterile Barrier Technique was utilized including caps, mask, sterile gowns, sterile gloves, sterile drape, hand hygiene and skin antiseptic. A timeout was performed prior to the  initiation of the procedure. The procedure, risks, benefits, and alternatives were explained to the patient. Questions regarding the procedure were encouraged and answered. The patient understands and consents to the procedure. The epigastrium was prepped with Betadine in a sterile fashion, and a sterile drape was applied covering the operative field. A sterile gown and sterile gloves were used for the procedure. A 5-French orogastric tube is placed under fluoroscopic guidance. Scout imaging of the abdomen confirms barium within the transverse colon. The stomach was distended with gas. Under fluoroscopic guidance, an 18 gauge  needle was utilized to puncture the anterior wall of the body of the stomach. An Amplatz wire was advanced through the needle passing a T fastener into the lumen of the stomach. The T fastener was secured for gastropexy. A 9-French sheath was inserted. A snare was advanced through the 9-French sheath. A Britta Mccreedy was advanced through the orogastric tube. It was snared then pulled out the oral cavity, pulling the snare, as well. The leading edge of the gastrostomy was attached to the snare. It was then pulled down the esophagus and out the percutaneous site. It was secured in place. Contrast was injected. No complication IMPRESSION: Status post fluoroscopic placed percutaneous gastrostomy tube, with 20 [place] pull-through. Signed, Dulcy Fanny. Earleen Newport, DO [facility] Radiology Electronically Signed   By: Corrie Mckusick D.O.   On: 08/17/2017 11:46   Dg Abd Portable 1v  Result Date: 08/17/2017 CLINICAL DATA:  Feeding tube placement EXAM: PORTABLE ABDOMEN - 1 VIEW COMPARISON:  08/17/2017, 08/15/2017 FINDINGS: Esophageal tube tip projects over the mid stomach, side-port projects over the gastric fundus. Contrast material within the bowel. Gastrostomy balloon visualized in the mid abdominal region, projecting over distal gastric region. Pelvic/rectal probe noted IMPRESSION: 1. Gastrostomy balloon projects over anticipated location of distal stomach; there is residual contrast in the bowel 2. Esophageal tube tip and side-port project over the proximal to mid stomach. Electronically Signed   By: Donavan Foil M.D.   On: 08/17/2017 18:18   STUDIES:  EEG 1/24>>>This is an abnormal EEG.There is evidence of moderate generalized slowing of brain activity as well as severe focal slowing in the left temporal lobe. The patient is not in non-convulsive status epilepticus. 2D echo 1/25>>> EF 75-64%, grade 2 diastolic dysfunction  MRI brain 1/25>>> 1. No acute intracranial  abnormality. 2. 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 3. Remote hemorrhage in the left thalamus and left pons. 4. Age advanced atrophy. 5. Fluid in the nasopharynx and paranasal sinuses is likely secondary to intubation.  CULTURES: Blood Cx 1/30>>> Respiratory 1/30>>>Psuedomonas   ANTIBIOTICS: Vancomycin 1/30>>> 1/31 Zosyn 1/30>>> 2/5  LINES/TUBES: CVC 1/24>>> ETT 1/24>>> NG/OG 1/24>>> Tracheostomy 2/6>> PICC 2/6 >>  I reviewed CXR myself, trach is in good position  DISCUSSION: 62 y/o female with a history of stroke, cocaine abuse here with status epilepticus.  MRI brain over weekend showed watershed infarct. Patient will have tracheostomy and PEG placed by the end of this week in anticipation of post ICU care.  ASSESSMENT / PLAN:  PULMONARY A: Acute respiratory failure with hypoxemia-Improving HCAP-resolving Acute Pulm edema-Stable Tracheostomy   P:   Begin PS trials, no TC today VAP prevention Daily WUA/SBT Continue Duoneb  CARDIOVASCULAR A:  HTN- Patient continue to have elevated BP despite aggressive regimen. Patient on 300 ml of water q6 for hypernatremia which is resolving. Likely contributing to increase BP in the setting of recent stroke. Grade 2 diastolic dysfunction, chronic diastolic heart  failure Cocaine abuse with positive urine drug screen  P:  Increase free water to 300 q4 Continue amlodipine 10 mg daily Continue labetalol 400 mg tid, 5-10 mg PRN q10 min Continue enalapril 20 mg Continue Hydralazine  50 mg q8  RENAL A:   Hypernatremia > resolving Hypokalemia 3.3>>3.2 P:   Monitor BMET and UOP Replace electrolytes as needed Increase free water as above Lasix 2 doses as ordered  GASTROINTESTINAL A:   No acute issues. Evaluated and consented by IR for g tube placement. P:   Continue tube feeding Continue pantoprazole  HEMATOLOGIC A:   Anemia, no bleeding P:  Monitor for bleeding  INFECTIOUS A:   Pseudomonas  Pneumonia P:   Zosyn D/ced   ENDOCRINE A:   Hyperglycemia  P:   SSI  NEUROLOGIC A:   Watershed infarct Acute encephalopathy Status epilepticus P:   RASS goal 0 to -1 Fentanyl for vent synchrony Hold propofol Anti-epileptics per neuro  SOCIAL CSW currently working on placement after discussing with daughter options. Family would like patient to be placed close to family in the [facility] area.   FAMILY  - Updates: none bedside, discussed care with daughter on the phone who have reiterate their will to continue patient care as full code  - Inter-disciplinary family meet or Palliative Care meeting due by:  day 7  Discussed with PCCM-NP.  The patient is critically ill with multiple organ systems failure and requires high complexity decision making for assessment and support, frequent evaluation and titration of therapies, application of advanced monitoring technologies and extensive interpretation of multiple databases.   Critical Care Time devoted to patient care services described in this note is  35  Minutes. This time reflects time of care of this signee Dr Jennet Maduro. This critical care time does not reflect procedure time, or teaching time or supervisory time of PA/NP/Med student/Med Resident etc but could involve care discussion time.  Rush Farmer, M.D. [facility] Pulmonary/Critical Care Medicine. Pager: [phone]. After hours pager: [phone].  08/18/2017, 9:52 AM

## 2017-08-19 DIAGNOSIS — F1910 Other psychoactive substance abuse, uncomplicated: Secondary | ICD-10-CM

## 2017-08-19 DIAGNOSIS — I5032 Chronic diastolic (congestive) heart failure: Secondary | ICD-10-CM

## 2017-08-19 DIAGNOSIS — F1010 Alcohol abuse, uncomplicated: Secondary | ICD-10-CM

## 2017-08-19 LAB — BASIC METABOLIC PANEL
Anion gap: 12 (ref 5–15)
BUN: 21 mg/dL — ABNORMAL HIGH (ref 6–20)
CALCIUM: 8.3 mg/dL — AB (ref 8.9–10.3)
CHLORIDE: 119 mmol/L — AB (ref 101–111)
CO2: 16 mmol/L — AB (ref 22–32)
Creatinine, Ser: 0.98 mg/dL (ref 0.44–1.00)
GFR calc non Af Amer: >60 mL/min (ref >60)
Glucose, Bld: 131 mg/dL — ABNORMAL HIGH (ref 65–99)
Potassium: 3.2 mmol/L — ABNORMAL LOW (ref 3.5–5.1)
SODIUM: 147 mmol/L — AB (ref 135–145)

## 2017-08-19 LAB — GLUCOSE, CAPILLARY
GLUCOSE-CAPILLARY: 121 mg/dL — AB (ref 65–99)
GLUCOSE-CAPILLARY: 97 mg/dL (ref 65–99)
Glucose-Capillary: 89 mg/dL (ref 65–99)
Glucose-Capillary: 133 mg/dL — ABNORMAL HIGH (ref 65–99)
Glucose-Capillary: 66 mg/dL (ref 65–99)

## 2017-08-19 LAB — CBC
HEMATOCRIT: 31.1 % — AB (ref 36.0–46.0)
HEMOGLOBIN: 9.9 g/dL — AB (ref 12.0–15.0)
MCH: 30.7 pg (ref 26.0–34.0)
MCHC: 31.8 g/dL (ref 30.0–36.0)
MCV: 96.6 fL (ref 78.0–100.0)
Platelets: 535 10*3/uL — ABNORMAL HIGH (ref 150–400)
RBC: 3.22 MIL/uL — ABNORMAL LOW (ref 3.87–5.11)
RDW: 16 % — ABNORMAL HIGH (ref 11.5–15.5)
WBC: 17.5 10*3/uL — AB (ref 4.0–10.5)

## 2017-08-19 LAB — PHOSPHORUS: PHOSPHORUS: 3.8 mg/dL (ref 2.5–4.6)

## 2017-08-19 LAB — MAGNESIUM: MAGNESIUM: 1.7 mg/dL (ref 1.7–2.4)

## 2017-08-19 LAB — PHENYTOIN LEVEL, TOTAL: PHENYTOIN LVL: 9.1 ug/mL — AB (ref 10.0–20.0)

## 2017-08-19 MED ORDER — MAGNESIUM SULFATE 2 GM/50ML IV SOLN
2.0000 g | Freq: Once | INTRAVENOUS | Status: AC
  Administered 2017-08-19: 2 g via INTRAVENOUS
  Filled 2017-08-19: qty 50

## 2017-08-19 MED ORDER — POTASSIUM CHLORIDE 20 MEQ/15ML (10%) PO SOLN
50.0000 meq | Freq: Once | ORAL | Status: AC
  Administered 2017-08-19: 50 meq
  Filled 2017-08-19: qty 45

## 2017-08-19 NOTE — Progress Notes (Signed)
PROGRESS NOTE    [name]  BJY:[id number] DOB: 1955/12/24 DOA: 08/02/2017 PCP: Nolene Ebbs, MD   Brief Narrative:  62 year old BF PMHx polysubstance abuse (EtOH, Cocaine)Diabetes mellitus, HTN, Insomnia disorder, Shortness of breath.   Previously admitted to this hospital for a left thalamic hemorrhage.  She was found down today in a puddle of stool and urine.  She brought to our department emergency medicine where she had overt clinical seizure activity.  She was treated with Keppra fosphenytoin and Ativan in the clinical seizures have subsided.  EEG does not show ongoing seizure activity but shows decreased activity throughout the left hemisphere suggesting a postictal state.  She is unable to give any history and remains obtunded at this time.  Imaging studies did not show a new structural lesion.  She was coughing up purulent material in the department of emergency medicine.      Subjective: 2/10  eyes open: obtunded,   Assessment & Plan:   Active Problems:   Uncontrolled hypertension   Cerebrovascular accident (CVA) ([place])   Status epilepticus ([place])   Altered mental status   Cocaine abuse ([place])   Pressure injury of skin   Acute respiratory failure with hypoxemia (H[facility])   Metabolic acidosis   Tracheostomy status ([facility])   Polysubstance abuse (EtOH, Cocaine)   CVA/Watershed infarct/Acute encephalopath  Seizure  Positive pseudomonas pneumonia -Completed course of treatment  Acute respiratory failure with hypoxemia -Vent dependent   Chronic diastolic CHF -Amlodipine 10 mg daily -Labetalol 400 mg tid, 5-10 mg PRN q10 min -Enalapril 20 mg -Hydralazine 50 mg q8  Essential HTN    Hypernatremia  -Free water 300 ml q6   Hypokalemia -Potassium goal> 4 -Potassium 50 mEq  Hypomagnesemia -Magnesium goal> 2 -Magnesium IV 2 g   Anemia,       Goals of care  -CSW currently working on placement after discussing with daughter options. Family would like  patient to be placed close to family in the [facility] area.      DVT prophylaxis: SCD Code Status: Full Family Communication: None Disposition Plan: TBD   Consultants:  Neurology [facility] M    Procedures/Significant Events:  EEG 1/24>>>This is an abnormal EEG.  There is evidence of moderate generalized slowing of brain activity as well as severe focal slowing in the left temporal lobe.  The patient is not in non-convulsive status epilepticus. 2D echo 1/25>>> EF 08-65%, grade 2 diastolic dysfunction  MRI brain 1/25>>> 1. No acute intracranial abnormality. 2. 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 3. Remote hemorrhage in the left thalamus and left pons. 4. Age advanced atrophy. 5. Fluid in the nasopharynx and paranasal sinuses is likely secondary to intubation.    I have personally reviewed and interpreted all radiology studies and my findings are as above.  VENTILATOR SETTINGS: PRVC Vt set: 558m Set rate: 24 FiO2: 30% PEEP; 5 cm H2O    Cultures Blood Cx 1/30>>> Respiratory 1/30>>>Psuedomonas    Antimicrobials: Anti-infectives (From admission, onward)   Start     Stop   08/17/17 1145  ceFAZolin (ANCEF) IVPB 1 g/50 mL premix     08/17/17 1144   08/17/17 1120  ceFAZolin (ANCEF) powder 1 g  Status:  Discontinued     08/17/17 1141   08/17/17 1119  ceFAZolin (ANCEF) IVPB 2g/100 mL premix  Status:  Discontinued     08/17/17 1132   08/17/17 1057  ceFAZolin (ANCEF) 2-4 GM/100ML-% IVPB    Comments:  BCamelia Phenes  : cabinet  override   08/17/17 1131   08/17/17 0600  ceFAZolin (ANCEF) IVPB 2g/100 mL premix     08/17/17 0553   08/16/17 1530  ceFAZolin (ANCEF) IVPB 2g/100 mL premix  Status:  Discontinued     08/16/17 1526   08/08/17 2300  vancomycin (VANCOCIN) IVPB 750 mg/150 ml premix  Status:  Discontinued     08/10/17 0923   08/08/17 1400  piperacillin-tazobactam (ZOSYN) IVPB 3.375 g  Status:  Discontinued     08/14/17 0745   08/08/17 1100   vancomycin (VANCOCIN) 2,000 mg in sodium chloride 0.9 % 500 mL IVPB     08/08/17 1253       Devices    LINES / TUBES:  CVC 1/24>>>2/6 ETT 1/24>>>2/6 NG/OG 1/24>>> Tracheostomy 2/6>> PICC 2/6 >>     Continuous Infusions: . sodium chloride 10 mL/hr at 08/18/17 0600  . lacosamide (VIMPAT) IV Stopped (08/19/17 0347)  . levETIRAcetam Stopped (08/19/17 0457)  . phenytoin (DILANTIN) IV 250 mg (08/19/17 0550)     Objective: Vitals:   08/19/17 0823 08/19/17 0934 08/19/17 0935 08/19/17 0937  BP:  138/65 138/65 138/65  Pulse:   89   Resp:      Temp:      TempSrc:      SpO2: 99%     Weight:      Height:        Intake/Output Summary (Last 24 hours) at 08/19/2017 0954 Last data filed at 08/19/2017 0600 Gross per 24 hour  Intake 775 ml  Output 1600 ml  Net -825 ml   Filed Weights   08/17/17 0322 08/18/17 0500 08/19/17 0454  Weight: 202 lb 2.6 oz (91.7 kg) 201 lb 11.5 oz (91.5 kg) 194 lb 14.2 oz (88.4 kg)    Examination:  General: Obtunded, acute respiratory distress Eyes: eyes open spontaneously positive anisocoria, Neck:  Negative scars, masses, torticollis, lymphadenopathy, JVD, #6 cuffed trach in place are clean negative sign of infection Lungs: Clear to auscultation bilaterally without wheezes or crackles Cardiovascular: Regular rate and rhythm without murmur gallop or rub normal S1 and S2 Abdomen: negative abdominal pain, nondistended, positive soft, bowel sounds, no rebound, no ascites, no appreciable mass, PEG tube in place negative sign of infection Extremities: No significant cyanosis, clubbing, or edema bilateral lower extremities Skin: Negative rashes, lesions, ulcers Psychiatric:  Unable to assess secondary to CVA Central nervous system:  Unable to assess secondary CVA   .     Data Reviewed: Care during the described time interval was provided by me .  I have reviewed this patient's available data, including medical history, events of note, physical  examination, and all test results as part of my evaluation.   CBC: Recent Labs  Lab 08/13/17 0548 08/16/17 0829 08/19/17 0450  WBC 17.4* 14.1* 17.5*  HGB 9.6* 9.9* 9.9*  HCT 30.4* 30.3* 31.1*  MCV 96.5 95.0 96.6  PLT 285 359 831*   Basic Metabolic Panel: Recent Labs  Lab 08/15/17 0412 08/16/17 0603 08/17/17 0630 08/18/17 0439 08/19/17 0450  NA 144 144 145 146* 147*  K 3.3* 3.2* 3.0* 2.8* 3.2*  CL 115* 116* 116* 118* 119*  CO2 17* 17* 15* 16* 16*  GLUCOSE 200* 112* 188* 95 131*  BUN 42* 37* 34* 24* 21*  CREATININE 1.16* 1.07* 1.20* 1.00 0.98  CALCIUM 8.3* 8.4* 8.3* 8.2* 8.3*  MG  --   --   --   --  1.7  PHOS  --   --   --   --  3.8   GFR: Estimated Creatinine Clearance: 67.1 mL/min (by C-G formula based on SCr of 0.98 mg/dL). Liver Function Tests: Recent Labs  Lab 08/16/17 0829  ALBUMIN 1.4*   No results for input(s): LIPASE, AMYLASE in the last 168 hours. No results for input(s): AMMONIA in the last 168 hours. Coagulation Profile: Recent Labs  Lab 08/17/17 0948  INR 1.64   Cardiac Enzymes: No results for input(s): CKTOTAL, CKMB, CKMBINDEX, TROPONINI in the last 168 hours. BNP (last 3 results) No results for input(s): PROBNP in the last 8760 hours. HbA1C: No results for input(s): HGBA1C in the last 72 hours. CBG: Recent Labs  Lab 08/18/17 1641 08/18/17 2019 08/19/17 0005 08/19/17 0416 08/19/17 0805  GLUCAP 139* 118* 72 121* 89   Lipid Profile: No results for input(s): CHOL, HDL, LDLCALC, TRIG, CHOLHDL, LDLDIRECT in the last 72 hours. Thyroid Function Tests: No results for input(s): TSH, T4TOTAL, FREET4, T3FREE, THYROIDAB in the last 72 hours. Anemia Panel: No results for input(s): VITAMINB12, FOLATE, FERRITIN, TIBC, IRON, RETICCTPCT in the last 72 hours. Urine analysis:    Component Value Date/Time   COLORURINE YELLOW 08/05/2017 1216   APPEARANCEUR HAZY (A) 08/05/2017 1216   LABSPEC 1.017 08/05/2017 1216   PHURINE 5.0 08/05/2017 1216    GLUCOSEU NEGATIVE 08/05/2017 1216   GLUCOSEU > 1000 mg/dL (A) 11/01/2007 2056   HGBUR NEGATIVE 08/05/2017 1216   HGBUR negative 07/15/2007 0954   BILIRUBINUR NEGATIVE 08/05/2017 1216   KETONESUR NEGATIVE 08/05/2017 1216   PROTEINUR 30 (A) 08/05/2017 1216   UROBILINOGEN 1.0 04/09/2012 1524   NITRITE NEGATIVE 08/05/2017 1216   LEUKOCYTESUR TRACE (A) 08/05/2017 1216   Sepsis Labs: _0 (procalcitonin:4,lacticidven:4)  )No results found for this or any previous visit (from the past 240 hour(s)).       Radiology Studies: Ir Gastrostomy Tube Mod Sed  Result Date: 08/17/2017 INDICATION: 62 year old female with a history of dysphagia EXAM: PERC PLACEMENT GASTROSTOMY MEDICATIONS: 1 g Ancef ANESTHESIA/SEDATION: Versed 0.5 mg IV; Fentanyl 25 mcg IV Moderate Sedation Time:  10 minutes The patient was continuously monitored during the procedure by the interventional radiology nurse under my direct supervision. CONTRAST:  10 cc administered into the gastric lumen. FLUOROSCOPY TIME:  Fluoroscopy Time: 1 minute COMPLICATIONS: None PROCEDURE: Informed written consent was obtained from the patient's family after a thorough discussion of the procedural risks, benefits and alternatives. All questions were addressed. Maximal Sterile Barrier Technique was utilized including caps, mask, sterile gowns, sterile gloves, sterile drape, hand hygiene and skin antiseptic. A timeout was performed prior to the initiation of the procedure. The procedure, risks, benefits, and alternatives were explained to the patient. Questions regarding the procedure were encouraged and answered. The patient understands and consents to the procedure. The epigastrium was prepped with Betadine in a sterile fashion, and a sterile drape was applied covering the operative field. A sterile gown and sterile gloves were used for the procedure. A 5-French orogastric tube is placed under fluoroscopic guidance. Scout imaging of the abdomen confirms  barium within the transverse colon. The stomach was distended with gas. Under fluoroscopic guidance, an 18 gauge needle was utilized to puncture the anterior wall of the body of the stomach. An Amplatz wire was advanced through the needle passing a T fastener into the lumen of the stomach. The T fastener was secured for gastropexy. A 9-French sheath was inserted. A snare was advanced through the 9-French sheath. A Britta Mccreedy was advanced through the orogastric tube. It was snared then pulled out the oral cavity, pulling  the snare, as well. The leading edge of the gastrostomy was attached to the snare. It was then pulled down the esophagus and out the percutaneous site. It was secured in place. Contrast was injected. No complication IMPRESSION: Status post fluoroscopic placed percutaneous gastrostomy tube, with 20 [place] pull-through. Signed, Dulcy Fanny. Earleen Newport, DO [facility] Radiology Electronically Signed   By: Corrie Mckusick D.O.   On: 08/17/2017 11:46   Dg Abd Portable 1v  Result Date: 08/17/2017 CLINICAL DATA:  Feeding tube placement EXAM: PORTABLE ABDOMEN - 1 VIEW COMPARISON:  08/17/2017, 08/15/2017 FINDINGS: Esophageal tube tip projects over the mid stomach, side-port projects over the gastric fundus. Contrast material within the bowel. Gastrostomy balloon visualized in the mid abdominal region, projecting over distal gastric region. Pelvic/rectal probe noted IMPRESSION: 1. Gastrostomy balloon projects over anticipated location of distal stomach; there is residual contrast in the bowel 2. Esophageal tube tip and side-port project over the proximal to mid stomach. Electronically Signed   By: Donavan Foil M.D.   On: 08/17/2017 18:18        Scheduled Meds: . amLODipine  10 mg Per Tube Daily  . aspirin EC  81 mg Oral Daily  . bisacodyl  10 mg Rectal Daily  . chlorhexidine gluconate (MEDLINE KIT)  15 mL Mouth Rinse BID  . Chlorhexidine Gluconate Cloth  6  each Topical Daily  . docusate  50 mg Per Tube Daily  . enalapril  20 mg Oral Daily  . feeding supplement (PRO-STAT SUGAR FREE 64)  30 mL Per Tube BID  . feeding supplement (VITAL HIGH PROTEIN)  1,000 mL Per Tube Q24H  . free water  300 mL Per Tube Q4H  . heparin  5,000 Units Subcutaneous Q8H  . hydrALAZINE  50 mg Per Tube Q8H  . insulin aspart  0-20 Units Subcutaneous Q4H  . insulin aspart  6 Units Subcutaneous Q4H  . insulin glargine  25 Units Subcutaneous Daily  . ipratropium-albuterol  3 mL Nebulization Q6H  . labetalol  400 mg Oral TID  . mouth rinse  15 mL Mouth Rinse 10 times per day  . mupirocin cream   Topical Daily  . neomycin-bacitracin-polymyxin  1 application Topical Daily  . pantoprazole sodium  40 mg Per Tube QHS  . sodium chloride flush  10-40 mL Intracatheter Q12H  . thiamine  100 mg Per Tube Daily  . topiramate  200 mg Per Tube BID   Continuous Infusions: . sodium chloride 10 mL/hr at 08/18/17 0600  . lacosamide (VIMPAT) IV Stopped (08/19/17 0347)  . levETIRAcetam Stopped (08/19/17 0457)  . phenytoin (DILANTIN) IV 250 mg (08/19/17 0550)     LOS: 17 days    Time spent: 40 minutes    Dechelle Attaway, Geraldo Docker, MD [facility] Pager [phone]   If 7PM-7AM, please contact night-coverage [email or web address] Password [facility] 08/19/2017, 9:54 AM

## 2017-08-19 NOTE — Progress Notes (Signed)
Subjective: No significant change.   Exam: Vitals:   08/19/17 0600 08/19/17 0823  BP: (!) 158/73   Pulse: 87   Resp: (!) 24   Temp: (!) 96.8 F (36 C)   SpO2: 98% 99%   Gen: In bed, tracheostomy in place Resp: ventilated.  Abd: soft, nt  Neuro: MS: Opens eyes spontaneously. She does not follow commands, does appears to fixate once, but this is by no means definite. Does nto track  FI:EPPIR, eyes are slightly dysconjugate Motor: withdraws vs flexion  to noxious stimulation in all 4 extremities Sensory: as above   Pertinent Labs: PHT   Impression: 62 yo  F with refractory status epilepticus due to seizure medication non-compliance, hyperosmolar state, and cocaine use. She was in subtle convulsive status epilepticus on arrival and presumably was in this state for some time as evidenced by the condition in which she was found. She had not been seen for two days prior to being found.   Her seizures were super refractory despite high doses of propofol. She was changed to pentobarbital on 1/27 due to continued seizures and reaching a maximum dose on propofol. Since weaning pentobarbital , no further seizures.   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   She has been stable for the past week, just gradually improving. At this point, I think that any improvement will be very gradual over the coming weeks and will likely be incomplete. This has been discussed with the daughter.    Recommendations: 1) Appreciate pharmacy following dilantin. I suspect she will continue to need frequent levels as her pentobarbital induced increase in metabolism calms down. Goal corrected level 15 - 20.  2) Continue keppra 1g Q12H 3) continue vimpat 200mg  BID 4) continue topamax 200mg  BID 5) I would not wean down her AEDs for at least a couple of months given the severely refractory nature of her seizures.  6) No further recommendations  at this time. Please call with further questions or concerns.   Roland Rack, MD [facility] [phone]  If 7pm- 7am, please page neurology on call as listed in [place].

## 2017-08-19 NOTE — Progress Notes (Signed)
PULMONARY / CRITICAL CARE MEDICINE   Name: [name] MRN: [id number] DOB: 1956-07-07    ADMISSION DATE:  08/02/2017 CONSULTATION DATE:  08/02/2017  CHIEF COMPLAINT:  Found down  HISTORY OF PRESENT ILLNESS:   62 y/o female with a history of left thalamic hemorrhage previously who was admitted with seizure activity.     SUBJECTIVE:  Failed weaning this AM again.  No events overnight.   VITAL SIGNS: BP 138/65   Pulse 89   Temp (!) 96.8 F (36 C)   Resp (!) 24   Ht 5\' 5"  (1.651 m)   Wt 88.4 kg (194 lb 14.2 oz)   SpO2 99%   BMI 32.43 kg/m   HEMODYNAMICS:    VENTILATOR SETTINGS: Vent Mode: PRVC FiO2 (%):  [30 %] 30 % Set Rate:  [24 bmp] 24 bmp Vt Set:  [500 mL] 500 mL PEEP:  [5 cmH20] 5 cmH20 Plateau Pressure:  [21 cmH20-26 cmH20] 26 cmH20  INTAKE / OUTPUT: I/O last 3 completed shifts: In: 1400 [I.V.:215; NG/GT:1060; IV Piggyback:125] Out: 2580 [Urine:2530; Stool:50]  PHYSICAL EXAMINATION:  General:  Chronically ill appearing female, NAD HENT: [place]/AT, PERRL, EOM-I and MMM PULM: Coarse BS diffusely CV: RRR, Nl S1/S2 and -M/R/G. GI: Soft, NT, ND and +BS MSK: -edema and -tenderness Neuro: On vent  LABS:  BMET Recent Labs  Lab 08/17/17 0630 08/18/17 0439 08/19/17 0450  NA 145 146* 147*  K 3.0* 2.8* 3.2*  CL 116* 118* 119*  CO2 15* 16* 16*  BUN 34* 24* 21*  CREATININE 1.20* 1.00 0.98  GLUCOSE 188* 95 131*   Electrolytes Recent Labs  Lab 08/17/17 0630 08/18/17 0439 08/19/17 0450  CALCIUM 8.3* 8.2* 8.3*  MG  --   --  1.7  PHOS  --   --  3.8   CBC Recent Labs  Lab 08/13/17 0548 08/16/17 0829 08/19/17 0450  WBC 17.4* 14.1* 17.5*  HGB 9.6* 9.9* 9.9*  HCT 30.4* 30.3* 31.1*  PLT 285 359 535*   Coag's Recent Labs  Lab 08/17/17 0948  INR 1.64   Sepsis Markers No results for input(s): LATICACIDVEN, PROCALCITON, O2SATVEN in the last 168 hours.  ABG No results for input(s): PHART, PCO2ART, PO2ART in the last 168 hours.  Liver  Enzymes Recent Labs  Lab 08/16/17 0829  ALBUMIN 1.4*   Cardiac Enzymes No results for input(s): TROPONINI, PROBNP in the last 168 hours.  Glucose Recent Labs  Lab 08/18/17 1222 08/18/17 1641 08/18/17 2019 08/19/17 0005 08/19/17 0416 08/19/17 0805  GLUCAP 186* 139* 118* 72 121* 89   Imaging No results found. STUDIES:  EEG 1/24>>>This is an abnormal EEG.There is evidence of moderate generalized slowing of brain activity as well as severe focal slowing in the left temporal lobe. The patient is not in non-convulsive status epilepticus. 2D echo 1/25>>> EF 12-45%, grade 2 diastolic dysfunction  MRI brain 1/25>>> 1. No acute intracranial abnormality. 2. 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 3. Remote hemorrhage in the left thalamus and left pons. 4. Age advanced atrophy. 5. Fluid in the nasopharynx and paranasal sinuses is likely secondary to intubation.  CULTURES: Blood Cx 1/30>>> Respiratory 1/30>>>Psuedomonas   ANTIBIOTICS: Vancomycin 1/30>>> 1/31 Zosyn 1/30>>> 2/5  LINES/TUBES: CVC 1/24>>>2/6 ETT 1/24>>>2/6 NG/OG 1/24>>> Tracheostomy 2/6>> PICC 2/6 >>  I reviewed CXR myself, trach is in good position.  DISCUSSION: 62 y/o female with a history of stroke, cocaine abuse here with status epilepticus.  MRI brain over weekend showed watershed infarct. Patient will have tracheostomy  and PEG placed by the end of this week in anticipation of post ICU care.  ASSESSMENT / PLAN:  PULMONARY A: Acute respiratory failure with hypoxemia-Improving HCAP resolving Acute Pulm edema-Stable Tracheostomy in place  P:   Continue full vent support Will likely need a vent SNF at this point VAP prevention Daily WUA/SBT Continue Duoneb  CARDIOVASCULAR A:  HTN- Patient continue to have elevated BP despite aggressive regimen. Patient on 300 ml of water q6 for hypernatremia which is resolving. Likely contributing to increase BP in the setting of recent  stroke. Grade 2 diastolic dysfunction, chronic diastolic heart failure Cocaine abuse with positive urine drug screen  P:  Continue free water to 300 q4 Amlodipine 10 mg daily Labetalol 400 mg tid, 5-10 mg PRN q10 min Enalapril 20 mg Hydralazine 50 mg q8  RENAL A:   Hypernatremia > resolving Hypokalemia 3.3>>3.2 P:   Monitor BMET and UOP Replace electrolytes as needed Free water as above Hold further lasix for now given hypokalemia  GASTROINTESTINAL A:   No acute issues. Evaluated and consented by IR for g tube placement. P:   Continue tube feeding Continue pantoprazole  HEMATOLOGIC A:   Anemia, no bleeding P:  Monitor for bleeding  INFECTIOUS A:   Pseudomonas Pneumonia P:   D/C abx Monitor WBC and fever curves   ENDOCRINE A:   Hyperglycemia  P:   SSI  NEUROLOGIC A:   Watershed infarct Acute encephalopathy Status epilepticus P:   RASS goal 0 to -1 Fentanyl for vent synchrony Hold propofol Anti-epileptics per neuro  SOCIAL CSW to look for vent SNF  FAMILY  - Updates: No family bedside to update.  - Inter-disciplinary family meet or Palliative Care meeting due by:  day 7  Discussed with PCCM-NP.  Rush Farmer, M.D. [facility] Pulmonary/Critical Care Medicine. Pager: [phone]. After hours pager: [phone].  08/19/2017, 11:19 AM

## 2017-08-19 NOTE — Progress Notes (Signed)
Referring Physician(s): Dr. Simonne Maffucci  Supervising Physician: Aletta Edouard  Patient Status:  [facility] - In-pt  Chief Complaint:  Ventilator dependence  Subjective:  Patient is non-verbal  Allergies: Patient has no known allergies.  Medications: Prior to Admission medications   Medication Sig Start Date End Date Taking? Authorizing Provider  amLODipine (NORVASC) 10 MG tablet Take 1 tablet (10 mg total) daily by mouth. 05/15/17  Yes Barton Dubois, MD  collagenase (SANTYL) ointment Apply topically daily. Apply Santyl to right leg wound Q day, then cover with moist gauze and dry gauze and kerlex 06/05/17  Yes Alekh, Kshitiz, MD  furosemide (LASIX) 20 MG tablet Take 20 mg by mouth daily. 07/31/17  Yes [provider]  gabapentin (NEURONTIN) 300 MG capsule Take 1 capsule (300 mg total) 3 (three) times daily by mouth. 05/15/17  Yes Barton Dubois, MD  insulin aspart protamine- aspart (NOVOLOG MIX 70/30) (70-30) 100 UNIT/ML injection Inject 0.2 mLs (20 Units total) into the skin 2 (two) times daily with a meal. 06/04/17  Yes Aline August, MD  levETIRAcetam (KEPPRA) 500 MG tablet Take 1 tablet (500 mg total) 2 (two) times daily by mouth. 05/15/17  Yes Barton Dubois, MD  metoprolol tartrate (LOPRESSOR) 25 MG tablet Take 25 mg by mouth 2 (two) times daily. 07/30/17  Yes [provider]  acetaminophen (TYLENOL) 325 MG tablet Take 2 tablets (650 mg total) every 6 (six) hours as needed by mouth for mild pain, fever or headache. 05/15/17   Barton Dubois, MD  atorvastatin (LIPITOR) 40 MG tablet Take 1 tablet (40 mg total) daily at 6 PM by mouth. 05/15/17   Barton Dubois, MD  cyclobenzaprine (FLEXERIL) 10 MG tablet Take 1 tablet (10 mg total) at bedtime by mouth. 05/15/17   Barton Dubois, MD  folic acid (FOLVITE) 1 MG tablet Take 1 tablet (1 mg total) by mouth daily. 06/05/17   Aline August, MD  Multiple Vitamin (MULTIVITAMIN WITH MINERALS) TABS tablet Take 1 tablet by  mouth daily. 06/20/16   Angiulli, Lavon Paganini, PA-C  nystatin cream (MYCOSTATIN) Apply topically 2 (two) times daily. 06/04/17   Aline August, MD  oxybutynin (DITROPAN-XL) 5 MG 24 hr tablet Take 1 tablet (5 mg total) at bedtime by mouth. 05/15/17   Barton Dubois, MD  pantoprazole (PROTONIX) 40 MG tablet Take 1 tablet (40 mg total) by mouth daily. 06/04/17   Aline August, MD  PROAIR HFA 108 (90 Base) MCG/ACT inhaler Inhale 2 puffs into the lungs 4 (four) times daily as needed for wheezing. 10/18/16   [provider]  thiamine 100 MG tablet Take 1 tablet (100 mg total) by mouth daily. 06/05/17   Aline August, MD     Vital Signs: BP (!) 158/73   Pulse 87   Temp (!) 96.8 F (36 C)   Resp (!) 24   Ht 5\' 5"  (1.651 m)   Wt 194 lb 14.2 oz (88.4 kg)   SpO2 99%   BMI 32.43 kg/m   Physical Exam Barely opens eyes to voice. Abdomen soft G-tube in place Site looks good  Imaging: Ct Abdomen Wo Contrast  Result Date: 08/16/2017 CLINICAL DATA:  Cerebral infarct, respiratory failure and seizure. Evaluation of anatomy prior to possible percutaneous gastrostomy tube placement. EXAM: CT ABDOMEN WITHOUT CONTRAST TECHNIQUE: Multidetector CT imaging of the abdomen was performed following the standard protocol without IV contrast. COMPARISON:  05/10/2017 FINDINGS: Lower chest: Bilateral lower lobe atelectasis/consolidation present. Hepatobiliary: The left lobe of the liver does cross the midline.  The gallbladder appears contracted. No gross biliary ductal dilatation. Pancreas: Unenhanced appearance of the pancreas is unremarkable. Spleen: Normal in size. Adrenals/Urinary Tract: No adrenal mass.  No hydronephrosis. Stomach/Bowel: The upper stomach is largely posterior to the left lobe of the liver. The transverse portion of the stomach lies inferior to the liver. This portion of the stomach does lie superior to the transverse colon but in the decompressed state is in very close proximity to the colon.  With air insufflation, puncture of the stomach without traversing the colon should be possible under fluoroscopy. There is no evidence ileus or small bowel obstruction. No free air is identified. Vascular/Lymphatic: The abdominal aorta is calcified without evidence of aneurysm. No enlarged lymph nodes are identified in the abdomen. Other: No visualized hernias or ascites. There is some degree of body wall edema/anasarca. No focal fluid collections identified. Musculoskeletal: No acute or significant osseous findings. IMPRESSION: 1. The left lobe of the liver crosses the midline and does lie anterior to the proximal stomach. The transverse portion of the stomach does lie immediately superior to the transverse colon when decompressed. With air insufflation, the stomach should be able to be approached safely for gastrostomy tube placement. 2. Bilateral lower lobe atelectasis/consolidation 3. Aortic atherosclerosis without aneurysm. 4. Body wall edema/anasarca. Electronically Signed   By: Aletta Edouard M.D.   On: 08/16/2017 07:52   Dg Abd 1 View  Result Date: 08/15/2017 CLINICAL DATA:  62 y/o  F; nasogastric tube placement. EXAM: ABDOMEN - 1 VIEW COMPARISON:  05/10/2017 CT abdomen and pelvis. FINDINGS: Left hemiabdomen is included within the field of view. Enteric tube tip projects over the gastric body. Normal visible bowel gas pattern. Lumbar spine levocurvature. IMPRESSION: Enteric tube tip projects over gastric body. Electronically Signed   By: Kristine Garbe M.D.   On: 08/15/2017 21:07   Ir Gastrostomy Tube Mod Sed  Result Date: 08/17/2017 INDICATION: 62 year old female with a history of dysphagia EXAM: PERC PLACEMENT GASTROSTOMY MEDICATIONS: 1 g Ancef ANESTHESIA/SEDATION: Versed 0.5 mg IV; Fentanyl 25 mcg IV Moderate Sedation Time:  10 minutes The patient was continuously monitored during the procedure by the interventional radiology nurse under my direct supervision. CONTRAST:  10 cc  administered into the gastric lumen. FLUOROSCOPY TIME:  Fluoroscopy Time: 1 minute COMPLICATIONS: None PROCEDURE: Informed written consent was obtained from the patient's family after a thorough discussion of the procedural risks, benefits and alternatives. All questions were addressed. Maximal Sterile Barrier Technique was utilized including caps, mask, sterile gowns, sterile gloves, sterile drape, hand hygiene and skin antiseptic. A timeout was performed prior to the initiation of the procedure. The procedure, risks, benefits, and alternatives were explained to the patient. Questions regarding the procedure were encouraged and answered. The patient understands and consents to the procedure. The epigastrium was prepped with Betadine in a sterile fashion, and a sterile drape was applied covering the operative field. A sterile gown and sterile gloves were used for the procedure. A 5-French orogastric tube is placed under fluoroscopic guidance. Scout imaging of the abdomen confirms barium within the transverse colon. The stomach was distended with gas. Under fluoroscopic guidance, an 18 gauge needle was utilized to puncture the anterior wall of the body of the stomach. An Amplatz wire was advanced through the needle passing a T fastener into the lumen of the stomach. The T fastener was secured for gastropexy. A 9-French sheath was inserted. A snare was advanced through the 9-French sheath. A Britta Mccreedy was advanced through the orogastric tube. It was  snared then pulled out the oral cavity, pulling the snare, as well. The leading edge of the gastrostomy was attached to the snare. It was then pulled down the esophagus and out the percutaneous site. It was secured in place. Contrast was injected. No complication IMPRESSION: Status post fluoroscopic placed percutaneous gastrostomy tube, with 20 [place] pull-through. Signed, Dulcy Fanny. Earleen Newport, DO [facility] Radiology  Electronically Signed   By: Corrie Mckusick D.O.   On: 08/17/2017 11:46   Dg Chest Port 1 View  Result Date: 08/16/2017 CLINICAL DATA:  Tracheostomy EXAM: PORTABLE CHEST 1 VIEW COMPARISON:  08/15/2017 FINDINGS: Tracheostomy remains in good position and unchanged. Central venous catheter tip in the lower SVC also unchanged. No pneumothorax. Interval placement of NG tube with the tip not visualized. NG tube passes through the stomach. Bibasilar airspace disease similar to the prior study.  No effusion. IMPRESSION: Interval placement of NG tube.  Tracheostomy unchanged in position. Bibasilar airspace disease similar to yesterday. Electronically Signed   By: Franchot Gallo M.D.   On: 08/16/2017 07:04   Dg Chest Port 1 View  Result Date: 08/15/2017 CLINICAL DATA:  Acute respiratory failure EXAM: PORTABLE CHEST 1 VIEW COMPARISON:  08/15/2017 FINDINGS: Interval placement tracheostomy tube which projects over the mid trachea. No pneumothorax. Left central line is unchanged. Interval placement of right PICC line with the tip in the SVC. Interval removal of the NG tube. Bibasilar airspace opacities are again noted. No visible effusions. Heart is mildly enlarged. IMPRESSION: Interval tracheostomy placement.  No pneumothorax. Right PICC line tip in the SVC. Stable bibasilar atelectasis or infiltrates. Electronically Signed   By: Rolm Baptise M.D.   On: 08/15/2017 15:38   Dg Abd Portable 1v  Result Date: 08/17/2017 CLINICAL DATA:  Feeding tube placement EXAM: PORTABLE ABDOMEN - 1 VIEW COMPARISON:  08/17/2017, 08/15/2017 FINDINGS: Esophageal tube tip projects over the mid stomach, side-port projects over the gastric fundus. Contrast material within the bowel. Gastrostomy balloon visualized in the mid abdominal region, projecting over distal gastric region. Pelvic/rectal probe noted IMPRESSION: 1. Gastrostomy balloon projects over anticipated location of distal stomach; there is residual contrast in the bowel 2. Esophageal  tube tip and side-port project over the proximal to mid stomach. Electronically Signed   By: Donavan Foil M.D.   On: 08/17/2017 18:18    Labs:  CBC: Recent Labs    08/12/17 0424 08/13/17 0548 08/16/17 0829 08/19/17 0450  WBC 17.2* 17.4* 14.1* 17.5*  HGB 10.1* 9.6* 9.9* 9.9*  HCT 31.3* 30.4* 30.3* 31.1*  PLT 262 285 359 535*    COAGS: Recent Labs    10/31/16 1513 05/10/17 1933 05/25/17 1955 08/17/17 0948  INR 1.09 1.11 1.28 1.64    BMP: Recent Labs    08/16/17 0603 08/17/17 0630 08/18/17 0439 08/19/17 0450  NA 144 145 146* 147*  K 3.2* 3.0* 2.8* 3.2*  CL 116* 116* 118* 119*  CO2 17* 15* 16* 16*  GLUCOSE 112* 188* 95 131*  BUN 37* 34* 24* 21*  CALCIUM 8.4* 8.3* 8.2* 8.3*  CREATININE 1.07* 1.20* 1.00 0.98  GFRNONAA 55* 48* 60* >60  GFRAA >60 56* >60 >60    LIVER FUNCTION TESTS: Recent Labs    08/02/17 1347 08/04/17 0454 08/07/17 0726 08/08/17 0342 08/16/17 0829  BILITOT 2.2* 0.5 0.2* 0.5  --   AST 57* 18 30 24   --   ALT 20 9* 14 12*  --   ALKPHOS 55 37* 62 67  --  PROT 7.4 5.4* 6.7 6.7  --   ALBUMIN 2.8* 2.0* 1.8* 1.6* 1.4*    Assessment and Plan:  Ventilator dependence with need for enteral nutrition.  S/P gastrostomy tube placement by Dr. Earleen Newport 08/17/2016  Routine Gtube care.  Electronically Signed: Murrell Redden, PA-C 08/19/2017, 9:15 AM   I spent a total of 15 Minutes at the the patient's bedside AND on the patient's hospital floor or unit, greater than 50% of which was counseling/coordinating care for f/u after gtube.

## 2017-08-20 LAB — CBC
HEMATOCRIT: 29.5 % — AB (ref 36.0–46.0)
Hemoglobin: 9.2 g/dL — ABNORMAL LOW (ref 12.0–15.0)
MCH: 30.2 pg (ref 26.0–34.0)
MCHC: 31.2 g/dL (ref 30.0–36.0)
MCV: 96.7 fL (ref 78.0–100.0)
Platelets: 583 10*3/uL — ABNORMAL HIGH (ref 150–400)
RBC: 3.05 MIL/uL — AB (ref 3.87–5.11)
RDW: 16.2 % — ABNORMAL HIGH (ref 11.5–15.5)
WBC: 14.4 10*3/uL — AB (ref 4.0–10.5)

## 2017-08-20 LAB — GLUCOSE, CAPILLARY
GLUCOSE-CAPILLARY: 135 mg/dL — AB (ref 65–99)
GLUCOSE-CAPILLARY: 149 mg/dL — AB (ref 65–99)
GLUCOSE-CAPILLARY: 169 mg/dL — AB (ref 65–99)
Glucose-Capillary: 152 mg/dL — ABNORMAL HIGH (ref 65–99)
Glucose-Capillary: 160 mg/dL — ABNORMAL HIGH (ref 65–99)
Glucose-Capillary: 190 mg/dL — ABNORMAL HIGH (ref 65–99)
Glucose-Capillary: 73 mg/dL (ref 65–99)

## 2017-08-20 LAB — PHENYTOIN LEVEL, TOTAL: Phenytoin Lvl: 11.8 ug/mL (ref 10.0–20.0)

## 2017-08-20 LAB — BASIC METABOLIC PANEL
ANION GAP: 12 (ref 5–15)
BUN: 17 mg/dL (ref 6–20)
CHLORIDE: 122 mmol/L — AB (ref 101–111)
CO2: 16 mmol/L — AB (ref 22–32)
CREATININE: 1.01 mg/dL — AB (ref 0.44–1.00)
Calcium: 8.4 mg/dL — ABNORMAL LOW (ref 8.9–10.3)
GFR calc non Af Amer: 59 mL/min — ABNORMAL LOW (ref >60)
GLUCOSE: 146 mg/dL — AB (ref 65–99)
Potassium: 3.4 mmol/L — ABNORMAL LOW (ref 3.5–5.1)
Sodium: 150 mmol/L — ABNORMAL HIGH (ref 135–145)

## 2017-08-20 LAB — PHOSPHORUS: Phosphorus: 3.3 mg/dL (ref 2.5–4.6)

## 2017-08-20 LAB — MAGNESIUM: Magnesium: 2.1 mg/dL (ref 1.7–2.4)

## 2017-08-20 MED ORDER — FREE WATER
350.0000 mL | Status: DC
  Administered 2017-08-20 – 2017-08-21 (×8): 350 mL

## 2017-08-20 MED ORDER — INSULIN GLARGINE 100 UNIT/ML ~~LOC~~ SOLN
20.0000 [IU] | Freq: Two times a day (BID) | SUBCUTANEOUS | Status: DC
  Administered 2017-08-20 (×2): 20 [IU] via SUBCUTANEOUS
  Filled 2017-08-20 (×3): qty 0.2

## 2017-08-20 MED ORDER — ENALAPRIL MALEATE 20 MG PO TABS
20.0000 mg | ORAL_TABLET | Freq: Every day | ORAL | Status: DC
  Administered 2017-08-20 – 2017-08-29 (×10): 20 mg
  Filled 2017-08-20 (×10): qty 1

## 2017-08-20 MED ORDER — POTASSIUM CHLORIDE 20 MEQ/15ML (10%) PO SOLN
50.0000 meq | Freq: Once | ORAL | Status: AC
  Administered 2017-08-20: 50 meq
  Filled 2017-08-20: qty 45

## 2017-08-20 MED ORDER — ASPIRIN 81 MG PO CHEW
81.0000 mg | CHEWABLE_TABLET | Freq: Every day | ORAL | Status: DC
  Administered 2017-08-20 – 2017-09-14 (×26): 81 mg
  Filled 2017-08-20 (×25): qty 1

## 2017-08-20 MED ORDER — SODIUM CHLORIDE 0.45 % IV SOLN
INTRAVENOUS | Status: DC
  Administered 2017-08-20 – 2017-09-07 (×7): via INTRAVENOUS

## 2017-08-20 MED ORDER — ALBUTEROL SULFATE (2.5 MG/3ML) 0.083% IN NEBU
2.5000 mg | INHALATION_SOLUTION | RESPIRATORY_TRACT | Status: DC | PRN
  Administered 2017-08-28 – 2017-08-30 (×5): 2.5 mg via RESPIRATORY_TRACT
  Filled 2017-08-20 (×5): qty 3

## 2017-08-20 MED ORDER — LABETALOL HCL 200 MG PO TABS
400.0000 mg | ORAL_TABLET | Freq: Three times a day (TID) | ORAL | Status: DC
  Administered 2017-08-20 – 2017-09-14 (×71): 400 mg
  Filled 2017-08-20 (×2): qty 2
  Filled 2017-08-20: qty 4
  Filled 2017-08-20 (×6): qty 2
  Filled 2017-08-20: qty 4
  Filled 2017-08-20 (×20): qty 2
  Filled 2017-08-20: qty 4
  Filled 2017-08-20 (×15): qty 2
  Filled 2017-08-20: qty 4
  Filled 2017-08-20 (×4): qty 2
  Filled 2017-08-20: qty 4
  Filled 2017-08-20 (×12): qty 2
  Filled 2017-08-20 (×2): qty 4
  Filled 2017-08-20 (×12): qty 2

## 2017-08-20 NOTE — Progress Notes (Signed)
   08/20/17 1300  Clinical Encounter Type  Visited With Patient  Visit Type Initial  Referral From Chaplain  Consult/Referral To Chaplain  Spiritual Encounters  Spiritual Needs Emotional  Stress Factors  Patient Stress Factors Exhausted  Family Stress Factors Exhausted    Pt was in her chair sitting besides her bed, awake and seemingly alert. No family present on-site but pt mentioned about good family support. Pt also mentioned about christian faith being a great sustenance in her life generally. Chaplain provided emotional support through reflective listening and compassionate presence.  Carly Applegate a Medical sales representative, [employer]

## 2017-08-20 NOTE — Progress Notes (Signed)
[place] TEAM 1 - Stepdown/ICU TEAM  Wynn Banker  JOI:[id number] DOB: July 09, 1956 DOA: 08/02/2017 PCP: Nolene Ebbs, MD    Brief Narrative:  62yo F w/ a Hx of polysubstance abuse (EtOH, Cocaine), DM, and HTN who had been previously admitted to this hospital for a left thalamic hemorrhage. She was found down the day of this admit in a puddle of stool and urine, and brought to the ED where she had overt clinical seizure activity.She was treated with Keppra fosphenytoin and Ativan resulting in arrest of the seizures.    Significant Events: 1/24 admit - intubated 1/25 TTE - EF 55-60% - grade 2 DD 2/6 tracheostomy  2/8 PEG tube in IR   Subjective: Pt is non-communicative.  She does not respond during my exam / questioning.  There is no family present in the room at the time of my visit.    Assessment & Plan:  Seizure - refractory status epilepticus  Due to non-compliance w/ seizure meds, hyperosmolar state, and cocaine use - potentially seizing for days upon arrival (had not been seen for 2 days prior to being found) - Neuro suggests no attempts to wean AEDs fro at least 2 months - signif injury to brain thought to have occurred   Pseudomonas pneumonia Completed course of treatment  Acute hypoxic respiratory failure Vent dependent - now s/p trach - vent per PCCM  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 in setting of HTN - refused SNF placement at that time   Chronic diastolic CHF No signif volume overload at this time   DM CBG controlled   HTN  BP reasonably controlled - follow w/o change   Hypernatremia  Increase enteral free water   Hypokalemia Supplement and follow   Normocytic Anemia No evidence of acute blood loss - follow  DVT prophylaxis: SCDs Code Status: FULL CODE Family Communication: no family present at time of exam  Disposition Plan: will need a vent capable SNF   Consultants:  Neurology PCCM  Antimicrobials:  Vancomycin 1/30 >  1/31 Zosyn 1/30 > 2/5  Objective: Blood pressure 124/61, pulse 90, temperature 97.7 F (36.5 C), resp. rate (!) 24, height 5' 5" (1.651 m), weight 93 kg (205 lb 0.4 oz), SpO2 100 %.  Intake/Output Summary (Last 24 hours) at 08/20/2017 0910 Last data filed at 08/20/2017 0600 Gross per 24 hour  Intake 2075 ml  Output 1120 ml  Net 955 ml   Filed Weights   08/18/17 0500 08/19/17 0454 08/20/17 0500  Weight: 91.5 kg (201 lb 11.5 oz) 88.4 kg (194 lb 14.2 oz) 93 kg (205 lb 0.4 oz)    Examination: General: No acute respiratory distress evident - unresponsive  Lungs: Clear to auscultation bilaterally without wheezes or crackles Cardiovascular: Regular rate and rhythm without murmur gallop or rub normal S1 and S2 Abdomen: Nontender, nondistended, soft, bowel sounds positive, PEG in place  Extremities: trace B LE edema   CBC: Recent Labs  Lab 08/16/17 0829 08/19/17 0450 08/20/17 0500  WBC 14.1* 17.5* 14.4*  HGB 9.9* 9.9* 9.2*  HCT 30.3* 31.1* 29.5*  MCV 95.0 96.6 96.7  PLT 359 535* 470*   Basic Metabolic Panel: Recent Labs  Lab 08/16/17 0603 08/17/17 0630 08/18/17 0439 08/19/17 0450 08/20/17 0500  NA 144 145 146* 147* 150*  K 3.2* 3.0* 2.8* 3.2* 3.4*  CL 116* 116* 118* 119* 122*  CO2 17* 15* 16* 16* 16*  GLUCOSE 112* 188* 95 131* 146*  BUN 37* 34* 24* 21* 17  CREATININE 1.07* 1.20* 1.00 0.98 1.01*  CALCIUM 8.4* 8.3* 8.2* 8.3* 8.4*  MG  --   --   --  1.7 2.1  PHOS  --   --   --  3.8 3.3   GFR: Estimated Creatinine Clearance: 66.8 mL/min (A) (by C-G formula based on SCr of 1.01 mg/dL (H)).  Liver Function Tests: Recent Labs  Lab 08/16/17 0829  ALBUMIN 1.4*   Coagulation Profile: Recent Labs  Lab 08/17/17 0948  INR 1.64    HbA1C: Hgb A1c MFr Bld  Date/Time Value Ref Range Status  05/26/2017 10:35 PM 14.6 (H) 4.8 - 5.6 % Final    Comment:    (NOTE) Pre diabetes:          5.7%-6.4% Diabetes:              >6.4% Glycemic control for   <7.0% adults with  diabetes   05/11/2017 08:08 AM 13.2 (H) 4.8 - 5.6 % Final    Comment:    (NOTE) Pre diabetes:          5.7%-6.4% Diabetes:              >6.4% Glycemic control for   <7.0% adults with diabetes     CBG: Recent Labs  Lab 08/19/17 1602 08/19/17 1911 08/20/17 0019 08/20/17 0348 08/20/17 0744  GLUCAP 66 97 152* 135* 149*    Scheduled Meds: . amLODipine  10 mg Per Tube Daily  . aspirin EC  81 mg Oral Daily  . bisacodyl  10 mg Rectal Daily  . chlorhexidine gluconate (MEDLINE KIT)  15 mL Mouth Rinse BID  . Chlorhexidine Gluconate Cloth  6 each Topical Daily  . docusate  50 mg Per Tube Daily  . enalapril  20 mg Oral Daily  . feeding supplement (PRO-STAT SUGAR FREE 64)  30 mL Per Tube BID  . feeding supplement (VITAL HIGH PROTEIN)  1,000 mL Per Tube Q24H  . free water  300 mL Per Tube Q4H  . heparin  5,000 Units Subcutaneous Q8H  . hydrALAZINE  50 mg Per Tube Q8H  . insulin aspart  0-20 Units Subcutaneous Q4H  . insulin aspart  6 Units Subcutaneous Q4H  . insulin glargine  25 Units Subcutaneous Daily  . ipratropium-albuterol  3 mL Nebulization Q6H  . labetalol  400 mg Oral TID  . mouth rinse  15 mL Mouth Rinse 10 times per day  . mupirocin cream   Topical Daily  . neomycin-bacitracin-polymyxin  1 application Topical Daily  . pantoprazole sodium  40 mg Per Tube QHS  . sodium chloride flush  10-40 mL Intracatheter Q12H  . thiamine  100 mg Per Tube Daily  . topiramate  200 mg Per Tube BID     LOS: 18 days   Cherene Altes, MD [facility] Office  [phone] Pager - Text Page per Amion as per below:  On-Call/Text Page:      [email or web address]      password [email or web address]  If 7PM-7AM, please contact night-coverage [email or web address] Password [email or web address] 08/20/2017, 9:10 AM

## 2017-08-20 NOTE — Progress Notes (Signed)
CSW still following pt for needs at this time.    Virgie Dad Insiya Oshea, MSW, [facility] [place] [facility] Clinical Social Worker [phone]

## 2017-08-20 NOTE — Progress Notes (Signed)
PULMONARY / CRITICAL CARE MEDICINE   Name: [name] MRN: [id number] DOB: 24-Dec-1955    ADMISSION DATE:  08/02/2017 CONSULTATION DATE:  08/02/2017  CHIEF COMPLAINT:  Found down  HISTORY OF PRESENT ILLNESS:   62 y/o female with a history of left thalamic hemorrhage previously who was admitted with seizure activity.     SUBJECTIVE:  Failed weaning this AM again.  No events overnight.   VITAL SIGNS: BP 131/62   Pulse 91   Temp 97.7 F (36.5 C)   Resp (!) 24   Ht 5\' 5"  (1.651 m)   Wt 205 lb 0.4 oz (93 kg)   SpO2 100%   BMI 34.12 kg/m   HEMODYNAMICS:    VENTILATOR SETTINGS: Vent Mode: PRVC FiO2 (%):  [30 %] 30 % Set Rate:  [24 bmp] 24 bmp Vt Set:  [500 mL] 500 mL PEEP:  [5 cmH20] 5 cmH20 Pressure Support:  [12 cmH20] 12 cmH20 Plateau Pressure:  [20 cmH20-26 cmH20] 23 cmH20  INTAKE / OUTPUT: I/O last 3 completed shifts: In: 2040 [I.V.:210; Other:80; NG/GT:1200; IV Piggyback:550] Out: 2220 [Urine:2220]  PHYSICAL EXAMINATION:  General:  Chronically ill appearing female, NAD HENT: [place]/AT, PERRL, EOM-I and MMM PULM: Coarse BS diffusely CV: RRR, Nl S1/S2 and -M/R/G. GI: Soft, NT, ND and +BS MSK: -edema and -tenderness Neuro: On vent  LABS:  BMET Recent Labs  Lab 08/17/17 0630 08/18/17 0439 08/19/17 0450  NA 145 146* 147*  K 3.0* 2.8* 3.2*  CL 116* 118* 119*  CO2 15* 16* 16*  BUN 34* 24* 21*  CREATININE 1.20* 1.00 0.98  GLUCOSE 188* 95 131*   Electrolytes Recent Labs  Lab 08/17/17 0630 08/18/17 0439 08/19/17 0450  CALCIUM 8.3* 8.2* 8.3*  MG  --   --  1.7  PHOS  --   --  3.8   CBC Recent Labs  Lab 08/16/17 0829 08/19/17 0450 08/20/17 0500  WBC 14.1* 17.5* 14.4*  HGB 9.9* 9.9* 9.2*  HCT 30.3* 31.1* 29.5*  PLT 359 535* 583*   Coag's Recent Labs  Lab 08/17/17 0948  INR 1.64   Sepsis Markers No results for input(s): LATICACIDVEN, PROCALCITON, O2SATVEN in the last 168 hours.  ABG No results for input(s): PHART, PCO2ART, PO2ART  in the last 168 hours.  Liver Enzymes Recent Labs  Lab 08/16/17 0829  ALBUMIN 1.4*   Cardiac Enzymes No results for input(s): TROPONINI, PROBNP in the last 168 hours.  Glucose Recent Labs  Lab 08/19/17 0805 08/19/17 1208 08/19/17 1602 08/19/17 1911 08/20/17 0019 08/20/17 0348  GLUCAP 89 133* 66 97 152* 135*   Imaging No results found. STUDIES:  EEG 1/24>>>This is an abnormal EEG.There is evidence of moderate generalized slowing of brain activity as well as severe focal slowing in the left temporal lobe. The patient is not in non-convulsive status epilepticus. 2D echo 1/25>>> EF 01-09%, grade 2 diastolic dysfunction  MRI brain 1/25>>> 1. No acute intracranial abnormality. 2. 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 3. Remote hemorrhage in the left thalamus and left pons. 4. Age advanced atrophy. 5. Fluid in the nasopharynx and paranasal sinuses is likely secondary to intubation.  CULTURES: Blood Cx 1/30>>> Respiratory 1/30>>>Psuedomonas   ANTIBIOTICS: Vancomycin 1/30>>> 1/31 Zosyn 1/30>>> 2/5  LINES/TUBES: CVC 1/24>>>2/6 ETT 1/24>>>2/6 NG/OG 1/24>>> Tracheostomy 2/6>> PICC 2/6 >>  I reviewed CXR myself, trach is in good position.  DISCUSSION: 62 y/o female with a history of stroke, cocaine abuse here with status epilepticus.  MRI brain over weekend showed  watershed infarct. Patient will have tracheostomy and PEG placed by the end of this week in anticipation of post ICU care.  ASSESSMENT / PLAN:  PULMONARY A: Acute respiratory failure with hypoxemia-Improving HCAP resolving Acute Pulm edema-Stable Tracheostomy in place  P:   Continue full vent support Will likely need a vent SNF at this point VAP prevention Daily WUA/SBT Continue Duoneb  CARDIOVASCULAR A:  HTN- Patient continue to have elevated BP despite aggressive regimen. Patient on 300 ml of water q6 for hypernatremia which is resolving. Likely contributing to increase BP  in the setting of recent stroke. Grade 2 diastolic dysfunction, chronic diastolic heart failure Cocaine abuse with positive urine drug screen  P:  Continue free water to 300 q4 Amlodipine 10 mg daily Labetalol 400 mg tid, 5-10 mg PRN q10 min Enalapril 20 mg Hydralazine 50 mg q8  RENAL A:   Hypernatremia > resolving Hypokalemia 3.3>>3.2 P:   Monitor BMET and UOP Replace electrolytes as needed Free water as above Hold further lasix for now given hypokalemia  GASTROINTESTINAL A:   No acute issues. Evaluated and consented by IR for g tube placement. P:   Continue tube feeding Continue pantoprazole  HEMATOLOGIC A:   Anemia, no bleeding P:  Monitor for bleeding  INFECTIOUS A:   Pseudomonas Pneumonia P:   D/C abx Monitor WBC and fever curves   ENDOCRINE A:   Hyperglycemia  P:   SSI  NEUROLOGIC A:   Watershed infarct Acute encephalopathy Status epilepticus P:   RASS goal 0 to -1 Fentanyl for vent synchrony Hold propofol Anti-epileptics per neuro  SOCIAL CSW to look for vent SNF  FAMILY  - Updates: No family bedside to update.  - Inter-disciplinary family meet or Palliative Care meeting due by:  day 7  Discussed with PCCM-NP.  Marjie Skiff, MD [place], PGY-2   08/20/2017, 6:53 AM

## 2017-08-21 LAB — GLUCOSE, CAPILLARY
GLUCOSE-CAPILLARY: 188 mg/dL — AB (ref 65–99)
Glucose-Capillary: 159 mg/dL — ABNORMAL HIGH (ref 65–99)
Glucose-Capillary: 173 mg/dL — ABNORMAL HIGH (ref 65–99)
Glucose-Capillary: 178 mg/dL — ABNORMAL HIGH (ref 65–99)
Glucose-Capillary: 208 mg/dL — ABNORMAL HIGH (ref 65–99)

## 2017-08-21 LAB — COMPREHENSIVE METABOLIC PANEL
ALT: 29 U/L (ref 14–54)
ANION GAP: 9 (ref 5–15)
AST: 51 U/L — ABNORMAL HIGH (ref 15–41)
Albumin: 1.3 g/dL — ABNORMAL LOW (ref 3.5–5.0)
Alkaline Phosphatase: 61 U/L (ref 38–126)
BUN: 25 mg/dL — ABNORMAL HIGH (ref 6–20)
CHLORIDE: 124 mmol/L — AB (ref 101–111)
CO2: 16 mmol/L — ABNORMAL LOW (ref 22–32)
Calcium: 8.2 mg/dL — ABNORMAL LOW (ref 8.9–10.3)
Creatinine, Ser: 1.19 mg/dL — ABNORMAL HIGH (ref 0.44–1.00)
GFR, EST AFRICAN AMERICAN: 56 mL/min — AB (ref >60)
GFR, EST NON AFRICAN AMERICAN: 49 mL/min — AB (ref >60)
Glucose, Bld: 233 mg/dL — ABNORMAL HIGH (ref 65–99)
POTASSIUM: 3.7 mmol/L (ref 3.5–5.1)
Sodium: 149 mmol/L — ABNORMAL HIGH (ref 135–145)
Total Bilirubin: 0.3 mg/dL (ref 0.3–1.2)
Total Protein: 6.3 g/dL — ABNORMAL LOW (ref 6.5–8.1)

## 2017-08-21 MED ORDER — INSULIN GLARGINE 100 UNIT/ML ~~LOC~~ SOLN
24.0000 [IU] | Freq: Two times a day (BID) | SUBCUTANEOUS | Status: DC
  Administered 2017-08-21 – 2017-08-25 (×9): 24 [IU] via SUBCUTANEOUS
  Filled 2017-08-21 (×11): qty 0.24

## 2017-08-21 MED ORDER — LEVETIRACETAM 100 MG/ML PO SOLN
1000.0000 mg | Freq: Two times a day (BID) | ORAL | Status: DC
Start: 2017-08-21 — End: 2017-09-14
  Administered 2017-08-21 – 2017-09-14 (×48): 1000 mg
  Filled 2017-08-21 (×50): qty 10

## 2017-08-21 MED ORDER — LACOSAMIDE 200 MG PO TABS
200.0000 mg | ORAL_TABLET | Freq: Two times a day (BID) | ORAL | Status: DC
  Administered 2017-08-21 – 2017-08-23 (×6): 200 mg via ORAL
  Filled 2017-08-21 (×2): qty 1
  Filled 2017-08-21: qty 4
  Filled 2017-08-21 (×3): qty 1

## 2017-08-21 MED ORDER — FREE WATER
400.0000 mL | Status: DC
  Administered 2017-08-21 – 2017-08-24 (×14): 400 mL

## 2017-08-21 MED ORDER — PHENYTOIN 125 MG/5ML PO SUSP
250.0000 mg | Freq: Four times a day (QID) | ORAL | Status: DC
Start: 2017-08-21 — End: 2017-09-13
  Administered 2017-08-21 – 2017-09-12 (×86): 250 mg
  Filled 2017-08-21 (×96): qty 10

## 2017-08-21 MED ORDER — IPRATROPIUM-ALBUTEROL 0.5-2.5 (3) MG/3ML IN SOLN
RESPIRATORY_TRACT | Status: AC
  Filled 2017-08-21: qty 3

## 2017-08-21 NOTE — Progress Notes (Signed)
[place] TEAM 1 - Stepdown/ICU TEAM  Wynn Banker  FWY:[id number] DOB: 03/13/1956 DOA: 08/02/2017 PCP: Nolene Ebbs, MD    Brief Narrative:  62yo F w/ a Hx of polysubstance abuse (EtOH, Cocaine), DM, and HTN who had been previously admitted to this hospital for a left thalamic hemorrhage. She was found down the day of this admit in a puddle of stool and urine, and brought to the ED where she had overt clinical seizure activity.She was treated with Keppra fosphenytoin and Ativan resulting in arrest of the seizures.    Significant Events: 1/24 admit - intubated 1/25 TTE - EF 55-60% - grade 2 DD 2/6 tracheostomy  2/8 PEG tube in IR  2/11 TR[facility] assumed care   Subjective: The patient remains noncommunicative.  She does not interact with staff.  She does not appear uncomfortable.  There is no evidence of respiratory distress.  Assessment & Plan:  Seizure - refractory status epilepticus  Due to non-compliance w/ seizure meds, hyperosmolar state, and cocaine use - potentially seizing for days upon arrival (had not been seen for 2 days prior to being found) - Neuro suggests no attempts to wean AEDs for at least 2 months - signif injury to brain thought to have occurred   Pseudomonas pneumonia Completed course of treatment - afebrile   Acute hypoxic respiratory failure Vent dependent - now s/p trach - vent per PCCM  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 in setting of HTN - refused SNF placement at that time   Chronic diastolic CHF Mildly volume overloaded on exam - hold diuresis in setting of hypernatremia and follow   Filed Weights   08/18/17 0500 08/19/17 0454 08/20/17 0500  Weight: 91.5 kg (201 lb 11.5 oz) 88.4 kg (194 lb 14.2 oz) 93 kg (205 lb 0.4 oz)    DM CBG trending upward - adjust tx and follow   HTN  BP reasonably controlled - follow w/o change   Hypernatremia  Increased enteral free water 2/11 - cont to follow   Hypokalemia Supplement and  follow to goal of 4.0  Normocytic Anemia No evidence of acute blood loss - following trend   DVT prophylaxis: SCDs Code Status: FULL CODE Family Communication: no family present at time of exam  Disposition Plan: will need a vent capable SNF   Consultants:  Neurology PCCM  Antimicrobials:  Vancomycin 1/30 > 1/31 Zosyn 1/30 > 2/5  Objective: Blood pressure 127/61, pulse 93, temperature 100.1 F (37.8 C), temperature source Oral, resp. rate (!) 24, height _0  (1.651 m), weight 93 kg (205 lb 0.4 oz), SpO2 100 %.  Intake/Output Summary (Last 24 hours) at 08/21/2017 0839 Last data filed at 08/21/2017 0400 Gross per 24 hour  Intake 1110 ml  Output 1500 ml  Net -390 ml   Filed Weights   08/18/17 0500 08/19/17 0454 08/20/17 0500  Weight: 91.5 kg (201 lb 11.5 oz) 88.4 kg (194 lb 14.2 oz) 93 kg (205 lb 0.4 oz)    Examination: General: No acute respiratory distress - does not respond to exam or voice  Lungs: CTA B - no wheezing  Cardiovascular: RRR - no gallup or rub  Abdomen: NT/ND, soft, PEG in place  Extremities: 1+ B LE edema   CBC: Recent Labs  Lab 08/16/17 0829 08/19/17 0450 08/20/17 0500  WBC 14.1* 17.5* 14.4*  HGB 9.9* 9.9* 9.2*  HCT 30.3* 31.1* 29.5*  MCV 95.0 96.6 96.7  PLT 359 535* 277*   Basic Metabolic Panel: Recent  Labs  Lab 08/16/17 0603 08/17/17 0630 08/18/17 0439 08/19/17 0450 08/20/17 0500  NA 144 145 146* 147* 150*  K 3.2* 3.0* 2.8* 3.2* 3.4*  CL 116* 116* 118* 119* 122*  CO2 17* 15* 16* 16* 16*  GLUCOSE 112* 188* 95 131* 146*  BUN 37* 34* 24* 21* 17  CREATININE 1.07* 1.20* 1.00 0.98 1.01*  CALCIUM 8.4* 8.3* 8.2* 8.3* 8.4*  MG  --   --   --  1.7 2.1  PHOS  --   --   --  3.8 3.3   GFR: Estimated Creatinine Clearance: 66.8 mL/min (A) (by C-G formula based on SCr of 1.01 mg/dL (H)).  Liver Function Tests: Recent Labs  Lab 08/16/17 0829  ALBUMIN 1.4*   Coagulation Profile: Recent Labs  Lab 08/17/17 0948  INR 1.64     HbA1C: Hgb A1c MFr Bld  Date/Time Value Ref Range Status  05/26/2017 10:35 PM 14.6 (H) 4.8 - 5.6 % Final    Comment:    (NOTE) Pre diabetes:          5.7%-6.4% Diabetes:              >6.4% Glycemic control for   <7.0% adults with diabetes   05/11/2017 08:08 AM 13.2 (H) 4.8 - 5.6 % Final    Comment:    (NOTE) Pre diabetes:          5.7%-6.4% Diabetes:              >6.4% Glycemic control for   <7.0% adults with diabetes     CBG: Recent Labs  Lab 08/20/17 1509 08/20/17 1939 08/20/17 2337 08/21/17 0322 08/21/17 0729  GLUCAP 169* 160* 190* 178* 188*    Scheduled Meds: . amLODipine  10 mg Per Tube Daily  . aspirin  81 mg Per Tube Daily  . bisacodyl  10 mg Rectal Daily  . chlorhexidine gluconate (MEDLINE KIT)  15 mL Mouth Rinse BID  . Chlorhexidine Gluconate Cloth  6 each Topical Daily  . docusate  50 mg Per Tube Daily  . enalapril  20 mg Per Tube Daily  . feeding supplement (PRO-STAT SUGAR FREE 64)  30 mL Per Tube BID  . feeding supplement (VITAL HIGH PROTEIN)  1,000 mL Per Tube Q24H  . free water  350 mL Per Tube Q4H  . heparin  5,000 Units Subcutaneous Q8H  . hydrALAZINE  50 mg Per Tube Q8H  . insulin aspart  0-20 Units Subcutaneous Q4H  . insulin glargine  20 Units Subcutaneous BID  . ipratropium-albuterol  3 mL Nebulization Q6H  . labetalol  400 mg Per Tube TID  . mouth rinse  15 mL Mouth Rinse 10 times per day  . mupirocin cream   Topical Daily  . neomycin-bacitracin-polymyxin  1 application Topical Daily  . pantoprazole sodium  40 mg Per Tube QHS  . sodium chloride flush  10-40 mL Intracatheter Q12H  . thiamine  100 mg Per Tube Daily  . topiramate  200 mg Per Tube BID     LOS: 19 days   Cherene Altes, MD [facility] Office  [phone] Pager - Text Page per Amion as per below:  On-Call/Text Page:      [email or web address]      password [email or web address]  If 7PM-7AM, please contact night-coverage [email or web address] Password [facility] 08/21/2017, 8:39  AM

## 2017-08-21 NOTE — Progress Notes (Signed)
MEDICATION RELATED CONSULT NOTE - Follow-up  Pharmacy Consult for phenytoin Indication: Seizures  No Known Allergies  Patient Measurements: Height: 5\' 5"  (165.1 cm) Weight: 205 lb 0.4 oz (93 kg) IBW/kg (Calculated) : 57  Vital Signs: Temp: 100.1 F (37.8 C) (02/12 0133) Temp Source: Oral (02/12 0133) BP: 162/73 (02/12 0900) Pulse Rate: 107 (02/12 0900) Intake/Output from previous day: 02/11 0701 - 02/12 0700 In: 1170 [I.V.:90; NG/GT:600] Out: 1500 [Urine:1500] Intake/Output from this shift: Total I/O In: 20 [I.V.:20] Out: 425 [Urine:225; Stool:200]  Labs: Recent Labs    08/19/17 0450 08/20/17 0500  WBC 17.5* 14.4*  HGB 9.9* 9.2*  HCT 31.1* 29.5*  PLT 535* 583*  CREATININE 0.98 1.01*  MG 1.7 2.1  PHOS 3.8 3.3   Estimated Creatinine Clearance: 66.8 mL/min (A) (by C-G formula based on SCr of 1.01 mg/dL (H)).  Assessment: Patient with seizures and has had multiple dose adjustments of phenytoin to get therapeutic lvls  Now trached  Previous lvl yesterday of 11.8 likely corrects to therapeutic  Plan:  Convert to pheyntoin susp 250 mg q6h Check lvls thu am (steady state)  Levester Fresh, PharmD, BCPS, BCCCP Clinical Pharmacist Clinical phone for 08/21/2017 from 7a-3:30p: [phone] If after 3:30p, please call main pharmacy at: [phone] 08/21/2017 9:48 AM

## 2017-08-22 DIAGNOSIS — J151 Pneumonia due to Pseudomonas: Secondary | ICD-10-CM

## 2017-08-22 DIAGNOSIS — R400 Somnolence: Secondary | ICD-10-CM

## 2017-08-22 LAB — CBC
HCT: 28.7 % — ABNORMAL LOW (ref 36.0–46.0)
HEMOGLOBIN: 8.9 g/dL — AB (ref 12.0–15.0)
MCH: 30.5 pg (ref 26.0–34.0)
MCHC: 31 g/dL (ref 30.0–36.0)
MCV: 98.3 fL (ref 78.0–100.0)
Platelets: 536 10*3/uL — ABNORMAL HIGH (ref 150–400)
RBC: 2.92 MIL/uL — AB (ref 3.87–5.11)
RDW: 16.2 % — ABNORMAL HIGH (ref 11.5–15.5)
WBC: 12.5 10*3/uL — AB (ref 4.0–10.5)

## 2017-08-22 LAB — BASIC METABOLIC PANEL
ANION GAP: 9 (ref 5–15)
BUN: 28 mg/dL — ABNORMAL HIGH (ref 6–20)
CHLORIDE: 120 mmol/L — AB (ref 101–111)
CO2: 17 mmol/L — ABNORMAL LOW (ref 22–32)
Calcium: 8.3 mg/dL — ABNORMAL LOW (ref 8.9–10.3)
Creatinine, Ser: 1.04 mg/dL — ABNORMAL HIGH (ref 0.44–1.00)
GFR calc non Af Amer: 57 mL/min — ABNORMAL LOW (ref >60)
Glucose, Bld: 166 mg/dL — ABNORMAL HIGH (ref 65–99)
POTASSIUM: 3.4 mmol/L — AB (ref 3.5–5.1)
SODIUM: 146 mmol/L — AB (ref 135–145)

## 2017-08-22 LAB — GLUCOSE, CAPILLARY
GLUCOSE-CAPILLARY: 156 mg/dL — AB (ref 65–99)
GLUCOSE-CAPILLARY: 158 mg/dL — AB (ref 65–99)
Glucose-Capillary: 141 mg/dL — ABNORMAL HIGH (ref 65–99)
Glucose-Capillary: 155 mg/dL — ABNORMAL HIGH (ref 65–99)
Glucose-Capillary: 100 mg/dL — ABNORMAL HIGH (ref 65–99)
Glucose-Capillary: 187 mg/dL — ABNORMAL HIGH (ref 65–99)
Glucose-Capillary: 146 mg/dL — ABNORMAL HIGH (ref 65–99)

## 2017-08-22 MED ORDER — ORAL CARE MOUTH RINSE
15.0000 mL | Freq: Four times a day (QID) | OROMUCOSAL | Status: DC
  Administered 2017-08-22 – 2017-08-24 (×9): 15 mL via OROMUCOSAL

## 2017-08-22 MED ORDER — POTASSIUM CHLORIDE 20 MEQ/15ML (10%) PO SOLN
50.0000 meq | Freq: Every day | ORAL | Status: DC
  Administered 2017-08-22 – 2017-09-07 (×16): 50 meq
  Filled 2017-08-22 (×17): qty 45

## 2017-08-22 NOTE — Progress Notes (Signed)
CSW received call from [place] with [facility]. CSW was asked for an update on pt at this time. CSW informed Noreene Larsson that pt is still on vent with trach. CSW was asked to send over any notes from the past 72 hours. CSW has faxed over needed information at this time. CSW also provided [place] with pt's daughter number at this time so that she can presented options to pt's daughter. CSW remains available for support to pt and family at this time.      Virgie Dad Kla Bily, MSW, [facility] Clinical Social Worker [phone]

## 2017-08-22 NOTE — Progress Notes (Signed)
Palliative Medicine Team consult was received.   Attempted to call daughter.  No answer and no option for voicemail.  Will attempt to reach out again tomorrow.  If there are urgent needs or questions please call [phone]. Thank you for consulting out team to assist with this patients care.  Micheline Rough, MD [place] Team [phone]

## 2017-08-22 NOTE — Consult Note (Signed)
[facility] Nurse wound consult note Reason for Consult: trach pressure injury Wound type: MDPRI (medical device related pressure injury) related to trach Pressure Injury POA: Yes-coccyx/sacrum/ No Measurement:  Sacrum/coccyx 6cm x 1cm x 0.1cm  Trach: unable to measure, unable to move trach plate away from neck enough Wound bed: Sacrum/coccyx:pink, moist, yellow centrally aprox: 25% Trach: unable to visualize, however appears moist  Drainage (amount, consistency, odor) minimal at both site  Periwound: intact Dressing procedure/placement/frequency: Continue sacral foam, making sure folded like a taco into the gluteal cleft Unable to place any type of topical care to the neck wound at this time. Will follow up when the sutures can be removed.   [facility] Nurse team will follow along with you for weekly wound assessments.  Please notify me of any acute changes in the wounds or any new areas of concerns [place] MSN, RN,CWOCN, [place], [place]

## 2017-08-22 NOTE — Care Management Note (Addendum)
Case Management Note  Patient Details  Name: Johnathon Mittal MRN: [id number] Date of Birth: Mar 19, 1956  Subjective/Objective:     Pt admitted post being found down  - once she arrived to [facility] ED pt was found to have overt clinical seizure activity - pt remains ventilated             Action/Plan:  PTA from home with [facility] for [facility] - agency to follow back up with CM to inform of [place] modalities.  Toxicology screen positive for cocaine - CSW consulted   Expected Discharge Date:                  Expected Discharge Plan:  [place]  In-House Referral:     Discharge planning Services  CM Consult  Post Acute Care Choice:    Choice offered to:     DME Arranged:    DME Agency:     HH Arranged:    HH Agency:     Status of Service:     If discussed at [employer], dates discussed:    Additional Comments: 08/22/2017  Pt remains on ventilator s/p  trach.  Pt does not have LTACH benefits.  Pt has significant brain injury.  Discharge plan will be SNF - CSW following  08/10/17 CM informed that pt was discharged from [facility] due to noncompliance.  Pt remains on ventilator Maryclare Labrador, RN 08/22/2017, 3:17 PM

## 2017-08-22 NOTE — Progress Notes (Signed)
Nutrition Follow-up  DOCUMENTATION CODES:   Obesity unspecified  INTERVENTION:   Continue:  Vital High Protein at 40 ml/h (960 ml per day) via PEG  Pro-stat 30 ml BID via PEG  Provides 1160 kcal, 114 gm protein, 803 ml free water daily; total 3.2 L free water daily with current free water flushes  NUTRITION DIAGNOSIS:   Inadequate oral intake related to inability to eat as evidenced by NPO status.  Ongoing  GOAL:   Provide needs based on ASPEN/SCCM guidelines  Met with TF  MONITOR:   Vent status, TF tolerance, Labs, I & O's  ASSESSMENT:   62 yo female with PMH of DM, HTN, alcohol and cocaine abuse, L thalamic hemorrhage, insomnia disorder who was admitted on 1/24 after being found down; clinical seizures in the ED; required intubation in ED.  Discussed patient with RN today.  S/P trach placement 2/6 & PEG placement 2/8. Patient is currently receiving Vital High Protein via PEG at 40 ml/h (960 ml/day) with Prostat 30 ml BID to provide 1160 kcals, 114 gm protein, 803 ml free water daily. Tolerating TF well. Patient remains intubated on ventilator support MV: 12.3 L/min Temp (24hrs), Avg:99.5 F (37.5 C), Min:98.9 F (37.2 C), Max:100 F (37.8 C)   Labs reviewed. Sodium 146 (H), potassium 3.4 (L) CBG's: 155-158 Medications reviewed and include Colace, KCl, thiamine. Receiving 400 ml free water every 4 hours via PEG.   Diet Order:  Seizure precautions Diet NPO time specified  EDUCATION NEEDS:   No education needs have been identified at this time  Skin:  Skin Assessment: Skin Integrity Issues: Skin Integrity Issues:: Unstageable Stage II: coccyx Unstageable: neck Diabetic Ulcer: R leg  Last BM:  2/13 (rectal tube)  Height:   Ht Readings from Last 1 Encounters:  08/02/17 _0  (1.651 m)    Weight:   Wt Readings from Last 1 Encounters:  08/22/17 201 lb 11.5 oz (91.5 kg)    Ideal Body Weight:  56.8 kg  BMI:  Body mass index is 33.57  kg/m.  Estimated Nutritional Needs:   Kcal:  [id number]  Protein:  114 gm  Fluid:  >1.5 L   Molli Barrows, RD, LDN, [place] Pager [phone] After Hours Pager [phone]

## 2017-08-22 NOTE — Progress Notes (Signed)
PROGRESS NOTE    [name]  KDT:[id number] DOB: 24-Nov-1955 DOA: 08/02/2017 PCP: Nolene Ebbs, MD   Brief Narrative:  62 year old BF PMHx polysubstance abuse (EtOH, Cocaine)Diabetes mellitus, HTN, Insomnia disorder, Shortness of breath.   Previously admitted to this hospital for a left thalamic hemorrhage.  She was found down today in a puddle of stool and urine.  She brought to our department emergency medicine where she had overt clinical seizure activity.  She was treated with Keppra fosphenytoin and Ativan in the clinical seizures have subsided.  EEG does not show ongoing seizure activity but shows decreased activity throughout the left hemisphere suggesting a postictal state.  She is unable to give any history and remains obtunded at this time.  Imaging studies did not show a new structural lesion.  She was coughing up purulent material in the department of emergency medicine.      Subjective: 2/13 eyes open: Obtunded   Assessment & Plan:   Active Problems:   Uncontrolled hypertension   Cerebrovascular accident (CVA) ([place])   Status epilepticus ([place])   Altered mental status   Cocaine abuse ([place])   Pressure injury of skin   Acute respiratory failure with hypoxemia (H[facility])   Metabolic acidosis   Tracheostomy status (HCC)   Polysubstance abuse (EtOH, Cocaine)   CVA/Watershed infarct/Acute encephalopath -Patient now obtunded secondary to polysubstance abuse, noncompliance---> refractory seizures and CVA. -Extremely poor prognosis, secondary to significant brain injury.  Seizure -Per neurology no attempts to wean AEDs for at least 2 months  Positive pseudomonas pneumonia -Completed course of treatment  Acute respiratory failure with hypoxemia -Vent dependent -Daily WUA/SBT   Chronic diastolic CHF -Amlodipine 10 mg daily -Enalapril 20 mg daily -Hydralazine 50 mg TID  Essential HTN  -see CHF    Hypernatremia  -Free water 400 ml q 4 hr     Hypokalemia -Potassium goal> 4 -Potassium 50 mEq  Hypomagnesemia -Magnesium goal> 2  Anemia,       Goals of care  -CSW currently working on placement after discussing with daughter options. Family would like patient to be placed close to family in the [facility] area.   -2/13 PALLIATIVE CARE: Consult placed polysubstance abuse (EtOH, Cocaine), hemorrhagic stroke, severe brain injury, continues to require full vent support. Obtunded CODE STATUS change, hospice   DVT prophylaxis: SCD Code Status: Full Family Communication: None Disposition Plan: TBD   Consultants:  Neurology [facility] M    Procedures/Significant Events:  EEG 1/24>>>This is an abnormal EEG.  There is evidence of moderate generalized slowing of brain activity as well as severe focal slowing in the left temporal lobe.  The patient is not in non-convulsive status epilepticus. 2D echo 1/25>>> EF 99-83%, grade 2 diastolic dysfunction  MRI brain 1/25>>> 1. No acute intracranial abnormality. 2. 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 3. Remote hemorrhage in the left thalamus and left pons. 4. Age advanced atrophy. 5. Fluid in the nasopharynx and paranasal sinuses is likely secondary to intubation.    I have personally reviewed and interpreted all radiology studies and my findings are as above.  VENTILATOR SETTINGS: PRVC Vt set: 566m Set rate: 24 FiO2: 30% PEEP; 5 cm H2O    Cultures 1/30 Blood Cx negative 1/30 Respiratory positive Psuedomonas Aeruginosa    Antimicrobials: Anti-infectives (From admission, onward)   Start     Stop   08/17/17 1145  ceFAZolin (ANCEF) IVPB 1 g/50 mL premix     08/17/17 1144   08/17/17 1120  ceFAZolin (ANCEF) powder  1 g  Status:  Discontinued     08/17/17 1141   08/17/17 1119  ceFAZolin (ANCEF) IVPB 2g/100 mL premix  Status:  Discontinued     08/17/17 1132   08/17/17 1057  ceFAZolin (ANCEF) 2-4 GM/100ML-% IVPB    Comments:  Camelia Phenes   : cabinet  override   08/17/17 1131   08/17/17 0600  ceFAZolin (ANCEF) IVPB 2g/100 mL premix     08/17/17 0553   08/16/17 1530  ceFAZolin (ANCEF) IVPB 2g/100 mL premix  Status:  Discontinued     08/16/17 1526   08/08/17 2300  vancomycin (VANCOCIN) IVPB 750 mg/150 ml premix  Status:  Discontinued     08/10/17 0923   08/08/17 1400  piperacillin-tazobactam (ZOSYN) IVPB 3.375 g  Status:  Discontinued     08/14/17 0745   08/08/17 1100  vancomycin (VANCOCIN) 2,000 mg in sodium chloride 0.9 % 500 mL IVPB     08/08/17 1253       Devices    LINES / TUBES:  CVC 1/24>>>2/6 ETT 1/24>>>2/6 NG/OG 1/24>>> Tracheostomy 2/6>> PICC 2/6 >>     Continuous Infusions: . sodium chloride 10 mL/hr at 08/22/17 0545     Objective: Vitals:   08/22/17 0500 08/22/17 0600 08/22/17 0700 08/22/17 0737  BP: (!) 172/87 (!) 141/64 138/66   Pulse: 85 80 79   Resp: (!) 24 (!) 24 (!) 24   Temp:    99.6 F (37.6 C)  TempSrc:    Oral  SpO2: 100% 100% 100%   Weight:      Height:        Intake/Output Summary (Last 24 hours) at 08/22/2017 0759 Last data filed at 08/22/2017 0700 Gross per 24 hour  Intake 420 ml  Output 2180 ml  Net -1760 ml   Filed Weights   08/19/17 0454 08/20/17 0500 08/22/17 0351  Weight: 194 lb 14.2 oz (88.4 kg) 205 lb 0.4 oz (93 kg) 201 lb 11.5 oz (91.5 kg)    Physical Exam:  General: Obtunded, acute respiratory distress Eyes: eyes open spontaneously positive anisocoria, Neck:  Negative scars, masses, torticollis, lymphadenopathy, JVD, #6 cuffed trach in place are clean negative sign of infection Lungs: Clear to auscultation bilaterally without wheezes or crackles Cardiovascular: Regular rate and rhythm without murmur gallop or rub normal S1 and S2 Abdomen: negative abdominal pain, nondistended, positive soft, bowel sounds, no rebound, no ascites, no appreciable mass, PEG tube in place negative sign of infection Extremities: No significant cyanosis, clubbing, or edema bilateral lower  extremities Skin: Negative rashes, lesions, ulcers Psychiatric:  Unable to assess secondary to CVA Central nervous system:  Unable to assess secondary CVA   .     Data Reviewed: Care during the described time interval was provided by me .  I have reviewed this patient's available data, including medical history, events of note, physical examination, and all test results as part of my evaluation.   CBC: Recent Labs  Lab 08/16/17 0829 08/19/17 0450 08/20/17 0500 08/22/17 0349  WBC 14.1* 17.5* 14.4* 12.5*  HGB 9.9* 9.9* 9.2* 8.9*  HCT 30.3* 31.1* 29.5* 28.7*  MCV 95.0 96.6 96.7 98.3  PLT 359 535* 583* 983*   Basic Metabolic Panel: Recent Labs  Lab 08/18/17 0439 08/19/17 0450 08/20/17 0500 08/21/17 0930 08/22/17 0349  NA 146* 147* 150* 149* 146*  K 2.8* 3.2* 3.4* 3.7 3.4*  CL 118* 119* 122* 124* 120*  CO2 16* 16* 16* 16* 17*  GLUCOSE 95 131* 146* 233* 166*  BUN 24* 21* 17 25* 28*  CREATININE 1.00 0.98 1.01* 1.19* 1.04*  CALCIUM 8.2* 8.3* 8.4* 8.2* 8.3*  MG  --  1.7 2.1  --   --   PHOS  --  3.8 3.3  --   --    GFR: Estimated Creatinine Clearance: 64.3 mL/min (A) (by C-G formula based on SCr of 1.04 mg/dL (H)). Liver Function Tests: Recent Labs  Lab 08/16/17 0829 08/21/17 0930  AST  --  51*  ALT  --  29  ALKPHOS  --  61  BILITOT  --  0.3  PROT  --  6.3*  ALBUMIN 1.4* 1.3*   No results for input(s): LIPASE, AMYLASE in the last 168 hours. No results for input(s): AMMONIA in the last 168 hours. Coagulation Profile: Recent Labs  Lab 08/17/17 0948  INR 1.64   Cardiac Enzymes: No results for input(s): CKTOTAL, CKMB, CKMBINDEX, TROPONINI in the last 168 hours. BNP (last 3 results) No results for input(s): PROBNP in the last 8760 hours. HbA1C: No results for input(s): HGBA1C in the last 72 hours. CBG: Recent Labs  Lab 08/21/17 0729 08/21/17 1132 08/21/17 1535 08/21/17 2346 08/22/17 0336  GLUCAP 188* 208* 159* 173* 155*   Lipid Profile: No results  for input(s): CHOL, HDL, LDLCALC, TRIG, CHOLHDL, LDLDIRECT in the last 72 hours. Thyroid Function Tests: No results for input(s): TSH, T4TOTAL, FREET4, T3FREE, THYROIDAB in the last 72 hours. Anemia Panel: No results for input(s): VITAMINB12, FOLATE, FERRITIN, TIBC, IRON, RETICCTPCT in the last 72 hours. Urine analysis:    Component Value Date/Time   COLORURINE YELLOW 08/05/2017 1216   APPEARANCEUR HAZY (A) 08/05/2017 1216   LABSPEC 1.017 08/05/2017 1216   PHURINE 5.0 08/05/2017 1216   GLUCOSEU NEGATIVE 08/05/2017 1216   GLUCOSEU > 1000 mg/dL (A) 11/01/2007 2056   HGBUR NEGATIVE 08/05/2017 1216   HGBUR negative 07/15/2007 0954   BILIRUBINUR NEGATIVE 08/05/2017 1216   KETONESUR NEGATIVE 08/05/2017 1216   PROTEINUR 30 (A) 08/05/2017 1216   UROBILINOGEN 1.0 04/09/2012 1524   NITRITE NEGATIVE 08/05/2017 1216   LEUKOCYTESUR TRACE (A) 08/05/2017 1216   Sepsis Labs: '@LABRCNTIP'$ (procalcitonin:4,lacticidven:4)  )No results found for this or any previous visit (from the past 240 hour(s)).       Radiology Studies: No results found.      Scheduled Meds: . amLODipine  10 mg Per Tube Daily  . aspirin  81 mg Per Tube Daily  . bisacodyl  10 mg Rectal Daily  . chlorhexidine gluconate (MEDLINE KIT)  15 mL Mouth Rinse BID  . Chlorhexidine Gluconate Cloth  6 each Topical Daily  . docusate  50 mg Per Tube Daily  . enalapril  20 mg Per Tube Daily  . feeding supplement (PRO-STAT SUGAR FREE 64)  30 mL Per Tube BID  . feeding supplement (VITAL HIGH PROTEIN)  1,000 mL Per Tube Q24H  . free water  400 mL Per Tube Q4H  . heparin  5,000 Units Subcutaneous Q8H  . hydrALAZINE  50 mg Per Tube Q8H  . insulin aspart  0-20 Units Subcutaneous Q4H  . insulin glargine  24 Units Subcutaneous BID  . labetalol  400 mg Per Tube TID  . lacosamide  200 mg Oral BID  . levETIRAcetam  1,000 mg Per Tube BID  . mouth rinse  15 mL Mouth Rinse 10 times per day  . mupirocin cream   Topical Daily  .  neomycin-bacitracin-polymyxin  1 application Topical Daily  . pantoprazole sodium  40 mg Per Tube  QHS  . phenytoin  250 mg Per Tube Q6H  . sodium chloride flush  10-40 mL Intracatheter Q12H  . thiamine  100 mg Per Tube Daily  . topiramate  200 mg Per Tube BID   Continuous Infusions: . sodium chloride 10 mL/hr at 08/22/17 0545     LOS: 20 days    Time spent: 40 minutes    Dai Apel, Geraldo Docker, MD [facility] Pager [phone]   If 7PM-7AM, please contact night-coverage [email or web address] Password [facility] 08/22/2017, 7:59 AM

## 2017-08-23 LAB — BASIC METABOLIC PANEL
Anion gap: 10 (ref 5–15)
BUN: 27 mg/dL — ABNORMAL HIGH (ref 6–20)
CALCIUM: 8.2 mg/dL — AB (ref 8.9–10.3)
CO2: 17 mmol/L — AB (ref 22–32)
CREATININE: 0.99 mg/dL (ref 0.44–1.00)
Chloride: 114 mmol/L — ABNORMAL HIGH (ref 101–111)
Glucose, Bld: 203 mg/dL — ABNORMAL HIGH (ref 65–99)
Potassium: 3.3 mmol/L — ABNORMAL LOW (ref 3.5–5.1)
SODIUM: 141 mmol/L (ref 135–145)

## 2017-08-23 LAB — CBC
HCT: 28.8 % — ABNORMAL LOW (ref 36.0–46.0)
Hemoglobin: 8.9 g/dL — ABNORMAL LOW (ref 12.0–15.0)
MCH: 30 pg (ref 26.0–34.0)
MCHC: 30.9 g/dL (ref 30.0–36.0)
MCV: 97 fL (ref 78.0–100.0)
PLATELETS: 520 10*3/uL — AB (ref 150–400)
RBC: 2.97 MIL/uL — ABNORMAL LOW (ref 3.87–5.11)
RDW: 15.8 % — AB (ref 11.5–15.5)
WBC: 10.9 10*3/uL — AB (ref 4.0–10.5)

## 2017-08-23 LAB — GLUCOSE, CAPILLARY
GLUCOSE-CAPILLARY: 191 mg/dL — AB (ref 65–99)
GLUCOSE-CAPILLARY: 269 mg/dL — AB (ref 65–99)
Glucose-Capillary: 171 mg/dL — ABNORMAL HIGH (ref 65–99)
Glucose-Capillary: 168 mg/dL — ABNORMAL HIGH (ref 65–99)
Glucose-Capillary: 140 mg/dL — ABNORMAL HIGH (ref 65–99)

## 2017-08-23 LAB — PHENYTOIN LEVEL, TOTAL: PHENYTOIN LVL: 7.9 ug/mL — AB (ref 10.0–20.0)

## 2017-08-23 LAB — MAGNESIUM: MAGNESIUM: 1.6 mg/dL — AB (ref 1.7–2.4)

## 2017-08-23 LAB — ALBUMIN: ALBUMIN: 1.3 g/dL — AB (ref 3.5–5.0)

## 2017-08-23 MED ORDER — MAGNESIUM SULFATE 4 GM/100ML IV SOLN
4.0000 g | Freq: Once | INTRAVENOUS | Status: AC
  Administered 2017-08-23: 4 g via INTRAVENOUS
  Filled 2017-08-23: qty 100

## 2017-08-23 NOTE — Progress Notes (Signed)
CSW spoke with [name] and was informed that she will be reaching out ot pt's daughter today to follow up with paperwork for pt. CSW has spoken with [name]( pt's daughter) and explained to her the barrier to placing pt in [place] at this time. CSW explained that pt has Medicaid of [place] and Medicaid with States vary. CSW informed daughter that pt may need Medicaid in [place] for placement, as [place] Medicaid may not pay for placement in [place] and pt would be billed for placement. Daughter expressed verbal understanding and plans to speak with [name] from [place] to began paperwork/. [name] to call CSW after to follow up with needs.     [name], MSW, [facility] Clinical Social Worker [phone]

## 2017-08-23 NOTE — Progress Notes (Signed)
PROGRESS NOTE    [name]  GGE:[id number] DOB: Jul 12, 1955 DOA: 08/02/2017 PCP: Nolene Ebbs, MD   Brief Narrative:  62 year old BF PMHx polysubstance abuse (EtOH, Cocaine)Diabetes mellitus, HTN, Insomnia disorder, Shortness of breath.   Previously admitted to this hospital for a left thalamic hemorrhage.  She was found down today in a puddle of stool and urine.  She brought to our department emergency medicine where she had overt clinical seizure activity.  She was treated with Keppra fosphenytoin and Ativan in the clinical seizures have subsided.  EEG does not show ongoing seizure activity but shows decreased activity throughout the left hemisphere suggesting a postictal state.  She is unable to give any history and remains obtunded at this time.  Imaging studies did not show a new structural lesion.  She was coughing up purulent material in the department of emergency medicine.      Subjective: 2/14 eyes open, obtunded   Assessment & Plan:   Active Problems:   Uncontrolled hypertension   Cerebrovascular accident (CVA) ([place])   Status epilepticus ([place])   Altered mental status   Cocaine abuse ([place])   Pressure injury of skin   Acute respiratory failure with hypoxemia (HCC)   Metabolic acidosis   Tracheostomy status (HCC)   Polysubstance abuse (EtOH, Cocaine)   CVA/Watershed infarct/Acute encephalopath -Patient now obtunded secondary to polysubstance abuse, noncompliance---> refractory seizures and CVA. -Extremely poor prognosis, secondary to significant brain injury.  Seizure -Per neurology no attempts to wean AEDs for at least 2 months  Positive pseudomonas pneumonia -Completed course of treatment  Acute respiratory failure with hypoxemia -Vent dependent -Daily WUA/SBT   Chronic diastolic CHF -Amlodipine 10 mg daily -Enalapril 20 mg daily -Hydralazine 50 mg TID  Essential HTN  -see CHF    Hypernatremia  -Free water 400 ml q 4 hr     Hypokalemia -Potassium goal> 4 -Potassium 50 mEq  Hypomagnesemia -Magnesium goal> 2  Anemia,       Goals of care  -CSW currently working on placement after discussing with daughter options. Family would like patient to be placed close to family in the [facility] area.   -2/13 PALLIATIVE CARE: Consult placed polysubstance abuse (EtOH, Cocaine), hemorrhagic stroke, severe brain injury, continues to require full vent support. Obtunded CODE STATUS change, hospice -2/14 palliative care attempted to contact family unsuccessfully. Given patient only has Medicaid of [place] very poor likelihood that we will be able to transfer patient to [place]. Spoke with Wynetta Emery from [place] will not except patient in LTAC or vent SNF.    DVT prophylaxis: SCD Code Status: Full Family Communication: None Disposition Plan: TBD   Consultants:  Neurology [facility] M    Procedures/Significant Events:  EEG 1/24>>>This is an abnormal EEG.  There is evidence of moderate generalized slowing of brain activity as well as severe focal slowing in the left temporal lobe.  The patient is not in non-convulsive status epilepticus. 2D echo 1/25>>> EF 46-50%, grade 2 diastolic dysfunction  MRI brain 1/25>>> 1. No acute intracranial abnormality. 2. 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 3. Remote hemorrhage in the left thalamus and left pons. 4. Age advanced atrophy. 5. Fluid in the nasopharynx and paranasal sinuses is likely secondary to intubation.    I have personally reviewed and interpreted all radiology studies and my findings are as above.  VENTILATOR SETTINGS: PRVC Vt set: 556m Set rate: 24 FiO2: 30% PEEP; 5 cm H2O    Cultures 1/30 Blood Cx negative 1/30 Respiratory positive  Psuedomonas Aeruginosa    Antimicrobials: Anti-infectives (From admission, onward)   Start     Stop   08/17/17 1145  ceFAZolin (ANCEF) IVPB 1 g/50 mL premix     08/17/17 1144   08/17/17 1120   ceFAZolin (ANCEF) powder 1 g  Status:  Discontinued     08/17/17 1141   08/17/17 1119  ceFAZolin (ANCEF) IVPB 2g/100 mL premix  Status:  Discontinued     08/17/17 1132   08/17/17 1057  ceFAZolin (ANCEF) 2-4 GM/100ML-% IVPB    Comments:  Camelia Phenes   : cabinet override   08/17/17 1131   08/17/17 0600  ceFAZolin (ANCEF) IVPB 2g/100 mL premix     08/17/17 0553   08/16/17 1530  ceFAZolin (ANCEF) IVPB 2g/100 mL premix  Status:  Discontinued     08/16/17 1526   08/08/17 2300  vancomycin (VANCOCIN) IVPB 750 mg/150 ml premix  Status:  Discontinued     08/10/17 0923   08/08/17 1400  piperacillin-tazobactam (ZOSYN) IVPB 3.375 g  Status:  Discontinued     08/14/17 0745   08/08/17 1100  vancomycin (VANCOCIN) 2,000 mg in sodium chloride 0.9 % 500 mL IVPB     08/08/17 1253       Devices    LINES / TUBES:  CVC 1/24>>>2/6 ETT 1/24>>>2/6 NG/OG 1/24>>> Tracheostomy 2/6>> PICC 2/6 >>     Continuous Infusions: . sodium chloride 10 mL/hr at 08/22/17 0800     Objective: Vitals:   08/23/17 1600 08/23/17 1700 08/23/17 1800 08/23/17 1923  BP: 133/67 130/66 138/68   Pulse: 79 79 80   Resp: (!) 24 (!) 24 (!) 24   Temp:      TempSrc:      SpO2: 100% 100% 99% 100%  Weight:      Height:        Intake/Output Summary (Last 24 hours) at 08/23/2017 1952 Last data filed at 08/23/2017 1800 Gross per 24 hour  Intake 3025 ml  Output 2105 ml  Net 920 ml   Filed Weights   08/20/17 0500 08/22/17 0351 08/23/17 0500  Weight: 205 lb 0.4 oz (93 kg) 201 lb 11.5 oz (91.5 kg) 202 lb 6.1 oz (91.8 kg)    Physical Exam:  General: Obtunded, acute respiratory distress Eyes: eyes open spontaneously positive anisocoria, Neck:  Negative scars, masses, torticollis, lymphadenopathy, JVD, #6 cuffed trach in place are clean negative sign of infection Lungs: Clear to auscultation bilaterally without wheezes or crackles Cardiovascular: Regular rate and rhythm without murmur gallop or rub normal S1 and  S2 Abdomen: negative abdominal pain, nondistended, positive soft, bowel sounds, no rebound, no ascites, no appreciable mass, PEG tube in place negative sign of infection Extremities: No significant cyanosis, clubbing, or edema bilateral lower extremities Skin: Negative rashes, lesions, ulcers Psychiatric:  Unable to assess secondary to CVA Central nervous system:  Unable to assess secondary CVA   .     Data Reviewed: Care during the described time interval was provided by me .  I have reviewed this patient's available data, including medical history, events of note, physical examination, and all test results as part of my evaluation.   CBC: Recent Labs  Lab 08/19/17 0450 08/20/17 0500 08/22/17 0349 08/23/17 0410  WBC 17.5* 14.4* 12.5* 10.9*  HGB 9.9* 9.2* 8.9* 8.9*  HCT 31.1* 29.5* 28.7* 28.8*  MCV 96.6 96.7 98.3 97.0  PLT 535* 583* 536* 794*   Basic Metabolic Panel: Recent Labs  Lab 08/19/17 0450 08/20/17 0500 08/21/17 0930 08/22/17  0349 08/23/17 0410  NA 147* 150* 149* 146* 141  K 3.2* 3.4* 3.7 3.4* 3.3*  CL 119* 122* 124* 120* 114*  CO2 16* 16* 16* 17* 17*  GLUCOSE 131* 146* 233* 166* 203*  BUN 21* 17 25* 28* 27*  CREATININE 0.98 1.01* 1.19* 1.04* 0.99  CALCIUM 8.3* 8.4* 8.2* 8.3* 8.2*  MG 1.7 2.1  --   --  1.6*  PHOS 3.8 3.3  --   --   --    GFR: Estimated Creatinine Clearance: 67.6 mL/min (by C-G formula based on SCr of 0.99 mg/dL). Liver Function Tests: Recent Labs  Lab 08/21/17 0930 08/23/17 0410  AST 51*  --   ALT 29  --   ALKPHOS 61  --   BILITOT 0.3  --   PROT 6.3*  --   ALBUMIN 1.3* 1.3*   No results for input(s): LIPASE, AMYLASE in the last 168 hours. No results for input(s): AMMONIA in the last 168 hours. Coagulation Profile: Recent Labs  Lab 08/17/17 0948  INR 1.64   Cardiac Enzymes: No results for input(s): CKTOTAL, CKMB, CKMBINDEX, TROPONINI in the last 168 hours. BNP (last 3 results) No results for input(s): PROBNP in the last  8760 hours. HbA1C: No results for input(s): HGBA1C in the last 72 hours. CBG: Recent Labs  Lab 08/22/17 2330 08/23/17 0351 08/23/17 0729 08/23/17 1140 08/23/17 1515  GLUCAP 156* 171* 168* 191* 269*   Lipid Profile: No results for input(s): CHOL, HDL, LDLCALC, TRIG, CHOLHDL, LDLDIRECT in the last 72 hours. Thyroid Function Tests: No results for input(s): TSH, T4TOTAL, FREET4, T3FREE, THYROIDAB in the last 72 hours. Anemia Panel: No results for input(s): VITAMINB12, FOLATE, FERRITIN, TIBC, IRON, RETICCTPCT in the last 72 hours. Urine analysis:    Component Value Date/Time   COLORURINE YELLOW 08/05/2017 1216   APPEARANCEUR HAZY (A) 08/05/2017 1216   LABSPEC 1.017 08/05/2017 1216   PHURINE 5.0 08/05/2017 1216   GLUCOSEU NEGATIVE 08/05/2017 1216   GLUCOSEU > 1000 mg/dL (A) 11/01/2007 2056   HGBUR NEGATIVE 08/05/2017 1216   HGBUR negative 07/15/2007 0954   BILIRUBINUR NEGATIVE 08/05/2017 1216   KETONESUR NEGATIVE 08/05/2017 1216   PROTEINUR 30 (A) 08/05/2017 1216   UROBILINOGEN 1.0 04/09/2012 1524   NITRITE NEGATIVE 08/05/2017 1216   LEUKOCYTESUR TRACE (A) 08/05/2017 1216   Sepsis Labs: _0 (procalcitonin:4,lacticidven:4)  )No results found for this or any previous visit (from the past 240 hour(s)).       Radiology Studies: No results found.      Scheduled Meds: . amLODipine  10 mg Per Tube Daily  . aspirin  81 mg Per Tube Daily  . bisacodyl  10 mg Rectal Daily  . chlorhexidine gluconate (MEDLINE KIT)  15 mL Mouth Rinse BID  . Chlorhexidine Gluconate Cloth  6 each Topical Daily  . docusate  50 mg Per Tube Daily  . enalapril  20 mg Per Tube Daily  . feeding supplement (PRO-STAT SUGAR FREE 64)  30 mL Per Tube BID  . feeding supplement (VITAL HIGH PROTEIN)  1,000 mL Per Tube Q24H  . free water  400 mL Per Tube Q4H  . heparin  5,000 Units Subcutaneous Q8H  . hydrALAZINE  50 mg Per Tube Q8H  . insulin aspart  0-20 Units Subcutaneous Q4H  . insulin  glargine  24 Units Subcutaneous BID  . labetalol  400 mg Per Tube TID  . lacosamide  200 mg Oral BID  . levETIRAcetam  1,000 mg Per Tube BID  . mouth  rinse  15 mL Mouth Rinse QID  . mupirocin cream   Topical Daily  . neomycin-bacitracin-polymyxin  1 application Topical Daily  . pantoprazole sodium  40 mg Per Tube QHS  . phenytoin  250 mg Per Tube Q6H  . potassium chloride  50 mEq Per Tube Daily  . sodium chloride flush  10-40 mL Intracatheter Q12H  . thiamine  100 mg Per Tube Daily  . topiramate  200 mg Per Tube BID   Continuous Infusions: . sodium chloride 10 mL/hr at 08/22/17 0800     LOS: 21 days    Time spent: 40 minutes    Ludmila Ebarb, Geraldo Docker, MD [facility] Pager [phone]   If 7PM-7AM, please contact night-coverage [email or web address] Password [facility] 08/23/2017, 7:52 PM

## 2017-08-23 NOTE — Progress Notes (Signed)
Attempted to call daughter again this evening.  Phone went directly to voicemail (yesterday phone made clicking noise and not option to leave VM).  I left message requesting return call.  Micheline Rough, MD [place] Team [phone]

## 2017-08-23 NOTE — Progress Notes (Signed)
Sutures at trach site removed per MD order. Trach site cleaned and trach tie changed and secure.

## 2017-08-23 NOTE — Progress Notes (Signed)
CSW has also reached out to [facility] with [facility] to see if they received referral on pt for placement just in case [facility] is unable to meet pt's needs at the time of discharge. CSW has left VM at this time for [facility]. CSW still following for needs. No call from [facility] at this time.     Virgie Dad Luella Gardenhire, MSW, [facility] Clinical Social Worker [phone]

## 2017-08-23 NOTE — Progress Notes (Signed)
MEDICATION RELATED CONSULT NOTE - Follow-up  Pharmacy Consult for phenytoin Indication: Seizures  No Known Allergies  Patient Measurements: Height: 5\' 5"  (165.1 cm) Weight: 202 lb 6.1 oz (91.8 kg) IBW/kg (Calculated) : 57  Vital Signs: Temp: 99.3 F (37.4 C) (02/14 0831) Temp Source: Oral (02/14 0831) BP: 117/60 (02/14 0830) Pulse Rate: 83 (02/14 0830) Intake/Output from previous day: 02/13 0701 - 02/14 0700 In: 3210 [I.V.:220; NG/GT:2440; IV Piggyback:100] Out: 1625 [Urine:1625] Intake/Output from this shift: Total I/O In: 100 [I.V.:20; NG/GT:80] Out: -   Labs: Recent Labs    08/21/17 0930 08/22/17 0349 08/23/17 0410  WBC  --  12.5* 10.9*  HGB  --  8.9* 8.9*  HCT  --  28.7* 28.8*  PLT  --  536* 520*  CREATININE 1.19* 1.04* 0.99  MG  --   --  1.6*  ALBUMIN 1.3*  --  1.3*  PROT 6.3*  --   --   AST 51*  --   --   ALT 29  --   --   ALKPHOS 61  --   --   BILITOT 0.3  --   --    Estimated Creatinine Clearance: 67.6 mL/min (by C-G formula based on SCr of 0.99 mg/dL).  Assessment: Patient with seizures and has had multiple dose adjustments of phenytoin to get therapeutic lvls  Now trached  Phenytoin lvl this am 7.9, alb 1.3 - this corrects to 17.9 - which is therapeutic  Plan:  Continue pheyntoin susp 250 mg q6h Check lvls prn  Levester Fresh, PharmD, BCPS, BCCCP Clinical Pharmacist Clinical phone for 08/23/2017 from 7a-3:30p: [phone] If after 3:30p, please call main pharmacy at: [phone] 08/23/2017 9:03 AM

## 2017-08-23 NOTE — Progress Notes (Signed)
eLink Physician-Brief Progress Note Patient Name: Telesia Ates DOB: May 15, 1956 MRN: [id number]   Date of Service  08/23/2017  HPI/Events of Note  Hypokalemia and hypomag  eICU Interventions  Has standing potassium order Mag replaced     Intervention Category Intermediate Interventions: Electrolyte abnormality - evaluation and management  Darryel Diodato 08/23/2017, 6:08 AM

## 2017-08-24 ENCOUNTER — Inpatient Hospital Stay (HOSPITAL_COMMUNITY): Payer: Medicaid Other

## 2017-08-24 LAB — GLUCOSE, CAPILLARY
GLUCOSE-CAPILLARY: 155 mg/dL — AB (ref 65–99)
GLUCOSE-CAPILLARY: 136 mg/dL — AB (ref 65–99)
GLUCOSE-CAPILLARY: 157 mg/dL — AB (ref 65–99)
GLUCOSE-CAPILLARY: 188 mg/dL — AB (ref 65–99)
Glucose-Capillary: 167 mg/dL — ABNORMAL HIGH (ref 65–99)
Glucose-Capillary: 210 mg/dL — ABNORMAL HIGH (ref 65–99)

## 2017-08-24 LAB — CBC
HCT: 29.6 % — ABNORMAL LOW (ref 36.0–46.0)
Hemoglobin: 9.4 g/dL — ABNORMAL LOW (ref 12.0–15.0)
MCH: 30.5 pg (ref 26.0–34.0)
MCHC: 31.8 g/dL (ref 30.0–36.0)
MCV: 96.1 fL (ref 78.0–100.0)
PLATELETS: 535 10*3/uL — AB (ref 150–400)
RBC: 3.08 MIL/uL — ABNORMAL LOW (ref 3.87–5.11)
RDW: 15.6 % — ABNORMAL HIGH (ref 11.5–15.5)
WBC: 10.5 10*3/uL (ref 4.0–10.5)

## 2017-08-24 LAB — BASIC METABOLIC PANEL
Anion gap: 10 (ref 5–15)
BUN: 27 mg/dL — ABNORMAL HIGH (ref 6–20)
CALCIUM: 8.5 mg/dL — AB (ref 8.9–10.3)
CO2: 17 mmol/L — AB (ref 22–32)
CREATININE: 0.92 mg/dL (ref 0.44–1.00)
Chloride: 113 mmol/L — ABNORMAL HIGH (ref 101–111)
GFR calc non Af Amer: >60 mL/min (ref >60)
Glucose, Bld: 192 mg/dL — ABNORMAL HIGH (ref 65–99)
Potassium: 3.6 mmol/L (ref 3.5–5.1)
Sodium: 140 mmol/L (ref 135–145)

## 2017-08-24 LAB — MAGNESIUM: Magnesium: 1.9 mg/dL (ref 1.7–2.4)

## 2017-08-24 MED ORDER — FENTANYL CITRATE (PF) 100 MCG/2ML IJ SOLN: 50.0000 ug | INTRAMUSCULAR | Status: DC | PRN

## 2017-08-24 MED ORDER — ORAL CARE MOUTH RINSE
15.0000 mL | OROMUCOSAL | Status: DC
  Administered 2017-08-24 – 2017-09-03 (×90): 15 mL via OROMUCOSAL

## 2017-08-24 MED ORDER — FUROSEMIDE 20 MG PO TABS
20.0000 mg | ORAL_TABLET | Freq: Every day | ORAL | Status: DC
  Administered 2017-08-24 – 2017-08-25 (×2): 20 mg
  Filled 2017-08-24 (×2): qty 1

## 2017-08-24 MED ORDER — LORAZEPAM 2 MG/ML IJ SOLN
1.0000 mg | INTRAMUSCULAR | Status: DC | PRN
  Administered 2017-08-24 – 2017-08-28 (×8): 1 mg via INTRAVENOUS
  Filled 2017-08-24 (×8): qty 1

## 2017-08-24 MED ORDER — FREE WATER
300.0000 mL | Status: DC
  Administered 2017-08-24 – 2017-08-25 (×8): 300 mL

## 2017-08-24 MED ORDER — CHLORHEXIDINE GLUCONATE 0.12% ORAL RINSE (MEDLINE KIT)
15.0000 mL | Freq: Two times a day (BID) | OROMUCOSAL | Status: DC
  Administered 2017-08-25 – 2017-09-03 (×20): 15 mL via OROMUCOSAL

## 2017-08-24 MED ORDER — SODIUM BICARBONATE 650 MG PO TABS
650.0000 mg | ORAL_TABLET | Freq: Three times a day (TID) | ORAL | Status: DC
  Administered 2017-08-24 – 2017-08-26 (×8): 650 mg
  Filled 2017-08-24 (×11): qty 1

## 2017-08-24 MED ORDER — LACOSAMIDE 50 MG PO TABS
200.0000 mg | ORAL_TABLET | Freq: Two times a day (BID) | ORAL | Status: DC
  Administered 2017-08-24 – 2017-09-14 (×43): 200 mg
  Filled 2017-08-24 (×24): qty 4
  Filled 2017-08-24: qty 1
  Filled 2017-08-24 (×18): qty 4

## 2017-08-24 NOTE — Progress Notes (Addendum)
10:23am- CSW has reached out to pt's daughter once again-no answer. CSW has also searched for daughter address through internet search. CSW was only able to find one person matching pt's daughter, however person lives in another state than what daughter has given.  9:20am-CSW received call from [place] from [place]. CSW was informed that [name] has been reaching out to daughter and still no return call. CSW was informed that she left VM for daughter explaining that she is needing to speak with her in order to do paperwork for admission to the facility. [name] again confirmed that they are able to take pt-if and when daughter does paperwork. [name] expressed that she can place pt in the facility that would be closer to daughter [name]). CSW still following for needs.   7:40am- CSW reached out to pt's daughter [name] at [phone]. CSW did not get an answer, therefor CSW left VM asking that daughter call either CSW, [name] (individual  helping with placing pt at her facility, and/or MD/staff on the unit to discuss further needs for pt at this time. CSW provided daughter with unit number to ensure that she has contact information to get in touch with staff regarding pt care at this time.   CSW reached out to [place] from [facility] to follow up with her on speaking with pt's daughter about placement. CSW left [place] VM asking that she call CSW back with updates regarding placement at this facility. CSW aware that per notes, staff has been unable to reach pt's daughter. CSW to reach out to daughter again today to inform her of this/\. CSW remains available for support at this.     [name] [name], MSW, [facility] Clinical Social Worker [phone]

## 2017-08-24 NOTE — Consult Note (Signed)
[facility] Nurse wound follow up Wound type: Stage 2 Medical Device Related Pressure Injury Measurement:0.3cm x 1.0cm x 0.1cm  Wound bed: clean, pink, moist  Drainage (amount, consistency, odor) minimal, no odor Periwound: intact  Dressing procedure/placement/frequency: Added silicone foam after trach sutures were removed this week. Continue foam to protect, insulate, and prevent further injury. Change every 3 days and PRN soilage.   [facility] Nurse team will follow along with you for weekly wound assessments.  Please notify me of any acute changes in the wounds or any new areas of concerns [place] MSN, RN,CWOCN, [place], [place]

## 2017-08-24 NOTE — Progress Notes (Signed)
PULMONARY / CRITICAL CARE MEDICINE   Name: [name] MRN: [id number] DOB: 1956-03-29    ADMISSION DATE:  08/02/2017 CONSULTATION DATE:  08/02/2017  CHIEF COMPLAINT:  Found down  HISTORY OF PRESENT ILLNESS:   62 y/o female with a history of left thalamic hemorrhage previously who was admitted with seizure activity.     SUBJECTIVE:  Failed weaning this AM again.  No events overnight.   VITAL SIGNS: BP 119/67   Pulse 86   Temp 99.3 F (37.4 C) (Oral)   Resp (!) 31   Ht 5\' 5"  (1.651 m)   Wt 200 lb 13.4 oz (91.1 kg)   SpO2 100%   BMI 33.42 kg/m    HEMODYNAMICS:    VENTILATOR SETTINGS: Vent Mode: PRVC FiO2 (%):  [30 %] 30 % Set Rate:  [24 bmp] 24 bmp Vt Set:  [500 mL] 500 mL PEEP:  [5 cmH20] 5 cmH20 Plateau Pressure:  [19 cmH20-27 cmH20] 27 cmH20  INTAKE / OUTPUT: I/O last 3 completed shifts: In: 3645 [I.V.:380; Other:165; NG/GT:3000; IV Piggyback:100] Out: 3005 [Urine:3005]  PHYSICAL EXAMINATION:  General: 62 year old female currently tracheostomy and ventilator dependent HEENT: #6 cuffed [place] unremarkable Pulmonary: Scattered rhonchi, thick discolored tracheal secretions Cardiac: Regular rate and rhythm Extremities/musculoskeletal: Generalized anasarca, generalized weakness Abdomen: Diarrhea Neuro: Unresponsive  LABS:  BMET Recent Labs  Lab 08/22/17 0349 08/23/17 0410 08/24/17 0349  NA 146* 141 140  K 3.4* 3.3* 3.6  CL 120* 114* 113*  CO2 17* 17* 17*  BUN 28* 27* 27*  CREATININE 1.04* 0.99 0.92  GLUCOSE 166* 203* 192*   Electrolytes Recent Labs  Lab 08/19/17 0450 08/20/17 0500  08/22/17 0349 08/23/17 0410 08/24/17 0349  CALCIUM 8.3* 8.4*   < > 8.3* 8.2* 8.5*  MG 1.7 2.1  --   --  1.6* 1.9  PHOS 3.8 3.3  --   --   --   --    < > = values in this interval not displayed.   CBC Recent Labs  Lab 08/22/17 0349 08/23/17 0410 08/24/17 0349  WBC 12.5* 10.9* 10.5  HGB 8.9* 8.9* 9.4*  HCT 28.7* 28.8* 29.6*  PLT 536* 520* 535*    Coag's No results for input(s): APTT, INR in the last 168 hours. Sepsis Markers No results for input(s): LATICACIDVEN, PROCALCITON, O2SATVEN in the last 168 hours.  ABG No results for input(s): PHART, PCO2ART, PO2ART in the last 168 hours.  Liver Enzymes Recent Labs  Lab 08/21/17 0930 08/23/17 0410  AST 51*  --   ALT 29  --   ALKPHOS 61  --   BILITOT 0.3  --   ALBUMIN 1.3* 1.3*   Cardiac Enzymes No results for input(s): TROPONINI, PROBNP in the last 168 hours.  Glucose Recent Labs  Lab 08/23/17 1140 08/23/17 1515 08/23/17 2000 08/24/17 0040 08/24/17 0340 08/24/17 0733  GLUCAP 191* 269* 140* 210* 167* 155*   Imaging No results found. STUDIES:  EEG 1/24>>>This is an abnormal EEG.There is evidence of moderate generalized slowing of brain activity as well as severe focal slowing in the left temporal lobe. The patient is not in non-convulsive status epilepticus. 2D echo 1/25>>> EF 45-80%, grade 2 diastolic dysfunction  MRI brain 1/25>>> 1. No acute intracranial abnormality. 2. 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 3. Remote hemorrhage in the left thalamus and left pons. 4. Age advanced atrophy. 5. Fluid in the nasopharynx and paranasal sinuses is likely secondary to intubation.  CULTURES: Blood Cx 1/30>>> Respiratory 1/30>>>Psuedomonas  ANTIBIOTICS: Vancomycin 1/30>>> 1/31 Zosyn 1/30>>> 2/5  LINES/TUBES: CVC 1/24>>>2/6 ETT 1/24>>>2/6 NG/OG 1/24>>> Tracheostomy 2/6>> PICC 2/6 >>  I reviewed CXR myself, trach is in good position.  ASSESSMENT / PLAN:  Acute respiratory failure with hypoxemia-Improving Tracheostomy status HTN Anemia, no bleeding  Pseudomonas Pneumonia  Hyperglycemia  Non-anion gap metabolic acidosis suspect due to hyperchloremia Watershed infarct/ischemic encephalopathy Acute encephalopathy Status epilepticus   Pulmonary problem 62 year old female who is tracheostomy and ventilator dependent setting of  ischemic encephalopathy Course has been complicated by refractory status epilepticus, Pseudomonas pneumonia, and volume overload.  We have not been able to liberate her from the ventilator, she is not a candidate for decannulation. -Interval history 2/11-2/15: Doing some weaning on pressure support but limited due to her tachypnea, she is having increased tracheal secretions, but no significant fever spike -And white blood cell count has remained stable.  On exam from 2/15 she can tolerate pressure support of 12, however less than that developed some degree of accessory use.  Chest x-ray has been ordered by the primary team, will follow up on this.  It is not clear if she can be liberated from the ventilator or not although it would be reasonable to try daily aerosol trach collar trials  Plan/rec Follow-up chest x-ray Agree with IV diuresis Continue pressure support ventilation for now as tolerated If chest x-ray clear I think it would be reasonable to start daily aerosol trach collar trials, although I do not think she could be decannulated, if we could liberate her from ventilation her disposition long-term could be much improved and that she could be closer to family We will add bicarbonate replacement via tube, perhaps addressing metabolic acidosis may decrease her ventilatory drive some.  We will be available as needed over the weekend and see again on 2/18  Erick Colace ACNP-BC [place] Pager # [phone] OR # [phone] if no answer   08/24/2017, 10:15 AM

## 2017-08-24 NOTE — Progress Notes (Signed)
[facility] TEAM 1 - Stepdown/ICU TEAM  Wynn Banker  EHU:[id number] DOB: [birth year] DOA: 08/02/2017 PCP: Nolene Ebbs, MD    Brief Narrative:  62yo F w/ a Hx of polysubstance abuse (EtOH, Cocaine), DM, and HTN who had been previously admitted to this hospital for a left thalamic hemorrhage. She was found down the day of this admit in a puddle of stool and urine, and brought to the ED where she had overt clinical seizure activity.She was treated with Keppra fosphenytoin and Ativan resulting in arrest of the seizures.    Significant Events: 1/24 admit - intubated 1/25 TTE - EF 55-60% - grade 2 DD 2/6 tracheostomy  2/8 PEG tube in IR  2/11 TRH assumed care   Subjective: The pt is non-communicative.  She does not respond to my voice or my exam.    Assessment & Plan:  Seizure - refractory status epilepticus  Due to non-compliance w/ seizure meds, hyperosmolar state, and cocaine use - potentially seizing for days upon arrival (had not been seen for 2 days prior to being found) - Neuro suggests no attempts to wean AEDs for at least 2 months - signif injury to brain thought to have occurred - no clinical evidence of any improvement at this time   Pseudomonas pneumonia Completed course of treatment - afebrile   Acute hypoxic respiratory failure Vent dependent - s/p trach - vent per PCCM  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 in setting of HTN - refused SNF placement at that time  Chronic diastolic CHF Mildly volume overloaded on exam - resume diuretic and follow   Filed Weights   08/22/17 0351 08/23/17 0500 08/24/17 0500  Weight: 91.5 kg (201 lb 11.5 oz) 91.8 kg (202 lb 6.1 oz) 91.1 kg (200 lb 13.4 oz)    DM CBG reasonably controlled   HTN  BP variable w/ periods of signif elevation  - adjust tx and follow   Hypernatremia  Corrected w/ increase of per tube free water - follow trend   Hypokalemia Cont to supplement and follow to goal of 4.0  Normocytic  Anemia No evidence of acute blood loss - following trend   DVT prophylaxis: SCDs Code Status: FULL CODE Family Communication: no family present at time of exam  Disposition Plan: will need a vent capable SNF - placement proving to be difficult    Consultants:  Neurology PCCM  Antimicrobials:  Vancomycin 1/30 > 1/31 Zosyn 1/30 > 2/5  Objective: Blood pressure 119/67, pulse 86, temperature 99.3 F (37.4 C), temperature source Oral, resp. rate (!) 31, height '5\' 5"'$  (1.651 m), weight 91.1 kg (200 lb 13.4 oz), SpO2 100 %.  Intake/Output Summary (Last 24 hours) at 08/24/2017 0919 Last data filed at 08/24/2017 0800 Gross per 24 hour  Intake 1620 ml  Output 2155 ml  Net -535 ml   Filed Weights   08/22/17 0351 08/23/17 0500 08/24/17 0500  Weight: 91.5 kg (201 lb 11.5 oz) 91.8 kg (202 lb 6.1 oz) 91.1 kg (200 lb 13.4 oz)    Examination: General: No acute respiratory distress - unresponsive  Lungs: CTA B  Cardiovascular: RRR - no appreciable M Abdomen: NT/ND, soft, PEG unremarkable   Extremities: 1+ B LE edema w/o signif change   CBC: Recent Labs  Lab 08/19/17 0450 08/20/17 0500 08/22/17 0349 08/23/17 0410 08/24/17 0349  WBC 17.5* 14.4* 12.5* 10.9* 10.5  HGB 9.9* 9.2* 8.9* 8.9* 9.4*  HCT 31.1* 29.5* 28.7* 28.8* 29.6*  MCV 96.6 96.7 98.3 97.0  96.1  PLT 535* 583* 536* 520* 616*   Basic Metabolic Panel: Recent Labs  Lab 08/19/17 0450 08/20/17 0500 08/21/17 0930 08/22/17 0349 08/23/17 0410 08/24/17 0349  NA 147* 150* 149* 146* 141 140  K 3.2* 3.4* 3.7 3.4* 3.3* 3.6  CL 119* 122* 124* 120* 114* 113*  CO2 16* 16* 16* 17* 17* 17*  GLUCOSE 131* 146* 233* 166* 203* 192*  BUN 21* 17 25* 28* 27* 27*  CREATININE 0.98 1.01* 1.19* 1.04* 0.99 0.92  CALCIUM 8.3* 8.4* 8.2* 8.3* 8.2* 8.5*  MG 1.7 2.1  --   --  1.6* 1.9  PHOS 3.8 3.3  --   --   --   --    GFR: Estimated Creatinine Clearance: 72.5 mL/min (by C-G formula based on SCr of 0.92 mg/dL).  Liver Function  Tests: Recent Labs  Lab 08/21/17 0930 08/23/17 0410  AST 51*  --   ALT 29  --   ALKPHOS 61  --   BILITOT 0.3  --   PROT 6.3*  --   ALBUMIN 1.3* 1.3*   Coagulation Profile: Recent Labs  Lab 08/17/17 0948  INR 1.64    HbA1C: Hgb A1c MFr Bld  Date/Time Value Ref Range Status  05/26/2017 10:35 PM 14.6 (H) 4.8 - 5.6 % Final    Comment:    (NOTE) Pre diabetes:          5.7%-6.4% Diabetes:              >6.4% Glycemic control for   <7.0% adults with diabetes   05/11/2017 08:08 AM 13.2 (H) 4.8 - 5.6 % Final    Comment:    (NOTE) Pre diabetes:          5.7%-6.4% Diabetes:              >6.4% Glycemic control for   <7.0% adults with diabetes     CBG: Recent Labs  Lab 08/23/17 1515 08/23/17 2000 08/24/17 0040 08/24/17 0340 08/24/17 0733  GLUCAP 269* 140* 210* 167* 155*    Scheduled Meds: . amLODipine  10 mg Per Tube Daily  . aspirin  81 mg Per Tube Daily  . bisacodyl  10 mg Rectal Daily  . chlorhexidine gluconate (MEDLINE KIT)  15 mL Mouth Rinse BID  . Chlorhexidine Gluconate Cloth  6 each Topical Daily  . docusate  50 mg Per Tube Daily  . enalapril  20 mg Per Tube Daily  . feeding supplement (PRO-STAT SUGAR FREE 64)  30 mL Per Tube BID  . feeding supplement (VITAL HIGH PROTEIN)  1,000 mL Per Tube Q24H  . free water  400 mL Per Tube Q4H  . heparin  5,000 Units Subcutaneous Q8H  . hydrALAZINE  50 mg Per Tube Q8H  . insulin aspart  0-20 Units Subcutaneous Q4H  . insulin glargine  24 Units Subcutaneous BID  . labetalol  400 mg Per Tube TID  . lacosamide  200 mg Oral BID  . levETIRAcetam  1,000 mg Per Tube BID  . mouth rinse  15 mL Mouth Rinse QID  . mupirocin cream   Topical Daily  . neomycin-bacitracin-polymyxin  1 application Topical Daily  . pantoprazole sodium  40 mg Per Tube QHS  . phenytoin  250 mg Per Tube Q6H  . potassium chloride  50 mEq Per Tube Daily  . sodium chloride flush  10-40 mL Intracatheter Q12H  . thiamine  100 mg Per Tube Daily  .  topiramate  200 mg Per Tube  BID     LOS: 13 days   Cherene Altes, MD [facility] Office  [phone] Pager - Text Page per Amion as per below:  On-Call/Text Page:      [email or web address]      password [email or web address]  If 7PM-7AM, please contact night-coverage [email or web address] Password [facility] 08/24/2017, 9:19 AM

## 2017-08-24 NOTE — Progress Notes (Signed)
08/24/2017 Patient came from 5m to 47m at 1450. She is alert to self and  non ambulatory. Patient grimace to pain. Shave stage 2 on the coccyx and sacrum foam dressing to the area. Patient have a foam dressing on the right lower leg area looks heal, no drainage to that area. Patient also came to the unit with flexiseal and picc line. She have swelling in arms mostly noted in hand and heels dry. [facility] RN.

## 2017-08-25 LAB — CBC
HEMATOCRIT: 32.7 % — AB (ref 36.0–46.0)
Hemoglobin: 10.1 g/dL — ABNORMAL LOW (ref 12.0–15.0)
MCH: 29.6 pg (ref 26.0–34.0)
MCHC: 30.9 g/dL (ref 30.0–36.0)
MCV: 95.9 fL (ref 78.0–100.0)
Platelets: 492 10*3/uL — ABNORMAL HIGH (ref 150–400)
RBC: 3.41 MIL/uL — AB (ref 3.87–5.11)
RDW: 15.7 % — AB (ref 11.5–15.5)
WBC: 10.7 10*3/uL — AB (ref 4.0–10.5)

## 2017-08-25 LAB — BASIC METABOLIC PANEL
ANION GAP: 12 (ref 5–15)
BUN: 27 mg/dL — ABNORMAL HIGH (ref 6–20)
CHLORIDE: 111 mmol/L (ref 101–111)
CO2: 17 mmol/L — ABNORMAL LOW (ref 22–32)
Calcium: 8.9 mg/dL (ref 8.9–10.3)
Creatinine, Ser: 0.84 mg/dL (ref 0.44–1.00)
GFR calc non Af Amer: >60 mL/min (ref >60)
Glucose, Bld: 158 mg/dL — ABNORMAL HIGH (ref 65–99)
POTASSIUM: 3.7 mmol/L (ref 3.5–5.1)
SODIUM: 140 mmol/L (ref 135–145)

## 2017-08-25 LAB — GLUCOSE, CAPILLARY
GLUCOSE-CAPILLARY: 182 mg/dL — AB (ref 65–99)
GLUCOSE-CAPILLARY: 234 mg/dL — AB (ref 65–99)
GLUCOSE-CAPILLARY: 195 mg/dL — AB (ref 65–99)
Glucose-Capillary: 150 mg/dL — ABNORMAL HIGH (ref 65–99)
Glucose-Capillary: 165 mg/dL — ABNORMAL HIGH (ref 65–99)
Glucose-Capillary: 166 mg/dL — ABNORMAL HIGH (ref 65–99)
Glucose-Capillary: 168 mg/dL — ABNORMAL HIGH (ref 65–99)
Glucose-Capillary: 186 mg/dL — ABNORMAL HIGH (ref 65–99)

## 2017-08-25 LAB — MAGNESIUM: MAGNESIUM: 1.8 mg/dL (ref 1.7–2.4)

## 2017-08-25 MED ORDER — FREE WATER
200.0000 mL | Freq: Three times a day (TID) | Status: DC
  Administered 2017-08-25 – 2017-09-02 (×22): 200 mL

## 2017-08-25 MED ORDER — HYDRALAZINE HCL 50 MG PO TABS
100.0000 mg | ORAL_TABLET | Freq: Three times a day (TID) | ORAL | Status: DC
  Administered 2017-08-25 – 2017-09-14 (×59): 100 mg
  Filled 2017-08-25 (×62): qty 2

## 2017-08-25 MED ORDER — INSULIN GLARGINE 100 UNIT/ML ~~LOC~~ SOLN
26.0000 [IU] | Freq: Two times a day (BID) | SUBCUTANEOUS | Status: DC
  Administered 2017-08-25 – 2017-08-26 (×2): 26 [IU] via SUBCUTANEOUS
  Filled 2017-08-25 (×3): qty 0.26

## 2017-08-25 MED ORDER — FUROSEMIDE 20 MG PO TABS
20.0000 mg | ORAL_TABLET | Freq: Two times a day (BID) | ORAL | Status: DC
  Administered 2017-08-25 – 2017-08-26 (×2): 20 mg
  Filled 2017-08-25 (×2): qty 1

## 2017-08-25 NOTE — Progress Notes (Signed)
[place] TEAM 1 - Stepdown/ICU TEAM  Wynn Banker  YFV:[id number] DOB: May 16, 1956 DOA: 08/02/2017 PCP: Nolene Ebbs, MD    Brief Narrative:  62yo F w/ a Hx of polysubstance abuse (EtOH, Cocaine), DM, and HTN who had been previously admitted to this hospital for a left thalamic hemorrhage. She was found down the day of this admit in a puddle of stool and urine, and brought to the ED where she had overt clinical seizure activity.She was treated with Keppra fosphenytoin and Ativan resulting in arrest of the seizures.    Significant Events: 1/24 admit - intubated 1/25 TTE - EF 55-60% - grade 2 DD 2/6 tracheostomy  2/8 PEG tube in IR  2/11 TRH assumed care   Subjective: No signif change in the patient's neurologic status.  Unresponsive.    Assessment & Plan:  Seizure - refractory status epilepticus  Due to non-compliance w/ seizure meds, hyperosmolar state, and cocaine use - potentially seizing for days upon arrival (had not been seen for 2 days prior to being found) - Neuro suggests no attempts to wean AEDs for at least 2 months - signif injury to brain thought to have occurred - no clinical evidence of any improvement at this time   Pseudomonas pneumonia Completed course of treatment - afebrile   Acute hypoxic respiratory failure Vent dependent - s/p trach - vent per PCCM  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 in setting of HTN - refused SNF placement at that time  Chronic diastolic CHF Developing low grade anasarca, likely primarily due to albumin of 1.3 - overall weight is actually on a downward trend - attempt to minimize w/ diuretic as able   Filed Weights   08/23/17 0500 08/24/17 0500 08/25/17 0333  Weight: 91.8 kg (202 lb 6.1 oz) 91.1 kg (200 lb 13.4 oz) 89.4 kg (197 lb 1.5 oz)    DM CBG reasonably controlled   HTN  BP overall trending upward - adjust tx and follow   Hypernatremia  Corrected   Hypokalemia Cont to supplement and follow to  goal of 4.0  Normocytic Anemia No evidence of acute blood loss - Hgb stable   DVT prophylaxis: SCDs Code Status: FULL CODE Family Communication: no family present at time of exam  Disposition Plan: will need a vent capable SNF - placement proving to be difficult    Consultants:  Neurology PCCM  Antimicrobials:  Vancomycin 1/30 > 1/31 Zosyn 1/30 > 2/5  Objective: Blood pressure (!) 173/88, pulse 94, temperature 99.4 F (37.4 C), temperature source Oral, resp. rate (!) 35, height 5' 5" (1.651 m), weight 89.4 kg (197 lb 1.5 oz), SpO2 98 %.  Intake/Output Summary (Last 24 hours) at 08/25/2017 1603 Last data filed at 08/25/2017 1000 Gross per 24 hour  Intake 2020 ml  Output 850 ml  Net 1170 ml   Filed Weights   08/23/17 0500 08/24/17 0500 08/25/17 0333  Weight: 91.8 kg (202 lb 6.1 oz) 91.1 kg (200 lb 13.4 oz) 89.4 kg (197 lb 1.5 oz)    Examination: General: No acute respiratory distress - onvent  Lungs: CTA B - no wheeze Cardiovascular: RRR  Abdomen: NT/ND, soft Extremities: 1+ anasarca  CBC: Recent Labs  Lab 08/20/17 0500 08/22/17 0349 08/23/17 0410 08/24/17 0349 08/25/17 0431  WBC 14.4* 12.5* 10.9* 10.5 10.7*  HGB 9.2* 8.9* 8.9* 9.4* 10.1*  HCT 29.5* 28.7* 28.8* 29.6* 32.7*  MCV 96.7 98.3 97.0 96.1 95.9  PLT 583* 536* 520* 535* 492*   Basic  Metabolic Panel: Recent Labs  Lab 08/19/17 0450 08/20/17 0500 08/21/17 0930 08/22/17 0349 08/23/17 0410 08/24/17 0349 08/25/17 0431  NA 147* 150* 149* 146* 141 140 140  K 3.2* 3.4* 3.7 3.4* 3.3* 3.6 3.7  CL 119* 122* 124* 120* 114* 113* 111  CO2 16* 16* 16* 17* 17* 17* 17*  GLUCOSE 131* 146* 233* 166* 203* 192* 158*  BUN 21* 17 25* 28* 27* 27* 27*  CREATININE 0.98 1.01* 1.19* 1.04* 0.99 0.92 0.84  CALCIUM 8.3* 8.4* 8.2* 8.3* 8.2* 8.5* 8.9  MG 1.7 2.1  --   --  1.6* 1.9 1.8  PHOS 3.8 3.3  --   --   --   --   --    GFR: Estimated Creatinine Clearance: 78.7 mL/min (by C-G formula based on SCr of 0.84  mg/dL).  Liver Function Tests: Recent Labs  Lab 08/21/17 0930 08/23/17 0410  AST 51*  --   ALT 29  --   ALKPHOS 61  --   BILITOT 0.3  --   PROT 6.3*  --   ALBUMIN 1.3* 1.3*    HbA1C: Hgb A1c MFr Bld  Date/Time Value Ref Range Status  05/26/2017 10:35 PM 14.6 (H) 4.8 - 5.6 % Final    Comment:    (NOTE) Pre diabetes:          5.7%-6.4% Diabetes:              >6.4% Glycemic control for   <7.0% adults with diabetes   05/11/2017 08:08 AM 13.2 (H) 4.8 - 5.6 % Final    Comment:    (NOTE) Pre diabetes:          5.7%-6.4% Diabetes:              >6.4% Glycemic control for   <7.0% adults with diabetes     CBG: Recent Labs  Lab 08/24/17 2015 08/24/17 2308 08/25/17 0334 08/25/17 0726 08/25/17 1111  GLUCAP 166* 188* 168* 182* 234*    Scheduled Meds: . amLODipine  10 mg Per Tube Daily  . aspirin  81 mg Per Tube Daily  . bisacodyl  10 mg Rectal Daily  . chlorhexidine gluconate (MEDLINE KIT)  15 mL Mouth Rinse BID  . Chlorhexidine Gluconate Cloth  6 each Topical Daily  . docusate  50 mg Per Tube Daily  . enalapril  20 mg Per Tube Daily  . feeding supplement (PRO-STAT SUGAR FREE 64)  30 mL Per Tube BID  . feeding supplement (VITAL HIGH PROTEIN)  1,000 mL Per Tube Q24H  . free water  300 mL Per Tube Q4H  . furosemide  20 mg Per Tube Daily  . heparin  5,000 Units Subcutaneous Q8H  . hydrALAZINE  50 mg Per Tube Q8H  . insulin aspart  0-20 Units Subcutaneous Q4H  . insulin glargine  24 Units Subcutaneous BID  . labetalol  400 mg Per Tube TID  . lacosamide  200 mg Per Tube BID  . levETIRAcetam  1,000 mg Per Tube BID  . mouth rinse  15 mL Mouth Rinse 10 times per day  . mupirocin cream   Topical Daily  . pantoprazole sodium  40 mg Per Tube QHS  . phenytoin  250 mg Per Tube Q6H  . potassium chloride  50 mEq Per Tube Daily  . sodium bicarbonate  650 mg Per Tube TID  . thiamine  100 mg Per Tube Daily  . topiramate  200 mg Per Tube BID     LOS:  23 days   Cherene Altes, MD [facility] Office  [phone] Pager - Text Page per Amion as per below:  On-Call/Text Page:      [email or web address]      password [email or web address]  If 7PM-7AM, please contact night-coverage [email or web address] Password [email or web address] 08/25/2017, 4:03 PM

## 2017-08-26 LAB — BASIC METABOLIC PANEL
Anion gap: 10 (ref 5–15)
BUN: 27 mg/dL — AB (ref 6–20)
CALCIUM: 8.6 mg/dL — AB (ref 8.9–10.3)
CO2: 18 mmol/L — ABNORMAL LOW (ref 22–32)
CREATININE: 0.86 mg/dL (ref 0.44–1.00)
Chloride: 110 mmol/L (ref 101–111)
GFR calc Af Amer: >60 mL/min (ref >60)
GFR calc non Af Amer: >60 mL/min (ref >60)
GLUCOSE: 259 mg/dL — AB (ref 65–99)
Potassium: 3.6 mmol/L (ref 3.5–5.1)
Sodium: 138 mmol/L (ref 135–145)

## 2017-08-26 LAB — CULTURE, RESPIRATORY W GRAM STAIN

## 2017-08-26 LAB — GLUCOSE, CAPILLARY
GLUCOSE-CAPILLARY: 273 mg/dL — AB (ref 65–99)
GLUCOSE-CAPILLARY: 215 mg/dL — AB (ref 65–99)
GLUCOSE-CAPILLARY: 200 mg/dL — AB (ref 65–99)
GLUCOSE-CAPILLARY: 223 mg/dL — AB (ref 65–99)
Glucose-Capillary: 203 mg/dL — ABNORMAL HIGH (ref 65–99)
Glucose-Capillary: 223 mg/dL — ABNORMAL HIGH (ref 65–99)
Glucose-Capillary: 232 mg/dL — ABNORMAL HIGH (ref 65–99)

## 2017-08-26 LAB — CULTURE, RESPIRATORY

## 2017-08-26 MED ORDER — FENTANYL CITRATE (PF) 100 MCG/2ML IJ SOLN
50.0000 ug | INTRAMUSCULAR | Status: DC | PRN
  Administered 2017-08-26 – 2017-09-02 (×15): 50 ug via INTRAVENOUS
  Filled 2017-08-26 (×16): qty 2

## 2017-08-26 MED ORDER — FUROSEMIDE 40 MG PO TABS
40.0000 mg | ORAL_TABLET | Freq: Two times a day (BID) | ORAL | Status: DC
  Administered 2017-08-26 – 2017-09-14 (×38): 40 mg
  Filled 2017-08-26 (×39): qty 1

## 2017-08-26 MED ORDER — DOCUSATE SODIUM 50 MG/5ML PO LIQD
50.0000 mg | Freq: Two times a day (BID) | ORAL | Status: DC | PRN
  Administered 2017-09-07 – 2017-09-09 (×4): 50 mg
  Filled 2017-08-26 (×4): qty 10

## 2017-08-26 MED ORDER — INSULIN GLARGINE 100 UNIT/ML ~~LOC~~ SOLN
28.0000 [IU] | Freq: Two times a day (BID) | SUBCUTANEOUS | Status: DC
  Administered 2017-08-26 – 2017-08-28 (×4): 28 [IU] via SUBCUTANEOUS
  Filled 2017-08-26 (×4): qty 0.28

## 2017-08-26 MED ORDER — CIPROFLOXACIN IN D5W 400 MG/200ML IV SOLN
400.0000 mg | Freq: Two times a day (BID) | INTRAVENOUS | Status: DC
  Administered 2017-08-26 – 2017-09-03 (×16): 400 mg via INTRAVENOUS
  Filled 2017-08-26 (×16): qty 200

## 2017-08-26 NOTE — Progress Notes (Signed)
[place] TEAM 1 - Stepdown/ICU TEAM  Wynn Banker  JQG:[id number] DOB: March 11, 1956 DOA: 08/02/2017 PCP: Nolene Ebbs, MD    Brief Narrative:  62yo F w/ a Hx of polysubstance abuse (EtOH, Cocaine), DM, and HTN who had been previously admitted to this hospital for a left thalamic hemorrhage. She was found down the day of this admit in a puddle of stool and urine, and brought to the ED where she had overt clinical seizure activity.She was treated with Keppra fosphenytoin and Ativan resulting in arrest of the seizures.    Significant Events: 1/24 admit - intubated 1/25 TTE - EF 55-60% - grade 2 DD 2/6 tracheostomy  2/8 PEG tube in IR  2/11 TRH assumed care   Subjective:  RN has noted low grade fevers and increased tracheal secretions today.  She remains unresponsive.    Assessment & Plan:  Seizure - refractory status epilepticus  Due to non-compliance w/ seizure meds, hyperosmolar state, and cocaine use - potentially seizing for days upon arrival (had not been seen for 2 days prior to being found) - Neuro suggests no attempts to wean AEDs for at least 2 months - signif injury to brain thought to have occurred - no clinical evidence of any improvement at this time   Pseudomonas pneumonia Completed course of treatment - had been afebrile - now increased secretions and low grade fever returning - f/u tracheal aspirate confirms pan-sensitive Pseudomonas - will resume abx for planned extended tx course   Acute hypoxic respiratory failure Vent dependent - s/p trach - vent per PCCM  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 in setting of HTN - refused SNF placement at that time  Chronic diastolic CHF Developing low grade anasarca, likely primarily due to albumin of 1.3 - overall weight is actually on a downward trend - attempt to minimize w/ diuretic as able   [facility] Weights   08/24/17 0500 08/25/17 0333 08/26/17 0359  Weight: 91.1 kg (200 lb 13.4 oz) 89.4 kg (197 lb 1.5  oz) 84 kg (185 lb 3 oz)    DM CBG climbing somewhat - gently adjust tx and follow    HTN  BP not yet at goal - adjust tx and follow   Hypernatremia  Corrected - decreasing free water   Hypokalemia Cont to supplement and follow to goal of 4.0  Normocytic Anemia No evidence of acute blood loss - Hgb stable   DVT prophylaxis: SCDs Code Status: FULL CODE Family Communication: no family present at time of exam  Disposition Plan: will need a vent capable SNF - placement proving to be difficult    Consultants:  Neurology PCCM  Antimicrobials:  Vancomycin 1/30 > 1/31 Zosyn 1/30 > 2/5 Cipro 2/17 >  Objective: Blood pressure (!) 175/75, pulse 99, temperature 99.1 F (37.3 C), temperature source Axillary, resp. rate (!) 43, height '5\' 5"'$  (1.651 m), weight 84 kg (185 lb 3 oz), SpO2 100 %.  Intake/Output Summary (Last 24 hours) at 08/26/2017 1529 Last data filed at 08/26/2017 1400 Gross per 24 hour  Intake 1820 ml  Output 2200 ml  Net -380 ml   Filed Weights   08/24/17 0500 08/25/17 0333 08/26/17 0359  Weight: 91.1 kg (200 lb 13.4 oz) 89.4 kg (197 lb 1.5 oz) 84 kg (185 lb 3 oz)    Examination: General: No acute respiratory distress - diaphoretic  Lungs: CTA B - some course upper airway sounds  Cardiovascular: RRR - no M  Abdomen: NT/ND, soft, BS+ Extremities:  1+ anasarca w/o signif change   CBC: Recent Labs  Lab 08/20/17 0500 08/22/17 0349 08/23/17 0410 08/24/17 0349 08/25/17 0431  WBC 14.4* 12.5* 10.9* 10.5 10.7*  HGB 9.2* 8.9* 8.9* 9.4* 10.1*  HCT 29.5* 28.7* 28.8* 29.6* 32.7*  MCV 96.7 98.3 97.0 96.1 95.9  PLT 583* 536* 520* 535* 086*   Basic Metabolic Panel: Recent Labs  Lab 08/20/17 0500  08/22/17 0349 08/23/17 0410 08/24/17 0349 08/25/17 0431 08/26/17 0513  NA 150*   < > 146* 141 140 140 138  K 3.4*   < > 3.4* 3.3* 3.6 3.7 3.6  CL 122*   < > 120* 114* 113* 111 110  CO2 16*   < > 17* 17* 17* 17* 18*  GLUCOSE 146*   < > 166* 203* 192* 158* 259*    BUN 17   < > 28* 27* 27* 27* 27*  CREATININE 1.01*   < > 1.04* 0.99 0.92 0.84 0.86  CALCIUM 8.4*   < > 8.3* 8.2* 8.5* 8.9 8.6*  MG 2.1  --   --  1.6* 1.9 1.8  --   PHOS 3.3  --   --   --   --   --   --    < > = values in this interval not displayed.   GFR: Estimated Creatinine Clearance: 74.5 mL/min (by C-G formula based on SCr of 0.86 mg/dL).  Liver Function Tests: Recent Labs  Lab 08/21/17 0930 08/23/17 0410  AST 51*  --   ALT 29  --   ALKPHOS 61  --   BILITOT 0.3  --   PROT 6.3*  --   ALBUMIN 1.3* 1.3*    HbA1C: Hgb A1c MFr Bld  Date/Time Value Ref Range Status  05/26/2017 10:35 PM 14.6 (H) 4.8 - 5.6 % Final    Comment:    (NOTE) Pre diabetes:          5.7%-6.4% Diabetes:              >6.4% Glycemic control for   <7.0% adults with diabetes   05/11/2017 08:08 AM 13.2 (H) 4.8 - 5.6 % Final    Comment:    (NOTE) Pre diabetes:          5.7%-6.4% Diabetes:              >6.4% Glycemic control for   <7.0% adults with diabetes     CBG: Recent Labs  Lab 08/25/17 2347 08/26/17 0354 08/26/17 0751 08/26/17 1221 08/26/17 1523  GLUCAP 150* 203* 232* 273* 215*    Scheduled Meds: . amLODipine  10 mg Per Tube Daily  . aspirin  81 mg Per Tube Daily  . bisacodyl  10 mg Rectal Daily  . chlorhexidine gluconate (MEDLINE KIT)  15 mL Mouth Rinse BID  . Chlorhexidine Gluconate Cloth  6 each Topical Daily  . docusate  50 mg Per Tube Daily  . enalapril  20 mg Per Tube Daily  . feeding supplement (PRO-STAT SUGAR FREE 64)  30 mL Per Tube BID  . feeding supplement (VITAL HIGH PROTEIN)  1,000 mL Per Tube Q24H  . free water  200 mL Per Tube Q8H  . furosemide  20 mg Per Tube BID  . heparin  5,000 Units Subcutaneous Q8H  . hydrALAZINE  100 mg Per Tube Q8H  . insulin aspart  0-20 Units Subcutaneous Q4H  . insulin glargine  26 Units Subcutaneous BID  . labetalol  400 mg Per Tube TID  .  lacosamide  200 mg Per Tube BID  . levETIRAcetam  1,000 mg Per Tube BID  . mouth rinse   15 mL Mouth Rinse 10 times per day  . mupirocin cream   Topical Daily  . pantoprazole sodium  40 mg Per Tube QHS  . phenytoin  250 mg Per Tube Q6H  . potassium chloride  50 mEq Per Tube Daily  . sodium bicarbonate  650 mg Per Tube TID  . thiamine  100 mg Per Tube Daily  . topiramate  200 mg Per Tube BID     LOS: 24 days   Cherene Altes, MD [facility] Office  [phone] Pager - Text Page per Amion as per below:  On-Call/Text Page:      [email or web address]      password [email or web address]  If 7PM-7AM, please contact night-coverage [email or web address] Password [email or web address] 08/26/2017, 3:29 PM

## 2017-08-26 NOTE — Plan of Care (Signed)
Patient with low grade fever and increased secretions, suctioning performed and PRNs given.

## 2017-08-27 LAB — GLUCOSE, CAPILLARY
GLUCOSE-CAPILLARY: 149 mg/dL — AB (ref 65–99)
GLUCOSE-CAPILLARY: 163 mg/dL — AB (ref 65–99)
GLUCOSE-CAPILLARY: 180 mg/dL — AB (ref 65–99)
Glucose-Capillary: 172 mg/dL — ABNORMAL HIGH (ref 65–99)
Glucose-Capillary: 184 mg/dL — ABNORMAL HIGH (ref 65–99)
Glucose-Capillary: 166 mg/dL — ABNORMAL HIGH (ref 65–99)

## 2017-08-27 LAB — COMPREHENSIVE METABOLIC PANEL
ALBUMIN: 1.6 g/dL — AB (ref 3.5–5.0)
ALK PHOS: 73 U/L (ref 38–126)
ALT: 25 U/L (ref 14–54)
ANION GAP: 12 (ref 5–15)
AST: 34 U/L (ref 15–41)
BILIRUBIN TOTAL: 0.4 mg/dL (ref 0.3–1.2)
BUN: 28 mg/dL — ABNORMAL HIGH (ref 6–20)
CALCIUM: 8.6 mg/dL — AB (ref 8.9–10.3)
CO2: 18 mmol/L — AB (ref 22–32)
Chloride: 107 mmol/L (ref 101–111)
Creatinine, Ser: 0.95 mg/dL (ref 0.44–1.00)
GFR calc non Af Amer: >60 mL/min (ref >60)
GLUCOSE: 266 mg/dL — AB (ref 65–99)
Potassium: 3.5 mmol/L (ref 3.5–5.1)
SODIUM: 137 mmol/L (ref 135–145)
TOTAL PROTEIN: 6.7 g/dL (ref 6.5–8.1)

## 2017-08-27 NOTE — Progress Notes (Signed)
PULMONARY / CRITICAL CARE MEDICINE   Name: [name] MRN: [id number] DOB: 13-Sep-1955    ADMISSION DATE:  08/02/2017 CONSULTATION DATE:  08/02/2017  CHIEF COMPLAINT:  Found down  HISTORY OF PRESENT ILLNESS:   62 y/o female with a history of left thalamic hemorrhage previously who was admitted with seizure activity.     SUBJECTIVE:  No change.  Not weaning.     VITAL SIGNS: BP 126/67   Pulse 88   Temp 98.6 F (37 C) (Oral)   Resp (!) 24   Ht 5\' 5"  (1.651 m)   Wt 87.1 kg (192 lb 0.3 oz)   SpO2 99%   BMI 31.95 kg/m   HEMODYNAMICS:    VENTILATOR SETTINGS: Vent Mode: PSV;CPAP FiO2 (%):  [30 %] 30 % Set Rate:  [24 bmp] 24 bmp Vt Set:  [500 mL] 500 mL PEEP:  [5 cmH20] 5 cmH20 Pressure Support:  [10 cmH20] 10 cmH20 Plateau Pressure:  [20 cmH20-29 cmH20] 29 cmH20  INTAKE / OUTPUT: I/O last 3 completed shifts: In: 3100 [I.V.:480; Other:260; NG/GT:1960; IV Piggyback:400] Out: 4800 [Urine:4400; Stool:400]  PHYSICAL EXAMINATION:  General: 62 year old female NAD on vent  HEENT: mm moist, #6 cuffed Shiley unremarkable Pulmonary: resps even non labored on vent, not weaning, few rhonchi Cardiac: Regular rate and rhythm Extremities/musculoskeletal: Generalized anasarca, generalized weakness Abdomen: soft, +bs  Neuro: opens eyes, does not track, no response   LABS:  BMET Recent Labs  Lab 08/25/17 0431 08/26/17 0513 08/27/17 0439  NA 140 138 137  K 3.7 3.6 3.5  CL 111 110 107  CO2 17* 18* 18*  BUN 27* 27* 28*  CREATININE 0.84 0.86 0.95  GLUCOSE 158* 259* 266*   Electrolytes Recent Labs  Lab 08/23/17 0410 08/24/17 0349 08/25/17 0431 08/26/17 0513 08/27/17 0439  CALCIUM 8.2* 8.5* 8.9 8.6* 8.6*  MG 1.6* 1.9 1.8  --   --    CBC Recent Labs  Lab 08/23/17 0410 08/24/17 0349 08/25/17 0431  WBC 10.9* 10.5 10.7*  HGB 8.9* 9.4* 10.1*  HCT 28.8* 29.6* 32.7*  PLT 520* 535* 492*   Coag's No results for input(s): APTT, INR in the last 168  hours. Sepsis Markers No results for input(s): LATICACIDVEN, PROCALCITON, O2SATVEN in the last 168 hours.  ABG No results for input(s): PHART, PCO2ART, PO2ART in the last 168 hours.  Liver Enzymes Recent Labs  Lab 08/21/17 0930 08/23/17 0410 08/27/17 0439  AST 51*  --  34  ALT 29  --  25  ALKPHOS 61  --  73  BILITOT 0.3  --  0.4  ALBUMIN 1.3* 1.3* 1.6*   Cardiac Enzymes No results for input(s): TROPONINI, PROBNP in the last 168 hours.  Glucose Recent Labs  Lab 08/26/17 1523 08/26/17 1640 08/26/17 1950 08/27/17 0003 08/27/17 0407 08/27/17 0810  GLUCAP 215* 200* 223* 223* 172* 184*   Imaging No results found. STUDIES:  EEG 1/24>>>This is an abnormal EEG.There is evidence of moderate generalized slowing of brain activity as well as severe focal slowing in the left temporal lobe. The patient is not in non-convulsive status epilepticus. 2D echo 1/25>>> EF 19-37%, grade 2 diastolic dysfunction  MRI brain 1/25>>> 1. No acute intracranial abnormality. 2. 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 3. Remote hemorrhage in the left thalamus and left pons. 4. Age advanced atrophy. 5. Fluid in the nasopharynx and paranasal sinuses is likely secondary to intubation.  CULTURES: Blood Cx 1/30>>> Respiratory 1/30>>>Psuedomonas  2/15 sputum>>> pseudomonas   ANTIBIOTICS: Vancomycin 1/30>>>  1/31 Zosyn 1/30>>> 2/5  LINES/TUBES: CVC 1/24>>>2/6 ETT 1/24>>>2/6 NG/OG 1/24>>> Tracheostomy 2/6>> PICC 2/6 >>   ASSESSMENT / PLAN:  Acute respiratory failure with hypoxemia-Improving Tracheostomy status HTN Anemia, no bleeding  Pseudomonas Pneumonia  Hyperglycemia  Non-anion gap metabolic acidosis suspect due to hyperchloremia Watershed infarct/ischemic encephalopathy Acute encephalopathy Status epilepticus   Pulmonary problem 62 year old female who is tracheostomy and ventilator dependent setting of ischemic encephalopathy Course has been complicated by  refractory status epilepticus, Pseudomonas pneumonia, and volume overload.  We have not been able to liberate her from the ventilator, she is not a candidate for decannulation.   Plan/rec Follow-up chest x-ray intermittently  Diuresis as tol  Continue attempts at PS wean but has not tolerated - highly doubt she will be completely liberated from vent  Continue search for vent SNF  Palliative care following   PCCM will continue to follow intermittently for trach/vent.   Nickolas Madrid, NP 08/27/2017  11:07 AM Pager: [phone] or [phone]

## 2017-08-27 NOTE — Progress Notes (Signed)
CSW Surveyor, quantity spoke with Royston Bake [phone] this morning regarding her mother's needs. She understands Palliative Care Service has attempted to reach her, states she will be available on her phone for the remainder of the day.   In regards to discharge planning, Ms. Tanda Rockers plans to follow up with the vent skilled nursing facility in [place] this morning.  Elana Alm, Environmental manager Clinical Social Work [employer] [phone]

## 2017-08-27 NOTE — Care Management Note (Addendum)
Case Management Note  Patient Details  Name: Bryahna Lesko MRN: [id number] Date of Birth: 1956-02-04  Subjective/Objective:  Per previous NCM note from [place] admitted post being found down  - once she arrived to [facility] ED pt was found to have overt clinical seizure activity - pt remains ventilated.  Toxicology screen postive for cocaine.  PTA was home with [facility] services with [facility] but was discharged due to noncompliance.      2/20 Tomi Bamberger RN,BSN- conts on full vent support, conts on cipro, iv abx. Plan for vent/snf.   2/25 Tomi Bamberger RN, BSN- Trach Vent Dependent , seizures,unable to tolerate wean, remains commatose, conts on hep drip, labetolol iv x 1, cipro iv dc'd yesterday, plan for vent snf. Palliative following.   2/27 Tomi Bamberger RN, BSN - making progress towards weaning, conts on TC 28%, speech working with her with PMV, if conts to tolerate TC will plan for cuffless on Friday per MD.  conts on hep drip.                   Action/Plan: Remains on vent with trach, Does not have LTACH benefits, plan is for Vent SNF if not liberated from vent, CSW following.    Expected Discharge Date:                  Expected Discharge Plan:  Skilled Nursing Facility  In-House Referral:  Clinical Social Work  Discharge planning Services  CM Consult  Post Acute Care Choice:    Choice offered to:     DME Arranged:    DME Agency:     HH Arranged:    [place] Agency:     Status of Service:  In process, will continue to follow  If discussed at Long Length of Stay Meetings, dates discussed:    Additional Comments:  Zenon Mayo, RN 08/27/2017, 10:11 AM

## 2017-08-27 NOTE — Progress Notes (Signed)
Palliative Care Progress Note  Have not heard back from family after leaving messages requesting return call to discuss goals of care.    It appears from chart review that family is invested in plan for continuation of current therapies for at least a period of time to see if she improves.  Therefore, she may be best served by requesting palliative to follow at next care venue to guide family in decision making after having time to see if she improves clinically.  We will certainly be happy to engage with family if they call back, but otherwise, I am not sure how best to be of assistance in the care of Ms. Surgeon at this time.   Please call if there are specific areas with which our team can be of assistance in the care of Ms. Vitelli.  NO CHARGE NOTE  Micheline Rough, MD [place] Team [phone]

## 2017-08-27 NOTE — Progress Notes (Signed)
[place] TEAM 1 - Stepdown/ICU TEAM  Wynn Banker  BJY:[id number] DOB: 1956-06-06 DOA: 08/02/2017 PCP: Nolene Ebbs, MD    Brief Narrative:  62yo F w/ a Hx of polysubstance abuse (EtOH, Cocaine), DM, and HTN who had been previously admitted to this hospital for a left thalamic hemorrhage (Nov 2018). She was found down the day of this admit in a puddle of stool and urine, and brought to the ED where she had overt clinical seizure activity.She was treated with Keppra fosphenytoin and Ativan resulting in arrest of the seizures.    Significant Events: 1/24 admit - intubated 1/25 TTE - EF 55-60% - grade 2 DD 2/6 tracheostomy  2/8 PEG tube in IR  2/11 TRH assumed care   Subjective: No signif change in clinical status.  Requiring frequent trach suctioning due to secretions.    Assessment & Plan:  Seizure - refractory status epilepticus  Due to non-compliance w/ seizure meds, hyperosmolar state, and cocaine use - potentially seizing for days upon arrival (had not been seen for 2 days prior to being found) - Neuro suggests no attempts to wean AEDs for at least 2 months - signif injury to brain thought to have occurred - no clinical evidence of any improvement at this time   Pseudomonas pneumonia Completed course of treatment - had been afebrile - now increased secretions and low grade fever returning - f/u tracheal aspirate confirms pan-sensitive Pseudomonas - resumed abx for planned extended tx course - fever curve appears to be trending downward - f/u WBC in AM   Acute hypoxic respiratory failure Vent dependent - s/p trach - vent per PCCM - to give trials at weaning from vent again   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 in setting of HTN - refused SNF placement at that time  Chronic diastolic CHF low grade anasarca persists, due to albumin of 1.3 - cont diuretic and follow   Filed Weights   08/25/17 0333 08/26/17 0359 08/27/17 0355  Weight: 89.4 kg (197 lb 1.5  oz) 84 kg (185 lb 3 oz) 87.1 kg (192 lb 0.3 oz)    DM CBG improving w/ adjustment in tx - follow w/o change for today    HTN  BP quite variable - suspect tracheal secretions and agitation on vent are complicating issue - follow w/o change for now   Hypernatremia  Corrected   Hypokalemia Cont to supplement and follow to goal of 4.0  Normocytic Anemia No evidence of acute blood loss - Hgb stable   DVT prophylaxis: SCDs Code Status: FULL CODE Family Communication: no family present at time of exam  Disposition Plan: will need a vent capable SNF - placement proving to be difficult - PCCM working on weaning off vent today   Consultants:  Neurology PCCM  Antimicrobials:  Vancomycin 1/30 > 1/31 Zosyn 1/30 > 2/5 Cipro 2/17 >  Objective: Blood pressure (!) 151/72, pulse 95, temperature 98.8 F (37.1 C), temperature source Oral, resp. rate (!) 28, height '5\' 5"'$  (1.651 m), weight 87.1 kg (192 lb 0.3 oz), SpO2 100 %.  Intake/Output Summary (Last 24 hours) at 08/27/2017 1411 Last data filed at 08/27/2017 0865 Gross per 24 hour  Intake 1400 ml  Output 2600 ml  Net -1200 ml   Filed Weights   08/25/17 0333 08/26/17 0359 08/27/17 0355  Weight: 89.4 kg (197 lb 1.5 oz) 84 kg (185 lb 3 oz) 87.1 kg (192 lb 0.3 oz)    Examination: General: No acute respiratory distress -  some coughing  Lungs: CTA B in peripheral fields  Cardiovascular: RRR  Abdomen: NT/ND, soft, BS+, no rebound Extremities: 1+ anasarca   CBC: Recent Labs  Lab 08/22/17 0349 08/23/17 0410 08/24/17 0349 08/25/17 0431  WBC 12.5* 10.9* 10.5 10.7*  HGB 8.9* 8.9* 9.4* 10.1*  HCT 28.7* 28.8* 29.6* 32.7*  MCV 98.3 97.0 96.1 95.9  PLT 536* 520* 535* 706*   Basic Metabolic Panel: Recent Labs  Lab 08/23/17 0410 08/24/17 0349 08/25/17 0431 08/26/17 0513 08/27/17 0439  NA 141 140 140 138 137  K 3.3* 3.6 3.7 3.6 3.5  CL 114* 113* 111 110 107  CO2 17* 17* 17* 18* 18*  GLUCOSE 203* 192* 158* 259* 266*  BUN 27*  27* 27* 27* 28*  CREATININE 0.99 0.92 0.84 0.86 0.95  CALCIUM 8.2* 8.5* 8.9 8.6* 8.6*  MG 1.6* 1.9 1.8  --   --    GFR: Estimated Creatinine Clearance: 68.6 mL/min (by C-G formula based on SCr of 0.95 mg/dL).  Liver Function Tests: Recent Labs  Lab 08/21/17 0930 08/23/17 0410 08/27/17 0439  AST 51*  --  34  ALT 29  --  25  ALKPHOS 61  --  73  BILITOT 0.3  --  0.4  PROT 6.3*  --  6.7  ALBUMIN 1.3* 1.3* 1.6*    HbA1C: Hgb A1c MFr Bld  Date/Time Value Ref Range Status  05/26/2017 10:35 PM 14.6 (H) 4.8 - 5.6 % Final    Comment:    (NOTE) Pre diabetes:          5.7%-6.4% Diabetes:              >6.4% Glycemic control for   <7.0% adults with diabetes   05/11/2017 08:08 AM 13.2 (H) 4.8 - 5.6 % Final    Comment:    (NOTE) Pre diabetes:          5.7%-6.4% Diabetes:              >6.4% Glycemic control for   <7.0% adults with diabetes     CBG: Recent Labs  Lab 08/26/17 1950 08/27/17 0003 08/27/17 0407 08/27/17 0810 08/27/17 1223  GLUCAP 223* 223* 172* 184* 180*    Scheduled Meds: . amLODipine  10 mg Per Tube Daily  . aspirin  81 mg Per Tube Daily  . chlorhexidine gluconate (MEDLINE KIT)  15 mL Mouth Rinse BID  . Chlorhexidine Gluconate Cloth  6 each Topical Daily  . enalapril  20 mg Per Tube Daily  . feeding supplement (PRO-STAT SUGAR FREE 64)  30 mL Per Tube BID  . feeding supplement (VITAL HIGH PROTEIN)  1,000 mL Per Tube Q24H  . free water  200 mL Per Tube Q8H  . furosemide  40 mg Per Tube BID  . heparin  5,000 Units Subcutaneous Q8H  . hydrALAZINE  100 mg Per Tube Q8H  . insulin aspart  0-20 Units Subcutaneous Q4H  . insulin glargine  28 Units Subcutaneous BID  . labetalol  400 mg Per Tube TID  . lacosamide  200 mg Per Tube BID  . levETIRAcetam  1,000 mg Per Tube BID  . mouth rinse  15 mL Mouth Rinse 10 times per day  . mupirocin cream   Topical Daily  . pantoprazole sodium  40 mg Per Tube QHS  . phenytoin  250 mg Per Tube Q6H  . potassium chloride   50 mEq Per Tube Daily  . thiamine  100 mg Per Tube Daily  . topiramate  200 mg Per Tube BID     LOS: 25 days   Cherene Altes, MD [facility] Office  [phone] Pager - Text Page per Amion as per below:  On-Call/Text Page:      [email or web address]      password [email or web address]  If 7PM-7AM, please contact night-coverage [email or web address] Password [facility] 08/27/2017, 2:11 PM

## 2017-08-27 NOTE — Progress Notes (Signed)
CSW received call from [facility] representative and was informed that she has spoken with daughter and explained that she is willing to meet pt's daughter or another person that daughter appoints to meet [place] with needed information in order to do [place] application for [place]. Jeanie expressed that she would follow up with CSW as information as gathered from daughter. Noreene Larsson expressed that if needed documents are obtained then she can meet Thursday to do paperwork and admit pt potentially on Friday of this week. CSW will verbally handoff to 3W CSW for further help with this case. 48M CSW signing off at this time.     Virgie Dad Jerrick Farve, MSW, [facility] Clinical Social Worker [phone]

## 2017-08-27 NOTE — Progress Notes (Signed)
Inpatient Diabetes Program Recommendations  AACE/ADA: New Consensus Statement on Inpatient Glycemic Control (2015)  Target Ranges:  Prepandial:   less than 140 mg/dL      Peak postprandial:   less than 180 mg/dL (1-2 hours)      Critically ill patients:  140 - 180 mg/dL   Lab Results  Component Value Date   GLUCAP 184 (H) 08/27/2017   HGBA1C 14.6 (H) 05/26/2017    Review of Glycemic Control  Diabetes history: Type 2 DM  Current orders for Inpatient glycemic control: Lantus 28 Units BID, Novolog 0-20 Units Q4H  Inpatient Diabetes Program Recommendations:  Please consider tube feed coverage Novolog 4 Units Q4H.   Thanks, Bronson Curb, MSN, RNC-OB Diabetes Coordinator [phone] (8a-5p)

## 2017-08-27 NOTE — Plan of Care (Signed)
Pt has a non-healing stage 2 on her sacrum. RN cleansed wound and placed a new foam. Pt has been repositioned q2hours throughout the shift. Will continue to monitor.

## 2017-08-28 LAB — GLUCOSE, CAPILLARY
GLUCOSE-CAPILLARY: 213 mg/dL — AB (ref 65–99)
GLUCOSE-CAPILLARY: 252 mg/dL — AB (ref 65–99)
Glucose-Capillary: 186 mg/dL — ABNORMAL HIGH (ref 65–99)
Glucose-Capillary: 196 mg/dL — ABNORMAL HIGH (ref 65–99)
Glucose-Capillary: 211 mg/dL — ABNORMAL HIGH (ref 65–99)
Glucose-Capillary: 252 mg/dL — ABNORMAL HIGH (ref 65–99)

## 2017-08-28 LAB — CBC
HCT: 29.4 % — ABNORMAL LOW (ref 36.0–46.0)
Hemoglobin: 9.3 g/dL — ABNORMAL LOW (ref 12.0–15.0)
MCH: 30.1 pg (ref 26.0–34.0)
MCHC: 31.6 g/dL (ref 30.0–36.0)
MCV: 95.1 fL (ref 78.0–100.0)
PLATELETS: 374 10*3/uL (ref 150–400)
RBC: 3.09 MIL/uL — ABNORMAL LOW (ref 3.87–5.11)
RDW: 15.6 % — AB (ref 11.5–15.5)
WBC: 12.1 10*3/uL — AB (ref 4.0–10.5)

## 2017-08-28 LAB — COMPREHENSIVE METABOLIC PANEL
ALT: 25 U/L (ref 14–54)
AST: 36 U/L (ref 15–41)
Albumin: 1.6 g/dL — ABNORMAL LOW (ref 3.5–5.0)
Alkaline Phosphatase: 71 U/L (ref 38–126)
Anion gap: 11 (ref 5–15)
BUN: 33 mg/dL — AB (ref 6–20)
CO2: 18 mmol/L — ABNORMAL LOW (ref 22–32)
CREATININE: 0.93 mg/dL (ref 0.44–1.00)
Calcium: 8.5 mg/dL — ABNORMAL LOW (ref 8.9–10.3)
Chloride: 110 mmol/L (ref 101–111)
GFR calc Af Amer: >60 mL/min (ref >60)
Glucose, Bld: 215 mg/dL — ABNORMAL HIGH (ref 65–99)
Potassium: 3.6 mmol/L (ref 3.5–5.1)
Sodium: 139 mmol/L (ref 135–145)
TOTAL PROTEIN: 6.5 g/dL (ref 6.5–8.1)
Total Bilirubin: 0.3 mg/dL (ref 0.3–1.2)

## 2017-08-28 LAB — PHENYTOIN LEVEL, TOTAL: Phenytoin Lvl: 7.2 ug/mL — ABNORMAL LOW (ref 10.0–20.0)

## 2017-08-28 MED ORDER — CLONIDINE ORAL SUSPENSION 10 MCG/ML: 0.1000 mg | Freq: Two times a day (BID) | ORAL | Status: DC

## 2017-08-28 MED ORDER — CLONIDINE HCL 0.1 MG PO TABS
0.1000 mg | ORAL_TABLET | Freq: Two times a day (BID) | ORAL | Status: DC
  Administered 2017-08-28 – 2017-08-29 (×3): 0.1 mg
  Filled 2017-08-28 (×3): qty 1

## 2017-08-28 MED ORDER — COLLAGENASE 250 UNIT/GM EX OINT
TOPICAL_OINTMENT | Freq: Every day | CUTANEOUS | Status: AC
  Administered 2017-08-28: 13:00:00 via TOPICAL
  Administered 2017-08-29: 1 via TOPICAL
  Administered 2017-08-30 – 2017-09-05 (×7): via TOPICAL
  Administered 2017-09-06: 1 via TOPICAL
  Filled 2017-08-28: qty 30

## 2017-08-28 MED ORDER — ACETYLCYSTEINE 20 % IN SOLN
3.0000 mL | Freq: Three times a day (TID) | RESPIRATORY_TRACT | Status: DC
  Administered 2017-08-28: 20:00:00 via RESPIRATORY_TRACT
  Administered 2017-08-29: 3 mL via RESPIRATORY_TRACT
  Filled 2017-08-28 (×2): qty 4

## 2017-08-28 MED ORDER — INSULIN GLARGINE 100 UNIT/ML ~~LOC~~ SOLN
30.0000 [IU] | Freq: Two times a day (BID) | SUBCUTANEOUS | Status: DC
  Administered 2017-08-28 – 2017-09-02 (×10): 30 [IU] via SUBCUTANEOUS
  Filled 2017-08-28 (×11): qty 0.3

## 2017-08-28 NOTE — Progress Notes (Signed)
MEDICATION RELATED CONSULT NOTE - Follow-up  Pharmacy Consult for phenytoin Indication: Seizures  No Known Allergies  Patient Measurements: Height: 5\' 5"  (165.1 cm) Weight: 192 lb 0.3 oz (87.1 kg) IBW/kg (Calculated) : 57  Vital Signs: Temp: 99.5 F (37.5 C) (02/19 0405) Temp Source: Oral (02/19 0405) BP: 182/84 (02/19 7972) Pulse Rate: 92 (02/19 0608) Intake/Output from previous day: 02/18 0701 - 02/19 0700 In: 1920 [I.V.:240; NG/GT:1360; IV Piggyback:200] Out: 1950 [Urine:1850; Stool:100] Intake/Output from this shift: No intake/output data recorded.  Labs: Recent Labs    08/26/17 0513 08/27/17 0439 08/28/17 0355  WBC  --   --  12.1*  HGB  --   --  9.3*  HCT  --   --  29.4*  PLT  --   --  374  CREATININE 0.86 0.95 0.93  ALBUMIN  --  1.6* 1.6*  PROT  --  6.7 6.5  AST  --  34 36  ALT  --  25 25  ALKPHOS  --  73 71  BILITOT  --  0.4 0.3   Estimated Creatinine Clearance: 70.1 mL/min (by C-G formula based on SCr of 0.93 mg/dL).  Assessment: Patient with seizures and has had multiple dose adjustments of phenytoin to get therapeutic lvls  Now trached  Phenytoin lvl this am 7.2, alb 1.6 - this corrects to 13.3 which is therapeutic  Plan:  Continue pheyntoin susp 250 mg q6h Check lvls prn  Elenor Quinones, PharmD, [facility] Clinical Pharmacist Pager [phone] 08/28/2017 7:44 AM

## 2017-08-28 NOTE — Progress Notes (Signed)
Pt taken off vent and placed on trach collar around 1930. Pt had increased secretions, increased respirations and work of breathing. Respiratory called and pt placed back on vent around 2005. Will continue to monitor.

## 2017-08-28 NOTE — Progress Notes (Signed)
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 She was found down the day of this admit in a puddle of stool and urine, and brought to the ED where she had overt clinical seizure activity.She was treated with Keppra fosphenytoin and Ativan resulting in arrest of the seizures.    Significant Events: 1/24 admit - intubated 1/25 TTE - EF 55-60% - grade 2 DD 2/6 tracheostomy  2/8 PEG tube in IR  2/11 TR[facility] assumed care   Subjective: Non-communicative.  No signs of acute distress.     Assessment & Plan:  Seizure - refractory status epilepticus  Due to non-compliance w/ seizure meds, hyperosmolar state, and cocaine use - potentially seizing for days upon arrival (had not been seen for 2 days prior to being found) - Neuro suggests no attempts to wean AEDs for at least 2 months - signif injury to brain thought to have occurred - no clinical evidence of any improvement at this time - remains fully non-communicative   Pseudomonas pneumonia + tracheobronchitis  Completed an initial course of treatment - had been afebrile, then developed increased secretions and low grade fever again - f/u tracheal aspirate confirmed pan-sensitive Pseudomonas - resumed abx 2/17 for a planned extended tx course   Acute hypoxic respiratory failure Vent dependent - s/p trach - vent per PCCM - to give trials at weaning from vent per note 2/18  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 in setting of HTN - refused SNF placement at that time  Chronic diastolic CHF low grade anasarca persists, due to albumin of 1.3 - cont diuretic and follow - wgt stable   Filed Weights   08/26/17 0359 08/27/17 0355 08/28/17 0605  Weight: 84 kg (185 lb 3 oz) 87.1 kg  (192 lb 0.3 oz) 87.1 kg (192 lb 0.3 oz)    DM CBG slowly increasing - adjust tx again today and follow   HTN  BP quite variable - suspect tracheal secretions and agitation on vent are complicating issue - gently adjust tx and follow   Hypernatremia  Corrected   Hypokalemia Cont to supplement and follow to goal of 4.0  Normocytic Anemia No evidence of acute blood loss - Hgb stable   DVT prophylaxis: SCDs Code Status: FULL CODE Family Communication: no family present at time of exam  Disposition Plan: will need a vent capable SNF - placement proving to be difficult - PCCM working on weaning off vent   Consultants:  Neurology PCCM  Antimicrobials:  Vancomycin 1/30 > 1/31 Zosyn 1/30 > 2/5 Cipro 2/17 >  Objective: Blood pressure (!) 142/71, pulse 89, temperature 99.6 F (37.6 C), temperature source Oral, resp. rate (!) 30, height '5\' 5"'$  (1.651 m), weight 87.1 kg (192 lb 0.3 oz), SpO2 100 %.  Intake/Output Summary (Last 24 hours) at 08/28/2017 0951 Last data filed at 08/28/2017 0744 Gross per 24 hour  Intake 1990 ml  Output 2200 ml  Net -210 ml   Filed Weights   08/26/17 0359 08/27/17 0355 08/28/17 0605  Weight: 84 kg (185 lb 3 oz) 87.1 kg (192 lb 0.3 oz) 87.1 kg (192 lb 0.3 oz)    Examination: General: No acute respiratory distress on vent via trach  Lungs: mild upper  airway sounds transmitted th/o - no focal crackles - no wheezing  Cardiovascular: RRR - no M  Abdomen: NT/ND, soft, BS+, no rebound - PEG insertion clean and dry  Extremities: 1+ anasarca w/o change   CBC: Recent Labs  Lab 08/22/17 0349 08/23/17 0410 08/24/17 0349 08/25/17 0431 08/28/17 0355  WBC 12.5* 10.9* 10.5 10.7* 12.1*  HGB 8.9* 8.9* 9.4* 10.1* 9.3*  HCT 28.7* 28.8* 29.6* 32.7* 29.4*  MCV 98.3 97.0 96.1 95.9 95.1  PLT 536* 520* 535* 492* 026   Basic Metabolic Panel: Recent Labs  Lab 08/23/17 0410 08/24/17 0349 08/25/17 0431 08/26/17 0513 08/27/17 0439 08/28/17 0355  NA 141 140  140 138 137 139  K 3.3* 3.6 3.7 3.6 3.5 3.6  CL 114* 113* 111 110 107 110  CO2 17* 17* 17* 18* 18* 18*  GLUCOSE 203* 192* 158* 259* 266* 215*  BUN 27* 27* 27* 27* 28* 33*  CREATININE 0.99 0.92 0.84 0.86 0.95 0.93  CALCIUM 8.2* 8.5* 8.9 8.6* 8.6* 8.5*  MG 1.6* 1.9 1.8  --   --   --    GFR: Estimated Creatinine Clearance: 70.1 mL/min (by C-G formula based on SCr of 0.93 mg/dL).  Liver Function Tests: Recent Labs  Lab 08/23/17 0410 08/27/17 0439 08/28/17 0355  AST  --  34 36  ALT  --  25 25  ALKPHOS  --  73 71  BILITOT  --  0.4 0.3  PROT  --  6.7 6.5  ALBUMIN 1.3* 1.6* 1.6*    HbA1C: Hgb A1c MFr Bld  Date/Time Value Ref Range Status  05/26/2017 10:35 PM 14.6 (H) 4.8 - 5.6 % Final    Comment:    (NOTE) Pre diabetes:          5.7%-6.4% Diabetes:              >6.4% Glycemic control for   <7.0% adults with diabetes   05/11/2017 08:08 AM 13.2 (H) 4.8 - 5.6 % Final    Comment:    (NOTE) Pre diabetes:          5.7%-6.4% Diabetes:              >6.4% Glycemic control for   <7.0% adults with diabetes     CBG: Recent Labs  Lab 08/27/17 1554 08/27/17 2012 08/27/17 2340 08/28/17 0346 08/28/17 0743  GLUCAP 149* 166* 163* 186* 213*    Scheduled Meds: . amLODipine  10 mg Per Tube Daily  . aspirin  81 mg Per Tube Daily  . chlorhexidine gluconate (MEDLINE KIT)  15 mL Mouth Rinse BID  . Chlorhexidine Gluconate Cloth  6 each Topical Daily  . enalapril  20 mg Per Tube Daily  . feeding supplement (PRO-STAT SUGAR FREE 64)  30 mL Per Tube BID  . feeding supplement (VITAL HIGH PROTEIN)  1,000 mL Per Tube Q24H  . free water  200 mL Per Tube Q8H  . furosemide  40 mg Per Tube BID  . heparin  5,000 Units Subcutaneous Q8H  . hydrALAZINE  100 mg Per Tube Q8H  . insulin aspart  0-20 Units Subcutaneous Q4H  . insulin glargine  28 Units Subcutaneous BID  . labetalol  400 mg Per Tube TID  . lacosamide  200 mg Per Tube BID  . levETIRAcetam  1,000 mg Per Tube BID  . mouth rinse   15 mL Mouth Rinse 10 times per day  . mupirocin cream   Topical Daily  . pantoprazole sodium  40 mg  Per Tube QHS  . phenytoin  250 mg Per Tube Q6H  . potassium chloride  50 mEq Per Tube Daily  . thiamine  100 mg Per Tube Daily  . topiramate  200 mg Per Tube BID     LOS: 26 days   Cherene Altes, MD [facility] Office  [phone] Pager - Text Page per Amion as per below:  On-Call/Text Page:      [email or web address]      password [email or web address]  If 7PM-7AM, please contact night-coverage [email or web address] Password [facility] 08/28/2017, 9:51 AM

## 2017-08-28 NOTE — Progress Notes (Signed)
Nutrition Follow-up  DOCUMENTATION CODES:   Obesity unspecified  INTERVENTION:    Continue Vital High Protein at 40 ml/h via PEG  Pro-stat 30 ml BID via PEG  Provides 1160 kcal, 114 gm protein, 803 ml free water daily  NUTRITION DIAGNOSIS:   Inadequate oral intake related to inability to eat as evidenced by NPO status, ongoing  GOAL:   Patient will meet greater than or equal to 90% of their needs, met  MONITOR:   Vent status, TF tolerance, Labs, Skin, Weight trends, I & O's  ASSESSMENT:   62 yo female with PMH of DM, HTN, alcohol and cocaine abuse, L thalamic hemorrhage, insomnia disorder who was admitted on 1/24 after being found down; clinical seizures in the ED; required intubation in ED.  2/6 s/p trach 2/8 s/p PEG tube placement  Patient is currently on ventilator support Temp (24hrs), Avg:99.6 F (37.6 C), Min:99.4 F (37.4 C), Max:100.2 F (37.9 C)  Vital High Protein currently infusing at goal rate of 40 ml/hr via PEG tube. Pt remains comatose and uncommunicative. PCCM following. PS trials ongoing. PMT note reviewed. Family not returning calls to discuss goals of care.  Free water flushes at 200 ml every 8 hours. Labs and medications reviewed.  CBG's [phone].  Diet Order:  Seizure precautions Diet NPO time specified  EDUCATION NEEDS:   No education needs have been identified at this time  Skin:  Skin Assessment: Skin Integrity Issues: Skin Integrity Issues:: Stage II, Unstageable, Diabetic Ulcer Stage II: MDRPI to L lateral neck Unstageable: sacrum; MDRPI adjacent to FMS Diabetic Ulcer: R leg   Last BM:  2/19 - rectal tube   Intake/Output Summary (Last 24 hours) at 08/28/2017 1318 Last data filed at 08/28/2017 1314 Gross per 24 hour  Intake 2720 ml  Output 2750 ml  Net -30 ml   Height:   Ht Readings from Last 1 Encounters:  08/02/17 '5\' 5"'$  (1.651 m)   Weight: >>> stable  Wt Readings from Last 1 Encounters:  08/28/17 192 lb 0.3 oz  (87.1 kg)   2/18  192 lb 2/17  185 lb 2/16  197 lb 2/15  200 lb 2/14  202 lb 2/13  201 lb 2/11  205 lb 2/10  194 lb 2/09  201 lb 2/08  202 lb  Ideal Body Weight:  56.8 kg  BMI:  Body mass index is 31.95 kg/m.  Estimated Nutritional Needs:   Kcal:  1000-1200  Protein:  95-110 gm  Fluid:  >/= 1.5 L  Arthur Holms, RD, LDN Pager #: [phone] After-Hours Pager #: [phone]

## 2017-08-28 NOTE — Consult Note (Signed)
[place] Nurse wound follow up Wound type: Pressure injury sacrum: updated to unstageable POA MDRPI: Stage 2 Pressure injury: left lateral neck MDRPI: Unstageable pressure injury: adjacent to FMS Measurement: Sacrum: 3cm x 0.3cm x 0.1cm;100% yellow with some scattered superficial areas from moisture Left lateral neck: 0.3cmx 0.5cm x 0.1cm; partial thickness some scattered yellow   Wound bed:see above  Drainage (amount, consistency, odor) minimal  Periwound: intact  Dressing procedure/placement/frequency: Add enzymatic debridement ointment to the sacral wound, top with moist dressing, change daily. Progress ICU bed in place for moisture management and pressure redistribution Monitor FMS and reposition every 2 hours  Foam in place for neck wound  WOC Nurse team will follow along with you for weekly wound assessments.  Please notify me of any acute changes in the wounds or any new areas of concerns [place] MSN, RN,CWOCN, [place], [place]

## 2017-08-28 NOTE — Progress Notes (Signed)
PULMONARY / CRITICAL CARE MEDICINE   Name: [name] MRN: [id number] DOB: 08/12/1955    ADMISSION DATE:  08/02/2017 CONSULTATION DATE:  08/02/2017  CHIEF COMPLAINT:  Found down  HISTORY OF PRESENT ILLNESS:    62 year old woman with a complicated hospital course following watershed strokes and ischemic encephalopathy. Admitted for seizure activity.  She remains comatose.  She is ventilated currently, has a tracheostomy in place. Attempting ATC   SUBJECTIVE:  Currently on full vent support after bloody secretions noted through the night 2/19  2/2 suctioning . Bleeding has currently stopped   VITAL SIGNS: BP (!) 164/82 (BP Location: Left Arm)   Pulse 91   Temp 99.8 F (37.7 C) (Oral)   Resp (!) 0   Ht 5\' 5"  (1.651 m)   Wt 192 lb 0.3 oz (87.1 kg)   SpO2 99%   BMI 31.95 kg/m   HEMODYNAMICS:    VENTILATOR SETTINGS: Vent Mode: PRVC FiO2 (%):  [30 %-40 %] 40 % Set Rate:  [24 bmp] 24 bmp Vt Set:  [500 mL] 500 mL PEEP:  [5 cmH20] 5 cmH20 Plateau Pressure:  [19 cmH20] 19 cmH20  INTAKE / OUTPUT: I/O last 3 completed shifts: In: 2970 [I.V.:510; Other:120; NG/GT:1940; IV Piggyback:400] Out: 3750 [Urine:3350; Stool:400]  PHYSICAL EXAMINATION:  General: 62 year old female NAD on vent , bloody secretions per vent tubing,  HEENT: mm moist, #6 cuffed Shiley unremarkable, secure and intact, bloody secretions noted Pulmonary: resps even non labored on vent, few rhonchi noted, bloody secretions noted Cardiac: Regular rate and rhythm, S1, S2, No RMG Extremities/musculoskeletal: Generalized anasarca, generalized weakness Abdomen: soft, +bs, non-distended  Neuro: opens eyes, does not track, no response , dose not follow commands  LABS:  BMET Recent Labs  Lab 08/26/17 0513 08/27/17 0439 08/28/17 0355  NA 138 137 139  K 3.6 3.5 3.6  CL 110 107 110  CO2 18* 18* 18*  BUN 27* 28* 33*  CREATININE 0.86 0.95 0.93  GLUCOSE 259* 266* 215*   Electrolytes Recent Labs  Lab  08/23/17 0410 08/24/17 0349 08/25/17 0431 08/26/17 0513 08/27/17 0439 08/28/17 0355  CALCIUM 8.2* 8.5* 8.9 8.6* 8.6* 8.5*  MG 1.6* 1.9 1.8  --   --   --    CBC Recent Labs  Lab 08/24/17 0349 08/25/17 0431 08/28/17 0355  WBC 10.5 10.7* 12.1*  HGB 9.4* 10.1* 9.3*  HCT 29.6* 32.7* 29.4*  PLT 535* 492* 374   Coag's No results for input(s): APTT, INR in the last 168 hours. Sepsis Markers No results for input(s): LATICACIDVEN, PROCALCITON, O2SATVEN in the last 168 hours.  ABG No results for input(s): PHART, PCO2ART, PO2ART in the last 168 hours.  Liver Enzymes Recent Labs  Lab 08/23/17 0410 08/27/17 0439 08/28/17 0355  AST  --  34 36  ALT  --  25 25  ALKPHOS  --  73 71  BILITOT  --  0.4 0.3  ALBUMIN 1.3* 1.6* 1.6*   Cardiac Enzymes No results for input(s): TROPONINI, PROBNP in the last 168 hours.  Glucose Recent Labs  Lab 08/27/17 1554 08/27/17 2012 08/27/17 2340 08/28/17 0346 08/28/17 0743 08/28/17 1119  GLUCAP 149* 166* 163* 186* 213* 252*   Imaging No results found. STUDIES:  EEG 1/24>>>This is an abnormal EEG.There is evidence of moderate generalized slowing of brain activity as well as severe focal slowing in the left temporal lobe. The patient is not in non-convulsive status epilepticus. 2D echo 1/25>>> EF 76-22%, grade 2 diastolic dysfunction  MRI brain 1/25>>> 1.  No acute intracranial abnormality. 2. 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 3. Remote hemorrhage in the left thalamus and left pons. 4. Age advanced atrophy. 5. Fluid in the nasopharynx and paranasal sinuses is likely secondary to intubation.  CULTURES: Blood Cx 1/30>>> Respiratory 1/30>>>Psuedomonas  2/15 sputum>>> pseudomonas   ANTIBIOTICS: Vancomycin 1/30>>> 1/31 Zosyn 1/30>>> 2/5  LINES/TUBES: CVC 1/24>>>2/6 ETT 1/24>>>2/6 NG/OG 1/24>>> Tracheostomy 2/6>> PICC 2/6 >>   ASSESSMENT / PLAN:  Acute respiratory failure with  hypoxemia-Improving Tracheostomy status HTN Anemia, no bleeding  Pseudomonas Pneumonia  Hyperglycemia  Non-anion gap metabolic acidosis suspect due to hyperchloremia Watershed infarct/ischemic encephalopathy Acute encephalopathy Status epilepticus   Pulmonary problem 62 year old female who is tracheostomy and ventilator dependent setting of ischemic encephalopathy Course has been complicated by refractory status epilepticus, Pseudomonas pneumonia, and volume overload.  We have not been able to liberate her from the ventilator, she is not a candidate for decannulation. Will try ATC. If she can tolerate and stay ventilator free then this would widen her opportunities for transfer out of the hospital.  If unable then we will need to continue search for ventilator SNF. Bleeding noted 2/2 frequent suctioning   Plan/rec Follow-up chest x-ray intermittently  Diuresis as tol  Continue attempts at PS/ ATC wean but has not tolerated - highly doubt she will be completely liberated from vent  Continue search for vent SNF  Palliative care following  Minimize suctioning Monitor CBC Observe for active bleeding from trach  PCCM will continue to follow intermittently for trach/vent.   Magdalen Spatz, AGACNP-BC 08/28/2017  11:41 AM Pager:  [phone]

## 2017-08-29 ENCOUNTER — Inpatient Hospital Stay (HOSPITAL_COMMUNITY): Payer: Medicaid Other

## 2017-08-29 DIAGNOSIS — R2233 Localized swelling, mass and lump, upper limb, bilateral: Secondary | ICD-10-CM

## 2017-08-29 DIAGNOSIS — I6389 Other cerebral infarction: Secondary | ICD-10-CM

## 2017-08-29 LAB — GLUCOSE, CAPILLARY
GLUCOSE-CAPILLARY: 126 mg/dL — AB (ref 65–99)
GLUCOSE-CAPILLARY: 184 mg/dL — AB (ref 65–99)
Glucose-Capillary: 209 mg/dL — ABNORMAL HIGH (ref 65–99)
Glucose-Capillary: 215 mg/dL — ABNORMAL HIGH (ref 65–99)
Glucose-Capillary: 231 mg/dL — ABNORMAL HIGH (ref 65–99)
Glucose-Capillary: 253 mg/dL — ABNORMAL HIGH (ref 65–99)

## 2017-08-29 LAB — BASIC METABOLIC PANEL
ANION GAP: 11 (ref 5–15)
BUN: 35 mg/dL — ABNORMAL HIGH (ref 6–20)
CALCIUM: 8.6 mg/dL — AB (ref 8.9–10.3)
CO2: 19 mmol/L — AB (ref 22–32)
CREATININE: 0.93 mg/dL (ref 0.44–1.00)
Chloride: 108 mmol/L (ref 101–111)
GLUCOSE: 142 mg/dL — AB (ref 65–99)
Potassium: 3.5 mmol/L (ref 3.5–5.1)
Sodium: 138 mmol/L (ref 135–145)

## 2017-08-29 LAB — CBC
HCT: 30 % — ABNORMAL LOW (ref 36.0–46.0)
HEMOGLOBIN: 9.7 g/dL — AB (ref 12.0–15.0)
MCH: 30.6 pg (ref 26.0–34.0)
MCHC: 32.3 g/dL (ref 30.0–36.0)
MCV: 94.6 fL (ref 78.0–100.0)
PLATELETS: 386 10*3/uL (ref 150–400)
RBC: 3.17 MIL/uL — ABNORMAL LOW (ref 3.87–5.11)
RDW: 15.6 % — ABNORMAL HIGH (ref 11.5–15.5)
WBC: 12 10*3/uL — ABNORMAL HIGH (ref 4.0–10.5)

## 2017-08-29 MED ORDER — ENALAPRIL MALEATE 20 MG PO TABS
30.0000 mg | ORAL_TABLET | Freq: Every day | ORAL | Status: DC
  Administered 2017-08-30 – 2017-09-14 (×16): 30 mg
  Filled 2017-08-29 (×16): qty 1

## 2017-08-29 MED ORDER — POTASSIUM CHLORIDE 20 MEQ/15ML (10%) PO SOLN
20.0000 meq | ORAL | Status: AC
  Administered 2017-08-29 (×2): 20 meq
  Filled 2017-08-29 (×2): qty 15

## 2017-08-29 MED ORDER — CLONIDINE HCL 0.2 MG PO TABS
0.2000 mg | ORAL_TABLET | Freq: Three times a day (TID) | ORAL | Status: DC
  Administered 2017-08-29 – 2017-09-14 (×49): 0.2 mg
  Filled 2017-08-29 (×51): qty 1

## 2017-08-29 MED ORDER — ACETYLCYSTEINE 20 % IN SOLN
3.0000 mL | Freq: Two times a day (BID) | RESPIRATORY_TRACT | Status: DC
  Administered 2017-08-29 – 2017-08-30 (×2): 3 mL via RESPIRATORY_TRACT
  Filled 2017-08-29 (×2): qty 4

## 2017-08-29 NOTE — Progress Notes (Signed)
[facility] ADULT ICU REPLACEMENT PROTOCOL FOR AM LAB REPLACEMENT ONLY  The patient does apply for the [facility] Adult ICU Electrolyte Replacment Protocol based on the criteria listed below:   1. Is GFR >/= 40 ml/min? Yes.    Patient's GFR today is >60 2. Is urine output >/= 0.5 ml/kg/hr for the last 6 hours? Yes.   Patient's UOP is 2.4 ml/kg/hr 3. Is BUN < 60 mg/dL? Yes.    Patient's BUN today is 35 4. Abnormal electrolyte(s): k-3.5 5. Ordered repletion with: per protocol 6. If a panic level lab has been reported, has the CCM MD in charge been notified? Yes.  .   Physician:  Dr. Durward Parcel, Philis Nettle 08/29/2017 5:31 AM

## 2017-08-29 NOTE — Progress Notes (Signed)
PROGRESS NOTE    [name]  EUM:[id number] DOB: 1955/09/29 DOA: 08/02/2017 PCP: Nolene Ebbs, MD   Brief Narrative:  62 year old BF PMHx polysubstance abuse (EtOH, Cocaine)Diabetes mellitus, HTN, Insomnia disorder, Shortness of breath.   Previously admitted to this hospital for a left thalamic hemorrhage.  She was found down today in a puddle of stool and urine.  She brought to our department emergency medicine where she had overt clinical seizure activity.  She was treated with Keppra fosphenytoin and Ativan in the clinical seizures have subsided.  EEG does not show ongoing seizure activity but shows decreased activity throughout the left hemisphere suggesting a postictal state.  She is unable to give any history and remains obtunded at this time.  Imaging studies did not show a new structural lesion.  She was coughing up purulent material in the department of emergency medicine.      Subjective: 2/20 eyes open, cries/response to painful stimuli such as opening her hands.     Assessment & Plan:   Active Problems:   Uncontrolled hypertension   Cerebrovascular accident (CVA) ([place])   Status epilepticus ([place])   Altered mental status   Cocaine abuse ([place])   Pressure injury of skin   Acute respiratory failure with hypoxemia ([facility])   Metabolic acidosis   Tracheostomy status ([facility])   Polysubstance abuse (EtOH, Cocaine)   CVA/[place] infarct/Acute encephalopath -Patient now obtunded secondary to polysubstance abuse, noncompliance---> refractory seizures and CVA. -Extremely poor prognosis, secondary to significant brain injury.  Seizure -Per neurology no attempts to wean AEDs for at least 2 months  Positive pseudomonas pneumonia -Completed course of treatment  Acute respiratory failure with hypoxemia -Vent dependent -Daily WUA/SBT   Chronic diastolic CHF -Amlodipine 10 mg daily -Clonidine 0.2 mg TID -Increase Enalapril 30 mg daily  -Hydralazine 50 mg  TID  Essential HTN  -see CHF    Hypernatremia  -Free water 400 ml q 4 hr    Hypokalemia -Potassium goal> 4 -Potassium 50 mEq  Hypomagnesemia -Magnesium goal> 2  Anemia,       Goals of care  -CSW currently working on placement after discussing with daughter options. Family would like patient to be placed close to family in the [facility] area.   -2/13 PALLIATIVE CARE: Consult placed polysubstance abuse (EtOH, Cocaine), hemorrhagic stroke, severe brain injury, continues to require full vent support. Obtunded CODE STATUS change, hospice -2/14 palliative care attempted to contact family unsuccessfully. Given patient only has Medicaid of [place] very poor likelihood that we will be able to transfer patient to [place]. Spoke with Wynetta Emery from [facility] will not except patient in LTAC or vent SNF.    DVT prophylaxis: SCD Code Status: Full Family Communication: None Disposition Plan: TBD   Consultants:  Neurology [facility]    Procedures/Significant Events:  EEG 1/24>>>This is an abnormal EEG.  There is evidence of moderate generalized slowing of brain activity as well as severe focal slowing in the left temporal lobe.  The patient is not in non-convulsive status epilepticus. 2D echo 1/25>>> EF 54-00%, grade 2 diastolic dysfunction  MRI brain 1/25>>> 1. No acute intracranial abnormality. 2. 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 3. Remote hemorrhage in the left thalamus and left pons. 4. Age advanced atrophy. 5. Fluid in the nasopharynx and paranasal sinuses is likely secondary to intubation.    I have personally reviewed and interpreted all radiology studies and my findings are as above.  VENTILATOR SETTINGS: PRVC Vt set: 576m Set rate: 24 FiO2:  30% PEEP; 5 cm H2O    Cultures 1/30 Blood Cx negative 1/30 Respiratory positive Psuedomonas Aeruginosa    Antimicrobials: Anti-infectives (From admission, onward)   Start     Stop   08/17/17  1145  ceFAZolin (ANCEF) IVPB 1 g/50 mL premix     08/17/17 1144   08/17/17 1120  ceFAZolin (ANCEF) powder 1 g  Status:  Discontinued     08/17/17 1141   08/17/17 1119  ceFAZolin (ANCEF) IVPB 2g/100 mL premix  Status:  Discontinued     08/17/17 1132   08/17/17 1057  ceFAZolin (ANCEF) 2-4 GM/100ML-% IVPB    Comments:  Camelia Phenes   : cabinet override   08/17/17 1131   08/17/17 0600  ceFAZolin (ANCEF) IVPB 2g/100 mL premix     08/17/17 0553   08/16/17 1530  ceFAZolin (ANCEF) IVPB 2g/100 mL premix  Status:  Discontinued     08/16/17 1526   08/08/17 2300  vancomycin (VANCOCIN) IVPB 750 mg/150 ml premix  Status:  Discontinued     08/10/17 0923   08/08/17 1400  piperacillin-tazobactam (ZOSYN) IVPB 3.375 g  Status:  Discontinued     08/14/17 0745   08/08/17 1100  vancomycin (VANCOCIN) 2,000 mg in sodium chloride 0.9 % 500 mL IVPB     08/08/17 1253       Devices    LINES / TUBES:  CVC 1/24>>>2/6 ETT 1/24>>>2/6 NG/OG 1/24>>> Tracheostomy 2/6>> PICC 2/6 >>     Continuous Infusions: . sodium chloride 10 mL/hr at 08/29/17 0405  . ciprofloxacin Stopped (08/29/17 0501)     Objective: Vitals:   08/29/17 0700 08/29/17 0745 08/29/17 0800 08/29/17 0815  BP: 123/76  130/71   Pulse: 91 91 91   Resp: (!) 24 17 (!) 24   Temp:    99.3 F (37.4 C)  TempSrc:    Oral  SpO2: 100% 100% 100%   Weight:      Height:        Intake/Output Summary (Last 24 hours) at 08/29/2017 0857 Last data filed at 08/29/2017 2353 Gross per 24 hour  Intake 2377 ml  Output 2765 ml  Net -388 ml   Filed Weights   08/27/17 0355 08/28/17 0605 08/29/17 0419  Weight: 192 lb 0.3 oz (87.1 kg) 192 lb 0.3 oz (87.1 kg) 181 lb 7 oz (82.3 kg)    Physical Exam:  General: Obtunded, acute respiratory distress Eyes: eyes open spontaneously positive anisocoria, Neck:  Negative scars, masses, torticollis, lymphadenopathy, JVD, #6 cuffed trach in place are clean negative sign of infection Lungs: Clear to  auscultation bilaterally without wheezes or crackles Cardiovascular: Regular rate and rhythm without murmur gallop or rub normal S1 and S2 Abdomen: negative abdominal pain, nondistended, positive soft, bowel sounds, no rebound, no ascites, no appreciable mass, PEG tube in place negative sign of infection Extremities: No significant cyanosis, clubbing, or edema bilateral lower extremities Skin: Negative rashes, lesions, ulcers Psychiatric:  Unable to assess secondary to CVA Central nervous system:  Unable to assess secondary CVA     .     Data Reviewed: Care during the described time interval was provided by me .  I have reviewed this patient's available data, including medical history, events of note, physical examination, and all test results as part of my evaluation.   CBC: Recent Labs  Lab 08/23/17 0410 08/24/17 0349 08/25/17 0431 08/28/17 0355 08/29/17 0309  WBC 10.9* 10.5 10.7* 12.1* 12.0*  HGB 8.9* 9.4* 10.1* 9.3* 9.7*  HCT 28.8* 29.6*  32.7* 29.4* 30.0*  MCV 97.0 96.1 95.9 95.1 94.6  PLT 520* 535* 492* 374 073   Basic Metabolic Panel: Recent Labs  Lab 08/23/17 0410 08/24/17 0349 08/25/17 0431 08/26/17 0513 08/27/17 0439 08/28/17 0355 08/29/17 0309  NA 141 140 140 138 137 139 138  K 3.3* 3.6 3.7 3.6 3.5 3.6 3.5  CL 114* 113* 111 110 107 110 108  CO2 17* 17* 17* 18* 18* 18* 19*  GLUCOSE 203* 192* 158* 259* 266* 215* 142*  BUN 27* 27* 27* 27* 28* 33* 35*  CREATININE 0.99 0.92 0.84 0.86 0.95 0.93 0.93  CALCIUM 8.2* 8.5* 8.9 8.6* 8.6* 8.5* 8.6*  MG 1.6* 1.9 1.8  --   --   --   --    GFR: Estimated Creatinine Clearance: 68.1 mL/min (by C-G formula based on SCr of 0.93 mg/dL). Liver Function Tests: Recent Labs  Lab 08/23/17 0410 08/27/17 0439 08/28/17 0355  AST  --  34 36  ALT  --  25 25  ALKPHOS  --  73 71  BILITOT  --  0.4 0.3  PROT  --  6.7 6.5  ALBUMIN 1.3* 1.6* 1.6*   No results for input(s): LIPASE, AMYLASE in the last 168 hours. No results for  input(s): AMMONIA in the last 168 hours. Coagulation Profile: No results for input(s): INR, PROTIME in the last 168 hours. Cardiac Enzymes: No results for input(s): CKTOTAL, CKMB, CKMBINDEX, TROPONINI in the last 168 hours. BNP (last 3 results) No results for input(s): PROBNP in the last 8760 hours. HbA1C: No results for input(s): HGBA1C in the last 72 hours. CBG: Recent Labs  Lab 08/28/17 0743 08/28/17 1119 08/28/17 1611 08/28/17 1944 08/28/17 2337  GLUCAP 213* 252* 196* 252* 211*   Lipid Profile: No results for input(s): CHOL, HDL, LDLCALC, TRIG, CHOLHDL, LDLDIRECT in the last 72 hours. Thyroid Function Tests: No results for input(s): TSH, T4TOTAL, FREET4, T3FREE, THYROIDAB in the last 72 hours. Anemia Panel: No results for input(s): VITAMINB12, FOLATE, FERRITIN, TIBC, IRON, RETICCTPCT in the last 72 hours. Urine analysis:    Component Value Date/Time   COLORURINE YELLOW 08/05/2017 1216   APPEARANCEUR HAZY (A) 08/05/2017 1216   LABSPEC 1.017 08/05/2017 1216   PHURINE 5.0 08/05/2017 1216   GLUCOSEU NEGATIVE 08/05/2017 1216   GLUCOSEU > 1000 mg/dL (A) 11/01/2007 2056   HGBUR NEGATIVE 08/05/2017 1216   HGBUR negative 07/15/2007 0954   BILIRUBINUR NEGATIVE 08/05/2017 1216   KETONESUR NEGATIVE 08/05/2017 1216   PROTEINUR 30 (A) 08/05/2017 1216   UROBILINOGEN 1.0 04/09/2012 1524   NITRITE NEGATIVE 08/05/2017 1216   LEUKOCYTESUR TRACE (A) 08/05/2017 1216   Sepsis Labs: '@LABRCNTIP'$ (procalcitonin:4,lacticidven:4)  ) Recent Results (from the past 240 hour(s))  Culture, respiratory (NON-Expectorated)     Status: None   Collection Time: 08/24/17 10:19 AM  Result Value Ref Range Status   Specimen Description TRACHEAL ASPIRATE  Final   Special Requests NONE  Final   Gram Stain   Final    RARE WBC PRESENT, PREDOMINANTLY MONONUCLEAR RARE SQUAMOUS EPITHELIAL CELLS PRESENT FEW GRAM NEGATIVE RODS Performed at [facility], [place] [address] [place], [place] [address]     Culture MODERATE PSEUDOMONAS AERUGINOSA  Final   Report Status 08/26/2017 FINAL  Final   Organism ID, Bacteria PSEUDOMONAS AERUGINOSA  Final      Susceptibility   Pseudomonas aeruginosa - MIC*    CEFTAZIDIME 4 SENSITIVE Sensitive     CIPROFLOXACIN <=0.25 SENSITIVE Sensitive     GENTAMICIN <=1 SENSITIVE Sensitive  IMIPENEM 2 SENSITIVE Sensitive     PIP/TAZO 8 SENSITIVE Sensitive     CEFEPIME <=1 SENSITIVE Sensitive     * MODERATE PSEUDOMONAS AERUGINOSA         Radiology Studies: Dg Chest Port 1 View  Result Date: 08/29/2017 CLINICAL DATA:  Respiratory failure EXAM: PORTABLE CHEST 1 VIEW COMPARISON:  08/24/2017 FINDINGS: Cardiac shadow is stable. Tracheostomy tube and right-sided PICC line are noted in satisfactory position. Bibasilar opacities are again seen slightly improved from the prior exam which may be related to better inspiratory effort. No sizable effusion is seen. No bony abnormality is noted. IMPRESSION: Improved bibasilar opacities likely related to an improved inspiratory effort. Electronically Signed   By: Inez Catalina M.D.   On: 08/29/2017 08:08        Scheduled Meds: . acetylcysteine  3 mL Nebulization TID  . amLODipine  10 mg Per Tube Daily  . aspirin  81 mg Per Tube Daily  . chlorhexidine gluconate (MEDLINE KIT)  15 mL Mouth Rinse BID  . Chlorhexidine Gluconate Cloth  6 each Topical Daily  . cloNIDine  0.1 mg Per Tube BID  . collagenase   Topical Daily  . enalapril  20 mg Per Tube Daily  . feeding supplement (PRO-STAT SUGAR FREE 64)  30 mL Per Tube BID  . feeding supplement (VITAL HIGH PROTEIN)  1,000 mL Per Tube Q24H  . free water  200 mL Per Tube Q8H  . furosemide  40 mg Per Tube BID  . heparin  5,000 Units Subcutaneous Q8H  . hydrALAZINE  100 mg Per Tube Q8H  . insulin aspart  0-20 Units Subcutaneous Q4H  . insulin glargine  30 Units Subcutaneous BID  . labetalol  400 mg Per Tube TID  . lacosamide  200 mg Per Tube BID  . levETIRAcetam  1,000 mg  Per Tube BID  . mouth rinse  15 mL Mouth Rinse 10 times per day  . mupirocin cream   Topical Daily  . pantoprazole sodium  40 mg Per Tube QHS  . phenytoin  250 mg Per Tube Q6H  . potassium chloride  20 mEq Per Tube Q4H  . potassium chloride  50 mEq Per Tube Daily  . thiamine  100 mg Per Tube Daily  . topiramate  200 mg Per Tube BID   Continuous Infusions: . sodium chloride 10 mL/hr at 08/29/17 0405  . ciprofloxacin Stopped (08/29/17 0501)     LOS: 27 days    Time spent: 40 minutes    Markisha Meding, Geraldo Docker, MD [facility] Pager [phone]   If 7PM-7AM, please contact night-coverage [email or web address] Password [facility] 08/29/2017, 8:57 AM

## 2017-08-30 ENCOUNTER — Encounter (HOSPITAL_COMMUNITY): Payer: Self-pay | Admitting: Internal Medicine

## 2017-08-30 ENCOUNTER — Inpatient Hospital Stay (HOSPITAL_COMMUNITY): Payer: Medicaid Other

## 2017-08-30 DIAGNOSIS — I5032 Chronic diastolic (congestive) heart failure: Secondary | ICD-10-CM

## 2017-08-30 DIAGNOSIS — I82621 Acute embolism and thrombosis of deep veins of right upper extremity: Secondary | ICD-10-CM

## 2017-08-30 DIAGNOSIS — M7989 Other specified soft tissue disorders: Secondary | ICD-10-CM

## 2017-08-30 HISTORY — DX: Acute embolism and thrombosis of deep veins of right upper extremity: I82.621

## 2017-08-30 HISTORY — DX: Chronic diastolic (congestive) heart failure: I50.32

## 2017-08-30 LAB — BASIC METABOLIC PANEL
Anion gap: 12 (ref 5–15)
BUN: 45 mg/dL — ABNORMAL HIGH (ref 6–20)
CALCIUM: 8.7 mg/dL — AB (ref 8.9–10.3)
CO2: 18 mmol/L — AB (ref 22–32)
CREATININE: 1.2 mg/dL — AB (ref 0.44–1.00)
Chloride: 107 mmol/L (ref 101–111)
GFR calc Af Amer: 56 mL/min — ABNORMAL LOW (ref >60)
GFR calc non Af Amer: 48 mL/min — ABNORMAL LOW (ref >60)
GLUCOSE: 247 mg/dL — AB (ref 65–99)
Potassium: 3.8 mmol/L (ref 3.5–5.1)
Sodium: 137 mmol/L (ref 135–145)

## 2017-08-30 LAB — GLUCOSE, CAPILLARY
GLUCOSE-CAPILLARY: 220 mg/dL — AB (ref 65–99)
GLUCOSE-CAPILLARY: 177 mg/dL — AB (ref 65–99)
GLUCOSE-CAPILLARY: 228 mg/dL — AB (ref 65–99)
GLUCOSE-CAPILLARY: 224 mg/dL — AB (ref 65–99)
Glucose-Capillary: 227 mg/dL — ABNORMAL HIGH (ref 65–99)
Glucose-Capillary: 231 mg/dL — ABNORMAL HIGH (ref 65–99)

## 2017-08-30 MED ORDER — ACETYLCYSTEINE 20 % IN SOLN: 4.0000 mL | Freq: Two times a day (BID) | RESPIRATORY_TRACT | Status: DC

## 2017-08-30 MED ORDER — ACETYLCYSTEINE 20 % IN SOLN
4.0000 mL | Freq: Two times a day (BID) | RESPIRATORY_TRACT | Status: DC
  Administered 2017-08-30: 4 mL via RESPIRATORY_TRACT
  Filled 2017-08-30: qty 4

## 2017-08-30 MED ORDER — HEPARIN (PORCINE) IN NACL 100-0.45 UNIT/ML-% IJ SOLN
1100.0000 [IU]/h | INTRAMUSCULAR | Status: DC
  Administered 2017-08-30: 1050 [IU]/h via INTRAVENOUS
  Administered 2017-08-31: 1100 [IU]/h via INTRAVENOUS
  Filled 2017-08-30 (×2): qty 250

## 2017-08-30 NOTE — Progress Notes (Signed)
Orthopedic Tech Progress Note Patient Details:  [name] 1956/05/22 [id number]  Ortho Devices Type of Ortho Device: Prafo boot/shoe Ortho Device/Splint Location: bilateral Ortho Device/Splint Interventions: Application   Post Interventions Patient Tolerated: Well Instructions Provided: Care of device   [name] 08/30/2017, 2:41 PM

## 2017-08-30 NOTE — Progress Notes (Signed)
PROGRESS NOTE    [name]  BBC:[id number] DOB: 03-Feb-1956 DOA: 08/02/2017 PCP: Nolene Ebbs, MD   Brief Narrative:  62 year old BF PMHx polysubstance abuse (EtOH, Cocaine)Diabetes mellitus, HTN, Insomnia disorder, Shortness of breath.   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  She was found down today in a puddle of stool and urine.  She brought to our department emergency medicine where she had overt clinical seizure activity.  She was treated with Keppra fosphenytoin and Ativan in the clinical seizures have subsided.  EEG does not show ongoing seizure activity but shows decreased activity throughout the left hemisphere suggesting a postictal state.  She is unable to give any history and remains obtunded at this time.  Imaging studies did not show a new structural lesion.  She was coughing up purulent material in the department of emergency medicine.      Subjective: 2/21 eyes open, mildly responsive to painful stimuli (grimaces). Tolerated 20 minutes of PS today.     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 -Extremely poor prognosis, secondary to significant brain injury.  Status epilepticus  -Per neurology no attempts to wean AEDs for at least 2 months  Positive pseudomonas pneumonia -Completed course of treatment  Acute respiratory failure with hypoxemia -Vent dependent -Daily WUA/SBT   Chronic diastolic CHF -Per neurology target SBP<140 -Amlodipine 10 mg  daily -Clonidine 0.2 mg TID -Increase Enalapril 30 mg daily  -Hydralazine 50 mg TID  Essential HTN  -see CHF    Hypernatremia  -Free water 269m TID   Hypokalemia -Potassium goal> 4  Hypomagnesemia -Magnesium goal> 2  Anemia,    Bilateral upper extremity swelling/edema -Bilateral upper extremity swelling RIGHT>> LEFT. RIGHT upper extremity has PICC line. Will obtain bilateral upper extremity Doppler rule out DVT. -ADDENDUM: Positive R UE DVT. Spoke with neurology Dr. ARory Percy and from a neurologic standpoint (thalamic hemorrhage November 2018), okay to start anticoagulation. Heparin per pharmacy.     Goals of care  -CSW currently working on placement after discussing with daughter options. Family would [facility]   -2/13 PALLIATIVE CARE: Consult placed polysubstance abuse (EtOH, Cocaine), hemorrhagic stroke, severe brain injury, continues to require full vent support. Obtunded CODE STATUS change, hospice -2/14 palliative care attempted to contact family unsuccessfully. [place] or [place] SNF.     DVT prophylaxis: SCD Code Status: Full Family Communication: None Disposition Plan: TBD   Consultants:  [place] [facility]    Procedures/Significant Events:  EEG 1/24>>>This is an abnormal EEG.  There is evidence of moderate generalized slowing of brain activity as well as severe focal slowing in the left temporal lobe.  The patient is not in non-convulsive status epilepticus. 2D echo 1/25>>> EF 550-38% grade 2 diastolic dysfunction  18/82MRI brain  No acute intracranial abnormality. 2. Remote infarcts of the brainstem and thalami  bilaterally. 3. Remote hemorrhage in the left thalamus and left pons. 4. Age advanced atrophy. 5. Fluid in the nasopharynx  and paranasal sinuses is likely secondary to intubation. 2/21 R UE duplex:- Positive for acute DVT subclavian vein, axillary vein and proximal brachial veins.        I have personally reviewed and interpreted all radiology studies and my findings are as above.  VENTILATOR SETTINGS: PRVC Vt set: 569m Set rate: 24 FiO2: 30% PEEP; 5 cm H2O    Cultures 1/30 Blood Cx negative 1/30 Respiratory positive Psuedomonas Aeruginosa    Antimicrobials: Anti-infectives (From admission, onward)   Start     Stop   08/17/17 1145  ceFAZolin (ANCEF) IVPB 1 g/50 mL premix     08/17/17 1144   08/17/17 1120  ceFAZolin (ANCEF) powder 1 g  Status:  Discontinued     08/17/17 1141   08/17/17 1119  ceFAZolin (ANCEF) IVPB 2g/100 mL premix  Status:  Discontinued     08/17/17 1132   08/17/17 1057  ceFAZolin (ANCEF) 2-4 GM/100ML-% IVPB    Comments:  BCamelia Phenes  : cabinet override   08/17/17 1131   08/17/17 0600  ceFAZolin (ANCEF) IVPB 2g/100 mL premix     08/17/17 0553   08/16/17 1530  ceFAZolin (ANCEF) IVPB 2g/100 mL premix  Status:  Discontinued     08/16/17 1526   08/08/17 2300  vancomycin (VANCOCIN) IVPB 750 mg/150 ml premix  Status:  Discontinued     08/10/17 0923   08/08/17 1400  piperacillin-tazobactam (ZOSYN) IVPB 3.375 g  Status:  Discontinued     08/14/17 0745   08/08/17 1100  vancomycin (VANCOCIN) 2,000 mg in sodium chloride 0.9 % 500 mL IVPB     08/08/17 1253       Devices    LINES / TUBES:  CVC 1/24>>>2/6 ETT 1/24>>>2/6 NG/OG 1/24>>> Tracheostomy 2/6>> PICC 2/6 >>     Continuous Infusions: . sodium chloride 10 mL/hr at 08/29/17 1800  . ciprofloxacin Stopped (08/30/17 0517)     Objective: Vitals:   08/30/17 1100 08/30/17 1119 08/30/17 1152 08/30/17 1200  BP: (!) 162/85 (!) 162/85  (!) 170/82  Pulse: 91 94  89  Resp: (!) 25 (!) 24  (!) 24  Temp:   99.3 F (37.4 C)   TempSrc:   Oral   SpO2: 100% 100%  100%  Weight:      Height:        Intake/Output  Summary (Last 24 hours) at 08/30/2017 1334 Last data filed at 08/30/2017 1200 Gross per 24 hour  Intake 2395 ml  Output 1550 ml  Net 845 ml   Filed Weights   08/28/17 0605 08/29/17 0419 08/30/17 0407  Weight: 192 lb 0.3 oz (87.1 kg) 181 lb 7 oz (82.3 kg) 191 lb 2.2 oz (86.7 kg)    Physical Exam:  General:  opens eyes spontaneously, positive  acute respiratory distress (ventilator dependent) Eyes: eyes open spontaneously positive anisocoria, Neck:  Negative scars, masses, torticollis, lymphadenopathy, JVD, #6 cuffed trach in place area clean negative sign of infection Lungs:  tachypnea, Clear to auscultation bilaterally without wheezes or crackles Cardiovascular: Regular rate and rhythm without murmur gallop or rub normal S1 and S2 Abdomen: negative abdominal pain, nondistended, positive soft, bowel sounds, no rebound, no ascites, no appreciable mass, PEG tube in place negative sign of infection Extremities: No significant cyanosis, clubbing. Bilateral upper extremity swelling/cool to the touch RIGHT>> left Skin: Negative rashes, lesions, ulcers Psychiatric:  Unable to assess  secondary to CVA Central nervous system:  Unable to assess secondary CVA. Grimaces to painful stimuli      .     Data Reviewed: Care during the described time interval was provided by me .  I have reviewed this patient's available data, including medical history, events of note, physical examination, and all test results as part of my evaluation.   CBC: Recent Labs  Lab 08/24/17 0349 08/25/17 0431 08/28/17 0355 08/29/17 0309  WBC 10.5 10.7* 12.1* 12.0*  HGB 9.4* 10.1* 9.3* 9.7*  HCT 29.6* 32.7* 29.4* 30.0*  MCV 96.1 95.9 95.1 94.6  PLT 535* 492* 374 824   Basic Metabolic Panel: Recent Labs  Lab 08/24/17 0349 08/25/17 0431 08/26/17 0513 08/27/17 0439 08/28/17 0355 08/29/17 0309 08/30/17 0406  NA 140 140 138 137 139 138 137  K 3.6 3.7 3.6 3.5 3.6 3.5 3.8  CL 113* 111 110 107 110 108 107  CO2  17* 17* 18* 18* 18* 19* 18*  GLUCOSE 192* 158* 259* 266* 215* 142* 247*  BUN 27* 27* 27* 28* 33* 35* 45*  CREATININE 0.92 0.84 0.86 0.95 0.93 0.93 1.20*  CALCIUM 8.5* 8.9 8.6* 8.6* 8.5* 8.6* 8.7*  MG 1.9 1.8  --   --   --   --   --    GFR: Estimated Creatinine Clearance: 54.2 mL/min (A) (by C-G formula based on SCr of 1.2 mg/dL (H)). Liver Function Tests: Recent Labs  Lab 08/27/17 0439 08/28/17 0355  AST 34 36  ALT 25 25  ALKPHOS 73 71  BILITOT 0.4 0.3  PROT 6.7 6.5  ALBUMIN 1.6* 1.6*   No results for input(s): LIPASE, AMYLASE in the last 168 hours. No results for input(s): AMMONIA in the last 168 hours. Coagulation Profile: No results for input(s): INR, PROTIME in the last 168 hours. Cardiac Enzymes: No results for input(s): CKTOTAL, CKMB, CKMBINDEX, TROPONINI in the last 168 hours. BNP (last 3 results) No results for input(s): PROBNP in the last 8760 hours. HbA1C: No results for input(s): HGBA1C in the last 72 hours. CBG: Recent Labs  Lab 08/29/17 1930 08/29/17 2316 08/30/17 0347 08/30/17 0804 08/30/17 1150  GLUCAP 231* 253* 220* 177* 227*   Lipid Profile: No results for input(s): CHOL, HDL, LDLCALC, TRIG, CHOLHDL, LDLDIRECT in the last 72 hours. Thyroid Function Tests: No results for input(s): TSH, T4TOTAL, FREET4, T3FREE, THYROIDAB in the last 72 hours. Anemia Panel: No results for input(s): VITAMINB12, FOLATE, FERRITIN, TIBC, IRON, RETICCTPCT in the last 72 hours. Urine analysis:    Component Value Date/Time   COLORURINE YELLOW 08/05/2017 1216   APPEARANCEUR HAZY (A) 08/05/2017 1216   LABSPEC 1.017 08/05/2017 1216   PHURINE 5.0 08/05/2017 1216   GLUCOSEU NEGATIVE 08/05/2017 1216   GLUCOSEU > 1000 mg/dL (A) 11/01/2007 2056   HGBUR NEGATIVE 08/05/2017 1216   HGBUR negative 07/15/2007 0954   BILIRUBINUR NEGATIVE 08/05/2017 1216   KETONESUR NEGATIVE 08/05/2017 1216   PROTEINUR 30 (A) 08/05/2017 1216   UROBILINOGEN 1.0 04/09/2012 1524   NITRITE NEGATIVE  08/05/2017 1216   LEUKOCYTESUR TRACE (A) 08/05/2017 1216   Sepsis Labs: _0 (procalcitonin:4,lacticidven:4)  ) Recent Results (from the past 240 hour(s))  Culture, respiratory (NON-Expectorated)     Status: None   Collection Time: 08/24/17 10:19 AM  Result Value Ref Range Status   Specimen Description TRACHEAL ASPIRATE  Final   Special Requests NONE  Final   Gram Stain   Final    RARE WBC PRESENT, PREDOMINANTLY MONONUCLEAR RARE SQUAMOUS EPITHELIAL CELLS PRESENT  FEW GRAM NEGATIVE RODS Performed at [facility], [place] [address] [place], [place] [address]    Culture MODERATE PSEUDOMONAS AERUGINOSA  Final   Report Status 08/26/2017 FINAL  Final   Organism ID, Bacteria PSEUDOMONAS AERUGINOSA  Final      Susceptibility   Pseudomonas aeruginosa - MIC*    CEFTAZIDIME 4 SENSITIVE Sensitive     CIPROFLOXACIN <=0.25 SENSITIVE Sensitive     GENTAMICIN <=1 SENSITIVE Sensitive     IMIPENEM 2 SENSITIVE Sensitive     PIP/TAZO 8 SENSITIVE Sensitive     CEFEPIME <=1 SENSITIVE Sensitive     * MODERATE PSEUDOMONAS AERUGINOSA         Radiology Studies: Dg Chest Port 1 View  Result Date: 08/29/2017 CLINICAL DATA:  Respiratory failure EXAM: PORTABLE CHEST 1 VIEW COMPARISON:  08/24/2017 FINDINGS: Cardiac shadow is stable. Tracheostomy tube and right-sided PICC line are noted in satisfactory position. Bibasilar opacities are again seen slightly improved from the prior exam which may be related to better inspiratory effort. No sizable effusion is seen. No bony abnormality is noted. IMPRESSION: Improved bibasilar opacities likely related to an improved inspiratory effort. Electronically Signed   By: Inez Catalina M.D.   On: 08/29/2017 08:08        Scheduled Meds: . acetylcysteine  3 mL Nebulization BID  . amLODipine  10 mg Per Tube Daily  . aspirin  81 mg Per Tube Daily  . chlorhexidine gluconate (MEDLINE KIT)  15 mL Mouth Rinse BID  . Chlorhexidine Gluconate Cloth  6 each Topical  Daily  . cloNIDine  0.2 mg Per Tube TID  . collagenase   Topical Daily  . enalapril  30 mg Per Tube Daily  . feeding supplement (PRO-STAT SUGAR FREE 64)  30 mL Per Tube BID  . feeding supplement (VITAL HIGH PROTEIN)  1,000 mL Per Tube Q24H  . free water  200 mL Per Tube Q8H  . furosemide  40 mg Per Tube BID  . heparin  5,000 Units Subcutaneous Q8H  . hydrALAZINE  100 mg Per Tube Q8H  . insulin aspart  0-20 Units Subcutaneous Q4H  . insulin glargine  30 Units Subcutaneous BID  . labetalol  400 mg Per Tube TID  . lacosamide  200 mg Per Tube BID  . levETIRAcetam  1,000 mg Per Tube BID  . mouth rinse  15 mL Mouth Rinse 10 times per day  . mupirocin cream   Topical Daily  . pantoprazole sodium  40 mg Per Tube QHS  . phenytoin  250 mg Per Tube Q6H  . potassium chloride  50 mEq Per Tube Daily  . thiamine  100 mg Per Tube Daily  . topiramate  200 mg Per Tube BID   Continuous Infusions: . sodium chloride 10 mL/hr at 08/29/17 1800  . ciprofloxacin Stopped (08/30/17 0517)     LOS: 28 days    Time spent: 40 minutes    Paloma Grange, Geraldo Docker, MD [facility] Pager [phone]   If 7PM-7AM, please contact night-coverage [email or web address] Password [facility] 08/30/2017, 1:34 PM

## 2017-08-30 NOTE — Progress Notes (Signed)
Preliminary results by tech - Right Upper Ext. Venous Duplex Completed. Positive for acute deep vein thrombosis involving the subclavian vein, axillary vein and proximal brachial veins. Results give to [place]. Oda Cogan, BS, RDMS, RVT

## 2017-08-30 NOTE — Progress Notes (Signed)
Chart review note  Called by Dr Sherral Hammers RE: opinion on anticoagulation for UE DVT. Reviewed images. GRE image on MRI from 2/2 shows small area of susceptibility in lt thalamus from prior bleed. From a neurological standpoint, I do not see a contraindication to anticoagulation for the indication of DVT. The only caveat is that her pressures need to be controlled better with a systolic blood pressure goal less than 150 at all times.  -- Amie [place], MD [facility] Pager: [phone] If 7pm to 7am, please call on call as listed on AMION.

## 2017-08-30 NOTE — Progress Notes (Signed)
ANTICOAGULATION CONSULT NOTE - Initial Consult  Pharmacy Consult for heparin Indication: DVT  No Known Allergies  Patient Measurements: Height: 5\' 5"  (165.1 cm) Weight: 191 lb 2.2 oz (86.7 kg) IBW/kg (Calculated) : 57 Heparin Dosing Weight: 75.9kg  Vital Signs: Temp: 99.6 F (37.6 C) (02/21 1602) Temp Source: Oral (02/21 1602) BP: 139/71 (02/21 1800) Pulse Rate: 95 (02/21 1800)  Labs: Recent Labs    08/28/17 0355 08/29/17 0309 08/30/17 0406  HGB 9.3* 9.7*  --   HCT 29.4* 30.0*  --   PLT 374 386  --   CREATININE 0.93 0.93 1.20*    Estimated Creatinine Clearance: 54.2 mL/min (A) (by C-G formula based on SCr of 1.2 mg/dL (H)).   Assessment: 19 YOF with 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 She was discovered to have RUE DVT- neurologist weighed in and did not have an absolute contraindications to starting heparin for treatment.   Baseline Hgb low, but appears to be relatively stable, platelets are normal. No overt bleeding noted.  Goal of Therapy:  Heparin level 0.3-0.5 units/ml Monitor platelets by anticoagulation protocol: Yes   Plan:  Will NOT bolus patient given neurologic history and will utilize lower goal Start heparin at 1050 units/hr (14units/kg/hr) First heparin level in 6 hours Daily heparin level and CBC  Elena Davia D. Balraj Brayfield, PharmD, [place] Clinical Pharmacist [phone] 08/30/2017 6:39 PM

## 2017-08-30 NOTE — Progress Notes (Addendum)
3:30pm CSW able to contact pt daughter.  Daughter is continuing to work on Immunologist paperwork.  Has spoken to [employer] and is planning on going to their office to become patient beneficiary  9:20am CSW following for vent SNF placement.  Vent representative tried to get in contact with pt dtr yesterday to follow-up on gathering documents necessary to apply for [place] Medicaid- had to leave message have not heard back from dtr despite dtr stating she would send documents on Tuesday- no documents received and no return calls from dtr since their conversation  CSW attempted to call dtr this morning- left message- awaiting return call  [name], [place] Worker [phone]

## 2017-08-30 NOTE — Progress Notes (Signed)
Orthopedic Tech Progress Note Patient Details:  [name] [birth year] [id number]  Ortho Devices Type of Ortho Device: Prafo boot/shoe Ortho Device/Splint Location: bilateral Ortho Device/Splint Interventions: Application   Post Interventions Patient Tolerated: Well Instructions Provided: Care of device   [name] 08/30/2017, 2:43 PM

## 2017-08-30 NOTE — Progress Notes (Signed)
Pt weaned with PS 5 and PEEP +5 for 30 minutes this A.M. CPAP trial terminated when pt RR stayed above 35. Placed pt back on previous PRVC settings.

## 2017-08-31 LAB — CBC
HEMATOCRIT: 31.3 % — AB (ref 36.0–46.0)
HEMOGLOBIN: 10.1 g/dL — AB (ref 12.0–15.0)
MCH: 30.5 pg (ref 26.0–34.0)
MCHC: 32.3 g/dL (ref 30.0–36.0)
MCV: 94.6 fL (ref 78.0–100.0)
Platelets: 327 10*3/uL (ref 150–400)
RBC: 3.31 MIL/uL — ABNORMAL LOW (ref 3.87–5.11)
RDW: 15.7 % — ABNORMAL HIGH (ref 11.5–15.5)
WBC: 13 10*3/uL — ABNORMAL HIGH (ref 4.0–10.5)

## 2017-08-31 LAB — HEPARIN LEVEL (UNFRACTIONATED)
HEPARIN UNFRACTIONATED: 0.4 [IU]/mL (ref 0.30–0.70)
Heparin Unfractionated: 0.22 IU/mL — ABNORMAL LOW (ref 0.30–0.70)
Heparin Unfractionated: 0.28 IU/mL — ABNORMAL LOW (ref 0.30–0.70)

## 2017-08-31 LAB — BASIC METABOLIC PANEL
Anion gap: 11 (ref 5–15)
BUN: 47 mg/dL — AB (ref 6–20)
CHLORIDE: 106 mmol/L (ref 101–111)
CO2: 18 mmol/L — AB (ref 22–32)
Calcium: 8.7 mg/dL — ABNORMAL LOW (ref 8.9–10.3)
Creatinine, Ser: 1.16 mg/dL — ABNORMAL HIGH (ref 0.44–1.00)
GFR calc Af Amer: 58 mL/min — ABNORMAL LOW (ref >60)
GFR calc non Af Amer: 50 mL/min — ABNORMAL LOW (ref >60)
Glucose, Bld: 222 mg/dL — ABNORMAL HIGH (ref 65–99)
POTASSIUM: 3.8 mmol/L (ref 3.5–5.1)
SODIUM: 135 mmol/L (ref 135–145)

## 2017-08-31 LAB — GLUCOSE, CAPILLARY
Glucose-Capillary: 168 mg/dL — ABNORMAL HIGH (ref 65–99)
Glucose-Capillary: 189 mg/dL — ABNORMAL HIGH (ref 65–99)
Glucose-Capillary: 223 mg/dL — ABNORMAL HIGH (ref 65–99)
Glucose-Capillary: 236 mg/dL — ABNORMAL HIGH (ref 65–99)
Glucose-Capillary: 251 mg/dL — ABNORMAL HIGH (ref 65–99)
Glucose-Capillary: 283 mg/dL — ABNORMAL HIGH (ref 65–99)

## 2017-08-31 LAB — ALBUMIN: Albumin: 1.8 g/dL — ABNORMAL LOW (ref 3.5–5.0)

## 2017-08-31 LAB — PHENYTOIN LEVEL, TOTAL: Phenytoin Lvl: 10.5 ug/mL (ref 10.0–20.0)

## 2017-08-31 LAB — MAGNESIUM: MAGNESIUM: 1.6 mg/dL — AB (ref 1.7–2.4)

## 2017-08-31 MED ORDER — HEPARIN (PORCINE) IN NACL 100-0.45 UNIT/ML-% IJ SOLN
1200.0000 [IU]/h | INTRAMUSCULAR | Status: DC
  Administered 2017-09-01 – 2017-09-02 (×2): 1200 [IU]/h via INTRAVENOUS
  Administered 2017-09-03 – 2017-09-08 (×8): 1350 [IU]/h via INTRAVENOUS
  Administered 2017-09-09: 1300 [IU]/h via INTRAVENOUS
  Administered 2017-09-10: 1200 [IU]/h via INTRAVENOUS
  Filled 2017-08-31 (×12): qty 250

## 2017-08-31 NOTE — Progress Notes (Signed)
ANTICOAGULATION CONSULT NOTE - Follow Up Consult  Pharmacy Consult for heparin Indication: DVT  No Known Allergies  Patient Measurements: Height: 5\' 5"  (165.1 cm) Weight: 192 lb 10.9 oz (87.4 kg) IBW/kg (Calculated) : 57 Heparin Dosing Weight: 75.9kg  Vital Signs: Temp: 99.3 F (37.4 C) (02/22 1034) Temp Source: Oral (02/22 1034) BP: 155/56 (02/22 0709) Pulse Rate: 87 (02/22 0709)  Labs: Recent Labs    08/29/17 0309 08/30/17 0406 08/31/17 0103 08/31/17 0923  HGB 9.7*  --  10.1*  --   HCT 30.0*  --  31.3*  --   PLT 386  --  327  --   HEPARINUNFRC  --   --  0.22* 0.40  CREATININE 0.93 1.20* 1.16*  --     Estimated Creatinine Clearance: 56.3 mL/min (A) (by C-G formula based on SCr of 1.16 mg/dL (H)).   Assessment: 40 YOF with 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 She was discovered to have RUE DVT- neurologist weighed in and did not have an absolute contraindications to starting heparin for treatment.   HL this AM is therapeutic at 0.4. H/H trending up. Plt wnl   Goal of Therapy:  Heparin level 0.3-0.5 units/ml Monitor platelets by anticoagulation protocol: Yes   Plan:  Will NOT bolus patient given neurologic history and will utilize lower goal Continue IV heparin at 1100 units/hr  F/u confirmatory HL this afternoon Daily heparin level and CBC  Albertina Parr, PharmD., BCPS Clinical Pharmacist Pager [phone]

## 2017-08-31 NOTE — Progress Notes (Signed)
Trach care done per RRT. New IC inserted and locked back into place. No distress or complications noted.

## 2017-08-31 NOTE — Progress Notes (Signed)
Results for MITSUKO, LUERA (MRN [id number]) as of 08/31/2017 10:52  Ref. Range 08/30/2017 19:50 08/30/2017 23:10 08/31/2017 03:21 08/31/2017 07:24 08/31/2017 10:32  Glucose-Capillary Latest Ref Range: 65 - 99 mg/dL 228 (H) 224 (H) 168 (H) 251 (H) 223 (H)  Noted that blood sugars have been greater than 180 mg/dl.  If blood sugars continue to be elevated, consider adding Novolog 3 units every 4 hours for tube feed coverage.  Harvel Ricks RN BSN CDE Diabetes Coordinator Pager: [phone]  8am-5pm

## 2017-08-31 NOTE — Progress Notes (Signed)
ANTICOAGULATION CONSULT NOTE - Follow Up Consult  Pharmacy Consult for heparin Indication: DVT  No Known Allergies  Patient Measurements: Height: 5\' 5"  (165.1 cm) Weight: 192 lb 10.9 oz (87.4 kg) IBW/kg (Calculated) : 57 Heparin Dosing Weight: 75.9kg  Vital Signs: Temp: 99.2 F (37.3 C) (02/22 1517) Temp Source: Oral (02/22 1517) BP: 130/46 (02/22 1700) Pulse Rate: 85 (02/22 1700)  Labs: Recent Labs    08/29/17 0309 08/30/17 0406 08/31/17 0103 08/31/17 0923 08/31/17 1524  HGB 9.7*  --  10.1*  --   --   HCT 30.0*  --  31.3*  --   --   PLT 386  --  327  --   --   HEPARINUNFRC  --   --  0.22* 0.40 0.28*  CREATININE 0.93 1.20* 1.16*  --   --     Estimated Creatinine Clearance: 56.3 mL/min (A) (by C-G formula based on SCr of 1.16 mg/dL (H)).   Assessment: 19 YOF with 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 She was discovered to have RUE DVT- neurologist weighed in and did not have an absolute contraindications to starting heparin for treatment.   Heparin level was therapeutic this morning, but has now fallen to just below goal at 0.28 units/mL. No bleeding noted by RN Wells Guiles, mental status is the same.  Goal of Therapy:  Heparin level 0.3-0.5 units/ml Monitor platelets by anticoagulation protocol: Yes   Plan:  Increase IV heparin to 1200 units/hr  Daily heparin level and CBC  Ashara Lounsbury D. Kamarion Zagami, PharmD, BCPS Clinical Pharmacist [phone] 08/31/2017 5:29 PM

## 2017-08-31 NOTE — Progress Notes (Signed)
ANTICOAGULATION CONSULT NOTE - Follow Up Consult  Pharmacy Consult for heparin Indication: DVT in setting of L thalamic hemorrhage 05/2017   Labs: Recent Labs    08/28/17 0355 08/29/17 0309 08/30/17 0406 08/31/17 0103  HGB 9.3* 9.7*  --  10.1*  HCT 29.4* 30.0*  --  31.3*  PLT 374 386  --  327  HEPARINUNFRC  --   --   --  0.22*  CREATININE 0.93 0.93 1.20* 1.16*    Assessment: 61yo female subtherapeutic on heparin with initial dosing for DVT but could need more time to accumulate.  Goal of Therapy:  Heparin level 0.3-0.5 units/ml   Plan:  Will increase heparin gtt slightly 1100 units/hr and check level in 6 hours.    Wynona Neat, PharmD, BCPS  08/31/2017,2:58 AM

## 2017-08-31 NOTE — Progress Notes (Signed)
Pt weaned for 1 hour on PS 5/ PEEP +5 this morning. Terminated CPAP trail d/t RR in the high 30s. Placed pt back on previous PRVC settings.

## 2017-08-31 NOTE — Progress Notes (Signed)
PULMONARY / CRITICAL CARE MEDICINE   Name: [name] MRN: [id number] DOB: Dec 01, 1955    ADMISSION DATE:  08/02/2017 CONSULTATION DATE:  08/02/2017  CHIEF COMPLAINT:  Found down  HISTORY OF PRESENT ILLNESS:    62 year old woman with a complicated hospital course following watershed strokes and ischemic encephalopathy. Admitted for seizure activity.  She remains comatose.  She is ventilated currently, has a tracheostomy in place. Attempting ATC   SUBJECTIVE:  Currently on full vent support after bloody secretions noted through the night 2/19  2/2 suctioning . Secretions have cleared per nursing staff. She does not follow commands   VITAL SIGNS: BP (!) 155/56   Pulse 87   Temp 99.3 F (37.4 C) (Oral)   Resp (!) 24   Ht 5\' 5"  (1.651 m)   Wt 192 lb 10.9 oz (87.4 kg)   SpO2 100%   BMI 32.06 kg/m   HEMODYNAMICS:    VENTILATOR SETTINGS: Vent Mode: PSV;CPAP FiO2 (%):  [30 %] 30 % Set Rate:  [24 bmp] 24 bmp Vt Set:  [500 mL] 500 mL PEEP:  [5 cmH20] 5 cmH20 Pressure Support:  [5 cmH20] 5 cmH20 Plateau Pressure:  [18 cmH20-21 cmH20] 18 cmH20  INTAKE / OUTPUT: I/O last 3 completed shifts: In: 3188.8 [I.V.:448.8; Other:340; NG/GT:1800; IV Piggyback:600] Out: 2500 [Urine:2500]  PHYSICAL EXAMINATION:  General: 62 year old female NAD on vent , asleep and on full vent support HEENT: mm moist, #6 cuffed Shiley unremarkable, secure and intact,  Pulmonary: resps even non labored on vent, few rhonchi noted, bilateral chest excursion noted Cardiac: Regular rate and rhythm, S1, S2, No RMG Extremities/musculoskeletal: Generalized anasarca, generalized weakness Abdomen: soft, +bs, non-distended  Neuro: opens eyes, does not track, does not focus, does not follow commands  LABS:  BMET Recent Labs  Lab 08/29/17 0309 08/30/17 0406 08/31/17 0103  NA 138 137 135  K 3.5 3.8 3.8  CL 108 107 106  CO2 19* 18* 18*  BUN 35* 45* 47*  CREATININE 0.93 1.20* 1.16*  GLUCOSE 142* 247*  222*   Electrolytes Recent Labs  Lab 08/25/17 0431  08/29/17 0309 08/30/17 0406 08/31/17 0103  CALCIUM 8.9   < > 8.6* 8.7* 8.7*  MG 1.8  --   --   --  1.6*   < > = values in this interval not displayed.   CBC Recent Labs  Lab 08/28/17 0355 08/29/17 0309 08/31/17 0103  WBC 12.1* 12.0* 13.0*  HGB 9.3* 9.7* 10.1*  HCT 29.4* 30.0* 31.3*  PLT 374 386 327   Coag's No results for input(s): APTT, INR in the last 168 hours. Sepsis Markers No results for input(s): LATICACIDVEN, PROCALCITON, O2SATVEN in the last 168 hours.  ABG No results for input(s): PHART, PCO2ART, PO2ART in the last 168 hours.  Liver Enzymes Recent Labs  Lab 08/27/17 0439 08/28/17 0355 08/31/17 0103  AST 34 36  --   ALT 25 25  --   ALKPHOS 73 71  --   BILITOT 0.4 0.3  --   ALBUMIN 1.6* 1.6* 1.8*   Cardiac Enzymes No results for input(s): TROPONINI, PROBNP in the last 168 hours.  Glucose Recent Labs  Lab 08/30/17 1150 08/30/17 1526 08/30/17 1950 08/30/17 2310 08/31/17 0321 08/31/17 0724  GLUCAP 227* 231* 228* 224* 168* 251*   Imaging No results found. STUDIES:  EEG 1/24>>>This is an abnormal EEG.There is evidence of moderate generalized slowing of brain activity as well as severe focal slowing in the left temporal lobe. The patient is not in  non-convulsive status epilepticus. 2D echo 1/25>>> EF 40-10%, grade 2 diastolic dysfunction  MRI brain 1/25>>> 1. No acute intracranial abnormality. 2. 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 3. Remote hemorrhage in the left thalamus and left pons. 4. Age advanced atrophy. 5. Fluid in the nasopharynx and paranasal sinuses is likely secondary to intubation.  CULTURES: Blood Cx 1/30>>> Respiratory 1/30>>>Psuedomonas  2/15 sputum>>> pseudomonas   ANTIBIOTICS: Vancomycin 1/30>>> 1/31 Zosyn 1/30>>> 2/5 Cipro 2/17>>  LINES/TUBES: CVC 1/24>>>2/6 ETT 1/24>>>2/6 NG/OG 1/24>>> Tracheostomy 2/6>> PICC 2/6 >>   ASSESSMENT /  PLAN:  Acute respiratory failure with hypoxemia-Improving Tracheostomy status HTN Anemia, no bleeding  Pseudomonas Pneumonia  Hyperglycemia  Non-anion gap metabolic acidosis suspect due to hyperchloremia Watershed infarct/ischemic encephalopathy Acute encephalopathy Status epilepticus   Pulmonary problem 62 year old female who is tracheostomy and ventilator dependent setting of ischemic encephalopathy Course has been complicated by refractory status epilepticus, Pseudomonas pneumonia, and volume overload.   We have not been able to liberate her from the ventilator, she is not a candidate for decannulation. She has been unable to tolerate  ATC thus far due to RR. She most likely will need ventilator SNF. Bloody secretions have resolved   Plan/rec Follow-up chest x-ray intermittently  Diuresis as tol  Continue attempts at PS/ ATC wean but has not tolerated - highly doubt she will be completely liberated from vent  Continue search for vent SNF to accept her  Palliative care following  Minimize suctioning Monitor CBC Observe for active bleeding from trach  PCCM will continue to follow intermittently for trach/vent.   Magdalen Spatz, AGACNP-BC 08/31/2017  10:23 AM Pager:  [phone]

## 2017-08-31 NOTE — Progress Notes (Addendum)
PROGRESS NOTE    [name]  ZES:[id number] DOB: May 14, 1956 DOA: 08/02/2017 PCP: Nolene Ebbs, MD   Brief Narrative:  62 year old BF PMHx polysubstance abuse (EtOH, Cocaine)Diabetes mellitus, HTN, Insomnia disorder, Shortness of breath.   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  She was found down today in a puddle of stool and urine.  She brought to our department emergency medicine where she had overt clinical seizure activity.  She was treated with Keppra fosphenytoin and Ativan in the clinical seizures have subsided.  EEG does not show ongoing seizure activity but shows decreased activity throughout the left hemisphere suggesting a postictal state.  She is unable to give any history and remains obtunded at this time.  Imaging studies did not show a new structural lesion.  She was coughing up purulent material in the department of emergency medicine.      Subjective: 2/22 opens eyes spontaneously. Grimaces to painful stimuli. Tolerated 20 minutes of pressure support today.    Assessment & Plan:   Active Problems:   Uncontrolled hypertension   Cerebrovascular accident (CVA) ([place])   Benign essential HTN   Status epilepticus (HCC)   Altered mental status   Cocaine abuse ([place])   Pressure injury of skin   Acute respiratory failure with hypoxemia (HCC)   Metabolic acidosis   Tracheostomy status (HCC)   Chronic diastolic CHF (congestive heart failure) (HCC)   Acute deep vein thrombosis (DVT) of right upper extremity (HCC)   Polysubstance abuse (EtOH, Cocaine)   CVA/Watershed infarct/Acute encephalopath -Patient now obtunded secondary to polysubstance abuse, noncompliance---> refractory seizures and CVA. -Extremely poor prognosis, secondary to significant brain injury.  Status epilepticus  -Per neurology no attempts to wean AEDs for at least 2 months  Positive pseudomonas pneumonia -Completed course of  treatment  Acute respiratory failure with hypoxemia -Vent dependent -Daily WUA/SBT   Chronic diastolic CHF -Per neurology target SBP<140 -Amlodipine 10 mg daily -Clonidine 0.2 mg TID -Enalapril 30 mg daily  -Hydralazine 50 mg TID  Essential HTN  -see CHF    Hypernatremia  -Free water 249m TID   Hypokalemia -Potassium goal> 4  Hypomagnesemia -Magnesium goal> 2  Anemia,    Bilateral upper extremity swelling/edema -Bilateral upper extremity swelling RIGHT>> LEFT. RIGHT upper extremity has PICC line. Will obtain bilateral upper extremity Doppler rule out DVT. -ADDENDUM: Positive R UE DVT. Spoke with neurology Dr. ARory Percy and from a neurologic standpoint (thalamic hemorrhage November 2018), okay to start anticoagulation. Heparin per pharmacy.     Goals of care  -CSW currently working on placement after discussing with daughter options. Family would [facility]   -2/13 PALLIATIVE CARE: Consult placed polysubstance abuse (EtOH, Cocaine), hemorrhagic stroke, severe brain injury, continues to require full vent support. Obtunded CODE STATUS change, hospice -2/14 palliative care attempted to contact family unsuccessfully. [place] or [place] SNF.     DVT prophylaxis: SCD Code Status: Full Family Communication: None Disposition Plan: TBD   [facility]  [facility]    Procedures/Significant Events:  1/24 EEG:-Abnormal , evidence of moderate generalized slowing of brain activity as well as severe focal slowing in the left temporal lobe.  The patient is not in non-convulsive status epilepticus. 2D echo 1/25>>> EF 526-33% grade 2 diastolic  dysfunction  1/25 MRI brain  No acute intracranial abnormality. 2. Remote infarcts of the brainstem and  thalami bilaterally. 3. Remote hemorrhage in the left thalamus and left pons. 4. Age advanced atrophy. 5. Fluid in the nasopharynx and paranasal sinuses is likely secondary to intubation. 2/21 R UE duplex:- Positive for acute DVT subclavian vein, axillary vein and proximal brachial veins.        I have personally reviewed and interpreted all radiology studies and my findings are as above.  VENTILATOR SETTINGS: PRVC Vt set: 554m Set rate: 15 FiO2: 30% PEEP; 5 cm H2O    Cultures 1/30 Blood Cx negative 1/30 Respiratory positive Psuedomonas Aeruginosa    Antimicrobials: Anti-infectives (From admission, onward)   Start     Stop   08/17/17 1145  ceFAZolin (ANCEF) IVPB 1 g/50 mL premix     08/17/17 1144   08/17/17 1120  ceFAZolin (ANCEF) powder 1 g  Status:  Discontinued     08/17/17 1141   08/17/17 1119  ceFAZolin (ANCEF) IVPB 2g/100 mL premix  Status:  Discontinued     08/17/17 1132   08/17/17 1057  ceFAZolin (ANCEF) 2-4 GM/100ML-% IVPB    Comments:  BCamelia Phenes  : cabinet override   08/17/17 1131   08/17/17 0600  ceFAZolin (ANCEF) IVPB 2g/100 mL premix     08/17/17 0553   08/16/17 1530  ceFAZolin (ANCEF) IVPB 2g/100 mL premix  Status:  Discontinued     08/16/17 1526   08/08/17 2300  vancomycin (VANCOCIN) IVPB 750 mg/150 ml premix  Status:  Discontinued     08/10/17 0923   08/08/17 1400  piperacillin-tazobactam (ZOSYN) IVPB 3.375 g  Status:  Discontinued     08/14/17 0745   08/08/17 1100  vancomycin (VANCOCIN) 2,000 mg in sodium chloride 0.9 % 500 mL IVPB     08/08/17 1253       Devices    LINES / TUBES:  CVC 1/24>>>2/6 ETT 1/24>>>2/6 NG/OG 1/24>>> Tracheostomy 2/6>> PICC 2/6 >>     Continuous Infusions: . sodium chloride 10 mL/hr at 08/30/17 2044  . ciprofloxacin Stopped (08/31/17 0730)  . heparin 1,100 Units/hr (08/31/17 0257)     Objective: Vitals:   08/31/17 0726 08/31/17 1034 08/31/17 1145 08/31/17 1222  BP:   (!) 163/69 (!) 158/63   Pulse:   91 85  Resp:   (!) 25 16  Temp: 99.3 F (37.4 C) 99.3 F (37.4 C)    TempSrc: Oral Oral    SpO2:   100% 100%  Weight:      Height:        Intake/Output Summary (Last 24 hours) at 08/31/2017 1415 Last data filed at 08/31/2017 0730 Gross per 24 hour  Intake 1718.83 ml  Output 1350 ml  Net 368.83 ml   Filed Weights   08/29/17 0419 08/30/17 0407 08/31/17 0354  Weight: 181 lb 7 oz (82.3 kg) 191 lb 2.2 oz (86.7 kg) 192 lb 10.9 oz (87.4 kg)    Physical Exam:  General: Obtunded, acute respiratory distress Eyes: eyes open spontaneously positive anisocoria, Neck:  Negative scars, masses, torticollis, lymphadenopathy, JVD, #6 cuffed trach in place area clean negative sign of infection Lungs: Clear to auscultation bilaterally without wheezes or crackles Cardiovascular: Regular rate and rhythm without murmur gallop or rub normal S1 and S2 Abdomen: negative abdominal pain, nondistended, positive soft, bowel sounds, no rebound, no ascites, no appreciable mass, PEG tube in place negative sign of infection Extremities:  bilateral upper extremity swelling/cool to the  touch RIGHT >> left  Skin: Negative rashes, lesions, ulcers Psychiatric:  Unable to assess secondary to CVA Central nervous system:   grimaces to painful stimuli, otherwise Unable to assess secondary CVA       .     Data Reviewed: Care during the described time interval was provided by me .  I have reviewed this patient's available data, including medical history, events of note, physical examination, and all test results as part of my evaluation.   CBC: Recent Labs  Lab 08/25/17 0431 08/28/17 0355 08/29/17 0309 08/31/17 0103  WBC 10.7* 12.1* 12.0* 13.0*  HGB 10.1* 9.3* 9.7* 10.1*  HCT 32.7* 29.4* 30.0* 31.3*  MCV 95.9 95.1 94.6 94.6  PLT 492* 374 386 256   Basic Metabolic Panel: Recent Labs  Lab 08/25/17 0431  08/27/17 0439 08/28/17 0355 08/29/17 0309 08/30/17 0406 08/31/17 0103  NA 140   < >  137 139 138 137 135  K 3.7   < > 3.5 3.6 3.5 3.8 3.8  CL 111   < > 107 110 108 107 106  CO2 17*   < > 18* 18* 19* 18* 18*  GLUCOSE 158*   < > 266* 215* 142* 247* 222*  BUN 27*   < > 28* 33* 35* 45* 47*  CREATININE 0.84   < > 0.95 0.93 0.93 1.20* 1.16*  CALCIUM 8.9   < > 8.6* 8.5* 8.6* 8.7* 8.7*  MG 1.8  --   --   --   --   --  1.6*   < > = values in this interval not displayed.   GFR: Estimated Creatinine Clearance: 56.3 mL/min (A) (by C-G formula based on SCr of 1.16 mg/dL (H)). Liver Function Tests: Recent Labs  Lab 08/27/17 0439 08/28/17 0355 08/31/17 0103  AST 34 36  --   ALT 25 25  --   ALKPHOS 73 71  --   BILITOT 0.4 0.3  --   PROT 6.7 6.5  --   ALBUMIN 1.6* 1.6* 1.8*   No results for input(s): LIPASE, AMYLASE in the last 168 hours. No results for input(s): AMMONIA in the last 168 hours. Coagulation Profile: No results for input(s): INR, PROTIME in the last 168 hours. Cardiac Enzymes: No results for input(s): CKTOTAL, CKMB, CKMBINDEX, TROPONINI in the last 168 hours. BNP (last 3 results) No results for input(s): PROBNP in the last 8760 hours. HbA1C: No results for input(s): HGBA1C in the last 72 hours. CBG: Recent Labs  Lab 08/30/17 1950 08/30/17 2310 08/31/17 0321 08/31/17 0724 08/31/17 1032  GLUCAP 228* 224* 168* 251* 223*   Lipid Profile: No results for input(s): CHOL, HDL, LDLCALC, TRIG, CHOLHDL, LDLDIRECT in the last 72 hours. Thyroid Function Tests: No results for input(s): TSH, T4TOTAL, FREET4, T3FREE, THYROIDAB in the last 72 hours. Anemia Panel: No results for input(s): VITAMINB12, FOLATE, FERRITIN, TIBC, IRON, RETICCTPCT in the last 72 hours. Urine analysis:    Component Value Date/Time   COLORURINE YELLOW 08/05/2017 1216   APPEARANCEUR HAZY (A) 08/05/2017 1216   LABSPEC 1.017 08/05/2017 1216   PHURINE 5.0 08/05/2017 1216   GLUCOSEU NEGATIVE 08/05/2017 1216   GLUCOSEU > 1000 mg/dL (A) 11/01/2007 2056   HGBUR NEGATIVE 08/05/2017 1216    HGBUR negative 07/15/2007 0954   BILIRUBINUR NEGATIVE 08/05/2017 1216   KETONESUR NEGATIVE 08/05/2017 1216   PROTEINUR 30 (A) 08/05/2017 1216   UROBILINOGEN 1.0 04/09/2012 1524   NITRITE NEGATIVE 08/05/2017 1216   LEUKOCYTESUR TRACE (A) 08/05/2017 1216  Sepsis Labs: _0 (procalcitonin:4,lacticidven:4)  ) Recent Results (from the past 240 hour(s))  Culture, respiratory (NON-Expectorated)     Status: None   Collection Time: 08/24/17 10:19 AM  Result Value Ref Range Status   Specimen Description TRACHEAL ASPIRATE  Final   Special Requests NONE  Final   Gram Stain   Final    RARE WBC PRESENT, PREDOMINANTLY MONONUCLEAR RARE SQUAMOUS EPITHELIAL CELLS PRESENT FEW GRAM NEGATIVE RODS Performed at [facility], [place] [address], [place], [place] [address]    Culture MODERATE PSEUDOMONAS AERUGINOSA  Final   Report Status 08/26/2017 FINAL  Final   Organism ID, Bacteria PSEUDOMONAS AERUGINOSA  Final      Susceptibility   Pseudomonas aeruginosa - MIC*    CEFTAZIDIME 4 SENSITIVE Sensitive     CIPROFLOXACIN <=0.25 SENSITIVE Sensitive     GENTAMICIN <=1 SENSITIVE Sensitive     IMIPENEM 2 SENSITIVE Sensitive     PIP/TAZO 8 SENSITIVE Sensitive     CEFEPIME <=1 SENSITIVE Sensitive     * MODERATE PSEUDOMONAS AERUGINOSA         Radiology Studies: No results found.      Scheduled Meds: . amLODipine  10 mg Per Tube Daily  . aspirin  81 mg Per Tube Daily  . chlorhexidine gluconate (MEDLINE KIT)  15 mL Mouth Rinse BID  . Chlorhexidine Gluconate Cloth  6 each Topical Daily  . cloNIDine  0.2 mg Per Tube TID  . collagenase   Topical Daily  . enalapril  30 mg Per Tube Daily  . feeding supplement (PRO-STAT SUGAR FREE 64)  30 mL Per Tube BID  . feeding supplement (VITAL HIGH PROTEIN)  1,000 mL Per Tube Q24H  . free water  200 mL Per Tube Q8H  . furosemide  40 mg Per Tube BID  . hydrALAZINE  100 mg Per Tube Q8H  . insulin aspart  0-20 Units Subcutaneous Q4H  . insulin  glargine  30 Units Subcutaneous BID  . labetalol  400 mg Per Tube TID  . lacosamide  200 mg Per Tube BID  . levETIRAcetam  1,000 mg Per Tube BID  . mouth rinse  15 mL Mouth Rinse 10 times per day  . mupirocin cream   Topical Daily  . pantoprazole sodium  40 mg Per Tube QHS  . phenytoin  250 mg Per Tube Q6H  . potassium chloride  50 mEq Per Tube Daily  . thiamine  100 mg Per Tube Daily  . topiramate  200 mg Per Tube BID   Continuous Infusions: . sodium chloride 10 mL/hr at 08/30/17 2044  . ciprofloxacin Stopped (08/31/17 0730)  . heparin 1,100 Units/hr (08/31/17 0257)     LOS: 29 days    Time spent: 40 minutes    Tasfia Vasseur, Geraldo Docker, MD [facility] Pager [phone]   If 7PM-7AM, please contact night-coverage [email or web address] Password [email or web address] 08/31/2017, 2:15 PM

## 2017-08-31 NOTE — Progress Notes (Signed)
PT weaned for 30 more minutes this afternoon for a total of 1.5 hours today on PS 5/PEEP +5. Both SBT terminated d/t sustained RR in the high 30s. CCM aware.

## 2017-09-01 LAB — CBC
HEMATOCRIT: 30.3 % — AB (ref 36.0–46.0)
Hemoglobin: 10.1 g/dL — ABNORMAL LOW (ref 12.0–15.0)
MCH: 31.8 pg (ref 26.0–34.0)
MCHC: 33.3 g/dL (ref 30.0–36.0)
MCV: 95.3 fL (ref 78.0–100.0)
Platelets: 346 10*3/uL (ref 150–400)
RBC: 3.18 MIL/uL — AB (ref 3.87–5.11)
RDW: 15.8 % — ABNORMAL HIGH (ref 11.5–15.5)
WBC: 13.2 10*3/uL — ABNORMAL HIGH (ref 4.0–10.5)

## 2017-09-01 LAB — BASIC METABOLIC PANEL
ANION GAP: 13 (ref 5–15)
BUN: 51 mg/dL — AB (ref 6–20)
CHLORIDE: 107 mmol/L (ref 101–111)
CO2: 17 mmol/L — ABNORMAL LOW (ref 22–32)
Calcium: 8.7 mg/dL — ABNORMAL LOW (ref 8.9–10.3)
Creatinine, Ser: 1.14 mg/dL — ABNORMAL HIGH (ref 0.44–1.00)
GFR calc Af Amer: 59 mL/min — ABNORMAL LOW (ref >60)
GFR, EST NON AFRICAN AMERICAN: 51 mL/min — AB (ref >60)
GLUCOSE: 201 mg/dL — AB (ref 65–99)
POTASSIUM: 3.7 mmol/L (ref 3.5–5.1)
Sodium: 137 mmol/L (ref 135–145)

## 2017-09-01 LAB — HEPARIN LEVEL (UNFRACTIONATED)
HEPARIN UNFRACTIONATED: 0.45 [IU]/mL (ref 0.30–0.70)
Heparin Unfractionated: 0.36 IU/mL (ref 0.30–0.70)

## 2017-09-01 LAB — GLUCOSE, CAPILLARY
GLUCOSE-CAPILLARY: 190 mg/dL — AB (ref 65–99)
GLUCOSE-CAPILLARY: 221 mg/dL — AB (ref 65–99)
GLUCOSE-CAPILLARY: 151 mg/dL — AB (ref 65–99)
Glucose-Capillary: 255 mg/dL — ABNORMAL HIGH (ref 65–99)
Glucose-Capillary: 189 mg/dL — ABNORMAL HIGH (ref 65–99)

## 2017-09-01 LAB — MAGNESIUM: MAGNESIUM: 1.6 mg/dL — AB (ref 1.7–2.4)

## 2017-09-01 MED ORDER — SODIUM CHLORIDE 0.9 % IV BOLUS (SEPSIS)
500.0000 mL | Freq: Once | INTRAVENOUS | Status: AC
  Administered 2017-09-01: 500 mL via INTRAVENOUS

## 2017-09-01 MED ORDER — SODIUM CHLORIDE 0.9 % IV BOLUS (SEPSIS): 1000.0000 mL | Freq: Once | INTRAVENOUS | Status: DC

## 2017-09-01 MED ORDER — MAGNESIUM SULFATE 2 GM/50ML IV SOLN
2.0000 g | Freq: Once | INTRAVENOUS | Status: AC
  Administered 2017-09-01: 2 g via INTRAVENOUS
  Filled 2017-09-01: qty 50

## 2017-09-01 NOTE — Progress Notes (Addendum)
PROGRESS NOTE    [name]  UEA:[id number] DOB: 07/13/1955 DOA: 08/02/2017 PCP: Nolene Ebbs, MD   Brief Narrative:  62 year old BF PMHx polysubstance abuse (EtOH, Cocaine), Diabetes mellitus type II uncontrolled with complication, HTN, Insomnia disorder, Shortness of breath.   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  She was found down today in a puddle of stool and urine.  She brought to our department emergency medicine where she had overt clinical seizure activity.  She was treated with Keppra fosphenytoin and Ativan in the clinical seizures have subsided.  EEG does not show ongoing seizure activity but shows decreased activity throughout the left hemisphere suggesting a postictal state.  She is unable to give any history and remains obtunded at this time.  Imaging studies did not show a new structural lesion.  She was coughing up purulent material in the department of emergency medicine.      Subjective: 2/23 opens eyes spontaneously. Grimaces slightly to painful stimuli.     Assessment & Plan:   Active Problems:   Uncontrolled hypertension   Cerebrovascular accident (CVA) ([place])   Benign essential HTN   Status epilepticus (HCC)   Altered mental status   Cocaine abuse ([place])   Pressure injury of skin   Acute respiratory failure with hypoxemia (HCC)   Metabolic acidosis   Tracheostomy status (HCC)   Chronic diastolic CHF (congestive heart failure) (HCC)   Acute deep vein thrombosis (DVT) of right upper extremity (HCC)   Polysubstance abuse (EtOH, Cocaine)   CVA/[place] infarct/Acute encephalopath -Patient now obtunded secondary to polysubstance abuse, noncompliance---> refractory seizures and CVA. -Extremely poor prognosis, secondary to significant brain injury.  Status epilepticus  -Per neurology no attempts to wean AEDs for at least 2 months  Positive pseudomonas pneumonia -Completed course of  treatment  Acute respiratory failure with hypoxemia -Vent dependent -Daily WUA/SBT   Chronic diastolic YNW@@@ -Per neurology target SBP<140 -Amlodipine 10 mg daily -Clonidine 0.2 mg TID -Enalapril 30 mg daily  -Hydralazine 50 mg TID  Essential HTN  -see CHF    Hypernatremia  -Free water 264m TID   Hypokalemia -Potassium goal> 4  Hypomagnesemia@@@ -Magnesium goal> 2 -Magnesium IV 2 g  Anemia,    Acute RIGHT upper extremity DVT -Bilateral upper extremity swelling RIGHT>> LEFT. RIGHT upper extremity has PICC line. Will obtain bilateral upper extremity Doppler rule out DVT. -ADDENDUM: Positive R UE DVT. Spoke with neurology Dr. ARory Percy and from a neurologic standpoint (thalamic hemorrhage November 2018), okay to start anticoagulation. Heparin per pharmacy.  Diabetes type 2 uncontrolled with complication@@@@ -22/95increase Lantus 33 units BID -Resistant SSI      Goals of care  -CSW currently working on placement after discussing with daughter options. [facility]   -2/13 PALLIATIVE CARE: Consult placed polysubstance abuse (EtOH, Cocaine), hemorrhagic stroke, severe brain injury, continues to require full vent support. Obtunded CODE STATUS change, hospice -2/14 palliative care attempted to contact family unsuccessfully. [place] Spoke with LWynetta Emer[place]from [place] not except patient in LTAC or vent SNF.     DVT prophylaxis: SCD Code Status: Full Family Communication: None Disposition Plan: TBD   [facility]    Procedures/Significant Events:  1/24 EEG:-Abnormal , evidence of moderate generalized slowing of brain activity as well as severe focal slowing  in the left temporal lobe.  The patient is not in non-convulsive status epilepticus. 2D echo 1/25>>> EF 55-60%,  grade 2 diastolic dysfunction  1/06 MRI brain  No acute intracranial abnormality. 2. 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 3. Remote hemorrhage in the left thalamus and left pons. 4. Age advanced atrophy. 5. Fluid in the nasopharynx and paranasal sinuses is likely secondary to intubation. 2/21 R UE duplex:- Positive for acute DVT subclavian vein, axillary vein and proximal brachial veins.        I have personally reviewed and interpreted all radiology studies and my findings are as above.  VENTILATOR SETTINGS: PRVC Vt set: 529m Set rate: 15 FiO2: 30% PEEP; 5 cm H2O    Cultures 1/30 Blood Cx negative 1/30 Respiratory positive Psuedomonas Aeruginosa    Antimicrobials: Anti-infectives (From admission, onward)   Start     Stop   08/17/17 1145  ceFAZolin (ANCEF) IVPB 1 g/50 mL premix     08/17/17 1144   08/17/17 1120  ceFAZolin (ANCEF) powder 1 g  Status:  Discontinued     08/17/17 1141   08/17/17 1119  ceFAZolin (ANCEF) IVPB 2g/100 mL premix  Status:  Discontinued     08/17/17 1132   08/17/17 1057  ceFAZolin (ANCEF) 2-4 GM/100ML-% IVPB    Comments:  BCamelia Phenes  : cabinet override   08/17/17 1131   08/17/17 0600  ceFAZolin (ANCEF) IVPB 2g/100 mL premix     08/17/17 0553   08/16/17 1530  ceFAZolin (ANCEF) IVPB 2g/100 mL premix  Status:  Discontinued     08/16/17 1526   08/08/17 2300  vancomycin (VANCOCIN) IVPB 750 mg/150 ml premix  Status:  Discontinued     08/10/17 0923   08/08/17 1400  piperacillin-tazobactam (ZOSYN) IVPB 3.375 g  Status:  Discontinued     08/14/17 0745   08/08/17 1100  vancomycin (VANCOCIN) 2,000 mg in sodium chloride 0.9 % 500 mL IVPB     08/08/17 1253       Devices    LINES / TUBES:  CVC 1/24>>>2/6 ETT 1/24>>>2/6 NG/OG 1/24>>> Tracheostomy 2/6>> PICC 2/6 >>     Continuous Infusions: . sodium chloride 10 mL/hr at 09/01/17 0600  . ciprofloxacin Stopped (09/01/17 0503)  . heparin 1,200 Units/hr (09/01/17 0600)      Objective: Vitals:   09/01/17 0718 09/01/17 0720 09/01/17 0755 09/01/17 0800  BP: (!) 134/52 (!) 134/52  (!) 134/54  Pulse: 84 89  90  Resp: (!) 21 (!) 29  (!) 30  Temp:   99.4 F (37.4 C)   TempSrc:   Oral   SpO2: 100% 100%  96%  Weight:      Height:        Intake/Output Summary (Last 24 hours) at 09/01/2017 0841 Last data filed at 09/01/2017 0600 Gross per 24 hour  Intake 2333.78 ml  Output 2220 ml  Net 113.78 ml   Filed Weights   08/30/17 0407 08/31/17 0354 09/01/17 0500  Weight: 191 lb 2.2 oz (86.7 kg) 192 lb 10.9 oz (87.4 kg) 184 lb 4.9 oz (83.6 kg)    Physical Exam:  General: Obtunded, acute respiratory distress Eyes: eyes open spontaneously positive anisocoria, Neck:  Negative scars, masses, torticollis, lymphadenopathy, JVD, #6 cuffed trach in place area clean negative sign of infection Lungs: Clear to auscultation bilaterally without wheezes or crackles Cardiovascular: Regular rate and rhythm without murmur gallop or rub normal S1 and S2 Abdomen: negative abdominal pain, nondistended, positive soft, bowel sounds, no  rebound, no ascites, no appreciable mass, PEG tube in place negative sign of infection Extremities:  bilateral upper extremity swelling/cool to the touch RIGHT >> left  Skin: Negative rashes, lesions, ulcers Psychiatric:  Unable to assess secondary to CVA Central nervous system:   grimaces to painful stimuli, otherwise Unable to assess secondary CVA       .     Data Reviewed: Care during the described time interval was provided by me .  I have reviewed this patient's available data, including medical history, events of note, physical examination, and all test results as part of my evaluation.   CBC: Recent Labs  Lab 08/28/17 0355 08/29/17 0309 08/31/17 0103 09/01/17 0420  WBC 12.1* 12.0* 13.0* 13.2*  HGB 9.3* 9.7* 10.1* 10.1*  HCT 29.4* 30.0* 31.3* 30.3*  MCV 95.1 94.6 94.6 95.3  PLT 374 386 327 762   Basic Metabolic  Panel: Recent Labs  Lab 08/28/17 0355 08/29/17 0309 08/30/17 0406 08/31/17 0103 09/01/17 0420  NA 139 138 137 135 137  K 3.6 3.5 3.8 3.8 3.7  CL 110 108 107 106 107  CO2 18* 19* 18* 18* 17*  GLUCOSE 215* 142* 247* 222* 201*  BUN 33* 35* 45* 47* 51*  CREATININE 0.93 0.93 1.20* 1.16* 1.14*  CALCIUM 8.5* 8.6* 8.7* 8.7* 8.7*  MG  --   --   --  1.6* 1.6*   GFR: Estimated Creatinine Clearance: 56 mL/min (A) (by C-G formula based on SCr of 1.14 mg/dL (H)). Liver Function Tests: Recent Labs  Lab 08/27/17 0439 08/28/17 0355 08/31/17 0103  AST 34 36  --   ALT 25 25  --   ALKPHOS 73 71  --   BILITOT 0.4 0.3  --   PROT 6.7 6.5  --   ALBUMIN 1.6* 1.6* 1.8*   No results for input(s): LIPASE, AMYLASE in the last 168 hours. No results for input(s): AMMONIA in the last 168 hours. Coagulation Profile: No results for input(s): INR, PROTIME in the last 168 hours. Cardiac Enzymes: No results for input(s): CKTOTAL, CKMB, CKMBINDEX, TROPONINI in the last 168 hours. BNP (last 3 results) No results for input(s): PROBNP in the last 8760 hours. HbA1C: No results for input(s): HGBA1C in the last 72 hours. CBG: Recent Labs  Lab 08/31/17 1515 08/31/17 2031 08/31/17 2339 09/01/17 0425 09/01/17 0754  GLUCAP 189* 283* 236* 190* 221*   Lipid Profile: No results for input(s): CHOL, HDL, LDLCALC, TRIG, CHOLHDL, LDLDIRECT in the last 72 hours. Thyroid Function Tests: No results for input(s): TSH, T4TOTAL, FREET4, T3FREE, THYROIDAB in the last 72 hours. Anemia Panel: No results for input(s): VITAMINB12, FOLATE, FERRITIN, TIBC, IRON, RETICCTPCT in the last 72 hours. Urine analysis:    Component Value Date/Time   COLORURINE YELLOW 08/05/2017 1216   APPEARANCEUR HAZY (A) 08/05/2017 1216   LABSPEC 1.017 08/05/2017 1216   PHURINE 5.0 08/05/2017 1216   GLUCOSEU NEGATIVE 08/05/2017 1216   GLUCOSEU > 1000 mg/dL (A) 11/01/2007 2056   HGBUR NEGATIVE 08/05/2017 1216   HGBUR negative 07/15/2007  0954   BILIRUBINUR NEGATIVE 08/05/2017 1216   KETONESUR NEGATIVE 08/05/2017 1216   PROTEINUR 30 (A) 08/05/2017 1216   UROBILINOGEN 1.0 04/09/2012 1524   NITRITE NEGATIVE 08/05/2017 1216   LEUKOCYTESUR TRACE (A) 08/05/2017 1216   Sepsis Labs: _0 (procalcitonin:4,lacticidven:4)  ) Recent Results (from the past 240 hour(s))  Culture, respiratory (NON-Expectorated)     Status: None   Collection Time: 08/24/17 10:19 AM  Result Value Ref Range Status   Specimen  Description TRACHEAL ASPIRATE  Final   Special Requests NONE  Final   Gram Stain   Final    RARE WBC PRESENT, PREDOMINANTLY MONONUCLEAR RARE SQUAMOUS EPITHELIAL CELLS PRESENT FEW GRAM NEGATIVE RODS Performed at [facility], [place] [address] [place], [place] [address]    Culture MODERATE PSEUDOMONAS AERUGINOSA  Final   Report Status 08/26/2017 FINAL  Final   Organism ID, Bacteria PSEUDOMONAS AERUGINOSA  Final      Susceptibility   Pseudomonas aeruginosa - MIC*    CEFTAZIDIME 4 SENSITIVE Sensitive     CIPROFLOXACIN <=0.25 SENSITIVE Sensitive     GENTAMICIN <=1 SENSITIVE Sensitive     IMIPENEM 2 SENSITIVE Sensitive     PIP/TAZO 8 SENSITIVE Sensitive     CEFEPIME <=1 SENSITIVE Sensitive     * MODERATE PSEUDOMONAS AERUGINOSA         Radiology Studies: No results found.      Scheduled Meds: . amLODipine  10 mg Per Tube Daily  . aspirin  81 mg Per Tube Daily  . chlorhexidine gluconate (MEDLINE KIT)  15 mL Mouth Rinse BID  . Chlorhexidine Gluconate Cloth  6 each Topical Daily  . cloNIDine  0.2 mg Per Tube TID  . collagenase   Topical Daily  . enalapril  30 mg Per Tube Daily  . feeding supplement (PRO-STAT SUGAR FREE 64)  30 mL Per Tube BID  . feeding supplement (VITAL HIGH PROTEIN)  1,000 mL Per Tube Q24H  . free water  200 mL Per Tube Q8H  . furosemide  40 mg Per Tube BID  . hydrALAZINE  100 mg Per Tube Q8H  . insulin aspart  0-20 Units Subcutaneous Q4H  . insulin glargine  30 Units Subcutaneous  BID  . labetalol  400 mg Per Tube TID  . lacosamide  200 mg Per Tube BID  . levETIRAcetam  1,000 mg Per Tube BID  . mouth rinse  15 mL Mouth Rinse 10 times per day  . mupirocin cream   Topical Daily  . pantoprazole sodium  40 mg Per Tube QHS  . phenytoin  250 mg Per Tube Q6H  . potassium chloride  50 mEq Per Tube Daily  . thiamine  100 mg Per Tube Daily  . topiramate  200 mg Per Tube BID   Continuous Infusions: . sodium chloride 10 mL/hr at 09/01/17 0600  . ciprofloxacin Stopped (09/01/17 0503)  . heparin 1,200 Units/hr (09/01/17 0600)     LOS: 30 days    Time spent: 40 minutes    Malaysha Arlen, Geraldo Docker, MD [facility] Pager [phone]   If 7PM-7AM, please contact night-coverage [email or web address] Password [facility] 09/01/2017, 8:41 AM

## 2017-09-01 NOTE — Progress Notes (Signed)
ANTICOAGULATION CONSULT NOTE - Follow Up Consult  Pharmacy Consult for heparin Indication: DVT  Labs: Recent Labs    08/30/17 0406  08/31/17 0103 08/31/17 0923 08/31/17 1524 09/01/17 0420  HGB  --   --  10.1*  --   --  10.1*  HCT  --   --  31.3*  --   --  30.3*  PLT  --   --  327  --   --  346  HEPARINUNFRC  --    < > 0.22* 0.40 0.28* 0.45  CREATININE 1.20*  --  1.16*  --   --  1.14*   < > = values in this interval not displayed.    Assessment/Plan:  62yo female therapeutic on heparin after rate change. Will continue gtt at current rate and confirm stable with additional level.   Wynona Neat, PharmD, BCPS  09/01/2017,7:18 AM

## 2017-09-01 NOTE — Progress Notes (Signed)
Pt. on PS with RR in the upper 30s and low 40s. Suctioned pt. 3 times and called RT.

## 2017-09-01 NOTE — Progress Notes (Signed)
ANTICOAGULATION CONSULT NOTE - Follow Up Consult  Pharmacy Consult for heparin Indication: DVT  No Known Allergies  Patient Measurements: Height: 5\' 5"  (165.1 cm) Weight: 184 lb 4.9 oz (83.6 kg) IBW/kg (Calculated) : 57 Heparin Dosing Weight: 75.9kg  Vital Signs: Temp: 99 F (37.2 C) (02/23 1645) Temp Source: Oral (02/23 1645) BP: 131/58 (02/23 1652) Pulse Rate: 86 (02/23 1512)  Labs: Recent Labs    08/30/17 0406 08/31/17 0103  08/31/17 1524 09/01/17 0420 09/01/17 1445  HGB  --  10.1*  --   --  10.1*  --   HCT  --  31.3*  --   --  30.3*  --   PLT  --  327  --   --  346  --   HEPARINUNFRC  --  0.22*   < > 0.28* 0.45 0.36  CREATININE 1.20* 1.16*  --   --  1.14*  --    < > = values in this interval not displayed.    Estimated Creatinine Clearance: 56 mL/min (A) (by C-G formula based on SCr of 1.14 mg/dL (H)).   Assessment: 20 YOF with 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 She was discovered to have RUE DVT- neurologist weighed in and did not have an absolute contraindications to starting heparin for treatment.  -heparin level is now at goal on 1200 units/hr   Goal of Therapy:  Heparin level 0.3-0.5 units/ml Monitor platelets by anticoagulation protocol: Yes   Plan:  No heparin changes needed  Daily heparin level and CBC  Hildred Laser, Pharm D 09/01/2017 4:54 PM

## 2017-09-01 NOTE — Progress Notes (Signed)
Pt weaned on PS 5 and PEEP +5 for 2 hours. Placed back on previous PRVC settings d/t sustained RRs on the high 30s.

## 2017-09-02 DIAGNOSIS — I82401 Acute embolism and thrombosis of unspecified deep veins of right lower extremity: Secondary | ICD-10-CM

## 2017-09-02 DIAGNOSIS — E1165 Type 2 diabetes mellitus with hyperglycemia: Secondary | ICD-10-CM

## 2017-09-02 DIAGNOSIS — E118 Type 2 diabetes mellitus with unspecified complications: Secondary | ICD-10-CM

## 2017-09-02 LAB — HEPARIN LEVEL (UNFRACTIONATED): HEPARIN UNFRACTIONATED: 0.39 [IU]/mL (ref 0.30–0.70)

## 2017-09-02 LAB — CBC
HEMATOCRIT: 32.2 % — AB (ref 36.0–46.0)
HEMOGLOBIN: 10.3 g/dL — AB (ref 12.0–15.0)
MCH: 30.4 pg (ref 26.0–34.0)
MCHC: 32 g/dL (ref 30.0–36.0)
MCV: 95 fL (ref 78.0–100.0)
Platelets: 372 10*3/uL (ref 150–400)
RBC: 3.39 MIL/uL — AB (ref 3.87–5.11)
RDW: 15.7 % — ABNORMAL HIGH (ref 11.5–15.5)
WBC: 11.3 10*3/uL — ABNORMAL HIGH (ref 4.0–10.5)

## 2017-09-02 LAB — BASIC METABOLIC PANEL
ANION GAP: 11 (ref 5–15)
BUN: 53 mg/dL — ABNORMAL HIGH (ref 6–20)
CHLORIDE: 108 mmol/L (ref 101–111)
CO2: 18 mmol/L — ABNORMAL LOW (ref 22–32)
Calcium: 8.7 mg/dL — ABNORMAL LOW (ref 8.9–10.3)
Creatinine, Ser: 1.15 mg/dL — ABNORMAL HIGH (ref 0.44–1.00)
GFR calc Af Amer: 59 mL/min — ABNORMAL LOW (ref >60)
GFR, EST NON AFRICAN AMERICAN: 51 mL/min — AB (ref >60)
Glucose, Bld: 200 mg/dL — ABNORMAL HIGH (ref 65–99)
POTASSIUM: 3.8 mmol/L (ref 3.5–5.1)
SODIUM: 137 mmol/L (ref 135–145)

## 2017-09-02 LAB — GLUCOSE, CAPILLARY
GLUCOSE-CAPILLARY: 173 mg/dL — AB (ref 65–99)
GLUCOSE-CAPILLARY: 185 mg/dL — AB (ref 65–99)
GLUCOSE-CAPILLARY: 214 mg/dL — AB (ref 65–99)
GLUCOSE-CAPILLARY: 235 mg/dL — AB (ref 65–99)
Glucose-Capillary: 172 mg/dL — ABNORMAL HIGH (ref 65–99)
Glucose-Capillary: 180 mg/dL — ABNORMAL HIGH (ref 65–99)

## 2017-09-02 LAB — MAGNESIUM: MAGNESIUM: 2 mg/dL (ref 1.7–2.4)

## 2017-09-02 LAB — MRSA PCR SCREENING: MRSA BY PCR: NEGATIVE

## 2017-09-02 MED ORDER — FREE WATER
100.0000 mL | Freq: Three times a day (TID) | Status: DC
  Administered 2017-09-02 – 2017-09-04 (×6): 100 mL

## 2017-09-02 MED ORDER — INSULIN GLARGINE 100 UNIT/ML ~~LOC~~ SOLN
33.0000 [IU] | Freq: Two times a day (BID) | SUBCUTANEOUS | Status: DC
  Administered 2017-09-02 – 2017-09-03 (×2): 33 [IU] via SUBCUTANEOUS
  Filled 2017-09-02 (×3): qty 0.33

## 2017-09-02 MED ORDER — LORAZEPAM 2 MG/ML IJ SOLN: 1.0000 mg | INTRAMUSCULAR | Status: DC | PRN

## 2017-09-02 NOTE — Progress Notes (Signed)
ANTICOAGULATION CONSULT NOTE - Follow Up Consult  Pharmacy Consult for heparin Indication: DVT  No Known Allergies  Patient Measurements: Height: 5\' 5"  (165.1 cm) Weight: 189 lb 13.1 oz (86.1 kg) IBW/kg (Calculated) : 57 Heparin Dosing Weight: 75.9kg  Vital Signs: Temp: 99.7 F (37.6 C) (02/24 1155) Temp Source: Oral (02/24 1155) BP: 136/54 (02/24 1220) Pulse Rate: 86 (02/24 1220)  Labs: Recent Labs    08/31/17 0103  09/01/17 0420 09/01/17 1445 09/02/17 0315  HGB 10.1*  --  10.1*  --  10.3*  HCT 31.3*  --  30.3*  --  32.2*  PLT 327  --  346  --  372  HEPARINUNFRC 0.22*   < > 0.45 0.36 0.39  CREATININE 1.16*  --  1.14*  --  1.15*   < > = values in this interval not displayed.    Estimated Creatinine Clearance: 56.3 mL/min (A) (by C-G formula based on SCr of 1.15 mg/dL (H)).   Assessment: 14 YOF with 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 She was discovered to have RUE DVT- neurologist weighed in and did not have an absolute contraindications to starting heparin for treatment.  -heparin level remains at goal on 1200 units/hr  Of note: patient had repeat seizure activity today.    Goal of Therapy:  Heparin level 0.3-0.5 units/ml Monitor platelets by anticoagulation protocol: Yes   Plan:  Continue IV heparin at 1200 units/hr. Will avoid boluses  Daily heparin level and CBC  Albertina Parr, PharmD., BCPS Clinical Pharmacist Pager [phone]

## 2017-09-02 NOTE — Plan of Care (Signed)
  Not Progressing Skin Integrity: Risk for impaired skin integrity will decrease 09/02/2017 0245 - Not Progressing by Levonne Hubert, RN Note Multiple wounds present, daily dressing changes, q2 hr turns for off loading Respiratory: Ability to maintain a clear airway and adequate ventilation will improve 09/02/2017 0248 - Not Progressing by Levonne Hubert, RN 09/02/2017 0245 by Levonne Hubert, RN Note Requires frequent suctioning for secretions

## 2017-09-02 NOTE — Progress Notes (Addendum)
Call to DR Rory Percy ( neurology) Pt had seizure activity lasting approx 3 " . Noted that eyes were deviated up and to the right . Left arm was stiff and in decorticate position and tense. Pt already on her side- suctioned noted that minute ventilation was up to 22 . No weaning attempted at this time. Respiratory therapist  here also. Orders received for Ativan 1 mg IV if seizure activity lasts longer than 5 minutes

## 2017-09-02 NOTE — Progress Notes (Signed)
PROGRESS NOTE    [name]  XBJ:[id number] DOB: [birth year] DOA: 08/02/2017 PCP: Nolene Ebbs, MD   Brief Narrative:  62 year old BF PMHx polysubstance abuse (EtOH, Cocaine), Diabetes mellitus type II uncontrolled with complication, HTN, Insomnia disorder, Shortness of breath.   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  She was found down today in a puddle of stool and urine.  She brought to our department emergency medicine where she had overt clinical seizure activity.  She was treated with Keppra fosphenytoin and Ativan in the clinical seizures have subsided.  EEG does not show ongoing seizure activity but shows decreased activity throughout the left hemisphere suggesting a postictal state.  She is unable to give any history and remains obtunded at this time.  Imaging studies did not show a new structural lesion.  She was coughing up purulent material in the department of emergency medicine.      Subjective: 2/24  patient with positive witnessed seizures (stroke team notified). Eyes open unresponsive to painful stimuli     Assessment & Plan:   Active Problems:   Uncontrolled hypertension   Cerebrovascular accident (CVA) ([place])   Benign essential HTN   Status epilepticus (HCC)   Altered mental status   Cocaine abuse ([place])   Pressure injury of skin   Acute respiratory failure with hypoxemia (HCC)   Metabolic acidosis   Tracheostomy status (HCC)   Chronic diastolic CHF (congestive heart failure) (HCC)   Acute deep vein thrombosis (DVT) of right upper extremity (HCC)   Polysubstance abuse (EtOH, Cocaine)   CVA/Watershed infarct/Acute encephalopath -Patient now obtunded secondary to polysubstance abuse, noncompliance---> refractory seizures and CVA. -Extremely poor prognosis secondary significant brain injury. -See goals of care  Status epilepticus  -2/24 patient with breakthrough seizures early this  A.m. -Lacosamide 200 mg bid -Keppra 1000 mg bid -Ativan PRN breakthrough seizures.  Positive pseudomonas pneumonia -Completed course of treatment   Acute respiratory failure with hypoxemia -Vent dependent -Daily WUA/SBT: 2/24 no attempt wean today secondary to break through seizures   Chronic diastolic CHF -Per neurology target SBP<140 -Amlodipine 10 mg daily -Clonidine 0.2 mg TID -Enalapril 30 mg daily  -Hydralazine '100mg'$  TID -Labetalol 400 mg TID  Essential HTN  -see CHF    Hypernatremia  -2/24 decrease free water 100 ml TID   Hypokalemia -Potassium goal> 4  Hypomagnesemia -Magnesium goal> 2 -Magnesium IV 2 g  Anemia,    Acute RIGHT upper extremity DVT -Bilateral upper extremity swelling RIGHT>> LEFT. RIGHT upper extremity has PICC line. Will obtain bilateral upper extremity Doppler rule out DVT. -ADDENDUM: Positive R UE DVT. Spoke with neurology Dr. Rory Percy, and from a neurologic standpoint (thalamic hemorrhage November 2018), okay to start anticoagulation. Heparin per pharmacy.  Diabetes type 2 uncontrolled with complication -30/86/5784 Hemoglobin A1c= 14.6 -2/24 increase Lantus 33 units BID -Resistant SSI      Goals of care  -CSW currently working on placement after discussing with daughter options. Family would like patient to be placed close to family in the [place] area.   -2/13 PALLIATIVE CARE: Consult placed polysubstance abuse (EtOH, Cocaine), hemorrhagic stroke, severe brain injury, continues to require full vent support. Obtunded CODE STATUS change, hospice -2/14 palliative care attempted to contact family unsuccessfully. Given patient only has Medicaid of [place] very poor likelihood that we will be able to transfer patient to [place]. Spoke with Wynetta Emery from [place] will not except patient in LTAC or vent SNF.     DVT prophylaxis: SCD  Code Status: Full Family Communication: None Disposition Plan: TBD   Consultants:  Neurology [facility]  M    Procedures/Significant Events:  1/24 EEG:-Abnormal , evidence of moderate generalized slowing of brain activity as well as severe focal slowing in the left temporal lobe.  The patient is not in non-convulsive status epilepticus. 2D echo 1/25>>> EF 63-01%, grade 2 diastolic dysfunction  6/01 MRI brain  No acute intracranial abnormality. 2. 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 3. Remote hemorrhage in the left thalamus and left pons. 4. Age advanced atrophy. 5. Fluid in the nasopharynx and paranasal sinuses is likely secondary to intubation. 2/21 R UE duplex:- Positive for acute DVT subclavian vein, axillary vein and proximal brachial veins.        I have personally reviewed and interpreted all radiology studies and my findings are as above.  VENTILATOR SETTINGS: PRVC Vt set: 541m Set rate: 15 FiO2: 30% PEEP; 5 cm H2O    Cultures 1/30 Blood Cx negative 1/30 Respiratory positive Psuedomonas Aeruginosa    Antimicrobials: Anti-infectives (From admission, onward)   Start     Stop   08/17/17 1145  ceFAZolin (ANCEF) IVPB 1 g/50 mL premix     08/17/17 1144   08/17/17 1120  ceFAZolin (ANCEF) powder 1 g  Status:  Discontinued     08/17/17 1141   08/17/17 1119  ceFAZolin (ANCEF) IVPB 2g/100 mL premix  Status:  Discontinued     08/17/17 1132   08/17/17 1057  ceFAZolin (ANCEF) 2-4 GM/100ML-% IVPB    Comments:  BCamelia Phenes  : cabinet override   08/17/17 1131   08/17/17 0600  ceFAZolin (ANCEF) IVPB 2g/100 mL premix     08/17/17 0553   08/16/17 1530  ceFAZolin (ANCEF) IVPB 2g/100 mL premix  Status:  Discontinued     08/16/17 1526   08/08/17 2300  vancomycin (VANCOCIN) IVPB 750 mg/150 ml premix  Status:  Discontinued     08/10/17 0923   08/08/17 1400  piperacillin-tazobactam (ZOSYN) IVPB 3.375 g  Status:  Discontinued     08/14/17 0745   08/08/17 1100  vancomycin (VANCOCIN) 2,000 mg in sodium chloride 0.9 % 500 mL IVPB     08/08/17 1253        Devices    LINES / TUBES:  CVC 1/24>>>2/6 ETT 1/24>>>2/6 NG/OG 1/24>>> Tracheostomy 2/6>> PICC 2/6 >>     Continuous Infusions: . sodium chloride 10 mL/hr at 09/02/17 0400  . ciprofloxacin Stopped (09/02/17 1630)  . heparin 1,200 Units/hr (09/02/17 1430)     Objective: Vitals:   09/02/17 1500 09/02/17 1600 09/02/17 1621 09/02/17 1700  BP: (!) 129/47 (!) 160/78 (!) 160/78 (!) 135/59  Pulse: 86 90 88 88  Resp: (!) 25 (!) 29 (!) 24 (!) 27  Temp:      TempSrc:      SpO2: 97% 100% 100% 95%  Weight:      Height:        Intake/Output Summary (Last 24 hours) at 09/02/2017 1759 Last data filed at 09/02/2017 1630 Gross per 24 hour  Intake 1837.83 ml  Output 2100 ml  Net -262.17 ml   Filed Weights   08/31/17 0354 09/01/17 0500 09/02/17 0500  Weight: 192 lb 10.9 oz (87.4 kg) 184 lb 4.9 oz (83.6 kg) 189 lb 13.1 oz (86.1 kg)    Physical Exam:  General:  eyes open, unresponsive to painful stimuli , positive acute respiratory distress Eyes: eyes open spontaneously positive anisocoria, Neck:  Negative scars, masses, torticollis,  lymphadenopathy, JVD, #6 cuffed trach in place are clean negative sign of infection Lungs: Clear to auscultation bilaterally without wheezes or crackles Cardiovascular: Regular rate and rhythm without murmur gallop or rub normal S1 and S2 Abdomen: negative abdominal pain, nondistended, positive soft, bowel sounds, no rebound, no ascites, no appreciable mass, PEG tube in place negative sign of infection Extremities:  bilateral upper extremity swelling/cool to the touch RIGHT >> LEFT  Skin: Negative rashes, lesions, ulcers Psychiatric:  Unable to assess secondary to CVA Central nervous system:  Unable to assess secondary CVA       .     Data Reviewed: Care during the described time interval was provided by me .  I have reviewed this patient's available data, including medical history, events of note, physical examination, and all test  results as part of my evaluation.   CBC: Recent Labs  Lab 08/28/17 0355 08/29/17 0309 08/31/17 0103 09/01/17 0420 09/02/17 0315  WBC 12.1* 12.0* 13.0* 13.2* 11.3*  HGB 9.3* 9.7* 10.1* 10.1* 10.3*  HCT 29.4* 30.0* 31.3* 30.3* 32.2*  MCV 95.1 94.6 94.6 95.3 95.0  PLT 374 386 327 346 920   Basic Metabolic Panel: Recent Labs  Lab 08/29/17 0309 08/30/17 0406 08/31/17 0103 09/01/17 0420 09/02/17 0315  NA 138 137 135 137 137  K 3.5 3.8 3.8 3.7 3.8  CL 108 107 106 107 108  CO2 19* 18* 18* 17* 18*  GLUCOSE 142* 247* 222* 201* 200*  BUN 35* 45* 47* 51* 53*  CREATININE 0.93 1.20* 1.16* 1.14* 1.15*  CALCIUM 8.6* 8.7* 8.7* 8.7* 8.7*  MG  --   --  1.6* 1.6* 2.0   GFR: Estimated Creatinine Clearance: 56.3 mL/min (A) (by C-G formula based on SCr of 1.15 mg/dL (H)). Liver Function Tests: Recent Labs  Lab 08/27/17 0439 08/28/17 0355 08/31/17 0103  AST 34 36  --   ALT 25 25  --   ALKPHOS 73 71  --   BILITOT 0.4 0.3  --   PROT 6.7 6.5  --   ALBUMIN 1.6* 1.6* 1.8*   No results for input(s): LIPASE, AMYLASE in the last 168 hours. No results for input(s): AMMONIA in the last 168 hours. Coagulation Profile: No results for input(s): INR, PROTIME in the last 168 hours. Cardiac Enzymes: No results for input(s): CKTOTAL, CKMB, CKMBINDEX, TROPONINI in the last 168 hours. BNP (last 3 results) No results for input(s): PROBNP in the last 8760 hours. HbA1C: No results for input(s): HGBA1C in the last 72 hours. CBG: Recent Labs  Lab 09/02/17 0049 09/02/17 0346 09/02/17 0712 09/02/17 1154 09/02/17 1452  GLUCAP 214* 172* 185* 235* 173*   Lipid Profile: No results for input(s): CHOL, HDL, LDLCALC, TRIG, CHOLHDL, LDLDIRECT in the last 72 hours. Thyroid Function Tests: No results for input(s): TSH, T4TOTAL, FREET4, T3FREE, THYROIDAB in the last 72 hours. Anemia Panel: No results for input(s): VITAMINB12, FOLATE, FERRITIN, TIBC, IRON, RETICCTPCT in the last 72 hours. Urine  analysis:    Component Value Date/Time   COLORURINE YELLOW 08/05/2017 1216   APPEARANCEUR HAZY (A) 08/05/2017 1216   LABSPEC 1.017 08/05/2017 1216   PHURINE 5.0 08/05/2017 1216   GLUCOSEU NEGATIVE 08/05/2017 1216   GLUCOSEU > 1000 mg/dL (A) 11/01/2007 2056   HGBUR NEGATIVE 08/05/2017 1216   HGBUR negative 07/15/2007 0954   BILIRUBINUR NEGATIVE 08/05/2017 1216   KETONESUR NEGATIVE 08/05/2017 1216   PROTEINUR 30 (A) 08/05/2017 1216   UROBILINOGEN 1.0 04/09/2012 1524   NITRITE NEGATIVE 08/05/2017 1216  LEUKOCYTESUR TRACE (A) 08/05/2017 1216   Sepsis Labs: '@LABRCNTIP'$ (procalcitonin:4,lacticidven:4)  ) Recent Results (from the past 240 hour(s))  Culture, respiratory (NON-Expectorated)     Status: None   Collection Time: 08/24/17 10:19 AM  Result Value Ref Range Status   Specimen Description TRACHEAL ASPIRATE  Final   Special Requests NONE  Final   Gram Stain   Final    RARE WBC PRESENT, PREDOMINANTLY MONONUCLEAR RARE SQUAMOUS EPITHELIAL CELLS PRESENT FEW GRAM NEGATIVE RODS Performed at [facility], [place] [address] [place], [place] [address]    Culture MODERATE PSEUDOMONAS AERUGINOSA  Final   Report Status 08/26/2017 FINAL  Final   Organism ID, Bacteria PSEUDOMONAS AERUGINOSA  Final      Susceptibility   Pseudomonas aeruginosa - MIC*    CEFTAZIDIME 4 SENSITIVE Sensitive     CIPROFLOXACIN <=0.25 SENSITIVE Sensitive     GENTAMICIN <=1 SENSITIVE Sensitive     IMIPENEM 2 SENSITIVE Sensitive     PIP/TAZO 8 SENSITIVE Sensitive     CEFEPIME <=1 SENSITIVE Sensitive     * MODERATE PSEUDOMONAS AERUGINOSA  MRSA PCR Screening     Status: None   Collection Time: 09/01/17 11:11 PM  Result Value Ref Range Status   MRSA by PCR NEGATIVE NEGATIVE Final    Comment:        The GeneXpert MRSA Assay (FDA approved for NASAL specimens only), is one component of a comprehensive MRSA colonization surveillance program. It is not intended to diagnose MRSA infection nor to guide  or monitor treatment for MRSA infections. Performed at [facility], [place] [address] [place], [place] [address]          Radiology Studies: No results found.      Scheduled Meds: . amLODipine  10 mg Per Tube Daily  . aspirin  81 mg Per Tube Daily  . chlorhexidine gluconate (MEDLINE KIT)  15 mL Mouth Rinse BID  . cloNIDine  0.2 mg Per Tube TID  . collagenase   Topical Daily  . enalapril  30 mg Per Tube Daily  . feeding supplement (PRO-STAT SUGAR FREE 64)  30 mL Per Tube BID  . feeding supplement (VITAL HIGH PROTEIN)  1,000 mL Per Tube Q24H  . free water  200 mL Per Tube Q8H  . furosemide  40 mg Per Tube BID  . hydrALAZINE  100 mg Per Tube Q8H  . insulin aspart  0-20 Units Subcutaneous Q4H  . insulin glargine  30 Units Subcutaneous BID  . labetalol  400 mg Per Tube TID  . lacosamide  200 mg Per Tube BID  . levETIRAcetam  1,000 mg Per Tube BID  . mouth rinse  15 mL Mouth Rinse 10 times per day  . mupirocin cream   Topical Daily  . pantoprazole sodium  40 mg Per Tube QHS  . phenytoin  250 mg Per Tube Q6H  . potassium chloride  50 mEq Per Tube Daily  . thiamine  100 mg Per Tube Daily  . topiramate  200 mg Per Tube BID   Continuous Infusions: . sodium chloride 10 mL/hr at 09/02/17 0400  . ciprofloxacin Stopped (09/02/17 1630)  . heparin 1,200 Units/hr (09/02/17 1430)     LOS: 31 days    Time spent: 40 minutes    Taraneh Metheney, Geraldo Docker, MD [facility] Pager [phone]   If 7PM-7AM, please contact night-coverage [email or web address] Password [facility] 09/02/2017, 5:59 PM

## 2017-09-03 ENCOUNTER — Inpatient Hospital Stay (HOSPITAL_COMMUNITY): Payer: Medicaid Other

## 2017-09-03 DIAGNOSIS — R0902 Hypoxemia: Secondary | ICD-10-CM

## 2017-09-03 LAB — BASIC METABOLIC PANEL
ANION GAP: 11 (ref 5–15)
BUN: 54 mg/dL — ABNORMAL HIGH (ref 6–20)
CALCIUM: 8.7 mg/dL — AB (ref 8.9–10.3)
CO2: 17 mmol/L — ABNORMAL LOW (ref 22–32)
Chloride: 108 mmol/L (ref 101–111)
Creatinine, Ser: 1.1 mg/dL — ABNORMAL HIGH (ref 0.44–1.00)
GFR, EST NON AFRICAN AMERICAN: 53 mL/min — AB (ref >60)
Glucose, Bld: 206 mg/dL — ABNORMAL HIGH (ref 65–99)
Potassium: 4.1 mmol/L (ref 3.5–5.1)
SODIUM: 136 mmol/L (ref 135–145)

## 2017-09-03 LAB — GLUCOSE, CAPILLARY
GLUCOSE-CAPILLARY: 192 mg/dL — AB (ref 65–99)
GLUCOSE-CAPILLARY: 207 mg/dL — AB (ref 65–99)
Glucose-Capillary: 158 mg/dL — ABNORMAL HIGH (ref 65–99)
Glucose-Capillary: 160 mg/dL — ABNORMAL HIGH (ref 65–99)
Glucose-Capillary: 193 mg/dL — ABNORMAL HIGH (ref 65–99)
Glucose-Capillary: 198 mg/dL — ABNORMAL HIGH (ref 65–99)
Glucose-Capillary: 209 mg/dL — ABNORMAL HIGH (ref 65–99)

## 2017-09-03 LAB — CBC
HCT: 28.6 % — ABNORMAL LOW (ref 36.0–46.0)
Hemoglobin: 9.2 g/dL — ABNORMAL LOW (ref 12.0–15.0)
MCH: 30.6 pg (ref 26.0–34.0)
MCHC: 32.2 g/dL (ref 30.0–36.0)
MCV: 95 fL (ref 78.0–100.0)
PLATELETS: 282 10*3/uL (ref 150–400)
RBC: 3.01 MIL/uL — AB (ref 3.87–5.11)
RDW: 15.7 % — AB (ref 11.5–15.5)
WBC: 11.2 10*3/uL — AB (ref 4.0–10.5)

## 2017-09-03 LAB — MAGNESIUM: MAGNESIUM: 1.9 mg/dL (ref 1.7–2.4)

## 2017-09-03 LAB — HEPARIN LEVEL (UNFRACTIONATED)
HEPARIN UNFRACTIONATED: 0.27 [IU]/mL — AB (ref 0.30–0.70)
HEPARIN UNFRACTIONATED: 0.43 [IU]/mL (ref 0.30–0.70)

## 2017-09-03 MED ORDER — INSULIN GLARGINE 100 UNIT/ML ~~LOC~~ SOLN
36.0000 [IU] | Freq: Two times a day (BID) | SUBCUTANEOUS | Status: DC
  Administered 2017-09-03 – 2017-09-05 (×4): 36 [IU] via SUBCUTANEOUS
  Filled 2017-09-03 (×5): qty 0.36

## 2017-09-03 NOTE — Progress Notes (Signed)
CSW continuing to follow for vent SNF- called pt dtr and left message to get update on how gathering paperwork is going- awaiting return call  [name], [place] Social Worker [phone]

## 2017-09-03 NOTE — Progress Notes (Signed)
[place] for heparin Indication: DVT  No Known Allergies  Patient Measurements: Height: 5\' 5"  (165.1 cm) Weight: 185 lb 10 oz (84.2 kg) IBW/kg (Calculated) : 57 Heparin Dosing Weight: 75.9kg  Vital Signs: Temp: 98.8 F (37.1 C) (02/25 0800) Temp Source: Oral (02/25 0800) BP: 130/67 (02/25 0919) Pulse Rate: 80 (02/25 0800)  Labs: Recent Labs    09/01/17 0420  09/02/17 0315 09/03/17 0201 09/03/17 0938  HGB 10.1*  --  10.3* 9.2*  --   HCT 30.3*  --  32.2* 28.6*  --   PLT 346  --  372 282  --   HEPARINUNFRC 0.45   < > 0.39 0.27* 0.43  CREATININE 1.14*  --  1.15* 1.10*  --    < > = values in this interval not displayed.    Assessment: 62 YOF with 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 She was discovered to have RUE DVT.   Therapeutic on hep gtt  Goal of Therapy:  Heparin level 0.3-0.5 units/ml Monitor platelets by anticoagulation protocol: Yes   Plan:  -Continue heparin 1350 units/hr  -Daily HL, CBC  Levester Fresh, PharmD, BCPS, BCCCP Clinical Pharmacist Clinical phone for 09/03/2017 from 7a-3:30p: [phone] If after 3:30p, please call main pharmacy at: [phone] 09/03/2017 10:51 AM

## 2017-09-03 NOTE — Progress Notes (Signed)
PULMONARY / CRITICAL CARE MEDICINE   Name: [name] MRN: [id number] DOB: May 20, 1956    ADMISSION DATE:  08/02/2017 CONSULTATION DATE:  08/02/2017  CHIEF COMPLAINT:  Found down  HISTORY OF PRESENT ILLNESS:    62 year old woman with a complicated hospital course following watershed strokes and ischemic encephalopathy. Admitted for seizure activity.  She remains comatose.  She is ventilated currently, has a tracheostomy in place. Attempting ATC   SUBJECTIVE:  Currently on full vent support after having a seizure yesterday.  Now no longer seizing.   VITAL SIGNS: BP 140/73   Pulse 83   Temp 98.8 F (37.1 C) (Oral)   Resp (!) 24   Ht 5\' 5"  (1.651 m)   Wt 185 lb 10 oz (84.2 kg)   SpO2 100%   BMI 30.89 kg/m   HEMODYNAMICS:    VENTILATOR SETTINGS: Vent Mode: PRVC FiO2 (%):  [30 %] 30 % Set Rate:  [15 bmp] 15 bmp Vt Set:  [500 mL] 500 mL PEEP:  [5 cmH20-12 cmH20] 12 cmH20 Plateau Pressure:  [18 cmH20-19 cmH20] 19 cmH20  INTAKE / OUTPUT: I/O last 3 completed shifts: In: 2706.7 [I.V.:796.7; NG/GT:1310; IV Piggyback:600] Out: 3100 [Urine:3100]  PHYSICAL EXAMINATION:  General: Chronically ill appearing female, NAD HEENT: [place]/AT, PERRL, EOM-I and MMM Pulmonary: Coarse BS diffusely Cardiac: RRR, Nl S1/S2 and -M/R/G. Extremities/musculoskeletal: Generalized anasarca, generalized weakness Abdomen: Soft, NT, ND and +BS Neuro: Opens eyes, does not track, does not focus, does not follow commands  LABS:  BMET Recent Labs  Lab 09/01/17 0420 09/02/17 0315 09/03/17 0201  NA 137 137 136  K 3.7 3.8 4.1  CL 107 108 108  CO2 17* 18* 17*  BUN 51* 53* 54*  CREATININE 1.14* 1.15* 1.10*  GLUCOSE 201* 200* 206*   Electrolytes Recent Labs  Lab 09/01/17 0420 09/02/17 0315 09/03/17 0201  CALCIUM 8.7* 8.7* 8.7*  MG 1.6* 2.0 1.9   CBC Recent Labs  Lab 09/01/17 0420 09/02/17 0315 09/03/17 0201  WBC 13.2* 11.3* 11.2*  HGB 10.1* 10.3* 9.2*  HCT 30.3* 32.2* 28.6*   PLT 346 372 282   Coag's No results for input(s): APTT, INR in the last 168 hours. Sepsis Markers No results for input(s): LATICACIDVEN, PROCALCITON, O2SATVEN in the last 168 hours.  ABG No results for input(s): PHART, PCO2ART, PO2ART in the last 168 hours.  Liver Enzymes Recent Labs  Lab 08/28/17 0355 08/31/17 0103  AST 36  --   ALT 25  --   ALKPHOS 71  --   BILITOT 0.3  --   ALBUMIN 1.6* 1.8*   Cardiac Enzymes No results for input(s): TROPONINI, PROBNP in the last 168 hours.  Glucose Recent Labs  Lab 09/02/17 1452 09/02/17 1936 09/03/17 0027 09/03/17 0417 09/03/17 0734 09/03/17 1114  GLUCAP 173* 180* 193* 158* 209* 160*   Imaging Dg Chest Port 1 View  Result Date: 09/03/2017 CLINICAL DATA:  Respiratory failure, tracheostomy patient. History of CHF, diabetes, hypertension, substance abuse. EXAM: PORTABLE CHEST 1 VIEW COMPARISON:  Portable chest x-ray of August 29, 2017 FINDINGS: The lungs are adequately inflated. The interstitial markings remain increased in the mid and lower lungs. The cardiac silhouette is mildly enlarged. The pulmonary vascularity is normal. There is no pleural effusion. There is calcification in the wall of the aortic arch. The tracheostomy tube tip projects at the superior margin of the clavicular heads. IMPRESSION: Persistent subsegmental atelectasis bilaterally. No alveolar pneumonia nor pulmonary edema. Electronically Signed   By: David  Martinique M.D.  On: 09/03/2017 07:24   STUDIES:  EEG 1/24>>>This is an abnormal EEG.There is evidence of moderate generalized slowing of brain activity as well as severe focal slowing in the left temporal lobe. The patient is not in non-convulsive status epilepticus. 2D echo 1/25>>> EF 01-60%, grade 2 diastolic dysfunction  MRI brain 1/25>>> 1. No acute intracranial abnormality. 2. 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 3. Remote hemorrhage in the left thalamus and left pons. 4. Age  advanced atrophy. 5. Fluid in the nasopharynx and paranasal sinuses is likely secondary to intubation.  CULTURES: Blood Cx 1/30>>> Respiratory 1/30>>>Psuedomonas  2/15 sputum>>> pseudomonas   ANTIBIOTICS: Vancomycin 1/30>>> 1/31 Zosyn 1/30>>> 2/5 Cipro 2/17>>  LINES/TUBES: CVC 1/24>>>2/6 ETT 1/24>>>2/6 NG/OG 1/24>>> Tracheostomy 2/6>> PICC 2/6 >>  I reviewed CXR myself, trach is in good position.  ASSESSMENT / PLAN:  Acute respiratory failure with hypoxemia-Improving Tracheostomy status HTN Anemia, no bleeding  Pseudomonas Pneumonia  Hyperglycemia  Non-anion gap metabolic acidosis suspect due to hyperchloremia Watershed infarct/ischemic encephalopathy Acute encephalopathy Status epilepticus  Pulmonary problem 63 year old female who is tracheostomy and ventilator dependent setting of ischemic encephalopathy Course has been complicated by refractory status epilepticus, Pseudomonas pneumonia, and volume overload.   We have not been able to liberate her from the ventilator, she is not a candidate for decannulation. She has been unable to tolerate  ATC thus far due to RR. She most likely will need ventilator SNF. Bloody secretions have resolved  Chronic respiratory failure Hypoxemia Trach status  Plan/rec Placed patient on 10/5 and will attempt weaning today Follow-up chest x-ray intermittently  Diuresis as ordered Titrate O2 for sat of 88-92% Continue search for vent SNF to accept her  Palliative care following  Minimize suctioning Monitor CBC Maintain current trach size/type.  PCCM will continue to follow intermittently for trach/vent.   Rush Farmer, M.D. [facility] Pulmonary/Critical Care Medicine. Pager: [phone]. After hours pager: [phone].

## 2017-09-03 NOTE — Progress Notes (Signed)
Transported pt to room 11 while they work on room . Placed pt on full support during the transport once in room pt was placed back on the wean mode and doing well

## 2017-09-03 NOTE — Progress Notes (Signed)
ANTICOAGULATION CONSULT NOTE Pharmacy Consult for heparin Indication: DVT  No Known Allergies  Patient Measurements: Height: 5\' 5"  (165.1 cm) Weight: 189 lb 13.1 oz (86.1 kg) IBW/kg (Calculated) : 57 Heparin Dosing Weight: 75.9kg  Vital Signs: Temp: 99.3 F (37.4 C) (02/25 0000) Temp Source: Oral (02/25 0000) BP: 134/65 (02/25 0200) Pulse Rate: 86 (02/25 0200)  Labs: Recent Labs    09/01/17 0420 09/01/17 1445 09/02/17 0315 09/03/17 0201  HGB 10.1*  --  10.3* 9.2*  HCT 30.3*  --  32.2* 28.6*  PLT 346  --  372 282  HEPARINUNFRC 0.45 0.36 0.39 0.27*  CREATININE 1.14*  --  1.15*  --       Assessment: 61 YOF with 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 She was discovered to have RUE DVT. Patient had been therapeutic on this rate of heparin but this morning his level is now SUBtherapeutic. hgb slightly down to 9.2.   Goal of Therapy:  Heparin level 0.3-0.5 units/ml Monitor platelets by anticoagulation protocol: Yes    Plan:  -Increase heparin to 1350 units/hr  -Daily HL, CBC -Check 6 hour level   [name] 09/03/2017 2:54 AM

## 2017-09-03 NOTE — Progress Notes (Signed)
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 She was found down the day of this admit in a puddle of stool and urine, and brought to the ED where she had overt clinical seizure activity.She was treated with Keppra fosphenytoin and Ativan resulting in arrest of the seizures.    Significant Events: 1/24 admit - intubated 1/25 TTE - EF 55-60% - grade 2 DD 2/6 tracheostomy  2/8 PEG tube in IR  2/11 TRH assumed care  2/21 R UE venous duplex + for DVT subclavian, axillary, proximal brachial veins   Subjective: Non-communicative.  Pt had ~3 minutes of focal seizure activity yesterday.    Assessment & Plan:  Seizure - refractory status epilepticus  Due to non-compliance w/ seizure meds, hyperosmolar state, and cocaine use - potentially seizing for days upon arrival (had not been seen for 2 days prior to being found) - Neuro suggests no attempts to wean AEDs for at least 2 months - signif injury to brain thought to have occurred - no clinical evidence of any improvement at this time - remains fully non-communicative - no recurrence of seizure since 2/24  Pseudomonas pneumonia + tracheobronchitis  Completed an initial course of treatment - had been afebrile, then developed increased secretions and low grade fever again - f/u tracheal aspirate confirmed pan-sensitive Pseudomonas - resumed abx 2/17 >  Acute hypoxic respiratory failure Vent dependent - s/p trach - vent per PCCM - not making signif progress w/ weaning trials   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 in setting of HTN - refused SNF placement at that time  Polysubstance abuse - EtOH + Cocaine   Chronic diastolic CHF low grade anasarca persists, due to  albumin of 1.3 - cont diuretic and follow - wgt stable   Filed Weights   09/01/17 0500 09/02/17 0500 09/03/17 0402  Weight: 83.6 kg (184 lb 4.9 oz) 86.1 kg (189 lb 13.1 oz) 84.2 kg (185 lb 10 oz)    DM CBG variable - follow w/o change for now   HTN  BP presently well controlled   Hypernatremia  Corrected   Hypokalemia Corrected to goal of 4.0  Normocytic Anemia No evidence of acute blood loss - Hgb stable   Acute RIGHT upper extremity DVT R UE DVT noted on venous duplex (associated w/ PICC) > Heparin per pharmacy  DVT prophylaxis: IV heparin  Code Status: FULL CODE Family Communication: no family present at time of exam  Disposition Plan: will need a vent capable SNF - placement proving to be difficult - PCCM working on weaning off vent   Consultants:  Neurology PCCM  Antimicrobials:  Vancomycin 1/30 > 1/31 Zosyn 1/30 > 2/5 Cipro 2/17 >  Objective: Blood pressure 116/70, pulse 82, temperature 98.6 F (37 C), temperature source Oral, resp. rate (!) 23, height _0  (1.651 m), weight 84.2 kg (185 lb 10 oz), SpO2 100 %.  Intake/Output Summary (Last 24 hours) at 09/03/2017 1449 Last data filed at 09/03/2017 1400 Gross per 24 hour  Intake 1743.4 ml  Output 1400 ml  Net 343.4 ml   Filed Weights   09/01/17 0500 09/02/17 0500 09/03/17 0402  Weight: 83.6 kg (184 lb 4.9 oz) 86.1 kg (189 lb 13.1 oz) 84.2  kg (185 lb 10 oz)    Examination: General: No acute respiratory distress - obtunded  Lungs: CTA th/o  Cardiovascular: RRR w/o M   Abdomen: NT/ND, soft, BS+, no rebound - PEG insertion clean/dry  Extremities: 1+ anasarca w/o change - RUE 2+ edema   CBC: Recent Labs  Lab 08/29/17 0309 08/31/17 0103 09/01/17 0420 09/02/17 0315 09/03/17 0201  WBC 12.0* 13.0* 13.2* 11.3* 11.2*  HGB 9.7* 10.1* 10.1* 10.3* 9.2*  HCT 30.0* 31.3* 30.3* 32.2* 28.6*  MCV 94.6 94.6 95.3 95.0 95.0  PLT 386 327 346 372 782   Basic Metabolic Panel: Recent Labs  Lab 08/30/17 0406  08/31/17 0103 09/01/17 0420 09/02/17 0315 09/03/17 0201  NA 137 135 137 137 136  K 3.8 3.8 3.7 3.8 4.1  CL 107 106 107 108 108  CO2 18* 18* 17* 18* 17*  GLUCOSE 247* 222* 201* 200* 206*  BUN 45* 47* 51* 53* 54*  CREATININE 1.20* 1.16* 1.14* 1.15* 1.10*  CALCIUM 8.7* 8.7* 8.7* 8.7* 8.7*  MG  --  1.6* 1.6* 2.0 1.9   GFR: Estimated Creatinine Clearance: 58.3 mL/min (A) (by C-G formula based on SCr of 1.1 mg/dL (H)).  Liver Function Tests: Recent Labs  Lab 08/28/17 0355 08/31/17 0103  AST 36  --   ALT 25  --   ALKPHOS 71  --   BILITOT 0.3  --   PROT 6.5  --   ALBUMIN 1.6* 1.8*    HbA1C: Hgb A1c MFr Bld  Date/Time Value Ref Range Status  05/26/2017 10:35 PM 14.6 (H) 4.8 - 5.6 % Final    Comment:    (NOTE) Pre diabetes:          5.7%-6.4% Diabetes:              >6.4% Glycemic control for   <7.0% adults with diabetes   05/11/2017 08:08 AM 13.2 (H) 4.8 - 5.6 % Final    Comment:    (NOTE) Pre diabetes:          5.7%-6.4% Diabetes:              >6.4% Glycemic control for   <7.0% adults with diabetes     CBG: Recent Labs  Lab 09/02/17 1936 09/03/17 0027 09/03/17 0417 09/03/17 0734 09/03/17 1114  GLUCAP 180* 193* 158* 209* 160*    Scheduled Meds: . amLODipine  10 mg Per Tube Daily  . aspirin  81 mg Per Tube Daily  . chlorhexidine gluconate (MEDLINE KIT)  15 mL Mouth Rinse BID  . cloNIDine  0.2 mg Per Tube TID  . collagenase   Topical Daily  . enalapril  30 mg Per Tube Daily  . feeding supplement (PRO-STAT SUGAR FREE 64)  30 mL Per Tube BID  . feeding supplement (VITAL HIGH PROTEIN)  1,000 mL Per Tube Q24H  . free water  100 mL Per Tube Q8H  . furosemide  40 mg Per Tube BID  . hydrALAZINE  100 mg Per Tube Q8H  . insulin aspart  0-20 Units Subcutaneous Q4H  . insulin glargine  33 Units Subcutaneous BID  . labetalol  400 mg Per Tube TID  . lacosamide  200 mg Per Tube BID  . levETIRAcetam  1,000 mg Per Tube BID  . mouth rinse  15 mL Mouth Rinse 10 times  per day  . mupirocin cream   Topical Daily  . pantoprazole sodium  40 mg Per Tube QHS  . phenytoin  250 mg Per Tube Q6H  .  potassium chloride  50 mEq Per Tube Daily  . thiamine  100 mg Per Tube Daily  . topiramate  200 mg Per Tube BID     LOS: 40 days   Cherene Altes, MD [facility] Office  [phone] Pager - Text Page per Amion as per below:  On-Call/Text Page:      [email or web address]      password [email or web address]  If 7PM-7AM, please contact night-coverage [email or web address] Password [facility] 09/03/2017, 2:49 PM

## 2017-09-04 LAB — CBC
HCT: 30 % — ABNORMAL LOW (ref 36.0–46.0)
Hemoglobin: 9.6 g/dL — ABNORMAL LOW (ref 12.0–15.0)
MCH: 30.5 pg (ref 26.0–34.0)
MCHC: 32 g/dL (ref 30.0–36.0)
MCV: 95.2 fL (ref 78.0–100.0)
PLATELETS: 368 10*3/uL (ref 150–400)
RBC: 3.15 MIL/uL — ABNORMAL LOW (ref 3.87–5.11)
RDW: 15.9 % — AB (ref 11.5–15.5)
WBC: 12 10*3/uL — ABNORMAL HIGH (ref 4.0–10.5)

## 2017-09-04 LAB — BASIC METABOLIC PANEL
Anion gap: 10 (ref 5–15)
BUN: 58 mg/dL — AB (ref 6–20)
CO2: 19 mmol/L — ABNORMAL LOW (ref 22–32)
CREATININE: 1.11 mg/dL — AB (ref 0.44–1.00)
Calcium: 9 mg/dL (ref 8.9–10.3)
Chloride: 109 mmol/L (ref 101–111)
GFR, EST NON AFRICAN AMERICAN: 53 mL/min — AB (ref >60)
Glucose, Bld: 145 mg/dL — ABNORMAL HIGH (ref 65–99)
POTASSIUM: 4.2 mmol/L (ref 3.5–5.1)
SODIUM: 138 mmol/L (ref 135–145)

## 2017-09-04 LAB — GLUCOSE, CAPILLARY
GLUCOSE-CAPILLARY: 150 mg/dL — AB (ref 65–99)
Glucose-Capillary: 141 mg/dL — ABNORMAL HIGH (ref 65–99)
Glucose-Capillary: 146 mg/dL — ABNORMAL HIGH (ref 65–99)
Glucose-Capillary: 89 mg/dL (ref 65–99)
Glucose-Capillary: 113 mg/dL — ABNORMAL HIGH (ref 65–99)
Glucose-Capillary: 140 mg/dL — ABNORMAL HIGH (ref 65–99)

## 2017-09-04 LAB — HEPARIN LEVEL (UNFRACTIONATED): Heparin Unfractionated: 0.46 IU/mL (ref 0.30–0.70)

## 2017-09-04 MED ORDER — CHLORHEXIDINE GLUCONATE 0.12% ORAL RINSE (MEDLINE KIT)
15.0000 mL | Freq: Two times a day (BID) | OROMUCOSAL | Status: DC
  Administered 2017-09-04 – 2017-09-13 (×19): 15 mL via OROMUCOSAL

## 2017-09-04 MED ORDER — ORAL CARE MOUTH RINSE
15.0000 mL | Freq: Four times a day (QID) | OROMUCOSAL | Status: DC
  Administered 2017-09-04 – 2017-09-13 (×39): 15 mL via OROMUCOSAL

## 2017-09-04 MED ORDER — FREE WATER
200.0000 mL | Freq: Three times a day (TID) | Status: DC
  Administered 2017-09-04 – 2017-09-07 (×9): 200 mL

## 2017-09-04 MED ORDER — VITAL HIGH PROTEIN PO LIQD
1000.0000 mL | ORAL | Status: DC
  Administered 2017-09-05 – 2017-09-11 (×17): 1000 mL
  Filled 2017-09-04 (×8): qty 1000

## 2017-09-04 NOTE — Progress Notes (Signed)
CSW spoke with pt dtr to get update- she has friends who are helping to get paperwork from her mom's house that are needed for medicaid application- planning for phone call with SNF rep this afternoon to figure out fastest way to get paperwork to [place]   CSW will continue to follow  [name], [place] Social Worker [phone]

## 2017-09-04 NOTE — Consult Note (Signed)
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 With patient lateral rotation of both of the feet it is worrisome if the boots are not applied correctly each time that the boots will cause pressure on the foot bony prominences.  She has Prevalon boots in the room that allow for prevention of foot drop, ability to reposition the foot for lateral and medial foot rotation by use of boot wedge, and for complete offloading of the heel in the open space in the heel of the boot.  I have demonstrated use of the PRAFO to the staff but since she has the Prevalon boots in the room will DC the use of the PRAFOs for now and use Prevalon instead.   [facility] Nurse team will follow along with you for weekly wound assessments.  Please notify me of any acute changes in the wounds or any new areas of concerns [place] MSN, RN,CWOCN, [place], [place]

## 2017-09-04 NOTE — Progress Notes (Signed)
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 She was found down the day of this admit in a puddle of stool and urine, and brought to the ED where she had overt clinical seizure activity.She was treated with Keppra fosphenytoin and Ativan resulting in arrest of the seizures.    Significant Events: 1/24 admit - intubated 1/25 TTE - EF 55-60% - grade 2 DD 2/6 tracheostomy  2/8 PEG tube in IR  2/11 TRH assumed care  2/21 R UE venous duplex + for DVT subclavian, axillary, proximal brachial veins   Subjective: Doing well on [place] at present.  No evidence of distress or discomfort.      Assessment & Plan:  Seizure - refractory status epilepticus  Due to non-compliance w/ seizure meds, hyperosmolar state, and cocaine use - potentially seizing for days upon arrival (had not been seen for 2 days prior to being found) - Neuro suggests no attempts to wean AEDs for at least 2 months - signif injury to brain thought to have occurred - no clinical evidence of any improvement at this time - remains fully non-communicative - no recurrence of seizure since 2/24  Pseudomonas pneumonia + tracheobronchitis  Completed an initial course of treatment - had been afebrile, then developed increased secretions and low grade fever again - f/u tracheal aspirate confirmed pan-sensitive Pseudomonas - resumed abx 2/17 > 2/24 - no evidence of active infection at this time   Acute hypoxic respiratory failure Vent dependent - s/p trach - vent per PCCM - appears to now be making some progress in weaning   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 in setting of HTN - refused SNF placement at that time  Polysubstance abuse - EtOH + Cocaine    Chronic diastolic CHF low grade anasarca persists, due to albumin of 1.3 - cont diuretic and follow - wgt stable   Filed Weights   09/02/17 0500 09/03/17 0402 09/04/17 0500  Weight: 86.1 kg (189 lb 13.1 oz) 84.2 kg (185 lb 10 oz) 84.6 kg (186 lb 8.2 oz)    DM Cont to follow w/o change in tx plan   HTN  BP presently well controlled   Hypernatremia  Corrected   Hypokalemia Corrected to goal of 4.0  Normocytic Anemia No evidence of acute blood loss - Hgb stable   Acute RIGHT upper extremity DVT R UE DVT noted on venous duplex (associated w/ PICC) > Heparin per pharmacy  DVT prophylaxis: IV heparin  Code Status: FULL CODE Family Communication: no family present at time of exam  Disposition Plan: will need a vent capable SNF - placement proving to be difficult - PCCM working on weaning off vent   Consultants:  Neurology PCCM  Antimicrobials:  Vancomycin 1/30 > 1/31 Zosyn 1/30 > 2/5 Cipro 2/17 > 2/24  Objective: Blood pressure 123/64, pulse 83, temperature 98.8 F (37.1 C), temperature source Oral, resp. rate 18, height _0  (1.651 m), weight 84.6 kg (186 lb 8.2 oz), SpO2 100 %.  Intake/Output Summary (Last 24 hours) at 09/04/2017 1502 Last data filed at 09/04/2017 1400 Gross per 24 hour  Intake 1600.5 ml  Output 1700 ml  Net -99.5 ml   Filed Weights   09/02/17 0500 09/03/17 0402  09/04/17 0500  Weight: 86.1 kg (189 lb 13.1 oz) 84.2 kg (185 lb 10 oz) 84.6 kg (186 lb 8.2 oz)    Examination: General: No acute respiratory distress - on Tbar presently  Lungs: CTA th/o - no wheezing  Cardiovascular: RRR  Abdomen: NT/ND, soft, BS+ Extremities: 1+ anasarca - RUE 2+ edema   CBC: Recent Labs  Lab 08/31/17 0103 09/01/17 0420 09/02/17 0315 09/03/17 0201 09/04/17 0830  WBC 13.0* 13.2* 11.3* 11.2* 12.0*  HGB 10.1* 10.1* 10.3* 9.2* 9.6*  HCT 31.3* 30.3* 32.2* 28.6* 30.0*  MCV 94.6 95.3 95.0 95.0 95.2  PLT 327 346 372 282 341   Basic Metabolic Panel: Recent  Labs  Lab 08/31/17 0103 09/01/17 0420 09/02/17 0315 09/03/17 0201 09/04/17 0830  NA 135 137 137 136 138  K 3.8 3.7 3.8 4.1 4.2  CL 106 107 108 108 109  CO2 18* 17* 18* 17* 19*  GLUCOSE 222* 201* 200* 206* 145*  BUN 47* 51* 53* 54* 58*  CREATININE 1.16* 1.14* 1.15* 1.10* 1.11*  CALCIUM 8.7* 8.7* 8.7* 8.7* 9.0  MG 1.6* 1.6* 2.0 1.9  --    GFR: Estimated Creatinine Clearance: 57.9 mL/min (A) (by C-G formula based on SCr of 1.11 mg/dL (H)).  Liver Function Tests: Recent Labs  Lab 08/31/17 0103  ALBUMIN 1.8*    HbA1C: Hgb A1c MFr Bld  Date/Time Value Ref Range Status  05/26/2017 10:35 PM 14.6 (H) 4.8 - 5.6 % Final    Comment:    (NOTE) Pre diabetes:          5.7%-6.4% Diabetes:              >6.4% Glycemic control for   <7.0% adults with diabetes   05/11/2017 08:08 AM 13.2 (H) 4.8 - 5.6 % Final    Comment:    (NOTE) Pre diabetes:          5.7%-6.4% Diabetes:              >6.4% Glycemic control for   <7.0% adults with diabetes     CBG: Recent Labs  Lab 09/03/17 1925 09/03/17 2320 09/04/17 0324 09/04/17 0715 09/04/17 1113  GLUCAP 192* 207* 150* 141* 146*    Scheduled Meds: . amLODipine  10 mg Per Tube Daily  . aspirin  81 mg Per Tube Daily  . chlorhexidine gluconate (MEDLINE KIT)  15 mL Mouth Rinse BID  . cloNIDine  0.2 mg Per Tube TID  . collagenase   Topical Daily  . enalapril  30 mg Per Tube Daily  . [START ON 09/05/2017] feeding supplement (VITAL HIGH PROTEIN)  1,000 mL Per Tube Q24H  . free water  100 mL Per Tube Q8H  . furosemide  40 mg Per Tube BID  . hydrALAZINE  100 mg Per Tube Q8H  . insulin aspart  0-20 Units Subcutaneous Q4H  . insulin glargine  36 Units Subcutaneous BID  . labetalol  400 mg Per Tube TID  . lacosamide  200 mg Per Tube BID  . levETIRAcetam  1,000 mg Per Tube BID  . mouth rinse  15 mL Mouth Rinse QID  . mupirocin cream   Topical Daily  . pantoprazole sodium  40 mg Per Tube QHS  . phenytoin  250 mg Per Tube Q6H  .  potassium chloride  50 mEq Per Tube Daily  . thiamine  100 mg Per Tube Daily  . topiramate  200 mg Per Tube BID     LOS: 33 days  Cherene Altes, MD [facility] Office  [phone] Pager - Text Page per Amion as per below:  On-Call/Text Page:      [email or web address]      password [email or web address]  If 7PM-7AM, please contact night-coverage [email or web address] Password [facility] 09/04/2017, 3:02 PM

## 2017-09-04 NOTE — Progress Notes (Signed)
Nutrition Follow-up  DOCUMENTATION CODES:   Obesity unspecified  INTERVENTION:   Tube Feeding:  Vital High Protein @ 65 ml/hr D/C Pro-Stat Provides 137 g of protein, 1560 kcals, 1310 mL of free water   NUTRITION DIAGNOSIS:   Inadequate oral intake related to inability to eat as evidenced by NPO status.  Being addressed via TF   GOAL:   Patient will meet greater than or equal to 90% of their needs  Met  MONITOR:   Vent status, TF tolerance, Labs, Skin, Weight trends, I & O's  REASON FOR ASSESSMENT:   Consult Enteral/tube feeding initiation and management  ASSESSMENT:   62 yo female with PMH of DM, HTN, alcohol and cocaine abuse, L thalamic hemorrhage, insomnia disorder who was admitted on 1/24 after being found down; clinical seizures in the ED; required intubation in ED.  2/24 Witnessed seizures Pt remains on vent support via trach; attempting ATC MV: 10.1 L/min Temp (24hrs), Avg:98.6 F (37 C), Min:98 F (36.7 C), Max:99 F (37.2 C)  Tolerating Vital High Protein @ 40 ml/hr with Prostat 30 mL BID via PEG tube  Weight has trended down since admission, current wt 186 pounds, BMI 31. Pt weighed 194 pound son admission. Pt with multiple wounds. Feeding regimen adjusted to promote healing without overfeeding.   Labs: reviewed Meds: lasix, ss novolog, lantus, thiamine   Diet Order:  Seizure precautions Diet NPO time specified  EDUCATION NEEDS:   No education needs have been identified at this time  Skin:  Skin Assessment: Skin Integrity Issues: Skin Integrity Issues:: Stage II, Unstageable, Diabetic Ulcer Stage II: MDRPI to L lateral neck Unstageable: sacrum; MDRPI adjacent to FMS Diabetic Ulcer: R leg  Last BM:  2/26, rectal tube removed today (2/26)  Height:   Ht Readings from Last 1 Encounters:  08/02/17 _0  (1.651 m)    Weight:   Wt Readings from Last 1 Encounters:  09/04/17 186 lb 8.2 oz (84.6 kg)    Ideal Body Weight:  56.8  kg  BMI:  Body mass index is 31.04 kg/m.  Estimated Nutritional Needs:   Kcal:  </= 1677 kcals, >1200 kcals  Protein:  114-143 g   Fluid:  >/= 1.5 L  Kerman Passey MS, RD, LDN, CNSC [phone] Pager  [phone] Weekend/On-Call Pager

## 2017-09-04 NOTE — Progress Notes (Signed)
ANTICOAGULATION CONSULT NOTE Pharmacy Consult for heparin Indication: DVT  No Known Allergies  Patient Measurements: Height: 5\' 5"  (165.1 cm) Weight: 186 lb 8.2 oz (84.6 kg) IBW/kg (Calculated) : 57 Heparin Dosing Weight: 75.9kg  Vital Signs: Temp: 98.7 F (37.1 C) (02/26 0717) Temp Source: Oral (02/26 0717) BP: 137/70 (02/26 0900) Pulse Rate: 86 (02/26 0800)  Labs: Recent Labs    09/02/17 0315 09/03/17 0201 09/03/17 0938 09/04/17 0830  HGB 10.3* 9.2*  --   --   HCT 32.2* 28.6*  --   --   PLT 372 282  --   --   HEPARINUNFRC 0.39 0.27* 0.43 0.46  CREATININE 1.15* 1.10*  --  1.11*   Assessment: 45 YOF with 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 She was discovered to have RUE DVT. CBC stable, no overt bleeding documented.  Heparin level therapeutic: 0.46  Goal of Therapy:  Heparin level 0.3-0.5 units/ml Monitor platelets by anticoagulation protocol: Yes   Plan:  -Continue heparin 1350 units/hr  -Daily heparin level/CBC -Monitor for s/sx of bleeding  Georga Bora, PharmD Clinical Pharmacist 09/04/2017 10:02 AM

## 2017-09-05 ENCOUNTER — Other Ambulatory Visit: Payer: Self-pay

## 2017-09-05 ENCOUNTER — Other Ambulatory Visit: Payer: Self-pay | Admitting: Physical Medicine & Rehabilitation

## 2017-09-05 LAB — GLUCOSE, CAPILLARY
GLUCOSE-CAPILLARY: 52 mg/dL — AB (ref 65–99)
GLUCOSE-CAPILLARY: 54 mg/dL — AB (ref 65–99)
GLUCOSE-CAPILLARY: 89 mg/dL (ref 65–99)
GLUCOSE-CAPILLARY: 128 mg/dL — AB (ref 65–99)
Glucose-Capillary: 137 mg/dL — ABNORMAL HIGH (ref 65–99)
Glucose-Capillary: 118 mg/dL — ABNORMAL HIGH (ref 65–99)
Glucose-Capillary: 61 mg/dL — ABNORMAL LOW (ref 65–99)
Glucose-Capillary: 60 mg/dL — ABNORMAL LOW (ref 65–99)
Glucose-Capillary: 154 mg/dL — ABNORMAL HIGH (ref 65–99)
Glucose-Capillary: 155 mg/dL — ABNORMAL HIGH (ref 65–99)

## 2017-09-05 LAB — HEPARIN LEVEL (UNFRACTIONATED): Heparin Unfractionated: 0.39 IU/mL (ref 0.30–0.70)

## 2017-09-05 MED ORDER — DEXTROSE 50 % IV SOLN
INTRAVENOUS | Status: AC
  Administered 2017-09-05: 50 mL
  Filled 2017-09-05: qty 50

## 2017-09-05 MED ORDER — INFLUENZA VAC SPLIT QUAD 0.5 ML IM SUSY
0.5000 mL | PREFILLED_SYRINGE | INTRAMUSCULAR | Status: DC | PRN
Start: 2017-09-05 — End: 2017-09-14

## 2017-09-05 MED ORDER — INSULIN GLARGINE 100 UNIT/ML ~~LOC~~ SOLN
28.0000 [IU] | Freq: Two times a day (BID) | SUBCUTANEOUS | Status: DC
  Administered 2017-09-05 – 2017-09-11 (×12): 28 [IU] via SUBCUTANEOUS
  Filled 2017-09-05 (×13): qty 0.28

## 2017-09-05 MED ORDER — DEXTROSE 50 % IV SOLN
INTRAVENOUS | Status: AC
Start: 2017-09-05 — End: 2017-09-05
  Administered 2017-09-05: 50 mL
  Filled 2017-09-05: qty 50

## 2017-09-05 MED ORDER — SCOPOLAMINE 1 MG/3DAYS TD PT72
1.0000 | MEDICATED_PATCH | TRANSDERMAL | Status: DC
  Administered 2017-09-05 – 2017-09-14 (×4): 1.5 mg via TRANSDERMAL
  Filled 2017-09-05 (×4): qty 1

## 2017-09-05 NOTE — Progress Notes (Signed)
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 She was found down the day of this admit in a puddle of stool and urine, and brought to the ED where she had overt clinical seizure activity.She was treated with Keppra fosphenytoin and Ativan resulting in arrest of the seizures.    Significant Events: 1/24 admit - intubated 1/25 TTE - EF 55-60% - grade 2 DD 2/6 tracheostomy  2/8 PEG tube in IR  2/11 TRH assumed care  2/21 R UE venous duplex + for DVT subclavian, axillary, proximal brachial veins   Subjective: Pt remains non-communicative.  She is having some blood in her trach secretions today.  She is in no apparent resp distress or uncontrolled pain.  SLP has been working w/ her w/ a PMV today.    Assessment & Plan:  Seizure - refractory status epilepticus  Due to non-compliance w/ seizure meds, hyperosmolar state, and cocaine use - potentially seizing for days upon arrival (had not been seen for 2 days prior to being found) - Neuro suggests no attempts to wean AEDs for at least 2 months - signif injury to brain thought to have occurred - no clinical evidence of any improvement at this time - remains fully non-communicative - no recurrence of seizure since 2/24  Pseudomonas pneumonia + tracheobronchitis  Completed an initial course of treatment - had been afebrile, then developed increased secretions and low grade fever again - f/u tracheal aspirate confirmed pan-sensitive Pseudomonas - resumed abx 2/17 > 2/24 - no evidence of active infection at this time   Acute hypoxic respiratory failure Vent dependent - s/p trach - vent per PCCM - appears to now be making some progress in weaning   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 due to  cocaine abuse in setting of HTN - refused SNF placement at that time  Polysubstance abuse - EtOH + Cocaine   Chronic diastolic CHF low grade anasarca slowly improving - albumin of 1.3 - cont diuretic and follow - wgt trending downward  Filed Weights   09/03/17 0402 09/04/17 0500 09/05/17 0500  Weight: 84.2 kg (185 lb 10 oz) 84.6 kg (186 lb 8.2 oz) 83.8 kg (184 lb 11.9 oz)    DM Is having recurring hypoglycemia - decrease scheduled insulin and follow   HTN  BP controlled   Hypernatremia  Corrected   Hypokalemia Corrected to goal of 4.0  Normocytic Anemia No evidence of acute blood loss - Hgb stable   Acute RIGHT upper extremity DVT R UE DVT noted on venous duplex (associated w/ PICC) > Heparin per pharmacy  Minimal bright red blood via trach  Does not appear clinically significant at this time - cont heparin and follow   DVT prophylaxis: IV heparin  Code Status: FULL CODE Family Communication: no family present at time of exam  Disposition Plan: will need a vent capable SNF - placement proving to be difficult - PCCM working on weaning off vent   Consultants:  Neurology PCCM  Antimicrobials:  Vancomycin 1/30 > 1/31 Zosyn 1/30 > 2/5 Cipro 2/17 > 2/24  Objective: Blood pressure (!) 118/58, pulse 81, temperature 98.5 F (36.9 C), temperature source Oral, resp. rate 18, height '5\' 5"'$  (1.651 m), weight 83.8 kg (184 lb 11.9 oz),  SpO2 97 %.  Intake/Output Summary (Last 24 hours) at 09/05/2017 1547 Last data filed at 09/05/2017 1500 Gross per 24 hour  Intake 1403.25 ml  Output 3350 ml  Net -1946.75 ml   Filed Weights   09/03/17 0402 09/04/17 0500 09/05/17 0500  Weight: 84.2 kg (185 lb 10 oz) 84.6 kg (186 lb 8.2 oz) 83.8 kg (184 lb 11.9 oz)    Examination: General: No acute respiratory distress  Lungs: CTA th/o Cardiovascular: RRR  Abdomen: NT/ND, soft, BS+ Extremities: trace anasarca - RUE 1+ edema   CBC: Recent Labs  Lab 08/31/17 0103 09/01/17 0420  09/02/17 0315 09/03/17 0201 09/04/17 0830  WBC 13.0* 13.2* 11.3* 11.2* 12.0*  HGB 10.1* 10.1* 10.3* 9.2* 9.6*  HCT 31.3* 30.3* 32.2* 28.6* 30.0*  MCV 94.6 95.3 95.0 95.0 95.2  PLT 327 346 372 282 962   Basic Metabolic Panel: Recent Labs  Lab 08/31/17 0103 09/01/17 0420 09/02/17 0315 09/03/17 0201 09/04/17 0830  NA 135 137 137 136 138  K 3.8 3.7 3.8 4.1 4.2  CL 106 107 108 108 109  CO2 18* 17* 18* 17* 19*  GLUCOSE 222* 201* 200* 206* 145*  BUN 47* 51* 53* 54* 58*  CREATININE 1.16* 1.14* 1.15* 1.10* 1.11*  CALCIUM 8.7* 8.7* 8.7* 8.7* 9.0  MG 1.6* 1.6* 2.0 1.9  --    GFR: Estimated Creatinine Clearance: 57.6 mL/min (A) (by C-G formula based on SCr of 1.11 mg/dL (H)).  Liver Function Tests: Recent Labs  Lab 08/31/17 0103  ALBUMIN 1.8*    HbA1C: Hgb A1c MFr Bld  Date/Time Value Ref Range Status  05/26/2017 10:35 PM 14.6 (H) 4.8 - 5.6 % Final    Comment:    (NOTE) Pre diabetes:          5.7%-6.4% Diabetes:              >6.4% Glycemic control for   <7.0% adults with diabetes   05/11/2017 08:08 AM 13.2 (H) 4.8 - 5.6 % Final    Comment:    (NOTE) Pre diabetes:          5.7%-6.4% Diabetes:              >6.4% Glycemic control for   <7.0% adults with diabetes     CBG: Recent Labs  Lab 09/05/17 0350 09/05/17 0707 09/05/17 1103 09/05/17 1512 09/05/17 1536  GLUCAP 137* 118* 89 61* 60*    Scheduled Meds: . amLODipine  10 mg Per Tube Daily  . aspirin  81 mg Per Tube Daily  . chlorhexidine gluconate (MEDLINE KIT)  15 mL Mouth Rinse BID  . cloNIDine  0.2 mg Per Tube TID  . collagenase   Topical Daily  . enalapril  30 mg Per Tube Daily  . feeding supplement (VITAL HIGH PROTEIN)  1,000 mL Per Tube Q24H  . free water  200 mL Per Tube Q8H  . furosemide  40 mg Per Tube BID  . hydrALAZINE  100 mg Per Tube Q8H  . insulin aspart  0-20 Units Subcutaneous Q4H  . insulin glargine  36 Units Subcutaneous BID  . labetalol  400 mg Per Tube TID  . lacosamide  200 mg  Per Tube BID  . levETIRAcetam  1,000 mg Per Tube BID  . mouth rinse  15 mL Mouth Rinse QID  . mupirocin cream   Topical Daily  . pantoprazole sodium  40 mg Per Tube QHS  . phenytoin  250 mg Per Tube Q6H  . potassium chloride  50 mEq Per Tube Daily  . thiamine  100 mg Per Tube Daily  . topiramate  200 mg Per Tube BID     LOS: 82 days   Cherene Altes, MD [facility] Office  [phone] Pager - Text Page per Amion as per below:  On-Call/Text Page:      [email or web address]      password [email or web address]  If 7PM-7AM, please contact night-coverage [email or web address] Password [facility] 09/05/2017, 3:47 PM

## 2017-09-05 NOTE — Progress Notes (Signed)
PULMONARY / CRITICAL CARE MEDICINE   Name: [name] MRN: [id number] DOB: [birth year]    ADMISSION DATE:  08/02/2017 CONSULTATION DATE:  08/02/2017  CHIEF COMPLAINT:  Found down  HISTORY OF PRESENT ILLNESS:    62 year old woman with a complicated hospital course following watershed strokes and ischemic encephalopathy. Admitted for seizure activity.  She remains comatose.  She is ventilated currently, has a tracheostomy in place. Attempting ATC   SUBJECTIVE:  Tolerated TC overnight  VITAL SIGNS: BP 140/68   Pulse 81   Temp 98.4 F (36.9 C) (Oral)   Resp 20   Ht 5\' 5"  (1.651 m)   Wt 184 lb 11.9 oz (83.8 kg)   SpO2 98%   BMI 30.74 kg/m   HEMODYNAMICS:    VENTILATOR SETTINGS: Vent Mode: Stand-by FiO2 (%):  [28 %] 28 %  INTAKE / OUTPUT: I/O last 3 completed shifts: In: 2345 [I.V.:745; NG/GT:1600] Out: 4050 [Urine:4050]  PHYSICAL EXAMINATION:  General: Chronically ill appearing female, NAD HEENT: [place]/AT, PERRL, EOM-I and MMM Pulmonary: Coarse BS diffusely Cardiac: RRR, Nl S1/S2 and -M/R/G Extremities/musculoskeletal: Generalized anasarca Abdomen: Soft, NT, ND and +BS Neuro: Opens eyes, does not track, does not focus, does not follow commands  LABS:  BMET Recent Labs  Lab 09/02/17 0315 09/03/17 0201 09/04/17 0830  NA 137 136 138  K 3.8 4.1 4.2  CL 108 108 109  CO2 18* 17* 19*  BUN 53* 54* 58*  CREATININE 1.15* 1.10* 1.11*  GLUCOSE 200* 206* 145*   Electrolytes Recent Labs  Lab 09/01/17 0420 09/02/17 0315 09/03/17 0201 09/04/17 0830  CALCIUM 8.7* 8.7* 8.7* 9.0  MG 1.6* 2.0 1.9  --    CBC Recent Labs  Lab 09/02/17 0315 09/03/17 0201 09/04/17 0830  WBC 11.3* 11.2* 12.0*  HGB 10.3* 9.2* 9.6*  HCT 32.2* 28.6* 30.0*  PLT 372 282 368   Coag's No results for input(s): APTT, INR in the last 168 hours. Sepsis Markers No results for input(s): LATICACIDVEN, PROCALCITON, O2SATVEN in the last 168 hours.  ABG No results for input(s): PHART,  PCO2ART, PO2ART in the last 168 hours.  Liver Enzymes Recent Labs  Lab 08/31/17 0103  ALBUMIN 1.8*   Cardiac Enzymes No results for input(s): TROPONINI, PROBNP in the last 168 hours.  Glucose Recent Labs  Lab 09/04/17 2317 09/05/17 0318 09/05/17 0320 09/05/17 0350 09/05/17 0707 09/05/17 1103  GLUCAP 140* 52* 54* 137* 118* 89   Imaging No results found. STUDIES:  EEG 1/24>>>This is an abnormal EEG.There is evidence of moderate generalized slowing of brain activity as well as severe focal slowing in the left temporal lobe. The patient is not in non-convulsive status epilepticus. 2D echo 1/25>>> EF 37-10%, grade 2 diastolic dysfunction  MRI brain 1/25>>> 1. No acute intracranial abnormality. 2. 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 3. Remote hemorrhage in the left thalamus and left pons. 4. Age advanced atrophy. 5. Fluid in the nasopharynx and paranasal sinuses is likely secondary to intubation.  CULTURES: Blood Cx 1/30>>> Respiratory 1/30>>>Psuedomonas  2/15 sputum>>> pseudomonas   ANTIBIOTICS: Vancomycin 1/30>>> 1/31 Zosyn 1/30>>> 2/5 Cipro 2/17>>  LINES/TUBES: CVC 1/24>>>2/6 ETT 1/24>>>2/6 NG/OG 1/24>>> Tracheostomy 2/6>> PICC 2/6 >>  I reviewed CXR myself, trach is in a good position  ASSESSMENT / PLAN:  Acute respiratory failure with hypoxemia-Improving Tracheostomy status HTN Anemia, no bleeding  Pseudomonas Pneumonia  Hyperglycemia  Non-anion gap metabolic acidosis suspect due to hyperchloremia Watershed infarct/ischemic encephalopathy Acute encephalopathy Status epilepticus  Pulmonary problem 62 year old female who is  tracheostomy and ventilator dependent setting of ischemic encephalopathy Course has been complicated by refractory status epilepticus, Pseudomonas pneumonia, and volume overload.   We have not been able to liberate her from the ventilator, she is not a candidate for decannulation. She has been unable to  tolerate  ATC thus far due to RR. She most likely will need ventilator SNF. Bloody secretions have resolved Discussed with PCCM-NP.  Chronic respiratory failure Hypoxemia Trach status  Plan/rec Maintain on TC as tolerated If tolerating TC by Friday will consider change to a cuffless 6 CXR to PRN Diuresees Titrate O2 for sat of 88-92% Minimize suctioning Monitor CBC Maintain current trach size/type.  PCCM will continue to follow intermittently for trach/vent.   Rush Farmer, M.D. [facility] Pulmonary/Critical Care Medicine. Pager: [phone]. After hours pager: [phone].

## 2017-09-05 NOTE — Progress Notes (Signed)
ANTICOAGULATION CONSULT NOTE Pharmacy Consult for heparin Indication: DVT  No Known Allergies  Patient Measurements: Height: 5\' 5"  (165.1 cm) Weight: 184 lb 11.9 oz (83.8 kg) IBW/kg (Calculated) : 57 Heparin Dosing Weight: 75.9kg  Vital Signs: Temp: 98.4 F (36.9 C) (02/27 0716) Temp Source: Oral (02/27 0716) BP: 136/71 (02/27 0800) Pulse Rate: 87 (02/27 0800)  Labs: Recent Labs    09/03/17 0201 09/03/17 0938 09/04/17 0830 09/05/17 0413  HGB 9.2*  --  9.6*  --   HCT 28.6*  --  30.0*  --   PLT 282  --  368  --   HEPARINUNFRC 0.27* 0.43 0.46 0.39  CREATININE 1.10*  --  1.11*  --    Assessment: 67 YOF with 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 She was discovered to have RUE DVT. CBC stable, no overt bleeding documented.  Heparin level therapeutic  Goal of Therapy:  Heparin level 0.3-0.5 units/ml Monitor platelets by anticoagulation protocol: Yes   Plan:  -Continue heparin 1350 units/hr  -Daily heparin level/CBC -Monitor for s/sx of bleeding  Levester Fresh, PharmD, BCPS, BCCCP Clinical Pharmacist Clinical phone for 09/05/2017 from 7a-3:30p: [phone] If after 3:30p, please call main pharmacy at: [phone] 09/05/2017 9:03 AM

## 2017-09-05 NOTE — Evaluation (Signed)
[facility]y-[facility] Speaking Valve - Evaluation Patient Details  Name: [name] MRN: [id number] Date of Birth: 1956-07-03  Today's Date: 09/05/2017 Time: 1515-1530 SLP Time Calculation (min) (ACUTE ONLY): 15 min  Past Medical History:  Past Medical History:  Diagnosis Date  . Acute deep vein thrombosis (DVT) of right upper extremity ([place]) 08/30/2017  . Chronic diastolic CHF (congestive heart failure) ([place]) 08/30/2017  . Cocaine abuse, continuous ([place])   . Diabetes mellitus    Hb A1C = 12.6 on 05/15/11, managed on Novolog 70/30, 35 U qam, 25 U qpm  . Hypertension    poorly controlled  . Insomnia disorder   . Shortness of breath    Past Surgical History:  Past Surgical History:  Procedure Laterality Date  . ANKLE FRACTURE SURGERY  2007  . IR GASTROSTOMY TUBE MOD SED  08/17/2017   HPI:  This is a 62 year old diabetic with a history of alcohol and cocaine abuse who is previously been admitted to this hospital for a left thalamic hemorrhage. She was found down today in a puddle of stool and urine. She brought to our department emergency medicine where she had overt clinical seizure activity.She was treated with Keppra fosphenytoin and Ativan in the clinical seizures have subsided. EEG does not show ongoing seizure activity but shows decreased activity throughout the left hemisphere suggesting a postictal state. She is unable to give any history and remains obtunded at this time. Imaging studies did not show a new structural lesion. She was coughing up purulent material in the department of emergency medicine. 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 Pt intubated 1/24, trach placed 2/6 and has been on trach collar for last 24 hours. Pt currently has 43mm Shiley cuffed trach. Pt has PEG.    Assessment / Plan / Recommendation Clinical Impression  Pt evaluated for PMSV  toleration. Pt's cuff was already deflated when SLP entered room and no secretions seen at trach hub and no audible secretions. Pt with significant cognitive impairments and unable to follow any directions or indicate understanding. Pt tolerated finger occlusion with no decline in vitals and no evidence of air trapping. PMSV placed for 5 breath cycles and no evidence of air stacking or change in vitals. PMSV placed for ~ 1 minutes with signifcant increase in respiratory rate to 45. RR returned to previous baseline when SLP removed valve. Pt with mild impedence sound on exhalation but wasn't wet suspect pt with decreased upper airway patency after several breath cycles d/t neurological status, trach size and cuff. ST will continue to follow for PMSV toleration and request MD consider downsizing trach with medically appropriate.  SLP Visit Diagnosis: Aphonia (R49.1)    SLP Assessment  Patient needs continued Speech Lanaguage Pathology Services    Follow Up Recommendations  (TBD)    Frequency and Duration min 2x/week  2 weeks    PMSV Trial PMSV was placed for: 1 minute increments Able to redirect subglottic air through upper airway: Yes Able to Attain Phonation: No attempt to phonate Able to Expectorate Secretions: No attempts Respirations During Trial: (!) 45 SpO2 During Trial: 100 % Pulse During Trial: [facility]) Behavior: Alert   Tracheostomy Tube       Vent Dependency  FiO2 (%): 28 %    Cuff Deflation Trial  GO Tolerated Cuff Deflation: Yes(was deflated when SLP entered room)        Suhayb Anzalone 09/05/2017, 5:00 PM

## 2017-09-06 ENCOUNTER — Other Ambulatory Visit: Payer: Self-pay

## 2017-09-06 DIAGNOSIS — G9340 Encephalopathy, unspecified: Secondary | ICD-10-CM

## 2017-09-06 DIAGNOSIS — I82A11 Acute embolism and thrombosis of right axillary vein: Secondary | ICD-10-CM

## 2017-09-06 LAB — GLUCOSE, CAPILLARY
GLUCOSE-CAPILLARY: 151 mg/dL — AB (ref 65–99)
GLUCOSE-CAPILLARY: 148 mg/dL — AB (ref 65–99)
GLUCOSE-CAPILLARY: 179 mg/dL — AB (ref 65–99)
Glucose-Capillary: 85 mg/dL (ref 65–99)
Glucose-Capillary: 186 mg/dL — ABNORMAL HIGH (ref 65–99)
Glucose-Capillary: 159 mg/dL — ABNORMAL HIGH (ref 65–99)

## 2017-09-06 LAB — BASIC METABOLIC PANEL
Anion gap: 11 (ref 5–15)
BUN: 48 mg/dL — ABNORMAL HIGH (ref 6–20)
CALCIUM: 8.9 mg/dL (ref 8.9–10.3)
CO2: 20 mmol/L — AB (ref 22–32)
CREATININE: 1.06 mg/dL — AB (ref 0.44–1.00)
Chloride: 104 mmol/L (ref 101–111)
GFR calc non Af Amer: 56 mL/min — ABNORMAL LOW (ref >60)
Glucose, Bld: 156 mg/dL — ABNORMAL HIGH (ref 65–99)
Potassium: 4.1 mmol/L (ref 3.5–5.1)
Sodium: 135 mmol/L (ref 135–145)

## 2017-09-06 LAB — CBC
HCT: 32.2 % — ABNORMAL LOW (ref 36.0–46.0)
Hemoglobin: 10.5 g/dL — ABNORMAL LOW (ref 12.0–15.0)
MCH: 30.6 pg (ref 26.0–34.0)
MCHC: 32.6 g/dL (ref 30.0–36.0)
MCV: 93.9 fL (ref 78.0–100.0)
Platelets: 414 10*3/uL — ABNORMAL HIGH (ref 150–400)
RBC: 3.43 MIL/uL — AB (ref 3.87–5.11)
RDW: 15.7 % — ABNORMAL HIGH (ref 11.5–15.5)
WBC: 9.4 10*3/uL (ref 4.0–10.5)

## 2017-09-06 LAB — HEPARIN LEVEL (UNFRACTIONATED): HEPARIN UNFRACTIONATED: 0.42 [IU]/mL (ref 0.30–0.70)

## 2017-09-06 NOTE — Progress Notes (Addendum)
PROGRESS NOTE    [name]  BSJ:[id number] DOB: 1955/10/07 DOA: 08/02/2017 PCP: Nolene Ebbs, MD   Brief Narrative:  62 year old BF PMHx polysubstance abuse (EtOH, Cocaine), Diabetes mellitus type II uncontrolled with complication, HTN, Insomnia disorder, Shortness of breath.   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  She was found down today in a puddle of stool and urine.  She brought to our department emergency medicine where she had overt clinical seizure activity.  She was treated with Keppra fosphenytoin and Ativan in the clinical seizures have subsided.  EEG does not show ongoing seizure activity but shows decreased activity throughout the left hemisphere suggesting a postictal state.  She is unable to give any history and remains obtunded at this time.  Imaging studies did not show a new structural lesion.  She was coughing up purulent material in the department of emergency medicine.      Subjective: 2/28 eyes open unresponsive to painful stimuli     Assessment & Plan:   Active Problems:   Uncontrolled hypertension   Cerebrovascular accident (CVA) ([place])   Benign essential HTN   Status epilepticus (HCC)   Altered mental status   Cocaine abuse ([place])   Pressure injury of skin   Acute respiratory failure with hypoxemia (HCC)   Metabolic acidosis   Tracheostomy status (HCC)   Chronic diastolic CHF (congestive heart failure) (HCC)   Acute deep vein thrombosis (DVT) of right upper extremity (HCC)   Polysubstance abuse (EtOH, Cocaine)   CVA/Watershed infarct/Acute encephalopath -Patient now obtunded secondary to polysubstance abuse, noncompliance---> refractory seizures and CVA. -Extremely poor prognosis secondary significant brain injury. -See goals of care  Status epilepticus  -2/24 patient with breakthrough seizures early this A.m. -Lacosamide 200 mg bid -Keppra 1000 mg bid -Ativan PRN breakthrough  seizures.  Positive pseudomonas pneumonia -Completed course of treatment   Acute respiratory failure with hypoxemia -Vent dependent -Daily WUA/SBT: 2/24 no attempt wean today secondary to break through seizures   Chronic diastolic CHF -Per neurology target SBP<140 -Amlodipine 10 mg daily -Clonidine 0.2 mg TID -Enalapril 30 mg daily  -Hydralazine 119m TID -Labetalol 400 mg TID  Essential HTN  -see CHF    Hypernatremia  -2/24 decrease free water 100 ml TID   Hypokalemia -Potassium goal> 4  Hypomagnesemia -Magnesium goal> 2 -Magnesium IV 2 g  Anemia,    Acute RIGHT upper extremity DVT -Bilateral upper extremity swelling RIGHT>> LEFT. RIGHT upper extremity has PICC line. Will obtain bilateral upper extremity Doppler rule out DVT. -ADDENDUM: Positive R UE DVT. Spoke with neurology Dr. ARory Percy and from a neurologic standpoint (thalamic hemorrhage November 2018), okay to start anticoagulation. Heparin per pharmacy.  Diabetes type 2 uncontrolled with complication -176/54/6503Hemoglobin A1c= 14.6 -2/24 increase Lantus 33 units BID -Resistant SSI      Goals of care  -CSW currently working on placement after discussing with daughter options. Family [facility]   -2/13 PALLIATIVE CARE: Consult placed polysubstance abuse (EtOH, Cocaine), hemorrhagic stroke, severe brain injury, continues to require full vent support. Obtunded CODE STATUS change, hospice -2/14 palliative care attempted to contact family unsuccessfully. [place] or vent SNF.     DVT prophylaxis: SCD Code Status: Full Family Communication: None Disposition Plan: TBD  Consultants:  Neurology [facility]    Procedures/Significant Events:  1/24 EEG:-Abnormal , evidence of  moderate generalized slowing of brain activity as well as severe focal slowing in the left temporal lobe.  The patient is not in non-convulsive status epilepticus. 2D echo 1/25>>> EF 46-27%, grade 2 diastolic dysfunction  0/35 MRI brain  No acute intracranial abnormality. 2. 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 3. Remote hemorrhage in the left thalamus and left pons. 4. Age advanced atrophy. 5. Fluid in the nasopharynx and paranasal sinuses is likely secondary to intubation. 2/21 R UE duplex:- Positive for acute DVT subclavian vein, axillary vein and proximal brachial veins.        I have personally reviewed and interpreted all radiology studies and my findings are as above.  VENTILATOR SETTINGS: PRVC Vt set: 55m Set rate: 15 FiO2: 30% PEEP; 5 cm H2O    Cultures 1/30 Blood Cx negative 1/30 Respiratory positive Psuedomonas Aeruginosa    Antimicrobials: Anti-infectives (From admission, onward)   Start     Stop   08/17/17 1145  ceFAZolin (ANCEF) IVPB 1 g/50 mL premix     08/17/17 1144   08/17/17 1120  ceFAZolin (ANCEF) powder 1 g  Status:  Discontinued     08/17/17 1141   08/17/17 1119  ceFAZolin (ANCEF) IVPB 2g/100 mL premix  Status:  Discontinued     08/17/17 1132   08/17/17 1057  ceFAZolin (ANCEF) 2-4 GM/100ML-% IVPB    Comments:  BCamelia Phenes  : cabinet override   08/17/17 1131   08/17/17 0600  ceFAZolin (ANCEF) IVPB 2g/100 mL premix     08/17/17 0553   08/16/17 1530  ceFAZolin (ANCEF) IVPB 2g/100 mL premix  Status:  Discontinued     08/16/17 1526   08/08/17 2300  vancomycin (VANCOCIN) IVPB 750 mg/150 ml premix  Status:  Discontinued     08/10/17 0923   08/08/17 1400  piperacillin-tazobactam (ZOSYN) IVPB 3.375 g  Status:  Discontinued     08/14/17 0745   08/08/17 1100  vancomycin (VANCOCIN) 2,000 mg in sodium chloride 0.9 % 500 mL IVPB     08/08/17 1253       Devices    LINES / TUBES:  CVC 1/24>>>2/6 ETT 1/24>>>2/6 NG/OG  1/24>>> Tracheostomy 2/6>> PICC 2/6 >>     Continuous Infusions: . sodium chloride 10 mL/hr at 09/06/17 0800  . heparin 1,350 Units/hr (09/06/17 0800)     Objective: Vitals:   09/06/17 0611 09/06/17 0717 09/06/17 0726 09/06/17 0800  BP: 134/88  (!) 148/72 134/63  Pulse:   94   Resp:   (!) 21 (!) 26  Temp:  99.1 F (37.3 C)    TempSrc:  Oral    SpO2:    99%  Weight:      Height:        Intake/Output Summary (Last 24 hours) at 09/06/2017 0811 Last data filed at 09/06/2017 0800 Gross per 24 hour  Intake 2296.25 ml  Output 2625 ml  Net -328.75 ml   Filed Weights   09/04/17 0500 09/05/17 0500 09/06/17 0316  Weight: 186 lb 8.2 oz (84.6 kg) 184 lb 11.9 oz (83.8 kg) 188 lb 4.4 oz (85.4 kg)     Physical Exam:  General:  eyes open spontaneously, otherwise unresponsive , acute respiratory distress Eyes: eyes open spontaneously positive anisocoria, Neck:  Negative scars, masses, torticollis, lymphadenopathy, JVD, #6 cuffed trach in place are clean negative sign of infection Lungs: Clear to auscultation bilaterally without wheezes  or crackles Cardiovascular: Regular rate and rhythm without murmur gallop or rub normal S1 and S2 Abdomen: negative abdominal pain, nondistended, positive soft, bowel sounds, no rebound, no ascites, no appreciable mass, PEG tube in place negative sign of infection Extremities: No significant cyanosis, clubbing, or edema bilateral lower extremities Skin: Negative rashes, lesions, ulcers Psychiatric:  Unable to assess secondary to CVA Central nervous system:  Unable to assess secondary CVA       .     Data Reviewed: Care during the described time interval was provided by me .  I have reviewed this patient's available data, including medical history, events of note, physical examination, and all test results as part of my evaluation.   CBC: Recent Labs  Lab 09/01/17 0420 09/02/17 0315 09/03/17 0201 09/04/17 0830 09/06/17 0302  WBC 13.2*  11.3* 11.2* 12.0* 9.4  HGB 10.1* 10.3* 9.2* 9.6* 10.5*  HCT 30.3* 32.2* 28.6* 30.0* 32.2*  MCV 95.3 95.0 95.0 95.2 93.9  PLT 346 372 282 368 194*   Basic Metabolic Panel: Recent Labs  Lab 08/31/17 0103 09/01/17 0420 09/02/17 0315 09/03/17 0201 09/04/17 0830 09/06/17 0302  NA 135 137 137 136 138 135  K 3.8 3.7 3.8 4.1 4.2 4.1  CL 106 107 108 108 109 104  CO2 18* 17* 18* 17* 19* 20*  GLUCOSE 222* 201* 200* 206* 145* 156*  BUN 47* 51* 53* 54* 58* 48*  CREATININE 1.16* 1.14* 1.15* 1.10* 1.11* 1.06*  CALCIUM 8.7* 8.7* 8.7* 8.7* 9.0 8.9  MG 1.6* 1.6* 2.0 1.9  --   --    GFR: Estimated Creatinine Clearance: 60.9 mL/min (A) (by C-G formula based on SCr of 1.06 mg/dL (H)). Liver Function Tests: Recent Labs  Lab 08/31/17 0103  ALBUMIN 1.8*   No results for input(s): LIPASE, AMYLASE in the last 168 hours. No results for input(s): AMMONIA in the last 168 hours. Coagulation Profile: No results for input(s): INR, PROTIME in the last 168 hours. Cardiac Enzymes: No results for input(s): CKTOTAL, CKMB, CKMBINDEX, TROPONINI in the last 168 hours. BNP (last 3 results) No results for input(s): PROBNP in the last 8760 hours. HbA1C: No results for input(s): HGBA1C in the last 72 hours. CBG: Recent Labs  Lab 09/05/17 1648 09/05/17 1905 09/05/17 2313 09/06/17 0330 09/06/17 0715  GLUCAP 154* 128* 155* 151* 85   Lipid Profile: No results for input(s): CHOL, HDL, LDLCALC, TRIG, CHOLHDL, LDLDIRECT in the last 72 hours. Thyroid Function Tests: No results for input(s): TSH, T4TOTAL, FREET4, T3FREE, THYROIDAB in the last 72 hours. Anemia Panel: No results for input(s): VITAMINB12, FOLATE, FERRITIN, TIBC, IRON, RETICCTPCT in the last 72 hours. Urine analysis:    Component Value Date/Time   COLORURINE YELLOW 08/05/2017 1216   APPEARANCEUR HAZY (A) 08/05/2017 1216   LABSPEC 1.017 08/05/2017 1216   PHURINE 5.0 08/05/2017 1216   GLUCOSEU NEGATIVE 08/05/2017 1216   GLUCOSEU > 1000 mg/dL  (A) 11/01/2007 2056   HGBUR NEGATIVE 08/05/2017 1216   HGBUR negative 07/15/2007 0954   BILIRUBINUR NEGATIVE 08/05/2017 1216   KETONESUR NEGATIVE 08/05/2017 1216   PROTEINUR 30 (A) 08/05/2017 1216   UROBILINOGEN 1.0 04/09/2012 1524   NITRITE NEGATIVE 08/05/2017 1216   LEUKOCYTESUR TRACE (A) 08/05/2017 1216   Sepsis Labs: _0 (procalcitonin:4,lacticidven:4)  ) Recent Results (from the past 240 hour(s))  MRSA PCR Screening     Status: None   Collection Time: 09/01/17 11:11 PM  Result Value Ref Range Status   MRSA by PCR NEGATIVE NEGATIVE Final  Comment:        The GeneXpert MRSA Assay (FDA approved for NASAL specimens only), is one component of a comprehensive MRSA colonization surveillance program. It is not intended to diagnose MRSA infection nor to guide or monitor treatment for MRSA infections. Performed at [facility], [place] [address], [place], [place] [address]          Radiology Studies: No results found.      Scheduled Meds: . amLODipine  10 mg Per Tube Daily  . aspirin  81 mg Per Tube Daily  . chlorhexidine gluconate (MEDLINE KIT)  15 mL Mouth Rinse BID  . cloNIDine  0.2 mg Per Tube TID  . collagenase   Topical Daily  . enalapril  30 mg Per Tube Daily  . feeding supplement (VITAL HIGH PROTEIN)  1,000 mL Per Tube Q24H  . free water  200 mL Per Tube Q8H  . furosemide  40 mg Per Tube BID  . hydrALAZINE  100 mg Per Tube Q8H  . insulin aspart  0-20 Units Subcutaneous Q4H  . insulin glargine  28 Units Subcutaneous BID  . labetalol  400 mg Per Tube TID  . lacosamide  200 mg Per Tube BID  . levETIRAcetam  1,000 mg Per Tube BID  . mouth rinse  15 mL Mouth Rinse QID  . mupirocin cream   Topical Daily  . pantoprazole sodium  40 mg Per Tube QHS  . phenytoin  250 mg Per Tube Q6H  . potassium chloride  50 mEq Per Tube Daily  . scopolamine  1 patch Transdermal Q72H  . thiamine  100 mg Per Tube Daily  . topiramate  200 mg Per Tube BID    Continuous Infusions: . sodium chloride 10 mL/hr at 09/06/17 0800  . heparin 1,350 Units/hr (09/06/17 0800)     LOS: 35 days    Time spent: 40 minutes    Nayelis Bonito, Geraldo Docker, MD [facility] Pager [phone]   If 7PM-7AM, please contact night-coverage [email or web address] Password [facility] 09/06/2017, 8:11 AM

## 2017-09-06 NOTE — Progress Notes (Signed)
ANTICOAGULATION CONSULT NOTE Pharmacy Consult for heparin Indication: DVT  No Known Allergies  Patient Measurements: Height: 5\' 5"  (165.1 cm) Weight: 188 lb 4.4 oz (85.4 kg) IBW/kg (Calculated) : 57 Heparin Dosing Weight: 75.9kg  Vital Signs: Temp: 99.1 F (37.3 C) (02/28 0717) Temp Source: Oral (02/28 0717) BP: 148/72 (02/28 0726) Pulse Rate: 94 (02/28 0726)  Labs: Recent Labs    09/04/17 0830 09/05/17 0413 09/06/17 0302  HGB 9.6*  --  10.5*  HCT 30.0*  --  32.2*  PLT 368  --  414*  HEPARINUNFRC 0.46 0.39 0.42  CREATININE 1.11*  --  1.06*   Assessment: 3 YOF with 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 She was discovered to have RUE DVT. CBC stable, blood reported in her trach secretions. RN reports as a pink tinge and consistent with the providers findings yesterday. Instructed RN to notify pharmacy if bleeding gets worse.   Heparin level remains therapeutic: 0.42  Goal of Therapy:  Heparin level 0.3-0.5 units/ml Monitor platelets by anticoagulation protocol: Yes   Plan:  -Continue heparin 1350 units/hr  -Daily heparin level/CBC -Monitor for s/sx of bleeding  Georga Bora, PharmD Clinical Pharmacist 09/06/2017 7:55 AM

## 2017-09-06 NOTE — [place] FL2 (Signed)
[place] MEDICAID FL2 LEVEL OF CARE SCREENING TOOL     IDENTIFICATION  Patient Name: [name] Birthdate: 1955-07-27 Sex: female Admission Date (Current Location): 08/02/2017  [facility] and [place] Number:  Herbalist and Address:  [facility] [place], [place] [address], [place], [place] [address]      Provider Number: [id number]  Attending Physician Name and Address:  Allie Bossier, MD  Relative Name and Phone Number:  Royston Bake [phone]    Current Level of Care: Hospital Recommended Level of Care: [place] Prior Approval Number:    Date Approved/Denied:   PASRR Number:    Discharge Plan: SNF    Current Diagnoses: Patient Active Problem List   Diagnosis Date Noted  . Chronic diastolic CHF (congestive heart failure) ([place]) 08/30/2017  . Acute deep vein thrombosis (DVT) of right upper extremity ([place]) 08/30/2017  . Metabolic acidosis   . Tracheostomy status ([place])   . Acute respiratory failure with hypoxemia ([place])   . Pressure injury of skin 08/04/2017  . Altered mental status   . Cocaine abuse ([place])   . Status epilepticus ([place]) 08/02/2017  . H/O open leg wound 06/04/2017  . Open leg wound 06/04/2017  . Acute metabolic encephalopathy 37/04/6268  . Intracranial bleed ([place]) 05/26/2017  . ICH (intracerebral hemorrhage) ([place]) 05/26/2017  . Type II diabetes mellitus with renal manifestations ([place]) 05/25/2017  . SIRS (systemic inflammatory response syndrome) ([place]) 05/25/2017  . Aspiration pneumonia due to gastric secretions ([place])   . Confusion 05/10/2017  . Benign essential HTN   . Chronic hepatitis C without hepatic coma ([place])   . Labile blood glucose   . Hypoglycemia associated with type 2 diabetes mellitus ([place])   . Poorly controlled type 2 diabetes mellitus with peripheral neuropathy ([place])   . Neurologic gait disorder   . Neuropathic pain   . Type 2 diabetes mellitus with peripheral neuropathy (HCC)   .  History of intracranial hemorrhage   . Diarrhea   . Urinary incontinence   . Hypokalemia   . Polysubstance abuse ([place])   . Cerebrovascular accident (CVA) ([place])   . Disorientation   . Meningitis 11/02/2016  . Type 2 diabetes mellitus with hyperosmolar nonketotic hyperglycemia ([place]) 10/31/2016  . Seizure ([place]) 10/31/2016  . ARF (acute renal failure) ([place]) 10/31/2016  . Severe sepsis ([place]) 10/31/2016  . DKA (diabetic ketoacidoses) ([place]) 10/31/2016  . Acute renal failure superimposed on stage 3 chronic kidney disease ([place])   . Right hemiparesis ([place]) 06/09/2016  . 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 Cytotoxic brain edema ([place]) 06/08/2016  . Encephalopathy acute 06/06/2016  . Chest pain, musculoskeletal 05/16/2011  . Vaginal candidiasis 05/16/2011  . Diabetes mellitus, type 2 ([place]) 05/16/2011  . CHEST PAIN 10/01/2009  . CELLULITIS AND ABSCESS OF LEG EXCEPT FOOT 08/31/2009  . INSOMNIA 07/13/2008  . MRSA 04/17/2008  . NECK MASS 04/17/2008  . COCAINE ABUSE 11/01/2007  . OBESITY NOS 07/14/2006  . DENTAL CARIES 07/14/2006  . DIABETES MELLITUS, TYPE II 04/26/2006  . Alcohol abuse 04/26/2006  . TOBACCO ABUSE 04/26/2006  . DEPRESSION 04/26/2006  . Uncontrolled hypertension 04/26/2006  . GERD 04/26/2006  . LOW BACK PAIN 04/26/2006    Orientation RESPIRATION BLADDER Height & Weight     (intubated unable to assess. )  O2(55m shiley 28% FiO2) Incontinent Weight: 188 lb 4.4 oz (85.4 kg) Height:  '5\' 5"'$  (165.1 cm)  BEHAVIORAL SYMPTOMS/MOOD NEUROLOGICAL BOWEL NUTRITION STATUS      Continent Feeding  tube  AMBULATORY STATUS COMMUNICATION OF NEEDS Skin   Extensive Assist   Surgical wounds(trach )                       Personal Care Assistance Level of Assistance  Bathing, Feeding, Dressing Bathing Assistance: Maximum assistance Feeding assistance: Maximum assistance Dressing Assistance: Maximum assistance     Functional Limitations Info  Sight, Hearing, Speech Sight Info:  Adequate Hearing Info: Adequate Speech Info: Impaired(pt had trach placed. )    [place]  PT (By licensed PT), OT (By licensed OT), Speech therapy     PT Frequency: N/A OT Frequency: N/A     Speech Therapy Frequency: 5 times a week       Contractures Contractures Info: Not present    Additional Factors Info  Code Status, Allergies Code Status Info: Partial Code Allergies Info: NKA           Current Medications (09/06/2017):  This is the current hospital active medication list Current Facility-Administered Medications  Medication Dose Route Frequency Provider Last Rate Last Dose  . 0.45 % sodium chloride infusion   Intravenous Continuous Cherene Altes, MD 10 mL/hr at 09/06/17 0800    . acetaminophen (TYLENOL) solution 325 mg  325 mg Per Tube Q6H PRN Omar Person, NP   325 mg at 08/27/17 2355  . albuterol (PROVENTIL) (2.5 MG/3ML) 0.083% nebulizer solution 2.5 mg  2.5 mg Nebulization Q2H PRN Cherene Altes, MD   2.5 mg at 08/30/17 2031  . amLODipine (NORVASC) tablet 10 mg  10 mg Per Tube Daily Juanito Doom, MD   10 mg at 09/06/17 0955  . aspirin chewable tablet 81 mg  81 mg Per Tube Daily Cherene Altes, MD   81 mg at 09/06/17 0955  . chlorhexidine gluconate (MEDLINE KIT) (PERIDEX) 0.12 % solution 15 mL  15 mL Mouth Rinse BID Cherene Altes, MD   15 mL at 09/06/17 0804  . cloNIDine (CATAPRES) tablet 0.2 mg  0.2 mg Per Tube TID Allie Bossier, MD   0.2 mg at 09/06/17 1542  . docusate (COLACE) 50 MG/5ML liquid 50 mg  50 mg Per Tube BID PRN Joette Catching T, MD      . enalapril (VASOTEC) tablet 30 mg  30 mg Per Tube Daily Allie Bossier, MD   30 mg at 09/06/17 0951  . feeding supplement (VITAL HIGH PROTEIN) liquid 1,000 mL  1,000 mL Per Tube Q24H Cherene Altes, MD   1,000 mL at 09/06/17 1316  . fentaNYL (SUBLIMAZE) injection 50 mcg  50 mcg Intravenous Q1H PRN Cherene Altes, MD   50 mcg at 09/02/17 0305  . free water  200 mL  200 mL Per Tube Q8H Joette Catching T, MD   200 mL at 09/06/17 1400  . furosemide (LASIX) tablet 40 mg  40 mg Per Tube BID Cherene Altes, MD   40 mg at 09/06/17 0803  . heparin ADULT infusion 100 units/mL (25000 units/233m sodium chloride 0.45%)  1,350 Units/hr Intravenous Continuous MHarvel Quale RPH 13.5 mL/hr at 09/06/17 1617 1,350 Units/hr at 09/06/17 1617  . hydrALAZINE (APRESOLINE) tablet 100 mg  100 mg Per Tube Q8H MCherene Altes MD   100 mg at 09/06/17 1316  . Influenza vac split quadrivalent PF (FLUARIX) injection 0.5 mL  0.5 mL Intramuscular Prior to discharge MJoette CatchingT, MD      . insulin aspart (novoLOG) injection 0-20  Units  0-20 Units Subcutaneous Q4H Kathi Ludwig, MD   4 Units at 09/06/17 1541  . insulin glargine (LANTUS) injection 28 Units  28 Units Subcutaneous BID Cherene Altes, MD   28 Units at 09/06/17 1157  . labetalol (NORMODYNE) tablet 400 mg  400 mg Per Tube TID Joette Catching T, MD   400 mg at 09/06/17 1316  . labetalol (NORMODYNE,TRANDATE) injection 10 mg  10 mg Intravenous Q10 min PRN Rigoberto Noel, MD   10 mg at 09/03/17 0913  . lacosamide (VIMPAT) tablet 200 mg  200 mg Per Tube BID Cherene Altes, MD   200 mg at 09/06/17 0953  . levETIRAcetam (KEPPRA) 100 MG/ML solution 1,000 mg  1,000 mg Per Tube BID Cherene Altes, MD   1,000 mg at 09/06/17 0950  . LORazepam (ATIVAN) injection 1 mg  1 mg Intravenous Q4H PRN Cherene Altes, MD   1 mg at 08/28/17 2031  . LORazepam (ATIVAN) injection 1 mg  1 mg Intravenous PRN Amie Portland, MD      . MEDLINE mouth rinse  15 mL Mouth Rinse QID Cherene Altes, MD   15 mL at 09/06/17 1607  . mupirocin cream (BACTROBAN) 2 %   Topical Daily Sampson Goon, MD   1 application at 20/80/22 [phone]  . pantoprazole sodium (PROTONIX) 40 mg/20 mL oral suspension 40 mg  40 mg Per Tube QHS Juanito Doom, MD   40 mg at 09/05/17 2159  . phenytoin (DILANTIN) 125 MG/5ML suspension 250 mg   250 mg Per Tube Q6H Wynell Balloon, RPH   250 mg at 09/06/17 1158  . potassium chloride 20 MEQ/15ML (10%) solution 50 mEq  50 mEq Per Tube Daily Allie Bossier, MD   50 mEq at 09/06/17 0957  . scopolamine (TRANSDERM-SCOP) 1 MG/3DAYS 1.5 mg  1 patch Transdermal Q72H Cherene Altes, MD   1.5 mg at 09/05/17 1650  . sodium chloride flush (NS) 0.9 % injection 10-40 mL  10-40 mL Intracatheter PRN Juanito Doom, MD   10 mL at 08/29/17 0017  . thiamine (VITAMIN B-1) tablet 100 mg  100 mg Per Tube Daily Susa Raring, RPH   100 mg at 09/06/17 2244  . topiramate (TOPAMAX) tablet 200 mg  200 mg Per Tube BID Juanito Doom, MD   200 mg at 09/06/17 9753     Discharge Medications: Please see discharge summary for a list of discharge medications.  Relevant Imaging Results:  Relevant Lab Results:   Additional Information SSN [id number]. Pt had trach placed on 08/15/17  [name], LCSW

## 2017-09-06 NOTE — Progress Notes (Signed)
  Speech Language Pathology Treatment: [name] Speaking valve  Patient Details Name: [name] MRN: [id number] DOB: 1956-06-05 Today's Date: 09/06/2017 Time: 6578-4696 SLP Time Calculation (min) (ACUTE ONLY): 14 min  Assessment / Plan / Recommendation Clinical Impression  Pt was seen for skilled ST targeting PMSV treatment.  Pt remains unable to follow commands, is nonverbal, and has what appears to be right upper quadrant gaze preference with no eye contact made with therapist.  Oral care was completed to minimize bacterial load and risk of infection due to reports of significant tracheal secretions.  With finger occlusion and PMSV placement, pt was able to redirect air through the upper airway but with great difficulty as evidenced by increase in respiratory rate (43 at its highest and took ~1 minute to rebound) and increased use of accessory muscles.  Suspect secretions and mental state are primary limiting factors to valve toleration; however, size of trach and presence of cuff are also likely impeding upper airway patency as well.  Continue to recommend that PMSV only be used with SLP at this time.  Informed RN who was at bedside as therapist departed room.    HPI HPI: This is a 62 year old diabetic with a history of alcohol and cocaine abuse who is previously been admitted to this hospital for a left thalamic hemorrhage. She was found down today in a puddle of stool and urine. She brought to our department emergency medicine where she had overt clinical seizure activity.She was treated with Keppra fosphenytoin and Ativan in the clinical seizures have subsided. EEG does not show ongoing seizure activity but shows decreased activity throughout the left hemisphere suggesting a postictal state. She is unable to give any history and remains obtunded at this time. Imaging studies did not show a new structural lesion. She was coughing up purulent material in the department of emergency  medicine. 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 Pt intubated 1/24, trach placed 2/6 and has been on trach collar for last 24 hours. Pt currently has 89mm Shiley cuffed trach. Pt has PEG.       SLP Plan  Continue with current plan of care       Recommendations         Patient may use Passy-Muir Speech Valve: with SLP only PMSV Supervision: Full MD: Please consider changing trach tube to : Smaller size;Cuffless         Oral Care Recommendations: Oral care QID Follow up Recommendations: Other (comment)(To be determined) SLP Visit Diagnosis: Aphonia (R49.1) Plan: Continue with current plan of care       GO                [name], Selinda Orion 09/06/2017, 10:03 AM

## 2017-09-06 NOTE — Progress Notes (Signed)
Patient doing well on 28% trach collar- search initiated for trach SNF for possible local placement  CSW will continue to follow and work towards placement early next week if remains stable off trach collar over the weekend  [name], [place] Social Worker [phone]

## 2017-09-07 LAB — GLUCOSE, CAPILLARY
GLUCOSE-CAPILLARY: 76 mg/dL (ref 65–99)
GLUCOSE-CAPILLARY: 161 mg/dL — AB (ref 65–99)
GLUCOSE-CAPILLARY: 130 mg/dL — AB (ref 65–99)
GLUCOSE-CAPILLARY: 146 mg/dL — AB (ref 65–99)
GLUCOSE-CAPILLARY: 95 mg/dL (ref 65–99)

## 2017-09-07 LAB — HEPARIN LEVEL (UNFRACTIONATED): Heparin Unfractionated: 0.4 IU/mL (ref 0.30–0.70)

## 2017-09-07 MED ORDER — FREE WATER
100.0000 mL | Freq: Three times a day (TID) | Status: DC
  Administered 2017-09-07 – 2017-09-09 (×5): 100 mL

## 2017-09-07 NOTE — Procedures (Signed)
Tracheostomy Change Note  Patient Details:   Name: [name] DOB: April 16, 1956 MRN: [id number]    Airway Documentation:     Evaluation  O2 sats: stable throughout Complications: No apparent complications Patient did tolerate procedure well. Bilateral Breath Sounds: (P) Rhonchi, Diminished   Assisted CCM with change of trach to #6 shiley cuffless. Positive color change on etc02 post change. VS stable and no signs of distress noted.  Elwin Mocha 09/07/2017, 1:44 PM

## 2017-09-07 NOTE — Progress Notes (Signed)
ANTICOAGULATION CONSULT NOTE Pharmacy Consult for heparin Indication: DVT  No Known Allergies  Patient Measurements: Height: 5\' 5"  (165.1 cm) Weight: 179 lb 3.7 oz (81.3 kg) IBW/kg (Calculated) : 57 Heparin Dosing Weight: 75.9kg  Vital Signs: Temp: 98.5 F (36.9 C) (03/01 0310) Temp Source: Oral (03/01 0310) BP: 141/68 (03/01 0631) Pulse Rate: 84 (03/01 0631)  Labs: Recent Labs    09/04/17 0830 09/05/17 0413 09/06/17 0302 09/07/17 0358  HGB 9.6*  --  10.5*  --   HCT 30.0*  --  32.2*  --   PLT 368  --  414*  --   HEPARINUNFRC 0.46 0.39 0.42 0.40  CREATININE 1.11*  --  1.06*  --    Assessment: 72 YOF with 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 She was discovered to have RUE DVT. RN reports increased bleeding around trach. Per MD, okay to continue IV heparin for now and monitor closely.   Heparin level remains therapeutic: 0.4  Goal of Therapy:  Heparin level 0.3-0.5 units/ml Monitor platelets by anticoagulation protocol: Yes   Plan:  -Continue heparin 1350 units/hr  -Daily heparin level/CBC -Monitor for s/sx of bleeding  Albertina Parr, PharmD., BCPS Clinical Pharmacist Pager [phone]

## 2017-09-07 NOTE — Progress Notes (Signed)
SLP Cancellation Note  Patient Details Name: [name] MRN: [id number] DOB: [birth year]   Cancelled treatment:       Reason Eval/Treat Not Completed: Medical issues which prohibited therapy. RN reports that pt has blood at her trach site today, recommends we hold PMV trials for now. Will f/u likely early next week for additional trials.   [name] 09/07/2017, 11:18 AM   [name], M.A. CCC-SLP [phone]

## 2017-09-07 NOTE — Progress Notes (Addendum)
PULMONARY / CRITICAL CARE MEDICINE   Name: [name] MRN: [id number] DOB: 30-Apr-1956    ADMISSION DATE:  08/02/2017 CONSULTATION DATE:  08/02/2017  CHIEF COMPLAINT:  Found down  HISTORY OF PRESENT ILLNESS:    62 year old woman with a complicated hospital course following watershed strokes and ischemic encephalopathy. Admitted for seizure activity.  She remains comatose.  She is ventilated currently, has a tracheostomy in place. Attempting ATC   SUBJECTIVE:  Tolerated TC x around the clock x 2 days Strong cough Some bloody secretions noted 3/1 early am Heparin level is within goal range of 0.3-0.5 units per ml ( 0.4) WD to pain, upward gaze  VITAL SIGNS: BP (!) 103/55   Pulse 83   Temp 98.8 F (37.1 C) (Oral)   Resp 19   Ht 5\' 5"  (1.651 m)   Wt 179 lb 3.7 oz (81.3 kg)   SpO2 98%   BMI 29.83 kg/m   HEMODYNAMICS:    VENTILATOR SETTINGS: FiO2 (%):  [28 %] 28 %  INTAKE / OUTPUT: I/O last 3 completed shifts: In: 3483.9 [I.V.:917; NG/GT:2566.9] Out: 2775 [Urine:2775]  PHYSICAL EXAMINATION:  General: Chronically ill appearing female, NAD, on TC HEENT: [place]/AT, PERRL, EOM-I and MMM, #6 trach secure and intact Pulmonary: Coarse BS diffusely, Bilateral chest excursion Cardiac: RRR, Nl S1/S2 and -M/R/G Extremities/musculoskeletal: Generalized anasarca, arm and foot splints on each extremity Abdomen: Soft, NT, ND and +BS Neuro: Opens eyes, upward gaze, does not track, does not focus, does not follow commands, WD vs decorticate  to pain  LABS:  BMET Recent Labs  Lab 09/03/17 0201 09/04/17 0830 09/06/17 0302  NA 136 138 135  K 4.1 4.2 4.1  CL 108 109 104  CO2 17* 19* 20*  BUN 54* 58* 48*  CREATININE 1.10* 1.11* 1.06*  GLUCOSE 206* 145* 156*   Electrolytes Recent Labs  Lab 09/01/17 0420 09/02/17 0315 09/03/17 0201 09/04/17 0830 09/06/17 0302  CALCIUM 8.7* 8.7* 8.7* 9.0 8.9  MG 1.6* 2.0 1.9  --   --    CBC Recent Labs  Lab 09/03/17 0201  09/04/17 0830 09/06/17 0302  WBC 11.2* 12.0* 9.4  HGB 9.2* 9.6* 10.5*  HCT 28.6* 30.0* 32.2*  PLT 282 368 414*   Coag's No results for input(s): APTT, INR in the last 168 hours. Sepsis Markers No results for input(s): LATICACIDVEN, PROCALCITON, O2SATVEN in the last 168 hours.  ABG No results for input(s): PHART, PCO2ART, PO2ART in the last 168 hours.  Liver Enzymes No results for input(s): AST, ALT, ALKPHOS, BILITOT, ALBUMIN in the last 168 hours. Cardiac Enzymes No results for input(s): TROPONINI, PROBNP in the last 168 hours.  Glucose Recent Labs  Lab 09/06/17 1521 09/06/17 1928 09/06/17 2303 09/07/17 0311 09/07/17 0734 09/07/17 1050  GLUCAP 159* 148* 179* 76 161* 130*   Imaging No results found. STUDIES:  EEG 1/24>>>This is an abnormal EEG.There is evidence of moderate generalized slowing of brain activity as well as severe focal slowing in the left temporal lobe. The patient is not in non-convulsive status epilepticus. 2D echo 1/25>>> EF 78-29%, grade 2 diastolic dysfunction  MRI brain 1/25>>> 1. No acute intracranial abnormality. 2. 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 3. Remote hemorrhage in the left thalamus and left pons. 4. Age advanced atrophy. 5. Fluid in the nasopharynx and paranasal sinuses is likely secondary to intubation.  CULTURES: Blood Cx 1/30>>> No growth Respiratory 1/30>>>Psuedomonas  2/15 sputum>>> pseudomonas   ANTIBIOTICS: Vancomycin 1/30>>> 1/31 Zosyn 1/30>>> 2/5 Cipro 2/17>>  LINES/TUBES: CVC 1/24>>>2/6 ETT 1/24>>>2/6 NG/OG 1/24>>> Tracheostomy 2/6>> PICC 2/6 >>    ASSESSMENT / PLAN:  Acute respiratory failure with hypoxemia-Improving Tracheostomy status HTN Anemia, no bleeding  Pseudomonas Pneumonia  Hyperglycemia  Non-anion gap metabolic acidosis suspect due to hyperchloremia Watershed infarct/ischemic encephalopathy Acute encephalopathy Status epilepticus  Pulmonary problem 62 year old  female who is tracheostomy and ventilator dependent setting of ischemic encephalopathy Course has been complicated by refractory status epilepticus, Pseudomonas pneumonia, and volume overload.   Slow  to liberate her from the ventilator, she is not a candidate for decannulation.  She has been able  to tolerate  ATC 28% x 48 hours. Bloody secretions  again 3/1 overnight  Chronic respiratory failure Hypoxemia Trach status  Plan/rec Maintain on TC as tolerated>> 28% Change trach  to a cuffless 6>> at bedside to be done 3/1 by Dr. Nelda Marseille CXR to PRN Continue Diuresis Titrate O2 for sat of 88-92% Minimize suctioning Monitor CBC Trach care per unit protocol  PCCM will continue to follow intermittently for trach/vent.   Magdalen Spatz, AGACNP-BC [place]. Pager: [phone].  09/07/2017 11:53 AM  Attending Note:  62 year old female who is tracheostomy dependent in the setting of ischemic encephalopathy.  On exam, patient is completely unresponsive and not following commands.  I reviewed CXR myself, trach is in good position.  Discussed with PCCM-NP.  Chronic respiratory failure:  - Monitor for airway protection.  - Suction as needed.  Hypoxemia:   - Titrate O2 for sat of 88-92%  Trach status:  - Change from cuffed to cuffless trach size 6  - Patient is never to be decannualted to downsized beyond this change given neuro status  PCCM will sign off, please call back if needed.  Patient seen and examined, agree with above note.  I dictated the care and orders written for this patient under my direction.  Rush Farmer, [place]

## 2017-09-07 NOTE — Procedures (Signed)
First Trach Change  Size 6 cuffed trach removed over a changer and 6 cuffless trach placed with good color change and bilateral air movement.  Rush Farmer, M.D. [facility] Pulmonary/Critical Care Medicine. Pager: [phone]. After hours pager: [phone].

## 2017-09-07 NOTE — Progress Notes (Signed)
PROGRESS NOTE    [name]  QIW:[id number] DOB: 05/10/1956 DOA: 08/02/2017 PCP: Nolene Ebbs, MD   Brief Narrative:  62 year old BF PMHx polysubstance abuse (EtOH, Cocaine), Diabetes mellitus type II uncontrolled with complication, HTN, Insomnia disorder, Shortness of breath.   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  She was found down today in a puddle of stool and urine.  She brought to our department emergency medicine where she had overt clinical seizure activity.  She was treated with Keppra fosphenytoin and Ativan in the clinical seizures have subsided.  EEG does not show ongoing seizure activity but shows decreased activity throughout the left hemisphere suggesting a postictal state.  She is unable to give any history and remains obtunded at this time.  Imaging studies did not show a new structural lesion.  She was coughing up purulent material in the department of emergency medicine.      Subjective: 3/1 eyes open unresponsive to painful stimuli     Assessment & Plan:   Active Problems:   Uncontrolled hypertension   Cerebrovascular accident (CVA) ([place])   Benign essential HTN   Status epilepticus (HCC)   Altered mental status   Cocaine abuse ([place])   Pressure injury of skin   Acute respiratory failure with hypoxemia (HCC)   Metabolic acidosis   Tracheostomy status (HCC)   Chronic diastolic CHF (congestive heart failure) (HCC)   Acute deep vein thrombosis (DVT) of right upper extremity (HCC)   Polysubstance abuse (EtOH, Cocaine)   CVA/[place] infarct/Acute encephalopath -Patient now obtunded secondary to polysubstance abuse, noncompliance---> refractory seizures and CVA. -Extremely poor prognosis secondary significant brain injury. -See goals of care  Status epilepticus  -2/24 patient with breakthrough seizures early this A.m. -Lacosamide 200 mg bid -Keppra 1000 mg bid -Ativan PRN breakthrough  seizures.  Positive pseudomonas pneumonia -Completed course of treatment   Acute respiratory failure with hypoxemia -Vent dependent -Daily WUA/SBT:  -3/1 #6 cuffed--> #6 cuffless: Patient is never to be decannualted to downsized beyond this change given neuro status  -3/1 [facility] M has signed off   Chronic diastolic CHF -Per neurology target SBP<140 -Amlodipine 10 mg daily -Clonidine 0.2 mg TID -Enalapril 30 mg daily  -Hydralazine '100mg'$  TID -Labetalol 400 mg TID  Essential HTN  -see CHF    Hypernatremia  -3/1 decrease Free water 100 ml TID   Hypokalemia -Potassium goal> 4  Hypomagnesemia -Magnesium goal> 2  Anemia,    Acute RIGHT upper extremity DVT -Bilateral upper extremity swelling RIGHT>> LEFT. RIGHT upper extremity has PICC line. Will obtain bilateral upper extremity Doppler rule out DVT. -ADDENDUM: Positive R UE DVT. Spoke with neurology Dr. Rory Percy, and from a neurologic standpoint (thalamic hemorrhage November 2018), okay to start anticoagulation. Heparin per pharmacy.  Diabetes type 2 uncontrolled with complication -41/74/0814 Hemoglobin A1c= 14.6 -2/24 increase Lantus 33 units BID -Resistant SSI      Goals of care  -CSW currently working on placement after discussing with daughter options. Family would like patient to be placed close to family in the [facility] area.   -2/13 PALLIATIVE CARE: Consult placed polysubstance abuse (EtOH, Cocaine), hemorrhagic stroke, severe brain injury, continues to require full vent support. Obtunded CODE STATUS change, hospice -2/14 palliative care attempted to contact family unsuccessfully. Given patient only has Medicaid of [place] very poor likelihood that we will be able to transfer patient to [place]. Spoke with Wynetta Emery from [place] will not except patient in LTAC or vent SNF.  DVT prophylaxis: SCD Code Status: Full Family Communication: None Disposition Plan: TBD   Consultants:  Neurology [facility]  M    Procedures/Significant Events:  1/24 EEG:-Abnormal , evidence of moderate generalized slowing of brain activity as well as severe focal slowing in the left temporal lobe.  The patient is not in non-convulsive status epilepticus. 2D echo 1/25>>> EF 09-38%, grade 2 diastolic dysfunction  1/82 MRI brain  No acute intracranial abnormality. 2. 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 3. Remote hemorrhage in the left thalamus and left pons. 4. Age advanced atrophy. 5. Fluid in the nasopharynx and paranasal sinuses is likely secondary to intubation. 2/21 R UE duplex:- Positive for acute DVT subclavian vein, axillary vein and proximal brachial veins.        I have personally reviewed and interpreted all radiology studies and my findings are as above.  VENTILATOR SETTINGS: PRVC Vt set: 53m Set rate: 15 FiO2: 30% PEEP; 5 cm H2O    Cultures 1/30 Blood Cx negative 1/30 Respiratory positive Psuedomonas Aeruginosa    Antimicrobials: Anti-infectives (From admission, onward)   Start     Stop   08/17/17 1145  ceFAZolin (ANCEF) IVPB 1 g/50 mL premix     08/17/17 1144   08/17/17 1120  ceFAZolin (ANCEF) powder 1 g  Status:  Discontinued     08/17/17 1141   08/17/17 1119  ceFAZolin (ANCEF) IVPB 2g/100 mL premix  Status:  Discontinued     08/17/17 1132   08/17/17 1057  ceFAZolin (ANCEF) 2-4 GM/100ML-% IVPB    Comments:  BCamelia Phenes  : cabinet override   08/17/17 1131   08/17/17 0600  ceFAZolin (ANCEF) IVPB 2g/100 mL premix     08/17/17 0553   08/16/17 1530  ceFAZolin (ANCEF) IVPB 2g/100 mL premix  Status:  Discontinued     08/16/17 1526   08/08/17 2300  vancomycin (VANCOCIN) IVPB 750 mg/150 ml premix  Status:  Discontinued     08/10/17 0923   08/08/17 1400  piperacillin-tazobactam (ZOSYN) IVPB 3.375 g  Status:  Discontinued     08/14/17 0745   08/08/17 1100  vancomycin (VANCOCIN) 2,000 mg in sodium chloride 0.9 % 500 mL IVPB     08/08/17 1253        Devices    LINES / TUBES:  CVC 1/24>>>2/6 ETT 1/24>>>2/6 NG/OG 1/24>>> Tracheostomy 2/6>> PICC 2/6 >> #6 cuffless 3/1>>    Continuous Infusions: . sodium chloride 10 mL/hr at 09/07/17 0600  . heparin 1,350 Units/hr (09/07/17 0600)     Objective: Vitals:   09/07/17 0945 09/07/17 1000 09/07/17 1053 09/07/17 1100  BP: 116/61 123/67  (!) 103/55  Pulse:      Resp:  (!) 23  19  Temp:   98.8 F (37.1 C)   TempSrc:   Oral   SpO2:  100%  98%  Weight:      Height:        Intake/Output Summary (Last 24 hours) at 09/07/2017 1150 Last data filed at 09/07/2017 1100 Gross per 24 hour  Intake 2076.42 ml  Output 1550 ml  Net 526.42 ml   Filed Weights   09/05/17 0500 09/06/17 0316 09/07/17 0500  Weight: 184 lb 11.9 oz (83.8 kg) 188 lb 4.4 oz (85.4 kg) 179 lb 3.7 oz (81.3 kg)     Physical Exam:  General: Obtunded, acute respiratory distress Eyes: eyes open spontaneously positive anisocoria, Neck:  Negative scars, masses, torticollis, lymphadenopathy, JVD, #6 cuffless trach in place are clean negative sign  of infection Lungs: Clear to auscultation bilaterally without wheezes or crackles Cardiovascular: Regular rate and rhythm without murmur gallop or rub normal S1 and S2 Abdomen: negative abdominal pain, nondistended, positive soft, bowel sounds, no rebound, no ascites, no appreciable mass, PEG tube in place negative sign of infection Extremities: No significant cyanosis, clubbing, or edema bilateral lower extremities Skin: Negative rashes, lesions, ulcers Psychiatric:  Unable to assess secondary to CVA Central nervous system:  Unable to assess secondary CVA       .     Data Reviewed: Care during the described time interval was provided by me .  I have reviewed this patient's available data, including medical history, events of note, physical examination, and all test results as part of my evaluation.   CBC: Recent Labs  Lab 09/01/17 0420 09/02/17 0315  09/03/17 0201 09/04/17 0830 09/06/17 0302  WBC 13.2* 11.3* 11.2* 12.0* 9.4  HGB 10.1* 10.3* 9.2* 9.6* 10.5*  HCT 30.3* 32.2* 28.6* 30.0* 32.2*  MCV 95.3 95.0 95.0 95.2 93.9  PLT 346 372 282 368 585*   Basic Metabolic Panel: Recent Labs  Lab 09/01/17 0420 09/02/17 0315 09/03/17 0201 09/04/17 0830 09/06/17 0302  NA 137 137 136 138 135  K 3.7 3.8 4.1 4.2 4.1  CL 107 108 108 109 104  CO2 17* 18* 17* 19* 20*  GLUCOSE 201* 200* 206* 145* 156*  BUN 51* 53* 54* 58* 48*  CREATININE 1.14* 1.15* 1.10* 1.11* 1.06*  CALCIUM 8.7* 8.7* 8.7* 9.0 8.9  MG 1.6* 2.0 1.9  --   --    GFR: Estimated Creatinine Clearance: 59.4 mL/min (A) (by C-G formula based on SCr of 1.06 mg/dL (H)). Liver Function Tests: No results for input(s): AST, ALT, ALKPHOS, BILITOT, PROT, ALBUMIN in the last 168 hours. No results for input(s): LIPASE, AMYLASE in the last 168 hours. No results for input(s): AMMONIA in the last 168 hours. Coagulation Profile: No results for input(s): INR, PROTIME in the last 168 hours. Cardiac Enzymes: No results for input(s): CKTOTAL, CKMB, CKMBINDEX, TROPONINI in the last 168 hours. BNP (last 3 results) No results for input(s): PROBNP in the last 8760 hours. HbA1C: No results for input(s): HGBA1C in the last 72 hours. CBG: Recent Labs  Lab 09/06/17 1928 09/06/17 2303 09/07/17 0311 09/07/17 0734 09/07/17 1050  GLUCAP 148* 179* 76 161* 130*   Lipid Profile: No results for input(s): CHOL, HDL, LDLCALC, TRIG, CHOLHDL, LDLDIRECT in the last 72 hours. Thyroid Function Tests: No results for input(s): TSH, T4TOTAL, FREET4, T3FREE, THYROIDAB in the last 72 hours. Anemia Panel: No results for input(s): VITAMINB12, FOLATE, FERRITIN, TIBC, IRON, RETICCTPCT in the last 72 hours. Urine analysis:    Component Value Date/Time   COLORURINE YELLOW 08/05/2017 1216   APPEARANCEUR HAZY (A) 08/05/2017 1216   LABSPEC 1.017 08/05/2017 1216   PHURINE 5.0 08/05/2017 1216   GLUCOSEU NEGATIVE  08/05/2017 1216   GLUCOSEU > 1000 mg/dL (A) 11/01/2007 2056   HGBUR NEGATIVE 08/05/2017 1216   HGBUR negative 07/15/2007 0954   BILIRUBINUR NEGATIVE 08/05/2017 1216   KETONESUR NEGATIVE 08/05/2017 1216   PROTEINUR 30 (A) 08/05/2017 1216   UROBILINOGEN 1.0 04/09/2012 1524   NITRITE NEGATIVE 08/05/2017 1216   LEUKOCYTESUR TRACE (A) 08/05/2017 1216   Sepsis Labs: '@LABRCNTIP'$ (procalcitonin:4,lacticidven:4)  ) Recent Results (from the past 240 hour(s))  MRSA PCR Screening     Status: None   Collection Time: 09/01/17 11:11 PM  Result Value Ref Range Status   MRSA by PCR NEGATIVE NEGATIVE Final  Comment:        The GeneXpert MRSA Assay (FDA approved for NASAL specimens only), is one component of a comprehensive MRSA colonization surveillance program. It is not intended to diagnose MRSA infection nor to guide or monitor treatment for MRSA infections. Performed at [facility], [place] [address] [place],  [address]          Radiology Studies: No results found.      Scheduled Meds: . amLODipine  10 mg Per Tube Daily  . aspirin  81 mg Per Tube Daily  . chlorhexidine gluconate (MEDLINE KIT)  15 mL Mouth Rinse BID  . cloNIDine  0.2 mg Per Tube TID  . enalapril  30 mg Per Tube Daily  . feeding supplement (VITAL HIGH PROTEIN)  1,000 mL Per Tube Q24H  . free water  200 mL Per Tube Q8H  . furosemide  40 mg Per Tube BID  . hydrALAZINE  100 mg Per Tube Q8H  . insulin aspart  0-20 Units Subcutaneous Q4H  . insulin glargine  28 Units Subcutaneous BID  . labetalol  400 mg Per Tube TID  . lacosamide  200 mg Per Tube BID  . levETIRAcetam  1,000 mg Per Tube BID  . mouth rinse  15 mL Mouth Rinse QID  . mupirocin cream   Topical Daily  . pantoprazole sodium  40 mg Per Tube QHS  . phenytoin  250 mg Per Tube Q6H  . potassium chloride  50 mEq Per Tube Daily  . scopolamine  1 patch Transdermal Q72H  . thiamine  100 mg Per Tube Daily  . topiramate  200 mg Per Tube BID    Continuous Infusions: . sodium chloride 10 mL/hr at 09/07/17 0600  . heparin 1,350 Units/hr (09/07/17 0600)     LOS: 36 days    Time spent: 40 minutes    WOODS, Geraldo Docker, MD [facility] Pager [phone]   If 7PM-7AM, please contact night-coverage [email or web address] Password [email or web address] 09/07/2017, 11:50 AM

## 2017-09-08 ENCOUNTER — Inpatient Hospital Stay (HOSPITAL_COMMUNITY): Payer: Medicaid Other

## 2017-09-08 LAB — BASIC METABOLIC PANEL
Anion gap: 12 (ref 5–15)
BUN: 52 mg/dL — AB (ref 6–20)
CALCIUM: 9.4 mg/dL (ref 8.9–10.3)
CO2: 20 mmol/L — AB (ref 22–32)
CREATININE: 1.17 mg/dL — AB (ref 0.44–1.00)
Chloride: 104 mmol/L (ref 101–111)
GFR calc non Af Amer: 50 mL/min — ABNORMAL LOW (ref >60)
GFR, EST AFRICAN AMERICAN: 57 mL/min — AB (ref >60)
GLUCOSE: 134 mg/dL — AB (ref 65–99)
Potassium: 5.2 mmol/L — ABNORMAL HIGH (ref 3.5–5.1)
Sodium: 136 mmol/L (ref 135–145)

## 2017-09-08 LAB — CBC
HEMATOCRIT: 33.8 % — AB (ref 36.0–46.0)
Hemoglobin: 11 g/dL — ABNORMAL LOW (ref 12.0–15.0)
MCH: 30.8 pg (ref 26.0–34.0)
MCHC: 32.5 g/dL (ref 30.0–36.0)
MCV: 94.7 fL (ref 78.0–100.0)
Platelets: 408 10*3/uL — ABNORMAL HIGH (ref 150–400)
RBC: 3.57 MIL/uL — ABNORMAL LOW (ref 3.87–5.11)
RDW: 15.9 % — AB (ref 11.5–15.5)
WBC: 11.8 10*3/uL — ABNORMAL HIGH (ref 4.0–10.5)

## 2017-09-08 LAB — HEPARIN LEVEL (UNFRACTIONATED): HEPARIN UNFRACTIONATED: 0.48 [IU]/mL (ref 0.30–0.70)

## 2017-09-08 LAB — GLUCOSE, CAPILLARY
GLUCOSE-CAPILLARY: 71 mg/dL (ref 65–99)
GLUCOSE-CAPILLARY: 180 mg/dL — AB (ref 65–99)
Glucose-Capillary: 133 mg/dL — ABNORMAL HIGH (ref 65–99)
Glucose-Capillary: 153 mg/dL — ABNORMAL HIGH (ref 65–99)
Glucose-Capillary: 212 mg/dL — ABNORMAL HIGH (ref 65–99)
Glucose-Capillary: 92 mg/dL (ref 65–99)

## 2017-09-08 LAB — MAGNESIUM: Magnesium: 2 mg/dL (ref 1.7–2.4)

## 2017-09-08 NOTE — Progress Notes (Signed)
[facility] TEAM 1 - Stepdown/ICU TEAM  Wynn Banker  JKK:[id number] DOB: 1956-04-15 DOA: 08/02/2017 PCP: Nolene Ebbs, MD    Brief Narrative:  62yo F w/ a Hx of polysubstance abuse (EtOH, Cocaine), DM, and HTN who had been previously admitted to this hospital for a left thalamic hemorrhage (Nov 2018). She was found down the day of this admit in a puddle of stool and urine, and brought to the ED where she had overt clinical seizure activity.She was treated with Keppra fosphenytoin and Ativan resulting in arrest of the seizures.    Significant Events: 1/24 admit - intubated 1/25 TTE - EF 55-60% - grade 2 DD 2/6 tracheostomy  2/8 PEG tube in IR  2/11 TRH assumed care  2/21 R UE venous duplex + for DVT subclavian, axillary, proximal brachial veins  3/1 size 6 cuffed trach removed - 6 cuffless placed   Subjective: No signif change in clinical status.     Assessment & Plan:  Seizure - refractory status epilepticus  Due to non-compliance w/ seizure meds, hyperosmolar state, and cocaine use - potentially seizing for days upon arrival (had not been seen for 2 days prior to being found) - Neuro suggests no attempts to wean AEDs for at least 2 months - signif injury to brain thought to have occurred - no clinical evidence of any improvement at this time - remains fully non-communicative - no recurrence of seizure since 2/24  Pseudomonas pneumonia + tracheobronchitis  Completed an initial course of treatment - had been afebrile, then developed increased secretions and low grade fever again - f/u tracheal aspirate confirmed pan-sensitive Pseudomonas - resumed abx 2/17 > 2/24 - no evidence of active infection at this time   Acute hypoxic respiratory failure No longer vent dependent - s/p trach - now has #6 cuffless trach w/ PCCM noting no plan for further downsizing or decannulation given signif neurologic incapacity   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 in setting of HTN -  refused SNF placement at that time  Polysubstance abuse - EtOH + Cocaine   Chronic diastolic CHF low grade anasarca persists but is not particularly problematic   Filed Weights   09/06/17 0316 09/07/17 0500 09/08/17 0500  Weight: 85.4 kg (188 lb 4.4 oz) 81.3 kg (179 lb 3.7 oz) 82.6 kg (182 lb 1.6 oz)    DM Reasonably controlled at this time - no further hypoglycemia   HTN  BP controlled   Hypernatremia  Corrected   Hypokalemia > Hyperkalemia  Corrected - now hyperkalemic - stop replacement and follow   Normocytic Anemia No evidence of acute blood loss - Hgb stable   Acute RIGHT upper extremity DVT R UE DVT noted on venous duplex (associated w/ PICC) > Heparin per pharmacy - if not bleeding over weekend will change to DOAC on Monday   Minimal bright red blood via trach  Appears to have ceased for now   DVT prophylaxis: IV heparin  Code Status: FULL CODE Family Communication: no family present at time of exam  Disposition Plan: awaiting SNF placement   Consultants:  Neurology PCCM  Antimicrobials:  Vancomycin 1/30 > 1/31 Zosyn 1/30 > 2/5 Cipro 2/17 > 2/24  Objective: Blood pressure 120/71, pulse 80, temperature 98.4 F (36.9 C), temperature source Oral, resp. rate (!) 30, height _0  (1.651 m), weight 82.6 kg (182 lb 1.6 oz), SpO2 96 %.  Intake/Output Summary (Last 24 hours) at 09/08/2017 1202 Last data filed at 09/08/2017 1100 Gross per 24  hour  Intake 1465.33 ml  Output 1455 ml  Net 10.33 ml   Filed Weights   09/06/17 0316 09/07/17 0500 09/08/17 0500  Weight: 85.4 kg (188 lb 4.4 oz) 81.3 kg (179 lb 3.7 oz) 82.6 kg (182 lb 1.6 oz)    Examination: General: No acute respiratory distress evident  Lungs: CTA th/o - no wheezing  Cardiovascular: RRR - no M  Abdomen: NT/ND, soft, BS+ Extremities: 1+ anasarca   CBC: Recent Labs  Lab 09/02/17 0315 09/03/17 0201 09/04/17 0830 09/06/17 0302 09/08/17 0514  WBC 11.3* 11.2* 12.0* 9.4 11.8*  HGB 10.3* 9.2*  9.6* 10.5* 11.0*  HCT 32.2* 28.6* 30.0* 32.2* 33.8*  MCV 95.0 95.0 95.2 93.9 94.7  PLT 372 282 368 414* 258*   Basic Metabolic Panel: Recent Labs  Lab 09/02/17 0315 09/03/17 0201 09/04/17 0830 09/06/17 0302 09/08/17 0514  NA 137 136 138 135 136  K 3.8 4.1 4.2 4.1 5.2*  CL 108 108 109 104 104  CO2 18* 17* 19* 20* 20*  GLUCOSE 200* 206* 145* 156* 134*  BUN 53* 54* 58* 48* 52*  CREATININE 1.15* 1.10* 1.11* 1.06* 1.17*  CALCIUM 8.7* 8.7* 9.0 8.9 9.4  MG 2.0 1.9  --   --  2.0   GFR: Estimated Creatinine Clearance: 54.2 mL/min (A) (by C-G formula based on SCr of 1.17 mg/dL (H)).  Liver Function Tests: No results for input(s): AST, ALT, ALKPHOS, BILITOT, PROT, ALBUMIN in the last 168 hours.  HbA1C: Hgb A1c MFr Bld  Date/Time Value Ref Range Status  05/26/2017 10:35 PM 14.6 (H) 4.8 - 5.6 % Final    Comment:    (NOTE) Pre diabetes:          5.7%-6.4% Diabetes:              >6.4% Glycemic control for   <7.0% adults with diabetes   05/11/2017 08:08 AM 13.2 (H) 4.8 - 5.6 % Final    Comment:    (NOTE) Pre diabetes:          5.7%-6.4% Diabetes:              >6.4% Glycemic control for   <7.0% adults with diabetes     CBG: Recent Labs  Lab 09/07/17 2005 09/08/17 0036 09/08/17 0429 09/08/17 0736 09/08/17 1139  GLUCAP 95 153* 133* 92 212*    Scheduled Meds: . amLODipine  10 mg Per Tube Daily  . aspirin  81 mg Per Tube Daily  . chlorhexidine gluconate (MEDLINE KIT)  15 mL Mouth Rinse BID  . cloNIDine  0.2 mg Per Tube TID  . enalapril  30 mg Per Tube Daily  . feeding supplement (VITAL HIGH PROTEIN)  1,000 mL Per Tube Q24H  . free water  100 mL Per Tube Q8H  . furosemide  40 mg Per Tube BID  . hydrALAZINE  100 mg Per Tube Q8H  . insulin aspart  0-20 Units Subcutaneous Q4H  . insulin glargine  28 Units Subcutaneous BID  . labetalol  400 mg Per Tube TID  . lacosamide  200 mg Per Tube BID  . levETIRAcetam  1,000 mg Per Tube BID  . mouth rinse  15 mL Mouth Rinse QID   . mupirocin cream   Topical Daily  . pantoprazole sodium  40 mg Per Tube QHS  . phenytoin  250 mg Per Tube Q6H  . potassium chloride  50 mEq Per Tube Daily  . scopolamine  1 patch Transdermal Q72H  . thiamine  100 mg Per Tube Daily  . topiramate  200 mg Per Tube BID     LOS: 23 days   Cherene Altes, MD [facility] Office  [phone] Pager - Text Page per Amion as per below:  On-Call/Text Page:      [email or web address]      password [email or web address]  If 7PM-7AM, please contact night-coverage [email or web address] Password [email or web address] 09/08/2017, 12:02 PM

## 2017-09-08 NOTE — Progress Notes (Signed)
ANTICOAGULATION CONSULT NOTE Pharmacy Consult for heparin Indication: DVT  No Known Allergies  Patient Measurements: Height: 5\' 5"  (165.1 cm) Weight: 182 lb 1.6 oz (82.6 kg) IBW/kg (Calculated) : 57 Heparin Dosing Weight: 75.9kg  Vital Signs: Temp: 98.8 F (37.1 C) (03/02 1200) Temp Source: Oral (03/02 1200) BP: 129/64 (03/02 1200) Pulse Rate: 80 (03/02 1148)  Labs: Recent Labs    09/06/17 0302 09/07/17 0358 09/08/17 0514  HGB 10.5*  --  11.0*  HCT 32.2*  --  33.8*  PLT 414*  --  408*  HEPARINUNFRC 0.42 0.40 0.48  CREATININE 1.06*  --  1.17*   Assessment: 48 YOF with 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 She was discovered to have RUE DVT. Pharmacy consulted to dose Heparin for anticoagulation.   Heparin level today remains therapeutic (HL 0.48, goal of 0.3-0.7). CBC stable. Bleeding around the trach site noted yesterday - per RN report improved today.   Goal of Therapy:  Heparin level 0.3-0.5 units/ml Monitor platelets by anticoagulation protocol: Yes   Plan:  -Continue heparin 1350 units/hr  -Daily heparin level/CBC -Monitor for s/sx of bleeding  Thank you for allowing pharmacy to be a part of this patient's care.  Alycia Rossetti, PharmD, BCPS Clinical Pharmacist Pager: [phone] Clinical phone for 09/08/2017 from 7a-3:30p: [phone] If after 3:30p, please call main pharmacy at: [phone] 09/08/2017 12:43 PM

## 2017-09-09 LAB — CBC
HCT: 34 % — ABNORMAL LOW (ref 36.0–46.0)
HEMOGLOBIN: 11 g/dL — AB (ref 12.0–15.0)
MCH: 30.6 pg (ref 26.0–34.0)
MCHC: 32.4 g/dL (ref 30.0–36.0)
MCV: 94.7 fL (ref 78.0–100.0)
Platelets: 411 10*3/uL — ABNORMAL HIGH (ref 150–400)
RBC: 3.59 MIL/uL — AB (ref 3.87–5.11)
RDW: 15.8 % — ABNORMAL HIGH (ref 11.5–15.5)
WBC: 12.4 10*3/uL — AB (ref 4.0–10.5)

## 2017-09-09 LAB — BASIC METABOLIC PANEL
ANION GAP: 14 (ref 5–15)
BUN: 52 mg/dL — ABNORMAL HIGH (ref 6–20)
CHLORIDE: 102 mmol/L (ref 101–111)
CO2: 17 mmol/L — ABNORMAL LOW (ref 22–32)
Calcium: 9.1 mg/dL (ref 8.9–10.3)
Creatinine, Ser: 1.27 mg/dL — ABNORMAL HIGH (ref 0.44–1.00)
GFR calc non Af Amer: 45 mL/min — ABNORMAL LOW (ref >60)
GFR, EST AFRICAN AMERICAN: 52 mL/min — AB (ref >60)
Glucose, Bld: 179 mg/dL — ABNORMAL HIGH (ref 65–99)
POTASSIUM: 4.2 mmol/L (ref 3.5–5.1)
SODIUM: 133 mmol/L — AB (ref 135–145)

## 2017-09-09 LAB — HEPARIN LEVEL (UNFRACTIONATED): Heparin Unfractionated: 0.5 IU/mL (ref 0.30–0.70)

## 2017-09-09 LAB — GLUCOSE, CAPILLARY
GLUCOSE-CAPILLARY: 181 mg/dL — AB (ref 65–99)
GLUCOSE-CAPILLARY: 152 mg/dL — AB (ref 65–99)
GLUCOSE-CAPILLARY: 164 mg/dL — AB (ref 65–99)
Glucose-Capillary: 178 mg/dL — ABNORMAL HIGH (ref 65–99)
Glucose-Capillary: 180 mg/dL — ABNORMAL HIGH (ref 65–99)
Glucose-Capillary: 92 mg/dL (ref 65–99)

## 2017-09-09 NOTE — Progress Notes (Signed)
[facility] TEAM 1 - Stepdown/ICU TEAM  Wynn Banker  JSH:[id number] DOB: 24-Sep-1955 DOA: 08/02/2017 PCP: Nolene Ebbs, MD    Brief Narrative:  62yo F w/ a Hx of polysubstance abuse (EtOH, Cocaine), DM, and HTN who had been previously admitted to this hospital for a left thalamic hemorrhage (Nov 2018). She was found down the day of this admit in a puddle of stool and urine, and brought to the ED where she had overt clinical seizure activity.She was treated with Keppra fosphenytoin and Ativan resulting in arrest of the seizures.    Significant Events: 1/24 admit - intubated 1/25 TTE - EF 55-60% - grade 2 DD 2/6 tracheostomy  2/8 PEG tube in IR  2/11 TRH assumed care  2/21 R UE venous duplex + for DVT subclavian, axillary, proximal brachial veins  3/1 size 6 cuffed trach removed - 6 cuffless placed   Subjective: No signif change in clinical status.     Assessment & Plan:  Seizure - refractory status epilepticus  Due to non-compliance w/ seizure meds, hyperosmolar state, and cocaine use - potentially seizing for days upon arrival (had not been seen for 2 days prior to being found) - Neuro suggests no attempts to wean AEDs for at least 2 months - signif injury to brain thought to have occurred - no clinical evidence of any improvement at this time - remains fully non-communicative - no recurrence of seizure since 2/24  Pseudomonas pneumonia + tracheobronchitis  Completed an initial course of treatment - had been afebrile, then developed increased secretions and low grade fever again - f/u tracheal aspirate confirmed pan-sensitive Pseudomonas - resumed abx 2/17 > 2/24 - no evidence of active infection at this time   Acute hypoxic respiratory failure s/p trach 08/15/17 - no longer vent dependent - now has #6 cuffless trach w/ PCCM noting no plan for further downsizing or decannulation given signif neurologic incapacity   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 in setting of  HTN - refused SNF placement at that time  Polysubstance abuse - EtOH + Cocaine   Chronic diastolic CHF low grade anasarca persists but is not particularly problematic   Filed Weights   09/07/17 0500 09/08/17 0500 09/09/17 0500  Weight: 81.3 kg (179 lb 3.7 oz) 82.6 kg (182 lb 1.6 oz) 81.5 kg (179 lb 10.8 oz)    DM Reasonably controlled at this time - no further hypoglycemia   HTN  BP controlled   Hypernatremia  Corrected - stop enteral free water as Na now slightly below normal   Hypokalemia > Hyperkalemia  Corrected   Normocytic Anemia No evidence of acute blood loss - Hgb stable   Acute RIGHT upper extremity DVT R UE DVT noted on venous duplex (associated w/ PICC) > Heparin per pharmacy - if not bleeding over weekend will change to DOAC on Monday   Minimal bright red blood via trach  Appears to have ceased for now   DVT prophylaxis: IV heparin  Code Status: FULL CODE Family Communication: no family present at time of exam  Disposition Plan: awaiting SNF placement   Consultants:  Neurology PCCM  Antimicrobials:  Vancomycin 1/30 > 1/31 Zosyn 1/30 > 2/5 Cipro 2/17 > 2/24  Objective: Blood pressure 136/66, pulse 85, temperature 98.4 F (36.9 C), temperature source Oral, resp. rate (!) 30, height '5\' 5"'$  (1.651 m), weight 81.5 kg (179 lb 10.8 oz), SpO2 96 %.  Intake/Output Summary (Last 24 hours) at 09/09/2017 1156 Last data filed at 09/09/2017  1100 Gross per 24 hour  Intake 1194.16 ml  Output 1070 ml  Net 124.16 ml   Filed Weights   09/07/17 0500 09/08/17 0500 09/09/17 0500  Weight: 81.3 kg (179 lb 3.7 oz) 82.6 kg (182 lb 1.6 oz) 81.5 kg (179 lb 10.8 oz)    Examination: General: No acute respiratory distress  Lungs: CTA th/o Cardiovascular: RRR Abdomen: NT/ND, soft, BS+ Extremities: 1+ anasarca   CBC: Recent Labs  Lab 09/03/17 0201 09/04/17 0830 09/06/17 0302 09/08/17 0514 09/09/17 0515  WBC 11.2* 12.0* 9.4 11.8* 12.4*  HGB 9.2* 9.6* 10.5* 11.0*  11.0*  HCT 28.6* 30.0* 32.2* 33.8* 34.0*  MCV 95.0 95.2 93.9 94.7 94.7  PLT 282 368 414* 408* 027*   Basic Metabolic Panel: Recent Labs  Lab 09/03/17 0201 09/04/17 0830 09/06/17 0302 09/08/17 0514 09/09/17 0515  NA 136 138 135 136 133*  K 4.1 4.2 4.1 5.2* 4.2  CL 108 109 104 104 102  CO2 17* 19* 20* 20* 17*  GLUCOSE 206* 145* 156* 134* 179*  BUN 54* 58* 48* 52* 52*  CREATININE 1.10* 1.11* 1.06* 1.17* 1.27*  CALCIUM 8.7* 9.0 8.9 9.4 9.1  MG 1.9  --   --  2.0  --    GFR: Estimated Creatinine Clearance: 49.7 mL/min (A) (by C-G formula based on SCr of 1.27 mg/dL (H)).  Liver Function Tests: No results for input(s): AST, ALT, ALKPHOS, BILITOT, PROT, ALBUMIN in the last 168 hours.  HbA1C: Hgb A1c MFr Bld  Date/Time Value Ref Range Status  05/26/2017 10:35 PM 14.6 (H) 4.8 - 5.6 % Final    Comment:    (NOTE) Pre diabetes:          5.7%-6.4% Diabetes:              >6.4% Glycemic control for   <7.0% adults with diabetes   05/11/2017 08:08 AM 13.2 (H) 4.8 - 5.6 % Final    Comment:    (NOTE) Pre diabetes:          5.7%-6.4% Diabetes:              >6.4% Glycemic control for   <7.0% adults with diabetes     CBG: Recent Labs  Lab 09/08/17 1603 09/08/17 1942 09/09/17 0031 09/09/17 0429 09/09/17 0723  GLUCAP 71 180* 180* 181* 152*    Scheduled Meds: . amLODipine  10 mg Per Tube Daily  . aspirin  81 mg Per Tube Daily  . chlorhexidine gluconate (MEDLINE KIT)  15 mL Mouth Rinse BID  . cloNIDine  0.2 mg Per Tube TID  . enalapril  30 mg Per Tube Daily  . feeding supplement (VITAL HIGH PROTEIN)  1,000 mL Per Tube Q24H  . free water  100 mL Per Tube Q8H  . furosemide  40 mg Per Tube BID  . hydrALAZINE  100 mg Per Tube Q8H  . insulin aspart  0-20 Units Subcutaneous Q4H  . insulin glargine  28 Units Subcutaneous BID  . labetalol  400 mg Per Tube TID  . lacosamide  200 mg Per Tube BID  . levETIRAcetam  1,000 mg Per Tube BID  . mouth rinse  15 mL Mouth Rinse QID  .  mupirocin cream   Topical Daily  . pantoprazole sodium  40 mg Per Tube QHS  . phenytoin  250 mg Per Tube Q6H  . scopolamine  1 patch Transdermal Q72H  . thiamine  100 mg Per Tube Daily  . topiramate  200 mg Per Tube  BID     LOS: 56 days   Cherene Altes, MD [facility] Office  [phone] Pager - Text Page per Amion as per below:  On-Call/Text Page:      [email or web address]      password [email or web address]  If 7PM-7AM, please contact night-coverage [email or web address] Password [email or web address] 09/09/2017, 11:56 AM

## 2017-09-09 NOTE — Progress Notes (Signed)
ANTICOAGULATION CONSULT NOTE Pharmacy Consult for heparin Indication: DVT  No Known Allergies  Patient Measurements: Height: 5\' 5"  (165.1 cm) Weight: 179 lb 10.8 oz (81.5 kg) IBW/kg (Calculated) : 57 Heparin Dosing Weight: 75.9kg  Vital Signs: Temp: 98.4 F (36.9 C) (03/03 0726) Temp Source: Oral (03/03 0726) BP: 130/66 (03/03 0800) Pulse Rate: 83 (03/03 0728)  Labs: Recent Labs    09/07/17 0358 09/08/17 0514 09/09/17 0515  HGB  --  11.0* 11.0*  HCT  --  33.8* 34.0*  PLT  --  408* 411*  HEPARINUNFRC 0.40 0.48 0.50  CREATININE  --  1.17* 1.27*   Assessment: 30 YOF with 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 She was discovered to have RUE DVT. Pharmacy consulted to dose Heparin for anticoagulation.   Heparin level today remains therapeutic however trending up slowly (HL 0.5, goal of 0.3-0.5). CBC stable. Bleeding around the trach site noted 3/1  - per RN report improved/resolved. Will reduce the heparin drip slightly to keep within range.  Goal of Therapy:  Heparin level 0.3-0.5 units/ml Monitor platelets by anticoagulation protocol: Yes   Plan:  - Decrease Heparin slightly to 1300 units/hr (13 ml/hr)  - Daily heparin level/CBC - Monitor for s/sx of bleeding  Thank you for allowing pharmacy to be a part of this patient's care.  Alycia Rossetti, PharmD, BCPS Clinical Pharmacist Pager: [phone] Clinical phone for 09/09/2017 from 7a-3:30p: [phone] If after 3:30p, please call main pharmacy at: [phone] 09/09/2017 9:35 AM

## 2017-09-10 ENCOUNTER — Other Ambulatory Visit: Payer: Self-pay

## 2017-09-10 LAB — BASIC METABOLIC PANEL
ANION GAP: 13 (ref 5–15)
BUN: 56 mg/dL — AB (ref 6–20)
CALCIUM: 8.9 mg/dL (ref 8.9–10.3)
CO2: 20 mmol/L — ABNORMAL LOW (ref 22–32)
CREATININE: 1.21 mg/dL — AB (ref 0.44–1.00)
Chloride: 103 mmol/L (ref 101–111)
GFR calc Af Amer: 55 mL/min — ABNORMAL LOW (ref >60)
GFR, EST NON AFRICAN AMERICAN: 48 mL/min — AB (ref >60)
GLUCOSE: 67 mg/dL (ref 65–99)
Potassium: 3.9 mmol/L (ref 3.5–5.1)
Sodium: 136 mmol/L (ref 135–145)

## 2017-09-10 LAB — GLUCOSE, CAPILLARY
GLUCOSE-CAPILLARY: 126 mg/dL — AB (ref 65–99)
GLUCOSE-CAPILLARY: 62 mg/dL — AB (ref 65–99)
Glucose-Capillary: 112 mg/dL — ABNORMAL HIGH (ref 65–99)
Glucose-Capillary: 126 mg/dL — ABNORMAL HIGH (ref 65–99)
Glucose-Capillary: 147 mg/dL — ABNORMAL HIGH (ref 65–99)
Glucose-Capillary: 100 mg/dL — ABNORMAL HIGH (ref 65–99)
Glucose-Capillary: 206 mg/dL — ABNORMAL HIGH (ref 65–99)

## 2017-09-10 LAB — CBC
HCT: 31.5 % — ABNORMAL LOW (ref 36.0–46.0)
HEMOGLOBIN: 10.2 g/dL — AB (ref 12.0–15.0)
MCH: 30.6 pg (ref 26.0–34.0)
MCHC: 32.4 g/dL (ref 30.0–36.0)
MCV: 94.6 fL (ref 78.0–100.0)
PLATELETS: 333 10*3/uL (ref 150–400)
RBC: 3.33 MIL/uL — ABNORMAL LOW (ref 3.87–5.11)
RDW: 15.9 % — AB (ref 11.5–15.5)
WBC: 10.5 10*3/uL (ref 4.0–10.5)

## 2017-09-10 LAB — HEPARIN LEVEL (UNFRACTIONATED)
HEPARIN UNFRACTIONATED: 0.61 [IU]/mL (ref 0.30–0.70)
Heparin Unfractionated: 0.36 IU/mL (ref 0.30–0.70)

## 2017-09-10 MED ORDER — APIXABAN 5 MG PO TABS
10.0000 mg | ORAL_TABLET | Freq: Two times a day (BID) | ORAL | Status: DC
  Administered 2017-09-10: 10 mg via ORAL
  Filled 2017-09-10: qty 2

## 2017-09-10 MED ORDER — APIXABAN 5 MG PO TABS: 5.0000 mg | ORAL_TABLET | Freq: Two times a day (BID) | ORAL | Status: DC

## 2017-09-10 MED ORDER — DEXTROSE 50 % IV SOLN
INTRAVENOUS | Status: AC
  Administered 2017-09-10: 25 mL via INTRAVENOUS
  Filled 2017-09-10: qty 50

## 2017-09-10 MED ORDER — DEXTROSE 50 % IV SOLN
25.0000 mL | Freq: Once | INTRAVENOUS | Status: AC
  Administered 2017-09-10: 25 mL via INTRAVENOUS

## 2017-09-10 MED ORDER — APIXABAN 5 MG PO TABS: 10.0000 mg | ORAL_TABLET | Freq: Two times a day (BID) | ORAL | Status: DC

## 2017-09-10 NOTE — Progress Notes (Signed)
ANTICOAGULATION CONSULT NOTE  Pharmacy Consult:  Transition from IV Heparin to oral Apixaban Indication: DVT RUE  No Known Allergies  Patient Measurements: Height: 5\' 5"  (165.1 cm) Weight: 181 lb 14.1 oz (82.5 kg) IBW/kg (Calculated) : 57 Heparin Dosing Weight: 76 kg  Vital Signs: Temp: 98.7 F (37.1 C) (03/04 0715) BP: 133/64 (03/04 1600) Pulse Rate: 89 (03/04 1519)  Labs: Recent Labs    09/08/17 0514 09/09/17 0515 09/10/17 0354  HGB 11.0* 11.0* 10.2*  HCT 33.8* 34.0* 31.5*  PLT 408* 411* 333  HEPARINUNFRC 0.48 0.50 0.61  CREATININE 1.17* 1.27* 1.21*    Assessment: 61 YOF with 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 She was discovered to have RUE DVT.   Pharmacy consulted tonight 3/4 to transition from IV heparin to Apixaban for RUE DVT. Tonight's heparin level  just resulted = 0.36,  therapeutic. No bleeding noted today and per RN's report.  Goal of Therapy:  Monitor platelets by anticoagulation protocol: Yes    Plan:  Stop IV hep now, and start Apixaban 10mg  po BID x 7 days then 5mg  po BID Continue to watch closely for bleed    Thank you for allowing pharmacy to be part of this patients care team. Nicole Cella, RPh Clinical Pharmacist Pager: [phone] 09/10/2017, 4:57 PM

## 2017-09-10 NOTE — Progress Notes (Signed)
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 She was found down the day of this admit in a puddle of stool and urine, and brought to the ED where she had overt clinical seizure activity.She was treated with Keppra fosphenytoin and Ativan resulting in arrest of the seizures.    Significant Events: 1/24 admit - intubated 1/25 TTE - EF 55-60% - grade 2 DD 2/6 tracheostomy  2/8 PEG tube in IR  2/11 TRH assumed care  2/21 R UE venous duplex + for DVT subclavian, axillary, proximal brachial veins  3/1 size 6 cuffed trach removed - 6 cuffless placed   Subjective: No signif change in clinical status.  No evidence of acute distress.    Assessment & Plan:  Seizure - refractory status epilepticus  Due to non-compliance w/ seizure meds, hyperosmolar state, and cocaine use - potentially seizing for days upon arrival (had not been seen for 2 days prior to being found) - Neuro suggests no attempts to wean AEDs for at least 2 months - signif injury to brain thought to have occurred - no clinical evidence of any improvement at this time - remains fully non-communicative - no recurrence of seizure since 2/24  Pseudomonas pneumonia + tracheobronchitis  Completed an initial course of treatment - had been afebrile, then developed increased secretions and low grade fever again - f/u tracheal aspirate confirmed pan-sensitive Pseudomonas - resumed abx 2/17 > 2/24 - no evidence of active infection at this time   Acute hypoxic respiratory failure s/p trach 08/15/17 - no longer vent dependent - now has #6 cuffless trach w/ PCCM noting no plan for further downsizing or decannulation given signif neurologic incapacity   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 due  to cocaine abuse in setting of HTN - refused SNF placement at that time  Polysubstance abuse - EtOH + Cocaine   Chronic diastolic CHF low grade anasarca persists but is not particularly problematic   Filed Weights   09/08/17 0500 09/09/17 0500 09/10/17 0500  Weight: 82.6 kg (182 lb 1.6 oz) 81.5 kg (179 lb 10.8 oz) 82.5 kg (181 lb 14.1 oz)    DM Reasonably controlled at this time - no further hypoglycemia   HTN  BP controlled   Hypernatremia  Corrected   Hypokalemia > Hyperkalemia  Corrected   Normocytic Anemia No evidence of acute blood loss - Hgb stable   Acute RIGHT upper extremity DVT R UE DVT noted on venous duplex (associated w/ PICC) > Heparin per pharmacy to transition to [place] today   Minimal bright red blood via trach  Appears to have ceased   DVT prophylaxis: IV heparin > Eliquis  Code Status: FULL CODE Family Communication: no family present at time of exam  Disposition Plan: awaiting SNF placement   Consultants:  Neurology PCCM  Antimicrobials:  Vancomycin 1/30 > 1/31 Zosyn 1/30 > 2/5 Cipro 2/17 > 2/24  Objective: Blood pressure 121/61, pulse 89, temperature 98.7 F (37.1 C), resp. rate (!) 23, height '5\' 5"'$  (1.651 m), weight 82.5 kg (181 lb 14.1 oz), SpO2 98 %.  Intake/Output Summary (Last 24 hours) at 09/10/2017 1553 Last data filed at 09/10/2017 1500 Gross per 24 hour  Intake 1461.33 ml  Output 1050 ml  Net 411.33  ml   Filed Weights   09/08/17 0500 09/09/17 0500 09/10/17 0500  Weight: 82.6 kg (182 lb 1.6 oz) 81.5 kg (179 lb 10.8 oz) 82.5 kg (181 lb 14.1 oz)    Examination: General: No acute respiratory distress  Lungs: CTA th/o Cardiovascular: RRR Abdomen: NT/ND, soft, BS+ Extremities: 1+ anasarca w/o change   CBC: Recent Labs  Lab 09/04/17 0830 09/06/17 0302 09/08/17 0514 09/09/17 0515 09/10/17 0354  WBC 12.0* 9.4 11.8* 12.4* 10.5  HGB 9.6* 10.5* 11.0* 11.0* 10.2*  HCT 30.0* 32.2* 33.8* 34.0* 31.5*  MCV 95.2 93.9 94.7 94.7  94.6  PLT 368 414* 408* 411* 702   Basic Metabolic Panel: Recent Labs  Lab 09/04/17 0830 09/06/17 0302 09/08/17 0514 09/09/17 0515 09/10/17 0354  NA 138 135 136 133* 136  K 4.2 4.1 5.2* 4.2 3.9  CL 109 104 104 102 103  CO2 19* 20* 20* 17* 20*  GLUCOSE 145* 156* 134* 179* 67  BUN 58* 48* 52* 52* 56*  CREATININE 1.11* 1.06* 1.17* 1.27* 1.21*  CALCIUM 9.0 8.9 9.4 9.1 8.9  MG  --   --  2.0  --   --    GFR: Estimated Creatinine Clearance: 52.5 mL/min (A) (by C-G formula based on SCr of 1.21 mg/dL (H)).  Liver Function Tests: No results for input(s): AST, ALT, ALKPHOS, BILITOT, PROT, ALBUMIN in the last 168 hours.  HbA1C: Hgb A1c MFr Bld  Date/Time Value Ref Range Status  05/26/2017 10:35 PM 14.6 (H) 4.8 - 5.6 % Final    Comment:    (NOTE) Pre diabetes:          5.7%-6.4% Diabetes:              >6.4% Glycemic control for   <7.0% adults with diabetes   05/11/2017 08:08 AM 13.2 (H) 4.8 - 5.6 % Final    Comment:    (NOTE) Pre diabetes:          5.7%-6.4% Diabetes:              >6.4% Glycemic control for   <7.0% adults with diabetes     CBG: Recent Labs  Lab 09/10/17 0353 09/10/17 0449 09/10/17 0714 09/10/17 1146 09/10/17 1501  GLUCAP 62* 112* 126* 147* 100*    Scheduled Meds: . amLODipine  10 mg Per Tube Daily  . aspirin  81 mg Per Tube Daily  . chlorhexidine gluconate (MEDLINE KIT)  15 mL Mouth Rinse BID  . cloNIDine  0.2 mg Per Tube TID  . enalapril  30 mg Per Tube Daily  . feeding supplement (VITAL HIGH PROTEIN)  1,000 mL Per Tube Q24H  . furosemide  40 mg Per Tube BID  . hydrALAZINE  100 mg Per Tube Q8H  . insulin aspart  0-20 Units Subcutaneous Q4H  . insulin glargine  28 Units Subcutaneous BID  . labetalol  400 mg Per Tube TID  . lacosamide  200 mg Per Tube BID  . levETIRAcetam  1,000 mg Per Tube BID  . mouth rinse  15 mL Mouth Rinse QID  . mupirocin cream   Topical Daily  . pantoprazole sodium  40 mg Per Tube QHS  . phenytoin  250 mg Per Tube  Q6H  . scopolamine  1 patch Transdermal Q72H  . thiamine  100 mg Per Tube Daily  . topiramate  200 mg Per Tube BID     LOS: 108 days   Cherene Altes, MD [facility] Office  [phone] Pager - Text  Page per Shea Evans as per below:  On-Call/Text Page:      [email or web address]      password [email or web address]  If 7PM-7AM, please contact night-coverage [email or web address] Password [email or web address] 09/10/2017, 3:53 PM

## 2017-09-10 NOTE — Progress Notes (Signed)
ANTICOAGULATION CONSULT NOTE  Pharmacy Consult:  Heparin Indication: DVT  No Known Allergies  Patient Measurements: Height: 5\' 5"  (165.1 cm) Weight: 181 lb 14.1 oz (82.5 kg) IBW/kg (Calculated) : 57 Heparin Dosing Weight: 76 kg  Vital Signs: Temp: 98.7 F (37.1 C) (03/04 0715) Temp Source: Oral (03/04 0415) BP: 129/66 (03/04 1000) Pulse Rate: 88 (03/04 0733)  Labs: Recent Labs    09/08/17 0514 09/09/17 0515 09/10/17 0354  HGB 11.0* 11.0* 10.2*  HCT 33.8* 34.0* 31.5*  PLT 408* 411* 333  HEPARINUNFRC 0.48 0.50 0.61  CREATININE 1.17* 1.27* 1.21*    Assessment: 81 YOF with 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 She was discovered to have RUE DVT. Pharmacy consulted to dose Heparin for anticoagulation.   Heparin level trended up to supra-therapeutic level.  No bleeding per RN.   Goal of Therapy:  Heparin level 0.3-0.5 units/ml Monitor platelets by anticoagulation protocol: Yes    Plan:  Reduce heparin gtt to 1200 units/hr, no bolus Check 6 hr heparin level Daily heparin level and CBC   Kelcie Currie D. Mina Marble, PharmD, BCPS Pager:  [phone] 09/10/2017, 10:15 AM

## 2017-09-11 LAB — BASIC METABOLIC PANEL
ANION GAP: 14 (ref 5–15)
BUN: 59 mg/dL — ABNORMAL HIGH (ref 6–20)
CALCIUM: 9.3 mg/dL (ref 8.9–10.3)
CO2: 20 mmol/L — ABNORMAL LOW (ref 22–32)
Chloride: 102 mmol/L (ref 101–111)
Creatinine, Ser: 1.3 mg/dL — ABNORMAL HIGH (ref 0.44–1.00)
GFR, EST AFRICAN AMERICAN: 51 mL/min — AB (ref >60)
GFR, EST NON AFRICAN AMERICAN: 44 mL/min — AB (ref >60)
GLUCOSE: 141 mg/dL — AB (ref 65–99)
POTASSIUM: 3.9 mmol/L (ref 3.5–5.1)
SODIUM: 136 mmol/L (ref 135–145)

## 2017-09-11 LAB — PHENYTOIN LEVEL, TOTAL: PHENYTOIN LVL: 34.9 ug/mL — AB (ref 10.0–20.0)

## 2017-09-11 LAB — GLUCOSE, CAPILLARY
GLUCOSE-CAPILLARY: 136 mg/dL — AB (ref 65–99)
GLUCOSE-CAPILLARY: 149 mg/dL — AB (ref 65–99)
GLUCOSE-CAPILLARY: 184 mg/dL — AB (ref 65–99)
GLUCOSE-CAPILLARY: 198 mg/dL — AB (ref 65–99)
Glucose-Capillary: 206 mg/dL — ABNORMAL HIGH (ref 65–99)
Glucose-Capillary: 61 mg/dL — ABNORMAL LOW (ref 65–99)
Glucose-Capillary: 62 mg/dL — ABNORMAL LOW (ref 65–99)
Glucose-Capillary: 116 mg/dL — ABNORMAL HIGH (ref 65–99)
Glucose-Capillary: 165 mg/dL — ABNORMAL HIGH (ref 65–99)

## 2017-09-11 LAB — HEPARIN LEVEL (UNFRACTIONATED): Heparin Unfractionated: 0.24 IU/mL — ABNORMAL LOW (ref 0.30–0.70)

## 2017-09-11 LAB — APTT: aPTT: 36 seconds (ref 24–36)

## 2017-09-11 MED ORDER — DEXTROSE 50 % IV SOLN
25.0000 g | Freq: Once | INTRAVENOUS | Status: AC
  Administered 2017-09-11: 25 g via INTRAVENOUS

## 2017-09-11 MED ORDER — WARFARIN - PHARMACIST DOSING INPATIENT: Freq: Every day | Status: DC

## 2017-09-11 MED ORDER — COUMADIN BOOK
Freq: Once | Status: AC
  Administered 2017-09-11: 11:00:00
  Filled 2017-09-11: qty 1

## 2017-09-11 MED ORDER — WARFARIN SODIUM 5 MG PO TABS
5.0000 mg | ORAL_TABLET | Freq: Once | ORAL | Status: AC
  Administered 2017-09-11: 5 mg via ORAL
  Filled 2017-09-11: qty 1

## 2017-09-11 MED ORDER — PRO-STAT SUGAR FREE PO LIQD
30.0000 mL | Freq: Three times a day (TID) | ORAL | Status: DC
  Administered 2017-09-11 – 2017-09-14 (×10): 30 mL
  Filled 2017-09-11 (×10): qty 30

## 2017-09-11 MED ORDER — DEXTROSE 50 % IV SOLN
INTRAVENOUS | Status: AC
  Administered 2017-09-11: 25 g via INTRAVENOUS
  Filled 2017-09-11: qty 50

## 2017-09-11 MED ORDER — HEPARIN (PORCINE) IN NACL 100-0.45 UNIT/ML-% IJ SOLN
1650.0000 [IU]/h | INTRAMUSCULAR | Status: DC
  Administered 2017-09-11: 1200 [IU]/h via INTRAVENOUS
  Administered 2017-09-13: 1550 [IU]/h via INTRAVENOUS
  Filled 2017-09-11 (×3): qty 250

## 2017-09-11 MED ORDER — JEVITY 1.2 CAL PO LIQD
1000.0000 mL | ORAL | Status: DC
  Administered 2017-09-11 – 2017-09-14 (×4): 1000 mL
  Filled 2017-09-11 (×6): qty 1000

## 2017-09-11 MED ORDER — INSULIN GLARGINE 100 UNIT/ML ~~LOC~~ SOLN
20.0000 [IU] | Freq: Two times a day (BID) | SUBCUTANEOUS | Status: DC
  Administered 2017-09-11 – 2017-09-14 (×6): 20 [IU] via SUBCUTANEOUS
  Filled 2017-09-11 (×7): qty 0.2

## 2017-09-11 NOTE — Progress Notes (Signed)
  Speech Language Pathology Treatment: [name] Speaking valve  Patient Details Name: [name] MRN: [id number] DOB: 1955/08/12 Today's Date: 09/11/2017 Time: 2951-8841 SLP Time Calculation (min) (ACUTE ONLY): 11 min  Assessment / Plan / Recommendation Clinical Impression  Pt presents with continued limited tolerance of PMSV for max 30 seconds as indicated by increase in respiratory rate, inconsistent back pressure with removal of PMSV, and increased WOB during PMSV trials. Pt's changes in respirations were likely related to both presence of PMSV and amount of secretions. Audible secretions evident and RN reports needing tracheal suctioning, given inability to expectorate at trach despite verbal cues. SLP will continue to follow to determine increased tolerance of PMSV, with potential progress with smaller trach if medically appropriate.      HPI HPI: This is a 62 year old diabetic with a history of alcohol and cocaine abuse who is previously been admitted to this hospital for a left thalamic hemorrhage. She was found down today in a puddle of stool and urine. She brought to our department emergency medicine where she had overt clinical seizure activity.She was treated with Keppra fosphenytoin and Ativan in the clinical seizures have subsided. EEG does not show ongoing seizure activity but shows decreased activity throughout the left hemisphere suggesting a postictal state. She is unable to give any history and remains obtunded at this time. Imaging studies did not show a new structural lesion. She was coughing up purulent material in the department of emergency medicine. 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 Pt intubated 1/24, trach placed 2/6 and has been on trach collar for last 24 hours. Pt currently has 106mm Shiley cuffed trach. Pt has PEG.       SLP Plan  Continue with current plan of care       Recommendations         Patient may use Passy-Muir Speech Valve: with SLP only PMSV Supervision: Full MD: Please consider changing trach tube to : Smaller size;Cuffless         Oral Care Recommendations: Oral care QID Follow up Recommendations: Other (comment)(tbd) SLP Visit Diagnosis: Aphonia (R49.1) Plan: Continue with current plan of care       GO              [place] Cheronda Erck SLP Student Clinician   [place] Rashada Klontz 09/11/2017, 1:34 PM

## 2017-09-11 NOTE — Progress Notes (Signed)
CSW confirmed bed offer with [facility] SN[facility]- informed pt dtr of this and MD plan to DC in next day or so- dtr expressed understanding  CSW will continue to follow  [name], [place] Social Worker [phone]

## 2017-09-11 NOTE — Progress Notes (Signed)
[facility] TEAM 1 - Stepdown/ICU TEAM  Wynn Banker  YBO:[id number] DOB: Mar 19, 1956 DOA: 08/02/2017 PCP: Nolene Ebbs, MD    Brief Narrative:  62yo F w/ a Hx of polysubstance abuse (EtOH, Cocaine), DM, and HTN who had been previously admitted to this hospital for a left thalamic hemorrhage (Nov 2018). She was found down the day of this admit in a puddle of stool and urine, and brought to the ED where she had overt clinical seizure activity.She was treated with Keppra fosphenytoin and Ativan resulting in arrest of the seizures.    Significant Events: 1/24 admit - intubated 1/25 TTE - EF 55-60% - grade 2 DD 2/6 tracheostomy  2/8 PEG tube in IR  2/11 TRH assumed care  2/21 R UE venous duplex + for DVT subclavian, axillary, proximal brachial veins  3/1 size 6 cuffed trach removed - 6 cuffless placed   Subjective: No signif change in clinical status.  No evidence of acute distress or uncontrolled pain.     Assessment & Plan:  Seizure - refractory status epilepticus  Due to non-compliance w/ seizure meds, hyperosmolar state, and cocaine use - potentially seizing for days upon arrival (had not been seen for 2 days prior to being found) - Neuro suggests no attempts to wean AEDs for at least 2 months - signif injury to brain thought to have occurred - no clinical evidence of any improvement at this time - remains fully non-communicative - no recurrence of seizure since 2/24  Pseudomonas pneumonia + tracheobronchitis  Completed an initial course of treatment - had been afebrile, then developed increased secretions and low grade fever again - f/u tracheal aspirate confirmed pan-sensitive Pseudomonas - resumed abx 2/17 > 2/24 - no evidence of active infection at this time - WBC normal and afebrile   Acute hypoxic respiratory failure s/p trach 08/15/17 - no longer vent dependent - now has #6 cuffless trach w/ PCCM noting no plan for further downsizing or decannulation given signif neurologic  incapacity - stable   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 in setting of HTN - refused SNF placement at that time  Polysubstance abuse - EtOH + Cocaine   Chronic diastolic CHF low grade anasarca persists but is not particularly problematic and is presently stable   Filed Weights   09/08/17 0500 09/09/17 0500 09/10/17 0500  Weight: 82.6 kg (182 lb 1.6 oz) 81.5 kg (179 lb 10.8 oz) 82.5 kg (181 lb 14.1 oz)    DM Has experienced some recurrence of hypoglycemia - adjust tx again and follow   HTN  BP controlled   Hypernatremia  Corrected   Hypokalemia > Hyperkalemia  Corrected   Normocytic Anemia No evidence of acute blood loss - Hgb stable   Acute RIGHT upper extremity DVT R UE DVT noted on venous duplex (associated w/ PICC) > Heparin per pharmacy to transition to coumadin (no DOAC due to interaction w/ her AEDs) - transition to lovenox bridge at time of transfer to SNF   Minimal bright red blood via trach  Appears to have ceased   DVT prophylaxis: IV heparin > Eliquis  Code Status: FULL CODE Family Communication: no family present at time of exam  Disposition Plan: awaiting SNF placement - hopeful for 3/6   Consultants:  Neurology PCCM  Antimicrobials:  Vancomycin 1/30 > 1/31 Zosyn 1/30 > 2/5 Cipro 2/17 > 2/24  Objective: Blood pressure 110/62, pulse 88, temperature 98.5 F (36.9 C), temperature source Oral, resp. rate (!) 28, height 5'  5" (1.651 m), weight 82.5 kg (181 lb 14.1 oz), SpO2 94 %.  Intake/Output Summary (Last 24 hours) at 09/11/2017 1506 Last data filed at 09/11/2017 1400 Gross per 24 hour  Intake 1585 ml  Output 850 ml  Net 735 ml   Filed Weights   09/08/17 0500 09/09/17 0500 09/10/17 0500  Weight: 82.6 kg (182 lb 1.6 oz) 81.5 kg (179 lb 10.8 oz) 82.5 kg (181 lb 14.1 oz)    Examination: General: No acute respiratory distress  Lungs: CTA th/o - no wheeze  Cardiovascular: RRR Abdomen: NT/ND, soft, BS+ Extremities: 1+ anasarca  w/o change   CBC: Recent Labs  Lab 09/06/17 0302 09/08/17 0514 09/09/17 0515 09/10/17 0354  WBC 9.4 11.8* 12.4* 10.5  HGB 10.5* 11.0* 11.0* 10.2*  HCT 32.2* 33.8* 34.0* 31.5*  MCV 93.9 94.7 94.7 94.6  PLT 414* 408* 411* 161   Basic Metabolic Panel: Recent Labs  Lab 09/06/17 0302 09/08/17 0514 09/09/17 0515 09/10/17 0354 09/11/17 0341  NA 135 136 133* 136 136  K 4.1 5.2* 4.2 3.9 3.9  CL 104 104 102 103 102  CO2 20* 20* 17* 20* 20*  GLUCOSE 156* 134* 179* 67 141*  BUN 48* 52* 52* 56* 59*  CREATININE 1.06* 1.17* 1.27* 1.21* 1.30*  CALCIUM 8.9 9.4 9.1 8.9 9.3  MG  --  2.0  --   --   --    GFR: Estimated Creatinine Clearance: 48.8 mL/min (A) (by C-G formula based on SCr of 1.3 mg/dL (H)).  HbA1C: Hgb A1c MFr Bld  Date/Time Value Ref Range Status  05/26/2017 10:35 PM 14.6 (H) 4.8 - 5.6 % Final    Comment:    (NOTE) Pre diabetes:          5.7%-6.4% Diabetes:              >6.4% Glycemic control for   <7.0% adults with diabetes   05/11/2017 08:08 AM 13.2 (H) 4.8 - 5.6 % Final    Comment:    (NOTE) Pre diabetes:          5.7%-6.4% Diabetes:              >6.4% Glycemic control for   <7.0% adults with diabetes     CBG: Recent Labs  Lab 09/11/17 0338 09/11/17 0715 09/11/17 0728 09/11/17 0830 09/11/17 1106  GLUCAP 136* 62* 61* 116* 198*    Scheduled Meds: . amLODipine  10 mg Per Tube Daily  . aspirin  81 mg Per Tube Daily  . chlorhexidine gluconate (MEDLINE KIT)  15 mL Mouth Rinse BID  . cloNIDine  0.2 mg Per Tube TID  . enalapril  30 mg Per Tube Daily  . feeding supplement (VITAL HIGH PROTEIN)  1,000 mL Per Tube Q24H  . furosemide  40 mg Per Tube BID  . hydrALAZINE  100 mg Per Tube Q8H  . insulin aspart  0-20 Units Subcutaneous Q4H  . insulin glargine  28 Units Subcutaneous BID  . labetalol  400 mg Per Tube TID  . lacosamide  200 mg Per Tube BID  . levETIRAcetam  1,000 mg Per Tube BID  . mouth rinse  15 mL Mouth Rinse QID  . mupirocin cream    Topical Daily  . pantoprazole sodium  40 mg Per Tube QHS  . phenytoin  250 mg Per Tube Q6H  . scopolamine  1 patch Transdermal Q72H  . thiamine  100 mg Per Tube Daily  . topiramate  200 mg Per Tube BID  .  warfarin  5 mg Oral ONCE-1800  . Warfarin - Pharmacist Dosing Inpatient   Does not apply q1800     LOS: 40 days   Cherene Altes, MD [facility] Office  [phone] Pager - Text Page per Amion as per below:  On-Call/Text Page:      [email or web address]      password [email or web address]  If 7PM-7AM, please contact night-coverage [email or web address] Password [email or web address] 09/11/2017, 3:06 PM

## 2017-09-11 NOTE — Progress Notes (Signed)
ANTICOAGULATION CONSULT NOTE  Pharmacy Consult:  Heparin Indication: DVT  No Known Allergies  Patient Measurements: Height: 5\' 5"  (165.1 cm) Weight: 181 lb 14.1 oz (82.5 kg) IBW/kg (Calculated) : 57 Heparin Dosing Weight: 76 kg  Vital Signs: Temp: 98.5 F (36.9 C) (03/05 0720) Temp Source: Oral (03/05 0720) BP: 104/52 (03/05 0815) Pulse Rate: 83 (03/05 0815)  Labs: Recent Labs    09/09/17 0515 09/10/17 0354 09/10/17 1623 09/11/17 0341  HGB 11.0* 10.2*  --   --   HCT 34.0* 31.5*  --   --   PLT 411* 333  --   --   HEPARINUNFRC 0.50 0.61 0.36  --   CREATININE 1.27* 1.21*  --  1.30*    Assessment: 66 YOF with 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 She was discovered to have RUE DVT. Pharmacy consulted to dose Heparin for anticoagulation.   Heparin switched to Eliquis last night and patient received one dose around 1700.  There is major DDI between Eliquis and Dilantin, therefore, will switch patient back to heparin bridge to Coumadin.  Last INR was 1.64 in February, likely elevated now s/p Eliquis.  No bleeding reported.   Goal of Therapy:  INR 2-3 Heparin level 0.3-0.7 units/ml Monitor platelets by anticoagulation protocol: Yes    Plan:  Restart heparin gtt at 1200 units/hr, no bolus Check 6 hr heparin level and PTT Daily heparin level, aPTT, CBC, PT / INR Coumadin 5mg  PO today Coumadin book  Consider reducing Dilantin dose to Q8H and repeat level in 5 days, reduce Lantus d/t hypoglycemia in the AM (no interruption with TF per RN).   Shawnee Higham D. Mina Marble, PharmD, BCPS Pager:  [phone] 09/11/2017, 8:28 AM

## 2017-09-11 NOTE — Progress Notes (Signed)
ANTICOAGULATION CONSULT NOTE  Pharmacy Consult:  Heparin Indication: DVT  No Known Allergies  Patient Measurements: Height: 5\' 5"  (165.1 cm) Weight: 181 lb 14.1 oz (82.5 kg) IBW/kg (Calculated) : 57 Heparin Dosing Weight: 76 kg  Vital Signs: Temp: 98.8 F (37.1 C) (03/05 1533) Temp Source: Oral (03/05 1533) BP: 117/64 (03/05 1623) Pulse Rate: 83 (03/05 1525)  Labs: Recent Labs    09/09/17 0515 09/10/17 0354 09/10/17 1623 09/11/17 0341 09/11/17 1634  HGB 11.0* 10.2*  --   --   --   HCT 34.0* 31.5*  --   --   --   PLT 411* 333  --   --   --   APTT  --   --   --   --  36  HEPARINUNFRC 0.50 0.61 0.36  --  0.24*  CREATININE 1.27* 1.21*  --  1.30*  --     Assessment: 35 YOF with 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 She was discovered to have RUE DVT. Pharmacy consulted to dose Heparin for anticoagulation.   Heparin switched to Eliquis last night and patient received one dose >  There is major DDI between Eliquis and Dilantin, therefore, will switch patient back to heparin bridge to Coumadin.    Heparin level 0.24 units/mL with aPTT 36 seconds- not correlating yet due to effects of Eliquis. No bleeding noted.  Goal of Therapy:  INR 2-3 Heparin level 0.3-0.7 units/ml Monitor platelets by anticoagulation protocol: Yes    Plan:  Increase heparin to 1350 units/hr Recheck heparin level and aPTT with AM labs  Delva Derden D. Sy Saintjean, PharmD, BCPS Clinical Pharmacist [phone] 09/11/2017 6:53 PM

## 2017-09-11 NOTE — Progress Notes (Signed)
Nutrition Follow-up  DOCUMENTATION CODES:   Obesity unspecified  INTERVENTION:   Tube Feeding:  Switch to Jevity 1.2 @ 55 ml/hr Pro-Stat 30 mL TID Provides 118 g of protein, 1884 kcals, 1069 mL of free water  Recommend scheduled bowel regimen as pt without BM x 1 week. Consider checking for impaction, administering enema   NUTRITION DIAGNOSIS:   Inadequate oral intake related to inability to eat as evidenced by NPO status.  Being addressed via TF  GOAL:   Patient will meet greater than or equal to 90% of their needs  Met  MONITOR:   Vent status, TF tolerance, Labs, Skin, Weight trends, I & O's  REASON FOR ASSESSMENT:   Consult Enteral/tube feeding initiation and management  ASSESSMENT:   62 yo female with PMH of DM, HTN, alcohol and cocaine abuse, L thalamic hemorrhage, insomnia disorder who was admitted on 1/24 after being found down; clinical seizures in the ED; required intubation in ED.  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  Estimated  Nutritional Needs:   Kcal:  1700-2000 kcals  Protein:  100-120 g  Fluid:  >/=1.7 L   Kerman Passey MS, RD, LDN, CNSC [phone] Pager  [phone] Weekend/On-Call Pager

## 2017-09-11 NOTE — Progress Notes (Signed)
Inpatient Diabetes Program Recommendations  AACE/ADA: New Consensus Statement on Inpatient Glycemic Control (2015)  Target Ranges:  Prepandial:   less than 140 mg/dL      Peak postprandial:   less than 180 mg/dL (1-2 hours)      Critically ill patients:  140 - 180 mg/dL    Results for SHARETHA, NEWSON (MRN [id number]) as of 09/11/2017 07:34  Ref. Range 09/10/2017 00:19 09/10/2017 03:53 09/10/2017 04:49 09/10/2017 07:14 09/10/2017 11:46 09/10/2017 15:01 09/10/2017 19:48 09/10/2017 22:52  Glucose-Capillary Latest Ref Range: 65 - 99 mg/dL 126 (H) 62 (L) 112 (H) 126 (H) 147 (H) 100 (H) 206 (H) 206 (H)   Results for TONILYNN, BIEKER (MRN [id number]) as of 09/11/2017 07:34  Ref. Range 09/11/2017 03:38 09/11/2017 07:15 09/11/2017 07:28  Glucose-Capillary Latest Ref Range: 65 - 99 mg/dL 136 (H) 62 (L) 61 (L)    Home DM Meds: 70/30 Insulin- 20 units BID  Current Insulin Orders: Lantus 28 units BID      Novolog Resistant Correction Scale/ SSI (0-20 units) Q4 hours      MD- Note patient had Hypoglycemia yesterday AM and again this AM.  Please consider reducing Lantus to 20 units BID      --Will follow patient during hospitalization--  Wyn Quaker RN, MSN, CDE Diabetes Coordinator Inpatient Glycemic Control Team Team Pager: [phone] (8a-5p)

## 2017-09-12 ENCOUNTER — Other Ambulatory Visit: Payer: Self-pay

## 2017-09-12 LAB — HEPARIN LEVEL (UNFRACTIONATED): HEPARIN UNFRACTIONATED: 0.35 [IU]/mL (ref 0.30–0.70)

## 2017-09-12 LAB — GLUCOSE, CAPILLARY
GLUCOSE-CAPILLARY: 148 mg/dL — AB (ref 65–99)
GLUCOSE-CAPILLARY: 202 mg/dL — AB (ref 65–99)
GLUCOSE-CAPILLARY: 175 mg/dL — AB (ref 65–99)
GLUCOSE-CAPILLARY: 142 mg/dL — AB (ref 65–99)
GLUCOSE-CAPILLARY: 192 mg/dL — AB (ref 65–99)
Glucose-Capillary: 205 mg/dL — ABNORMAL HIGH (ref 65–99)

## 2017-09-12 LAB — PROTIME-INR
INR: 1.18
PROTHROMBIN TIME: 14.9 s (ref 11.4–15.2)

## 2017-09-12 LAB — PHENYTOIN LEVEL, TOTAL: Phenytoin Lvl: 34.3 ug/mL (ref 10.0–20.0)

## 2017-09-12 LAB — CBC
HEMATOCRIT: 28.2 % — AB (ref 36.0–46.0)
HEMOGLOBIN: 9.1 g/dL — AB (ref 12.0–15.0)
MCH: 30 pg (ref 26.0–34.0)
MCHC: 32.3 g/dL (ref 30.0–36.0)
MCV: 93.1 fL (ref 78.0–100.0)
Platelets: 414 10*3/uL — ABNORMAL HIGH (ref 150–400)
RBC: 3.03 MIL/uL — ABNORMAL LOW (ref 3.87–5.11)
RDW: 15.5 % (ref 11.5–15.5)
WBC: 9.9 10*3/uL (ref 4.0–10.5)

## 2017-09-12 LAB — BASIC METABOLIC PANEL
Anion gap: 12 (ref 5–15)
BUN: 69 mg/dL — AB (ref 6–20)
CALCIUM: 9.1 mg/dL (ref 8.9–10.3)
CHLORIDE: 100 mmol/L — AB (ref 101–111)
CO2: 22 mmol/L (ref 22–32)
Creatinine, Ser: 1.49 mg/dL — ABNORMAL HIGH (ref 0.44–1.00)
GFR calc non Af Amer: 37 mL/min — ABNORMAL LOW (ref >60)
GFR, EST AFRICAN AMERICAN: 43 mL/min — AB (ref >60)
Glucose, Bld: 224 mg/dL — ABNORMAL HIGH (ref 65–99)
Potassium: 3.8 mmol/L (ref 3.5–5.1)
SODIUM: 134 mmol/L — AB (ref 135–145)

## 2017-09-12 LAB — APTT
aPTT: 47 seconds — ABNORMAL HIGH (ref 24–36)
aPTT: 85 seconds — ABNORMAL HIGH (ref 24–36)

## 2017-09-12 MED ORDER — WARFARIN SODIUM 7.5 MG PO TABS
7.5000 mg | ORAL_TABLET | Freq: Once | ORAL | Status: AC
  Administered 2017-09-12: 7.5 mg via ORAL
  Filled 2017-09-12: qty 1

## 2017-09-12 NOTE — Progress Notes (Addendum)
PROGRESS NOTE    [name]  WER:[id number] DOB: 05-20-1956 DOA: 08/02/2017 PCP: Nolene Ebbs, MD   Brief Narrative:  62 year old BF PMHx polysubstance abuse (EtOH, Cocaine), Diabetes mellitus type II uncontrolled with complication, HTN, Insomnia disorder, Shortness of breath.   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  She was found down today in a puddle of stool and urine.  She brought to our department emergency medicine where she had overt clinical seizure activity.  She was treated with Keppra fosphenytoin and Ativan in the clinical seizures have subsided.  EEG does not show ongoing seizure activity but shows decreased activity throughout the left hemisphere suggesting a postictal state.  She is unable to give any history and remains obtunded at this time.  Imaging studies did not show a new structural lesion.  She was coughing up purulent material in the department of emergency medicine.      Subjective: 3/6  eyes open unresponsive to painful stimuli      Assessment & Plan:   Active Problems:   Uncontrolled hypertension   Cerebrovascular accident (CVA) ([name])   Benign essential HTN   Status epilepticus (HCC)   Altered mental status   Cocaine abuse ([name])   Pressure injury of skin   Acute respiratory failure with hypoxemia (HCC)   Metabolic acidosis   Tracheostomy status (HCC)   Chronic diastolic CHF (congestive heart failure) (HCC)   Acute deep vein thrombosis (DVT) of right upper extremity (HCC)   Polysubstance abuse (EtOH, Cocaine)   CVA/Watershed infarct/Acute encephalopath -Patient now obtunded secondary to polysubstance abuse, noncompliance---> refractory seizures and CVA. -Extremely poor prognosis secondary significant brain injury. -See goals of care  Status epilepticus  -Last recorded seizure, 2/24 . -Lacosamide 200 mg bid -Keppra 1000 mg bid -Ativan PRN breakthrough seizures.  Positive  pseudomonas pneumonia -Completed course of treatment   Acute respiratory failure with hypoxemia -Vent dependent -Daily WUA/SBT:  -3/1 #6 cuffed--> #6 cuffless: Patient is never to be decannualted to downsized beyond this change given neuro status  -3/1 [facility] M has signed off   Chronic diastolic CHF -Per neurology target SBP<140 -Amlodipine 10 mg daily -Clonidine 0.2 mg TID -Enalapril 30 mg daily  -Hydralazine '100mg'$  TID -Labetalol 400 mg TID  Essential HTN  -see CHF    Hypernatremia  -3/1 decrease Free water 100 ml TID   Hypokalemia -Potassium goal> 4  Hypomagnesemia -Magnesium goal> 2  Anemia,    Acute RIGHT upper extremity DVT -Bilateral upper extremity swelling RIGHT>> LEFT. RIGHT upper extremity has PICC line. Will obtain bilateral upper extremity Doppler rule out DVT. -ADDENDUM: Positive R UE DVT. Spoke with neurology Dr. Rory Percy, and from a neurologic standpoint (thalamic hemorrhage November 2018), okay to start anticoagulation. Heparin per pharmacy.  Diabetes type 2 uncontrolled with complication -19/50/9326 Hemoglobin A1c= 14.6 -2/24 increase Lantus 33 units BID -Resistant SSI      Goals of care  -CSW currently working on placement after discussing with daughter options. Family would like patient to be placed close to family in the [facility] area.   -2/13 PALLIATIVE CARE: Consult placed polysubstance abuse (EtOH, Cocaine), hemorrhagic stroke, severe brain injury, continues to require full vent support. Obtunded CODE STATUS change, hospice -2/14 palliative care attempted to contact family unsuccessfully. Given patient only has Medicaid of [place] very poor likelihood that we will be able to transfer patient to [place]. Spoke with [name] from [place] will not except patient in LTAC or vent SNF.  DVT prophylaxis: SCD Code Status: Full Family Communication: None Disposition Plan: TBD   Consultants:  Neurology [facility] M    Procedures/Significant  Events:  1/24 EEG:-Abnormal , evidence of moderate generalized slowing of brain activity as well as severe focal slowing in the left temporal lobe.  The patient is not in non-convulsive status epilepticus. 2D echo 1/25>>> EF 54-27%, grade 2 diastolic dysfunction  0/62 MRI brain  No acute intracranial abnormality. 2. 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 3. Remote hemorrhage in the left thalamus and left pons. 4. Age advanced atrophy. 5. Fluid in the nasopharynx and paranasal sinuses is likely secondary to intubation. 2/21 R UE duplex:- Positive for acute DVT subclavian vein, axillary vein and proximal brachial veins.        I have personally reviewed and interpreted all radiology studies and my findings are as above.  VENTILATOR SETTINGS: PRVC Vt set: 536m Set rate: 15 FiO2: 30% PEEP; 5 cm H2O    Cultures 1/30 Blood Cx negative 1/30 Respiratory positive Psuedomonas Aeruginosa    Antimicrobials: Anti-infectives (From admission, onward)   Start     Stop   08/17/17 1145  ceFAZolin (ANCEF) IVPB 1 g/50 mL premix     08/17/17 1144   08/17/17 1120  ceFAZolin (ANCEF) powder 1 g  Status:  Discontinued     08/17/17 1141   08/17/17 1119  ceFAZolin (ANCEF) IVPB 2g/100 mL premix  Status:  Discontinued     08/17/17 1132   08/17/17 1057  ceFAZolin (ANCEF) 2-4 GM/100ML-% IVPB    Comments:  BCamelia Phenes  : cabinet override   08/17/17 1131   08/17/17 0600  ceFAZolin (ANCEF) IVPB 2g/100 mL premix     08/17/17 0553   08/16/17 1530  ceFAZolin (ANCEF) IVPB 2g/100 mL premix  Status:  Discontinued     08/16/17 1526   08/08/17 2300  vancomycin (VANCOCIN) IVPB 750 mg/150 ml premix  Status:  Discontinued     08/10/17 0923   08/08/17 1400  piperacillin-tazobactam (ZOSYN) IVPB 3.375 g  Status:  Discontinued     08/14/17 0745   08/08/17 1100  vancomycin (VANCOCIN) 2,000 mg in sodium chloride 0.9 % 500 mL IVPB     08/08/17 1253       Devices    LINES / TUBES:  CVC  1/24>>>2/6 ETT 1/24>>>2/6 NG/OG 1/24>>> Tracheostomy 2/6>> PICC 2/6 >> #6 cuffless 3/1>>    Continuous Infusions: . sodium chloride 10 mL/hr at 09/12/17 0600  . feeding supplement (JEVITY 1.2 CAL) 1,000 mL (09/12/17 0600)  . heparin 1,550 Units/hr (09/12/17 0604)     Objective: Vitals:   09/12/17 0600 09/12/17 0604 09/12/17 0605 09/12/17 0722  BP: 130/70 130/70 130/70   Pulse:  85    Resp: (!) 22     Temp:    98.3 F (36.8 C)  TempSrc:      SpO2: 97%     Weight:      Height:        Intake/Output Summary (Last 24 hours) at 09/12/2017 0749 Last data filed at 09/12/2017 0600 Gross per 24 hour  Intake 1894.78 ml  Output 900 ml  Net 994.78 ml   Filed Weights   09/09/17 0500 09/10/17 0500 09/12/17 0440  Weight: 179 lb 10.8 oz (81.5 kg) 181 lb 14.1 oz (82.5 kg) 181 lb 3.5 oz (82.2 kg)      Physical Exam:  General: Obtunded, positive acute respiratory distress Neck:  Negative scars, masses, torticollis, lymphadenopathy, JVD, #6 cuffless trach  in place covered and clean negative sign of infection. Lungs: Clear to auscultation bilaterally without wheezes or crackles Cardiovascular: Regular rate and rhythm without murmur gallop or rub normal S1 and S2 Abdomen: negative abdominal pain, nondistended, positive soft, bowel sounds, no rebound, no ascites, no appreciable mass, PEG tube in place covered and clean negative sign of infection. Extremities: No significant cyanosis, clubbing, or edema bilateral lower extremities Skin: Negative rashes, lesions, ulcers Psychiatric:  Unable to assess secondary to CVA  Central nervous system:  Grimaces slightly with painful stimuli. Unable to assess further secondary to severe CVA      .     Data Reviewed: Care during the described time interval was provided by me .  I have reviewed this patient's available data, including medical history, events of note, physical examination, and all test results as part of my evaluation.    CBC: Recent Labs  Lab 09/06/17 0302 09/08/17 0514 09/09/17 0515 09/10/17 0354 09/12/17 0336  WBC 9.4 11.8* 12.4* 10.5 9.9  HGB 10.5* 11.0* 11.0* 10.2* 9.1*  HCT 32.2* 33.8* 34.0* 31.5* 28.2*  MCV 93.9 94.7 94.7 94.6 93.1  PLT 414* 408* 411* 333 170*   Basic Metabolic Panel: Recent Labs  Lab 09/08/17 0514 09/09/17 0515 09/10/17 0354 09/11/17 0341 09/12/17 0336  NA 136 133* 136 136 134*  K 5.2* 4.2 3.9 3.9 3.8  CL 104 102 103 102 100*  CO2 20* 17* 20* 20* 22  GLUCOSE 134* 179* 67 141* 224*  BUN 52* 52* 56* 59* 69*  CREATININE 1.17* 1.27* 1.21* 1.30* 1.49*  CALCIUM 9.4 9.1 8.9 9.3 9.1  MG 2.0  --   --   --   --    GFR: Estimated Creatinine Clearance: 42.5 mL/min (A) (by C-G formula based on SCr of 1.49 mg/dL (H)). Liver Function Tests: No results for input(s): AST, ALT, ALKPHOS, BILITOT, PROT, ALBUMIN in the last 168 hours. No results for input(s): LIPASE, AMYLASE in the last 168 hours. No results for input(s): AMMONIA in the last 168 hours. Coagulation Profile: Recent Labs  Lab 09/12/17 0336  INR 1.18   Cardiac Enzymes: No results for input(s): CKTOTAL, CKMB, CKMBINDEX, TROPONINI in the last 168 hours. BNP (last 3 results) No results for input(s): PROBNP in the last 8760 hours. HbA1C: No results for input(s): HGBA1C in the last 72 hours. CBG: Recent Labs  Lab 09/11/17 1532 09/11/17 1914 09/11/17 2340 09/12/17 0323 09/12/17 0726  GLUCAP 149* 165* 184* 205* 148*   Lipid Profile: No results for input(s): CHOL, HDL, LDLCALC, TRIG, CHOLHDL, LDLDIRECT in the last 72 hours. Thyroid Function Tests: No results for input(s): TSH, T4TOTAL, FREET4, T3FREE, THYROIDAB in the last 72 hours. Anemia Panel: No results for input(s): VITAMINB12, FOLATE, FERRITIN, TIBC, IRON, RETICCTPCT in the last 72 hours. Urine analysis:    Component Value Date/Time   COLORURINE YELLOW 08/05/2017 1216   APPEARANCEUR HAZY (A) 08/05/2017 1216   LABSPEC 1.017 08/05/2017 1216    PHURINE 5.0 08/05/2017 1216   GLUCOSEU NEGATIVE 08/05/2017 1216   GLUCOSEU > 1000 mg/dL (A) 11/01/2007 2056   HGBUR NEGATIVE 08/05/2017 1216   HGBUR negative 07/15/2007 0954   BILIRUBINUR NEGATIVE 08/05/2017 1216   KETONESUR NEGATIVE 08/05/2017 1216   PROTEINUR 30 (A) 08/05/2017 1216   UROBILINOGEN 1.0 04/09/2012 1524   NITRITE NEGATIVE 08/05/2017 1216   LEUKOCYTESUR TRACE (A) 08/05/2017 1216   Sepsis Labs: '@LABRCNTIP'$ (procalcitonin:4,lacticidven:4)  ) No results found for this or any previous visit (from the past 240 hour(s)).  Radiology Studies: No results found.      Scheduled Meds: . amLODipine  10 mg Per Tube Daily  . aspirin  81 mg Per Tube Daily  . chlorhexidine gluconate (MEDLINE KIT)  15 mL Mouth Rinse BID  . cloNIDine  0.2 mg Per Tube TID  . enalapril  30 mg Per Tube Daily  . feeding supplement (PRO-STAT SUGAR FREE 64)  30 mL Per Tube TID  . furosemide  40 mg Per Tube BID  . hydrALAZINE  100 mg Per Tube Q8H  . insulin aspart  0-20 Units Subcutaneous Q4H  . insulin glargine  20 Units Subcutaneous BID  . labetalol  400 mg Per Tube TID  . lacosamide  200 mg Per Tube BID  . levETIRAcetam  1,000 mg Per Tube BID  . mouth rinse  15 mL Mouth Rinse QID  . mupirocin cream   Topical Daily  . pantoprazole sodium  40 mg Per Tube QHS  . phenytoin  250 mg Per Tube Q6H  . scopolamine  1 patch Transdermal Q72H  . thiamine  100 mg Per Tube Daily  . topiramate  200 mg Per Tube BID  . Warfarin - Pharmacist Dosing Inpatient   Does not apply q1800   Continuous Infusions: . sodium chloride 10 mL/hr at 09/12/17 0600  . feeding supplement (JEVITY 1.2 CAL) 1,000 mL (09/12/17 0600)  . heparin 1,550 Units/hr (09/12/17 0604)     LOS: 41 days    Time spent: 40 minutes    WOODS, Geraldo Docker, MD [facility] Pager [phone]   If 7PM-7AM, please contact night-coverage [email or web address] Password [facility] 09/12/2017, 7:49 AM

## 2017-09-12 NOTE — Progress Notes (Addendum)
ANTICOAGULATION CONSULT NOTE  Pharmacy Consult:  Heparin Indication: DVT  No Known Allergies  Patient Measurements: Height: 5\' 5"  (165.1 cm) Weight: 181 lb 3.5 oz (82.2 kg) IBW/kg (Calculated) : 57 Heparin Dosing Weight: 76 kg  Vital Signs: Temp: 98.6 F (37 C) (03/06 1051) Temp Source: Oral (03/06 1051) BP: 126/61 (03/06 1103) Pulse Rate: 79 (03/06 1103)  Labs: Recent Labs    09/10/17 0354 09/10/17 1623 09/11/17 0341 09/11/17 1634 09/12/17 0336 09/12/17 1105  HGB 10.2*  --   --   --  9.1*  --   HCT 31.5*  --   --   --  28.2*  --   PLT 333  --   --   --  414*  --   APTT  --   --   --  36 47* 85*  LABPROT  --   --   --   --  14.9  --   INR  --   --   --   --  1.18  --   HEPARINUNFRC 0.61 0.36  --  0.24* 0.35  --   CREATININE 1.21*  --  1.30*  --  1.49*  --     Assessment: 32 YOF with 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 She was discovered to have RUE DVT. Pharmacy consulted to manage heparin bridge to Coumadin.  INR is sub-therapeutic as expected.  aPTT within desired range.  No bleeding reported.   Goal of Therapy:  INR 2-3 Heparin level 0.3-0.7 units/ml  APTT 66-102 sec Monitor platelets by anticoagulation protocol: Yes    Plan:  Continue heparin gtt at 1550 units/hr Coumadin 7.5mg  PO today Daily heparin level, aPTT, CBC, PT / INR Consider reducing Dilantin   Selso Mannor D. Mina Marble, PharmD, BCPS Pager:  [phone] 09/12/2017, 12:34 PM

## 2017-09-12 NOTE — Progress Notes (Signed)
ANTICOAGULATION CONSULT NOTE  Pharmacy Consult:  Heparin Indication: DVT  No Known Allergies  Patient Measurements: Height: 5\' 5"  (165.1 cm) Weight: 181 lb 3.5 oz (82.2 kg) IBW/kg (Calculated) : 57 Heparin Dosing Weight: 76 kg  Vital Signs: Temp: 98.7 F (37.1 C) (03/06 0324) Temp Source: Oral (03/06 0324) BP: 130/65 (03/05 2252) Pulse Rate: 89 (03/06 0309)  Labs: Recent Labs    09/10/17 0354 09/10/17 1623 09/11/17 0341 09/11/17 1634 09/12/17 0336  HGB 10.2*  --   --   --  9.1*  HCT 31.5*  --   --   --  28.2*  PLT 333  --   --   --  414*  APTT  --   --   --  36 47*  LABPROT  --   --   --   --  14.9  INR  --   --   --   --  1.18  HEPARINUNFRC 0.61 0.36  --  0.24* 0.35  CREATININE 1.21*  --  1.30*  --   --     Assessment: 26 YOF with 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 She was discovered to have RUE DVT. Pharmacy consulted to dose Heparin for anticoagulation.   Heparin switched to Eliquis, patient received one dose and then decision was made to actually transition patient to warfarin (with heparin bridge) due to DDI between Eliquis and Dilantin.  APTT is subtherapeutic, HL is wnl but will continue to use aPTT given recent Eliquis exposure.  Goal of Therapy:  INR 2-3 Heparin level 0.3-0.7 units/ml Monitor platelets by anticoagulation protocol: Yes  APTT 66-102    Plan:  Increase heparin to 1550 units/hr Check 6 hour levels Daily HL, CBC   Iban Utz, Jake Church 09/12/2017 5:17 AM

## 2017-09-12 NOTE — Progress Notes (Signed)
Pt had be placed back on IV heparin which cannot be taken at SNF- MD states if pt can have daily INR checks and have their MD adjust coumadin until therapeutic she should be able to DC to [facility] updated admissions coordinator who is checking with nursing staff if they are capable of doing this.  CSW will continue to follow  [name], LCSW Clinical Social Worker [phone]

## 2017-09-13 DIAGNOSIS — J151 Pneumonia due to Pseudomonas: Secondary | ICD-10-CM | POA: Diagnosis not present

## 2017-09-13 DIAGNOSIS — IMO0002 Reserved for concepts with insufficient information to code with codable children: Secondary | ICD-10-CM | POA: Diagnosis present

## 2017-09-13 DIAGNOSIS — I6389 Other cerebral infarction: Secondary | ICD-10-CM | POA: Diagnosis present

## 2017-09-13 DIAGNOSIS — E1165 Type 2 diabetes mellitus with hyperglycemia: Secondary | ICD-10-CM | POA: Diagnosis present

## 2017-09-13 DIAGNOSIS — I10 Essential (primary) hypertension: Secondary | ICD-10-CM | POA: Diagnosis present

## 2017-09-13 DIAGNOSIS — E118 Type 2 diabetes mellitus with unspecified complications: Secondary | ICD-10-CM

## 2017-09-13 DIAGNOSIS — J9601 Acute respiratory failure with hypoxia: Secondary | ICD-10-CM | POA: Diagnosis present

## 2017-09-13 LAB — BASIC METABOLIC PANEL
Anion gap: 13 (ref 5–15)
BUN: 80 mg/dL — AB (ref 6–20)
CALCIUM: 9.1 mg/dL (ref 8.9–10.3)
CO2: 21 mmol/L — ABNORMAL LOW (ref 22–32)
CREATININE: 1.42 mg/dL — AB (ref 0.44–1.00)
Chloride: 102 mmol/L (ref 101–111)
GFR calc Af Amer: 45 mL/min — ABNORMAL LOW (ref >60)
GFR, EST NON AFRICAN AMERICAN: 39 mL/min — AB (ref >60)
GLUCOSE: 176 mg/dL — AB (ref 65–99)
Potassium: 3.8 mmol/L (ref 3.5–5.1)
SODIUM: 136 mmol/L (ref 135–145)

## 2017-09-13 LAB — GLUCOSE, CAPILLARY
GLUCOSE-CAPILLARY: 243 mg/dL — AB (ref 65–99)
Glucose-Capillary: 172 mg/dL — ABNORMAL HIGH (ref 65–99)
Glucose-Capillary: 177 mg/dL — ABNORMAL HIGH (ref 65–99)
Glucose-Capillary: 183 mg/dL — ABNORMAL HIGH (ref 65–99)
Glucose-Capillary: 182 mg/dL — ABNORMAL HIGH (ref 65–99)
Glucose-Capillary: 212 mg/dL — ABNORMAL HIGH (ref 65–99)
Glucose-Capillary: 200 mg/dL — ABNORMAL HIGH (ref 65–99)

## 2017-09-13 LAB — CBC
HCT: 28.9 % — ABNORMAL LOW (ref 36.0–46.0)
Hemoglobin: 9.3 g/dL — ABNORMAL LOW (ref 12.0–15.0)
MCH: 29.7 pg (ref 26.0–34.0)
MCHC: 32.2 g/dL (ref 30.0–36.0)
MCV: 92.3 fL (ref 78.0–100.0)
PLATELETS: 428 10*3/uL — AB (ref 150–400)
RBC: 3.13 MIL/uL — ABNORMAL LOW (ref 3.87–5.11)
RDW: 15.5 % (ref 11.5–15.5)
WBC: 9.9 10*3/uL (ref 4.0–10.5)

## 2017-09-13 LAB — HEPARIN LEVEL (UNFRACTIONATED): Heparin Unfractionated: 0.37 IU/mL (ref 0.30–0.70)

## 2017-09-13 LAB — PROTIME-INR
INR: 1.16
PROTHROMBIN TIME: 14.7 s (ref 11.4–15.2)

## 2017-09-13 LAB — MAGNESIUM: MAGNESIUM: 1.8 mg/dL (ref 1.7–2.4)

## 2017-09-13 LAB — APTT: aPTT: 59 seconds — ABNORMAL HIGH (ref 24–36)

## 2017-09-13 MED ORDER — THIAMINE HCL 100 MG PO TABS: 100.0000 mg | ORAL_TABLET | Freq: Every day | ORAL | 0 refills | Status: DC

## 2017-09-13 MED ORDER — INSULIN GLARGINE 100 UNIT/ML ~~LOC~~ SOLN: 20.0000 [IU] | Freq: Two times a day (BID) | SUBCUTANEOUS | 0 refills | Status: DC

## 2017-09-13 MED ORDER — WARFARIN SODIUM 5 MG PO TABS: 5.0000 mg | ORAL_TABLET | Freq: Every day | ORAL | Status: DC

## 2017-09-13 MED ORDER — PANTOPRAZOLE SODIUM 40 MG PO PACK: 40.0000 mg | PACK | Freq: Every day | ORAL | 0 refills | Status: DC

## 2017-09-13 MED ORDER — SCOPOLAMINE 1 MG/3DAYS TD PT72: 1.0000 | MEDICATED_PATCH | TRANSDERMAL | 0 refills | Status: AC

## 2017-09-13 MED ORDER — WARFARIN SODIUM 7.5 MG PO TABS
7.5000 mg | ORAL_TABLET | Freq: Once | ORAL | Status: AC
  Administered 2017-09-13: 7.5 mg via ORAL
  Filled 2017-09-13 (×2): qty 1

## 2017-09-13 MED ORDER — LACOSAMIDE 200 MG PO TABS: 200.0000 mg | ORAL_TABLET | Freq: Two times a day (BID) | ORAL | 0 refills | Status: AC

## 2017-09-13 MED ORDER — ACETAMINOPHEN 160 MG/5ML PO SOLN: 325.0000 mg | Freq: Four times a day (QID) | ORAL | 0 refills | Status: AC | PRN

## 2017-09-13 MED ORDER — CHLORHEXIDINE GLUCONATE 0.12 % MT SOLN
15.0000 mL | Freq: Two times a day (BID) | OROMUCOSAL | Status: DC
  Administered 2017-09-13 – 2017-09-14 (×2): 15 mL via OROMUCOSAL
  Filled 2017-09-13: qty 15

## 2017-09-13 MED ORDER — LABETALOL HCL 200 MG PO TABS: 400.0000 mg | ORAL_TABLET | Freq: Three times a day (TID) | ORAL | 0 refills | Status: DC

## 2017-09-13 MED ORDER — DOCUSATE SODIUM 50 MG/5ML PO LIQD
50.0000 mg | Freq: Two times a day (BID) | ORAL | 0 refills | Status: DC | PRN
Start: 2017-09-13 — End: 2017-12-18

## 2017-09-13 MED ORDER — HYDRALAZINE HCL 100 MG PO TABS: 100.0000 mg | ORAL_TABLET | Freq: Three times a day (TID) | ORAL | 0 refills | Status: DC

## 2017-09-13 MED ORDER — LORAZEPAM 2 MG/ML IJ SOLN: 1.0000 mg | INTRAMUSCULAR | 0 refills | Status: DC | PRN

## 2017-09-13 MED ORDER — PRO-STAT SUGAR FREE PO LIQD: 30.0000 mL | Freq: Three times a day (TID) | ORAL | 0 refills | Status: DC

## 2017-09-13 MED ORDER — PHENYTOIN 125 MG/5ML PO SUSP: 250.0000 mg | Freq: Four times a day (QID) | ORAL | 0 refills | Status: DC

## 2017-09-13 MED ORDER — JEVITY 1.2 CAL PO LIQD: 1000.0000 mL | ORAL | 0 refills | Status: DC

## 2017-09-13 MED ORDER — FUROSEMIDE 40 MG PO TABS: 40.0000 mg | ORAL_TABLET | Freq: Two times a day (BID) | ORAL | 0 refills | Status: DC

## 2017-09-13 MED ORDER — ORAL CARE MOUTH RINSE
15.0000 mL | Freq: Two times a day (BID) | OROMUCOSAL | Status: DC
  Administered 2017-09-14: 15 mL via OROMUCOSAL

## 2017-09-13 MED ORDER — ENOXAPARIN SODIUM 80 MG/0.8ML ~~LOC~~ SOLN
80.0000 mg | Freq: Two times a day (BID) | SUBCUTANEOUS | Status: DC
  Administered 2017-09-13 – 2017-09-14 (×3): 80 mg via SUBCUTANEOUS
  Filled 2017-09-13 (×3): qty 0.8

## 2017-09-13 MED ORDER — ATORVASTATIN CALCIUM 40 MG PO TABS: 40.0000 mg | ORAL_TABLET | Freq: Every day | ORAL | 0 refills | Status: DC

## 2017-09-13 MED ORDER — LEVETIRACETAM 100 MG/ML PO SOLN: 1000.0000 mg | Freq: Two times a day (BID) | ORAL | 0 refills | Status: AC

## 2017-09-13 MED ORDER — PHENYTOIN 125 MG/5ML PO SUSP
250.0000 mg | Freq: Four times a day (QID) | ORAL | Status: DC
  Filled 2017-09-13 (×2): qty 10

## 2017-09-13 MED ORDER — ENALAPRIL MALEATE 10 MG PO TABS: 30.0000 mg | ORAL_TABLET | Freq: Every day | ORAL | 0 refills | Status: DC

## 2017-09-13 MED ORDER — WARFARIN SODIUM 5 MG PO TABS: 5.0000 mg | ORAL_TABLET | Freq: Every day | ORAL | 0 refills | Status: DC

## 2017-09-13 MED ORDER — ENOXAPARIN SODIUM 80 MG/0.8ML ~~LOC~~ SOLN: 80.0000 mg | Freq: Two times a day (BID) | SUBCUTANEOUS | 0 refills | Status: DC

## 2017-09-13 MED ORDER — TOPIRAMATE 200 MG PO TABS: 200.0000 mg | ORAL_TABLET | Freq: Two times a day (BID) | ORAL | 0 refills | Status: DC

## 2017-09-13 MED ORDER — CLONIDINE HCL 0.2 MG PO TABS: 0.2000 mg | ORAL_TABLET | Freq: Three times a day (TID) | ORAL | 0 refills | Status: DC

## 2017-09-13 MED ORDER — ASPIRIN 81 MG PO CHEW: 81.0000 mg | CHEWABLE_TABLET | Freq: Every day | ORAL | 0 refills | Status: DC

## 2017-09-13 MED ORDER — ALBUTEROL SULFATE (2.5 MG/3ML) 0.083% IN NEBU: 2.5000 mg | INHALATION_SOLUTION | RESPIRATORY_TRACT | 0 refills | Status: AC | PRN

## 2017-09-13 MED ORDER — AMLODIPINE BESYLATE 10 MG PO TABS: 10.0000 mg | ORAL_TABLET | Freq: Every day | ORAL | 0 refills | Status: DC

## 2017-09-13 NOTE — Progress Notes (Signed)
  Speech Language Pathology Treatment: [name] Speaking valve  Patient Details Name: [name] MRN: [id number] DOB: 1955/07/11 Today's Date: 09/13/2017 Time: 0076-2263 SLP Time Calculation (min) (ACUTE ONLY): 12 min  Assessment / Plan / Recommendation Clinical Impression  Pt presents with continued limited tolerance of PMV, wearing PMV for ~ 1 min for three trials. PMV was removed by SLP when pt had increases in WOB; however, pt had minimal back pressure with removal. Pt had fluctuations in O2 throughout session with and without PMV in place reaching as low as 86. Further, pt had audibile secretions that she was able to expectorate for tracheal suctioning once. She did not cough on command when cued to do so. Pt will potentially be more successful with wearing PMV with reduced secretions and smaller trach. Given limited progress with PMV, SLP provided nonverbal functional communication tasks (basic commands) to pt, but pt was unresponsive despite max verbal/visual/tactile cues. Continue to recommend SLP to further assess PMV readiness and potential ways of communication.       HPI HPI: This is a 62 year old diabetic with a history of alcohol and cocaine abuse who is previously been admitted to this hospital for a left thalamic hemorrhage. She was found down today in a puddle of stool and urine. She brought to our department emergency medicine where she had overt clinical seizure activity.She was treated with Keppra fosphenytoin and Ativan in the clinical seizures have subsided. EEG does not show ongoing seizure activity but shows decreased activity throughout the left hemisphere suggesting a postictal state. She is unable to give any history and remains obtunded at this time. Imaging studies did not show a new structural lesion. She was coughing up purulent material in the department of emergency medicine. MRI on 08/11/17 revealed acute watershed infarcts in both cerebral hemispheres as  well as chronic infarcts and old hemorrhages involving the pons and thalami. Pt intubated 1/24, trach placed 2/6 and has been on trach collar for last 24 hours. Pt currently has 62mm Shiley cuffed trach. Pt has PEG.       SLP Plan  Continue with current plan of care       Recommendations         Patient may use Passy-Muir Speech Valve: with SLP only PMSV Supervision: Full MD: Please consider changing trach tube to : Smaller size         Oral Care Recommendations: Oral care QID Follow up Recommendations: Skilled Nursing facility SLP Visit Diagnosis: Aphonia (R49.1) Plan: Continue with current plan of care       GO              [place] Itzia Cunliffe SLP Student Clinician   [place] Jodi Criscuolo 09/13/2017, 2:13 PM

## 2017-09-13 NOTE — Discharge Summary (Addendum)
Physician Discharge Summary  [name] QJF:[id number] DOB: [birth year] DOA: 08/02/2017  PCP: Nolene Ebbs, MD  Admit date: 08/02/2017 Discharge date: 09/13/2017  Time spent: 35 minutes  Recommendations for Outpatient Follow-up:   Polysubstance abuse (EtOH, Cocaine)     CVA/Watershed infarct/Acute encephalopath -Patient now obtunded secondary to polysubstance abuse, noncompliance---> refractory seizures and CVA. -Extremely poor prognosis secondary significant brain injury.   Status epilepticus  -Last recorded seizure, 2/24 . -Lacosamide 200 mg bid -Keppra 1000 mg bid -Phenytoin 250 mg QID. NOTE: Has been held secondary to elevated Dilantin level. On 3/6 phenytoin level= 34.3 g/ml.  -Obtain DAILY phenytoin level and restart when phenytoin level< 20 g/ml. Goal= 10-20 g/ml.  -Ativan PRN breakthrough seizures.   Positive pseudomonas pneumonia -Completed course of treatment    Acute respiratory failure with hypoxemia -3/1 #6 cuffed--> #6 cuffless: Patient is never to be decannualted or downsized beyond this change given neuro status    Chronic diastolic CHF -Per neurology target SBP<140 -Amlodipine 10 mg daily -Clonidine 0.2 mg TID -Enalapril 30 mg daily  -Lasix 40 mg BID -Hydralazine 100mg  TID -Labetalol 400 mg TID   Essential HTN  -see CHF    Hypernatremia  -Free water 100 ml TID    Hypokalemia -Potassium goal> 4   Hypomagnesemia -Magnesium goal> 2   Anemia,     Acute RIGHT upper extremity DVT -Bilateral upper extremity swelling RIGHT>> LEFT. RIGHT upper extremity has PICC line. Will obtain bilateral upper extremity Doppler rule out DVT. -Lovenox 80 mg BID, until therapeutic on Coumadin -Coumadin 5 mg daily. Check daily INR. And adjust for INR goal of 2-3   Diabetes type 2 uncontrolled with complication -89/37/3428 Hemoglobin A1c= 14.6 -Lantus 20 units BID     Discharge Diagnoses:  Active Problems:   Uncontrolled hypertension   Cerebrovascular  accident (CVA) ([place])   Benign essential HTN   Status epilepticus (HCC)   Altered mental status   Cocaine abuse (HCC)   Pressure injury of skin   Acute respiratory failure with hypoxemia (HCC)   Metabolic acidosis   Tracheostomy status (HCC)   Chronic diastolic CHF (congestive heart failure) (HCC)   Acute deep vein thrombosis (DVT) of right upper extremity [facility])   Discharge Condition: Guarded   Diet recommendation: Tube feed  Filed Weights   09/10/17 0500 09/12/17 0440 09/13/17 0500  Weight: 181 lb 14.1 oz (82.5 kg) 181 lb 3.5 oz (82.2 kg) 181 lb 14.1 oz (82.5 kg)    History of present illness:  62 year old BF PMHx polysubstance abuse (EtOH, Cocaine), Diabetes mellitus type II uncontrolled with complication, HTN, Insomnia disorder, Shortness of breath.   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  She was found down today in a puddle of stool and urine.  She brought to our department emergency medicine where she had overt clinical seizure activity.  She was treated with Keppra fosphenytoin and Ativan in the clinical seizures have subsided.  EEG does not show ongoing seizure activity but shows decreased activity throughout the left hemisphere suggesting a postictal state.  She is unable to give any history and remains obtunded at this time.  Imaging studies did not show a new structural lesion.  She was coughing up purulent material in the department of emergency medicine.  His hospitalization diagnosed with watershed infarct secondary to EtOH, cocaine abuse. In addition patient was treated for Pseudomonas pneumonia. Hospitalization complicated requirement for intubation and eventually need for tracheostomy. Patient now tracheostomy dependent. Prognosis poor. Stable for discharge.  Procedures: 1/24 EEG:-Abnormal , evidence of moderate generalized slowing of brain activity as well as severe focal slowing in the left temporal lobe.  The  patient is not in non-convulsive status epilepticus. 2D echo 1/25>>> EF 11-91%, grade 2 diastolic dysfunction  4/78 MRI brain  No acute intracranial abnormality. 2. 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 3. Remote hemorrhage in the left thalamus and left pons. 4. Age advanced atrophy. 5. Fluid in the nasopharynx and paranasal sinuses is likely secondary to intubation. 2/21 R UE duplex:- Positive for acute DVT subclavian vein, axillary vein and proximal brachial veins.     Consultations: Stroke team PC CM   Cultures   1/30 Blood Cx negative 1/30 Respiratory positive Psuedomonas Aeruginosa   Antibiotics Anti-infectives (From admission, onward)   Start     Stop   08/26/17 1615  ciprofloxacin (CIPRO) IVPB 400 mg  Status:  Discontinued     09/03/17 1515   08/17/17 1145  ceFAZolin (ANCEF) IVPB 1 g/50 mL premix     08/17/17 1144   08/17/17 1120  ceFAZolin (ANCEF) powder 1 g  Status:  Discontinued     08/17/17 1141   08/17/17 1119  ceFAZolin (ANCEF) IVPB 2g/100 mL premix  Status:  Discontinued     08/17/17 1132   08/17/17 1057  ceFAZolin (ANCEF) 2-4 GM/100ML-% IVPB    Comments:  Camelia Phenes   : cabinet override   08/17/17 1131   08/17/17 0600  ceFAZolin (ANCEF) IVPB 2g/100 mL premix     08/17/17 0553   08/16/17 1530  ceFAZolin (ANCEF) IVPB 2g/100 mL premix  Status:  Discontinued     08/16/17 1526   08/08/17 2300  vancomycin (VANCOCIN) IVPB 750 mg/150 ml premix  Status:  Discontinued     08/10/17 0923   08/08/17 1400  piperacillin-tazobactam (ZOSYN) IVPB 3.375 g  Status:  Discontinued     08/14/17 0745   08/08/17 1100  vancomycin (VANCOCIN) 2,000 mg in sodium chloride 0.9 % 500 mL IVPB     08/08/17 1253      Lines/Tubes CVC 1/24>>>2/6 ETT 1/24>>>2/6 NG/OG 1/24>>> Tracheostomy 2/6>> PICC 2/6 >> #6 cuffless 3/1>>        Discharge Exam: Vitals:   09/13/17 1140 09/13/17 1145 09/13/17 1221 09/13/17 1300  BP: 117/62   (!) 182/100  Pulse: 88      Resp: (!) 27 (!) 29  (!) 30  Temp:   99.3 F (37.4 C)   TempSrc:   Oral   SpO2: 95% 92%  95%  Weight:      Height:        General: Obtunded, positive acute respiratory distress Neck:  Negative scars, masses, torticollis, lymphadenopathy, JVD, #6 cuffless trach in place covered and clean negative sign of infection. Lungs: Clear to auscultation bilaterally without wheezes or crackles Cardiovascular: Regular rate and rhythm without murmur gallop or rub normal S1 and S2    Discharge Instructions   Allergies as of 09/13/2017   No Known Allergies     Medication List    STOP taking these medications   acetaminophen 325 MG tablet Commonly known as:  TYLENOL Replaced by:  acetaminophen 160 MG/5ML solution   cyclobenzaprine 10 MG tablet Commonly known as:  FLEXERIL   gabapentin 300 MG capsule Commonly known as:  NEURONTIN   insulin aspart protamine- aspart (70-30) 100 UNIT/ML injection Commonly known as:  NOVOLOG MIX 70/30   levETIRAcetam 500 MG tablet Commonly known as:  KEPPRA Replaced by:  levETIRAcetam 100 MG/ML  solution   metoprolol tartrate 25 MG tablet Commonly known as:  LOPRESSOR   oxybutynin 5 MG 24 hr tablet Commonly known as:  DITROPAN-XL   pantoprazole 40 MG tablet Commonly known as:  PROTONIX Replaced by:  pantoprazole sodium 40 mg/20 mL Pack   PROAIR HFA 108 (90 Base) MCG/ACT inhaler Generic drug:  albuterol Replaced by:  albuterol (2.5 MG/3ML) 0.083% nebulizer solution     TAKE these medications   acetaminophen 160 MG/5ML solution Commonly known as:  TYLENOL Place 10.2 mLs (325 mg total) into feeding tube every 6 (six) hours as needed for mild pain, headache or fever. Replaces:  acetaminophen 325 MG tablet   albuterol (2.5 MG/3ML) 0.083% nebulizer solution Commonly known as:  PROVENTIL Take 3 mLs (2.5 mg total) by nebulization every 2 (two) hours as needed for wheezing. Replaces:  PROAIR HFA 108 (90 Base) MCG/ACT inhaler   amLODipine 10 MG  tablet Commonly known as:  NORVASC Place 1 tablet (10 mg total) into feeding tube daily. Start taking on:  09/14/2017 What changed:  how to take this   aspirin 81 MG chewable tablet Place 1 tablet (81 mg total) into feeding tube daily. Start taking on:  09/14/2017   atorvastatin 40 MG tablet Commonly known as:  LIPITOR Place 1 tablet (40 mg total) into feeding tube daily at 6 PM. What changed:  how to take this   cloNIDine 0.2 MG tablet Commonly known as:  CATAPRES Place 1 tablet (0.2 mg total) into feeding tube 3 (three) times daily.   collagenase ointment Commonly known as:  SANTYL Apply topically daily. Apply Santyl to right leg wound Q day, then cover with moist gauze and dry gauze and kerlex   docusate 50 MG/5ML liquid Commonly known as:  COLACE Place 5 mLs (50 mg total) into feeding tube 2 (two) times daily as needed for mild constipation.   enalapril 10 MG tablet Commonly known as:  VASOTEC Place 3 tablets (30 mg total) into feeding tube daily. Start taking on:  09/14/2017   enoxaparin 80 MG/0.8ML injection Commonly known as:  LOVENOX Inject 0.8 mLs (80 mg total) into the skin every 12 (twelve) hours.   feeding supplement (JEVITY 1.2 CAL) Liqd Place 1,000 mLs into feeding tube continuous.   feeding supplement (PRO-STAT SUGAR FREE 64) Liqd Place 30 mLs into feeding tube 3 (three) times daily.   folic acid 1 MG tablet Commonly known as:  FOLVITE Take 1 tablet (1 mg total) by mouth daily.   furosemide 40 MG tablet Commonly known as:  LASIX Place 1 tablet (40 mg total) into feeding tube 2 (two) times daily. What changed:    medication strength  how much to take  how to take this  when to take this   hydrALAZINE 100 MG tablet Commonly known as:  APRESOLINE Place 1 tablet (100 mg total) into feeding tube every 8 (eight) hours.   insulin glargine 100 UNIT/ML injection Commonly known as:  LANTUS Inject 0.2 mLs (20 Units total) into the skin 2 (two) times  daily.   labetalol 200 MG tablet Commonly known as:  NORMODYNE Place 2 tablets (400 mg total) into feeding tube 3 (three) times daily.   lacosamide 200 MG Tabs tablet Commonly known as:  VIMPAT Place 1 tablet (200 mg total) into feeding tube 2 (two) times daily.   levETIRAcetam 100 MG/ML solution Commonly known as:  KEPPRA Place 10 mLs (1,000 mg total) into feeding tube 2 (two) times daily. Replaces:  levETIRAcetam 500 MG  tablet   LORazepam 2 MG/ML injection Commonly known as:  ATIVAN Inject 0.5 mLs (1 mg total) into the vein as needed for seizure (Give if seizure lasts longer than 5 minutes).   multivitamin with minerals Tabs tablet Take 1 tablet by mouth daily.   nystatin cream Commonly known as:  MYCOSTATIN Apply topically 2 (two) times daily.   pantoprazole sodium 40 mg/20 mL Pack Commonly known as:  PROTONIX Place 20 mLs (40 mg total) into feeding tube at bedtime. Replaces:  pantoprazole 40 MG tablet   phenytoin 125 MG/5ML suspension Commonly known as:  DILANTIN Place 10 mLs (250 mg total) into feeding tube every 6 (six) hours. Start taking on:  09/14/2017   scopolamine 1 MG/3DAYS Commonly known as:  TRANSDERM-SCOP Place 1 patch (1.5 mg total) onto the skin every 3 (three) days. Start taking on:  09/14/2017   thiamine 100 MG tablet Place 1 tablet (100 mg total) into feeding tube daily. Start taking on:  09/14/2017 What changed:  how to take this   topiramate 200 MG tablet Commonly known as:  TOPAMAX Place 1 tablet (200 mg total) into feeding tube 2 (two) times daily.   warfarin 5 MG tablet Commonly known as:  COUMADIN Take 1 tablet (5 mg total) by mouth daily at 6 PM. Start taking on:  09/14/2017      No Known Allergies Contact information for after-discharge care    [facility] .   Service:  Skilled Nursing Contact information: [place] [phone]               The results of  significant diagnostics from this hospitalization (including imaging, microbiology, ancillary and laboratory) are listed below for reference.    Significant Diagnostic Studies: Ct Abdomen Wo Contrast  Result Date: 08/16/2017 CLINICAL DATA:  Cerebral infarct, respiratory failure and seizure. Evaluation of anatomy prior to possible percutaneous gastrostomy tube placement. EXAM: CT ABDOMEN WITHOUT CONTRAST TECHNIQUE: Multidetector CT imaging of the abdomen was performed following the standard protocol without IV contrast. COMPARISON:  05/10/2017 FINDINGS: Lower chest: Bilateral lower lobe atelectasis/consolidation present. Hepatobiliary: The left lobe of the liver does cross the midline. The gallbladder appears contracted. No gross biliary ductal dilatation. Pancreas: Unenhanced appearance of the pancreas is unremarkable. Spleen: Normal in size. Adrenals/Urinary Tract: No adrenal mass.  No hydronephrosis. Stomach/Bowel: The upper stomach is largely posterior to the left lobe of the liver. The transverse portion of the stomach lies inferior to the liver. This portion of the stomach does lie superior to the transverse colon but in the decompressed state is in very close proximity to the colon. With air insufflation, puncture of the stomach without traversing the colon should be possible under fluoroscopy. There is no evidence ileus or small bowel obstruction. No free air is identified. Vascular/Lymphatic: The abdominal aorta is calcified without evidence of aneurysm. No enlarged lymph nodes are identified in the abdomen. Other: No visualized hernias or ascites. There is some degree of body wall edema/anasarca. No focal fluid collections identified. Musculoskeletal: No acute or significant osseous findings. IMPRESSION: 1. The left lobe of the liver crosses the midline and does lie anterior to the proximal stomach. The transverse portion of the stomach does lie immediately superior to the transverse colon when  decompressed. With air insufflation, the stomach should be able to be approached safely for gastrostomy tube placement. 2. Bilateral lower lobe atelectasis/consolidation 3. Aortic atherosclerosis without aneurysm. 4. Body wall edema/anasarca. Electronically  Signed   By: Aletta Edouard M.D.   On: 08/16/2017 07:52   Dg Abd 1 View  Result Date: 08/15/2017 CLINICAL DATA:  62 y/o  F; nasogastric tube placement. EXAM: ABDOMEN - 1 VIEW COMPARISON:  05/10/2017 CT abdomen and pelvis. FINDINGS: Left hemiabdomen is included within the field of view. Enteric tube tip projects over the gastric body. Normal visible bowel gas pattern. Lumbar spine levocurvature. IMPRESSION: Enteric tube tip projects over gastric body. Electronically Signed   By: Kristine Garbe M.D.   On: 08/15/2017 21:07   Ir Gastrostomy Tube Mod Sed  Result Date: 08/17/2017 INDICATION: 62 year old female with a history of dysphagia EXAM: PERC PLACEMENT GASTROSTOMY MEDICATIONS: 1 g Ancef ANESTHESIA/SEDATION: Versed 0.5 mg IV; Fentanyl 25 mcg IV Moderate Sedation Time:  10 minutes The patient was continuously monitored during the procedure by the interventional radiology nurse under my direct supervision. CONTRAST:  10 cc administered into the gastric lumen. FLUOROSCOPY TIME:  Fluoroscopy Time: 1 minute COMPLICATIONS: None PROCEDURE: Informed written consent was obtained from the patient's family after a thorough discussion of the procedural risks, benefits and alternatives. All questions were addressed. Maximal Sterile Barrier Technique was utilized including caps, mask, sterile gowns, sterile gloves, sterile drape, hand hygiene and skin antiseptic. A timeout was performed prior to the initiation of the procedure. The procedure, risks, benefits, and alternatives were explained to the patient. Questions regarding the procedure were encouraged and answered. The patient understands and consents to the procedure. The epigastrium was prepped with  Betadine in a sterile fashion, and a sterile drape was applied covering the operative field. A sterile gown and sterile gloves were used for the procedure. A 5-French orogastric tube is placed under fluoroscopic guidance. Scout imaging of the abdomen confirms barium within the transverse colon. The stomach was distended with gas. Under fluoroscopic guidance, an 18 gauge needle was utilized to puncture the anterior wall of the body of the stomach. An Amplatz wire was advanced through the needle passing a T fastener into the lumen of the stomach. The T fastener was secured for gastropexy. A 9-French sheath was inserted. A snare was advanced through the 9-French sheath. A Britta Mccreedy was advanced through the orogastric tube. It was snared then pulled out the oral cavity, pulling the snare, as well. The leading edge of the gastrostomy was attached to the snare. It was then pulled down the esophagus and out the percutaneous site. It was secured in place. Contrast was injected. No complication IMPRESSION: Status post fluoroscopic placed percutaneous gastrostomy tube, with 20 [place] pull-through. Signed, Dulcy Fanny. Earleen Newport, DO [facility] Electronically Signed   By: Corrie Mckusick D.O.   On: 08/17/2017 11:46   Dg Chest Port 1 View  Result Date: 09/08/2017 CLINICAL DATA:  Respiratory failure EXAM: PORTABLE CHEST 1 VIEW COMPARISON:  09/03/2017 FINDINGS: Tracheostomy tube in satisfactory position. Hazy bilateral lower lobe airspace disease. No pleural effusion or pneumothorax. Stable cardiomediastinal silhouette. No acute osseous abnormality. Severe osteoarthritis of the right glenohumeral joint. IMPRESSION: Hazy bilateral lower lobe airspace disease concerning for atelectasis versus pneumonia. Electronically Signed   By: Kathreen Devoid   On: 09/08/2017 08:07   Dg Chest Port 1 View  Result Date: 09/03/2017 CLINICAL DATA:  Respiratory failure, tracheostomy patient. History  of CHF, diabetes, hypertension, substance abuse. EXAM: PORTABLE CHEST 1 VIEW COMPARISON:  Portable chest x-ray of August 29, 2017 FINDINGS: The lungs are adequately inflated. The interstitial markings remain increased in the mid and lower lungs. The  cardiac silhouette is mildly enlarged. The pulmonary vascularity is normal. There is no pleural effusion. There is calcification in the wall of the aortic arch. The tracheostomy tube tip projects at the superior margin of the clavicular heads. IMPRESSION: Persistent subsegmental atelectasis bilaterally. No alveolar pneumonia nor pulmonary edema. Electronically Signed   By: David  Martinique M.D.   On: 09/03/2017 07:24   Dg Chest Port 1 View  Result Date: 08/29/2017 CLINICAL DATA:  Respiratory failure EXAM: PORTABLE CHEST 1 VIEW COMPARISON:  08/24/2017 FINDINGS: Cardiac shadow is stable. Tracheostomy tube and right-sided PICC line are noted in satisfactory position. Bibasilar opacities are again seen slightly improved from the prior exam which may be related to better inspiratory effort. No sizable effusion is seen. No bony abnormality is noted. IMPRESSION: Improved bibasilar opacities likely related to an improved inspiratory effort. Electronically Signed   By: Inez Catalina M.D.   On: 08/29/2017 08:08   Dg Chest Port 1 View  Result Date: 08/24/2017 CLINICAL DATA:  Pneumonia. EXAM: PORTABLE CHEST 1 VIEW COMPARISON:  08/16/2017, 08/15/2017 and 08/02/2017 FINDINGS: Tracheostomy tube is unchanged. NG tube has removed. PICC unchanged with the tip in the right atrium. This could be retracted approximately 2-3 cm. The bilateral infiltrates appear slightly progressed, now extending into the perihilar regions. No discrete effusions. Heart size and vascularity remain within normal limits. No acute bone abnormality. IMPRESSION: 1. Progression of bilateral pulmonary infiltrates. 2. PICC tip is in the right atrium and could be retracted 2-3 cm. Electronically Signed   By: Lorriane Shire M.D.   On: 08/24/2017 12:09   Dg Chest Port 1 View  Result Date: 08/16/2017 CLINICAL DATA:  Tracheostomy EXAM: PORTABLE CHEST 1 VIEW COMPARISON:  08/15/2017 FINDINGS: Tracheostomy remains in good position and unchanged. Central venous catheter tip in the lower SVC also unchanged. No pneumothorax. Interval placement of NG tube with the tip not visualized. NG tube passes through the stomach. Bibasilar airspace disease similar to the prior study.  No effusion. IMPRESSION: Interval placement of NG tube.  Tracheostomy unchanged in position. Bibasilar airspace disease similar to yesterday. Electronically Signed   By: Franchot Gallo M.D.   On: 08/16/2017 07:04   Dg Chest Port 1 View  Result Date: 08/15/2017 CLINICAL DATA:  Acute respiratory failure EXAM: PORTABLE CHEST 1 VIEW COMPARISON:  08/15/2017 FINDINGS: Interval placement tracheostomy tube which projects over the mid trachea. No pneumothorax. Left central line is unchanged. Interval placement of right PICC line with the tip in the SVC. Interval removal of the NG tube. Bibasilar airspace opacities are again noted. No visible effusions. Heart is mildly enlarged. IMPRESSION: Interval tracheostomy placement.  No pneumothorax. Right PICC line tip in the SVC. Stable bibasilar atelectasis or infiltrates. Electronically Signed   By: Rolm Baptise M.D.   On: 08/15/2017 15:38   Dg Chest Port 1 View  Result Date: 08/15/2017 CLINICAL DATA:  Acute respiratory failure with hypoxemia. EXAM: PORTABLE CHEST 1 VIEW COMPARISON:  08/14/2017 FINDINGS: Endotracheal tube terminates just below the clavicular heads, well above the carina. Enteric tube courses into the upper abdomen with side hole well beyond the GE junction and tip not imaged. Left jugular catheter terminates over the lower SVC. The cardiac silhouette appears mildly enlarged. Bibasilar airspace opacities are stable to slightly increased on the right and unchanged on the left. No sizable pleural effusion or  pneumothorax is identified. IMPRESSION: Bibasilar infiltrates, slightly increased on the right. Electronically Signed   By: Logan Bores M.D.   On: 08/15/2017 09:21  Dg Abd Portable 1v  Result Date: 08/17/2017 CLINICAL DATA:  Feeding tube placement EXAM: PORTABLE ABDOMEN - 1 VIEW COMPARISON:  08/17/2017, 08/15/2017 FINDINGS: Esophageal tube tip projects over the mid stomach, side-port projects over the gastric fundus. Contrast material within the bowel. Gastrostomy balloon visualized in the mid abdominal region, projecting over distal gastric region. Pelvic/rectal probe noted IMPRESSION: 1. Gastrostomy balloon projects over anticipated location of distal stomach; there is residual contrast in the bowel 2. Esophageal tube tip and side-port project over the proximal to mid stomach. Electronically Signed   By: Donavan Foil M.D.   On: 08/17/2017 18:18    Microbiology: No results found for this or any previous visit (from the past 240 hour(s)).   Labs: Basic Metabolic Panel: Recent Labs  Lab 09/08/17 0514 09/09/17 0515 09/10/17 0354 09/11/17 0341 09/12/17 0336 09/13/17 0551  NA 136 133* 136 136 134* 136  K 5.2* 4.2 3.9 3.9 3.8 3.8  CL 104 102 103 102 100* 102  CO2 20* 17* 20* 20* 22 21*  GLUCOSE 134* 179* 67 141* 224* 176*  BUN 52* 52* 56* 59* 69* 80*  CREATININE 1.17* 1.27* 1.21* 1.30* 1.49* 1.42*  CALCIUM 9.4 9.1 8.9 9.3 9.1 9.1  MG 2.0  --   --   --   --  1.8   Liver Function Tests: No results for input(s): AST, ALT, ALKPHOS, BILITOT, PROT, ALBUMIN in the last 168 hours. No results for input(s): LIPASE, AMYLASE in the last 168 hours. No results for input(s): AMMONIA in the last 168 hours. CBC: Recent Labs  Lab 09/08/17 0514 09/09/17 0515 09/10/17 0354 09/12/17 0336 09/13/17 0551  WBC 11.8* 12.4* 10.5 9.9 9.9  HGB 11.0* 11.0* 10.2* 9.1* 9.3*  HCT 33.8* 34.0* 31.5* 28.2* 28.9*  MCV 94.7 94.7 94.6 93.1 92.3  PLT 408* 411* 333 414* 428*   Cardiac Enzymes: No results for  input(s): CKTOTAL, CKMB, CKMBINDEX, TROPONINI in the last 168 hours. BNP: BNP (last 3 results) Recent Labs    05/25/17 1955  BNP 19.8    ProBNP (last 3 results) No results for input(s): PROBNP in the last 8760 hours.  CBG: Recent Labs  Lab 09/12/17 1926 09/12/17 2324 09/13/17 0338 09/13/17 0756 09/13/17 1219  GLUCAP 142* 192* 172* 177* 183*       Signed:  Dia Crawford, MD [facility] [phone] pager

## 2017-09-13 NOTE — Progress Notes (Signed)
Touched based with [facility] this am- they are awaiting determination from DON regarding daily INR checks and adjusting coumadin- will inform CSW once determination is made  [name], [place] Social Worker [phone]

## 2017-09-13 NOTE — Progress Notes (Addendum)
ANTICOAGULATION CONSULT NOTE  Pharmacy Consult:  Heparin Indication: DVT  No Known Allergies  Patient Measurements: Height: 5\' 5"  (165.1 cm) Weight: 181 lb 14.1 oz (82.5 kg) IBW/kg (Calculated) : 57 Heparin Dosing Weight: 76 kg  Vital Signs: Temp: 98.7 F (37.1 C) (03/07 0758) Temp Source: Oral (03/07 0758) BP: 151/75 (03/07 1000) Pulse Rate: 96 (03/07 0913)  Labs: Recent Labs    09/11/17 0341  09/11/17 1634 09/12/17 0336 09/12/17 1105 09/13/17 0551  HGB  --   --   --  9.1*  --  9.3*  HCT  --   --   --  28.2*  --  28.9*  PLT  --   --   --  414*  --  428*  APTT  --    < > 36 47* 85* 59*  LABPROT  --   --   --  14.9  --  14.7  INR  --   --   --  1.18  --  1.16  HEPARINUNFRC  --   --  0.24* 0.35  --  0.37  CREATININE 1.30*  --   --  1.49*  --  1.42*   < > = values in this interval not displayed.    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 She was discovered to have RUE DVT. Pharmacy consulted to manage heparin bridge to Coumadin.  INR is sub-therapeutic as expected.  Heparin level is therapeutic but aPTT is low; no bleeding reported.  No issue with heparin infusion per RN.   Goal of Therapy:  Heparin level 0.3 - 0.5 units/hr APTT 66-85 sec Monitor platelets by anticoagulation protocol: Yes    Plan:  Increase heparin gtt to 1650 units/hr Check 6 hr aPTT Repeat Coumadin 7.5mg  PO today Daily heparin level, CBC, PT / INR   Todrick Siedschlag D. Mina Marble, PharmD, BCPS Pager:  [phone] 09/13/2017, 11:04 AM

## 2017-09-13 NOTE — Discharge Instructions (Signed)

## 2017-09-13 NOTE — Progress Notes (Signed)
CSW attempted to call pt dtr multiple times to inform of DC- dtr eventually answered but stated she was very sick and would not be able to make it out of home to get paperwork that was to be faxed to her.  Dtr attempted to call local contact but had to leave message.  [facility] decided they would be unwilling to accept patient after 5pm so will have to make plan for paperwork tomorrow- CSW supervisor willing to sign patient in temporarily until dtr is well enough to complete paperwork herself  [name], [place] Social Worker [phone]

## 2017-09-14 LAB — GLUCOSE, CAPILLARY
Glucose-Capillary: 191 mg/dL — ABNORMAL HIGH (ref 65–99)
Glucose-Capillary: 192 mg/dL — ABNORMAL HIGH (ref 65–99)
Glucose-Capillary: 184 mg/dL — ABNORMAL HIGH (ref 65–99)
Glucose-Capillary: 110 mg/dL — ABNORMAL HIGH (ref 65–99)

## 2017-09-14 LAB — ALBUMIN: Albumin: 2.3 g/dL — ABNORMAL LOW (ref 3.5–5.0)

## 2017-09-14 LAB — PHENYTOIN LEVEL, TOTAL: PHENYTOIN LVL: 32.2 ug/mL — AB (ref 10.0–20.0)

## 2017-09-14 LAB — BASIC METABOLIC PANEL
ANION GAP: 15 (ref 5–15)
BUN: 89 mg/dL — ABNORMAL HIGH (ref 6–20)
CALCIUM: 9.3 mg/dL (ref 8.9–10.3)
CHLORIDE: 104 mmol/L (ref 101–111)
CO2: 19 mmol/L — AB (ref 22–32)
Creatinine, Ser: 1.52 mg/dL — ABNORMAL HIGH (ref 0.44–1.00)
GFR calc non Af Amer: 36 mL/min — ABNORMAL LOW (ref >60)
GFR, EST AFRICAN AMERICAN: 42 mL/min — AB (ref >60)
Glucose, Bld: 190 mg/dL — ABNORMAL HIGH (ref 65–99)
POTASSIUM: 4 mmol/L (ref 3.5–5.1)
Sodium: 138 mmol/L (ref 135–145)

## 2017-09-14 LAB — PROTIME-INR
INR: 1.65
PROTHROMBIN TIME: 19.4 s — AB (ref 11.4–15.2)

## 2017-09-14 LAB — MAGNESIUM: MAGNESIUM: 2 mg/dL (ref 1.7–2.4)

## 2017-09-14 NOTE — Progress Notes (Signed)
ANTICOAGULATION CONSULT NOTE  Pharmacy Consult:  Coumadin Indication: DVT  No Known Allergies  Patient Measurements: Height: 5\' 5"  (165.1 cm) Weight: 180 lb 16 oz (82.1 kg) IBW/kg (Calculated) : 57  Vital Signs: Temp: 98.2 F (36.8 C) (03/08 1114) Temp Source: Oral (03/08 1114) BP: 147/77 (03/08 1300) Pulse Rate: 92 (03/08 1055)  Labs: Recent Labs    09/11/17 1634 09/12/17 0336 09/12/17 1105 09/13/17 0551 09/14/17 0421  HGB  --  9.1*  --  9.3*  --   HCT  --  28.2*  --  28.9*  --   PLT  --  414*  --  428*  --   APTT 36 47* 85* 59*  --   LABPROT  --  14.9  --  14.7 19.4*  INR  --  1.18  --  1.16 1.65  HEPARINUNFRC 0.24* 0.35  --  0.37  --   CREATININE  --  1.49*  --  1.42* 1.52*    Assessment: 25 YOF with 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 She was discovered to have RUE DVT. Pharmacy consulted to manage Coumadin.  She is also on Lovenox bridge.  INR is trending up but remains sub-therapeutic.  SCr is rising steadily but CrCL remains appropriate for BID dosing of Lovenox.  No bleeding reported.   Goal of Therapy:  INR 2-3 Anti-Xa level 06-1.2 units/mL Monitor platelets by anticoagulation protocol: Yes    Plan:  Lovenox 80mg  SQ Q12H until INR therapeutic x 2 Coumadin 5mg  PO today (Coumadin 5mg  PO daily at 1800 already ordered by MD) Daily PT / INR CBC Q72H   Shelie Lansing D. Mina Marble, PharmD, BCPS Pager:  [phone] 09/14/2017, 2:28 PM

## 2017-09-14 NOTE — Progress Notes (Signed)
Patient will discharge to [facility] Anticipated discharge date: 3/8 Family notified: pt dtr Transportation by [facility] called at [place] signing off.  [name], LCSW Clinical Social Worker [phone]

## 2017-09-14 NOTE — Progress Notes (Signed)
Report given to [facility] at [facility]. Pt being transported by [facility], vital signs stable upon discharge.

## 2017-09-14 NOTE — Discharge Summary (Signed)
Physician Discharge Summary  [name] IOX:[id number] DOB: 1956/04/26 DOA: 08/02/2017  PCP: Nolene Ebbs, MD  Admit date: 08/02/2017 Discharge date: 09/14/2017  Time spent: 35 minutes  Active Issues at time of D/C:  Polysubstance abuse    CVA/Watershed infarct/Acute encephalopath -Patient now obtunded secondary to polysubstance abuse, noncompliance---> refractory seizures and CVA. -Extremely poor prognosis secondary significant brain injury.   Status epilepticus  -Last recorded seizure, 2/24 . -Lacosamide 200 mg bid -Keppra 1000 mg bid -Phenytoin was being dosed at 250 mg QID. NOTE: Has been held secondary to elevated Dilantin level. On 3/6 phenytoin level= 34.3 g/ml. On 3/8 32.2 which CORRECTS TO 57.5 -Obtain follow up phenytoin levels and restart when phenytoin level< 20 g/ml - with suggestion to resume at 250mg  BID    Positive pseudomonas pneumonia -Completed course of treatment    Acute respiratory failure with hypoxemia -3/1 #6 cuffed--> #6 cuffless: Patient is never to be decannualted or downsized beyond this given neuro status    Chronic diastolic CHF -Per neurology target SBP<140 -Amlodipine 10 mg daily -Clonidine 0.2 mg TID -Enalapril 30 mg daily  -Lasix 40 mg BID -Hydralazine 100mg  TID -Labetalol 400 mg TID   Essential HTN  -see CHF    Hypernatremia  -Free water 100 ml TID    Hypokalemia -Potassium goal> 4   Hypomagnesemia -Magnesium goal> 2   Anemia,     Acute RIGHT upper extremity DVT -Bilateral upper extremity swelling RIGHT>> LEFT. RIGHT upper extremity has PICC line. Will obtain bilateral upper extremity Doppler rule out DVT. -Lovenox 80 mg BID, until therapeutic on Coumadin -Coumadin 5 mg daily. Check daily INR. And adjust for INR goal of 2-3   Diabetes type 2 uncontrolled with complication -26/83/4196 Hemoglobin A1c= 14.6 -Lantus 20 units BID     Discharge Diagnoses:  Active Problems:   Uncontrolled hypertension    Cerebrovascular accident (CVA) ([place])   Benign essential HTN   Status epilepticus (HCC)   Altered mental status   Cocaine abuse (HCC)   Pressure injury of skin   Acute respiratory failure with hypoxemia (HCC)   Metabolic acidosis   Tracheostomy status (HCC)   Chronic diastolic CHF (congestive heart failure) (HCC)   Acute deep vein thrombosis (DVT) of right upper extremity ([place])   Acute bilat watershed infarction   Pneumonia of both lower lobes due to Pseudomonas species ([place])   Acute respiratory failure with hypoxia (HCC)   Essential hypertension   Diabetes mellitus type 2, uncontrolled, with complications ([place])   Discharge Condition: Guarded   Diet recommendation: Tube feed  Filed Weights   09/12/17 0440 09/13/17 0500 09/14/17 0500  Weight: 82.2 kg (181 lb 3.5 oz) 82.5 kg (181 lb 14.1 oz) 82.1 kg (180 lb 16 oz)    History of present illness:  62 year old BF PMHx polysubstance abuse (EtOH, Cocaine), Diabetes mellitus type II uncontrolled with complication, HTN, Insomnia disorder, Shortness of breath.   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  She was found down today in a puddle of stool and urine.  She brought to our department emergency medicine where she had overt clinical seizure activity.  She was treated with Keppra fosphenytoin and Ativan in the clinical seizures have subsided.  EEG does not show ongoing seizure activity but shows decreased activity throughout the left hemisphere suggesting a postictal state.  She is unable to give any history and remains obtunded at this time.  Imaging studies did not show a new structural lesion.  She was  coughing up purulent material in the department of emergency medicine.  His hospitalization diagnosed with watershed infarct secondary to EtOH, cocaine abuse. In addition patient was treated for Pseudomonas pneumonia. Hospitalization complicated requirement for intubation and eventually  need for tracheostomy. Patient now tracheostomy dependent. Prognosis poor. Stable for discharge.   Procedures: 1/24 EEG:-Abnormal , evidence of moderate generalized slowing of brain activity as well as severe focal slowing in the left temporal lobe.  The patient is not in non-convulsive status epilepticus. 2D echo 1/25>>> EF 69-67%, grade 2 diastolic dysfunction  8/93 MRI brain  No acute intracranial abnormality. 2. 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 3. Remote hemorrhage in the left thalamus and left pons. 4. Age advanced atrophy. 5. Fluid in the nasopharynx and paranasal sinuses is likely secondary to intubation. 2/21 R UE duplex:- Positive for acute DVT subclavian vein, axillary vein and proximal brachial veins.     Consultations: Stroke team PC CM   Cultures   1/30 Blood Cx negative 1/30 Respiratory positive Psuedomonas Aeruginosa   Antibiotics Anti-infectives (From admission, onward)   Start     Stop   08/26/17 1615  ciprofloxacin (CIPRO) IVPB 400 mg  Status:  Discontinued     09/03/17 1515   08/17/17 1145  ceFAZolin (ANCEF) IVPB 1 g/50 mL premix     08/17/17 1144   08/17/17 1120  ceFAZolin (ANCEF) powder 1 g  Status:  Discontinued     08/17/17 1141   08/17/17 1119  ceFAZolin (ANCEF) IVPB 2g/100 mL premix  Status:  Discontinued     08/17/17 1132   08/17/17 1057  ceFAZolin (ANCEF) 2-4 GM/100ML-% IVPB    Comments:  Camelia Phenes   : cabinet override   08/17/17 1131   08/17/17 0600  ceFAZolin (ANCEF) IVPB 2g/100 mL premix     08/17/17 0553   08/16/17 1530  ceFAZolin (ANCEF) IVPB 2g/100 mL premix  Status:  Discontinued     08/16/17 1526   08/08/17 2300  vancomycin (VANCOCIN) IVPB 750 mg/150 ml premix  Status:  Discontinued     08/10/17 0923   08/08/17 1400  piperacillin-tazobactam (ZOSYN) IVPB 3.375 g  Status:  Discontinued     08/14/17 0745   08/08/17 1100  vancomycin (VANCOCIN) 2,000 mg in sodium chloride 0.9 % 500 mL IVPB     08/08/17 1253       Lines/Tubes CVC 1/24>>>2/6 ETT 1/24>>>2/6 NG/OG 1/24>>> Tracheostomy 2/6>> PICC 2/6 >> #6 cuffless 3/1>>        Discharge Exam: Vitals:   09/14/17 1200 09/14/17 1300 09/14/17 1400 09/14/17 1500  BP: 124/62 (!) 147/77 (!) 151/71 118/79  Pulse:      Resp: (!) 24 (!) 28 (!) 36 (!) 25  Temp:      TempSrc:      SpO2: 97% 97% 95% 98%  Weight:      Height:        General: Obtunded, positive acute respiratory distress Neck:  Negative scars, masses, torticollis, lymphadenopathy, JVD, #6 cuffless trach in place covered and clean negative sign of infection. Lungs: Clear to auscultation bilaterally without wheezes or crackles Cardiovascular: Regular rate and rhythm without murmur gallop or rub normal S1 and S2   Allergies as of 09/14/2017   No Known Allergies     Medication List    STOP taking these medications   acetaminophen 325 MG tablet Commonly known as:  TYLENOL Replaced by:  acetaminophen 160 MG/5ML solution   cyclobenzaprine 10 MG tablet Commonly known as:  FLEXERIL  gabapentin 300 MG capsule Commonly known as:  NEURONTIN   insulin aspart protamine- aspart (70-30) 100 UNIT/ML injection Commonly known as:  NOVOLOG MIX 70/30   levETIRAcetam 500 MG tablet Commonly known as:  KEPPRA Replaced by:  levETIRAcetam 100 MG/ML solution   metoprolol tartrate 25 MG tablet Commonly known as:  LOPRESSOR   oxybutynin 5 MG 24 hr tablet Commonly known as:  DITROPAN-XL   pantoprazole 40 MG tablet Commonly known as:  PROTONIX Replaced by:  pantoprazole sodium 40 mg/20 mL Pack   PROAIR HFA 108 (90 Base) MCG/ACT inhaler Generic drug:  albuterol Replaced by:  albuterol (2.5 MG/3ML) 0.083% nebulizer solution     TAKE these medications   acetaminophen 160 MG/5ML solution Commonly known as:  TYLENOL Place 10.2 mLs (325 mg total) into feeding tube every 6 (six) hours as needed for mild pain, headache or fever. Replaces:  acetaminophen 325 MG tablet   albuterol (2.5  MG/3ML) 0.083% nebulizer solution Commonly known as:  PROVENTIL Take 3 mLs (2.5 mg total) by nebulization every 2 (two) hours as needed for wheezing. Replaces:  PROAIR HFA 108 (90 Base) MCG/ACT inhaler   amLODipine 10 MG tablet Commonly known as:  NORVASC Place 1 tablet (10 mg total) into feeding tube daily. What changed:  how to take this   aspirin 81 MG chewable tablet Place 1 tablet (81 mg total) into feeding tube daily.   atorvastatin 40 MG tablet Commonly known as:  LIPITOR Place 1 tablet (40 mg total) into feeding tube daily at 6 PM. What changed:  how to take this   cloNIDine 0.2 MG tablet Commonly known as:  CATAPRES Place 1 tablet (0.2 mg total) into feeding tube 3 (three) times daily.   collagenase ointment Commonly known as:  SANTYL Apply topically daily. Apply Santyl to right leg wound Q day, then cover with moist gauze and dry gauze and kerlex   docusate 50 MG/5ML liquid Commonly known as:  COLACE Place 5 mLs (50 mg total) into feeding tube 2 (two) times daily as needed for mild constipation.   enalapril 10 MG tablet Commonly known as:  VASOTEC Place 3 tablets (30 mg total) into feeding tube daily.   enoxaparin 80 MG/0.8ML injection Commonly known as:  LOVENOX Inject 0.8 mLs (80 mg total) into the skin every 12 (twelve) hours.   feeding supplement (JEVITY 1.2 CAL) Liqd Place 1,000 mLs into feeding tube continuous.   feeding supplement (PRO-STAT SUGAR FREE 64) Liqd Place 30 mLs into feeding tube 3 (three) times daily.   folic acid 1 MG tablet Commonly known as:  FOLVITE Take 1 tablet (1 mg total) by mouth daily.   furosemide 40 MG tablet Commonly known as:  LASIX Place 1 tablet (40 mg total) into feeding tube 2 (two) times daily. What changed:    medication strength  how much to take  how to take this  when to take this   hydrALAZINE 100 MG tablet Commonly known as:  APRESOLINE Place 1 tablet (100 mg total) into feeding tube every 8 (eight)  hours.   insulin glargine 100 UNIT/ML injection Commonly known as:  LANTUS Inject 0.2 mLs (20 Units total) into the skin 2 (two) times daily.   labetalol 200 MG tablet Commonly known as:  NORMODYNE Place 2 tablets (400 mg total) into feeding tube 3 (three) times daily.   lacosamide 200 MG Tabs tablet Commonly known as:  VIMPAT Place 1 tablet (200 mg total) into feeding tube 2 (two) times daily.  levETIRAcetam 100 MG/ML solution Commonly known as:  KEPPRA Place 10 mLs (1,000 mg total) into feeding tube 2 (two) times daily. Replaces:  levETIRAcetam 500 MG tablet   LORazepam 2 MG/ML injection Commonly known as:  ATIVAN Inject 0.5 mLs (1 mg total) into the vein as needed for seizure (Give if seizure lasts longer than 5 minutes).   multivitamin with minerals Tabs tablet Take 1 tablet by mouth daily.   nystatin cream Commonly known as:  MYCOSTATIN Apply topically 2 (two) times daily.   pantoprazole sodium 40 mg/20 mL Pack Commonly known as:  PROTONIX Place 20 mLs (40 mg total) into feeding tube at bedtime. Replaces:  pantoprazole 40 MG tablet   scopolamine 1 MG/3DAYS Commonly known as:  TRANSDERM-SCOP Place 1 patch (1.5 mg total) onto the skin every 3 (three) days.   thiamine 100 MG tablet Place 1 tablet (100 mg total) into feeding tube daily. What changed:  how to take this   topiramate 200 MG tablet Commonly known as:  TOPAMAX Place 1 tablet (200 mg total) into feeding tube 2 (two) times daily.   warfarin 5 MG tablet Commonly known as:  COUMADIN Take 1 tablet (5 mg total) by mouth daily at 6 PM.      No Known Allergies Contact information for after-discharge care    [facility] .   Service:  Skilled Nursing Contact information: [place] [phone]               The results of significant diagnostics from this hospitalization (including imaging, microbiology, ancillary and  laboratory) are listed below for reference.    Significant Diagnostic Studies: Ct Abdomen Wo Contrast  Result Date: 08/16/2017 CLINICAL DATA:  Cerebral infarct, respiratory failure and seizure. Evaluation of anatomy prior to possible percutaneous gastrostomy tube placement. EXAM: CT ABDOMEN WITHOUT CONTRAST TECHNIQUE: Multidetector CT imaging of the abdomen was performed following the standard protocol without IV contrast. COMPARISON:  05/10/2017 FINDINGS: Lower chest: Bilateral lower lobe atelectasis/consolidation present. Hepatobiliary: The left lobe of the liver does cross the midline. The gallbladder appears contracted. No gross biliary ductal dilatation. Pancreas: Unenhanced appearance of the pancreas is unremarkable. Spleen: Normal in size. Adrenals/Urinary Tract: No adrenal mass.  No hydronephrosis. Stomach/Bowel: The upper stomach is largely posterior to the left lobe of the liver. The transverse portion of the stomach lies inferior to the liver. This portion of the stomach does lie superior to the transverse colon but in the decompressed state is in very close proximity to the colon. With air insufflation, puncture of the stomach without traversing the colon should be possible under fluoroscopy. There is no evidence ileus or small bowel obstruction. No free air is identified. Vascular/Lymphatic: The abdominal aorta is calcified without evidence of aneurysm. No enlarged lymph nodes are identified in the abdomen. Other: No visualized hernias or ascites. There is some degree of body wall edema/anasarca. No focal fluid collections identified. Musculoskeletal: No acute or significant osseous findings. IMPRESSION: 1. The left lobe of the liver crosses the midline and does lie anterior to the proximal stomach. The transverse portion of the stomach does lie immediately superior to the transverse colon when decompressed. With air insufflation, the stomach should be able to be approached safely for gastrostomy  tube placement. 2. Bilateral lower lobe atelectasis/consolidation 3. Aortic atherosclerosis without aneurysm. 4. Body wall edema/anasarca. Electronically Signed   By: Aletta Edouard M.D.   On: 08/16/2017 07:52   Dg Abd  1 View  Result Date: 08/15/2017 CLINICAL DATA:  62 y/o  F; nasogastric tube placement. EXAM: ABDOMEN - 1 VIEW COMPARISON:  05/10/2017 CT abdomen and pelvis. FINDINGS: Left hemiabdomen is included within the field of view. Enteric tube tip projects over the gastric body. Normal visible bowel gas pattern. Lumbar spine levocurvature. IMPRESSION: Enteric tube tip projects over gastric body. Electronically Signed   By: Kristine Garbe M.D.   On: 08/15/2017 21:07   Ir Gastrostomy Tube Mod Sed  Result Date: 08/17/2017 INDICATION: 62 year old female with a history of dysphagia EXAM: PERC PLACEMENT GASTROSTOMY MEDICATIONS: 1 g Ancef ANESTHESIA/SEDATION: Versed 0.5 mg IV; Fentanyl 25 mcg IV Moderate Sedation Time:  10 minutes The patient was continuously monitored during the procedure by the interventional radiology nurse under my direct supervision. CONTRAST:  10 cc administered into the gastric lumen. FLUOROSCOPY TIME:  Fluoroscopy Time: 1 minute COMPLICATIONS: None PROCEDURE: Informed written consent was obtained from the patient's family after a thorough discussion of the procedural risks, benefits and alternatives. All questions were addressed. Maximal Sterile Barrier Technique was utilized including caps, mask, sterile gowns, sterile gloves, sterile drape, hand hygiene and skin antiseptic. A timeout was performed prior to the initiation of the procedure. The procedure, risks, benefits, and alternatives were explained to the patient. Questions regarding the procedure were encouraged and answered. The patient understands and consents to the procedure. The epigastrium was prepped with Betadine in a sterile fashion, and a sterile drape was applied covering the operative field. A sterile gown  and sterile gloves were used for the procedure. A 5-French orogastric tube is placed under fluoroscopic guidance. Scout imaging of the abdomen confirms barium within the transverse colon. The stomach was distended with gas. Under fluoroscopic guidance, an 18 gauge needle was utilized to puncture the anterior wall of the body of the stomach. An Amplatz wire was advanced through the needle passing a T fastener into the lumen of the stomach. The T fastener was secured for gastropexy. A 9-French sheath was inserted. A snare was advanced through the 9-French sheath. A Britta Mccreedy was advanced through the orogastric tube. It was snared then pulled out the oral cavity, pulling the snare, as well. The leading edge of the gastrostomy was attached to the snare. It was then pulled down the esophagus and out the percutaneous site. It was secured in place. Contrast was injected. No complication IMPRESSION: Status post fluoroscopic placed percutaneous gastrostomy tube, with 20 [place] pull-through. Signed, Dulcy Fanny. Earleen Newport, DO [facility] Radiology Electronically Signed   By: Corrie Mckusick D.O.   On: 08/17/2017 11:46   Dg Chest Port 1 View  Result Date: 09/08/2017 CLINICAL DATA:  Respiratory failure EXAM: PORTABLE CHEST 1 VIEW COMPARISON:  09/03/2017 FINDINGS: Tracheostomy tube in satisfactory position. Hazy bilateral lower lobe airspace disease. No pleural effusion or pneumothorax. Stable cardiomediastinal silhouette. No acute osseous abnormality. Severe osteoarthritis of the right glenohumeral joint. IMPRESSION: Hazy bilateral lower lobe airspace disease concerning for atelectasis versus pneumonia. Electronically Signed   By: Kathreen Devoid   On: 09/08/2017 08:07   Dg Chest Port 1 View  Result Date: 09/03/2017 CLINICAL DATA:  Respiratory failure, tracheostomy patient. History of CHF, diabetes, hypertension, substance abuse. EXAM: PORTABLE CHEST 1 VIEW COMPARISON:  Portable chest  x-ray of August 29, 2017 FINDINGS: The lungs are adequately inflated. The interstitial markings remain increased in the mid and lower lungs. The cardiac silhouette is mildly enlarged. The pulmonary vascularity is normal. There is no pleural effusion. There is  calcification in the wall of the aortic arch. The tracheostomy tube tip projects at the superior margin of the clavicular heads. IMPRESSION: Persistent subsegmental atelectasis bilaterally. No alveolar pneumonia nor pulmonary edema. Electronically Signed   By: David  Martinique M.D.   On: 09/03/2017 07:24   Dg Chest Port 1 View  Result Date: 08/29/2017 CLINICAL DATA:  Respiratory failure EXAM: PORTABLE CHEST 1 VIEW COMPARISON:  08/24/2017 FINDINGS: Cardiac shadow is stable. Tracheostomy tube and right-sided PICC line are noted in satisfactory position. Bibasilar opacities are again seen slightly improved from the prior exam which may be related to better inspiratory effort. No sizable effusion is seen. No bony abnormality is noted. IMPRESSION: Improved bibasilar opacities likely related to an improved inspiratory effort. Electronically Signed   By: Inez Catalina M.D.   On: 08/29/2017 08:08   Dg Chest Port 1 View  Result Date: 08/24/2017 CLINICAL DATA:  Pneumonia. EXAM: PORTABLE CHEST 1 VIEW COMPARISON:  08/16/2017, 08/15/2017 and 08/02/2017 FINDINGS: Tracheostomy tube is unchanged. NG tube has removed. PICC unchanged with the tip in the right atrium. This could be retracted approximately 2-3 cm. The bilateral infiltrates appear slightly progressed, now extending into the perihilar regions. No discrete effusions. Heart size and vascularity remain within normal limits. No acute bone abnormality. IMPRESSION: 1. Progression of bilateral pulmonary infiltrates. 2. PICC tip is in the right atrium and could be retracted 2-3 cm. Electronically Signed   By: Lorriane Shire M.D.   On: 08/24/2017 12:09   Dg Chest Port 1 View  Result Date: 08/16/2017 CLINICAL DATA:   Tracheostomy EXAM: PORTABLE CHEST 1 VIEW COMPARISON:  08/15/2017 FINDINGS: Tracheostomy remains in good position and unchanged. Central venous catheter tip in the lower SVC also unchanged. No pneumothorax. Interval placement of NG tube with the tip not visualized. NG tube passes through the stomach. Bibasilar airspace disease similar to the prior study.  No effusion. IMPRESSION: Interval placement of NG tube.  Tracheostomy unchanged in position. Bibasilar airspace disease similar to yesterday. Electronically Signed   By: Franchot Gallo M.D.   On: 08/16/2017 07:04   Dg Abd Portable 1v  Result Date: 08/17/2017 CLINICAL DATA:  Feeding tube placement EXAM: PORTABLE ABDOMEN - 1 VIEW COMPARISON:  08/17/2017, 08/15/2017 FINDINGS: Esophageal tube tip projects over the mid stomach, side-port projects over the gastric fundus. Contrast material within the bowel. Gastrostomy balloon visualized in the mid abdominal region, projecting over distal gastric region. Pelvic/rectal probe noted IMPRESSION: 1. Gastrostomy balloon projects over anticipated location of distal stomach; there is residual contrast in the bowel 2. Esophageal tube tip and side-port project over the proximal to mid stomach. Electronically Signed   By: Donavan Foil M.D.   On: 08/17/2017 18:18     Labs: Basic Metabolic Panel: Recent Labs  Lab 09/08/17 0514  09/10/17 0354 09/11/17 0341 09/12/17 0336 09/13/17 0551 09/14/17 0421  NA 136   < > 136 136 134* 136 138  K 5.2*   < > 3.9 3.9 3.8 3.8 4.0  CL 104   < > 103 102 100* 102 104  CO2 20*   < > 20* 20* 22 21* 19*  GLUCOSE 134*   < > 67 141* 224* 176* 190*  BUN 52*   < > 56* 59* 69* 80* 89*  CREATININE 1.17*   < > 1.21* 1.30* 1.49* 1.42* 1.52*  CALCIUM 9.4   < > 8.9 9.3 9.1 9.1 9.3  MG 2.0  --   --   --   --  1.8 2.0   < > =  values in this interval not displayed.   Liver Function Tests: Recent Labs  Lab 09/14/17 0421  ALBUMIN 2.3*   CBC: Recent Labs  Lab 09/08/17 0514  09/09/17 0515 09/10/17 0354 09/12/17 0336 09/13/17 0551  WBC 11.8* 12.4* 10.5 9.9 9.9  HGB 11.0* 11.0* 10.2* 9.1* 9.3*  HCT 33.8* 34.0* 31.5* 28.2* 28.9*  MCV 94.7 94.7 94.6 93.1 92.3  PLT 408* 411* 333 414* 428*   CBG: Recent Labs  Lab 09/13/17 2323 09/14/17 0401 09/14/17 0756 09/14/17 1106 09/14/17 1506  GLUCAP 200* 191* 192* 184* 110*    Signed:  Cherene Altes, MD [facility] 09/14/2017, 3:48 PM   Note compiled by Dr. Dia Crawford.  Adjustments made on day of d/c by Dr. Thereasa Solo to reflect necessary changes in tx plan.

## 2017-09-14 NOTE — Progress Notes (Signed)
Inpatient Diabetes Program Recommendations  AACE/ADA: New Consensus Statement on Inpatient Glycemic Control (2015)  Target Ranges:  Prepandial:   less than 140 mg/dL      Peak postprandial:   less than 180 mg/dL (1-2 hours)      Critically ill patients:  140 - 180 mg/dL   Lab Results  Component Value Date   GLUCAP 192 (H) 09/14/2017   HGBA1C 14.6 (H) 05/26/2017    Review of Glycemic ControlResults for [name], [name] (MRN [id number]) as of 09/14/2017 11:10  Ref. Range 09/13/2017 19:44 09/13/2017 23:23 09/14/2017 04:01 09/14/2017 07:56 09/14/2017 11:06  Glucose-Capillary Latest Ref Range: 65 - 99 mg/dL 212 (H) 200 (H) 191 (H) 192 (H) 184 (H)    Diabetes history: DM type 2 Outpatient Diabetes medications: 70/30 20 units bid Current orders for Inpatient glycemic control:  Novolog resistant q 4 hours, Lantus 20 units bid  Inpatient Diabetes Program Recommendations:   Consider increasing Lantus to 24 units bid.    Thanks,  Adah Perl, RN, BC-ADM Inpatient Diabetes Coordinator Pager [phone] (8a-5p)

## 2017-09-14 NOTE — Clinical Social Work Placement (Signed)
   CLINICAL SOCIAL WORK PLACEMENT  NOTE  Date:  09/14/2017  Patient Details  Name: [name] MRN: [id number] Date of Birth: [birth year]  Clinical Social Work is seeking post-discharge placement for this patient at the [place] level of care (*CSW will initial, date and re-position this form in  chart as items are completed):  Yes   Patient/family provided with [facility] Work Department's list of facilities offering this level of care within the geographic area requested by the patient (or if unable, by the patient's family).  Yes   Patient/family informed of their freedom to choose among providers that offer the needed level of care, that participate in Medicare, Medicaid or managed care program needed by the patient, have an available bed and are willing to accept the patient.  Yes   Patient/family informed of [facility]'s ownership interest in [facility] and [facility], as well as of the fact that they are under no obligation to receive care at these facilities.  PASRR submitted to EDS on       PASRR number received on       Existing PASRR number confirmed on       FL2 transmitted to all facilities in geographic area requested by pt/family on 09/07/17     FL2 transmitted to all facilities within larger geographic area on       Patient informed that his/her managed care company has contracts with or will negotiate with certain facilities, including the following:        Yes   Patient/family informed of bed offers received.  Patient [facility] bed at [facility]     Physician recommends and patient chooses bed at      Patient to be transferred to [facility] on 09/14/17.  Patient to be transferred to facility by [facility]     Patient family notified on 09/14/17 of transfer.  Name of family member notified:  Royston Bake     PHYSICIAN Please sign FL2     Additional Comment:     _______________________________________________ [name], LCSW 09/14/2017, 3:07 PM

## 2017-09-14 NOTE — Progress Notes (Signed)
CRITICAL VALUE ALERT  Critical Value:  Dilantin level 32.2  Date & Time Notied:  09/14/17 0535  Provider Notified: Silas [place]

## 2017-10-08 ENCOUNTER — Emergency Department (HOSPITAL_COMMUNITY): Payer: Medicaid Other

## 2017-10-08 ENCOUNTER — Other Ambulatory Visit: Payer: Self-pay

## 2017-10-08 ENCOUNTER — Inpatient Hospital Stay (HOSPITAL_COMMUNITY)
Admission: EM | Admit: 2017-10-08 | Discharge: 2017-10-18 | DRG: 872 | Disposition: A | Discharge status: Skilled Nursing Facility | Payer: Medicaid Other | Attending: Internal Medicine | Admitting: Internal Medicine

## 2017-10-08 ENCOUNTER — Inpatient Hospital Stay (HOSPITAL_COMMUNITY): Payer: Medicaid Other

## 2017-10-08 ENCOUNTER — Encounter (HOSPITAL_COMMUNITY): Payer: Self-pay | Admitting: Emergency Medicine

## 2017-10-08 DIAGNOSIS — Z931 Gastrostomy status: Secondary | ICD-10-CM | POA: Diagnosis not present

## 2017-10-08 DIAGNOSIS — E785 Hyperlipidemia, unspecified: Secondary | ICD-10-CM | POA: Diagnosis present

## 2017-10-08 DIAGNOSIS — E870 Hyperosmolality and hypernatremia: Secondary | ICD-10-CM | POA: Diagnosis present

## 2017-10-08 DIAGNOSIS — I82721 Chronic embolism and thrombosis of deep veins of right upper extremity: Secondary | ICD-10-CM | POA: Diagnosis present

## 2017-10-08 DIAGNOSIS — D631 Anemia in chronic kidney disease: Secondary | ICD-10-CM | POA: Diagnosis present

## 2017-10-08 DIAGNOSIS — E872 Acidosis, unspecified: Secondary | ICD-10-CM

## 2017-10-08 DIAGNOSIS — A4151 Sepsis due to Escherichia coli [E. coli]: Principal | ICD-10-CM | POA: Diagnosis present

## 2017-10-08 DIAGNOSIS — G40909 Epilepsy, unspecified, not intractable, without status epilepticus: Secondary | ICD-10-CM | POA: Diagnosis present

## 2017-10-08 DIAGNOSIS — A419 Sepsis, unspecified organism: Secondary | ICD-10-CM | POA: Diagnosis present

## 2017-10-08 DIAGNOSIS — S300XXA Contusion of lower back and pelvis, initial encounter: Secondary | ICD-10-CM | POA: Diagnosis present

## 2017-10-08 DIAGNOSIS — N183 Chronic kidney disease, stage 3 (moderate): Secondary | ICD-10-CM | POA: Diagnosis present

## 2017-10-08 DIAGNOSIS — N179 Acute kidney failure, unspecified: Secondary | ICD-10-CM | POA: Diagnosis present

## 2017-10-08 DIAGNOSIS — D649 Anemia, unspecified: Secondary | ICD-10-CM | POA: Diagnosis not present

## 2017-10-08 DIAGNOSIS — Z7401 Bed confinement status: Secondary | ICD-10-CM

## 2017-10-08 DIAGNOSIS — W19XXXA Unspecified fall, initial encounter: Secondary | ICD-10-CM

## 2017-10-08 DIAGNOSIS — E860 Dehydration: Secondary | ICD-10-CM | POA: Diagnosis not present

## 2017-10-08 DIAGNOSIS — J9501 Hemorrhage from tracheostomy stoma: Secondary | ICD-10-CM | POA: Diagnosis present

## 2017-10-08 DIAGNOSIS — E1165 Type 2 diabetes mellitus with hyperglycemia: Secondary | ICD-10-CM | POA: Diagnosis present

## 2017-10-08 DIAGNOSIS — Z7901 Long term (current) use of anticoagulants: Secondary | ICD-10-CM | POA: Diagnosis not present

## 2017-10-08 DIAGNOSIS — R197 Diarrhea, unspecified: Secondary | ICD-10-CM | POA: Diagnosis present

## 2017-10-08 DIAGNOSIS — F17210 Nicotine dependence, cigarettes, uncomplicated: Secondary | ICD-10-CM | POA: Diagnosis present

## 2017-10-08 DIAGNOSIS — I69319 Unspecified symptoms and signs involving cognitive functions following cerebral infarction: Secondary | ICD-10-CM | POA: Diagnosis not present

## 2017-10-08 DIAGNOSIS — D62 Acute posthemorrhagic anemia: Secondary | ICD-10-CM | POA: Diagnosis present

## 2017-10-08 DIAGNOSIS — Z823 Family history of stroke: Secondary | ICD-10-CM

## 2017-10-08 DIAGNOSIS — R0602 Shortness of breath: Secondary | ICD-10-CM

## 2017-10-08 DIAGNOSIS — N390 Urinary tract infection, site not specified: Secondary | ICD-10-CM | POA: Diagnosis present

## 2017-10-08 DIAGNOSIS — I130 Hypertensive heart and chronic kidney disease with heart failure and stage 1 through stage 4 chronic kidney disease, or unspecified chronic kidney disease: Secondary | ICD-10-CM | POA: Diagnosis present

## 2017-10-08 DIAGNOSIS — L89150 Pressure ulcer of sacral region, unstageable: Secondary | ICD-10-CM | POA: Diagnosis present

## 2017-10-08 DIAGNOSIS — Z833 Family history of diabetes mellitus: Secondary | ICD-10-CM

## 2017-10-08 DIAGNOSIS — I69351 Hemiplegia and hemiparesis following cerebral infarction affecting right dominant side: Secondary | ICD-10-CM

## 2017-10-08 DIAGNOSIS — Z7982 Long term (current) use of aspirin: Secondary | ICD-10-CM

## 2017-10-08 DIAGNOSIS — I5032 Chronic diastolic (congestive) heart failure: Secondary | ICD-10-CM | POA: Diagnosis present

## 2017-10-08 DIAGNOSIS — Z87440 Personal history of urinary (tract) infections: Secondary | ICD-10-CM

## 2017-10-08 DIAGNOSIS — Z794 Long term (current) use of insulin: Secondary | ICD-10-CM

## 2017-10-08 DIAGNOSIS — E1122 Type 2 diabetes mellitus with diabetic chronic kidney disease: Secondary | ICD-10-CM | POA: Diagnosis present

## 2017-10-08 HISTORY — DX: Cerebral infarction, unspecified: I63.9

## 2017-10-08 LAB — BASIC METABOLIC PANEL
ANION GAP: 12 (ref 5–15)
BUN: 229 mg/dL — ABNORMAL HIGH (ref 6–20)
CALCIUM: 8.9 mg/dL (ref 8.9–10.3)
CO2: 21 mmol/L — ABNORMAL LOW (ref 22–32)
Chloride: 115 mmol/L — ABNORMAL HIGH (ref 101–111)
Creatinine, Ser: 2.39 mg/dL — ABNORMAL HIGH (ref 0.44–1.00)
GFR calc non Af Amer: 21 mL/min — ABNORMAL LOW (ref >60)
GFR, EST AFRICAN AMERICAN: 24 mL/min — AB (ref >60)
Glucose, Bld: 267 mg/dL — ABNORMAL HIGH (ref 65–99)
Potassium: 4.2 mmol/L (ref 3.5–5.1)
Sodium: 148 mmol/L — ABNORMAL HIGH (ref 135–145)

## 2017-10-08 LAB — GLUCOSE, CAPILLARY
GLUCOSE-CAPILLARY: 235 mg/dL — AB (ref 65–99)
Glucose-Capillary: 277 mg/dL — ABNORMAL HIGH (ref 65–99)
Glucose-Capillary: 223 mg/dL — ABNORMAL HIGH (ref 65–99)

## 2017-10-08 LAB — MRSA PCR SCREENING: MRSA by PCR: NEGATIVE

## 2017-10-08 LAB — PROTIME-INR
INR: 2.47
INR: 2.41
PROTHROMBIN TIME: 26 s — AB (ref 11.4–15.2)
Prothrombin Time: 26.5 seconds — ABNORMAL HIGH (ref 11.4–15.2)

## 2017-10-08 LAB — COMPREHENSIVE METABOLIC PANEL
ALK PHOS: 45 U/L (ref 38–126)
ALT: 26 U/L (ref 14–54)
AST: 35 U/L (ref 15–41)
Albumin: 2.6 g/dL — ABNORMAL LOW (ref 3.5–5.0)
Anion gap: 16 — ABNORMAL HIGH (ref 5–15)
BUN: 92 mg/dL — ABNORMAL HIGH (ref 6–20)
CALCIUM: 9.2 mg/dL (ref 8.9–10.3)
CO2: 22 mmol/L (ref 22–32)
CREATININE: 3.46 mg/dL — AB (ref 0.44–1.00)
Chloride: 102 mmol/L (ref 101–111)
GFR calc non Af Amer: 13 mL/min — ABNORMAL LOW (ref >60)
GFR, EST AFRICAN AMERICAN: 16 mL/min — AB (ref >60)
GLUCOSE: 313 mg/dL — AB (ref 65–99)
Potassium: 5.1 mmol/L (ref 3.5–5.1)
Sodium: 140 mmol/L (ref 135–145)
Total Bilirubin: 0.5 mg/dL (ref 0.3–1.2)
Total Protein: 8 g/dL (ref 6.5–8.1)

## 2017-10-08 LAB — CBC
HCT: 19 % — ABNORMAL LOW (ref 36.0–46.0)
HCT: 28.9 % — ABNORMAL LOW (ref 36.0–46.0)
HEMOGLOBIN: 9.4 g/dL — AB (ref 12.0–15.0)
Hemoglobin: 6.1 g/dL — CL (ref 12.0–15.0)
MCH: 31 pg (ref 26.0–34.0)
MCH: 28.9 pg (ref 26.0–34.0)
MCHC: 32.1 g/dL (ref 30.0–36.0)
MCHC: 32.5 g/dL (ref 30.0–36.0)
MCV: 96.4 fL (ref 78.0–100.0)
MCV: 88.9 fL (ref 78.0–100.0)
PLATELETS: 324 10*3/uL (ref 150–400)
PLATELETS: 268 10*3/uL (ref 150–400)
RBC: 1.97 MIL/uL — AB (ref 3.87–5.11)
RBC: 3.25 MIL/uL — AB (ref 3.87–5.11)
RDW: 18 % — ABNORMAL HIGH (ref 11.5–15.5)
RDW: 18.5 % — ABNORMAL HIGH (ref 11.5–15.5)
WBC: 18.2 10*3/uL — ABNORMAL HIGH (ref 4.0–10.5)
WBC: 13.1 10*3/uL — ABNORMAL HIGH (ref 4.0–10.5)

## 2017-10-08 LAB — CBG MONITORING, ED
GLUCOSE-CAPILLARY: 285 mg/dL — AB (ref 65–99)
Glucose-Capillary: 294 mg/dL — ABNORMAL HIGH (ref 65–99)

## 2017-10-08 LAB — URINALYSIS, ROUTINE W REFLEX MICROSCOPIC
BILIRUBIN URINE: NEGATIVE
GLUCOSE, UA: NEGATIVE mg/dL
KETONES UR: NEGATIVE mg/dL
NITRITE: NEGATIVE
PH: 7 (ref 5.0–8.0)
Protein, ur: 30 mg/dL — AB
Specific Gravity, Urine: 1.012 (ref 1.005–1.030)

## 2017-10-08 LAB — PREPARE RBC (CROSSMATCH)

## 2017-10-08 LAB — HEMOGLOBIN A1C
Hgb A1c MFr Bld: 8.3 % — ABNORMAL HIGH (ref 4.8–5.6)
Mean Plasma Glucose: 191.51 mg/dL

## 2017-10-08 LAB — POC OCCULT BLOOD, ED: Fecal Occult Bld: NEGATIVE

## 2017-10-08 LAB — PHENYTOIN LEVEL, TOTAL: Phenytoin Lvl: 10.3 ug/mL (ref 10.0–20.0)

## 2017-10-08 MED ORDER — PIPERACILLIN-TAZOBACTAM 3.375 G IVPB 30 MIN
3.3750 g | Freq: Once | INTRAVENOUS | Status: DC
  Filled 2017-10-08: qty 50

## 2017-10-08 MED ORDER — INSULIN ASPART 100 UNIT/ML ~~LOC~~ SOLN
0.0000 [IU] | Freq: Three times a day (TID) | SUBCUTANEOUS | Status: DC
  Administered 2017-10-08: 3 [IU] via SUBCUTANEOUS
  Administered 2017-10-08: 5 [IU] via SUBCUTANEOUS

## 2017-10-08 MED ORDER — LEVETIRACETAM 100 MG/ML PO SOLN
1000.0000 mg | Freq: Two times a day (BID) | ORAL | Status: DC
  Administered 2017-10-08 – 2017-10-18 (×21): 1000 mg
  Filled 2017-10-08 (×21): qty 10

## 2017-10-08 MED ORDER — PHENYTOIN 125 MG/5ML PO SUSP
250.0000 mg | Freq: Two times a day (BID) | ORAL | Status: DC
  Administered 2017-10-08 – 2017-10-15 (×15): 250 mg
  Filled 2017-10-08 (×18): qty 10

## 2017-10-08 MED ORDER — VANCOMYCIN HCL IN DEXTROSE 1-5 GM/200ML-% IV SOLN: 1000.0000 mg | Freq: Once | INTRAVENOUS | Status: DC

## 2017-10-08 MED ORDER — VANCOMYCIN HCL 10 G IV SOLR
1750.0000 mg | Freq: Once | INTRAVENOUS | Status: AC
  Administered 2017-10-08: 1750 mg via INTRAVENOUS
  Filled 2017-10-08: qty 1000

## 2017-10-08 MED ORDER — CHLORHEXIDINE GLUCONATE 0.12 % MT SOLN
15.0000 mL | Freq: Two times a day (BID) | OROMUCOSAL | Status: DC
  Administered 2017-10-08 – 2017-10-18 (×19): 15 mL via OROMUCOSAL
  Filled 2017-10-08 (×20): qty 15

## 2017-10-08 MED ORDER — PANTOPRAZOLE SODIUM 40 MG IV SOLR
40.0000 mg | Freq: Two times a day (BID) | INTRAVENOUS | Status: DC
  Administered 2017-10-08 – 2017-10-18 (×20): 40 mg via INTRAVENOUS
  Filled 2017-10-08 (×21): qty 40

## 2017-10-08 MED ORDER — PHENYTOIN 125 MG/5ML PO SUSP: 250.0000 mg | Freq: Two times a day (BID) | ORAL | Status: DC

## 2017-10-08 MED ORDER — ORAL CARE MOUTH RINSE
15.0000 mL | Freq: Two times a day (BID) | OROMUCOSAL | Status: DC
  Administered 2017-10-09 – 2017-10-18 (×18): 15 mL via OROMUCOSAL

## 2017-10-08 MED ORDER — ALBUTEROL SULFATE (2.5 MG/3ML) 0.083% IN NEBU: 2.5000 mg | INHALATION_SOLUTION | RESPIRATORY_TRACT | Status: DC | PRN

## 2017-10-08 MED ORDER — SODIUM CHLORIDE 0.9 % IV BOLUS
1000.0000 mL | Freq: Once | INTRAVENOUS | Status: AC
  Administered 2017-10-08: 1000 mL via INTRAVENOUS

## 2017-10-08 MED ORDER — TOPIRAMATE 100 MG PO TABS
200.0000 mg | ORAL_TABLET | Freq: Two times a day (BID) | ORAL | Status: DC
  Administered 2017-10-08 – 2017-10-18 (×21): 200 mg
  Filled 2017-10-08 (×21): qty 2

## 2017-10-08 MED ORDER — PRO-STAT SUGAR FREE PO LIQD
30.0000 mL | Freq: Three times a day (TID) | ORAL | Status: DC
  Administered 2017-10-08 – 2017-10-10 (×7): 30 mL
  Filled 2017-10-08 (×7): qty 30

## 2017-10-08 MED ORDER — NYSTATIN 100000 UNIT/ML MT SUSP
5.0000 mL | Freq: Four times a day (QID) | OROMUCOSAL | Status: DC
  Administered 2017-10-08 – 2017-10-18 (×41): 500000 [IU] via ORAL
  Filled 2017-10-08 (×38): qty 5

## 2017-10-08 MED ORDER — COLLAGENASE 250 UNIT/GM EX OINT
TOPICAL_OINTMENT | Freq: Every day | CUTANEOUS | Status: DC
  Administered 2017-10-08 – 2017-10-18 (×11): via TOPICAL

## 2017-10-08 MED ORDER — PIPERACILLIN-TAZOBACTAM IN DEX 2-0.25 GM/50ML IV SOLN
2.2500 g | Freq: Four times a day (QID) | INTRAVENOUS | Status: DC
  Administered 2017-10-08 – 2017-10-09 (×4): 2.25 g via INTRAVENOUS
  Filled 2017-10-08 (×5): qty 50

## 2017-10-08 MED ORDER — SODIUM CHLORIDE 0.9 % IV SOLN: Freq: Once | INTRAVENOUS | Status: DC

## 2017-10-08 MED ORDER — PHENYTOIN 125 MG/5ML PO SUSP
250.0000 mg | Freq: Two times a day (BID) | ORAL | Status: DC
  Filled 2017-10-08 (×2): qty 10

## 2017-10-08 MED ORDER — LACOSAMIDE 50 MG PO TABS
200.0000 mg | ORAL_TABLET | Freq: Two times a day (BID) | ORAL | Status: DC
  Administered 2017-10-08 – 2017-10-18 (×21): 200 mg
  Filled 2017-10-08 (×21): qty 4

## 2017-10-08 MED ORDER — COLLAGENASE 250 UNIT/GM EX OINT
TOPICAL_OINTMENT | Freq: Every day | CUTANEOUS | Status: DC
  Filled 2017-10-08: qty 90

## 2017-10-08 NOTE — ED Provider Notes (Signed)
[facility] DEPT Provider Note   CSN: [id number] Arrival date & time: 10/08/17  0330     History   Chief Complaint Chief Complaint  Patient presents with  . Bleeding Trach  Level 5 caveat due to altered mental status  HPI [name] is a 62 y.o. female.  The history is provided by the EMS personnel. The history is limited by the condition of the patient.  Patient presents from nursing facility.  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  Per the nursing staff at [facility], patient had a large blood clot extracted from the trach around 12 AM.  This is more than what is usually suctioned from her trach.  She was sent in for further evaluation.  It was also noted that she had some elevated glucose.  She has minimal interaction at baseline due to previous stroke  Past Medical History:  Diagnosis Date  . Acute deep vein thrombosis (DVT) of right upper extremity ([place]) 08/30/2017  . Chronic diastolic CHF (congestive heart failure) ([place]) 08/30/2017  . Cocaine abuse, continuous ([place])   . Diabetes mellitus    Hb A1C = 12.6 on 05/15/11, managed on Novolog 70/30, 35 U qam, 25 U qpm  . Hypertension    poorly controlled  . Insomnia disorder   . Shortness of breath   . Stroke [facility])     Patient Active Problem List   Diagnosis Date Noted  . Acute bilat watershed infarction [facility])   . Acute respiratory failure with hypoxia ([place])   . Essential hypertension   . Diabetes mellitus type 2, uncontrolled, with complications ([place])   . Chronic diastolic CHF (congestive heart failure) ([place]) 08/30/2017  . Acute deep vein thrombosis (DVT) of right upper extremity ([place]) 08/30/2017  . Tracheostomy status ([place])   . Acute respiratory failure with hypoxemia ([place])   . Pressure injury of skin 08/04/2017  . Altered mental status   . H/O open leg wound 06/04/2017  . Open leg  wound 06/04/2017  . Acute metabolic encephalopathy 40/98/1191  . Intracranial bleed ([place]) 05/26/2017  . ICH (intracerebral hemorrhage) ([place]) 05/26/2017  . Type II diabetes mellitus with renal manifestations ([place]) 05/25/2017  . SIRS (systemic inflammatory response syndrome) ([place]) 05/25/2017  . Aspiration pneumonia due to gastric secretions ([place])   . Confusion 05/10/2017  . Chronic hepatitis C without hepatic coma ([place])   . Labile blood glucose   . Hypoglycemia associated with type 2 diabetes mellitus ([place])   . Poorly controlled type 2 diabetes mellitus with peripheral neuropathy ([place])   . Neurologic gait disorder   . Neuropathic pain   . Type 2 diabetes mellitus with peripheral neuropathy (HCC)   . History of intracranial hemorrhage   . Diarrhea   . Urinary incontinence   . Hypokalemia   . Polysubstance abuse ([place])   . Cerebrovascular accident (CVA) ([place])   . Disorientation   . Meningitis 11/02/2016  . Type 2 diabetes mellitus with hyperosmolar nonketotic hyperglycemia ([place]) 10/31/2016  . Seizure ([place]) 10/31/2016  . ARF (acute renal failure) ([place]) 10/31/2016  . Severe sepsis ([place]) 10/31/2016  . DKA (diabetic ketoacidoses) ([place]) 10/31/2016  . Acute renal failure superimposed on stage 3 chronic kidney disease ([place])   . Right hemiparesis ([place]) 06/09/2016  . 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 Cytotoxic brain edema ([place]) 06/08/2016  . Encephalopathy acute 06/06/2016  . Chest pain, musculoskeletal  05/16/2011  . Vaginal candidiasis 05/16/2011  . Diabetes mellitus, type 2 ([place]) 05/16/2011  . CHEST PAIN 10/01/2009  . CELLULITIS AND ABSCESS OF LEG EXCEPT FOOT 08/31/2009  . INSOMNIA 07/13/2008  . MRSA 04/17/2008  . NECK MASS 04/17/2008  . COCAINE ABUSE 11/01/2007  . OBESITY NOS 07/14/2006  . DENTAL CARIES 07/14/2006  . DIABETES MELLITUS, TYPE II 04/26/2006  . Alcohol abuse 04/26/2006  . TOBACCO ABUSE 04/26/2006  . DEPRESSION 04/26/2006  . Uncontrolled hypertension 04/26/2006    . GERD 04/26/2006  . LOW BACK PAIN 04/26/2006    Past Surgical History:  Procedure Laterality Date  . ANKLE FRACTURE SURGERY  2007  . IR GASTROSTOMY TUBE MOD SED  08/17/2017     OB History   None      Home Medications    Prior to Admission medications   Medication Sig Start Date End Date Taking? Authorizing Provider  acetaminophen (TYLENOL) 160 MG/5ML solution Place 10.2 mLs (325 mg total) into feeding tube every 6 (six) hours as needed for mild pain, headache or fever. 09/13/17   Allie Bossier, MD  albuterol (PROVENTIL) (2.5 MG/3ML) 0.083% nebulizer solution Take 3 mLs (2.5 mg total) by nebulization every 2 (two) hours as needed for wheezing. 09/13/17   Allie Bossier, MD  Amino Acids-Protein Hydrolys (FEEDING SUPPLEMENT, PRO-STAT SUGAR FREE 64,) LIQD Place 30 mLs into feeding tube 3 (three) times daily. 09/13/17   Allie Bossier, MD  amLODipine (NORVASC) 10 MG tablet Place 1 tablet (10 mg total) into feeding tube daily. 09/14/17   Allie Bossier, MD  aspirin 81 MG chewable tablet Place 1 tablet (81 mg total) into feeding tube daily. 09/14/17   Allie Bossier, MD  atorvastatin (LIPITOR) 40 MG tablet Place 1 tablet (40 mg total) into feeding tube daily at 6 PM. 09/13/17   Allie Bossier, MD  cloNIDine (CATAPRES) 0.2 MG tablet Place 1 tablet (0.2 mg total) into feeding tube 3 (three) times daily. 09/13/17   Allie Bossier, MD  collagenase (SANTYL) ointment Apply topically daily. Apply Santyl to right leg wound Q day, then cover with moist gauze and dry gauze and kerlex 06/05/17   Aline August, MD  docusate (COLACE) 50 MG/5ML liquid Place 5 mLs (50 mg total) into feeding tube 2 (two) times daily as needed for mild constipation. 09/13/17   Allie Bossier, MD  enalapril (VASOTEC) 10 MG tablet Place 3 tablets (30 mg total) into feeding tube daily. 09/14/17   Allie Bossier, MD  enoxaparin (LOVENOX) 80 MG/0.8ML injection Inject 0.8 mLs (80 mg total) into the skin every 12 (twelve) hours. 09/13/17    Allie Bossier, MD  folic acid (FOLVITE) 1 MG tablet Take 1 tablet (1 mg total) by mouth daily. 06/05/17   Aline August, MD  furosemide (LASIX) 40 MG tablet Place 1 tablet (40 mg total) into feeding tube 2 (two) times daily. 09/13/17   Allie Bossier, MD  hydrALAZINE (APRESOLINE) 100 MG tablet Place 1 tablet (100 mg total) into feeding tube every 8 (eight) hours. 09/13/17   Allie Bossier, MD  insulin glargine (LANTUS) 100 UNIT/ML injection Inject 0.2 mLs (20 Units total) into the skin 2 (two) times daily. 09/13/17   Allie Bossier, MD  labetalol (NORMODYNE) 200 MG tablet Place 2 tablets (400 mg total) into feeding tube 3 (three) times daily. 09/13/17   Allie Bossier, MD  lacosamide (VIMPAT) 200 MG TABS tablet Place 1 tablet (200 mg total) into feeding  tube 2 (two) times daily. 09/13/17   Allie Bossier, MD  levETIRAcetam (KEPPRA) 100 MG/ML solution Place 10 mLs (1,000 mg total) into feeding tube 2 (two) times daily. 09/13/17   Allie Bossier, MD  LORazepam (ATIVAN) 2 MG/ML injection Inject 0.5 mLs (1 mg total) into the vein as needed for seizure (Give if seizure lasts longer than 5 minutes). 09/13/17   Allie Bossier, MD  Multiple Vitamin (MULTIVITAMIN WITH MINERALS) TABS tablet Take 1 tablet by mouth daily. 06/20/16   Angiulli, Lavon Paganini, PA-C  Nutritional Supplements (FEEDING SUPPLEMENT, JEVITY 1.2 CAL,) LIQD Place 1,000 mLs into feeding tube continuous. 09/13/17   Allie Bossier, MD  nystatin cream (MYCOSTATIN) Apply topically 2 (two) times daily. 06/04/17   Aline August, MD  pantoprazole sodium (PROTONIX) 40 mg/20 mL PACK Place 20 mLs (40 mg total) into feeding tube at bedtime. 09/13/17   Allie Bossier, MD  scopolamine (TRANSDERM-SCOP) 1 MG/3DAYS Place 1 patch (1.5 mg total) onto the skin every 3 (three) days. 09/14/17   Allie Bossier, MD  thiamine 100 MG tablet Place 1 tablet (100 mg total) into feeding tube daily. 09/14/17   Allie Bossier, MD  topiramate (TOPAMAX) 200 MG tablet Place 1 tablet (200  mg total) into feeding tube 2 (two) times daily. 09/13/17   Allie Bossier, MD  warfarin (COUMADIN) 5 MG tablet Take 1 tablet (5 mg total) by mouth daily at 6 PM. 09/14/17   Allie Bossier, MD    Family History Family History  Problem Relation Age of Onset  . Stroke Father   . Diabetes Sister   . Diabetes Brother     Social History Social History   Tobacco Use  . Smoking status: Current Every Day Smoker    Packs/day: 1.50    Years: 25.00    Pack years: 37.50    Types: Cigarettes  . Smokeless tobacco: Never Used  Substance Use Topics  . Alcohol use: Yes    Alcohol/week: 4.8 oz    Types: 2 Glasses of wine, 6 Cans of beer per week    Comment: pint of liquor a week  . Drug use: Yes    Types: Cocaine    Comment: 07/2015 last use      Allergies   Patient has no known allergies.   Review of Systems Review of Systems  Unable to perform ROS: Mental status change     Physical Exam Updated Vital Signs BP 128/78 (BP Location: Left Arm)   Pulse 87   Resp (!) 38   SpO2 99%   Physical Exam  CONSTITUTIONAL: Chronically ill-appearing, does not follow commands HEAD: Normocephalic/atraumatic ENMT: Mucous membranes moist NECK: supple trach in place, dried blood noted, no active bleeding, no crepitus to neck CV: S1/S2 noted, no murmurs/rubs/gallops noted LUNGS: Mild tachypnea, coarse breath sounds bilaterally ABDOMEN: soft, nontender, PEG in place, no bleeding from PEG tube NEURO: Pt is awake but does not follow commands EXTREMITIES: No deformities SKIN: warm, color normal, sacral wounds noted PSYCH: Unable to assess  ED Treatments / Results  Labs (all labs ordered are listed, but only abnormal results are displayed) Labs Reviewed  COMPREHENSIVE METABOLIC PANEL - Abnormal; Notable for the following components:      Result Value   Glucose, Bld 313 (*)    BUN 92 (*)    Creatinine, Ser 3.46 (*)    Albumin 2.6 (*)    GFR calc non Af Amer 13 (*)    GFR  calc Af Amer 16  (*)    Anion gap 16 (*)    All other components within normal limits  CBC - Abnormal; Notable for the following components:   WBC 18.2 (*)    RBC 1.97 (*)    Hemoglobin 6.1 (*)    HCT 19.0 (*)    RDW 18.0 (*)    All other components within normal limits  PROTIME-INR - Abnormal; Notable for the following components:   Prothrombin Time 26.5 (*)    All other components within normal limits  CBG MONITORING, ED - Abnormal; Notable for the following components:   Glucose-Capillary 294 (*)    All other components within normal limits  PHENYTOIN LEVEL, TOTAL  URINALYSIS, ROUTINE W REFLEX MICROSCOPIC  CBG MONITORING, ED  POC OCCULT BLOOD, ED  TYPE AND SCREEN  PREPARE RBC (CROSSMATCH)    EKG None  Radiology Dg Chest Port 1 View  Result Date: 10/08/2017 CLINICAL DATA:  62 year old female with shortness of breath. EXAM: PORTABLE CHEST 1 VIEW COMPARISON:  Chest radiograph dated 09/08/2017 FINDINGS: Tracheostomy above the carina. Bibasilar linear and streaky atelectatic changes with interval improvement compared to prior radiograph. There is no focal consolidation, pleural effusion, or pneumothorax. Mild cardiomegaly. No acute osseous pathology. IMPRESSION: 1. Tracheostomy above the carina. 2. Interval improvement of bibasilar atelectasis/scarring. Electronically Signed   By: Anner Crete M.D.   On: 10/08/2017 04:49    Procedures Procedures  CRITICAL CARE Performed by: Sharyon Cable Total critical care time: 33 minutes Critical care time was exclusive of separately billable procedures and treating other patients. Critical care was necessary to treat or prevent imminent or life-threatening deterioration. Critical care was time spent personally by me on the following activities: development of treatment plan with patient and/or surrogate as well as nursing, discussions with consultants, evaluation of patient's response to treatment, examination of patient, obtaining history from  patient or surrogate, ordering and performing treatments and interventions, ordering and review of laboratory studies, ordering and review of radiographic studies, pulse oximetry and re-evaluation of patient's condition. Patient with acute anemia, and acute renal failure, requiring admission. Medications Ordered in ED Medications  sodium chloride 0.9 % bolus 1,000 mL (1,000 mLs Intravenous New Bag/Given 10/08/17 0604)  pantoprazole (PROTONIX) injection 40 mg (has no administration in time range)     Initial Impression / Assessment and Plan / ED Course  I have reviewed the triage vital signs and the nursing notes.  Pertinent labs & imaging results that were available during my care of the patient were reviewed by me and considered in my medical decision making (see chart for details).     6:16 AM Patient initially presented for bleeding trach.  This is stabilized, no active bleeding trach at this time.  However she was noted to have acute anemia, hemoglobin down to 6.  She also has acute renal failure with creatinine over 3.  She is still producing urine and does not have an obvious urinary obstruction. She has no signs of GI bleed, Hemoccult negative Unclear cause of anemia. No focal abdominal tenderness, no bleeding from PEG tube.  Will need to be admitted, discussed with Dr. Tamala Julian for admission Inpatient team will order blood transfusion  Checked on patient multiple times, she is resting comfortably Final Clinical Impressions(s) / ED Diagnoses   Final diagnoses:  AKI (acute kidney injury) [facility])  Acute anemia    ED Discharge Orders    None       Ripley Fraise, MD 10/08/17 [phone]

## 2017-10-08 NOTE — ED Provider Notes (Signed)
Soon after evaluation by the inpatient team, her blood pressure dropped to the 70s.  She is getting fluids wide open and her pressures now in the 80s.  Unclear cause of hypotension.  Sepsis is a possibility, lactate pending we will also obtain CT abdomen pelvis to evaluate for any sort of intra-abdominal bleeding. Stepdown admission   Ripley Fraise, MD 10/08/17 [phone]

## 2017-10-08 NOTE — Consult Note (Signed)
[facility] Nurse wound consult note Reason for Consult: Unstageable wound At the coccyx area the patient has a wound that measures 3 cm x 1.2 cm x unknown depth.  The wound bed is light yellow in color.  No odor, no drainage.  The plan for this area is santyl, saline moistened gauze, and ABD or foam dressing; change daily. There is a smaller area to the left of the main wound described above, and more on the buttock, that measures 0.7 cm x 0.6 cm is 100% pink and is most likely related to friction, shear, and moisture. The right posterior upper thigh has an abraded area with a total length of 12 cm x 4 cm x 0.1 cm.  This area looks related to friction, shear, and moisture. The wounds are not contiguous, but have epithelial islands.  Approximately 98% of the areas are pink, a very small area is yellowish in color.  Hydrocolloid dressings to this area will provide autolytic debridement and protect the area from further moisture damage.  The patient is incontinent. Thank you for the consult.  Discussed plan of care with the patient and bedside nurse.  [facility] nurse will not follow at this time.  Please re-consult the [facility] team if needed.  Val Riles, RN, MSN, CWOCN, CNS-BC, pager [phone]

## 2017-10-08 NOTE — ED Notes (Signed)
bp dropped to 70/40  Fluids open wide

## 2017-10-08 NOTE — ED Notes (Signed)
Bed: RESB Expected date:  Expected time:  Means of arrival:  Comments: EMS bleeding from trach

## 2017-10-08 NOTE — Progress Notes (Signed)
Pharmacy Antibiotic Note  [name] is a 62 y.o. female admitted on 10/08/2017 with sepsis.  Pharmacy has been consulted for Vancomycin and Zosyn dosing.  Plan: Vancomycin 1750mg  iv x1, then will plan to check level in about 48hr depending on renal function.   Goal AUC = 400 - 500 for all indications, except meningitis (goal AUC > 500 and Cmin 15-20 mcg/mL)   Zosyn 3.375gm iv x1, then 2.25gm iv q6hr  Weight: 181 lb (82.1 kg)  Temp (24hrs), Avg:97.4 F (36.3 C), Min:97.4 F (36.3 C), Max:97.4 F (36.3 C)  Recent Labs  Lab 10/08/17 0353  WBC 18.2*  CREATININE 3.46*    Estimated Creatinine Clearance: 18.3 mL/min (A) (by C-G formula based on SCr of 3.46 mg/dL (H)).    No Known Allergies  Antimicrobials this admission: Vancomycin 10/08/2017 >> Zosyn 10/08/2017 >>   Dose adjustments this admission: -  Microbiology results: -  Thank you for allowing pharmacy to be a part of this patient's care.  Nani Skillern Crowford 10/08/2017 6:48 AM

## 2017-10-08 NOTE — Progress Notes (Signed)
Inpatient Diabetes Program Recommendations  AACE/ADA: New Consensus Statement on Inpatient Glycemic Control (2015)  Target Ranges:  Prepandial:   less than 140 mg/dL      Peak postprandial:   less than 180 mg/dL (1-2 hours)      Critically ill patients:  140 - 180 mg/dL   Lab Results  Component Value Date   GLUCAP 285 (H) 10/08/2017   HGBA1C 14.6 (H) 05/26/2017    Review of Glycemic Control  Diabetes history: Type 2 Outpatient Diabetes medications: Lantus 40 units bid, Humalog 4-10 units QID Current orders for Inpatient glycemic control: Novolog 0-9 units tidwc HgbA1C pending. Last one 05/26/2017 - 14.6%.  Inpatient Diabetes Program Recommendations:     Add Lantus 15 units bid Change Novolog to 0-9 units Q4H Will likely need TF coverage - Novolog 2-3 units Q4H  Will follow closely.  Thank you. Lorenda Peck, RD, LDN, CDE Inpatient Diabetes Coordinator [phone]

## 2017-10-08 NOTE — Progress Notes (Signed)
RT note: Called to assess trach pt. In ED R-A, upon arrival found pt. on NRB mask @ 15 lpm over intact/inplace #6.0 cuffless [place] trach, c/c >'d # bleeding from trach with suctioning, P-85/sats-99%/R-24, initially placed EtC02 cap on for placement-showing (+), b/l b.s. obtained added 28%/5 lpm humidified oxygen on pt. via aerosol trach collar, sx.'d with # 12sx. cath for small to moderate amount dark red/rusty blood, inner cannula cleaned/replaced, with trach care done, trach ties changed, all necessary equipment placed/labelled at bedside.

## 2017-10-08 NOTE — ED Notes (Signed)
Date and time results received: 10/08/17 5:00 AM (use smartphrase ".now" to insert current time)  Test: Hgb  Critical Value: 6.1  Name of Provider Notified: [place]  Orders Received? Or Actions Taken?: Rectal temperature and hemocult

## 2017-10-08 NOTE — ED Triage Notes (Signed)
Pt BIB EMS from [facility]. Staff heard patient coughing around 0000 and on arrival noticed bleeding from her trach. Suctioned patient over 2 hours and got 200-250 cc of BRB from her trach. Blood was thin with no clots. Per staff, RR is increased from normal. Patient in NAD, sleeping on arrival but arousable.

## 2017-10-08 NOTE — H&P (Signed)
History and Physical    [name] YJE:[id number] DOB: 02/05/1956 DOA: 10/08/2017  PCP: Nolene Ebbs, MD Patient coming from: [facility]  Chief Complaint: Bleeding from the tracheostomy site  HPI: [name] is a 62 y.o. 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  Looking through the notes patient had 200 250 cc of bleeding from the tracheostomy at the nursing home.  Since the patient has come to the ER has been no further bleeding.  History is obtained mainly from the ER records and speaking to patient's daughter.  No documented fever or chills nausea vomiting or diarrhea.  Patient has a trach and PEG.  No complaints of abdominal pain melena hematochezia.  Patient is on Coumadin for history of DVT in 2019 admission right upper extremity where she had a PICC line. Patient has had multiple hospital admissions the last 6 months for diabetic ketoacidosis, acute encephalopathy and stroke, possible acute bacterial meningitis, status epilepticus, Pseudomonas pneumonia and acute respiratory failure with hypoxemia.  According to the daughter she used to speak in the past but since January 2019 she was admitted and discharged on 09/14/2017 ever since she has been not been able to speak but she lays in the bed and just watch TV.  Her last admission was for status epilepticus and acute encephalopathy and stroke secondary to polysubstance abuse and watershed infarcts secondary to cocaine and alcohol abuse.  ED Course: When she first came into the ER her blood pressure was 128/78 and it dropped to  99/49 and 70/40.  she was given emergency 1 unit of blood transfusion and FFP and wide open IV fluids her blood pressure came back up to 129/63, with a heart rate of 88 respiratory rate of 27 saturation 87-94% on oxygen.  When she first arrived to the ER she was tachycardic and tachypneic.  Her vital signs stabilized after IV hydration FFP and blood transfusion.  Patient remained obtunded with eyes open.  She was not able to speak or follow commands.  Her white count was 18.2 hemoglobin was 6.1 platelet count 324, sodium 140 potassium 5.1 BUN 92 creatinine 3.46 which is up from 1.30 early part of March.  Her anion gap is 16 albumin 2.6.  Dilantin level was 10.3 blood sugar was 313 chest x-ray showed interval improvement of bibasilar atelectasis.  A CT scan of the abdomen and pelvis that was done this morning showed probable subcutaneous hematoma in the low right flank and overlying the right iliac wing measures approximately 2.7 x 3 cm.  Her INR was 2.47.   Review of Systems: As per HPI otherwise all other systems reviewed and are negative  Ambulatory Status:  Past Medical History:  Diagnosis Date  . Acute deep vein thrombosis (DVT) of right upper extremity ([place]) 08/30/2017  . Chronic diastolic CHF (congestive heart failure) ([place]) 08/30/2017  . Cocaine abuse, continuous ([place])   . Diabetes mellitus    Hb A1C = 12.6 on 05/15/11, managed on Novolog 70/30, 35 U qam, 25 U qpm  . Hypertension    poorly controlled  . Insomnia disorder   . Shortness of breath   . Stroke [facility])  Past Surgical History:  Procedure Laterality Date  . ANKLE FRACTURE SURGERY  2007  . IR GASTROSTOMY TUBE MOD SED  08/17/2017    Social History   Socioeconomic History  . Marital status: Single    Spouse name: Not on file  . Number of children: Not on file  . Years of education: Not on file  . Highest education level: Not on file  Occupational History  . Not on file  Social Needs  . Financial resource strain: Not on  file  . Food insecurity:    Worry: Not on file    Inability: Not on file  . Transportation needs:    Medical: Not on file    Non-medical: Not on file  Tobacco Use  . Smoking status: Current Every Day Smoker    Packs/day: 1.50    Years: 25.00    Pack years: 37.50    Types: Cigarettes  . Smokeless tobacco: Never Used  Substance and Sexual Activity  . Alcohol use: Yes    Alcohol/week: 4.8 oz    Types: 2 Glasses of wine, 6 Cans of beer per week    Comment: pint of liquor a week  . Drug use: Yes    Types: Cocaine    Comment: 07/2015 last use   . Sexual activity: Yes    Birth control/protection: Post-menopausal  Lifestyle  . Physical activity:    Days per week: Not on file    Minutes per session: Not on file  . Stress: Not on file  Relationships  . Social connections:    Talks on phone: Not on file    Gets together: Not on file    Attends religious service: Not on file    Active member of club or organization: Not on file    Attends meetings of clubs or organizations: Not on file    Relationship status: Not on file  . Intimate partner violence:    Fear of current or ex partner: Not on file    Emotionally abused: Not on file    Physically abused: Not on file    Forced sexual activity: Not on file  Other Topics Concern  . Not on file  Social History Narrative  . Not on file    No Known Allergies  Family History  Problem Relation Age of Onset  . Stroke Father   . Diabetes Sister   . Diabetes Brother      Prior to Admission medications   Medication Sig Start Date End Date Taking? Authorizing Provider  acetaminophen (TYLENOL) 160 MG/5ML solution Place 10.2 mLs (325 mg total) into feeding tube every 6 (six) hours as needed for mild pain, headache or fever. 09/13/17   Allie Bossier, MD  albuterol (PROVENTIL) (2.5 MG/3ML) 0.083% nebulizer solution Take 3 mLs (2.5 mg total) by nebulization every 2 (two) hours as needed for wheezing. 09/13/17   Allie Bossier, MD  Amino  Acids-Protein Hydrolys (FEEDING SUPPLEMENT, PRO-STAT SUGAR FREE 64,) LIQD Place 30 mLs into feeding tube 3 (three) times daily. 09/13/17   Allie Bossier, MD  amLODipine (NORVASC) 10 MG tablet Place 1 tablet (10 mg total) into feeding tube daily. 09/14/17   Allie Bossier, MD  aspirin 81 MG chewable tablet Place 1 tablet (81 mg total) into feeding tube daily. 09/14/17   Allie Bossier, MD  atorvastatin (LIPITOR) 40 MG tablet Place 1 tablet (40 mg total) into feeding tube daily at 6 PM. 09/13/17   Allie Bossier, MD  cloNIDine (CATAPRES) 0.2 MG tablet Place 1 tablet (0.2 mg total) into feeding tube 3 (three) times daily. 09/13/17   Allie Bossier, MD  collagenase (SANTYL) ointment Apply topically daily. Apply Santyl to right leg wound Q day, then cover with moist gauze and dry gauze and kerlex 06/05/17   Aline August, MD  docusate (COLACE) 50 MG/5ML liquid Place 5 mLs (50 mg total) into feeding tube 2 (two) times daily as needed for mild constipation. 09/13/17   Allie Bossier, MD  enalapril (VASOTEC) 10 MG tablet Place 3 tablets (30 mg total) into feeding tube daily. 09/14/17   Allie Bossier, MD  enoxaparin (LOVENOX) 80 MG/0.8ML injection Inject 0.8 mLs (80 mg total) into the skin every 12 (twelve) hours. 09/13/17   Allie Bossier, MD  folic acid (FOLVITE) 1 MG tablet Take 1 tablet (1 mg total) by mouth daily. 06/05/17   Aline August, MD  furosemide (LASIX) 40 MG tablet Place 1 tablet (40 mg total) into feeding tube 2 (two) times daily. 09/13/17   Allie Bossier, MD  hydrALAZINE (APRESOLINE) 100 MG tablet Place 1 tablet (100 mg total) into feeding tube every 8 (eight) hours. 09/13/17   Allie Bossier, MD  insulin glargine (LANTUS) 100 UNIT/ML injection Inject 0.2 mLs (20 Units total) into the skin 2 (two) times daily. 09/13/17   Allie Bossier, MD  labetalol (NORMODYNE) 200 MG tablet Place 2 tablets (400 mg total) into feeding tube 3 (three) times daily. 09/13/17   Allie Bossier, MD  lacosamide (VIMPAT) 200  MG TABS tablet Place 1 tablet (200 mg total) into feeding tube 2 (two) times daily. 09/13/17   Allie Bossier, MD  levETIRAcetam (KEPPRA) 100 MG/ML solution Place 10 mLs (1,000 mg total) into feeding tube 2 (two) times daily. 09/13/17   Allie Bossier, MD  LORazepam (ATIVAN) 2 MG/ML injection Inject 0.5 mLs (1 mg total) into the vein as needed for seizure (Give if seizure lasts longer than 5 minutes). 09/13/17   Allie Bossier, MD  Multiple Vitamin (MULTIVITAMIN WITH MINERALS) TABS tablet Take 1 tablet by mouth daily. 06/20/16   Angiulli, Lavon Paganini, PA-C  Nutritional Supplements (FEEDING SUPPLEMENT, JEVITY 1.2 CAL,) LIQD Place 1,000 mLs into feeding tube continuous. 09/13/17   Allie Bossier, MD  nystatin cream (MYCOSTATIN) Apply topically 2 (two) times daily. 06/04/17   Aline August, MD  pantoprazole sodium (PROTONIX) 40 mg/20 mL PACK Place 20 mLs (40 mg total) into feeding tube at bedtime. 09/13/17   Allie Bossier, MD  scopolamine (TRANSDERM-SCOP) 1 MG/3DAYS Place 1 patch (1.5 mg total) onto the skin every 3 (three) days. 09/14/17   Allie Bossier, MD  thiamine 100 MG tablet Place 1 tablet (100 mg total) into feeding tube daily. 09/14/17   Allie Bossier, MD  topiramate (TOPAMAX) 200 MG tablet Place 1 tablet (200 mg total) into feeding tube 2 (two) times daily. 09/13/17   Allie Bossier, MD  warfarin (COUMADIN) 5 MG tablet Take 1 tablet (5 mg total) by mouth daily at 6 PM. 09/14/17   Allie Bossier, MD    Physical Exam: Vitals:   10/08/17 0715 10/08/17 0730 10/08/17 0745 10/08/17 0800  BP: 120/61 (!) 130/106 (!) 143/82 129/63  Pulse: 84 86 88 89  Resp: (!) 26 17 (!) 27 (!) 27  Temp:      TempSrc:      SpO2: 97% 96% (!) 87% 94%  Weight:  General obtunded with eyes open eyes:  PERRL, EOMI, normal lids ENT:  grossly normal hearing, lips & tongue, mmm Neck: Tracheostomy in place with no evidence of active bleeding around the tube. Cardiovascular:  RRR, no m/r/g. No LE edema.    Respiratory: CTA bilaterally, no w/r/r. Normal respiratory effort. Abdomen:  soft, ntnd, NABS PEG tube in place with no evidence of bleeding around the tube.  Skin:  no rash or induration seen on limited exam Musculoskeletal: Not moving her upper or lower extremities. Psychiatric: Unable to determine patient obtunded. Neurologic: Patient not moving any extremities remains obtunded with eyes open. Labs on Admission: I have personally reviewed following labs and imaging studies  CBC: Recent Labs  Lab 10/08/17 0353  WBC 18.2*  HGB 6.1*  HCT 19.0*  MCV 96.4  PLT 725   Basic Metabolic Panel: Recent Labs  Lab 10/08/17 0353  NA 140  K 5.1  CL 102  CO2 22  GLUCOSE 313*  BUN 92*  CREATININE 3.46*  CALCIUM 9.2   GFR: Estimated Creatinine Clearance: 18.3 mL/min (A) (by C-G formula based on SCr of 3.46 mg/dL (H)). Liver Function Tests: Recent Labs  Lab 10/08/17 0353  AST 35  ALT 26  ALKPHOS 45  BILITOT 0.5  PROT 8.0  ALBUMIN 2.6*   No results for input(s): LIPASE, AMYLASE in the last 168 hours. No results for input(s): AMMONIA in the last 168 hours. Coagulation Profile: Recent Labs  Lab 10/08/17 0353  INR 2.47   Cardiac Enzymes: No results for input(s): CKTOTAL, CKMB, CKMBINDEX, TROPONINI in the last 168 hours. BNP (last 3 results) No results for input(s): PROBNP in the last 8760 hours. HbA1C: No results for input(s): HGBA1C in the last 72 hours. CBG: Recent Labs  Lab 10/08/17 0356 10/08/17 0618  GLUCAP 294* 285*   Lipid Profile: No results for input(s): CHOL, HDL, LDLCALC, TRIG, CHOLHDL, LDLDIRECT in the last 72 hours. Thyroid Function Tests: No results for input(s): TSH, T4TOTAL, FREET4, T3FREE, THYROIDAB in the last 72 hours. Anemia Panel: No results for input(s): VITAMINB12, FOLATE, FERRITIN, TIBC, IRON, RETICCTPCT in the last 72 hours. Urine analysis:    Component Value Date/Time   COLORURINE YELLOW 08/05/2017 1216   APPEARANCEUR HAZY (A)  08/05/2017 1216   LABSPEC 1.017 08/05/2017 1216   PHURINE 5.0 08/05/2017 1216   GLUCOSEU NEGATIVE 08/05/2017 1216   GLUCOSEU > 1000 mg/dL (A) 11/01/2007 2056   HGBUR NEGATIVE 08/05/2017 1216   HGBUR negative 07/15/2007 0954   BILIRUBINUR NEGATIVE 08/05/2017 1216   KETONESUR NEGATIVE 08/05/2017 1216   PROTEINUR 30 (A) 08/05/2017 1216   UROBILINOGEN 1.0 04/09/2012 1524   NITRITE NEGATIVE 08/05/2017 1216   LEUKOCYTESUR TRACE (A) 08/05/2017 1216    Creatinine Clearance: Estimated Creatinine Clearance: 18.3 mL/min (A) (by C-G formula based on SCr of 3.46 mg/dL (H)).  Sepsis Labs: @LABRCNTIP (procalcitonin:4,lacticidven:4) )No results found for this or any previous visit (from the past 240 hour(s)).   Radiological Exams on Admission: Ct Abdomen Pelvis Wo Contrast  Result Date: 10/08/2017 CLINICAL DATA:  Hypotension, low hemoglobin, leukocytosis. EXAM: CT ABDOMEN AND PELVIS WITHOUT CONTRAST TECHNIQUE: Multidetector CT imaging of the abdomen and pelvis was performed following the standard protocol without IV contrast. COMPARISON:  08/15/2017. FINDINGS: Lower chest: Lung bases show mild volume loss in the right lower lobe. Heart is enlarged. Atherosclerotic calcification of the arterial vasculature, including coronary arteries. No pericardial or pleural effusion. Hepatobiliary: Image quality is degraded by streak artifact from external monitoring devices. Liver and gallbladder are grossly unremarkable.  No biliary ductal dilatation. Pancreas: Image quality is degraded by motion. Grossly unremarkable. Spleen: Grossly unremarkable. Adrenals/Urinary Tract: Image quality is degraded by motion. Adrenal glands and kidneys are grossly unremarkable. Renal vascular calcifications on the right. Bladder is grossly unremarkable. Stomach/Bowel: Percutaneous gastrostomy. Stomach, small bowel, appendix and colon are otherwise grossly unremarkable. Vascular/Lymphatic: Diffuse atherosclerotic calcification of the  arterial vasculature without abdominal aortic aneurysm. Scattered lymph nodes are not enlarged by CT size criteria. Reproductive: Uterus is visualized.  No adnexal mass. Other: No free fluid. There are rounded areas of hyper attenuation and stranding along the subcutaneous low right flank, as well as overlying the right iliac wing, some of which are partially imaged. These measure at least 2.7 x 3.0 cm. Musculoskeletal: Degenerative changes in the spine. No worrisome lytic or sclerotic lesions. IMPRESSION: 1. Probable subcutaneous hematomas in the low right flank and overlying the right iliac wing. Please correlate for a history of trauma. No evidence of internal hemorrhage in the abdomen or pelvis. 2. Image quality is degraded by motion and streak artifact. 3. Aortic atherosclerosis (ICD10-170.0). Coronary artery calcification. Electronically Signed   By: Lorin Picket M.D.   On: 10/08/2017 08:17   Dg Chest Port 1 View  Result Date: 10/08/2017 CLINICAL DATA:  62 year old female with shortness of breath. EXAM: PORTABLE CHEST 1 VIEW COMPARISON:  Chest radiograph dated 09/08/2017 FINDINGS: Tracheostomy above the carina. Bibasilar linear and streaky atelectatic changes with interval improvement compared to prior radiograph. There is no focal consolidation, pleural effusion, or pneumothorax. Mild cardiomegaly. No acute osseous pathology. IMPRESSION: 1. Tracheostomy above the carina. 2. Interval improvement of bibasilar atelectasis/scarring. Electronically Signed   By: Anner Crete M.D.   On: 10/08/2017 04:49    EKG: Independently reviewed.  Assessment/Plan Active Problems:   Symptomatic anemia   Sepsis ([place])  1] acute blood loss anemia-patient presented from a nursing home with a history of 200-250 cc of blood from the tracheostomy site.  She has not had any further bleeding from the tracheostomy site while she was in the ER.  Patient's hemoglobin was 6.1.  She does have a history of anemia of chronic  disease.  FOBT was negative in the ER.  A CT scan of the abdomen and pelvis showed subcutaneous hematoma at the right flank right pelvic area.  Patient is on Coumadin for a DVT that was diagnosed and February 2019 for a right upper extremity DVT where she had a PICC line.  At this time I will hold her Coumadin.  Patient got 1 unit of blood transfusion and FFP already.  2 more units of blood transfusion to be given.  It looks like the source of bleeding was subcutaneous flank area.  Her INR on presentation was 2.47.  Patient was hypotensive tachycardic and tachypneic in the ER.  This was corrected after she was given wide open IV fluids FFP and blood transfusion.  She was on Lovenox aspirin and Coumadin in the nursing home according to the nursing home [facility].   2] possible sepsis- patient presented with bleeding from the tracheostomy site, tachycardia tachypnea hypotension leukocytosis.  These were corrected after blood transfusion FFP and IV fluids.  Patient was given a dose of vancomycin and Zosyn in the ER.  At this time the source of infection is unclear.  Urine culture blood culture has been drawn in the ER.  Chest x-ray did not reveal any acute infiltrates.  3] hypotension-most likely cause his acute blood loss anemia though we have to rule out  sepsis.  Hypotension has been corrected with IV fluids blood transfusion and FFP.  She also received vancomycin and Zosyn.  With a history of multiple hospitalizations Pseudomonas pneumonia aspiration pneumonia recurrent UTI need to rule out sepsis.  Blood culture has been drawn in the ER.  A urine culture and a UA has been added.  I will hold Norvasc, Catapres, Vasotec, Lasix, hydralazine, labetalol.  She was on 6 antihypertensives in the nursing home.   4] acute on chronic kidney disease stage III-her creatinine is 3.46 on admission which is up from 1.30 beginning of March 2019.  Patient appears to be dehydrated and hypotensive which caused acute kidney injury.   Hopefully this will improve with fluid resuscitation and blood transfusion.  Stat BMP has been ordered and pending at this time.  Avoid any nephrotoxins.  I have not called a renal consult.  If renal functions worsen please consult nephrology.  5] seizure disorder patient has not had any seizures while she was in the emergency room.  She does take Vimpat 200 mg twice a day and Keppra thousand milligrams twice a day and Topamax 200 mg twice a day.  All these will be restarted.  Patient does have a history of status epilepticus.  6] history of encephalopathy and status epilepticus requiring trach and PEG-will feed her through the PEG tube.  Monitor for any recurrent bleeding from the tracheal site.  7] subcutaneous hematoma in the low right flank and right iliac wing measuring at least 2.7 x 3 patient's INR on presentation was 2.47 which has probably led to this bleeding in the right flank.  I will hold her Coumadin.  Coumadin was started on February 2019 for a right upper upper extremity DVT where she had a PICC line.  Considering her current clinical status it would be prudent to hold Coumadin as her DVT was secondary to PICC line.  8] multiple pressure ulcers prior to admission to the hospital-Will consult wound care continue Santyl for now.    DVT prophylaxis: SCD Code Status: Full code Family Communication: Discussed with Tiffany her daughter who lives in [place] at [phone].  I called her prior to getting CT scan report results back.  I did try to call her after I got the reports and it went her voicemail and I have left a voicemail to call me back. Disposition Plan: TBD Consults called: None at this time Admission status: Inpatient  Georgette Shell MD [facility]  If 7PM-7AM, please contact night-coverage [email or web address] Password [facility]  10/08/2017, 8:27 AM

## 2017-10-08 NOTE — Progress Notes (Signed)
Initial Nutrition Assessment  DOCUMENTATION CODES:   Obesity unspecified  INTERVENTION:  - Recommend changing Dilantin to IV rather than via PEG as TF will need to be off one hour before and one hour after administration of this medication.  - If Dilantin able to be switched to IV, recommend Jevity 1.2 @ 55 mL/hr with 30 mL Prostat TID.   - If Dilantin unable to be switched to IV, recommend Jevity 1.2 @ 70 mL/hr x20 hours with 30 mL Prostat BID. This regimen will provide 1880 kcal, 108 grams of protein, and 1130 mL free water.   *Noted hx of uncontrolled DM and current CBGs. Will need to trial standard TF formula (such as Jevity) prior to switching to a specialty TF (such as Glucerna) for insurance purposes.    NUTRITION DIAGNOSIS:   Inadequate oral intake related to inability to eat as evidenced by NPO status.  GOAL:   Patient will meet greater than or equal to 90% of their needs  MONITOR:   TF tolerance, Weight trends, Labs, Skin, I & O's  REASON FOR ASSESSMENT:   New TF  ASSESSMENT:   62 y.o. 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 She was brought in by EMS from nursing home with complaints of bleeding from the tracheostomy. No documented fever or chills, nausea, vomiting, diarrhea, or abdominal pain.  Patient has a trach and PEG. Patient has had multiple hospital admissions the last 6 months for diabetic ketoacidosis, acute encephalopathy and stroke, possible acute bacterial meningitis, status epilepticus, pneumonia and acute respiratory failure with hypoxemia.  According to the daughter, she used to speak but has not spoken for at least the past month.   Pt is NPO and is non-verbal with no family/visitors  present. Pt with trach and PEG. Review of medication history indicates that pt was receiving Jevity 1.2 at unknown rate with 30 mL Prostat TID at [facility].   Pt was last seen by an RD on 09/11/17 at which time pt was receiving Jevity 1.2 @ 55 ml/hr with 30 mL Prostat TID. This regimen provides 1884 kcal, 118 grams of protein, and 1065 mL free water   Per chart review, pt has ost 26 lbs (12.6% body weight) in the past 4 months. This is significant for time frame. Unable to state malnutrition based on ASPEN guidelines at this time but will continue to monitor.   Medications reviewed; sliding scale Novolog, 5 mL Mycostatin QID, 250 mg Dilantin per PEG BID.,  Labs reviewed; CBGs: 294 and 285 mg/dL this AM, BUN: 92 mg/dL, creatinine: 3.46 mg/dL, GFR: 16 mL/min.      NUTRITION - FOCUSED PHYSICAL EXAM:  Completed/assessed with no muscle and no fat wasting noted.   Diet Order:  Diet NPO time specified  EDUCATION NEEDS:   No education needs have been identified at this time  Skin:  Skin Assessment: Skin Integrity Issues: Skin Integrity Issues:: Unstageable Unstageable: coccyx (per [place] note 4/1)  Last BM:  PTA/unknown  Height:   Ht Readings from Last 1 Encounters:  08/02/17 5\' 5"  (1.651 m)    Weight:   Wt Readings from Last 1 Encounters:  10/08/17 181 lb (82.1 kg)    Ideal Body Weight:  56.82 kg  BMI:  Body mass index is 30.12 kg/m.  Estimated  Nutritional Needs:   Kcal:  1890-2050 (23-25 kcal/kg)  Protein:  100-115 grams (1.2-1.4 grams/kg)  Fluid:  >/= 1.8 L/day     Jarome Matin, MS, RD, LDN, [facility] Inpatient Clinical Dietitian Pager # [phone] After hours/weekend pager # [phone]

## 2017-10-08 NOTE — Plan of Care (Addendum)
Received signout from Dr. 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   Labs revealed WBC 18.2, hemoglobin 6.1, BUN 92, creatinine 3.46, glucose 313, and anion gap 16. Chest x-ray did not show any acute abnormalities.  Stool guaiac was negative.  Bleeding from trach was noted to be stopped.  Patient was typed and screened for possible need of blood products and given 1 L of normal saline IV fluids.  Bladder scan only showed 190 mL of urine present.  TRH called to admit for symptomatic anemia and AKI on CKD. Orders for patient be admitted to a stepdown bed, transfused 2 units of packed red blood cells, urinalysis, and IV protonix.  Subsequently, systolic blood pressures dropped into the 60s.  Ordered 2 L of normal saline IV fluids, 2 units of fresh frozen plasma to be given, and stat CT without contrast of abdomen pelvis for possible retroperitoneal bleed.  Pressures improved after 50 L of normal saline IV fluids and 1 unit of emergency release blood being given.

## 2017-10-09 LAB — CBC
HEMATOCRIT: 29.8 % — AB (ref 36.0–46.0)
Hemoglobin: 9.7 g/dL — ABNORMAL LOW (ref 12.0–15.0)
MCH: 29.3 pg (ref 26.0–34.0)
MCHC: 32.6 g/dL (ref 30.0–36.0)
MCV: 90 fL (ref 78.0–100.0)
Platelets: 265 10*3/uL (ref 150–400)
RBC: 3.31 MIL/uL — ABNORMAL LOW (ref 3.87–5.11)
RDW: 19.2 % — AB (ref 11.5–15.5)
WBC: 12.2 10*3/uL — ABNORMAL HIGH (ref 4.0–10.5)

## 2017-10-09 LAB — BPAM FFP
BLOOD PRODUCT EXPIRATION DATE: 201904062359
Blood Product Expiration Date: 201904062359
ISSUE DATE / TIME: 201904011229
ISSUE DATE / TIME: 201904011550
UNIT TYPE AND RH: 9500
Unit Type and Rh: 5100

## 2017-10-09 LAB — BASIC METABOLIC PANEL
ANION GAP: 13 (ref 5–15)
BUN: 217 mg/dL — AB (ref 6–20)
CHLORIDE: 121 mmol/L — AB (ref 101–111)
CO2: 20 mmol/L — AB (ref 22–32)
Calcium: 9 mg/dL (ref 8.9–10.3)
Creatinine, Ser: 2.17 mg/dL — ABNORMAL HIGH (ref 0.44–1.00)
GFR calc Af Amer: 27 mL/min — ABNORMAL LOW (ref >60)
GFR, EST NON AFRICAN AMERICAN: 24 mL/min — AB (ref >60)
Glucose, Bld: 274 mg/dL — ABNORMAL HIGH (ref 65–99)
Potassium: 4.1 mmol/L (ref 3.5–5.1)
SODIUM: 154 mmol/L — AB (ref 135–145)

## 2017-10-09 LAB — PREPARE FRESH FROZEN PLASMA
UNIT DIVISION: 0
UNIT DIVISION: 0

## 2017-10-09 LAB — PROTIME-INR
INR: 2.96
Prothrombin Time: 30.6 seconds — ABNORMAL HIGH (ref 11.4–15.2)

## 2017-10-09 LAB — GLUCOSE, CAPILLARY
GLUCOSE-CAPILLARY: 265 mg/dL — AB (ref 65–99)
GLUCOSE-CAPILLARY: 186 mg/dL — AB (ref 65–99)
Glucose-Capillary: 186 mg/dL — ABNORMAL HIGH (ref 65–99)
Glucose-Capillary: 187 mg/dL — ABNORMAL HIGH (ref 65–99)
Glucose-Capillary: 179 mg/dL — ABNORMAL HIGH (ref 65–99)

## 2017-10-09 LAB — VANCOMYCIN, RANDOM: Vancomycin Rm: 25

## 2017-10-09 MED ORDER — HYDRALAZINE HCL 20 MG/ML IJ SOLN
5.0000 mg | INTRAMUSCULAR | Status: DC | PRN
  Administered 2017-10-09 – 2017-10-15 (×8): 5 mg via INTRAVENOUS
  Filled 2017-10-09 (×8): qty 1

## 2017-10-09 MED ORDER — INSULIN GLARGINE 100 UNIT/ML ~~LOC~~ SOLN
15.0000 [IU] | Freq: Two times a day (BID) | SUBCUTANEOUS | Status: DC
  Administered 2017-10-09 – 2017-10-11 (×5): 15 [IU] via SUBCUTANEOUS
  Filled 2017-10-09 (×7): qty 0.15

## 2017-10-09 MED ORDER — INSULIN ASPART 100 UNIT/ML ~~LOC~~ SOLN
2.0000 [IU] | SUBCUTANEOUS | Status: DC
  Administered 2017-10-09 – 2017-10-18 (×53): 2 [IU] via SUBCUTANEOUS

## 2017-10-09 MED ORDER — HYDRALAZINE HCL 50 MG PO TABS: 100.0000 mg | ORAL_TABLET | Freq: Three times a day (TID) | ORAL | Status: DC

## 2017-10-09 MED ORDER — FREE WATER
200.0000 mL | Freq: Three times a day (TID) | Status: DC
  Administered 2017-10-09 (×3): 200 mL

## 2017-10-09 MED ORDER — INSULIN ASPART 100 UNIT/ML ~~LOC~~ SOLN
0.0000 [IU] | SUBCUTANEOUS | Status: DC
  Administered 2017-10-09 (×3): 2 [IU] via SUBCUTANEOUS
  Administered 2017-10-09: 5 [IU] via SUBCUTANEOUS
  Administered 2017-10-09: 2 [IU] via SUBCUTANEOUS
  Administered 2017-10-10: 3 [IU] via SUBCUTANEOUS
  Administered 2017-10-10: 2 [IU] via SUBCUTANEOUS
  Administered 2017-10-10: 1 [IU] via SUBCUTANEOUS
  Administered 2017-10-10 (×2): 3 [IU] via SUBCUTANEOUS
  Administered 2017-10-11: 5 [IU] via SUBCUTANEOUS
  Administered 2017-10-11: 7 [IU] via SUBCUTANEOUS
  Administered 2017-10-11: 9 [IU] via SUBCUTANEOUS
  Administered 2017-10-11 (×2): 5 [IU] via SUBCUTANEOUS

## 2017-10-09 MED ORDER — SODIUM CHLORIDE 0.9 % IV SOLN
1.0000 g | INTRAVENOUS | Status: DC
  Administered 2017-10-09: 1 g via INTRAVENOUS
  Filled 2017-10-09: qty 1
  Filled 2017-10-09: qty 10

## 2017-10-09 NOTE — Clinical Social Work Note (Signed)
Clinical Social Work Assessment  Patient Details  Name: [name] MRN: [id number] Date of Birth: June 19, 1956  Date of referral:  10/09/17               Reason for consult:  Facility Placement                Permission sought to share information with:    Permission granted to share information::     Name::        Agency::  [name]  Relationship::  Daughter   Contact Information:     Housing/Transportation Living arrangements for the past 2 months:  [place] of Information:  Medical Team, Adult Children, Facility Patient Interpreter Needed:  None Criminal Activity/Legal Involvement Pertinent to Current Situation/Hospitalization:  No - Comment as needed Significant Relationships:  Adult Children Lives with:  Facility Resident Do you feel safe going back to the place where you live?  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 )  Care giving concerns:   No concerns presented. Patient is long term care resident at [facility]. Per daughter the patient will return at discharge.     Social Worker assessment / plan:  CSW spoke to the pt. daughter on the phone briefly before she had to return to work. Daughter states the patient has been non verbal since January 2019. Patient is total care with a Trach and PEG tube placed.  Patient daughter lives in [place] and cannot be readily available but she can reached by phone after 4:30pm.  Plan: Return to SNF.   Employment status:  Disabled (Comment on whether or not currently receiving Disability) Insurance information:  Medicaid In [place] PT Recommendations:  No Follow Up Information / Referral to community resources:     Patient/Family's Response to care:  Unable to assess.   Patient/Family's Understanding of and Emotional Response to Diagnosis, Current Treatment, and Prognosis:  Unable to assess.   Emotional Assessment Appearance:  Appears  stated age Attitude/Demeanor/Rapport:  Unresponsive Affect (typically observed):  Unable to Assess Orientation:    Alcohol / Substance use:  Not Applicable Psych involvement (Current and /or in the community):  No (Comment)  Discharge Needs  Concerns to be addressed:  Discharge Planning Concerns Readmission within the last 30 days:  Yes Current discharge risk:  Other Barriers to Discharge:  Continued Medical Work up   [employer], LCSW 10/09/2017, 2:35 PM

## 2017-10-09 NOTE — Progress Notes (Signed)
RT called to bedside by RN for patient distress. Patient removed from ATC. Lavaged, and suctioned. Thick, tenacious mucus plug suctioned from patient trach. Patient Inner cannula cleaned as well. Patient now more comfortable and no distress noted. RT will continue to monitor patient.

## 2017-10-09 NOTE — Progress Notes (Addendum)
Paged MD about patient's inconsistent Bps in the low 99'H systolic and then upper 741'S.  Pt also noted to have maintained O2 sat's above 90's with no sign of respiratory distress but when suctioned pt with RT at bedside, obtained a silver dollar coin size mucus plug.  Pt's cannula cleaned out multiple times of thick pink tinged mucus.  CN notified of patient's respiratory issues along with MD. Will continue to monitor

## 2017-10-09 NOTE — Progress Notes (Addendum)
PROGRESS NOTE    [name]  ZWC:[id number] DOB: [birth year] DOA: 10/08/2017 PCP: Nolene Ebbs, MD     Brief Narrative:  [name] is a 62 y.o. 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 Reports that patient had 200-250cc of bleeding from the tracheostomy at the nursing home. Since the patient has come to the ER has been no further bleeding. Patient has had multiple hospital admissions the last 6 months for diabetic ketoacidosis, acute encephalopathy and stroke, possible acute bacterial meningitis, status epilepticus, Pseudomonas pneumonia and acute respiratory failure with hypoxemia.  According to the daughter, patient used to speak in the past but since January 2019, she has been not been able to speak but she lays in the bed and just watches TV. CT scan of the abdomen and pelvis that was done showed probable subcutaneous hematoma in the low right flank and overlying the right iliac wing measures approximately 2.7 x 3 cm. She was given blood transfusion, FFP with stabilization of blood pressure and hemoglobin.  She was also started on broad-spectrum antibiotics for UTI.  Assessment & Plan:   Active Problems:   Symptomatic anemia   Sepsis (HCC)  Acute blood loss anemia secondary to subcutaneous hematoma at the right flank right pelvic area -No further bleeding from trach site, FOBT negative  -Hold coumadin -Transfused 2 pRBC and FFP -Trend CBC   Sepsis secondary to E Coli UTI -Stop vanco/zosyn, start rocephin  -Urine culture sensitivity pending    Hypernatremia -Free water flushes thru PEG ordered -Trend BMP   HTN -Holding home meds Norvasc, Catapres, Vasotec, Hydralazine, Lasix, labetalol due to hypotension on admission. Resume slowly.  -Hydralazine IV Prn ordered   AKI on CKD 3 -Baseline Cr 1.30 beginning of March 2019 -Improving, trend labs   Seizure disorder -Continue Vimpat, Keppra, Topamax  -Seizure precaution   DM type 2 -Ha1c 8.3 -Lantus, Novolog. Dose adjusted per DM coordinator recommendations   Coccyx unstageable wound POA -Wound RN consulted   S/p trach, PEG -Trach team consult -TF per RD    DVT prophylaxis: SCD Code Status: Full Family Communication: No family at bedside Disposition Plan: Back to [facility] home when stable   Consultants:   None  Procedures:   None   Antimicrobials:  Anti-infectives (From admission, onward)   Start     Dose/Rate Route Frequency Ordered Stop   10/09/17 1400  cefTRIAXone (ROCEPHIN) 1 g in sodium chloride 0.9 % 100 mL IVPB     1 g 200 mL/hr over 30 Minutes Intravenous Every 24 hours 10/09/17 0726     10/08/17 1200  piperacillin-tazobactam (ZOSYN) IVPB 2.25 g  Status:  Discontinued     2.25 g 100 mL/hr over 30 Minutes Intravenous Every 6 hours 10/08/17 0646 10/09/17 0726   10/08/17 0700  vancomycin (VANCOCIN) 1,750 mg in sodium chloride 0.9 % 500 mL IVPB     1,750 mg 250 mL/hr over 120 Minutes Intravenous  Once 10/08/17 0645 10/08/17 1001   10/08/17 0630  piperacillin-tazobactam (ZOSYN) IVPB 3.375 g  Status:  Discontinued     3.375 g 100 mL/hr over 30 Minutes Intravenous  Once 10/08/17 0622 10/09/17  5400   10/08/17 0630  vancomycin (VANCOCIN) IVPB 1000 mg/200 mL premix  Status:  Discontinued     1,000 mg 200 mL/hr over 60 Minutes Intravenous  Once 10/08/17 0622 10/08/17 0645       Subjective: Nonverbal   Objective: Vitals:   10/09/17 0900 10/09/17 1000 10/09/17 1156 10/09/17 1200  BP: (!) 187/108 (!) 142/64 (!) 184/69   Pulse: (!) 103  98 (!) 104 (!) 102  Resp: (!) 22 (!) 30 (!) 54 (!) 24  Temp:    99.1 F (37.3 C)  TempSrc:    Axillary  SpO2: 97% 98% 100% 96%  Weight:        Intake/Output Summary (Last 24 hours) at 10/09/2017 1328 Last data filed at 10/09/2017 1123 Gross per 24 hour  Intake 511 ml  Output 3500 ml  Net -2989 ml   Filed Weights   10/08/17 0600 10/09/17 0320  Weight: 82.1 kg (181 lb) 80 kg (176 lb 5.9 oz)    Examination:  General exam: Appears calm  Respiratory system: Clear to auscultation. Respiratory effort normal. +Trach collar  Cardiovascular system: S1 & S2 heard, tachycardic regular rhythm. No JVD, murmurs, rubs, gallops or clicks. No pedal edema. Gastrointestinal system: Abdomen is nondistended, soft and nontender. No organomegaly or masses felt. Normal bowel sounds heard. +PEG Central nervous system: Alert  Extremities: Symmetric   Data Reviewed: I have personally reviewed following labs and imaging studies  CBC: Recent Labs  Lab 10/08/17 0353 10/08/17 1931 10/09/17 0350  WBC 18.2* 13.1* 12.2*  HGB 6.1* 9.4* 9.7*  HCT 19.0* 28.9* 29.8*  MCV 96.4 88.9 90.0  PLT 324 268 867   Basic Metabolic Panel: Recent Labs  Lab 10/08/17 0353 10/08/17 1931 10/09/17 0350  NA 140 148* 154*  K 5.1 4.2 4.1  CL 102 115* 121*  CO2 22 21* 20*  GLUCOSE 313* 267* 274*  BUN 92* 229* 217*  CREATININE 3.46* 2.39* 2.17*  CALCIUM 9.2 8.9 9.0   GFR: Estimated Creatinine Clearance: 28.8 mL/min (A) (by C-G formula based on SCr of 2.17 mg/dL (H)). Liver Function Tests: Recent Labs  Lab 10/08/17 0353  AST 35  ALT 26  ALKPHOS 45  BILITOT 0.5  PROT 8.0  ALBUMIN 2.6*   No results for input(s): LIPASE, AMYLASE in the last 168 hours. No results for input(s): AMMONIA in the last 168 hours. Coagulation Profile: Recent Labs  Lab 10/08/17 0353 10/08/17 1931 10/09/17 0350  INR 2.47 2.41 2.96   Cardiac Enzymes: No results for input(s): CKTOTAL, CKMB, CKMBINDEX, TROPONINI in the last 168  hours. BNP (last 3 results) No results for input(s): PROBNP in the last 8760 hours. HbA1C: Recent Labs    10/08/17 1931  HGBA1C 8.3*   CBG: Recent Labs  Lab 10/08/17 1116 10/08/17 1702 10/08/17 2102 10/09/17 0735 10/09/17 1124  GLUCAP 277* 235* 223* 265* 186*   Lipid Profile: No results for input(s): CHOL, HDL, LDLCALC, TRIG, CHOLHDL, LDLDIRECT in the last 72 hours. Thyroid Function Tests: No results for input(s): TSH, T4TOTAL, FREET4, T3FREE, THYROIDAB in the last 72 hours. Anemia Panel: No results for input(s): VITAMINB12, FOLATE, FERRITIN, TIBC, IRON, RETICCTPCT in the last 72 hours. Sepsis Labs: No results for input(s): PROCALCITON, LATICACIDVEN in the last 168 hours.  Recent Results (from the past 240 hour(s))  MRSA PCR Screening     Status: None   Collection Time: 10/08/17 11:36 AM  Result Value Ref Range Status   MRSA by PCR NEGATIVE NEGATIVE Final    Comment:  The GeneXpert MRSA Assay (FDA approved for NASAL specimens only), is one component of a comprehensive MRSA colonization surveillance program. It is not intended to diagnose MRSA infection nor to guide or monitor treatment for MRSA infections. Performed at [facility], [place] [address] [place], [place] [address]   Urine culture     Status: Abnormal (Preliminary result)   Collection Time: 10/08/17 11:43 AM  Result Value Ref Range Status   Specimen Description   Final    URINE, RANDOM Performed at [facility], [place] [address], [place], [place] [address]    Special Requests   Final    NONE Performed at [facility], [place] [address], [place], [place] [address]    Culture >=100,000 COLONIES/mL ESCHERICHIA COLI (A)  Final   Report Status PENDING  Incomplete       Radiology Studies: Ct Abdomen Pelvis Wo Contrast  Result Date: 10/08/2017 CLINICAL DATA:  Hypotension, low hemoglobin, leukocytosis. EXAM: CT ABDOMEN AND PELVIS WITHOUT  CONTRAST TECHNIQUE: Multidetector CT imaging of the abdomen and pelvis was performed following the standard protocol without IV contrast. COMPARISON:  08/15/2017. FINDINGS: Lower chest: Lung bases show mild volume loss in the right lower lobe. Heart is enlarged. Atherosclerotic calcification of the arterial vasculature, including coronary arteries. No pericardial or pleural effusion. Hepatobiliary: Image quality is degraded by streak artifact from external monitoring devices. Liver and gallbladder are grossly unremarkable. No biliary ductal dilatation. Pancreas: Image quality is degraded by motion. Grossly unremarkable. Spleen: Grossly unremarkable. Adrenals/Urinary Tract: Image quality is degraded by motion. Adrenal glands and kidneys are grossly unremarkable. Renal vascular calcifications on the right. Bladder is grossly unremarkable. Stomach/Bowel: Percutaneous gastrostomy. Stomach, small bowel, appendix and colon are otherwise grossly unremarkable. Vascular/Lymphatic: Diffuse atherosclerotic calcification of the arterial vasculature without abdominal aortic aneurysm. Scattered lymph nodes are not enlarged by CT size criteria. Reproductive: Uterus is visualized.  No adnexal mass. Other: No free fluid. There are rounded areas of hyper attenuation and stranding along the subcutaneous low right flank, as well as overlying the right iliac wing, some of which are partially imaged. These measure at least 2.7 x 3.0 cm. Musculoskeletal: Degenerative changes in the spine. No worrisome lytic or sclerotic lesions. IMPRESSION: 1. Probable subcutaneous hematomas in the low right flank and overlying the right iliac wing. Please correlate for a history of trauma. No evidence of internal hemorrhage in the abdomen or pelvis. 2. Image quality is degraded by motion and streak artifact. 3. Aortic atherosclerosis (ICD10-170.0). Coronary artery calcification. Electronically Signed   By: Lorin Picket M.D.   On: 10/08/2017 08:17    Dg Chest Port 1 View  Result Date: 10/08/2017 CLINICAL DATA:  62 year old female with shortness of breath. EXAM: PORTABLE CHEST 1 VIEW COMPARISON:  Chest radiograph dated 09/08/2017 FINDINGS: Tracheostomy above the carina. Bibasilar linear and streaky atelectatic changes with interval improvement compared to prior radiograph. There is no focal consolidation, pleural effusion, or pneumothorax. Mild cardiomegaly. No acute osseous pathology. IMPRESSION: 1. Tracheostomy above the carina. 2. Interval improvement of bibasilar atelectasis/scarring. Electronically Signed   By: Anner Crete M.D.   On: 10/08/2017 04:49      Scheduled Meds: . chlorhexidine  15 mL Mouth Rinse BID  . collagenase   Topical Daily  . feeding supplement (PRO-STAT SUGAR FREE 64)  30 mL Per Tube TID  . free water  200 mL Per Tube Q8H  . hydrALAZINE  100 mg Per Tube Q8H  . insulin aspart  0-9 Units Subcutaneous  Q4H  . insulin aspart  2 Units Subcutaneous Q4H  . insulin glargine  15 Units Subcutaneous BID  . lacosamide  200 mg Per Tube BID  . levETIRAcetam  1,000 mg Per Tube BID  . mouth rinse  15 mL Mouth Rinse q12n4p  . nystatin  5 mL Oral QID  . pantoprazole (PROTONIX) IV  40 mg Intravenous Q12H  . phenytoin  250 mg Per Tube BID  . topiramate  200 mg Per Tube BID   Continuous Infusions: . sodium chloride    . cefTRIAXone (ROCEPHIN)  IV       LOS: 1 day    Time spent: 30 minutes   Dessa Phi, DO [facility] [email or web address] Password [email or web address] 10/09/2017, 1:28 PM

## 2017-10-09 NOTE — Care Management Note (Signed)
Case Management Note  Patient Details  Name: [name] MRN: [id number] Date of Birth: 1955/09/24  Subjective/Objective: [age] y/o f admitted w/Sepsis. From SNF-[facility]-non verbal,trach/peg dependent. No PT cons needed. CSW following for return.                   Action/Plan:d/c SNF.   Expected Discharge Date:  (unknown)               Expected Discharge Plan:  Skilled Nursing Facility  In-House Referral:  Clinical Social Work  Discharge planning Services  CM Consult  Post Acute Care Choice:    Choice offered to:     DME Arranged:    DME Agency:     HH Arranged:    [place] Agency:     Status of Service:  In process, will continue to follow  If discussed at Long Length of Stay Meetings, dates discussed:    Additional Comments:  Dessa Phi, RN 10/09/2017, 1:18 PM

## 2017-10-10 LAB — CBC
HEMATOCRIT: 33.9 % — AB (ref 36.0–46.0)
HEMOGLOBIN: 10.7 g/dL — AB (ref 12.0–15.0)
MCH: 29.2 pg (ref 26.0–34.0)
MCHC: 31.6 g/dL (ref 30.0–36.0)
MCV: 92.6 fL (ref 78.0–100.0)
Platelets: 336 10*3/uL (ref 150–400)
RBC: 3.66 MIL/uL — ABNORMAL LOW (ref 3.87–5.11)
RDW: 19.3 % — AB (ref 11.5–15.5)
WBC: 15.5 10*3/uL — ABNORMAL HIGH (ref 4.0–10.5)

## 2017-10-10 LAB — BASIC METABOLIC PANEL
BUN: 208 mg/dL — AB (ref 6–20)
CALCIUM: 10 mg/dL (ref 8.9–10.3)
CO2: 19 mmol/L — AB (ref 22–32)
Creatinine, Ser: 1.83 mg/dL — ABNORMAL HIGH (ref 0.44–1.00)
GFR calc non Af Amer: 29 mL/min — ABNORMAL LOW (ref >60)
GFR, EST AFRICAN AMERICAN: 33 mL/min — AB (ref >60)
GLUCOSE: 243 mg/dL — AB (ref 65–99)
Potassium: 3.8 mmol/L (ref 3.5–5.1)
Sodium: 168 mmol/L (ref 135–145)

## 2017-10-10 LAB — GLUCOSE, CAPILLARY
GLUCOSE-CAPILLARY: 183 mg/dL — AB (ref 65–99)
GLUCOSE-CAPILLARY: 209 mg/dL — AB (ref 65–99)
GLUCOSE-CAPILLARY: 224 mg/dL — AB (ref 65–99)
Glucose-Capillary: 202 mg/dL — ABNORMAL HIGH (ref 65–99)
Glucose-Capillary: 150 mg/dL — ABNORMAL HIGH (ref 65–99)
Glucose-Capillary: 271 mg/dL — ABNORMAL HIGH (ref 65–99)

## 2017-10-10 MED ORDER — DEXTROSE 5 % IV SOLN
INTRAVENOUS | Status: DC
  Administered 2017-10-10 – 2017-10-11 (×2): via INTRAVENOUS

## 2017-10-10 MED ORDER — CEFTRIAXONE SODIUM 1 G IJ SOLR: 1.0000 g | INTRAMUSCULAR | Status: DC

## 2017-10-10 MED ORDER — PRO-STAT SUGAR FREE PO LIQD
30.0000 mL | Freq: Two times a day (BID) | ORAL | Status: DC
  Administered 2017-10-10 – 2017-10-18 (×16): 30 mL
  Filled 2017-10-10 (×16): qty 30

## 2017-10-10 MED ORDER — JEVITY 1.2 CAL PO LIQD
1000.0000 mL | ORAL | Status: DC
  Administered 2017-10-10: 1000 mL

## 2017-10-10 MED ORDER — DEXTROSE 5 % IV SOLN
INTRAVENOUS | Status: DC
  Administered 2017-10-10 – 2017-10-12 (×3): via INTRAVENOUS
  Filled 2017-10-10 (×6): qty 10

## 2017-10-10 MED ORDER — FREE WATER
300.0000 mL | Freq: Three times a day (TID) | Status: DC
  Administered 2017-10-10 (×2): 300 mL

## 2017-10-10 MED ORDER — FREE WATER
500.0000 mL | Freq: Three times a day (TID) | Status: DC
  Administered 2017-10-10 – 2017-10-11 (×2): 500 mL

## 2017-10-10 NOTE — Progress Notes (Signed)
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  Patient presented from nursing home with complaints of bleeding from tracheostomy site.  Patient had 200-250 cc of bleeding from tracheostomy at the nursing facility.  Upon ED evaluation bleeding has stopped.  CT scan of the abdomen and pelvis was done show probably subcutaneous hematoma in the low right flank and overlying the right iliac wing measuring approximately 2.7-3 cm.  Patient found to be slightly anemic and was admitted for further workup.  She was given FFP and transfused 2 PRBCs.  Also found to have UTI and she was started on broad-spectrum antibiotics.  Subjective: Patient seen and examined, patient nonverbal, does not follow simple commands.  No acute events overnight, remains afebrile.  Assessment & Plan: Acute blood loss anemia secondary to subcutaneous hematoma of the right flank/pelvic area No overt bleeding, FOBT negative Holding Coumadin She is a status post 2 units of PRBCs and 1 unit of FFP Trend CBC  Sepsis secondary to E. coli UTI Initially treated with vancomycin and Zosyn now being treated with Rocephin Urine culture shows E. coli and Klebsiella, MDR but sensitive to ceftriaxone.  Continue treatment for 7 days.   Hypernatremia Change antibiotic formulation to D5/water, increase free water through PEG tube Free water deficit about 7 L Start D5W at 75, NP at 1800 and every 12 hours.  Hypertension Home BP medications are on hold due to soft blood pressures on admission. Continue to hold BP medication as blood  pressure continues to be soft. Hydralazine IV as needed  AKI on CKD stage III Baseline creatinine 1.3 Likely dehydration, creatinine improving Continue IV fluids Continue to monitor  Seizure disorders Continue Vimpat, Keppra, Topamax CC projection  Status post trach/PEG Trach care per track team, continue feeding tubes per RD  Coccyx unstageable wound POA Wound care per RN protocol  DVT prophylaxis: SCDs Code Status: Full Code Family Communication: None at bedside  Disposition Plan: SNF when Na+ improve and Cr stable    Consultants:   None   Procedures:   None  Antimicrobials: Anti-infectives (From admission, onward)   Start     Dose/Rate Route Frequency Ordered Stop   10/10/17 1400  cefTRIAXone (ROCEPHIN) 1 g in dextrose 5 % 50 mL injection     100 mL/hr over 30 Minutes Intravenous Every 24 hours 10/10/17 1041     10/10/17 1045  cefTRIAXone (ROCEPHIN) 1 g in sodium chloride 0.9 % 100 mL IVPB  Status:  Discontinued     1 g 200 mL/hr over 30 Minutes Intravenous Every 24 hours 10/10/17 1039 10/10/17 1041   10/09/17 1400  cefTRIAXone (ROCEPHIN) 1 g in sodium chloride 0.9 % 100 mL IVPB  Status:  Discontinued     1 g 200 mL/hr over 30 Minutes Intravenous Every 24 hours 10/09/17 0726 10/10/17 1039   10/08/17 1200  piperacillin-tazobactam (ZOSYN) IVPB 2.25 g  Status:  Discontinued     2.25 g 100 mL/hr over 30 Minutes Intravenous Every 6 hours 10/08/17 0646 10/09/17 0726   10/08/17 0700  vancomycin (VANCOCIN) 1,750 mg in sodium chloride 0.9 % 500 mL IVPB     1,750 mg 250  mL/hr over 120 Minutes Intravenous  Once 10/08/17 0645 10/08/17 1001   10/08/17 0630  piperacillin-tazobactam (ZOSYN) IVPB 3.375 g  Status:  Discontinued     3.375 g 100 mL/hr over 30 Minutes Intravenous  Once 10/08/17 0622 10/09/17 0731   10/08/17 0630  vancomycin (VANCOCIN) IVPB 1000 mg/200 mL premix  Status:  Discontinued     1,000 mg 200 mL/hr over 60 Minutes Intravenous  Once 10/08/17 0622  10/08/17 0645        Objective: Vitals:   10/10/17 0400 10/10/17 0635 10/10/17 0700 10/10/17 0817  BP: (!) 102/29 (!) 188/46 (!) 107/42 (!) 121/41  Pulse: (!) 110  (!) 102 (!) 112  Resp: (!) 28  (!) 27 (!) 31  Temp:    98.6 F (37 C)  TempSrc:    Axillary  SpO2: 100%  100% 99%  Weight:        Intake/Output Summary (Last 24 hours) at 10/10/2017 0934 Last data filed at 10/10/2017 0700 Gross per 24 hour  Intake 960 ml  Output 3100 ml  Net -2140 ml   Filed Weights   10/08/17 0600 10/09/17 0320  Weight: 82.1 kg (181 lb) 80 kg (176 lb 5.9 oz)    Examination:  General exam: Appears calm and comfortable   HEENT: Trach collar in place Respiratory system: Vesicular sounds, no wheezing or crackles Cardiovascular system: S1 & S2 heard, RRR. No JVD, murmurs, rubs or gallops Gastrointestinal system: Abdomen is nondistended, soft and nontender.  PEG tube in place Central nervous system: Alert, nonverbal Extremities: No pedal edema. Skin: No rashes  Data Reviewed: I have personally reviewed following labs and imaging studies  CBC: Recent Labs  Lab 10/08/17 0353 10/08/17 1931 10/09/17 0350 10/10/17 0322  WBC 18.2* 13.1* 12.2* 15.5*  HGB 6.1* 9.4* 9.7* 10.7*  HCT 19.0* 28.9* 29.8* 33.9*  MCV 96.4 88.9 90.0 92.6  PLT 324 268 265 035   Basic Metabolic Panel: Recent Labs  Lab 10/08/17 0353 10/08/17 1931 10/09/17 0350 10/10/17 0322  NA 140 148* 154* 168*  K 5.1 4.2 4.1 3.8  CL 102 115* 121* >130*  CO2 22 21* 20* 19*  GLUCOSE 313* 267* 274* 243*  BUN 92* 229* 217* 208*  CREATININE 3.46* 2.39* 2.17* 1.83*  CALCIUM 9.2 8.9 9.0 10.0   GFR: Estimated Creatinine Clearance: 34.2 mL/min (A) (by C-G formula based on SCr of 1.83 mg/dL (H)). Liver Function Tests: Recent Labs  Lab 10/08/17 0353  AST 35  ALT 26  ALKPHOS 45  BILITOT 0.5  PROT 8.0  ALBUMIN 2.6*   No results for input(s): LIPASE, AMYLASE in the last 168 hours. No results for input(s): AMMONIA in the last  168 hours. Coagulation Profile: Recent Labs  Lab 10/08/17 0353 10/08/17 1931 10/09/17 0350  INR 2.47 2.41 2.96   Cardiac Enzymes: No results for input(s): CKTOTAL, CKMB, CKMBINDEX, TROPONINI in the last 168 hours. BNP (last 3 results) No results for input(s): PROBNP in the last 8760 hours. HbA1C: Recent Labs    10/08/17 1931  HGBA1C 8.3*   CBG: Recent Labs  Lab 10/09/17 1511 10/09/17 1953 10/09/17 2325 10/10/17 0308 10/10/17 0812  GLUCAP 186* 187* 179* 202* 150*   Lipid Profile: No results for input(s): CHOL, HDL, LDLCALC, TRIG, CHOLHDL, LDLDIRECT in the last 72 hours. Thyroid Function Tests: No results for input(s): TSH, T4TOTAL, FREET4, T3FREE, THYROIDAB in the last 72 hours. Anemia Panel: No results for input(s): VITAMINB12, FOLATE, FERRITIN, TIBC, IRON, RETICCTPCT in the last 72 hours. Sepsis Labs:  No results for input(s): PROCALCITON, LATICACIDVEN in the last 168 hours.  Recent Results (from the past 240 hour(s))  MRSA PCR Screening     Status: None   Collection Time: 10/08/17 11:36 AM  Result Value Ref Range Status   MRSA by PCR NEGATIVE NEGATIVE Final    Comment:        The GeneXpert MRSA Assay (FDA approved for NASAL specimens only), is one component of a comprehensive MRSA colonization surveillance program. It is not intended to diagnose MRSA infection nor to guide or monitor treatment for MRSA infections. Performed at [facility], [place] [address] [place], [place] [address]   Urine culture     Status: Abnormal (Preliminary result)   Collection Time: 10/08/17 11:43 AM  Result Value Ref Range Status   Specimen Description   Final    URINE, RANDOM Performed at [facility], [place] [address], [place], [place] [address]    Special Requests   Final    NONE Performed at [facility], [place] [address] [place], [place] [address]    Culture (A)  Final    >=100,000 COLONIES/mL ESCHERICHIA  COLI >=100,000 COLONIES/mL GRAM NEGATIVE RODS IDENTIFICATION AND SUSCEPTIBILITIES TO FOLLOW Performed at [facility], [place] [address], [place], [place] [address]    Report Status PENDING  Incomplete   Organism ID, Bacteria ESCHERICHIA COLI (A)  Final      Susceptibility   Escherichia coli - MIC*    AMPICILLIN >=32 RESISTANT Resistant     CEFAZOLIN 16 SENSITIVE Sensitive     CEFTRIAXONE <=1 SENSITIVE Sensitive     CIPROFLOXACIN >=4 RESISTANT Resistant     GENTAMICIN <=1 SENSITIVE Sensitive     IMIPENEM <=0.25 SENSITIVE Sensitive     NITROFURANTOIN <=16 SENSITIVE Sensitive     TRIMETH/SULFA <=20 SENSITIVE Sensitive     AMPICILLIN/SULBACTAM >=32 RESISTANT Resistant     PIP/TAZO 8 SENSITIVE Sensitive     Extended ESBL NEGATIVE Sensitive     * >=100,000 COLONIES/mL ESCHERICHIA COLI      Radiology Studies: No results found.    Scheduled Meds: . chlorhexidine  15 mL Mouth Rinse BID  . collagenase   Topical Daily  . feeding supplement (PRO-STAT SUGAR FREE 64)  30 mL Per Tube TID  . free water  300 mL Per Tube Q8H  . insulin aspart  0-9 Units Subcutaneous Q4H  . insulin aspart  2 Units Subcutaneous Q4H  . insulin glargine  15 Units Subcutaneous BID  . lacosamide  200 mg Per Tube BID  . levETIRAcetam  1,000 mg Per Tube BID  . mouth rinse  15 mL Mouth Rinse q12n4p  . nystatin  5 mL Oral QID  . pantoprazole (PROTONIX) IV  40 mg Intravenous Q12H  . phenytoin  250 mg Per Tube BID  . topiramate  200 mg Per Tube BID   Continuous Infusions: . sodium chloride    . cefTRIAXone (ROCEPHIN)  IV Stopped (10/09/17 1358)     LOS: 2 days    Time spent: Total of 35 minutes spent with pt, greater than 50% of which was spent in discussion of  treatment, counseling and coordination of care   Chipper Oman, MD Pager: Text Page via [email or web address]   If 7PM-7AM, please contact night-coverage [email or web address] 10/10/2017, 9:34 AM   Note - This record has been created using [employer].  Chart creation errors have been sought, but may not always have been located. Such creation errors do  not reflect on the standard of medical care.

## 2017-10-10 NOTE — Progress Notes (Signed)
CRITICAL VALUE ALERT  Critical Value:  NA168, CL >130, and BUN elevated  Date & Time Notied:  10/10/2017  At 0446am  Provider Notified: yes  Orders Received/Actions taken: yes

## 2017-10-10 NOTE — Progress Notes (Signed)
Inpatient Diabetes Program Recommendations  AACE/ADA: New Consensus Statement on Inpatient Glycemic Control (2015)  Target Ranges:  Prepandial:   less than 140 mg/dL      Peak postprandial:   less than 180 mg/dL (1-2 hours)      Critically ill patients:  140 - 180 mg/dL   Lab Results  Component Value Date   GLUCAP 150 (H) 10/10/2017   HGBA1C 8.3 (H) 10/08/2017    Review of Glycemic Control  Blood sugars > goal of 180 mg/dL.   Inpatient Diabetes Program Recommendations:   Increase Novolog to 3 units Q4H Will need to be discharged on TF coverage (Novolog 3 units Q4H)  Continue to follow.  Thank you. Lorenda Peck, RD, LDN, CDE Inpatient Diabetes Coordinator [phone]

## 2017-10-10 NOTE — Progress Notes (Signed)
Nutrition Follow-up  DOCUMENTATION CODES:   Obesity unspecified  INTERVENTION:  - Will order Jevity 1.2 @ 70 mL/hr x20 hours with 30 mL Prostat BID. This regimen will provide 1880 kcal, 108 grams of protein, and 1130 mL free water. - Free water flush to continue to be per MD given severe hypernatremia and hyperchloridemia.   *Noted hx of uncontrolled DM and current CBGs. Will need to trial standard TF formula (such as Jevity) prior to switching to a specialty TF (such as Glucerna) for insurance purposes.     NUTRITION DIAGNOSIS:   Inadequate oral intake related to inability to eat as evidenced by NPO status. -ongoing  GOAL:   Patient will meet greater than or equal to 90% of their needs -unmet with TF not yet initiated.   MONITOR:   TF tolerance, Weight trends, Labs, Skin, I & O's  REASON FOR ASSESSMENT:   Consult Enteral/tube feeding initiation and management  ASSESSMENT:   62 y.o. 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 She was brought in by EMS from nursing home with complaints of bleeding from the tracheostomy. No documented fever or chills, nausea, vomiting, diarrhea, or abdominal pain.  Patient has a trach and PEG. Patient has had multiple hospital admissions the last 6 months for diabetic ketoacidosis, acute encephalopathy and stroke, possible acute bacterial meningitis, status epilepticus, pneumonia and acute respiratory failure with hypoxemia.  According to the daughter, she used to speak but has not spoken for at least the past month.   Weight -4 lbs/2.1 kg from from 4/1-4/2. Pt with PEG and trach. Reviewed Dr. Jeannine Kitten note from yesterday which indicates TF to be per RD. Called Dr. Quincy Simmonds and received  verbal consult for TF initiation and management.   Will order TF as outlined above. Plan for Dilantin to remain via PEG so will adjust TF order to account for this. Noted Na and Cl; order placed per MD for 300 mL free water TID (900 mL/day) today.   Medications reviewed; sliding scale Novolog, 2 units Novolog every 4 hours, 15 mL Lantus BID, 5 mL Mycostatin QID, 40 mg IV Protonix BID, 250 mg Dilantin per PEG BID.  Labs reviewed; CBGs: 202, 150, and 183 mg/dL today, Na: 168 mmol/L, Cl: >130 mmol/L, BUN: 208 mg/dL, creatinine: 1.83 mg/dL, GFR: 33 mL/min.     Diet Order:  Diet NPO time specified Seizure precautions  EDUCATION NEEDS:   No education needs have been identified at this time  Skin:  Skin Assessment: Skin Integrity Issues: Skin Integrity Issues:: Other (Comment) Unstageable: coccyx Other: R buttocks and R pretibial wound  Last BM:  4/1  Height:   Ht Readings from Last 1 Encounters:  08/02/17 5\' 5"  (1.651 m)    Weight:   Wt Readings from Last 1 Encounters:  10/09/17 176 lb 5.9 oz (80 kg)    Ideal Body Weight:  56.82 kg  BMI:  Body mass index is 29.35 kg/m.  Estimated Nutritional Needs:   Kcal:  1890-2050 (23-25 kcal/kg)  Protein:  100-115 grams (1.2-1.4 grams/kg)  Fluid:  >/= 1.8 L/day      Jarome Matin, MS, RD, LDN, [facility] Inpatient Clinical Dietitian Pager # [phone] After hours/weekend pager # [phone]

## 2017-10-11 ENCOUNTER — Inpatient Hospital Stay (HOSPITAL_COMMUNITY): Payer: Medicaid Other

## 2017-10-11 LAB — BASIC METABOLIC PANEL
BUN: 176 mg/dL — ABNORMAL HIGH (ref 6–20)
BUN: 174 mg/dL — AB (ref 6–20)
BUN: 158 mg/dL — ABNORMAL HIGH (ref 6–20)
CO2: 16 mmol/L — AB (ref 22–32)
CO2: 17 mmol/L — ABNORMAL LOW (ref 22–32)
CO2: 17 mmol/L — ABNORMAL LOW (ref 22–32)
Calcium: 9.8 mg/dL (ref 8.9–10.3)
Calcium: 9.9 mg/dL (ref 8.9–10.3)
Calcium: 9.4 mg/dL (ref 8.9–10.3)
Chloride: >130 mmol/L (ref 101–111)
Creatinine, Ser: 1.93 mg/dL — ABNORMAL HIGH (ref 0.44–1.00)
Creatinine, Ser: 1.83 mg/dL — ABNORMAL HIGH (ref 0.44–1.00)
Creatinine, Ser: 2.18 mg/dL — ABNORMAL HIGH (ref 0.44–1.00)
GFR calc Af Amer: 31 mL/min — ABNORMAL LOW (ref >60)
GFR calc non Af Amer: 27 mL/min — ABNORMAL LOW (ref >60)
GFR calc non Af Amer: 29 mL/min — ABNORMAL LOW (ref >60)
GFR, EST AFRICAN AMERICAN: 33 mL/min — AB (ref >60)
GFR, EST AFRICAN AMERICAN: 27 mL/min — AB (ref >60)
GFR, EST NON AFRICAN AMERICAN: 23 mL/min — AB (ref >60)
Glucose, Bld: 370 mg/dL — ABNORMAL HIGH (ref 65–99)
Glucose, Bld: 363 mg/dL — ABNORMAL HIGH (ref 65–99)
Glucose, Bld: 533 mg/dL (ref 65–99)
POTASSIUM: 3.6 mmol/L (ref 3.5–5.1)
POTASSIUM: 3.6 mmol/L (ref 3.5–5.1)
POTASSIUM: 3.6 mmol/L (ref 3.5–5.1)
SODIUM: 174 mmol/L — AB (ref 135–145)
SODIUM: 165 mmol/L — AB (ref 135–145)
Sodium: 171 mmol/L (ref 135–145)

## 2017-10-11 LAB — PROTIME-INR
INR: 3.25
PROTHROMBIN TIME: 32.9 s — AB (ref 11.4–15.2)

## 2017-10-11 LAB — GLUCOSE, CAPILLARY
GLUCOSE-CAPILLARY: 259 mg/dL — AB (ref 65–99)
GLUCOSE-CAPILLARY: 378 mg/dL — AB (ref 65–99)
Glucose-Capillary: 332 mg/dL — ABNORMAL HIGH (ref 65–99)
Glucose-Capillary: 283 mg/dL — ABNORMAL HIGH (ref 65–99)
Glucose-Capillary: 365 mg/dL — ABNORMAL HIGH (ref 65–99)
Glucose-Capillary: 435 mg/dL — ABNORMAL HIGH (ref 65–99)
Glucose-Capillary: 413 mg/dL — ABNORMAL HIGH (ref 65–99)

## 2017-10-11 LAB — CBC WITH DIFFERENTIAL/PLATELET
BASOS ABS: 0 10*3/uL (ref 0.0–0.1)
BASOS PCT: 0 %
Eosinophils Absolute: 0.2 10*3/uL (ref 0.0–0.7)
Eosinophils Relative: 1 %
HEMATOCRIT: 36.1 % (ref 36.0–46.0)
HEMOGLOBIN: 11.1 g/dL — AB (ref 12.0–15.0)
LYMPHS PCT: 19 %
Lymphs Abs: 3.5 10*3/uL (ref 0.7–4.0)
MCH: 29.7 pg (ref 26.0–34.0)
MCHC: 30.7 g/dL (ref 30.0–36.0)
MCV: 96.5 fL (ref 78.0–100.0)
MONOS PCT: 4 %
Monocytes Absolute: 0.7 10*3/uL (ref 0.1–1.0)
NEUTROS ABS: 14.1 10*3/uL — AB (ref 1.7–7.7)
Neutrophils Relative %: 76 %
Platelets: 312 10*3/uL (ref 150–400)
RBC: 3.74 MIL/uL — ABNORMAL LOW (ref 3.87–5.11)
RDW: 20 % — ABNORMAL HIGH (ref 11.5–15.5)
WBC: 18.5 10*3/uL — ABNORMAL HIGH (ref 4.0–10.5)

## 2017-10-11 LAB — URINE CULTURE: Culture: >=100000 — AB

## 2017-10-11 MED ORDER — ENALAPRIL MALEATE 10 MG PO TABS
30.0000 mg | ORAL_TABLET | Freq: Every day | ORAL | Status: DC
  Filled 2017-10-11: qty 3

## 2017-10-11 MED ORDER — GLUCERNA 1.2 CAL PO LIQD
1000.0000 mL | ORAL | Status: DC
  Administered 2017-10-11 – 2017-10-18 (×6): 1000 mL
  Filled 2017-10-11 (×15): qty 1000

## 2017-10-11 MED ORDER — INSULIN ASPART 100 UNIT/ML ~~LOC~~ SOLN
0.0000 [IU] | SUBCUTANEOUS | Status: DC
  Administered 2017-10-11 (×2): 15 [IU] via SUBCUTANEOUS
  Administered 2017-10-12: 5 [IU] via SUBCUTANEOUS
  Administered 2017-10-12: 8 [IU] via SUBCUTANEOUS
  Administered 2017-10-12: 5 [IU] via SUBCUTANEOUS
  Administered 2017-10-12: 8 [IU] via SUBCUTANEOUS
  Administered 2017-10-12: 3 [IU] via SUBCUTANEOUS
  Administered 2017-10-12: 8 [IU] via SUBCUTANEOUS
  Administered 2017-10-13 (×2): 5 [IU] via SUBCUTANEOUS
  Administered 2017-10-13: 2 [IU] via SUBCUTANEOUS
  Administered 2017-10-13 – 2017-10-14 (×2): 5 [IU] via SUBCUTANEOUS
  Administered 2017-10-14 (×2): 2 [IU] via SUBCUTANEOUS
  Administered 2017-10-14 (×2): 3 [IU] via SUBCUTANEOUS
  Administered 2017-10-15: 2 [IU] via SUBCUTANEOUS
  Administered 2017-10-16: 3 [IU] via SUBCUTANEOUS
  Administered 2017-10-16: 5 [IU] via SUBCUTANEOUS
  Administered 2017-10-16 (×2): 3 [IU] via SUBCUTANEOUS
  Administered 2017-10-16: 5 [IU] via SUBCUTANEOUS
  Administered 2017-10-17: 2 [IU] via SUBCUTANEOUS
  Administered 2017-10-17 – 2017-10-18 (×4): 3 [IU] via SUBCUTANEOUS
  Administered 2017-10-18 (×2): 2 [IU] via SUBCUTANEOUS

## 2017-10-11 MED ORDER — FREE WATER
500.0000 mL | Freq: Four times a day (QID) | Status: DC
  Administered 2017-10-11 – 2017-10-12 (×4): 500 mL

## 2017-10-11 MED ORDER — LABETALOL HCL 200 MG PO TABS
400.0000 mg | ORAL_TABLET | Freq: Three times a day (TID) | ORAL | Status: DC
  Administered 2017-10-11 – 2017-10-18 (×22): 400 mg
  Filled 2017-10-11 (×23): qty 2

## 2017-10-11 MED ORDER — CLONIDINE HCL 0.2 MG PO TABS
0.2000 mg | ORAL_TABLET | Freq: Three times a day (TID) | ORAL | Status: DC
  Administered 2017-10-11 – 2017-10-18 (×22): 0.2 mg
  Filled 2017-10-11 (×5): qty 2
  Filled 2017-10-11: qty 1
  Filled 2017-10-11 (×2): qty 2
  Filled 2017-10-11: qty 1
  Filled 2017-10-11: qty 2
  Filled 2017-10-11 (×7): qty 1
  Filled 2017-10-11 (×6): qty 2

## 2017-10-11 MED ORDER — INSULIN GLARGINE 100 UNIT/ML ~~LOC~~ SOLN
40.0000 [IU] | Freq: Two times a day (BID) | SUBCUTANEOUS | Status: DC
  Administered 2017-10-11 – 2017-10-18 (×14): 40 [IU] via SUBCUTANEOUS
  Filled 2017-10-11 (×17): qty 0.4

## 2017-10-11 MED ORDER — DEXTROSE 5 % IV SOLN
INTRAVENOUS | Status: DC
  Administered 2017-10-11 – 2017-10-12 (×4): via INTRAVENOUS

## 2017-10-11 MED ORDER — WARFARIN - PHARMACIST DOSING INPATIENT: Freq: Every day | Status: DC

## 2017-10-11 NOTE — Progress Notes (Signed)
CRITICAL VALUE ALERT  Critical Value:  Na 174 and Chloride >130  Date & Time Notied:  10/11/17  @ 1345  Provider Notified: Dr Quincy Simmonds  Orders Received/Actions taken: New orders placed

## 2017-10-11 NOTE — Plan of Care (Signed)
  Problem: Nutrition: Goal: Adequate nutrition will be maintained Outcome: Progressing   Problem: Elimination: Goal: Will not experience complications related to bowel motility Outcome: Progressing   Problem: Safety: Goal: Ability to remain free from injury will improve Outcome: Progressing   

## 2017-10-11 NOTE — Progress Notes (Signed)
Nutrition Follow-up  DOCUMENTATION CODES:   Obesity unspecified  INTERVENTION:  - Will change TF regimen: Glucerna 1.2 @ 70 mL/hr x20 hours with 30 mL Prostat BID. - This regimen will provide 1880 kcal, 114 grams of protein, 1569 mg sodium, and 1127 mL free water.  - Free water flush to continue to be per MD given severe/worsening hypernatremia and hyperchloridemia.   NUTRITION DIAGNOSIS:   Inadequate oral intake related to inability to eat as evidenced by NPO status. -ongoing  GOAL:   Patient will meet greater than or equal to 90% of their needs -met with TF regimen  MONITOR:   TF tolerance, Weight trends, Labs, Skin, I & O's  ASSESSMENT:   62 y.o. 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 She was brought in by EMS from nursing home with complaints of bleeding from the tracheostomy. No documented fever or chills, nausea, vomiting, diarrhea, or abdominal pain.  Patient has a trach and PEG. Patient has had multiple hospital admissions the last 6 months for diabetic ketoacidosis, acute encephalopathy and stroke, possible acute bacterial meningitis, status epilepticus, pneumonia and acute respiratory failure with hypoxemia.  According to the daughter, she used to speak but has not spoken for at least the past month.   Pt with trach and PEG. Noted serum Na continues to trend up and noted uncontrolled CBGs on current TF formula (Jevity 1.2) which is a standard TF formula. Because of this, sliding scale as well as scheduled insulin is needed. Will trial specialty TF formula (Glucerna 1.2) and monitor CBGs with this switch.  Pt currently ordered to receive Jevity 1.2 @ 70 mL/hr x20 hours with 30 mL Prostat BID and free water  flush increased yesterday at 10:00 PM to 500 mL TID. This regimen is providing 1880 kcal, 108 grams of protein, 1892 mg sodium, and 2630 mL free water.  Medications reviewed; sliding scale Novolog, 2 units Novolog every 4 hours, 15 units Lantus BID, 5 mL Mycostatin QID, 40 mg IV Protonix BID, 250 mg Dilantin per PEG BID.  Labs reviewed; CBGs: 332 and 283 mg/dL today, Na: 171 mmol/L, Cl: >130 mmol/L, BUN: 176 mg/dL, creatinine: 1.93 mg/dL, GFR: 31 mL/min.      Diet Order:  Diet NPO time specified Seizure precautions  EDUCATION NEEDS:   No education needs have been identified at this time  Skin:  Skin Assessment: Skin Integrity Issues: Skin Integrity Issues:: Other (Comment) Unstageable: coccyx Other: R buttocks and R pretibial wound  Last BM:  4/4  Height:   Ht Readings from Last 1 Encounters:  08/02/17 '5\' 5"'$  (1.651 m)    Weight:   Wt Readings from Last 1 Encounters:  10/10/17 167 lb 5.3 oz (75.9 kg)    Ideal Body Weight:  56.82 kg  BMI:  Body mass index is 27.85 kg/m.  Estimated Nutritional Needs:   Kcal:  1890-2050 (23-25 kcal/kg)  Protein:  100-115 grams (1.2-1.4 grams/kg)  Fluid:  >/= 1.8 L/day      Jarome Matin, MS, RD, LDN, [facility] Inpatient Clinical Dietitian Pager # [phone] After hours/weekend pager # [phone]

## 2017-10-11 NOTE — Progress Notes (Signed)
ANTICOAGULATION CONSULT NOTE - follow up  Pharmacy Consult for warfarin Indication: h/o DVT  No Known Allergies  Patient Measurements: Weight: 167 lb 5.3 oz (75.9 kg) Heparin Dosing Weight:   Vital Signs: Temp: 98.6 F (37 C) (04/04 1200) Temp Source: Oral (04/04 1200) BP: 130/79 (04/04 1400) Pulse Rate: 93 (04/04 1400)  Labs: Recent Labs    10/08/17 1931 10/09/17 0350 10/10/17 0322 10/11/17 0757 10/11/17 1256 10/11/17 1504  HGB 9.4* 9.7* 10.7* 11.1*  --   --   HCT 28.9* 29.8* 33.9* 36.1  --   --   PLT 268 265 336 312  --   --   LABPROT 26.0* 30.6*  --   --   --  32.9*  INR 2.41 2.96  --   --   --  3.25  CREATININE 2.39* 2.17* 1.83* 1.93* 1.83*  --     Estimated Creatinine Clearance: 33.3 mL/min (A) (by C-G formula based on SCr of 1.83 mg/dL (H)).   Medical History: Past Medical History:  Diagnosis Date  . Acute deep vein thrombosis (DVT) of right upper extremity ([place]) 08/30/2017  . Chronic diastolic CHF (congestive heart failure) ([place]) 08/30/2017  . Cocaine abuse, continuous ([place])   . Diabetes mellitus    Hb A1C = 12.6 on 05/15/11, managed on Novolog 70/30, 35 U qam, 25 U qpm  . Hypertension    poorly controlled  . Insomnia disorder   . Shortness of breath   . Stroke [facility])     Assessment: 84 YOF presenting 4/1 with bleeding from tracheostomy site.  She was also found to have subcutaneous hematoma of R flank. She is on warfarin for history of DVT. She is on warfarin 8mg  PO daily prior to admission. Her last dose of warfarin was 3/31. Warfarin has been held since admission but Hgb/Hct remains stable, so pharmacy asked to resume.  Today, 10/11/2017  INR now supratherapeutic and continues to rise despite no warfarin doses given  CBC: Hgb improved following PRBC 4/1, pltc WNL  No known active bleeding  Goal of Therapy:  INR 2-3, prefer 2-2.5 with recent bleeding events  Plan:   No warfarin today due to INR > 3  Daily INR  Monitor for bleeding  Adrian Saran, PharmD, BCPS Pager [phone] 10/11/2017 3:58 PM

## 2017-10-11 NOTE — Progress Notes (Signed)
CRITICAL VALUE ALERT  Critical Value:  Na 171 and Chloride >130  Date & Time Notied:  10/11/17  0855  Provider Notified: Quincy Simmonds  Orders Received/Actions taken: waiting for response

## 2017-10-11 NOTE — Progress Notes (Signed)
ANTICOAGULATION CONSULT NOTE - Initial Consult  Pharmacy Consult for warfarin Indication: h/o DVT  No Known Allergies  Patient Measurements: Weight: 167 lb 5.3 oz (75.9 kg) Heparin Dosing Weight:   Vital Signs: Temp: 97.8 F (36.6 C) (04/04 0802) Temp Source: Oral (04/04 0802) BP: 102/52 (04/04 1100) Pulse Rate: 108 (04/04 1100)  Labs: Recent Labs    10/08/17 1931 10/09/17 0350 10/10/17 0322 10/11/17 0757 10/11/17 1256  HGB 9.4* 9.7* 10.7* 11.1*  --   HCT 28.9* 29.8* 33.9* 36.1  --   PLT 268 265 336 312  --   LABPROT 26.0* 30.6*  --   --   --   INR 2.41 2.96  --   --   --   CREATININE 2.39* 2.17* 1.83* 1.93* 1.83*    Estimated Creatinine Clearance: 33.3 mL/min (A) (by C-G formula based on SCr of 1.83 mg/dL (H)).   Medical History: Past Medical History:  Diagnosis Date  . Acute deep vein thrombosis (DVT) of right upper extremity ([place]) 08/30/2017  . Chronic diastolic CHF (congestive heart failure) ([place]) 08/30/2017  . Cocaine abuse, continuous ([place])   . Diabetes mellitus    Hb A1C = 12.6 on 05/15/11, managed on Novolog 70/30, 35 U qam, 25 U qpm  . Hypertension    poorly controlled  . Insomnia disorder   . Shortness of breath   . Stroke [facility])     Assessment: 2 YOF presenting 4/1 with bleeding from tracheostomy site.  She was also found to have subcutaneous hematoma of R flank. She is on warfarin for history of DVT. She is on warfarin 8mg  PO daily prior to admission. Her last dose of warfarin was 3/31. Warfarin has been held since admission but Hgb/Hct remains stable, so pharmacy asked to resume.  Today, 10/11/2017  Last INR was checked 4/2 (INR = 2.96 and trending upward)  CBC: Hgb improved following PRBC 4/1, pltc WNL  No known active bleeding  Goal of Therapy:  INR 2-3, prefer 2-2.5 with recent bleeding events  Plan:   Check INR now to determine most appropriate dose of warfarin  Daily INR  Monitor for bleeding  Doreene Eland, PharmD, BCPS.    Pager: [phone] 10/11/2017 2:38 PM

## 2017-10-11 NOTE — Progress Notes (Addendum)
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  Patient presented from nursing home with complaints of bleeding from tracheostomy site.  Patient had 200-250 cc of bleeding from tracheostomy at the nursing facility.  Upon ED evaluation bleeding has stopped.  CT scan of the abdomen and pelvis was done show probably subcutaneous hematoma in the low right flank and overlying the right iliac wing measuring approximately 2.7-3 cm.  Patient found to be slightly anemic and was admitted for further workup.  She was given FFP and transfused 2 PRBCs.  Also found to have UTI and she was started on broad-spectrum antibiotics.  Subjective: Patient seen and examined, non significant changes, patient is non verbal. No acute events overnight.   Assessment & Plan: Acute blood loss anemia secondary to subcutaneous hematoma of the right flank/pelvic area No overt bleeding, FOBT negative H/H stable, will resume warfarin with no bridge She is a status post 2 units of PRBCs and 1 unit of FFP  Sepsis secondary to E. coli UTI Initially treated with vancomycin and Zosyn now being treated with Rocephin Urine culture shows E. coli and Klebsiella, MDR but sensitive to ceftriaxone. Remains afebrile, continue ceftriaxone for now  Hypernatremia - worsen Felt to be due to dehydration, water deficit about 7 L Increase D5W 200 cc/h, monitor CBGs closely Increase frequency of free water intake Will obtain urine sodium, creatinine and osmolality Monitor BMP every 6 hours  Hypertension Blood pressure elevated  today We will resume BP medication start labetalol and clonidine, will continue to hold enalapril due to renal function.  Continue Hydralazine IV as needed  AKI on CKD stage III Baseline creatinine 1.3 Continue IV fluids Check BMP in a.m.  Seizure disorders Continue Vimpat, Keppra, Topamax Seizure precautions  Status post trach/PEG Trach care per track team, continue feeding tubes per RD  Coccyx unstageable wound POA Wound care per RN protocol  DVT prophylaxis: SCDs Code Status: Full Code Family Communication: None at bedside  Disposition Plan: SNF when Na+ improve and Cr stable    Consultants:   None   Procedures:   None  Antimicrobials: Anti-infectives (From admission, onward)   Start     Dose/Rate Route Frequency Ordered Stop   10/10/17 1400  cefTRIAXone (ROCEPHIN) 1 g in dextrose 5 % 50 mL injection     100 mL/hr over 30 Minutes Intravenous Every 24 hours 10/10/17 1041     10/10/17 1045  cefTRIAXone (ROCEPHIN) 1 g in sodium chloride 0.9 % 100 mL IVPB  Status:  Discontinued     1 g 200 mL/hr over 30 Minutes Intravenous Every 24 hours 10/10/17 1039 10/10/17 1041   10/09/17 1400  cefTRIAXone (ROCEPHIN) 1 g in sodium chloride 0.9 % 100 mL IVPB  Status:  Discontinued     1 g 200 mL/hr over 30 Minutes Intravenous Every 24 hours 10/09/17 0726 10/10/17 1039   10/08/17 1200  piperacillin-tazobactam (ZOSYN) IVPB 2.25 g  Status:  Discontinued     2.25 g 100 mL/hr over 30 Minutes Intravenous Every 6 hours 10/08/17 0646 10/09/17 0726   10/08/17 0700  vancomycin (VANCOCIN) 1,750 mg in sodium chloride 0.9 % 500 mL IVPB  1,750 mg 250 mL/hr over 120 Minutes Intravenous  Once 10/08/17 0645 10/08/17 1001   10/08/17 0630  piperacillin-tazobactam (ZOSYN) IVPB 3.375 g  Status:  Discontinued     3.375 g 100 mL/hr over 30 Minutes Intravenous  Once 10/08/17 0622 10/09/17 0731   10/08/17 0630  vancomycin (VANCOCIN) IVPB 1000 mg/200 mL premix  Status:  Discontinued     1,000 mg 200  mL/hr over 60 Minutes Intravenous  Once 10/08/17 0622 10/08/17 0645       Objective: Vitals:   10/11/17 0525 10/11/17 0600 10/11/17 0800 10/11/17 0802  BP: (!) 162/68 (!) 148/56 (!) 193/83 (!) 193/83  Pulse: (!) 103 98 (!) 107 (!) 107  Resp: (!) 25  (!) 25 (!) 28  Temp:      TempSrc:      SpO2: 100% 99% 98% 98%  Weight:        Intake/Output Summary (Last 24 hours) at 10/11/2017 0826 Last data filed at 10/11/2017 0500 Gross per 24 hour  Intake 2738.92 ml  Output 900 ml  Net 1838.92 ml   Filed Weights   10/08/17 0600 10/09/17 0320 10/10/17 1300  Weight: 82.1 kg (181 lb) 80 kg (176 lb 5.9 oz) 75.9 kg (167 lb 5.3 oz)    Examination:  General: NAD nonverbal  Cardiovascular: RRR, S1/S2 +, no rubs, no gallops Respiratory: CTA bilaterally, no wheezing, no rhonchi Abdominal: Soft, NT, ND, bowel sounds + Extremities: no edema  Data Reviewed: I have personally reviewed following labs and imaging studies  CBC: Recent Labs  Lab 10/08/17 0353 10/08/17 1931 10/09/17 0350 10/10/17 0322  WBC 18.2* 13.1* 12.2* 15.5*  HGB 6.1* 9.4* 9.7* 10.7*  HCT 19.0* 28.9* 29.8* 33.9*  MCV 96.4 88.9 90.0 92.6  PLT 324 268 265 956   Basic Metabolic Panel: Recent Labs  Lab 10/08/17 0353 10/08/17 1931 10/09/17 0350 10/10/17 0322  NA 140 148* 154* 168*  K 5.1 4.2 4.1 3.8  CL 102 115* 121* >130*  CO2 22 21* 20* 19*  GLUCOSE 313* 267* 274* 243*  BUN 92* 229* 217* 208*  CREATININE 3.46* 2.39* 2.17* 1.83*  CALCIUM 9.2 8.9 9.0 10.0   GFR: Estimated Creatinine Clearance: 33.3 mL/min (A) (by C-G formula based on SCr of 1.83 mg/dL (H)). Liver Function Tests: Recent Labs  Lab 10/08/17 0353  AST 35  ALT 26  ALKPHOS 45  BILITOT 0.5  PROT 8.0  ALBUMIN 2.6*   No results for input(s): LIPASE, AMYLASE in the last 168 hours. No results for input(s): AMMONIA in the last 168 hours. Coagulation Profile: Recent Labs  Lab 10/08/17 0353 10/08/17 1931 10/09/17 0350  INR 2.47 2.41 2.96    Cardiac Enzymes: No results for input(s): CKTOTAL, CKMB, CKMBINDEX, TROPONINI in the last 168 hours. BNP (last 3 results) No results for input(s): PROBNP in the last 8760 hours. HbA1C: Recent Labs    10/08/17 1931  HGBA1C 8.3*   CBG: Recent Labs  Lab 10/10/17 1657 10/10/17 1922 10/10/17 2308 10/11/17 0324 10/11/17 0811  GLUCAP 209* 224* 271* 332* 283*   Lipid Profile: No results for input(s): CHOL, HDL, LDLCALC, TRIG, CHOLHDL, LDLDIRECT in the last 72 hours. Thyroid Function Tests: No results for input(s): TSH, T4TOTAL, FREET4, T3FREE, THYROIDAB in the last 72 hours. Anemia Panel: No results for input(s): VITAMINB12, FOLATE, FERRITIN, TIBC, IRON, RETICCTPCT in the last 72 hours. Sepsis Labs: No results for input(s): PROCALCITON, LATICACIDVEN in the last 168 hours.  Recent Results (from the past 240 hour(s))  MRSA PCR Screening  Status: None   Collection Time: 10/08/17 11:36 AM  Result Value Ref Range Status   MRSA by PCR NEGATIVE NEGATIVE Final    Comment:        The GeneXpert MRSA Assay (FDA approved for NASAL specimens only), is one component of a comprehensive MRSA colonization surveillance program. It is not intended to diagnose MRSA infection nor to guide or monitor treatment for MRSA infections. Performed at [facility], [place] [address], [place], [place] [address]   Urine culture     Status: Abnormal   Collection Time: 10/08/17 11:43 AM  Result Value Ref Range Status   Specimen Description   Final    URINE, RANDOM Performed at [facility] [address], [place], [place] [address]    Special Requests   Final    NONE Performed at [facility], [place] [address], [place],  [address]    Culture (A)  Final    >=100,000 COLONIES/mL ESCHERICHIA COLI >=100,000 COLONIES/mL KLEBSIELLA PNEUMONIAE    Report Status 10/11/2017 FINAL  Final   Organism ID, Bacteria ESCHERICHIA COLI (A)  Final    Organism ID, Bacteria KLEBSIELLA PNEUMONIAE (A)  Final      Susceptibility   Escherichia coli - MIC*    AMPICILLIN >=32 RESISTANT Resistant     CEFAZOLIN 16 SENSITIVE Sensitive     CEFTRIAXONE <=1 SENSITIVE Sensitive     CIPROFLOXACIN >=4 RESISTANT Resistant     GENTAMICIN <=1 SENSITIVE Sensitive     IMIPENEM <=0.25 SENSITIVE Sensitive     NITROFURANTOIN <=16 SENSITIVE Sensitive     TRIMETH/SULFA <=20 SENSITIVE Sensitive     AMPICILLIN/SULBACTAM >=32 RESISTANT Resistant     PIP/TAZO 8 SENSITIVE Sensitive     Extended ESBL NEGATIVE Sensitive     * >=100,000 COLONIES/mL ESCHERICHIA COLI   Klebsiella pneumoniae - MIC*    AMPICILLIN >=32 RESISTANT Resistant     CEFAZOLIN <=4 SENSITIVE Sensitive     CEFTRIAXONE <=1 SENSITIVE Sensitive     CIPROFLOXACIN <=0.25 SENSITIVE Sensitive     GENTAMICIN <=1 SENSITIVE Sensitive     IMIPENEM <=0.25 SENSITIVE Sensitive     NITROFURANTOIN 128 RESISTANT Resistant     TRIMETH/SULFA <=20 SENSITIVE Sensitive     AMPICILLIN/SULBACTAM 16 INTERMEDIATE Intermediate     PIP/TAZO 8 SENSITIVE Sensitive     Extended ESBL NEGATIVE Sensitive     * >=100,000 COLONIES/mL KLEBSIELLA PNEUMONIAE      Radiology Studies: No results found.    Scheduled Meds: . chlorhexidine  15 mL Mouth Rinse BID  . collagenase   Topical Daily  . feeding supplement (PRO-STAT SUGAR FREE 64)  30 mL Per Tube BID  . free water  500 mL Per Tube Q8H  . insulin aspart  0-9 Units Subcutaneous Q4H  . insulin aspart  2 Units Subcutaneous Q4H  . insulin glargine  15 Units Subcutaneous BID  . lacosamide  200 mg Per Tube BID  . levETIRAcetam  1,000 mg Per Tube BID  . mouth rinse  15 mL Mouth Rinse q12n4p  . nystatin  5 mL Oral QID  . pantoprazole (PROTONIX) IV  40 mg Intravenous Q12H  . phenytoin  250 mg Per Tube BID  . topiramate  200 mg Per Tube BID   Continuous Infusions: . sodium chloride    . small volume/piggyback builder Stopped (10/10/17 1445)  . dextrose 75 mL/hr at  10/11/17 0447  . feeding supplement (JEVITY 1.2 CAL) 1,000 mL (10/10/17 1402)  LOS: 3 days    Time spent: Total of 35 minutes spent with pt, greater than 50% of which was spent in discussion of  treatment, counseling and coordination of care   Chipper Oman, MD Pager: Text Page via [email or web address]   If 7PM-7AM, please contact night-coverage [email or web address] 10/11/2017, 8:26 AM   Note - This record has been created using [employer]. Chart creation errors have been sought, but may not always have been located. Such creation errors do not reflect on the standard of medical care.

## 2017-10-11 NOTE — Progress Notes (Signed)
Inpatient Diabetes Program Recommendations  AACE/ADA: New Consensus Statement on Inpatient Glycemic Control (2015)  Target Ranges:  Prepandial:   less than 140 mg/dL      Peak postprandial:   less than 180 mg/dL (1-2 hours)      Critically ill patients:  140 - 180 mg/dL   Lab Results  Component Value Date   GLUCAP 283 (H) 10/11/2017   HGBA1C 8.3 (H) 10/08/2017    Review of Glycemic Control  Blood sugars in 200-300s. Needs insulin adjustment.  Inpatient Diabetes Program Recommendations:    Increase Lantus to 20 units bid Increase TF coverage to 4 units Q4H.  Continue to follow.  Thank you. Lorenda Peck, RD, LDN, CDE Inpatient Diabetes Coordinator [phone]

## 2017-10-11 NOTE — Progress Notes (Signed)
CRITICAL VALUE ALERT  Critical Value:  Na 165  Date & Time Notied:  10/11/17 @ 2100  Provider Notified: Tamala Julian  Orders Received/Actions taken: pending     CRITICAL VALUE ALERT  Critical Value:  Chloride >130  Date & Time Notied:  10/11/17  Provider Notified: Tamala Julian  Orders Received/Actions taken: pending

## 2017-10-12 ENCOUNTER — Inpatient Hospital Stay (HOSPITAL_COMMUNITY): Payer: Medicaid Other

## 2017-10-12 LAB — BASIC METABOLIC PANEL
ANION GAP: 14 (ref 5–15)
ANION GAP: 9 (ref 5–15)
BUN: 171 mg/dL — ABNORMAL HIGH (ref 6–20)
BUN: 169 mg/dL — AB (ref 6–20)
BUN: 173 mg/dL — ABNORMAL HIGH (ref 6–20)
CALCIUM: 9.1 mg/dL (ref 8.9–10.3)
CHLORIDE: 121 mmol/L — AB (ref 101–111)
CO2: 17 mmol/L — AB (ref 22–32)
CO2: 15 mmol/L — AB (ref 22–32)
CO2: 17 mmol/L — AB (ref 22–32)
CREATININE: 2.34 mg/dL — AB (ref 0.44–1.00)
Calcium: 8.9 mg/dL (ref 8.9–10.3)
Calcium: 8.6 mg/dL — ABNORMAL LOW (ref 8.9–10.3)
Chloride: >130 mmol/L (ref 101–111)
Chloride: 124 mmol/L — ABNORMAL HIGH (ref 101–111)
Creatinine, Ser: 2.18 mg/dL — ABNORMAL HIGH (ref 0.44–1.00)
Creatinine, Ser: 2.01 mg/dL — ABNORMAL HIGH (ref 0.44–1.00)
GFR calc Af Amer: 25 mL/min — ABNORMAL LOW (ref >60)
GFR calc Af Amer: 27 mL/min — ABNORMAL LOW (ref >60)
GFR calc Af Amer: 30 mL/min — ABNORMAL LOW (ref >60)
GFR calc non Af Amer: 23 mL/min — ABNORMAL LOW (ref >60)
GFR calc non Af Amer: 26 mL/min — ABNORMAL LOW (ref >60)
GFR, EST NON AFRICAN AMERICAN: 21 mL/min — AB (ref >60)
GLUCOSE: 346 mg/dL — AB (ref 65–99)
GLUCOSE: 271 mg/dL — AB (ref 65–99)
GLUCOSE: 244 mg/dL — AB (ref 65–99)
POTASSIUM: 3.7 mmol/L (ref 3.5–5.1)
Potassium: 3.4 mmol/L — ABNORMAL LOW (ref 3.5–5.1)
Potassium: 3.4 mmol/L — ABNORMAL LOW (ref 3.5–5.1)
Sodium: 161 mmol/L (ref 135–145)
Sodium: 153 mmol/L — ABNORMAL HIGH (ref 135–145)
Sodium: 147 mmol/L — ABNORMAL HIGH (ref 135–145)

## 2017-10-12 LAB — TYPE AND SCREEN
ABO/RH(D): O POS
Antibody Screen: NEGATIVE
UNIT DIVISION: 0
UNIT DIVISION: 0
UNIT DIVISION: 0
Unit division: 0
Unit division: 0
Unit division: 0
Unit division: 0

## 2017-10-12 LAB — BPAM RBC
BLOOD PRODUCT EXPIRATION DATE: 201904172359
BLOOD PRODUCT EXPIRATION DATE: 201904292359
BLOOD PRODUCT EXPIRATION DATE: 201904292359
BLOOD PRODUCT EXPIRATION DATE: 201904302359
Blood Product Expiration Date: 201904252359
Blood Product Expiration Date: 201904292359
Blood Product Expiration Date: 201904302359
ISSUE DATE / TIME: 201904010657
ISSUE DATE / TIME: 201904021013
ISSUE DATE / TIME: 201904021014
ISSUE DATE / TIME: 201904010634
ISSUE DATE / TIME: 201904010657
UNIT TYPE AND RH: 5100
UNIT TYPE AND RH: 9500
Unit Type and Rh: 5100
Unit Type and Rh: 5100
Unit Type and Rh: 5100
Unit Type and Rh: 5100
Unit Type and Rh: 5100

## 2017-10-12 LAB — CBC WITH DIFFERENTIAL/PLATELET
Basophils Absolute: 0 10*3/uL (ref 0.0–0.1)
Basophils Relative: 0 %
EOS ABS: 0.3 10*3/uL (ref 0.0–0.7)
Eosinophils Relative: 2 %
HCT: 34 % — ABNORMAL LOW (ref 36.0–46.0)
Hemoglobin: 10.1 g/dL — ABNORMAL LOW (ref 12.0–15.0)
LYMPHS ABS: 2.4 10*3/uL (ref 0.7–4.0)
Lymphocytes Relative: 16 %
MCH: 28.9 pg (ref 26.0–34.0)
MCHC: 29.7 g/dL — AB (ref 30.0–36.0)
MCV: 97.1 fL (ref 78.0–100.0)
MONO ABS: 0.3 10*3/uL (ref 0.1–1.0)
Monocytes Relative: 2 %
NEUTROS ABS: 12.3 10*3/uL — AB (ref 1.7–7.7)
Neutrophils Relative %: 80 %
PLATELETS: 261 10*3/uL (ref 150–400)
RBC: 3.5 MIL/uL — ABNORMAL LOW (ref 3.87–5.11)
RDW: 20.2 % — AB (ref 11.5–15.5)
WBC: 15.3 10*3/uL — ABNORMAL HIGH (ref 4.0–10.5)

## 2017-10-12 LAB — SODIUM, URINE, RANDOM: Sodium, Ur: 28 mmol/L

## 2017-10-12 LAB — CREATININE, URINE, RANDOM: CREATININE, URINE: 73.98 mg/dL

## 2017-10-12 LAB — OSMOLALITY, URINE: Osmolality, Ur: 493 mOsm/kg (ref 300–900)

## 2017-10-12 LAB — GLUCOSE, CAPILLARY
GLUCOSE-CAPILLARY: 296 mg/dL — AB (ref 65–99)
GLUCOSE-CAPILLARY: 230 mg/dL — AB (ref 65–99)
GLUCOSE-CAPILLARY: 187 mg/dL — AB (ref 65–99)
Glucose-Capillary: 210 mg/dL — ABNORMAL HIGH (ref 65–99)
Glucose-Capillary: 255 mg/dL — ABNORMAL HIGH (ref 65–99)
Glucose-Capillary: 278 mg/dL — ABNORMAL HIGH (ref 65–99)

## 2017-10-12 LAB — PROTIME-INR
INR: 2.97
Prothrombin Time: 30.7 seconds — ABNORMAL HIGH (ref 11.4–15.2)

## 2017-10-12 MED ORDER — FREE WATER
200.0000 mL | Freq: Four times a day (QID) | Status: DC
  Administered 2017-10-12 – 2017-10-17 (×20): 200 mL

## 2017-10-12 MED ORDER — POTASSIUM PHOSPHATE MONOBASIC 500 MG PO TABS
1000.0000 mg | ORAL_TABLET | Freq: Once | ORAL | Status: AC
  Administered 2017-10-12: 1000 mg
  Filled 2017-10-12: qty 2

## 2017-10-12 NOTE — Progress Notes (Signed)
Paged MD and spoke on phone to update on pt's status. Over the past 30-50mins, pt has become increasingly restless, grimacing, HR elevated into the upper 90's (pt's baseline lower 70-80's), squirming in bed, & CPOT scores increasing. This RN requested to consider pain medications as a PRN order. MD aware- no new orders.

## 2017-10-12 NOTE — Progress Notes (Signed)
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  Patient presented from nursing home with complaints of bleeding from tracheostomy site.  Patient had 200-250 cc of bleeding from tracheostomy at the nursing facility.  Upon ED evaluation bleeding has stopped.  CT scan of the abdomen and pelvis was done show probably subcutaneous hematoma in the low right flank and overlying the right iliac wing measuring approximately 2.7-3 cm.  Patient found to be slightly anemic and was admitted for further workup.  She was given FFP and transfused 2 PRBCs.  Also found to have UTI and she was started on broad-spectrum antibiotics.  Subjective: Patient seen and examined, open her eyes upon calling her name.  Nonverbal.  No significant changes  Assessment & Plan: Acute blood loss anemia secondary to subcutaneous hematoma of the right flank/pelvic area No overt bleeding, FOBT negative Slight drop in hemoglobin likely hemodilution she is receiving aggressive IV fluid hydration due to hypernatremia. Status post 2 units of PRBCs and 1 unit of FFP  Sepsis secondary to E. coli UTI Initially treated with vancomycin and Zosyn now being treated with Rocephin Urine culture shows E. coli and Klebsiella, MDR but sensitive to ceftriaxone. Remains afebrile, continue ceftriaxone to complete 7 days of antibiotic  Hypernatremia - sodium improved Water deficit about 3 L, insensible losses Will decrease D5W rate to 100 cc/h, monitor for signs of fluid overload Decrease amount of free water intake to 200 cc every 6  hours Urine osmolality 493 Monitor BMP every 12-hour  Hypertension Blood pressure significantly improved Continue labetalol and clonidine, will continue to hold enalapril due to renal function. Hydralazine IV as needed Monitor BP  AKI on CKD stage III Baseline creatinine 1.3, creatinine has worsened Continue IV fluids, avoid hypotension and nephrotoxic agent Check renal function in a.m.  Seizure disorders Continue Vimpat, Keppra, Topamax Seizure precautions  Status post trach/PEG Trach care per track team, continue feeding tubes per RD  Coccyx unstageable wound POA Wound care per RN protocol  DVT prophylaxis: SCDs Code Status: Full Code Family Communication: None at bedside  Disposition Plan: SNF when Na+ improve and Cr stable    Consultants:   None   Procedures:   None  Antimicrobials: Anti-infectives (From admission, onward)   Start     Dose/Rate Route Frequency Ordered Stop   10/10/17 1400  cefTRIAXone (ROCEPHIN) 1 g in dextrose 5 % 50 mL injection     100 mL/hr over 30 Minutes Intravenous Every 24 hours 10/10/17 1041     10/10/17 1045  cefTRIAXone (ROCEPHIN) 1 g in sodium chloride 0.9 % 100 mL IVPB  Status:  Discontinued     1 g 200 mL/hr over 30 Minutes Intravenous Every 24 hours 10/10/17 1039 10/10/17 1041   10/09/17 1400  cefTRIAXone (ROCEPHIN) 1 g in sodium chloride 0.9 % 100 mL IVPB  Status:  Discontinued     1 g 200 mL/hr over 30 Minutes Intravenous Every 24 hours 10/09/17 0726 10/10/17 1039   10/08/17 1200  piperacillin-tazobactam (ZOSYN) IVPB 2.25 g  Status:  Discontinued     2.25 g 100 mL/hr over 30 Minutes Intravenous Every 6 hours 10/08/17 0646 10/09/17  8850   10/08/17 0700  vancomycin (VANCOCIN) 1,750 mg in sodium chloride 0.9 % 500 mL IVPB     1,750 mg 250 mL/hr over 120 Minutes Intravenous  Once 10/08/17 0645 10/08/17 1001   10/08/17 0630  piperacillin-tazobactam (ZOSYN) IVPB 3.375 g  Status:  Discontinued     3.375 g 100 mL/hr over 30  Minutes Intravenous  Once 10/08/17 0622 10/09/17 0731   10/08/17 0630  vancomycin (VANCOCIN) IVPB 1000 mg/200 mL premix  Status:  Discontinued     1,000 mg 200 mL/hr over 60 Minutes Intravenous  Once 10/08/17 0622 10/08/17 0645       Objective: Vitals:   10/12/17 0700 10/12/17 0734 10/12/17 0800 10/12/17 0919  BP: (!) 149/120 138/62 (!) 152/104 (!) 152/102  Pulse: 90 94 92 91  Resp: (!) 22 17 (!) 26   Temp:   98.5 F (36.9 C)   TempSrc:   Oral   SpO2: 99% 100% 100%   Weight:      Height:        Intake/Output Summary (Last 24 hours) at 10/12/2017 0949 Last data filed at 10/12/2017 0900 Gross per 24 hour  Intake 6179.66 ml  Output 1270 ml  Net 4909.66 ml   Filed Weights   10/09/17 0320 10/10/17 1300 10/12/17 0101  Weight: 80 kg (176 lb 5.9 oz) 75.9 kg (167 lb 5.3 oz) 69.4 kg (153 lb)    Examination:  General: NAD, nonverbal Cardiovascular: RRR, S1/S2 + Respiratory: CTA bilaterally, no wheezing, no rhonchi, + trach Abdominal: Soft, NT, ND, + bowel sounds, + PEG tube Extremities: no edema,  Data Reviewed: I have personally reviewed following labs and imaging studies  CBC: Recent Labs  Lab 10/08/17 1931 10/09/17 0350 10/10/17 0322 10/11/17 0757 10/12/17 0109  WBC 13.1* 12.2* 15.5* 18.5* 15.3*  NEUTROABS  --   --   --  14.1* 12.3*  HGB 9.4* 9.7* 10.7* 11.1* 10.1*  HCT 28.9* 29.8* 33.9* 36.1 34.0*  MCV 88.9 90.0 92.6 96.5 97.1  PLT 268 265 336 312 277   Basic Metabolic Panel: Recent Labs  Lab 10/11/17 0757 10/11/17 1256 10/11/17 1924 10/12/17 0109 10/12/17 0654  NA 171* 174* 165* 161* 153*  K 3.6 3.6 3.6 3.4* 3.4*  CL >130* >130* >130* >130* 124*  CO2 16* 17* 17* 17* 15*  GLUCOSE 370* 363* 533* 346* 271*  BUN 176* 174* 158* 171* 169*  CREATININE 1.93* 1.83* 2.18* 2.34* 2.18*  CALCIUM 9.8 9.9 9.4 9.1 8.9   GFR: Estimated Creatinine Clearance: 25.7 mL/min (A) (by C-G formula based on SCr of 2.18 mg/dL (H)). Liver Function Tests: Recent Labs  Lab  10/08/17 0353  AST 35  ALT 26  ALKPHOS 45  BILITOT 0.5  PROT 8.0  ALBUMIN 2.6*   No results for input(s): LIPASE, AMYLASE in the last 168 hours. No results for input(s): AMMONIA in the last 168 hours. Coagulation Profile: Recent Labs  Lab 10/08/17 0353 10/08/17 1931 10/09/17 0350 10/11/17 1504 10/12/17 0109  INR 2.47 2.41 2.96 3.25 2.97   Cardiac Enzymes: No results for input(s): CKTOTAL, CKMB, CKMBINDEX, TROPONINI in the last 168 hours. BNP (last 3 results) No results for input(s): PROBNP in the last 8760 hours. HbA1C: No results for input(s): HGBA1C in the last 72 hours. CBG: Recent Labs  Lab 10/11/17 1929 10/11/17 2055 10/11/17 2313 10/12/17 0326 10/12/17 0825  GLUCAP 435* 413* 378* 210* 278*   Lipid Profile: No results for input(s): CHOL, HDL, LDLCALC, TRIG, CHOLHDL, LDLDIRECT in the last 72  hours. Thyroid Function Tests: No results for input(s): TSH, T4TOTAL, FREET4, T3FREE, THYROIDAB in the last 72 hours. Anemia Panel: No results for input(s): VITAMINB12, FOLATE, FERRITIN, TIBC, IRON, RETICCTPCT in the last 72 hours. Sepsis Labs: No results for input(s): PROCALCITON, LATICACIDVEN in the last 168 hours.  Recent Results (from the past 240 hour(s))  MRSA PCR Screening     Status: None   Collection Time: 10/08/17 11:36 AM  Result Value Ref Range Status   MRSA by PCR NEGATIVE NEGATIVE Final    Comment:        The GeneXpert MRSA Assay (FDA approved for NASAL specimens only), is one component of a comprehensive MRSA colonization surveillance program. It is not intended to diagnose MRSA infection nor to guide or monitor treatment for MRSA infections. Performed at [facility], [place] [address], [place], [place] [address]   Urine culture     Status: Abnormal   Collection Time: 10/08/17 11:43 AM  Result Value Ref Range Status   Specimen Description   Final    URINE, RANDOM Performed at [facility] [address], [place], [place] [address]    Special Requests   Final    NONE Performed at [facility], [place] [address], [place], [place] [address]    Culture (A)  Final    >=100,000 COLONIES/mL ESCHERICHIA COLI >=100,000 COLONIES/mL KLEBSIELLA PNEUMONIAE    Report Status 10/11/2017 FINAL  Final   Organism ID, Bacteria ESCHERICHIA COLI (A)  Final   Organism ID, Bacteria KLEBSIELLA PNEUMONIAE (A)  Final      Susceptibility   Escherichia coli - MIC*    AMPICILLIN >=32 RESISTANT Resistant     CEFAZOLIN 16 SENSITIVE Sensitive     CEFTRIAXONE <=1 SENSITIVE Sensitive     CIPROFLOXACIN >=4 RESISTANT Resistant     GENTAMICIN <=1 SENSITIVE Sensitive     IMIPENEM <=0.25 SENSITIVE Sensitive     NITROFURANTOIN <=16 SENSITIVE Sensitive     TRIMETH/SULFA <=20 SENSITIVE Sensitive     AMPICILLIN/SULBACTAM >=32 RESISTANT Resistant     PIP/TAZO 8 SENSITIVE Sensitive     Extended ESBL NEGATIVE Sensitive     * >=100,000 COLONIES/mL ESCHERICHIA COLI   Klebsiella pneumoniae - MIC*    AMPICILLIN >=32 RESISTANT Resistant     CEFAZOLIN <=4 SENSITIVE Sensitive     CEFTRIAXONE <=1 SENSITIVE Sensitive     CIPROFLOXACIN <=0.25 SENSITIVE Sensitive     GENTAMICIN <=1 SENSITIVE Sensitive     IMIPENEM <=0.25 SENSITIVE Sensitive     NITROFURANTOIN 128 RESISTANT Resistant     TRIMETH/SULFA <=20 SENSITIVE Sensitive     AMPICILLIN/SULBACTAM 16 INTERMEDIATE Intermediate     PIP/TAZO 8 SENSITIVE Sensitive     Extended ESBL NEGATIVE Sensitive     * >=100,000 COLONIES/mL KLEBSIELLA PNEUMONIAE      Radiology Studies: Dg Shoulder Left  Result Date: 10/11/2017 CLINICAL DATA:  Recent fall, nonverbal patient EXAM: LEFT SHOULDER - 2+ VIEW COMPARISON:  Chest x-ray of 10/08/2017 FINDINGS: There is degenerative change involving the acromion with some spurring present. However no fracture is seen. The left humeral head is in normal position and the glenohumeral joint space appears normal. The left clavicle appears  intact. Tracheostomy is present. IMPRESSION: 1. Degenerative change of the acromion possibly leading to impingement. 2. No fracture. Electronically Signed   By: Ivar Drape M.D.   On: 10/11/2017 11:06    Scheduled Meds: . chlorhexidine  15 mL Mouth Rinse BID  . cloNIDine  0.2 mg  Per Tube TID  . collagenase   Topical Daily  . feeding supplement (PRO-STAT SUGAR FREE 64)  30 mL Per Tube BID  . free water  500 mL Per Tube Q6H  . insulin aspart  0-15 Units Subcutaneous Q4H  . insulin aspart  2 Units Subcutaneous Q4H  . insulin glargine  40 Units Subcutaneous BID  . labetalol  400 mg Per Tube TID  . lacosamide  200 mg Per Tube BID  . levETIRAcetam  1,000 mg Per Tube BID  . mouth rinse  15 mL Mouth Rinse q12n4p  . nystatin  5 mL Oral QID  . pantoprazole (PROTONIX) IV  40 mg Intravenous Q12H  . phenytoin  250 mg Per Tube BID  . topiramate  200 mg Per Tube BID  . Warfarin - Pharmacist Dosing Inpatient   Does not apply q1800   Continuous Infusions: . sodium chloride    . small volume/piggyback builder Stopped (10/11/17 1500)  . dextrose 200 mL/hr at 10/12/17 0647  . feeding supplement (GLUCERNA 1.2 CAL) Stopped (10/12/17 0845)     LOS: 4 days    Time spent: Total of 35 minutes spent with pt, greater than 50% of which was spent in discussion of  treatment, counseling and coordination of care   Chipper Oman, MD Pager: Text Page via [email or web address]   If 7PM-7AM, please contact night-coverage [email or web address] 10/12/2017, 9:49 AM   Note - This record has been created using [employer]. Chart creation errors have been sought, but may not always have been located. Such creation errors do not reflect on the standard of medical care.

## 2017-10-12 NOTE — Progress Notes (Signed)
Inpatient Diabetes Program Recommendations  AACE/ADA: New Consensus Statement on Inpatient Glycemic Control (2015)  Target Ranges:  Prepandial:   less than 140 mg/dL      Peak postprandial:   less than 180 mg/dL (1-2 hours)      Critically ill patients:  140 - 180 mg/dL   Lab Results  Component Value Date   GLUCAP 278 (H) 10/12/2017   HGBA1C 8.3 (H) 10/08/2017    Review of Glycemic Control  Blood sugars above goal of 140-180 mg/dL Needs insulin adjustment. HgbA1C - 8.1%  Inpatient Diabetes Program Recommendations:     Increase Novolog to 4 units Q4H for TF coverage.  Continue to follow.  Thank you. Lorenda Peck, RD, LDN, CDE Inpatient Diabetes Coordinator [phone]

## 2017-10-12 NOTE — Progress Notes (Signed)
Nutrition Follow-up  DOCUMENTATION CODES:   Obesity unspecified  INTERVENTION:  - Continue Glucerna 1.2 @ 70 mL/hr x20 hours (hold x4 hours per day for Dilantin) with 30 mL Prostat BID. - Free water flush to continue to be per MD.   NUTRITION DIAGNOSIS:   Inadequate oral intake related to inability to eat as evidenced by NPO status. -ongoing  GOAL:   Patient will meet greater than or equal to 90% of their needs -met with TF regimen  MONITOR:   TF tolerance, Weight trends, Labs, Skin, I & O's  ASSESSMENT:   62 y.o. 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 She was brought in by EMS from nursing home with complaints of bleeding from the tracheostomy. No documented fever or chills, nausea, vomiting, diarrhea, or abdominal pain.  Patient has a trach and PEG. Patient has had multiple hospital admissions the last 6 months for diabetic ketoacidosis, acute encephalopathy and stroke, possible acute bacterial meningitis, status epilepticus, pneumonia and acute respiratory failure with hypoxemia.  According to the daughter, she used to speak but has not spoken for at least the past month.   Weight -14 lbs/6.5 kg from 4/3-4/5; will continue to monitor weight trends closely given high rate IVF and free water flush. Pt with trach and PEG and is receiving Glucerna 1.2 @ 70 mL/hr x20 hours with 30 mL Prostat BID which is providing 1880 kcal, 114 grams of protein, and 1569 mL free water. Free water flush per MD increased yesterday at 2:00 PM to 500 mL QID (2L/day).   Medications reviewed; sliding scale Novolog, 2 units Novolog every 4 hours, 40 units Lantus/day, 5 mL Mycostatin QID, 40 mg IV Protonix BID, 250 mg Dilantin BID, 1000 mg KPhos  x1 dose today. Labs reviewed; CBGs: 210 and 278 mg/dL today, Na: 153 mmol/L, K: 3.4 mmol/L, Cl: 124 mmol/L, BUN: 169 mg/dL, creatinine: 2.18 mg/dL, GFR: 27 mL/hr.   IVF: D5 @ 200 mL/hr ordered yesterday at 10:00 AM (850 kcal).      Diet Order:  Diet NPO time specified Seizure precautions  EDUCATION NEEDS:   No education needs have been identified at this time  Skin:  Skin Assessment: Skin Integrity Issues: Skin Integrity Issues:: Other (Comment) Unstageable: coccyx Other: R buttocks and R pretibial wound  Last BM:  4/5  Height:   Ht Readings from Last 1 Encounters:  10/11/17 _0  (1.676 m)    Weight:   Wt Readings from Last 1 Encounters:  10/12/17 153 lb (69.4 kg)    Ideal Body Weight:  56.82 kg  BMI:  Body mass index is 24.69 kg/m.  Estimated Nutritional Needs:   Kcal:  1890-2050 (23-25 kcal/kg)  Protein:  100-115 grams (1.2-1.4 grams/kg)  Fluid:  >/= 1.8 L/day      Jarome Matin, MS, RD, LDN, [facility] Inpatient Clinical Dietitian Pager # [phone] After hours/weekend pager # [phone]

## 2017-10-12 NOTE — Progress Notes (Signed)
CRITICAL VALUE ALERT  Critical Value:  Na 161  Date & Time Notied:  10/12/17 @ 2099  Provider Notified: Harvest Forest, MD  Orders Received/Actions taken: pending   CRITICAL VALUE ALERT  Critical Value:  Chloride >130  Date & Time Notied:  10/12/17 @0224   Provider Notified: Harvest Forest, MD  Orders Received/Actions taken: pending

## 2017-10-12 NOTE — Progress Notes (Signed)
ANTICOAGULATION CONSULT NOTE - follow up  Pharmacy Consult for warfarin Indication: h/o DVT  No Known Allergies  Patient Measurements: Height: 5\' 6"  (167.6 cm) Weight: 153 lb (69.4 kg) IBW/kg (Calculated) : 59.3 Heparin Dosing Weight:   Vital Signs: Temp: 98.2 F (36.8 C) (04/05 0359) Temp Source: Axillary (04/05 0359) BP: 152/104 (04/05 0800) Pulse Rate: 92 (04/05 0800)  Labs: Recent Labs    10/10/17 0322 10/11/17 0757  10/11/17 1504 10/11/17 1924 10/12/17 0109 10/12/17 0654  HGB 10.7* 11.1*  --   --   --  10.1*  --   HCT 33.9* 36.1  --   --   --  34.0*  --   PLT 336 312  --   --   --  261  --   LABPROT  --   --   --  32.9*  --  30.7*  --   INR  --   --   --  3.25  --  2.97  --   CREATININE 1.83* 1.93*   < >  --  2.18* 2.34* 2.18*   < > = values in this interval not displayed.    Estimated Creatinine Clearance: 25.7 mL/min (A) (by C-G formula based on SCr of 2.18 mg/dL (H)).   Medical History: Past Medical History:  Diagnosis Date  . Acute deep vein thrombosis (DVT) of right upper extremity ([place]) 08/30/2017  . Chronic diastolic CHF (congestive heart failure) ([place]) 08/30/2017  . Cocaine abuse, continuous ([place])   . Diabetes mellitus    Hb A1C = 12.6 on 05/15/11, managed on Novolog 70/30, 35 U qam, 25 U qpm  . Hypertension    poorly controlled  . Insomnia disorder   . Shortness of breath   . Stroke [facility])     Assessment: 19 YOF presenting 4/1 with bleeding from tracheostomy site.  She was also found to have subcutaneous hematoma of R flank. She is on warfarin for history of DVT. She is on warfarin 8mg  PO daily prior to admission. Her last dose of warfarin was 3/31. Warfarin has been held since admission but Hgb/Hct remains stable, so pharmacy asked to resume.  Today, 10/12/2017  INR remains at the upper end goal despite no warfarin since prior to admission (3/31)  CBC: Hgb dec overnight (? Dilutional from high volumes of free water to treat hyperNa+), pltc  WNL  No known active bleeding  Goal of Therapy:  INR 2-3, prefer 2-2.5 with recent bleeding events  Plan:   No warfarin today, plan to resume when INR ~2.5  Daily INR  Monitor for bleeding  Doreene Eland, PharmD, BCPS.   Pager: [phone] 10/12/2017 8:41 AM

## 2017-10-13 LAB — CBC
HEMATOCRIT: 29.4 % — AB (ref 36.0–46.0)
Hemoglobin: 9.4 g/dL — ABNORMAL LOW (ref 12.0–15.0)
MCH: 29.7 pg (ref 26.0–34.0)
MCHC: 32 g/dL (ref 30.0–36.0)
MCV: 92.7 fL (ref 78.0–100.0)
PLATELETS: 232 10*3/uL (ref 150–400)
RBC: 3.17 MIL/uL — ABNORMAL LOW (ref 3.87–5.11)
RDW: 18.4 % — AB (ref 11.5–15.5)
WBC: 17.6 10*3/uL — AB (ref 4.0–10.5)

## 2017-10-13 LAB — GLUCOSE, CAPILLARY
GLUCOSE-CAPILLARY: 128 mg/dL — AB (ref 65–99)
GLUCOSE-CAPILLARY: 85 mg/dL (ref 65–99)
Glucose-Capillary: 220 mg/dL — ABNORMAL HIGH (ref 65–99)
Glucose-Capillary: 207 mg/dL — ABNORMAL HIGH (ref 65–99)
Glucose-Capillary: 247 mg/dL — ABNORMAL HIGH (ref 65–99)
Glucose-Capillary: 205 mg/dL — ABNORMAL HIGH (ref 65–99)

## 2017-10-13 LAB — BASIC METABOLIC PANEL
Anion gap: 11 (ref 5–15)
BUN: 148 mg/dL — AB (ref 6–20)
CO2: 16 mmol/L — ABNORMAL LOW (ref 22–32)
CREATININE: 1.71 mg/dL — AB (ref 0.44–1.00)
Calcium: 8.6 mg/dL — ABNORMAL LOW (ref 8.9–10.3)
Chloride: 120 mmol/L — ABNORMAL HIGH (ref 101–111)
GFR calc Af Amer: 36 mL/min — ABNORMAL LOW (ref >60)
GFR, EST NON AFRICAN AMERICAN: 31 mL/min — AB (ref >60)
GLUCOSE: 218 mg/dL — AB (ref 65–99)
POTASSIUM: 3.4 mmol/L — AB (ref 3.5–5.1)
Sodium: 147 mmol/L — ABNORMAL HIGH (ref 135–145)

## 2017-10-13 LAB — PROTIME-INR
INR: 1.61
PROTHROMBIN TIME: 19 s — AB (ref 11.4–15.2)

## 2017-10-13 MED ORDER — POTASSIUM CHLORIDE 20 MEQ/15ML (10%) PO SOLN
40.0000 meq | Freq: Once | ORAL | Status: AC
  Administered 2017-10-13: 40 meq via ORAL
  Filled 2017-10-13: qty 30

## 2017-10-13 MED ORDER — SODIUM CHLORIDE 0.45 % IV SOLN
INTRAVENOUS | Status: DC
  Administered 2017-10-13 – 2017-10-14 (×2): via INTRAVENOUS

## 2017-10-13 MED ORDER — DEXTROSE 5 % IV SOLN
INTRAVENOUS | Status: AC
  Administered 2017-10-13 – 2017-10-14 (×2): via INTRAVENOUS
  Filled 2017-10-13 (×2): qty 10

## 2017-10-13 MED ORDER — AMLODIPINE BESYLATE 5 MG PO TABS
5.0000 mg | ORAL_TABLET | Freq: Every day | ORAL | Status: DC
  Administered 2017-10-13: 5 mg
  Filled 2017-10-13: qty 1

## 2017-10-13 MED ORDER — AMLODIPINE 1 MG/ML ORAL SUSPENSION: 5.0000 mg | Freq: Every day | ORAL | Status: DC

## 2017-10-13 MED ORDER — WARFARIN SODIUM 4 MG PO TABS
4.0000 mg | ORAL_TABLET | Freq: Once | ORAL | Status: AC
  Administered 2017-10-13: 4 mg via ORAL
  Filled 2017-10-13: qty 1

## 2017-10-13 NOTE — Progress Notes (Signed)
PROGRESS NOTE [facility]   Jay   ZGY:[id number] DOB: [birth year]  DOA: 10/08/2017 PCP: Nolene Ebbs, MD   Brief Narrative:  [name] is a 62 year old female with medical history significant of CKD stage III, hypertension, right upper extremity DVT on Coumadin, uncontrolled diabetes, hepatitis C and left pontine intracranial hemorrhage with residual right side hemiparesis and acute lacunar infarct April 2018, trach dependent.  Patient presented from nursing home with complaints of bleeding from tracheostomy site.  Patient had 200-250 cc of bleeding from tracheostomy at the nursing facility.  Upon ED evaluation bleeding has stopped.  CT scan of the abdomen and pelvis was done show probably subcutaneous hematoma in the low right flank and overlying the right iliac wing measuring approximately 2.7-3 cm.  Patient found to be slightly anemic and was admitted for further workup.  She was given FFP and transfused 2 PRBCs.  Also found to have UTI and she was started on broad-spectrum antibiotics.  Subjective: Patient seen and examined, sodium much improved.  No significant changes on mental status.  Assessment & Plan: Acute blood loss anemia secondary to subcutaneous hematoma of the right flank/pelvic area No overt bleeding, FOBT negative Hemoglobin dropped monitor for, still no signs of overt bleeding.  Subcutaneous hematoma seems to be decreasing.  Patient received aggressive hydration this could be related to hemodilution.  We will continue to monitor closely.  Patient is status post 2 units of PRBCs and 1 unit of FFP  Sepsis secondary to E. coli UTI Initially treated with vancomycin and Zosyn now being treated with Rocephin Urine culture shows E. coli and Klebsiella, MDR but sensitive to ceftriaxone. Remains afebrile, continue ceftriaxone to complete 7 days of antibiotic  Hypernatremia - sodium continues to improve Insensible losses, water deficit about 2 L IV fluids  changed to half NS at 50 cc to avoid hyperglycemia Continue free water intake to 200 cc every 6 hours Urine osmolality 493 Monitor BMP daily  Hypertension Blood pressure above goal, but much improved We will continue labetalol and clonidine since patient lisinopril has continued to be on hold will add low-dose amlodipine 5 mg daily Continue hydralazine as needed and monitor BP  AKI on CKD stage III/Metabolic acidosis with hyperchloremia ? RTA  Baseline creatinine 1.3 Cr has improved with aggressive IVF  Check urine pH and get serum phos, CMP and Mag  IVF change to 0.45NS at 50 cc/hr   Seizure disorders - no seizing events Continue Vimpat, Keppra, Topamax Seizure precautions  Status post trach/PEG Trach care per track team, continue feeding tubes per RD  Coccyx unstageable wound POA Wound care per RN protocol  DVT prophylaxis: SCDs Code Status: Full Code Family Communication: None at bedside  Disposition Plan: SNF when Na+ improve and Cr stable    Consultants:   None   Procedures:   None  Antimicrobials: Anti-infectives (From admission, onward)   Start     Dose/Rate Route Frequency Ordered Stop   10/13/17 1400  cefTRIAXone (ROCEPHIN) 1 g in dextrose 5 % 50 mL injection     100 mL/hr over 30 Minutes Intravenous Every 24 hours 10/13/17 0613     10/10/17 1400  cefTRIAXone (ROCEPHIN) 1 g in dextrose 5 % 50 mL injection  Status:  Discontinued     100 mL/hr over 30 Minutes Intravenous Every 24 hours 10/10/17 1041 10/13/17 0611   10/10/17 1045  cefTRIAXone (ROCEPHIN) 1 g in sodium chloride 0.9 % 100 mL IVPB  Status:  Discontinued  1 g 200 mL/hr over 30 Minutes Intravenous Every 24 hours 10/10/17 1039 10/10/17 1041   10/09/17 1400  cefTRIAXone (ROCEPHIN) 1 g in sodium chloride 0.9 % 100 mL IVPB  Status:  Discontinued     1 g 200 mL/hr over 30 Minutes Intravenous Every 24 hours 10/09/17 0726 10/10/17 1039   10/08/17 1200  piperacillin-tazobactam (ZOSYN) IVPB 2.25 g   Status:  Discontinued     2.25 g 100 mL/hr over 30 Minutes Intravenous Every 6 hours 10/08/17 0646 10/09/17 0726   10/08/17 0700  vancomycin (VANCOCIN) 1,750 mg in sodium chloride 0.9 % 500 mL IVPB     1,750 mg 250 mL/hr over 120 Minutes Intravenous  Once 10/08/17 0645 10/08/17 1001   10/08/17 0630  piperacillin-tazobactam (ZOSYN) IVPB 3.375 g  Status:  Discontinued     3.375 g 100 mL/hr over 30 Minutes Intravenous  Once 10/08/17 0622 10/09/17 0731   10/08/17 0630  vancomycin (VANCOCIN) IVPB 1000 mg/200 mL premix  Status:  Discontinued     1,000 mg 200 mL/hr over 60 Minutes Intravenous  Once 10/08/17 0622 10/08/17 0645       Objective: Vitals:   10/13/17 0408 10/13/17 0500 10/13/17 0600 10/13/17 0800  BP: (!) 164/71 (!) 146/66 (!) 156/113   Pulse:  80 82   Resp: (!) 26 17 19    Temp:    98.4 F (36.9 C)  TempSrc:    Axillary  SpO2:  99% 98%   Weight:      Height:        Intake/Output Summary (Last 24 hours) at 10/13/2017 0851 Last data filed at 10/13/2017 0600 Gross per 24 hour  Intake 5221.67 ml  Output 2375 ml  Net 2846.67 ml   Filed Weights   10/10/17 1300 10/13/17 0353  Weight: 75.9 kg (167 lb 5.3 oz) 81.2 kg (179 lb 0.2 oz)    Examination:  General: NAD, nonverbal Cardiovascular: RRR, S1/S2 +,  Respiratory: Good air entry, bronchial sounds + trach Abdominal: Soft, nontender nondistended, positive bowel sounds, PEG tube in place Extremities: no edema  Data Reviewed: I have personally reviewed following labs and imaging studies  CBC: Recent Labs  Lab 10/09/17 0350 10/10/17 0322 10/11/17 0757 10/12/17 0109 10/13/17 0524  WBC 12.2* 15.5* 18.5* 15.3* 17.6*  NEUTROABS  --   --  14.1* 12.3*  --   HGB 9.7* 10.7* 11.1* 10.1* 9.4*  HCT 29.8* 33.9* 36.1 34.0* 29.4*  MCV 90.0 92.6 96.5 97.1 92.7  PLT 265 336 312 261 323   Basic Metabolic Panel: Recent Labs  Lab 10/11/17 1924 10/12/17 0109 10/12/17 0654 10/12/17 1706 10/13/17 0524  NA 165* 161* 153* 147*  147*  K 3.6 3.4* 3.4* 3.7 3.4*  CL >130* >130* 124* 121* 120*  CO2 17* 17* 15* 17* 16*  GLUCOSE 533* 346* 271* 244* 218*  BUN 158* 171* 169* 173* 148*  CREATININE 2.18* 2.34* 2.18* 2.01* 1.71*  CALCIUM 9.4 9.1 8.9 8.6* 8.6*   GFR: Estimated Creatinine Clearance: 37.6 mL/min (A) (by C-G formula based on SCr of 1.71 mg/dL (H)). Liver Function Tests: Recent Labs  Lab 10/08/17 0353  AST 35  ALT 26  ALKPHOS 45  BILITOT 0.5  PROT 8.0  ALBUMIN 2.6*   No results for input(s): LIPASE, AMYLASE in the last 168 hours. No results for input(s): AMMONIA in the last 168 hours. Coagulation Profile: Recent Labs  Lab 10/08/17 1931 10/09/17 0350 10/11/17 1504 10/12/17 0109 10/13/17 0524  INR 2.41 2.96 3.25 2.97 1.61  Cardiac Enzymes: No results for input(s): CKTOTAL, CKMB, CKMBINDEX, TROPONINI in the last 168 hours. BNP (last 3 results) No results for input(s): PROBNP in the last 8760 hours. HbA1C: No results for input(s): HGBA1C in the last 72 hours. CBG: Recent Labs  Lab 10/12/17 1203 10/12/17 1536 10/12/17 1918 10/12/17 2335 10/13/17 0339  GLUCAP 296* 230* 187* 255* 220*   Lipid Profile: No results for input(s): CHOL, HDL, LDLCALC, TRIG, CHOLHDL, LDLDIRECT in the last 72 hours. Thyroid Function Tests: No results for input(s): TSH, T4TOTAL, FREET4, T3FREE, THYROIDAB in the last 72 hours. Anemia Panel: No results for input(s): VITAMINB12, FOLATE, FERRITIN, TIBC, IRON, RETICCTPCT in the last 72 hours. Sepsis Labs: No results for input(s): PROCALCITON, LATICACIDVEN in the last 168 hours.  Recent Results (from the past 240 hour(s))  MRSA PCR Screening     Status: None   Collection Time: 10/08/17 11:36 AM  Result Value Ref Range Status   MRSA by PCR NEGATIVE NEGATIVE Final    Comment:        The GeneXpert MRSA Assay (FDA approved for NASAL specimens only), is one component of a comprehensive MRSA colonization surveillance program. It is not intended to diagnose  MRSA infection nor to guide or monitor treatment for MRSA infections. Performed at [facility], [place] [address] [place], [place] [address]   Urine culture     Status: Abnormal   Collection Time: 10/08/17 11:43 AM  Result Value Ref Range Status   Specimen Description   Final    URINE, RANDOM Performed at [place] [address] [place], [place] [address]    Special Requests   Final    NONE Performed at [facility], [place] [address], [place], [place] [address]    Culture (A)  Final    >=100,000 COLONIES/mL ESCHERICHIA COLI >=100,000 COLONIES/mL KLEBSIELLA PNEUMONIAE    Report Status 10/11/2017 FINAL  Final   Organism ID, Bacteria ESCHERICHIA COLI (A)  Final   Organism ID, Bacteria KLEBSIELLA PNEUMONIAE (A)  Final      Susceptibility   Escherichia coli - MIC*    AMPICILLIN >=32 RESISTANT Resistant     CEFAZOLIN 16 SENSITIVE Sensitive     CEFTRIAXONE <=1 SENSITIVE Sensitive     CIPROFLOXACIN >=4 RESISTANT Resistant     GENTAMICIN <=1 SENSITIVE Sensitive     IMIPENEM <=0.25 SENSITIVE Sensitive     NITROFURANTOIN <=16 SENSITIVE Sensitive     TRIMETH/SULFA <=20 SENSITIVE Sensitive     AMPICILLIN/SULBACTAM >=32 RESISTANT Resistant     PIP/TAZO 8 SENSITIVE Sensitive     Extended ESBL NEGATIVE Sensitive     * >=100,000 COLONIES/mL ESCHERICHIA COLI   Klebsiella pneumoniae - MIC*    AMPICILLIN >=32 RESISTANT Resistant     CEFAZOLIN <=4 SENSITIVE Sensitive     CEFTRIAXONE <=1 SENSITIVE Sensitive     CIPROFLOXACIN <=0.25 SENSITIVE Sensitive     GENTAMICIN <=1 SENSITIVE Sensitive     IMIPENEM <=0.25 SENSITIVE Sensitive     NITROFURANTOIN 128 RESISTANT Resistant     TRIMETH/SULFA <=20 SENSITIVE Sensitive     AMPICILLIN/SULBACTAM 16 INTERMEDIATE Intermediate     PIP/TAZO 8 SENSITIVE Sensitive     Extended ESBL NEGATIVE Sensitive     * >=100,000 COLONIES/mL KLEBSIELLA PNEUMONIAE      Radiology Studies: Dg Chest Port 1  View  Result Date: 10/12/2017 CLINICAL DATA:  Shortness of breath. EXAM: PORTABLE CHEST 1 VIEW COMPARISON:  Radiograph of October 08, 2017. FINDINGS: Tracheostomy is in good position.  Stable cardiomediastinal silhouette. No pneumothorax or pleural effusion is noted. Right lung is clear. Stable lingular opacity is noted concerning for atelectasis or scarring. Degenerative changes seen involving the right glenohumeral joint. IMPRESSION: Stable lingular opacity is noted concerning for atelectasis or scarring. No significant change compared to prior exam. Electronically Signed   By: Marijo Conception, M.D.   On: 10/12/2017 10:29   Dg Shoulder Left  Result Date: 10/11/2017 CLINICAL DATA:  Recent fall, nonverbal patient EXAM: LEFT SHOULDER - 2+ VIEW COMPARISON:  Chest x-ray of 10/08/2017 FINDINGS: There is degenerative change involving the acromion with some spurring present. However no fracture is seen. The left humeral head is in normal position and the glenohumeral joint space appears normal. The left clavicle appears intact. Tracheostomy is present. IMPRESSION: 1. Degenerative change of the acromion possibly leading to impingement. 2. No fracture. Electronically Signed   By: Ivar Drape M.D.   On: 10/11/2017 11:06    Scheduled Meds: . chlorhexidine  15 mL Mouth Rinse BID  . cloNIDine  0.2 mg Per Tube TID  . collagenase   Topical Daily  . feeding supplement (PRO-STAT SUGAR FREE 64)  30 mL Per Tube BID  . free water  200 mL Per Tube Q6H  . insulin aspart  0-15 Units Subcutaneous Q4H  . insulin aspart  2 Units Subcutaneous Q4H  . insulin glargine  40 Units Subcutaneous BID  . labetalol  400 mg Per Tube TID  . lacosamide  200 mg Per Tube BID  . levETIRAcetam  1,000 mg Per Tube BID  . mouth rinse  15 mL Mouth Rinse q12n4p  . nystatin  5 mL Oral QID  . pantoprazole (PROTONIX) IV  40 mg Intravenous Q12H  . phenytoin  250 mg Per Tube BID  . topiramate  200 mg Per Tube BID  . Warfarin - Pharmacist Dosing  Inpatient   Does not apply q1800   Continuous Infusions: . sodium chloride    . small volume/piggyback Museum/gallery curator    . dextrose 100 mL/hr at 10/12/17 1256  . feeding supplement (GLUCERNA 1.2 CAL) 1,000 mL (10/13/17 0443)     LOS: 5 days    Time spent: Total of 35 minutes spent with pt, greater than 50% of which was spent in discussion of  treatment, counseling and coordination of care   Chipper Oman, MD Pager: Text Page via [email or web address]   If 7PM-7AM, please contact night-coverage [email or web address] 10/13/2017, 8:51 AM   Note - This record has been created using [employer]. Chart creation errors have been sought, but may not always have been located. Such creation errors do not reflect on the standard of medical care.

## 2017-10-13 NOTE — Progress Notes (Signed)
ANTICOAGULATION CONSULT NOTE - follow up  Pharmacy Consult for warfarin Indication: h/o DVT  No Known Allergies  Patient Measurements: Height: 5\' 6"  (167.6 cm) Weight: 179 lb 0.2 oz (81.2 kg) IBW/kg (Calculated) : 59.3 Heparin Dosing Weight:   Vital Signs: Temp: 98.4 F (36.9 C) (04/06 0800) Temp Source: Axillary (04/06 0800) BP: 156/113 (04/06 0600) Pulse Rate: 83 (04/06 0911)  Labs: Recent Labs    10/11/17 0757  10/11/17 1504  10/12/17 0109 10/12/17 0654 10/12/17 1706 10/13/17 0524  HGB 11.1*  --   --   --  10.1*  --   --  9.4*  HCT 36.1  --   --   --  34.0*  --   --  29.4*  PLT 312  --   --   --  261  --   --  232  LABPROT  --   --  32.9*  --  30.7*  --   --  19.0*  INR  --   --  3.25  --  2.97  --   --  1.61  CREATININE 1.93*   < >  --    < > 2.34* 2.18* 2.01* 1.71*   < > = values in this interval not displayed.    Estimated Creatinine Clearance: 37.6 mL/min (A) (by C-G formula based on SCr of 1.71 mg/dL (H)).   Medical History: Past Medical History:  Diagnosis Date  . Acute deep vein thrombosis (DVT) of right upper extremity ([place]) 08/30/2017  . Chronic diastolic CHF (congestive heart failure) ([place]) 08/30/2017  . Cocaine abuse, continuous ([place])   . Diabetes mellitus    Hb A1C = 12.6 on 05/15/11, managed on Novolog 70/30, 35 U qam, 25 U qpm  . Hypertension    poorly controlled  . Insomnia disorder   . Shortness of breath   . Stroke [facility])     Assessment: 89 YOF presenting 4/1 with bleeding from tracheostomy site.  She was also found to have subcutaneous hematoma of R flank. She is on warfarin for history of DVT. She is on warfarin 8mg  PO daily prior to admission. Her last dose of warfarin was 3/31. Warfarin has been held since admission but Hgb/Hct remains stable, so pharmacy asked to resume.  Today, 10/13/2017  INR has fallen to SUBtherapeutic levels.  Large decrease overnight (did not anticipate degree of drop based on recent INR trend)  CBC: Hgb  decreasing (? Dilutional from large volumes of free water to treat hyperNa+), pltc WNL  No known active bleeding  Goal of Therapy:  INR 2-3, prefer 2-2.5 with recent bleeding events  Plan:   Warfarin 4mg  PO x 1 tonight (will use 1/2 normal dose due to prolonged period of time INR remained > 2 without warfarin  Daily INR  Monitor for bleeding  Doreene Eland, PharmD, BCPS.   Pager: [phone] 10/13/2017 10:03 AM

## 2017-10-13 NOTE — NC FL2 (Signed)
[place] LEVEL OF CARE SCREENING TOOL     IDENTIFICATION  Patient Name: [name] Birthdate: April 08, 1956 Sex: female Admission Date (Current Location): 10/08/2017  [facility] [place] Number:  Herbalist and Address:  [facility],  [place] [address] [place]      Provider Number:    Attending Physician Name and Address:  Patrecia Pour, Christean Grief, MD  Relative Name and Phone Number:  Royston Bake, [phone]    Current Level of Care: Hospital Recommended Level of Care: Skilled Nursing Facility(pt will return) Prior Approval Number:    Date Approved/Denied:   [place] Number: [id number]  Discharge Plan: SNF    Current Diagnoses: Patient Active Problem List   Diagnosis Date Noted  . Symptomatic anemia 10/08/2017  . Sepsis ([place]) 10/08/2017  . Acute bilat watershed infarction [facility])   . Acute respiratory failure with hypoxia ([place])   . Essential hypertension   . Diabetes mellitus type 2, uncontrolled, with complications ([place])   . Chronic diastolic CHF (congestive heart failure) ([place]) 08/30/2017  . Acute deep vein thrombosis (DVT) of right upper extremity ([place]) 08/30/2017  . Tracheostomy status ([place])   . Acute respiratory failure with hypoxemia ([place])   . Pressure injury of skin 08/04/2017  . Altered mental status   . H/O open leg wound 06/04/2017  . Open leg wound 06/04/2017  . Acute metabolic encephalopathy 74/02/1447  . Intracranial bleed ([place]) 05/26/2017  . ICH (intracerebral hemorrhage) ([place]) 05/26/2017  . Type II diabetes mellitus with renal manifestations ([place]) 05/25/2017  . SIRS (systemic inflammatory response syndrome) ([place]) 05/25/2017  . Aspiration pneumonia due to gastric secretions ([place])   . Confusion 05/10/2017  . Chronic hepatitis C without hepatic coma ([place])   . Labile blood glucose   . Hypoglycemia associated with type 2 diabetes mellitus ([place])   . Poorly controlled type 2 diabetes mellitus with  peripheral neuropathy ([place])   . Neurologic gait disorder   . Neuropathic pain   . Type 2 diabetes mellitus with peripheral neuropathy (HCC)   . History of intracranial hemorrhage   . Diarrhea   . Urinary incontinence   . Hypokalemia   . Polysubstance abuse ([place])   . Cerebrovascular accident (CVA) ([place])   . Disorientation   . Meningitis 11/02/2016  . Type 2 diabetes mellitus with hyperosmolar nonketotic hyperglycemia ([place]) 10/31/2016  . Seizure ([name]) 10/31/2016  . ARF (acute renal failure) ([place]) 10/31/2016  . Severe sepsis ([place]) 10/31/2016  . DKA (diabetic ketoacidoses) ([place]) 10/31/2016  . Acute renal failure superimposed on stage 3 chronic kidney disease ([place])   . Right hemiparesis ([place]) 06/09/2016  . 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 Cytotoxic brain edema ([place]) 06/08/2016  . Encephalopathy acute 06/06/2016  . Chest pain, musculoskeletal 05/16/2011  . Vaginal candidiasis 05/16/2011  . Diabetes mellitus, type 2 ([place]) 05/16/2011  . CHEST PAIN 10/01/2009  . CELLULITIS AND ABSCESS OF LEG EXCEPT FOOT 08/31/2009  . INSOMNIA 07/13/2008  . MRSA 04/17/2008  . NECK MASS 04/17/2008  . COCAINE ABUSE 11/01/2007  . OBESITY NOS 07/14/2006  . DENTAL CARIES 07/14/2006  . DIABETES MELLITUS, TYPE II 04/26/2006  . Alcohol abuse 04/26/2006  . TOBACCO ABUSE 04/26/2006  . DEPRESSION 04/26/2006  . Uncontrolled hypertension 04/26/2006  . GERD 04/26/2006  . LOW BACK PAIN 04/26/2006    Orientation RESPIRATION BLADDER Height & Weight     (responds to pain/does not follow command)  Tracheostomy Incontinent Weight: 179 lb 0.2 oz (81.2 kg) Height:  5\' 6"  (  167.6 cm)  BEHAVIORAL SYMPTOMS/MOOD NEUROLOGICAL BOWEL NUTRITION STATUS      Incontinent Diet(has peg tube/ see dc summary for diet)  AMBULATORY STATUS COMMUNICATION OF NEEDS Skin   Total Care                           Personal Care Assistance Level of Assistance  Bathing, Feeding, Dressing Bathing Assistance: Maximum  assistance Feeding assistance: Maximum assistance(pt has a peg tube) Dressing Assistance: Maximum assistance     Functional Limitations Info  Sight, Hearing, Speech Sight Info: Adequate Hearing Info: Adequate Speech Info: Impaired(per dtr statement, pt has not talk in month)    [place]  PT (By licensed PT), OT (By licensed OT)     PT Frequency: 2xwk OT Frequency: 2xwk            Contractures      Additional Factors Info  Code Status, Allergies Code Status Info: Full Code Allergies Info: No Known Allergies           Current Medications (10/13/2017):  This is the current hospital active medication list Current Facility-Administered Medications  Medication Dose Route Frequency Provider Last Rate Last Dose  . 0.45 % sodium chloride infusion   Intravenous Continuous Patrecia Pour, Christean Grief, MD      . 0.9 %  sodium chloride infusion   Intravenous Once Tamala Julian, Rondell A, MD      . albuterol (PROVENTIL) (2.5 MG/3ML) 0.083% nebulizer solution 2.5 mg  2.5 mg Nebulization Q2H PRN Georgette Shell, MD      . cefTRIAXone (ROCEPHIN) 1 g in dextrose 5 % 50 mL injection   Intravenous Q24H Patrecia Pour, Christean Grief, MD      . chlorhexidine (PERIDEX) 0.12 % solution 15 mL  15 mL Mouth Rinse BID Georgette Shell, MD   15 mL at 10/12/17 2108  . cloNIDine (CATAPRES) tablet 0.2 mg  0.2 mg Per Tube TID Patrecia Pour, Christean Grief, MD   0.2 mg at 10/12/17 2109  . collagenase (SANTYL) ointment   Topical Daily Georgette Shell, MD      . feeding supplement (GLUCERNA 1.2 CAL) liquid 1,000 mL  1,000 mL Per Tube Continuous Patrecia Pour, Christean Grief, MD 70 mL/hr at 10/13/17 0443 1,000 mL at 10/13/17 0443  . feeding supplement (PRO-STAT SUGAR FREE 64) liquid 30 mL  30 mL Per Tube BID Patrecia Pour, Christean Grief, MD   30 mL at 10/12/17 2108  . free water 200 mL  200 mL Per Tube Q6H Patrecia Pour, Christean Grief, MD   200 mL at 10/13/17 0100  . hydrALAZINE (APRESOLINE) injection 5 mg  5 mg Intravenous Q4H PRN  Dessa Phi, DO   5 mg at 10/11/17 0834  . insulin aspart (novoLOG) injection 0-15 Units  0-15 Units Subcutaneous Q4H Fuller Plan A, MD   5 Units at 10/13/17 0355  . insulin aspart (novoLOG) injection 2 Units  2 Units Subcutaneous Q4H Dessa Phi, DO   2 Units at 10/13/17 0355  . insulin glargine (LANTUS) injection 40 Units  40 Units Subcutaneous BID Patrecia Pour, Christean Grief, MD   40 Units at 10/12/17 2209  . labetalol (NORMODYNE) tablet 400 mg  400 mg Per Tube TID Patrecia Pour, Christean Grief, MD   400 mg at 10/12/17 2109  . lacosamide (VIMPAT) tablet 200 mg  200 mg Per Tube BID Georgette Shell, MD   200 mg at 10/12/17 2109  . levETIRAcetam (KEPPRA) 100 MG/ML solution 1,000 mg  1,000 mg Per Tube BID Georgette Shell, MD   1,000 mg at 10/12/17 2108  . MEDLINE mouth rinse  15 mL Mouth Rinse q12n4p Georgette Shell, MD   15 mL at 10/12/17 1538  . nystatin (MYCOSTATIN) 100000 UNIT/ML suspension 500,000 Units  5 mL Oral QID Georgette Shell, MD   500,000 Units at 10/12/17 2107  . pantoprazole (PROTONIX) injection 40 mg  40 mg Intravenous Q12H Fuller Plan A, MD   40 mg at 10/12/17 2108  . phenytoin (DILANTIN) 125 MG/5ML suspension 250 mg  250 mg Per Tube BID Minda Ditto, RPH   250 mg at 10/12/17 2108  . topiramate (TOPAMAX) tablet 200 mg  200 mg Per Tube BID Georgette Shell, MD   200 mg at 10/12/17 2109  . Warfarin - Pharmacist Dosing Inpatient   Does not apply q1800 Adrian Saran [place] at 10/11/17 1800     Discharge Medications: Please see discharge summary for a list of discharge medications.  Relevant Imaging Results:  Relevant Lab Results:   Additional Information SSN [id number]. Pt had trach placed on 08/15/17  Wende Neighbors, LCSW

## 2017-10-14 ENCOUNTER — Inpatient Hospital Stay (HOSPITAL_COMMUNITY): Payer: Medicaid Other

## 2017-10-14 LAB — GLUCOSE, CAPILLARY
GLUCOSE-CAPILLARY: 121 mg/dL — AB (ref 65–99)
GLUCOSE-CAPILLARY: 128 mg/dL — AB (ref 65–99)
GLUCOSE-CAPILLARY: 132 mg/dL — AB (ref 65–99)
GLUCOSE-CAPILLARY: 210 mg/dL — AB (ref 65–99)
GLUCOSE-CAPILLARY: 63 mg/dL — AB (ref 65–99)
Glucose-Capillary: 107 mg/dL — ABNORMAL HIGH (ref 65–99)
Glucose-Capillary: 159 mg/dL — ABNORMAL HIGH (ref 65–99)
Glucose-Capillary: 164 mg/dL — ABNORMAL HIGH (ref 65–99)

## 2017-10-14 LAB — CBC
HCT: 30.7 % — ABNORMAL LOW (ref 36.0–46.0)
Hemoglobin: 9.6 g/dL — ABNORMAL LOW (ref 12.0–15.0)
MCH: 29.1 pg (ref 26.0–34.0)
MCHC: 31.3 g/dL (ref 30.0–36.0)
MCV: 93 fL (ref 78.0–100.0)
PLATELETS: 214 10*3/uL (ref 150–400)
RBC: 3.3 MIL/uL — AB (ref 3.87–5.11)
RDW: 18.3 % — ABNORMAL HIGH (ref 11.5–15.5)
WBC: 16.8 10*3/uL — ABNORMAL HIGH (ref 4.0–10.5)

## 2017-10-14 LAB — BASIC METABOLIC PANEL
ANION GAP: 10 (ref 5–15)
BUN: 123 mg/dL — ABNORMAL HIGH (ref 6–20)
CALCIUM: 9 mg/dL (ref 8.9–10.3)
CO2: 14 mmol/L — ABNORMAL LOW (ref 22–32)
Chloride: 126 mmol/L — ABNORMAL HIGH (ref 101–111)
Creatinine, Ser: 1.44 mg/dL — ABNORMAL HIGH (ref 0.44–1.00)
GFR, EST AFRICAN AMERICAN: 45 mL/min — AB (ref >60)
GFR, EST NON AFRICAN AMERICAN: 39 mL/min — AB (ref >60)
GLUCOSE: 145 mg/dL — AB (ref 65–99)
Potassium: 4.3 mmol/L (ref 3.5–5.1)
SODIUM: 150 mmol/L — AB (ref 135–145)

## 2017-10-14 LAB — C DIFFICILE QUICK SCREEN W PCR REFLEX
C Diff antigen: NEGATIVE
C Diff interpretation: NOT DETECTED
C Diff toxin: NEGATIVE

## 2017-10-14 LAB — PROTIME-INR
INR: 1.43
Prothrombin Time: 17.3 seconds — ABNORMAL HIGH (ref 11.4–15.2)

## 2017-10-14 LAB — PHOSPHORUS: PHOSPHORUS: 5.6 mg/dL — AB (ref 2.5–4.6)

## 2017-10-14 LAB — MAGNESIUM: MAGNESIUM: 1.9 mg/dL (ref 1.7–2.4)

## 2017-10-14 MED ORDER — DEXTROSE 50 % IV SOLN
INTRAVENOUS | Status: AC
  Administered 2017-10-14: 25 mL
  Filled 2017-10-14: qty 50

## 2017-10-14 MED ORDER — DIPHENOXYLATE-ATROPINE 2.5-0.025 MG/5ML PO LIQD
5.0000 mL | Freq: Four times a day (QID) | ORAL | Status: DC | PRN
  Administered 2017-10-14: 5 mL
  Filled 2017-10-14: qty 5

## 2017-10-14 MED ORDER — WARFARIN SODIUM 4 MG PO TABS
8.0000 mg | ORAL_TABLET | Freq: Once | ORAL | Status: AC
  Administered 2017-10-14: 8 mg via ORAL
  Filled 2017-10-14: qty 2

## 2017-10-14 MED ORDER — SODIUM BICARBONATE 8.4 % IV SOLN
INTRAVENOUS | Status: AC
  Administered 2017-10-14: 09:00:00 via INTRAVENOUS
  Filled 2017-10-14: qty 50

## 2017-10-14 MED ORDER — SODIUM CHLORIDE 0.45 % IV SOLN
INTRAVENOUS | Status: DC
  Administered 2017-10-14: 21:00:00 via INTRAVENOUS

## 2017-10-14 MED ORDER — AMLODIPINE BESYLATE 10 MG PO TABS
10.0000 mg | ORAL_TABLET | Freq: Every day | ORAL | Status: DC
  Administered 2017-10-14 – 2017-10-18 (×5): 10 mg
  Filled 2017-10-14 (×5): qty 1

## 2017-10-14 NOTE — Progress Notes (Signed)
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  Patient presented from nursing home with complaints of bleeding from tracheostomy site.  Patient had 200-250 cc of bleeding from tracheostomy at the nursing facility.  Upon ED evaluation bleeding has stopped.  CT scan of the abdomen and pelvis was done show probably subcutaneous hematoma in the low right flank and overlying the right iliac wing measuring approximately 2.7-3 cm.  Patient found to be slightly anemic and was admitted for further workup.  She was given FFP and transfused 2 PRBCs.  Also found to have UTI and she was started on broad-spectrum antibiotics.  Subjective: Patient seen and examined, no significant changes in mental status or physical activity.  Per nursing staff patient has increasing secretions.  Assessment & Plan: Acute blood loss anemia secondary to subcutaneous hematoma of the right flank/pelvic area No overt bleeding, FOBT negative.  Hemoglobin remains stable.  Subcutaneous hematoma seems to be decreasing.  Continue to monitor closely. Patient is status post 2 units of PRBCs and 1 unit of FFP  Sepsis secondary to E. coli UTI - remains afebrile Initially treated with vancomycin and Zosyn now being treated with Rocephin Urine culture shows E. coli and Klebsiella, MDR but sensitive to ceftriaxone. Last day of ceftriaxone, day #7  Hypernatremia - sodium continues to improve Continue with insensible losses, diarrhea, water deficit about 3 L IV fluids changes to D5 with bicarb given metabolic acidosis. Continue free  water intake to 200 cc every 6 hours Urine osmolality 493 Monitor BMP daily  Hypertension BP remains above goal, but much improved Will continue labetalol and clonidine since patient lisinopril has continued to be on hold, increase amlodipine to 10 mg Continue hydralazine as needed and monitor BP  AKI on CKD stage III/Metabolic acidosis with hyperchloremia ? RTA  Baseline creatinine 1.3 Cr continues to improve almost back to baseline. Metabolic acidosis due to diarrhea,  Bicarb/D5W @ 100 cc x 12 hrs   Seizure disorders - no seizing events Continue Vimpat, Keppra, Topamax Seizure precautions  Status post trach/PEG Trach care per track team, suction as needed, continue feeding tubes per RD  Diarrhea  C diff negative  Will add Lomotil  Coccyx unstageable wound POA Wound care per RN protocol  DVT prophylaxis: SCDs Code Status: Full Code Family Communication: None at bedside  Disposition Plan: Back to SNF when    Consultants:   None   Procedures:   None  Antimicrobials: Anti-infectives (From admission, onward)   Start     Dose/Rate Route Frequency Ordered Stop   10/13/17 1400  cefTRIAXone (ROCEPHIN) 1 g in dextrose 5 % 50 mL injection     100 mL/hr over 30 Minutes Intravenous Every 24 hours 10/13/17 0613     10/10/17 1400  cefTRIAXone (ROCEPHIN) 1 g in dextrose 5 % 50 mL injection  Status:  Discontinued     100 mL/hr over 30 Minutes Intravenous Every 24 hours 10/10/17 1041 10/13/17 0611   10/10/17 1045  cefTRIAXone (ROCEPHIN) 1 g in sodium chloride 0.9 % 100 mL IVPB  Status:  Discontinued     1 g 200 mL/hr over 30 Minutes Intravenous Every 24  hours 10/10/17 1039 10/10/17 1041   10/09/17 1400  cefTRIAXone (ROCEPHIN) 1 g in sodium chloride 0.9 % 100 mL IVPB  Status:  Discontinued     1 g 200 mL/hr over 30 Minutes Intravenous Every 24 hours 10/09/17 0726 10/10/17 1039   10/08/17 1200  piperacillin-tazobactam (ZOSYN) IVPB 2.25 g  Status:  Discontinued     2.25 g 100  mL/hr over 30 Minutes Intravenous Every 6 hours 10/08/17 0646 10/09/17 0726   10/08/17 0700  vancomycin (VANCOCIN) 1,750 mg in sodium chloride 0.9 % 500 mL IVPB     1,750 mg 250 mL/hr over 120 Minutes Intravenous  Once 10/08/17 0645 10/08/17 1001   10/08/17 0630  piperacillin-tazobactam (ZOSYN) IVPB 3.375 g  Status:  Discontinued     3.375 g 100 mL/hr over 30 Minutes Intravenous  Once 10/08/17 0622 10/09/17 0731   10/08/17 0630  vancomycin (VANCOCIN) IVPB 1000 mg/200 mL premix  Status:  Discontinued     1,000 mg 200 mL/hr over 60 Minutes Intravenous  Once 10/08/17 0622 10/08/17 0645       Objective: Vitals:   10/14/17 0600 10/14/17 0700 10/14/17 0800 10/14/17 0825  BP: (!) 154/57 (!) 165/86 (!) 166/82   Pulse: (!) 101 100 (!) 103   Resp: (!) 29 (!) 33 (!) 30   Temp:   99.7 F (37.6 C)   TempSrc:   Axillary   SpO2: 100% 100% 99% 100%  Weight:      Height:        Intake/Output Summary (Last 24 hours) at 10/14/2017 0839 Last data filed at 10/14/2017 0653 Gross per 24 hour  Intake 2812.5 ml  Output 3650 ml  Net -837.5 ml   Filed Weights   10/13/17 0353 10/14/17 0408 10/14/17 0517  Weight: 81.2 kg (179 lb 0.2 oz) 78.4 kg (172 lb 13.5 oz) 79.2 kg (174 lb 9.7 oz)    Examination:  General: NAD, non verbal  Cardiovascular: RRR, S1/S2 + Respiratory: Good air entry, diffuse rhonchi, no wheezing  Abdominal: Soft, NT, ND, + PEG, Rectal tube in place, watery diarrhea  GU: Foley cath in place, yellow urine  Extremities: no edema  Data Reviewed: I have personally reviewed following labs and imaging studies  CBC: Recent Labs  Lab 10/10/17 0322 10/11/17 0757 10/12/17 0109 10/13/17 0524 10/14/17 0356  WBC 15.5* 18.5* 15.3* 17.6* 16.8*  NEUTROABS  --  14.1* 12.3*  --   --   HGB 10.7* 11.1* 10.1* 9.4* 9.6*  HCT 33.9* 36.1 34.0* 29.4* 30.7*  MCV 92.6 96.5 97.1 92.7 93.0  PLT 336 312 261 232 315   Basic Metabolic Panel: Recent Labs  Lab 10/12/17 0109 10/12/17 0654  10/12/17 1706 10/13/17 0524 10/14/17 0401  NA 161* 153* 147* 147* 150*  K 3.4* 3.4* 3.7 3.4* 4.3  CL >130* 124* 121* 120* 126*  CO2 17* 15* 17* 16* 14*  GLUCOSE 346* 271* 244* 218* 145*  BUN 171* 169* 173* 148* 123*  CREATININE 2.34* 2.18* 2.01* 1.71* 1.44*  CALCIUM 9.1 8.9 8.6* 8.6* 9.0  MG  --   --   --   --  1.9  PHOS  --   --   --   --  5.6*   GFR: Estimated Creatinine Clearance: 44.1 mL/min (A) (by C-G formula based on SCr of 1.44 mg/dL (H)). Liver Function Tests: Recent Labs  Lab 10/08/17 0353  AST 35  ALT 26  ALKPHOS 45  BILITOT 0.5  PROT 8.0  ALBUMIN 2.6*   No  results for input(s): LIPASE, AMYLASE in the last 168 hours. No results for input(s): AMMONIA in the last 168 hours. Coagulation Profile: Recent Labs  Lab 10/09/17 0350 10/11/17 1504 10/12/17 0109 10/13/17 0524 10/14/17 0401  INR 2.96 3.25 2.97 1.61 1.43   Cardiac Enzymes: No results for input(s): CKTOTAL, CKMB, CKMBINDEX, TROPONINI in the last 168 hours. BNP (last 3 results) No results for input(s): PROBNP in the last 8760 hours. HbA1C: No results for input(s): HGBA1C in the last 72 hours. CBG: Recent Labs  Lab 10/13/17 2012 10/14/17 0004 10/14/17 0031 10/14/17 0401 10/14/17 0734  GLUCAP 85 63* 121* 128* 164*   Lipid Profile: No results for input(s): CHOL, HDL, LDLCALC, TRIG, CHOLHDL, LDLDIRECT in the last 72 hours. Thyroid Function Tests: No results for input(s): TSH, T4TOTAL, FREET4, T3FREE, THYROIDAB in the last 72 hours. Anemia Panel: No results for input(s): VITAMINB12, FOLATE, FERRITIN, TIBC, IRON, RETICCTPCT in the last 72 hours. Sepsis Labs: No results for input(s): PROCALCITON, LATICACIDVEN in the last 168 hours.  Recent Results (from the past 240 hour(s))  MRSA PCR Screening     Status: None   Collection Time: 10/08/17 11:36 AM  Result Value Ref Range Status   MRSA by PCR NEGATIVE NEGATIVE Final    Comment:        The GeneXpert MRSA Assay (FDA approved for NASAL  specimens only), is one component of a comprehensive MRSA colonization surveillance program. It is not intended to diagnose MRSA infection nor to guide or monitor treatment for MRSA infections. Performed at [facility], [place] [address], [place], [place] [address]   Urine culture     Status: Abnormal   Collection Time: 10/08/17 11:43 AM  Result Value Ref Range Status   Specimen Description   Final    URINE, RANDOM Performed at [place] [address] [place], [place] [address]    Special Requests   Final    NONE Performed at [facility], [place] [address] [place], [place] [address]    Culture (A)  Final    >=100,000 COLONIES/mL ESCHERICHIA COLI >=100,000 COLONIES/mL KLEBSIELLA PNEUMONIAE    Report Status 10/11/2017 FINAL  Final   Organism ID, Bacteria ESCHERICHIA COLI (A)  Final   Organism ID, Bacteria KLEBSIELLA PNEUMONIAE (A)  Final      Susceptibility   Escherichia coli - MIC*    AMPICILLIN >=32 RESISTANT Resistant     CEFAZOLIN 16 SENSITIVE Sensitive     CEFTRIAXONE <=1 SENSITIVE Sensitive     CIPROFLOXACIN >=4 RESISTANT Resistant     GENTAMICIN <=1 SENSITIVE Sensitive     IMIPENEM <=0.25 SENSITIVE Sensitive     NITROFURANTOIN <=16 SENSITIVE Sensitive     TRIMETH/SULFA <=20 SENSITIVE Sensitive     AMPICILLIN/SULBACTAM >=32 RESISTANT Resistant     PIP/TAZO 8 SENSITIVE Sensitive     Extended ESBL NEGATIVE Sensitive     * >=100,000 COLONIES/mL ESCHERICHIA COLI   Klebsiella pneumoniae - MIC*    AMPICILLIN >=32 RESISTANT Resistant     CEFAZOLIN <=4 SENSITIVE Sensitive     CEFTRIAXONE <=1 SENSITIVE Sensitive     CIPROFLOXACIN <=0.25 SENSITIVE Sensitive     GENTAMICIN <=1 SENSITIVE Sensitive     IMIPENEM <=0.25 SENSITIVE Sensitive     NITROFURANTOIN 128 RESISTANT Resistant     TRIMETH/SULFA <=20 SENSITIVE Sensitive     AMPICILLIN/SULBACTAM 16 INTERMEDIATE Intermediate     PIP/TAZO 8 SENSITIVE Sensitive      Extended ESBL NEGATIVE Sensitive     * >=  100,000 COLONIES/mL KLEBSIELLA PNEUMONIAE      Radiology Studies: Dg Chest Port 1 View  Result Date: 10/12/2017 CLINICAL DATA:  Shortness of breath. EXAM: PORTABLE CHEST 1 VIEW COMPARISON:  Radiograph of October 08, 2017. FINDINGS: Tracheostomy is in good position. Stable cardiomediastinal silhouette. No pneumothorax or pleural effusion is noted. Right lung is clear. Stable lingular opacity is noted concerning for atelectasis or scarring. Degenerative changes seen involving the right glenohumeral joint. IMPRESSION: Stable lingular opacity is noted concerning for atelectasis or scarring. No significant change compared to prior exam. Electronically Signed   By: Marijo Conception, M.D.   On: 10/12/2017 10:29    Scheduled Meds: . amLODipine  5 mg Per Tube Daily  . chlorhexidine  15 mL Mouth Rinse BID  . cloNIDine  0.2 mg Per Tube TID  . collagenase   Topical Daily  . feeding supplement (PRO-STAT SUGAR FREE 64)  30 mL Per Tube BID  . free water  200 mL Per Tube Q6H  . insulin aspart  0-15 Units Subcutaneous Q4H  . insulin aspart  2 Units Subcutaneous Q4H  . insulin glargine  40 Units Subcutaneous BID  . labetalol  400 mg Per Tube TID  . lacosamide  200 mg Per Tube BID  . levETIRAcetam  1,000 mg Per Tube BID  . mouth rinse  15 mL Mouth Rinse q12n4p  . nystatin  5 mL Oral QID  . pantoprazole (PROTONIX) IV  40 mg Intravenous Q12H  . phenytoin  250 mg Per Tube BID  . topiramate  200 mg Per Tube BID  . warfarin  8 mg Oral ONCE-1800  . Warfarin - Pharmacist Dosing Inpatient   Does not apply q1800   Continuous Infusions: . small volume/piggyback builder Stopped (10/13/17 1400)  . feeding supplement (GLUCERNA 1.2 CAL) 70 mL/hr at 10/14/17 0010  .  sodium bicarbonate  infusion 1000 mL       LOS: 6 days    Time spent: Total of 35 minutes spent with pt, greater than 50% of which was spent in discussion of  treatment, counseling and coordination of  care   Chipper Oman, MD Pager: Text Page via [email or web address]   If 7PM-7AM, please contact night-coverage [email or web address] 10/14/2017, 8:39 AM   Note - This record has been created using [employer]. Chart creation errors have been sought, but may not always have been located. Such creation errors do not reflect on the standard of medical care.

## 2017-10-14 NOTE — Progress Notes (Signed)
ANTICOAGULATION CONSULT NOTE - follow up  Pharmacy Consult for warfarin Indication: h/o DVT  No Known Allergies  Patient Measurements: Height: 5\' 6"  (167.6 cm) Weight: 174 lb 9.7 oz (79.2 kg) IBW/kg (Calculated) : 59.3 Heparin Dosing Weight:   Vital Signs: Temp: 99.7 F (37.6 C) (04/07 0800) Temp Source: Axillary (04/07 0800) BP: 154/57 (04/07 0600) Pulse Rate: 101 (04/07 0600)  Labs: Recent Labs    10/12/17 0109  10/12/17 1706 10/13/17 0524 10/14/17 0401  HGB 10.1*  --   --  9.4*  --   HCT 34.0*  --   --  29.4*  --   PLT 261  --   --  232  --   LABPROT 30.7*  --   --  19.0* 17.3*  INR 2.97  --   --  1.61 1.43  CREATININE 2.34*   < > 2.01* 1.71* 1.44*   < > = values in this interval not displayed.    Estimated Creatinine Clearance: 44.1 mL/min (A) (by C-G formula based on SCr of 1.44 mg/dL (H)).   Medical History: Past Medical History:  Diagnosis Date  . Acute deep vein thrombosis (DVT) of right upper extremity ([place]) 08/30/2017  . Chronic diastolic CHF (congestive heart failure) ([place]) 08/30/2017  . Cocaine abuse, continuous ([place])   . Diabetes mellitus    Hb A1C = 12.6 on 05/15/11, managed on Novolog 70/30, 35 U qam, 25 U qpm  . Hypertension    poorly controlled  . Insomnia disorder   . Shortness of breath   . Stroke [facility])     Assessment: 66 YOF presenting 4/1 with bleeding from tracheostomy site.  She was also found to have subcutaneous hematoma of R flank. She is on warfarin for history of DVT. She is on warfarin 8mg  PO daily prior to admission. Her last dose of warfarin was 3/31. Warfarin has been held since admission but Hgb/Hct remains stable, so pharmacy asked to resume.  Today, 10/14/2017  INR SUBtherapeutic.  Warfarin re-initiated 4/6PM  CBC: no CBC available this am.  Hgb decreasing (? Dilutional from large volumes of free water to treat hyperNa+), pltc WNL  No known active bleeding  Goal of Therapy:  INR 2-3, prefer 2-2.5 with recent bleeding  events  Plan:   Warfarin 8mg  PO x 1 tonight as INR trending down  Started with conservative dose 4/6 for prolonged period of time w/o requiring warfarin dosing and INR remaining > 2.   Daily INR  Monitor for bleeding  Doreene Eland, PharmD, BCPS.   Pager: [phone] 10/14/2017 8:23 AM

## 2017-10-15 LAB — BASIC METABOLIC PANEL
Anion gap: 10 (ref 5–15)
BUN: 99 mg/dL — AB (ref 6–20)
CALCIUM: 9.2 mg/dL (ref 8.9–10.3)
CO2: 16 mmol/L — ABNORMAL LOW (ref 22–32)
Chloride: 127 mmol/L — ABNORMAL HIGH (ref 101–111)
Creatinine, Ser: 1.28 mg/dL — ABNORMAL HIGH (ref 0.44–1.00)
GFR calc Af Amer: 52 mL/min — ABNORMAL LOW (ref >60)
GFR, EST NON AFRICAN AMERICAN: 45 mL/min — AB (ref >60)
GLUCOSE: 93 mg/dL (ref 65–99)
Potassium: 4.2 mmol/L (ref 3.5–5.1)
Sodium: 153 mmol/L — ABNORMAL HIGH (ref 135–145)

## 2017-10-15 LAB — PROTIME-INR
INR: 1.53
Prothrombin Time: 18.3 seconds — ABNORMAL HIGH (ref 11.4–15.2)

## 2017-10-15 LAB — CBC WITH DIFFERENTIAL/PLATELET
Basophils Absolute: 0 10*3/uL (ref 0.0–0.1)
Basophils Relative: 0 %
Eosinophils Absolute: 0.6 10*3/uL (ref 0.0–0.7)
Eosinophils Relative: 4 %
HEMATOCRIT: 28.3 % — AB (ref 36.0–46.0)
Hemoglobin: 8.9 g/dL — ABNORMAL LOW (ref 12.0–15.0)
LYMPHS ABS: 2.9 10*3/uL (ref 0.7–4.0)
LYMPHS PCT: 19 %
MCH: 29.6 pg (ref 26.0–34.0)
MCHC: 31.4 g/dL (ref 30.0–36.0)
MCV: 94 fL (ref 78.0–100.0)
MONO ABS: 0.7 10*3/uL (ref 0.1–1.0)
MONOS PCT: 4 %
NEUTROS ABS: 11.3 10*3/uL — AB (ref 1.7–7.7)
Neutrophils Relative %: 73 %
Platelets: 192 10*3/uL (ref 150–400)
RBC: 3.01 MIL/uL — ABNORMAL LOW (ref 3.87–5.11)
RDW: 18.5 % — AB (ref 11.5–15.5)
WBC: 15.4 10*3/uL — ABNORMAL HIGH (ref 4.0–10.5)

## 2017-10-15 LAB — GLUCOSE, CAPILLARY
GLUCOSE-CAPILLARY: 80 mg/dL (ref 65–99)
GLUCOSE-CAPILLARY: 98 mg/dL (ref 65–99)
Glucose-Capillary: 140 mg/dL — ABNORMAL HIGH (ref 65–99)
Glucose-Capillary: 109 mg/dL — ABNORMAL HIGH (ref 65–99)
Glucose-Capillary: 172 mg/dL — ABNORMAL HIGH (ref 65–99)

## 2017-10-15 LAB — PHOSPHORUS: Phosphorus: 5.3 mg/dL — ABNORMAL HIGH (ref 2.5–4.6)

## 2017-10-15 LAB — MAGNESIUM: Magnesium: 1.8 mg/dL (ref 1.7–2.4)

## 2017-10-15 MED ORDER — PHENYTOIN 125 MG/5ML PO SUSP
250.0000 mg | Freq: Two times a day (BID) | ORAL | Status: DC
  Administered 2017-10-16 – 2017-10-18 (×5): 250 mg
  Filled 2017-10-15 (×6): qty 10

## 2017-10-15 MED ORDER — WARFARIN SODIUM 4 MG PO TABS: 8.0000 mg | ORAL_TABLET | Freq: Once | ORAL | Status: DC

## 2017-10-15 MED ORDER — SODIUM BICARBONATE 8.4 % IV SOLN
INTRAVENOUS | Status: AC
  Administered 2017-10-15 – 2017-10-16 (×2): via INTRAVENOUS
  Filled 2017-10-15 (×2): qty 50

## 2017-10-15 MED ORDER — WARFARIN SODIUM 4 MG PO TABS
8.0000 mg | ORAL_TABLET | Freq: Once | ORAL | Status: AC
  Administered 2017-10-15: 8 mg
  Filled 2017-10-15: qty 2

## 2017-10-15 NOTE — Plan of Care (Signed)
  Problem: Nutrition: Goal: Adequate nutrition will be maintained Outcome: Progressing   Problem: Elimination: Goal: Will not experience complications related to bowel motility Outcome: Progressing   

## 2017-10-15 NOTE — Progress Notes (Signed)
ANTICOAGULATION CONSULT NOTE - follow up  Pharmacy Consult for warfarin Indication: h/o DVT  No Known Allergies  Patient Measurements: Height: 5\' 6"  (167.6 cm) Weight: 176 lb 9.4 oz (80.1 kg) IBW/kg (Calculated) : 59.3  Vital Signs: Temp: 99.2 F (37.3 C) (04/08 0800) Temp Source: Axillary (04/08 0800) BP: 154/69 (04/08 0600) Pulse Rate: 87 (04/08 0600)  Labs: Recent Labs    10/13/17 0524 10/14/17 0356 10/14/17 0401 10/15/17 0345  HGB 9.4* 9.6*  --  8.9*  HCT 29.4* 30.7*  --  28.3*  PLT 232 214  --  192  LABPROT 19.0*  --  17.3* 18.3*  INR 1.61  --  1.43 1.53  CREATININE 1.71*  --  1.44* 1.28*    Estimated Creatinine Clearance: 49.9 mL/min (A) (by C-G formula based on SCr of 1.28 mg/dL (H)).   Medical History: Past Medical History:  Diagnosis Date  . Acute deep vein thrombosis (DVT) of right upper extremity ([name]) 08/30/2017  . Chronic diastolic CHF (congestive heart failure) ([place]) 08/30/2017  . Cocaine abuse, continuous ([place])   . Diabetes mellitus    Hb A1C = 12.6 on 05/15/11, managed on Novolog 70/30, 35 U qam, 25 U qpm  . Hypertension    poorly controlled  . Insomnia disorder   . Shortness of breath   . Stroke [facility])     Assessment: 46 YOF presenting 4/1 with bleeding from tracheostomy site.  She was also found to have subcutaneous hematoma of R flank. She is on warfarin for history of DVT. She is on warfarin 8mg  PO daily prior to admission. Her last dose of warfarin was 3/31. Warfarin has been held since admission but Hgb/Hct remains stable, so pharmacy asked to resume.  Today, 10/15/2017  INR SUBtherapeutic as expected after warfarin re-initiated on 4/6PM and now has gotten 4mg  and 8mg  warfarin as inpatient  CBC: Hgb 8.9 trending down, Plts stable  No known active bleeding  Goal of Therapy:  INR 2-3, prefer 2-2.5 with recent bleeding events  Plan:   Repeat Warfarin 8mg  PO x 1 tonight  Started with conservative dose 4/6 for prolonged period of time  w/o requiring warfarin dosing and INR remaining > 2.   Daily INR  Monitor for bleeding  Adrian Saran, PharmD, BCPS Pager [phone] 10/15/2017 9:11 AM

## 2017-10-15 NOTE — Progress Notes (Signed)
Nutrition Follow-up  DOCUMENTATION CODES:   Obesity unspecified  INTERVENTION:  - Continue current TF regimen. - Free water flush to continue to be per MD.  NUTRITION DIAGNOSIS:   Inadequate oral intake related to inability to eat as evidenced by NPO status. -ongoing  GOAL:   Patient will meet greater than or equal to 90% of their needs -met with TF regimen  MONITOR:   TF tolerance, Weight trends, Labs, Skin, I & O's  ASSESSMENT:   62 y.o. 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 She was brought in by EMS from nursing home with complaints of bleeding from the tracheostomy. No documented fever or chills, nausea, vomiting, diarrhea, or abdominal pain.  Patient has a trach and PEG. Patient has had multiple hospital admissions the last 6 months for diabetic ketoacidosis, acute encephalopathy and stroke, possible acute bacterial meningitis, status epilepticus, pneumonia and acute respiratory failure with hypoxemia.  According to the daughter, she used to speak but has not spoken for at least the past month.   Weight with some slight fluctuations throughout admission. Pt with trach and PEG. She is currently receiving Glucerna 1.2 @ 70 mL/hr x20 hours (d/t BID Dilantin per PEG) with 30 mL Prostat BID and 200 mL free water QID. This regimen is providing 1880 kcal, 114 grams of protein, and 1927 mL free water. CBGs are now very much improved and serum Na and Cl also improved/improving.   Per Dr. Elza Rafter note this AM: acute blood loss anemia s/p subcutaneous hematoma with no overt bleeding and FOBT negative and hematoma appears to be resolving, UTI resolved, plan to continue current IVF d/y mild hypernatremia, metabolic acidosis  possibly 2/2 hyperchloremia, diarrhea with negative C. Diff test. Plan to transfer to Med Surg and return to SNF when metabolic acidsos better and Hgb stable.     Medications reviewed; sliding scale Novolog, 2 units Novolog every 4 hours, 40 units Lantus BID, 5 mL Mycostatin QID, 40 mg IV Protonix BID, 250 mg Dilantin per PEG BID. Labs reviewed; CBGs: 80 and 98 mg/dL this AM, Na: 153 mmol/L, Cl: 127 mmol/L, BUN: 99 mg/dL, creatinine: 1.28 mg/dL, Phos: 5.3 mg/dL, GFR: 52 mL/min.  IVF: D5-50 mEq sodium bicarb @ 50 mL/hr (204 kcal).     Diet Order:  Diet NPO time specified Seizure precautions  EDUCATION NEEDS:   No education needs have been identified at this time  Skin:  Skin Assessment: Skin Integrity Issues: Skin Integrity Issues:: Other (Comment) Unstageable: coccyx Other: R buttocks and R pretibial wound  Last BM:  4/7  Height:   Ht Readings from Last 1 Encounters:  10/11/17 _0  (1.676 m)    Weight:   Wt Readings from Last 1 Encounters:  10/15/17 176 lb 9.4 oz (80.1 kg)    Ideal Body Weight:  56.82 kg  BMI:  Body mass index is 28.5 kg/m.  Estimated Nutritional Needs:   Kcal:  1890-2050 (23-25 kcal/kg)  Protein:  100-115 grams (1.2-1.4 grams/kg)  Fluid:  >/= 1.8 L/day      Jarome Matin, MS, RD, LDN, [facility] Inpatient Clinical Dietitian Pager # [phone] After hours/weekend pager # [phone]

## 2017-10-15 NOTE — Progress Notes (Signed)
PROGRESS NOTE [facility]   [place]   TDH:[id number] DOB: [birth year]  DOA: 10/08/2017 PCP: Nolene Ebbs, MD   Brief Narrative:  [name] is a 62 year old female with medical history significant of CKD stage III, hypertension, right upper extremity DVT on Coumadin, uncontrolled diabetes, hepatitis C and left pontine intracranial hemorrhage with residual right side hemiparesis and acute lacunar infarct April 2018, trach dependent.  Patient presented from nursing home with complaints of bleeding from tracheostomy site.  Patient had 200-250 cc of bleeding from tracheostomy at the nursing facility.  Upon ED evaluation bleeding has stopped.  CT scan of the abdomen and pelvis was done show probably subcutaneous hematoma in the low right flank and overlying the right iliac wing measuring approximately 2.7-3 cm.  Patient found to be slightly anemic and was admitted for further workup.  She was given FFP and transfused 2 PRBCs.  Also found to have UTI and she was started on broad-spectrum antibiotics, completed course for 7 days. Hospital course complicated with diarrhea, hypernatremia and metabolic acidosis.   Subjective: Patient seen and examined, neurologically remains unchanged, at baseline. Afebrile, continues to have diarrhea but improving.   Assessment & Plan: Acute blood loss anemia secondary to subcutaneous hematoma of the right flank/pelvic area No overt bleeding, FOBT negative. Hgb slight drop today.  Subcutaneous hematoma seems to be resolving.  Continue to monitor closely. Patient is status post 2 units of PRBCs and 1 unit of FFP. Warfarin has been resumed. No signs of over bleeding.    Hx of DVT RUE  Warfarin has been resumed, INR subtherapeutic.  Hgb decreasing, plt stable, will continue to monitor closely.   Sepsis secondary to E. coli UTI - Resolved  Initially treated with vancomycin and Zosyn, completed course Rocephin for 7 days  Urine culture shows E. coli  and Klebsiella, MDR but sensitive to ceftriaxone.  Hypernatremia - Sodium mild increase today  Insensible losses Continue D5 with bicarb given metabolic acidosis. Continue free water intake to 200 cc every 6 hours Check BMP in AM   Hypertension BP much improved  Continue labetalol and clonidine since patient lisinopril has continued to be on hold, increase amlodipine to 10 mg. Continue hydralazine as needed and monitor BP  AKI on CKD stage III/Metabolic acidosis with hyperchloremia ? RTA  Cr back to baseline, remains with metabolic acidosis and patient continues with diarrhea  Continue Bicarb/D5W. Check BMP in AM   Seizure disorders - no seizing events Continue Vimpat, Keppra, Topamax Seizure precautions  Status post trach/PEG Trach care per track team, suction as needed, continue feeding tubes per RD Aggressive pulmonary toilet. Turn patient frequently   Diarrhea  C diff negative  Continue Lomotil   Coccyx unstageable wound POA Wound care per RN protocol  DVT prophylaxis: SCDs Code Status: Full Code Family Communication: None at bedside  Disposition Plan:  Transfer to [facility]. Back to SNF when metabolic acidosis improved and Hgb stable   Consultants:   None   Procedures:   None  Antimicrobials: Anti-infectives (From admission, onward)   Start     Dose/Rate Route Frequency Ordered Stop   10/13/17 1400  cefTRIAXone (ROCEPHIN) 1 g in dextrose 5 % 50 mL injection     100 mL/hr over 30 Minutes Intravenous Every 24 hours 10/13/17 0613 10/14/17 1516   10/10/17 1400  cefTRIAXone (ROCEPHIN) 1 g in dextrose 5 % 50 mL injection  Status:  Discontinued     100 mL/hr over 30 Minutes Intravenous Every 24 hours 10/10/17  1041 10/13/17 0611   10/10/17 1045  cefTRIAXone (ROCEPHIN) 1 g in sodium chloride 0.9 % 100 mL IVPB  Status:  Discontinued     1 g 200 mL/hr over 30 Minutes Intravenous Every 24 hours 10/10/17 1039 10/10/17 1041   10/09/17 1400  cefTRIAXone (ROCEPHIN) 1 g in  sodium chloride 0.9 % 100 mL IVPB  Status:  Discontinued     1 g 200 mL/hr over 30 Minutes Intravenous Every 24 hours 10/09/17 0726 10/10/17 1039   10/08/17 1200  piperacillin-tazobactam (ZOSYN) IVPB 2.25 g  Status:  Discontinued     2.25 g 100 mL/hr over 30 Minutes Intravenous Every 6 hours 10/08/17 0646 10/09/17 0726   10/08/17 0700  vancomycin (VANCOCIN) 1,750 mg in sodium chloride 0.9 % 500 mL IVPB     1,750 mg 250 mL/hr over 120 Minutes Intravenous  Once 10/08/17 0645 10/08/17 1001   10/08/17 0630  piperacillin-tazobactam (ZOSYN) IVPB 3.375 g  Status:  Discontinued     3.375 g 100 mL/hr over 30 Minutes Intravenous  Once 10/08/17 0622 10/09/17 0731   10/08/17 0630  vancomycin (VANCOCIN) IVPB 1000 mg/200 mL premix  Status:  Discontinued     1,000 mg 200 mL/hr over 60 Minutes Intravenous  Once 10/08/17 0622 10/08/17 0645       Objective: Vitals:   10/15/17 0406 10/15/17 0500 10/15/17 0600 10/15/17 0800  BP: (!) 161/79  (!) 154/69   Pulse:   87   Resp:   18   Temp:    99.2 F (37.3 C)  TempSrc:    Axillary  SpO2:   96%   Weight:  80.1 kg (176 lb 9.4 oz)    Height:        Intake/Output Summary (Last 24 hours) at 10/15/2017 0920 Last data filed at 10/15/2017 0600 Gross per 24 hour  Intake 3167 ml  Output 2800 ml  Net 367 ml   Filed Weights   10/14/17 0408 10/14/17 0517 10/15/17 0500  Weight: 78.4 kg (172 lb 13.5 oz) 79.2 kg (174 lb 9.7 oz) 80.1 kg (176 lb 9.4 oz)    Examination:  General: Pt is alert, awake, not in acute distress Cardiovascular: RRR, S1/S2 +, no rubs, no gallops Respiratory: Good air entry improved from yesterday, bronchial sounds Abdominal: Soft, NT, ND, rectal tube in place large amount of green stool present  Extremities: no edema.   Data Reviewed: I have personally reviewed following labs and imaging studies  CBC: Recent Labs  Lab 10/11/17 0757 10/12/17 0109 10/13/17 0524 10/14/17 0356 10/15/17 0345  WBC 18.5* 15.3* 17.6* 16.8* 15.4*    NEUTROABS 14.1* 12.3*  --   --  11.3*  HGB 11.1* 10.1* 9.4* 9.6* 8.9*  HCT 36.1 34.0* 29.4* 30.7* 28.3*  MCV 96.5 97.1 92.7 93.0 94.0  PLT 312 261 232 214 419   Basic Metabolic Panel: Recent Labs  Lab 10/12/17 0654 10/12/17 1706 10/13/17 0524 10/14/17 0401 10/15/17 0345  NA 153* 147* 147* 150* 153*  K 3.4* 3.7 3.4* 4.3 4.2  CL 124* 121* 120* 126* 127*  CO2 15* 17* 16* 14* 16*  GLUCOSE 271* 244* 218* 145* 93  BUN 169* 173* 148* 123* 99*  CREATININE 2.18* 2.01* 1.71* 1.44* 1.28*  CALCIUM 8.9 8.6* 8.6* 9.0 9.2  MG  --   --   --  1.9 1.8  PHOS  --   --   --  5.6* 5.3*   GFR: Estimated Creatinine Clearance: 49.9 mL/min (A) (by C-G formula based on  SCr of 1.28 mg/dL (H)). Liver Function Tests: No results for input(s): AST, ALT, ALKPHOS, BILITOT, PROT, ALBUMIN in the last 168 hours. No results for input(s): LIPASE, AMYLASE in the last 168 hours. No results for input(s): AMMONIA in the last 168 hours. Coagulation Profile: Recent Labs  Lab 10/11/17 1504 10/12/17 0109 10/13/17 0524 10/14/17 0401 10/15/17 0345  INR 3.25 2.97 1.61 1.43 1.53   Cardiac Enzymes: No results for input(s): CKTOTAL, CKMB, CKMBINDEX, TROPONINI in the last 168 hours. BNP (last 3 results) No results for input(s): PROBNP in the last 8760 hours. HbA1C: No results for input(s): HGBA1C in the last 72 hours. CBG: Recent Labs  Lab 10/14/17 1514 10/14/17 1921 10/14/17 2344 10/15/17 0316 10/15/17 0723  GLUCAP 132* 159* 107* 80 98   Lipid Profile: No results for input(s): CHOL, HDL, LDLCALC, TRIG, CHOLHDL, LDLDIRECT in the last 72 hours. Thyroid Function Tests: No results for input(s): TSH, T4TOTAL, FREET4, T3FREE, THYROIDAB in the last 72 hours. Anemia Panel: No results for input(s): VITAMINB12, FOLATE, FERRITIN, TIBC, IRON, RETICCTPCT in the last 72 hours. Sepsis Labs: No results for input(s): PROCALCITON, LATICACIDVEN in the last 168 hours.  Recent Results (from the past 240 hour(s))  MRSA  PCR Screening     Status: None   Collection Time: 10/08/17 11:36 AM  Result Value Ref Range Status   MRSA by PCR NEGATIVE NEGATIVE Final    Comment:        The GeneXpert MRSA Assay (FDA approved for NASAL specimens only), is one component of a comprehensive MRSA colonization surveillance program. It is not intended to diagnose MRSA infection nor to guide or monitor treatment for MRSA infections. Performed at [facility], [place] [address], [place], [place] [address]   Urine culture     Status: Abnormal   Collection Time: 10/08/17 11:43 AM  Result Value Ref Range Status   Specimen Description   Final    URINE, RANDOM Performed at [facility] [address] [place], [place] [address]    Special Requests   Final    NONE Performed at [facility], [place] [address] [place],  [address]    Culture (A)  Final    >=100,000 COLONIES/mL ESCHERICHIA COLI >=100,000 COLONIES/mL KLEBSIELLA PNEUMONIAE    Report Status 10/11/2017 FINAL  Final   Organism ID, Bacteria ESCHERICHIA COLI (A)  Final   Organism ID, Bacteria KLEBSIELLA PNEUMONIAE (A)  Final      Susceptibility   Escherichia coli - MIC*    AMPICILLIN >=32 RESISTANT Resistant     CEFAZOLIN 16 SENSITIVE Sensitive     CEFTRIAXONE <=1 SENSITIVE Sensitive     CIPROFLOXACIN >=4 RESISTANT Resistant     GENTAMICIN <=1 SENSITIVE Sensitive     IMIPENEM <=0.25 SENSITIVE Sensitive     NITROFURANTOIN <=16 SENSITIVE Sensitive     TRIMETH/SULFA <=20 SENSITIVE Sensitive     AMPICILLIN/SULBACTAM >=32 RESISTANT Resistant     PIP/TAZO 8 SENSITIVE Sensitive     Extended ESBL NEGATIVE Sensitive     * >=100,000 COLONIES/mL ESCHERICHIA COLI   Klebsiella pneumoniae - MIC*    AMPICILLIN >=32 RESISTANT Resistant     CEFAZOLIN <=4 SENSITIVE Sensitive     CEFTRIAXONE <=1 SENSITIVE Sensitive     CIPROFLOXACIN <=0.25 SENSITIVE Sensitive     GENTAMICIN <=1 SENSITIVE Sensitive     IMIPENEM  <=0.25 SENSITIVE Sensitive     NITROFURANTOIN 128 RESISTANT Resistant     TRIMETH/SULFA <=20 SENSITIVE Sensitive  AMPICILLIN/SULBACTAM 16 INTERMEDIATE Intermediate     PIP/TAZO 8 SENSITIVE Sensitive     Extended ESBL NEGATIVE Sensitive     * >=100,000 COLONIES/mL KLEBSIELLA PNEUMONIAE  C difficile quick scan w PCR reflex     Status: None   Collection Time: 10/14/17  8:44 AM  Result Value Ref Range Status   C Diff antigen NEGATIVE NEGATIVE Final   C Diff toxin NEGATIVE NEGATIVE Final   C Diff interpretation No C. difficile detected.  Final    Comment: Performed at [facility], [place] [address] [place], [place] [address]     Radiology Studies: Dg Chest Port 1 View  Result Date: 10/14/2017 CLINICAL DATA:  62 year old female with shortness of breath EXAM: PORTABLE CHEST 1 VIEW COMPARISON:  Prior chest x-ray 10/12/2017 FINDINGS: Tracheostomy tube remains in stable position with the tip midline and at the level of the clavicles. Stable mild cardiomegaly. Slightly lower inspiratory volumes with increasing streaky perihilar and bibasilar airspace opacities likely reflecting atelectasis. No overt edema, focal consolidation, pleural effusion or pneumothorax. No acute osseous abnormality. 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 IMPRESSION: 1. Lower inspiratory volumes with increasing perihilar and bibasilar atelectasis. 2. Stable cardiomegaly. Electronically Signed   By: Jacqulynn Cadet M.D.   On: 10/14/2017 10:37    Scheduled Meds: . amLODipine  10 mg Per Tube Daily  . chlorhexidine  15 mL Mouth Rinse BID  . cloNIDine  0.2 mg Per Tube TID  . collagenase   Topical Daily  . feeding supplement (PRO-STAT SUGAR FREE 64)  30 mL Per Tube BID  . free water  200 mL Per Tube Q6H  . insulin aspart  0-15 Units Subcutaneous Q4H  . insulin aspart  2 Units Subcutaneous Q4H  . insulin glargine  40 Units Subcutaneous BID  . labetalol  400 mg Per Tube TID  .  lacosamide  200 mg Per Tube BID  . levETIRAcetam  1,000 mg Per Tube BID  . mouth rinse  15 mL Mouth Rinse q12n4p  . nystatin  5 mL Oral QID  . pantoprazole (PROTONIX) IV  40 mg Intravenous Q12H  . phenytoin  250 mg Per Tube BID  . topiramate  200 mg Per Tube BID  . warfarin  8 mg Per Tube ONCE-1800  . Warfarin - Pharmacist Dosing Inpatient   Does not apply q1800   Continuous Infusions: . feeding supplement (GLUCERNA 1.2 CAL) 70 mL/hr at 10/15/17 0000  .  sodium bicarbonate  infusion 1000 mL       LOS: 7 days    Time spent: Total of 35 minutes spent with pt, greater than 50% of which was spent in discussion of  treatment, counseling and coordination of care   Chipper Oman, MD Pager: Text Page via [email or web address]   If 7PM-7AM, please contact night-coverage [email or web address] 10/15/2017, 9:20 AM   Note - This record has been created using [employer]. Chart creation errors have been sought, but may not always have been located. Such creation errors do not reflect on the standard of medical care.

## 2017-10-15 NOTE — Progress Notes (Signed)
Patient was suctioned x3. Pt arrived at 1500. Saturation sustained 96-100% Trach collar 5l,28%. Will continue to monitor.

## 2017-10-16 DIAGNOSIS — E872 Acidosis, unspecified: Secondary | ICD-10-CM

## 2017-10-16 DIAGNOSIS — N179 Acute kidney failure, unspecified: Secondary | ICD-10-CM

## 2017-10-16 LAB — CBC
HCT: 29.6 % — ABNORMAL LOW (ref 36.0–46.0)
HEMOGLOBIN: 9.2 g/dL — AB (ref 12.0–15.0)
MCH: 29.4 pg (ref 26.0–34.0)
MCHC: 31.1 g/dL (ref 30.0–36.0)
MCV: 94.6 fL (ref 78.0–100.0)
PLATELETS: 212 10*3/uL (ref 150–400)
RBC: 3.13 MIL/uL — AB (ref 3.87–5.11)
RDW: 18.4 % — ABNORMAL HIGH (ref 11.5–15.5)
WBC: 14.6 10*3/uL — ABNORMAL HIGH (ref 4.0–10.5)

## 2017-10-16 LAB — GLUCOSE, CAPILLARY
GLUCOSE-CAPILLARY: 165 mg/dL — AB (ref 65–99)
Glucose-Capillary: 196 mg/dL — ABNORMAL HIGH (ref 65–99)
Glucose-Capillary: 229 mg/dL — ABNORMAL HIGH (ref 65–99)
Glucose-Capillary: 175 mg/dL — ABNORMAL HIGH (ref 65–99)
Glucose-Capillary: 202 mg/dL — ABNORMAL HIGH (ref 65–99)

## 2017-10-16 LAB — PHOSPHORUS: PHOSPHORUS: 5.1 mg/dL — AB (ref 2.5–4.6)

## 2017-10-16 LAB — BASIC METABOLIC PANEL
ANION GAP: 11 (ref 5–15)
BUN: 86 mg/dL — ABNORMAL HIGH (ref 6–20)
CALCIUM: 9.3 mg/dL (ref 8.9–10.3)
CO2: 18 mmol/L — ABNORMAL LOW (ref 22–32)
Chloride: 121 mmol/L — ABNORMAL HIGH (ref 101–111)
Creatinine, Ser: 1.14 mg/dL — ABNORMAL HIGH (ref 0.44–1.00)
GFR, EST AFRICAN AMERICAN: 59 mL/min — AB (ref >60)
GFR, EST NON AFRICAN AMERICAN: 51 mL/min — AB (ref >60)
Glucose, Bld: 175 mg/dL — ABNORMAL HIGH (ref 65–99)
Potassium: 4.2 mmol/L (ref 3.5–5.1)
SODIUM: 150 mmol/L — AB (ref 135–145)

## 2017-10-16 LAB — PROTIME-INR
INR: 1.51
Prothrombin Time: 18.1 seconds — ABNORMAL HIGH (ref 11.4–15.2)

## 2017-10-16 LAB — MAGNESIUM: Magnesium: 1.8 mg/dL (ref 1.7–2.4)

## 2017-10-16 MED ORDER — WARFARIN SODIUM 4 MG PO TABS
8.0000 mg | ORAL_TABLET | Freq: Once | ORAL | Status: AC
  Administered 2017-10-16: 8 mg
  Filled 2017-10-16: qty 2

## 2017-10-16 NOTE — Progress Notes (Addendum)
ANTICOAGULATION CONSULT NOTE - follow up  Pharmacy Consult for warfarin Indication: h/o DVT  No Known Allergies  Patient Measurements: Height: 5\' 6"  (167.6 cm) Weight: 189 lb (85.7 kg) IBW/kg (Calculated) : 59.3  Vital Signs: Temp: 98.3 F (36.8 C) (04/09 0409) Temp Source: Axillary (04/09 0409) BP: 146/77 (04/09 0409) Pulse Rate: 87 (04/09 0409)  Labs: Recent Labs    10/14/17 0356 10/14/17 0401 10/15/17 0345 10/16/17 0442  HGB 9.6*  --  8.9* 9.2*  HCT 30.7*  --  28.3* 29.6*  PLT 214  --  192 212  LABPROT  --  17.3* 18.3* 18.1*  INR  --  1.43 1.53 1.51  CREATININE  --  1.44* 1.28* 1.14*    Estimated Creatinine Clearance: 57.9 mL/min (A) (by C-G formula based on SCr of 1.14 mg/dL (H)).  Assessment: 42 YOF presenting 4/1 with bleeding from tracheostomy site.  She was also found to have subcutaneous hematoma of R flank. She is on warfarin for history of DVT. She is on warfarin 8mg  PO daily prior to admission. Her last dose of warfarin was 3/31. Warfarin has been held since admission but Hgb/Hct remains stable, so pharmacy asked to resume 4/6.   Today, 10/16/2017  INR SUBtherapeutic at 1.51  dose not charted as given 4/8 - spoke w/ RN, it was given, she will chart now   CBC: Hgb 9.2 stable, Plts stable  No known active bleeding  Goal of Therapy:  INR 2-3, prefer 2-2.5 with recent bleeding events  Plan:   Repeat Warfarin 8mg  PO x 1 tonight  Started with conservative dose 4/6 for prolonged period of time w/o requiring warfarin dosing and INR remaining > 2.   Daily INR  Monitor for bleeding  Eudelia Bunch, Pharm.D. [phone] 10/16/2017 8:48 AM

## 2017-10-16 NOTE — Plan of Care (Signed)

## 2017-10-16 NOTE — Progress Notes (Signed)
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  Patient presented from nursing home with complaints of bleeding from tracheostomy site.  Patient had 200-250 cc of bleeding from tracheostomy at the nursing facility.  Upon ED evaluation bleeding has stopped.  CT scan of the abdomen and pelvis was done show probably subcutaneous hematoma in the low right flank and overlying the right iliac wing measuring approximately 2.7-3 cm.  Patient found to be slightly anemic and was admitted for further workup.  She was given FFP and transfused 2 PRBCs.  Also found to have UTI and she was started on broad-spectrum antibiotics, completed course for 7 days. Hospital course complicated with diarrhea, hypernatremia and metabolic acidosis.   Subjective: No significant changes, stool output decreasing   Assessment & Plan: Acute blood loss anemia secondary to subcutaneous hematoma of the right flank/pelvic area No overt bleeding, FOBT negative. Subcutaneous hematoma seems to be resolving.  Continue to monitor closely. Patient is status post 2 units of PRBCs and 1 unit of FFP. Warfarin has been resumed. No signs of over bleeding.  Hemoglobin remains stable  Hx of DVT RUE  Warfarin has been resumed per pharmacy, INR remains subtherapeutic Hemoglobin improved.  Continue to monitor.  Sepsis secondary to E. coli UTI - Resolved  Initially treated with vancomycin and Zosyn, completed course Rocephin for 7 days  Urine culture shows E. coli and Klebsiella, MDR but sensitive to ceftriaxone.  Hypernatremia - sodium  continues to improve Insensible losses, diarrhea has diminished We will hold IV fluids and continue free water intake 200 cc every 6 hours Check BMP in AM   Hypertension BP stable Continue labetalol and clonidine since patient lisinopril has continued to be on hold, increase amlodipine to 10 mg. Continue hydralazine as needed and monitor BP  AKI on CKD stage III/Metabolic acidosis with hyperchloremia ? RTA  Creatinine back to baseline, metabolic acidosis has improved with bicarbonate 18.  Will discontinue IV fluids on monitor, diarrhea has improved  so I expect metabolic acidosis  to continue to improve. Check BMP in a.m.  Seizure disorders - no seizing events Continue Vimpat, Keppra, Topamax Seizure precautions  Status post trach/PEG Trach care per track team, suction as needed, continue feeding tubes per RD Aggressive pulmonary toilet. Turn patient frequently   Diarrhea -improving C diff negative  Continue Lomotil   Coccyx unstageable wound POA Wound care per RN protocol  DVT prophylaxis: SCDs Code Status: Full Code Family Communication: None at bedside  Disposition Plan: SNF in 1-2 days wnen INR close to therapeutic levels and metabolic derangement improved and hemoglobin stable  Consultants:   None   Procedures:   None  Antimicrobials: Anti-infectives (From admission, onward)   Start     Dose/Rate Route Frequency Ordered Stop   10/13/17 1400  cefTRIAXone (ROCEPHIN) 1 g in dextrose 5 % 50 mL injection     100 mL/hr over 30 Minutes Intravenous Every 24 hours 10/13/17 0613 10/14/17 1516   10/10/17 1400  cefTRIAXone (ROCEPHIN) 1 g in dextrose 5 % 50 mL injection  Status:  Discontinued     100 mL/hr over 30 Minutes Intravenous Every 24 hours 10/10/17  1041 10/13/17 0611   10/10/17 1045  cefTRIAXone (ROCEPHIN) 1 g in sodium chloride 0.9 % 100 mL IVPB  Status:  Discontinued     1 g 200 mL/hr over 30 Minutes Intravenous Every 24 hours 10/10/17 1039 10/10/17 1041    10/09/17 1400  cefTRIAXone (ROCEPHIN) 1 g in sodium chloride 0.9 % 100 mL IVPB  Status:  Discontinued     1 g 200 mL/hr over 30 Minutes Intravenous Every 24 hours 10/09/17 0726 10/10/17 1039   10/08/17 1200  piperacillin-tazobactam (ZOSYN) IVPB 2.25 g  Status:  Discontinued     2.25 g 100 mL/hr over 30 Minutes Intravenous Every 6 hours 10/08/17 0646 10/09/17 0726   10/08/17 0700  vancomycin (VANCOCIN) 1,750 mg in sodium chloride 0.9 % 500 mL IVPB     1,750 mg 250 mL/hr over 120 Minutes Intravenous  Once 10/08/17 0645 10/08/17 1001   10/08/17 0630  piperacillin-tazobactam (ZOSYN) IVPB 3.375 g  Status:  Discontinued     3.375 g 100 mL/hr over 30 Minutes Intravenous  Once 10/08/17 0622 10/09/17 0731   10/08/17 0630  vancomycin (VANCOCIN) IVPB 1000 mg/200 mL premix  Status:  Discontinued     1,000 mg 200 mL/hr over 60 Minutes Intravenous  Once 10/08/17 0622 10/08/17 0645       Objective: Vitals:   10/16/17 0903 10/16/17 1118 10/16/17 1522 10/16/17 1604  BP:   (!) 159/84   Pulse: 91 88 88 89  Resp: 18 20 20 18   Temp:   98.7 F (37.1 C)   TempSrc:   Oral   SpO2: 100% 99% 100% 99%  Weight:      Height:        Intake/Output Summary (Last 24 hours) at 10/16/2017 1948 Last data filed at 10/16/2017 1500 Gross per 24 hour  Intake 1478.33 ml  Output 3300 ml  Net -1821.67 ml   Filed Weights   10/14/17 0517 10/15/17 0500 10/16/17 0409  Weight: 79.2 kg (174 lb 9.7 oz) 80.1 kg (176 lb 9.4 oz) 85.7 kg (189 lb)    Examination:  General: NAD, nonverbal Cardiovascular: RRR, S1/S2  Respiratory: Good air entry diffuse rhonchi Abdominal: Soft, NT, ND, bowel sounds +, Rectal tube in place, + PEG  Extremities: no edema  Data Reviewed: I have personally reviewed following labs and imaging studies  CBC: Recent Labs  Lab 10/11/17 0757 10/12/17 0109 10/13/17 0524 10/14/17 0356 10/15/17 0345 10/16/17 0442  WBC 18.5* 15.3* 17.6* 16.8* 15.4* 14.6*  NEUTROABS 14.1* 12.3*  --   --  11.3*   --   HGB 11.1* 10.1* 9.4* 9.6* 8.9* 9.2*  HCT 36.1 34.0* 29.4* 30.7* 28.3* 29.6*  MCV 96.5 97.1 92.7 93.0 94.0 94.6  PLT 312 261 232 214 192 741   Basic Metabolic Panel: Recent Labs  Lab 10/12/17 1706 10/13/17 0524 10/14/17 0401 10/15/17 0345 10/16/17 0442  NA 147* 147* 150* 153* 150*  K 3.7 3.4* 4.3 4.2 4.2  CL 121* 120* 126* 127* 121*  CO2 17* 16* 14* 16* 18*  GLUCOSE 244* 218* 145* 93 175*  BUN 173* 148* 123* 99* 86*  CREATININE 2.01* 1.71* 1.44* 1.28* 1.14*  CALCIUM 8.6* 8.6* 9.0 9.2 9.3  MG  --   --  1.9 1.8 1.8  PHOS  --   --  5.6* 5.3* 5.1*   GFR: Estimated Creatinine Clearance: 57.9 mL/min (A) (by C-G formula based on SCr of 1.14 mg/dL (H)). Liver Function Tests: No results for input(s): AST, ALT, ALKPHOS, BILITOT, PROT, ALBUMIN  in the last 168 hours. No results for input(s): LIPASE, AMYLASE in the last 168 hours. No results for input(s): AMMONIA in the last 168 hours. Coagulation Profile: Recent Labs  Lab 10/12/17 0109 10/13/17 0524 10/14/17 0401 10/15/17 0345 10/16/17 0442  INR 2.97 1.61 1.43 1.53 1.51   Cardiac Enzymes: No results for input(s): CKTOTAL, CKMB, CKMBINDEX, TROPONINI in the last 168 hours. BNP (last 3 results) No results for input(s): PROBNP in the last 8760 hours. HbA1C: No results for input(s): HGBA1C in the last 72 hours. CBG: Recent Labs  Lab 10/15/17 2203 10/16/17 0109 10/16/17 0405 10/16/17 1149 10/16/17 1650  GLUCAP 172* 196* 165* 229* 175*   Lipid Profile: No results for input(s): CHOL, HDL, LDLCALC, TRIG, CHOLHDL, LDLDIRECT in the last 72 hours. Thyroid Function Tests: No results for input(s): TSH, T4TOTAL, FREET4, T3FREE, THYROIDAB in the last 72 hours. Anemia Panel: No results for input(s): VITAMINB12, FOLATE, FERRITIN, TIBC, IRON, RETICCTPCT in the last 72 hours. Sepsis Labs: No results for input(s): PROCALCITON, LATICACIDVEN in the last 168 hours.  Recent Results (from the past 240 hour(s))  MRSA PCR Screening      Status: None   Collection Time: 10/08/17 11:36 AM  Result Value Ref Range Status   MRSA by PCR NEGATIVE NEGATIVE Final    Comment:        The GeneXpert MRSA Assay (FDA approved for NASAL specimens only), is one component of a comprehensive MRSA colonization surveillance program. It is not intended to diagnose MRSA infection nor to guide or monitor treatment for MRSA infections. Performed at [facility], [place] [address], [place], [place] [address]   Urine culture     Status: Abnormal   Collection Time: 10/08/17 11:43 AM  Result Value Ref Range Status   Specimen Description   Final    URINE, RANDOM Performed at [facility] [address], [place], [place] [address]    Special Requests   Final    NONE Performed at [facility], [place] [address], [place],  [address]    Culture (A)  Final    >=100,000 COLONIES/mL ESCHERICHIA COLI >=100,000 COLONIES/mL KLEBSIELLA PNEUMONIAE    Report Status 10/11/2017 FINAL  Final   Organism ID, Bacteria ESCHERICHIA COLI (A)  Final   Organism ID, Bacteria KLEBSIELLA PNEUMONIAE (A)  Final      Susceptibility   Escherichia coli - MIC*    AMPICILLIN >=32 RESISTANT Resistant     CEFAZOLIN 16 SENSITIVE Sensitive     CEFTRIAXONE <=1 SENSITIVE Sensitive     CIPROFLOXACIN >=4 RESISTANT Resistant     GENTAMICIN <=1 SENSITIVE Sensitive     IMIPENEM <=0.25 SENSITIVE Sensitive     NITROFURANTOIN <=16 SENSITIVE Sensitive     TRIMETH/SULFA <=20 SENSITIVE Sensitive     AMPICILLIN/SULBACTAM >=32 RESISTANT Resistant     PIP/TAZO 8 SENSITIVE Sensitive     Extended ESBL NEGATIVE Sensitive     * >=100,000 COLONIES/mL ESCHERICHIA COLI   Klebsiella pneumoniae - MIC*    AMPICILLIN >=32 RESISTANT Resistant     CEFAZOLIN <=4 SENSITIVE Sensitive     CEFTRIAXONE <=1 SENSITIVE Sensitive     CIPROFLOXACIN <=0.25 SENSITIVE Sensitive     GENTAMICIN <=1 SENSITIVE Sensitive     IMIPENEM <=0.25 SENSITIVE  Sensitive     NITROFURANTOIN 128 RESISTANT Resistant     TRIMETH/SULFA <=20 SENSITIVE Sensitive     AMPICILLIN/SULBACTAM 16 INTERMEDIATE Intermediate     PIP/TAZO 8 SENSITIVE Sensitive     Extended ESBL  NEGATIVE Sensitive     * >=100,000 COLONIES/mL KLEBSIELLA PNEUMONIAE  C difficile quick scan w PCR reflex     Status: None   Collection Time: 10/14/17  8:44 AM  Result Value Ref Range Status   C Diff antigen NEGATIVE NEGATIVE Final   C Diff toxin NEGATIVE NEGATIVE Final   C Diff interpretation No C. difficile detected.  Final    Comment: Performed at [facility], [place] [address], [place], [place] [address]     Radiology Studies: No results found.  Scheduled Meds: . amLODipine  10 mg Per Tube Daily  . chlorhexidine  15 mL Mouth Rinse BID  . cloNIDine  0.2 mg Per Tube TID  . collagenase   Topical Daily  . feeding supplement (PRO-STAT SUGAR FREE 64)  30 mL Per Tube BID  . free water  200 mL Per Tube Q6H  . insulin aspart  0-15 Units Subcutaneous Q4H  . insulin aspart  2 Units Subcutaneous Q4H  . insulin glargine  40 Units Subcutaneous BID  . labetalol  400 mg Per Tube TID  . lacosamide  200 mg Per Tube BID  . levETIRAcetam  1,000 mg Per Tube BID  . mouth rinse  15 mL Mouth Rinse q12n4p  . nystatin  5 mL Oral QID  . pantoprazole (PROTONIX) IV  40 mg Intravenous Q12H  . phenytoin  250 mg Per Tube BID  . topiramate  200 mg Per Tube BID  . Warfarin - Pharmacist Dosing Inpatient   Does not apply q1800   Continuous Infusions: . feeding supplement (GLUCERNA 1.2 CAL) 1,000 mL (10/16/17 1852)     LOS: 8 days    Time spent: Total of 25 minutes spent with pt, greater than 50% of which was spent in discussion of  treatment, counseling and coordination of care   Chipper Oman, MD Pager: Text Page via [email or web address]   If 7PM-7AM, please contact night-coverage [email or web address] 10/16/2017, 7:48 PM   Note - This record has been created using [employer]. Chart  creation errors have been sought, but may not always have been located. Such creation errors do not reflect on the standard of medical care.

## 2017-10-17 DIAGNOSIS — A4151 Sepsis due to Escherichia coli [E. coli]: Principal | ICD-10-CM

## 2017-10-17 DIAGNOSIS — N179 Acute kidney failure, unspecified: Secondary | ICD-10-CM

## 2017-10-17 DIAGNOSIS — E872 Acidosis: Secondary | ICD-10-CM

## 2017-10-17 LAB — CBC WITH DIFFERENTIAL/PLATELET
Basophils Absolute: 0 10*3/uL (ref 0.0–0.1)
Basophils Relative: 0 %
EOS PCT: 4 %
Eosinophils Absolute: 0.5 10*3/uL (ref 0.0–0.7)
HEMATOCRIT: 31.3 % — AB (ref 36.0–46.0)
Hemoglobin: 9.7 g/dL — ABNORMAL LOW (ref 12.0–15.0)
LYMPHS ABS: 2.4 10*3/uL (ref 0.7–4.0)
LYMPHS PCT: 16 %
MCH: 28.9 pg (ref 26.0–34.0)
MCHC: 31 g/dL (ref 30.0–36.0)
MCV: 93.2 fL (ref 78.0–100.0)
MONO ABS: 0.6 10*3/uL (ref 0.1–1.0)
Monocytes Relative: 4 %
NEUTROS ABS: 11.5 10*3/uL — AB (ref 1.7–7.7)
Neutrophils Relative %: 76 %
PLATELETS: 196 10*3/uL (ref 150–400)
RBC: 3.36 MIL/uL — AB (ref 3.87–5.11)
RDW: 17.7 % — ABNORMAL HIGH (ref 11.5–15.5)
WBC: 15 10*3/uL — ABNORMAL HIGH (ref 4.0–10.5)

## 2017-10-17 LAB — GLUCOSE, CAPILLARY
GLUCOSE-CAPILLARY: 110 mg/dL — AB (ref 65–99)
GLUCOSE-CAPILLARY: 131 mg/dL — AB (ref 65–99)
GLUCOSE-CAPILLARY: 146 mg/dL — AB (ref 65–99)
GLUCOSE-CAPILLARY: 160 mg/dL — AB (ref 65–99)
Glucose-Capillary: 114 mg/dL — ABNORMAL HIGH (ref 65–99)
Glucose-Capillary: 167 mg/dL — ABNORMAL HIGH (ref 65–99)
Glucose-Capillary: 200 mg/dL — ABNORMAL HIGH (ref 65–99)

## 2017-10-17 LAB — PROTIME-INR
INR: 1.54
Prothrombin Time: 18.4 seconds — ABNORMAL HIGH (ref 11.4–15.2)

## 2017-10-17 LAB — BASIC METABOLIC PANEL
Anion gap: 10 (ref 5–15)
BUN: 76 mg/dL — AB (ref 6–20)
CHLORIDE: 120 mmol/L — AB (ref 101–111)
CO2: 19 mmol/L — AB (ref 22–32)
Calcium: 9.5 mg/dL (ref 8.9–10.3)
Creatinine, Ser: 1.08 mg/dL — ABNORMAL HIGH (ref 0.44–1.00)
GFR calc Af Amer: >60 mL/min (ref >60)
GFR calc non Af Amer: 55 mL/min — ABNORMAL LOW (ref >60)
Glucose, Bld: 124 mg/dL — ABNORMAL HIGH (ref 65–99)
Potassium: 4.5 mmol/L (ref 3.5–5.1)
Sodium: 149 mmol/L — ABNORMAL HIGH (ref 135–145)

## 2017-10-17 LAB — MAGNESIUM: Magnesium: 1.8 mg/dL (ref 1.7–2.4)

## 2017-10-17 MED ORDER — FREE WATER
350.0000 mL | Freq: Four times a day (QID) | Status: DC
  Administered 2017-10-17 – 2017-10-18 (×4): 350 mL

## 2017-10-17 MED ORDER — WARFARIN SODIUM 4 MG PO TABS
8.0000 mg | ORAL_TABLET | Freq: Once | ORAL | Status: AC
  Administered 2017-10-17: 8 mg
  Filled 2017-10-17: qty 2

## 2017-10-17 MED ORDER — DEXTROSE 5 % IV SOLN
INTRAVENOUS | Status: AC
  Administered 2017-10-17: 15:00:00 via INTRAVENOUS

## 2017-10-17 NOTE — Progress Notes (Signed)
ANTICOAGULATION CONSULT NOTE - follow up  Pharmacy Consult for warfarin Indication: h/o DVT  No Known Allergies  Patient Measurements: Height: 5\' 6"  (167.6 cm) Weight: 181 lb (82.1 kg) IBW/kg (Calculated) : 59.3  Vital Signs: Temp: 99.6 F (37.6 C) (04/10 0606) Temp Source: Oral (04/10 0606) BP: 124/70 (04/10 0606) Pulse Rate: 93 (04/10 0803)  Labs: Recent Labs    10/15/17 0345 10/16/17 0442 10/17/17 0445  HGB 8.9* 9.2* 9.7*  HCT 28.3* 29.6* 31.3*  PLT 192 212 196  LABPROT 18.3* 18.1* 18.4*  INR 1.53 1.51 1.54  CREATININE 1.28* 1.14* 1.08*    Estimated Creatinine Clearance: 59.8 mL/min (A) (by C-G formula based on SCr of 1.08 mg/dL (H)).  Assessment: 4 YOF presenting 4/1 with bleeding from tracheostomy site.  She was also found to have subcutaneous hematoma of R flank. She is on warfarin for history of DVT. She is on warfarin 8mg  PO daily prior to admission. Her last dose of warfarin was 3/31. Warfarin has been held since admission but Hgb/Hct remains stable, so pharmacy asked to resume 4/6.   Today, 10/17/2017  INR remains SUBtherapeutic at 1.54 after 4 mg x 1 and 3 doses of 8 mg, pt is a slow responder as evidenced by prolonged period of time w/o requiring warfarin dose and INR > 20  CBC: Hgb 9.7 stable, Plts stable  No known active bleeding  Goal of Therapy:  INR 2-3, prefer 2-2.5 with recent bleeding events  Plan:   Repeat Warfarin 8mg  PO x 1 tonight  Daily INR  Monitor for bleeding  Eudelia Bunch, Pharm.D. [phone] 10/17/2017 10:05 AM

## 2017-10-17 NOTE — Progress Notes (Signed)
CSW following to assist with disposition- pt from [facility], is a long term care resident and plans to return at [place]. Updated facility today who is aware of pt's status. Will coordinate return at DC.  Sharren Bridge, MSW, LCSW Clinical Social Work 10/17/2017 [phone]

## 2017-10-17 NOTE — Progress Notes (Signed)
PROGRESS NOTE        PATIENT DETAILS Name: [name] Age: 62 y.o. Sex: female Date of Birth: [birth year] Admit Date: 10/08/2017 Admitting Physician Norval Morton, MD HUT:[id number], Christean Grief, MD  Brief Narrative: Patient is a 62 y.o. female summary with history of PEG tube/tracheostomy/indwelling Foley catheter-bedbound-nonverbal-prior history of intracranial hemorrhage, acute lacunar infarction, DM-2, right upper extremity DVT on anticoagulation-admitted initially for bleeding around the tracheostomy site, upon further evaluation in the emergency room-CT scan of the abdomen showed a subcutaneous hematoma in the lower right flank/right iliac wing-patient was then admitted to the hospitalist service.  Hospital course has been complicated by diarrhea, hyponatremia and metabolic acidosis.  See below for further details  Subjective: Nonverbal-does not follow commands.  No major events overnight.  Assessment/Plan: Acute blood loss anemia secondary to subcutaneous hematoma in the right lower abdominal area: Hemoglobin levels have stabilized-patient is status post 2 units of PRBC.  No signs of bleeding at this time-warfarin has been resumed  Hypernatremia: Improving-increase free water via PEG tube-start gentle hydration with D5W-reevaluate tomorrow.  Sepsis secondary to E. coli UTI: This is pathophysiology has resolved-has mild persistent leukocytosis-has completed a course of antimicrobial therapy even culture positive for E. coli and Klebsiella pneumoniae.  Acute kidney injury on chronic kidney disease stage III: Acute kidney injury likely hemodynamically mediated-resolved with supportive care.  Metabolic acidosis: Secondary to diarrhea-resolving.  Recheck electrolytes periodically  Diarrhea: Supportive care-seems to have resolved.  C. difficile PCR negative.  Hypertension: Controlled-continue labetalol, clonidine and amlodipine.  Insulin-dependent DM-2: CBG  stable with Lantus 40 units twice daily, NovoLog 2 units every 4 hours and SSI.  Seizure disorder: Continue Vimpat, Keppra and Topamax.  No seizures during this hospital stay  Right upper extremity DVT: Coumadin-INR still subtherapeutic but slowly improving.  Unstageable sacral wound: Present prior to admission-continue wound care per wound care team.  Nonverbal/bedbound/PEG tube/tracheostomy/chronic indwelling Foley catheter-prior history of CVA/thalamic hemorrhage in 2018, recent history of status epilepticus and worsening encephalopathy- very poor prognosis overall.  DVT Prophylaxis: Full dose anticoagulation with Coumadin  Code Status: Full code   Family Communication: None at bedside  Disposition Plan: Remain inpatient-SNF on discharge-hopefully tomorrow  Antimicrobial agents: Anti-infectives (From admission, onward)   Start     Dose/Rate Route Frequency Ordered Stop   10/13/17 1400  cefTRIAXone (ROCEPHIN) 1 g in dextrose 5 % 50 mL injection     100 mL/hr over 30 Minutes Intravenous Every 24 hours 10/13/17 0613 10/14/17 1516   10/10/17 1400  cefTRIAXone (ROCEPHIN) 1 g in dextrose 5 % 50 mL injection  Status:  Discontinued     100 mL/hr over 30 Minutes Intravenous Every 24 hours 10/10/17 1041 10/13/17 0611   10/10/17 1045  cefTRIAXone (ROCEPHIN) 1 g in sodium chloride 0.9 % 100 mL IVPB  Status:  Discontinued     1 g 200 mL/hr over 30 Minutes Intravenous Every 24 hours 10/10/17 1039 10/10/17 1041   10/09/17 1400  cefTRIAXone (ROCEPHIN) 1 g in sodium chloride 0.9 % 100 mL IVPB  Status:  Discontinued     1 g 200 mL/hr over 30 Minutes Intravenous Every 24 hours 10/09/17 0726 10/10/17 1039   10/08/17 1200  piperacillin-tazobactam (ZOSYN) IVPB 2.25 g  Status:  Discontinued     2.25 g 100 mL/hr over 30 Minutes Intravenous Every 6 hours 10/08/17 0646 10/09/17 0726   10/08/17  0700  vancomycin (VANCOCIN) 1,750 mg in sodium chloride 0.9 % 500 mL IVPB     1,750 mg 250 mL/hr over 120  Minutes Intravenous  Once 10/08/17 0645 10/08/17 1001   10/08/17 0630  piperacillin-tazobactam (ZOSYN) IVPB 3.375 g  Status:  Discontinued     3.375 g 100 mL/hr over 30 Minutes Intravenous  Once 10/08/17 0622 10/09/17 0731   10/08/17 0630  vancomycin (VANCOCIN) IVPB 1000 mg/200 mL premix  Status:  Discontinued     1,000 mg 200 mL/hr over 60 Minutes Intravenous  Once 10/08/17 0622 10/08/17 0645      Procedures: None  CONSULTS:  None  Time spent: 25- minutes-Greater than 50% of this time was spent in counseling, explanation of diagnosis, planning of further management, and coordination of care.  MEDICATIONS: Scheduled Meds: . amLODipine  10 mg Per Tube Daily  . chlorhexidine  15 mL Mouth Rinse BID  . cloNIDine  0.2 mg Per Tube TID  . collagenase   Topical Daily  . feeding supplement (PRO-STAT SUGAR FREE 64)  30 mL Per Tube BID  . free water  350 mL Per Tube Q6H  . insulin aspart  0-15 Units Subcutaneous Q4H  . insulin aspart  2 Units Subcutaneous Q4H  . insulin glargine  40 Units Subcutaneous BID  . labetalol  400 mg Per Tube TID  . lacosamide  200 mg Per Tube BID  . levETIRAcetam  1,000 mg Per Tube BID  . mouth rinse  15 mL Mouth Rinse q12n4p  . nystatin  5 mL Oral QID  . pantoprazole (PROTONIX) IV  40 mg Intravenous Q12H  . phenytoin  250 mg Per Tube BID  . topiramate  200 mg Per Tube BID  . warfarin  8 mg Per Tube ONCE-1800  . Warfarin - Pharmacist Dosing Inpatient   Does not apply q1800   Continuous Infusions: . feeding supplement (GLUCERNA 1.2 CAL) 1,000 mL (10/17/17 0931)   PRN Meds:.albuterol, diphenoxylate-atropine, hydrALAZINE   PHYSICAL EXAM: Vital signs: Vitals:   10/16/17 2105 10/17/17 0606 10/17/17 0803 10/17/17 1122  BP: (!) 173/87 124/70    Pulse: 87 93 93 89  Resp: 20 20 20 18   Temp: 98.8 F (37.1 C) 99.6 F (37.6 C)    TempSrc: Oral Oral    SpO2: 97% 100% 99% 98%  Weight:  82.1 kg (181 lb)    Height:       Filed Weights   10/15/17 0500  10/16/17 0409 10/17/17 0606  Weight: 80.1 kg (176 lb 9.4 oz) 85.7 kg (189 lb) 82.1 kg (181 lb)   Body mass index is 29.21 kg/m.   General appearance : Does not follow commands-appears comfortable not in any distress HEENT: Tracheostomy in place Resp:Good air entry bilaterally, some scattered upper airway sounds heard anteriorly CVS: S1 S2 regular, no murmurs.  GI: Bowel sounds present, Non tender and not distended-PEG tube in place Neurology: Contracted-nonverbal-no further exam positive Musculoskeletal:No digital cyanosis Skin:No Rash, warm and dry  I have personally reviewed following labs and imaging studies  LABORATORY DATA: CBC: Recent Labs  Lab 10/11/17 0757 10/12/17 0109 10/13/17 0524 10/14/17 0356 10/15/17 0345 10/16/17 0442 10/17/17 0445  WBC 18.5* 15.3* 17.6* 16.8* 15.4* 14.6* 15.0*  NEUTROABS 14.1* 12.3*  --   --  11.3*  --  11.5*  HGB 11.1* 10.1* 9.4* 9.6* 8.9* 9.2* 9.7*  HCT 36.1 34.0* 29.4* 30.7* 28.3* 29.6* 31.3*  MCV 96.5 97.1 92.7 93.0 94.0 94.6 93.2  PLT 312 261 232 214  192 212 536    Basic Metabolic Panel: Recent Labs  Lab 10/13/17 0524 10/14/17 0401 10/15/17 0345 10/16/17 0442 10/17/17 0445  NA 147* 150* 153* 150* 149*  K 3.4* 4.3 4.2 4.2 4.5  CL 120* 126* 127* 121* 120*  CO2 16* 14* 16* 18* 19*  GLUCOSE 218* 145* 93 175* 124*  BUN 148* 123* 99* 86* 76*  CREATININE 1.71* 1.44* 1.28* 1.14* 1.08*  CALCIUM 8.6* 9.0 9.2 9.3 9.5  MG  --  1.9 1.8 1.8 1.8  PHOS  --  5.6* 5.3* 5.1*  --     GFR: Estimated Creatinine Clearance: 59.8 mL/min (A) (by C-G formula based on SCr of 1.08 mg/dL (H)).  Liver Function Tests: No results for input(s): AST, ALT, ALKPHOS, BILITOT, PROT, ALBUMIN in the last 168 hours. No results for input(s): LIPASE, AMYLASE in the last 168 hours. No results for input(s): AMMONIA in the last 168 hours.  Coagulation Profile: Recent Labs  Lab 10/13/17 0524 10/14/17 0401 10/15/17 0345 10/16/17 0442 10/17/17 0445  INR  1.61 1.43 1.53 1.51 1.54    Cardiac Enzymes: No results for input(s): CKTOTAL, CKMB, CKMBINDEX, TROPONINI in the last 168 hours.  BNP (last 3 results) No results for input(s): PROBNP in the last 8760 hours.  HbA1C: No results for input(s): HGBA1C in the last 72 hours.  CBG: Recent Labs  Lab 10/16/17 2109 10/17/17 0023 10/17/17 0514 10/17/17 0734 10/17/17 1156  GLUCAP 202* 200* 114* 110* 167*    Lipid Profile: No results for input(s): CHOL, HDL, LDLCALC, TRIG, CHOLHDL, LDLDIRECT in the last 72 hours.  Thyroid Function Tests: No results for input(s): TSH, T4TOTAL, FREET4, T3FREE, THYROIDAB in the last 72 hours.  Anemia Panel: No results for input(s): VITAMINB12, FOLATE, FERRITIN, TIBC, IRON, RETICCTPCT in the last 72 hours.  Urine analysis:    Component Value Date/Time   COLORURINE YELLOW 10/08/2017 [place] 10/08/2017 1143   LABSPEC 1.012 10/08/2017 1143   PHURINE 7.0 10/08/2017 1143   GLUCOSEU NEGATIVE 10/08/2017 1143   GLUCOSEU > 1000 mg/dL (A) 11/01/2007 2056   HGBUR MODERATE (A) 10/08/2017 1143   HGBUR negative 07/15/2007 0954   BILIRUBINUR NEGATIVE 10/08/2017 1143   KETONESUR NEGATIVE 10/08/2017 1143   PROTEINUR 30 (A) 10/08/2017 1143   UROBILINOGEN 1.0 04/09/2012 1524   NITRITE NEGATIVE 10/08/2017 1143   LEUKOCYTESUR LARGE (A) 10/08/2017 1143    Sepsis Labs: Lactic Acid, Venous    Component Value Date/Time   LATICACIDVEN 1.8 08/05/2017 1237    MICROBIOLOGY: Recent Results (from the past 240 hour(s))  MRSA PCR Screening     Status: None   Collection Time: 10/08/17 11:36 AM  Result Value Ref Range Status   MRSA by PCR NEGATIVE NEGATIVE Final    Comment:        The GeneXpert MRSA Assay (FDA approved for NASAL specimens only), is one component of a comprehensive MRSA colonization surveillance program. It is not intended to diagnose MRSA infection nor to guide or monitor treatment for MRSA infections. Performed at [facility], [place] [address] [place], [place] [address]   Urine culture     Status: Abnormal   Collection Time: 10/08/17 11:43 AM  Result Value Ref Range Status   Specimen Description   Final    URINE, RANDOM Performed at [place] [address], [place], [place] [address]    Special Requests   Final    NONE Performed at [facility], [address] [place], [place] [address]    Culture (A)  Final    >=100,000 COLONIES/mL ESCHERICHIA COLI >=100,000 COLONIES/mL KLEBSIELLA PNEUMONIAE    Report Status 10/11/2017 FINAL  Final   Organism ID, Bacteria ESCHERICHIA COLI (A)  Final   Organism ID, Bacteria KLEBSIELLA PNEUMONIAE (A)  Final      Susceptibility   Escherichia coli - MIC*    AMPICILLIN >=32 RESISTANT Resistant     CEFAZOLIN 16 SENSITIVE Sensitive     CEFTRIAXONE <=1 SENSITIVE Sensitive     CIPROFLOXACIN >=4 RESISTANT Resistant     GENTAMICIN <=1 SENSITIVE Sensitive     IMIPENEM <=0.25 SENSITIVE Sensitive     NITROFURANTOIN <=16 SENSITIVE Sensitive     TRIMETH/SULFA <=20 SENSITIVE Sensitive     AMPICILLIN/SULBACTAM >=32 RESISTANT Resistant     PIP/TAZO 8 SENSITIVE Sensitive     Extended ESBL NEGATIVE Sensitive     * >=100,000 COLONIES/mL ESCHERICHIA COLI   Klebsiella pneumoniae - MIC*    AMPICILLIN >=32 RESISTANT Resistant     CEFAZOLIN <=4 SENSITIVE Sensitive     CEFTRIAXONE <=1 SENSITIVE Sensitive     CIPROFLOXACIN <=0.25 SENSITIVE Sensitive     GENTAMICIN <=1 SENSITIVE Sensitive     IMIPENEM <=0.25 SENSITIVE Sensitive     NITROFURANTOIN 128 RESISTANT Resistant     TRIMETH/SULFA <=20 SENSITIVE Sensitive     AMPICILLIN/SULBACTAM 16 INTERMEDIATE Intermediate     PIP/TAZO 8 SENSITIVE Sensitive     Extended ESBL NEGATIVE Sensitive     * >=100,000 COLONIES/mL KLEBSIELLA PNEUMONIAE  C difficile quick scan w PCR reflex     Status: None   Collection Time: 10/14/17  8:44 AM  Result Value Ref Range Status   C Diff antigen  NEGATIVE NEGATIVE Final   C Diff toxin NEGATIVE NEGATIVE Final   C Diff interpretation No C. difficile detected.  Final    Comment: Performed at [facility], [place] [address] [place],  [address]    RADIOLOGY STUDIES/RESULTS: Ct Abdomen Pelvis Wo Contrast  Result Date: 10/08/2017 CLINICAL DATA:  Hypotension, low hemoglobin, leukocytosis. EXAM: CT ABDOMEN AND PELVIS WITHOUT CONTRAST TECHNIQUE: Multidetector CT imaging of the abdomen and pelvis was performed following the standard protocol without IV contrast. COMPARISON:  08/15/2017. FINDINGS: Lower chest: Lung bases show mild volume loss in the right lower lobe. Heart is enlarged. Atherosclerotic calcification of the arterial vasculature, including coronary arteries. No pericardial or pleural effusion. Hepatobiliary: Image quality is degraded by streak artifact from external monitoring devices. Liver and gallbladder are grossly unremarkable. No biliary ductal dilatation. Pancreas: Image quality is degraded by motion. Grossly unremarkable. Spleen: Grossly unremarkable. Adrenals/Urinary Tract: Image quality is degraded by motion. Adrenal glands and kidneys are grossly unremarkable. Renal vascular calcifications on the right. Bladder is grossly unremarkable. Stomach/Bowel: Percutaneous gastrostomy. Stomach, small bowel, appendix and colon are otherwise grossly unremarkable. Vascular/Lymphatic: Diffuse atherosclerotic calcification of the arterial vasculature without abdominal aortic aneurysm. Scattered lymph nodes are not enlarged by CT size criteria. Reproductive: Uterus is visualized.  No adnexal mass. Other: No free fluid. There are rounded areas of hyper attenuation and stranding along the subcutaneous low right flank, as well as overlying the right iliac wing, some of which are partially imaged. These measure at least 2.7 x 3.0 cm. Musculoskeletal: Degenerative changes in the spine. No worrisome lytic or sclerotic lesions.  IMPRESSION: 1. Probable subcutaneous hematomas in the low right flank and overlying the right iliac wing. Please correlate for a history of trauma. No evidence of internal hemorrhage in the  abdomen or pelvis. 2. Image quality is degraded by motion and streak artifact. 3. Aortic atherosclerosis (ICD10-170.0). Coronary artery calcification. Electronically Signed   By: Lorin Picket M.D.   On: 10/08/2017 08:17   Dg Chest Port 1 View  Result Date: 10/14/2017 CLINICAL DATA:  62 year old female with shortness of breath EXAM: PORTABLE CHEST 1 VIEW COMPARISON:  Prior chest x-ray 10/12/2017 FINDINGS: Tracheostomy tube remains in stable position with the tip midline and at the level of the clavicles. Stable mild cardiomegaly. Slightly lower inspiratory volumes with increasing streaky perihilar and bibasilar airspace opacities likely reflecting atelectasis. No overt edema, focal consolidation, pleural effusion or pneumothorax. No acute osseous abnormality. 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 IMPRESSION: 1. Lower inspiratory volumes with increasing perihilar and bibasilar atelectasis. 2. Stable cardiomegaly. Electronically Signed   By: Jacqulynn Cadet M.D.   On: 10/14/2017 10:37   Dg Chest Port 1 View  Result Date: 10/12/2017 CLINICAL DATA:  Shortness of breath. EXAM: PORTABLE CHEST 1 VIEW COMPARISON:  Radiograph of October 08, 2017. FINDINGS: Tracheostomy is in good position. Stable cardiomediastinal silhouette. No pneumothorax or pleural effusion is noted. Right lung is clear. Stable lingular opacity is noted concerning for atelectasis or scarring. Degenerative changes seen involving the right glenohumeral joint. IMPRESSION: Stable lingular opacity is noted concerning for atelectasis or scarring. No significant change compared to prior exam. Electronically Signed   By: Marijo Conception, M.D.   On: 10/12/2017 10:29   Dg Chest Port 1 View  Result Date: 10/08/2017 CLINICAL DATA:  62 year old  female with shortness of breath. EXAM: PORTABLE CHEST 1 VIEW COMPARISON:  Chest radiograph dated 09/08/2017 FINDINGS: Tracheostomy above the carina. Bibasilar linear and streaky atelectatic changes with interval improvement compared to prior radiograph. There is no focal consolidation, pleural effusion, or pneumothorax. Mild cardiomegaly. No acute osseous pathology. IMPRESSION: 1. Tracheostomy above the carina. 2. Interval improvement of bibasilar atelectasis/scarring. Electronically Signed   By: Anner Crete M.D.   On: 10/08/2017 04:49   Dg Shoulder Left  Result Date: 10/11/2017 CLINICAL DATA:  Recent fall, nonverbal patient EXAM: LEFT SHOULDER - 2+ VIEW COMPARISON:  Chest x-ray of 10/08/2017 FINDINGS: There is degenerative change involving the acromion with some spurring present. However no fracture is seen. The left humeral head is in normal position and the glenohumeral joint space appears normal. The left clavicle appears intact. Tracheostomy is present. IMPRESSION: 1. Degenerative change of the acromion possibly leading to impingement. 2. No fracture. Electronically Signed   By: Ivar Drape M.D.   On: 10/11/2017 11:06     LOS: 9 days   Oren Binet, MD  [facility] Pager:[phone]  If 7PM-7AM, please contact night-coverage [email or web address] Password [email or web address] 10/17/2017, 2:25 PM

## 2017-10-18 DIAGNOSIS — E870 Hyperosmolality and hypernatremia: Secondary | ICD-10-CM

## 2017-10-18 LAB — BASIC METABOLIC PANEL
ANION GAP: 13 (ref 5–15)
BUN: 69 mg/dL — ABNORMAL HIGH (ref 6–20)
CHLORIDE: 116 mmol/L — AB (ref 101–111)
CO2: 18 mmol/L — AB (ref 22–32)
Calcium: 9.3 mg/dL (ref 8.9–10.3)
Creatinine, Ser: 1.08 mg/dL — ABNORMAL HIGH (ref 0.44–1.00)
GFR calc Af Amer: >60 mL/min (ref >60)
GFR calc non Af Amer: 55 mL/min — ABNORMAL LOW (ref >60)
Glucose, Bld: 105 mg/dL — ABNORMAL HIGH (ref 65–99)
Potassium: 4.4 mmol/L (ref 3.5–5.1)
Sodium: 147 mmol/L — ABNORMAL HIGH (ref 135–145)

## 2017-10-18 LAB — GLUCOSE, CAPILLARY
GLUCOSE-CAPILLARY: 158 mg/dL — AB (ref 65–99)
Glucose-Capillary: 97 mg/dL (ref 65–99)
Glucose-Capillary: 147 mg/dL — ABNORMAL HIGH (ref 65–99)

## 2017-10-18 LAB — PROTIME-INR
INR: 1.65
Prothrombin Time: 19.3 seconds — ABNORMAL HIGH (ref 11.4–15.2)

## 2017-10-18 MED ORDER — WARFARIN SODIUM 4 MG PO TABS: 8.0000 mg | ORAL_TABLET | Freq: Every day | ORAL | Status: DC

## 2017-10-18 MED ORDER — INSULIN GLARGINE 100 UNIT/ML ~~LOC~~ SOLN: 40.0000 [IU] | Freq: Two times a day (BID) | SUBCUTANEOUS | Status: DC

## 2017-10-18 MED ORDER — LORAZEPAM 2 MG/ML IJ SOLN: 1.0000 mg | INTRAMUSCULAR | 0 refills | Status: AC | PRN

## 2017-10-18 NOTE — Progress Notes (Addendum)
ANTICOAGULATION CONSULT NOTE - follow up  Pharmacy Consult for warfarin Indication: h/o DVT  No Known Allergies  Patient Measurements: Height: 5\' 6"  (167.6 cm) Weight: 177 lb (80.3 kg) IBW/kg (Calculated) : 59.3  Vital Signs: Temp: 98.7 F (37.1 C) (04/11 0533) Temp Source: Oral (04/11 0533) BP: 155/93 (04/11 0533) Pulse Rate: 88 (04/11 0533)  Labs: Recent Labs    10/16/17 0442 10/17/17 0445 10/18/17 0432  HGB 9.2* 9.7*  --   HCT 29.6* 31.3*  --   PLT 212 196  --   LABPROT 18.1* 18.4* 19.3*  INR 1.51 1.54 1.65  CREATININE 1.14* 1.08* 1.08*    Estimated Creatinine Clearance: 59.2 mL/min (A) (by C-G formula based on SCr of 1.08 mg/dL (H)).  Assessment: 55 YOF presenting 4/1 with bleeding from tracheostomy site.  She was also found to have subcutaneous hematoma of R flank. She is on warfarin for history of DVT. She is on warfarin 8mg  PO daily prior to admission. Her last dose of warfarin was 3/31. Warfarin has been held since admission but Hgb/Hct remains stable, so pharmacy asked to resume 4/6.   Today, 10/18/2017  INR remains SUBtherapeutic at 1.65 but trending in right direction after 4 mg x 1 and 4 doses of 8 mg, pt is a slow responder as evidenced by prolonged period of time w/o requiring warfarin dose and INR > 2 ( was held 4/1- 4/5)  CBC: Hgb 9.7 stable, Plts stable  No known active bleeding  Goal of Therapy:  INR 2-3, prefer 2-2.5 with recent bleeding events  Plan:   Resume home dose Warfarin 8mg  q1800  Daily INR  Monitor for bleeding  Eudelia Bunch, Pharm.D. [phone] 10/18/2017 7:39 AM

## 2017-10-18 NOTE — Discharge Summary (Signed)
PATIENT DETAILS Name: [name] Age: 62 y.o. Sex: female Date of Birth: 1956/01/22 MRN: [id number]. Admitting Physician: Norval Morton, MD MVE:[id number], Christean Grief, MD  Admit Date: 10/08/2017 Discharge date: 10/18/2017  Recommendations for Outpatient Follow-up:  1. Follow up with PCP in 1-2 weeks 2. Please check INR in 2 days and adjust dosage of Coumadin accordingly 3. Please recheck chemistry panel in 2 days 4. Continue routine trach care, PEG care and routine Foley care while at SNF 5. Please ensure followed by palliative care while at SNF  Admitted From:  SNF  Disposition: SNF   Home Health: No  Equipment/Devices: None  Discharge Condition: Stable  CODE STATUS: FULL CODE  Diet recommendation:  NPO on peg feeds  Brief Summary: See H&P, Labs, Consult and Test reports for all details in brief, Patient is a 62 y.o. female summary with history of PEG tube/tracheostomy/indwelling Foley catheter-bedbound-nonverbal-prior history of intracranial hemorrhage, acute lacunar infarction, DM-2, right upper extremity DVT on anticoagulation-admitted initially for bleeding around the tracheostomy site, upon further evaluation in the emergency room-CT scan of the abdomen showed a subcutaneous hematoma in the lower right flank/right iliac wing-patient was then admitted to the hospitalist service.  Hospital course has been complicated by diarrhea, hyponatremia and metabolic acidosis.  See below for further details   Brief Hospital Course: Acute blood loss anemia secondary to subcutaneous hematoma in the right lower abdominal area: Hemoglobin levels have stabilized-patient is status post 2 units of PRBC.  No signs of bleeding at this time-warfarin has been resumed  Hypernatremia: Much better-improving with using free water via PEG.  Only mild hyponatremia-suspect it will continue to improve as long as patient gets free water via the PEG tube.  We will recheck electrolytes in the next  few days while at [facility].   Sepsis secondary to E. coli UTI: This is pathophysiology has resolved-has mild persistent leukocytosis-has completed a course of antimicrobial therapy.  Urine culture positive for E. coli and Klebsiella pneumoniae.  Acute kidney injury on chronic kidney disease stage III: Acute kidney injury likely hemodynamically mediated-resolved with supportive care.  Metabolic acidosis: Secondary to diarrhea-resolving.  Recheck electrolytes periodically  Diarrhea: Supportive care-seems to have resolved.  C. difficile PCR negative.  Hypertension: Controlled-continue labetalol, clonidine and amlodipine.  Insulin-dependent DM-2: CBG stable with Lantus 40 units twice daily,  and SSI.  Seizure disorder: Continue Vimpat, Keppra and Topamax.  No seizures during this hospital stay  Right upper extremity DVT: Coumadin-INR still subtherapeutic but slowly improving.  Please recheck INR in 2 days and adjust dosing of Coumadin accordingly.  Unstageable sacral wound: Present prior to admission-continue wound care per wound care team while at SNF  Nonverbal/bedbound/PEG tube/tracheostomy/chronic indwelling Foley catheter-prior history of CVA/thalamic hemorrhage in 2018, recent history of status epilepticus and worsening encephalopathy- very poor prognosis overall.  Suggest palliative care follow-up at SNF  Procedures/Studies: None  Discharge Diagnoses:  Active Problems:   Symptomatic anemia   Sepsis (HCC)   AKI (acute kidney injury) ([place])   Metabolic acidemia   Discharge Instructions:  Activity:  As tolerated with Full fall precautions use walker/cane & assistance as needed   Discharge Instructions    Increase activity slowly   Complete by:  As directed      Allergies as of 10/18/2017   No Known Allergies     Medication List    STOP taking these medications   enalapril 10 MG tablet Commonly known as:  VASOTEC   enoxaparin 80 MG/0.8ML injection Commonly  known as:  LOVENOX   furosemide 40 MG tablet Commonly known as:  LASIX     TAKE these medications   acetaminophen 160 MG/5ML solution Commonly known as:  TYLENOL Place 10.2 mLs (325 mg total) into feeding tube every 6 (six) hours as needed for mild pain, headache or fever.   albuterol (2.5 MG/3ML) 0.083% nebulizer solution Commonly known as:  PROVENTIL Take 3 mLs (2.5 mg total) by nebulization every 2 (two) hours as needed for wheezing.   amLODipine 10 MG tablet Commonly known as:  NORVASC Place 1 tablet (10 mg total) into feeding tube daily.   aspirin 81 MG chewable tablet Place 1 tablet (81 mg total) into feeding tube daily.   atorvastatin 40 MG tablet Commonly known as:  LIPITOR Place 1 tablet (40 mg total) into feeding tube daily at 6 PM.   cloNIDine 0.2 MG tablet Commonly known as:  CATAPRES Place 1 tablet (0.2 mg total) into feeding tube 3 (three) times daily.   collagenase ointment Commonly known as:  SANTYL Apply topically daily. Apply Santyl to right leg wound Q day, then cover with moist gauze and dry gauze and kerlex   docusate 50 MG/5ML liquid Commonly known as:  COLACE Place 5 mLs (50 mg total) into feeding tube 2 (two) times daily as needed for mild constipation.   nutrition supplement (JUVEN) Pack Place 1 packet into feeding tube 2 (two) times daily between meals.   feeding supplement (JEVITY 1.2 CAL) Liqd Place 1,000 mLs into feeding tube continuous.   feeding supplement (PRO-STAT SUGAR FREE 64) Liqd Take 30 mLs by mouth 2 (two) times daily.   feeding supplement (PRO-STAT SUGAR FREE 64) Liqd Place 30 mLs into feeding tube 3 (three) times daily.   folic acid 1 MG tablet Commonly known as:  FOLVITE Take 1 tablet (1 mg total) by mouth daily.   hydrALAZINE 100 MG tablet Commonly known as:  APRESOLINE Place 1 tablet (100 mg total) into feeding tube every 8 (eight) hours.   insulin glargine 100 UNIT/ML injection Commonly known as:  LANTUS Inject  0.4 mLs (40 Units total) into the skin 2 (two) times daily.   insulin lispro 100 UNIT/ML injection Commonly known as:  HUMALOG Inject 4-10 Units into the skin 4 (four) times daily. Per sliding scale   labetalol 200 MG tablet Commonly known as:  NORMODYNE Place 2 tablets (400 mg total) into feeding tube 3 (three) times daily.   lacosamide 200 MG Tabs tablet Commonly known as:  VIMPAT Place 1 tablet (200 mg total) into feeding tube 2 (two) times daily.   levETIRAcetam 100 MG/ML solution Commonly known as:  KEPPRA Place 10 mLs (1,000 mg total) into feeding tube 2 (two) times daily.   LORazepam 2 MG/ML injection Commonly known as:  ATIVAN Inject 0.5 mLs (1 mg total) into the vein as needed for seizure (Give if seizure lasts longer than 5 minutes).   multivitamin with minerals Tabs tablet Take 1 tablet by mouth daily.   nystatin cream Commonly known as:  MYCOSTATIN Apply topically 2 (two) times daily.   pantoprazole sodium 40 mg/20 mL Pack Commonly known as:  PROTONIX Place 20 mLs (40 mg total) into feeding tube at bedtime.   phenytoin 125 MG/5ML suspension Commonly known as:  DILANTIN Place 250 mg into feeding tube 2 (two) times daily.   scopolamine 1 MG/3DAYS Commonly known as:  TRANSDERM-SCOP Place 1 patch (1.5 mg total) onto the skin every 3 (three) days.   thiamine 100 MG tablet Place 1  tablet (100 mg total) into feeding tube daily.   topiramate 200 MG tablet Commonly known as:  TOPAMAX Place 1 tablet (200 mg total) into feeding tube 2 (two) times daily.   warfarin 4 MG tablet Commonly known as:  COUMADIN Take 8 mg by mouth daily at 6 PM. Via feeding tube      Follow-up Information    Nolene Ebbs, MD. Schedule an appointment as soon as possible for a visit in 1 week(s).   Specialty:  Internal Medicine Contact information: [place] [address] [phone]          No Known Allergies  Consultations:   None  Other  Procedures/Studies: Ct Abdomen Pelvis Wo Contrast  Result Date: 10/08/2017 CLINICAL DATA:  Hypotension, low hemoglobin, leukocytosis. EXAM: CT ABDOMEN AND PELVIS WITHOUT CONTRAST TECHNIQUE: Multidetector CT imaging of the abdomen and pelvis was performed following the standard protocol without IV contrast. COMPARISON:  08/15/2017. FINDINGS: Lower chest: Lung bases show mild volume loss in the right lower lobe. Heart is enlarged. Atherosclerotic calcification of the arterial vasculature, including coronary arteries. No pericardial or pleural effusion. Hepatobiliary: Image quality is degraded by streak artifact from external monitoring devices. Liver and gallbladder are grossly unremarkable. No biliary ductal dilatation. Pancreas: Image quality is degraded by motion. Grossly unremarkable. Spleen: Grossly unremarkable. Adrenals/Urinary Tract: Image quality is degraded by motion. Adrenal glands and kidneys are grossly unremarkable. Renal vascular calcifications on the right. Bladder is grossly unremarkable. Stomach/Bowel: Percutaneous gastrostomy. Stomach, small bowel, appendix and colon are otherwise grossly unremarkable. Vascular/Lymphatic: Diffuse atherosclerotic calcification of the arterial vasculature without abdominal aortic aneurysm. Scattered lymph nodes are not enlarged by CT size criteria. Reproductive: Uterus is visualized.  No adnexal mass. Other: No free fluid. There are rounded areas of hyper attenuation and stranding along the subcutaneous low right flank, as well as overlying the right iliac wing, some of which are partially imaged. These measure at least 2.7 x 3.0 cm. Musculoskeletal: Degenerative changes in the spine. No worrisome lytic or sclerotic lesions. IMPRESSION: 1. Probable subcutaneous hematomas in the low right flank and overlying the right iliac wing. Please correlate for a history of trauma. No evidence of internal hemorrhage in the abdomen or pelvis. 2. Image quality is degraded by  motion and streak artifact. 3. Aortic atherosclerosis (ICD10-170.0). Coronary artery calcification. Electronically Signed   By: Lorin Picket M.D.   On: 10/08/2017 08:17   Dg Chest Port 1 View  Result Date: 10/14/2017 CLINICAL DATA:  62 year old female with shortness of breath EXAM: PORTABLE CHEST 1 VIEW COMPARISON:  Prior chest x-ray 10/12/2017 FINDINGS: Tracheostomy tube remains in stable position with the tip midline and at the level of the clavicles. Stable mild cardiomegaly. Slightly lower inspiratory volumes with increasing streaky perihilar and bibasilar airspace opacities likely reflecting atelectasis. No overt edema, focal consolidation, pleural effusion or pneumothorax. No acute osseous abnormality. 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 IMPRESSION: 1. Lower inspiratory volumes with increasing perihilar and bibasilar atelectasis. 2. Stable cardiomegaly. Electronically Signed   By: Jacqulynn Cadet M.D.   On: 10/14/2017 10:37   Dg Chest Port 1 View  Result Date: 10/12/2017 CLINICAL DATA:  Shortness of breath. EXAM: PORTABLE CHEST 1 VIEW COMPARISON:  Radiograph of October 08, 2017. FINDINGS: Tracheostomy is in good position. Stable cardiomediastinal silhouette. No pneumothorax or pleural effusion is noted. Right lung is clear. Stable lingular opacity is noted concerning for atelectasis or scarring. Degenerative changes seen involving the right glenohumeral joint. IMPRESSION: Stable lingular opacity is noted  concerning for atelectasis or scarring. No significant change compared to prior exam. Electronically Signed   By: Marijo Conception, M.D.   On: 10/12/2017 10:29   Dg Chest Port 1 View  Result Date: 10/08/2017 CLINICAL DATA:  62 year old female with shortness of breath. EXAM: PORTABLE CHEST 1 VIEW COMPARISON:  Chest radiograph dated 09/08/2017 FINDINGS: Tracheostomy above the carina. Bibasilar linear and streaky atelectatic changes with interval improvement compared to prior  radiograph. There is no focal consolidation, pleural effusion, or pneumothorax. Mild cardiomegaly. No acute osseous pathology. IMPRESSION: 1. Tracheostomy above the carina. 2. Interval improvement of bibasilar atelectasis/scarring. Electronically Signed   By: Anner Crete M.D.   On: 10/08/2017 04:49   Dg Shoulder Left  Result Date: 10/11/2017 CLINICAL DATA:  Recent fall, nonverbal patient EXAM: LEFT SHOULDER - 2+ VIEW COMPARISON:  Chest x-ray of 10/08/2017 FINDINGS: There is degenerative change involving the acromion with some spurring present. However no fracture is seen. The left humeral head is in normal position and the glenohumeral joint space appears normal. The left clavicle appears intact. Tracheostomy is present. IMPRESSION: 1. Degenerative change of the acromion possibly leading to impingement. 2. No fracture. Electronically Signed   By: Ivar Drape M.D.   On: 10/11/2017 11:06      TODAY-DAY OF DISCHARGE:  Subjective:   Dorianna Mcneish today remains stable overnight  Objective:   Blood pressure (!) 155/93, pulse 87, temperature 98.7 F (37.1 C), temperature source Oral, resp. rate 19, height 5\' 6"  (1.676 m), weight 80.3 kg (177 lb), SpO2 98 %.  Intake/Output Summary (Last 24 hours) at 10/18/2017 1018 Last data filed at 10/18/2017 0555 Gross per 24 hour  Intake 2175.84 ml  Output 1850 ml  Net 325.84 ml   Filed Weights   10/16/17 0409 10/17/17 0606 10/18/17 0555  Weight: 85.7 kg (189 lb) 82.1 kg (181 lb) 80.3 kg (177 lb)    Exam: Awake but does not speak-does not follow commands.   Supple Neck.  Symmetrical Chest wall movement, Good air movement bilaterally, transmitted upper airway sounds RRR,No Gallops,Rubs or new Murmurs, No Parasternal Heave +ve B.Sounds, Abd Soft, Non tender,  No rebound -guarding or rigidity. No Cyanosis, Clubbing or edema, No new Rash or bruise   PERTINENT RADIOLOGIC STUDIES: Ct Abdomen Pelvis Wo Contrast  Result Date: 10/08/2017 CLINICAL  DATA:  Hypotension, low hemoglobin, leukocytosis. EXAM: CT ABDOMEN AND PELVIS WITHOUT CONTRAST TECHNIQUE: Multidetector CT imaging of the abdomen and pelvis was performed following the standard protocol without IV contrast. COMPARISON:  08/15/2017. FINDINGS: Lower chest: Lung bases show mild volume loss in the right lower lobe. Heart is enlarged. Atherosclerotic calcification of the arterial vasculature, including coronary arteries. No pericardial or pleural effusion. Hepatobiliary: Image quality is degraded by streak artifact from external monitoring devices. Liver and gallbladder are grossly unremarkable. No biliary ductal dilatation. Pancreas: Image quality is degraded by motion. Grossly unremarkable. Spleen: Grossly unremarkable. Adrenals/Urinary Tract: Image quality is degraded by motion. Adrenal glands and kidneys are grossly unremarkable. Renal vascular calcifications on the right. Bladder is grossly unremarkable. Stomach/Bowel: Percutaneous gastrostomy. Stomach, small bowel, appendix and colon are otherwise grossly unremarkable. Vascular/Lymphatic: Diffuse atherosclerotic calcification of the arterial vasculature without abdominal aortic aneurysm. Scattered lymph nodes are not enlarged by CT size criteria. Reproductive: Uterus is visualized.  No adnexal mass. Other: No free fluid. There are rounded areas of hyper attenuation and stranding along the subcutaneous low right flank, as well as overlying the right iliac wing, some of which are partially imaged. These measure  at least 2.7 x 3.0 cm. Musculoskeletal: Degenerative changes in the spine. No worrisome lytic or sclerotic lesions. IMPRESSION: 1. Probable subcutaneous hematomas in the low right flank and overlying the right iliac wing. Please correlate for a history of trauma. No evidence of internal hemorrhage in the abdomen or pelvis. 2. Image quality is degraded by motion and streak artifact. 3. Aortic atherosclerosis (ICD10-170.0). Coronary artery  calcification. Electronically Signed   By: Lorin Picket M.D.   On: 10/08/2017 08:17   Dg Chest Port 1 View  Result Date: 10/14/2017 CLINICAL DATA:  62 year old female with shortness of breath EXAM: PORTABLE CHEST 1 VIEW COMPARISON:  Prior chest x-ray 10/12/2017 FINDINGS: Tracheostomy tube remains in stable position with the tip midline and at the level of the clavicles. Stable mild cardiomegaly. Slightly lower inspiratory volumes with increasing streaky perihilar and bibasilar airspace opacities likely reflecting atelectasis. No overt edema, focal consolidation, pleural effusion or pneumothorax. No acute osseous abnormality. 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 IMPRESSION: 1. Lower inspiratory volumes with increasing perihilar and bibasilar atelectasis. 2. Stable cardiomegaly. Electronically Signed   By: Jacqulynn Cadet M.D.   On: 10/14/2017 10:37   Dg Chest Port 1 View  Result Date: 10/12/2017 CLINICAL DATA:  Shortness of breath. EXAM: PORTABLE CHEST 1 VIEW COMPARISON:  Radiograph of October 08, 2017. FINDINGS: Tracheostomy is in good position. Stable cardiomediastinal silhouette. No pneumothorax or pleural effusion is noted. Right lung is clear. Stable lingular opacity is noted concerning for atelectasis or scarring. Degenerative changes seen involving the right glenohumeral joint. IMPRESSION: Stable lingular opacity is noted concerning for atelectasis or scarring. No significant change compared to prior exam. Electronically Signed   By: Marijo Conception, M.D.   On: 10/12/2017 10:29   Dg Chest Port 1 View  Result Date: 10/08/2017 CLINICAL DATA:  62 year old female with shortness of breath. EXAM: PORTABLE CHEST 1 VIEW COMPARISON:  Chest radiograph dated 09/08/2017 FINDINGS: Tracheostomy above the carina. Bibasilar linear and streaky atelectatic changes with interval improvement compared to prior radiograph. There is no focal consolidation, pleural effusion, or pneumothorax. Mild  cardiomegaly. No acute osseous pathology. IMPRESSION: 1. Tracheostomy above the carina. 2. Interval improvement of bibasilar atelectasis/scarring. Electronically Signed   By: Anner Crete M.D.   On: 10/08/2017 04:49   Dg Shoulder Left  Result Date: 10/11/2017 CLINICAL DATA:  Recent fall, nonverbal patient EXAM: LEFT SHOULDER - 2+ VIEW COMPARISON:  Chest x-ray of 10/08/2017 FINDINGS: There is degenerative change involving the acromion with some spurring present. However no fracture is seen. The left humeral head is in normal position and the glenohumeral joint space appears normal. The left clavicle appears intact. Tracheostomy is present. IMPRESSION: 1. Degenerative change of the acromion possibly leading to impingement. 2. No fracture. Electronically Signed   By: Ivar Drape M.D.   On: 10/11/2017 11:06     PERTINENT LAB RESULTS: CBC: Recent Labs    10/16/17 0442 10/17/17 0445  WBC 14.6* 15.0*  HGB 9.2* 9.7*  HCT 29.6* 31.3*  PLT 212 196   CMET CMP     Component Value Date/Time   NA 147 (H) 10/18/2017 0432   K 4.4 10/18/2017 0432   CL 116 (H) 10/18/2017 0432   CO2 18 (L) 10/18/2017 0432   GLUCOSE 105 (H) 10/18/2017 0432   BUN 69 (H) 10/18/2017 0432   CREATININE 1.08 (H) 10/18/2017 0432   CREATININE 1.28 (H) 03/27/2017 1615   CALCIUM 9.3 10/18/2017 0432   PROT 8.0 10/08/2017 0353   ALBUMIN 2.6 (  L) 10/08/2017 0353   AST 35 10/08/2017 0353   ALT 26 10/08/2017 0353   ALKPHOS 45 10/08/2017 0353   BILITOT 0.5 10/08/2017 0353   GFRNONAA 55 (L) 10/18/2017 0432   GFRNONAA 45 (L) 03/27/2017 1615   GFRAA >60 10/18/2017 0432   GFRAA 53 (L) 03/27/2017 1615    GFR Estimated Creatinine Clearance: 59.2 mL/min (A) (by C-G formula based on SCr of 1.08 mg/dL (H)). No results for input(s): LIPASE, AMYLASE in the last 72 hours. No results for input(s): CKTOTAL, CKMB, CKMBINDEX, TROPONINI in the last 72 hours. Invalid input(s): POCBNP No results for input(s): DDIMER in the last 72  hours. No results for input(s): HGBA1C in the last 72 hours. No results for input(s): CHOL, HDL, LDLCALC, TRIG, CHOLHDL, LDLDIRECT in the last 72 hours. No results for input(s): TSH, T4TOTAL, T3FREE, THYROIDAB in the last 72 hours.  Invalid input(s): FREET3 No results for input(s): VITAMINB12, FOLATE, FERRITIN, TIBC, IRON, RETICCTPCT in the last 72 hours. Coags: Recent Labs    10/17/17 0445 10/18/17 0432  INR 1.54 1.65   Microbiology: Recent Results (from the past 240 hour(s))  MRSA PCR Screening     Status: None   Collection Time: 10/08/17 11:36 AM  Result Value Ref Range Status   MRSA by PCR NEGATIVE NEGATIVE Final    Comment:        The GeneXpert MRSA Assay (FDA approved for NASAL specimens only), is one component of a comprehensive MRSA colonization surveillance program. It is not intended to diagnose MRSA infection nor to guide or monitor treatment for MRSA infections. Performed at [facility], [place] [address], [place], [place] [address]   Urine culture     Status: Abnormal   Collection Time: 10/08/17 11:43 AM  Result Value Ref Range Status   Specimen Description   Final    URINE, RANDOM Performed at [facility] [address], [place], [place] [address]    Special Requests   Final    NONE Performed at [facility], [place] [address] [place], [place] [address]    Culture (A)  Final    >=100,000 COLONIES/mL ESCHERICHIA COLI >=100,000 COLONIES/mL KLEBSIELLA PNEUMONIAE    Report Status 10/11/2017 FINAL  Final   Organism ID, Bacteria ESCHERICHIA COLI (A)  Final   Organism ID, Bacteria KLEBSIELLA PNEUMONIAE (A)  Final      Susceptibility   Escherichia coli - MIC*    AMPICILLIN >=32 RESISTANT Resistant     CEFAZOLIN 16 SENSITIVE Sensitive     CEFTRIAXONE <=1 SENSITIVE Sensitive     CIPROFLOXACIN >=4 RESISTANT Resistant     GENTAMICIN <=1 SENSITIVE Sensitive     IMIPENEM <=0.25 SENSITIVE Sensitive      NITROFURANTOIN <=16 SENSITIVE Sensitive     TRIMETH/SULFA <=20 SENSITIVE Sensitive     AMPICILLIN/SULBACTAM >=32 RESISTANT Resistant     PIP/TAZO 8 SENSITIVE Sensitive     Extended ESBL NEGATIVE Sensitive     * >=100,000 COLONIES/mL ESCHERICHIA COLI   Klebsiella pneumoniae - MIC*    AMPICILLIN >=32 RESISTANT Resistant     CEFAZOLIN <=4 SENSITIVE Sensitive     CEFTRIAXONE <=1 SENSITIVE Sensitive     CIPROFLOXACIN <=0.25 SENSITIVE Sensitive     GENTAMICIN <=1 SENSITIVE Sensitive     IMIPENEM <=0.25 SENSITIVE Sensitive     NITROFURANTOIN 128 RESISTANT Resistant     TRIMETH/SULFA <=20 SENSITIVE Sensitive     AMPICILLIN/SULBACTAM 16 INTERMEDIATE Intermediate     PIP/TAZO 8 SENSITIVE Sensitive  Extended ESBL NEGATIVE Sensitive     * >=100,000 COLONIES/mL KLEBSIELLA PNEUMONIAE  C difficile quick scan w PCR reflex     Status: None   Collection Time: 10/14/17  8:44 AM  Result Value Ref Range Status   C Diff antigen NEGATIVE NEGATIVE Final   C Diff toxin NEGATIVE NEGATIVE Final   C Diff interpretation No C. difficile detected.  Final    Comment: Performed at [facility], [place] [address] [place], [place] [address]    FURTHER DISCHARGE INSTRUCTIONS:  Get Medicines reviewed and adjusted: Please take all your medications with you for your next visit with your Primary MD  Laboratory/radiological data: Please request your Primary MD to go over all hospital tests and procedure/radiological results at the follow up, please ask your Primary MD to get all Hospital records sent to his/her office.  In some cases, they will be blood work, cultures and biopsy results pending at the time of your discharge. Please request that your primary care M.D. goes through all the records of your hospital data and follows up on these results.  Also Note the following: If you experience worsening of your admission symptoms, develop shortness of breath, life threatening emergency, suicidal or  homicidal thoughts you must seek medical attention immediately by calling 911 or calling your MD immediately  if symptoms less severe.  You must read complete instructions/literature along with all the possible adverse reactions/side effects for all the Medicines you take and that have been prescribed to you. Take any new Medicines after you have completely understood and accpet all the possible adverse reactions/side effects.   Do not drive when taking Pain medications or sleeping medications (Benzodaizepines)  Do not take more than prescribed Pain, Sleep and Anxiety Medications. It is not advisable to combine anxiety,sleep and pain medications without talking with your primary care practitioner  Special Instructions: If you have smoked or chewed Tobacco  in the last 2 yrs please stop smoking, stop any regular Alcohol  and or any Recreational drug use.  Wear Seat belts while driving.  Please note: You were cared for by a hospitalist during your hospital stay. Once you are discharged, your primary care physician will handle any further medical issues. Please note that NO REFILLS for any discharge medications will be authorized once you are discharged, as it is imperative that you return to your primary care physician (or establish a relationship with a primary care physician if you do not have one) for your post hospital discharge needs so that they can reassess your need for medications and monitor your lab values.  Total Time spent coordinating discharge including counseling, education and face to face time equals 45 minutes.  SignedOren Binet 10/18/2017 10:18 AM

## 2017-10-18 NOTE — Progress Notes (Signed)
PT has been transported to [facility] via [place]

## 2017-11-05 ENCOUNTER — Encounter (HOSPITAL_COMMUNITY): Payer: Self-pay | Admitting: Emergency Medicine

## 2017-11-05 ENCOUNTER — Inpatient Hospital Stay (HOSPITAL_COMMUNITY)
Admission: EM | Admit: 2017-11-05 | Discharge: 2017-11-16 | DRG: 377 | Disposition: A | Discharge status: Skilled Nursing Facility | Payer: Medicaid Other | Attending: Internal Medicine | Admitting: Internal Medicine

## 2017-11-05 ENCOUNTER — Emergency Department (HOSPITAL_COMMUNITY): Payer: Medicaid Other

## 2017-11-05 ENCOUNTER — Other Ambulatory Visit: Payer: Self-pay

## 2017-11-05 DIAGNOSIS — S301XXA Contusion of abdominal wall, initial encounter: Secondary | ICD-10-CM | POA: Diagnosis present

## 2017-11-05 DIAGNOSIS — Z7189 Other specified counseling: Secondary | ICD-10-CM

## 2017-11-05 DIAGNOSIS — E785 Hyperlipidemia, unspecified: Secondary | ICD-10-CM | POA: Diagnosis present

## 2017-11-05 DIAGNOSIS — J181 Lobar pneumonia, unspecified organism: Secondary | ICD-10-CM | POA: Diagnosis present

## 2017-11-05 DIAGNOSIS — Z79899 Other long term (current) drug therapy: Secondary | ICD-10-CM

## 2017-11-05 DIAGNOSIS — N179 Acute kidney failure, unspecified: Secondary | ICD-10-CM | POA: Diagnosis present

## 2017-11-05 DIAGNOSIS — Z794 Long term (current) use of insulin: Secondary | ICD-10-CM

## 2017-11-05 DIAGNOSIS — Y95 Nosocomial condition: Secondary | ICD-10-CM | POA: Diagnosis present

## 2017-11-05 DIAGNOSIS — Z823 Family history of stroke: Secondary | ICD-10-CM

## 2017-11-05 DIAGNOSIS — J189 Pneumonia, unspecified organism: Secondary | ICD-10-CM | POA: Diagnosis present

## 2017-11-05 DIAGNOSIS — Z930 Tracheostomy status: Secondary | ICD-10-CM

## 2017-11-05 DIAGNOSIS — E870 Hyperosmolality and hypernatremia: Secondary | ICD-10-CM | POA: Diagnosis present

## 2017-11-05 DIAGNOSIS — N19 Unspecified kidney failure: Secondary | ICD-10-CM

## 2017-11-05 DIAGNOSIS — G939 Disorder of brain, unspecified: Secondary | ICD-10-CM | POA: Diagnosis present

## 2017-11-05 DIAGNOSIS — R403 Persistent vegetative state: Secondary | ICD-10-CM | POA: Diagnosis present

## 2017-11-05 DIAGNOSIS — Z7982 Long term (current) use of aspirin: Secondary | ICD-10-CM

## 2017-11-05 DIAGNOSIS — IMO0002 Reserved for concepts with insufficient information to code with codable children: Secondary | ICD-10-CM | POA: Diagnosis present

## 2017-11-05 DIAGNOSIS — R195 Other fecal abnormalities: Secondary | ICD-10-CM | POA: Diagnosis present

## 2017-11-05 DIAGNOSIS — Z8673 Personal history of transient ischemic attack (TIA), and cerebral infarction without residual deficits: Secondary | ICD-10-CM

## 2017-11-05 DIAGNOSIS — D509 Iron deficiency anemia, unspecified: Secondary | ICD-10-CM | POA: Diagnosis present

## 2017-11-05 DIAGNOSIS — E878 Other disorders of electrolyte and fluid balance, not elsewhere classified: Secondary | ICD-10-CM | POA: Diagnosis present

## 2017-11-05 DIAGNOSIS — Z931 Gastrostomy status: Secondary | ICD-10-CM

## 2017-11-05 DIAGNOSIS — D62 Acute posthemorrhagic anemia: Secondary | ICD-10-CM | POA: Diagnosis present

## 2017-11-05 DIAGNOSIS — L89153 Pressure ulcer of sacral region, stage 3: Secondary | ICD-10-CM | POA: Diagnosis present

## 2017-11-05 DIAGNOSIS — Z515 Encounter for palliative care: Secondary | ICD-10-CM

## 2017-11-05 DIAGNOSIS — I69319 Unspecified symptoms and signs involving cognitive functions following cerebral infarction: Secondary | ICD-10-CM

## 2017-11-05 DIAGNOSIS — K922 Gastrointestinal hemorrhage, unspecified: Principal | ICD-10-CM | POA: Diagnosis present

## 2017-11-05 DIAGNOSIS — Z7901 Long term (current) use of anticoagulants: Secondary | ICD-10-CM

## 2017-11-05 DIAGNOSIS — Z86718 Personal history of other venous thrombosis and embolism: Secondary | ICD-10-CM

## 2017-11-05 DIAGNOSIS — D649 Anemia, unspecified: Secondary | ICD-10-CM

## 2017-11-05 DIAGNOSIS — N183 Chronic kidney disease, stage 3 unspecified: Secondary | ICD-10-CM | POA: Diagnosis present

## 2017-11-05 DIAGNOSIS — E872 Acidosis: Secondary | ICD-10-CM | POA: Diagnosis present

## 2017-11-05 DIAGNOSIS — X58XXXA Exposure to other specified factors, initial encounter: Secondary | ICD-10-CM | POA: Diagnosis present

## 2017-11-05 DIAGNOSIS — G40909 Epilepsy, unspecified, not intractable, without status epilepticus: Secondary | ICD-10-CM | POA: Diagnosis present

## 2017-11-05 DIAGNOSIS — E118 Type 2 diabetes mellitus with unspecified complications: Secondary | ICD-10-CM

## 2017-11-05 DIAGNOSIS — I5032 Chronic diastolic (congestive) heart failure: Secondary | ICD-10-CM | POA: Diagnosis present

## 2017-11-05 DIAGNOSIS — G4700 Insomnia, unspecified: Secondary | ICD-10-CM | POA: Diagnosis present

## 2017-11-05 DIAGNOSIS — I130 Hypertensive heart and chronic kidney disease with heart failure and stage 1 through stage 4 chronic kidney disease, or unspecified chronic kidney disease: Secondary | ICD-10-CM | POA: Diagnosis present

## 2017-11-05 DIAGNOSIS — I10 Essential (primary) hypertension: Secondary | ICD-10-CM | POA: Diagnosis present

## 2017-11-05 DIAGNOSIS — Z833 Family history of diabetes mellitus: Secondary | ICD-10-CM

## 2017-11-05 DIAGNOSIS — F17210 Nicotine dependence, cigarettes, uncomplicated: Secondary | ICD-10-CM | POA: Diagnosis present

## 2017-11-05 DIAGNOSIS — E1165 Type 2 diabetes mellitus with hyperglycemia: Secondary | ICD-10-CM | POA: Diagnosis present

## 2017-11-05 DIAGNOSIS — A419 Sepsis, unspecified organism: Secondary | ICD-10-CM | POA: Diagnosis present

## 2017-11-05 DIAGNOSIS — E1122 Type 2 diabetes mellitus with diabetic chronic kidney disease: Secondary | ICD-10-CM | POA: Diagnosis present

## 2017-11-05 LAB — CBC WITH DIFFERENTIAL/PLATELET
BASOS ABS: 0 10*3/uL (ref 0.0–0.1)
BASOS PCT: 0 %
EOS ABS: 0.5 10*3/uL (ref 0.0–0.7)
EOS PCT: 3 %
HCT: 22.6 % — ABNORMAL LOW (ref 36.0–46.0)
Hemoglobin: 7.1 g/dL — ABNORMAL LOW (ref 12.0–15.0)
Lymphocytes Relative: 18 %
Lymphs Abs: 3.3 10*3/uL (ref 0.7–4.0)
MCH: 29.8 pg (ref 26.0–34.0)
MCHC: 31.4 g/dL (ref 30.0–36.0)
MCV: 95 fL (ref 78.0–100.0)
MONO ABS: 0.6 10*3/uL (ref 0.1–1.0)
Monocytes Relative: 3 %
Neutro Abs: 14.2 10*3/uL — ABNORMAL HIGH (ref 1.7–7.7)
Neutrophils Relative %: 76 %
PLATELETS: 255 10*3/uL (ref 150–400)
RBC: 2.38 MIL/uL — AB (ref 3.87–5.11)
RDW: 17.4 % — AB (ref 11.5–15.5)
WBC: 18.6 10*3/uL — AB (ref 4.0–10.5)

## 2017-11-05 LAB — PROTIME-INR
INR: 2.44
PROTHROMBIN TIME: 26.3 s — AB (ref 11.4–15.2)

## 2017-11-05 LAB — POC OCCULT BLOOD, ED: FECAL OCCULT BLD: POSITIVE — AB

## 2017-11-05 LAB — I-STAT CG4 LACTIC ACID, ED: LACTIC ACID, VENOUS: 0.94 mmol/L (ref 0.5–1.9)

## 2017-11-05 LAB — PREPARE RBC (CROSSMATCH)

## 2017-11-05 MED ORDER — IOPAMIDOL (ISOVUE-300) INJECTION 61%
INTRAVENOUS | Status: AC
  Filled 2017-11-05: qty 100

## 2017-11-05 MED ORDER — SODIUM CHLORIDE 0.9 % IV SOLN
Freq: Once | INTRAVENOUS | Status: AC
  Administered 2017-11-06: 02:00:00 via INTRAVENOUS

## 2017-11-05 MED ORDER — SODIUM CHLORIDE 0.9 % IV SOLN
2.0000 g | INTRAVENOUS | Status: AC
  Administered 2017-11-06: 2 g via INTRAVENOUS
  Filled 2017-11-05: qty 2

## 2017-11-05 MED ORDER — SODIUM CHLORIDE 0.9 % IJ SOLN
INTRAMUSCULAR | Status: AC
  Filled 2017-11-05: qty 50

## 2017-11-05 MED ORDER — VANCOMYCIN HCL 10 G IV SOLR
2000.0000 mg | Freq: Once | INTRAVENOUS | Status: AC
  Administered 2017-11-06: 2000 mg via INTRAVENOUS
  Filled 2017-11-05: qty 2000

## 2017-11-05 NOTE — ED Notes (Signed)
Bed: OH60 Expected date:  Expected time:  Means of arrival:  Comments: EMS from SNF low Hgb 7.1/trach patient

## 2017-11-05 NOTE — ED Notes (Signed)
Paperwork from [facility] shows Hgb of 7.1 that was drawn at 1200 today.

## 2017-11-05 NOTE — ED Provider Notes (Addendum)
[facility] DEPT Provider Note   CSN: [id number] Arrival date & time: 11/05/17  2022     History   Chief Complaint Chief Complaint  Patient presents with  . abnormal labs    HPI [name] is a 62 y.o. female.  HPI   62 year old female with complicated recent history including admission for anemia thought secondary to abdominal wall hematomas comp located by UTI with sepsis and kidney injury here with abnormal labs.  Patient arise from a nursing facility.  She is chronically trach dependent.  Per report, patient has been having serial hemoglobins done at her facility.  She was found to have a hemoglobin of 7.1 today and was subsequently sent here for evaluation.  Patient is nonverbal at baseline and unable to provide much history.  Level 5 caveat invoked as remainder of history, ROS, and physical exam limited due to patient's non-verbal status   Past Medical History:  Diagnosis Date  . Acute deep vein thrombosis (DVT) of right upper extremity ([place]) 08/30/2017  . Chronic diastolic CHF (congestive heart failure) ([place]) 08/30/2017  . Cocaine abuse, continuous ([place])   . Diabetes mellitus    Hb A1C = 12.6 on 05/15/11, managed on Novolog 70/30, 35 U qam, 25 U qpm  . Hypertension    poorly controlled  . Insomnia disorder   . Shortness of breath   . Stroke [facility])     Patient Active Problem List   Diagnosis Date Noted  . AKI (acute kidney injury) ([place])   . Metabolic acidemia   . Symptomatic anemia 10/08/2017  . Sepsis ([place]) 10/08/2017  . Acute bilat watershed infarction [facility])   . Acute respiratory failure with hypoxia ([place])   . Essential hypertension   . Diabetes mellitus type 2, uncontrolled, with complications ([place])   . Chronic diastolic CHF (congestive heart failure) ([place]) 08/30/2017  . Acute deep vein thrombosis (DVT) of right upper extremity ([place]) 08/30/2017  . Tracheostomy status ([place])   . Acute respiratory failure with hypoxemia ([place])    . Pressure injury of skin 08/04/2017  . Altered mental status   . H/O open leg wound 06/04/2017  . Open leg wound 06/04/2017  . Acute metabolic encephalopathy 70/07/7492  . Intracranial bleed ([place]) 05/26/2017  . ICH (intracerebral hemorrhage) ([place]) 05/26/2017  . Type II diabetes mellitus with renal manifestations ([place]) 05/25/2017  . SIRS (systemic inflammatory response syndrome) ([place]) 05/25/2017  . Aspiration pneumonia due to gastric secretions ([place])   . Confusion 05/10/2017  . Chronic hepatitis C without hepatic coma ([place])   . Labile blood glucose   . Hypoglycemia associated with type 2 diabetes mellitus ([place])   . Poorly controlled type 2 diabetes mellitus with peripheral neuropathy ([place])   . Neurologic gait disorder   . Neuropathic pain   . Type 2 diabetes mellitus with peripheral neuropathy (HCC)   . History of intracranial hemorrhage   . Diarrhea   . Urinary incontinence   . Hypokalemia   . Polysubstance abuse ([place])   . Cerebrovascular accident (CVA) ([place])   . Disorientation   . Meningitis 11/02/2016  . Type 2 diabetes mellitus with hyperosmolar nonketotic hyperglycemia ([place]) 10/31/2016  . Seizure ([place]) 10/31/2016  . ARF (acute renal failure) ([place]) 10/31/2016  . Severe sepsis ([place]) 10/31/2016  . DKA (diabetic ketoacidoses) ([place]) 10/31/2016  . Acute renal failure superimposed on stage 3 chronic kidney disease ([place])   . Right hemiparesis ([place]) 06/09/2016  . 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 Cytotoxic brain edema ([place])  06/08/2016  . Encephalopathy acute 06/06/2016  . Chest pain, musculoskeletal 05/16/2011  . Vaginal candidiasis 05/16/2011  . Diabetes mellitus, type 2 ([place]) 05/16/2011  . CHEST PAIN 10/01/2009  . CELLULITIS AND ABSCESS OF LEG EXCEPT FOOT 08/31/2009  . INSOMNIA 07/13/2008  . MRSA 04/17/2008  . NECK MASS 04/17/2008  . COCAINE ABUSE 11/01/2007  . OBESITY NOS 07/14/2006  . DENTAL CARIES 07/14/2006  . DIABETES MELLITUS, TYPE II 04/26/2006  . Alcohol  abuse 04/26/2006  . TOBACCO ABUSE 04/26/2006  . DEPRESSION 04/26/2006  . Uncontrolled hypertension 04/26/2006  . GERD 04/26/2006  . LOW BACK PAIN 04/26/2006    Past Surgical History:  Procedure Laterality Date  . ANKLE FRACTURE SURGERY  2007  . IR GASTROSTOMY TUBE MOD SED  08/17/2017     OB History   None      Home Medications    Prior to Admission medications   Medication Sig Start Date End Date Taking? Authorizing Provider  acetaminophen (TYLENOL) 160 MG/5ML solution Place 10.2 mLs (325 mg total) into feeding tube every 6 (six) hours as needed for mild pain, headache or fever. 09/13/17  Yes Allie Bossier, MD  albuterol (PROVENTIL) (2.5 MG/3ML) 0.083% nebulizer solution Take 3 mLs (2.5 mg total) by nebulization every 2 (two) hours as needed for wheezing. 09/13/17  Yes Allie Bossier, MD  Amino Acids-Protein Hydrolys (FEEDING SUPPLEMENT, PRO-STAT SUGAR FREE 64,) LIQD Place 30 mLs into feeding tube 3 (three) times daily. Patient taking differently: Place 30 mLs into feeding tube 2 (two) times daily.  09/13/17  Yes Allie Bossier, MD  amLODipine (NORVASC) 10 MG tablet Place 1 tablet (10 mg total) into feeding tube daily. 09/14/17  Yes Allie Bossier, MD  aspirin 81 MG chewable tablet Place 1 tablet (81 mg total) into feeding tube daily. 09/14/17  Yes Allie Bossier, MD  atorvastatin (LIPITOR) 40 MG tablet Place 1 tablet (40 mg total) into feeding tube daily at 6 PM. 09/13/17  Yes Allie Bossier, MD  cloNIDine (CATAPRES) 0.2 MG tablet Place 1 tablet (0.2 mg total) into feeding tube 3 (three) times daily. 09/13/17  Yes Allie Bossier, MD  docusate (COLACE) 50 MG/5ML liquid Place 5 mLs (50 mg total) into feeding tube 2 (two) times daily as needed for mild constipation. 09/13/17  Yes Allie Bossier, MD  folic acid (FOLVITE) 1 MG tablet Take 1 tablet (1 mg total) by mouth daily. 06/05/17  Yes Aline August, MD  hydrALAZINE (APRESOLINE) 100 MG tablet Place 1 tablet (100 mg total) into feeding tube  every 8 (eight) hours. 09/13/17  Yes Allie Bossier, MD  insulin glargine (LANTUS) 100 UNIT/ML injection Inject 0.4 mLs (40 Units total) into the skin 2 (two) times daily. 10/18/17  Yes Ghimire, Henreitta Leber, MD  insulin lispro (HUMALOG) 100 UNIT/ML injection Inject 2-10 Units into the skin 4 (four) times daily. Per sliding scale. 201-250=2 units, 251-300=4 units, 301-350=6 units, 351-400=8 units, 401-450=10 units > 450 Call MD.   Yes [provider]  labetalol (NORMODYNE) 200 MG tablet Place 2 tablets (400 mg total) into feeding tube 3 (three) times daily. 09/13/17  Yes Allie Bossier, MD  lacosamide (VIMPAT) 200 MG TABS tablet Place 1 tablet (200 mg total) into feeding tube 2 (two) times daily. 09/13/17  Yes Allie Bossier, MD  levETIRAcetam (KEPPRA) 100 MG/ML solution Place 10 mLs (1,000 mg total) into feeding tube 2 (two) times daily. 09/13/17  Yes Allie Bossier, MD  levofloxacin [facility]) 500  MG tablet Place 500 mg into feeding tube daily. For 7 Days.   Yes [provider]  LORazepam (ATIVAN) 2 MG/ML injection Inject 0.5 mLs (1 mg total) into the vein as needed for seizure (Give if seizure lasts longer than 5 minutes). 10/18/17  Yes Ghimire, Henreitta Leber, MD  Multiple Vitamin (MULTIVITAMIN+) LIQD Take 15 mLs by mouth daily.   Yes [provider]  nutrition supplement, JUVEN, (JUVEN) PACK Place 1 packet into feeding tube 2 (two) times daily between meals.   Yes [provider]  pantoprazole sodium (PROTONIX) 40 mg/20 mL PACK Place 20 mLs (40 mg total) into feeding tube at bedtime. 09/13/17  Yes Allie Bossier, MD  phenytoin (DILANTIN) 125 MG/5ML suspension Place 250 mg into feeding tube 2 (two) times daily.   Yes [provider]  scopolamine (TRANSDERM-SCOP) 1 MG/3DAYS Place 1 patch (1.5 mg total) onto the skin every 3 (three) days. 09/14/17  Yes Allie Bossier, MD  thiamine 100 MG tablet Place 1 tablet (100 mg total) into feeding tube daily. 09/14/17  Yes Allie Bossier, MD  topiramate (TOPAMAX) 200 MG tablet Place 1 tablet (200 mg total) into feeding tube 2 (two) times daily. 09/13/17  Yes Allie Bossier, MD  warfarin (COUMADIN) 4 MG tablet Take 8 mg by mouth daily at 6 PM. Via feeding tube   Yes [provider]  collagenase (SANTYL) ointment Apply topically daily. Apply Santyl to right leg wound Q day, then cover with moist gauze and dry gauze and kerlex Patient not taking: Reported on 10/08/2017 06/05/17   Aline August, MD  Multiple Vitamin (MULTIVITAMIN WITH MINERALS) TABS tablet Take 1 tablet by mouth daily. Patient not taking: Reported on 11/05/2017 06/20/16   Cathlyn Parsons, PA-C  Nutritional Supplements (FEEDING SUPPLEMENT, JEVITY 1.2 CAL,) LIQD Place 1,000 mLs into feeding tube continuous. Patient not taking: Reported on 10/08/2017 09/13/17   Allie Bossier, MD  nystatin cream (MYCOSTATIN) Apply topically 2 (two) times daily. Patient not taking: Reported on 10/08/2017 06/04/17   Aline August, MD    Family History Family History  Problem Relation Age of Onset  . Stroke Father   . Diabetes Sister   . Diabetes Brother     Social History Social History   Tobacco Use  . Smoking status: Current Every Day Smoker    Packs/day: 1.50    Years: 25.00    Pack years: 37.50    Types: Cigarettes  . Smokeless tobacco: Never Used  Substance Use Topics  . Alcohol use: Yes    Alcohol/week: 4.8 oz    Types: 2 Glasses of wine, 6 Cans of beer per week    Comment: pint of liquor a week  . Drug use: Yes    Types: Cocaine    Comment: 07/2015 last use      Allergies   Patient has no known allergies.   Review of Systems Review of Systems  Unable to perform ROS: Patient nonverbal     Physical Exam Updated Vital Signs BP 128/69   Pulse 98   Temp 98.4 F (36.9 C) (Rectal)   Resp (!) 27   Ht 5\' 6"  (1.676 m)   Wt 80.3 kg (177 lb)   SpO2 98%   BMI 28.57 kg/m   Physical Exam  Constitutional: She appears well-developed and  well-nourished. She has a sickly appearance. No distress.  Trach in place, alert but non-verbal  HENT:  Head: Normocephalic and atraumatic.  Eyes: Conjunctivae are normal.  Neck: Neck supple.  Cardiovascular: Normal rate, regular rhythm and normal heart sounds. Exam reveals no friction rub.  No murmur heard. Pulmonary/Chest: Effort normal. No respiratory distress. She has no wheezes. She has rales in the right lower field and the left lower field.  Abdominal: Soft. Normal appearance. She exhibits no distension.  G-tube is in place, no redness.   Genitourinary:  Genitourinary Comments: Brown stool, no overt bleeding. Stage III/IV decub ulcer, no deep tracking or purulence.  Musculoskeletal: She exhibits no edema.  Neurological: She is alert.  Skin: Skin is warm. Capillary refill takes less than 2 seconds.  Psychiatric: She has a normal mood and affect.  Nursing note and vitals reviewed.    ED Treatments / Results  Labs (all labs ordered are listed, but only abnormal results are displayed) Labs Reviewed  CBC WITH DIFFERENTIAL/PLATELET - Abnormal; Notable for the following components:      Result Value   WBC 18.6 (*)    RBC 2.38 (*)    Hemoglobin 7.1 (*)    HCT 22.6 (*)    RDW 17.4 (*)    Neutro Abs 14.2 (*)    All other components within normal limits  BASIC METABOLIC PANEL - Abnormal; Notable for the following components:   CO2 20 (*)    Glucose, Bld 214 (*)    BUN 298 (*)    Creatinine, Ser 2.48 (*)    GFR calc non Af Amer 20 (*)    GFR calc Af Amer 23 (*)    All other components within normal limits  PROTIME-INR - Abnormal; Notable for the following components:   Prothrombin Time 26.3 (*)    All other components within normal limits  POC OCCULT BLOOD, ED - Abnormal; Notable for the following components:   Fecal Occult Bld POSITIVE (*)    All other components within normal limits  CULTURE, RESPIRATORY (NON-EXPECTORATED)  CULTURE, BLOOD (ROUTINE X 2)  CULTURE, BLOOD  (ROUTINE X 2)  BASIC METABOLIC PANEL  I-STAT CG4 LACTIC ACID, ED  TYPE AND SCREEN  PREPARE RBC (CROSSMATCH)  PREPARE FRESH FROZEN PLASMA    EKG None  Radiology Ct Abdomen Pelvis Wo Contrast  Result Date: 11/05/2017 CLINICAL DATA:  Anemia, abdominal distention EXAM: CT ABDOMEN AND PELVIS WITHOUT CONTRAST TECHNIQUE: Multidetector CT imaging of the abdomen and pelvis was performed following the standard protocol without IV contrast. COMPARISON:  10/08/2017 FINDINGS: Lower chest: Respiratory motion at the lung bases. Mild patchy right lower lobe opacities, favored to reflect a combination of atelectasis and mild pneumonia anteriorly (series 4/image 16). Hepatobiliary: Unenhanced liver is grossly unremarkable. Gallbladder is unremarkable. No intrahepatic extrahepatic ductal dilatation. Pancreas: Within normal limits. Spleen: Within normal limits. Adrenals/Urinary Tract: Adrenal glands are within normal limits. 3 mm nonobstructing right lower pole renal calculus (series 2/image 40). Left kidney is within normal limits. No hydronephrosis. Bladder is within normal limits. Stomach/Bowel: Percutaneous gastrostomy along the anterior gastric antrum. No evidence of bowel obstruction. Normal appendix (series 2/image 56). Left colonic diverticulosis, without evidence of diverticulitis. Vascular/Lymphatic: No evidence of abdominal aortic aneurysm. Atherosclerotic calcifications of the abdominal aorta and branch vessels. No suspicious abdominopelvic lymphadenopathy. Reproductive: Uterus is within normal limits. No adnexal masses. Other: No abdominopelvic ascites. Musculoskeletal: 3.4 cm resolving hematoma/seroma overlying the right flank (series 2/image 64). Minimal subcutaneous stranding/resolving hematoma overlying the left lower abdomen. No evidence of acute retroperitoneal hematoma. Mild degenerative changes of the visualized thoracolumbar spine. IMPRESSION: No evidence of acute retroperitoneal hematoma. 3.4 cm  resolving hematoma/seroma overlying the  right flank, improved. Mild patchy right lower lobe opacities, favored to reflect a combination of atelectasis and right lower lobe pneumonia. Additional ancillary findings as above. Electronically Signed   By: Julian Hy M.D.   On: 11/05/2017 23:20   Dg Chest Portable 1 View  Result Date: 11/05/2017 CLINICAL DATA:  Cough.  Low hemoglobin.  Tracheostomy patient. EXAM: PORTABLE CHEST 1 VIEW COMPARISON:  10/14/2017 FINDINGS: Tracheostomy tube remains in place. Shallow inspiration. Linear atelectasis or fibrosis in the left mid lung is unchanged since previous study. There is new area of consolidation in the right lung base. This may indicate developing pneumonia or increasing atelectasis. No pleural effusions. No pneumothorax. Mediastinal contours appear intact. Heart size and pulmonary vascularity are normal. Calcification of the aorta. Degenerative changes in the spine and shoulders. IMPRESSION: New area of consolidation in the right lung base may represent developing pneumonia or increasing atelectasis. Persistent fibrosis or atelectasis in the left mid lung without change. Electronically Signed   By: Lucienne Capers M.D.   On: 11/05/2017 23:50    Procedures .Critical Care Performed by: Duffy Bruce, MD Authorized by: Duffy Bruce, MD   Critical care provider statement:    Critical care time (minutes):  45   Critical care time was exclusive of:  Separately billable procedures and treating other patients and teaching time   Critical care was necessary to treat or prevent imminent or life-threatening deterioration of the following conditions:  Circulatory failure, cardiac failure, respiratory failure, sepsis and dehydration   Critical care was time spent personally by me on the following activities:  Development of treatment plan with patient or surrogate, discussions with consultants, evaluation of patient's response to treatment, examination of  patient, obtaining history from patient or surrogate, ordering and performing treatments and interventions, ordering and review of laboratory studies, ordering and review of radiographic studies, pulse oximetry, re-evaluation of patient's condition and review of old charts   I assumed direction of critical care for this patient from another provider in my specialty: no     (including critical care time)   Emergency Ultrasound Study:   Angiocath insertion Performed by: Evonnie Pat Consent: Verbal consent/emergent consent obtained. Risks and benefits: risks, benefits and alternatives were discussed Immediately prior to procedure the correct patient, procedure, equipment, support staff and site/side marked as needed.  Indication: difficult IV access Preparation: Patient was prepped and draped in the usual sterile fashion. Sterile gel was used for this procedure and the ultrasound probe was sterilized prior to use. Vein Location: Right forearm vein was visualized during assessment for potential access sites and was found to be patent/ easily compressed with linear ultrasound.  The needle was visualized with real-time ultrasound and guided into the vein. Gauge: 18  Image saved and stored.  Normal blood return.   Patient tolerance: Patient tolerated the procedure well with no immediate complications.       Medications Ordered in ED Medications  0.9 %  sodium chloride infusion (has no administration in time range)  iopamidol (ISOVUE-300) 61 % injection (has no administration in time range)  sodium chloride 0.9 % injection (has no administration in time range)  ceFEPIme (MAXIPIME) 2 g in sodium chloride 0.9 % 100 mL IVPB (has no administration in time range)  vancomycin (VANCOCIN) 2,000 mg in sodium chloride 0.9 % 500 mL IVPB (has no administration in time range)  sodium chloride 0.9 % bolus 1,000 mL (has no administration in time range)  phenytoin (DILANTIN) 125 MG/5ML suspension 250  mg (has  no administration in time range)  levETIRAcetam (KEPPRA) 100 MG/ML solution 1,000 mg (has no administration in time range)  topiramate (TOPAMAX) tablet 200 mg (has no administration in time range)  pantoprazole (PROTONIX) 80 mg in sodium chloride 0.9 % 100 mL IVPB (has no administration in time range)  pantoprazole (PROTONIX) injection 40 mg (has no administration in time range)  0.9 %  sodium chloride infusion (has no administration in time range)  phytonadione (VITAMIN K) 10 mg in dextrose 5 % 50 mL IVPB (has no administration in time range)  pantoprazole (PROTONIX) 80 mg in sodium chloride 0.9 % 250 mL (0.32 mg/mL) infusion (has no administration in time range)     Initial Impression / Assessment and Plan / ED Course  I have reviewed the triage vital signs and the nursing notes.  Pertinent labs & imaging results that were available during my care of the patient were reviewed by me and considered in my medical decision making (see chart for details).     62 yo F with complex PMHx including recent admission for anemia 2/2 abd wall hematoma, UTI with sepsis, here with decreasing hgb on SNF lab work. On arrival here, pt appears non-toxic, at mental baseline per records. Lab work confirms Hgb 7.1, down from >9 on discharge. No active bleeding. Hemoccult positive but not grossly melanotic. Will transfuse 1u pRBC, monitor closely. Pt also with b/l rales, R>L, on exam and increasing leukocytosis. CT scan obtained - shows stable hematomas but concern for RLL PNA. Will cover empirically. Pt also with recurrent AKI.   Suspect acute on chronic anemia 2/2 likely slow GIB, also component of recurrent renal injury. Admit.  Tried to contact Daughter Tiffany without success. Given previous consent for blood, emergent condition with Hgb 7.1, will transfuse 1u PRBC with emergent consent.  BMP with BUN >200. In setting of clinical picture, suspect UGIB with uremia, AKI. Discussed with Dr Joelyn Oms of  Nephrology, who agrees with plan to give fluids, blood, place foley, and monitor. Likely multifactorial.  1.) Anemia - Likely 2/2 UGIB on coumadin, ? Stress ulcer in setting of recent stressors, also maybe a component of CKD and anemia of CD - GI consulted, d/w Dr. Rosalie Gums - will see pt - PPI bolus and gtt started - Coumadin reversed with FFP, Vit K - no active bleeding at this time, HDS - Transfuse 2u PRBC, FFP as above  2.) AKI with marked Uremia - Likely multifactorial 2/2 anemia, dehydration, ? Sepsis - Nephro consulted, Dr. Joelyn Oms, who agrees with plan of care, OK to [facility] - Fluids, transfuse - Foley to monitor UOP - Lytes otherwise acceptable  3.) PNA on CXR and CT - Broad-spectrum HCAP covergae - BCx sent - Baseline trach settings, resp quant cx sent - Trach care  4.) Seizure disorder - No seizure activity here - Home meds ordered to prevent break through seizures   Final Clinical Impressions(s) / ED Diagnoses   Final diagnoses:  Acute on chronic anemia  HCAP (healthcare-associated pneumonia)  AKI (acute kidney injury) ([place])  Uremia    ED Discharge Orders    None       Duffy Bruce, MD 11/06/17 Ezequiel Ganser    Duffy Bruce, MD 11/06/17 Vita Barley    Duffy Bruce, MD 11/06/17 [phone]

## 2017-11-05 NOTE — ED Triage Notes (Signed)
PT presents by EMS from [facility] for abnormal lab results of a low hemoglobin. Pt on trach collar mask and awake at this time.

## 2017-11-05 NOTE — ED Notes (Signed)
Pt daughter called to obtain consent for Blood transfusion due to pt being unable to sign.

## 2017-11-05 NOTE — Progress Notes (Signed)
A consult was received from an ED physician for cefepime and vancomycin per pharmacy dosing.  The patient's profile has been reviewed for ht/wt/allergies/indication/available labs.   A one time order has been placed for Cefepime 2 Gm and Vancomycin 2 Gm.  Further antibiotics/pharmacy consults should be ordered by admitting physician if indicated.                       Thank you, Dorrene German 11/05/2017  11:50 PM

## 2017-11-06 ENCOUNTER — Encounter (HOSPITAL_COMMUNITY): Payer: Self-pay | Admitting: Internal Medicine

## 2017-11-06 DIAGNOSIS — E1122 Type 2 diabetes mellitus with diabetic chronic kidney disease: Secondary | ICD-10-CM | POA: Diagnosis present

## 2017-11-06 DIAGNOSIS — E118 Type 2 diabetes mellitus with unspecified complications: Secondary | ICD-10-CM | POA: Diagnosis not present

## 2017-11-06 DIAGNOSIS — N179 Acute kidney failure, unspecified: Secondary | ICD-10-CM

## 2017-11-06 DIAGNOSIS — I130 Hypertensive heart and chronic kidney disease with heart failure and stage 1 through stage 4 chronic kidney disease, or unspecified chronic kidney disease: Secondary | ICD-10-CM | POA: Diagnosis present

## 2017-11-06 DIAGNOSIS — J189 Pneumonia, unspecified organism: Secondary | ICD-10-CM | POA: Diagnosis present

## 2017-11-06 DIAGNOSIS — G4700 Insomnia, unspecified: Secondary | ICD-10-CM | POA: Diagnosis present

## 2017-11-06 DIAGNOSIS — E1165 Type 2 diabetes mellitus with hyperglycemia: Secondary | ICD-10-CM

## 2017-11-06 DIAGNOSIS — D509 Iron deficiency anemia, unspecified: Secondary | ICD-10-CM | POA: Diagnosis present

## 2017-11-06 DIAGNOSIS — R195 Other fecal abnormalities: Secondary | ICD-10-CM | POA: Diagnosis not present

## 2017-11-06 DIAGNOSIS — A419 Sepsis, unspecified organism: Secondary | ICD-10-CM | POA: Diagnosis present

## 2017-11-06 DIAGNOSIS — N19 Unspecified kidney failure: Secondary | ICD-10-CM | POA: Diagnosis not present

## 2017-11-06 DIAGNOSIS — I10 Essential (primary) hypertension: Secondary | ICD-10-CM

## 2017-11-06 DIAGNOSIS — E785 Hyperlipidemia, unspecified: Secondary | ICD-10-CM | POA: Diagnosis present

## 2017-11-06 DIAGNOSIS — D649 Anemia, unspecified: Secondary | ICD-10-CM | POA: Diagnosis present

## 2017-11-06 DIAGNOSIS — S301XXA Contusion of abdominal wall, initial encounter: Secondary | ICD-10-CM | POA: Diagnosis present

## 2017-11-06 DIAGNOSIS — K922 Gastrointestinal hemorrhage, unspecified: Secondary | ICD-10-CM | POA: Diagnosis present

## 2017-11-06 DIAGNOSIS — G939 Disorder of brain, unspecified: Secondary | ICD-10-CM | POA: Diagnosis present

## 2017-11-06 DIAGNOSIS — Z7189 Other specified counseling: Secondary | ICD-10-CM | POA: Diagnosis not present

## 2017-11-06 DIAGNOSIS — R403 Persistent vegetative state: Secondary | ICD-10-CM | POA: Diagnosis present

## 2017-11-06 DIAGNOSIS — I69319 Unspecified symptoms and signs involving cognitive functions following cerebral infarction: Secondary | ICD-10-CM | POA: Diagnosis not present

## 2017-11-06 DIAGNOSIS — X58XXXA Exposure to other specified factors, initial encounter: Secondary | ICD-10-CM | POA: Diagnosis present

## 2017-11-06 DIAGNOSIS — G40909 Epilepsy, unspecified, not intractable, without status epilepticus: Secondary | ICD-10-CM | POA: Diagnosis present

## 2017-11-06 DIAGNOSIS — J181 Lobar pneumonia, unspecified organism: Secondary | ICD-10-CM | POA: Diagnosis present

## 2017-11-06 DIAGNOSIS — Z515 Encounter for palliative care: Secondary | ICD-10-CM | POA: Diagnosis not present

## 2017-11-06 DIAGNOSIS — Y95 Nosocomial condition: Secondary | ICD-10-CM | POA: Diagnosis present

## 2017-11-06 DIAGNOSIS — E878 Other disorders of electrolyte and fluid balance, not elsewhere classified: Secondary | ICD-10-CM | POA: Diagnosis present

## 2017-11-06 DIAGNOSIS — E872 Acidosis: Secondary | ICD-10-CM | POA: Diagnosis present

## 2017-11-06 DIAGNOSIS — I5032 Chronic diastolic (congestive) heart failure: Secondary | ICD-10-CM | POA: Diagnosis present

## 2017-11-06 DIAGNOSIS — Z930 Tracheostomy status: Secondary | ICD-10-CM | POA: Diagnosis not present

## 2017-11-06 DIAGNOSIS — D62 Acute posthemorrhagic anemia: Secondary | ICD-10-CM | POA: Diagnosis present

## 2017-11-06 DIAGNOSIS — Z931 Gastrostomy status: Secondary | ICD-10-CM | POA: Diagnosis not present

## 2017-11-06 DIAGNOSIS — L89153 Pressure ulcer of sacral region, stage 3: Secondary | ICD-10-CM | POA: Diagnosis present

## 2017-11-06 DIAGNOSIS — E870 Hyperosmolality and hypernatremia: Secondary | ICD-10-CM | POA: Diagnosis present

## 2017-11-06 DIAGNOSIS — N183 Chronic kidney disease, stage 3 (moderate): Secondary | ICD-10-CM

## 2017-11-06 LAB — BASIC METABOLIC PANEL
ANION GAP: 10 (ref 5–15)
Anion gap: 13 (ref 5–15)
Anion gap: 14 (ref 5–15)
BUN: 298 mg/dL — AB (ref 6–20)
BUN: 234 mg/dL — ABNORMAL HIGH (ref 6–20)
CALCIUM: 7.1 mg/dL — AB (ref 8.9–10.3)
CALCIUM: 9 mg/dL (ref 8.9–10.3)
CALCIUM: 9.1 mg/dL (ref 8.9–10.3)
CO2: 14 mmol/L — ABNORMAL LOW (ref 22–32)
CO2: 20 mmol/L — AB (ref 22–32)
CO2: 21 mmol/L — AB (ref 22–32)
CREATININE: 2.35 mg/dL — AB (ref 0.44–1.00)
CREATININE: 2.48 mg/dL — AB (ref 0.44–1.00)
Chloride: 110 mmol/L (ref 101–111)
Chloride: 112 mmol/L — ABNORMAL HIGH (ref 101–111)
Chloride: 122 mmol/L — ABNORMAL HIGH (ref 101–111)
Creatinine, Ser: 1.66 mg/dL — ABNORMAL HIGH (ref 0.44–1.00)
GFR calc Af Amer: 25 mL/min — ABNORMAL LOW (ref >60)
GFR calc Af Amer: 38 mL/min — ABNORMAL LOW (ref >60)
GFR calc non Af Amer: 20 mL/min — ABNORMAL LOW (ref >60)
GFR, EST AFRICAN AMERICAN: 23 mL/min — AB (ref >60)
GFR, EST NON AFRICAN AMERICAN: 21 mL/min — AB (ref >60)
GFR, EST NON AFRICAN AMERICAN: 33 mL/min — AB (ref >60)
GLUCOSE: 196 mg/dL — AB (ref 65–99)
GLUCOSE: 222 mg/dL — AB (ref 65–99)
Glucose, Bld: 214 mg/dL — ABNORMAL HIGH (ref 65–99)
Potassium: 3.3 mmol/L — ABNORMAL LOW (ref 3.5–5.1)
Potassium: 4.5 mmol/L (ref 3.5–5.1)
Potassium: 4.6 mmol/L (ref 3.5–5.1)
SODIUM: 144 mmol/L (ref 135–145)
Sodium: 146 mmol/L — ABNORMAL HIGH (ref 135–145)
Sodium: 146 mmol/L — ABNORMAL HIGH (ref 135–145)

## 2017-11-06 LAB — CBC
HCT: 23.7 % — ABNORMAL LOW (ref 36.0–46.0)
Hemoglobin: 7.6 g/dL — ABNORMAL LOW (ref 12.0–15.0)
MCH: 30 pg (ref 26.0–34.0)
MCHC: 32.1 g/dL (ref 30.0–36.0)
MCV: 93.7 fL (ref 78.0–100.0)
PLATELETS: 172 10*3/uL (ref 150–400)
RBC: 2.53 MIL/uL — ABNORMAL LOW (ref 3.87–5.11)
RDW: 17.9 % — AB (ref 11.5–15.5)
WBC: 14.4 10*3/uL — AB (ref 4.0–10.5)

## 2017-11-06 LAB — GLUCOSE, CAPILLARY: Glucose-Capillary: 277 mg/dL — ABNORMAL HIGH (ref 65–99)

## 2017-11-06 LAB — STREP PNEUMONIAE URINARY ANTIGEN: STREP PNEUMO URINARY ANTIGEN: NEGATIVE

## 2017-11-06 LAB — CBG MONITORING, ED
GLUCOSE-CAPILLARY: 243 mg/dL — AB (ref 65–99)
GLUCOSE-CAPILLARY: 223 mg/dL — AB (ref 65–99)
Glucose-Capillary: 257 mg/dL — ABNORMAL HIGH (ref 65–99)
Glucose-Capillary: 312 mg/dL — ABNORMAL HIGH (ref 65–99)

## 2017-11-06 LAB — PREPARE RBC (CROSSMATCH)

## 2017-11-06 LAB — HIV ANTIBODY (ROUTINE TESTING W REFLEX): HIV Screen 4th Generation wRfx: NONREACTIVE

## 2017-11-06 MED ORDER — TOPIRAMATE 100 MG PO TABS
200.0000 mg | ORAL_TABLET | Freq: Two times a day (BID) | ORAL | Status: DC
  Administered 2017-11-06 – 2017-11-16 (×21): 200 mg
  Filled 2017-11-06 (×21): qty 2

## 2017-11-06 MED ORDER — FOLIC ACID 1 MG PO TABS
1.0000 mg | ORAL_TABLET | Freq: Every day | ORAL | Status: DC
  Administered 2017-11-06 – 2017-11-16 (×11): 1 mg
  Filled 2017-11-06 (×11): qty 1

## 2017-11-06 MED ORDER — ACETAMINOPHEN 325 MG PO TABS
650.0000 mg | ORAL_TABLET | Freq: Four times a day (QID) | ORAL | Status: DC | PRN
  Administered 2017-11-13 – 2017-11-14 (×2): 650 mg via ORAL
  Filled 2017-11-06 (×2): qty 2

## 2017-11-06 MED ORDER — LEVETIRACETAM 100 MG/ML PO SOLN
1000.0000 mg | Freq: Two times a day (BID) | ORAL | Status: DC
  Administered 2017-11-06 – 2017-11-16 (×21): 1000 mg
  Filled 2017-11-06 (×22): qty 10

## 2017-11-06 MED ORDER — TOPIRAMATE 100 MG PO TABS
200.0000 mg | ORAL_TABLET | Freq: Two times a day (BID) | ORAL | Status: DC
  Administered 2017-11-06: 200 mg via ORAL
  Filled 2017-11-06: qty 2

## 2017-11-06 MED ORDER — LEVETIRACETAM 100 MG/ML PO SOLN
1000.0000 mg | Freq: Two times a day (BID) | ORAL | Status: DC
  Filled 2017-11-06 (×2): qty 10

## 2017-11-06 MED ORDER — SODIUM CHLORIDE 0.9 % IV SOLN
80.0000 mg | Freq: Once | INTRAVENOUS | Status: AC
  Administered 2017-11-06: 80 mg via INTRAVENOUS
  Filled 2017-11-06: qty 80

## 2017-11-06 MED ORDER — SCOPOLAMINE 1 MG/3DAYS TD PT72
1.0000 | MEDICATED_PATCH | TRANSDERMAL | Status: DC
  Administered 2017-11-06 – 2017-11-15 (×4): 1.5 mg via TRANSDERMAL
  Filled 2017-11-06 (×4): qty 1

## 2017-11-06 MED ORDER — PANTOPRAZOLE SODIUM 40 MG PO PACK: 40.0000 mg | PACK | Freq: Every day | ORAL | Status: DC

## 2017-11-06 MED ORDER — ALBUTEROL SULFATE (2.5 MG/3ML) 0.083% IN NEBU: 2.5000 mg | INHALATION_SOLUTION | RESPIRATORY_TRACT | Status: DC | PRN

## 2017-11-06 MED ORDER — LACOSAMIDE 50 MG PO TABS
200.0000 mg | ORAL_TABLET | Freq: Two times a day (BID) | ORAL | Status: DC
  Administered 2017-11-06 – 2017-11-16 (×21): 200 mg
  Filled 2017-11-06 (×21): qty 4

## 2017-11-06 MED ORDER — ATORVASTATIN CALCIUM 40 MG PO TABS
40.0000 mg | ORAL_TABLET | Freq: Every day | ORAL | Status: DC
  Administered 2017-11-06 – 2017-11-15 (×10): 40 mg
  Filled 2017-11-06: qty 4
  Filled 2017-11-06: qty 2
  Filled 2017-11-06 (×6): qty 1
  Filled 2017-11-06: qty 2
  Filled 2017-11-06: qty 1
  Filled 2017-11-06: qty 2
  Filled 2017-11-06: qty 1

## 2017-11-06 MED ORDER — INSULIN ASPART 100 UNIT/ML ~~LOC~~ SOLN
0.0000 [IU] | SUBCUTANEOUS | Status: DC
  Administered 2017-11-06: 3 [IU] via SUBCUTANEOUS
  Administered 2017-11-06: 5 [IU] via SUBCUTANEOUS
  Administered 2017-11-06: 7 [IU] via SUBCUTANEOUS
  Administered 2017-11-06: 3 [IU] via SUBCUTANEOUS
  Administered 2017-11-07: 9 [IU] via SUBCUTANEOUS
  Administered 2017-11-07: 7 [IU] via SUBCUTANEOUS
  Administered 2017-11-07: 3 [IU] via SUBCUTANEOUS
  Administered 2017-11-07: 5 [IU] via SUBCUTANEOUS
  Administered 2017-11-07: 7 [IU] via SUBCUTANEOUS
  Administered 2017-11-07: 5 [IU] via SUBCUTANEOUS
  Administered 2017-11-08: 7 [IU] via SUBCUTANEOUS
  Administered 2017-11-08 (×2): 9 [IU] via SUBCUTANEOUS
  Filled 2017-11-06 (×4): qty 1

## 2017-11-06 MED ORDER — PANTOPRAZOLE SODIUM 40 MG IV SOLR
8.0000 mg/h | INTRAVENOUS | Status: AC
  Administered 2017-11-06 – 2017-11-08 (×6): 8 mg/h via INTRAVENOUS
  Filled 2017-11-06: qty 80
  Filled 2017-11-06: qty 40
  Filled 2017-11-06 (×2): qty 80
  Filled 2017-11-06: qty 40
  Filled 2017-11-06 (×7): qty 80

## 2017-11-06 MED ORDER — CLONIDINE HCL 0.1 MG PO TABS
0.2000 mg | ORAL_TABLET | Freq: Three times a day (TID) | ORAL | Status: DC
  Administered 2017-11-06 – 2017-11-16 (×30): 0.2 mg
  Filled 2017-11-06 (×30): qty 2

## 2017-11-06 MED ORDER — SODIUM CHLORIDE 0.9 % IV SOLN
1.0000 g | INTRAVENOUS | Status: DC
  Administered 2017-11-06: 1 g via INTRAVENOUS
  Filled 2017-11-06 (×2): qty 1

## 2017-11-06 MED ORDER — DOCUSATE SODIUM 50 MG/5ML PO LIQD
50.0000 mg | Freq: Two times a day (BID) | ORAL | Status: DC | PRN
  Filled 2017-11-06: qty 10

## 2017-11-06 MED ORDER — ADULT MULTIVITAMIN LIQUID CH
15.0000 mL | Freq: Every day | ORAL | Status: DC
  Administered 2017-11-06 – 2017-11-16 (×11): 15 mL
  Filled 2017-11-06 (×11): qty 15

## 2017-11-06 MED ORDER — ONDANSETRON HCL 4 MG/2ML IJ SOLN: 4.0000 mg | Freq: Four times a day (QID) | INTRAMUSCULAR | Status: DC | PRN

## 2017-11-06 MED ORDER — VITAMIN B-1 100 MG PO TABS
100.0000 mg | ORAL_TABLET | Freq: Every day | ORAL | Status: DC
  Administered 2017-11-06 – 2017-11-16 (×11): 100 mg
  Filled 2017-11-06 (×11): qty 1

## 2017-11-06 MED ORDER — SODIUM CHLORIDE 0.45 % IV SOLN
INTRAVENOUS | Status: DC
  Administered 2017-11-06 (×3): via INTRAVENOUS

## 2017-11-06 MED ORDER — ACETAMINOPHEN 650 MG RE SUPP: 650.0000 mg | Freq: Four times a day (QID) | RECTAL | Status: DC | PRN

## 2017-11-06 MED ORDER — SODIUM CHLORIDE 0.9 % IV BOLUS
1000.0000 mL | Freq: Once | INTRAVENOUS | Status: AC
  Administered 2017-11-06: 1000 mL via INTRAVENOUS

## 2017-11-06 MED ORDER — PANTOPRAZOLE SODIUM 40 MG IV SOLR
40.0000 mg | Freq: Two times a day (BID) | INTRAVENOUS | Status: DC
  Administered 2017-11-09 – 2017-11-14 (×12): 40 mg via INTRAVENOUS
  Filled 2017-11-06 (×13): qty 40

## 2017-11-06 MED ORDER — PHENYTOIN 125 MG/5ML PO SUSP
250.0000 mg | Freq: Two times a day (BID) | ORAL | Status: DC
  Administered 2017-11-06 (×2): 250 mg
  Filled 2017-11-06 (×3): qty 10

## 2017-11-06 MED ORDER — COLLAGENASE 250 UNIT/GM EX OINT
TOPICAL_OINTMENT | Freq: Every day | CUTANEOUS | Status: DC
  Administered 2017-11-07 – 2017-11-10 (×4): via TOPICAL
  Administered 2017-11-11: 1 via TOPICAL
  Administered 2017-11-12 – 2017-11-16 (×5): via TOPICAL
  Filled 2017-11-06: qty 90

## 2017-11-06 MED ORDER — AMLODIPINE BESYLATE 10 MG PO TABS
10.0000 mg | ORAL_TABLET | Freq: Every day | ORAL | Status: DC
  Administered 2017-11-06 – 2017-11-16 (×11): 10 mg
  Filled 2017-11-06 (×3): qty 1
  Filled 2017-11-06: qty 2
  Filled 2017-11-06 (×8): qty 1

## 2017-11-06 MED ORDER — LABETALOL HCL 200 MG PO TABS
400.0000 mg | ORAL_TABLET | Freq: Three times a day (TID) | ORAL | Status: DC
  Administered 2017-11-06 – 2017-11-11 (×15): 400 mg
  Administered 2017-11-11: 200 mg
  Administered 2017-11-11 – 2017-11-16 (×14): 400 mg
  Filled 2017-11-06 (×33): qty 2

## 2017-11-06 MED ORDER — ACETAMINOPHEN 160 MG/5ML PO SOLN
325.0000 mg | Freq: Four times a day (QID) | ORAL | Status: DC | PRN
  Administered 2017-11-11 – 2017-11-14 (×2): 325 mg
  Filled 2017-11-06 (×2): qty 20.3

## 2017-11-06 MED ORDER — HYDRALAZINE HCL 50 MG PO TABS
100.0000 mg | ORAL_TABLET | Freq: Three times a day (TID) | ORAL | Status: DC
  Administered 2017-11-06 – 2017-11-16 (×29): 100 mg
  Filled 2017-11-06 (×33): qty 2

## 2017-11-06 MED ORDER — DEXTROSE 5 % IV SOLN
10.0000 mg | Freq: Once | INTRAVENOUS | Status: AC
  Administered 2017-11-06: 10 mg via INTRAVENOUS
  Filled 2017-11-06: qty 1

## 2017-11-06 MED ORDER — SODIUM CHLORIDE 0.9 % IV SOLN
8.0000 mg/h | INTRAVENOUS | Status: DC
  Filled 2017-11-06: qty 80

## 2017-11-06 MED ORDER — ONDANSETRON HCL 4 MG PO TABS
4.0000 mg | ORAL_TABLET | Freq: Four times a day (QID) | ORAL | Status: DC | PRN
Start: 2017-11-06 — End: 2017-11-16

## 2017-11-06 MED ORDER — SODIUM CHLORIDE 0.9 % IV SOLN
Freq: Once | INTRAVENOUS | Status: AC
  Administered 2017-11-06: 10:00:00 via INTRAVENOUS

## 2017-11-06 MED ORDER — PHENYTOIN 125 MG/5ML PO SUSP
250.0000 mg | Freq: Two times a day (BID) | ORAL | Status: DC
  Administered 2017-11-07 – 2017-11-15 (×18): 250 mg via ORAL
  Filled 2017-11-06: qty 12
  Filled 2017-11-06 (×2): qty 10
  Filled 2017-11-06: qty 12
  Filled 2017-11-06: qty 10
  Filled 2017-11-06 (×6): qty 12
  Filled 2017-11-06: qty 10
  Filled 2017-11-06 (×7): qty 12

## 2017-11-06 MED ORDER — PHENYTOIN 125 MG/5ML PO SUSP: 250.0000 mg | Freq: Two times a day (BID) | ORAL | Status: DC

## 2017-11-06 MED ORDER — LEVETIRACETAM 100 MG/ML PO SOLN: 1000.0000 mg | Freq: Two times a day (BID) | ORAL | Status: DC

## 2017-11-06 MED ORDER — VANCOMYCIN HCL IN DEXTROSE 1-5 GM/200ML-% IV SOLN: 1000.0000 mg | INTRAVENOUS | Status: DC

## 2017-11-06 MED ORDER — SODIUM CHLORIDE 0.9 % IV SOLN
10.0000 mL/h | Freq: Once | INTRAVENOUS | Status: AC
Start: 2017-11-06 — End: 2017-11-06
  Administered 2017-11-06: 10 mL/h via INTRAVENOUS

## 2017-11-06 NOTE — Progress Notes (Addendum)
CSW met with pt to complete an assessment and pt was not responsive.  CSW called pt's daughter Royston Bake for collateral information at ph: [phone] and left a HIPPA-compliant VM.  CSW called pt's Significant other Ok Edwards at ph: [phone] and was told that this was the wrong number.    CSW called the pt's "friend" in her emergency contacts at ph: [phone] and this number was no longer operable.  CSW called pt's phone number at ph: [phone] and a person answered the phone who was not familiar with any person or facility associated with the pt's emergency contact list.  CSW ascertained from notes pt D/C'd to [facility] at last D/C and called and spoke to a staff member at [facility] who stated pt was a resident there. CSW reviewed chart and saw no indication of a legal guardian in the notes or the FYI flags.  CSW received a call from pt's daughter Royston Bake for collateral information at ph: [phone] who stated pt was initially to go to [facility] for short-term but pt has been hospitalized twice and pt's daughter would like for the pt to her home in [place], [place] at some point and would like to be able to care for the pt herself once the pt is able to D/C from the SNF/Hospital.  Otila Kluver at ph: [phone] at [facility] would like to be updated as well once pt is on the medical floor.  Please reconsult if future social work needs arise.  CSW signing off, as social work intervention is no longer needed.  Alphonse Guild. Rigby Leonhardt, LCSW, LCAS, CSI Clinical Social Worker Ph: [phone]

## 2017-11-06 NOTE — Progress Notes (Signed)
Pharmacy Antibiotic Note  [name] is a 62 y.o. female admitted on 11/05/2017 with pneumonia.  Pharmacy has been consulted for cefepime and vancomycin dosing.  Plan: Cefepime 2 Gm x1 then 1 Gm IV q24h Vancomycin 2 Gm x1 then 1 Gm IV q48h for est AUC = 476 Goal AUC = 400-500 F/u scr/cultures  Height: 5\' 6"  (167.6 cm) Weight: 177 lb (80.3 kg) IBW/kg (Calculated) : 59.3  Temp (24hrs), Avg:98.2 F (36.8 C), Min:97.6 F (36.4 C), Max:98.5 F (36.9 C)  Recent Labs  Lab 11/05/17 2200 11/05/17 2208 11/06/17 0033  WBC 18.6*  --   --   CREATININE 2.48*  --  2.35*  LATICACIDVEN  --  0.94  --     Estimated Creatinine Clearance: 27.2 mL/min (A) (by C-G formula based on SCr of 2.35 mg/dL (H)).    No Known Allergies  Antimicrobials this admission: 4/29 cefepime >>  4/29 vancomycin >>   Dose adjustments this admission:   Microbiology results:  BCx:   UCx:    Sputum:    MRSA PCR:   Thank you for allowing pharmacy to be a part of this patient's care.  Dorrene German 11/06/2017 5:11 AM

## 2017-11-06 NOTE — ED Notes (Signed)
Charge RN has called ICU Charge RN to find out when patient will have bed. Pt will have bed closer to shift change around 1900.

## 2017-11-06 NOTE — ED Notes (Signed)
Spoke with Pharmacy to move Medications due times due to medications being given around 1300.

## 2017-11-06 NOTE — Consult Note (Addendum)
[place] Nurse wound consult note Reason for Consult: Consult requested for sacrum wound.  Pt is familiar to [facility] team from a recent admission on 4/1. Pt is frequently incontinent of urine and skin is constantly moist. Wound type: Sacrum with unstageable pressure injury; 2X1cm, 100% tightly adhered yellow slough.  Surrounding area is red macerated full thickness skin loss; appearance is consistent with moisture associated skin damage.  Affected area is approx 10X6X.2cm.  Mod amt tan drainage from bilat buttocks and sacrum wounds.  Right posterior thigh with red macerated full thickness skin loss; appearance is consistent with moisture associated skin damage. Shaggy irregular edges, affected area is approx 5X9X.2cm.  Mod amt tan drainage. Pressure Injury POA: Yes Dressing procedure/placement/frequency: Foam dressing to right posterior thigh to protect from moisture and absorb drainage.  Santyl for enzymatic debridement of nonviable tissue to sacrum and foam dressing to surrounding skin on buttocks.  No family present to discuss plan of care and patient is not verbally responsive. Please re-consult if further assistance is needed.  Thank-you,  Julien Girt MSN, [place], [place], [place], [place]

## 2017-11-06 NOTE — Clinical Social Work Note (Addendum)
Clinical Social Work Assessment  Patient Details  Name: [name] MRN: [id number] Date of Birth: [birth year]  Date of referral:  11/06/17               Reason for consult:  Facility Placement                Permission sought to share information with:  Facility Art therapist granted to share information::  Yes, Verbal Permission Granted  Name::        Agency::     Relationship::     Contact Information:     Housing/Transportation Living arrangements for the past 2 months:  Skilled Nursing Facility(1 mo) Source of Information:  Adult Children Patient Interpreter Needed:  None Criminal Activity/Legal Involvement Pertinent to Current Situation/Hospitalization:    Significant Relationships:  Adult Children Lives with:  Facility Resident Do you feel safe going back to the place where you live?  Yes Need for family participation in patient care:  Yes (Comment)  Care giving concerns:  CSW attempted to meet with pt to complete an assessment and pt was not responsive.    CSW called pt's daughter Royston Bake for collateral information at ph: [phone] and left a HIPPA-compliant VM.  CSW called pt's Significant other Ok Edwards at ph: [phone] and was told that this was the wrong number.    CSW called the pt's "friend" in her emergency contacts at ph: [phone] and this number was no longer operable.  CSW called pt's phone number at ph: [phone] and a person answered the phone who was not familiar with any person or facility associated with the pt's emergency contact list.  CSW ascertained from notes pt D/C'd to [facility] at last D/C and called and spoke to a staff member at [facility] who stated pt was a resident there. CSW reviewed chart and saw no indication of a legal guardian in the notes or the FYI flags.  CSW received a call from pt's daughter Royston Bake for collateral information at ph: [phone] who stated pt was initially  to go to [facility] for short-term but pt has been hospitalized twice and pt's daughter would like for the pt to her home in [place], [place] at some point and would like to be able to care for the pt herself once the pt is able to D/C from the SNF/Hospital.  Otila Kluver at ph: [phone] at [facility] would like to be updated as well once pt is on the medical floor.   Social Worker assessment / plan:  CSW met with pt and spoke to pt's daughter and confirmed pt's/pt's daughter/ plan to be discharged to SNF to live at discharge if appropriate at D/C.  CSW provided active listening and validated pt's daughter concerns.   CSW DEPT was given permission to complete FL-2 and send referrals out to [facility] facility via the hub per pt's daughter's request IF appropriate at D/C.  Pt has been living at [facility], prior to being admitted to [facility]  Employment status:    Insurance information:  [place]icaid [place] PT Recommendations:  Not assessed at this time Information / Referral to community resources:     Patient/Family's Response to care:  Patient not alert and oriented.  Patient's daughter agreeable to plan.  Pt's daughter supportive and strongly involved in pt.'s care.  Pt.'s daughter pleasant and appreciated CSW intervention.     Patient/Family's Understanding of and Emotional Response to Diagnosis, Current Treatment, and Prognosis:  Still assessing  Emotional Assessment Appearance:  Appears stated age Attitude/Demeanor/Rapport:  Unable to Assess Affect (typically observed):    Orientation:  Fluctuating Orientation (Suspected and/or reported Sundowners) Alcohol / Substance use:    Psych involvement (Current and /or in the community):     Discharge Needs  Concerns to be addressed:  No discharge needs identified Readmission within the last 30 days:  Yes Current discharge risk:  None Barriers to Discharge:  No Barriers Identified   Claudine Mouton, LCSWA 11/06/2017, 6:10 PM

## 2017-11-06 NOTE — Consult Note (Signed)
Subjective:   HPI  The patient is a 62 year old female who we are asked to see in consultation in regards to anemia and heme positive stool. The patient is non communicative and not responsive.her history is obtained from reviewing her chart. She has a history of significant tobacco or alcohol and cocaine use. She has stage III chronic kidney disease. Hyperlipidemia, hypertension, uncontrolled diabetes, history of right upper extremity DVT, and a history of hepatitis. 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 She has had a left pontine intracranial hemorrhage. She has had an acute thalami Hemorrhage. Multiple seizures. 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 She was recently in the hospital with acute blood loss anemia and hemorrhagic shock and found to have a abdominal wall hematoma. She had been on Coumadin which was held. After returning to the SNF serial hemoglobins were followed but because of a drop again she was brought back to the emergency room. There have been no signs of active GI bleeding reported.  Review of Systems unobtainable  Past Medical History:  Diagnosis Date  . Acute deep vein thrombosis (DVT) of right upper extremity ([place]) 08/30/2017  . Chronic diastolic CHF (congestive heart failure) ([place]) 08/30/2017  . Cocaine abuse, continuous ([place])   . Diabetes mellitus    Hb A1C = 12.6 on 05/15/11, managed on Novolog 70/30, 35 U qam, 25 U qpm  . Hypertension    poorly controlled  . Insomnia disorder   . Shortness of breath   . Stroke [facility])    Past Surgical History:  Procedure Laterality Date  . ANKLE FRACTURE SURGERY  2007  . IR GASTROSTOMY TUBE MOD SED  08/17/2017   Social History   Socioeconomic History  . Marital status: Single    Spouse name: Not on file  . Number of children: Not on file  . Years of education: Not on file  . Highest education level: Not on file  Occupational History  . Not on  file  Social Needs  . Financial resource strain: Not on file  . Food insecurity:    Worry: Not on file    Inability: Not on file  . Transportation needs:    Medical: Not on file    Non-medical: Not on file  Tobacco Use  . Smoking status: Current Every Day Smoker    Packs/day: 1.50    Years: 25.00    Pack years: 37.50    Types: Cigarettes  . Smokeless tobacco: Never Used  Substance and Sexual Activity  . Alcohol use: Yes    Alcohol/week: 4.8 oz    Types: 2 Glasses of wine, 6 Cans of beer per week    Comment: pint of liquor a week  . Drug use: Yes    Types: Cocaine    Comment: 07/2015 last use   . Sexual activity: Yes    Birth control/protection: Post-menopausal  Lifestyle  . Physical activity:    Days per week: Not on file    Minutes per session: Not on file  . Stress: Not on file  Relationships  . Social connections:    Talks on phone: Not on file    Gets together: Not on file    Attends religious service: Not on file    Active member of club or organization: Not on file    Attends meetings of clubs or organizations: Not on file    Relationship status: Not on file  . Intimate partner  violence:    Fear of current or ex partner: Not on file    Emotionally abused: Not on file    Physically abused: Not on file    Forced sexual activity: Not on file  Other Topics Concern  . Not on file  Social History Narrative  . Not on file   family history includes Diabetes in her brother and sister; Stroke in her father.  Current Facility-Administered Medications:  .  0.45 % sodium chloride infusion, , Intravenous, Continuous, Alcario Drought, Jared M, DO, Last Rate: 100 mL/hr at 11/06/17 1311 .  acetaminophen (TYLENOL) solution 325 mg, 325 mg, Per Tube, Q6H PRN, Etta Quill, DO .  acetaminophen (TYLENOL) tablet 650 mg, 650 mg, Oral, Q6H PRN **OR** acetaminophen (TYLENOL) suppository 650 mg, 650 mg, Rectal, Q6H PRN, Etta Quill, DO .  albuterol (PROVENTIL) (2.5 MG/3ML) 0.083%  nebulizer solution 2.5 mg, 2.5 mg, Nebulization, Q2H PRN, Alcario Drought, Jared M, DO .  amLODipine (NORVASC) tablet 10 mg, 10 mg, Per Tube, Daily, Jennette Kettle M, DO, 10 mg at 11/06/17 1314 .  atorvastatin (LIPITOR) tablet 40 mg, 40 mg, Per Tube, q1800, Etta Quill, DO .  ceFEPIme (MAXIPIME) 1 g in sodium chloride 0.9 % 100 mL IVPB, 1 g, Intravenous, Q24H, Dorrene German, RPH .  cloNIDine (CATAPRES) tablet 0.2 mg, 0.2 mg, Per Tube, TID, Etta Quill, DO, 0.2 mg at 11/06/17 1314 .  docusate (COLACE) 50 MG/5ML liquid 50 mg, 50 mg, Per Tube, BID PRN, Etta Quill, DO .  folic acid (FOLVITE) tablet 1 mg, 1 mg, Per Tube, Daily, Jennette Kettle M, DO, 1 mg at 11/06/17 1314 .  hydrALAZINE (APRESOLINE) tablet 100 mg, 100 mg, Per Tube, Q8H, Etta Quill, DO, 100 mg at 11/06/17 0657 .  insulin aspart (novoLOG) injection 0-9 Units, 0-9 Units, Subcutaneous, Q4H, Alcario Drought, Jared M, DO, 3 Units at 11/06/17 1312 .  labetalol (NORMODYNE) tablet 400 mg, 400 mg, Per Tube, TID, Etta Quill, DO, 400 mg at 11/06/17 1313 .  lacosamide (VIMPAT) tablet 200 mg, 200 mg, Per Tube, BID, Jennette Kettle M, DO, 200 mg at 11/06/17 1313 .  levETIRAcetam (KEPPRA) 100 MG/ML solution 1,000 mg, 1,000 mg, Per Tube, BID, Duffy Bruce, MD, 1,000 mg at 11/06/17 1325 .  multivitamin liquid 15 mL, 15 mL, Per Tube, Daily, Jennette Kettle M, DO, 15 mL at 11/06/17 1324 .  ondansetron (ZOFRAN) tablet 4 mg, 4 mg, Oral, Q6H PRN **OR** ondansetron (ZOFRAN) injection 4 mg, 4 mg, Intravenous, Q6H PRN, Alcario Drought, Jared M, DO .  pantoprazole (PROTONIX) 80 mg in sodium chloride 0.9 % 250 mL (0.32 mg/mL) infusion, 8 mg/hr, Intravenous, Continuous, Duffy Bruce, MD, Last Rate: 25 mL/hr at 11/06/17 1311, 8 mg/hr at 11/06/17 1311 .  [START ON 11/09/2017] pantoprazole (PROTONIX) injection 40 mg, 40 mg, Intravenous, Q12H, Duffy Bruce, MD .  phenytoin (DILANTIN) 125 MG/5ML suspension 250 mg, 250 mg, Per Tube, BID, Duffy Bruce, MD,  250 mg at 11/06/17 1314 .  scopolamine (TRANSDERM-SCOP) 1 MG/3DAYS 1.5 mg, 1 patch, Transdermal, Q72H, Etta Quill, DO, 1.5 mg at 11/06/17 3235 .  thiamine (VITAMIN B-1) tablet 100 mg, 100 mg, Per Tube, Daily, Jennette Kettle M, DO, 100 mg at 11/06/17 1314 .  topiramate (TOPAMAX) tablet 200 mg, 200 mg, Per Tube, BID, Jennette Kettle M, DO, 200 mg at 11/06/17 1313 .  [START ON 11/08/2017] vancomycin (VANCOCIN) IVPB 1000 mg/200 mL premix, 1,000 mg, Intravenous, Q48H, Dorrene German, [facility]  Current Outpatient Medications:  .  acetaminophen (TYLENOL) 160 MG/5ML solution, Place 10.2 mLs (325 mg total) into feeding tube every 6 (six) hours as needed for mild pain, headache or fever., Disp: 120 mL, Rfl: 0 .  albuterol (PROVENTIL) (2.5 MG/3ML) 0.083% nebulizer solution, Take 3 mLs (2.5 mg total) by nebulization every 2 (two) hours as needed for wheezing., Disp: 75 mL, Rfl: 0 .  Amino Acids-Protein Hydrolys (FEEDING SUPPLEMENT, PRO-STAT SUGAR FREE 64,) LIQD, Place 30 mLs into feeding tube 3 (three) times daily. (Patient taking differently: Place 30 mLs into feeding tube 2 (two) times daily. ), Disp: 900 mL, Rfl: 0 .  amLODipine (NORVASC) 10 MG tablet, Place 1 tablet (10 mg total) into feeding tube daily., Disp: 30 tablet, Rfl: 0 .  aspirin 81 MG chewable tablet, Place 1 tablet (81 mg total) into feeding tube daily., Disp: 30 tablet, Rfl: 0 .  atorvastatin (LIPITOR) 40 MG tablet, Place 1 tablet (40 mg total) into feeding tube daily at 6 PM., Disp: 30 tablet, Rfl: 0 .  cloNIDine (CATAPRES) 0.2 MG tablet, Place 1 tablet (0.2 mg total) into feeding tube 3 (three) times daily., Disp: 90 tablet, Rfl: 0 .  docusate (COLACE) 50 MG/5ML liquid, Place 5 mLs (50 mg total) into feeding tube 2 (two) times daily as needed for mild constipation., Disp: 100 mL, Rfl: 0 .  folic acid (FOLVITE) 1 MG tablet, Take 1 tablet (1 mg total) by mouth daily., Disp: 30 tablet, Rfl: 0 .  hydrALAZINE (APRESOLINE) 100 MG tablet, Place 1  tablet (100 mg total) into feeding tube every 8 (eight) hours., Disp: 90 tablet, Rfl: 0 .  insulin glargine (LANTUS) 100 UNIT/ML injection, Inject 0.4 mLs (40 Units total) into the skin 2 (two) times daily., Disp: , Rfl:  .  insulin lispro (HUMALOG) 100 UNIT/ML injection, Inject 2-10 Units into the skin 4 (four) times daily. Per sliding scale. 201-250=2 units, 251-300=4 units, 301-350=6 units, 351-400=8 units, 401-450=10 units > 450 Call MD., Disp: , Rfl:  .  labetalol (NORMODYNE) 200 MG tablet, Place 2 tablets (400 mg total) into feeding tube 3 (three) times daily., Disp: 180 tablet, Rfl: 0 .  lacosamide (VIMPAT) 200 MG TABS tablet, Place 1 tablet (200 mg total) into feeding tube 2 (two) times daily., Disp: 60 tablet, Rfl: 0 .  levETIRAcetam (KEPPRA) 100 MG/ML solution, Place 10 mLs (1,000 mg total) into feeding tube 2 (two) times daily., Disp: 473 mL, Rfl: 0 .  LORazepam (ATIVAN) 2 MG/ML injection, Inject 0.5 mLs (1 mg total) into the vein as needed for seizure (Give if seizure lasts longer than 5 minutes)., Disp: 1 mL, Rfl: 0 .  Multiple Vitamin (MULTIVITAMIN+) LIQD, Take 15 mLs by mouth daily., Disp: , Rfl:  .  nutrition supplement, JUVEN, (JUVEN) PACK, Place 1 packet into feeding tube 2 (two) times daily between meals., Disp: , Rfl:  .  pantoprazole sodium (PROTONIX) 40 mg/20 mL PACK, Place 20 mLs (40 mg total) into feeding tube at bedtime., Disp: 30 each, Rfl: 0 .  phenytoin (DILANTIN) 125 MG/5ML suspension, Place 250 mg into feeding tube 2 (two) times daily., Disp: , Rfl:  .  scopolamine (TRANSDERM-SCOP) 1 MG/3DAYS, Place 1 patch (1.5 mg total) onto the skin every 3 (three) days., Disp: 10 patch, Rfl: 0 .  thiamine 100 MG tablet, Place 1 tablet (100 mg total) into feeding tube daily., Disp: 30 tablet, Rfl: 0 .  topiramate (TOPAMAX) 200 MG tablet, Place 1 tablet (200 mg total) into feeding tube 2 (two) times daily., Disp: 60 tablet,  Rfl: 0 .  warfarin (COUMADIN) 4 MG tablet, Take 8 mg by mouth  daily at 6 PM. Via feeding tube, Disp: , Rfl:  No Known Allergies   Objective:     BP 138/80   Pulse 92   Temp 97.6 F (36.4 C) (Oral)   Resp (!) 24   Ht 5\' 6"  (1.676 m)   Wt 80.3 kg (177 lb)   SpO2 100%   BMI 28.57 kg/m   Unresponsive  Chronic tracheostomy, no signs of bleeding around this.  Heart regular rhythm  Lungs clear  Abdomen: PEG tube present no blood seen around it or in the tube. Abdomen soft without obvious tenderness  Laboratory No components found for: D1    Assessment:     This patient has multiple medical problems as stated above. She has a chronic PEG and tracheostomy. She is living in a vegetative state. There are no signs of  GI bleeding,but she does have heme positive stool.      Plan:     Avoid anticoagulants if possible. Transfuse blood if possible. This patient is in no shape to undergo elective endoscopic evaluation for heme positive stool and anemia. I would recommend only supportive care, transfusion as needed. Recommend palliative care consult. Avoid instrumentation unless needed for life-threatening emergency if at all possible. We will sign off. Call if needed.

## 2017-11-06 NOTE — H&P (Addendum)
History and Physical    [name] EXB:[id number] DOB: [birth year] DOA: 11/05/2017  PCP: Nolene Ebbs, MD  Patient coming from: SNF  I have personally briefly reviewed patient's old medical records in [place]  Chief Complaint: Anemia  HPI: [name] is a 62 y.o. female with medical history significant of tobacco alcohol and cocaine use, CKD stage III, hyperlipidemia, hypertension, uncontrolled diabetes, history of right upper extremity DVT, history of hepatitis C.  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  Patient was most recently admitted from 4/1 to 4/11 for acute blood loss anemia and hemorrhagic shock.  Initially believed to be due to bleeding from trach site but ultimately determined to be due to abdominal wall hematoma in the R lower abdomen.  Coumadin held initially, patient transfused, and coumadin resumed on discharge.  Today patient sent in to ED from SNF after serial hemoglobins (being performed since last admit) declined to 7.1 today.  Patient is unable to provide any history herself due to being in persistent vegetative state.  ED Course: HGB 7.1 confirmed.  1u PRBC transfusion ordered.  GI consulted and will see in AM.  Hemoccult is positive though patient has brown stool, no overt melena or hematochezia.  Started on PPI GTT.  Coumadin reversed with FFP and Vit K.  Initially no bleeding from trach site but did cough up a small (table spoon or two) amount of blood later in the AM.  Repeat CT of abd/pelvis shows resolving / smaller abd wall  hematoma.  No acute findings or bleed.  WBC 18.6k.  CXR shows RLL PNA.  Patient put on cefepime and vanc.  BUN 298 and creat 2.48.  Similar to presentation with last admission.  EDP called and spoke with Dr. Joelyn Oms who agrees with transfusion, IVF and says its okay to admit to [facility] right now, doesn't need to go to [facility] right now.  Review of Systems: Unable to perform due to PVS.  Past Medical History:  Diagnosis Date  . Acute deep vein thrombosis (DVT) of right upper extremity ([place]) 08/30/2017  . Chronic diastolic CHF (congestive heart failure) ([place]) 08/30/2017  . Cocaine abuse, continuous ([place])   . Diabetes mellitus    Hb A1C = 12.6 on 05/15/11, managed on Novolog 70/30, 35 U qam, 25 U qpm  . Hypertension    poorly controlled  . Insomnia disorder   . Shortness of breath   . Stroke [facility])     Past Surgical History:  Procedure Laterality Date  . ANKLE FRACTURE SURGERY  2007  . IR GASTROSTOMY TUBE MOD SED  08/17/2017     reports that she has been smoking cigarettes.  She has a 37.50 pack-year smoking history. She has never used smokeless tobacco. She reports that she drinks about 4.8 oz of alcohol per week. She reports that she has current or past drug history. Drug: Cocaine.  No Known Allergies  Family History  Problem Relation Age of Onset  . Stroke Father   . Diabetes Sister   . Diabetes Brother      Prior to Admission medications   Medication Sig Start Date End Date Taking? Authorizing Provider  acetaminophen (TYLENOL)  160 MG/5ML solution Place 10.2 mLs (325 mg total) into feeding tube every 6 (six) hours as needed for mild pain, headache or fever. 09/13/17  Yes Allie Bossier, MD  albuterol (PROVENTIL) (2.5 MG/3ML) 0.083% nebulizer solution Take 3 mLs (2.5 mg total) by nebulization every 2 (two) hours as needed for wheezing. 09/13/17  Yes Allie Bossier, MD  Amino Acids-Protein Hydrolys (FEEDING SUPPLEMENT, PRO-STAT SUGAR FREE 64,) LIQD Place 30 mLs into feeding tube 3 (three)  times daily. Patient taking differently: Place 30 mLs into feeding tube 2 (two) times daily.  09/13/17  Yes Allie Bossier, MD  amLODipine (NORVASC) 10 MG tablet Place 1 tablet (10 mg total) into feeding tube daily. 09/14/17  Yes Allie Bossier, MD  aspirin 81 MG chewable tablet Place 1 tablet (81 mg total) into feeding tube daily. 09/14/17  Yes Allie Bossier, MD  atorvastatin (LIPITOR) 40 MG tablet Place 1 tablet (40 mg total) into feeding tube daily at 6 PM. 09/13/17  Yes Allie Bossier, MD  cloNIDine (CATAPRES) 0.2 MG tablet Place 1 tablet (0.2 mg total) into feeding tube 3 (three) times daily. 09/13/17  Yes Allie Bossier, MD  docusate (COLACE) 50 MG/5ML liquid Place 5 mLs (50 mg total) into feeding tube 2 (two) times daily as needed for mild constipation. 09/13/17  Yes Allie Bossier, MD  folic acid (FOLVITE) 1 MG tablet Take 1 tablet (1 mg total) by mouth daily. 06/05/17  Yes Aline August, MD  hydrALAZINE (APRESOLINE) 100 MG tablet Place 1 tablet (100 mg total) into feeding tube every 8 (eight) hours. 09/13/17  Yes Allie Bossier, MD  insulin glargine (LANTUS) 100 UNIT/ML injection Inject 0.4 mLs (40 Units total) into the skin 2 (two) times daily. 10/18/17  Yes Ghimire, Henreitta Leber, MD  insulin lispro (HUMALOG) 100 UNIT/ML injection Inject 2-10 Units into the skin 4 (four) times daily. Per sliding scale. 201-250=2 units, 251-300=4 units, 301-350=6 units, 351-400=8 units, 401-450=10 units > 450 Call MD.   Yes [provider]  labetalol (NORMODYNE) 200 MG tablet Place 2 tablets (400 mg total) into feeding tube 3 (three) times daily. 09/13/17  Yes Allie Bossier, MD  lacosamide (VIMPAT) 200 MG TABS tablet Place 1 tablet (200 mg total) into feeding tube 2 (two) times daily. 09/13/17  Yes Allie Bossier, MD  levETIRAcetam (KEPPRA) 100 MG/ML solution Place 10 mLs (1,000 mg total) into feeding tube 2 (two) times daily. 09/13/17  Yes Allie Bossier, MD  LORazepam (ATIVAN) 2 MG/ML injection Inject 0.5 mLs  (1 mg total) into the vein as needed for seizure (Give if seizure lasts longer than 5 minutes). 10/18/17  Yes Ghimire, Henreitta Leber, MD  Multiple Vitamin (MULTIVITAMIN+) LIQD Take 15 mLs by mouth daily.   Yes [provider]  nutrition supplement, JUVEN, (JUVEN) PACK Place 1 packet into feeding tube 2 (two) times daily between meals.   Yes [provider]  pantoprazole sodium (PROTONIX) 40 mg/20 mL PACK Place 20 mLs (40 mg total) into feeding tube at bedtime. 09/13/17  Yes Allie Bossier, MD  phenytoin (DILANTIN) 125 MG/5ML suspension Place 250 mg into feeding tube 2 (two) times daily.   Yes [provider]  scopolamine (TRANSDERM-SCOP) 1 MG/3DAYS Place 1 patch (1.5 mg total) onto the skin every 3 (three) days. 09/14/17  Yes Allie Bossier, MD  thiamine 100 MG tablet Place 1 tablet (100 mg total) into feeding tube daily. 09/14/17  Yes Allie Bossier, MD  topiramate (TOPAMAX) 200 MG tablet Place 1 tablet (200 mg total) into feeding tube 2 (two) times daily. 09/13/17  Yes Allie Bossier, MD  warfarin (COUMADIN) 4 MG tablet Take 8 mg by mouth daily at 6 PM. Via feeding tube   Yes [provider]    Physical Exam: Vitals:   11/06/17 0230 11/06/17 0256 11/06/17 0300 11/06/17 0330  BP: 136/70 (!) 141/66 137/74 (!) 145/82  Pulse: 96 96 96 95  Resp: 11 (!) 25 (!) 24 15  Temp:      TempSrc:      SpO2: 100% 100% 100% 100%  Weight:      Height:        Constitutional: NAD, calm, comfortable Eyes: PERRL, lids and conjunctivae normal ENMT: Mucous membranes are moist. Posterior pharynx clear of any exudate or lesions.Normal dentition.  Neck: normal, supple, no masses, no thyromegaly Respiratory: clear to auscultation bilaterally, no wheezing, no crackles. Normal respiratory effort. No accessory muscle use.  Cardiovascular: Regular rate and rhythm, no murmurs / rubs / gallops. No extremity edema. 2+ pedal pulses. No carotid bruits.  Abdomen: no tenderness, no masses  palpated. No hepatosplenomegaly. Bowel sounds positive.  Musculoskeletal: no clubbing / cyanosis. No joint deformity upper and lower extremities. Good ROM, no contractures. Normal muscle tone.  Skin: no rashes, lesions, ulcers. No induration Neurologic: Preserved autonomic function, spontaneous eye opening, not making eye contact.  Cough reflex in tact (coughing up small amount of blood). Psychiatric: No evidence of awareness of self or environment, not really interactive.   Labs on Admission: I have personally reviewed following labs and imaging studies  CBC: Recent Labs  Lab 11/05/17 2200  WBC 18.6*  NEUTROABS 14.2*  HGB 7.1*  HCT 22.6*  MCV 95.0  PLT 623   Basic Metabolic Panel: Recent Labs  Lab 11/05/17 2200 11/06/17 0033  NA 144 146*  K 4.6 4.5  CL 110 112*  CO2 20* 21*  GLUCOSE 214* 196*  BUN 298* >300*  CREATININE 2.48* 2.35*  CALCIUM 9.0 9.1   GFR: Estimated Creatinine Clearance: 27.2 mL/min (A) (by C-G formula based on SCr of 2.35 mg/dL (H)). Liver Function Tests: No results for input(s): AST, ALT, ALKPHOS, BILITOT, PROT, ALBUMIN in the last 168 hours. No results for input(s): LIPASE, AMYLASE in the last 168 hours. No results for input(s): AMMONIA in the last 168 hours. Coagulation Profile: Recent Labs  Lab 11/05/17 2200  INR 2.44   Cardiac Enzymes: No results for input(s): CKTOTAL, CKMB, CKMBINDEX, TROPONINI in the last 168 hours. BNP (last 3 results) No results for input(s): PROBNP in the last 8760 hours. HbA1C: No results for input(s): HGBA1C in the last 72 hours. CBG: No results for input(s): GLUCAP in the last 168 hours. Lipid Profile: No results for input(s): CHOL, HDL, LDLCALC, TRIG, CHOLHDL, LDLDIRECT in the last 72 hours. Thyroid Function Tests: No results for input(s): TSH, T4TOTAL, FREET4, T3FREE, THYROIDAB in the last 72 hours. Anemia Panel: No results for input(s): VITAMINB12, FOLATE, FERRITIN, TIBC, IRON, RETICCTPCT in the last 72  hours. Urine analysis:    Component Value Date/Time   COLORURINE YELLOW 10/08/2017 [place] 10/08/2017 1143   LABSPEC 1.012 10/08/2017 1143   PHURINE 7.0 10/08/2017 1143   GLUCOSEU NEGATIVE 10/08/2017 1143   GLUCOSEU > 1000 mg/dL (A) 11/01/2007 2056   HGBUR MODERATE (A) 10/08/2017 1143   HGBUR negative 07/15/2007 0954   BILIRUBINUR NEGATIVE 10/08/2017 1143   KETONESUR NEGATIVE 10/08/2017 1143   PROTEINUR 30 (A)  10/08/2017 1143   UROBILINOGEN 1.0 04/09/2012 1524   NITRITE NEGATIVE 10/08/2017 1143   LEUKOCYTESUR LARGE (A) 10/08/2017 1143    Radiological Exams on Admission: Ct Abdomen Pelvis Wo Contrast  Result Date: 11/05/2017 CLINICAL DATA:  Anemia, abdominal distention EXAM: CT ABDOMEN AND PELVIS WITHOUT CONTRAST TECHNIQUE: Multidetector CT imaging of the abdomen and pelvis was performed following the standard protocol without IV contrast. COMPARISON:  10/08/2017 FINDINGS: Lower chest: Respiratory motion at the lung bases. Mild patchy right lower lobe opacities, favored to reflect a combination of atelectasis and mild pneumonia anteriorly (series 4/image 16). Hepatobiliary: Unenhanced liver is grossly unremarkable. Gallbladder is unremarkable. No intrahepatic extrahepatic ductal dilatation. Pancreas: Within normal limits. Spleen: Within normal limits. Adrenals/Urinary Tract: Adrenal glands are within normal limits. 3 mm nonobstructing right lower pole renal calculus (series 2/image 40). Left kidney is within normal limits. No hydronephrosis. Bladder is within normal limits. Stomach/Bowel: Percutaneous gastrostomy along the anterior gastric antrum. No evidence of bowel obstruction. Normal appendix (series 2/image 56). Left colonic diverticulosis, without evidence of diverticulitis. Vascular/Lymphatic: No evidence of abdominal aortic aneurysm. Atherosclerotic calcifications of the abdominal aorta and branch vessels. No suspicious abdominopelvic lymphadenopathy. Reproductive:  Uterus is within normal limits. No adnexal masses. Other: No abdominopelvic ascites. Musculoskeletal: 3.4 cm resolving hematoma/seroma overlying the right flank (series 2/image 64). Minimal subcutaneous stranding/resolving hematoma overlying the left lower abdomen. No evidence of acute retroperitoneal hematoma. Mild degenerative changes of the visualized thoracolumbar spine. IMPRESSION: No evidence of acute retroperitoneal hematoma. 3.4 cm resolving hematoma/seroma overlying the right flank, improved. Mild patchy right lower lobe opacities, favored to reflect a combination of atelectasis and right lower lobe pneumonia. Additional ancillary findings as above. Electronically Signed   By: Julian Hy M.D.   On: 11/05/2017 23:20   Dg Chest Portable 1 View  Result Date: 11/05/2017 CLINICAL DATA:  Cough.  Low hemoglobin.  Tracheostomy patient. EXAM: PORTABLE CHEST 1 VIEW COMPARISON:  10/14/2017 FINDINGS: Tracheostomy tube remains in place. Shallow inspiration. Linear atelectasis or fibrosis in the left mid lung is unchanged since previous study. There is new area of consolidation in the right lung base. This may indicate developing pneumonia or increasing atelectasis. No pleural effusions. No pneumothorax. Mediastinal contours appear intact. Heart size and pulmonary vascularity are normal. Calcification of the aorta. Degenerative changes in the spine and shoulders. IMPRESSION: New area of consolidation in the right lung base may represent developing pneumonia or increasing atelectasis. Persistent fibrosis or atelectasis in the left mid lung without change. Electronically Signed   By: Lucienne Capers M.D.   On: 11/05/2017 23:50    EKG: Independently reviewed.  Assessment/Plan Principal Problem:   Acute blood loss anemia Active Problems:   Acute renal failure superimposed on stage 3 chronic kidney disease (HCC)   Tracheostomy status (HCC)   Essential hypertension   Diabetes mellitus type 2,  uncontrolled, with complications (HCC)   Persistent vegetative state ([place])   Occult blood in stools   HCAP (healthcare-associated pneumonia)    1. Blood loss anemia - 1. 1u PRBC transfusion 2. Repeat CBC in AM 3. Regarding source: 1. CT seems to show improvement of abd wall hematoma and no acute bleed 2. Coughing small amount of blood from trach but this isnt anywhere near enough to explain the degree of anemia. 3. GI? Hemoccult positive but no gross melena nor hematochezia 4. Coumadin reversed with FFP and Vit K. 2. Occult blood in stools - 1. GI to see in AM 2. On PPI GTT 3. AKI on CKD  stage 3, significant uremia - 1. Tele monitor 2. EDP spoke with Dr. Joelyn Oms 1. IVF and transfuse 2. Dont need to transfer to [facility] at this time 3. Repeat BMP in AM 4. Foley in place to monitor UOP 4. HCAP - of RLL apparently 1. PNA pathway 2. Cefepime and vanc 3. Cultures pending 5. At risk for hypernatremia - 1. Developed rapid and profound hypernatremia last admit. 2. Keep close eye on BMP 3. Using half NS for maint fluids right now, switch to D5W if needed. 6. DM2 - 1. Sensitive SSI Q4H 7. HTN - continue home BP meds 8. Persistent vegetative state - chronic and now baseline.  DVT prophylaxis: SCDs Code Status: Full Family Communication: No family in room Disposition Plan: SNF after admit Consults called: EDP spoke with Dr. Rosalie Gums with GI and Dr. Joelyn Oms with nephrology Admission status: Admit to inpatient   [place], [place] [facility] Pager [phone]  If 7AM-7PM, please contact day team taking care of patient [email or web address] Password [facility]  11/06/2017, 3:48 AM

## 2017-11-06 NOTE — ED Notes (Signed)
Pt's Daughters phone number is [phone].

## 2017-11-06 NOTE — ED Notes (Signed)
Hospitalist at the bedside 

## 2017-11-06 NOTE — ED Notes (Signed)
Patient tolerating unit of packed red blood cells well. Will continue to monitor.

## 2017-11-06 NOTE — Progress Notes (Signed)
PROGRESS NOTE    [name]  WIO:[id number] DOB: [birth year] DOA: 11/05/2017 PCP: Nolene Ebbs, MD   Brief Narrative:  HPI on 11/06/2017 by Dr. Jennette Kettle  [name] is a 62 y.o. female with medical history significant of tobacco alcohol and cocaine use, CKD stage III, hyperlipidemia, hypertension, uncontrolled diabetes, history of right upper extremity DVT, history of hepatitis C. 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 Patient was most recently admitted from 4/1 to 4/11 for acute blood loss anemia and hemorrhagic shock.  Initially believed to be due to bleeding from trach site but ultimately determined to be due to abdominal wall hematoma in the R lower abdomen.  Coumadin held initially, patient transfused, and coumadin resumed on discharge. Today patient sent in to ED from SNF after serial hemoglobins (being performed since last admit) declined to 7.1 today. Patient is unable to provide any history herself due to being in persistent vegetative state.   Assessment & Plan  Admitted earlier today by Dr. Jennette Kettle. See full H&P for details.  Acute blood loss anemia/on chronic anemia, GI bleed -Baseline hemoglobin approximately 9-10, presented with a hemoglobin of 7.6 (was 7.1 prior to admission) -FOBT + -Patient transfused 2 units PRBC -CT abdomen pelvis showed no evidence of acute retroperitoneal hematoma.  3.4 cm resolving hematoma/seroma over right flank.  Mild patchy right lower lobe opacities. -Gastroenterology consulted and appreciated -Continue to monitor  CBC  Acute kidney injury on chronic kidney disease, stage III with uremia -Creatinine upon admission 2.48, currently improved to 1.66 (baseline 1.2-1.4) -Continue IVF, and receiving [facility] -EDP discussed with Dr. Joelyn Oms, nephrologist, suggested IV fluids and transfusion, does not need to be transferred to [facility] at this time. -Monitor intake and output closely -Continue to monitor BMP  RLL HCAP -CXR reviewed and shows possible area of consolidation right lung base -Pending sputum culture -Strep pneumonia urine antigen negative -Continue vancomycin and cefepime -Blood cultures pending  Diabetes mellitus, type II -PEG feeds held -Continue insulin sliding scale CBG monitoring  Essential hypertension -Continue amlodipine, clonidine, hydralazine, labetalol  Persistent vegetative state -Chronic  History of right upper extremity DVT -Coumadin held due to anemia (INR 2.44)  Decubitus ulcer, stage III -present on admission -will consult wound care  DVT Prophylaxis  SCDs  Code Status: Full  Family Communication: None at bedside  Disposition Plan: Admitted.  Pending GI consultation and recommendations  Consultants Gastroenterology Nephrology   Procedures  none  Antibiotics   Anti-infectives (From admission, onward)   Start     Dose/Rate Route Frequency Ordered Stop   11/08/17 0600  vancomycin (VANCOCIN) IVPB 1000 mg/200 mL premix     1,000 mg 200 mL/hr over 60 Minutes Intravenous Every 48 hours 11/06/17 0514     11/06/17 2000  ceFEPIme (MAXIPIME) 1 g in sodium chloride 0.9 % 100 mL IVPB     1 g 200 mL/hr over 30 Minutes Intravenous Every 24 hours 11/06/17 0514     11/06/17 0015  vancomycin (VANCOCIN) 2,000 mg in sodium chloride 0.9 % 500 mL IVPB     2,000 mg 250 mL/hr over 120 Minutes Intravenous  Once 11/05/17 2349 11/06/17 0331   11/06/17 0000  ceFEPIme (MAXIPIME) 2 g in sodium chloride 0.9 % 100 mL IVPB     2 g 200 mL/hr over 30 Minutes Intravenous NOW  11/05/17 2349 11/06/17 0129      Subjective:   [name] seen and examined today.  Vegetative state.    Objective:   Vitals:   11/06/17 1100 11/06/17 1121 11/06/17 1130 11/06/17 1200  BP: (!) 149/80  (!) 166/80 (!) 173/59  Pulse: 92 93 95 95  Resp: 10 18 (!) 27 20  Temp: 97.6 F (36.4 C)     TempSrc: Oral     SpO2: 100% 100% 100% 100%  Weight:      Height:        Intake/Output Summary (Last 24 hours) at 11/06/2017 1257 Last data filed at 11/06/2017 0443 Gross per 24 hour  Intake 2726.5 ml  Output -  Net 2726.5 ml   Filed Weights   11/05/17 2049  Weight: 80.3 kg (177 lb)    Exam  General: Well developed, chronically ill-appearing, NAD  HEENT: NCAT, mucous membranes moist.   Neck: Trach with disharge  Cardiovascular: S1 S2 auscultated, tachycardic   Respiratory: Diminished breath sounds, rales lower lung fields  Abdomen: Soft, nontender, nondistended, + bowel sounds, +peg  Extremities: warm dry without cyanosis clubbing or edema  Neuro: Awake, vegetative state chronically   Data Reviewed: I have personally reviewed following labs and imaging studies  CBC: Recent Labs  Lab 11/05/17 2200 11/06/17 0517  WBC 18.6* 14.4*  NEUTROABS 14.2*  --   HGB 7.1* 7.6*  HCT 22.6* 23.7*  MCV 95.0 93.7  PLT 255 614   Basic Metabolic Panel: Recent Labs  Lab 11/05/17 2200 11/06/17 0033 11/06/17 0517  NA 144 146* 146*  K 4.6 4.5 3.3*  CL 110 112* 122*  CO2 20* 21* 14*  GLUCOSE 214* 196* 222*  BUN 298* >300* 234*  CREATININE 2.48* 2.35* 1.66*  CALCIUM 9.0 9.1 7.1*   GFR: Estimated Creatinine Clearance: 38.5 mL/min (A) (by C-G formula based on SCr of 1.66 mg/dL (H)). Liver Function Tests: No results for input(s): AST, ALT, ALKPHOS, BILITOT, PROT, ALBUMIN in the last 168 hours. No results for input(s): LIPASE, AMYLASE in the last 168 hours. No results for input(s): AMMONIA in the last 168 hours. Coagulation Profile: Recent Labs  Lab 11/05/17 2200    INR 2.44   Cardiac Enzymes: No results for input(s): CKTOTAL, CKMB, CKMBINDEX, TROPONINI in the last 168 hours. BNP (last 3 results) No results for input(s): PROBNP in the last 8760 hours. HbA1C: No results for input(s): HGBA1C in the last 72 hours. CBG: Recent Labs  Lab 11/06/17 0520 11/06/17 0741 11/06/17 1229  GLUCAP 243* 257* 223*   Lipid Profile: No results for input(s): CHOL, HDL, LDLCALC, TRIG, CHOLHDL, LDLDIRECT in the last 72 hours. Thyroid Function Tests: No results for input(s): TSH, T4TOTAL, FREET4, T3FREE, THYROIDAB in the last 72 hours. Anemia Panel: No results for input(s): VITAMINB12, FOLATE, FERRITIN, TIBC, IRON, RETICCTPCT in the last 72 hours. Urine analysis:    Component Value Date/Time   COLORURINE YELLOW 10/08/2017 [place] 10/08/2017 1143   LABSPEC 1.012 10/08/2017 1143   PHURINE 7.0 10/08/2017 1143   GLUCOSEU NEGATIVE 10/08/2017 1143   GLUCOSEU > 1000 mg/dL (A) 11/01/2007 2056   HGBUR MODERATE (A) 10/08/2017 1143   HGBUR negative 07/15/2007 0954   BILIRUBINUR NEGATIVE 10/08/2017 1143   KETONESUR NEGATIVE 10/08/2017 1143   PROTEINUR 30 (A) 10/08/2017 1143   UROBILINOGEN 1.0 04/09/2012 1524  NITRITE NEGATIVE 10/08/2017 1143   LEUKOCYTESUR LARGE (A) 10/08/2017 1143   Sepsis Labs: @LABRCNTIP (procalcitonin:4,lacticidven:4)  ) Recent Results (from the past 240 hour(s))  Culture, respiratory (NON-Expectorated)     Status: None (Preliminary result)   Collection Time: 11/06/17  7:45 AM  Result Value Ref Range Status   Specimen Description   Final    TRACHEAL ASPIRATE Performed at [place] [address] [place], [place] [address]    Special Requests   Final    Normal Performed at [facility], [place] [address], [place], [place] [address]    Gram Stain   Final    RARE WBC PRESENT, PREDOMINANTLY PMN RARE GRAM POSITIVE COCCI Performed at [facility], [place] [address],  [place], [place] [address]    Culture PENDING  Incomplete   Report Status PENDING  Incomplete      Radiology Studies: Ct Abdomen Pelvis Wo Contrast  Result Date: 11/05/2017 CLINICAL DATA:  Anemia, abdominal distention EXAM: CT ABDOMEN AND PELVIS WITHOUT CONTRAST TECHNIQUE: Multidetector CT imaging of the abdomen and pelvis was performed following the standard protocol without IV contrast. COMPARISON:  10/08/2017 FINDINGS: Lower chest: Respiratory motion at the lung bases. Mild patchy right lower lobe opacities, favored to reflect a combination of atelectasis and mild pneumonia anteriorly (series 4/image 16). Hepatobiliary: Unenhanced liver is grossly unremarkable. Gallbladder is unremarkable. No intrahepatic extrahepatic ductal dilatation. Pancreas: Within normal limits. Spleen: Within normal limits. Adrenals/Urinary Tract: Adrenal glands are within normal limits. 3 mm nonobstructing right lower pole renal calculus (series 2/image 40). Left kidney is within normal limits. No hydronephrosis. Bladder is within normal limits. Stomach/Bowel: Percutaneous gastrostomy along the anterior gastric antrum. No evidence of bowel obstruction. Normal appendix (series 2/image 56). Left colonic diverticulosis, without evidence of diverticulitis. Vascular/Lymphatic: No evidence of abdominal aortic aneurysm. Atherosclerotic calcifications of the abdominal aorta and branch vessels. No suspicious abdominopelvic lymphadenopathy. Reproductive: Uterus is within normal limits. No adnexal masses. Other: No abdominopelvic ascites. Musculoskeletal: 3.4 cm resolving hematoma/seroma overlying the right flank (series 2/image 64). Minimal subcutaneous stranding/resolving hematoma overlying the left lower abdomen. No evidence of acute retroperitoneal hematoma. Mild degenerative changes of the visualized thoracolumbar spine. IMPRESSION: No evidence of acute retroperitoneal hematoma. 3.4 cm resolving hematoma/seroma overlying the right flank,  improved. Mild patchy right lower lobe opacities, favored to reflect a combination of atelectasis and right lower lobe pneumonia. Additional ancillary findings as above. Electronically Signed   By: Julian Hy M.D.   On: 11/05/2017 23:20   Dg Chest Portable 1 View  Result Date: 11/05/2017 CLINICAL DATA:  Cough.  Low hemoglobin.  Tracheostomy patient. EXAM: PORTABLE CHEST 1 VIEW COMPARISON:  10/14/2017 FINDINGS: Tracheostomy tube remains in place. Shallow inspiration. Linear atelectasis or fibrosis in the left mid lung is unchanged since previous study. There is new area of consolidation in the right lung base. This may indicate developing pneumonia or increasing atelectasis. No pleural effusions. No pneumothorax. Mediastinal contours appear intact. Heart size and pulmonary vascularity are normal. Calcification of the aorta. Degenerative changes in the spine and shoulders. IMPRESSION: New area of consolidation in the right lung base may represent developing pneumonia or increasing atelectasis. Persistent fibrosis or atelectasis in the left mid lung without change. Electronically Signed   By: Lucienne Capers M.D.   On: 11/05/2017 23:50     Scheduled Meds: . amLODipine  10 mg Per Tube Daily  . atorvastatin  40 mg Per Tube q1800  . cloNIDine  0.2 mg Per Tube TID  .  folic acid  1 mg Per Tube Daily  . hydrALAZINE  100 mg Per Tube Q8H  . insulin aspart  0-9 Units Subcutaneous Q4H  . labetalol  400 mg Per Tube TID  . lacosamide  200 mg Per Tube BID  . levETIRAcetam  1,000 mg Per Tube BID  . multivitamin  15 mL Per Tube Daily  . [START ON 11/09/2017] pantoprazole  40 mg Intravenous Q12H  . phenytoin  250 mg Per Tube BID  . scopolamine  1 patch Transdermal Q72H  . thiamine  100 mg Per Tube Daily  . topiramate  200 mg Per Tube BID   Continuous Infusions: . sodium chloride 100 mL/hr at 11/06/17 0435  . ceFEPime (MAXIPIME) IV    . pantoprozole (PROTONIX) infusion 8 mg/hr (11/06/17 0233)  .  [START ON 11/08/2017] vancomycin       LOS: 0 days   Time Spent in minutes   30 minutes  [name] D.O. on 11/06/2017 at 12:57 PM  Between 7am to 7pm - Pager - [phone]  After 7pm go to [email or web address] - password [email or web address]  And look for the night coverage person covering for me after hours  [facility] Office  [phone]

## 2017-11-06 NOTE — ED Notes (Signed)
Respiratory at the bedside to suction trach and obtain sputum culture and gram stain.

## 2017-11-06 NOTE — ED Notes (Signed)
ED TO INPATIENT HANDOFF REPORT  Name/Age/Gender [name] 62 y.o. female  Code Status    Code Status Orders  (From admission, onward)        Start     Ordered   11/06/17 0341  Full code  Continuous     11/06/17 0341    Code Status History    Date Active Date Inactive Code Status Order ID Comments User Context   10/08/2017 0824 10/18/2017 1554 Full Code [id number]  Georgette Shell, MD Inpatient   08/21/2017 0905 09/14/2017 2131 Full Code [id number]  Cherene Altes, MD Inpatient   08/18/2017 2346 08/21/2017 0905 Full Code [id number]  Corrie Mckusick, DO Inpatient   08/09/2017 1138 08/18/2017 2346 Partial Code [id number]  Rush Farmer, MD Inpatient   08/02/2017 1736 08/09/2017 1138 Full Code [id number]  Sampson Goon, MD ED   05/26/2017 0803 06/04/2017 2024 Full Code [id number]  Greta Doom, MD ED   05/25/2017 2327 05/26/2017 0802 Full Code [id number]  Ivor Costa, MD ED   05/10/2017 1915 05/15/2017 2112 Full Code [id number]  Florencia Reasons, MD Inpatient   11/06/2016 1509 11/12/2016 1737 Full Code [id number]  Bary Leriche, PA-C Inpatient   10/31/2016 1900 11/02/2016 1820 Full Code [id number]  Omar Person, NP ED   06/09/2016 1801 06/20/2016 1532 Full Code [id number]  Cathlyn Parsons, PA-C Inpatient   06/09/2016 1801 06/09/2016 1801 Full Code [id number]  Cathlyn Parsons, PA-C Inpatient   06/06/2016 1705 06/09/2016 1744 Full Code [id number]  Darrel Reach, MD ED   05/15/2011 1636 05/16/2011 1842 Full Code [id number]  Joanna Hews, RN Inpatient      Home/SNF/Other Skilled nursing facility  Chief Complaint Low Hemoglobin  Level of Care/Admitting Diagnosis ED Disposition    ED Disposition Condition [facility] Area: [facility] [[id number]]  Level of Care: Stepdown [14]  Admit to SDU based on following criteria: Hemodynamic compromise or significant risk of instability:  Patient requiring short term acute titration and management of  vasoactive drips, and invasive monitoring (i.e., CVP and Arterial line).  Admit to SDU based on following criteria: Severe physiological/psychological symptoms:  Any diagnosis requiring assessment & intervention at least every 4 hours on an ongoing basis to obtain desired patient outcomes including stability and rehabilitation  Admit to SDU based on following criteria: Other see comments  Comments: Total care patient with trach, peg.  Diagnosis: Acute blood loss anemia [761607]  Admitting Physician: Doreatha Massed  Attending Physician: Etta Quill [phone]  Estimated length of stay: past midnight tomorrow  Certification:: I certify this patient will need inpatient services for at least 2 midnights  PT Class (Do Not Modify): Inpatient [101]  PT Acc Code (Do Not Modify): Private [1]       Medical History Past Medical History:  Diagnosis Date  . Acute deep vein thrombosis (DVT) of right upper extremity ([place]) 08/30/2017  . Chronic diastolic CHF (congestive heart failure) ([place]) 08/30/2017  . Cocaine abuse, continuous ([place])   . Diabetes mellitus    Hb A1C = 12.6 on 05/15/11, managed on Novolog 70/30, 35 U qam, 25 U qpm  . Hypertension    poorly controlled  . Insomnia disorder   . Shortness of breath   . Stroke [facility])     Allergies No Known Allergies  IV Location/Drains/Wounds Patient Lines/Drains/Airways Status   Active Line/Drains/Airways    Name:   Placement date:   Placement time:   Site:  Days:   Peripheral IV 11/05/17 Right Hand   11/05/17    -    Hand   1   Peripheral IV 11/06/17 Left Antecubital   11/06/17    0012    Antecubital   less than 1   Peripheral IV 11/06/17 Right Forearm   11/06/17    0031    Forearm   less than 1   Gastrostomy/Enterostomy Gastrostomy 20 Fr. LUQ   08/17/17    1123    LUQ   81   Urethral Catheter Ilene C. RN Latex 14 Fr.   11/06/17    0045    Latex   less than 1   Tracheostomy Shiley 6 mm Uncuffed   09/07/17    1343    6 mm   60    Pressure Injury 10/08/17 Unstageable - Full thickness tissue loss in which the base of the ulcer is covered by slough (yellow, tan, gray, green or brown) and/or eschar (tan, brown or black) in the wound bed. unstageable when assessed on 4/30   10/08/17    0830     29   Wound / Incision (Open or Dehisced) 10/08/17 Other (Comment) Pretibial Right;Lateral   10/08/17    0830    Pretibial   29          Labs/Imaging Results for orders placed or performed during the hospital encounter of 11/05/17 (from the past 48 hour(s))  CBC with Differential     Status: Abnormal   Collection Time: 11/05/17 10:00 PM  Result Value Ref Range   WBC 18.6 (H) 4.0 - 10.5 K/uL   RBC 2.38 (L) 3.87 - 5.11 MIL/uL   Hemoglobin 7.1 (L) 12.0 - 15.0 g/dL   HCT 22.6 (L) 36.0 - 46.0 %   MCV 95.0 78.0 - 100.0 fL   MCH 29.8 26.0 - 34.0 pg   MCHC 31.4 30.0 - 36.0 g/dL   RDW 17.4 (H) 11.5 - 15.5 %   Platelets 255 150 - 400 K/uL   Neutrophils Relative % 76 %   Neutro Abs 14.2 (H) 1.7 - 7.7 K/uL   Lymphocytes Relative 18 %   Lymphs Abs 3.3 0.7 - 4.0 K/uL   Monocytes Relative 3 %   Monocytes Absolute 0.6 0.1 - 1.0 K/uL   Eosinophils Relative 3 %   Eosinophils Absolute 0.5 0.0 - 0.7 K/uL   Basophils Relative 0 %   Basophils Absolute 0.0 0.0 - 0.1 K/uL    Comment: Performed at [facility], [place] [address], [place], [place] [address]  Basic metabolic panel     Status: Abnormal   Collection Time: 11/05/17 10:00 PM  Result Value Ref Range   Sodium 144 135 - 145 mmol/L   Potassium 4.6 3.5 - 5.1 mmol/L   Chloride 110 101 - 111 mmol/L   CO2 20 (L) 22 - 32 mmol/L   Glucose, Bld 214 (H) 65 - 99 mg/dL   BUN 298 (H) 6 - 20 mg/dL    Comment: RESULTS CONFIRMED BY MANUAL DILUTION   Creatinine, Ser 2.48 (H) 0.44 - 1.00 mg/dL   Calcium 9.0 8.9 - 10.3 mg/dL   GFR calc non Af Amer 20 (L) >60 mL/min   GFR calc Af Amer 23 (L) >60 mL/min    Comment: (NOTE) The eGFR has been calculated using the CKD EPI  equation. This calculation has not been validated in all clinical situations. eGFR's persistently <60 mL/min signify possible Chronic Kidney Disease.  Anion gap 14 5 - 15    Comment: Performed at [facility], [place] [address] [place], [place] [address]  Protime-INR     Status: Abnormal   Collection Time: 11/05/17 10:00 PM  Result Value Ref Range   Prothrombin Time 26.3 (H) 11.4 - 15.2 seconds   INR 2.44     Comment: Performed at [facility], [place] [address] [place], [place] [address]  Type and screen [place]     Status: None (Preliminary result)   Collection Time: 11/05/17 10:00 PM  Result Value Ref Range   ABO/RH(D) O POS    Antibody Screen NEG    Sample Expiration 11/08/2017    Unit Number [id number]    Blood Component Type RED CELLS,LR    Unit division 00    Status of Unit ISSUED    Transfusion Status OK TO TRANSFUSE    Crossmatch Result Compatible    Unit Number [id number]    Blood Component Type RED CELLS,LR    Unit division 00    Status of Unit ISSUED    Transfusion Status OK TO TRANSFUSE    Crossmatch Result      Compatible Performed at [facility], [place] [address], [place], [place] [address]    Unit Number [id number]    Blood Component Type RED CELLS,LR    Unit division 00    Status of Unit ALLOCATED    Transfusion Status OK TO TRANSFUSE    Crossmatch Result Compatible    Unit Number [id number]    Blood Component Type RED CELLS,LR    Unit division 00    Status of Unit ALLOCATED    Transfusion Status OK TO TRANSFUSE    Crossmatch Result Compatible   I-Stat CG4 Lactic Acid, ED     Status: None   Collection Time: 11/05/17 10:08 PM  Result Value Ref Range   Lactic Acid, Venous 0.94 0.5 - 1.9 mmol/L  Prepare RBC     Status: None   Collection Time: 11/05/17 10:26 PM  Result Value Ref Range   Order Confirmation      ORDER PROCESSED BY BLOOD BANK Performed at [facility], [place] [address], [place],  [address]   POC occult blood, ED     Status: Abnormal   Collection Time: 11/05/17 10:34 PM  Result Value Ref Range   Fecal Occult Bld POSITIVE (A) NEGATIVE  Basic metabolic panel     Status: Abnormal   Collection Time: 11/06/17 12:33 AM  Result Value Ref Range   Sodium 146 (H) 135 - 145 mmol/L   Potassium 4.5 3.5 - 5.1 mmol/L   Chloride 112 (H) 101 - 111 mmol/L   CO2 21 (L) 22 - 32 mmol/L   Glucose, Bld 196 (H) 65 - 99 mg/dL   BUN >300 (H) 6 - 20 mg/dL    Comment: RESULTS CONFIRMED BY MANUAL DILUTION   Creatinine, Ser 2.35 (H) 0.44 - 1.00 mg/dL   Calcium 9.1 8.9 - 10.3 mg/dL   GFR calc non Af Amer 21 (L) >60 mL/min   GFR calc Af Amer 25 (L) >60 mL/min    Comment: (NOTE) The eGFR has been calculated using the CKD EPI equation. This calculation has not been validated in all clinical situations. eGFR's persistently <60 mL/min signify possible Chronic Kidney Disease.    Anion gap 13 5 - 15    Comment: Performed at [facility], [place] [address] [place],  [place] [address]  Prepare fresh frozen plasma     Status: None (Preliminary result)   Collection Time: 11/06/17 12:41 AM  Result Value Ref Range   Unit Number [id number]    Blood Component Type THAWED PLASMA    Unit division 00    Status of Unit ISSUED    Transfusion Status OK TO TRANSFUSE   Prepare RBC     Status: None   Collection Time: 11/06/17  1:30 AM  Result Value Ref Range   Order Confirmation      ORDER PROCESSED BY BLOOD BANK Performed at [facility], [place] [address], [place], [place] [address]   CBC     Status: Abnormal   Collection Time: 11/06/17  5:17 AM  Result Value Ref Range   WBC 14.4 (H) 4.0 - 10.5 K/uL   RBC 2.53 (L) 3.87 - 5.11 MIL/uL   Hemoglobin 7.6 (L) 12.0 - 15.0 g/dL   HCT 23.7 (L) 36.0 - 46.0 %   MCV 93.7 78.0 - 100.0 fL   MCH 30.0 26.0 - 34.0 pg   MCHC 32.1 30.0 - 36.0 g/dL   RDW 17.9 (H) 11.5 -  15.5 %   Platelets 172 150 - 400 K/uL    Comment: Performed at [facility], [place] [address], [place], [place] [address]  Basic metabolic panel     Status: Abnormal   Collection Time: 11/06/17  5:17 AM  Result Value Ref Range   Sodium 146 (H) 135 - 145 mmol/L   Potassium 3.3 (L) 3.5 - 5.1 mmol/L    Comment: DELTA CHECK NOTED   Chloride 122 (H) 101 - 111 mmol/L   CO2 14 (L) 22 - 32 mmol/L   Glucose, Bld 222 (H) 65 - 99 mg/dL   BUN 234 (H) 6 - 20 mg/dL    Comment: RESULTS CONFIRMED BY MANUAL DILUTION   Creatinine, Ser 1.66 (H) 0.44 - 1.00 mg/dL   Calcium 7.1 (L) 8.9 - 10.3 mg/dL   GFR calc non Af Amer 33 (L) >60 mL/min   GFR calc Af Amer 38 (L) >60 mL/min    Comment: (NOTE) The eGFR has been calculated using the CKD EPI equation. This calculation has not been validated in all clinical situations. eGFR's persistently <60 mL/min signify possible Chronic Kidney Disease.    Anion gap 10 5 - 15    Comment: Performed at [facility], [place] [address] [place], [place] [address]  HIV antibody (Routine Screening)     Status: None   Collection Time: 11/06/17  5:17 AM  Result Value Ref Range   HIV Screen 4th Generation wRfx Non Reactive Non Reactive    Comment: (NOTE) Performed At: [facility] [place], [place] [id number] Rush Farmer MD [id number] Performed at [facility], [place] [address] [place], [place] [address]   Strep pneumoniae urinary antigen     Status: None   Collection Time: 11/06/17  5:17 AM  Result Value Ref Range   Strep Pneumo Urinary Antigen NEGATIVE NEGATIVE    Comment:        Infection due to S. pneumoniae cannot be absolutely ruled out since the antigen present may be below the detection limit of the test. Performed at [facility], [address], [place], [place] [address]   CBG monitoring, ED     Status: Abnormal   Collection Time: 11/06/17  5:20 AM  Result Value Ref  Range   Glucose-Capillary 243 (H)  65 - 99 mg/dL  CBG monitoring, ED     Status: Abnormal   Collection Time: 11/06/17  7:41 AM  Result Value Ref Range   Glucose-Capillary 257 (H) 65 - 99 mg/dL  Culture, respiratory (NON-Expectorated)     Status: None (Preliminary result)   Collection Time: 11/06/17  7:45 AM  Result Value Ref Range   Specimen Description      TRACHEAL ASPIRATE Performed at [place] [address] [place], [place] [address]    Special Requests      Normal Performed at [facility], [place] [address] [place], [place] [address]    Gram Stain      RARE WBC PRESENT, PREDOMINANTLY PMN RARE GRAM POSITIVE COCCI Performed at [facility], [place] [address] [place], [place] [address]    Culture PENDING    Report Status PENDING   CBG monitoring, ED     Status: Abnormal   Collection Time: 11/06/17 12:29 PM  Result Value Ref Range   Glucose-Capillary 223 (H) 65 - 99 mg/dL  CBG monitoring, ED     Status: Abnormal   Collection Time: 11/06/17  3:45 PM  Result Value Ref Range   Glucose-Capillary 312 (H) 65 - 99 mg/dL   Ct Abdomen Pelvis Wo Contrast  Result Date: 11/05/2017 CLINICAL DATA:  Anemia, abdominal distention EXAM: CT ABDOMEN AND PELVIS WITHOUT CONTRAST TECHNIQUE: Multidetector CT imaging of the abdomen and pelvis was performed following the standard protocol without IV contrast. COMPARISON:  10/08/2017 FINDINGS: Lower chest: Respiratory motion at the lung bases. Mild patchy right lower lobe opacities, favored to reflect a combination of atelectasis and mild pneumonia anteriorly (series 4/image 16). Hepatobiliary: Unenhanced liver is grossly unremarkable. Gallbladder is unremarkable. No intrahepatic extrahepatic ductal dilatation. Pancreas: Within normal limits. Spleen: Within normal limits. Adrenals/Urinary Tract: Adrenal glands are within normal limits. 3 mm nonobstructing right lower pole renal calculus (series 2/image 40).  Left kidney is within normal limits. No hydronephrosis. Bladder is within normal limits. Stomach/Bowel: Percutaneous gastrostomy along the anterior gastric antrum. No evidence of bowel obstruction. Normal appendix (series 2/image 56). Left colonic diverticulosis, without evidence of diverticulitis. Vascular/Lymphatic: No evidence of abdominal aortic aneurysm. Atherosclerotic calcifications of the abdominal aorta and branch vessels. No suspicious abdominopelvic lymphadenopathy. Reproductive: Uterus is within normal limits. No adnexal masses. Other: No abdominopelvic ascites. Musculoskeletal: 3.4 cm resolving hematoma/seroma overlying the right flank (series 2/image 64). Minimal subcutaneous stranding/resolving hematoma overlying the left lower abdomen. No evidence of acute retroperitoneal hematoma. Mild degenerative changes of the visualized thoracolumbar spine. IMPRESSION: No evidence of acute retroperitoneal hematoma. 3.4 cm resolving hematoma/seroma overlying the right flank, improved. Mild patchy right lower lobe opacities, favored to reflect a combination of atelectasis and right lower lobe pneumonia. Additional ancillary findings as above. Electronically Signed   By: Julian Hy M.D.   On: 11/05/2017 23:20   Dg Chest Portable 1 View  Result Date: 11/05/2017 CLINICAL DATA:  Cough.  Low hemoglobin.  Tracheostomy patient. EXAM: PORTABLE CHEST 1 VIEW COMPARISON:  10/14/2017 FINDINGS: Tracheostomy tube remains in place. Shallow inspiration. Linear atelectasis or fibrosis in the left mid lung is unchanged since previous study. There is new area of consolidation in the right lung base. This may indicate developing pneumonia or increasing atelectasis. No pleural effusions. No pneumothorax. Mediastinal contours appear intact. Heart size and pulmonary vascularity are normal. Calcification of the aorta. Degenerative changes in the spine and shoulders. IMPRESSION: New area of consolidation in the right lung base  may  represent developing pneumonia or increasing atelectasis. Persistent fibrosis or atelectasis in the left mid lung without change. Electronically Signed   By: Lucienne Capers M.D.   On: 11/05/2017 23:50    Pending Labs Unresulted Labs (From admission, onward)   Start     Ordered   11/07/17 0500  CBC  Tomorrow morning,   R     11/06/17 1314   11/07/17 2440  Basic metabolic panel  Tomorrow morning,   R     11/06/17 1314   11/06/17 0451  Culture, sputum-assessment  Once,   R     11/06/17 0451   11/06/17 0451  Gram stain  Once,   R     11/06/17 0451   11/05/17 2326  Blood culture (routine x 2)  BLOOD CULTURE X 2,   STAT    Question:  Patient immune status  Answer:  Normal   11/05/17 2325      Vitals/Pain Today's Vitals   11/06/17 1930 11/06/17 2000 11/06/17 2030 11/06/17 2100  BP: 135/65 125/63 (!) 149/75 (!) 144/69  Pulse: 91 89 93 94  Resp: (!) 27 (!) 24 (!) 23 (!) 22  Temp:      TempSrc:      SpO2: 100% 100% 100% 100%  Weight:      Height:        Isolation Precautions No active isolations  Medications Medications  iopamidol (ISOVUE-300) 61 % injection (has no administration in time range)  pantoprazole (PROTONIX) injection 40 mg (has no administration in time range)  pantoprazole (PROTONIX) 80 mg in sodium chloride 0.9 % 250 mL (0.32 mg/mL) infusion (8 mg/hr Intravenous New Bag/Given 11/06/17 1311)  levETIRAcetam (KEPPRA) 100 MG/ML solution 1,000 mg (1,000 mg Per Tube Given 11/06/17 1325)  acetaminophen (TYLENOL) tablet 650 mg (has no administration in time range)    Or  acetaminophen (TYLENOL) suppository 650 mg (has no administration in time range)  ondansetron (ZOFRAN) tablet 4 mg (has no administration in time range)    Or  ondansetron (ZOFRAN) injection 4 mg (has no administration in time range)  0.45 % sodium chloride infusion ( Intravenous New Bag/Given 11/06/17 1311)  insulin aspart (novoLOG) injection 0-9 Units (7 Units Subcutaneous Given 11/06/17 1640)   albuterol (PROVENTIL) (2.5 MG/3ML) 0.083% nebulizer solution 2.5 mg (has no administration in time range)  acetaminophen (TYLENOL) solution 325 mg (has no administration in time range)  atorvastatin (LIPITOR) tablet 40 mg (has no administration in time range)  cloNIDine (CATAPRES) tablet 0.2 mg (0.2 mg Per Tube Given 11/06/17 1314)  amLODipine (NORVASC) tablet 10 mg (10 mg Per Tube Given 11/06/17 1314)  hydrALAZINE (APRESOLINE) tablet 100 mg (0 mg Per Tube Hold 07/11/70 5366)  folic acid (FOLVITE) tablet 1 mg (1 mg Per Tube Given 11/06/17 1314)  docusate (COLACE) 50 MG/5ML liquid 50 mg (has no administration in time range)  labetalol (NORMODYNE) tablet 400 mg (400 mg Per Tube Given 11/06/17 1313)  lacosamide (VIMPAT) tablet 200 mg (200 mg Per Tube Given 11/06/17 1313)  multivitamin liquid 15 mL (15 mLs Per Tube Given 11/06/17 1324)  scopolamine (TRANSDERM-SCOP) 1 MG/3DAYS 1.5 mg (1.5 mg Transdermal Patch Applied 11/06/17 0658)  thiamine (VITAMIN B-1) tablet 100 mg (100 mg Per Tube Given 11/06/17 1314)  topiramate (TOPAMAX) tablet 200 mg (200 mg Per Tube Given 11/06/17 1313)  ceFEPIme (MAXIPIME) 1 g in sodium chloride 0.9 % 100 mL IVPB (has no administration in time range)  vancomycin (VANCOCIN) IVPB 1000 mg/200 mL premix (has no administration in  time range)  collagenase (SANTYL) ointment (has no administration in time range)  phenytoin (DILANTIN) 125 MG/5ML suspension 250 mg (has no administration in time range)  0.9 %  sodium chloride infusion ( Intravenous Stopped 11/06/17 0441)  ceFEPIme (MAXIPIME) 2 g in sodium chloride 0.9 % 100 mL IVPB (0 g Intravenous Stopped 11/06/17 0129)  vancomycin (VANCOCIN) 2,000 mg in sodium chloride 0.9 % 500 mL IVPB (0 mg Intravenous Stopped 11/06/17 0331)  sodium chloride 0.9 % bolus 1,000 mL (0 mLs Intravenous Stopped 11/06/17 0157)  pantoprazole (PROTONIX) 80 mg in sodium chloride 0.9 % 100 mL IVPB (0 mg Intravenous Stopped 11/06/17 0232)  0.9 %  sodium chloride  infusion (0 mL/hr Intravenous Stopped 11/06/17 1350)  phytonadione (VITAMIN K) 10 mg in dextrose 5 % 50 mL IVPB (0 mg Intravenous Stopped 11/06/17 0201)  0.9 %  sodium chloride infusion ( Intravenous Stopped 11/06/17 1350)    Mobility non-ambulatory

## 2017-11-06 NOTE — Progress Notes (Signed)
CSW spoke with pt's RN who will call and update pt's daughter Royston Bake at ph: [phone].  Please reconsult if future social work needs arise.  CSW signing off, as social work intervention is no longer needed.  Alphonse Guild. Sayge Salvato, LCSW, LCAS, CSI Clinical Social Worker Ph: [phone]

## 2017-11-07 DIAGNOSIS — Z515 Encounter for palliative care: Secondary | ICD-10-CM

## 2017-11-07 DIAGNOSIS — Z7189 Other specified counseling: Secondary | ICD-10-CM

## 2017-11-07 DIAGNOSIS — I69319 Unspecified symptoms and signs involving cognitive functions following cerebral infarction: Secondary | ICD-10-CM

## 2017-11-07 DIAGNOSIS — E870 Hyperosmolality and hypernatremia: Secondary | ICD-10-CM

## 2017-11-07 LAB — BASIC METABOLIC PANEL
ANION GAP: 10 (ref 5–15)
BUN: 174 mg/dL — ABNORMAL HIGH (ref 6–20)
CO2: 19 mmol/L — ABNORMAL LOW (ref 22–32)
Calcium: 9.1 mg/dL (ref 8.9–10.3)
Chloride: 127 mmol/L — ABNORMAL HIGH (ref 101–111)
Creatinine, Ser: 1.47 mg/dL — ABNORMAL HIGH (ref 0.44–1.00)
GFR calc Af Amer: 44 mL/min — ABNORMAL LOW (ref >60)
GFR, EST NON AFRICAN AMERICAN: 38 mL/min — AB (ref >60)
Glucose, Bld: 327 mg/dL — ABNORMAL HIGH (ref 65–99)
POTASSIUM: 3.7 mmol/L (ref 3.5–5.1)
SODIUM: 156 mmol/L — AB (ref 135–145)

## 2017-11-07 LAB — CBC
HCT: 27.4 % — ABNORMAL LOW (ref 36.0–46.0)
Hemoglobin: 8.7 g/dL — ABNORMAL LOW (ref 12.0–15.0)
MCH: 29.5 pg (ref 26.0–34.0)
MCHC: 31.8 g/dL (ref 30.0–36.0)
MCV: 92.9 fL (ref 78.0–100.0)
PLATELETS: 209 10*3/uL (ref 150–400)
RBC: 2.95 MIL/uL — AB (ref 3.87–5.11)
RDW: 18.4 % — ABNORMAL HIGH (ref 11.5–15.5)
WBC: 16.1 10*3/uL — AB (ref 4.0–10.5)

## 2017-11-07 LAB — BPAM FFP
Blood Product Expiration Date: 201905052359
ISSUE DATE / TIME: 201904300555
UNIT TYPE AND RH: 5100

## 2017-11-07 LAB — GLUCOSE, CAPILLARY
GLUCOSE-CAPILLARY: 300 mg/dL — AB (ref 65–99)
GLUCOSE-CAPILLARY: 215 mg/dL — AB (ref 65–99)
GLUCOSE-CAPILLARY: 298 mg/dL — AB (ref 65–99)
GLUCOSE-CAPILLARY: 363 mg/dL — AB (ref 65–99)
Glucose-Capillary: 341 mg/dL — ABNORMAL HIGH (ref 65–99)
Glucose-Capillary: 322 mg/dL — ABNORMAL HIGH (ref 65–99)

## 2017-11-07 LAB — PHOSPHORUS
Phosphorus: 3.3 mg/dL (ref 2.5–4.6)
Phosphorus: 3.4 mg/dL (ref 2.5–4.6)

## 2017-11-07 LAB — MRSA PCR SCREENING: MRSA by PCR: NEGATIVE

## 2017-11-07 LAB — PREPARE FRESH FROZEN PLASMA: UNIT DIVISION: 0

## 2017-11-07 LAB — MAGNESIUM
MAGNESIUM: 2 mg/dL (ref 1.7–2.4)
Magnesium: 2 mg/dL (ref 1.7–2.4)

## 2017-11-07 MED ORDER — SODIUM CHLORIDE 0.9 % IV SOLN: INTRAVENOUS | Status: DC

## 2017-11-07 MED ORDER — SODIUM CHLORIDE 0.9 % IV SOLN
1.0000 g | Freq: Two times a day (BID) | INTRAVENOUS | Status: DC
  Administered 2017-11-07 – 2017-11-09 (×5): 1 g via INTRAVENOUS
  Filled 2017-11-07 (×6): qty 1

## 2017-11-07 MED ORDER — PRO-STAT SUGAR FREE PO LIQD
30.0000 mL | Freq: Two times a day (BID) | ORAL | Status: DC
  Administered 2017-11-07 – 2017-11-16 (×19): 30 mL
  Filled 2017-11-07 (×18): qty 30

## 2017-11-07 MED ORDER — INSULIN GLARGINE 100 UNIT/ML ~~LOC~~ SOLN
12.0000 [IU] | Freq: Two times a day (BID) | SUBCUTANEOUS | Status: DC
  Administered 2017-11-07: 12 [IU] via SUBCUTANEOUS
  Filled 2017-11-07 (×2): qty 0.12

## 2017-11-07 MED ORDER — DEXTROSE 5 % IV SOLN
INTRAVENOUS | Status: DC
  Administered 2017-11-07: 1000 mL via INTRAVENOUS
  Administered 2017-11-07: 21:00:00 via INTRAVENOUS

## 2017-11-07 MED ORDER — FREE WATER
200.0000 mL | Freq: Four times a day (QID) | Status: DC
  Administered 2017-11-07 – 2017-11-09 (×7): 200 mL

## 2017-11-07 MED ORDER — VANCOMYCIN HCL IN DEXTROSE 1-5 GM/200ML-% IV SOLN: 1000.0000 mg | INTRAVENOUS | Status: DC

## 2017-11-07 MED ORDER — GLUCERNA 1.2 CAL PO LIQD
1000.0000 mL | ORAL | Status: DC
  Administered 2017-11-07 – 2017-11-12 (×7): 1000 mL
  Administered 2017-11-13: 70 mL
  Filled 2017-11-07 (×21): qty 1000

## 2017-11-07 MED ORDER — VITAL HIGH PROTEIN PO LIQD: 1000.0000 mL | ORAL | Status: DC

## 2017-11-07 MED ORDER — DEXTROSE 5 % IV SOLN: INTRAVENOUS | Status: DC

## 2017-11-07 NOTE — Consult Note (Addendum)
Consultation Note Date: 11/07/2017   Patient Name: [name]  DOB: 07-01-1956  MRN: [id number]  Age / Sex: 62 y.o., female  PCP: [name] Ebbs, MD Referring Physician: Cristal Ford, DO  Reason for Consultation: Establishing goals of care  HPI/Patient Profile: 62 y.o. female  with past medical history of recurrent strokes complicated by seizures s/t cocaine abuse, diastolic CHF, DM2, RUE DVT, CKD stage III, hepatitis C admitted on 11/05/2017 with recurrent anemia with right lower abd hematoma discovered last admission (this is improving on CT scan). Treating conservatively with protonix infusion and infusions with serial CBCs. No plans for GI intervention/scope. Also being treated for RLL pneumonia.   Clinical Assessment and Goals of Care: I met today with [name] but she is minimally responsive. She does not follow any commands but does withdraw from stimuli. She does not track or have any meaningful interaction.   I was able to reach daughter, [name], who lives in [place]. We discussed her mother's lack of progress over these months and that this is not expected to improve but in fact get worse with expected increase in infections and skin breakdown. We discussed QOL and suffering. [name] seems interested in pursuing more comfort measures. We discussed the next steps are to set some limitations in pursuing more aggressive measures and focusing more on comfort, peace, and dignity. [name] would like to speak more with the doctor before making any decisions. She also shares that she needs to plan to come from [place] to visit as well.   Primary Decision Maker NEXT OF KIN daughter [name]    SUMMARY OF RECOMMENDATIONS   - Plan to speak more with [name] along with [name] tomorrow 1000 am  Code Status/Advance Care Planning:  Full code   Symptom Management:   No current symptoms to  manage  Palliative Prophylaxis:   Aspiration, Bowel Regimen, Delirium Protocol, Frequent Pain Assessment, Oral Care, Palliative Wound Care and Turn Reposition  Additional Recommendations (Limitations, Scope, Preferences):  Full Scope Treatment  Psycho-social/Spiritual:   Desire for further Chaplaincy support:no  Additional Recommendations: Education on Hospice and Grief/Bereavement Support  Prognosis:   Prognosis very poor especially with transition to comfort focused care.   Discharge Planning: To Be Determined      Primary Diagnoses: Present on Admission: . Acute blood loss anemia . Persistent vegetative state ([place]) . Acute renal failure superimposed on stage 3 chronic kidney disease ([place]) . Diabetes mellitus type 2, uncontrolled, with complications ([place]) . Essential hypertension . Occult blood in stools . HCAP (healthcare-associated pneumonia)   I have reviewed the medical record, interviewed the patient and family, and examined the patient. The following aspects are pertinent.  Past Medical History:  Diagnosis Date  . Acute deep vein thrombosis (DVT) of right upper extremity ([place]) 08/30/2017  . Chronic diastolic CHF (congestive heart failure) ([place]) 08/30/2017  . Cocaine abuse, continuous ([place])   . Diabetes mellitus    Hb A1C = 12.6 on 05/15/11, managed on Novolog 70/30, 35 U qam, 25 U qpm  .  Hypertension    poorly controlled  . Insomnia disorder   . Shortness of breath   . Stroke [facility])    Social History   Socioeconomic History  . Marital status: Single    Spouse name: Not on file  . Number of children: Not on file  . Years of education: Not on file  . Highest education level: Not on file  Occupational History  . Not on file  Social Needs  . Financial resource strain: Not on file  . Food insecurity:    Worry: Not on file    Inability: Not on file  . Transportation needs:    Medical: Not on file    Non-medical: Not on file  Tobacco Use  . Smoking  status: Current Every Day Smoker    Packs/day: 1.50    Years: 25.00    Pack years: 37.50    Types: Cigarettes  . Smokeless tobacco: Never Used  Substance and Sexual Activity  . Alcohol use: Yes    Alcohol/week: 4.8 oz    Types: 2 Glasses of wine, 6 Cans of beer per week    Comment: pint of liquor a week  . Drug use: Yes    Types: Cocaine    Comment: 07/2015 last use   . Sexual activity: Yes    Birth control/protection: Post-menopausal  Lifestyle  . Physical activity:    Days per week: Not on file    Minutes per session: Not on file  . Stress: Not on file  Relationships  . Social connections:    Talks on phone: Not on file    Gets together: Not on file    Attends religious service: Not on file    Active member of club or organization: Not on file    Attends meetings of clubs or organizations: Not on file    Relationship status: Not on file  Other Topics Concern  . Not on file  Social History Narrative  . Not on file   Family History  Problem Relation Age of Onset  . Stroke Father   . Diabetes Sister   . Diabetes Brother    Scheduled Meds: . amLODipine  10 mg Per Tube Daily  . atorvastatin  40 mg Per Tube q1800  . cloNIDine  0.2 mg Per Tube TID  . collagenase   Topical Daily  . feeding supplement (PRO-STAT SUGAR FREE 64)  30 mL Per Tube BID  . folic acid  1 mg Per Tube Daily  . free water  200 mL Per Tube Q6H  . hydrALAZINE  100 mg Per Tube Q8H  . insulin aspart  0-9 Units Subcutaneous Q4H  . labetalol  400 mg Per Tube TID  . lacosamide  200 mg Per Tube BID  . levETIRAcetam  1,000 mg Per Tube BID  . multivitamin  15 mL Per Tube Daily  . [START ON 11/09/2017] pantoprazole  40 mg Intravenous Q12H  . phenytoin  250 mg Oral BID  . scopolamine  1 patch Transdermal Q72H  . thiamine  100 mg Per Tube Daily  . topiramate  200 mg Per Tube BID   Continuous Infusions: . ceFEPime (MAXIPIME) IV Stopped (11/07/17 1409)  . dextrose 1,000 mL (11/07/17 0815)  . feeding  supplement (GLUCERNA 1.2 CAL)    . pantoprozole (PROTONIX) infusion 8 mg/hr (11/07/17 1120)   PRN Meds:.acetaminophen, acetaminophen **OR** acetaminophen, albuterol, docusate, ondansetron **OR** ondansetron (ZOFRAN) IV No Known Allergies Review of Systems  Unable to perform ROS: Patient nonverbal  Physical Exam  Constitutional: She appears well-developed.  HENT:  Head: Normocephalic and atraumatic.  Cardiovascular: Normal rate and regular rhythm.  Pulmonary/Chest: No accessory muscle usage. No tachypnea. No respiratory distress. She has rhonchi.  Abdominal: Soft. Normal appearance.  Neurological:  Minimally responsive  Nursing note and vitals reviewed.   Vital Signs: BP (!) 153/72   Pulse 98   Temp 99.2 F (37.3 C) (Axillary)   Resp (!) 26   Ht _0  (1.676 m)   Wt 80.3 kg (177 lb)   SpO2 100%   BMI 28.57 kg/m  Pain Scale: PAINAD       SpO2: SpO2: 100 % O2 Device:SpO2: 100 % O2 Flow Rate: .O2 Flow Rate (L/min): 5 L/min  IO: Intake/output summary:   Intake/Output Summary (Last 24 hours) at 11/07/2017 1612 Last data filed at 11/07/2017 1400 Gross per 24 hour  Intake 2590.83 ml  Output 4650 ml  Net -2059.17 ml    LBM:   Baseline Weight: Weight: 80.3 kg (177 lb) Most recent weight: Weight: 80.3 kg (177 lb)     Palliative Assessment/Data:    Time Total: 55 min  Greater than 50%  of this time was spent counseling and coordinating care related to the above assessment and plan.  Signed by: Vinie Sill, NP Palliative Medicine Team Pager # [phone] (M-F 8a-5p) Team Phone # [phone] (Nights/Weekends)

## 2017-11-07 NOTE — NC FL2 (Signed)
[place] LEVEL OF CARE SCREENING TOOL     IDENTIFICATION  Patient Name: [name] Birthdate: [birth year] Sex: female Admission Date (Current Location): 11/05/2017  [facility] [place] Number:  Herbalist and Address:  [facility],  [address] [place], [place]      Provider Number: [id number]  Attending Physician Name and Address:  Cristal Ford, DO  Relative Name and Phone Number:  Royston Bake, [phone]    Current Level of Care: Hospital Recommended Level of Care: [place] Prior Approval Number:    Date Approved/Denied:   PASRR Number: [id number]  Discharge Plan: SNF    Current Diagnoses: Patient Active Problem List   Diagnosis Date Noted  . Acute blood loss anemia 11/06/2017  . Persistent vegetative state ([place]) 11/06/2017  . Occult blood in stools 11/06/2017  . HCAP (healthcare-associated pneumonia) 11/06/2017  . AKI (acute kidney injury) ([place])   . Metabolic acidemia   . Symptomatic anemia 10/08/2017  . Sepsis ([place]) 10/08/2017  . Acute bilat watershed infarction [facility])   . Acute respiratory failure with hypoxia ([place])   . Essential hypertension   . Diabetes mellitus type 2, uncontrolled, with complications ([place])   . Chronic diastolic CHF (congestive heart failure) ([place]) 08/30/2017  . Acute deep vein thrombosis (DVT) of right upper extremity ([place]) 08/30/2017  . Tracheostomy status ([place])   . Acute respiratory failure with hypoxemia ([place])   . Pressure injury of skin 08/04/2017  . Altered mental status   . H/O open leg wound 06/04/2017  . Open leg wound 06/04/2017  . Acute metabolic encephalopathy 02/20/4817  . Intracranial bleed ([place]) 05/26/2017  . ICH (intracerebral hemorrhage) ([place]) 05/26/2017  . Type II diabetes mellitus with renal manifestations ([place]) 05/25/2017  . SIRS (systemic inflammatory response syndrome) ([place]) 05/25/2017  . Aspiration pneumonia due to gastric secretions ([place])    . Confusion 05/10/2017  . Chronic hepatitis C without hepatic coma ([place])   . Labile blood glucose   . Hypoglycemia associated with type 2 diabetes mellitus ([place])   . Poorly controlled type 2 diabetes mellitus with peripheral neuropathy ([place])   . Neurologic gait disorder   . Neuropathic pain   . Type 2 diabetes mellitus with peripheral neuropathy (H[facility])   . History of intracranial hemorrhage   . Diarrhea   . Urinary incontinence   . Hypokalemia   . Polysubstance abuse ([place])   . Cerebrovascular accident (CVA) ([place])   . Disorientation   . Meningitis 11/02/2016  . Type 2 diabetes mellitus with hyperosmolar nonketotic hyperglycemia ([place]) 10/31/2016  . Seizure ([place]) 10/31/2016  . ARF (acute renal failure) ([place]) 10/31/2016  . Severe sepsis ([place]) 10/31/2016  . DKA (diabetic ketoacidoses) ([place]) 10/31/2016  . Acute renal failure superimposed on stage 3 chronic kidney disease ([place])   . Right hemiparesis ([place]) 06/09/2016  . 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 Cytotoxic brain edema ([place]) 06/08/2016  . Encephalopathy acute 06/06/2016  . Chest pain, musculoskeletal 05/16/2011  . Vaginal candidiasis 05/16/2011  . Diabetes mellitus, type 2 ([place]) 05/16/2011  . CHEST PAIN 10/01/2009  . CELLULITIS AND ABSCESS OF LEG EXCEPT FOOT 08/31/2009  . INSOMNIA 07/13/2008  . MRSA 04/17/2008  . NECK MASS 04/17/2008  . COCAINE ABUSE 11/01/2007  . OBESITY NOS 07/14/2006  . DENTAL CARIES 07/14/2006  . DIABETES MELLITUS, TYPE II 04/26/2006  . Alcohol abuse 04/26/2006  . TOBACCO ABUSE 04/26/2006  . DEPRESSION 04/26/2006  . Uncontrolled hypertension 04/26/2006  . GERD 04/26/2006  . LOW  BACK PAIN 04/26/2006    Orientation RESPIRATION BLADDER Height & Weight     (Responds to pain)  Tracheostomy Incontinent Weight: 177 lb (80.3 kg) Height:  5\' 6"  (167.6 cm)  BEHAVIORAL SYMPTOMS/MOOD NEUROLOGICAL BOWEL NUTRITION STATUS      Incontinent (Has PEG. See D/C Summary for nutrition instruction. )   AMBULATORY STATUS COMMUNICATION OF NEEDS Skin   Total Care                           Personal Care Assistance Level of Assistance  Bathing, Feeding, Dressing Bathing Assistance: Maximum assistance Feeding assistance: Maximum assistance Dressing Assistance: Maximum assistance     Functional Limitations Info  Sight, Hearing, Speech Sight Info: Adequate Hearing Info: Adequate Speech Info: Impaired    SPECIAL CARE FACTORS FREQUENCY  PT (By licensed PT), OT (By licensed OT)                    Contractures Contractures Info: Not present    Additional Factors Info  Code Status, Allergies Code Status Info: Fullcode  Allergies Info: No Known Allergies.            Current Medications (11/07/2017):  This is the current hospital active medication list Current Facility-Administered Medications  Medication Dose Route Frequency Provider Last Rate Last Dose  . acetaminophen (TYLENOL) solution 325 mg  325 mg Per Tube Q6H PRN Etta Quill, DO      . acetaminophen (TYLENOL) tablet 650 mg  650 mg Oral Q6H PRN Etta Quill, DO       Or  . acetaminophen (TYLENOL) suppository 650 mg  650 mg Rectal Q6H PRN Etta Quill, DO      . albuterol (PROVENTIL) (2.5 MG/3ML) 0.083% nebulizer solution 2.5 mg  2.5 mg Nebulization Q2H PRN Etta Quill, DO      . amLODipine (NORVASC) tablet 10 mg  10 mg Per Tube Daily Jennette Kettle M, DO   10 mg at 11/07/17 0908  . atorvastatin (LIPITOR) tablet 40 mg  40 mg Per Tube q1800 Etta Quill, DO   40 mg at 11/06/17 2303  . ceFEPIme (MAXIPIME) 1 g in sodium chloride 0.9 % 100 mL IVPB  1 g Intravenous Q12H [place]ade, Haze Justin, RPH   Stopped at 11/07/17 1409  . cloNIDine (CATAPRES) tablet 0.2 mg  0.2 mg Per Tube TID Etta Quill, DO   0.2 mg at 11/07/17 1536  . collagenase (SANTYL) ointment   Topical Daily Cristal Ford, DO      . dextrose 5 % solution   Intravenous Continuous Cristal Ford, DO 75 mL/hr at 11/07/17 0815 1,000 mL  at 11/07/17 0815  . docusate (COLACE) 50 MG/5ML liquid 50 mg  50 mg Per Tube BID PRN Etta Quill, DO      . feeding supplement (GLUCERNA 1.2 CAL) liquid 1,000 mL  1,000 mL Per Tube Continuous Mikhail, Maryann, DO      . feeding supplement (PRO-STAT SUGAR FREE 64) liquid 30 mL  30 mL Per Tube BID Mikhail, Velta Addison, DO   30 mL at 11/07/17 1540  . folic acid (FOLVITE) tablet 1 mg  1 mg Per Tube Daily Jennette Kettle M, DO   1 mg at 11/07/17 0908  . free water 200 mL  200 mL Per Tube Q6H Mikhail, Maryann, DO   200 mL at 11/07/17 1541  . hydrALAZINE (APRESOLINE) tablet 100 mg  100 mg Per Tube Q8H Jennette Kettle  M, DO   100 mg at 11/07/17 1537  . insulin aspart (novoLOG) injection 0-9 Units  0-9 Units Subcutaneous Q4H Jennette Kettle M, DO   7 Units at 11/07/17 1117  . labetalol (NORMODYNE) tablet 400 mg  400 mg Per Tube TID Etta Quill, DO   400 mg at 11/07/17 1537  . lacosamide (VIMPAT) tablet 200 mg  200 mg Per Tube BID Jennette Kettle M, DO   200 mg at 11/07/17 0907  . levETIRAcetam (KEPPRA) 100 MG/ML solution 1,000 mg  1,000 mg Per Tube BID Duffy Bruce, MD   1,000 mg at 11/07/17 0909  . multivitamin liquid 15 mL  15 mL Per Tube Daily Jennette Kettle M, DO   15 mL at 11/07/17 0906  . ondansetron (ZOFRAN) tablet 4 mg  4 mg Oral Q6H PRN Etta Quill, DO       Or  . ondansetron [facility]) injection 4 mg  4 mg Intravenous Q6H PRN Etta Quill, DO      . pantoprazole (PROTONIX) 80 mg in sodium chloride 0.9 % 250 mL (0.32 mg/mL) infusion  8 mg/hr Intravenous Continuous Duffy Bruce, MD 25 mL/hr at 11/07/17 1120 8 mg/hr at 11/07/17 1120  . [START ON 11/09/2017] pantoprazole (PROTONIX) injection 40 mg  40 mg Intravenous Q12H Duffy Bruce, MD      . phenytoin (DILANTIN) 125 MG/5ML suspension 250 mg  250 mg Oral BID Cristal Ford, DO   250 mg at 11/07/17 0906  . scopolamine (TRANSDERM-SCOP) 1 MG/3DAYS 1.5 mg  1 patch Transdermal Q72H Jennette Kettle M, DO   1.5 mg at 11/06/17 0321  .  thiamine (VITAMIN B-1) tablet 100 mg  100 mg Per Tube Daily Jennette Kettle M, DO   100 mg at 11/07/17 0908  . topiramate (TOPAMAX) tablet 200 mg  200 mg Per Tube BID Etta Quill, DO   200 mg at 11/07/17 0909     Discharge Medications: Please see discharge summary for a list of discharge medications.  Relevant Imaging Results:  Relevant Lab Results:   Additional Information [id number]. Patient had trach placed on 08/15/17  Lia Hopping, LCSW

## 2017-11-07 NOTE — Progress Notes (Signed)
PROGRESS NOTE    [name]  GUR:[id number] DOB: March 05, 1956 DOA: 11/05/2017 PCP: Nolene Ebbs, MD   Brief Narrative:  HPI on 11/06/2017 by Dr. Jennette Kettle  [name] is a 62 y.o. female with medical history significant of tobacco alcohol and cocaine use, CKD stage III, hyperlipidemia, hypertension, uncontrolled diabetes, history of right upper extremity DVT, history of hepatitis C. 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 Patient was most recently admitted from 4/1 to 4/11 for acute blood loss anemia and hemorrhagic shock.  Initially believed to be due to bleeding from trach site but ultimately determined to be due to abdominal wall hematoma in the R lower abdomen.  Coumadin held initially, patient transfused, and coumadin resumed on discharge. Today patient sent in to ED from SNF after serial hemoglobins (being performed since last admit) declined to 7.1 today. Patient is unable to provide any history herself due to being in persistent vegetative state.  Interim history Admitted for anemia and blood loss. Seen by gastroenterology- recommended conservative management, hold anticoagulation.  Assessment & Plan   Acute blood loss anemia/on chronic anemia, GI bleed -Baseline hemoglobin approximately 9-10, presented with a hemoglobin of 7.6 (was 7.1 prior to admission) -FOBT + -Patient transfused 2 units PRBC, hemoglobin currently 8.7 -CT abdomen pelvis showed no evidence of acute retroperitoneal hematoma.  3.4 cm resolving hematoma/seroma over right flank.  Mild patchy  right lower lobe opacities. -Gastroenterology consulted and appreciated, recommended avoiding anticoagulants if possible, transfuse as needed, supportive care, palliative care consultation  -Continue to monitor CBC  Acute kidney injury on chronic kidney disease, stage III with uremia -Creatinine upon admission 2.48, currently improved to 1.47 (baseline 1.2-1.4) -Continue IVF, and received [facility] -EDP discussed with Dr. Joelyn Oms, nephrologist, suggested IV fluids and transfusion, does not need to be transferred to [facility] at this time. -Monitor intake and output closely -Continue to monitor BMP  Hypernatremia -will change IVF to D5W and monitor BMP closely  RLL HCAP -CXR reviewed and shows possible area of consolidation right lung base -Blood and sputum cultures pending  -Strep pneumonia urine antigen negative -Continue vancomycin and cefepime  Diabetes mellitus, type II -PEG feeds held, will restart today (will consult nutrition) -Continue insulin sliding scale CBG monitoring  Essential hypertension -Continue amlodipine, clonidine, hydralazine, labetalol  Persistent vegetative state -Chronic  History of right upper extremity DVT -Coumadin held due to anemia (INR 2.44 on admission)  Decubitus ulcer, stage III -present on admission -Wound care consulted, recommended "Foam dressing to right posterior thigh to protect from moisture and absorb drainage.  Santyl for enzymatic debridement of nonviable tissue to sacrum and foam dressing to surrounding skin on buttocks."  Goals of care -Have consulted palliative care to discuss goals of care and code status -have left message with daughter   DVT Prophylaxis  SCDs  Code Status: Full  Family Communication: None at bedside. Voice mail left with daughter  Disposition Plan: Admitted. Pending improvement in sodium. Suspect SNF at discharge  Consultants Gastroenterology Nephrology  Palliative care  Procedures   none  Antibiotics   Anti-infectives (From admission, onward)   Start     Dose/Rate Route Frequency Ordered Stop   11/08/17  0600  vancomycin (VANCOCIN) IVPB 1000 mg/200 mL premix     1,000 mg 200 mL/hr over 60 Minutes Intravenous Every 48 hours 11/06/17 0514     11/06/17 2000  ceFEPIme (MAXIPIME) 1 g in sodium chloride 0.9 % 100 mL IVPB     1 g 200 mL/hr over 30 Minutes Intravenous Every 24 hours 11/06/17 0514     11/06/17 0015  vancomycin (VANCOCIN) 2,000 mg in sodium chloride 0.9 % 500 mL IVPB     2,000 mg 250 mL/hr over 120 Minutes Intravenous  Once 11/05/17 2349 11/06/17 0331   11/06/17 0000  ceFEPIme (MAXIPIME) 2 g in sodium chloride 0.9 % 100 mL IVPB     2 g 200 mL/hr over 30 Minutes Intravenous NOW 11/05/17 2349 11/06/17 0129      Subjective:   [name] seen and examined today.  Vegetative state.    Objective:   Vitals:   11/07/17 0908 11/07/17 0909 11/07/17 1000 11/07/17 1100  BP: 135/64 135/64 (!) 112/58   Pulse:  (!) 104 94 96  Resp:   (!) 28 (!) 28  Temp:      TempSrc:      SpO2:   98% 98%  Weight:      Height:        Intake/Output Summary (Last 24 hours) at 11/07/2017 1113 Last data filed at 11/07/2017 0900 Gross per 24 hour  Intake 1050 ml  Output 6600 ml  Net -5550 ml   Filed Weights   11/05/17 2049  Weight: 80.3 kg (177 lb)   Exam  General: Well developed, chronically ill appearing, NAD  HEENT: NCAT, mucous membranes moist.   Neck: Trach  Cardiovascular: S1 S2 auscultated, tachycardic   Respiratory: Diminished breath sounds, scattered rhonchi  Abdomen: Soft, nontender, nondistended, + bowel sounds, +peg  Extremities: warm dry without cyanosis clubbing or edema  Neuro: Awake, eyes opened, chronic vegetative state  Data Reviewed: I have personally reviewed following labs and imaging studies  CBC: Recent Labs  Lab 11/05/17 2200 11/06/17 0517 11/07/17 0304  WBC 18.6* 14.4* 16.1*  NEUTROABS 14.2*  --   --   HGB 7.1* 7.6* 8.7*   HCT 22.6* 23.7* 27.4*  MCV 95.0 93.7 92.9  PLT 255 172 785   Basic Metabolic Panel: Recent Labs  Lab 11/05/17 2200 11/06/17 0033 11/06/17 0517 11/07/17 0304  NA 144 146* 146* 156*  K 4.6 4.5 3.3* 3.7  CL 110 112* 122* 127*  CO2 20* 21* 14* 19*  GLUCOSE 214* 196* 222* 327*  BUN 298* >300* 234* 174*  CREATININE 2.48* 2.35* 1.66* 1.47*  CALCIUM 9.0 9.1 7.1* 9.1   GFR: Estimated Creatinine Clearance: 43.5 mL/min (A) (by C-G formula based on SCr of 1.47 mg/dL (H)). Liver Function Tests: No results for input(s): AST, ALT, ALKPHOS, BILITOT, PROT, ALBUMIN in the last 168 hours. No results for input(s): LIPASE, AMYLASE in the last 168 hours. No results for input(s): AMMONIA in the last 168 hours. Coagulation Profile: Recent Labs  Lab 11/05/17 2200  INR 2.44   Cardiac Enzymes: No results for input(s): CKTOTAL, CKMB, CKMBINDEX, TROPONINI in the last 168 hours. BNP (last 3 results) No results for input(s): PROBNP in the last 8760 hours. HbA1C: No results for input(s): HGBA1C in the last 72 hours. CBG: Recent Labs  Lab 11/06/17 1545 11/06/17 2222 11/07/17 0103 11/07/17 0300 11/07/17 0734  GLUCAP 312* 277* 341* 300* 215*   Lipid Profile: No results for input(s): CHOL, HDL, LDLCALC, TRIG, CHOLHDL, LDLDIRECT in the  last 72 hours. Thyroid Function Tests: No results for input(s): TSH, T4TOTAL, FREET4, T3FREE, THYROIDAB in the last 72 hours. Anemia Panel: No results for input(s): VITAMINB12, FOLATE, FERRITIN, TIBC, IRON, RETICCTPCT in the last 72 hours. Urine analysis:    Component Value Date/Time   COLORURINE YELLOW 10/08/2017 1143   APPEARANCEUR CLEAR 10/08/2017 1143   LABSPEC 1.012 10/08/2017 1143   PHURINE 7.0 10/08/2017 1143   GLUCOSEU NEGATIVE 10/08/2017 1143   GLUCOSEU > 1000 mg/dL (A) 11/01/2007 2056   HGBUR MODERATE (A) 10/08/2017 1143   HGBUR negative 07/15/2007 0954   BILIRUBINUR NEGATIVE 10/08/2017 1143   KETONESUR NEGATIVE 10/08/2017 1143   PROTEINUR 30  (A) 10/08/2017 1143   UROBILINOGEN 1.0 04/09/2012 1524   NITRITE NEGATIVE 10/08/2017 1143   LEUKOCYTESUR LARGE (A) 10/08/2017 1143   Sepsis Labs: @LABRCNTIP (procalcitonin:4,lacticidven:4)  ) Recent Results (from the past 240 hour(s))  Culture, respiratory (NON-Expectorated)     Status: None (Preliminary result)   Collection Time: 11/06/17  7:45 AM  Result Value Ref Range Status   Specimen Description   Final    TRACHEAL ASPIRATE Performed at [facility], [place] [address], [place], [place] [address]    Special Requests   Final    Normal Performed at [facility], [place] [address], [place], [place] [address]    Gram Stain   Final    RARE WBC PRESENT, PREDOMINANTLY PMN RARE GRAM POSITIVE COCCI    Culture   Final    CULTURE REINCUBATED FOR BETTER GROWTH Performed at [facility], [place] [address], [place], [place] [address]    Report Status PENDING  Incomplete  MRSA PCR Screening     Status: None   Collection Time: 11/06/17 11:17 PM  Result Value Ref Range Status   MRSA by PCR NEGATIVE NEGATIVE Final    Comment:        The GeneXpert MRSA Assay (FDA approved for NASAL specimens only), is one component of a comprehensive MRSA colonization surveillance program. It is not intended to diagnose MRSA infection nor to guide or monitor treatment for MRSA infections. Performed at [facility], [place] [address], [place],  [address]       Radiology Studies: Ct Abdomen Pelvis Wo Contrast  Result Date: 11/05/2017 CLINICAL DATA:  Anemia, abdominal distention EXAM: CT ABDOMEN AND PELVIS WITHOUT CONTRAST TECHNIQUE: Multidetector CT imaging of the abdomen and pelvis was performed following the standard protocol without IV contrast. COMPARISON:  10/08/2017 FINDINGS: Lower chest: Respiratory motion at the lung bases. Mild patchy right lower lobe opacities, favored to reflect a combination of atelectasis and mild pneumonia  anteriorly (series 4/image 16). Hepatobiliary: Unenhanced liver is grossly unremarkable. Gallbladder is unremarkable. No intrahepatic extrahepatic ductal dilatation. Pancreas: Within normal limits. Spleen: Within normal limits. Adrenals/Urinary Tract: Adrenal glands are within normal limits. 3 mm nonobstructing right lower pole renal calculus (series 2/image 40). Left kidney is within normal limits. No hydronephrosis. Bladder is within normal limits. Stomach/Bowel: Percutaneous gastrostomy along the anterior gastric antrum. No evidence of bowel obstruction. Normal appendix (series 2/image 56). Left colonic diverticulosis, without evidence of diverticulitis. Vascular/Lymphatic: No evidence of abdominal aortic aneurysm. Atherosclerotic calcifications of the abdominal aorta and branch vessels. No suspicious abdominopelvic lymphadenopathy. Reproductive: Uterus is within normal limits. No adnexal masses. Other: No abdominopelvic ascites. Musculoskeletal: 3.4 cm resolving hematoma/seroma overlying the right flank (series 2/image 64). Minimal subcutaneous stranding/resolving hematoma overlying the left lower abdomen. No evidence of acute retroperitoneal hematoma. Mild degenerative changes of the visualized  thoracolumbar spine. IMPRESSION: No evidence of acute retroperitoneal hematoma. 3.4 cm resolving hematoma/seroma overlying the right flank, improved. Mild patchy right lower lobe opacities, favored to reflect a combination of atelectasis and right lower lobe pneumonia. Additional ancillary findings as above. Electronically Signed   By: Julian Hy M.D.   On: 11/05/2017 23:20   Dg Chest Portable 1 View  Result Date: 11/05/2017 CLINICAL DATA:  Cough.  Low hemoglobin.  Tracheostomy patient. EXAM: PORTABLE CHEST 1 VIEW COMPARISON:  10/14/2017 FINDINGS: Tracheostomy tube remains in place. Shallow inspiration. Linear atelectasis or fibrosis in the left mid lung is unchanged since previous study. There is new area of  consolidation in the right lung base. This may indicate developing pneumonia or increasing atelectasis. No pleural effusions. No pneumothorax. Mediastinal contours appear intact. Heart size and pulmonary vascularity are normal. Calcification of the aorta. Degenerative changes in the spine and shoulders. IMPRESSION: New area of consolidation in the right lung base may represent developing pneumonia or increasing atelectasis. Persistent fibrosis or atelectasis in the left mid lung without change. Electronically Signed   By: Lucienne Capers M.D.   On: 11/05/2017 23:50     Scheduled Meds: . amLODipine  10 mg Per Tube Daily  . atorvastatin  40 mg Per Tube q1800  . cloNIDine  0.2 mg Per Tube TID  . collagenase   Topical Daily  . folic acid  1 mg Per Tube Daily  . hydrALAZINE  100 mg Per Tube Q8H  . insulin aspart  0-9 Units Subcutaneous Q4H  . labetalol  400 mg Per Tube TID  . lacosamide  200 mg Per Tube BID  . levETIRAcetam  1,000 mg Per Tube BID  . multivitamin  15 mL Per Tube Daily  . [START ON 11/09/2017] pantoprazole  40 mg Intravenous Q12H  . phenytoin  250 mg Oral BID  . scopolamine  1 patch Transdermal Q72H  . thiamine  100 mg Per Tube Daily  . topiramate  200 mg Per Tube BID   Continuous Infusions: . ceFEPime (MAXIPIME) IV Stopped (11/06/17 2153)  . dextrose 1,000 mL (11/07/17 0815)  . pantoprozole (PROTONIX) infusion 8 mg/hr (11/07/17 0600)  . [START ON 11/08/2017] vancomycin       LOS: 1 day   Time Spent in minutes   45 minutes (greater than 50% of time spent with patient face to face, as well as reviewing old records, calling consults, and formulating a plan)  Jaelen Soth D.O. on 11/07/2017 at 11:13 AM  Between 7am to 7pm - Pager - [phone]  After 7pm go to [email or web address] - password [email or web address]  And look for the night coverage person covering for me after hours  [facility] Office  [phone]

## 2017-11-07 NOTE — Progress Notes (Signed)
Initial Nutrition Assessment  DOCUMENTATION CODES:   Not applicable  INTERVENTION:  Will order Glucerna 1.2 @ 70 mL/hr x20 hours (due to Dilantin BID) with 30 mL Prostat BID and 200 mL free water QID. This regimen will provide 1880 kcal, 114 grams of protein, and 1927 mL free water.   NUTRITION DIAGNOSIS:   Increased nutrient needs related to wound healing as evidenced by estimated needs.  GOAL:   Patient will meet greater than or equal to 90% of their needs  MONITOR:   TF tolerance, Weight trends, Labs, Skin  REASON FOR ASSESSMENT:   Consult Enteral/tube feeding initiation and management  ASSESSMENT:   62 y.o. female with medical history significant of tobacco alcohol and cocaine use, CKD stage III, hyperlipidemia, hypertension, uncontrolled diabetes, history of right upper extremity DVT, history of hepatitis C.  BMI indicates overweight status. Pt is in a chronic vegetative state, unable to provide any information or interact. Pt has a trach and PEG. This RD is familiar with pt from previous admission at which time she was receiving Glucerna 1.2 @ 70 ml/hr x20 hours with 30 mL Prostat BID. This regimen provided 1880 kcal, 114 grams of protein, and 1127 mL free water.   NFPE outlined below. Per chart review, pt has lost 4 lbs (2.2% body weight) in the past 1.5 months. This is not significant for time frame.   Medications reviewed; 1 mg folic acid/day, sliding scale Novolog, 15 mL liquid multivitamin per PEG/day, 8 mg/hr Protonix, 40 mg IV Protonix BID, 250 mg Dilantin per PEG BID, 100 mg thiamine per PEG/day.  Labs reviewed; CBGs: 341, 300, 215, and 322 mg/dL today, Na: 156 mmol/L, Cl: 127 mmol/L, BUN: 174 mg/dL, creatinine: 1.47 mg/dL, GFR: 44 mL/min.   IVF: D5 @ 75 mL/hr (306 kcal).    NUTRITION - FOCUSED PHYSICAL EXAM:  Completed/assessed; no muscle and no fat wasting; no edema at this time.   Diet Order:   Diet Order    None      EDUCATION NEEDS:   No  education needs have been identified at this time  Skin:  Skin Assessment: Skin Integrity Issues: Skin Integrity Issues:: Incisions Unstageable: full thickness to coccyx Incisions: R hip (5/1)  Last BM:     Height:   Ht Readings from Last 1 Encounters:  11/05/17 5\' 6"  (1.676 m)    Weight:   Wt Readings from Last 1 Encounters:  11/05/17 177 lb (80.3 kg)    Ideal Body Weight:  59.09 kg  BMI:  Body mass index is 28.57 kg/m.  Estimated Nutritional Needs:   Kcal:  1850-2090  Protein:  105-120 grams  Fluid:  >/= 1.8 L/day      Jarome Matin, MS, RD, LDN, [facility] Inpatient Clinical Dietitian Pager # [phone] After hours/weekend pager # [phone]

## 2017-11-07 NOTE — Progress Notes (Addendum)
Inpatient Diabetes Program Recommendations  AACE/ADA: New Consensus Statement on Inpatient Glycemic Control (2015)  Target Ranges:  Prepandial:   less than 140 mg/dL      Peak postprandial:   less than 180 mg/dL (1-2 hours)      Critically ill patients:  140 - 180 mg/dL   Results for [name], [name] (MRN [id number]) as of 11/07/2017 11:18  Ref. Range 11/06/2017 05:20 11/06/2017 07:41 11/06/2017 12:29 11/06/2017 15:45 11/06/2017 22:22  Glucose-Capillary Latest Ref Range: 65 - 99 mg/dL 243 (H)  3 units NOVOLOG 257 (H)  5 units NOVOLOG 223 (H)  3 units NOVOLOG 312 (H)  7 units NOVOLOG 277 (H)   Results for [name], [name] (MRN [id number]) as of 11/07/2017 11:18  Ref. Range 11/07/2017 01:03 11/07/2017 03:00 11/07/2017 07:34 11/07/2017 11:14  Glucose-Capillary Latest Ref Range: 65 - 99 mg/dL 341 (H)  7 units NOVOLOG 300 (H)  5 units NOVOLOG 215 (H)  3 units NOVOLOG 322 (H)  7 units NOVOLOG      SNF DM Meds: Lantus 40 units BID      Humalog 2-10 units QID per SSI  Current Orders: Novolog Sensitive Correction Scale/ SSI (0-9 units) Q4 hours      Note patient with chronic Trach.  To start tube feedings today.  CBGs >250 mg/dl at present.    MD- Please consider the following in-hospital insulin adjustments:  1. Start Lantus 12 units BID (0.3 units/kg dosing based on weight of 80 kg).  Patient normally gets Lantus 40 units BID at SNF but may want to start with lower dose to see how patient responds.  2. Once tube feeds initiated, may also consider starting Novolog tube feed coverage.  Would not start until tube feeds are at goal rate.  Could start with Novolog 3 units Q4 hours (hold if tube feeds held for any reason)    --Will follow patient during hospitalization--  Wyn Quaker RN, MSN, CDE Diabetes Coordinator Inpatient Glycemic Control Team Team Pager: [phone] (8a-5p)

## 2017-11-08 ENCOUNTER — Inpatient Hospital Stay (HOSPITAL_COMMUNITY): Payer: Medicaid Other

## 2017-11-08 DIAGNOSIS — R403 Persistent vegetative state: Secondary | ICD-10-CM

## 2017-11-08 LAB — BASIC METABOLIC PANEL
Anion gap: 11 (ref 5–15)
BUN: 104 mg/dL — AB (ref 6–20)
CHLORIDE: 128 mmol/L — AB (ref 101–111)
CO2: 17 mmol/L — ABNORMAL LOW (ref 22–32)
Calcium: 9.1 mg/dL (ref 8.9–10.3)
Creatinine, Ser: 1.28 mg/dL — ABNORMAL HIGH (ref 0.44–1.00)
GFR calc Af Amer: 52 mL/min — ABNORMAL LOW (ref >60)
GFR calc non Af Amer: 45 mL/min — ABNORMAL LOW (ref >60)
Glucose, Bld: 425 mg/dL — ABNORMAL HIGH (ref 65–99)
POTASSIUM: 3.9 mmol/L (ref 3.5–5.1)
SODIUM: 156 mmol/L — AB (ref 135–145)

## 2017-11-08 LAB — URINALYSIS, ROUTINE W REFLEX MICROSCOPIC
Bilirubin Urine: NEGATIVE
Glucose, UA: >=500 mg/dL — AB
Hgb urine dipstick: NEGATIVE
KETONES UR: NEGATIVE mg/dL
Nitrite: NEGATIVE
PH: 6 (ref 5.0–8.0)
PROTEIN: 30 mg/dL — AB
Specific Gravity, Urine: 1.012 (ref 1.005–1.030)

## 2017-11-08 LAB — CULTURE, RESPIRATORY

## 2017-11-08 LAB — CBC
HCT: 28.2 % — ABNORMAL LOW (ref 36.0–46.0)
HEMOGLOBIN: 8.7 g/dL — AB (ref 12.0–15.0)
MCH: 29.1 pg (ref 26.0–34.0)
MCHC: 30.9 g/dL (ref 30.0–36.0)
MCV: 94.3 fL (ref 78.0–100.0)
Platelets: 212 10*3/uL (ref 150–400)
RBC: 2.99 MIL/uL — AB (ref 3.87–5.11)
RDW: 18.4 % — ABNORMAL HIGH (ref 11.5–15.5)
WBC: 16.5 10*3/uL — ABNORMAL HIGH (ref 4.0–10.5)

## 2017-11-08 LAB — MAGNESIUM
MAGNESIUM: 1.7 mg/dL (ref 1.7–2.4)
Magnesium: 1.5 mg/dL — ABNORMAL LOW (ref 1.7–2.4)

## 2017-11-08 LAB — GLUCOSE, CAPILLARY
GLUCOSE-CAPILLARY: 167 mg/dL — AB (ref 65–99)
GLUCOSE-CAPILLARY: 232 mg/dL — AB (ref 65–99)
GLUCOSE-CAPILLARY: 247 mg/dL — AB (ref 65–99)
GLUCOSE-CAPILLARY: 343 mg/dL — AB (ref 65–99)
GLUCOSE-CAPILLARY: 356 mg/dL — AB (ref 65–99)
Glucose-Capillary: 367 mg/dL — ABNORMAL HIGH (ref 65–99)
Glucose-Capillary: 413 mg/dL — ABNORMAL HIGH (ref 65–99)

## 2017-11-08 LAB — CULTURE, RESPIRATORY W GRAM STAIN
Culture: NORMAL
Special Requests: NORMAL

## 2017-11-08 LAB — PHOSPHORUS
PHOSPHORUS: 2.8 mg/dL (ref 2.5–4.6)
Phosphorus: 2.6 mg/dL (ref 2.5–4.6)

## 2017-11-08 MED ORDER — STERILE WATER FOR INJECTION IV SOLN
INTRAVENOUS | Status: DC
  Administered 2017-11-08 – 2017-11-14 (×11): via INTRAVENOUS
  Filled 2017-11-08 (×12): qty 850

## 2017-11-08 MED ORDER — INSULIN ASPART 100 UNIT/ML ~~LOC~~ SOLN
0.0000 [IU] | SUBCUTANEOUS | Status: DC
  Administered 2017-11-08: 5 [IU] via SUBCUTANEOUS
  Administered 2017-11-08: 15 [IU] via SUBCUTANEOUS
  Administered 2017-11-08: 3 [IU] via SUBCUTANEOUS
  Administered 2017-11-08: 5 [IU] via SUBCUTANEOUS
  Administered 2017-11-09 (×5): 3 [IU] via SUBCUTANEOUS
  Administered 2017-11-10: 5 [IU] via SUBCUTANEOUS
  Administered 2017-11-10 (×2): 2 [IU] via SUBCUTANEOUS
  Administered 2017-11-10 – 2017-11-11 (×2): 5 [IU] via SUBCUTANEOUS
  Administered 2017-11-11 (×2): 3 [IU] via SUBCUTANEOUS
  Administered 2017-11-11: 5 [IU] via SUBCUTANEOUS
  Administered 2017-11-11: 3 [IU] via SUBCUTANEOUS
  Administered 2017-11-12: 2 [IU] via SUBCUTANEOUS
  Administered 2017-11-12 (×2): 3 [IU] via SUBCUTANEOUS
  Administered 2017-11-13 (×2): 5 [IU] via SUBCUTANEOUS
  Administered 2017-11-13 – 2017-11-14 (×2): 3 [IU] via SUBCUTANEOUS
  Administered 2017-11-14: 2 [IU] via SUBCUTANEOUS
  Administered 2017-11-14 (×2): 3 [IU] via SUBCUTANEOUS
  Administered 2017-11-15: 2 [IU] via SUBCUTANEOUS
  Administered 2017-11-15: 3 [IU] via SUBCUTANEOUS
  Administered 2017-11-15 – 2017-11-16 (×3): 2 [IU] via SUBCUTANEOUS
  Administered 2017-11-16: 3 [IU] via SUBCUTANEOUS

## 2017-11-08 MED ORDER — INSULIN ASPART 100 UNIT/ML ~~LOC~~ SOLN
3.0000 [IU] | SUBCUTANEOUS | Status: DC
  Administered 2017-11-08 – 2017-11-16 (×36): 3 [IU] via SUBCUTANEOUS

## 2017-11-08 MED ORDER — INSULIN GLARGINE 100 UNIT/ML ~~LOC~~ SOLN
20.0000 [IU] | Freq: Two times a day (BID) | SUBCUTANEOUS | Status: DC
  Administered 2017-11-08 – 2017-11-10 (×5): 20 [IU] via SUBCUTANEOUS
  Filled 2017-11-08 (×6): qty 0.2

## 2017-11-08 NOTE — Progress Notes (Signed)
Palliative:  Ms. Botelho is same today. A little tachypneic and has had fever. Opens eyes but does not track or follow any commands.   I spoke with daughter, [name], via telephone with Dr. Ree Kida. Extensive discussion regarding assessment with poor neurological function, grim prognosis, and lack of QOL. Tiffany wants to come visit with her mother "in the next couple weeks." Requesting full code and continued aggressive care after extensive discussion on why this would not be helpful or recommended for her mother as we fear this would only add more suffering. We encouraged her to visit sooner than later. Would benefit from outpatient palliative follow up as Tiffany clearly not desiring any changes in plan of care at this time and requests to be updated "next week."   Palliative will follow at a distance. Please call for further decline to re-engage family discussions [phone].   36 min  Vinie Sill, NP Palliative Medicine Team Pager # [phone] (M-F 8a-5p) Team Phone # [phone] (Nights/Weekends)

## 2017-11-08 NOTE — Progress Notes (Signed)
PROGRESS NOTE    [name]  WRU:[id number] DOB: [birth year] DOA: 11/05/2017 PCP: Nolene Ebbs, MD   Brief Narrative:  HPI on 11/06/2017 by Dr. Jennette Kettle  [name] is a 62 y.o. female with medical history significant of tobacco alcohol and cocaine use, CKD stage III, hyperlipidemia, hypertension, uncontrolled diabetes, history of right upper extremity DVT, history of hepatitis C. 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 Patient was most recently admitted from 4/1 to 4/11 for acute blood loss anemia and hemorrhagic shock.  Initially believed to be due to bleeding from trach site but ultimately determined to be due to abdominal wall hematoma in the R lower abdomen.  Coumadin held initially, patient transfused, and coumadin resumed on discharge. Today patient sent in to ED from SNF after serial hemoglobins (being performed since last admit) declined to 7.1 today. Patient is unable to provide any history herself due to being in persistent vegetative state.  Interim history Admitted for anemia and blood loss. Seen by gastroenterology- recommended conservative management, hold anticoagulation.  Assessment & Plan   Acute blood loss anemia/on chronic anemia, GI bleed -Baseline hemoglobin approximately 9-10, presented with a hemoglobin of 7.6 (was 7.1 prior to admission) -FOBT + -Patient transfused 2 units PRBC, hemoglobin currently 8.7 (currently stable) -CT abdomen pelvis showed no evidence of acute retroperitoneal hematoma.  3.4 cm resolving hematoma/seroma over right  flank.  Mild patchy right lower lobe opacities. -Gastroenterology consulted and appreciated, recommended avoiding anticoagulants if possible, transfuse as needed, supportive care, palliative care consultation  -Given multiple coomorbidities and in a vegetative state, IVC filter would not be of benefit -Continue to monitor CBC  Acute kidney injury on chronic kidney disease, stage III with uremia -Resolved -Creatinine upon admission 2.48, currently improved to 1.28 (baseline 1.2-1.4) -Continue IVF, and received [facility] -EDP discussed with Dr. Joelyn Oms, nephrologist, suggested IV fluids and transfusion, does not need to be transferred to [facility] at this time. -Monitor intake and output closely -Continue to monitor BMP  Hypernatremia -Sodium continues to be elevated -was switched to D5W, however CBG uncontrolled -will place on bicarb/water drip and monitor   Nonanion gap metabolic acidosis -likely secondary to hypernatremia/hyperchloremia -will place on Bicarb drip/water and monitor  Sepsis secondary to RLL HCAP -Upon admission, CXR reviewed and shows possible area of consolidation right lung base -patient has now developed fever of 100.19F along with persistent leukocytosis -Blood cultures show no growth -Sputum culture shows rare WBC present (PMN), rare GPC, moderate culture consistent with normal respiratory flora -Strep pneumonia urine antigen negative -Vancomycin was discontinued on 11/07/2017, currently on cefepime -will obtain repeat CXR, blood cultures, UA/culture and continue to monitor   Diabetes mellitus, type II -CBGs have been uncontrolled- however, patient also was on D5w -peg feeds restarted -will increase lantus to 20u BID (takes 40uBID at SNF), and add on Novolog 3u q4hrs -continue ISS and CBG monitoring   Essential hypertension -Continue amlodipine, clonidine, hydralazine, labetalol  Persistent vegetative state -Chronic  History of right upper extremity  DVT -Coumadin held due to anemia (INR 2.44 on admission)  Decubitus ulcer, stage III -present on admission -Wound care consulted, recommended "Foam dressing to right posterior thigh to  protect from moisture and absorb drainage.  Santyl for enzymatic debridement of nonviable tissue to sacrum and foam dressing to surrounding skin on buttocks."  Goals of care -Have consulted palliative care to discuss goals of care and code status -Called daughter along with palliative care to discuss goals of care and CODE STATUS.  Patient's daughter very apprehensive regarding making decisions regarding her mother.  Would like to wait a few weeks until she is able to come see her mother.  This has been ongoing for several months, and no family has been to see the patient.  Patient has been hospitalized 5 times in the last 6 months.  Discussed about patient's current status will not improve, the brain damage that has been done as permanent.  Discussed that patient is not responsive to pain stimuli.  Discussed that patient could be in pain given her current vital signs, and that comfort measures would be recommended.  Daughter would like to obtain patient's medical records (although daughter asked for this information detailed in email, discussed that this is against HIPAA regulations and that she should go through medical records to obtain documentation regarding her mother). -Palliative care will follow the patient once discharged back to nursing facility and try to stay in contact with patient's daughter for updates. -Daughter requested patient remains full code at this time.  DVT Prophylaxis  SCDs  Code Status: Full  Family Communication: None at bedside. Daughter via phone  Disposition Plan: Admitted. Pending improvement in sodium. Suspect SNF at discharge  Consultants Gastroenterology Nephrology  Palliative care  Procedures  none  Antibiotics   Anti-infectives (From admission, onward)   Start      Dose/Rate Route Frequency Ordered Stop   11/08/17 0600  vancomycin (VANCOCIN) IVPB 1000 mg/200 mL premix  Status:  Discontinued     1,000 mg 200 mL/hr over 60 Minutes Intravenous Every 48 hours 11/06/17 0514 11/07/17 1343   11/07/17 1800  vancomycin (VANCOCIN) IVPB 1000 mg/200 mL premix  Status:  Discontinued     1,000 mg 200 mL/hr over 60 Minutes Intravenous Every 36 hours 11/07/17 1343 11/07/17 1423   11/07/17 1330  ceFEPIme (MAXIPIME) 1 g in sodium chloride 0.9 % 100 mL IVPB     1 g 200 mL/hr over 30 Minutes Intravenous Every 12 hours 11/07/17 1320     11/06/17 2000  ceFEPIme (MAXIPIME) 1 g in sodium chloride 0.9 % 100 mL IVPB  Status:  Discontinued     1 g 200 mL/hr over 30 Minutes Intravenous Every 24 hours 11/06/17 0514 11/07/17 1320   11/06/17 0015  vancomycin (VANCOCIN) 2,000 mg in sodium chloride 0.9 % 500 mL IVPB     2,000 mg 250 mL/hr over 120 Minutes Intravenous  Once 11/05/17 2349 11/06/17 0331   11/06/17 0000  ceFEPIme (MAXIPIME) 2 g in sodium chloride 0.9 % 100 mL IVPB     2 g 200 mL/hr over 30 Minutes Intravenous NOW 11/05/17 2349 11/06/17 0129      Subjective:   [name] seen and examined today.  Vegetative state.    Objective:   Vitals:   11/08/17 0800 11/08/17 0838 11/08/17 0840 11/08/17 1000  BP: (!) 205/77   (!) 153/65  Pulse: (!) 109 (!) 109  97  Resp: (!) 37 17  (!) 22  Temp:      TempSrc:      SpO2: 98% 100% 100% 100%  Weight:      Height:        Intake/Output Summary (  Last 24 hours) at 11/08/2017 1025 Last data filed at 11/08/2017 1013 Gross per 24 hour  Intake 5585 ml  Output 3675 ml  Net 1910 ml   Filed Weights   11/05/17 2049 11/08/17 0600  Weight: 80.3 kg (177 lb) 77.4 kg (170 lb 10.2 oz)   Exam  General: Well developed, chronically ill appearing NAD  HEENT: NCAT, mucous membranes moist.   Neck: Trach  Cardiovascular: S1 S2 auscultated, tachycardic  Respiratory: Diminished breath sounds  Abdomen: Soft, nontender,  nondistended, + bowel sounds,+peg  Extremities: warm dry without cyanosis clubbing or edema  Neuro: Awake, however chronic vegetative state.  No response secondary to tactile or verbal stimuli  Data Reviewed: I have personally reviewed following labs and imaging studies  CBC: Recent Labs  Lab 11/05/17 2200 11/06/17 0517 11/07/17 0304 11/08/17 0603  WBC 18.6* 14.4* 16.1* 16.5*  NEUTROABS 14.2*  --   --   --   HGB 7.1* 7.6* 8.7* 8.7*  HCT 22.6* 23.7* 27.4* 28.2*  MCV 95.0 93.7 92.9 94.3  PLT 255 172 209 347   Basic Metabolic Panel: Recent Labs  Lab 11/05/17 2200 11/06/17 0033 11/06/17 0517 11/07/17 0304 11/07/17 1430 11/07/17 1650 11/08/17 0603  NA 144 146* 146* 156*  --   --  156*  K 4.6 4.5 3.3* 3.7  --   --  3.9  CL 110 112* 122* 127*  --   --  128*  CO2 20* 21* 14* 19*  --   --  17*  GLUCOSE 214* 196* 222* 327*  --   --  425*  BUN 298* >300* 234* 174*  --   --  104*  CREATININE 2.48* 2.35* 1.66* 1.47*  --   --  1.28*  CALCIUM 9.0 9.1 7.1* 9.1  --   --  9.1  MG  --   --   --   --  2.0 2.0 1.7  PHOS  --   --   --   --  3.3 3.4 2.6   GFR: Estimated Creatinine Clearance: 49.1 mL/min (A) (by C-G formula based on SCr of 1.28 mg/dL (H)). Liver Function Tests: No results for input(s): AST, ALT, ALKPHOS, BILITOT, PROT, ALBUMIN in the last 168 hours. No results for input(s): LIPASE, AMYLASE in the last 168 hours. No results for input(s): AMMONIA in the last 168 hours. Coagulation Profile: Recent Labs  Lab 11/05/17 2200  INR 2.44   Cardiac Enzymes: No results for input(s): CKTOTAL, CKMB, CKMBINDEX, TROPONINI in the last 168 hours. BNP (last 3 results) No results for input(s): PROBNP in the last 8760 hours. HbA1C: No results for input(s): HGBA1C in the last 72 hours. CBG: Recent Labs  Lab 11/07/17 1612 11/07/17 2000 11/08/17 0036 11/08/17 0325 11/08/17 0730  GLUCAP 298* 363* 356* 343* 367*   Lipid Profile: No results for input(s): CHOL, HDL, LDLCALC,  TRIG, CHOLHDL, LDLDIRECT in the last 72 hours. Thyroid Function Tests: No results for input(s): TSH, T4TOTAL, FREET4, T3FREE, THYROIDAB in the last 72 hours. Anemia Panel: No results for input(s): VITAMINB12, FOLATE, FERRITIN, TIBC, IRON, RETICCTPCT in the last 72 hours. Urine analysis:    Component Value Date/Time   COLORURINE YELLOW 10/08/2017 [place] 10/08/2017 1143   LABSPEC 1.012 10/08/2017 1143   PHURINE 7.0 10/08/2017 1143   GLUCOSEU NEGATIVE 10/08/2017 1143   GLUCOSEU > 1000 mg/dL (A) 11/01/2007 2056   HGBUR MODERATE (A) 10/08/2017 1143   HGBUR negative 07/15/2007 [place] 10/08/2017 1143  KETONESUR NEGATIVE 10/08/2017 1143   PROTEINUR 30 (A) 10/08/2017 1143   UROBILINOGEN 1.0 04/09/2012 1524   NITRITE NEGATIVE 10/08/2017 1143   LEUKOCYTESUR LARGE (A) 10/08/2017 1143   Sepsis Labs: @LABRCNTIP (procalcitonin:4,lacticidven:4)  ) Recent Results (from the past 240 hour(s))  Blood culture (routine x 2)     Status: None (Preliminary result)   Collection Time: 11/06/17 12:14 AM  Result Value Ref Range Status   Specimen Description   Final    BLOOD LEFT ANTECUBITAL Performed at [facility] [address] [place], [place] [address]    Special Requests   Final    BOTTLES DRAWN AEROBIC AND ANAEROBIC Blood Culture adequate volume Performed at [facility] [address], [place], [place] [address]    Culture   Final    NO GROWTH 1 DAY Performed at [facility], [place] [address], [place], [place] [address]    Report Status PENDING  Incomplete  Blood culture (routine x 2)     Status: None (Preliminary result)   Collection Time: 11/06/17 12:33 AM  Result Value Ref Range Status   Specimen Description   Final    BLOOD RIGHT FOREARM Performed at [facility] [address] [place], [place] [address]    Special Requests   Final    BOTTLES DRAWN AEROBIC AND ANAEROBIC Blood  Culture adequate volume Performed at [facility] [address], [place], [place] [address]    Culture   Final    NO GROWTH 1 DAY Performed at [facility], [place] [address] [place], [place] [address]    Report Status PENDING  Incomplete  Culture, respiratory (NON-Expectorated)     Status: None   Collection Time: 11/06/17  7:45 AM  Result Value Ref Range Status   Specimen Description   Final    TRACHEAL ASPIRATE Performed at [facility] [address], [place], [place] [address]    Special Requests   Final    Normal Performed at [facility], [place] [address], [place], [place] [address]    Gram Stain   Final    RARE WBC PRESENT, PREDOMINANTLY PMN RARE GRAM POSITIVE COCCI    Culture   Final    MODERATE Consistent with normal respiratory flora. Performed at [facility], [place] [address], [place], [place] [address]    Report Status 11/08/2017 FINAL  Final  MRSA PCR Screening     Status: None   Collection Time: 11/06/17 11:17 PM  Result Value Ref Range Status   MRSA by PCR NEGATIVE NEGATIVE Final    Comment:        The GeneXpert MRSA Assay (FDA approved for NASAL specimens only), is one component of a comprehensive MRSA colonization surveillance program. It is not intended to diagnose MRSA infection nor to guide or monitor treatment for MRSA infections. Performed at [facility], [place] [address] [place], [place] [address]       Radiology Studies: No results found.   Scheduled Meds: . amLODipine  10 mg Per Tube Daily  . atorvastatin  40 mg Per Tube q1800  . cloNIDine  0.2 mg Per Tube TID  . collagenase   Topical Daily  . feeding supplement (PRO-STAT SUGAR FREE 64)  30 mL Per Tube BID  . folic acid  1 mg Per Tube Daily  . free water  200 mL Per Tube Q6H  . hydrALAZINE  100 mg Per  Tube Q8H  . insulin aspart  0-9 Units Subcutaneous Q4H  . insulin aspart  3 Units Subcutaneous  Q4H  . insulin glargine  20 Units Subcutaneous BID  . labetalol  400 mg Per Tube TID  . lacosamide  200 mg Per Tube BID  . levETIRAcetam  1,000 mg Per Tube BID  . multivitamin  15 mL Per Tube Daily  . [START ON 11/09/2017] pantoprazole  40 mg Intravenous Q12H  . phenytoin  250 mg Oral BID  . scopolamine  1 patch Transdermal Q72H  . thiamine  100 mg Per Tube Daily  . topiramate  200 mg Per Tube BID   Continuous Infusions: . ceFEPime (MAXIPIME) IV Stopped (11/08/17 1013)  . dextrose 75 mL/hr at 11/08/17 0600  . feeding supplement (GLUCERNA 1.2 CAL) Stopped (11/08/17 0800)  . pantoprozole (PROTONIX) infusion 8 mg/hr (11/08/17 0614)     LOS: 2 days   Time Spent in minutes   45 minutes (greater than 50% of time spent with patient face to face, reviewing patient with daughter via phone, discussing goals of care/code status, formulating plan)   Cristal Ford D.O. on 11/08/2017 at 10:25 AM  Between 7am to 7pm - Pager - [phone]  After 7pm go to [email or web address] - password [email or web address]  And look for the night coverage person covering for me after hours  [facility] Office  [phone]

## 2017-11-09 LAB — GLUCOSE, CAPILLARY
GLUCOSE-CAPILLARY: 188 mg/dL — AB (ref 65–99)
GLUCOSE-CAPILLARY: 188 mg/dL — AB (ref 65–99)
Glucose-Capillary: 177 mg/dL — ABNORMAL HIGH (ref 65–99)
Glucose-Capillary: 143 mg/dL — ABNORMAL HIGH (ref 65–99)

## 2017-11-09 LAB — TYPE AND SCREEN
ABO/RH(D): O POS
ANTIBODY SCREEN: NEGATIVE
UNIT DIVISION: 0
UNIT DIVISION: 0
Unit division: 0
Unit division: 0

## 2017-11-09 LAB — BPAM RBC
BLOOD PRODUCT EXPIRATION DATE: 201905232359
BLOOD PRODUCT EXPIRATION DATE: 201905232359
BLOOD PRODUCT EXPIRATION DATE: 201905232359
Blood Product Expiration Date: 201905232359
ISSUE DATE / TIME: 201904300122
ISSUE DATE / TIME: 201904301034
UNIT TYPE AND RH: 5100
Unit Type and Rh: 5100
Unit Type and Rh: 5100
Unit Type and Rh: 5100

## 2017-11-09 LAB — BASIC METABOLIC PANEL
ANION GAP: 11 (ref 5–15)
BUN: 80 mg/dL — AB (ref 6–20)
CALCIUM: 8.9 mg/dL (ref 8.9–10.3)
CO2: 21 mmol/L — ABNORMAL LOW (ref 22–32)
Chloride: 128 mmol/L — ABNORMAL HIGH (ref 101–111)
Creatinine, Ser: 1.08 mg/dL — ABNORMAL HIGH (ref 0.44–1.00)
GFR calc Af Amer: >60 mL/min (ref >60)
GFR calc non Af Amer: 55 mL/min — ABNORMAL LOW (ref >60)
GLUCOSE: 204 mg/dL — AB (ref 65–99)
POTASSIUM: 3.9 mmol/L (ref 3.5–5.1)
Sodium: 160 mmol/L — ABNORMAL HIGH (ref 135–145)

## 2017-11-09 LAB — CBC
HEMATOCRIT: 27.2 % — AB (ref 36.0–46.0)
Hemoglobin: 8.5 g/dL — ABNORMAL LOW (ref 12.0–15.0)
MCH: 29.6 pg (ref 26.0–34.0)
MCHC: 31.3 g/dL (ref 30.0–36.0)
MCV: 94.8 fL (ref 78.0–100.0)
Platelets: 188 10*3/uL (ref 150–400)
RBC: 2.87 MIL/uL — ABNORMAL LOW (ref 3.87–5.11)
RDW: 18.5 % — AB (ref 11.5–15.5)
WBC: 18.4 10*3/uL — AB (ref 4.0–10.5)

## 2017-11-09 LAB — URINE CULTURE: Culture: NO GROWTH

## 2017-11-09 MED ORDER — FREE WATER
400.0000 mL | Freq: Four times a day (QID) | Status: DC
  Administered 2017-11-09 – 2017-11-16 (×29): 400 mL

## 2017-11-09 MED ORDER — MAGNESIUM SULFATE 2 GM/50ML IV SOLN
2.0000 g | Freq: Once | INTRAVENOUS | Status: AC
  Administered 2017-11-09: 2 g via INTRAVENOUS
  Filled 2017-11-09: qty 50

## 2017-11-09 MED ORDER — SODIUM CHLORIDE 0.9 % IV SOLN
1.0000 g | Freq: Three times a day (TID) | INTRAVENOUS | Status: DC
  Administered 2017-11-09 – 2017-11-13 (×12): 1 g via INTRAVENOUS
  Filled 2017-11-09 (×13): qty 1

## 2017-11-09 MED ORDER — VANCOMYCIN HCL 10 G IV SOLR
1250.0000 mg | INTRAVENOUS | Status: DC
  Administered 2017-11-09 – 2017-11-11 (×3): 1250 mg via INTRAVENOUS
  Filled 2017-11-09 (×4): qty 1250

## 2017-11-09 NOTE — Care Management Note (Signed)
Case Management Note  Patient Details  Name: [name] MRN: [id number] Date of Birth: 1956/02/20  Subjective/Objective:                  Admitted for poss rll pna and anemia hgb 7.1 on admit 8.4 after two units of blood/ elevated wbc/ na bun and creat.  Action/Plan: Date: Nov 09, 2017 Velva Harman, BSN, [place], [place] Chart and notes review for patient progress and needs. Will follow for case management and discharge needs.  [facility] after icb 2017 No cm or discharge needs present at time of this review. Next review date: [id number]  Expected Discharge Date:  (UNKOWN)               Expected Discharge Plan:  [place]  In-House Referral:  Clinical Social Work  Discharge planning Services  CM Consult  Post Acute Care Choice:    Choice offered to:     DME Arranged:    DME Agency:     HH Arranged:    [place] Agency:     Status of Service:  In process, will continue to follow  If discussed at Long Length of Stay Meetings, dates discussed:    Additional Comments:  Leeroy Cha, RN 11/09/2017, 8:59 AM

## 2017-11-09 NOTE — Plan of Care (Signed)
  Problem: Nutrition: Goal: Adequate nutrition will be maintained Outcome: Progressing   Problem: Safety: Goal: Ability to remain free from injury will improve Outcome: Progressing   Problem: Elimination: Goal: Will not experience complications related to bowel motility Outcome: Progressing   

## 2017-11-09 NOTE — Progress Notes (Signed)
PROGRESS NOTE    [name]  EQA:[id number] DOB: 12/14/1955 DOA: 11/05/2017 PCP: Nolene Ebbs, MD   Brief Narrative:  HPI on 11/06/2017 by Dr. Jennette Kettle  [name] is a 62 y.o. female with medical history significant of tobacco alcohol and cocaine use, CKD stage III, hyperlipidemia, hypertension, uncontrolled diabetes, history of right upper extremity DVT, history of hepatitis C. 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 Patient was most recently admitted from 4/1 to 4/11 for acute blood loss anemia and hemorrhagic shock.  Initially believed to be due to bleeding from trach site but ultimately determined to be due to abdominal wall hematoma in the R lower abdomen.  Coumadin held initially, patient transfused, and coumadin resumed on discharge. Today patient sent in to ED from SNF after serial hemoglobins (being performed since last admit) declined to 7.1 today. Patient is unable to provide any history herself due to being in persistent vegetative state.  Interim history Admitted for anemia and blood loss. Seen by gastroenterology- recommended conservative management, hold anticoagulation.  Assessment & Plan   Acute blood loss anemia/on chronic anemia, GI bleed -Baseline hemoglobin approximately 9-10, presented with a hemoglobin of 7.6 (was 7.1 prior to admission) -FOBT + -Patient transfused 2 units PRBC, hemoglobin currently 8.5 (currently stable) -CT abdomen pelvis showed no evidence of acute retroperitoneal hematoma.  3.4 cm resolving hematoma/seroma over right  flank.  Mild patchy right lower lobe opacities. -Gastroenterology consulted and appreciated, recommended avoiding anticoagulants if possible, transfuse as needed, supportive care, palliative care consultation  -Given multiple coomorbidities and in a vegetative state, IVC filter would not be of benefit -Continue to monitor CBC  Acute kidney injury on chronic kidney disease, stage III with uremia -Resolved -Creatinine upon admission 2.48, currently improved to 1.08 (baseline 1.2-1.4) -Continue IVF, and received [facility] -EDP discussed with Dr. Joelyn Oms, nephrologist, suggested IV fluids and transfusion, does not need to be transferred to [facility] at this time. -Monitor intake and output closely -Continue to monitor BMP  Hypernatremia -Sodium continues to be elevated despite changing IV fluids -Sodium today 160 -Will increase free water flushes to 400 mL q6hr -continue to monitor BMP  Nonanion gap metabolic acidosis -likely secondary to hypernatremia/hyperchloremia -bicarb improving, continue to monitor   Sepsis secondary to RLL HCAP -Upon admission, CXR reviewed and shows possible area of consolidation right lung base -patient has now developed fever of 100.24F along with persistent leukocytosis -Blood cultures show no growth -Sputum culture shows rare WBC present (PMN), rare GPC, moderate culture consistent with normal respiratory flora -Strep pneumonia urine antigen negative -Vancomycin was discontinued on 11/07/2017, currently on cefepime -Given increased leukocytosis and fever, will restart vancomycin -Obtain repeat chest x-ray which did show improvement; unremarkable for infection -UA unremarkable for infection however patient has been on antibiotics.  Upon recent admission, she was treated for an E. coli as well as Klebsiella pneumonia UTI -Pending urine culture as well as repeat blood cultures  Hypomagnesemia -Will replace and continue to monitor  Diabetes mellitus, type  II -CBGs have been uncontrolled- however, patient also was on D5w -peg feeds restarted -Will increase Lantus to 24 units twice daily, patient was taking 40 units twice daily  prior to admission -Increased insulin sliding scale to moderate -Continue NovoLog 3 units every 4 hours -Continue to monitor CBGs  Essential hypertension -Continue amlodipine, clonidine, hydralazine, labetalol  Persistent vegetative state -Chronic  History of right upper extremity DVT -Coumadin held due to anemia (INR 2.44 on admission)  Decubitus ulcer, stage III -present on admission -Wound care consulted, recommended "Foam dressing to right posterior thigh to protect from moisture and absorb drainage.  Santyl for enzymatic debridement of nonviable tissue to sacrum and foam dressing to surrounding skin on buttocks."  Goals of care -Have consulted palliative care to discuss goals of care and code status -Called daughter along with palliative care to discuss goals of care and CODE STATUS.  Patient's daughter very apprehensive regarding making decisions regarding her mother.  Would like to wait a few weeks until she is able to come see her mother.  This has been ongoing for several months, and no family has been to see the patient.  Patient has been hospitalized 5 times in the last 6 months.  Discussed about patient's current status will not improve, the brain damage that has been done as permanent.  Discussed that patient is not responsive to pain stimuli.  Discussed that patient could be in pain given her current vital signs, and that comfort measures would be recommended.  Daughter would like to obtain patient's medical records (although daughter asked for this information detailed in email, discussed that this is against HIPAA regulations and that she should go through medical records to obtain documentation regarding her mother). -Palliative care will follow the patient once discharged back to nursing facility and try  to stay in contact with patient's daughter for updates. -Daughter requested patient remains full code at this time.  DVT Prophylaxis  SCDs  Code Status: Full  Family Communication: None at bedside.  Disposition Plan: Admitted. Pending improvement in sodium. Suspect SNF at discharge  Consultants Gastroenterology Nephrology  Palliative care  Procedures  none  Antibiotics   Anti-infectives (From admission, onward)   Start     Dose/Rate Route Frequency Ordered Stop   11/08/17 0600  vancomycin (VANCOCIN) IVPB 1000 mg/200 mL premix  Status:  Discontinued     1,000 mg 200 mL/hr over 60 Minutes Intravenous Every 48 hours 11/06/17 0514 11/07/17 1343   11/07/17 1800  vancomycin (VANCOCIN) IVPB 1000 mg/200 mL premix  Status:  Discontinued     1,000 mg 200 mL/hr over 60 Minutes Intravenous Every 36 hours 11/07/17 1343 11/07/17 1423   11/07/17 1330  ceFEPIme (MAXIPIME) 1 g in sodium chloride 0.9 % 100 mL IVPB     1 g 200 mL/hr over 30 Minutes Intravenous Every 12 hours 11/07/17 1320     11/06/17 2000  ceFEPIme (MAXIPIME) 1 g in sodium chloride 0.9 % 100 mL IVPB  Status:  Discontinued     1 g 200 mL/hr over 30 Minutes Intravenous Every 24 hours 11/06/17 0514 11/07/17 1320   11/06/17 0015  vancomycin (VANCOCIN) 2,000 mg in sodium chloride 0.9 % 500 mL IVPB     2,000 mg 250 mL/hr over 120 Minutes Intravenous  Once 11/05/17 2349 11/06/17 0331   11/06/17 0000  ceFEPIme (MAXIPIME) 2 g in sodium chloride 0.9 % 100 mL IVPB     2 g 200 mL/hr over 30 Minutes Intravenous NOW 11/05/17 2349 11/06/17 0129      Subjective:   Janneth Celona seen and examined today.  Vegetative state.    Objective:   Vitals:   11/09/17 0600  11/09/17 0800 11/09/17 0844 11/09/17 1056  BP: (!) 163/79  (!) 165/78 138/61  Pulse: 93  97 89  Resp: (!) 24  (!) 22   Temp:  (!) 100.4 F (38 C)    TempSrc:  Axillary    SpO2: 99%  99%   Weight:      Height:        Intake/Output Summary (Last 24 hours) at  11/09/2017 1101 Last data filed at 11/09/2017 0700 Gross per 24 hour  Intake 3949.17 ml  Output 2200 ml  Net 1749.17 ml   Filed Weights   11/05/17 2049 11/08/17 0600 11/09/17 0500  Weight: 80.3 kg (177 lb) 77.4 kg (170 lb 10.2 oz) 76.9 kg (169 lb 8.5 oz)   Exam  General: Well developed, chronically ill-appearing, no apparent distress  HEENT: NCAT, mucous membranes moist.   Neck: Trach  Cardiovascular: S1 S2 auscultated, RRR  Respiratory: Diminished breath sounds, no wheezing  Abdomen: Soft, nontender, nondistended, + bowel sounds,+ PEG  Extremities: warm dry without cyanosis clubbing or edema  Neuro: Chronic vegetative state, opens eyes occasionally  Psych: cannot assess due to current state  Data Reviewed: I have personally reviewed following labs and imaging studies  CBC: Recent Labs  Lab 11/05/17 2200 11/06/17 0517 11/07/17 0304 11/08/17 0603 11/09/17 0319  WBC 18.6* 14.4* 16.1* 16.5* 18.4*  NEUTROABS 14.2*  --   --   --   --   HGB 7.1* 7.6* 8.7* 8.7* 8.5*  HCT 22.6* 23.7* 27.4* 28.2* 27.2*  MCV 95.0 93.7 92.9 94.3 94.8  PLT 255 172 209 212 253   Basic Metabolic Panel: Recent Labs  Lab 11/06/17 0033 11/06/17 0517 11/07/17 0304 11/07/17 1430 11/07/17 1650 11/08/17 0603 11/08/17 1650 11/09/17 0319  NA 146* 146* 156*  --   --  156*  --  160*  K 4.5 3.3* 3.7  --   --  3.9  --  3.9  CL 112* 122* 127*  --   --  128*  --  128*  CO2 21* 14* 19*  --   --  17*  --  21*  GLUCOSE 196* 222* 327*  --   --  425*  --  204*  BUN >300* 234* 174*  --   --  104*  --  80*  CREATININE 2.35* 1.66* 1.47*  --   --  1.28*  --  1.08*  CALCIUM 9.1 7.1* 9.1  --   --  9.1  --  8.9  MG  --   --   --  2.0 2.0 1.7 1.5*  --   PHOS  --   --   --  3.3 3.4 2.6 2.8  --    GFR: Estimated Creatinine Clearance: 58 mL/min (A) (by C-G formula based on SCr of 1.08 mg/dL (H)). Liver Function Tests: No results for input(s): AST, ALT, ALKPHOS, BILITOT, PROT, ALBUMIN in the last 168  hours. No results for input(s): LIPASE, AMYLASE in the last 168 hours. No results for input(s): AMMONIA in the last 168 hours. Coagulation Profile: Recent Labs  Lab 11/05/17 2200  INR 2.44   Cardiac Enzymes: No results for input(s): CKTOTAL, CKMB, CKMBINDEX, TROPONINI in the last 168 hours. BNP (last 3 results) No results for input(s): PROBNP in the last 8760 hours. HbA1C: No results for input(s): HGBA1C in the last 72 hours. CBG: Recent Labs  Lab 11/08/17 1131 11/08/17 1504 11/08/17 1922 11/08/17 2306 11/09/17 0329  GLUCAP 413* 232* 167* 247* 188*   Lipid Profile:  No results for input(s): CHOL, HDL, LDLCALC, TRIG, CHOLHDL, LDLDIRECT in the last 72 hours. Thyroid Function Tests: No results for input(s): TSH, T4TOTAL, FREET4, T3FREE, THYROIDAB in the last 72 hours. Anemia Panel: No results for input(s): VITAMINB12, FOLATE, FERRITIN, TIBC, IRON, RETICCTPCT in the last 72 hours. Urine analysis:    Component Value Date/Time   COLORURINE YELLOW 11/08/2017 [place] 11/08/2017 1046   LABSPEC 1.012 11/08/2017 1046   PHURINE 6.0 11/08/2017 1046   GLUCOSEU >=500 (A) 11/08/2017 1046   GLUCOSEU > 1000 mg/dL (A) 11/01/2007 2056   HGBUR NEGATIVE 11/08/2017 1046   HGBUR negative 07/15/2007 0954   BILIRUBINUR NEGATIVE 11/08/2017 1046   KETONESUR NEGATIVE 11/08/2017 1046   PROTEINUR 30 (A) 11/08/2017 1046   UROBILINOGEN 1.0 04/09/2012 1524   NITRITE NEGATIVE 11/08/2017 1046   LEUKOCYTESUR TRACE (A) 11/08/2017 1046   Sepsis Labs: @LABRCNTIP (procalcitonin:4,lacticidven:4)  ) Recent Results (from the past 240 hour(s))  Blood culture (routine x 2)     Status: None (Preliminary result)   Collection Time: 11/06/17 12:14 AM  Result Value Ref Range Status   Specimen Description   Final    BLOOD LEFT ANTECUBITAL Performed at [facility], [place] [address], [place], [place] [address]    Special Requests   Final    BOTTLES DRAWN AEROBIC AND  ANAEROBIC Blood Culture adequate volume Performed at [place] [address] [place], [place] [address]    Culture   Final    NO GROWTH 2 DAYS Performed at [place] [address] [place], [place] [address]    Report Status PENDING  Incomplete  Blood culture (routine x 2)     Status: None (Preliminary result)   Collection Time: 11/06/17 12:33 AM  Result Value Ref Range Status   Specimen Description   Final    BLOOD RIGHT FOREARM Performed at [place] [address], [place], [place] [address]    Special Requests   Final    BOTTLES DRAWN AEROBIC AND ANAEROBIC Blood Culture adequate volume Performed at [place] [address] [place], [place] [address]    Culture   Final    NO GROWTH 2 DAYS Performed at [place] [address] [place], [place] [address]    Report Status PENDING  Incomplete  Culture, respiratory (NON-Expectorated)     Status: None   Collection Time: 11/06/17  7:45 AM  Result Value Ref Range Status   Specimen Description   Final    TRACHEAL ASPIRATE Performed at [place] [address], [place], [name] [address]    Special Requests   Final    Normal Performed at [facility], [place] [address], [place], [place] [address]    Gram Stain   Final    RARE WBC PRESENT, PREDOMINANTLY PMN RARE GRAM POSITIVE COCCI    Culture   Final    MODERATE Consistent with normal respiratory flora. Performed at [facility], [place] [address], [place], [place] [address]    Report Status 11/08/2017 FINAL  Final  MRSA PCR Screening     Status: None   Collection Time: 11/06/17 11:17 PM  Result Value Ref Range Status   MRSA by PCR NEGATIVE NEGATIVE Final    Comment:        The GeneXpert MRSA Assay (FDA approved for NASAL specimens only), is one component of a comprehensive MRSA colonization surveillance program. It is  not intended to  diagnose MRSA infection nor to guide or monitor treatment for MRSA infections. Performed at [facility], [place] [address], [place], [place] [address]   Culture, Urine     Status: None   Collection Time: 11/08/17 10:47 AM  Result Value Ref Range Status   Specimen Description   Final    URINE, CLEAN CATCH Performed at [facility], [place] [address], [place], [place] [address]    Special Requests   Final    NONE Performed at [facility], [place] [address], [place], [place] [address]    Culture   Final    NO GROWTH Performed at [facility], [place] [address], [place], [place] [address]    Report Status 11/09/2017 FINAL  Final  Culture, blood (routine x 2)     Status: None (Preliminary result)   Collection Time: 11/08/17 12:12 PM  Result Value Ref Range Status   Specimen Description BLOOD LEFT HAND  Final   Special Requests   Final    BOTTLES DRAWN AEROBIC AND ANAEROBIC Blood Culture adequate volume   Culture PENDING  Incomplete   Report Status PENDING  Incomplete      Radiology Studies: Dg Chest Port 1 View  Result Date: 11/08/2017 CLINICAL DATA:  History of tobacco, alcohol, and drug abuse, stage 3 chronic kidney disease EXAM: PORTABLE CHEST 1 VIEW COMPARISON:  Portable chest x-ray of 11/05/2017 FINDINGS: Mild opacity remains at the lung bases most consistent with atelectasis with some depression of the right minor fissure noted as well. This appearance has improved somewhat compared to the prior chest x-ray. No pleural effusion is seen. Mediastinal and hilar contours are unremarkable and mild cardiomegaly is stable. Tracheostomy remains. Degenerative changes again noted in the shoulders right greater than left. IMPRESSION: 1. Improvement in probable basilar atelectasis. No pneumonia or effusion. 2. Stable cardiomegaly. 3. Tracheostomy remains. Electronically Signed   By: Ivar Drape M.D.   On: 11/08/2017 10:59     Scheduled  Meds: . amLODipine  10 mg Per Tube Daily  . atorvastatin  40 mg Per Tube q1800  . cloNIDine  0.2 mg Per Tube TID  . collagenase   Topical Daily  . feeding supplement (PRO-STAT SUGAR FREE 64)  30 mL Per Tube BID  . folic acid  1 mg Per Tube Daily  . free water  400 mL Per Tube Q6H  . hydrALAZINE  100 mg Per Tube Q8H  . insulin aspart  0-15 Units Subcutaneous Q4H  . insulin aspart  3 Units Subcutaneous Q4H  . insulin glargine  20 Units Subcutaneous BID  . labetalol  400 mg Per Tube TID  . lacosamide  200 mg Per Tube BID  . levETIRAcetam  1,000 mg Per Tube BID  . multivitamin  15 mL Per Tube Daily  . pantoprazole  40 mg Intravenous Q12H  . phenytoin  250 mg Oral BID  . scopolamine  1 patch Transdermal Q72H  . thiamine  100 mg Per Tube Daily  . topiramate  200 mg Per Tube BID   Continuous Infusions: . ceFEPime (MAXIPIME) IV 1 g (11/09/17 1100)  . feeding supplement (GLUCERNA 1.2 CAL) 1,000 mL (11/09/17 0146)  .  sodium bicarbonate (isotonic) infusion in sterile water 75 mL/hr at 11/09/17 0700     LOS: 3 days   Time Spent in minutes   45 minutes   Caven Perine D.O. on 11/09/2017 at 11:01 AM  Between 7am to 7pm -  Pager - [phone]  After 7pm go to [email or web address] - password [email or web address]  And look for the night coverage person covering for me after hours  [facility] Office  [phone]

## 2017-11-09 NOTE — Progress Notes (Signed)
Pharmacy Antibiotic Note  [name] is a 62 y.o. female admitted on 11/05/2017 with pneumonia.  Pharmacy was consulted 4/30 for cefepime and vancomycin dosing.  Plan: Originally Cefepime dosed with  2 Gm x1 then 1 Gm IV q24h > q12 > increase to q8h today Vancomycin 2 Gm x1    Height: 5\' 6"  (167.6 cm) Weight: 169 lb 8.5 oz (76.9 kg) IBW/kg (Calculated) : 59.3  Temp (24hrs), Avg:100.1 F (37.8 C), Min:99.6 F (37.6 C), Max:100.8 F (38.2 C)  Recent Labs  Lab 11/05/17 2200 11/05/17 2208 11/06/17 0033 11/06/17 0517 11/07/17 0304 11/08/17 0603 11/09/17 0319  WBC 18.6*  --   --  14.4* 16.1* 16.5* 18.4*  CREATININE 2.48*  --  2.35* 1.66* 1.47* 1.28* 1.08*  LATICACIDVEN  --  0.94  --   --   --   --   --     Estimated Creatinine Clearance: 58 mL/min (A) (by C-G formula based on SCr of 1.08 mg/dL (H)).    No Known Allergies  Antimicrobials this admission: 4/29 cefepime >>  4/29 vancomycin >> 4/30, resumed 5/3 >>  Dose adjustments this admission: 5/1 Cefepime 1gm from q24h to q12h 5/3 Cefepime 1gm from q12 to q8h  Microbiology results: 4/30 BCx: ngtd 4/30 CDiff: neg/neg 4/30 Trach asp: normal flora, final 4/30 MRSA PCR: neg 5/2 UCx: ng-final 5/2 BCx: ngtd  Thank you for allowing pharmacy to be a part of this patient's care.  Minda Ditto PharmD Pager [phone] 11/09/2017, 1:38 PM

## 2017-11-10 ENCOUNTER — Inpatient Hospital Stay: Payer: Self-pay

## 2017-11-10 LAB — BLOOD CULTURE ID PANEL (REFLEXED)
ACINETOBACTER BAUMANNII: NOT DETECTED
CANDIDA ALBICANS: NOT DETECTED
CANDIDA PARAPSILOSIS: NOT DETECTED
Candida glabrata: NOT DETECTED
Candida krusei: NOT DETECTED
Candida tropicalis: NOT DETECTED
ENTEROBACTERIACEAE SPECIES: NOT DETECTED
Enterobacter cloacae complex: NOT DETECTED
Enterococcus species: NOT DETECTED
Escherichia coli: NOT DETECTED
HAEMOPHILUS INFLUENZAE: NOT DETECTED
KLEBSIELLA OXYTOCA: NOT DETECTED
Klebsiella pneumoniae: NOT DETECTED
Listeria monocytogenes: NOT DETECTED
METHICILLIN RESISTANCE: DETECTED — AB
Neisseria meningitidis: NOT DETECTED
PSEUDOMONAS AERUGINOSA: NOT DETECTED
Proteus species: NOT DETECTED
STREPTOCOCCUS AGALACTIAE: NOT DETECTED
STREPTOCOCCUS PNEUMONIAE: NOT DETECTED
STREPTOCOCCUS PYOGENES: NOT DETECTED
Serratia marcescens: NOT DETECTED
Staphylococcus aureus (BCID): NOT DETECTED
Staphylococcus species: DETECTED — AB
Streptococcus species: NOT DETECTED

## 2017-11-10 LAB — GLUCOSE, CAPILLARY
GLUCOSE-CAPILLARY: 147 mg/dL — AB (ref 65–99)
GLUCOSE-CAPILLARY: 158 mg/dL — AB (ref 65–99)
Glucose-Capillary: 186 mg/dL — ABNORMAL HIGH (ref 65–99)
Glucose-Capillary: 175 mg/dL — ABNORMAL HIGH (ref 65–99)
Glucose-Capillary: 230 mg/dL — ABNORMAL HIGH (ref 65–99)
Glucose-Capillary: 208 mg/dL — ABNORMAL HIGH (ref 65–99)
Glucose-Capillary: 89 mg/dL (ref 65–99)
Glucose-Capillary: 136 mg/dL — ABNORMAL HIGH (ref 65–99)

## 2017-11-10 LAB — MAGNESIUM: MAGNESIUM: 1.7 mg/dL (ref 1.7–2.4)

## 2017-11-10 LAB — BASIC METABOLIC PANEL
Anion gap: 12 (ref 5–15)
BUN: 61 mg/dL — AB (ref 6–20)
CO2: 22 mmol/L (ref 22–32)
CREATININE: 1.13 mg/dL — AB (ref 0.44–1.00)
Calcium: 8.5 mg/dL — ABNORMAL LOW (ref 8.9–10.3)
Chloride: 120 mmol/L — ABNORMAL HIGH (ref 101–111)
GFR calc Af Amer: 60 mL/min — ABNORMAL LOW (ref >60)
GFR calc non Af Amer: 52 mL/min — ABNORMAL LOW (ref >60)
GLUCOSE: 242 mg/dL — AB (ref 65–99)
Potassium: 3.7 mmol/L (ref 3.5–5.1)
Sodium: 154 mmol/L — ABNORMAL HIGH (ref 135–145)

## 2017-11-10 LAB — CBC
HCT: 26.4 % — ABNORMAL LOW (ref 36.0–46.0)
Hemoglobin: 8.2 g/dL — ABNORMAL LOW (ref 12.0–15.0)
MCH: 30 pg (ref 26.0–34.0)
MCHC: 31.1 g/dL (ref 30.0–36.0)
MCV: 96.7 fL (ref 78.0–100.0)
PLATELETS: 179 10*3/uL (ref 150–400)
RBC: 2.73 MIL/uL — ABNORMAL LOW (ref 3.87–5.11)
RDW: 18.3 % — AB (ref 11.5–15.5)
WBC: 18.4 10*3/uL — ABNORMAL HIGH (ref 4.0–10.5)

## 2017-11-10 MED ORDER — INSULIN GLARGINE 100 UNIT/ML ~~LOC~~ SOLN
24.0000 [IU] | Freq: Two times a day (BID) | SUBCUTANEOUS | Status: DC
  Administered 2017-11-10 – 2017-11-16 (×11): 24 [IU] via SUBCUTANEOUS
  Filled 2017-11-10 (×13): qty 0.24

## 2017-11-10 NOTE — Progress Notes (Signed)
Spoke with Energy manager, and Dr Ree Kida re PICC line concerns: positive blood cultures, recent hx of DVT r/t PICC in right arm, CKD 3 and left arm immobility.  Dr Ree Kida notified PCCM for CVC placement.  Will attempt PIV per MD request. Dr Ree Kida did state okay to place PICC with the concerns documented above due to pt need for access, as medical necessity.  I have placed PIV per US guidance in Left cephalic.  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  Stanton Kidney, staff RN, notified of PIV placement in [place].  Also reminded to place Pink restricted armband on RA and d/c RA PIV d/t DVT (originally requested by Raynelle Fanning RN VAS Team this am).  Mary RN states will do.

## 2017-11-10 NOTE — Progress Notes (Signed)
PHARMACY - PHYSICIAN COMMUNICATION CRITICAL VALUE ALERT - BLOOD CULTURE IDENTIFICATION (BCID)  [name] is an 62 y.o. female who presented to [facility] on 11/05/2017 with a chief complaint of pneumonia  Assessment:  PNA (include suspected source if known) 1 of 9 bottles + staph species + MecA- likely contaminant  Name of physician (or Provider) Contacted: Jeannette Corpus, NP  Current antibiotics: Cefepime and Vancomycin  Changes to prescribed antibiotics recommended:  Likely contaminant. Consider narrowing abx when clinically appropriate.   Results for orders placed or performed during the hospital encounter of 10/31/16  Blood Culture ID Panel (Reflexed) (Collected: 10/31/2016  3:13 PM)  Result Value Ref Range   Enterococcus species NOT DETECTED NOT DETECTED   Listeria monocytogenes NOT DETECTED NOT DETECTED   Staphylococcus species DETECTED (A) NOT DETECTED   Staphylococcus aureus NOT DETECTED NOT DETECTED   Methicillin resistance DETECTED (A) NOT DETECTED   Streptococcus species NOT DETECTED NOT DETECTED   Streptococcus agalactiae NOT DETECTED NOT DETECTED   Streptococcus pneumoniae NOT DETECTED NOT DETECTED   Streptococcus pyogenes NOT DETECTED NOT DETECTED   Acinetobacter baumannii NOT DETECTED NOT DETECTED   Enterobacteriaceae species NOT DETECTED NOT DETECTED   Enterobacter cloacae complex NOT DETECTED NOT DETECTED   Escherichia coli NOT DETECTED NOT DETECTED   Klebsiella oxytoca NOT DETECTED NOT DETECTED   Klebsiella pneumoniae NOT DETECTED NOT DETECTED   Proteus species NOT DETECTED NOT DETECTED   Serratia marcescens NOT DETECTED NOT DETECTED   Haemophilus influenzae NOT DETECTED NOT DETECTED   Neisseria meningitidis NOT DETECTED NOT DETECTED   Pseudomonas aeruginosa NOT DETECTED NOT DETECTED   Candida albicans NOT DETECTED NOT DETECTED   Candida glabrata NOT DETECTED NOT DETECTED   Candida krusei NOT DETECTED NOT DETECTED   Candida parapsilosis NOT DETECTED NOT  DETECTED   Candida tropicalis NOT DETECTED NOT DETECTED    Dorrene German 11/10/2017  2:06 AM

## 2017-11-10 NOTE — Progress Notes (Signed)
PROGRESS NOTE    [name]  OVF:[id number] DOB: 1955/09/04 DOA: 11/05/2017 PCP: Nolene Ebbs, MD   Brief Narrative:  HPI on 11/06/2017 by Dr. Jennette Kettle  [name] is a 62 y.o. female with medical history significant of tobacco alcohol and cocaine use, CKD stage III, hyperlipidemia, hypertension, uncontrolled diabetes, history of right upper extremity DVT, history of hepatitis C. 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 Patient was most recently admitted from 4/1 to 4/11 for acute blood loss anemia and hemorrhagic shock.  Initially believed to be due to bleeding from trach site but ultimately determined to be due to abdominal wall hematoma in the R lower abdomen.  Coumadin held initially, patient transfused, and coumadin resumed on discharge. Today patient sent in to ED from SNF after serial hemoglobins (being performed since last admit) declined to 7.1 today. Patient is unable to provide any history herself due to being in persistent vegetative state.  Interim history Admitted for anemia and blood loss. Seen by gastroenterology- recommended conservative management, hold anticoagulation.  Assessment & Plan   Acute blood loss anemia/on chronic anemia, GI bleed -Baseline hemoglobin approximately 9-10, presented with a hemoglobin of 7.6 (was 7.1 prior to admission) -FOBT + -Patient transfused 2 units PRBC, hemoglobin currently 8.2 (currently stable) -CT abdomen pelvis showed no evidence of acute retroperitoneal hematoma.  3.4 cm resolving hematoma/seroma over right  flank.  Mild patchy right lower lobe opacities. -Gastroenterology consulted and appreciated, recommended avoiding anticoagulants if possible, transfuse as needed, supportive care, palliative care consultation  -Given multiple coomorbidities and in a vegetative state, IVC filter would not be of benefit -Continue to monitor CBC  Acute kidney injury on chronic kidney disease, stage III with uremia -Resolved -Creatinine upon admission 2.48, currently improved to 1.13 (baseline 1.2-1.4) -Continue IVF, and received [facility] -EDP discussed with Dr. Joelyn Oms, nephrologist, suggested IV fluids and transfusion, does not need to be transferred to [facility] at this time. -Monitor intake and output closely -Continue to monitor BMP  Hypernatremia/hyprechloremia -Sodium continues to be elevated despite changing IV fluids -Sodium mildly improved today, down to 154 -Chloride improving, 120 today -continue IVF and free water flushes -monitor BMP  Nonanion gap metabolic acidosis -Resolved, likely secondary to hypernatremia/hyperchloremia -bicarb improving, continue to monitor   Sepsis secondary to RLL HCAP vs bacteremia -Upon admission, CXR reviewed and shows possible area of consolidation right lung base -patient has now developed fever of 100.5F along with persistent leukocytosis -Blood cultures show no growth -Sputum culture shows rare WBC present (PMN), rare GPC, moderate culture consistent with normal respiratory flora -Strep pneumonia urine antigen negative -Given increased leukocytosis and fever, will restart vancomycin -Obtain repeat chest x-ray which did show improvement; unremarkable for infection -UA and urine culture unremarkable for infection -Repeat Blood cultures from 11/08/2017: 1/2 GPC: methicillin resistant staph species  -continue cefepime, restarted vancomycin  Hypomagnesemia -magnesium 1.7 after replacement  Diabetes mellitus, type II -CBGs have been uncontrolled- however,  patient also was on D5w -peg feeds restarted -Will increase Lantus to 24 units twice daily, patient was taking 40 units twice daily prior to admission -Increased insulin sliding scale to moderate -Continue NovoLog 3 units every 4 hours -Continue  to monitor CBGs  Essential hypertension -Continue amlodipine, clonidine, hydralazine, labetalol  Persistent vegetative state -Chronic  History of right upper extremity DVT -Coumadin held due to anemia (INR 2.44 on admission)  Decubitus ulcer, stage III -present on admission -Wound care consulted, recommended "Foam dressing to right posterior thigh to protect from moisture and absorb drainage.  Santyl for enzymatic debridement of nonviable tissue to sacrum and foam dressing to surrounding skin on buttocks."  Goals of care -Have consulted palliative care to discuss goals of care and code status -Called daughter along with palliative care to discuss goals of care and CODE STATUS.  Patient's daughter very apprehensive regarding making decisions regarding her mother.  Would like to wait a few weeks until she is able to come see her mother.  This has been ongoing for several months, and no family has been to see the patient.  Patient has been hospitalized 5 times in the last 6 months.  Discussed about patient's current status will not improve, the brain damage that has been done as permanent.  Discussed that patient is not responsive to pain stimuli.  Discussed that patient could be in pain given her current vital signs, and that comfort measures would be recommended.  Daughter would like to obtain patient's medical records (although daughter asked for this information detailed in email, discussed that this is against HIPAA regulations and that she should go through medical records to obtain documentation regarding her mother). -Palliative care will follow the patient once discharged back to nursing facility and try to stay in contact with patient's daughter  for updates. -Daughter requested patient remains full code at this time.  DVT Prophylaxis  SCDs  Code Status: Full  Family Communication: None at bedside.  Disposition Plan: Admitted. Pending improvement in sodium. Suspect SNF at discharge  Consultants Gastroenterology Nephrology  Palliative care  Procedures  none  Antibiotics   Anti-infectives (From admission, onward)   Start     Dose/Rate Route Frequency Ordered Stop   11/09/17 2000  ceFEPIme (MAXIPIME) 1 g in sodium chloride 0.9 % 100 mL IVPB     1 g 200 mL/hr over 30 Minutes Intravenous Every 8 hours 11/09/17 1326     11/09/17 1200  vancomycin (VANCOCIN) 1,250 mg in sodium chloride 0.9 % 250 mL IVPB     1,250 mg 166.7 mL/hr over 90 Minutes Intravenous Every 24 hours 11/09/17 1105     11/08/17 0600  vancomycin (VANCOCIN) IVPB 1000 mg/200 mL premix  Status:  Discontinued     1,000 mg 200 mL/hr over 60 Minutes Intravenous Every 48 hours 11/06/17 0514 11/07/17 1343   11/07/17 1800  vancomycin (VANCOCIN) IVPB 1000 mg/200 mL premix  Status:  Discontinued     1,000 mg 200 mL/hr over 60 Minutes Intravenous Every 36 hours 11/07/17 1343 11/07/17 1423   11/07/17 1330  ceFEPIme (MAXIPIME) 1 g in sodium chloride 0.9 % 100 mL IVPB  Status:  Discontinued     1 g 200 mL/hr over 30 Minutes Intravenous Every 12 hours 11/07/17 1320 11/09/17 1326   11/06/17 2000  ceFEPIme (MAXIPIME) 1 g in sodium chloride 0.9 % 100 mL IVPB  Status:  Discontinued     1 g 200 mL/hr over 30 Minutes Intravenous Every 24 hours 11/06/17 0514 11/07/17 1320   11/06/17 0015  vancomycin (VANCOCIN) 2,000 mg in sodium chloride 0.9 % 500 mL IVPB     2,000 mg 250 mL/hr over 120 Minutes Intravenous  Once 11/05/17 2349 11/06/17 0331   11/06/17  0000  ceFEPIme (MAXIPIME) 2 g in sodium chloride 0.9 % 100 mL IVPB     2 g 200 mL/hr over 30 Minutes Intravenous NOW 11/05/17 2349 11/06/17 0129      Subjective:   [name] seen and examined today.  Chronic  vegetative state.    Objective:   Vitals:   11/10/17 0800 11/10/17 0845 11/10/17 0948 11/10/17 0949  BP:  (!) 179/92 (!) 179/92 (!) 179/92  Pulse:  (!) 101  (!) 101  Resp:  (!) 27    Temp: 99.7 F (37.6 C)     TempSrc: Oral     SpO2:  96%    Weight:      Height:        Intake/Output Summary (Last 24 hours) at 11/10/2017 1124 Last data filed at 11/10/2017 1000 Gross per 24 hour  Intake 1885 ml  Output 3300 ml  Net -1415 ml   Filed Weights   11/08/17 0600 11/09/17 0500 11/10/17 0500  Weight: 77.4 kg (170 lb 10.2 oz) 76.9 kg (169 lb 8.5 oz) 79.7 kg (175 lb 11.3 oz)   Exam  General: Well developed, likely ill-appearing, no apparent distress  HEENT: NCAT, mucous membranes moist.   Neck: Trach  Cardiovascular: S1 S2 auscultated, tachycardic  Respiratory: Diminished, no wheezing  Abdomen: Soft, nontender, nondistended, + bowel sounds, +peg  Extremities: warm dry without cyanosis clubbing or edema  Neuro: Awake, eyes are open, chronic vegetative state  Data Reviewed: I have personally reviewed following labs and imaging studies  CBC: Recent Labs  Lab 11/05/17 2200 11/06/17 0517 11/07/17 0304 11/08/17 0603 11/09/17 0319 11/10/17 0311  WBC 18.6* 14.4* 16.1* 16.5* 18.4* 18.4*  NEUTROABS 14.2*  --   --   --   --   --   HGB 7.1* 7.6* 8.7* 8.7* 8.5* 8.2*  HCT 22.6* 23.7* 27.4* 28.2* 27.2* 26.4*  MCV 95.0 93.7 92.9 94.3 94.8 96.7  PLT 255 172 209 212 188 240   Basic Metabolic Panel: Recent Labs  Lab 11/06/17 0517 11/07/17 0304 11/07/17 1430 11/07/17 1650 11/08/17 0603 11/08/17 1650 11/09/17 0319 11/10/17 0311  NA 146* 156*  --   --  156*  --  160* 154*  K 3.3* 3.7  --   --  3.9  --  3.9 3.7  CL 122* 127*  --   --  128*  --  128* 120*  CO2 14* 19*  --   --  17*  --  21* 22  GLUCOSE 222* 327*  --   --  425*  --  204* 242*  BUN 234* 174*  --   --  104*  --  80* 61*  CREATININE 1.66* 1.47*  --   --  1.28*  --  1.08* 1.13*  CALCIUM 7.1* 9.1  --   --  9.1  --   8.9 8.5*  MG  --   --  2.0 2.0 1.7 1.5*  --  1.7  PHOS  --   --  3.3 3.4 2.6 2.8  --   --    GFR: Estimated Creatinine Clearance: 56.4 mL/min (A) (by C-G formula based on SCr of 1.13 mg/dL (H)). Liver Function Tests: No results for input(s): AST, ALT, ALKPHOS, BILITOT, PROT, ALBUMIN in the last 168 hours. No results for input(s): LIPASE, AMYLASE in the last 168 hours. No results for input(s): AMMONIA in the last 168 hours. Coagulation Profile: Recent Labs  Lab 11/05/17 2200  INR 2.44   Cardiac Enzymes: No results  for input(s): CKTOTAL, CKMB, CKMBINDEX, TROPONINI in the last 168 hours. BNP (last 3 results) No results for input(s): PROBNP in the last 8760 hours. HbA1C: No results for input(s): HGBA1C in the last 72 hours. CBG: Recent Labs  Lab 11/09/17 1530 11/09/17 1929 11/09/17 2320 11/10/17 0318 11/10/17 0744  GLUCAP 188* 177* 143* 230* 208*   Lipid Profile: No results for input(s): CHOL, HDL, LDLCALC, TRIG, CHOLHDL, LDLDIRECT in the last 72 hours. Thyroid Function Tests: No results for input(s): TSH, T4TOTAL, FREET4, T3FREE, THYROIDAB in the last 72 hours. Anemia Panel: No results for input(s): VITAMINB12, FOLATE, FERRITIN, TIBC, IRON, RETICCTPCT in the last 72 hours. Urine analysis:    Component Value Date/Time   COLORURINE YELLOW 11/08/2017 [place] 11/08/2017 1046   LABSPEC 1.012 11/08/2017 1046   PHURINE 6.0 11/08/2017 1046   GLUCOSEU >=500 (A) 11/08/2017 1046   GLUCOSEU > 1000 mg/dL (A) 11/01/2007 2056   HGBUR NEGATIVE 11/08/2017 1046   HGBUR negative 07/15/2007 0954   BILIRUBINUR NEGATIVE 11/08/2017 1046   KETONESUR NEGATIVE 11/08/2017 1046   PROTEINUR 30 (A) 11/08/2017 1046   UROBILINOGEN 1.0 04/09/2012 1524   NITRITE NEGATIVE 11/08/2017 1046   LEUKOCYTESUR TRACE (A) 11/08/2017 1046   Sepsis Labs: @LABRCNTIP (procalcitonin:4,lacticidven:4)  ) Recent Results (from the past 240 hour(s))  Blood culture (routine x 2)     Status: None  (Preliminary result)   Collection Time: 11/06/17 12:14 AM  Result Value Ref Range Status   Specimen Description   Final    BLOOD LEFT ANTECUBITAL Performed at [facility], [place] [address] [place], [place] [address]    Special Requests   Final    BOTTLES DRAWN AEROBIC AND ANAEROBIC Blood Culture adequate volume Performed at [place] [address], [place], [place] [address]    Culture   Final    NO GROWTH 3 DAYS Performed at [facility], [place] [address] [place], [place] [address]    Report Status PENDING  Incomplete  Blood culture (routine x 2)     Status: None (Preliminary result)   Collection Time: 11/06/17 12:33 AM  Result Value Ref Range Status   Specimen Description   Final    BLOOD RIGHT FOREARM Performed at [place] [address] [place], [place] [address]    Special Requests   Final    BOTTLES DRAWN AEROBIC AND ANAEROBIC Blood Culture adequate volume Performed at [place] [address] [place], [place] [address]    Culture   Final    NO GROWTH 3 DAYS Performed at [facility], [place] [address], [place], [place] [address]    Report Status PENDING  Incomplete  Culture, respiratory (NON-Expectorated)     Status: None   Collection Time: 11/06/17  7:45 AM  Result Value Ref Range Status   Specimen Description   Final    TRACHEAL ASPIRATE Performed at [place] [address], [place], [place] [address]    Special Requests   Final    Normal Performed at [facility], [place] [address], [place], [place] [address]    Gram Stain   Final    RARE WBC PRESENT, PREDOMINANTLY PMN RARE GRAM POSITIVE COCCI    Culture   Final    MODERATE Consistent with normal respiratory flora. Performed at [facility], [place] [address] [place], [place] [address]    Report Status 11/08/2017 FINAL  Final  MRSA  PCR Screening     Status: None     Collection Time: 11/06/17 11:17 PM  Result Value Ref Range Status   MRSA by PCR NEGATIVE NEGATIVE Final    Comment:        The GeneXpert MRSA Assay (FDA approved for NASAL specimens only), is one component of a comprehensive MRSA colonization surveillance program. It is not intended to diagnose MRSA infection nor to guide or monitor treatment for MRSA infections. Performed at [facility], [place] [address], [place], [place] [address]   Culture, Urine     Status: None   Collection Time: 11/08/17 10:47 AM  Result Value Ref Range Status   Specimen Description   Final    URINE, CLEAN CATCH Performed at [facility], [place] [address], [place], [place] [address]    Special Requests   Final    NONE Performed at [facility], [place] [address], [place], [place] [address]    Culture   Final    NO GROWTH Performed at [facility], [place] [address] [place], [place] [address]    Report Status 11/09/2017 FINAL  Final  Culture, blood (routine x 2)     Status: None (Preliminary result)   Collection Time: 11/08/17 12:12 PM  Result Value Ref Range Status   Specimen Description BLOOD LEFT HAND  Final   Special Requests   Final    BOTTLES DRAWN AEROBIC AND ANAEROBIC Blood Culture adequate volume   Culture   Final    NO GROWTH < 24 HOURS Performed at [facility], [place] [address] [place], [place] [address]    Report Status PENDING  Incomplete  Culture, blood (routine x 2)     Status: None (Preliminary result)   Collection Time: 11/08/17 12:16 PM  Result Value Ref Range Status   Specimen Description   Final    BLOOD RIGHT HAND Performed at [place] [address] [place], [place] [address]    Special Requests   Final    BOTTLES DRAWN AEROBIC ONLY Blood Culture results may not be optimal due to an inadequate volume of blood received in culture bottles Performed at [place] [address] [place], [place] [address]    Culture  Setup Time   Final    AEROBIC BOTTLE ONLY GRAM POSITIVE COCCI CRITICAL RESULT CALLED TO, READ BACK BY AND VERIFIED WITH: B GREEN PHARMD 11/10/17 0202 JDW    Culture   Final    GRAM POSITIVE COCCI TOO YOUNG TO READ Performed at [facility], [place] [address] [place],  [address]    Report Status PENDING  Incomplete  Blood Culture ID Panel (Reflexed)     Status: Abnormal   Collection Time: 11/08/17 12:16 PM  Result Value Ref Range Status   Enterococcus species NOT DETECTED NOT DETECTED Final   Listeria monocytogenes NOT DETECTED NOT DETECTED Final   Staphylococcus species DETECTED (A) NOT DETECTED Final    Comment: Methicillin (oxacillin) resistant coagulase negative staphylococcus. Possible blood culture contaminant (unless isolated from more than one blood culture draw or clinical case suggests pathogenicity). No antibiotic treatment is indicated for blood  culture contaminants. CRITICAL RESULT CALLED TO, READ BACK BY AND VERIFIED WITH: B GREEN PHARMD 11/10/17 0202 JDW    Staphylococcus aureus NOT DETECTED NOT DETECTED Final   Methicillin resistance DETECTED (A) NOT DETECTED Final    Comment: CRITICAL RESULT CALLED TO, READ BACK BY AND  VERIFIED WITH: B GREEN PHARMD 11/10/17 0202 JDW    Streptococcus species NOT DETECTED NOT DETECTED Final   Streptococcus agalactiae NOT DETECTED NOT DETECTED Final   Streptococcus pneumoniae NOT DETECTED NOT DETECTED Final   Streptococcus pyogenes NOT DETECTED NOT DETECTED Final   Acinetobacter baumannii NOT DETECTED NOT DETECTED Final   Enterobacteriaceae species NOT DETECTED NOT DETECTED Final   Enterobacter cloacae complex NOT DETECTED NOT DETECTED Final   Escherichia coli NOT DETECTED NOT DETECTED Final   Klebsiella oxytoca NOT DETECTED NOT DETECTED Final   Klebsiella pneumoniae NOT DETECTED NOT DETECTED Final   Proteus species NOT DETECTED NOT DETECTED Final   Serratia marcescens NOT  DETECTED NOT DETECTED Final   Haemophilus influenzae NOT DETECTED NOT DETECTED Final   Neisseria meningitidis NOT DETECTED NOT DETECTED Final   Pseudomonas aeruginosa NOT DETECTED NOT DETECTED Final   Candida albicans NOT DETECTED NOT DETECTED Final   Candida glabrata NOT DETECTED NOT DETECTED Final   Candida krusei NOT DETECTED NOT DETECTED Final   Candida parapsilosis NOT DETECTED NOT DETECTED Final   Candida tropicalis NOT DETECTED NOT DETECTED Final      Radiology Studies: No results found.   Scheduled Meds: . amLODipine  10 mg Per Tube Daily  . atorvastatin  40 mg Per Tube q1800  . cloNIDine  0.2 mg Per Tube TID  . collagenase   Topical Daily  . feeding supplement (PRO-STAT SUGAR FREE 64)  30 mL Per Tube BID  . folic acid  1 mg Per Tube Daily  . free water  400 mL Per Tube Q6H  . hydrALAZINE  100 mg Per Tube Q8H  . insulin aspart  0-15 Units Subcutaneous Q4H  . insulin aspart  3 Units Subcutaneous Q4H  . insulin glargine  20 Units Subcutaneous BID  . labetalol  400 mg Per Tube TID  . lacosamide  200 mg Per Tube BID  . levETIRAcetam  1,000 mg Per Tube BID  . multivitamin  15 mL Per Tube Daily  . pantoprazole  40 mg Intravenous Q12H  . phenytoin  250 mg Oral BID  . scopolamine  1 patch Transdermal Q72H  . thiamine  100 mg Per Tube Daily  . topiramate  200 mg Per Tube BID   Continuous Infusions: . ceFEPime (MAXIPIME) IV Stopped (11/10/17 0540)  . feeding supplement (GLUCERNA 1.2 CAL) Stopped (11/09/17 2301)  .  sodium bicarbonate (isotonic) infusion in sterile water 75 mL/hr at 11/10/17 0456  . vancomycin Stopped (11/09/17 1315)     LOS: 4 days   Time Spent in minutes   45 minutes   Sumayya Muha D.O. on 11/10/2017 at 11:24 AM  Between 7am to 7pm - Pager - [phone]  After 7pm go to [email or web address] - password [email or web address]  And look for the night coverage person covering for me after hours  [facility] Office  [phone]

## 2017-11-11 DIAGNOSIS — A419 Sepsis, unspecified organism: Secondary | ICD-10-CM

## 2017-11-11 LAB — CULTURE, BLOOD (ROUTINE X 2)
CULTURE: NO GROWTH
CULTURE: NO GROWTH
SPECIAL REQUESTS: ADEQUATE
Special Requests: ADEQUATE

## 2017-11-11 LAB — CBC
HEMATOCRIT: 28.7 % — AB (ref 36.0–46.0)
Hemoglobin: 8.7 g/dL — ABNORMAL LOW (ref 12.0–15.0)
MCH: 29.4 pg (ref 26.0–34.0)
MCHC: 30.3 g/dL (ref 30.0–36.0)
MCV: 97 fL (ref 78.0–100.0)
Platelets: 165 10*3/uL (ref 150–400)
RBC: 2.96 MIL/uL — AB (ref 3.87–5.11)
RDW: 17.7 % — AB (ref 11.5–15.5)
WBC: 17.9 10*3/uL — AB (ref 4.0–10.5)

## 2017-11-11 LAB — GLUCOSE, CAPILLARY
GLUCOSE-CAPILLARY: 245 mg/dL — AB (ref 65–99)
GLUCOSE-CAPILLARY: 109 mg/dL — AB (ref 65–99)
Glucose-Capillary: 157 mg/dL — ABNORMAL HIGH (ref 65–99)
Glucose-Capillary: 222 mg/dL — ABNORMAL HIGH (ref 65–99)
Glucose-Capillary: 176 mg/dL — ABNORMAL HIGH (ref 65–99)

## 2017-11-11 LAB — BASIC METABOLIC PANEL
Anion gap: 11 (ref 5–15)
BUN: 48 mg/dL — ABNORMAL HIGH (ref 6–20)
CO2: 25 mmol/L (ref 22–32)
Calcium: 8.6 mg/dL — ABNORMAL LOW (ref 8.9–10.3)
Chloride: 115 mmol/L — ABNORMAL HIGH (ref 101–111)
Creatinine, Ser: 0.93 mg/dL (ref 0.44–1.00)
GFR calc Af Amer: >60 mL/min (ref >60)
GFR calc non Af Amer: >60 mL/min (ref >60)
Glucose, Bld: 166 mg/dL — ABNORMAL HIGH (ref 65–99)
Potassium: 3.5 mmol/L (ref 3.5–5.1)
Sodium: 151 mmol/L — ABNORMAL HIGH (ref 135–145)

## 2017-11-11 LAB — MAGNESIUM: Magnesium: 1.5 mg/dL — ABNORMAL LOW (ref 1.7–2.4)

## 2017-11-11 MED ORDER — MAGNESIUM SULFATE 2 GM/50ML IV SOLN
2.0000 g | Freq: Once | INTRAVENOUS | Status: AC
  Administered 2017-11-11: 2 g via INTRAVENOUS
  Filled 2017-11-11: qty 50

## 2017-11-11 NOTE — Plan of Care (Signed)
  Problem: Nutrition: Goal: Adequate nutrition will be maintained Outcome: Progressing   

## 2017-11-11 NOTE — Progress Notes (Signed)
SBAR REPORT GIVEN TO HUI, RN; CARE RELINQUISHED.

## 2017-11-11 NOTE — Progress Notes (Signed)
PROGRESS NOTE    [name]  KZS:[id number] DOB: 07-03-1956 DOA: 11/05/2017 PCP: Nolene Ebbs, MD   Brief Narrative:  HPI on 11/06/2017 by Dr. Jennette Kettle  [name] is a 62 y.o. female with medical history significant of tobacco alcohol and cocaine use, CKD stage III, hyperlipidemia, hypertension, uncontrolled diabetes, history of right upper extremity DVT, history of hepatitis C. 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 Patient was most recently admitted from 4/1 to 4/11 for acute blood loss anemia and hemorrhagic shock.  Initially believed to be due to bleeding from trach site but ultimately determined to be due to abdominal wall hematoma in the R lower abdomen.  Coumadin held initially, patient transfused, and coumadin resumed on discharge. Today patient sent in to ED from SNF after serial hemoglobins (being performed since last admit) declined to 7.1 today. Patient is unable to provide any history herself due to being in persistent vegetative state.  Interim history Admitted for anemia and blood loss. Seen by gastroenterology- recommended conservative management, hold anticoagulation.  Assessment & Plan   Acute blood loss anemia/on chronic anemia, GI bleed -Baseline hemoglobin approximately 9-10, presented with a hemoglobin of 8.7 (was 7.1 prior to admission) -FOBT + -Patient transfused 2 units PRBC, hemoglobin currently 8.7 (currently stable) -CT abdomen pelvis showed no evidence of acute retroperitoneal hematoma.  3.4 cm resolving hematoma/seroma over right  flank.  Mild patchy right lower lobe opacities. -Gastroenterology consulted and appreciated, recommended avoiding anticoagulants if possible, transfuse as needed, supportive care, palliative care consultation  -Given multiple coomorbidities and in a vegetative state, IVC filter would not be of benefit -Continue to monitor CBC  Acute kidney injury on chronic kidney disease, stage III with uremia -Resolved -Creatinine upon admission 2.48, currently improved to 0.93 (baseline 1.2-1.4) -Continue IVF, and received [facility] -EDP discussed with Dr. Joelyn Oms, nephrologist, suggested IV fluids and transfusion, does not need to be transferred to [facility] at this time. -Monitor intake and output closely -Continue to monitor BMP  Hypernatremia/hyprechloremia -Sodium continues to be elevated despite changing IV fluids -Sodium mildly improved today, down to 151 -Chloride improving, 115 today -continue IVF and free water flushes -monitor BMP  Nonanion gap metabolic acidosis -Resolved, likely secondary to hypernatremia/hyperchloremia -bicarb 25 -continue to monitor   Sepsis secondary to RLL HCAP vs bacteremia -Upon admission, CXR reviewed and shows possible area of consolidation right lung base -patient has now developed fever of 100.23F along with persistent leukocytosis -Blood cultures show no growth -Sputum culture shows rare WBC present (PMN), rare GPC, moderate culture consistent with normal respiratory flora -Strep pneumonia urine antigen negative -Given increased leukocytosis and fever, will restart vancomycin -Obtain repeat chest x-ray which did show improvement; unremarkable for infection -UA and urine culture unremarkable for infection -Repeat Blood cultures from 11/08/2017: 1/2 GPC: methicillin resistant staph species  -continue cefepime, restarted vancomycin -will repeat blood cultures  Hypomagnesemia -magnesium 1.5, will replace  Diabetes mellitus, type II -CBGs have been  uncontrolled- however, patient also was on D5w -peg feeds restarted -Will increase Lantus to 24 units twice daily, patient was taking 40 units twice daily prior to admission -Increased insulin sliding scale to moderate -Continue NovoLog 3 units  every 4 hours -Continue to monitor CBGs  Essential hypertension -Continue amlodipine, clonidine, hydralazine, labetalol  Persistent vegetative state -Chronic  History of right upper extremity DVT -Coumadin held due to anemia (INR 2.44 on admission)  Decubitus ulcer, stage III -present on admission -Wound care consulted, recommended "Foam dressing to right posterior thigh to protect from moisture and absorb drainage.  Santyl for enzymatic debridement of nonviable tissue to sacrum and foam dressing to surrounding skin on buttocks."  Goals of care -Have consulted palliative care to discuss goals of care and code status -Called daughter along with palliative care to discuss goals of care and CODE STATUS.  Patient's daughter very apprehensive regarding making decisions regarding her mother.  Would like to wait a few weeks until she is able to come see her mother.  This has been ongoing for several months, and no family has been to see the patient.  Patient has been hospitalized 5 times in the last 6 months.  Discussed about patient's current status will not improve, the brain damage that has been done as permanent.  Discussed that patient is not responsive to pain stimuli.  Discussed that patient could be in pain given her current vital signs, and that comfort measures would be recommended.  Daughter would like to obtain patient's medical records (although daughter asked for this information detailed in email, discussed that this is against HIPAA regulations and that she should go through medical records to obtain documentation regarding her mother). -Palliative care will follow the patient once discharged back to nursing facility and try to stay in contact  with patient's daughter for updates. -Daughter requested patient remains full code at this time.  DVT Prophylaxis  SCDs  Code Status: Full  Family Communication: None at bedside.  Disposition Plan: Admitted. Pending improvement in sodium. Suspect SNF at discharge  Consultants Gastroenterology Nephrology  Palliative care  Procedures  none  Antibiotics   Anti-infectives (From admission, onward)   Start     Dose/Rate Route Frequency Ordered Stop   11/09/17 2000  ceFEPIme (MAXIPIME) 1 g in sodium chloride 0.9 % 100 mL IVPB     1 g 200 mL/hr over 30 Minutes Intravenous Every 8 hours 11/09/17 1326     11/09/17 1200  vancomycin (VANCOCIN) 1,250 mg in sodium chloride 0.9 % 250 mL IVPB     1,250 mg 166.7 mL/hr over 90 Minutes Intravenous Every 24 hours 11/09/17 1105     11/08/17 0600  vancomycin (VANCOCIN) IVPB 1000 mg/200 mL premix  Status:  Discontinued     1,000 mg 200 mL/hr over 60 Minutes Intravenous Every 48 hours 11/06/17 0514 11/07/17 1343   11/07/17 1800  vancomycin (VANCOCIN) IVPB 1000 mg/200 mL premix  Status:  Discontinued     1,000 mg 200 mL/hr over 60 Minutes Intravenous Every 36 hours 11/07/17 1343 11/07/17 1423   11/07/17 1330  ceFEPIme (MAXIPIME) 1 g in sodium chloride 0.9 % 100 mL IVPB  Status:  Discontinued     1 g 200 mL/hr over 30 Minutes Intravenous Every 12 hours 11/07/17 1320 11/09/17 1326   11/06/17 2000  ceFEPIme (MAXIPIME) 1 g in sodium chloride 0.9 % 100 mL IVPB  Status:  Discontinued     1 g 200 mL/hr over 30 Minutes Intravenous Every 24 hours 11/06/17 0514 11/07/17 1320   11/06/17 0015  vancomycin (VANCOCIN) 2,000 mg in sodium chloride 0.9 % 500 mL IVPB     2,000 mg 250 mL/hr over 120 Minutes Intravenous  Once 11/05/17 2349 11/06/17  1191   11/06/17 0000  ceFEPIme (MAXIPIME) 2 g in sodium chloride 0.9 % 100 mL IVPB     2 g 200 mL/hr over 30 Minutes Intravenous NOW 11/05/17 2349 11/06/17 0129      Subjective:   [name] seen and examined  today.  Chronic vegetative state.    Objective:   Vitals:   11/11/17 0346 11/11/17 0358 11/11/17 0700 11/11/17 0800  BP:    (!) 185/91  Pulse: 89  98 98  Resp: (!) 22  20 (!) 27  Temp:  99.8 F (37.7 C)    TempSrc:  Axillary    SpO2: 100%  99% 99%  Weight:      Height:        Intake/Output Summary (Last 24 hours) at 11/11/2017 1056 Last data filed at 11/11/2017 0821 Gross per 24 hour  Intake 2125 ml  Output 3600 ml  Net -1475 ml   Filed Weights   11/08/17 0600 11/09/17 0500 11/10/17 0500  Weight: 77.4 kg (170 lb 10.2 oz) 76.9 kg (169 lb 8.5 oz) 79.7 kg (175 lb 11.3 oz)   Exam  General: Well developed, chronically ill appearing, NAD  HEENT: NCAT, mucous membranes moist.   Neck: Trach  Cardiovascular: S1 S2 auscultated, no rubs, murmurs or gallops. Regular rate and rhythm.  Respiratory: diminished   Abdomen: Soft, nontender, nondistended, + bowel sounds, +peg  Extremities: warm dry without cyanosis clubbing or edema  Neuro: chronic vegetative state  Data Reviewed: I have personally reviewed following labs and imaging studies  CBC: Recent Labs  Lab 11/05/17 2200  11/07/17 0304 11/08/17 0603 11/09/17 0319 11/10/17 0311 11/11/17 0332  WBC 18.6*   < > 16.1* 16.5* 18.4* 18.4* 17.9*  NEUTROABS 14.2*  --   --   --   --   --   --   HGB 7.1*   < > 8.7* 8.7* 8.5* 8.2* 8.7*  HCT 22.6*   < > 27.4* 28.2* 27.2* 26.4* 28.7*  MCV 95.0   < > 92.9 94.3 94.8 96.7 97.0  PLT 255   < > 209 212 188 179 165   < > = values in this interval not displayed.   Basic Metabolic Panel: Recent Labs  Lab 11/07/17 0304  11/07/17 1430 11/07/17 1650 11/08/17 0603 11/08/17 1650 11/09/17 0319 11/10/17 0311 11/11/17 0332  NA 156*  --   --   --  156*  --  160* 154* 151*  K 3.7  --   --   --  3.9  --  3.9 3.7 3.5  CL 127*  --   --   --  128*  --  128* 120* 115*  CO2 19*  --   --   --  17*  --  21* 22 25  GLUCOSE 327*  --   --   --  425*  --  204* 242* 166*  BUN 174*  --   --   --   104*  --  80* 61* 48*  CREATININE 1.47*  --   --   --  1.28*  --  1.08* 1.13* 0.93  CALCIUM 9.1  --   --   --  9.1  --  8.9 8.5* 8.6*  MG  --    < > 2.0 2.0 1.7 1.5*  --  1.7 1.5*  PHOS  --   --  3.3 3.4 2.6 2.8  --   --   --    < > = values in  this interval not displayed.   GFR: Estimated Creatinine Clearance: 68.5 mL/min (by C-G formula based on SCr of 0.93 mg/dL). Liver Function Tests: No results for input(s): AST, ALT, ALKPHOS, BILITOT, PROT, ALBUMIN in the last 168 hours. No results for input(s): LIPASE, AMYLASE in the last 168 hours. No results for input(s): AMMONIA in the last 168 hours. Coagulation Profile: Recent Labs  Lab 11/05/17 2200  INR 2.44   Cardiac Enzymes: No results for input(s): CKTOTAL, CKMB, CKMBINDEX, TROPONINI in the last 168 hours. BNP (last 3 results) No results for input(s): PROBNP in the last 8760 hours. HbA1C: No results for input(s): HGBA1C in the last 72 hours. CBG: Recent Labs  Lab 11/10/17 1621 11/10/17 1948 11/10/17 2349 11/11/17 0320 11/11/17 0802  GLUCAP 89 136* 158* 157* 245*   Lipid Profile: No results for input(s): CHOL, HDL, LDLCALC, TRIG, CHOLHDL, LDLDIRECT in the last 72 hours. Thyroid Function Tests: No results for input(s): TSH, T4TOTAL, FREET4, T3FREE, THYROIDAB in the last 72 hours. Anemia Panel: No results for input(s): VITAMINB12, FOLATE, FERRITIN, TIBC, IRON, RETICCTPCT in the last 72 hours. Urine analysis:    Component Value Date/Time   COLORURINE YELLOW 11/08/2017 [place] 11/08/2017 1046   LABSPEC 1.012 11/08/2017 1046   PHURINE 6.0 11/08/2017 1046   GLUCOSEU >=500 (A) 11/08/2017 1046   GLUCOSEU > 1000 mg/dL (A) 11/01/2007 2056   HGBUR NEGATIVE 11/08/2017 1046   HGBUR negative 07/15/2007 0954   BILIRUBINUR NEGATIVE 11/08/2017 1046   KETONESUR NEGATIVE 11/08/2017 1046   PROTEINUR 30 (A) 11/08/2017 1046   UROBILINOGEN 1.0 04/09/2012 1524   NITRITE NEGATIVE 11/08/2017 1046   LEUKOCYTESUR TRACE  (A) 11/08/2017 1046   Sepsis Labs: @LABRCNTIP (procalcitonin:4,lacticidven:4)  ) Recent Results (from the past 240 hour(s))  Blood culture (routine x 2)     Status: None (Preliminary result)   Collection Time: 11/06/17 12:14 AM  Result Value Ref Range Status   Specimen Description   Final    BLOOD LEFT ANTECUBITAL Performed at [facility], [place] [address] [place], [place] [address]    Special Requests   Final    BOTTLES DRAWN AEROBIC AND ANAEROBIC Blood Culture adequate volume Performed at [place] [address], [place], [place] [address]    Culture   Final    NO GROWTH 4 DAYS Performed at [facility], [place] [address] [place], [place] [address]    Report Status PENDING  Incomplete  Blood culture (routine x 2)     Status: None (Preliminary result)   Collection Time: 11/06/17 12:33 AM  Result Value Ref Range Status   Specimen Description   Final    BLOOD RIGHT FOREARM Performed at [place] [address], [place], [place] [address]    Special Requests   Final    BOTTLES DRAWN AEROBIC AND ANAEROBIC Blood Culture adequate volume Performed at [place] [address] [place], [place] [address]    Culture   Final    NO GROWTH 4 DAYS Performed at [facility], [place] [address], [place], [place] [address]    Report Status PENDING  Incomplete  Culture, respiratory (NON-Expectorated)     Status: None   Collection Time: 11/06/17  7:45 AM  Result Value Ref Range Status   Specimen Description   Final    TRACHEAL ASPIRATE Performed at [place] [address] [place], [place] [address]    Special Requests   Final  Normal Performed at [facility], [place] [address], [place], [place] [address]    Gram Stain   Final    RARE WBC PRESENT, PREDOMINANTLY PMN RARE GRAM POSITIVE COCCI    Culture   Final    MODERATE Consistent with normal  respiratory flora. Performed at [facility], [place] [address], [place], [place] [address]    Report Status 11/08/2017 FINAL  Final  MRSA PCR Screening     Status: None   Collection Time: 11/06/17 11:17 PM  Result Value Ref Range Status   MRSA by PCR NEGATIVE NEGATIVE Final    Comment:        The GeneXpert MRSA Assay (FDA approved for NASAL specimens only), is one component of a comprehensive MRSA colonization surveillance program. It is not intended to diagnose MRSA infection nor to guide or monitor treatment for MRSA infections. Performed at [facility], [place] [address] [place], [place] [address]   Culture, Urine     Status: None   Collection Time: 11/08/17 10:47 AM  Result Value Ref Range Status   Specimen Description   Final    URINE, CLEAN CATCH Performed at [facility], [place] [address], [place], [place] [address]    Special Requests   Final    NONE Performed at [facility], [place] [address], [place], [place] [address]    Culture   Final    NO GROWTH Performed at [facility], [place] [address], [place], [place] [address]    Report Status 11/09/2017 FINAL  Final  Culture, blood (routine x 2)     Status: None (Preliminary result)   Collection Time: 11/08/17 12:12 PM  Result Value Ref Range Status   Specimen Description BLOOD LEFT HAND  Final   Special Requests   Final    BOTTLES DRAWN AEROBIC AND ANAEROBIC Blood Culture adequate volume   Culture   Final    NO GROWTH 2 DAYS Performed at [facility], [place] [address] [place], [place] [address]    Report Status PENDING  Incomplete  Culture, blood (routine x 2)     Status: Abnormal   Collection Time: 11/08/17 12:16 PM  Result Value Ref Range Status   Specimen Description   Final    BLOOD RIGHT HAND Performed at [place] [address] [place], [place] [address]    Special Requests   Final    BOTTLES DRAWN AEROBIC ONLY  Blood Culture results may not be optimal due to an inadequate volume of blood received in culture bottles Performed at [place] [address], [place], [place] [address]    Culture  Setup Time   Final    AEROBIC BOTTLE ONLY GRAM POSITIVE COCCI CRITICAL RESULT CALLED TO, READ BACK BY AND VERIFIED WITH: B GREEN PHARMD 11/10/17 0202 JDW    Culture (A)  Final    STAPHYLOCOCCUS SPECIES (COAGULASE NEGATIVE) THE SIGNIFICANCE OF ISOLATING THIS ORGANISM FROM A SINGLE SET OF BLOOD CULTURES WHEN MULTIPLE SETS ARE DRAWN IS UNCERTAIN. PLEASE NOTIFY THE MICROBIOLOGY DEPARTMENT WITHIN ONE WEEK IF SPECIATION AND SENSITIVITIES ARE REQUIRED. Performed at [facility], [place] [address] [place],  [address]    Report Status 11/11/2017 FINAL  Final  Blood Culture ID Panel (Reflexed)     Status: Abnormal   Collection Time: 11/08/17 12:16 PM  Result Value Ref Range Status   Enterococcus species NOT DETECTED NOT DETECTED Final  Listeria monocytogenes NOT DETECTED NOT DETECTED Final   Staphylococcus species DETECTED (A) NOT DETECTED Final    Comment: Methicillin (oxacillin) resistant coagulase negative staphylococcus. Possible blood culture contaminant (unless isolated from more than one blood culture draw or clinical case suggests pathogenicity). No antibiotic treatment is indicated for blood  culture contaminants. CRITICAL RESULT CALLED TO, READ BACK BY AND VERIFIED WITH: B GREEN PHARMD 11/10/17 0202 JDW    Staphylococcus aureus NOT DETECTED NOT DETECTED Final   Methicillin resistance DETECTED (A) NOT DETECTED Final    Comment: CRITICAL RESULT CALLED TO, READ BACK BY AND VERIFIED WITH: B GREEN PHARMD 11/10/17 0202 JDW    Streptococcus species NOT DETECTED NOT DETECTED Final   Streptococcus agalactiae NOT DETECTED NOT DETECTED Final   Streptococcus pneumoniae NOT DETECTED NOT DETECTED Final   Streptococcus pyogenes NOT DETECTED NOT DETECTED Final   Acinetobacter baumannii NOT  DETECTED NOT DETECTED Final   Enterobacteriaceae species NOT DETECTED NOT DETECTED Final   Enterobacter cloacae complex NOT DETECTED NOT DETECTED Final   Escherichia coli NOT DETECTED NOT DETECTED Final   Klebsiella oxytoca NOT DETECTED NOT DETECTED Final   Klebsiella pneumoniae NOT DETECTED NOT DETECTED Final   Proteus species NOT DETECTED NOT DETECTED Final   Serratia marcescens NOT DETECTED NOT DETECTED Final   Haemophilus influenzae NOT DETECTED NOT DETECTED Final   Neisseria meningitidis NOT DETECTED NOT DETECTED Final   Pseudomonas aeruginosa NOT DETECTED NOT DETECTED Final   Candida albicans NOT DETECTED NOT DETECTED Final   Candida glabrata NOT DETECTED NOT DETECTED Final   Candida krusei NOT DETECTED NOT DETECTED Final   Candida parapsilosis NOT DETECTED NOT DETECTED Final   Candida tropicalis NOT DETECTED NOT DETECTED Final      Radiology Studies: [place] Ekg Site Rite  Result Date: 11/10/2017 If Site Rite image not attached, placement could not be confirmed due to current cardiac rhythm.    Scheduled Meds: . amLODipine  10 mg Per Tube Daily  . atorvastatin  40 mg Per Tube q1800  . cloNIDine  0.2 mg Per Tube TID  . collagenase   Topical Daily  . feeding supplement (PRO-STAT SUGAR FREE 64)  30 mL Per Tube BID  . folic acid  1 mg Per Tube Daily  . free water  400 mL Per Tube Q6H  . hydrALAZINE  100 mg Per Tube Q8H  . insulin aspart  0-15 Units Subcutaneous Q4H  . insulin aspart  3 Units Subcutaneous Q4H  . insulin glargine  24 Units Subcutaneous BID  . labetalol  400 mg Per Tube TID  . lacosamide  200 mg Per Tube BID  . levETIRAcetam  1,000 mg Per Tube BID  . multivitamin  15 mL Per Tube Daily  . pantoprazole  40 mg Intravenous Q12H  . phenytoin  250 mg Oral BID  . scopolamine  1 patch Transdermal Q72H  . thiamine  100 mg Per Tube Daily  . topiramate  200 mg Per Tube BID   Continuous Infusions: . ceFEPime (MAXIPIME) IV Stopped (11/11/17 0641)  . feeding supplement  (GLUCERNA 1.2 CAL) 1,000 mL (11/11/17 0816)  .  sodium bicarbonate (isotonic) infusion in sterile water 75 mL/hr at 11/11/17 0818  . vancomycin Stopped (11/10/17 1521)     LOS: 5 days   Time Spent in minutes   30 minutes   Darlynn Ricco D.O. on 11/11/2017 at 10:56 AM  Between 7am to 7pm - Pager - [phone]  After 7pm go to [email or web address] - password [email or web address]  And  look for the night coverage person covering for me after hours  [facility] Office  [phone]

## 2017-11-12 LAB — BASIC METABOLIC PANEL
Anion gap: 12 (ref 5–15)
BUN: 49 mg/dL — ABNORMAL HIGH (ref 6–20)
CHLORIDE: 112 mmol/L — AB (ref 101–111)
CO2: 24 mmol/L (ref 22–32)
Calcium: 8.3 mg/dL — ABNORMAL LOW (ref 8.9–10.3)
Creatinine, Ser: 1 mg/dL (ref 0.44–1.00)
GFR calc non Af Amer: 60 mL/min — ABNORMAL LOW (ref >60)
GLUCOSE: 120 mg/dL — AB (ref 65–99)
Potassium: 3.5 mmol/L (ref 3.5–5.1)
Sodium: 148 mmol/L — ABNORMAL HIGH (ref 135–145)

## 2017-11-12 LAB — CBC
HEMATOCRIT: 27.1 % — AB (ref 36.0–46.0)
HEMOGLOBIN: 8.5 g/dL — AB (ref 12.0–15.0)
MCH: 30.4 pg (ref 26.0–34.0)
MCHC: 31.4 g/dL (ref 30.0–36.0)
MCV: 96.8 fL (ref 78.0–100.0)
Platelets: 157 10*3/uL (ref 150–400)
RBC: 2.8 MIL/uL — ABNORMAL LOW (ref 3.87–5.11)
RDW: 17.1 % — ABNORMAL HIGH (ref 11.5–15.5)
WBC: 17.3 10*3/uL — ABNORMAL HIGH (ref 4.0–10.5)

## 2017-11-12 LAB — GLUCOSE, CAPILLARY
GLUCOSE-CAPILLARY: 159 mg/dL — AB (ref 65–99)
GLUCOSE-CAPILLARY: 160 mg/dL — AB (ref 65–99)
GLUCOSE-CAPILLARY: 80 mg/dL (ref 65–99)
GLUCOSE-CAPILLARY: 86 mg/dL (ref 65–99)
GLUCOSE-CAPILLARY: 91 mg/dL (ref 65–99)
GLUCOSE-CAPILLARY: 96 mg/dL (ref 65–99)
Glucose-Capillary: 124 mg/dL — ABNORMAL HIGH (ref 65–99)

## 2017-11-12 LAB — MAGNESIUM: Magnesium: 1.5 mg/dL — ABNORMAL LOW (ref 1.7–2.4)

## 2017-11-12 MED ORDER — VANCOMYCIN HCL IN DEXTROSE 1-5 GM/200ML-% IV SOLN
1000.0000 mg | INTRAVENOUS | Status: DC
  Administered 2017-11-12 – 2017-11-13 (×2): 1000 mg via INTRAVENOUS
  Filled 2017-11-12 (×2): qty 200

## 2017-11-12 MED ORDER — MORPHINE SULFATE (PF) 4 MG/ML IV SOLN
2.0000 mg | INTRAVENOUS | Status: DC | PRN
  Administered 2017-11-12 – 2017-11-14 (×7): 2 mg via INTRAVENOUS
  Filled 2017-11-12 (×7): qty 1

## 2017-11-12 MED ORDER — MAGNESIUM SULFATE 4 GM/100ML IV SOLN
4.0000 g | Freq: Once | INTRAVENOUS | Status: AC
  Administered 2017-11-12: 4 g via INTRAVENOUS
  Filled 2017-11-12: qty 100

## 2017-11-12 NOTE — Progress Notes (Signed)
PROGRESS NOTE    [name]  DGL:[id number] DOB: 04-08-1956 DOA: 11/05/2017 PCP: Nolene Ebbs, MD   Brief Narrative:  HPI on 11/06/2017 by Dr. Jennette Kettle  [name] is a 62 y.o. female with medical history significant of tobacco alcohol and cocaine use, CKD stage III, hyperlipidemia, hypertension, uncontrolled diabetes, history of right upper extremity DVT, history of hepatitis C. 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 Patient was most recently admitted from 4/1 to 4/11 for acute blood loss anemia and hemorrhagic shock.  Initially believed to be due to bleeding from trach site but ultimately determined to be due to abdominal wall hematoma in the R lower abdomen.  Coumadin held initially, patient transfused, and coumadin resumed on discharge. Today patient sent in to ED from SNF after serial hemoglobins (being performed since last admit) declined to 7.1 today. Patient is unable to provide any history herself due to being in persistent vegetative state.  Interim history Admitted for anemia and blood loss. Seen by gastroenterology- recommended conservative management, hold anticoagulation.  Assessment & Plan   Acute blood loss anemia/on chronic anemia, GI bleed -Baseline hemoglobin approximately 9-10, presented with a hemoglobin of 8.7 (was 7.1 prior to admission) -FOBT + -Patient transfused 2 units PRBC, hemoglobin currently 8.5 (currently stable) -CT abdomen pelvis showed no evidence of acute retroperitoneal hematoma.  3.4 cm resolving hematoma/seroma over right  flank.  Mild patchy right lower lobe opacities. -Gastroenterology consulted and appreciated, recommended avoiding anticoagulants if possible, transfuse as needed, supportive care, palliative care consultation  -Given multiple coomorbidities and in a vegetative state, IVC filter would not be of benefit -Continue to monitor CBC  Acute kidney injury on chronic kidney disease, stage III with uremia -Resolved -Creatinine upon admission 2.48 (baseline 1.2-1.4) -Creatinine today 1 -Continue IVF, and received [facility] -EDP discussed with Dr. Joelyn Oms, nephrologist, suggested IV fluids and transfusion, does not need to be transferred to [facility] at this time. -Monitor intake and output closely -Continue to monitor BMP  Hypernatremia/hyprechloremia -Sodium continues to be elevated despite changing IV fluids -Sodium mildly improved today, down to 148 -Chloride improving, 112 today -continue IVF and free water flushes -monitor BMP  Nonanion gap metabolic acidosis -Resolved, likely secondary to hypernatremia/hyperchloremia -bicarb 24 -continue to monitor   Sepsis secondary to RLL HCAP vs bacteremia -Upon admission, CXR reviewed and shows possible area of consolidation right lung base -patient has now developed fever of 100.70F along with persistent leukocytosis -Blood cultures show no growth -Sputum culture shows rare WBC present (PMN), rare GPC, moderate culture consistent with normal respiratory flora -Strep pneumonia urine antigen negative -Obtain repeat chest x-ray which did show improvement; unremarkable for infection -UA and urine culture unremarkable for infection -Repeat Blood cultures from 11/08/2017: 1/2 GPC: methicillin resistant staph species  -continue cefepime and vancomycin -repeat blood cultures on 11/11/17 show no growth to date -continues to spike fevers- could this be secondary response to pain? Have added IV morphine for pain control. Discussed with RN, patient does grimace with  turns or wound care.  Hypomagnesemia -magnesium 1.5 despite replacement, will give 4g IV today and continue to monitor   Diabetes mellitus, type II -CBGs have been uncontrolled- however,  patient also was on D5w (discontinued) -peg feeds restarted -continue Lantus to 24 units twice daily, patient was taking 40 units twice daily prior to admission -Continue insulin sliding scale to moderate, Novolog 3u q4h, and CBG monitoring -CBGs appear to be better controlled  Essential hypertension -Continue amlodipine, clonidine, hydralazine, labetalol  Persistent vegetative state -Chronic  History of right upper extremity DVT -Coumadin held due to anemia (INR 2.44 on admission)  Decubitus ulcer, stage III -present on admission -Wound care consulted, recommended "Foam dressing to right posterior thigh to protect from moisture and absorb drainage.  Santyl for enzymatic debridement of nonviable tissue to sacrum and foam dressing to surrounding skin on buttocks."  Goals of care -Have consulted palliative care to discuss goals of care and code status -Called daughter along with palliative care to discuss goals of care and CODE STATUS.  Patient's daughter very apprehensive regarding making decisions regarding her mother.  Would like to wait a few weeks until she is able to come see her mother.  This has been ongoing for several months, and no family has been to see the patient.  Patient has been hospitalized 5 times in the last 6 months.  Discussed about patient's current status will not improve, the brain damage that has been done as permanent.  Discussed that patient is not responsive to pain stimuli.  Discussed that patient could be in pain given her current vital signs, and that comfort measures would be recommended.  Daughter would like to obtain patient's medical records (although daughter asked for this information detailed in email, discussed that this is against HIPAA regulations and that she should  go through medical records to obtain documentation regarding her mother). -Palliative care will follow the patient once discharged back to nursing facility and try to stay in contact with patient's daughter for updates. -Daughter requested patient remains full code at this time.  DVT Prophylaxis  SCDs  Code Status: Full  Family Communication: None at bedside.  Disposition Plan: Admitted. Pending improvement. Suspect SNF at discharge  Consultants Gastroenterology Nephrology  Palliative care  Procedures  none  Antibiotics   Anti-infectives (From admission, onward)   Start     Dose/Rate Route Frequency Ordered Stop   11/09/17 2000  ceFEPIme (MAXIPIME) 1 g in sodium chloride 0.9 % 100 mL IVPB     1 g 200 mL/hr over 30 Minutes Intravenous Every 8 hours 11/09/17 1326     11/09/17 1200  vancomycin (VANCOCIN) 1,250 mg in sodium chloride 0.9 % 250 mL IVPB     1,250 mg 166.7 mL/hr over 90 Minutes Intravenous Every 24 hours 11/09/17 1105     11/08/17 0600  vancomycin (VANCOCIN) IVPB 1000 mg/200 mL premix  Status:  Discontinued     1,000 mg 200 mL/hr over 60 Minutes Intravenous Every 48 hours 11/06/17 0514 11/07/17 1343   11/07/17 1800  vancomycin (VANCOCIN) IVPB 1000 mg/200 mL premix  Status:  Discontinued     1,000 mg 200 mL/hr over 60 Minutes Intravenous Every 36 hours 11/07/17 1343 11/07/17 1423   11/07/17 1330  ceFEPIme (MAXIPIME) 1 g in sodium chloride 0.9 % 100 mL IVPB  Status:  Discontinued     1 g 200 mL/hr over 30 Minutes Intravenous Every 12 hours 11/07/17 1320 11/09/17 1326   11/06/17 2000  ceFEPIme (MAXIPIME) 1 g in sodium chloride 0.9 % 100 mL IVPB  Status:  Discontinued     1 g 200 mL/hr over 30 Minutes Intravenous Every 24 hours 11/06/17 0514 11/07/17  1320   11/06/17 0015  vancomycin (VANCOCIN) 2,000 mg in sodium chloride 0.9 % 500 mL IVPB     2,000 mg 250 mL/hr over 120 Minutes Intravenous  Once 11/05/17 2349 11/06/17 0331   11/06/17 0000  ceFEPIme (MAXIPIME) 2 g in  sodium chloride 0.9 % 100 mL IVPB     2 g 200 mL/hr over 30 Minutes Intravenous NOW 11/05/17 2349 11/06/17 0129      Subjective:   [name] seen and examined today.  Chronic vegetative state.    Objective:   Vitals:   11/12/17 0600 11/12/17 0800 11/12/17 1000 11/12/17 1050  BP: (!) 178/116 (!) 161/68 (!) 176/77 (!) 176/77  Pulse: (!) 102 97 97   Resp: (!) 25 (!) 26 (!) 25   Temp:  100.3 F (37.9 C)    TempSrc:  Axillary    SpO2: 99% 100% 100%   Weight: 84.9 kg (187 lb 2.7 oz)     Height:        Intake/Output Summary (Last 24 hours) at 11/12/2017 1059 Last data filed at 11/12/2017 0600 Gross per 24 hour  Intake 6068 ml  Output 6000 ml  Net 68 ml   Filed Weights   11/10/17 0500 11/11/17 1100 11/12/17 0600  Weight: 79.7 kg (175 lb 11.3 oz) 84.6 kg (186 lb 8.2 oz) 84.9 kg (187 lb 2.7 oz)   Exam  General: Well developed, chronically ill appearing, NAD  HEENT: NCAT,mucous membranes moist.   Neck:Trach  Cardiovascular: S1 S2 auscultated, no rubs, murmurs or gallops. Regular rate and rhythm.  Respiratory:Diminished breath sounds, no wheezing   Abdomen: Soft, nontender, nondistended, + bowel sounds, +peg  Extremities: warm dry without cyanosis clubbing or edema  Neuro: Eyes open. Chronic vegetative state  Data Reviewed: I have personally reviewed following labs and imaging studies  CBC: Recent Labs  Lab 11/05/17 2200  11/08/17 0603 11/09/17 0319 11/10/17 0311 11/11/17 0332 11/12/17 0344  WBC 18.6*   < > 16.5* 18.4* 18.4* 17.9* 17.3*  NEUTROABS 14.2*  --   --   --   --   --   --   HGB 7.1*   < > 8.7* 8.5* 8.2* 8.7* 8.5*  HCT 22.6*   < > 28.2* 27.2* 26.4* 28.7* 27.1*  MCV 95.0   < > 94.3 94.8 96.7 97.0 96.8  PLT 255   < > 212 188 179 165 157   < > = values in this interval not displayed.   Basic Metabolic Panel: Recent Labs  Lab 11/07/17 1430 11/07/17 1650 11/08/17 0603 11/08/17 1650 11/09/17 0319 11/10/17 0311 11/11/17 0332 11/12/17 0344    NA  --   --  156*  --  160* 154* 151* 148*  K  --   --  3.9  --  3.9 3.7 3.5 3.5  CL  --   --  128*  --  128* 120* 115* 112*  CO2  --   --  17*  --  21* 22 25 24   GLUCOSE  --   --  425*  --  204* 242* 166* 120*  BUN  --   --  104*  --  80* 61* 48* 49*  CREATININE  --   --  1.28*  --  1.08* 1.13* 0.93 1.00  CALCIUM  --   --  9.1  --  8.9 8.5* 8.6* 8.3*  MG 2.0 2.0 1.7 1.5*  --  1.7 1.5* 1.5*  PHOS 3.3 3.4 2.6 2.8  --   --   --   --  GFR: Estimated Creatinine Clearance: 65.6 mL/min (by C-G formula based on SCr of 1 mg/dL). Liver Function Tests: No results for input(s): AST, ALT, ALKPHOS, BILITOT, PROT, ALBUMIN in the last 168 hours. No results for input(s): LIPASE, AMYLASE in the last 168 hours. No results for input(s): AMMONIA in the last 168 hours. Coagulation Profile: Recent Labs  Lab 11/05/17 2200  INR 2.44   Cardiac Enzymes: No results for input(s): CKTOTAL, CKMB, CKMBINDEX, TROPONINI in the last 168 hours. BNP (last 3 results) No results for input(s): PROBNP in the last 8760 hours. HbA1C: No results for input(s): HGBA1C in the last 72 hours. CBG: Recent Labs  Lab 11/11/17 1538 11/11/17 2006 11/12/17 0016 11/12/17 0342 11/12/17 0809  GLUCAP 176* 109* 159* 96 91   Lipid Profile: No results for input(s): CHOL, HDL, LDLCALC, TRIG, CHOLHDL, LDLDIRECT in the last 72 hours. Thyroid Function Tests: No results for input(s): TSH, T4TOTAL, FREET4, T3FREE, THYROIDAB in the last 72 hours. Anemia Panel: No results for input(s): VITAMINB12, FOLATE, FERRITIN, TIBC, IRON, RETICCTPCT in the last 72 hours. Urine analysis:    Component Value Date/Time   COLORURINE YELLOW 11/08/2017 [place] 11/08/2017 1046   LABSPEC 1.012 11/08/2017 1046   PHURINE 6.0 11/08/2017 1046   GLUCOSEU >=500 (A) 11/08/2017 1046   GLUCOSEU > 1000 mg/dL (A) 11/01/2007 2056   HGBUR NEGATIVE 11/08/2017 1046   HGBUR negative 07/15/2007 0954   BILIRUBINUR NEGATIVE 11/08/2017 1046    KETONESUR NEGATIVE 11/08/2017 1046   PROTEINUR 30 (A) 11/08/2017 1046   UROBILINOGEN 1.0 04/09/2012 1524   NITRITE NEGATIVE 11/08/2017 1046   LEUKOCYTESUR TRACE (A) 11/08/2017 1046   Sepsis Labs: @LABRCNTIP (procalcitonin:4,lacticidven:4)  ) Recent Results (from the past 240 hour(s))  Blood culture (routine x 2)     Status: None   Collection Time: 11/06/17 12:14 AM  Result Value Ref Range Status   Specimen Description   Final    BLOOD LEFT ANTECUBITAL Performed at [facility], [place] [address], [place], [place] [address]    Special Requests   Final    BOTTLES DRAWN AEROBIC AND ANAEROBIC Blood Culture adequate volume Performed at [place] [address] [place], [place] [address]    Culture   Final    NO GROWTH 5 DAYS Performed at [facility], [place] [address], [place], [place] [address]    Report Status 11/11/2017 FINAL  Final  Blood culture (routine x 2)     Status: None   Collection Time: 11/06/17 12:33 AM  Result Value Ref Range Status   Specimen Description   Final    BLOOD RIGHT FOREARM Performed at [facility] [place] [address], [place], [place] [address]    Special Requests   Final    BOTTLES DRAWN AEROBIC AND ANAEROBIC Blood Culture adequate volume Performed at [place] [address] [place], [place] [address]    Culture   Final    NO GROWTH 5 DAYS Performed at [facility], [place] [address], [place], [place] [address]    Report Status 11/11/2017 FINAL  Final  Culture, respiratory (NON-Expectorated)     Status: None   Collection Time: 11/06/17  7:45 AM  Result Value Ref Range Status   Specimen Description   Final    TRACHEAL ASPIRATE Performed at [facility] [address], [place],  [address]    Special Requests   Final    Normal Performed at [facility], [place] [address] [place], [place] [address]    Gram Stain    Final    RARE WBC PRESENT, PREDOMINANTLY PMN RARE GRAM POSITIVE COCCI    Culture   Final    MODERATE Consistent with normal respiratory flora. Performed at [facility], [place] [address], [place], [place] [address]    Report Status 11/08/2017 FINAL  Final  MRSA PCR Screening     Status: None   Collection Time: 11/06/17 11:17 PM  Result Value Ref Range Status   MRSA by PCR NEGATIVE NEGATIVE Final    Comment:        The GeneXpert MRSA Assay (FDA approved for NASAL specimens only), is one component of a comprehensive MRSA colonization surveillance program. It is not intended to diagnose MRSA infection nor to guide or monitor treatment for MRSA infections. Performed at [facility], [place] [address] [place], [place] [address]   Culture, Urine     Status: None   Collection Time: 11/08/17 10:47 AM  Result Value Ref Range Status   Specimen Description   Final    URINE, CLEAN CATCH Performed at [facility], [place] [address], [place], [place] [address]    Special Requests   Final    NONE Performed at [facility], [place] [address] [place], [place] [address]    Culture   Final    NO GROWTH Performed at [facility], [place] [address], [place], [place] [address]    Report Status 11/09/2017 FINAL  Final  Culture, blood (routine x 2)     Status: None (Preliminary result)   Collection Time: 11/08/17 12:12 PM  Result Value Ref Range Status   Specimen Description BLOOD LEFT HAND  Final   Special Requests   Final    BOTTLES DRAWN AEROBIC AND ANAEROBIC Blood Culture adequate volume   Culture   Final    NO GROWTH 4 DAYS Performed at [facility], [place] [address], [place], [place] [address]    Report Status PENDING  Incomplete  Culture, blood (routine x 2)     Status: Abnormal   Collection Time: 11/08/17 12:16 PM  Result Value Ref Range Status   Specimen Description   Final    BLOOD RIGHT HAND Performed at [place] [address] [place], [place] [address]    Special Requests   Final    BOTTLES DRAWN AEROBIC ONLY Blood Culture results may not be optimal due to an inadequate volume of blood received in culture bottles Performed at [facility] [address] [place], [place] [address]    Culture  Setup Time   Final    AEROBIC BOTTLE ONLY GRAM POSITIVE COCCI CRITICAL RESULT CALLED TO, READ BACK BY AND VERIFIED WITH: B GREEN PHARMD 11/10/17 0202 JDW    Culture (A)  Final    STAPHYLOCOCCUS SPECIES (COAGULASE NEGATIVE) THE SIGNIFICANCE OF ISOLATING THIS ORGANISM FROM A SINGLE SET OF BLOOD CULTURES WHEN MULTIPLE SETS ARE DRAWN IS UNCERTAIN. PLEASE NOTIFY THE MICROBIOLOGY DEPARTMENT WITHIN ONE WEEK IF SPECIATION AND SENSITIVITIES ARE REQUIRED. Performed at [facility], [place] [address] [place],  [address]    Report Status 11/11/2017 FINAL  Final  Blood Culture ID Panel (Reflexed)     Status: Abnormal   Collection Time: 11/08/17 12:16 PM  Result Value Ref Range Status   Enterococcus species NOT DETECTED NOT DETECTED Final   Listeria monocytogenes NOT DETECTED  NOT DETECTED Final   Staphylococcus species DETECTED (A) NOT DETECTED Final    Comment: Methicillin (oxacillin) resistant coagulase negative staphylococcus. Possible blood culture contaminant (unless isolated from more than one blood culture draw or clinical case suggests pathogenicity). No antibiotic treatment is indicated for blood  culture contaminants. CRITICAL RESULT CALLED TO, READ BACK BY AND VERIFIED WITH: B GREEN PHARMD 11/10/17 0202 JDW    Staphylococcus aureus NOT DETECTED NOT DETECTED Final   Methicillin resistance DETECTED (A) NOT DETECTED Final    Comment: CRITICAL RESULT CALLED TO, READ BACK BY AND VERIFIED WITH: B GREEN PHARMD 11/10/17 0202 JDW    Streptococcus species NOT DETECTED NOT DETECTED Final   Streptococcus agalactiae NOT DETECTED NOT DETECTED Final   Streptococcus  pneumoniae NOT DETECTED NOT DETECTED Final   Streptococcus pyogenes NOT DETECTED NOT DETECTED Final   Acinetobacter baumannii NOT DETECTED NOT DETECTED Final   Enterobacteriaceae species NOT DETECTED NOT DETECTED Final   Enterobacter cloacae complex NOT DETECTED NOT DETECTED Final   Escherichia coli NOT DETECTED NOT DETECTED Final   Klebsiella oxytoca NOT DETECTED NOT DETECTED Final   Klebsiella pneumoniae NOT DETECTED NOT DETECTED Final   Proteus species NOT DETECTED NOT DETECTED Final   Serratia marcescens NOT DETECTED NOT DETECTED Final   Haemophilus influenzae NOT DETECTED NOT DETECTED Final   Neisseria meningitidis NOT DETECTED NOT DETECTED Final   Pseudomonas aeruginosa NOT DETECTED NOT DETECTED Final   Candida albicans NOT DETECTED NOT DETECTED Final   Candida glabrata NOT DETECTED NOT DETECTED Final   Candida krusei NOT DETECTED NOT DETECTED Final   Candida parapsilosis NOT DETECTED NOT DETECTED Final   Candida tropicalis NOT DETECTED NOT DETECTED Final  Culture, blood (routine x 2)     Status: None (Preliminary result)   Collection Time: 11/11/17 11:54 AM  Result Value Ref Range Status   Specimen Description   Final    BLOOD RIGHT HAND Performed at [name], [place] [address], [place], [place] [address]    Special Requests   Final    BOTTLES DRAWN AEROBIC AND ANAEROBIC Blood Culture adequate volume Performed at [place] [address], [place], [place] [address]    Culture   Final    NO GROWTH < 24 HOURS Performed at [facility] [address], [place], [place] [address]    Report Status PENDING  Incomplete  Culture, blood (routine x 2)     Status: None (Preliminary result)   Collection Time: 11/11/17 11:57 AM  Result Value Ref Range Status   Specimen Description   Final    BLOOD RIGHT HAND Performed at [place] [address], [place], [place] [address]    Special Requests   Final     BOTTLES DRAWN AEROBIC ONLY Blood Culture adequate volume Performed at [facility] [address], [place], [place] [address]    Culture   Final    NO GROWTH < 24 HOURS Performed at [place] [address] [place], [place] [address]    Report Status PENDING  Incomplete      Radiology Studies: [place] Ekg Site Rite  Result Date: 11/10/2017 If Site Rite image not attached, placement could not be confirmed due to current cardiac rhythm.    Scheduled Meds: . amLODipine  10 mg Per Tube Daily  . atorvastatin  40 mg Per Tube q1800  . cloNIDine  0.2 mg Per Tube TID  . collagenase   Topical  Daily  . feeding supplement (PRO-STAT SUGAR FREE 64)  30 mL Per Tube BID  . folic acid  1 mg Per Tube Daily  . free water  400 mL Per Tube Q6H  . hydrALAZINE  100 mg Per Tube Q8H  . insulin aspart  0-15 Units Subcutaneous Q4H  . insulin aspart  3 Units Subcutaneous Q4H  . insulin glargine  24 Units Subcutaneous BID  . labetalol  400 mg Per Tube TID  . lacosamide  200 mg Per Tube BID  . levETIRAcetam  1,000 mg Per Tube BID  . multivitamin  15 mL Per Tube Daily  . pantoprazole  40 mg Intravenous Q12H  . phenytoin  250 mg Oral BID  . scopolamine  1 patch Transdermal Q72H  . thiamine  100 mg Per Tube Daily  . topiramate  200 mg Per Tube BID   Continuous Infusions: . ceFEPime (MAXIPIME) IV Stopped (11/12/17 0452)  . feeding supplement (GLUCERNA 1.2 CAL) 1,000 mL (11/12/17 0155)  . magnesium sulfate 1 - 4 g bolus IVPB    .  sodium bicarbonate (isotonic) infusion in sterile water 75 mL/hr at 11/11/17 2141  . vancomycin Stopped (11/11/17 1500)     LOS: 6 days   Time Spent in minutes   45 minutes   Ciena Sampley D.O. on 11/12/2017 at 10:59 AM  Between 7am to 7pm - Pager - [phone]  After 7pm go to [email or web address] - password [email or web address]  And look for the night coverage person covering for me after hours  [facility] Office  [phone]

## 2017-11-12 NOTE — Progress Notes (Signed)
Pharmacy Antibiotic Note  [name] is a 62 y.o. female admitted on 11/05/2017 with pneumonia.  Pharmacy is consulted for cefepime and vancomycin dosing.  Today, 11/12/2017: SCr 1 WBC 17.3 Tmax 100.9  Plan:  Continue Cefepime 1g IV q8h  Decrease to Vancomycin 1000 mg IV q24h.  Check vancomycin levels if remains on vancomycin > 3-4 days.  Goal AUC 400-500.  Follow up renal fxn, culture results, and clinical course.  F/u ability to de-escalate antibiotics.   Height: 5\' 6"  (167.6 cm) Weight: 187 lb 2.7 oz (84.9 kg) IBW/kg (Calculated) : 59.3  Temp (24hrs), Avg:100.1 F (37.8 C), Min:99.5 F (37.5 C), Max:100.9 F (38.3 C)  Recent Labs  Lab 11/05/17 2208  11/08/17 0603 11/09/17 0319 11/10/17 0311 11/11/17 0332 11/12/17 0344  WBC  --    < > 16.5* 18.4* 18.4* 17.9* 17.3*  CREATININE  --    < > 1.28* 1.08* 1.13* 0.93 1.00  LATICACIDVEN 0.94  --   --   --   --   --   --    < > = values in this interval not displayed.    Estimated Creatinine Clearance: 65.6 mL/min (by C-G formula based on SCr of 1 mg/dL).    No Known Allergies  Antimicrobials this admission: 4/30 cefepime >>  4/30 vancomycin >> 4/30, resumed 5/3 >>  Dose adjustments this admission: 5/1 Cefepime 1gm from q24h to q12h 5/3 Cefepime 1gm from q12 to q8h  Microbiology results: 4/30 BCx: ngtd 4/30 CDiff: neg/neg 4/30 Trach asp: normal flora, final 4/30 MRSA PCR: neg 5/2 UCx: ng-final 5/2 BCx: 1 set: ngtd, 1 set: Coag Neg Staph    5/4 BCID: Staph species, methicillin resistance detected. 5/5 BCx: ngtd  Thank you for allowing pharmacy to be a part of this patient's care.  Gretta Arab PharmD, BCPS Pager [phone] 11/12/2017 11:17 AM

## 2017-11-12 NOTE — Progress Notes (Signed)
Nutrition Follow-up  DOCUMENTATION CODES:   Not applicable  INTERVENTION:  - Continue current TF regimen which is over 20 hours d/t Dilantin per PEG BID. This regimen is providing 1880 kcal, 114 grams of protein, and 1127 mL free water.  - Free water flush to continue to be per MD given hypernatremia and adjustments with IVF.   NUTRITION DIAGNOSIS:   Increased nutrient needs related to wound healing as evidenced by estimated needs. -ongoing  GOAL:   Patient will meet greater than or equal to 90% of their needs -met with TF regimen   MONITOR:   TF tolerance, Weight trends, Labs, Skin  ASSESSMENT:   62 y.o. female with medical history significant of tobacco alcohol and cocaine use, CKD stage III, hyperlipidemia, hypertension, uncontrolled diabetes, history of right upper extremity DVT, history of hepatitis C.  Weight trending up since admission and is currently +17 lbs/7.5 kg compared to weight on 5/2. Free water flush of 400 mL QID added on 5/3 (1600 mL/day). Pt with trach and PEG. She is receiving Glucerna 1.2 @ 70 mL/hr x20 hours with 30 mL Prostat BID and 400 mL free water QID. This regimen is providing   Per Dr. Aldine Contes note this AM: pt was FOBT positive, CT abd/pelvis showed no evidence of acute retroperitoneal hematoma. Palliative care saw pt on 5/1 and Dr. Aldine Contes note outlines information concerning this; pt remains full code at this time with plan for Palliative to continue to follow once pt returns to the nursing home. Sodium continues to be elevated and IVF and free water flushes in place to help with this. Metabolic acidosis has resolved.   Medications reviewed; 1 mg folic acid per PEG/day, sliding scale Novolog, 3 units Novolog every 4 hours, 24 units Lantus BID, 4 g IV Mg sulfate x1 run today, 15 mL liquid multivitamin per PEG/day, 40 mg IV Protonix BID.  Labs reviewed; CBGs: 159, 96, 91, and 160 mg/dL today, Na: 148 mmol/L (trending down), Cl: 112 mmol/L, BUN: 49  mg/dL, Ca: 8.3 mg/dL, Mg: 1.5 mg/dL.  IVF: 150 mEq sodium bicarb in sterile water @ 75 mL/hr.     Diet Order:   Diet Order    None      EDUCATION NEEDS:   No education needs have been identified at this time  Skin:  Skin Assessment: Skin Integrity Issues: Skin Integrity Issues:: Incisions Unstageable: full thickness to coccyx Incisions: R hip (5/1)  Last BM:  5/6  Height:   Ht Readings from Last 1 Encounters:  11/05/17 '5\' 6"'$  (1.676 m)    Weight:   Wt Readings from Last 1 Encounters:  11/12/17 187 lb 2.7 oz (84.9 kg)    Ideal Body Weight:  59.09 kg  BMI:  Body mass index is 30.21 kg/m.  Estimated Nutritional Needs:   Kcal:  1850-2090  Protein:  105-120 grams  Fluid:  >/= 1.8 L/day      Jarome Matin, MS, RD, LDN, [facility] Inpatient Clinical Dietitian Pager # [phone] After hours/weekend pager # [phone]

## 2017-11-12 NOTE — Plan of Care (Signed)
  Problem: Elimination: Goal: Will not experience complications related to bowel motility Outcome: Progressing   Problem: Nutrition: Goal: Adequate nutrition will be maintained Outcome: Progressing   Problem: Pain Managment: Goal: General experience of comfort will improve Outcome: Progressing   

## 2017-11-13 LAB — GLUCOSE, CAPILLARY
Glucose-Capillary: 112 mg/dL — ABNORMAL HIGH (ref 65–99)
Glucose-Capillary: 191 mg/dL — ABNORMAL HIGH (ref 65–99)
Glucose-Capillary: 246 mg/dL — ABNORMAL HIGH (ref 65–99)
Glucose-Capillary: 201 mg/dL — ABNORMAL HIGH (ref 65–99)
Glucose-Capillary: 122 mg/dL — ABNORMAL HIGH (ref 65–99)
Glucose-Capillary: 186 mg/dL — ABNORMAL HIGH (ref 65–99)

## 2017-11-13 LAB — BASIC METABOLIC PANEL
ANION GAP: 10 (ref 5–15)
BUN: 43 mg/dL — ABNORMAL HIGH (ref 6–20)
CALCIUM: 8.2 mg/dL — AB (ref 8.9–10.3)
CO2: 25 mmol/L (ref 22–32)
Chloride: 108 mmol/L (ref 101–111)
Creatinine, Ser: 1 mg/dL (ref 0.44–1.00)
GFR calc Af Amer: >60 mL/min (ref >60)
GFR, EST NON AFRICAN AMERICAN: 60 mL/min — AB (ref >60)
GLUCOSE: 130 mg/dL — AB (ref 65–99)
Potassium: 3.6 mmol/L (ref 3.5–5.1)
Sodium: 143 mmol/L (ref 135–145)

## 2017-11-13 LAB — CBC
HEMATOCRIT: 24.1 % — AB (ref 36.0–46.0)
Hemoglobin: 7.7 g/dL — ABNORMAL LOW (ref 12.0–15.0)
MCH: 30.8 pg (ref 26.0–34.0)
MCHC: 32 g/dL (ref 30.0–36.0)
MCV: 96.4 fL (ref 78.0–100.0)
PLATELETS: 170 10*3/uL (ref 150–400)
RBC: 2.5 MIL/uL — ABNORMAL LOW (ref 3.87–5.11)
RDW: 16.7 % — AB (ref 11.5–15.5)
WBC: 16.8 10*3/uL — ABNORMAL HIGH (ref 4.0–10.5)

## 2017-11-13 LAB — HEMOGLOBIN AND HEMATOCRIT, BLOOD
HCT: 28.1 % — ABNORMAL LOW (ref 36.0–46.0)
Hemoglobin: 8.8 g/dL — ABNORMAL LOW (ref 12.0–15.0)

## 2017-11-13 LAB — CULTURE, BLOOD (ROUTINE X 2)
CULTURE: NO GROWTH
SPECIAL REQUESTS: ADEQUATE

## 2017-11-13 LAB — MAGNESIUM: Magnesium: 2.2 mg/dL (ref 1.7–2.4)

## 2017-11-13 NOTE — Progress Notes (Signed)
Pt given 650mg  tylenol for axillary temp of 101.2, will continue to monitor

## 2017-11-13 NOTE — Progress Notes (Signed)
PROGRESS NOTE    Alexyia Guarino  FAO:[id number] DOB: 1956-01-16 DOA: 11/05/2017 PCP: Nolene Ebbs, MD   Brief Narrative:  HPI on 11/06/2017 by Dr. Jennette Kettle  [place]anga Alvelo is a 62 y.o. female with medical history significant of tobacco alcohol and cocaine use, CKD stage III, hyperlipidemia, hypertension, uncontrolled diabetes, history of right upper extremity DVT, history of hepatitis C. 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 Patient was most recently admitted from 4/1 to 4/11 for acute blood loss anemia and hemorrhagic shock.  Initially believed to be due to bleeding from trach site but ultimately determined to be due to abdominal wall hematoma in the R lower abdomen.  Coumadin held initially, patient transfused, and coumadin resumed on discharge. Today patient sent in to ED from SNF after serial hemoglobins (being performed since last admit) declined to 7.1 today. Patient is unable to provide any history herself due to being in persistent vegetative state.  Interim history Admitted for anemia and blood loss. Seen by gastroenterology- recommended conservative management, hold anticoagulation.  Despite further work-up, no other source of infection found.  Patient continues to spike fevers.  Will discuss with infectious disease.  Assessment & Plan   Acute blood loss anemia/on chronic anemia, GI bleed -Baseline hemoglobin approximately 9-10, presented with a hemoglobin of 8.7 (was 7.1 prior to admission) -FOBT +  -Patient transfused 2 units PRBC, hemoglobin currently 7.7-  pending repeat H/H for later today -CT abdomen pelvis showed no evidence of acute retroperitoneal hematoma.  3.4 cm resolving hematoma/seroma over right flank.  Mild patchy right lower lobe opacities. -Gastroenterology consulted and appreciated, recommended avoiding anticoagulants if possible, transfuse as needed, supportive care, palliative care consultation  -Given multiple coomorbidities and in a vegetative state, IVC filter would not be of benefit -Continue to monitor CBC  Acute kidney injury on chronic kidney disease, stage III with uremia -Resolved -Creatinine upon admission 2.48 (baseline 1.2-1.4) -Creatinine today 1 -Continue IVF, and received [facility] -EDP discussed with Dr. Joelyn Oms, nephrologist, suggested IV fluids and transfusion, does not need to be transferred to [facility] at this time. -Monitor intake and output closely -Continue to monitor BMP  Hypernatremia/hyprechloremia -Sodium continues to be elevated despite changing IV fluids -Sodium mildly improved today, down to 143 -Chloride improving, 108 today -continue IVF and free water flushes -monitor BMP  Nonanion gap metabolic acidosis -Resolved, likely secondary to hypernatremia/hyperchloremia -bicarb 24 -continue to monitor   Sepsis secondary to RLL HCAP -Upon admission, CXR reviewed and shows possible area of consolidation right lung base -patient has now developed fever of 100.3F along with persistent leukocytosis -Blood cultures show no growth -Sputum culture shows rare WBC present (PMN), rare GPC, moderate culture consistent with normal respiratory flora -Strep pneumonia urine antigen negative -Obtain repeat chest x-ray which did show improvement; unremarkable for infection -UA and urine culture unremarkable for infection -Repeat Blood cultures from 11/08/2017: 1/2 GPC: methicillin resistant staph species- SCN (likely contaminant)  -Completed course of cefepime and vancomycin -repeat blood cultures on 11/11/17  show no growth to date -continues to spike fevers- could this be secondary response to pain? Have added IV morphine for pain control. Discussed with RN, patient does grimace with  turns or wound care. -Discussed with Dr. Novella Olive, infectious disease, will give patient an antibiotic holiday  Hypomagnesemia -Resolved with repletion, magnesium currently 2.2  Diabetes mellitus, type II -CBGs have been uncontrolled- however, patient also was on D5w (discontinued) -peg feeds restarted -continue Lantus to 24 units twice daily, patient was taking 40 units twice daily prior to admission -Continue insulin sliding scale to moderate, Novolog 3u q4h, and CBG monitoring CBGs appear to be well-controlled  Essential hypertension -Continue amlodipine, clonidine, hydralazine, labetalol -Patient has had some uncontrolled hypertension.  Question if this is due to pain.  Started pain medication on 11/12/2017, BP better controlled  Persistent vegetative state -Chronic  History of right upper extremity DVT -Coumadin held due to anemia (INR 2.44 on admission)  Decubitus ulcer, stage III -present on admission -Wound care consulted, recommended "Foam dressing to right posterior thigh to protect from moisture and absorb drainage.  Santyl for enzymatic debridement of nonviable tissue to sacrum and foam dressing to surrounding skin on buttocks."  Goals of care -Have consulted palliative care to discuss goals of care and code status -Called daughter along with palliative care to discuss goals of care and CODE STATUS.  Patient's daughter very apprehensive regarding making decisions regarding her mother.  Would like to wait a few weeks until she is able to come see her mother.  This has been ongoing for several months, and no family has been to see the patient.  Patient has been hospitalized 5 times in the last 6 months.  Discussed about patient's current status will not improve, the brain damage that has been done as  permanent.  Discussed that patient is not responsive to pain stimuli.  Discussed that patient could be in pain given her current vital signs, and that comfort measures would be recommended.  Daughter would like to obtain patient's medical records (although daughter asked for this information detailed in email, discussed that this is against HIPAA regulations and that she should go through medical records to obtain documentation regarding her mother). -Palliative care will follow the patient once discharged back to nursing facility and try to stay in contact with patient's daughter for updates. -Daughter requested patient remains full code at this time.  DVT Prophylaxis  SCDs  Code Status: Full  Family Communication: None at bedside.  Disposition Plan: Admitted.  Continue to monitor fever. Suspect SNF at discharge when stable  Consultants Gastroenterology Nephrology  Palliative care Infectious disease via phone, Dr. Linus Salmons  Procedures  none  Antibiotics   Anti-infectives (From admission, onward)   Start     Dose/Rate Route Frequency Ordered Stop   11/12/17 1400  vancomycin (VANCOCIN) IVPB 1000 mg/200 mL premix     1,000 mg 200 mL/hr over 60 Minutes Intravenous Every 24 hours 11/12/17 1134     11/09/17 2000  ceFEPIme (MAXIPIME) 1 g in sodium chloride 0.9 % 100 mL IVPB     1 g 200 mL/hr over 30 Minutes Intravenous Every 8 hours 11/09/17 1326     11/09/17 1200  vancomycin (VANCOCIN) 1,250 mg in sodium chloride 0.9 % 250 mL IVPB  Status:  Discontinued     1,250 mg 166.7 mL/hr over 90 Minutes Intravenous Every 24 hours 11/09/17 1105 11/12/17 1134   11/08/17 0600  vancomycin (VANCOCIN) IVPB 1000 mg/200 mL premix  Status:  Discontinued     1,000 mg 200 mL/hr over 60 Minutes Intravenous Every 48 hours 11/06/17 0514 11/07/17 1343   11/07/17 1800  vancomycin (VANCOCIN) IVPB 1000 mg/200 mL premix  Status:  Discontinued     1,000 mg 200 mL/hr over 60 Minutes Intravenous Every 36 hours  11/07/17 1343 11/07/17 1423   11/07/17 1330  ceFEPIme (MAXIPIME) 1 g in sodium chloride 0.9 % 100 mL IVPB  Status:  Discontinued     1 g 200 mL/hr over 30 Minutes Intravenous Every 12 hours 11/07/17 1320 11-12-17 1326   11/06/17 2000  ceFEPIme (MAXIPIME) 1 g in sodium chloride 0.9 % 100 mL IVPB  Status:  Discontinued     1 g 200 mL/hr over 30 Minutes Intravenous Every 24 hours 11/06/17 0514 11/07/17 1320   11/06/17 0015  vancomycin (VANCOCIN) 2,000 mg in sodium chloride 0.9 % 500 mL IVPB     2,000 mg 250 mL/hr over 120 Minutes Intravenous  Once 11/05/17 2349 11/06/17 0331   11/06/17 0000  ceFEPIme (MAXIPIME) 2 g in sodium chloride 0.9 % 100 mL IVPB     2 g 200 mL/hr over 30 Minutes Intravenous NOW 11/05/17 2349 11/06/17 0129      Subjective:   Mckyla Plemmons seen and examined today.  Chronic vegetative state.    Objective:   Vitals:   11/13/17 0200 11/13/17 0309 11/13/17 0400 11/13/17 0740  BP: 138/60  (!) 127/48   Pulse: 86  88   Resp: 18  19   Temp:  99.4 F (37.4 C)  100.2 F (37.9 C)  TempSrc:  Oral  Axillary  SpO2: 99%  98%   Weight:      Height:        Intake/Output Summary (Last 24 hours) at 11/13/2017 1111 Last data filed at 11/13/2017 1000 Gross per 24 hour  Intake 4802.5 ml  Output 2850 ml  Net 1952.5 ml   Filed Weights   11/10/17 0500 11/11/17 1100 11/12/17 0600  Weight: 79.7 kg (175 lb 11.3 oz) 84.6 kg (186 lb 8.2 oz) 84.9 kg (187 lb 2.7 oz)   Exam  General: Well developed, chronically ill-appearing, NAD  HEENT: NCAT, mucous membranes moist.   Neck: Trach  Cardiovascular: S1 S2 auscultated, no rubs, murmurs or gallops. Regular rate and rhythm.  Respiratory: Diminished breath sounds, no wheezing  Abdomen: Soft, nontender, nondistended, + bowel sounds, +peg  Extremities: warm dry without cyanosis clubbing or edema  Neuro: Chronic vegetative state  Data Reviewed: I have personally reviewed following labs and imaging studies  CBC: Recent Labs    Lab Nov 12, 2017 0319 11/10/17 0311 11/11/17 0332 11/12/17 0344 11/13/17 0353  WBC 18.4* 18.4* 17.9* 17.3* 16.8*  HGB 8.5* 8.2* 8.7* 8.5* 7.7*  HCT 27.2* 26.4* 28.7* 27.1* 24.1*  MCV 94.8 96.7 97.0 96.8 96.4  PLT 188 179 165 157 431   Basic Metabolic Panel: Recent Labs  Lab 11/07/17 1430 11/07/17 1650 11/08/17 0603 11/08/17 1650 12-Nov-2017 0319 11/10/17 0311 11/11/17 0332 11/12/17 0344 11/13/17 0353  NA  --   --  156*  --  160* 154* 151* 148* 143  K  --   --  3.9  --  3.9 3.7 3.5 3.5 3.6  CL  --   --  128*  --  128* 120* 115* 112* 108  CO2  --   --  17*  --  21* 22 25 24 25   GLUCOSE  --   --  425*  --  204* 242* 166* 120* 130*  BUN  --   --  104*  --  80* 61* 48* 49* 43*  CREATININE  --   --  1.28*  --  1.08* 1.13* 0.93  1.00 1.00  CALCIUM  --   --  9.1  --  8.9 8.5* 8.6* 8.3* 8.2*  MG 2.0 2.0 1.7 1.5*  --  1.7 1.5* 1.5* 2.2  PHOS 3.3 3.4 2.6 2.8  --   --   --   --   --    GFR: Estimated Creatinine Clearance: 65.6 mL/min (by C-G formula based on SCr of 1 mg/dL). Liver Function Tests: No results for input(s): AST, ALT, ALKPHOS, BILITOT, PROT, ALBUMIN in the last 168 hours. No results for input(s): LIPASE, AMYLASE in the last 168 hours. No results for input(s): AMMONIA in the last 168 hours. Coagulation Profile: No results for input(s): INR, PROTIME in the last 168 hours. Cardiac Enzymes: No results for input(s): CKTOTAL, CKMB, CKMBINDEX, TROPONINI in the last 168 hours. BNP (last 3 results) No results for input(s): PROBNP in the last 8760 hours. HbA1C: No results for input(s): HGBA1C in the last 72 hours. CBG: Recent Labs  Lab 11/12/17 1601 11/12/17 1913 11/12/17 2315 11/13/17 0308 11/13/17 0800  GLUCAP 124* 80 86 112* 191*   Lipid Profile: No results for input(s): CHOL, HDL, LDLCALC, TRIG, CHOLHDL, LDLDIRECT in the last 72 hours. Thyroid Function Tests: No results for input(s): TSH, T4TOTAL, FREET4, T3FREE, THYROIDAB in the last 72 hours. Anemia Panel: No  results for input(s): VITAMINB12, FOLATE, FERRITIN, TIBC, IRON, RETICCTPCT in the last 72 hours. Urine analysis:    Component Value Date/Time   COLORURINE YELLOW 11/08/2017 [place] 11/08/2017 1046   LABSPEC 1.012 11/08/2017 1046   PHURINE 6.0 11/08/2017 1046   GLUCOSEU >=500 (A) 11/08/2017 1046   GLUCOSEU > 1000 mg/dL (A) 11/01/2007 2056   HGBUR NEGATIVE 11/08/2017 1046   HGBUR negative 07/15/2007 0954   BILIRUBINUR NEGATIVE 11/08/2017 1046   KETONESUR NEGATIVE 11/08/2017 1046   PROTEINUR 30 (A) 11/08/2017 1046   UROBILINOGEN 1.0 04/09/2012 1524   NITRITE NEGATIVE 11/08/2017 1046   LEUKOCYTESUR TRACE (A) 11/08/2017 1046   Sepsis Labs: @LABRCNTIP (procalcitonin:4,lacticidven:4)  ) Recent Results (from the past 240 hour(s))  Blood culture (routine x 2)     Status: None   Collection Time: 11/06/17 12:14 AM  Result Value Ref Range Status   Specimen Description   Final    BLOOD LEFT ANTECUBITAL Performed at [facility], [place] [address] [place], [place] [address]    Special Requests   Final    BOTTLES DRAWN AEROBIC AND ANAEROBIC Blood Culture adequate volume Performed at [place] [address] [place], [place] [address]    Culture   Final    NO GROWTH 5 DAYS Performed at [facility], [place] [address], [place], [place] [address]    Report Status 11/11/2017 FINAL  Final  Blood culture (routine x 2)     Status: None   Collection Time: 11/06/17 12:33 AM  Result Value Ref Range Status   Specimen Description   Final    BLOOD RIGHT FOREARM Performed at [place] [address], [place], [place] [address]    Special Requests   Final    BOTTLES DRAWN AEROBIC AND ANAEROBIC Blood Culture adequate volume Performed at [place] [address], [place], [place] [address]    Culture   Final    NO GROWTH 5 DAYS Performed at [facility], [place] [address],  [place], [place] [address]    Report Status 11/11/2017 FINAL  Final  Culture, respiratory (NON-Expectorated)     Status:  None   Collection Time: 11/06/17  7:45 AM  Result Value Ref Range Status   Specimen Description   Final    TRACHEAL ASPIRATE Performed at [place] [address], [place], [place] [address]    Special Requests   Final    Normal Performed at [facility], [place] [address] [place] [address] [place], [place] [address]    Gram Stain   Final    RARE WBC PRESENT, PREDOMINANTLY PMN RARE GRAM POSITIVE COCCI    Culture   Final    MODERATE Consistent with normal respiratory flora. Performed at [facility], [place] [address] [place], [place] [address]    Report Status 11/08/2017 FINAL  Final  MRSA PCR Screening     Status: None   Collection Time: 11/06/17 11:17 PM  Result Value Ref Range Status   MRSA by PCR NEGATIVE NEGATIVE Final    Comment:        The GeneXpert MRSA Assay (FDA approved for NASAL specimens only), is one component of a comprehensive MRSA colonization surveillance program. It is not intended to diagnose MRSA infection nor to guide or monitor treatment for MRSA infections. Performed at [facility], [place] [address], [place], [place] [address]   Culture, Urine     Status: None   Collection Time: 11/08/17 10:47 AM  Result Value Ref Range Status   Specimen Description   Final    URINE, CLEAN CATCH Performed at [facility], [place] [address] [place], [place] [address]    Special Requests   Final    NONE Performed at [facility], [place] [address] [place], [place] [address]    Culture   Final    NO GROWTH Performed at [facility], [place] [address], [place], [place] [address]    Report Status 11/09/2017 FINAL  Final  Culture, blood (routine x 2)     Status: None (Preliminary result)   Collection Time: 11/08/17 12:12 PM  Result Value Ref Range Status   Specimen  Description BLOOD LEFT HAND  Final   Special Requests   Final    BOTTLES DRAWN AEROBIC AND ANAEROBIC Blood Culture adequate volume   Culture   Final    NO GROWTH 4 DAYS Performed at [facility], [place] [address] [place], [place] [address]    Report Status PENDING  Incomplete  Culture, blood (routine x 2)     Status: Abnormal   Collection Time: 11/08/17 12:16 PM  Result Value Ref Range Status   Specimen Description   Final    BLOOD RIGHT HAND Performed at [place] [address], [place], [place] [address]    Special Requests   Final    BOTTLES DRAWN AEROBIC ONLY Blood Culture results may not be optimal due to an inadequate volume of blood received in culture bottles Performed at [facility] [facility] [address] [place], [place] [address]    Culture  Setup Time   Final    AEROBIC BOTTLE ONLY GRAM POSITIVE COCCI CRITICAL RESULT CALLED TO, READ BACK BY AND VERIFIED WITH: B GREEN PHARMD 11/10/17 0202 JDW    Culture (A)  Final    STAPHYLOCOCCUS SPECIES (COAGULASE NEGATIVE) THE SIGNIFICANCE OF ISOLATING THIS ORGANISM FROM A SINGLE SET OF BLOOD CULTURES WHEN MULTIPLE SETS ARE DRAWN IS UNCERTAIN. PLEASE NOTIFY THE MICROBIOLOGY DEPARTMENT WITHIN ONE WEEK IF SPECIATION AND SENSITIVITIES ARE REQUIRED. Performed at [facility], [address] [place], [place] [address]    Report Status 11/11/2017 FINAL  Final  Blood Culture ID Panel (Reflexed)     Status: Abnormal   Collection Time: 11/08/17 12:16 PM  Result Value Ref Range Status   Enterococcus species NOT DETECTED NOT DETECTED Final   Listeria monocytogenes NOT DETECTED NOT DETECTED Final   Staphylococcus species DETECTED (A) NOT DETECTED Final    Comment: Methicillin (oxacillin) resistant coagulase negative staphylococcus. Possible blood culture contaminant (unless isolated from more than one blood culture draw or clinical case suggests pathogenicity). No antibiotic treatment is indicated  for blood  culture contaminants. CRITICAL RESULT CALLED TO, READ BACK BY AND VERIFIED WITH: B GREEN PHARMD 11/10/17 0202 JDW    Staphylococcus aureus NOT DETECTED NOT DETECTED Final   Methicillin resistance DETECTED (A) NOT DETECTED Final    Comment: CRITICAL RESULT CALLED TO, READ BACK BY AND VERIFIED WITH: B GREEN PHARMD 11/10/17 0202 JDW    Streptococcus species NOT DETECTED NOT DETECTED Final   Streptococcus agalactiae NOT DETECTED NOT DETECTED Final   Streptococcus pneumoniae NOT DETECTED NOT DETECTED Final   Streptococcus pyogenes NOT DETECTED NOT DETECTED Final   Acinetobacter baumannii NOT DETECTED NOT DETECTED Final   Enterobacteriaceae species NOT DETECTED NOT DETECTED Final   Enterobacter cloacae complex NOT DETECTED NOT DETECTED Final   Escherichia coli NOT DETECTED NOT DETECTED Final   Klebsiella oxytoca NOT DETECTED NOT DETECTED Final   Klebsiella pneumoniae NOT DETECTED NOT DETECTED Final   Proteus species NOT DETECTED NOT DETECTED Final   Serratia marcescens NOT DETECTED NOT DETECTED Final   Haemophilus influenzae NOT DETECTED NOT DETECTED Final   Neisseria meningitidis NOT DETECTED NOT DETECTED Final   Pseudomonas aeruginosa NOT DETECTED NOT DETECTED Final   Candida albicans NOT DETECTED NOT DETECTED Final   Candida glabrata NOT DETECTED NOT DETECTED Final   Candida krusei NOT DETECTED NOT DETECTED Final   Candida parapsilosis NOT DETECTED NOT DETECTED Final   Candida tropicalis NOT DETECTED NOT DETECTED Final  Culture, blood (routine x 2)     Status: None (Preliminary result)   Collection Time: 11/11/17 11:54 AM  Result Value Ref Range Status   Specimen Description   Final    BLOOD RIGHT HAND Performed at [facility], [place] [address], [place], [place] [address]    Special Requests   Final    BOTTLES DRAWN AEROBIC AND ANAEROBIC Blood Culture adequate volume Performed at [place] [address], [place], [place] [address]     Culture   Final    NO GROWTH < 24 HOURS Performed at [place] [address] [place], [place] [address]    Report Status PENDING  Incomplete  Culture, blood (routine x 2)     Status: None (Preliminary result)   Collection Time: 11/11/17 11:57 AM  Result Value Ref Range Status   Specimen Description   Final    BLOOD RIGHT HAND Performed at [place] [address] [place], [place] [address]    Special Requests   Final    BOTTLES DRAWN AEROBIC ONLY Blood Culture adequate volume Performed at [place] [address] [place], [place] [address]    Culture   Final    NO GROWTH < 24 HOURS Performed at [place] [address], [place], [place] [address]    Report Status PENDING  Incomplete      Radiology Studies: No results found.   Scheduled  Meds: . amLODipine  10 mg Per Tube Daily  . atorvastatin  40 mg Per Tube q1800  . cloNIDine  0.2 mg Per Tube TID  . collagenase   Topical Daily  . feeding supplement (PRO-STAT SUGAR FREE 64)  30 mL Per Tube BID  . folic acid  1 mg Per Tube Daily  . free water  400 mL Per Tube Q6H  . hydrALAZINE  100 mg Per Tube Q8H  . insulin aspart  0-15 Units Subcutaneous Q4H  . insulin aspart  3 Units Subcutaneous Q4H  . insulin glargine  24 Units Subcutaneous BID  . labetalol  400 mg Per Tube TID  . lacosamide  200 mg Per Tube BID  . levETIRAcetam  1,000 mg Per Tube BID  . multivitamin  15 mL Per Tube Daily  . pantoprazole  40 mg Intravenous Q12H  . phenytoin  250 mg Oral BID  . scopolamine  1 patch Transdermal Q72H  . thiamine  100 mg Per Tube Daily  . topiramate  200 mg Per Tube BID   Continuous Infusions: . ceFEPime (MAXIPIME) IV 1 g (11/13/17 0508)  . feeding supplement (GLUCERNA 1.2 CAL) 70 mL/hr at 11/13/17 1000  .  sodium bicarbonate (isotonic) infusion in sterile water 75 mL/hr at 11/13/17 1000  . vancomycin Stopped (11/12/17 1500)     LOS: 7 days   Time Spent  in minutes   45 minutes   Tildon Silveria D.O. on 11/13/2017 at 11:11 AM  Between 7am to 7pm - Pager - [phone]  After 7pm go to [email or web address] - password [email or web address]  And look for the night coverage person covering for me after hours  [facility] Office  [phone]

## 2017-11-14 LAB — GLUCOSE, CAPILLARY
GLUCOSE-CAPILLARY: 112 mg/dL — AB (ref 65–99)
GLUCOSE-CAPILLARY: 138 mg/dL — AB (ref 65–99)
GLUCOSE-CAPILLARY: 99 mg/dL (ref 65–99)
Glucose-Capillary: 161 mg/dL — ABNORMAL HIGH (ref 65–99)
Glucose-Capillary: 156 mg/dL — ABNORMAL HIGH (ref 65–99)
Glucose-Capillary: 105 mg/dL — ABNORMAL HIGH (ref 65–99)

## 2017-11-14 LAB — BASIC METABOLIC PANEL
Anion gap: 13 (ref 5–15)
BUN: 41 mg/dL — AB (ref 6–20)
CO2: 25 mmol/L (ref 22–32)
CREATININE: 1.01 mg/dL — AB (ref 0.44–1.00)
Calcium: 8.3 mg/dL — ABNORMAL LOW (ref 8.9–10.3)
Chloride: 101 mmol/L (ref 101–111)
GFR calc Af Amer: >60 mL/min (ref >60)
GFR, EST NON AFRICAN AMERICAN: 59 mL/min — AB (ref >60)
Glucose, Bld: 176 mg/dL — ABNORMAL HIGH (ref 65–99)
Potassium: 3.8 mmol/L (ref 3.5–5.1)
SODIUM: 139 mmol/L (ref 135–145)

## 2017-11-14 LAB — CBC
HCT: 25.3 % — ABNORMAL LOW (ref 36.0–46.0)
Hemoglobin: 8.1 g/dL — ABNORMAL LOW (ref 12.0–15.0)
MCH: 30.8 pg (ref 26.0–34.0)
MCHC: 32 g/dL (ref 30.0–36.0)
MCV: 96.2 fL (ref 78.0–100.0)
PLATELETS: 212 10*3/uL (ref 150–400)
RBC: 2.63 MIL/uL — ABNORMAL LOW (ref 3.87–5.11)
RDW: 16.3 % — AB (ref 11.5–15.5)
WBC: 15.7 10*3/uL — ABNORMAL HIGH (ref 4.0–10.5)

## 2017-11-14 LAB — PHENYTOIN LEVEL, TOTAL: PHENYTOIN LVL: 8.8 ug/mL — AB (ref 10.0–20.0)

## 2017-11-14 NOTE — Progress Notes (Signed)
PROGRESS NOTE    [name]  ZJI:[id number] DOB: 1956-06-30 DOA: 11/05/2017 PCP: Nolene Ebbs, MD     Brief Narrative:  HPI on 11/06/2017 by Dr. Jennette Kettle  [name] a 62 y.o.femalewith medical history significant oftobacco alcohol and cocaine use, CKD stage III, hyperlipidemia, hypertension, uncontrolled diabetes, history of right upper extremity DVT, history of hepatitisC.  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 Patient was most recently admitted from 4/1 to 4/11 for acute blood loss anemia and hemorrhagic shock. Initially believed to be due to bleeding from trach site but ultimately determined to be due to abdominal wall hematoma in the R lower abdomen. Coumadin held initially, patient transfused, and coumadin resumed on discharge. Now patient sent in to ED from SNF after serial hemoglobins (being performed since last admit) declined to 7.1. Patient is unable to provide any history herself due to being in persistent vegetative state.  Interim history Admitted for anemia and blood loss. Seen by gastroenterology who recommended conservative management, hold anticoagulation. Despite further work-up, no other source of infection found. Case was discussed with infectious disease who recommended antibiotic holiday.   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  3.4 cm resolving hematoma/seroma over right flank.  Mild patchy right lower lobe opacities. -Gastroenterology consulted and appreciated, recommended avoiding anticoagulants if possible, transfuse as needed, supportive care, palliative care consultation  -Given multiple coomorbidities and in a vegetative state, IVC filter would not be of benefit -Continue to monitor CBC  Acute kidney injury on chronic kidney disease, stage III with uremia -Creatinine upon admission 2.48 (baseline 1.2-1.4) -EDP discussed with Dr. Joelyn Oms, nephrologist, suggested IV fluids and transfusion, does not need to be transferred to [facility] at this time -Resolved and remains stable   Hypernatremia/hyperchloremia -Resolved   Nonanion gap metabolic acidosis -Resolved   Sepsis secondary to RLL HCAP -Upon admission, CXR reviewed and shows possible area of consolidation right lung base -Patient has been febrile along with persistent leukocytosis -Sputum culture shows rare WBC present (PMN), rare GPC, moderate culture consistent with normal respiratory flora -Strep pneumonia urine antigen negative -Obtain repeat chest x-ray which did show improvement; unremarkable for infection -UA and urine culture unremarkable for infection -Repeat Blood cultures from 11/08/2017: 1/2 GPC: methicillin resistant staph species- SCN (likely contaminant)  -Completed course of cefepime and vancomycin -Repeat blood  cultures on 11/11/17 show no growth to date -Continues to spike fevers- could this be secondary response to pain? Have added IV morphine for pain control -  Dr. Ree Kida discussed with Dr. Novella Olive, infectious disease, will give patient an antibiotic holiday -Continue to monitor fever, treat with tylenol and pain medication prn   Diabetes mellitus, type II -Lantus, Novolog, SSI   Essential hypertension -Continue amlodipine, clonidine, hydralazine, labetalol  HLD -Continue lipitor   Persistent vegetative state -Chronic -Trach and PEG in place, also on vimpat, keppra, dilantin, topamax   History of right upper extremity DVT -Coumadin held due to anemia   Decubitus ulcer, stage III, POA  -Wound care consulted, recommended "Foam dressing to right posterior thigh to protect from moisture and absorb drainage. Santyl for enzymatic debridement of nonviable tissue to sacrum and foam dressing to surrounding skin on buttocks."  Goals of care -Dr. Ree Kida and palliative care discussed goals of care with daughter over the phone. Patient's daughter very apprehensive regarding making decisions regarding her mother.  Would like to wait a few weeks until she is able to come see her mother.  This has been ongoing for several months, and no family has been to see the patient.  Patient has been hospitalized 5 times in the last 6 months.  Discussed about patient's current status will not improve, the brain damage that has been done as permanent.  Discussed that patient is not responsive to pain stimuli.  Discussed that patient could be in pain given her current vital signs, and that comfort measures would be recommended.  Daughter would like to obtain patient's medical records. -Palliative care will follow the patient once discharged back to nursing facility and try to stay in contact with patient's daughter for updates. -Daughter requested patient remains full code at this time.   DVT prophylaxis:  SCD Code Status: Full Family Communication: No family at bedside Disposition Plan: Back to SNF when stable, continue to watch fever and leukocytosis    Consultants:  Gastroenterology Nephrology  Palliative care Infectious disease via phone, Dr. Linus Salmons  Procedures:   None   Antimicrobials:  Anti-infectives (From admission, onward)   Start     Dose/Rate Route Frequency Ordered Stop   11/12/17 1400  vancomycin (VANCOCIN) IVPB 1000 mg/200 mL premix  Status:  Discontinued     1,000 mg 200 mL/hr over 60 Minutes Intravenous Every 24 hours 11/12/17 1134 11/13/17 1502   11/09/17 2000  ceFEPIme (MAXIPIME) 1 g in sodium chloride 0.9 % 100 mL IVPB  Status:  Discontinued     1 g 200 mL/hr over 30 Minutes Intravenous Every 8 hours 11/09/17 1326 11/13/17 1502   11/09/17 1200  vancomycin (VANCOCIN) 1,250 mg in sodium chloride 0.9 % 250 mL IVPB  Status:  Discontinued     1,250 mg 166.7 mL/hr over 90 Minutes Intravenous Every 24 hours 11/09/17 1105 11/12/17 1134   11/08/17 0600  vancomycin (VANCOCIN) IVPB 1000 mg/200 mL premix  Status:  Discontinued     1,000 mg 200 mL/hr over 60 Minutes Intravenous Every 48 hours 11/06/17 0514 11/07/17 1343   11/07/17 1800  vancomycin (VANCOCIN) IVPB 1000 mg/200 mL premix  Status:  Discontinued     1,000 mg 200 mL/hr over 60 Minutes Intravenous Every 36 hours 11/07/17 1343 11/07/17 1423   11/07/17 1330  ceFEPIme (MAXIPIME) 1 g in sodium chloride 0.9 % 100 mL IVPB  Status:  Discontinued     1 g 200 mL/hr over 30 Minutes Intravenous Every 12 hours 11/07/17 1320 11/09/17 1326   11/06/17 2000  ceFEPIme (MAXIPIME) 1 g in sodium chloride 0.9 % 100 mL IVPB  Status:  Discontinued  1 g 200 mL/hr over 30 Minutes Intravenous Every 24 hours 11/06/17 0514 11/07/17 1320   11/06/17 0015  vancomycin (VANCOCIN) 2,000 mg in sodium chloride 0.9 % 500 mL IVPB     2,000 mg 250 mL/hr over 120 Minutes Intravenous  Once 11/05/17 2349 11/06/17 0331   11/06/17 0000  ceFEPIme  (MAXIPIME) 2 g in sodium chloride 0.9 % 100 mL IVPB     2 g 200 mL/hr over 30 Minutes Intravenous NOW 11/05/17 2349 11/06/17 0129       Subjective: Patient non-verbal and non-interactive on exam   Objective: Vitals:   11/14/17 0400 11/14/17 0500 11/14/17 0600 11/14/17 0800  BP: (!) 151/67  (!) 172/74   Pulse: 92 93 93   Resp: (!) 23 (!) 23 (!) 23   Temp:    100.3 F (37.9 C)  TempSrc:    Axillary  SpO2: 98% 98% 98%   Weight:  86.4 kg (190 lb 7.6 oz)    Height:        Intake/Output Summary (Last 24 hours) at 11/14/2017 0851 Last data filed at 11/14/2017 0600 Gross per 24 hour  Intake 3467.67 ml  Output 3775 ml  Net -307.33 ml   Filed Weights   11/12/17 0600 11/13/17 1120 11/14/17 0500  Weight: 84.9 kg (187 lb 2.7 oz) 85.3 kg (188 lb 0.8 oz) 86.4 kg (190 lb 7.6 oz)    Examination:  General exam: Appears calm and comfortable  Respiratory system: +Coarse breath sounds, +Trach  Cardiovascular system: S1 & S2 heard, RRR. No JVD, murmurs, rubs, gallops or clicks. No pedal edema. Gastrointestinal system: Abdomen is nondistended, soft and nontender. No organomegaly or masses felt. Normal bowel sounds heard. +PEG  Central nervous system: Alert, nonverbal, does not track and does not follow commands  Extremities: Symmetric  Psychiatry: +Persistant vegetative state   Data Reviewed: I have personally reviewed following labs and imaging studies  CBC: Recent Labs  Lab 11/10/17 0311 11/11/17 0332 11/12/17 0344 11/13/17 0353 11/13/17 1158 11/14/17 0316  WBC 18.4* 17.9* 17.3* 16.8*  --  15.7*  HGB 8.2* 8.7* 8.5* 7.7* 8.8* 8.1*  HCT 26.4* 28.7* 27.1* 24.1* 28.1* 25.3*  MCV 96.7 97.0 96.8 96.4  --  96.2  PLT 179 165 157 170  --  341   Basic Metabolic Panel: Recent Labs  Lab 11/07/17 1430 11/07/17 1650 11/08/17 0603 11/08/17 1650  11/10/17 0311 11/11/17 0332 11/12/17 0344 11/13/17 0353 11/14/17 0316  NA  --   --  156*  --    < > 154* 151* 148* 143 139  K  --   --   3.9  --    < > 3.7 3.5 3.5 3.6 3.8  CL  --   --  128*  --    < > 120* 115* 112* 108 101  CO2  --   --  17*  --    < > 22 25 24 25 25   GLUCOSE  --   --  425*  --    < > 242* 166* 120* 130* 176*  BUN  --   --  104*  --    < > 61* 48* 49* 43* 41*  CREATININE  --   --  1.28*  --    < > 1.13* 0.93 1.00 1.00 1.01*  CALCIUM  --   --  9.1  --    < > 8.5* 8.6* 8.3* 8.2* 8.3*  MG 2.0 2.0 1.7 1.5*  --  1.7 1.5* 1.5*  2.2  --   PHOS 3.3 3.4 2.6 2.8  --   --   --   --   --   --    < > = values in this interval not displayed.   GFR: Estimated Creatinine Clearance: 65.6 mL/min (A) (by C-G formula based on SCr of 1.01 mg/dL (H)). Liver Function Tests: No results for input(s): AST, ALT, ALKPHOS, BILITOT, PROT, ALBUMIN in the last 168 hours. No results for input(s): LIPASE, AMYLASE in the last 168 hours. No results for input(s): AMMONIA in the last 168 hours. Coagulation Profile: No results for input(s): INR, PROTIME in the last 168 hours. Cardiac Enzymes: No results for input(s): CKTOTAL, CKMB, CKMBINDEX, TROPONINI in the last 168 hours. BNP (last 3 results) No results for input(s): PROBNP in the last 8760 hours. HbA1C: No results for input(s): HGBA1C in the last 72 hours. CBG: Recent Labs  Lab 11/13/17 1546 11/13/17 1949 11/13/17 2312 11/14/17 0316 11/14/17 0813  GLUCAP 201* 122* 186* 161* 156*   Lipid Profile: No results for input(s): CHOL, HDL, LDLCALC, TRIG, CHOLHDL, LDLDIRECT in the last 72 hours. Thyroid Function Tests: No results for input(s): TSH, T4TOTAL, FREET4, T3FREE, THYROIDAB in the last 72 hours. Anemia Panel: No results for input(s): VITAMINB12, FOLATE, FERRITIN, TIBC, IRON, RETICCTPCT in the last 72 hours. Sepsis Labs: No results for input(s): PROCALCITON, LATICACIDVEN in the last 168 hours.  Recent Results (from the past 240 hour(s))  Blood culture (routine x 2)     Status: None   Collection Time: 11/06/17 12:14 AM  Result Value Ref Range Status   Specimen Description    Final    BLOOD LEFT ANTECUBITAL Performed at [facility] [address] [place], [place] [address]    Special Requests   Final    BOTTLES DRAWN AEROBIC AND ANAEROBIC Blood Culture adequate volume Performed at [facility] [address] [place], [place] [address]    Culture   Final    NO GROWTH 5 DAYS Performed at [facility], [place] [address], [place], [place] [address]    Report Status 11/11/2017 FINAL  Final  Blood culture (routine x 2)     Status: None   Collection Time: 11/06/17 12:33 AM  Result Value Ref Range Status   Specimen Description   Final    BLOOD RIGHT FOREARM Performed at [facility] [address] [place], [place] [address]    Special Requests   Final    BOTTLES DRAWN AEROBIC AND ANAEROBIC Blood Culture adequate volume Performed at [facility] [address] [place], [place] [address]    Culture   Final    NO GROWTH 5 DAYS Performed at [facility], [place] [address], [place], [place] [address]    Report Status 11/11/2017 FINAL  Final  Culture, respiratory (NON-Expectorated)     Status: None   Collection Time: 11/06/17  7:45 AM  Result Value Ref Range Status   Specimen Description   Final    TRACHEAL ASPIRATE Performed at [facility] [address], [place], [place] [address]    Special Requests   Final    Normal Performed at [facility], [place] [address] [place], [place] [address]    Gram Stain   Final    RARE WBC PRESENT, PREDOMINANTLY PMN RARE GRAM POSITIVE COCCI    Culture   Final    MODERATE Consistent with normal respiratory flora. Performed at [facility]  [facility], [place] [address] [place], [place] [address]    Report Status 11/08/2017 FINAL  Final  MRSA PCR Screening     Status: None   Collection Time: 11/06/17 11:17 PM  Result Value Ref Range Status   MRSA by PCR NEGATIVE NEGATIVE Final    Comment:          The GeneXpert MRSA Assay (FDA approved for NASAL specimens only), is one component of a comprehensive MRSA colonization surveillance program. It is not intended to diagnose MRSA infection nor to guide or monitor treatment for MRSA infections. Performed at [facility], [place] [address], [place], [place] [address]   Culture, Urine     Status: None   Collection Time: 11/08/17 10:47 AM  Result Value Ref Range Status   Specimen Description   Final    URINE, CLEAN CATCH Performed at [facility], [place] [address] [place], [place] [address]    Special Requests   Final    NONE Performed at [facility], [place] [address], [place], [place] [address]    Culture   Final    NO GROWTH Performed at [facility], [place] [address] [place], [place] [address]    Report Status 11/09/2017 FINAL  Final  Culture, blood (routine x 2)     Status: None   Collection Time: 11/08/17 12:12 PM  Result Value Ref Range Status   Specimen Description BLOOD LEFT HAND  Final   Special Requests   Final    BOTTLES DRAWN AEROBIC AND ANAEROBIC Blood Culture adequate volume   Culture   Final    NO GROWTH 5 DAYS Performed at [facility], [place] [address], [place], [place] [address]    Report Status 11/13/2017 FINAL  Final  Culture, blood (routine x 2)     Status: Abnormal   Collection Time: 11/08/17 12:16 PM  Result Value Ref Range Status   Specimen Description   Final    BLOOD RIGHT HAND Performed at [facility] [address], [place], [place] [address]    Special Requests   Final    BOTTLES DRAWN AEROBIC ONLY Blood Culture results may not be optimal due to an inadequate volume of blood received in culture bottles Performed at [facility] [address], [place], [place] [address]    Culture  Setup Time   Final    AEROBIC BOTTLE ONLY GRAM POSITIVE COCCI CRITICAL RESULT CALLED TO, READ BACK BY AND  VERIFIED WITH: B GREEN PHARMD 11/10/17 0202 JDW    Culture (A)  Final    STAPHYLOCOCCUS SPECIES (COAGULASE NEGATIVE) THE SIGNIFICANCE OF ISOLATING THIS ORGANISM FROM A SINGLE SET OF BLOOD CULTURES WHEN MULTIPLE SETS ARE DRAWN IS UNCERTAIN. PLEASE NOTIFY THE MICROBIOLOGY DEPARTMENT WITHIN ONE WEEK IF SPECIATION AND SENSITIVITIES ARE REQUIRED. Performed at [facility], [place] [address], [place],  [address]    Report Status 11/11/2017 FINAL  Final  Blood Culture ID Panel (Reflexed)     Status: Abnormal   Collection Time: 11/08/17 12:16 PM  Result Value Ref Range Status   Enterococcus species NOT DETECTED NOT DETECTED Final   Listeria monocytogenes NOT DETECTED NOT DETECTED Final   Staphylococcus species DETECTED (A) NOT DETECTED Final    Comment: Methicillin (oxacillin) resistant coagulase negative staphylococcus. Possible blood culture contaminant (unless isolated from more than one blood culture draw or clinical case suggests pathogenicity). No antibiotic treatment is indicated for blood  culture contaminants. CRITICAL RESULT CALLED TO, READ BACK BY AND VERIFIED WITH: B GREEN PHARMD 11/10/17 0202 JDW    Staphylococcus aureus NOT DETECTED NOT DETECTED Final   Methicillin resistance DETECTED (A) NOT DETECTED Final    Comment: CRITICAL RESULT CALLED TO, READ BACK BY AND VERIFIED WITH: B GREEN PHARMD 11/10/17 0202 JDW    Streptococcus species NOT DETECTED NOT DETECTED Final   Streptococcus agalactiae NOT DETECTED NOT DETECTED Final   Streptococcus pneumoniae NOT DETECTED NOT DETECTED Final   Streptococcus pyogenes NOT DETECTED NOT DETECTED Final   Acinetobacter baumannii NOT DETECTED NOT DETECTED Final   Enterobacteriaceae species NOT DETECTED NOT DETECTED Final   Enterobacter cloacae complex NOT DETECTED NOT DETECTED Final   Escherichia coli NOT DETECTED NOT DETECTED Final   Klebsiella oxytoca NOT DETECTED NOT DETECTED Final   Klebsiella pneumoniae NOT DETECTED NOT DETECTED Final     Proteus species NOT DETECTED NOT DETECTED Final   Serratia marcescens NOT DETECTED NOT DETECTED Final   Haemophilus influenzae NOT DETECTED NOT DETECTED Final   Neisseria meningitidis NOT DETECTED NOT DETECTED Final   Pseudomonas aeruginosa NOT DETECTED NOT DETECTED Final   Candida albicans NOT DETECTED NOT DETECTED Final   Candida glabrata NOT DETECTED NOT DETECTED Final   Candida krusei NOT DETECTED NOT DETECTED Final   Candida parapsilosis NOT DETECTED NOT DETECTED Final   Candida tropicalis NOT DETECTED NOT DETECTED Final  Culture, blood (routine x 2)     Status: None (Preliminary result)   Collection Time: 11/11/17 11:54 AM  Result Value Ref Range Status   Specimen Description   Final    BLOOD RIGHT HAND Performed at [facility], [place] [address], [place], [place] [address]    Special Requests   Final    BOTTLES DRAWN AEROBIC AND ANAEROBIC Blood Culture adequate volume Performed at [facility] [address] [place], [place] [address]    Culture   Final    NO GROWTH 2 DAYS Performed at [facility] [address], [place], [place] [address]    Report Status PENDING  Incomplete  Culture, blood (routine x 2)     Status: None (Preliminary result)   Collection Time: 11/11/17 11:57 AM  Result Value Ref Range Status   Specimen Description   Final    BLOOD RIGHT HAND Performed at [facility] [address], [place], [place] [address]    Special Requests   Final    BOTTLES DRAWN AEROBIC ONLY Blood Culture adequate volume Performed at [facility] [address] [place], [place] [address]    Culture   Final    NO GROWTH 2 DAYS Performed at [facility] [address] [place], [place] [address]    Report Status PENDING  Incomplete       Radiology Studies: No results found.    Scheduled Meds: . amLODipine  10 mg Per Tube Daily  . atorvastatin  40 mg Per Tube q1800  .  cloNIDine  0.2 mg Per Tube TID  . collagenase   Topical Daily  . feeding supplement (PRO-STAT SUGAR FREE 64)  30 mL Per Tube BID  . folic acid  1 mg Per Tube Daily  . free water  400 mL Per Tube Q6H  . hydrALAZINE  100 mg Per Tube Q8H  . insulin aspart  0-15 Units Subcutaneous Q4H  . insulin aspart  3 Units Subcutaneous Q4H  . insulin glargine  24 Units  Subcutaneous BID  . labetalol  400 mg Per Tube TID  . lacosamide  200 mg Per Tube BID  . levETIRAcetam  1,000 mg Per Tube BID  . multivitamin  15 mL Per Tube Daily  . pantoprazole  40 mg Intravenous Q12H  . phenytoin  250 mg Oral BID  . scopolamine  1 patch Transdermal Q72H  . thiamine  100 mg Per Tube Daily  . topiramate  200 mg Per Tube BID   Continuous Infusions: . feeding supplement (GLUCERNA 1.2 CAL) Stopped (11/14/17 0824)     LOS: 8 days    Time spent: 35 minutes   Dessa Phi, DO [facility] [email or web address] Password [facility] 11/14/2017, 8:51 AM

## 2017-11-14 NOTE — Progress Notes (Addendum)
Palliative:  No changes noted with [name]'s condition. I did reach out to daughter, [name], to continue our discussion. I explained to [name] that we have been monitoring [name]'s fevers which have been thought to be possibly related to pain. [name] further asks about [name]'s overall condition and responsiveness. I explain that [name] overall condition is unchanged and that we do not expect any improvement at this stage. [name] struggles to understand [name]'s official diagnosis that has left [name] in this state. I explained that [name] has had severe brain damage from strokes and seizures that have caused permanent damage. [name] becomes very tearful.   [name] explains that she feels very alone in making this decision (has a sister but [name] sister does not want anything to do with [name]). [name] states that [name] sister believes [name] "did this to herself." [name] shares that [name] killed [name] father when she was 86 yo and that she only met [name] last year. [name] becomes very tearful and feels [name] began using cocaine when she moved away last year. I explained to [name] that [name] has been + cocaine in our records since 2009 and there is clearly nothing she could have done as this has been a longstanding problem.   I encouraged [name] to consider DNR but continuing other life prolonging treatments as a compromise. [name] has many questions and even asks about the process of cremation. Again [name] is overwhelmed and unable to make any decisions. I encouraged [name] to talk through this with any friends or family as I fear [name] is suffering. [name] asks me to call [name] back on Monday if [name] still hospitalized. [name] is concerned about [name] dying alone and she wishes to see [name] one more time (but has yet to come visit). Will need outpatient palliative to follow.   46 min  Vinie Sill, NP Palliative Medicine Team Pager # [phone]  (M-F 8a-5p) Team Phone # [phone] (Nights/Weekends)

## 2017-11-15 LAB — BASIC METABOLIC PANEL
ANION GAP: 9 (ref 5–15)
BUN: 45 mg/dL — ABNORMAL HIGH (ref 6–20)
CALCIUM: 8.8 mg/dL — AB (ref 8.9–10.3)
CO2: 23 mmol/L (ref 22–32)
CREATININE: 0.99 mg/dL (ref 0.44–1.00)
Chloride: 106 mmol/L (ref 101–111)
GFR calc non Af Amer: >60 mL/min (ref >60)
GLUCOSE: 104 mg/dL — AB (ref 65–99)
Potassium: 3.8 mmol/L (ref 3.5–5.1)
Sodium: 138 mmol/L (ref 135–145)

## 2017-11-15 LAB — GLUCOSE, CAPILLARY
GLUCOSE-CAPILLARY: 131 mg/dL — AB (ref 65–99)
GLUCOSE-CAPILLARY: 102 mg/dL — AB (ref 65–99)
GLUCOSE-CAPILLARY: 148 mg/dL — AB (ref 65–99)
Glucose-Capillary: 92 mg/dL (ref 65–99)
Glucose-Capillary: 89 mg/dL (ref 65–99)
Glucose-Capillary: 96 mg/dL (ref 65–99)
Glucose-Capillary: 160 mg/dL — ABNORMAL HIGH (ref 65–99)

## 2017-11-15 LAB — CBC
HCT: 26.6 % — ABNORMAL LOW (ref 36.0–46.0)
Hemoglobin: 8.4 g/dL — ABNORMAL LOW (ref 12.0–15.0)
MCH: 30 pg (ref 26.0–34.0)
MCHC: 31.6 g/dL (ref 30.0–36.0)
MCV: 95 fL (ref 78.0–100.0)
PLATELETS: 246 10*3/uL (ref 150–400)
RBC: 2.8 MIL/uL — ABNORMAL LOW (ref 3.87–5.11)
RDW: 16 % — AB (ref 11.5–15.5)
WBC: 13.5 10*3/uL — AB (ref 4.0–10.5)

## 2017-11-15 MED ORDER — PANTOPRAZOLE SODIUM 40 MG PO PACK
40.0000 mg | PACK | Freq: Two times a day (BID) | ORAL | Status: DC
  Administered 2017-11-15 – 2017-11-16 (×3): 40 mg
  Filled 2017-11-15 (×3): qty 20

## 2017-11-15 MED ORDER — PHENYTOIN 125 MG/5ML PO SUSP
250.0000 mg | Freq: Two times a day (BID) | ORAL | Status: DC
  Administered 2017-11-15 – 2017-11-16 (×2): 250 mg
  Filled 2017-11-15 (×2): qty 12

## 2017-11-15 NOTE — Progress Notes (Signed)
PROGRESS NOTE    [name]  XBD:[id number] DOB: 1955-12-16 DOA: 11/05/2017 PCP: Nolene Ebbs, MD     Brief Narrative:  HPI on 11/06/2017 by Dr. Jennette Kettle  [name] a 62 y.o.femalewith medical history significant oftobacco alcohol and cocaine use, CKD stage III, hyperlipidemia, hypertension, uncontrolled diabetes, history of right upper extremity DVT, history of hepatitisC.  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 Patient was most recently admitted from 4/1 to 4/11 for acute blood loss anemia and hemorrhagic shock. Initially believed to be due to bleeding from trach site but ultimately determined to be due to abdominal wall hematoma in the R lower abdomen. Coumadin held initially, patient transfused, and coumadin resumed on discharge. Now patient sent in to ED from SNF after serial hemoglobins (being performed since last admit) declined to 7.1. Patient is unable to provide any history herself due to being in persistent vegetative state.  Interim history Admitted for anemia and blood loss. Seen by gastroenterology who recommended conservative management, hold anticoagulation. Despite further work-up, no other source of infection found. Case was discussed with infectious disease who recommended antibiotic holiday.   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  3.4 cm resolving hematoma/seroma over right flank.  Mild patchy right lower lobe opacities. -Gastroenterology consulted and appreciated, recommended avoiding anticoagulants if possible, transfuse as needed, supportive care, palliative care consultation  -Given multiple coomorbidities and in a vegetative state, IVC filter would not be of benefit -Continue to monitor CBC, Hgb stable today   Acute kidney injury on chronic kidney disease, stage III with uremia -Creatinine upon admission 2.48 (baseline 1.2-1.4) -EDP discussed with Dr. Joelyn Oms, nephrologist, suggested IV fluids and transfusion, does not need to be transferred to [facility] at this time -Resolved   Sepsis secondary to RLL HCAP -Upon admission, CXR reviewed and shows possible area of consolidation right lung base -Patient has been febrile along with persistent leukocytosis -Sputum culture shows rare WBC present (PMN), rare GPC, moderate culture consistent with normal respiratory flora -Strep pneumonia urine antigen negative -Obtain repeat chest x-ray which did show improvement; unremarkable for infection -UA and urine culture unremarkable for infection -Repeat Blood cultures from 11/08/2017: 1/2 GPC: methicillin resistant staph species- SCN (likely contaminant)  -Completed course of cefepime and vancomycin -Repeat blood cultures on 11/11/17 show no growth to date -Continues to spike fevers- could this be secondary  response to pain? Have added IV morphine for pain control -Dr. Ree Kida discussed with Dr. Novella Olive, infectious disease, will give  patient an antibiotic holiday -Continue to monitor fever, treat with tylenol and pain medication prn  -WBC improving and had low grade fever 5/8 12:40pm   Diabetes mellitus, type II -Lantus, Novolog, SSI   Essential hypertension -Continue amlodipine, clonidine, hydralazine, labetalol  HLD -Continue lipitor   Persistent vegetative state -Chronic -Trach and PEG in place, also on vimpat, keppra, dilantin, topamax   History of right upper extremity DVT -Coumadin held due to anemia   Decubitus ulcer, stage III, POA  -Wound care consulted, recommended "Foam dressing to right posterior thigh to protect from moisture and absorb drainage. Santyl for enzymatic debridement of nonviable tissue to sacrum and foam dressing to surrounding skin on buttocks."  Goals of care -Dr. Ree Kida and palliative care discussed goals of care with daughter over the phone. Patient's daughter very apprehensive regarding making decisions regarding her mother.  Would like to wait a few weeks until she is able to come see her mother.  This has been ongoing for several months, and no family has been to see the patient.  Patient has been hospitalized 5 times in the last 6 months.  Discussed about patient's current status will not improve, the brain damage that has been done as permanent.  Discussed that patient is not responsive to pain stimuli.  Discussed that patient could be in pain given her current vital signs, and that comfort measures would be recommended.  Daughter would like to obtain patient's medical records. -Palliative care will follow the patient once discharged back to nursing facility and try to stay in contact with patient's daughter for updates. -Daughter requested patient remains full code at this time.   DVT prophylaxis: SCD Code Status: Full Family Communication: No  family at bedside Disposition Plan: Back to SNF when stable, continue to watch fever and leukocytosis. Transfer to telemetry unit today.    Consultants:  Gastroenterology Nephrology  Palliative care Infectious disease via phone, Dr. Linus Salmons  Procedures:   None   Antimicrobials:  Anti-infectives (From admission, onward)   Start     Dose/Rate Route Frequency Ordered Stop   11/12/17 1400  vancomycin (VANCOCIN) IVPB 1000 mg/200 mL premix  Status:  Discontinued     1,000 mg 200 mL/hr over 60 Minutes Intravenous Every 24 hours 11/12/17 1134 11/13/17 1502   11/09/17 2000  ceFEPIme (MAXIPIME) 1 g in sodium chloride 0.9 % 100 mL IVPB  Status:  Discontinued     1 g 200 mL/hr over 30 Minutes Intravenous Every 8 hours 11/09/17 1326 11/13/17 1502   11/09/17 1200  vancomycin (VANCOCIN) 1,250 mg in sodium chloride 0.9 % 250 mL IVPB  Status:  Discontinued     1,250 mg 166.7 mL/hr over 90 Minutes Intravenous Every 24 hours 11/09/17 1105 11/12/17 1134   11/08/17 0600  vancomycin (VANCOCIN) IVPB 1000 mg/200 mL premix  Status:  Discontinued     1,000 mg 200 mL/hr over 60 Minutes Intravenous Every 48 hours 11/06/17 0514 11/07/17 1343   11/07/17 1800  vancomycin (VANCOCIN) IVPB 1000 mg/200 mL premix  Status:  Discontinued     1,000 mg 200 mL/hr over 60 Minutes Intravenous Every 36 hours 11/07/17 1343 11/07/17 1423   11/07/17 1330  ceFEPIme (MAXIPIME) 1 g in sodium chloride 0.9 % 100 mL IVPB  Status:  Discontinued     1 g 200 mL/hr over 30 Minutes Intravenous Every 12 hours 11/07/17 1320 11/09/17 1326   11/06/17 2000  ceFEPIme (MAXIPIME) 1 g in sodium chloride 0.9 % 100 mL IVPB  Status:  Discontinued     1 g 200 mL/hr over 30 Minutes Intravenous Every 24 hours 11/06/17 0514 11/07/17 1320   11/06/17 0015  vancomycin (VANCOCIN) 2,000 mg in sodium chloride 0.9 % 500 mL IVPB     2,000 mg 250 mL/hr over 120 Minutes Intravenous  Once 11/05/17 2349 11/06/17 0331   11/06/17 0000  ceFEPIme (MAXIPIME) 2 g in  sodium chloride 0.9 % 100 mL IVPB     2 g 200 mL/hr over 30 Minutes Intravenous NOW 11/05/17 2349 11/06/17 0129       Subjective: No acute events overnight.   Objective: Vitals:   11/15/17 0500 11/15/17 0600 11/15/17 0700 11/15/17 0800  BP:  (!) 136/58    Pulse: 85 90 90   Resp: 19 (!) 23 (!) 22   Temp:    99.5 F (37.5 C)  TempSrc:    Axillary  SpO2: 100% 99% 99%   Weight:      Height:        Intake/Output Summary (Last 24 hours) at 11/15/2017 0847 Last data filed at 11/15/2017 0700 Gross per 24 hour  Intake 2720 ml  Output 3875 ml  Net -1155 ml   Filed Weights   11/12/17 0600 11/13/17 1120 11/14/17 0500  Weight: 84.9 kg (187 lb 2.7 oz) 85.3 kg (188 lb 0.8 oz) 86.4 kg (190 lb 7.6 oz)    Examination: General exam: Appears calm and comfortable  Respiratory system: Clear to auscultation. Respiratory effort normal. +Trach collar  Cardiovascular system: S1 & S2 heard, RRR. No JVD, murmurs, rubs, gallops or clicks. No pedal edema. Gastrointestinal system: Abdomen is nondistended, soft and nontender. No organomegaly or masses felt. Normal bowel sounds heard. +PEG  Central nervous system: Alert, nonverbal, noninteractive  Extremities: Symmetric Psychiatry: Persistent vegetative state    Data Reviewed: I have personally reviewed following labs and imaging studies  CBC: Recent Labs  Lab 11/11/17 0332 11/12/17 0344 11/13/17 0353 11/13/17 1158 11/14/17 0316 11/15/17 0306  WBC 17.9* 17.3* 16.8*  --  15.7* 13.5*  HGB 8.7* 8.5* 7.7* 8.8* 8.1* 8.4*  HCT 28.7* 27.1* 24.1* 28.1* 25.3* 26.6*  MCV 97.0 96.8 96.4  --  96.2 95.0  PLT 165 157 170  --  212 578   Basic Metabolic Panel: Recent Labs  Lab 11/08/17 1650  11/10/17 0311 11/11/17 0332 11/12/17 0344 11/13/17 0353 11/14/17 0316 11/15/17 0306  NA  --    < > 154* 151* 148* 143 139 138  K  --    < > 3.7 3.5 3.5 3.6 3.8 3.8  CL  --    < > 120* 115* 112* 108 101 106  CO2  --    < > 22 25 24 25 25 23   GLUCOSE  --     < > 242* 166* 120* 130* 176* 104*  BUN  --    < > 61* 48* 49* 43* 41* 45*  CREATININE  --    < > 1.13* 0.93 1.00 1.00 1.01* 0.99  CALCIUM  --    < > 8.5* 8.6* 8.3* 8.2* 8.3* 8.8*  MG 1.5*  --  1.7 1.5* 1.5* 2.2  --   --   PHOS 2.8  --   --   --   --   --   --   --    < > = values in this interval not displayed.   GFR: Estimated Creatinine Clearance: 66.9 mL/min (by C-G formula based on SCr of 0.99 mg/dL). Liver Function Tests: No  results for input(s): AST, ALT, ALKPHOS, BILITOT, PROT, ALBUMIN in the last 168 hours. No results for input(s): LIPASE, AMYLASE in the last 168 hours. No results for input(s): AMMONIA in the last 168 hours. Coagulation Profile: No results for input(s): INR, PROTIME in the last 168 hours. Cardiac Enzymes: No results for input(s): CKTOTAL, CKMB, CKMBINDEX, TROPONINI in the last 168 hours. BNP (last 3 results) No results for input(s): PROBNP in the last 8760 hours. HbA1C: No results for input(s): HGBA1C in the last 72 hours. CBG: Recent Labs  Lab 11/14/17 1619 11/14/17 1949 11/14/17 2342 11/15/17 0308 11/15/17 0756  GLUCAP 99 112* 138* 92 131*   Lipid Profile: No results for input(s): CHOL, HDL, LDLCALC, TRIG, CHOLHDL, LDLDIRECT in the last 72 hours. Thyroid Function Tests: No results for input(s): TSH, T4TOTAL, FREET4, T3FREE, THYROIDAB in the last 72 hours. Anemia Panel: No results for input(s): VITAMINB12, FOLATE, FERRITIN, TIBC, IRON, RETICCTPCT in the last 72 hours. Sepsis Labs: No results for input(s): PROCALCITON, LATICACIDVEN in the last 168 hours.  Recent Results (from the past 240 hour(s))  Blood culture (routine x 2)     Status: None   Collection Time: 11/06/17 12:14 AM  Result Value Ref Range Status   Specimen Description   Final    BLOOD LEFT ANTECUBITAL Performed at [facility] [address], [place], [place] [address]    Special Requests   Final    BOTTLES DRAWN AEROBIC AND ANAEROBIC Blood Culture adequate  volume Performed at [facility] [address], [place], [place] [address]    Culture   Final    NO GROWTH 5 DAYS Performed at [facility], [place] [address] [place], [place] [address]    Report Status 11/11/2017 FINAL  Final  Blood culture (routine x 2)     Status: None   Collection Time: 11/06/17 12:33 AM  Result Value Ref Range Status   Specimen Description   Final    BLOOD RIGHT FOREARM Performed at [facility] [address], [place], [place] [address]    Special Requests   Final    BOTTLES DRAWN AEROBIC AND ANAEROBIC Blood Culture adequate volume Performed at [facility] [address], [place], [place] [address]    Culture   Final    NO GROWTH 5 DAYS Performed at [facility], [place] [address] [place], [place] [address]    Report Status 11/11/2017 FINAL  Final  Culture, respiratory (NON-Expectorated)     Status: None   Collection Time: 11/06/17  7:45 AM  Result Value Ref Range Status   Specimen Description   Final    TRACHEAL ASPIRATE Performed at [facility] [address], [place], [place] [address]    Special Requests   Final    Normal Performed at [facility], [place] [address], [place], [place] [address]    Gram Stain   Final    RARE WBC PRESENT, PREDOMINANTLY PMN RARE GRAM POSITIVE COCCI    Culture   Final    MODERATE Consistent with normal respiratory flora. Performed at [facility], [place] [address], [place] [employer], [place] [address]    Report Status 11/08/2017 FINAL  Final  MRSA PCR Screening     Status: None   Collection Time: 11/06/17 11:17 PM  Result Value Ref Range Status   MRSA by PCR NEGATIVE NEGATIVE Final    Comment:        The  GeneXpert MRSA Assay (FDA approved for NASAL specimens only), is one component of a comprehensive MRSA colonization surveillance program. It is not intended to diagnose MRSA infection nor to guide  or monitor treatment for MRSA infections. Performed at [facility], [place] [address] [place], [place] [address]   Culture, Urine     Status: None   Collection Time: 11/08/17 10:47 AM  Result Value Ref Range Status   Specimen Description   Final    URINE, CLEAN CATCH Performed at [facility], [place] [address], [place], [place] [address]    Special Requests   Final    NONE Performed at [facility], [place] [address] [place], [place] [address]    Culture   Final    NO GROWTH Performed at [facility], [place] [address], [place], [place] [address]    Report Status 11/09/2017 FINAL  Final  Culture, blood (routine x 2)     Status: None   Collection Time: 11/08/17 12:12 PM  Result Value Ref Range Status   Specimen Description BLOOD LEFT HAND  Final   Special Requests   Final    BOTTLES DRAWN AEROBIC AND ANAEROBIC Blood Culture adequate volume   Culture   Final    NO GROWTH 5 DAYS Performed at [facility], [place] [address], [place], [place] [address]    Report Status 11/13/2017 FINAL  Final  Culture, blood (routine x 2)     Status: Abnormal   Collection Time: 11/08/17 12:16 PM  Result Value Ref Range Status   Specimen Description   Final    BLOOD RIGHT HAND Performed at [facility] [address] [place], [place] [address]    Special Requests   Final    BOTTLES DRAWN AEROBIC ONLY Blood Culture results may not be optimal due to an inadequate volume of blood received in culture bottles Performed at [facility] [address] [place], [place] [address]    Culture  Setup Time   Final    AEROBIC BOTTLE ONLY GRAM POSITIVE COCCI CRITICAL RESULT CALLED TO, READ BACK BY AND VERIFIED WITH: B GREEN PHARMD 11/10/17 0202 JDW    Culture (A)  Final    STAPHYLOCOCCUS SPECIES (COAGULASE NEGATIVE) THE SIGNIFICANCE OF ISOLATING THIS ORGANISM FROM A SINGLE SET OF BLOOD CULTURES WHEN MULTIPLE  SETS ARE DRAWN IS UNCERTAIN. PLEASE NOTIFY THE MICROBIOLOGY DEPARTMENT WITHIN ONE WEEK IF SPECIATION AND SENSITIVITIES ARE REQUIRED. Performed at [facility], [place] [address], [place],  [address]    Report Status 11/11/2017 FINAL  Final  Blood Culture ID Panel (Reflexed)     Status: Abnormal   Collection Time: 11/08/17 12:16 PM  Result Value Ref Range Status   Enterococcus species NOT DETECTED NOT DETECTED Final   Listeria monocytogenes NOT DETECTED NOT DETECTED Final   Staphylococcus species DETECTED (A) NOT DETECTED Final    Comment: Methicillin (oxacillin) resistant coagulase negative staphylococcus. Possible blood culture contaminant (unless isolated from more than one blood culture draw or clinical case suggests pathogenicity). No antibiotic treatment is indicated for blood  culture contaminants. CRITICAL RESULT CALLED TO, READ BACK BY AND VERIFIED WITH: B GREEN PHARMD 11/10/17 0202 JDW    Staphylococcus aureus NOT DETECTED NOT DETECTED Final   Methicillin resistance DETECTED (A) NOT DETECTED Final    Comment: CRITICAL RESULT CALLED TO, READ BACK BY AND VERIFIED WITH: B GREEN PHARMD 11/10/17 0202 JDW    Streptococcus species NOT  DETECTED NOT DETECTED Final   Streptococcus agalactiae NOT DETECTED NOT DETECTED Final   Streptococcus pneumoniae NOT DETECTED NOT DETECTED Final   Streptococcus pyogenes NOT DETECTED NOT DETECTED Final   Acinetobacter baumannii NOT DETECTED NOT DETECTED Final   Enterobacteriaceae species NOT DETECTED NOT DETECTED Final   Enterobacter cloacae complex NOT DETECTED NOT DETECTED Final   Escherichia coli NOT DETECTED NOT DETECTED Final   Klebsiella oxytoca NOT DETECTED NOT DETECTED Final   Klebsiella pneumoniae NOT DETECTED NOT DETECTED Final   Proteus species NOT DETECTED NOT DETECTED Final   Serratia marcescens NOT DETECTED NOT DETECTED Final   Haemophilus influenzae NOT DETECTED NOT DETECTED Final   Neisseria meningitidis NOT DETECTED NOT  DETECTED Final   Pseudomonas aeruginosa NOT DETECTED NOT DETECTED Final   Candida albicans NOT DETECTED NOT DETECTED Final   Candida glabrata NOT DETECTED NOT DETECTED Final   Candida krusei NOT DETECTED NOT DETECTED Final   Candida parapsilosis NOT DETECTED NOT DETECTED Final   Candida tropicalis NOT DETECTED NOT DETECTED Final  Culture, blood (routine x 2)     Status: None (Preliminary result)   Collection Time: 11/11/17 11:54 AM  Result Value Ref Range Status   Specimen Description   Final    BLOOD RIGHT HAND Performed at [facility] [address], [place], [place] [address]    Special Requests   Final    BOTTLES DRAWN AEROBIC AND ANAEROBIC Blood Culture adequate volume Performed at [facility] [address] [place], [place] [address]    Culture   Final    NO GROWTH 3 DAYS Performed at [facility], [place] [address], [place], [place] [address]    Report Status PENDING  Incomplete  Culture, blood (routine x 2)     Status: None (Preliminary result)   Collection Time: 11/11/17 11:57 AM  Result Value Ref Range Status   Specimen Description   Final    BLOOD RIGHT HAND Performed at [facility] [address], [place], [place] [address]    Special Requests   Final    BOTTLES DRAWN AEROBIC ONLY Blood Culture adequate volume Performed at [facility] [address] [place], [place] [address]    Culture   Final    NO GROWTH 3 DAYS Performed at [facility], [place] [address] [place],  [address]    Report Status PENDING  Incomplete       Radiology Studies: No results found.    Scheduled Meds: . amLODipine  10 mg Per Tube Daily  . atorvastatin  40 mg Per Tube q1800  . cloNIDine  0.2 mg Per Tube TID  . collagenase   Topical Daily  . feeding supplement (PRO-STAT SUGAR FREE 64)  30 mL Per Tube BID  . folic acid  1 mg Per Tube Daily  . free water  400 mL Per Tube Q6H  .  hydrALAZINE  100 mg Per Tube Q8H  . insulin aspart  0-15 Units Subcutaneous Q4H  . insulin aspart  3 Units Subcutaneous Q4H  . insulin glargine  24 Units Subcutaneous BID  . labetalol  400 mg Per Tube TID  . lacosamide  200 mg Per Tube BID  . levETIRAcetam  1,000 mg Per Tube BID  . multivitamin  15 mL Per Tube Daily  . pantoprazole sodium  40 mg Per Tube BID  . phenytoin  250 mg Oral BID  . scopolamine  1 patch Transdermal Q72H  .  thiamine  100 mg Per Tube Daily  . topiramate  200 mg Per Tube BID   Continuous Infusions: . feeding supplement (GLUCERNA 1.2 CAL) 70 mL/hr at 11/15/17 0300     LOS: 9 days    Time spent: 25 minutes   Dessa Phi, DO [facility] [email or web address] Password [facility] 11/15/2017, 8:47 AM

## 2017-11-15 NOTE — Progress Notes (Addendum)
Nutrition Follow-up  DOCUMENTATION CODES:   Not applicable  INTERVENTION:  - Continue current TF regimen. Glucerna 1.2 run over 20 hours due to Dilantin per PEG BID. - Free water flush order to continue to be per MD.   NUTRITION DIAGNOSIS:   Increased nutrient needs related to wound healing as evidenced by estimated needs. -ongoing  GOAL:   Patient will meet greater than or equal to 90% of their needs -met with TF regimen  MONITOR:   TF tolerance, Weight trends, Labs, Skin  ASSESSMENT:   62 y.o. female with medical history significant of tobacco alcohol and cocaine use, CKD stage III, hyperlipidemia, hypertension, uncontrolled diabetes, history of right upper extremity DVT, history of hepatitis C.  Weight continues to trend up and is now +20 lbs/9 kg compared to admission weight. Pt in chronic vegetative state; unable to interact. Pt with trach and PEG and is receiving Glucerna 1.2 @ 70 mL/hr x20 hours with 30 mL Prostat BID and 400 mL free water QID. This regimen is providing 1880 kcal, 114 grams of protein, and 2727 mL free water. Current free water flush was ordered by MD on 5/3 d/t hypernatremia.   Per Dr. Jeannine Kitten note this AM, plan to transfer to tele today and back to SNF when she is stable. Monitoring of fever and leukocytosis is ongoing at this time.   Medications reviewed; 1 mg folic acid per PEG/day, sliding scale Novolog, 3 units Novolog every 4 hours, 24 units Lantus BID, 15 mL liquid multivitamin per PEG/day, 40 mg Protonix per PEG BID, 100 mg thiamine per PEG/day.  Labs reviewed; CBGs: 92, 131, and 89 mg/dL this AM, BUN: 45 mg/dL, Ca: 8.8 mg/dL.    Diet Order:   Diet Order    None      EDUCATION NEEDS:   No education needs have been identified at this time  Skin:  Skin Assessment: Skin Integrity Issues: Skin Integrity Issues:: Incisions Unstageable: full thickness to coccyx Incisions: R hip (5/1)  Last BM:  5/7  Height:   Ht Readings from Last 1  Encounters:  11/05/17 _0  (1.676 m)    Weight:   Wt Readings from Last 1 Encounters:  11/14/17 190 lb 7.6 oz (86.4 kg)    Ideal Body Weight:  59.09 kg  BMI:  Body mass index is 30.74 kg/m.  Estimated Nutritional Needs:   Kcal:  1850-2090  Protein:  105-120 grams  Fluid:  >/= 1.8 L/day      Jarome Matin, MS, RD, LDN, [facility] Inpatient Clinical Dietitian Pager # [phone] After hours/weekend pager # [phone]

## 2017-11-15 NOTE — Progress Notes (Signed)
Received pt from ICU. Pt does not appear to be in any pain with no s/s of distress noted. Trach piece hung by pts bedside. Pts assessment is unchanged from earlier documentation. Will continue to monitor

## 2017-11-16 LAB — CBC
HCT: 27.2 % — ABNORMAL LOW (ref 36.0–46.0)
HEMOGLOBIN: 8.6 g/dL — AB (ref 12.0–15.0)
MCH: 30.2 pg (ref 26.0–34.0)
MCHC: 31.6 g/dL (ref 30.0–36.0)
MCV: 95.4 fL (ref 78.0–100.0)
PLATELETS: 299 10*3/uL (ref 150–400)
RBC: 2.85 MIL/uL — ABNORMAL LOW (ref 3.87–5.11)
RDW: 15.6 % — ABNORMAL HIGH (ref 11.5–15.5)
WBC: 13 10*3/uL — AB (ref 4.0–10.5)

## 2017-11-16 LAB — BASIC METABOLIC PANEL
ANION GAP: 11 (ref 5–15)
BUN: 50 mg/dL — ABNORMAL HIGH (ref 6–20)
CHLORIDE: 106 mmol/L (ref 101–111)
CO2: 22 mmol/L (ref 22–32)
Calcium: 9.1 mg/dL (ref 8.9–10.3)
Creatinine, Ser: 0.99 mg/dL (ref 0.44–1.00)
GFR calc Af Amer: >60 mL/min (ref >60)
Glucose, Bld: 133 mg/dL — ABNORMAL HIGH (ref 65–99)
Potassium: 4.1 mmol/L (ref 3.5–5.1)
SODIUM: 139 mmol/L (ref 135–145)

## 2017-11-16 LAB — CULTURE, BLOOD (ROUTINE X 2)
CULTURE: NO GROWTH
Culture: NO GROWTH
SPECIAL REQUESTS: ADEQUATE
Special Requests: ADEQUATE

## 2017-11-16 LAB — GLUCOSE, CAPILLARY
GLUCOSE-CAPILLARY: 138 mg/dL — AB (ref 65–99)
GLUCOSE-CAPILLARY: 181 mg/dL — AB (ref 65–99)
Glucose-Capillary: 134 mg/dL — ABNORMAL HIGH (ref 65–99)

## 2017-11-16 NOTE — Discharge Summary (Signed)
Physician Discharge Summary  [name] ZJI:[id number] DOB: [birth year] DOA: 11/05/2017  PCP: Nolene Ebbs, MD  Admit date: 11/05/2017 Discharge date: 11/16/2017  Admitted From: SNF Disposition:  SNF   Recommendations for Outpatient Follow-up:  1. Continue outpatient palliative care discussions with family for goals of care. 2. Check CBC intermittently and transfuse pRBC as needed to keep Hgb > 7. No further GI work up planned. Stop anticoagulation.   Discharge Condition: Stable, improved CODE STATUS: Full  Diet recommendation: Tube feed through PEG   Brief/Interim Summary: HPI on 11/06/2017 by Dr. Jennette Kettle  [name] a 62 y.o.femalewith medical history significant oftobacco alcohol and cocaine use, CKD stage III, hyperlipidemia, hypertension, uncontrolled diabetes, history of right upper extremity DVT, history of hepatitisC. 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 Initially believed to be due to bleeding from trach site but ultimately determined to be due to abdominal wall hematoma in the R lower abdomen. Coumadin held initially, patient transfused, and coumadin resumed on discharge. Now patient sent in to ED from SNF after serial hemoglobins (being performed since last admit) declined to 7.1. Patient is unable to provide any history herself due to being in persistent vegetative state.  Interim history  She was admitted for anemia and blood loss. She was  seen by gastroenterology who recommended conservative management, hold anticoagulation. She was treated for sepsis secondary to pneumonia.  She continued to have fevers despite full antibiotic course. Despite further work-up, no source of infection found. Case was discussed with infectious disease who recommended antibiotic holiday. She was treated for possible pain which may lead to intermittent fevers. She remained fever free for 24 hours prior to discharge back to SNF. She was also evaluated by palliative care medicine who had ongoing discussion with daughter who lives out of state.  Encourage continued palliative care conversations with daughter as patient has very poor prognosis, is in vegetative state, yet remains full code.  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 3.4 cm resolving hematoma/seroma over right flank. Mild patchy right lower lobe opacities. -Gastroenterology consulted and appreciated, recommended avoiding anticoagulants if possible, transfuse as needed, supportive care, palliative care consultation  -Given multiple coomorbidities and in a vegetative state, IVC filter would not be of benefit -Continue to monitor CBC, Hgb stable today 8.6   Acute kidney injury  on chronic kidney disease, stage III with uremia -Creatinine upon admission 2.48 (baseline 1.2-1.4) -EDP  discussed with Dr. Joelyn Oms, nephrologist, suggested IV fluids and transfusion, does not need to be transferred to [facility] at this time -Resolved Cr 0.99 today   Sepsis secondary to RLL HCAP -Upon admission, CXR reviewed and shows possible area of consolidation right lung base -Patient has been febrile along with persistent leukocytosis -Sputum culture shows rare WBC present (PMN), rare GPC, moderate culture consistent with normal respiratory flora -Strep pneumonia urine antigen negative -Obtain repeat chest x-ray which did show improvement; unremarkable for infection -UA and urine culture unremarkable for infection -Repeat Blood cultures from 11/08/2017: 1/2 GPC: methicillin resistant staph species- SCN (likely contaminant) -Completed course ofcefepime and vancomycin -Repeat blood cultures on 11/11/17 show no growth to date -Continues to spike fevers- could this be secondary response to pain?  -Dr. Ree Kida discussedwith Dr. Novella Olive, infectious disease, will give patient an antibiotic holiday -Continue to monitor fever, treat with tylenol and pain medication prn  -No further fevers last 24 hours  Diabetes mellitus, type II -Lantus, Novolog, SSI   Essential hypertension -Continue amlodipine, clonidine, hydralazine, labetalol  HLD -Continue lipitor   Persistent vegetative state -Chronic -Trach and PEG in place, also on vimpat, keppra, dilantin, topamax   History of right upper extremity DVT -Coumadin held due to anemia   Decubitus ulcer, stage III, POA  -Wound care consulted, recommended "Foam dressing to right posterior thigh to protect from moisture and absorb drainage. Santyl for enzymatic debridement of nonviable tissue to sacrum and foam dressing to surrounding skin on buttocks."  Goals of care -Dr. Ree Kida and palliative care discussed goals of care with  daughter over the phone. Patient's daughter very apprehensive regarding making decisions regarding her mother. Would like to wait a few weeks until she is able to come see her mother. This has been ongoing for several months, and no family has been to see the patient. Patient has been hospitalized 5 times in the last 6 months. Discussed about patient's current status will not improve, the brain damage that has been done as permanent. Discussed that patient is not responsive to pain stimuli. Discussed that patient could be in pain given her current vital signs, and that comfort measures would be recommended. Daughter would like to obtain patient's medical records. -Palliative care will follow the patient once discharged back to nursing facility and try to stay in contact with patient's daughter for updates. -Daughter requested patient remains full code at this time.   Discharge Instructions  Discharge Instructions    Discharge instructions   Complete by:  As directed    You were cared for by a hospitalist during your hospital stay. If you have any questions about your discharge medications or the care you received while you were in the hospital after you are discharged, you can call the unit and ask to speak with the hospitalist on call if the hospitalist that took care of you is not available. Once you are discharged, your primary care physician will handle any further medical issues. Please note that NO REFILLS for any discharge medications will be authorized once you are discharged, as it is imperative that you return to your primary care physician (or establish a relationship with a primary care physician if you do not have one) for your aftercare needs so that they can reassess your need for medications and monitor your lab values.     Allergies as of 11/16/2017   No Known Allergies     Medication List    STOP taking these medications  aspirin 81 MG chewable tablet   warfarin 4 MG  tablet Commonly known as:  COUMADIN     TAKE these medications   acetaminophen 160 MG/5ML solution Commonly known as:  TYLENOL Place 10.2 mLs (325 mg total) into feeding tube every 6 (six) hours as needed for mild pain, headache or fever.   albuterol (2.5 MG/3ML) 0.083% nebulizer solution Commonly known as:  PROVENTIL Take 3 mLs (2.5 mg total) by nebulization every 2 (two) hours as needed for wheezing.   amLODipine 10 MG tablet Commonly known as:  NORVASC Place 1 tablet (10 mg total) into feeding tube daily.   atorvastatin 40 MG tablet Commonly known as:  LIPITOR Place 1 tablet (40 mg total) into feeding tube daily at 6 PM.   cloNIDine 0.2 MG tablet Commonly known as:  CATAPRES Place 1 tablet (0.2 mg total) into feeding tube 3 (three) times daily.   docusate 50 MG/5ML liquid Commonly known as:  COLACE Place 5 mLs (50 mg total) into feeding tube 2 (two) times daily as needed for mild constipation.   feeding supplement (PRO-STAT SUGAR FREE 64) Liqd Place 30 mLs into feeding tube 3 (three) times daily. What changed:  when to take this   folic acid 1 MG tablet Commonly known as:  FOLVITE Take 1 tablet (1 mg total) by mouth daily.   hydrALAZINE 100 MG tablet Commonly known as:  APRESOLINE Place 1 tablet (100 mg total) into feeding tube every 8 (eight) hours.   insulin glargine 100 UNIT/ML injection Commonly known as:  LANTUS Inject 0.4 mLs (40 Units total) into the skin 2 (two) times daily.   insulin lispro 100 UNIT/ML injection Commonly known as:  HUMALOG Inject 2-10 Units into the skin 4 (four) times daily. Per sliding scale. 201-250=2 units, 251-300=4 units, 301-350=6 units, 351-400=8 units, 401-450=10 units > 450 Call MD.   labetalol 200 MG tablet Commonly known as:  NORMODYNE Place 2 tablets (400 mg total) into feeding tube 3 (three) times daily.   lacosamide 200 MG Tabs tablet Commonly known as:  VIMPAT Place 1 tablet (200 mg total) into feeding tube 2 (two)  times daily.   levETIRAcetam 100 MG/ML solution Commonly known as:  KEPPRA Place 10 mLs (1,000 mg total) into feeding tube 2 (two) times daily.   LORazepam 2 MG/ML injection Commonly known as:  ATIVAN Inject 0.5 mLs (1 mg total) into the vein as needed for seizure (Give if seizure lasts longer than 5 minutes).   MULTIVITAMIN+ Liqd Take 15 mLs by mouth daily.   nutrition supplement (JUVEN) Pack Place 1 packet into feeding tube 2 (two) times daily between meals.   pantoprazole sodium 40 mg/20 mL Pack Commonly known as:  PROTONIX Place 20 mLs (40 mg total) into feeding tube at bedtime.   phenytoin 125 MG/5ML suspension Commonly known as:  DILANTIN Place 250 mg into feeding tube 2 (two) times daily.   scopolamine 1 MG/3DAYS Commonly known as:  TRANSDERM-SCOP Place 1 patch (1.5 mg total) onto the skin every 3 (three) days.   thiamine 100 MG tablet Place 1 tablet (100 mg total) into feeding tube daily.   topiramate 200 MG tablet Commonly known as:  TOPAMAX Place 1 tablet (200 mg total) into feeding tube 2 (two) times daily.      Contact information for after-discharge care    [facility] .   Service:  Skilled Nursing Contact information: [place] [phone]  No Known Allergies  Consultations: Gastroenterology Nephrology  Palliative care Infectious disease via phone, Dr.Comer   Procedures/Studies: Ct Abdomen Pelvis Wo Contrast  Result Date: 11/05/2017 CLINICAL DATA:  Anemia, abdominal distention EXAM: CT ABDOMEN AND PELVIS WITHOUT CONTRAST TECHNIQUE: Multidetector CT imaging of the abdomen and pelvis was performed following the standard protocol without IV contrast. COMPARISON:  10/08/2017 FINDINGS: Lower chest: Respiratory motion at the lung bases. Mild patchy right lower lobe opacities, favored to reflect a combination of atelectasis and mild pneumonia anteriorly (series 4/image  16). Hepatobiliary: Unenhanced liver is grossly unremarkable. Gallbladder is unremarkable. No intrahepatic extrahepatic ductal dilatation. Pancreas: Within normal limits. Spleen: Within normal limits. Adrenals/Urinary Tract: Adrenal glands are within normal limits. 3 mm nonobstructing right lower pole renal calculus (series 2/image 40). Left kidney is within normal limits. No hydronephrosis. Bladder is within normal limits. Stomach/Bowel: Percutaneous gastrostomy along the anterior gastric antrum. No evidence of bowel obstruction. Normal appendix (series 2/image 56). Left colonic diverticulosis, without evidence of diverticulitis. Vascular/Lymphatic: No evidence of abdominal aortic aneurysm. Atherosclerotic calcifications of the abdominal aorta and branch vessels. No suspicious abdominopelvic lymphadenopathy. Reproductive: Uterus is within normal limits. No adnexal masses. Other: No abdominopelvic ascites. Musculoskeletal: 3.4 cm resolving hematoma/seroma overlying the right flank (series 2/image 64). Minimal subcutaneous stranding/resolving hematoma overlying the left lower abdomen. No evidence of acute retroperitoneal hematoma. Mild degenerative changes of the visualized thoracolumbar spine. IMPRESSION: No evidence of acute retroperitoneal hematoma. 3.4 cm resolving hematoma/seroma overlying the right flank, improved. Mild patchy right lower lobe opacities, favored to reflect a combination of atelectasis and right lower lobe pneumonia. Additional ancillary findings as above. Electronically Signed   By: Julian Hy M.D.   On: 11/05/2017 23:20   Dg Chest Port 1 View  Result Date: 11/08/2017 CLINICAL DATA:  History of tobacco, alcohol, and drug abuse, stage 3 chronic kidney disease EXAM: PORTABLE CHEST 1 VIEW COMPARISON:  Portable chest x-ray of 11/05/2017 FINDINGS: Mild opacity remains at the lung bases most consistent with atelectasis with some depression of the right minor fissure noted as well. This  appearance has improved somewhat compared to the prior chest x-ray. No pleural effusion is seen. Mediastinal and hilar contours are unremarkable and mild cardiomegaly is stable. Tracheostomy remains. Degenerative changes again noted in the shoulders right greater than left. IMPRESSION: 1. Improvement in probable basilar atelectasis. No pneumonia or effusion. 2. Stable cardiomegaly. 3. Tracheostomy remains. Electronically Signed   By: Ivar Drape M.D.   On: 11/08/2017 10:59   Dg Chest Portable 1 View  Result Date: 11/05/2017 CLINICAL DATA:  Cough.  Low hemoglobin.  Tracheostomy patient. EXAM: PORTABLE CHEST 1 VIEW COMPARISON:  10/14/2017 FINDINGS: Tracheostomy tube remains in place. Shallow inspiration. Linear atelectasis or fibrosis in the left mid lung is unchanged since previous study. There is new area of consolidation in the right lung base. This may indicate developing pneumonia or increasing atelectasis. No pleural effusions. No pneumothorax. Mediastinal contours appear intact. Heart size and pulmonary vascularity are normal. Calcification of the aorta. Degenerative changes in the spine and shoulders. IMPRESSION: New area of consolidation in the right lung base may represent developing pneumonia or increasing atelectasis. Persistent fibrosis or atelectasis in the left mid lung without change. Electronically Signed   By: Lucienne Capers M.D.   On: 11/05/2017 23:50   [place] Ekg Site Rite  Result Date: 11/10/2017 If Site Rite image not attached, placement could not be confirmed due to current cardiac rhythm.      Discharge Exam: Vitals:   11/16/17  0422 11/16/17 0809  BP: (!) 141/66   Pulse: 96 95  Resp: (!) 22 20  Temp: 98.2 F (36.8 C)   SpO2: 97% 96%    General: Pt is alert, awake, not in acute distress, non-interactive, non-verbal  Cardiovascular: RRR, S1/S2 +, no rubs, no gallops Respiratory: CTA bilaterally, no wheezing, no rhonchi Abdominal: Soft, NT, ND, bowel sounds +, +PEG   Extremities: no edema, no cyanosis    The results of significant diagnostics from this hospitalization (including imaging, microbiology, ancillary and laboratory) are listed below for reference.     Microbiology: Recent Results (from the past 240 hour(s))  MRSA PCR Screening     Status: None   Collection Time: 11/06/17 11:17 PM  Result Value Ref Range Status   MRSA by PCR NEGATIVE NEGATIVE Final    Comment:        The GeneXpert MRSA Assay (FDA approved for NASAL specimens only), is one component of a comprehensive MRSA colonization surveillance program. It is not intended to diagnose MRSA infection nor to guide or monitor treatment for MRSA infections. Performed at [facility], [place] [address] [place], [place] [address]   Culture, Urine     Status: None   Collection Time: 11/08/17 10:47 AM  Result Value Ref Range Status   Specimen Description   Final    URINE, CLEAN CATCH Performed at [facility], [place] [address], [place], [place] [address]    Special Requests   Final    NONE Performed at [facility], [place] [address], [place], [place] [address]    Culture   Final    NO GROWTH Performed at [facility], [place] [address] [place], [place] [address]    Report Status 11/09/2017 FINAL  Final  Culture, blood (routine x 2)     Status: None   Collection Time: 11/08/17 12:12 PM  Result Value Ref Range Status   Specimen Description BLOOD LEFT HAND  Final   Special Requests   Final    BOTTLES DRAWN AEROBIC AND ANAEROBIC Blood Culture adequate volume   Culture   Final    NO GROWTH 5 DAYS Performed at [facility], [place] [address] [place], [place] [address]    Report Status 11/13/2017 FINAL  Final  Culture, blood (routine x 2)     Status: Abnormal   Collection Time: 11/08/17 12:16 PM  Result Value Ref Range Status   Specimen Description   Final    BLOOD RIGHT HAND Performed at [place] [address], [place], [place] [address]    Special Requests   Final    BOTTLES DRAWN AEROBIC ONLY Blood Culture results may not be optimal due to an inadequate volume of blood received in culture bottles Performed at [place] [address] [place], [place] [address]    Culture  Setup Time   Final    AEROBIC BOTTLE ONLY GRAM POSITIVE COCCI CRITICAL RESULT CALLED TO, READ BACK BY AND VERIFIED WITH: B GREEN PHARMD 11/10/17 0202 JDW    Culture (A)  Final    STAPHYLOCOCCUS SPECIES (COAGULASE NEGATIVE) THE SIGNIFICANCE OF ISOLATING THIS ORGANISM FROM A SINGLE SET OF BLOOD CULTURES WHEN MULTIPLE SETS ARE DRAWN IS UNCERTAIN. PLEASE NOTIFY THE MICROBIOLOGY DEPARTMENT WITHIN ONE WEEK IF SPECIATION AND SENSITIVITIES ARE REQUIRED. Performed at [facility], [place] [address], [place], [place] [address]    Report Status 11/11/2017 FINAL  Final  Blood  Culture ID Panel (Reflexed)     Status: Abnormal   Collection Time: 11/08/17 12:16 PM  Result Value Ref Range Status   Enterococcus species NOT DETECTED NOT DETECTED Final   Listeria monocytogenes NOT DETECTED NOT DETECTED Final   Staphylococcus species DETECTED (A) NOT DETECTED Final    Comment: Methicillin (oxacillin) resistant coagulase negative staphylococcus. Possible blood culture contaminant (unless isolated from more than one blood culture draw or clinical case suggests pathogenicity). No antibiotic treatment is indicated for blood  culture contaminants. CRITICAL RESULT CALLED TO, READ BACK BY AND VERIFIED WITH: B GREEN PHARMD 11/10/17 0202 JDW    Staphylococcus aureus NOT DETECTED NOT DETECTED Final   Methicillin resistance DETECTED (A) NOT DETECTED Final    Comment: CRITICAL RESULT CALLED TO, READ BACK BY AND VERIFIED WITH: B GREEN PHARMD 11/10/17 0202 JDW    Streptococcus species NOT DETECTED NOT DETECTED Final   Streptococcus agalactiae NOT DETECTED NOT DETECTED Final   Streptococcus pneumoniae NOT  DETECTED NOT DETECTED Final   Streptococcus pyogenes NOT DETECTED NOT DETECTED Final   Acinetobacter baumannii NOT DETECTED NOT DETECTED Final   Enterobacteriaceae species NOT DETECTED NOT DETECTED Final   Enterobacter cloacae complex NOT DETECTED NOT DETECTED Final   Escherichia coli NOT DETECTED NOT DETECTED Final   Klebsiella oxytoca NOT DETECTED NOT DETECTED Final   Klebsiella pneumoniae NOT DETECTED NOT DETECTED Final   Proteus species NOT DETECTED NOT DETECTED Final   Serratia marcescens NOT DETECTED NOT DETECTED Final   Haemophilus influenzae NOT DETECTED NOT DETECTED Final   Neisseria meningitidis NOT DETECTED NOT DETECTED Final   Pseudomonas aeruginosa NOT DETECTED NOT DETECTED Final   Candida albicans NOT DETECTED NOT DETECTED Final   Candida glabrata NOT DETECTED NOT DETECTED Final   Candida krusei NOT DETECTED NOT DETECTED Final   Candida parapsilosis NOT DETECTED NOT DETECTED Final   Candida tropicalis NOT DETECTED NOT DETECTED Final  Culture, blood (routine x 2)     Status: None   Collection Time: 11/11/17 11:54 AM  Result Value Ref Range Status   Specimen Description   Final    BLOOD RIGHT HAND Performed at [facility], [place] [address], [place], [place] [address]    Special Requests   Final    BOTTLES DRAWN AEROBIC AND ANAEROBIC Blood Culture adequate volume Performed at [place] [address], [place], [place] [address]    Culture   Final    NO GROWTH 5 DAYS Performed at [facility], [place] [address], [place], [place] [address]    Report Status 11/16/2017 FINAL  Final  Culture, blood (routine x 2)     Status: None   Collection Time: 11/11/17 11:57 AM  Result Value Ref Range Status   Specimen Description   Final    BLOOD RIGHT HAND Performed at [place] [address], [place], [place] [address]    Special Requests   Final    BOTTLES DRAWN AEROBIC ONLY Blood Culture adequate  volume Performed at [facility] [address], [place], [place] [address]    Culture   Final    NO GROWTH 5 DAYS Performed at [facility], [place] [address], [place], [place] [address]    Report Status 11/16/2017 FINAL  Final     Labs: BNP (last 3 results) Recent Labs    05/25/17 1955  BNP 09.7   Basic Metabolic Panel: Recent Labs  Lab 11/10/17 0311 11/11/17 0332 11/12/17 0344  11/13/17 0353 11/14/17 0316 11/15/17 0306 11/16/17 0548  NA 154* 151* 148* 143 139 138 139  K 3.7 3.5 3.5 3.6 3.8 3.8 4.1  CL 120* 115* 112* 108 101 106 106  CO2 22 25 24 25 25 23 22   GLUCOSE 242* 166* 120* 130* 176* 104* 133*  BUN 61* 48* 49* 43* 41* 45* 50*  CREATININE 1.13* 0.93 1.00 1.00 1.01* 0.99 0.99  CALCIUM 8.5* 8.6* 8.3* 8.2* 8.3* 8.8* 9.1  MG 1.7 1.5* 1.5* 2.2  --   --   --    Liver Function Tests: No results for input(s): AST, ALT, ALKPHOS, BILITOT, PROT, ALBUMIN in the last 168 hours. No results for input(s): LIPASE, AMYLASE in the last 168 hours. No results for input(s): AMMONIA in the last 168 hours. CBC: Recent Labs  Lab 11/12/17 0344 11/13/17 0353 11/13/17 1158 11/14/17 0316 11/15/17 0306 11/16/17 0548  WBC 17.3* 16.8*  --  15.7* 13.5* 13.0*  HGB 8.5* 7.7* 8.8* 8.1* 8.4* 8.6*  HCT 27.1* 24.1* 28.1* 25.3* 26.6* 27.2*  MCV 96.8 96.4  --  96.2 95.0 95.4  PLT 157 170  --  212 246 299   Cardiac Enzymes: No results for input(s): CKTOTAL, CKMB, CKMBINDEX, TROPONINI in the last 168 hours. BNP: Invalid input(s): POCBNP CBG: Recent Labs  Lab 11/15/17 2010 11/15/17 2315 11/16/17 0413 11/16/17 0733 11/16/17 1100  GLUCAP 160* 148* 138* 134* 181*   D-Dimer No results for input(s): DDIMER in the last 72 hours. Hgb A1c No results for input(s): HGBA1C in the last 72 hours. Lipid Profile No results for input(s): CHOL, HDL, LDLCALC, TRIG, CHOLHDL, LDLDIRECT in the last 72 hours. Thyroid function studies No results for input(s): TSH, T4TOTAL,  T3FREE, THYROIDAB in the last 72 hours.  Invalid input(s): FREET3 Anemia work up No results for input(s): VITAMINB12, FOLATE, FERRITIN, TIBC, IRON, RETICCTPCT in the last 72 hours. Urinalysis    Component Value Date/Time   COLORURINE YELLOW 11/08/2017 [place] 11/08/2017 1046   LABSPEC 1.012 11/08/2017 1046   PHURINE 6.0 11/08/2017 1046   GLUCOSEU >=500 (A) 11/08/2017 1046   GLUCOSEU > 1000 mg/dL (A) 11/01/2007 2056   HGBUR NEGATIVE 11/08/2017 1046   HGBUR negative 07/15/2007 0954   BILIRUBINUR NEGATIVE 11/08/2017 1046   KETONESUR NEGATIVE 11/08/2017 1046   PROTEINUR 30 (A) 11/08/2017 1046   UROBILINOGEN 1.0 04/09/2012 1524   NITRITE NEGATIVE 11/08/2017 1046   LEUKOCYTESUR TRACE (A) 11/08/2017 1046   Sepsis Labs Invalid input(s): PROCALCITONIN,  WBC,  LACTICIDVEN Microbiology Recent Results (from the past 240 hour(s))  MRSA PCR Screening     Status: None   Collection Time: 11/06/17 11:17 PM  Result Value Ref Range Status   MRSA by PCR NEGATIVE NEGATIVE Final    Comment:        The GeneXpert MRSA Assay (FDA approved for NASAL specimens only), is one component of a comprehensive MRSA colonization surveillance program. It is not intended to diagnose MRSA infection nor to guide or monitor treatment for MRSA infections. Performed at [facility], [place] [address] [place], [place] [address]   Culture, Urine     Status: None   Collection Time: 11/08/17 10:47 AM  Result Value Ref Range Status   Specimen Description   Final    URINE, CLEAN CATCH Performed at [facility], [place] [address], [place], [place] [address]    Special Requests   Final    NONE Performed at [facility], [place]  [address] [place], [place] [address]    Culture   Final    NO GROWTH Performed at [facility], [place] [address], [place], [place] [address]    Report Status 11/09/2017 FINAL  Final  Culture, blood (routine x 2)      Status: None   Collection Time: 11/08/17 12:12 PM  Result Value Ref Range Status   Specimen Description BLOOD LEFT HAND  Final   Special Requests   Final    BOTTLES DRAWN AEROBIC AND ANAEROBIC Blood Culture adequate volume   Culture   Final    NO GROWTH 5 DAYS Performed at [facility], [place] [address] [place], [place] [address]    Report Status 11/13/2017 FINAL  Final  Culture, blood (routine x 2)     Status: Abnormal   Collection Time: 11/08/17 12:16 PM  Result Value Ref Range Status   Specimen Description   Final    BLOOD RIGHT HAND Performed at [place] [address], [place], [place] [address]    Special Requests   Final    BOTTLES DRAWN AEROBIC ONLY Blood Culture results may not be optimal due to an inadequate volume of blood received in culture bottles Performed at [place] [address] [place], [place] [address]    Culture  Setup Time   Final    AEROBIC BOTTLE ONLY GRAM POSITIVE COCCI CRITICAL RESULT CALLED TO, READ BACK BY AND VERIFIED WITH: B GREEN PHARMD 11/10/17 0202 JDW    Culture (A)  Final    STAPHYLOCOCCUS SPECIES (COAGULASE NEGATIVE) THE SIGNIFICANCE OF ISOLATING THIS ORGANISM FROM A SINGLE SET OF BLOOD CULTURES WHEN MULTIPLE SETS ARE DRAWN IS UNCERTAIN. PLEASE NOTIFY THE MICROBIOLOGY DEPARTMENT WITHIN ONE WEEK IF SPECIATION AND SENSITIVITIES ARE REQUIRED. Performed at [facility], [place] [address], [place],  [address]    Report Status 11/11/2017 FINAL  Final  Blood Culture ID Panel (Reflexed)     Status: Abnormal   Collection Time: 11/08/17 12:16 PM  Result Value Ref Range Status   Enterococcus species NOT DETECTED NOT DETECTED Final   Listeria monocytogenes NOT DETECTED NOT DETECTED Final   Staphylococcus species DETECTED (A) NOT DETECTED Final    Comment: Methicillin (oxacillin) resistant coagulase negative staphylococcus. Possible blood culture contaminant (unless isolated from more than  one blood culture draw or clinical case suggests pathogenicity). No antibiotic treatment is indicated for blood  culture contaminants. CRITICAL RESULT CALLED TO, READ BACK BY AND VERIFIED WITH: B GREEN PHARMD 11/10/17 0202 JDW    Staphylococcus aureus NOT DETECTED NOT DETECTED Final   Methicillin resistance DETECTED (A) NOT DETECTED Final    Comment: CRITICAL RESULT CALLED TO, READ BACK BY AND VERIFIED WITH: B GREEN PHARMD 11/10/17 0202 JDW    Streptococcus species NOT DETECTED NOT DETECTED Final   Streptococcus agalactiae NOT DETECTED NOT DETECTED Final   Streptococcus pneumoniae NOT DETECTED NOT DETECTED Final   Streptococcus pyogenes NOT DETECTED NOT DETECTED Final   Acinetobacter baumannii NOT DETECTED NOT DETECTED Final   Enterobacteriaceae species NOT DETECTED NOT DETECTED Final   Enterobacter cloacae complex NOT DETECTED NOT DETECTED Final   Escherichia coli NOT DETECTED NOT DETECTED Final   Klebsiella oxytoca NOT DETECTED NOT DETECTED Final   Klebsiella pneumoniae NOT DETECTED NOT DETECTED Final   Proteus species NOT DETECTED NOT DETECTED Final   Serratia marcescens NOT DETECTED NOT DETECTED Final   Haemophilus influenzae NOT DETECTED NOT DETECTED Final   Neisseria meningitidis NOT  DETECTED NOT DETECTED Final   Pseudomonas aeruginosa NOT DETECTED NOT DETECTED Final   Candida albicans NOT DETECTED NOT DETECTED Final   Candida glabrata NOT DETECTED NOT DETECTED Final   Candida krusei NOT DETECTED NOT DETECTED Final   Candida parapsilosis NOT DETECTED NOT DETECTED Final   Candida tropicalis NOT DETECTED NOT DETECTED Final  Culture, blood (routine x 2)     Status: None   Collection Time: 11/11/17 11:54 AM  Result Value Ref Range Status   Specimen Description   Final    BLOOD RIGHT HAND Performed at [place] [address], [place], [place] [address]    Special Requests   Final    BOTTLES DRAWN AEROBIC AND ANAEROBIC Blood Culture adequate volume Performed  at [place] [address], [place], [place] [address]    Culture   Final    NO GROWTH 5 DAYS Performed at [facility], [place] [address] [place], [place] [address]    Report Status 11/16/2017 FINAL  Final  Culture, blood (routine x 2)     Status: None   Collection Time: 11/11/17 11:57 AM  Result Value Ref Range Status   Specimen Description   Final    BLOOD RIGHT HAND Performed at [place] [address], [place], [place] [address]    Special Requests   Final    BOTTLES DRAWN AEROBIC ONLY Blood Culture adequate volume Performed at [place] [address], [place], [place] [address]    Culture   Final    NO GROWTH 5 DAYS Performed at [facility], [place] [address] [place], [place] [address]    Report Status 11/16/2017 FINAL  Final     Patient was seen and examined on the day of discharge and was found to be in stable condition. Time coordinating discharge: 40 minutes including assessment and coordination of care, as well as examination of the patient.   SIGNED:  Dessa Phi, DO [facility] Pager [phone]  If 7PM-7AM, please contact night-coverage [email or web address] Password [email or web address] 11/16/2017, 11:07 AM

## 2017-11-16 NOTE — Progress Notes (Signed)
Report called to [facility]. 

## 2017-11-16 NOTE — Progress Notes (Addendum)
Discharge: Patient returning to [facility] SN[facility] Room: Eddie Candle D/C Summary sent via Epic.  Nurse given the number to call report.  CSW left a voicemail for patient daughter [name] [phone] to inform about patient discharge.  12:56pm PTAR arranged for for transport.    Kathrin Greathouse, Latanya Presser, MSW Clinical Social Worker  [phone] 11/16/2017  12:54 PM

## 2017-11-23 ENCOUNTER — Non-Acute Institutional Stay: Payer: Self-pay | Admitting: Internal Medicine

## 2017-11-23 VITALS — BP 150/74 | HR 91 | Resp 14

## 2017-11-23 DIAGNOSIS — Z515 Encounter for palliative care: Secondary | ICD-10-CM

## 2017-11-26 ENCOUNTER — Encounter: Payer: Self-pay | Admitting: Internal Medicine

## 2017-11-26 NOTE — Progress Notes (Signed)
11/23/2017  PALLIATIVE CARE CONSULT Simek, Chonda. DOB: 05-20-1956 . [address], [place], [place] (adm to facility 09/09/2017)  REFERRING PROVIDER:  Dr. Seward Carol (M) [phone]/Toshia Chipman NP [phone] Contacts: Royston Bake (daughter) [phone]. (H) [phone] E-mail: [email or web address]  ICD-10: Advanced Care Planning IMPRESSION/RECOMMENDATIONS: 1. Persistent vegetative state Patient slowly opens her eyes to my voice and touch, but not engaging, or tracking with, her eyes. No spontaneous movement. 100% dependent all ADLs;  PEG (BMI 32.6)/trach (strong cough clear secretions) intact. Staff report that patient moans/cries out with turns, with accompanying UE movements. 2. Pressure injuries: Right lateral shin stage 2 pressure injury 3" x 1"; pink wound bed without signs infection/inflammation. Buttocks not visualized;  staff report right lower buttock pressure injury stage 3, and an across upper sacrum stage 3 as well. Dressing changes are qd and improving. Not draining any purulent secretions.  3. Advanced Care Planning:  Patient is a full code. Next of kin is daughter [name] who lives out of state. I called and left a message on Tiffany's answering machine with my contact information, and encouraged her to call and leave me a message regarding a good time/day for me to call and provide updates her on her mother's condition. I hope to pursue discussions regarding advanced care directives.  I spent 60 minutes providing this consultation (from  1:30pm to 2:30 pm). More than 50% of that time was spent coordinating communication.  HPI: 62 yo AA female resident of [place], who is in a persistent vegetative state following hospitalization Jan-March earlier this year. Prior lifestyle of cocaine abuse exacerbated  her underlying  medical conditions of uncontrolled DM, HTN, and CKD, resulting in multi admissions (5 times over the  last 6 months) for hypertensive crises, intracranial hemorrhages, watershed strokes, and  status epilepticus. Recent hospital admission for anemia/blood loss/sepsis from pneumonia. Hospital Palliative Care services have been holding ongoing telephone discussions with patient's daughter (who lives out of state) regarding patient's very poor prognosis and persistent vegetative state. It was discussed with daughter that patient's current status will not improve; that the brain damage that has been done is permanent. Comfort measures were recommended. Daughter had requested patient's medical records. She has not yet come to visit patient. Patient now referred to outpatient Palliative Care for assistance with symptom management, help with coordination of resources, and ongoing discussions regarding goals of care/advanced directives with patient's daughter.   CODE STATUS: full PPS: 30%. HOSPICE ELIGIBILITY:  yes, if changed to comfort measures only  MEDICATIONS :   Acetaminophen 160mg /22ml solution 73ml q6h prn, albuterol 2.5/11ml neb q2h prn wheezing, amlodipine 10mg  qd, atorvastatin 40mg  qd, clonidine 0.2mg  tid, docusate 50mg  bid, Pro-Stat sugar free 64 tid, folic acid 1mg  qd, hydralazine 100mg  q8h, Lantus 40u's bid, Humalog sliding scale qid, labetalol 200mg  tid, lacosamide 200mg  bid, Keppra  1,000mg  bid, ativan 0.5mg  prn seizure activity, MTV qd, pantoprazole 20mg  qhs, phenytoin 250mg  bid, scopolamine patch 1.5mg  q3d, thiamine 100mg  qd ALLERGIES:  NKDA  PAST MEDICAL HISTORY:  Poly substance abuse (cocaine/tobacco/alcohol), CKD stage III, hyperlipidemia, HTN, uncontrolled diabetes, RUE DVT, hepatitis C, decubitus ulcer stage III, pneumonia, 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  04/01-04/05/2018: acute blood loss anemia/hemorrhagic shock (abdominal wall hematoma R lower abd. Anticoagulation resumed.   4/29-5/04/2018: Hgb 7.1.  Transfused 2 units PRBCs. Anticoagulation discontinued.  PHYSICAL EXAM: Well nourished AA female laying supping in hospital bed. Opens her eyes to loud voice, touch. VS: BP 150/74, 98% (humidified oxygen 28%), HR 91, RR 16  HEENT: , AT LUNGS: harsh insp/expiratory rhonchorous  sounds referred from upper airway. CARDIAC: RRR without MRG ABD: left upper abdominal PEG EXTREMITIES: peripheral pulses intact; no pedal edema.  SKIN: Bilatereal hand contractures; left hand splint in place.  NEURO:  Opens eyes to my loud voice/touch; doesn't track with her eyes. No spontaneous movement.   Violeta Gelinas NP-C

## 2017-11-27 ENCOUNTER — Non-Acute Institutional Stay: Payer: Self-pay | Admitting: Hospice and Palliative Medicine

## 2017-11-27 DIAGNOSIS — Z515 Encounter for palliative care: Secondary | ICD-10-CM

## 2017-11-27 NOTE — Progress Notes (Signed)
PALLIATIVE CARE CONSULT VISIT   PATIENT NAMELucendia [name] DOB: May 22, 1956 MRN: [id number]  PRIMARY CARE PROVIDER:   Nolene Ebbs, MD  REFERRING PROVIDER:  Dr. Delfina Redwood     RESPONSIBLE PARTY:   Royston Bake (daughter) [phone]. (H) [phone]    RECOMMENDATIONS and PLAN:  1. Continue supportive care for now.  2. DNR 3. Consider hospice and do not hospitalize order  I spent 61 minutes providing this consultation,  from 0830 to 0935. More than 50% of the time in this consultation was spent coordinating communication.   HISTORY OF PRESENT ILLNESS:  [name] is a 62 yo AA female resident of [place], who is in a persistent vegetative state following hospitalization Jan-March earlier this year. Prior lifestyle of cocaine abuse exacerbated  her underlying  medical conditions of uncontrolled DM, HTN, and CKD, resulting in multi admissions (5 times over the last 6 months) for hypertensive crises, intracranial hemorrhages, watershed strokes, and  status epilepticus. Recent hospital admission for anemia/blood loss/sepsis from pneumonia. Palliative care has been requested to follow at SNF to continue conversations with family about goals.   No changes reported by staff since patient was last seen by our team. I called and spoke with patient's daughter, [name], who says she is acting as Recruitment consultant. Please reference previous PMT notes regarding family history. However, Tiffany lives out of state and last saw patient in July 2018, at that time patient was still ambulatory and could communicate. Tiffany was able to verbalize to me an understanding of patient's severe brain disease and that patient would not recover cognition or functioning. She does not think patient's quality of life would be one that patient would want. She plans to visit this summer and talked about making a decision to stop care and let patient pass. However, she says she does not feel she can  make any decisions to stop care until a point that she could afford to pay for the funeral. In the interim, she says that she would not want patient to be resuscitated or placed back on a ventilator. In the event of decline, she says she would be ok with comfort measures only. I left a message with the facility SW to help coordinate DNR per facility policy.    CODE STATUS: DNR  PPS: 10% HOSPICE ELIGIBILITY/DIAGNOSIS: TBD  PAST MEDICAL HISTORY:  Past Medical History:  Diagnosis Date  . Acute deep vein thrombosis (DVT) of right upper extremity ([place]) 08/30/2017  . Chronic diastolic CHF (congestive heart failure) ([place]) 08/30/2017  . Cocaine abuse, continuous ([place])   . Diabetes mellitus    Hb A1C = 12.6 on 05/15/11, managed on Novolog 70/30, 35 U qam, 25 U qpm  . Hypertension    poorly controlled  . Insomnia disorder   . Shortness of breath   . Stroke [facility])     SOCIAL HX:  Social History   Tobacco Use  . Smoking status: Current Every Day Smoker    Packs/day: 1.50    Years: 25.00    Pack years: 37.50    Types: Cigarettes  . Smokeless tobacco: Never Used  Substance Use Topics  . Alcohol use: Yes    Alcohol/week: 4.8 oz    Types: 2 Glasses of wine, 6 Cans of beer per week    Comment: pint of liquor a week    ALLERGIES: No Known Allergies   PERTINENT MEDICATIONS:  Outpatient Encounter Medications as of 61/21/2019  Medication Sig  . acetaminophen (  TYLENOL) 160 MG/5ML solution Place 10.2 mLs (325 mg total) into feeding tube every 6 (six) hours as needed for mild pain, headache or fever.  Marland Kitchen albuterol (PROVENTIL) (2.5 MG/3ML) 0.083% nebulizer solution Take 3 mLs (2.5 mg total) by nebulization every 2 (two) hours as needed for wheezing.  . Amino Acids-Protein Hydrolys (FEEDING SUPPLEMENT, PRO-STAT SUGAR FREE 64,) LIQD Place 30 mLs into feeding tube 3 (three) times daily. (Patient taking differently: Place 30 mLs into feeding tube 2 (two) times daily. )  . amLODipine (NORVASC) 10 MG tablet  Place 1 tablet (10 mg total) into feeding tube daily.  Marland Kitchen atorvastatin (LIPITOR) 40 MG tablet Place 1 tablet (40 mg total) into feeding tube daily at 6 PM.  . cloNIDine (CATAPRES) 0.2 MG tablet Place 1 tablet (0.2 mg total) into feeding tube 3 (three) times daily.  Marland Kitchen docusate (COLACE) 50 MG/5ML liquid Place 5 mLs (50 mg total) into feeding tube 2 (two) times daily as needed for mild constipation.  . folic acid (FOLVITE) 1 MG tablet Take 1 tablet (1 mg total) by mouth daily.  . hydrALAZINE (APRESOLINE) 100 MG tablet Place 1 tablet (100 mg total) into feeding tube every 8 (eight) hours.  . insulin glargine (LANTUS) 100 UNIT/ML injection Inject 0.4 mLs (40 Units total) into the skin 2 (two) times daily.  . insulin lispro (HUMALOG) 100 UNIT/ML injection Inject 2-10 Units into the skin 4 (four) times daily. Per sliding scale. 201-250=2 units, 251-300=4 units, 301-350=6 units, 351-400=8 units, 401-450=10 units > 450 Call MD.  . labetalol (NORMODYNE) 200 MG tablet Place 2 tablets (400 mg total) into feeding tube 3 (three) times daily.  Marland Kitchen lacosamide (VIMPAT) 200 MG TABS tablet Place 1 tablet (200 mg total) into feeding tube 2 (two) times daily.  Marland Kitchen levETIRAcetam (KEPPRA) 100 MG/ML solution Place 10 mLs (1,000 mg total) into feeding tube 2 (two) times daily.  Marland Kitchen LORazepam (ATIVAN) 2 MG/ML injection Inject 0.5 mLs (1 mg total) into the vein as needed for seizure (Give if seizure lasts longer than 5 minutes).  . Multiple Vitamin (MULTIVITAMIN+) LIQD Take 15 mLs by mouth daily.  . nutrition supplement, JUVEN, (JUVEN) PACK Place 1 packet into feeding tube 2 (two) times daily between meals.  . pantoprazole sodium (PROTONIX) 40 mg/20 mL PACK Place 20 mLs (40 mg total) into feeding tube at bedtime.  . phenytoin (DILANTIN) 125 MG/5ML suspension Place 250 mg into feeding tube 2 (two) times daily.  Marland Kitchen scopolamine (TRANSDERM-SCOP) 1 MG/3DAYS Place 1 patch (1.5 mg total) onto the skin every 3 (three) days.  Marland Kitchen thiamine 100  MG tablet Place 1 tablet (100 mg total) into feeding tube daily.  Marland Kitchen topiramate (TOPAMAX) 200 MG tablet Place 1 tablet (200 mg total) into feeding tube 2 (two) times daily.   No facility-administered encounter medications on file as of 61/21/2019.     PHYSICAL EXAM:   General: NAD, frail appearing, ill appearing Pulmonary: unlabored, trach and collar on O2 Abdomen: PEG noted  Skin: wounds noted but not visualized Neurological: eyes open but unresponsive  Irean Hong, NP

## 2017-12-06 ENCOUNTER — Non-Acute Institutional Stay: Payer: Self-pay | Admitting: Hospice and Palliative Medicine

## 2017-12-06 DIAGNOSIS — Z515 Encounter for palliative care: Secondary | ICD-10-CM

## 2017-12-06 NOTE — Progress Notes (Signed)
PALLIATIVE CARE CONSULT VISIT   PATIENT NAMEEverette [name] DOB: 21-Apr-1956 MRN: [id number]  PRIMARY CARE PROVIDER:   Nolene Ebbs, MD  REFERRING PROVIDER:  Dr. Delfina [name]     RESPONSIBLE PARTY:   [name] (daughter) [phone]  ASSESSMENT:  Patient has had recent fever (tmax of 103) and leukocytosis of 24k with negative urine. CXR was obtained? But staff are unable to locate the results. Wounds are another likely source of infection.   I tried calling patient's daughter but did not reach her. Will try to clarify goals. Case discussed with SW.    RECOMMENDATIONS and PLAN:  1. Now DNR 2. Would recommend hospice  I spent 15 minutes providing this consultation,  from 1100 to 1115. More than 50% of the time in this consultation was spent coordinating communication.   HISTORY OF PRESENT ILLNESS:  [name] is a 62 yo AA female resident of [place], who is in a persistent vegetative state following hospitalization Jan-March earlier this year. Prior lifestyle of cocaine abuse exacerbated  her underlying  medical conditions of uncontrolled DM, HTN, and CKD, resulting in multi admissions (5 times over the last 6 months) for hypertensive crises, intracranial hemorrhages, watershed strokes, and  status epilepticus. Recent hospital admission for anemia/blood loss/sepsis from pneumonia. Palliative care has been requested to follow at SNF to continue conversations with family about goals.   CODE STATUS: DNR  PPS: 10% HOSPICE ELIGIBILITY/DIAGNOSIS: TBD  PAST MEDICAL HISTORY:  Past Medical History:  Diagnosis Date  . Acute deep vein thrombosis (DVT) of right upper extremity ([place]) 08/30/2017  . Chronic diastolic CHF (congestive heart failure) ([place]) 08/30/2017  . Cocaine abuse, continuous ([place])   . Diabetes mellitus    Hb A1C = 12.6 on 05/15/11, managed on Novolog 70/30, 35 U qam, 25 U qpm  . Hypertension    poorly controlled  . Insomnia disorder   . Shortness of breath     . Stroke [facility])     SOCIAL HX:  Social History   Tobacco Use  . Smoking status: Current Every Day Smoker    Packs/day: 1.50    Years: 25.00    Pack years: 37.50    Types: Cigarettes  . Smokeless tobacco: Never Used  Substance Use Topics  . Alcohol use: Yes    Alcohol/week: 4.8 oz    Types: 2 Glasses of wine, 6 Cans of beer per week    Comment: pint of liquor a week    ALLERGIES: No Known Allergies   PERTINENT MEDICATIONS:  Outpatient Encounter Medications as of 12/06/2017  Medication Sig  . acetaminophen (TYLENOL) 160 MG/5ML solution Place 10.2 mLs (325 mg total) into feeding tube every 6 (six) hours as needed for mild pain, headache or fever.  Marland Kitchen albuterol (PROVENTIL) (2.5 MG/3ML) 0.083% nebulizer solution Take 3 mLs (2.5 mg total) by nebulization every 2 (two) hours as needed for wheezing.  . Amino Acids-Protein Hydrolys (FEEDING SUPPLEMENT, PRO-STAT SUGAR FREE 64,) LIQD Place 30 mLs into feeding tube 3 (three) times daily. (Patient taking differently: Place 30 mLs into feeding tube 2 (two) times daily. )  . amLODipine (NORVASC) 10 MG tablet Place 1 tablet (10 mg total) into feeding tube daily.  Marland Kitchen atorvastatin (LIPITOR) 40 MG tablet Place 1 tablet (40 mg total) into feeding tube daily at 6 PM.  . cloNIDine (CATAPRES) 0.2 MG tablet Place 1 tablet (0.2 mg total) into feeding tube 3 (three) times daily.  Marland Kitchen docusate (COLACE) 50 MG/5ML liquid Place 5 mLs (  50 mg total) into feeding tube 2 (two) times daily as needed for mild constipation.  . folic acid (FOLVITE) 1 MG tablet Take 1 tablet (1 mg total) by mouth daily.  . hydrALAZINE (APRESOLINE) 100 MG tablet Place 1 tablet (100 mg total) into feeding tube every 8 (eight) hours.  . insulin glargine (LANTUS) 100 UNIT/ML injection Inject 0.4 mLs (40 Units total) into the skin 2 (two) times daily.  . insulin lispro (HUMALOG) 100 UNIT/ML injection Inject 2-10 Units into the skin 4 (four) times daily. Per sliding scale. 201-250=2 units,  251-300=4 units, 301-350=6 units, 351-400=8 units, 401-450=10 units > 450 Call MD.  . labetalol (NORMODYNE) 200 MG tablet Place 2 tablets (400 mg total) into feeding tube 3 (three) times daily.  Marland Kitchen lacosamide (VIMPAT) 200 MG TABS tablet Place 1 tablet (200 mg total) into feeding tube 2 (two) times daily.  Marland Kitchen levETIRAcetam (KEPPRA) 100 MG/ML solution Place 10 mLs (1,000 mg total) into feeding tube 2 (two) times daily.  Marland Kitchen LORazepam (ATIVAN) 2 MG/ML injection Inject 0.5 mLs (1 mg total) into the vein as needed for seizure (Give if seizure lasts longer than 5 minutes).  . Multiple Vitamin (MULTIVITAMIN+) LIQD Take 15 mLs by mouth daily.  . nutrition supplement, JUVEN, (JUVEN) PACK Place 1 packet into feeding tube 2 (two) times daily between meals.  . pantoprazole sodium (PROTONIX) 40 mg/20 mL PACK Place 20 mLs (40 mg total) into feeding tube at bedtime.  . phenytoin (DILANTIN) 125 MG/5ML suspension Place 250 mg into feeding tube 2 (two) times daily.  Marland Kitchen scopolamine (TRANSDERM-SCOP) 1 MG/3DAYS Place 1 patch (1.5 mg total) onto the skin every 3 (three) days.  Marland Kitchen thiamine 100 MG tablet Place 1 tablet (100 mg total) into feeding tube daily.  Marland Kitchen topiramate (TOPAMAX) 200 MG tablet Place 1 tablet (200 mg total) into feeding tube 2 (two) times daily.   No facility-administered encounter medications on file as of 12/06/2017.     PHYSICAL EXAM:   General: NAD, frail appearing, ill appearing Pulmonary: unlabored, trach and collar on O2, coarse BS ant fields Abdomen: PEG noted  Skin: wounds noted but not visualized Neurological: eyes open but unresponsive  Irean Hong, NP

## 2017-12-09 ENCOUNTER — Encounter (HOSPITAL_COMMUNITY): Payer: Self-pay | Admitting: *Deleted

## 2017-12-09 ENCOUNTER — Inpatient Hospital Stay (HOSPITAL_COMMUNITY)
Admission: EM | Admit: 2017-12-09 | Discharge: 2017-12-18 | DRG: 871 | Disposition: A | Discharge status: Skilled Nursing Facility | Payer: Medicaid Other | Attending: Internal Medicine | Admitting: Internal Medicine

## 2017-12-09 ENCOUNTER — Other Ambulatory Visit: Payer: Self-pay

## 2017-12-09 ENCOUNTER — Emergency Department (HOSPITAL_COMMUNITY): Payer: Medicaid Other

## 2017-12-09 DIAGNOSIS — N183 Chronic kidney disease, stage 3 (moderate): Secondary | ICD-10-CM | POA: Diagnosis present

## 2017-12-09 DIAGNOSIS — Z794 Long term (current) use of insulin: Secondary | ICD-10-CM

## 2017-12-09 DIAGNOSIS — Z8701 Personal history of pneumonia (recurrent): Secondary | ICD-10-CM

## 2017-12-09 DIAGNOSIS — E785 Hyperlipidemia, unspecified: Secondary | ICD-10-CM | POA: Diagnosis present

## 2017-12-09 DIAGNOSIS — E870 Hyperosmolality and hypernatremia: Secondary | ICD-10-CM | POA: Diagnosis present

## 2017-12-09 DIAGNOSIS — A4902 Methicillin resistant Staphylococcus aureus infection, unspecified site: Secondary | ICD-10-CM | POA: Diagnosis present

## 2017-12-09 DIAGNOSIS — B9562 Methicillin resistant Staphylococcus aureus infection as the cause of diseases classified elsewhere: Secondary | ICD-10-CM | POA: Diagnosis present

## 2017-12-09 DIAGNOSIS — Z930 Tracheostomy status: Secondary | ICD-10-CM

## 2017-12-09 DIAGNOSIS — K7200 Acute and subacute hepatic failure without coma: Secondary | ICD-10-CM | POA: Diagnosis present

## 2017-12-09 DIAGNOSIS — E118 Type 2 diabetes mellitus with unspecified complications: Secondary | ICD-10-CM

## 2017-12-09 DIAGNOSIS — E872 Acidosis: Secondary | ICD-10-CM | POA: Diagnosis present

## 2017-12-09 DIAGNOSIS — N179 Acute kidney failure, unspecified: Secondary | ICD-10-CM

## 2017-12-09 DIAGNOSIS — Z7401 Bed confinement status: Secondary | ICD-10-CM

## 2017-12-09 DIAGNOSIS — E1165 Type 2 diabetes mellitus with hyperglycemia: Secondary | ICD-10-CM | POA: Diagnosis not present

## 2017-12-09 DIAGNOSIS — N390 Urinary tract infection, site not specified: Secondary | ICD-10-CM | POA: Diagnosis present

## 2017-12-09 DIAGNOSIS — IMO0002 Reserved for concepts with insufficient information to code with codable children: Secondary | ICD-10-CM | POA: Diagnosis present

## 2017-12-09 DIAGNOSIS — I69318 Other symptoms and signs involving cognitive functions following cerebral infarction: Secondary | ICD-10-CM | POA: Diagnosis not present

## 2017-12-09 DIAGNOSIS — R740 Nonspecific elevation of levels of transaminase and lactic acid dehydrogenase [LDH]: Secondary | ICD-10-CM

## 2017-12-09 DIAGNOSIS — A4159 Other Gram-negative sepsis: Principal | ICD-10-CM | POA: Diagnosis present

## 2017-12-09 DIAGNOSIS — L98423 Non-pressure chronic ulcer of back with necrosis of muscle: Secondary | ICD-10-CM | POA: Diagnosis not present

## 2017-12-09 DIAGNOSIS — E1142 Type 2 diabetes mellitus with diabetic polyneuropathy: Secondary | ICD-10-CM | POA: Diagnosis present

## 2017-12-09 DIAGNOSIS — R403 Persistent vegetative state: Secondary | ICD-10-CM | POA: Diagnosis present

## 2017-12-09 DIAGNOSIS — I10 Essential (primary) hypertension: Secondary | ICD-10-CM | POA: Diagnosis present

## 2017-12-09 DIAGNOSIS — E860 Dehydration: Secondary | ICD-10-CM | POA: Diagnosis present

## 2017-12-09 DIAGNOSIS — R6510 Systemic inflammatory response syndrome (SIRS) of non-infectious origin without acute organ dysfunction: Secondary | ICD-10-CM

## 2017-12-09 DIAGNOSIS — R6520 Severe sepsis without septic shock: Secondary | ICD-10-CM | POA: Diagnosis present

## 2017-12-09 DIAGNOSIS — Z8673 Personal history of transient ischemic attack (TIA), and cerebral infarction without residual deficits: Secondary | ICD-10-CM

## 2017-12-09 DIAGNOSIS — R7401 Elevation of levels of liver transaminase levels: Secondary | ICD-10-CM

## 2017-12-09 DIAGNOSIS — A419 Sepsis, unspecified organism: Secondary | ICD-10-CM | POA: Diagnosis present

## 2017-12-09 DIAGNOSIS — I5032 Chronic diastolic (congestive) heart failure: Secondary | ICD-10-CM | POA: Diagnosis not present

## 2017-12-09 DIAGNOSIS — Z823 Family history of stroke: Secondary | ICD-10-CM

## 2017-12-09 DIAGNOSIS — B182 Chronic viral hepatitis C: Secondary | ICD-10-CM | POA: Diagnosis present

## 2017-12-09 DIAGNOSIS — K219 Gastro-esophageal reflux disease without esophagitis: Secondary | ICD-10-CM | POA: Diagnosis present

## 2017-12-09 DIAGNOSIS — D649 Anemia, unspecified: Secondary | ICD-10-CM | POA: Diagnosis present

## 2017-12-09 DIAGNOSIS — E1122 Type 2 diabetes mellitus with diabetic chronic kidney disease: Secondary | ICD-10-CM | POA: Diagnosis present

## 2017-12-09 DIAGNOSIS — Z79899 Other long term (current) drug therapy: Secondary | ICD-10-CM

## 2017-12-09 DIAGNOSIS — Z515 Encounter for palliative care: Secondary | ICD-10-CM

## 2017-12-09 DIAGNOSIS — Z931 Gastrostomy status: Secondary | ICD-10-CM

## 2017-12-09 DIAGNOSIS — E875 Hyperkalemia: Secondary | ICD-10-CM | POA: Diagnosis not present

## 2017-12-09 DIAGNOSIS — L97919 Non-pressure chronic ulcer of unspecified part of right lower leg with unspecified severity: Secondary | ICD-10-CM | POA: Diagnosis present

## 2017-12-09 DIAGNOSIS — I130 Hypertensive heart and chronic kidney disease with heart failure and stage 1 through stage 4 chronic kidney disease, or unspecified chronic kidney disease: Secondary | ICD-10-CM | POA: Diagnosis present

## 2017-12-09 DIAGNOSIS — G40901 Epilepsy, unspecified, not intractable, with status epilepticus: Secondary | ICD-10-CM | POA: Diagnosis present

## 2017-12-09 DIAGNOSIS — L89154 Pressure ulcer of sacral region, stage 4: Secondary | ICD-10-CM | POA: Diagnosis present

## 2017-12-09 DIAGNOSIS — I69319 Unspecified symptoms and signs involving cognitive functions following cerebral infarction: Secondary | ICD-10-CM | POA: Diagnosis not present

## 2017-12-09 DIAGNOSIS — R739 Hyperglycemia, unspecified: Secondary | ICD-10-CM

## 2017-12-09 DIAGNOSIS — L98429 Non-pressure chronic ulcer of back with unspecified severity: Secondary | ICD-10-CM

## 2017-12-09 DIAGNOSIS — J9611 Chronic respiratory failure with hypoxia: Secondary | ICD-10-CM | POA: Diagnosis present

## 2017-12-09 DIAGNOSIS — E871 Hypo-osmolality and hyponatremia: Secondary | ICD-10-CM | POA: Diagnosis present

## 2017-12-09 DIAGNOSIS — Z66 Do not resuscitate: Secondary | ICD-10-CM | POA: Diagnosis present

## 2017-12-09 DIAGNOSIS — E876 Hypokalemia: Secondary | ICD-10-CM | POA: Diagnosis present

## 2017-12-09 DIAGNOSIS — Z833 Family history of diabetes mellitus: Secondary | ICD-10-CM

## 2017-12-09 DIAGNOSIS — D62 Acute posthemorrhagic anemia: Secondary | ICD-10-CM | POA: Diagnosis present

## 2017-12-09 DIAGNOSIS — Z86718 Personal history of other venous thrombosis and embolism: Secondary | ICD-10-CM

## 2017-12-09 DIAGNOSIS — F17210 Nicotine dependence, cigarettes, uncomplicated: Secondary | ICD-10-CM | POA: Diagnosis present

## 2017-12-09 DIAGNOSIS — Z8614 Personal history of Methicillin resistant Staphylococcus aureus infection: Secondary | ICD-10-CM

## 2017-12-09 DIAGNOSIS — D638 Anemia in other chronic diseases classified elsewhere: Secondary | ICD-10-CM | POA: Diagnosis present

## 2017-12-09 DIAGNOSIS — E1129 Type 2 diabetes mellitus with other diabetic kidney complication: Secondary | ICD-10-CM | POA: Diagnosis present

## 2017-12-09 DIAGNOSIS — A4151 Sepsis due to Escherichia coli [E. coli]: Secondary | ICD-10-CM

## 2017-12-09 LAB — CBC WITH DIFFERENTIAL/PLATELET
Basophils Absolute: 0 10*3/uL (ref 0.0–0.1)
Basophils Relative: 0 %
EOS PCT: 0 %
Eosinophils Absolute: 0 10*3/uL (ref 0.0–0.7)
HCT: 30 % — ABNORMAL LOW (ref 36.0–46.0)
HEMOGLOBIN: 8.7 g/dL — AB (ref 12.0–15.0)
LYMPHS PCT: 15 %
Lymphs Abs: 4.2 10*3/uL — ABNORMAL HIGH (ref 0.7–4.0)
MCH: 30.4 pg (ref 26.0–34.0)
MCHC: 29 g/dL — ABNORMAL LOW (ref 30.0–36.0)
MCV: 104.9 fL — AB (ref 78.0–100.0)
MONO ABS: 1.7 10*3/uL — AB (ref 0.1–1.0)
MONOS PCT: 6 %
NEUTROS PCT: 79 %
Neutro Abs: 22 10*3/uL — ABNORMAL HIGH (ref 1.7–7.7)
PLATELETS: 291 10*3/uL (ref 150–400)
RBC: 2.86 MIL/uL — AB (ref 3.87–5.11)
RDW: 15.7 % — ABNORMAL HIGH (ref 11.5–15.5)
WBC: 27.9 10*3/uL — AB (ref 4.0–10.5)

## 2017-12-09 LAB — COMPREHENSIVE METABOLIC PANEL
ALBUMIN: 1.7 g/dL — AB (ref 3.5–5.0)
ALT: 157 U/L — ABNORMAL HIGH (ref 14–54)
ANION GAP: 14 (ref 5–15)
AST: 398 U/L — ABNORMAL HIGH (ref 15–41)
Alkaline Phosphatase: 57 U/L (ref 38–126)
BUN: 189 mg/dL — ABNORMAL HIGH (ref 6–20)
CALCIUM: 8.8 mg/dL — AB (ref 8.9–10.3)
CHLORIDE: 119 mmol/L — AB (ref 101–111)
CO2: 23 mmol/L (ref 22–32)
Creatinine, Ser: 2.49 mg/dL — ABNORMAL HIGH (ref 0.44–1.00)
GFR calc non Af Amer: 20 mL/min — ABNORMAL LOW (ref >60)
GFR, EST AFRICAN AMERICAN: 23 mL/min — AB (ref >60)
GLUCOSE: 594 mg/dL — AB (ref 65–99)
POTASSIUM: 4.6 mmol/L (ref 3.5–5.1)
SODIUM: 156 mmol/L — AB (ref 135–145)
Total Bilirubin: 0.5 mg/dL (ref 0.3–1.2)
Total Protein: 7.8 g/dL (ref 6.5–8.1)

## 2017-12-09 LAB — URINALYSIS, ROUTINE W REFLEX MICROSCOPIC
Bilirubin Urine: NEGATIVE
GLUCOSE, UA: 50 mg/dL — AB
HGB URINE DIPSTICK: NEGATIVE
Ketones, ur: NEGATIVE mg/dL
NITRITE: NEGATIVE
Protein, ur: >=300 mg/dL — AB
Specific Gravity, Urine: 1.016 (ref 1.005–1.030)
pH: 9 — ABNORMAL HIGH (ref 5.0–8.0)

## 2017-12-09 LAB — BASIC METABOLIC PANEL
Anion gap: 14 (ref 5–15)
BUN: 185 mg/dL — ABNORMAL HIGH (ref 6–20)
CHLORIDE: 116 mmol/L — AB (ref 101–111)
CO2: 20 mmol/L — ABNORMAL LOW (ref 22–32)
Calcium: 8.1 mg/dL — ABNORMAL LOW (ref 8.9–10.3)
Creatinine, Ser: 2.36 mg/dL — ABNORMAL HIGH (ref 0.44–1.00)
GFR calc Af Amer: 24 mL/min — ABNORMAL LOW (ref >60)
GFR calc non Af Amer: 21 mL/min — ABNORMAL LOW (ref >60)
Glucose, Bld: 524 mg/dL (ref 65–99)
Potassium: 3.8 mmol/L (ref 3.5–5.1)
SODIUM: 150 mmol/L — AB (ref 135–145)

## 2017-12-09 LAB — PROTIME-INR
INR: 1.55
INR: 1.65
Prothrombin Time: 18.4 seconds — ABNORMAL HIGH (ref 11.4–15.2)
Prothrombin Time: 19.4 seconds — ABNORMAL HIGH (ref 11.4–15.2)

## 2017-12-09 LAB — PROCALCITONIN: PROCALCITONIN: 2.3 ng/mL

## 2017-12-09 LAB — ACETAMINOPHEN LEVEL

## 2017-12-09 LAB — APTT: APTT: 29 s (ref 24–36)

## 2017-12-09 LAB — I-STAT CG4 LACTIC ACID, ED: Lactic Acid, Venous: 1.55 mmol/L (ref 0.5–1.9)

## 2017-12-09 LAB — GLUCOSE, CAPILLARY
GLUCOSE-CAPILLARY: 421 mg/dL — AB (ref 65–99)
Glucose-Capillary: 350 mg/dL — ABNORMAL HIGH (ref 65–99)

## 2017-12-09 LAB — CBG MONITORING, ED
GLUCOSE-CAPILLARY: 499 mg/dL — AB (ref 65–99)
Glucose-Capillary: 538 mg/dL (ref 65–99)
Glucose-Capillary: 497 mg/dL — ABNORMAL HIGH (ref 65–99)

## 2017-12-09 LAB — POC OCCULT BLOOD, ED: FECAL OCCULT BLD: POSITIVE — AB

## 2017-12-09 LAB — ABO/RH: ABO/RH(D): O POS

## 2017-12-09 LAB — I-STAT TROPONIN, ED: Troponin i, poc: 0.1 ng/mL (ref 0.00–0.08)

## 2017-12-09 LAB — ETHANOL

## 2017-12-09 MED ORDER — SCOPOLAMINE 1 MG/3DAYS TD PT72
1.0000 | MEDICATED_PATCH | TRANSDERMAL | Status: DC
  Administered 2017-12-09 – 2017-12-18 (×4): 1.5 mg via TRANSDERMAL
  Filled 2017-12-09 (×4): qty 1

## 2017-12-09 MED ORDER — ACETAMINOPHEN 650 MG RE SUPP: 650.0000 mg | Freq: Four times a day (QID) | RECTAL | Status: DC | PRN

## 2017-12-09 MED ORDER — PHENYTOIN 125 MG/5ML PO SUSP
250.0000 mg | Freq: Two times a day (BID) | ORAL | Status: DC
  Administered 2017-12-09 – 2017-12-18 (×19): 250 mg
  Filled 2017-12-09 (×21): qty 10

## 2017-12-09 MED ORDER — AMLODIPINE BESYLATE 10 MG PO TABS
10.0000 mg | ORAL_TABLET | Freq: Every day | ORAL | Status: DC
  Administered 2017-12-09 – 2017-12-16 (×8): 10 mg
  Filled 2017-12-09: qty 1
  Filled 2017-12-09: qty 2
  Filled 2017-12-09 (×6): qty 1

## 2017-12-09 MED ORDER — HYDROCODONE-ACETAMINOPHEN 5-325 MG PO TABS
1.0000 | ORAL_TABLET | ORAL | Status: DC | PRN
  Administered 2017-12-11: 1 via ORAL
  Administered 2017-12-12 – 2017-12-14 (×5): 2 via ORAL
  Filled 2017-12-09 (×5): qty 2
  Filled 2017-12-09: qty 1

## 2017-12-09 MED ORDER — VANCOMYCIN HCL IN DEXTROSE 1-5 GM/200ML-% IV SOLN
1000.0000 mg | INTRAVENOUS | Status: DC
  Filled 2017-12-09: qty 200

## 2017-12-09 MED ORDER — VITAMIN B-1 100 MG PO TABS
100.0000 mg | ORAL_TABLET | Freq: Every day | ORAL | Status: DC
  Administered 2017-12-09 – 2017-12-16 (×8): 100 mg
  Filled 2017-12-09 (×8): qty 1

## 2017-12-09 MED ORDER — TOPIRAMATE 100 MG PO TABS
200.0000 mg | ORAL_TABLET | Freq: Two times a day (BID) | ORAL | Status: DC
  Administered 2017-12-09 – 2017-12-14 (×11): 200 mg
  Filled 2017-12-09 (×3): qty 8
  Filled 2017-12-09 (×2): qty 2
  Filled 2017-12-09: qty 8
  Filled 2017-12-09: qty 2
  Filled 2017-12-09: qty 8
  Filled 2017-12-09 (×4): qty 2
  Filled 2017-12-09: qty 8

## 2017-12-09 MED ORDER — ONDANSETRON HCL 4 MG/2ML IJ SOLN: 4.0000 mg | Freq: Four times a day (QID) | INTRAMUSCULAR | Status: DC | PRN

## 2017-12-09 MED ORDER — ALBUTEROL SULFATE (2.5 MG/3ML) 0.083% IN NEBU: 2.5000 mg | INHALATION_SOLUTION | RESPIRATORY_TRACT | Status: DC | PRN

## 2017-12-09 MED ORDER — JUVEN PO PACK
1.0000 | PACK | Freq: Two times a day (BID) | ORAL | Status: DC
  Administered 2017-12-09 – 2017-12-16 (×13): 1
  Filled 2017-12-09 (×15): qty 1

## 2017-12-09 MED ORDER — ADULT MULTIVITAMIN LIQUID CH
15.0000 mL | Freq: Every day | ORAL | Status: DC
  Administered 2017-12-09 – 2017-12-16 (×8): 15 mL via ORAL
  Filled 2017-12-09 (×8): qty 15

## 2017-12-09 MED ORDER — PIPERACILLIN-TAZOBACTAM 3.375 G IVPB
3.3750 g | Freq: Three times a day (TID) | INTRAVENOUS | Status: DC
  Administered 2017-12-09 – 2017-12-10 (×3): 3.375 g via INTRAVENOUS
  Filled 2017-12-09 (×4): qty 50

## 2017-12-09 MED ORDER — INSULIN ASPART 100 UNIT/ML ~~LOC~~ SOLN
17.0000 [IU] | Freq: Once | SUBCUTANEOUS | Status: AC
  Administered 2017-12-09: 17 [IU] via SUBCUTANEOUS

## 2017-12-09 MED ORDER — LACOSAMIDE 50 MG PO TABS
200.0000 mg | ORAL_TABLET | Freq: Two times a day (BID) | ORAL | Status: DC
  Administered 2017-12-09 – 2017-12-13 (×10): 200 mg
  Filled 2017-12-09 (×2): qty 4
  Filled 2017-12-09: qty 1
  Filled 2017-12-09 (×6): qty 4
  Filled 2017-12-09: qty 1
  Filled 2017-12-09 (×2): qty 4

## 2017-12-09 MED ORDER — CLONIDINE HCL 0.2 MG PO TABS
0.2000 mg | ORAL_TABLET | Freq: Three times a day (TID) | ORAL | Status: DC
  Administered 2017-12-09 – 2017-12-16 (×21): 0.2 mg
  Filled 2017-12-09 (×21): qty 1

## 2017-12-09 MED ORDER — VANCOMYCIN HCL IN DEXTROSE 1-5 GM/200ML-% IV SOLN: 1000.0000 mg | Freq: Once | INTRAVENOUS | Status: DC

## 2017-12-09 MED ORDER — BISACODYL 10 MG RE SUPP: 10.0000 mg | Freq: Every day | RECTAL | Status: DC | PRN

## 2017-12-09 MED ORDER — SENNOSIDES-DOCUSATE SODIUM 8.6-50 MG PO TABS: 1.0000 | ORAL_TABLET | Freq: Every evening | ORAL | Status: DC | PRN

## 2017-12-09 MED ORDER — INSULIN ASPART 100 UNIT/ML ~~LOC~~ SOLN
0.0000 [IU] | Freq: Three times a day (TID) | SUBCUTANEOUS | Status: DC
  Administered 2017-12-09 (×2): 15 [IU] via SUBCUTANEOUS
  Administered 2017-12-10 (×2): 5 [IU] via SUBCUTANEOUS
  Administered 2017-12-10: 11 [IU] via SUBCUTANEOUS
  Filled 2017-12-09: qty 1

## 2017-12-09 MED ORDER — SODIUM CHLORIDE 0.45 % IV SOLN
INTRAVENOUS | Status: DC
  Administered 2017-12-09: 19:00:00 via INTRAVENOUS
  Administered 2017-12-09: 100 mL/h via INTRAVENOUS
  Administered 2017-12-10: 10:00:00 via INTRAVENOUS

## 2017-12-09 MED ORDER — HYDRALAZINE HCL 50 MG PO TABS
100.0000 mg | ORAL_TABLET | Freq: Three times a day (TID) | ORAL | Status: DC
  Administered 2017-12-09 – 2017-12-16 (×21): 100 mg
  Filled 2017-12-09 (×21): qty 2

## 2017-12-09 MED ORDER — INSULIN ASPART 100 UNIT/ML ~~LOC~~ SOLN
20.0000 [IU] | Freq: Once | SUBCUTANEOUS | Status: AC
  Administered 2017-12-09: 20 [IU] via SUBCUTANEOUS
  Filled 2017-12-09: qty 1

## 2017-12-09 MED ORDER — ACETAMINOPHEN 325 MG PO TABS: 650.0000 mg | ORAL_TABLET | Freq: Four times a day (QID) | ORAL | Status: DC | PRN

## 2017-12-09 MED ORDER — SODIUM CHLORIDE 0.9 % IV SOLN
1500.0000 mg | Freq: Once | INTRAVENOUS | Status: AC
  Administered 2017-12-09: 1500 mg via INTRAVENOUS
  Filled 2017-12-09: qty 1500

## 2017-12-09 MED ORDER — PRO-STAT SUGAR FREE PO LIQD
30.0000 mL | Freq: Three times a day (TID) | ORAL | Status: DC
  Administered 2017-12-09 – 2017-12-16 (×22): 30 mL
  Filled 2017-12-09 (×22): qty 30

## 2017-12-09 MED ORDER — LORAZEPAM 2 MG/ML IJ SOLN: 1.0000 mg | INTRAMUSCULAR | Status: DC | PRN

## 2017-12-09 MED ORDER — ONDANSETRON HCL 4 MG PO TABS: 4.0000 mg | ORAL_TABLET | Freq: Four times a day (QID) | ORAL | Status: DC | PRN

## 2017-12-09 MED ORDER — PANTOPRAZOLE SODIUM 40 MG PO PACK
40.0000 mg | PACK | Freq: Every day | ORAL | Status: DC
  Administered 2017-12-09 – 2017-12-15 (×7): 40 mg
  Filled 2017-12-09 (×7): qty 20

## 2017-12-09 MED ORDER — DOCUSATE SODIUM 50 MG/5ML PO LIQD
50.0000 mg | Freq: Two times a day (BID) | ORAL | Status: DC | PRN
  Administered 2017-12-09: 50 mg
  Filled 2017-12-09: qty 10

## 2017-12-09 MED ORDER — FOLIC ACID 1 MG PO TABS
1.0000 mg | ORAL_TABLET | Freq: Every day | ORAL | Status: DC
  Administered 2017-12-09 – 2017-12-16 (×8): 1 mg via ORAL
  Filled 2017-12-09 (×8): qty 1

## 2017-12-09 MED ORDER — LABETALOL HCL 200 MG PO TABS
400.0000 mg | ORAL_TABLET | Freq: Three times a day (TID) | ORAL | Status: DC
  Administered 2017-12-09 – 2017-12-18 (×28): 400 mg
  Filled 2017-12-09 (×28): qty 2

## 2017-12-09 MED ORDER — INSULIN GLARGINE 100 UNIT/ML ~~LOC~~ SOLN
40.0000 [IU] | Freq: Two times a day (BID) | SUBCUTANEOUS | Status: DC
  Administered 2017-12-09 – 2017-12-10 (×3): 40 [IU] via SUBCUTANEOUS
  Filled 2017-12-09 (×5): qty 0.4

## 2017-12-09 MED ORDER — ATORVASTATIN CALCIUM 40 MG PO TABS
40.0000 mg | ORAL_TABLET | Freq: Every day | ORAL | Status: DC
  Administered 2017-12-09 – 2017-12-10 (×2): 40 mg
  Filled 2017-12-09 (×2): qty 1

## 2017-12-09 MED ORDER — PIPERACILLIN-TAZOBACTAM 3.375 G IVPB 30 MIN
3.3750 g | Freq: Once | INTRAVENOUS | Status: AC
  Administered 2017-12-09: 3.375 g via INTRAVENOUS
  Filled 2017-12-09: qty 50

## 2017-12-09 MED ORDER — SODIUM CHLORIDE 0.9 % IV BOLUS (SEPSIS)
2000.0000 mL | Freq: Once | INTRAVENOUS | Status: AC
  Administered 2017-12-09: 2000 mL via INTRAVENOUS

## 2017-12-09 MED ORDER — LEVETIRACETAM 100 MG/ML PO SOLN
1000.0000 mg | Freq: Two times a day (BID) | ORAL | Status: DC
  Administered 2017-12-09 – 2017-12-18 (×19): 1000 mg
  Filled 2017-12-09 (×20): qty 10

## 2017-12-09 NOTE — ED Notes (Signed)
Pt trach suctioned of moderate amount of mucous using sterile technique.

## 2017-12-09 NOTE — ED Notes (Signed)
Attempted report 

## 2017-12-09 NOTE — Progress Notes (Signed)
Pharmacy Antibiotic Note  [name] is a 62 y.o. female admitted on 12/09/2017 with sepsis. Pharmacy has been consulted for Zosyn and vancomycin dosing.  AKI. SCr elevated at 2.49, CrCl ~20-63ml/min  Plan: Start Zosyn 3.375 gm IV q8h (4 hour infusion) Give vancomycin 1.5g IV x 1, then start vancomycin 1g IV Q24h Monitor clinical picture, renal function,  F/U C&S, abx deescalation / LOT  Height: 5\' 4"  (162.6 cm) Weight: 184 lb (83.5 kg) IBW/kg (Calculated) : 54.7  Temp (24hrs), Avg:103.5 F (39.7 C), Min:103.5 F (39.7 C), Max:103.5 F (39.7 C)  Recent Labs  Lab 12/09/17 0705  LATICACIDVEN 1.55    CrCl cannot be calculated (Patient's most recent lab result is older than the maximum 21 days allowed.).    No Known Allergies   Thank you for allowing pharmacy to be a part of this patient's care.  Reginia Naas 12/09/2017 7:10 AM

## 2017-12-09 NOTE — ED Notes (Signed)
Pharmacy was sent a note to verify the ordered 20 units of novoLog.

## 2017-12-09 NOTE — ED Triage Notes (Signed)
The pt [facility]  Trach pt   Hx pneumonia utis  Temp 103 at nh    Motrin 800mg  given at [facility]  0600a  No foley  Not really responsive normally  Full code junky lungs

## 2017-12-09 NOTE — ED Provider Notes (Addendum)
[facility] EMERGENCY DEPARTMENT Provider Note   CSN: [id number] Arrival date & time:        History   Chief Complaint Chief Complaint  Patient presents with  . Fever    HPI [name] is a 62 y.o. female with history of persistent vegetative state, previous cocaine abuse, diabetes, hypertension, intracranial hemorrhages, renal insufficiency, strokes, seizures, DVT, s/p tracheostomy and PEG, is here for evaluation of fever noted around 6 AM at nursing home.  She has history of pneumonia and UTI, nursing staff noticed a sacral ulcer today that is malodorous and draining.  Typically patient is slightly responsive to verbal stimuli, now only responsive to painful stimuli per EMS.  She received 400 cc and 800 Motrin PTA.  Per EMS patient is full code however palliative care NP notes on 5/30 documents DNR.  Level 5 caveat due to altered mental status/acuity.  History obtained from chart review.  Per EMS approx 400 cc sputum suction at nursing facility.   HPI  Past Medical History:  Diagnosis Date  . Acute deep vein thrombosis (DVT) of right upper extremity ([place]) 08/30/2017  . Chronic diastolic CHF (congestive heart failure) ([place]) 08/30/2017  . Cocaine abuse, continuous ([place])   . Diabetes mellitus    Hb A1C = 12.6 on 05/15/11, managed on Novolog 70/30, 35 U qam, 25 U qpm  . Hypertension    poorly controlled  . Insomnia disorder   . Shortness of breath   . Stroke [facility])     Patient Active Problem List   Diagnosis Date Noted  . Sacral ulcer ([place]) 12/09/2017  . 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 Goals of care, counseling/discussion   . Palliative care encounter   . Acute blood loss anemia 11/06/2017  . Persistent vegetative state ([place]) 11/06/2017  . Occult blood in stools 11/06/2017  . HCAP (healthcare-associated pneumonia) 11/06/2017  . AKI (acute kidney injury) ([place])   . Metabolic acidemia   . Symptomatic anemia 10/08/2017  . Sepsis ([place])  10/08/2017  . Acute bilat watershed infarction [facility])   . Acute respiratory failure with hypoxia ([place])   . Essential hypertension   . Diabetes mellitus type 2, uncontrolled, with complications ([place])   . Chronic diastolic CHF (congestive heart failure) ([place]) 08/30/2017  . Acute deep vein thrombosis (DVT) of right upper extremity ([place]) 08/30/2017  . Tracheostomy status ([place])   . Acute respiratory failure with hypoxemia ([place])   . Pressure injury of skin 08/04/2017  . Altered mental status   . H/O open leg wound 06/04/2017  . Open leg wound 06/04/2017  . Acute metabolic encephalopathy 58/03/9832  . Intracranial bleed ([place]) 05/26/2017  . ICH (intracerebral hemorrhage) ([place]) 05/26/2017  . Type II diabetes mellitus with renal manifestations ([place]) 05/25/2017  . SIRS (systemic inflammatory response syndrome) ([place]) 05/25/2017  . Aspiration pneumonia due to gastric secretions ([place])   . Confusion 05/10/2017  . Chronic hepatitis C without hepatic coma ([place])   . Labile blood glucose   . Hypoglycemia associated with type 2 diabetes mellitus ([place])   . Poorly controlled type 2 diabetes mellitus with peripheral neuropathy ([place])   . Neurologic gait disorder   . Neuropathic pain   . Type 2 diabetes mellitus with peripheral neuropathy (HCC)   . History of intracranial hemorrhage   . Diarrhea   . Urinary incontinence   . Hypokalemia   . Polysubstance abuse ([place])   . Cerebrovascular accident (CVA) ([place])   . Disorientation   .  Meningitis 11/02/2016  . Type 2 diabetes mellitus with hyperosmolar nonketotic hyperglycemia ([place]) 10/31/2016  . Seizure ([place]) 10/31/2016  . ARF (acute renal failure) ([place]) 10/31/2016  . Severe sepsis ([place]) 10/31/2016  . DKA (diabetic ketoacidoses) ([place]) 10/31/2016  . Acute renal failure superimposed on stage 3 chronic kidney disease ([place])   . Right hemiparesis ([employer]) 06/09/2016  . 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 Cytotoxic brain edema ([place]) 06/08/2016  . Encephalopathy  acute 06/06/2016  . Chest pain, musculoskeletal 05/16/2011  . Vaginal candidiasis 05/16/2011  . Diabetes mellitus, type 2 ([place]) 05/16/2011  . CHEST PAIN 10/01/2009  . CELLULITIS AND ABSCESS OF LEG EXCEPT FOOT 08/31/2009  . INSOMNIA 07/13/2008  . Methicillin resistant Staphylococcus aureus infection 04/17/2008  . NECK MASS 04/17/2008  . COCAINE ABUSE 11/01/2007  . OBESITY NOS 07/14/2006  . DENTAL CARIES 07/14/2006  . DIABETES MELLITUS, TYPE II 04/26/2006  . Alcohol abuse 04/26/2006  . TOBACCO ABUSE 04/26/2006  . DEPRESSION 04/26/2006  . Uncontrolled hypertension 04/26/2006  . GERD 04/26/2006  . LOW BACK PAIN 04/26/2006    Past Surgical History:  Procedure Laterality Date  . ANKLE FRACTURE SURGERY  2007  . IR GASTROSTOMY TUBE MOD SED  08/17/2017     OB History   None      Home Medications    Prior to Admission medications   Medication Sig Start Date End Date Taking? Authorizing Provider  acetaminophen (TYLENOL) 160 MG/5ML solution Place 10.2 mLs (325 mg total) into feeding tube every 6 (six) hours as needed for mild pain, headache or fever. 09/13/17   Allie Bossier, MD  albuterol (PROVENTIL) (2.5 MG/3ML) 0.083% nebulizer solution Take 3 mLs (2.5 mg total) by nebulization every 2 (two) hours as needed for wheezing. 09/13/17   Allie Bossier, MD  Amino Acids-Protein Hydrolys (FEEDING SUPPLEMENT, PRO-STAT SUGAR FREE 64,) LIQD Place 30 mLs into feeding tube 3 (three) times daily. Patient taking differently: Place 30 mLs into feeding tube 2 (two) times daily.  09/13/17   Allie Bossier, MD  amLODipine (NORVASC) 10 MG tablet Place 1 tablet (10 mg total) into feeding tube daily. 09/14/17   Allie Bossier, MD  atorvastatin (LIPITOR) 40 MG tablet Place 1 tablet (40 mg total) into feeding tube daily at 6 PM. 09/13/17   Allie Bossier, MD  cloNIDine (CATAPRES) 0.2 MG tablet Place 1 tablet (0.2 mg total) into feeding tube 3 (three) times daily. 09/13/17   Allie Bossier, MD  docusate (COLACE)  50 MG/5ML liquid Place 5 mLs (50 mg total) into feeding tube 2 (two) times daily as needed for mild constipation. 09/13/17   Allie Bossier, MD  folic acid (FOLVITE) 1 MG tablet Take 1 tablet (1 mg total) by mouth daily. 06/05/17   Aline August, MD  hydrALAZINE (APRESOLINE) 100 MG tablet Place 1 tablet (100 mg total) into feeding tube every 8 (eight) hours. 09/13/17   Allie Bossier, MD  insulin glargine (LANTUS) 100 UNIT/ML injection Inject 0.4 mLs (40 Units total) into the skin 2 (two) times daily. 10/18/17   Ghimire, Henreitta Leber, MD  insulin lispro (HUMALOG) 100 UNIT/ML injection Inject 2-10 Units into the skin 4 (four) times daily. Per sliding scale. 201-250=2 units, 251-300=4 units, 301-350=6 units, 351-400=8 units, 401-450=10 units > 450 Call MD.    [provider]  labetalol (NORMODYNE) 200 MG tablet Place 2 tablets (400 mg total) into feeding tube 3 (three) times daily. 09/13/17   Allie Bossier, MD  lacosamide (VIMPAT) 200  MG TABS tablet Place 1 tablet (200 mg total) into feeding tube 2 (two) times daily. 09/13/17   Allie Bossier, MD  levETIRAcetam (KEPPRA) 100 MG/ML solution Place 10 mLs (1,000 mg total) into feeding tube 2 (two) times daily. 09/13/17   Allie Bossier, MD  LORazepam (ATIVAN) 2 MG/ML injection Inject 0.5 mLs (1 mg total) into the vein as needed for seizure (Give if seizure lasts longer than 5 minutes). 10/18/17   Ghimire, Henreitta Leber, MD  Multiple Vitamin (MULTIVITAMIN+) LIQD Take 15 mLs by mouth daily.    [provider]  nutrition supplement, JUVEN, (JUVEN) PACK Place 1 packet into feeding tube 2 (two) times daily between meals.    [provider]  pantoprazole sodium (PROTONIX) 40 mg/20 mL PACK Place 20 mLs (40 mg total) into feeding tube at bedtime. 09/13/17   Allie Bossier, MD  phenytoin (DILANTIN) 125 MG/5ML suspension Place 250 mg into feeding tube 2 (two) times daily.    [provider]  scopolamine (TRANSDERM-SCOP) 1 MG/3DAYS Place 1 patch  (1.5 mg total) onto the skin every 3 (three) days. 09/14/17   Allie Bossier, MD  thiamine 100 MG tablet Place 1 tablet (100 mg total) into feeding tube daily. 09/14/17   Allie Bossier, MD  topiramate (TOPAMAX) 200 MG tablet Place 1 tablet (200 mg total) into feeding tube 2 (two) times daily. 09/13/17   Allie Bossier, MD    Family History Family History  Problem Relation Age of Onset  . Stroke Father   . Diabetes Sister   . Diabetes Brother     Social History Social History   Tobacco Use  . Smoking status: Current Every Day Smoker    Packs/day: 1.50    Years: 25.00    Pack years: 37.50    Types: Cigarettes  . Smokeless tobacco: Never Used  Substance Use Topics  . Alcohol use: Yes    Alcohol/week: 4.8 oz    Types: 2 Glasses of wine, 6 Cans of beer per week    Comment: pint of liquor a week  . Drug use: Yes    Types: Cocaine    Comment: 07/2015 last use      Allergies   Patient has no known allergies.   Review of Systems Review of Systems  Unable to perform ROS: Patient nonverbal  Constitutional: Positive for fever.  Skin: Positive for wound.  All other systems reviewed and are negative.    Physical Exam Updated Vital Signs BP (!) 148/81   Pulse (!) 111   Temp (!) 101.5 F (38.6 C)   Resp (!) 0   Ht _0  (1.626 m)   Wt 83.5 kg (184 lb)   SpO2 100%   BMI 31.58 kg/m   Physical Exam  Constitutional: She appears distressed.  Chronically ill appearing  HENT:  Head: Normocephalic and atraumatic.  Dry lips and mucous membranes, sputum build up in corners of mouth   Eyes: Conjunctivae are normal.  Maintains straight forward gaze, has blink reflex. Barely reactive round pupils symmetric bilaterally   Neck: Normal range of motion.  Trach in place with mucus in opening   Cardiovascular: Regular rhythm. Tachycardia present.  1+ DP and radial pulses bilaterally. No LE edema.   Pulmonary/Chest: She is in respiratory distress. She has rales.  S/p trach. Diffuse  rales and rhonchi in all lobes. Diminished lung sounds in LLL. RR 30s SpO2 > 94%  Abdominal: Soft. Bowel sounds are normal. There is tenderness.  Unreliable exam given vegetative state, question discomfort with deep palpation in lower lobes slight wincing noted. No guarding, distention.   Musculoskeletal: Normal range of motion.  Neurological:  Awake. Blink reflex present. Coughing with trach suction. No response with verbal, tactile or noxious stimuli. Does not follow commands Does not move extremities, no strength against gravity.   Skin: Skin is warm. Capillary refill takes less than 2 seconds. She is diaphoretic.  Fingers and toes warm   Nursing note and vitals reviewed.    ED Treatments / Results  Labs (all labs ordered are listed, but only abnormal results are displayed) Labs Reviewed  COMPREHENSIVE METABOLIC PANEL - Abnormal; Notable for the following components:      Result Value   Sodium 156 (*)    Chloride 119 (*)    Glucose, Bld 594 (*)    BUN 189 (*)    Creatinine, Ser 2.49 (*)    Calcium 8.8 (*)    Albumin 1.7 (*)    AST 398 (*)    ALT 157 (*)    GFR calc non Af Amer 20 (*)    GFR calc Af Amer 23 (*)    All other components within normal limits  CBC WITH DIFFERENTIAL/PLATELET - Abnormal; Notable for the following components:   WBC 27.9 (*)    RBC 2.86 (*)    Hemoglobin 8.7 (*)    HCT 30.0 (*)    MCV 104.9 (*)    MCHC 29.0 (*)    RDW 15.7 (*)    Neutro Abs 22.0 (*)    Lymphs Abs 4.2 (*)    Monocytes Absolute 1.7 (*)    All other components within normal limits  PROTIME-INR - Abnormal; Notable for the following components:   Prothrombin Time 18.4 (*)    All other components within normal limits  URINALYSIS, ROUTINE W REFLEX MICROSCOPIC - Abnormal; Notable for the following components:   Color, Urine AMBER (*)    APPearance CLOUDY (*)    pH 9.0 (*)    Glucose, UA 50 (*)    Protein, ur >=300 (*)    Leukocytes, UA LARGE (*)    Bacteria, UA RARE (*)    All  other components within normal limits  I-STAT TROPONIN, ED - Abnormal; Notable for the following components:   Troponin i, poc 0.10 (*)    All other components within normal limits  CULTURE, BLOOD (ROUTINE X 2)  CULTURE, BLOOD (ROUTINE X 2)  ETHANOL  ACETAMINOPHEN LEVEL  PROCALCITONIN  PROTIME-INR  APTT  HEMOGLOBIN W0J  BASIC METABOLIC PANEL  I-STAT CG4 LACTIC ACID, ED  I-STAT CG4 LACTIC ACID, ED  POC OCCULT BLOOD, ED  TYPE AND SCREEN    EKG EKG Interpretation  Date/Time:  Sunday December 09 2017 06:48:54 EDT Ventricular Rate:  127 PR Interval:    QRS Duration: 91 QT Interval:  327 QTC Calculation: 476 R Axis:   35 Text Interpretation:  Age not entered, assumed to be  62 years old for purpose of ECG interpretation Sinus tachycardia Borderline prolonged QT interval When compared with ECG of 10/08/2017, HEART RATE has increased QT has shortened Confirmed by Delora Fuel ([id number]) on 12/09/2017 7:03:12 AM   Radiology Dg Chest Portable 1 View  Result Date: 12/09/2017 CLINICAL DATA:  Sepsis EXAM: PORTABLE CHEST 1 VIEW COMPARISON:  Chest x-rays dated 11/08/2017 and 11/05/2017. FINDINGS: Heart size and mediastinal contours are stable. Tracheostomy tube appears appropriately positioned in the midline. There is chronic central pulmonary vascular congestion. Linear opacity  within the LEFT mid lung region is compatible with atelectasis. No new confluent opacity to suggest a developing pneumonia. No pleural effusion or pneumothorax seen. No acute or suspicious osseous finding. IMPRESSION: 1. No evidence of pneumonia or pulmonary edema. 2. Chronic mild central pulmonary vascular congestion. Electronically Signed   By: Franki Cabot M.D.   On: 12/09/2017 07:21    Procedures .Critical Care Performed by: Kinnie Feil, PA-C Authorized by: Kinnie Feil, PA-C   Critical care provider statement:    Critical care time (minutes):  35   Critical care time was exclusive of:  Teaching time    Critical care was necessary to treat or prevent imminent or life-threatening deterioration of the following conditions:  Sepsis and renal failure   Critical care was time spent personally by me on the following activities:  Discussions with consultants, evaluation of patient's response to treatment, examination of patient, review of old charts, re-evaluation of patient's condition, pulse oximetry, ordering and review of radiographic studies and ordering and review of laboratory studies   I assumed direction of critical care for this patient from another provider in my specialty: no     (including critical care time)  Medications Ordered in ED Medications  vancomycin (VANCOCIN) 1,500 mg in sodium chloride 0.9 % 500 mL IVPB (has no administration in time range)  vancomycin (VANCOCIN) IVPB 1000 mg/200 mL premix (has no administration in time range)  piperacillin-tazobactam (ZOSYN) IVPB 3.375 g (has no administration in time range)  albuterol (PROVENTIL) (2.5 MG/3ML) 0.083% nebulizer solution 2.5 mg (has no administration in time range)  feeding supplement (PRO-STAT SUGAR FREE 64) liquid 30 mL (has no administration in time range)  amLODipine (NORVASC) tablet 10 mg (has no administration in time range)  atorvastatin (LIPITOR) tablet 40 mg (has no administration in time range)  cloNIDine (CATAPRES) tablet 0.2 mg (has no administration in time range)  docusate (COLACE) 50 MG/5ML liquid 50 mg (has no administration in time range)  folic acid (FOLVITE) tablet 1 mg (has no administration in time range)  hydrALAZINE (APRESOLINE) tablet 100 mg (has no administration in time range)  insulin glargine (LANTUS) injection 40 Units (has no administration in time range)  labetalol (NORMODYNE) tablet 400 mg (has no administration in time range)  lacosamide (VIMPAT) tablet 200 mg (has no administration in time range)  levETIRAcetam (KEPPRA) 100 MG/ML solution 1,000 mg (has no administration in time range)    LORazepam (ATIVAN) injection 1 mg (has no administration in time range)  MULTIVITAMIN+ LIQD 15 mL (has no administration in time range)  nutrition supplement (JUVEN) (JUVEN) powder packet 1 packet (has no administration in time range)  pantoprazole sodium (PROTONIX) 40 mg/20 mL oral suspension 40 mg (has no administration in time range)  phenytoin (DILANTIN) 125 MG/5ML suspension 250 mg (has no administration in time range)  scopolamine (TRANSDERM-SCOP) 1 MG/3DAYS 1.5 mg (has no administration in time range)  thiamine tablet 100 mg (has no administration in time range)  topiramate (TOPAMAX) tablet 200 mg (has no administration in time range)  insulin aspart (novoLOG) injection 0-15 Units (has no administration in time range)  HYDROcodone-acetaminophen (NORCO/VICODIN) 5-325 MG per tablet 1-2 tablet (has no administration in time range)  senna-docusate (Senokot-S) tablet 1 tablet (has no administration in time range)  bisacodyl (DULCOLAX) suppository 10 mg (has no administration in time range)  ondansetron (ZOFRAN) tablet 4 mg (has no administration in time range)    Or  ondansetron (ZOFRAN) injection 4 mg (has no administration in time  range)  0.45 % sodium chloride infusion (has no administration in time range)  piperacillin-tazobactam (ZOSYN) IVPB 3.375 g (0 g Intravenous Stopped 12/09/17 0912)  sodium chloride 0.9 % bolus 2,000 mL (2,000 mLs Intravenous New Bag/Given 12/09/17 0814)     Initial Impression / Assessment and Plan / ED Course  I have reviewed the triage vital signs and the nursing notes.  Pertinent labs & imaging results that were available during my care of the patient were reviewed by me and considered in my medical decision making (see chart for details).  Clinical Course as of Dec 10 1003  Sun Dec 09, 2017  0704 Age not entered, assumed to be 62 years old for purpose of ECG interpretation Sinus tachycardia Borderline prolonged QT interval When compared with ECG of  10/08/2017, HEART RATE has increased QT has shortened  EKG 12-Lead [CG]  0726 IMPRESSION: 1. No evidence of pneumonia or pulmonary edema. 2. Chronic mild central pulmonary vascular congestion.    DG Chest Portable 1 View [CG]  0726 Troponin i, poc(!!): 0.10 [CG]  0736 WBC(!): 27.9 [CG]  0736 Hemoglobin(!): 8.7 [CG]  0816 Sodium(!): 156 [CG]  0816 Glucose(!!): 594 [CG]  0816 CO2: 23 [CG]  0816 AST(!): 398 [CG]  0816 ALT(!): 157 [CG]  0817 GFR, Est African American(!): 23 [CG]  0817 Anion gap: 14 [CG]  0840 Leukocytes, UA(!): LARGE [CG]  0841 Protein(!): >=300 [CG]  0841 WBC, UA: 11-20 [CG]    Clinical Course User Index [CG] Kinnie Feil, PA-C   SIRS criteria met with source respiratory vs UTI vs sacral ulcer. Temp 103.5 rectal, HR 125, RR 30s, SpO2 > 92 on oxygen. History and exam unreliable given chronic vegetative state, per nursing staff pt responds to verbal stimuli. EMS reported full code, no DNR paper work at bedside. No scanned documents available on epic other than comments regarding management for cardiac/respiratory arrest. Per last palliative care note pt is DNR. On arrival she is awake, maintains gaze forward and does not respond to verbal or noxious stimuli.  Labs, CXR, UA, pending. Broad spectrum abx and 2L IVF initiated.   0815: WBC 27.9, lactic acid WNL.  Hemoglobin 8.7 which appears to be baseline, Hemoccult pending.  Sodium 156, creatinine 2.5. AST 398, ALT 157, h/o ETOH abuse and hep C.  Troponin 0.1, EKG showing sinus tachycardia otherwise unchanged favor demand ischemia.  Will consult hospitalist for admission for sepsis with end organ damage likely from GU source, acute kidney injury, elevated LFTs.  Patient may need further imaging to evaluate transaminitis.   Sepsis - Repeat Assessment  Performed at:    0830  Vitals     Blood pressure 134/76, pulse (!) 114, temperature (!) 100.9 F (38.3 C), resp. rate 40, height _0  (1.626 m), weight 83.5 kg (184 lb),  SpO2 100 %.  Heart:     Tachycardic  Lungs:    Rhonchi  Capillary Refill:   <2 sec  Peripheral Pulse:   Radial pulse palpable and Dorsalis pedis pulse  palpable  Skin:     Diaphoretic  0840: hospitalist will admit, recommending palliative consult which is pending.   0940: palliative care consulted x 2 at [phone] and [phone]. Will attempt again.   1005: Spoke to palliative physician who will see pt and assist with ongoing goals of care and management.  Final Clinical Impressions(s) / ED Diagnoses   Final diagnoses:  SIRS (systemic inflammatory response syndrome) (HCC)  Severe sepsis with acute organ dysfunction ([place])  ED Discharge Orders    None         Arlean Hopping 30/14/84 0397    Delora Fuel, MD 95/36/92 [phone]

## 2017-12-09 NOTE — Progress Notes (Signed)
Patient received from the ED, VSS. Telemetry applied and CCMD notified. Trach supplies at bedside.Trach suctioned. RT notified of trach patient new to department.

## 2017-12-09 NOTE — Progress Notes (Signed)
On call MD notified about CBG of 421. Will await orders.

## 2017-12-09 NOTE — ED Notes (Signed)
Oral care performed and eye care.

## 2017-12-09 NOTE — ED Notes (Signed)
Md notified of pt increase in CBG after receiving her sliding scale insulin.

## 2017-12-09 NOTE — ED Notes (Signed)
Pt brief checked, no stool noted.

## 2017-12-09 NOTE — ED Notes (Signed)
Pt trach suctioned of scant amount of mucous using sterile technique.

## 2017-12-09 NOTE — H&P (Addendum)
History and Physical    [name] WNU:[id number] DOB: 02-Jan-1956 DOA: 12/09/2017   PCP: Nolene Ebbs, MD   Patient coming from:  [place]   Chief Complaint: High fever  HPI: [name] is a 62 y.o. female extensive medical history listed below, including persistent vegetative state, prior cocaine abuse, diabetes, hypertension, history of intracranial hemorrhage, renal insufficiency, history of stroke, history of RUE DVT, status post tracheostomy and PEG,, recent history of pneumonia and UTI, brought to the emergency department, as her nursing care facility noted that the patient had fever up to 103.  Of note, she was recently discovered to have a sacral decubitus ulcer, which was malodorous and draining.  According to the notes, the patient was minimally responsive to sound.  Other information cannot be obtained, due to level 5 caveat due to altered mental status and acuity.  Her notes from 12/06/2017, patient is DNR, and has been evaluated by palliative care.   ED Course:  BP 134/76   Pulse (!) 114   Temp (!) 100.9 F (38.3 C)   Resp 19   Ht '5\' 4"'$  (1.626 m)   Wt 83.5 kg (184 lb)   SpO2 100%   BMI 31.58 kg/m   Due to high fever, tachycardia, tachypnea, elevated white count, code sepsis was initiated.  Patient received 2 L of IV fluids, and also received vancomycin and Zosyn per pharmacy.  She also received Motrin prior to, with some improvement of her fever, up to 100.9 here.  vegetative state, not responsive to noxious stimuli. Sodium 156, potassium 4.6, chloride 119, bicarb 23. Glucose 594, anion gap normal at 14 Creatinine 2.49 baseline 0.99 Calcium 8.8 Alkaline phosphatase 57, albumin 1.7, AST 398, ALT 157, total protein 7.8, total bilirubin 0.5.  GFR 20 White count 27.9, was 13 on 11/16/2017 on her last admission Hemoglobin 8.7, platelets 291 PT to 18.4, INR 1.55 Urine with large leukocytes Culture pending Chest x-ray negative for pulmonary edema,  chronic mild central vascular congestion. Last 2D echo in April 2019 shows normal systolic, EF 55 to 03%, grade 2 diastolic.   Review of Systems:  As per HPI otherwise all other systems reviewed and are negative  Past Medical History:  Diagnosis Date  . Acute deep vein thrombosis (DVT) of right upper extremity ([place]) 08/30/2017  . Chronic diastolic CHF (congestive heart failure) ([place]) 08/30/2017  . Cocaine abuse, continuous ([place])   . Diabetes mellitus    Hb A1C = 12.6 on 05/15/11, managed on Novolog 70/30, 35 U qam, 25 U qpm  . Hypertension    poorly controlled  . Insomnia disorder   . Shortness of breath   . Stroke [facility])     Past Surgical History:  Procedure Laterality Date  . ANKLE FRACTURE SURGERY  2007  . IR GASTROSTOMY TUBE MOD SED  08/17/2017    Social History Social History   Socioeconomic History  . Marital status: Single    Spouse name: Not on file  . Number of children: Not on file  . Years of education: Not on file  . Highest education level: Not on file  Occupational History  . Not on file  Social Needs  . Financial resource strain: Not on file  . Food insecurity:    Worry: Not on file    Inability: Not on file  . Transportation needs:    Medical: Not on file    Non-medical: Not on file  Tobacco Use  . Smoking status: Current Every Day  Smoker    Packs/day: 1.50    Years: 25.00    Pack years: 37.50    Types: Cigarettes  . Smokeless tobacco: Never Used  Substance and Sexual Activity  . Alcohol use: Yes    Alcohol/week: 4.8 oz    Types: 2 Glasses of wine, 6 Cans of beer per week    Comment: pint of liquor a week  . Drug use: Yes    Types: Cocaine    Comment: 07/2015 last use   . Sexual activity: Yes    Birth control/protection: Post-menopausal  Lifestyle  . Physical activity:    Days per week: Not on file    Minutes per session: Not on file  . Stress: Not on file  Relationships  . Social connections:    Talks on phone: Not on file    Gets  together: Not on file    Attends religious service: Not on file    Active member of club or organization: Not on file    Attends meetings of clubs or organizations: Not on file    Relationship status: Not on file  . Intimate partner violence:    Fear of current or ex partner: Not on file    Emotionally abused: Not on file    Physically abused: Not on file    Forced sexual activity: Not on file  Other Topics Concern  . Not on file  Social History Narrative  . Not on file     No Known Allergies  Family History  Problem Relation Age of Onset  . Stroke Father   . Diabetes Sister   . Diabetes Brother       Prior to Admission medications   Medication Sig Start Date End Date Taking? Authorizing Provider  acetaminophen (TYLENOL) 160 MG/5ML solution Place 10.2 mLs (325 mg total) into feeding tube every 6 (six) hours as needed for mild pain, headache or fever. 09/13/17   Allie Bossier, MD  albuterol (PROVENTIL) (2.5 MG/3ML) 0.083% nebulizer solution Take 3 mLs (2.5 mg total) by nebulization every 2 (two) hours as needed for wheezing. 09/13/17   Allie Bossier, MD  Amino Acids-Protein Hydrolys (FEEDING SUPPLEMENT, PRO-STAT SUGAR FREE 64,) LIQD Place 30 mLs into feeding tube 3 (three) times daily. Patient taking differently: Place 30 mLs into feeding tube 2 (two) times daily.  09/13/17   Allie Bossier, MD  amLODipine (NORVASC) 10 MG tablet Place 1 tablet (10 mg total) into feeding tube daily. 09/14/17   Allie Bossier, MD  atorvastatin (LIPITOR) 40 MG tablet Place 1 tablet (40 mg total) into feeding tube daily at 6 PM. 09/13/17   Allie Bossier, MD  cloNIDine (CATAPRES) 0.2 MG tablet Place 1 tablet (0.2 mg total) into feeding tube 3 (three) times daily. 09/13/17   Allie Bossier, MD  docusate (COLACE) 50 MG/5ML liquid Place 5 mLs (50 mg total) into feeding tube 2 (two) times daily as needed for mild constipation. 09/13/17   Allie Bossier, MD  folic acid (FOLVITE) 1 MG tablet Take 1 tablet (1 mg  total) by mouth daily. 06/05/17   Aline August, MD  hydrALAZINE (APRESOLINE) 100 MG tablet Place 1 tablet (100 mg total) into feeding tube every 8 (eight) hours. 09/13/17   Allie Bossier, MD  insulin glargine (LANTUS) 100 UNIT/ML injection Inject 0.4 mLs (40 Units total) into the skin 2 (two) times daily. 10/18/17   Ghimire, Henreitta Leber, MD  insulin lispro (HUMALOG) 100 UNIT/ML injection Inject 2-10  Units into the skin 4 (four) times daily. Per sliding scale. 201-250=2 units, 251-300=4 units, 301-350=6 units, 351-400=8 units, 401-450=10 units > 450 Call MD.    [provider]  labetalol (NORMODYNE) 200 MG tablet Place 2 tablets (400 mg total) into feeding tube 3 (three) times daily. 09/13/17   Allie Bossier, MD  lacosamide (VIMPAT) 200 MG TABS tablet Place 1 tablet (200 mg total) into feeding tube 2 (two) times daily. 09/13/17   Allie Bossier, MD  levETIRAcetam (KEPPRA) 100 MG/ML solution Place 10 mLs (1,000 mg total) into feeding tube 2 (two) times daily. 09/13/17   Allie Bossier, MD  LORazepam (ATIVAN) 2 MG/ML injection Inject 0.5 mLs (1 mg total) into the vein as needed for seizure (Give if seizure lasts longer than 5 minutes). 10/18/17   Ghimire, Henreitta Leber, MD  Multiple Vitamin (MULTIVITAMIN+) LIQD Take 15 mLs by mouth daily.    [provider]  nutrition supplement, JUVEN, (JUVEN) PACK Place 1 packet into feeding tube 2 (two) times daily between meals.    [provider]  pantoprazole sodium (PROTONIX) 40 mg/20 mL PACK Place 20 mLs (40 mg total) into feeding tube at bedtime. 09/13/17   Allie Bossier, MD  phenytoin (DILANTIN) 125 MG/5ML suspension Place 250 mg into feeding tube 2 (two) times daily.    [provider]  scopolamine (TRANSDERM-SCOP) 1 MG/3DAYS Place 1 patch (1.5 mg total) onto the skin every 3 (three) days. 09/14/17   Allie Bossier, MD  thiamine 100 MG tablet Place 1 tablet (100 mg total) into feeding tube daily. 09/14/17   Allie Bossier, MD    topiramate (TOPAMAX) 200 MG tablet Place 1 tablet (200 mg total) into feeding tube 2 (two) times daily. 09/13/17   Allie Bossier, MD    Physical Exam:  Vitals:   12/09/17 6578 12/09/17 0654 12/09/17 0703 12/09/17 0800  BP: (!) 144/75   134/76  Pulse: (!) 125   (!) 114  Resp:    19  Temp:   (!) 103.5 F (39.7 C) (!) 100.9 F (38.3 C)  TempSrc:   Rectal   SpO2: 93%  100% 100%  Weight:  83.5 kg (184 lb)    Height:  '5\' 4"'$  (1.626 m)     Constitutional: Chronically ill appearing, eyes open, the patient is in a vegetative state. Eyes: PERRL, lids and conjunctivae normal ENMT: Mucous membranes are dry, without exudate or lesions  Neck: normal, supple, trach in place, there is some mucus in opening. Respiratory: Diffuse rhonchi throughout, anteriorly, there are diminished lung sounds on the left.  Normal respiratory effort  Cardiovascular: Tachycardic rate and rhythm,  murmur, rubs or gallops. No extremity edema. 2+ pedal pulses. No carotid bruits.  Abdomen: Soft, no apparent tenderness.  There is a G-tube in place., No hepatosplenomegaly. Bowel sounds positive.  Musculoskeletal: no clubbing / cyanosis.  Unable to move her extremities. Skin: no jaundice, large sacral odorous wound, no blood noted.  Neurologic: Sensation appears decreased, she is non-responsive to painful stimuli.  Not responsive to verbal stimuli.  Does not move her extremities.  Eyes remain open at the straightforward gaze.  Minimal movement of her eyes.  Strength unable to be assessed as the patient cannot participate.     Labs on Admission: I have personally reviewed following labs and imaging studies  CBC: Recent Labs  Lab 12/09/17 0658  WBC 27.9*  NEUTROABS 22.0*  HGB 8.7*  HCT 30.0*  MCV 104.9*  PLT 291  Basic Metabolic Panel: Recent Labs  Lab 12/09/17 0658  NA 156*  K 4.6  CL 119*  CO2 23  GLUCOSE 594*  BUN 189*  CREATININE 2.49*  CALCIUM 8.8*    GFR: Estimated Creatinine Clearance: 24.8  mL/min (A) (by C-G formula based on SCr of 2.49 mg/dL (H)).  Liver Function Tests: Recent Labs  Lab 12/09/17 0658  AST 398*  ALT 157*  ALKPHOS 57  BILITOT 0.5  PROT 7.8  ALBUMIN 1.7*   No results for input(s): LIPASE, AMYLASE in the last 168 hours. No results for input(s): AMMONIA in the last 168 hours.  Coagulation Profile: Recent Labs  Lab 12/09/17 0658  INR 1.55    Cardiac Enzymes: No results for input(s): CKTOTAL, CKMB, CKMBINDEX, TROPONINI in the last 168 hours.  BNP (last 3 results) No results for input(s): PROBNP in the last 8760 hours.  HbA1C: No results for input(s): HGBA1C in the last 72 hours.  CBG: No results for input(s): GLUCAP in the last 168 hours.  Lipid Profile: No results for input(s): CHOL, HDL, LDLCALC, TRIG, CHOLHDL, LDLDIRECT in the last 72 hours.  Thyroid Function Tests: No results for input(s): TSH, T4TOTAL, FREET4, T3FREE, THYROIDAB in the last 72 hours.  Anemia Panel: No results for input(s): VITAMINB12, FOLATE, FERRITIN, TIBC, IRON, RETICCTPCT in the last 72 hours.  Urine analysis:    Component Value Date/Time   COLORURINE AMBER (A) 12/09/2017 0758   APPEARANCEUR CLOUDY (A) 12/09/2017 0758   LABSPEC 1.016 12/09/2017 0758   PHURINE 9.0 (H) 12/09/2017 0758   GLUCOSEU 50 (A) 12/09/2017 0758   GLUCOSEU > 1000 mg/dL (A) 11/01/2007 2056   HGBUR NEGATIVE 12/09/2017 0758   HGBUR negative 07/15/2007 0954   BILIRUBINUR NEGATIVE 12/09/2017 0758   KETONESUR NEGATIVE 12/09/2017 0758   PROTEINUR >=300 (A) 12/09/2017 0758   UROBILINOGEN 1.0 04/09/2012 1524   NITRITE NEGATIVE 12/09/2017 0758   LEUKOCYTESUR LARGE (A) 12/09/2017 0758    Sepsis Labs: '@LABRCNTIP'$ (procalcitonin:4,lacticidven:4) )No results found for this or any previous visit (from the past 240 hour(s)).   Radiological Exams on Admission: Dg Chest Portable 1 View  Result Date: 12/09/2017 CLINICAL DATA:  Sepsis EXAM: PORTABLE CHEST 1 VIEW COMPARISON:  Chest x-rays dated  11/08/2017 and 11/05/2017. FINDINGS: Heart size and mediastinal contours are stable. Tracheostomy tube appears appropriately positioned in the midline. There is chronic central pulmonary vascular congestion. Linear opacity within the LEFT mid lung region is compatible with atelectasis. No new confluent opacity to suggest a developing pneumonia. No pleural effusion or pneumothorax seen. No acute or suspicious osseous finding. IMPRESSION: 1. No evidence of pneumonia or pulmonary edema. 2. Chronic mild central pulmonary vascular congestion. Electronically Signed   By: Franki Cabot M.D.   On: 12/09/2017 07:21    EKG: Independently reviewed. Sinus tachycardia Borderline prolonged QT interval When compared with ECG of 10/08/2017, HEART RATE has increased QT has shortened  Assessment/Plan Principal Problem:   Sepsis ([place]) Active Problems:   Methicillin resistant Staphylococcus aureus infection   Type II diabetes mellitus with renal manifestations (H[facility])   Tracheostomy status (H[facility])   Chronic diastolic CHF (congestive heart failure) ([facility])   Essential hypertension   Diabetes mellitus type 2, uncontrolled, with complications ([facility])   Persistent vegetative state ([place])   Cognitive dysfunction due to old stroke   Palliative care encounter   Sacral ulcer ([place])    Presumed sepsis likely due to sacral wound, versus UTI.  On presentation, she had fever up to 103, tachycardia, she was tachypneic, lactate was  normal.  White count 28.  QSOFA score 2   Antibiotics delivered in the ED with vancomycin and Zosyn, and 2 L of IV fluids were given at the ER.  UA positive for large  leukocytes.  Initial lactic acid was 1.55.  Chest x-ray negative for infiltrates.  EKG with sinus tachycardia, slightly more prolonged QTC than prior. Admit to SDU Sepsis order set   IV antibiotics by pharmacy with  in am as patient received Vanc and Zosyn in ED  Follow the next lactic acid resolved, if continues to increase, will proceed with  serial LA.   Follow blood and urine cultures IV fluids at 100 cc/h half-normal saline Procalcitonin order set  Wound care for sacral ulcer CBC in am      Acute on Chronic CKD: likely due to dehydration creatinine 2.49, normal baseline between 1.2-1.4 Lab Results  Component Value Date   CREATININE 2.49 (H) 12/09/2017   CREATININE 0.99 11/16/2017   CREATININE 0.99 11/15/2017  IVF  Follow BMP daily    Seizure disorder, no recent seizures while at the ED. Continue Vimpat and Keppra, Dilantin, and Ativan if seizure lasting longer than 5 minutes Continue Topamax  History of vegetative state, and status epilepticus, bedbound-chronic indwelling Foley catheter,  trach and PEG tube placement.  Prior history of CVA and thalamic hemorrhage in 2018, worsening encephalopathy, and recent history of status epilepticus, very poor prognosis overall. Monitor for any increased secretions in tube PEG tube care and trach care per nursing Scopolamine for increased secretions Awaiting palliative care evaluation  History of multiple pressure ulcers prior to admission to the hospital, now with a large, orders sacral wound. Consult wound care Continue Santyl for now    Type II Diabetes Current blood sugar level is very elevated at 194, normal anion gap at 14, in the setting of not having received her regular insulin at the nursing facility today.  No ketones in urine. Lab Results  Component Value Date   HGBA1C 8.3 (H) 10/08/2017  Resume Lantus , SSI     Hypertension BP 134/76   Pulse   114   Controlled  Continue home anti-hypertensive medications by tube  Hyperlipidemia Continue home statins by tube  GERD, no acute symptoms Continue PPI by tube  Hypernatremia, the patient sodium was 155 on admission, chloride 119.  This is likely to dehydration.  The patient has been receiving 2 L of IV fluids, Will switch to half-normal saline at 100 cc an hour for now. Recheck be met in 6 hours, to  carefully monitor for her electrolytes.  Abnormal LFTs in the setting of severe dehydration.Alkaline phosphatase 57, albumin 1.7, AST 398, ALT 157, total protein 7.8, total bilirubin 0.5 Repeat LFTs in am IVF    Anemia of chronic disease Hemoglobin on admission 8.7 at baseline, Hcult pending   recent history of acute blood loss anemia on 11/05/2017, requiring transfusion.  CT of the abdomen at the time, showed no evidence of acute retroperitoneal hematoma.  At the time, GI consultation was obtained, recommending avoiding anticoagulants if possible, transfusing as needed, supportive care.   Repeat CBC in am  No transfusion is indicated at this time  Goals of care during her last admission, palliative care evaluated the patient, and goals of care had been at the time discussed with her daughter over the phone.  According to the notes, the patient's daughter at the time was very apprehensive regarding making decisions for her mother.  Per chart, this has been an  ongoing issue for several months, no family has been seen the patient.  She has been hospitalized already 6 times in the last 6 months.  Patient is not responsive to pain stimuli.  Palliative care notes, the patient is DNR. Clementeen Graham, MD, attending physician, has tried to reach her daughter once again today, and has left a message on her phone for further discussion.  At this time, patient's daughter has not returned a phone call.  Will await her response, for further plans regarding her mother's prognosis and goals of care.  DVT prophylaxis: SCDs due to history of bleeding Code Status:    DNR Family Communication: None Disposition Plan: Expect patient to be discharged to nursing home after condition improves Consults called:    Palliative care per EDP Admission status:    Sharene Butters, PA-C [facility]   Amion text  [phone]   12/09/2017, 9:19 AM

## 2017-12-09 NOTE — ED Notes (Signed)
Pt bottom checked for stool no stool noted. Mepilex dressing applied to buttock/sacral wound stage 3/4 with small area of tunneling noted. Pt also has wound to upper posterior thigh.

## 2017-12-09 NOTE — Progress Notes (Signed)
RT Note: Patient arrived to unit 4E from the ED. She continues on her ATC with no complications. Rt will continue to monitor and assist as needed.

## 2017-12-09 NOTE — ED Notes (Signed)
Report given to [address] RN

## 2017-12-09 NOTE — Care Management (Signed)
Called daughter Royston Bake at [phone]. No answer. [place]

## 2017-12-09 NOTE — ED Notes (Signed)
Unable to obtain 2nd set of blood cultures- antibiotics started

## 2017-12-10 DIAGNOSIS — I10 Essential (primary) hypertension: Secondary | ICD-10-CM

## 2017-12-10 DIAGNOSIS — L98423 Non-pressure chronic ulcer of back with necrosis of muscle: Secondary | ICD-10-CM

## 2017-12-10 DIAGNOSIS — Z930 Tracheostomy status: Secondary | ICD-10-CM

## 2017-12-10 DIAGNOSIS — E1122 Type 2 diabetes mellitus with diabetic chronic kidney disease: Secondary | ICD-10-CM

## 2017-12-10 DIAGNOSIS — Z794 Long term (current) use of insulin: Secondary | ICD-10-CM

## 2017-12-10 DIAGNOSIS — N183 Chronic kidney disease, stage 3 (moderate): Secondary | ICD-10-CM

## 2017-12-10 DIAGNOSIS — R403 Persistent vegetative state: Secondary | ICD-10-CM

## 2017-12-10 LAB — COMPREHENSIVE METABOLIC PANEL
ALBUMIN: 1.3 g/dL — AB (ref 3.5–5.0)
ALK PHOS: 44 U/L (ref 38–126)
ALT: 98 U/L — ABNORMAL HIGH (ref 14–54)
AST: 163 U/L — AB (ref 15–41)
Anion gap: 10 (ref 5–15)
BILIRUBIN TOTAL: 0.6 mg/dL (ref 0.3–1.2)
BUN: 160 mg/dL — ABNORMAL HIGH (ref 6–20)
CALCIUM: 7.1 mg/dL — AB (ref 8.9–10.3)
CO2: 18 mmol/L — AB (ref 22–32)
Chloride: 115 mmol/L — ABNORMAL HIGH (ref 101–111)
Creatinine, Ser: 2.03 mg/dL — ABNORMAL HIGH (ref 0.44–1.00)
GFR calc Af Amer: 29 mL/min — ABNORMAL LOW (ref >60)
GFR calc non Af Amer: 25 mL/min — ABNORMAL LOW (ref >60)
GLUCOSE: 237 mg/dL — AB (ref 65–99)
Potassium: 3 mmol/L — ABNORMAL LOW (ref 3.5–5.1)
SODIUM: 143 mmol/L (ref 135–145)
TOTAL PROTEIN: 6 g/dL — AB (ref 6.5–8.1)

## 2017-12-10 LAB — CBC
HEMATOCRIT: 22.8 % — AB (ref 36.0–46.0)
HEMOGLOBIN: 6.6 g/dL — AB (ref 12.0–15.0)
MCH: 30.7 pg (ref 26.0–34.0)
MCHC: 28.9 g/dL — AB (ref 30.0–36.0)
MCV: 106 fL — ABNORMAL HIGH (ref 78.0–100.0)
Platelets: 208 10*3/uL (ref 150–400)
RBC: 2.15 MIL/uL — AB (ref 3.87–5.11)
RDW: 15.7 % — ABNORMAL HIGH (ref 11.5–15.5)
WBC: 22.4 10*3/uL — AB (ref 4.0–10.5)

## 2017-12-10 LAB — BLOOD CULTURE ID PANEL (REFLEXED)
ACINETOBACTER BAUMANNII: NOT DETECTED
CANDIDA ALBICANS: NOT DETECTED
CANDIDA KRUSEI: NOT DETECTED
CANDIDA PARAPSILOSIS: NOT DETECTED
CANDIDA TROPICALIS: NOT DETECTED
CARBAPENEM RESISTANCE: NOT DETECTED
Candida glabrata: NOT DETECTED
ENTEROBACTER CLOACAE COMPLEX: NOT DETECTED
ENTEROBACTERIACEAE SPECIES: DETECTED — AB
Enterococcus species: NOT DETECTED
Escherichia coli: NOT DETECTED
HAEMOPHILUS INFLUENZAE: NOT DETECTED
KLEBSIELLA OXYTOCA: NOT DETECTED
KLEBSIELLA PNEUMONIAE: NOT DETECTED
Listeria monocytogenes: NOT DETECTED
Neisseria meningitidis: NOT DETECTED
PROTEUS SPECIES: DETECTED — AB
PSEUDOMONAS AERUGINOSA: NOT DETECTED
STREPTOCOCCUS PYOGENES: NOT DETECTED
STREPTOCOCCUS SPECIES: NOT DETECTED
Serratia marcescens: NOT DETECTED
Staphylococcus aureus (BCID): NOT DETECTED
Staphylococcus species: NOT DETECTED
Streptococcus agalactiae: NOT DETECTED
Streptococcus pneumoniae: NOT DETECTED

## 2017-12-10 LAB — HEMOGLOBIN A1C
Hgb A1c MFr Bld: 8.2 % — ABNORMAL HIGH (ref 4.8–5.6)
MEAN PLASMA GLUCOSE: 189 mg/dL

## 2017-12-10 LAB — GLUCOSE, CAPILLARY
Glucose-Capillary: 230 mg/dL — ABNORMAL HIGH (ref 65–99)
Glucose-Capillary: 231 mg/dL — ABNORMAL HIGH (ref 65–99)
Glucose-Capillary: 337 mg/dL — ABNORMAL HIGH (ref 65–99)
Glucose-Capillary: 277 mg/dL — ABNORMAL HIGH (ref 65–99)

## 2017-12-10 LAB — PREPARE RBC (CROSSMATCH)

## 2017-12-10 LAB — PATHOLOGIST SMEAR REVIEW

## 2017-12-10 LAB — HEMOGLOBIN AND HEMATOCRIT, BLOOD
HEMATOCRIT: 32.4 % — AB (ref 36.0–46.0)
HEMOGLOBIN: 9.9 g/dL — AB (ref 12.0–15.0)

## 2017-12-10 MED ORDER — INSULIN ASPART 100 UNIT/ML ~~LOC~~ SOLN
0.0000 [IU] | SUBCUTANEOUS | Status: DC
  Administered 2017-12-10: 11 [IU] via SUBCUTANEOUS
  Administered 2017-12-11: 7 [IU] via SUBCUTANEOUS
  Administered 2017-12-11 (×2): 4 [IU] via SUBCUTANEOUS
  Administered 2017-12-11: 15 [IU] via SUBCUTANEOUS
  Administered 2017-12-12 – 2017-12-13 (×6): 4 [IU] via SUBCUTANEOUS
  Administered 2017-12-13: 3 [IU] via SUBCUTANEOUS
  Administered 2017-12-13: 7 [IU] via SUBCUTANEOUS
  Administered 2017-12-13: 3 [IU] via SUBCUTANEOUS
  Administered 2017-12-13: 7 [IU] via SUBCUTANEOUS
  Administered 2017-12-14 (×6): 4 [IU] via SUBCUTANEOUS
  Administered 2017-12-15: 7 [IU] via SUBCUTANEOUS
  Administered 2017-12-15: 4 [IU] via SUBCUTANEOUS
  Administered 2017-12-15 (×3): 7 [IU] via SUBCUTANEOUS
  Administered 2017-12-16 (×2): 4 [IU] via SUBCUTANEOUS
  Administered 2017-12-16: 7 [IU] via SUBCUTANEOUS
  Administered 2017-12-16: 4 [IU] via SUBCUTANEOUS

## 2017-12-10 MED ORDER — SODIUM CHLORIDE 0.9 % IV SOLN
2.0000 g | INTRAVENOUS | Status: DC
  Administered 2017-12-10 – 2017-12-13 (×4): 2 g via INTRAVENOUS
  Filled 2017-12-10 (×4): qty 20

## 2017-12-10 MED ORDER — SODIUM CHLORIDE 0.9 % IV SOLN: Freq: Once | INTRAVENOUS | Status: DC

## 2017-12-10 MED ORDER — INSULIN GLARGINE 100 UNIT/ML ~~LOC~~ SOLN
43.0000 [IU] | Freq: Two times a day (BID) | SUBCUTANEOUS | Status: DC
  Administered 2017-12-10 – 2017-12-13 (×5): 43 [IU] via SUBCUTANEOUS
  Filled 2017-12-10 (×6): qty 0.43

## 2017-12-10 NOTE — Progress Notes (Signed)
PROGRESS NOTE  [name] YCX:[id number] DOB: Sep 07, 1955 DOA: 12/09/2017 PCP: [name] Ebbs, MD  HPI  [name] is a 62 y.o. 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    Of note patient was last seen in the hospital on 4/29-5/10 for anemia requiring blood transfusion after previous episode of acute blood loss anemia/hemorrhagic shock related to abdominal wall hematoma.    Subjective Not able to communicate.   Assessment/Plan: Principal Problem:   Sepsis ([place]) Active Problems:   Methicillin resistant Staphylococcus aureus infection   Type II diabetes mellitus with renal manifestations (HCC)   Tracheostomy status (HCC)   Chronic diastolic CHF (congestive heart failure) (HCC)   Essential hypertension   Diabetes mellitus type 2, uncontrolled, with complications (HCC)   Persistent vegetative state ([place])   Cognitive dysfunction due to old stroke   Palliative care encounter   Sacral ulcer ([place])   1. Sepsis with proteus/enterobacter bacteremia, presumed secondary to sacral decubitus ulcer (present prior to admission). Still febrile but fever curve improving with trend of 100.6, significant leukocytosis in 20s and ulcer at least stage 3 with malodorous drainage. Vancomycin and Zosyn  switched to Ceftriaxone due to + blood cultures.  Wound care consulted, I suspect will likely probe to bone. CXR with no signs of infection. UA with large leukocytes but rare bacteria.   2. Acute on chronic anemia. No current signs of bleeding. S/p 2 U PRBC today for hgb of 6.6 and will follow cbc afterwards. Low threshold for abdominal imaging since has had hemorrhage before but currently not on any anticoagulation to increase risk.  3. Acute on chronic CKD. Baseline creatinine 1. Peak of 2.3 here already improving with fluids, currently 2. Daily BMP. Avoid nephrotoxins  4. Elevated LFTs, improving, Likely element of shock liver in setting of sepsis and profound dehydration. Tylenol < 10.  Already improving significantly with fluids and antibiotics  5. Acute hypernatremia, resolved. Peak Na of 156 improved to 143 currently with 1/2 NS fluids. D/C IVF and monitor. Daily BMP.   6. Chronic respiratory failure s/p trach. RT to follow for trach site maintenance. CXR no PNA or edema. Chronic vascular congestion on CXR. Scopolamine for secretions  7. Type 2 Diabetes, (A1c 8.2)poorly controlled but improving. Glucose > 500 on admission with no anion gap. Appreciate Diabetes Recs: increase Lantus to 43 U BID, Novolog correction, resistant scale q4H  8. Hypertension, at goal. No hypotension or lactic acidosis with sepsis. Continue amlodipine, clonidine, labetalol, and hydralazine  9. Status Epilepticus, persistently vegetated state. Per previous hospitalization notes this is her baseline. Continue home topamax, phenytoin, and keppra, and lacosamide. PRN ativan  10. Hyperlipidemia. Hold home statin in setting of elevated LFTs.   11. GERD. Protonix  Code Status: DNR   Family Communication: Unable to reach daughter [name] despite several attempts from multiple members of medical staff  Disposition Plan:  Continue narrowed antibiotics, monitor culture data, f/u hemoglogin. We will continue to try  to reach daughter. I have also asked S/W to discuss with patient's facility in hopes of trying another contact number or other individual. If we still are unable to reach her I agree with [name] (Palliative) that given her baseline poor quality of life with this infection and poor prognosis clinically that comfort care would be the best most compassionate route.     Consultants:  Palliative  Procedures:  Blood transfusion : 2U, 6/3   Antimicrobials:  Vancomycin  Zosyn  Ceftriaxone   Cultures:  Blood, 12/09/17   Telemetry: yes  DVT prophylaxis: SCDs   Objective: Vitals:   12/10/17 0400 12/10/17 0459 12/10/17 0817 12/10/17 0821  BP: (!) 119/59  125/63 125/63  Pulse:   100 100  Resp: 16   (!) 32  Temp: (!) 100.6 F (38.1 C)     TempSrc: Axillary     SpO2: 98%  98% 98%  Weight:  77.9 kg (171 lb 11.8 oz)    Height:        Intake/Output Summary (Last 24 hours) at 12/10/2017 0865 Last data filed at 12/10/2017 0500 Gross per 24 hour  Intake 4496.67 ml  Output 1400 ml  Net 3096.67 ml   Filed Weights   12/09/17 0654 12/10/17 0459  Weight: 83.5 kg (184 lb) 77.9 kg (171 lb 11.8 oz)    Exam:  Constitutional:chronically ill appearing female Eyes: flutter open intermittently  ENMT: Oropharynx with dry mucous membranes, Cardiovascular: RRR no MRGs, with no peripheral edema Respiratory: Normal respiratory effort with increased gurgling at trach site Abdomen: Soft,non-tender, PEG tube in place with surrounding crusting but no drainage or odor Skin: No rash ulcers, or lesions. Without skin tenting  Neurologic: Can not communicate. Unresponsive to voice. Briefly opens eyes but not in response to voice or touch.     Data Reviewed: CBC: Recent Labs  Lab 12/09/17 0658 12/10/17 0304  WBC 27.9* 22.4*  NEUTROABS 22.0*  --   HGB 8.7* 6.6*  HCT 30.0* 22.8*  MCV 104.9* 106.0*  PLT 291 784   Basic Metabolic Panel: Recent Labs  Lab 12/09/17 0658 12/09/17 1858  12/10/17 0304  NA 156* 150* 143  K 4.6 3.8 3.0*  CL 119* 116* 115*  CO2 23 20* 18*  GLUCOSE 594* 524* 237*  BUN 189* 185* 160*  CREATININE 2.49* 2.36* 2.03*  CALCIUM 8.8* 8.1* 7.1*   GFR: Estimated Creatinine Clearance: 29.4 mL/min (A) (by C-G formula based on SCr of 2.03 mg/dL (H)). Liver Function Tests: Recent Labs  Lab 12/09/17 0658 12/10/17 0304  AST 398* 163*  ALT 157* 98*  ALKPHOS 57 44  BILITOT 0.5 0.6  PROT 7.8 6.0*  ALBUMIN 1.7* 1.3*   No results for input(s): LIPASE, AMYLASE in the last 168 hours. No results for input(s): AMMONIA in the last 168 hours. Coagulation Profile: Recent Labs  Lab 12/09/17 0658 12/09/17 1650  INR 1.55 1.65   Cardiac Enzymes: No results for input(s): CKTOTAL, CKMB, CKMBINDEX, TROPONINI in the last 168 hours. BNP (last 3 results) No results for input(s): PROBNP in the last 8760 hours. HbA1C: No results for input(s): HGBA1C in the last 72 hours. CBG: Recent Labs  Lab 12/09/17 1403 12/09/17 1619 12/09/17 1828 12/09/17 2135 12/10/17 0613  GLUCAP 538* 497* 421* 350* 230*   Lipid Profile: No results for input(s): CHOL, HDL, LDLCALC, TRIG, CHOLHDL, LDLDIRECT in the last 72 hours. Thyroid Function Tests: No results for input(s):  TSH, T4TOTAL, FREET4, T3FREE, THYROIDAB in the last 72 hours. Anemia Panel: No results for input(s): VITAMINB12, FOLATE, FERRITIN, TIBC, IRON, RETICCTPCT in the last 72 hours. Urine analysis:    Component Value Date/Time   COLORURINE AMBER (A) 12/09/2017 0758   APPEARANCEUR CLOUDY (A) 12/09/2017 0758   LABSPEC 1.016 12/09/2017 0758   PHURINE 9.0 (H) 12/09/2017 0758   GLUCOSEU 50 (A) 12/09/2017 0758   GLUCOSEU > 1000 mg/dL (A) 11/01/2007 2056   HGBUR NEGATIVE 12/09/2017 0758   HGBUR negative 07/15/2007 0954   BILIRUBINUR NEGATIVE 12/09/2017 0758   KETONESUR NEGATIVE 12/09/2017 0758   PROTEINUR >=300 (A) 12/09/2017 0758   UROBILINOGEN 1.0 04/09/2012 1524   NITRITE NEGATIVE 12/09/2017 0758    LEUKOCYTESUR LARGE (A) 12/09/2017 0758   Sepsis Labs: @LABRCNTIP (procalcitonin:4,lacticidven:4)  ) Recent Results (from the past 240 hour(s))  Culture, blood (Routine x 2)     Status: None (Preliminary result)   Collection Time: 12/09/17  7:00 AM  Result Value Ref Range Status   Specimen Description BLOOD RIGHT HAND  Final   Special Requests   Final    BOTTLES DRAWN AEROBIC AND ANAEROBIC Blood Culture adequate volume   Culture  Setup Time   Final    GRAM NEGATIVE RODS AEROBIC BOTTLE ONLY Organism ID to follow Performed at [facility], [place] [address] [place], [place] [address]    Culture GRAM NEGATIVE RODS  Final   Report Status PENDING  Incomplete      Studies: No results found.  Scheduled Meds: . amLODipine  10 mg Per Tube Daily  . atorvastatin  40 mg Per Tube q1800  . cloNIDine  0.2 mg Per Tube TID  . feeding supplement (PRO-STAT SUGAR FREE 64)  30 mL Per Tube TID  . folic acid  1 mg Oral Daily  . hydrALAZINE  100 mg Per Tube Q8H  . insulin aspart  0-15 Units Subcutaneous TID WC  . insulin glargine  40 Units Subcutaneous BID  . labetalol  400 mg Per Tube TID  . lacosamide  200 mg Per Tube BID  . levETIRAcetam  1,000 mg Per Tube BID  . multivitamin  15 mL Oral Daily  . nutrition supplement (JUVEN)  1 packet Per Tube BID BM  . pantoprazole sodium  40 mg Per Tube QHS  . phenytoin  250 mg Per Tube BID  . scopolamine  1 patch Transdermal Q72H  . thiamine  100 mg Per Tube Daily  . topiramate  200 mg Per Tube BID    Continuous Infusions: . sodium chloride 100 mL/hr at 12/09/17 1839  . sodium chloride    . piperacillin-tazobactam (ZOSYN)  IV 3.375 g (12/10/17 0653)  . vancomycin       LOS: 1 day     Desiree Hane, MD [facility] Pager [phone]  If 7PM-7AM, please contact night-coverage [email or web address] Password [facility] 12/10/2017, 9:23 AM

## 2017-12-10 NOTE — Progress Notes (Signed)
PHARMACY - PHYSICIAN COMMUNICATION CRITICAL VALUE ALERT - BLOOD CULTURE IDENTIFICATION (BCID)  [name] is an 62 y.o. female who presented to [facility] on 12/09/2017 with a chief complaint of fevers  Assessment:  47 YOF with a persistent vegetation state - s/p trach/PEG with fevers on admit and concern for sepsis due to infected sacral wound vs UTI. Blood cultures are showing 1/2 GNR with BCID reflecting Proteus.   Name of physician (or Provider) Contacted: Lisbeth Ply  Current antibiotics: Vancomycin + Zosyn  Changes to prescribed antibiotics recommended:  D/c Vancomycin and Zosyn and transition to Rocephin 2g IV every 24 hours  Results for orders placed or performed during the hospital encounter of 12/09/17  Blood Culture ID Panel (Reflexed) (Collected: 12/09/2017  7:00 AM)  Result Value Ref Range   Enterococcus species NOT DETECTED NOT DETECTED   Listeria monocytogenes NOT DETECTED NOT DETECTED   Staphylococcus species NOT DETECTED NOT DETECTED   Staphylococcus aureus NOT DETECTED NOT DETECTED   Streptococcus species NOT DETECTED NOT DETECTED   Streptococcus agalactiae NOT DETECTED NOT DETECTED   Streptococcus pneumoniae NOT DETECTED NOT DETECTED   Streptococcus pyogenes NOT DETECTED NOT DETECTED   Acinetobacter baumannii NOT DETECTED NOT DETECTED   Enterobacteriaceae species DETECTED (A) NOT DETECTED   Enterobacter cloacae complex NOT DETECTED NOT DETECTED   Escherichia coli NOT DETECTED NOT DETECTED   Klebsiella oxytoca NOT DETECTED NOT DETECTED   Klebsiella pneumoniae NOT DETECTED NOT DETECTED   Proteus species DETECTED (A) NOT DETECTED   Serratia marcescens NOT DETECTED NOT DETECTED   Carbapenem resistance NOT DETECTED NOT DETECTED   Haemophilus influenzae NOT DETECTED NOT DETECTED   Neisseria meningitidis NOT DETECTED NOT DETECTED   Pseudomonas aeruginosa NOT DETECTED NOT DETECTED   Candida albicans NOT DETECTED NOT DETECTED   Candida glabrata NOT DETECTED NOT  DETECTED   Candida krusei NOT DETECTED NOT DETECTED   Candida parapsilosis NOT DETECTED NOT DETECTED   Candida tropicalis NOT DETECTED NOT DETECTED    Lawson Radar 12/10/2017  9:39 AM

## 2017-12-10 NOTE — Progress Notes (Addendum)
62 yo woman in PVS since January 2019 related to multiple strokes, crack cocaine abuse and chronic respiratory failure with a trach, PEG and sacral wound. Her prognosis is extremely poor and she is septic.   Palliative consult received 6/2- attempted to contact NOK unsuccessfully- without their guidance comfort care is the most appropriate care plan. I reviewed palliative notes from prior admission-there are complicated dynamics with daughter.If we cannot get in touch with her daughter we will need to consider guardianship if she survives, for now however in the absence of a decision maker the physicians caring for her are charged with making best possible decisions for her. I support full comfort care as the medically responsible and compassionate way to treat her.  Lane Hacker, DO Palliative Medicine [phone]

## 2017-12-10 NOTE — Progress Notes (Signed)
Critical lab value: hgb 6.6. RN paged MD. Awaiting orders.

## 2017-12-10 NOTE — Progress Notes (Signed)
Inpatient Diabetes Program Recommendations  AACE/ADA: New Consensus Statement on Inpatient Glycemic Control (2019)  Target Ranges:  Prepandial:   less than 140 mg/dL      Peak postprandial:   less than 180 mg/dL (1-2 hours)      Critically ill patients:  140 - 180 mg/dL   Results for [name], [name] (MRN [id number]) as of 12/10/2017 11:45  Ref. Range 12/09/2017 14:03 12/09/2017 16:19 12/09/2017 18:28 12/09/2017 21:35 12/10/2017 06:13  Glucose-Capillary Latest Ref Range: 65 - 99 mg/dL 538 (HH) 497 (H) 421 (H) 350 (H) 230 (H)   Review of Glycemic Control  Diabetes history: DM2 Outpatient Diabetes medications: Lantus 40 units BID, Humalog 2-10 units QID Current orders for Inpatient glycemic control: Lantus 40 units BID, Novolog 0-15 units TID with meals  Inpatient Diabetes Program Recommendations: Insulin - Basal: Please consider increasing Lantus to 43 units BID. Correction (SSI): Please consider increasing Novolog correction to Resistant scale (0-20 units) and increase frequency to Q4H.  Thanks, Barnie Alderman, RN, MSN, CDE Diabetes Coordinator Inpatient Diabetes Program [phone] (Team Pager from 8am to 5pm)

## 2017-12-10 NOTE — Progress Notes (Signed)
Called daughter (next of kin) in order to get consent for blood. Pt unable to give consent. Daughter did not answer the phone. RN left message for daughter to call the unit. Will continue to follow.

## 2017-12-10 NOTE — Plan of Care (Signed)
[name] is unfortunately not able to communicate to [place] so unable to get consent from her. Medical team has not been able to reach family for consent on multiple phone calls. Given significantly low hemoglobin in a septic patient advise emergent release to give blood transfusion despite no consent from family or patient. [facility]

## 2017-12-11 DIAGNOSIS — R740 Nonspecific elevation of levels of transaminase and lactic acid dehydrogenase [LDH]: Secondary | ICD-10-CM

## 2017-12-11 DIAGNOSIS — E870 Hyperosmolality and hypernatremia: Secondary | ICD-10-CM

## 2017-12-11 DIAGNOSIS — R739 Hyperglycemia, unspecified: Secondary | ICD-10-CM

## 2017-12-11 LAB — CBC
HCT: 33.4 % — ABNORMAL LOW (ref 36.0–46.0)
Hemoglobin: 10.1 g/dL — ABNORMAL LOW (ref 12.0–15.0)
MCH: 29.2 pg (ref 26.0–34.0)
MCHC: 30.2 g/dL (ref 30.0–36.0)
MCV: 96.5 fL (ref 78.0–100.0)
PLATELETS: 243 10*3/uL (ref 150–400)
RBC: 3.46 MIL/uL — AB (ref 3.87–5.11)
RDW: 19.3 % — AB (ref 11.5–15.5)
WBC: 23.3 10*3/uL — AB (ref 4.0–10.5)

## 2017-12-11 LAB — COMPREHENSIVE METABOLIC PANEL
ALT: 106 U/L — AB (ref 14–54)
AST: 174 U/L — ABNORMAL HIGH (ref 15–41)
Albumin: 1.5 g/dL — ABNORMAL LOW (ref 3.5–5.0)
Alkaline Phosphatase: 63 U/L (ref 38–126)
Anion gap: 15 (ref 5–15)
BUN: 168 mg/dL — AB (ref 6–20)
CHLORIDE: 127 mmol/L — AB (ref 101–111)
CO2: 21 mmol/L — ABNORMAL LOW (ref 22–32)
CREATININE: 1.8 mg/dL — AB (ref 0.44–1.00)
Calcium: 9.1 mg/dL (ref 8.9–10.3)
GFR calc non Af Amer: 29 mL/min — ABNORMAL LOW (ref >60)
GFR, EST AFRICAN AMERICAN: 34 mL/min — AB (ref >60)
Glucose, Bld: 156 mg/dL — ABNORMAL HIGH (ref 65–99)
POTASSIUM: 2.6 mmol/L — AB (ref 3.5–5.1)
SODIUM: 163 mmol/L — AB (ref 135–145)
Total Bilirubin: 0.6 mg/dL (ref 0.3–1.2)
Total Protein: 7.4 g/dL (ref 6.5–8.1)

## 2017-12-11 LAB — GLUCOSE, CAPILLARY
GLUCOSE-CAPILLARY: 112 mg/dL — AB (ref 65–99)
GLUCOSE-CAPILLARY: 66 mg/dL (ref 65–99)
GLUCOSE-CAPILLARY: 99 mg/dL (ref 65–99)
GLUCOSE-CAPILLARY: 170 mg/dL — AB (ref 65–99)
GLUCOSE-CAPILLARY: 205 mg/dL — AB (ref 65–99)
Glucose-Capillary: 188 mg/dL — ABNORMAL HIGH (ref 65–99)
Glucose-Capillary: 316 mg/dL — ABNORMAL HIGH (ref 65–99)
Glucose-Capillary: 73 mg/dL (ref 65–99)

## 2017-12-11 LAB — TYPE AND SCREEN
ABO/RH(D): O POS
Antibody Screen: NEGATIVE
UNIT DIVISION: 0
Unit division: 0

## 2017-12-11 LAB — MRSA PCR SCREENING: MRSA BY PCR: NEGATIVE

## 2017-12-11 LAB — BPAM RBC
BLOOD PRODUCT EXPIRATION DATE: 201906252359
Blood Product Expiration Date: 201906252359
ISSUE DATE / TIME: 201906031521
ISSUE DATE / TIME: 201906031827
UNIT TYPE AND RH: 5100
UNIT TYPE AND RH: 5100

## 2017-12-11 LAB — SODIUM: Sodium: 163 mmol/L (ref 135–145)

## 2017-12-11 MED ORDER — FREE WATER
400.0000 mL | Status: DC
  Administered 2017-12-11 – 2017-12-16 (×59): 400 mL

## 2017-12-11 MED ORDER — JEVITY 1.2 CAL PO LIQD
1000.0000 mL | ORAL | Status: DC
  Administered 2017-12-11: 55 mL
  Administered 2017-12-13 – 2017-12-15 (×3): 1000 mL
  Filled 2017-12-11 (×11): qty 1000

## 2017-12-11 MED ORDER — DAKINS (1/4 STRENGTH) 0.125 % EX SOLN
Freq: Three times a day (TID) | CUTANEOUS | Status: AC
  Administered 2017-12-11 – 2017-12-14 (×9)
  Filled 2017-12-11 (×2): qty 473

## 2017-12-11 MED ORDER — POTASSIUM CHLORIDE 10 MEQ/100ML IV SOLN
10.0000 meq | INTRAVENOUS | Status: AC
  Administered 2017-12-11 (×3): 10 meq via INTRAVENOUS
  Filled 2017-12-11 (×3): qty 100

## 2017-12-11 MED ORDER — SODIUM CHLORIDE 0.45 % IV SOLN
INTRAVENOUS | Status: DC
  Administered 2017-12-11 – 2017-12-12 (×3): via INTRAVENOUS

## 2017-12-11 MED ORDER — POTASSIUM CHLORIDE 10 MEQ/100ML IV SOLN
10.0000 meq | INTRAVENOUS | Status: DC
  Administered 2017-12-11 (×3): 10 meq via INTRAVENOUS
  Filled 2017-12-11 (×3): qty 100

## 2017-12-11 NOTE — Progress Notes (Signed)
Initial Nutrition Assessment  DOCUMENTATION CODES:   Not applicable  INTERVENTION:    Initiate Jevity 1.2 at 15 ml/hr and increase by 10 ml every 4 hours to goal rate of 55 ml/hr  Prostat liquid protein 30 ml TID via tube  Provides 1884 kcals, 118 gm protein, 1065 ml of free water   Juven 1 packet BID via tube, contains glutamine and arginine  NUTRITION DIAGNOSIS:   Increased nutrient needs related to chronic illness, wound healing as evidenced by estimated needs  GOAL:   Patient will meet greater than or equal to 90% of their needs  MONITOR:   TF tolerance, Labs, Weight trends, Skin, I & O's  REASON FOR ASSESSMENT:   Consult Enteral/tube feeding initiation and management  ASSESSMENT:   [name] who is normally in a vegetative state was transferred from her nursing care facility because of fever.  She was recently discovered to have a sacral decubitus.  She has had frequent episodes of pneumonia.  Pt admitted from [facility].  She is nonverbal and in a persistent vegetative state. Pt is trach dependent and receives all nutrition via PEG tube. Per PTA medication list, pt receives Prostat & Juven supplements via tube.  Attempted to determine TF regimen at SNF. Unable to speak with RN. Medications include MVI, folvite, colace and thiamine. Labs reviewed. Na 163 (H). K 2.6 (L).  CBG's [id number].  Spoke with Dr. Lonny Prude regarding nutrition care plan.  NUTRITION - FOCUSED PHYSICAL EXAM:  Unable to complete at this time.  Diet Order:   Diet Order           Diet NPO time specified  Diet effective now         EDUCATION NEEDS:   Not appropriate for education at this time  Skin:  Skin Assessment: Skin Integrity Issues: Skin Integrity Issues:: Unstageable, Other (Comment) Unstageable: sacrum, R IT Other: R lateral & R medial full thickness wounds  Last BM:  6/3  Height:   Ht Readings from Last 1 Encounters:  12/09/17 5\' 4"  (1.626 m)   Weight:    Wt Readings from Last 1 Encounters:  12/10/17 171 lb 11.8 oz (77.9 kg)   Ideal Body Weight:  54.5 kg   Intake/Output Summary (Last 24 hours) at 12/11/2017 1423 Last data filed at 12/11/2017 1004 Gross per 24 hour  Intake 1165 ml  Output 1475 ml  Net -310 ml   BMI:  Body mass index is 29.48 kg/m.  Estimated Nutritional Needs:   Kcal:  1700-1900  Protein:  100-115 gm  Fluid:  1.7-1.9 L  Arthur Holms, RD, LDN Pager #: [phone] After-Hours Pager #: [phone]

## 2017-12-11 NOTE — Clinical Social Work Note (Signed)
Patient is a long-term resident from [facility]. Admissions coordinator does not have any other contact numbers for family than we have.  [name], [place]

## 2017-12-11 NOTE — Care Management Note (Signed)
Case Management Note Marvetta Gibbons RN, BSN Unit 4E-Case Manager [phone]  Patient Details  Name: [name] MRN: [id number] Date of Birth: 03/11/1956  Subjective/Objective:     Pt admitted with sepsis, large IV sacral decubitus ulcer             Action/Plan: PTA pt was at [facility], hx of persistent vegetative state, trach and peg. Anticipate return to SNF. CSW to follow for transition of care needs- MD/staff unable to reach mother on admission.   Expected Discharge Date:                  Expected Discharge Plan:  Skilled Nursing Facility  In-House Referral:  Clinical Social Work  Discharge planning Services  CM Consult  Post Acute Care Choice:    Choice offered to:     DME Arranged:    DME Agency:     HH Arranged:    [place] Agency:     Status of Service:  In process, will continue to follow  If discussed at Long Length of Stay Meetings, dates discussed:    Discharge Disposition:   Additional Comments:  Dawayne Patricia, RN 12/11/2017, 11:50 AM

## 2017-12-11 NOTE — Progress Notes (Signed)
PROGRESS NOTE  [name] IRC:[id number] DOB: 01/26/1956 DOA: 12/09/2017 PCP: Nolene Ebbs, MD  HPI  [name] is a 62 y.o. 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    Of note patient was last seen in the hospital on 4/29-5/10 for anemia requiring blood transfusion after previous episode of acute blood loss anemia/hemorrhagic shock related to abdominal wall hematoma.    Subjective Not able to communicate.   Assessment/Plan: Principal Problem:   Sepsis ([place]) Active Problems:   Methicillin resistant Staphylococcus aureus infection   Type II diabetes mellitus with renal manifestations (HCC)   Tracheostomy status (HCC)   Chronic diastolic CHF (congestive heart failure) (HCC)   Essential hypertension   Diabetes mellitus type 2, uncontrolled, with complications (HCC)   Persistent vegetative state ([place])   Cognitive dysfunction due to old stroke   Palliative care encounter   Sacral ulcer ([place])   1. Sepsis with proteus/enterobacter bacteremia, presumed secondary to sacral decubitus ulcer and right lower leg ulcer (present prior to admission), improving. No longer febrile, leukocytosis improving. Wounds evaluated by wound nurse and dressing applied  ideally would need debridement for ultimate healing/resolution of infection but patient's overall goals of care need to be addressed. Ceftriaxone until sensitivities finalize.CXR with no signs of infection. UA with large leukocytes but rare bacteria.   2. Goals of Care. I discussed with Dr. Hilma Favors with Palliative Care patient's poor prognosis given subpar baseline clinical status. Unfortunately all members of team have been unable to reach daughter ( [name]). We have even had S/W reach out to SNF for other contact information. If still unable to reach daughter I agree with Dr. Hilma Favors that in the absence of a primary decision maker comfort care would be a compassionate and medically responsible treatment for her.  3. Acute on chronic anemia. No current signs of bleeding. S/p 2 U PRBC on admission with hgb from 6.6- 10.1(likely hemoconcentration in setting of dehydration). Baseline 7.5-8.5. Monitor daily CBC.   4. Acute on chronic CKD. Baseline creatinine 1. Peak of 2.49  Improved to 1.8. Daily BMP. Avoid nephrotoxins  5. Elevated LFTs, stable, Likely element of shock liver in setting of sepsis and profound dehydration. Tylenol < 10.  Already improved significantly with fluids and antibiotics  6. Acute hypernatremia, worsening. Worse after stopping 1/2 NS yesterday without starting free water flushes and no tube feeds. Had previously resolved in 24 hours. 5.8 L free water deficit. Start TF, 1/2 NS 75 cc/hr, free water flushes 400 cc q 2 hours. Serial BMP   7. Chronic respiratory failure s/p trach. RT to follow for trach site maintenance. CXR no PNA or edema. Chronic vascular congestion on CXR. Scopolamine for secretions  8. Type 2 Diabetes, (A1c 8.2)poorly controlled but improving. Glucose > 500 on admission with no anion gap followed by hypoglycemia while off tube fees. Have resumed  tube feeds . Appreciate Diabetes Recs: Lantus to 43 U BID, Novolog correction, resistant scale  q4H  9. Hypertension, at goal. No hypotension or lactic acidosis with sepsis on presentation. Continue amlodipine, clonidine, labetalol, and hydralazine  10. Status Epilepticus, persistently vegetative state. Per previous hospitalization notes this is her baseline. Continue home topamax, phenytoin, and keppra, and lacosamide. PRN ativan  11. Hyperlipidemia. Hold home statin in setting of elevated LFTs.   12. GERD. Protonix  Code Status: DNR   Family Communication: Unable to reach daughter [name] despite several attempts from multiple members of medical staff  Disposition Plan: Continue narrowed antibiotics, monitor culture data, f/u serial sodium. We will continue to try to reach daughter.  If we still are unable to reach her I agree with Dr. Hilma Favors (Palliative) that given her baseline poor quality of life with this infection and poor prognosis clinically that comfort care would be the best most compassionate route.     Consultants:  Palliative  Procedures:  Blood transfusion : 2U, 6/3   Antimicrobials:  Vancomycin6/2  Zosyn 6/2  Ceftriaxone 6/3  Cultures:  Blood: Proteus/Enterobacter, 12/09/17   Telemetry: yes  DVT prophylaxis: SCDs   Objective: Vitals:   12/11/17 0800 12/11/17 0824 12/11/17 1127 12/11/17 1200  BP: (!) 152/83 (!) 152/83  (!) 161/73  Pulse: 90 88 88 95  Resp: (!) 24 (!) 29 (!) 26 (!) 25  Temp: 99.5 F (37.5 C)   99 F (37.2 C)  TempSrc: Axillary   Axillary  SpO2: 100% 100% 100% 100%  Weight:      Height:        Intake/Output Summary (Last 24 hours) at 12/11/2017 1348 Last data filed at 12/11/2017 1004 Gross per 24 hour  Intake 1165 ml  Output 1475 ml  Net -310 ml   Filed Weights   12/09/17 0654 12/10/17 0459  Weight: 83.5 kg (184 lb) 77.9 kg (171 lb 11.8 oz)    Exam:  Constitutional:chronically ill appearing female Eyes: open throughout exam, ENMT: Oropharynx with dry mucous membranes, Cardiovascular: RRR no MRGs, with no  peripheral edema Respiratory: Normal respiratory effort with increased gurgling at trach site and on all lung fields Abdomen: Soft,non-tender, PEG tube in place no surrounding erythema or drainage Neurologic: Can not communicate. Unresponsive to voice.      Data Reviewed: CBC: Recent Labs  Lab 12/09/17 0658 12/10/17 0304 12/10/17 2313 12/11/17 0322  WBC 27.9* 22.4*  --  23.3*  NEUTROABS 22.0*  --   --   --   HGB 8.7* 6.6* 9.9* 10.1*  HCT 30.0* 22.8* 32.4* 33.4*  MCV 104.9* 106.0*  --  96.5  PLT 291 208  --  924   Basic Metabolic Panel: Recent Labs  Lab 12/09/17 0658 12/09/17 1858 12/10/17 0304 12/11/17 0322 12/11/17 1111  NA 156* 150* 143 163* 163*  K 4.6 3.8 3.0* 2.6*  --   CL 119* 116* 115* 127*  --   CO2 23 20* 18* 21*  --   GLUCOSE 594* 524* 237* 156*  --   BUN 189* 185* 160* 168*  --   CREATININE 2.49* 2.36* 2.03* 1.80*  --   CALCIUM 8.8* 8.1* 7.1* 9.1  --    GFR: Estimated Creatinine Clearance: 33.2 mL/min (A) (by C-G formula based on SCr of 1.8 mg/dL (H)). Liver Function Tests: Recent Labs  Lab 12/09/17 0658 12/10/17 0304 12/11/17 0322  AST 398* 163* 174*  ALT 157* 98* 106*  ALKPHOS 57 44 63  BILITOT 0.5 0.6  0.6  PROT 7.8 6.0* 7.4  ALBUMIN 1.7* 1.3* 1.5*   No results for input(s): LIPASE, AMYLASE in the last 168 hours. No results for input(s): AMMONIA in the last 168 hours. Coagulation Profile: Recent Labs  Lab 12/09/17 0658 12/09/17 1650  INR 1.55 1.65   Cardiac Enzymes: No results for input(s): CKTOTAL, CKMB, CKMBINDEX, TROPONINI in the last 168 hours. BNP (last 3 results) No results for input(s): PROBNP in the last 8760 hours. HbA1C: Recent Labs    12/09/17 1650  HGBA1C 8.2*   CBG: Recent Labs  Lab 12/10/17 2333 12/11/17 0431 12/11/17 0803 12/11/17 0951 12/11/17 1144  GLUCAP 316* 112* 66 73 99   Lipid Profile: No results for input(s): CHOL, HDL, LDLCALC, TRIG, CHOLHDL, LDLDIRECT in the last 72 hours. Thyroid Function  Tests: No results for input(s): TSH, T4TOTAL, FREET4, T3FREE, THYROIDAB in the last 72 hours. Anemia Panel: No results for input(s): VITAMINB12, FOLATE, FERRITIN, TIBC, IRON, RETICCTPCT in the last 72 hours. Urine analysis:    Component Value Date/Time   COLORURINE AMBER (A) 12/09/2017 0758   APPEARANCEUR CLOUDY (A) 12/09/2017 0758   LABSPEC 1.016 12/09/2017 0758   PHURINE 9.0 (H) 12/09/2017 0758   GLUCOSEU 50 (A) 12/09/2017 0758   GLUCOSEU > 1000 mg/dL (A) 11/01/2007 2056   HGBUR NEGATIVE 12/09/2017 0758   HGBUR negative 07/15/2007 0954   BILIRUBINUR NEGATIVE 12/09/2017 0758   KETONESUR NEGATIVE 12/09/2017 0758   PROTEINUR >=300 (A) 12/09/2017 0758   UROBILINOGEN 1.0 04/09/2012 1524   NITRITE NEGATIVE 12/09/2017 0758   LEUKOCYTESUR LARGE (A) 12/09/2017 0758   Sepsis Labs: @LABRCNTIP (procalcitonin:4,lacticidven:4)  ) Recent Results (from the past 240 hour(s))  Culture, blood (Routine x 2)     Status: Abnormal (Preliminary result)   Collection Time: 12/09/17  7:00 AM  Result Value Ref Range Status   Specimen Description BLOOD RIGHT HAND  Final   Special Requests   Final    BOTTLES DRAWN AEROBIC AND ANAEROBIC Blood Culture adequate volume   Culture  Setup Time   Final    GRAM NEGATIVE RODS AEROBIC BOTTLE ONLY CRITICAL RESULT CALLED TO, READ BACK BY AND VERIFIED WITH: E MARTIN,PHARMD AT 4008 12/10/17 BY L BENFIELD    Culture (A)  Final    PROTEUS MIRABILIS SUSCEPTIBILITIES TO FOLLOW Performed at [facility], [place] [address] [place], [place] [address]    Report Status PENDING  Incomplete  Blood Culture ID Panel (Reflexed)     Status: Abnormal   Collection Time: 12/09/17  7:00 AM  Result Value Ref Range Status   Enterococcus species NOT DETECTED NOT DETECTED Final   Listeria monocytogenes NOT DETECTED NOT DETECTED Final   Staphylococcus species NOT DETECTED NOT DETECTED Final   Staphylococcus aureus NOT DETECTED NOT DETECTED Final   Streptococcus species NOT  DETECTED NOT DETECTED Final   Streptococcus agalactiae NOT DETECTED NOT DETECTED Final   Streptococcus pneumoniae NOT DETECTED NOT DETECTED Final   Streptococcus pyogenes NOT DETECTED NOT DETECTED Final   Acinetobacter baumannii NOT DETECTED NOT DETECTED Final   Enterobacteriaceae species DETECTED (A) NOT DETECTED Final    Comment: Enterobacteriaceae represent a large family of gram-negative bacteria, not a single organism. CRITICAL RESULT CALLED TO, READ BACK BY AND VERIFIED WITH: E MARTIN,PHARMD AT 5093 12/10/17 BY L BENFIELD    Enterobacter cloacae complex NOT DETECTED NOT DETECTED Final   Escherichia coli NOT DETECTED NOT DETECTED Final   Klebsiella oxytoca NOT DETECTED NOT DETECTED Final   Klebsiella pneumoniae NOT DETECTED NOT DETECTED Final  Proteus species DETECTED (A) NOT DETECTED Final    Comment: CRITICAL RESULT CALLED TO, READ BACK BY AND VERIFIED WITH: E MARTIN,PHARMD AT 7672 12/10/17 BY L BENFIELD    Serratia marcescens NOT DETECTED NOT DETECTED Final   Carbapenem resistance NOT DETECTED NOT DETECTED Final   Haemophilus influenzae NOT DETECTED NOT DETECTED Final   Neisseria meningitidis NOT DETECTED NOT DETECTED Final   Pseudomonas aeruginosa NOT DETECTED NOT DETECTED Final   Candida albicans NOT DETECTED NOT DETECTED Final   Candida glabrata NOT DETECTED NOT DETECTED Final   Candida krusei NOT DETECTED NOT DETECTED Final   Candida parapsilosis NOT DETECTED NOT DETECTED Final   Candida tropicalis NOT DETECTED NOT DETECTED Final    Comment: Performed at [facility], [place] [address] [place], [place] [address]  Culture, blood (Routine x 2)     Status: None (Preliminary result)   Collection Time: 12/09/17  6:58 PM  Result Value Ref Range Status   Specimen Description BLOOD BLOOD LEFT WRIST  Final   Special Requests   Final    BOTTLES DRAWN AEROBIC ONLY Blood Culture adequate volume   Culture   Final    NO GROWTH < 24 HOURS Performed at [facility], [place] [address], [place], [place] [address]    Report Status PENDING  Incomplete  MRSA PCR Screening     Status: None   Collection Time: 12/11/17  5:36 AM  Result Value Ref Range Status   MRSA by PCR NEGATIVE NEGATIVE Final    Comment:        The GeneXpert MRSA Assay (FDA approved for NASAL specimens only), is one component of a comprehensive MRSA colonization surveillance program. It is not intended to diagnose MRSA infection nor to guide or monitor treatment for MRSA infections. Performed at [facility], [place] [address] [place], [place] [address]       Studies: No results found.  Scheduled Meds: . amLODipine  10 mg Per Tube Daily  . cloNIDine  0.2 mg Per Tube TID  . feeding supplement (PRO-STAT SUGAR FREE 64)  30 mL Per Tube TID  . folic acid  1 mg Oral Daily  . hydrALAZINE  100 mg Per Tube Q8H  . insulin aspart  0-20 Units Subcutaneous Q4H  . insulin glargine  43 Units Subcutaneous BID  . labetalol  400 mg Per Tube TID  . lacosamide  200 mg Per Tube BID  . levETIRAcetam  1,000 mg Per Tube BID  . multivitamin  15 mL Oral Daily  . nutrition supplement (JUVEN)  1 packet Per Tube BID BM  . pantoprazole sodium  40 mg Per Tube QHS  . phenytoin  250 mg Per Tube BID  . scopolamine  1 patch Transdermal Q72H  . sodium hypochlorite   Irrigation TID  . thiamine  100 mg Per Tube Daily  . topiramate  200 mg Per Tube BID    Continuous Infusions: . sodium chloride 75 mL/hr at 12/11/17 0600  . sodium chloride    . cefTRIAXone (ROCEPHIN)  IV Stopped (12/10/17 1300)  . potassium chloride 10 mEq (12/11/17 1253)     LOS: 2 days     Desiree Hane, MD [facility] Pager [phone]  If 7PM-7AM, please contact night-coverage [email or web address] Password [email or web address] 12/11/2017, 1:48 PM

## 2017-12-11 NOTE — Consult Note (Signed)
[facility] Nurse wound consult note Reason for Consult: Large Unstageable Pressure injury to sacrum, right IT Unstageable wound, Right lateral LE full thickness wound with tissue destruction exposing tendon and right medial LE full thickness wound. Wound type:Pressure Pressure Injury POA: Yes Measurement: Sacral:  9 cm x 16.5 Unstageable Pressure Injury eschar with fluctuance and foul odor. Foul odor consistent with necrotic tissue. Small amount of purulent exudate. Right IT: 3.5cm x 2.5cm Unstageable Pressure injury (full thickness) for which autolytic debridement is incomplete.  40% of wound is pink, moist, 60% of wound is with necrotic eschar and slough Right lateral LE:  7cm x 4cm  Incomplete autolytically debriding eschar revealing deep subcutaneous structures such as tendon. Moderate amount of yellow exudate with strong odor consistent with necrotic tissue.  Right medial LE wound:  5cm x 4cm full thickness wound with pink, moist wound bed and small amount of serous exudate, no odor. Wound bed: As described above Drainage (amount, consistency, odor) As described above Periwound: Intact, dry Dressing procedure/placement/frequency: Patient is incontinent of large amount of liquid stool during my assessment. She cries during the turning and repositioning required to clean to assess the wounds. The two wounds of greatest severity (sacrum and right lateral LE) are likely non healable. I recommend a maintenance POC, however sharp debridement of the sacral wound may be considered to address resolution of sepsis if that is consistent with the overall POC. If you agree, please consult [facility] for evaluation/recommendations.  In the meantime, I have implemented a conservative POC using TID and PRN soiling dressings moistened with sodium hypochlorite solution (Dakin's solution) to the sacrum, right IT and the right lateral LE. Upon expiration of the Dakin's solution order (in 3 days or 9 dressings), I have provided  guidance to change to NS dressings with the same frequency.  The right medial LE can be dressed once daily with white petrolatum gauze.  Pressure redistribution heel boots are in place bilaterally, the patient is immersed in a mattress replacement with low air loss feature and she is being turned and repositioned per house protocol.  House skin care products are being used to cleanse following incontinence episodes and there is an indwelling urinary catheter.   [facility] nursing team will not follow, but will remain available to this patient, the nursing and medical teams.  Please re-consult if needed. Thanks, Maudie Flakes, MSN, RN, [place], Arther Abbott  Pager# [phone]

## 2017-12-11 NOTE — Progress Notes (Signed)
Inpatient Diabetes Program Recommendations  AACE/ADA: New Consensus Statement on Inpatient Glycemic Control (2015)  Target Ranges:  Prepandial:   less than 140 mg/dL      Peak postprandial:   less than 180 mg/dL (1-2 hours)      Critically ill patients:  140 - 180 mg/dL   Results for LOETA, HERST (MRN [id number]) as of 12/11/2017 10:53  Ref. Range 12/10/2017 06:13 12/10/2017 12:04 12/10/2017 17:09 12/10/2017 21:07 12/10/2017 23:33 12/11/2017 04:31 12/11/2017 08:03  Glucose-Capillary Latest Ref Range: 65 - 99 mg/dL 230 (H)  Novolog 5 units 231 (H)  Lantus 40 units  Novolog 5 units 337 (H)  Novolog 11 units 277 (H)  Novolog 11 units @ 22:07 316 (H)  Novolog 15 units @ 00:09 112 (H) 66   Review of Glycemic Control  Diabetes history: DM2 Outpatient Diabetes medications: Lantus 40 units BID, Humalog 2-10 units QID Current orders for Inpatient glycemic control: Lantus 43 units BID, Novolog 0-20 units Q4H    NOTE: In reviewing chart, noted patient received Novolog 11 units at 22:07 on 12/10/17 and Novolog 15 units 2 hours later at 00:09 on 12/11/17. Receiving Novolog correction insulin so close together likely contributed to fasting glucose of 66 mg/dl today. Do NOT recommend changing Levemir dose at this time. RNs please administer Novolog correction Q4H as ordered. As Novolog has a duration of 4-6 hours and if given closer than Q4H can cause insulin stacking and lead to hypoglycemia.  Thanks, Barnie Alderman, RN, MSN, CDE Diabetes Coordinator Inpatient Diabetes Program [phone] (Team Pager from 8am to 5pm)

## 2017-12-12 DIAGNOSIS — I5032 Chronic diastolic (congestive) heart failure: Secondary | ICD-10-CM

## 2017-12-12 DIAGNOSIS — I69319 Unspecified symptoms and signs involving cognitive functions following cerebral infarction: Secondary | ICD-10-CM

## 2017-12-12 DIAGNOSIS — N179 Acute kidney failure, unspecified: Secondary | ICD-10-CM

## 2017-12-12 LAB — CULTURE, BLOOD (ROUTINE X 2): Special Requests: ADEQUATE

## 2017-12-12 LAB — COMPREHENSIVE METABOLIC PANEL
ALBUMIN: 1.4 g/dL — AB (ref 3.5–5.0)
ALK PHOS: 82 U/L (ref 38–126)
ALT: 97 U/L — AB (ref 14–54)
AST: 166 U/L — AB (ref 15–41)
Anion gap: 9 (ref 5–15)
BUN: 124 mg/dL — ABNORMAL HIGH (ref 6–20)
CALCIUM: 8.5 mg/dL — AB (ref 8.9–10.3)
CO2: 18 mmol/L — AB (ref 22–32)
CREATININE: 1.35 mg/dL — AB (ref 0.44–1.00)
Chloride: 129 mmol/L — ABNORMAL HIGH (ref 101–111)
GFR calc Af Amer: 48 mL/min — ABNORMAL LOW (ref >60)
GFR calc non Af Amer: 41 mL/min — ABNORMAL LOW (ref >60)
GLUCOSE: 177 mg/dL — AB (ref 65–99)
Potassium: 2.7 mmol/L — CL (ref 3.5–5.1)
SODIUM: 156 mmol/L — AB (ref 135–145)
Total Bilirubin: 0.4 mg/dL (ref 0.3–1.2)
Total Protein: 7 g/dL (ref 6.5–8.1)

## 2017-12-12 LAB — GLUCOSE, CAPILLARY
GLUCOSE-CAPILLARY: 171 mg/dL — AB (ref 65–99)
GLUCOSE-CAPILLARY: 187 mg/dL — AB (ref 65–99)
GLUCOSE-CAPILLARY: 187 mg/dL — AB (ref 65–99)
Glucose-Capillary: 163 mg/dL — ABNORMAL HIGH (ref 65–99)
Glucose-Capillary: 160 mg/dL — ABNORMAL HIGH (ref 65–99)
Glucose-Capillary: 157 mg/dL — ABNORMAL HIGH (ref 65–99)

## 2017-12-12 LAB — BASIC METABOLIC PANEL
Anion gap: 13 (ref 5–15)
BUN: 133 mg/dL — AB (ref 6–20)
CHLORIDE: 126 mmol/L — AB (ref 101–111)
CO2: 17 mmol/L — AB (ref 22–32)
CREATININE: 1.39 mg/dL — AB (ref 0.44–1.00)
Calcium: 8.7 mg/dL — ABNORMAL LOW (ref 8.9–10.3)
GFR calc Af Amer: 46 mL/min — ABNORMAL LOW (ref >60)
GFR calc non Af Amer: 40 mL/min — ABNORMAL LOW (ref >60)
Glucose, Bld: 225 mg/dL — ABNORMAL HIGH (ref 65–99)
POTASSIUM: 5.1 mmol/L (ref 3.5–5.1)
SODIUM: 156 mmol/L — AB (ref 135–145)

## 2017-12-12 LAB — CBC
HCT: 31.4 % — ABNORMAL LOW (ref 36.0–46.0)
HEMOGLOBIN: 9.6 g/dL — AB (ref 12.0–15.0)
MCH: 29.6 pg (ref 26.0–34.0)
MCHC: 30.6 g/dL (ref 30.0–36.0)
MCV: 96.9 fL (ref 78.0–100.0)
PLATELETS: 238 10*3/uL (ref 150–400)
RBC: 3.24 MIL/uL — AB (ref 3.87–5.11)
RDW: 19.1 % — ABNORMAL HIGH (ref 11.5–15.5)
WBC: 21.9 10*3/uL — AB (ref 4.0–10.5)

## 2017-12-12 LAB — SODIUM
SODIUM: 153 mmol/L — AB (ref 135–145)
Sodium: 156 mmol/L — ABNORMAL HIGH (ref 135–145)

## 2017-12-12 LAB — POTASSIUM: Potassium: 2.9 mmol/L — ABNORMAL LOW (ref 3.5–5.1)

## 2017-12-12 LAB — MAGNESIUM: Magnesium: 2.3 mg/dL (ref 1.7–2.4)

## 2017-12-12 MED ORDER — ACETAMINOPHEN 160 MG/5ML PO SOLN
650.0000 mg | ORAL | Status: DC | PRN
  Administered 2017-12-12 – 2017-12-15 (×6): 650 mg
  Filled 2017-12-12 (×8): qty 20.3

## 2017-12-12 MED ORDER — POTASSIUM CHLORIDE 10 MEQ/100ML IV SOLN
10.0000 meq | INTRAVENOUS | Status: AC
  Administered 2017-12-12 (×3): 10 meq via INTRAVENOUS
  Filled 2017-12-12 (×2): qty 100

## 2017-12-12 MED ORDER — POTASSIUM CHLORIDE 2 MEQ/ML IV SOLN
INTRAVENOUS | Status: DC
  Administered 2017-12-12: 17:00:00 via INTRAVENOUS
  Filled 2017-12-12 (×6): qty 1000

## 2017-12-12 NOTE — Progress Notes (Addendum)
[facility]                                                                              Patient Demographics  [name], is a 62 y.o. female, DOB - 05-26-1956, VHQ:[id number]  Admit date - 12/09/2017   Admitting Physician Lady Deutscher, MD  Outpatient Primary MD for the patient is Nolene Ebbs, MD  Outpatient specialists:   LOS - 3  days   Medical records reviewed and are as summarized below:    Chief Complaint  Patient presents with  . Fever       Brief summary   Patient is a 62 year old female with a history of watershed infarcts, status epilepticus, hypoxic respiratory failure, status post trach, PEG tube, polysubstance abuse (cocaine, tobacco, alcohol), hypertension, hyperlipidemia, uncontrolled diabetes, RUE DVT, multiple hospitalization, has been in a persistent vegetative state was brought from her skilled nursing facility with a fever of 103 F, found to have sepsis presumed secondary to sacral decubitus ulcer, right lower leg ulcer.  Patient was also seen in the hospital 4/29-5/10 for anemia requiring blood transfusions after previous episode of hemorrhagic shock related to abdominal wall hematoma   Assessment & Plan    Principal Problem:   Sepsis ([place]) with Proteus/Enterobacter bacteremia, presumed secondary to sacral decub ulcer, right lower extremity ulcer present prior to admission -Leukocytosis improving, wound care following closely for dressings, will need debridement for ultimate healing and resolution -Continue IV antibiotics, goals of care pending  Active Problems:    Type II diabetes mellitus with renal manifestations ([place]) -Hemoglobin A1c 8.2, poorly controlled, continue tube feeds  Chronic respiratory failure with tracheostomy status ([place]) Continue to monitor, O2 Respiratory therapy following for trach site maintenance.  Chest x-ray showed no pneumonia.  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 Called patient's daughter but went to voicemail.  Will d/w palliative in am for goals of care.    Disposition Plan: Awaiting further plan from the family, palliative  Time Spent in minutes 25  minutes  Procedures:  None  Consultants:   Palliative med  Antimicrobials:      Medications  Scheduled Meds: . amLODipine  10 mg Per Tube Daily  . cloNIDine  0.2 mg Per Tube TID  . feeding supplement (PRO-STAT SUGAR FREE 64)  30 mL Per Tube TID  . folic acid  1 mg Oral Daily  . free water  400 mL Per Tube Q2H  . hydrALAZINE  100 mg Per Tube Q8H  . insulin aspart  0-20 Units Subcutaneous Q4H  . insulin glargine  43 Units Subcutaneous BID  . labetalol  400 mg Per Tube  TID  . lacosamide  200 mg Per Tube BID  . levETIRAcetam  1,000 mg Per Tube BID  . multivitamin  15 mL Oral Daily  . nutrition supplement (JUVEN)  1 packet Per Tube BID BM  . pantoprazole sodium  40 mg Per Tube QHS  . phenytoin  250 mg Per Tube BID  . scopolamine  1 patch Transdermal Q72H  . sodium hypochlorite   Irrigation TID  . thiamine  100 mg Per Tube Daily  . topiramate  200 mg Per Tube BID   Continuous Infusions: . sodium chloride 75 mL/hr at 12/11/17 1939  . cefTRIAXone (ROCEPHIN)  IV Stopped (12/12/17 1351)  . feeding supplement (JEVITY 1.2 CAL) 45 mL/hr at 12/12/17 0600  . potassium chloride 10 mEq (12/12/17 1352)   PRN Meds:.albuterol, bisacodyl,  docusate, HYDROcodone-acetaminophen, LORazepam, ondansetron **OR** ondansetron (ZOFRAN) IV, senna-docusate   Antibiotics   Anti-infectives (From admission, onward)   Start     Dose/Rate Route Frequency Ordered Stop   12/10/17 1200  cefTRIAXone (ROCEPHIN) 2 g in sodium chloride 0.9 % 100 mL IVPB     2 g 200 mL/hr over 30 Minutes Intravenous Every 24 hours 12/10/17 0946     12/10/17 0900  vancomycin (VANCOCIN) IVPB 1000 mg/200 mL premix  Status:  Discontinued     1,000 mg 200 mL/hr over 60 Minutes Intravenous Every 24 hours 12/09/17 0816 12/10/17 0946   12/09/17 1400  piperacillin-tazobactam (ZOSYN) IVPB 3.375 g  Status:  Discontinued     3.375 g 12.5 mL/hr over 240 Minutes Intravenous Every 8 hours 12/09/17 0816 12/10/17 0946   12/09/17 0715  piperacillin-tazobactam (ZOSYN) IVPB 3.375 g     3.375 g 100 mL/hr over 30 Minutes Intravenous  Once 12/09/17 0704 12/09/17 0912   12/09/17 0715  vancomycin (VANCOCIN) IVPB 1000 mg/200 mL premix  Status:  Discontinued     1,000 mg 200 mL/hr over 60 Minutes Intravenous  Once 12/09/17 0704 12/09/17 0706   12/09/17 0715  vancomycin (VANCOCIN) 1,500 mg in sodium chloride 0.9 % 500 mL IVPB     1,500 mg 250 mL/hr over 120 Minutes Intravenous  Once 12/09/17 0706 12/09/17 1215        Subjective:   Kiasha Perrow was seen and examined today.  Unable to obtain review of system from the patient, persistently vegetative state, low-grade temp of 100.8 F, slight withdrawal with the pain stimulus  Objective:   Vitals:   12/12/17 1015 12/12/17 1016 12/12/17 1100 12/12/17 1136  BP: (!) 167/104   (!) 155/80  Pulse:  96 94 98  Resp:    (!) 40  Temp:    (!) 100.8 F (38.2 C)  TempSrc:      SpO2:    100%  Weight:      Height:        Intake/Output Summary (Last 24 hours) at 12/12/2017 1354 Last data filed at 12/12/2017 1137 Gross per 24 hour  Intake 4903.25 ml  Output 1876 ml  Net 3027.25 ml     Wt Readings from Last 3 Encounters:  11/16/17  83.6 kg (184 lb 4.9 oz)  10/18/17 80.3 kg (177 lb)  09/14/17 82.1 kg (181 lb)     Exam  General: Persistently vegetative state, nonverbal  Eyes:   HEENT:  Atraumatic, normocephalic  Cardiovascular: S1 S2 auscultated, Regular rate and rhythm.  Respiratory: Clear to auscultation bilaterally, no wheezing, rales or rhonchi  Gastrointestinal: Soft, nontender, nondistended, + bowel sounds  Ext: no pedal edema bilaterally  Neuro:  does not follow commands  Musculoskeletal: No digital cyanosis, clubbing  Skin: No rashes  Psych: nonverbal   Data Reviewed:  I have personally reviewed following labs and imaging studies  Micro Results Recent Results (from the past 240 hour(s))  Culture, blood (Routine x 2)     Status: Abnormal   Collection Time: 12/09/17  7:00 AM  Result Value Ref Range Status   Specimen Description BLOOD RIGHT HAND  Final   Special Requests   Final    BOTTLES DRAWN AEROBIC AND ANAEROBIC Blood Culture adequate volume   Culture  Setup Time   Final    GRAM NEGATIVE RODS AEROBIC BOTTLE ONLY CRITICAL RESULT CALLED TO, READ BACK BY AND VERIFIED WITH: E MARTIN,PHARMD AT 8315 12/10/17 BY L BENFIELD Performed at [facility], [address], [place], [place] [address]    Culture PROTEUS MIRABILIS (A)  Final   Report Status 12/12/2017 FINAL  Final   Organism ID, Bacteria PROTEUS MIRABILIS  Final      Susceptibility   Proteus mirabilis - MIC*    AMPICILLIN <=2 SENSITIVE Sensitive     CEFAZOLIN <=4 SENSITIVE Sensitive     CEFEPIME <=1 SENSITIVE Sensitive     CEFTAZIDIME <=1 SENSITIVE Sensitive     CEFTRIAXONE <=1 SENSITIVE Sensitive     CIPROFLOXACIN >=4 RESISTANT Resistant     GENTAMICIN <=1 SENSITIVE Sensitive     IMIPENEM 2 SENSITIVE Sensitive     TRIMETH/SULFA <=20 SENSITIVE Sensitive     AMPICILLIN/SULBACTAM <=2 SENSITIVE Sensitive     PIP/TAZO <=4 SENSITIVE Sensitive     * PROTEUS MIRABILIS  Blood Culture ID Panel (Reflexed)     Status: Abnormal    Collection Time: 12/09/17  7:00 AM  Result Value Ref Range Status   Enterococcus species NOT DETECTED NOT DETECTED Final   Listeria monocytogenes NOT DETECTED NOT DETECTED Final   Staphylococcus species NOT DETECTED NOT DETECTED Final   Staphylococcus aureus NOT DETECTED NOT DETECTED Final   Streptococcus species NOT DETECTED NOT DETECTED Final   Streptococcus agalactiae NOT DETECTED NOT DETECTED Final   Streptococcus pneumoniae NOT DETECTED NOT DETECTED Final   Streptococcus pyogenes NOT DETECTED NOT DETECTED Final   Acinetobacter baumannii NOT DETECTED NOT DETECTED Final   Enterobacteriaceae species DETECTED (A) NOT DETECTED Final    Comment: Enterobacteriaceae represent a large family of gram-negative bacteria, not a single organism. CRITICAL RESULT CALLED TO, READ BACK BY AND VERIFIED WITH: E MARTIN,PHARMD AT 0737 12/10/17 BY L BENFIELD    Enterobacter cloacae complex NOT DETECTED NOT DETECTED Final   Escherichia coli NOT DETECTED NOT DETECTED Final   Klebsiella oxytoca NOT DETECTED NOT DETECTED Final   Klebsiella pneumoniae NOT DETECTED NOT DETECTED Final   Proteus species DETECTED (A) NOT DETECTED Final    Comment: CRITICAL RESULT CALLED TO, READ BACK BY AND VERIFIED WITH: E MARTIN,PHARMD AT 1062 12/10/17 BY L BENFIELD    Serratia marcescens NOT DETECTED NOT DETECTED Final   Carbapenem resistance NOT DETECTED NOT DETECTED Final   Haemophilus influenzae NOT DETECTED NOT DETECTED Final   Neisseria meningitidis NOT DETECTED NOT DETECTED Final   Pseudomonas aeruginosa NOT DETECTED NOT DETECTED Final   Candida albicans NOT DETECTED NOT DETECTED Final   Candida glabrata NOT DETECTED NOT DETECTED Final   Candida krusei NOT DETECTED NOT DETECTED Final   Candida parapsilosis NOT DETECTED NOT DETECTED Final   Candida tropicalis NOT DETECTED NOT DETECTED Final    Comment: Performed at [facility], [place]  [address], [place], [name] [address]  Culture, blood (Routine x 2)     Status:  None (Preliminary result)   Collection Time: 12/09/17  6:58 PM  Result Value Ref Range Status   Specimen Description BLOOD BLOOD LEFT WRIST  Final   Special Requests   Final    BOTTLES DRAWN AEROBIC ONLY Blood Culture adequate volume   Culture   Final    NO GROWTH 2 DAYS Performed at [facility], [place] [address] [place], [place] [address]    Report Status PENDING  Incomplete  MRSA PCR Screening     Status: None   Collection Time: 12/11/17  5:36 AM  Result Value Ref Range Status   MRSA by PCR NEGATIVE NEGATIVE Final    Comment:        The GeneXpert MRSA Assay (FDA approved for NASAL specimens only), is one component of a comprehensive MRSA colonization surveillance program. It is not intended to diagnose MRSA infection nor to guide or monitor treatment for MRSA infections. Performed at [facility], [place] [address] [place], [place] [address]     Radiology Reports Dg Chest Portable 1 View  Result Date: 12/09/2017 CLINICAL DATA:  Sepsis EXAM: PORTABLE CHEST 1 VIEW COMPARISON:  Chest x-rays dated 11/08/2017 and 11/05/2017. FINDINGS: Heart size and mediastinal contours are stable. Tracheostomy tube appears appropriately positioned in the midline. There is chronic central pulmonary vascular congestion. Linear opacity within the LEFT mid lung region is compatible with atelectasis. No new confluent opacity to suggest a developing pneumonia. No pleural effusion or pneumothorax seen. No acute or suspicious osseous finding. IMPRESSION: 1. No evidence of pneumonia or pulmonary edema. 2. Chronic mild central pulmonary vascular congestion. Electronically Signed   By: Franki Cabot M.D.   On: 12/09/2017 07:21    Lab Data:  CBC: Recent Labs  Lab 12/09/17 0658 12/10/17 0304 12/10/17 2313 12/11/17 0322 12/12/17 0425  WBC 27.9* 22.4*  --  23.3* 21.9*  NEUTROABS 22.0*  --   --   --   --   HGB 8.7* 6.6* 9.9* 10.1* 9.6*  HCT 30.0* 22.8* 32.4* 33.4* 31.4*  MCV 104.9* 106.0*  --   96.5 96.9  PLT 291 208  --  243 696   Basic Metabolic Panel: Recent Labs  Lab 12/09/17 1858 12/10/17 0304 12/11/17 0322 12/11/17 1111 12/11/17 2255 12/12/17 0425 12/12/17 1141  NA 150* 143 163* 163* 156* 156* 156*  K 3.8 3.0* 2.6*  --  5.1 2.7* 2.9*  CL 116* 115* 127*  --  126* 129*  --   CO2 20* 18* 21*  --  17* 18*  --   GLUCOSE 524* 237* 156*  --  225* 177*  --   BUN 185* 160* 168*  --  133* 124*  --   CREATININE 2.36* 2.03* 1.80*  --  1.39* 1.35*  --   CALCIUM 8.1* 7.1* 9.1  --  8.7* 8.5*  --   MG  --   --   --   --   --  2.3  --    GFR: Estimated Creatinine Clearance: 44.2 mL/min (A) (by C-G formula based on SCr of 1.35 mg/dL (H)). Liver Function Tests: Recent Labs  Lab 12/09/17 0658 12/10/17 0304 12/11/17 0322 12/12/17 0425  AST 398* 163* 174* 166*  ALT 157* 98* 106* 97*  ALKPHOS 57 44 63 82  BILITOT 0.5 0.6 0.6 0.4  PROT 7.8 6.0* 7.4 7.0  ALBUMIN 1.7* 1.3* 1.5* 1.4*   No results  for input(s): LIPASE, AMYLASE in the last 168 hours. No results for input(s): AMMONIA in the last 168 hours. Coagulation Profile: Recent Labs  Lab 12/09/17 0658 12/09/17 1650  INR 1.55 1.65   Cardiac Enzymes: No results for input(s): CKTOTAL, CKMB, CKMBINDEX, TROPONINI in the last 168 hours. BNP (last 3 results) No results for input(s): PROBNP in the last 8760 hours. HbA1C: Recent Labs    12/09/17 1650  HGBA1C 8.2*   CBG: Recent Labs  Lab 12/11/17 2007 12/11/17 2341 12/12/17 0408 12/12/17 0759 12/12/17 1133  GLUCAP 205* 188* 163* 171* 160*   Lipid Profile: No results for input(s): CHOL, HDL, LDLCALC, TRIG, CHOLHDL, LDLDIRECT in the last 72 hours. Thyroid Function Tests: No results for input(s): TSH, T4TOTAL, FREET4, T3FREE, THYROIDAB in the last 72 hours. Anemia Panel: No results for input(s): VITAMINB12, FOLATE, FERRITIN, TIBC, IRON, RETICCTPCT in the last 72 hours. Urine analysis:    Component Value Date/Time   COLORURINE AMBER (A) 12/09/2017 0758    APPEARANCEUR CLOUDY (A) 12/09/2017 0758   LABSPEC 1.016 12/09/2017 0758   PHURINE 9.0 (H) 12/09/2017 0758   GLUCOSEU 50 (A) 12/09/2017 0758   GLUCOSEU > 1000 mg/dL (A) 11/01/2007 2056   HGBUR NEGATIVE 12/09/2017 0758   HGBUR negative 07/15/2007 0954   BILIRUBINUR NEGATIVE 12/09/2017 0758   KETONESUR NEGATIVE 12/09/2017 0758   PROTEINUR >=300 (A) 12/09/2017 0758   UROBILINOGEN 1.0 04/09/2012 1524   NITRITE NEGATIVE 12/09/2017 0758   LEUKOCYTESUR LARGE (A) 12/09/2017 0758     Ripudeep Rai M.D. [facility] 12/12/2017, 1:54 PM  Pager: [phone] Between 7am to 7pm - call Pager - [phone]  After 7pm go to [email or web address] - password [email or web address]  Call night coverage person covering after 7pm

## 2017-12-13 DIAGNOSIS — E118 Type 2 diabetes mellitus with unspecified complications: Secondary | ICD-10-CM

## 2017-12-13 DIAGNOSIS — E1165 Type 2 diabetes mellitus with hyperglycemia: Secondary | ICD-10-CM

## 2017-12-13 LAB — COMPREHENSIVE METABOLIC PANEL
ALBUMIN: 1.4 g/dL — AB (ref 3.5–5.0)
ALK PHOS: 89 U/L (ref 38–126)
ALT: 91 U/L — AB (ref 14–54)
ANION GAP: 14 (ref 5–15)
AST: 157 U/L — AB (ref 15–41)
BILIRUBIN TOTAL: 0.4 mg/dL (ref 0.3–1.2)
BUN: 98 mg/dL — AB (ref 6–20)
CALCIUM: 8.3 mg/dL — AB (ref 8.9–10.3)
CO2: 15 mmol/L — AB (ref 22–32)
Chloride: 119 mmol/L — ABNORMAL HIGH (ref 101–111)
Creatinine, Ser: 1.16 mg/dL — ABNORMAL HIGH (ref 0.44–1.00)
GFR calc Af Amer: 58 mL/min — ABNORMAL LOW (ref >60)
GFR calc non Af Amer: 50 mL/min — ABNORMAL LOW (ref >60)
GLUCOSE: 231 mg/dL — AB (ref 65–99)
Potassium: 3.9 mmol/L (ref 3.5–5.1)
SODIUM: 148 mmol/L — AB (ref 135–145)
TOTAL PROTEIN: 6.3 g/dL — AB (ref 6.5–8.1)

## 2017-12-13 LAB — GLUCOSE, CAPILLARY
GLUCOSE-CAPILLARY: 218 mg/dL — AB (ref 65–99)
GLUCOSE-CAPILLARY: 130 mg/dL — AB (ref 65–99)
GLUCOSE-CAPILLARY: 123 mg/dL — AB (ref 65–99)
Glucose-Capillary: 204 mg/dL — ABNORMAL HIGH (ref 65–99)
Glucose-Capillary: 161 mg/dL — ABNORMAL HIGH (ref 65–99)

## 2017-12-13 LAB — SODIUM
SODIUM: 147 mmol/L — AB (ref 135–145)
Sodium: 146 mmol/L — ABNORMAL HIGH (ref 135–145)
Sodium: 143 mmol/L (ref 135–145)

## 2017-12-13 LAB — CBC
HEMATOCRIT: 29.1 % — AB (ref 36.0–46.0)
HEMOGLOBIN: 9.1 g/dL — AB (ref 12.0–15.0)
MCH: 29.3 pg (ref 26.0–34.0)
MCHC: 31.3 g/dL (ref 30.0–36.0)
MCV: 93.6 fL (ref 78.0–100.0)
Platelets: 189 10*3/uL (ref 150–400)
RBC: 3.11 MIL/uL — AB (ref 3.87–5.11)
RDW: 17.9 % — ABNORMAL HIGH (ref 11.5–15.5)
WBC: 21.8 10*3/uL — AB (ref 4.0–10.5)

## 2017-12-13 MED ORDER — VANCOMYCIN HCL 10 G IV SOLR
1250.0000 mg | Freq: Once | INTRAVENOUS | Status: AC
  Administered 2017-12-13: 1250 mg via INTRAVENOUS
  Filled 2017-12-13: qty 1250

## 2017-12-13 MED ORDER — VANCOMYCIN HCL IN DEXTROSE 750-5 MG/150ML-% IV SOLN
750.0000 mg | Freq: Two times a day (BID) | INTRAVENOUS | Status: DC
  Administered 2017-12-14 – 2017-12-16 (×5): 750 mg via INTRAVENOUS
  Filled 2017-12-13 (×6): qty 150

## 2017-12-13 MED ORDER — CEFEPIME HCL 1 G IJ SOLR
1.0000 g | Freq: Three times a day (TID) | INTRAMUSCULAR | Status: DC
  Administered 2017-12-13 – 2017-12-16 (×9): 1 g via INTRAVENOUS
  Filled 2017-12-13 (×10): qty 1

## 2017-12-13 MED ORDER — ACETAMINOPHEN 325 MG PO TABS
650.0000 mg | ORAL_TABLET | Freq: Three times a day (TID) | ORAL | Status: DC
  Administered 2017-12-13 – 2017-12-15 (×6): 650 mg via ORAL
  Filled 2017-12-13 (×7): qty 2

## 2017-12-13 MED ORDER — POTASSIUM CL IN DEXTROSE 5% 20 MEQ/L IV SOLN
20.0000 meq | INTRAVENOUS | Status: DC
  Administered 2017-12-13 – 2017-12-16 (×5): 20 meq via INTRAVENOUS
  Filled 2017-12-13 (×6): qty 1000

## 2017-12-13 MED ORDER — ACETAMINOPHEN 325 MG PO TABS
650.0000 mg | ORAL_TABLET | Freq: Three times a day (TID) | ORAL | Status: DC
  Administered 2017-12-13: 650 mg via ORAL
  Filled 2017-12-13: qty 2

## 2017-12-13 MED ORDER — INSULIN GLARGINE 100 UNIT/ML ~~LOC~~ SOLN
45.0000 [IU] | Freq: Two times a day (BID) | SUBCUTANEOUS | Status: DC
  Administered 2017-12-13 – 2017-12-16 (×6): 45 [IU] via SUBCUTANEOUS
  Filled 2017-12-13 (×6): qty 0.45

## 2017-12-13 NOTE — Progress Notes (Signed)
Clinical Social Worker following pateint for support. CSW has contacted [facility] (where patient is from) to see if there is any other contact information for patient family members. [facility] [facility] stated at this time they do not have any new numbers for patient emergency contact and family has been contacted several time to make aware of patients conditions.   Rhea Pink, MSW,  [place]

## 2017-12-13 NOTE — Progress Notes (Addendum)
[facility]                                                                              Patient Demographics  [name], is a 62 y.o. female, DOB - June 05, 1956, RWE:[id number]  Admit date - 12/09/2017   Admitting Physician Lady Deutscher, MD  Outpatient Primary MD for the patient is Nolene Ebbs, MD  Outpatient specialists:   LOS - 4  days   Medical records reviewed and are as summarized below:    Chief Complaint  Patient presents with  . Fever       Brief summary   Patient is a 62 year old female with a history of watershed infarcts, status epilepticus, hypoxic respiratory failure, status post trach, PEG tube, polysubstance abuse (cocaine, tobacco, alcohol), hypertension, hyperlipidemia, uncontrolled diabetes, RUE DVT, multiple hospitalization, has been in a persistent vegetative state was brought from her skilled nursing facility with a fever of 103 F, found to have sepsis presumed secondary to sacral decubitus ulcer, right lower leg ulcer.  Patient was also seen in the hospital 4/29-5/10 for anemia requiring blood transfusions after previous episode of hemorrhagic shock related to abdominal wall hematoma   Assessment & Plan    Principal Problem:   Sepsis ([place]) with Proteus/Enterobacter bacteremia, presumed secondary to sacral decub ulcer, right lower extremity ulcer present prior to admission -Leukocytosis improving, wound care following closely for dressings, will need debridement for ultimate healing and resolution -Continue IV antibiotics, goals of care pending, unable to contact daughter - added vancomycin, change to cefepime, continues to spike fevers  Active Problems:    Type II diabetes mellitus with renal manifestations (HCC) -Hemoglobin A1c 8.2, poorly controlled, continue tube feeds -Continue sliding scale insulin, increase Lantus to 45 units twice a day  Chronic respiratory failure with tracheostomy status ([place]) Continue to  monitor, O2, pulmonary hygiene and suctioning Respiratory therapy following for trach site maintenance.  Chest x-ray showed no pneumonia.  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   Disposition Plan: Awaiting further plan from the family, palliative  Time Spent in minutes 25  minutes  Procedures:  None  Consultants:   Palliative med  Antimicrobials:      Medications  Scheduled Meds: . amLODipine  10 mg Per Tube Daily  . cloNIDine  0.2 mg Per Tube TID  . feeding supplement (PRO-STAT SUGAR FREE 64)  30 mL Per Tube TID  . folic acid  1 mg Oral Daily  . free water  400 mL Per Tube Q2H  . hydrALAZINE  100 mg Per Tube Q8H  . insulin aspart  0-20 Units Subcutaneous Q4H  . insulin  glargine  43 Units Subcutaneous BID  . labetalol  400 mg Per Tube TID  . lacosamide  200 mg Per Tube BID  . levETIRAcetam  1,000 mg Per Tube BID  . multivitamin  15 mL Oral Daily  . nutrition supplement (JUVEN)  1 packet Per Tube BID BM  . pantoprazole sodium  40 mg Per Tube QHS  . phenytoin  250 mg Per Tube BID  . scopolamine  1 patch Transdermal Q72H  . sodium hypochlorite   Irrigation TID  . thiamine  100 mg Per Tube Daily  . topiramate  200 mg Per Tube BID   Continuous Infusions: . cefTRIAXone (ROCEPHIN)  IV 2 g (12/13/17 1216)  . dextrose 5 % with KCl 20 mEq / L 20 mEq (12/13/17 0938)    . feeding supplement (JEVITY 1.2 CAL) 45 mL/hr at 12/12/17 0600   PRN Meds:.acetaminophen, albuterol, bisacodyl, docusate, HYDROcodone-acetaminophen, LORazepam, ondansetron **OR** ondansetron (ZOFRAN) IV, senna-docusate   Antibiotics   Anti-infectives (From admission, onward)   Start     Dose/Rate Route Frequency Ordered Stop   12/10/17 1200  cefTRIAXone (ROCEPHIN) 2 g in sodium chloride 0.9 % 100 mL IVPB     2 g 200 mL/hr over 30 Minutes Intravenous Every 24 hours 12/10/17 0946     12/10/17 0900  vancomycin (VANCOCIN) IVPB 1000 mg/200 mL premix  Status:  Discontinued     1,000 mg 200 mL/hr over 60 Minutes Intravenous Every 24 hours 12/09/17 0816 12/10/17 0946   12/09/17 1400  piperacillin-tazobactam (ZOSYN) IVPB 3.375 g  Status:  Discontinued     3.375 g 12.5 mL/hr over 240 Minutes Intravenous Every 8 hours 12/09/17 0816 12/10/17 0946   12/09/17 0715  piperacillin-tazobactam (ZOSYN) IVPB 3.375 g     3.375 g 100 mL/hr over 30 Minutes Intravenous  Once 12/09/17 0704 12/09/17 0912   12/09/17 0715  vancomycin (VANCOCIN) IVPB 1000 mg/200 mL premix  Status:  Discontinued     1,000 mg 200 mL/hr over 60 Minutes Intravenous  Once 12/09/17 0704 12/09/17 0706   12/09/17 0715  vancomycin (VANCOCIN) 1,500 mg in sodium chloride 0.9 % 500 mL IVPB     1,500 mg 250 mL/hr over 120 Minutes Intravenous  Once 12/09/17 0706 12/09/17 1215        Subjective:   [name] was seen and examined today.  Having more secretions, tachypnea due to secretions.  Nonverbal, vegetative state, spiking temp 101.8 F.    Objective:   Vitals:   12/13/17 0441 12/13/17 0744 12/13/17 0824 12/13/17 1123  BP:  139/69    Pulse: 95 92 93 90  Resp: (!) 24 (!) 22 (!) 30 (!) 34  Temp:  (!) 100.9 F (38.3 C) (!) 100.8 F (38.2 C) (!) 101.8 F (38.8 C)  TempSrc:  Bladder    SpO2:  100% 100% 100%  Weight:      Height:        Intake/Output Summary (Last 24 hours) at 12/13/2017 1243 Last data filed at  12/13/2017 1017 Gross per 24 hour  Intake 1811.25 ml  Output 2725 ml  Net -913.75 ml     Wt Readings from Last 3 Encounters:  11/16/17 83.6 kg (184 lb 4.9 oz)  10/18/17 80.3 kg (177 lb)  09/14/17 82.1 kg (181 lb)     Exam   General: Nonverbal with eyes open, does not follow commands  Eyes:   HEENT: trach  Cardiovascular: S1 S2 auscultated, Regular rate and rhythm. No pedal edema b/l  Respiratory:  Diffuse right  Gastrointestinal:  nontender, nondistended, + bowel sounds+ PEG  Ext: no pedal edema bilaterally  Neuro: does not follow commands  Musculoskeletal: No digital cyanosis, clubbing  Skin: No rashes  Psych: does not follow commands, nonverbal    Data Reviewed:  I have personally reviewed following labs and imaging studies  Micro Results Recent Results (from the past 240 hour(s))  Culture, blood (Routine x 2)     Status: Abnormal   Collection Time: 12/09/17  7:00 AM  Result Value Ref Range Status   Specimen Description BLOOD RIGHT HAND  Final   Special Requests   Final    BOTTLES DRAWN AEROBIC AND ANAEROBIC Blood Culture adequate volume   Culture  Setup Time   Final    GRAM NEGATIVE RODS AEROBIC BOTTLE ONLY CRITICAL RESULT CALLED TO, READ BACK BY AND VERIFIED WITH: E MARTIN,PHARMD AT 1610 12/10/17 BY L BENFIELD Performed at [facility], [address], [place], [place] [address]    Culture PROTEUS MIRABILIS (A)  Final   Report Status 12/12/2017 FINAL  Final   Organism ID, Bacteria PROTEUS MIRABILIS  Final      Susceptibility   Proteus mirabilis - MIC*    AMPICILLIN <=2 SENSITIVE Sensitive     CEFAZOLIN <=4 SENSITIVE Sensitive     CEFEPIME <=1 SENSITIVE Sensitive     CEFTAZIDIME <=1 SENSITIVE Sensitive     CEFTRIAXONE <=1 SENSITIVE Sensitive     CIPROFLOXACIN >=4 RESISTANT Resistant     GENTAMICIN <=1 SENSITIVE Sensitive     IMIPENEM 2 SENSITIVE Sensitive     TRIMETH/SULFA <=20 SENSITIVE Sensitive     AMPICILLIN/SULBACTAM <=2 SENSITIVE  Sensitive     PIP/TAZO <=4 SENSITIVE Sensitive     * PROTEUS MIRABILIS  Blood Culture ID Panel (Reflexed)     Status: Abnormal   Collection Time: 12/09/17  7:00 AM  Result Value Ref Range Status   Enterococcus species NOT DETECTED NOT DETECTED Final   Listeria monocytogenes NOT DETECTED NOT DETECTED Final   Staphylococcus species NOT DETECTED NOT DETECTED Final   Staphylococcus aureus NOT DETECTED NOT DETECTED Final   Streptococcus species NOT DETECTED NOT DETECTED Final   Streptococcus agalactiae NOT DETECTED NOT DETECTED Final   Streptococcus pneumoniae NOT DETECTED NOT DETECTED Final   Streptococcus pyogenes NOT DETECTED NOT DETECTED Final   Acinetobacter baumannii NOT DETECTED NOT DETECTED Final   Enterobacteriaceae species DETECTED (A) NOT DETECTED Final    Comment: Enterobacteriaceae represent a large family of gram-negative bacteria, not a single organism. CRITICAL RESULT CALLED TO, READ BACK BY AND VERIFIED WITH: E MARTIN,PHARMD AT 4098 12/10/17 BY L BENFIELD    Enterobacter cloacae complex NOT DETECTED NOT DETECTED Final   Escherichia coli NOT DETECTED NOT DETECTED Final   Klebsiella oxytoca NOT DETECTED NOT DETECTED Final   Klebsiella pneumoniae NOT DETECTED NOT DETECTED Final   Proteus species DETECTED (A) NOT DETECTED Final    Comment: CRITICAL RESULT CALLED TO, READ BACK BY AND VERIFIED WITH: E MARTIN,PHARMD AT 1191 12/10/17 BY L BENFIELD    Serratia marcescens NOT DETECTED NOT DETECTED Final   Carbapenem resistance NOT DETECTED NOT DETECTED Final   Haemophilus influenzae NOT DETECTED NOT DETECTED Final   Neisseria meningitidis NOT DETECTED NOT DETECTED Final   Pseudomonas aeruginosa NOT DETECTED NOT DETECTED Final   Candida albicans NOT DETECTED NOT DETECTED Final   Candida glabrata NOT DETECTED NOT DETECTED Final   Candida krusei NOT DETECTED NOT DETECTED Final   Candida parapsilosis NOT DETECTED NOT  DETECTED Final   Candida tropicalis NOT DETECTED NOT DETECTED Final     Comment: Performed at [facility], [place] [address], [place], [place] [address]  Culture, blood (Routine x 2)     Status: None (Preliminary result)   Collection Time: 12/09/17  6:58 PM  Result Value Ref Range Status   Specimen Description BLOOD BLOOD LEFT WRIST  Final   Special Requests   Final    BOTTLES DRAWN AEROBIC ONLY Blood Culture adequate volume   Culture   Final    NO GROWTH 3 DAYS Performed at [facility], [place] [address], [place], [place] [address]    Report Status PENDING  Incomplete  MRSA PCR Screening     Status: None   Collection Time: 12/11/17  5:36 AM  Result Value Ref Range Status   MRSA by PCR NEGATIVE NEGATIVE Final    Comment:        The GeneXpert MRSA Assay (FDA approved for NASAL specimens only), is one component of a comprehensive MRSA colonization surveillance program. It is not intended to diagnose MRSA infection nor to guide or monitor treatment for MRSA infections. Performed at [facility], [place] [address], [place], [place] [address]     Radiology Reports Dg Chest Portable 1 View  Result Date: 12/09/2017 CLINICAL DATA:  Sepsis EXAM: PORTABLE CHEST 1 VIEW COMPARISON:  Chest x-rays dated 11/08/2017 and 11/05/2017. FINDINGS: Heart size and mediastinal contours are stable. Tracheostomy tube appears appropriately positioned in the midline. There is chronic central pulmonary vascular congestion. Linear opacity within the LEFT mid lung region is compatible with atelectasis. No new confluent opacity to suggest a developing pneumonia. No pleural effusion or pneumothorax seen. No acute or suspicious osseous finding. IMPRESSION: 1. No evidence of pneumonia or pulmonary edema. 2. Chronic mild central pulmonary vascular congestion. Electronically Signed   By: Franki Cabot M.D.   On: 12/09/2017 07:21    Lab Data:  CBC: Recent Labs  Lab 12/09/17 0658 12/10/17 0304 12/10/17 2313 12/11/17 0322 12/12/17 0425 12/13/17 0448  WBC 27.9* 22.4*   --  23.3* 21.9* 21.8*  NEUTROABS 22.0*  --   --   --   --   --   HGB 8.7* 6.6* 9.9* 10.1* 9.6* 9.1*  HCT 30.0* 22.8* 32.4* 33.4* 31.4* 29.1*  MCV 104.9* 106.0*  --  96.5 96.9 93.6  PLT 291 208  --  243 238 833   Basic Metabolic Panel: Recent Labs  Lab 12/10/17 0304 12/11/17 0322  12/11/17 2255 12/12/17 0425 12/12/17 1141 12/12/17 1657 12/12/17 2306 12/13/17 0448 12/13/17 1034  NA 143 163*   < > 156* 156* 156* 153* 147* 148* 146*  K 3.0* 2.6*  --  5.1 2.7* 2.9*  --   --  3.9  --   CL 115* 127*  --  126* 129*  --   --   --  119*  --   CO2 18* 21*  --  17* 18*  --   --   --  15*  --   GLUCOSE 237* 156*  --  225* 177*  --   --   --  231*  --   BUN 160* 168*  --  133* 124*  --   --   --  98*  --   CREATININE 2.03* 1.80*  --  1.39* 1.35*  --   --   --  1.16*  --   CALCIUM 7.1* 9.1  --  8.7* 8.5*  --   --   --  8.3*  --   MG  --   --   --   --  2.3  --   --   --   --   --    < > = values in this interval not displayed.   GFR: Estimated Creatinine Clearance: 51.5 mL/min (A) (by C-G formula based on SCr of 1.16 mg/dL (H)). Liver Function Tests: Recent Labs  Lab 12/09/17 0658 12/10/17 0304 12/11/17 0322 12/12/17 0425 12/13/17 0448  AST 398* 163* 174* 166* 157*  ALT 157* 98* 106* 97* 91*  ALKPHOS 57 44 63 82 89  BILITOT 0.5 0.6 0.6 0.4 0.4  PROT 7.8 6.0* 7.4 7.0 6.3*  ALBUMIN 1.7* 1.3* 1.5* 1.4* 1.4*   No results for input(s): LIPASE, AMYLASE in the last 168 hours. No results for input(s): AMMONIA in the last 168 hours. Coagulation Profile: Recent Labs  Lab 12/09/17 0658 12/09/17 1650  INR 1.55 1.65   Cardiac Enzymes: No results for input(s): CKTOTAL, CKMB, CKMBINDEX, TROPONINI in the last 168 hours. BNP (last 3 results) No results for input(s): PROBNP in the last 8760 hours. HbA1C: No results for input(s): HGBA1C in the last 72 hours. CBG: Recent Labs  Lab 12/12/17 2031 12/12/17 2315 12/13/17 0416 12/13/17 0812 12/13/17 1141  GLUCAP 187* 187* 218* 204* 161*    Lipid Profile: No results for input(s): CHOL, HDL, LDLCALC, TRIG, CHOLHDL, LDLDIRECT in the last 72 hours. Thyroid Function Tests: No results for input(s): TSH, T4TOTAL, FREET4, T3FREE, THYROIDAB in the last 72 hours. Anemia Panel: No results for input(s): VITAMINB12, FOLATE, FERRITIN, TIBC, IRON, RETICCTPCT in the last 72 hours. Urine analysis:    Component Value Date/Time   COLORURINE AMBER (A) 12/09/2017 0758   APPEARANCEUR CLOUDY (A) 12/09/2017 0758   LABSPEC 1.016 12/09/2017 0758   PHURINE 9.0 (H) 12/09/2017 0758   GLUCOSEU 50 (A) 12/09/2017 0758   GLUCOSEU > 1000 mg/dL (A) 11/01/2007 2056   HGBUR NEGATIVE 12/09/2017 0758   HGBUR negative 07/15/2007 0954   BILIRUBINUR NEGATIVE 12/09/2017 0758   KETONESUR NEGATIVE 12/09/2017 0758   PROTEINUR >=300 (A) 12/09/2017 0758   UROBILINOGEN 1.0 04/09/2012 1524   NITRITE NEGATIVE 12/09/2017 0758   LEUKOCYTESUR LARGE (A) 12/09/2017 0758     Helmut Hennon M.D. [facility] 12/13/2017, 12:43 PM  Pager: [phone] Between 7am to 7pm - call Pager - [phone]  After 7pm go to [email or web address] - password [email or web address]  Call night coverage person covering after 7pm

## 2017-12-13 NOTE — Progress Notes (Signed)
Pharmacy Antibiotic Note  [name] is a 62 y.o. female admitted on 12/09/2017 with sepsis.  Pharmacy has been consulted for Vancomycin and Cefepime dosing. Note patient has been on Ceftriaxone for Proteus bacteremia however patient continues to spike fevers and leukocytosis has been worsening therefore, re-broadening antibiotics.   SCr is improving with estimated CrCl ~ 51.5 mL/min.  WBC down every so slightly today at 21.8. New fever up to 101.   Plan: Discontinue Ceftriaxone. Vancomycin 1250mg  IV x1 then 750mg  IV every 12 hours.  Cefepime 1g IV every 8 hours.  Monitor renal function, any new cultures, and clinical status.    Height: 5\' 4"  (162.6 cm) Weight: (bed scale broken) IBW/kg (Calculated) : 54.7  Temp (24hrs), Avg:101.6 F (38.7 C), Min:100.8 F (38.2 C), Max:102 F (38.9 C)  Recent Labs  Lab 12/09/17 0658 12/09/17 0705  12/10/17 0304 12/11/17 0322 12/11/17 2255 12/12/17 0425 12/13/17 0448  WBC 27.9*  --   --  22.4* 23.3*  --  21.9* 21.8*  CREATININE 2.49*  --    < > 2.03* 1.80* 1.39* 1.35* 1.16*  LATICACIDVEN  --  1.55  --   --   --   --   --   --    < > = values in this interval not displayed.    Estimated Creatinine Clearance: 51.5 mL/min (A) (by C-G formula based on SCr of 1.16 mg/dL (H)).    No Known Allergies  Antimicrobials this admission: 6/3 CTX>>6/6 6/2 Vanc >> 6/3; 6/6 >> 6/2 Zosyn >>6/3 6/6 Cefepime >>  Dose adjustments this admission:   Microbiology results: 6/2: blood x2  proteus 1/2 (BCID showing Proteus in 1 of 2 BCx) 6/4: MRSA pcr negative  Thank you for allowing pharmacy to be a part of this patient's care.  Sloan Leiter, PharmD, BCPS, BCCCP Clinical Pharmacist Clinical phone 12/13/2017 until 3:[phone] After hours, please call #[phone] 12/13/2017 1:20 PM

## 2017-12-13 NOTE — Progress Notes (Signed)
Bed will not weight properly. Will talk with dayshift RN about ordering a new bed. Will continue to follow.

## 2017-12-13 NOTE — Progress Notes (Signed)
Palliative care unsuccessful at contacting the patient's daughter. Once social work has documented due diligence in locating [facility] or surrogate decision maker, we should transition this patient to full comfort care.  Lane Hacker, DO Palliative Medicine [phone]

## 2017-12-14 LAB — CULTURE, BLOOD (ROUTINE X 2)
CULTURE: NO GROWTH
SPECIAL REQUESTS: ADEQUATE

## 2017-12-14 LAB — GLUCOSE, CAPILLARY
GLUCOSE-CAPILLARY: 163 mg/dL — AB (ref 65–99)
Glucose-Capillary: 162 mg/dL — ABNORMAL HIGH (ref 65–99)
Glucose-Capillary: 172 mg/dL — ABNORMAL HIGH (ref 65–99)
Glucose-Capillary: 178 mg/dL — ABNORMAL HIGH (ref 65–99)
Glucose-Capillary: 191 mg/dL — ABNORMAL HIGH (ref 65–99)
Glucose-Capillary: 196 mg/dL — ABNORMAL HIGH (ref 65–99)

## 2017-12-14 LAB — SODIUM
SODIUM: 141 mmol/L (ref 135–145)
SODIUM: 144 mmol/L (ref 135–145)
Sodium: 148 mmol/L — ABNORMAL HIGH (ref 135–145)
Sodium: 140 mmol/L (ref 135–145)
Sodium: 143 mmol/L (ref 135–145)

## 2017-12-14 MED ORDER — LACOSAMIDE 200 MG PO TABS
200.0000 mg | ORAL_TABLET | Freq: Two times a day (BID) | ORAL | Status: DC
  Administered 2017-12-14 – 2017-12-18 (×9): 200 mg via ORAL
  Filled 2017-12-14 (×5): qty 1
  Filled 2017-12-14 (×2): qty 4
  Filled 2017-12-14: qty 1

## 2017-12-14 NOTE — Progress Notes (Signed)
[facility]                                                                              Patient Demographics  [name], is a 62 y.o. female, DOB - [birth year], JHE:[id number]  Admit date - 12/09/2017   Admitting Physician Lady Deutscher, MD  Outpatient Primary MD for the patient is Nolene Ebbs, MD  Outpatient specialists:   LOS - 5  days   Medical records reviewed and are as summarized below:    Chief Complaint  Patient presents with  . Fever       Brief summary   Patient is a 62 year old female with a history of watershed infarcts, status epilepticus, hypoxic respiratory failure, status post trach, PEG tube, polysubstance abuse (cocaine, tobacco, alcohol), hypertension, hyperlipidemia, uncontrolled diabetes, RUE DVT, multiple hospitalization, has been in a persistent vegetative state was brought from her skilled nursing facility with a fever of 103 F, found to have sepsis presumed secondary to sacral decubitus ulcer, right lower leg ulcer.  Patient was also seen in the hospital 4/29-5/10 for anemia requiring blood transfusions after previous episode of hemorrhagic shock related to abdominal wall hematoma   Assessment & Plan    Principal Problem:   Sepsis ([place]) with Proteus/Enterobacter bacteremia, presumed secondary to sacral decub ulcer, right lower extremity ulcer present prior to admission -Leukocytosis improving, wound care following closely for dressings, will need debridement for ultimate healing and resolution -Antibiotics changed to IV vancomycin and cefepime on 6/6, due to persistent fevers, still continues to spike low-grade fevers  Active Problems:    Type II diabetes mellitus with renal manifestations (HCC) -Hemoglobin A1c 8.2, poorly controlled, continue tube feeds -CBGs better, continue Lantus 45 units twice a day, continue sliding scale   Chronic respiratory failure with tracheostomy status ([place]) Continue to monitor, O2,  pulmonary hygiene and suctioning Respiratory therapy following for trach site maintenance.  Chest x-ray showed no pneumonia.  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  Code Status: DNR DVT Prophylaxis:   Family Communication: No family member at the bedside.   Disposition Plan: Awaiting further plan from the family, palliative.  Time Spent in minutes 15-minute  Procedures:  None  Consultants:   Palliative med  Antimicrobials:   IV vancomycin 6/6  IV cefepime 6/6   Medications  Scheduled Meds: . acetaminophen  650 mg Oral Q8H  . amLODipine  10 mg Per Tube Daily  . cloNIDine  0.2 mg Per Tube TID  . feeding supplement (PRO-STAT SUGAR FREE 64)  30 mL Per Tube TID  . folic acid  1 mg Oral Daily  . free water  400 mL Per Tube Q2H  .  hydrALAZINE  100 mg Per Tube Q8H  . insulin aspart  0-20 Units Subcutaneous Q4H  . insulin glargine  45 Units Subcutaneous BID  . labetalol  400 mg Per Tube TID  . lacosamide  200 mg Oral BID  . levETIRAcetam  1,000 mg Per Tube BID  . multivitamin  15 mL Oral Daily  . nutrition supplement (JUVEN)  1 packet Per Tube BID BM  . pantoprazole sodium  40 mg Per Tube QHS  . phenytoin  250 mg Per Tube BID  . scopolamine  1 patch Transdermal Q72H  . thiamine  100 mg Per Tube Daily   Continuous Infusions: . ceFEPime (MAXIPIME) IV Stopped (12/14/17 1040)  . dextrose  5 % with KCl 20 mEq / L 75 mL/hr at 12/14/17 0700  . feeding supplement (JEVITY 1.2 CAL) 1,000 mL (12/13/17 1717)  . vancomycin Stopped (12/14/17 1400)   PRN Meds:.acetaminophen, albuterol, bisacodyl, docusate, HYDROcodone-acetaminophen, LORazepam, ondansetron **OR** ondansetron (ZOFRAN) IV, senna-docusate   Antibiotics   Anti-infectives (From admission, onward)   Start     Dose/Rate Route Frequency Ordered Stop   12/14/17 0130  vancomycin (VANCOCIN) IVPB 750 mg/150 ml premix     750 mg 150 mL/hr over 60 Minutes Intravenous Every 12 hours 12/13/17 1325     12/13/17 1600  ceFEPIme (MAXIPIME) 1 g in sodium chloride 0.9 % 100 mL IVPB     1 g 200 mL/hr over 30 Minutes Intravenous Every 8 hours 12/13/17 1325     12/13/17 1330  vancomycin (VANCOCIN) 1,250 mg in sodium chloride 0.9 % 250 mL IVPB     1,250 mg 166.7 mL/hr over 90 Minutes Intravenous  Once 12/13/17 1325 12/13/17 1634   12/10/17 1200  cefTRIAXone (ROCEPHIN) 2 g in sodium chloride 0.9 % 100 mL IVPB  Status:  Discontinued     2 g 200 mL/hr over 30 Minutes Intravenous Every 24 hours 12/10/17 0946 12/13/17 1325   12/10/17 0900  vancomycin (VANCOCIN) IVPB 1000 mg/200 mL premix  Status:  Discontinued     1,000 mg 200 mL/hr over 60 Minutes Intravenous Every 24 hours 12/09/17 0816 12/10/17 0946   12/09/17 1400  piperacillin-tazobactam (ZOSYN) IVPB 3.375 g  Status:  Discontinued     3.375 g 12.5 mL/hr over 240 Minutes Intravenous Every 8 hours 12/09/17 0816 12/10/17 0946   12/09/17 0715  piperacillin-tazobactam (ZOSYN) IVPB 3.375 g     3.375 g 100 mL/hr over 30 Minutes Intravenous  Once 12/09/17 0704 12/09/17 0912   12/09/17 0715  vancomycin (VANCOCIN) IVPB 1000 mg/200 mL premix  Status:  Discontinued     1,000 mg 200 mL/hr over 60 Minutes Intravenous  Once 12/09/17 0704 12/09/17 0706   12/09/17 0715  vancomycin (VANCOCIN) 1,500 mg in sodium chloride 0.9 % 500 mL IVPB     1,500 mg 250 mL/hr over 120 Minutes Intravenous  Once  12/09/17 0706 12/09/17 1215        Subjective:   Aanshi Pomerleau was seen and examined today.  Nonverbal, still spiking fevers, no meaningful recovery in the last few days  Objective:   Vitals:   12/14/17 0808 12/14/17 0841 12/14/17 1256 12/14/17 1443  BP: (!) 146/67  (!) 149/83   Pulse: 96 (!) 102 (!) 102 (!) 102  Resp: (!) 23 (!) 37 (!) 24 (!) 33  Temp: (!) 100.9 F (38.3 C) (!) 100.8 F (38.2 C) (!) 100.6 F (38.1 C) (!) 100.8 F (38.2 C)  TempSrc: Oral  Bladder   SpO2: 100% 99%  100% 100%  Weight:      Height:        Intake/Output Summary (Last 24 hours) at 12/14/2017 1557 Last data filed at 12/14/2017 1300 Gross per 24 hour  Intake 1150 ml  Output 6125 ml  Net -4975 ml     Wt Readings from Last 3 Encounters:  11/16/17 83.6 kg (184 lb 4.9 oz)  10/18/17 80.3 kg (177 lb)  09/14/17 82.1 kg (181 lb)     Exam   General: Nonverbal, eyes open, ill-appearing  Eyes:   HEENT:  Atraumatic, normocephalic,   Cardiovascular: S1 S2 auscultated, Regular rate and rhythm. No pedal edema b/l  Respiratory: Clear to auscultation bilaterally, no wheezing, rales or rhonchi  Gastrointestinal: Soft, nontender, nondistended, + bowel sounds  Ext: heel protectors  Neuro: does not follow any command  Musculoskeletal: No digital cyanosis, clubbing  Skin: No rashes  Psych: nonverbal    Data Reviewed:  I have personally reviewed following labs and imaging studies  Micro Results Recent Results (from the past 240 hour(s))  Culture, blood (Routine x 2)     Status: Abnormal   Collection Time: 12/09/17  7:00 AM  Result Value Ref Range Status   Specimen Description BLOOD RIGHT HAND  Final   Special Requests   Final    BOTTLES DRAWN AEROBIC AND ANAEROBIC Blood Culture adequate volume   Culture  Setup Time   Final    GRAM NEGATIVE RODS AEROBIC BOTTLE ONLY CRITICAL RESULT CALLED TO, READ BACK BY AND VERIFIED WITH: E MARTIN,PHARMD AT 3818 12/10/17 BY L BENFIELD Performed at  [facility], [address], [place], [place] [address]    Culture PROTEUS MIRABILIS (A)  Final   Report Status 12/12/2017 FINAL  Final   Organism ID, Bacteria PROTEUS MIRABILIS  Final      Susceptibility   Proteus mirabilis - MIC*    AMPICILLIN <=2 SENSITIVE Sensitive     CEFAZOLIN <=4 SENSITIVE Sensitive     CEFEPIME <=1 SENSITIVE Sensitive     CEFTAZIDIME <=1 SENSITIVE Sensitive     CEFTRIAXONE <=1 SENSITIVE Sensitive     CIPROFLOXACIN >=4 RESISTANT Resistant     GENTAMICIN <=1 SENSITIVE Sensitive     IMIPENEM 2 SENSITIVE Sensitive     TRIMETH/SULFA <=20 SENSITIVE Sensitive     AMPICILLIN/SULBACTAM <=2 SENSITIVE Sensitive     PIP/TAZO <=4 SENSITIVE Sensitive     * PROTEUS MIRABILIS  Blood Culture ID Panel (Reflexed)     Status: Abnormal   Collection Time: 12/09/17  7:00 AM  Result Value Ref Range Status   Enterococcus species NOT DETECTED NOT DETECTED Final   Listeria monocytogenes NOT DETECTED NOT DETECTED Final   Staphylococcus species NOT DETECTED NOT DETECTED Final   Staphylococcus aureus NOT DETECTED NOT DETECTED Final   Streptococcus species NOT DETECTED NOT DETECTED Final   Streptococcus agalactiae NOT DETECTED NOT DETECTED Final   Streptococcus pneumoniae NOT DETECTED NOT DETECTED Final   Streptococcus pyogenes NOT DETECTED NOT DETECTED Final   Acinetobacter baumannii NOT DETECTED NOT DETECTED Final   Enterobacteriaceae species DETECTED (A) NOT DETECTED Final    Comment: Enterobacteriaceae represent a large family of gram-negative bacteria, not a single organism. CRITICAL RESULT CALLED TO, READ BACK BY AND VERIFIED WITH: E MARTIN,PHARMD AT 1696 12/10/17 BY L BENFIELD    Enterobacter cloacae complex NOT DETECTED NOT DETECTED Final   Escherichia coli NOT DETECTED NOT DETECTED Final   Klebsiella oxytoca NOT DETECTED NOT DETECTED Final   Klebsiella pneumoniae NOT DETECTED  NOT DETECTED Final   Proteus species DETECTED (A) NOT DETECTED Final    Comment: CRITICAL  RESULT CALLED TO, READ BACK BY AND VERIFIED WITH: E MARTIN,PHARMD AT 8657 12/10/17 BY L BENFIELD    Serratia marcescens NOT DETECTED NOT DETECTED Final   Carbapenem resistance NOT DETECTED NOT DETECTED Final   Haemophilus influenzae NOT DETECTED NOT DETECTED Final   Neisseria meningitidis NOT DETECTED NOT DETECTED Final   Pseudomonas aeruginosa NOT DETECTED NOT DETECTED Final   Candida albicans NOT DETECTED NOT DETECTED Final   Candida glabrata NOT DETECTED NOT DETECTED Final   Candida krusei NOT DETECTED NOT DETECTED Final   Candida parapsilosis NOT DETECTED NOT DETECTED Final   Candida tropicalis NOT DETECTED NOT DETECTED Final    Comment: Performed at [facility], [place] [address] [place], [place] [address]  Culture, blood (Routine x 2)     Status: None   Collection Time: 12/09/17  6:58 PM  Result Value Ref Range Status   Specimen Description BLOOD BLOOD LEFT WRIST  Final   Special Requests   Final    BOTTLES DRAWN AEROBIC ONLY Blood Culture adequate volume   Culture   Final    NO GROWTH 5 DAYS Performed at [facility], [place] [address], [place], [place] [address]    Report Status 12/14/2017 FINAL  Final  MRSA PCR Screening     Status: None   Collection Time: 12/11/17  5:36 AM  Result Value Ref Range Status   MRSA by PCR NEGATIVE NEGATIVE Final    Comment:        The GeneXpert MRSA Assay (FDA approved for NASAL specimens only), is one component of a comprehensive MRSA colonization surveillance program. It is not intended to diagnose MRSA infection nor to guide or monitor treatment for MRSA infections. Performed at [facility], [place] [address] [place], [place] [address]     Radiology Reports Dg Chest Portable 1 View  Result Date: 12/09/2017 CLINICAL DATA:  Sepsis EXAM: PORTABLE CHEST 1 VIEW COMPARISON:  Chest x-rays dated 11/08/2017 and 11/05/2017. FINDINGS: Heart size and mediastinal contours are stable. Tracheostomy tube appears appropriately  positioned in the midline. There is chronic central pulmonary vascular congestion. Linear opacity within the LEFT mid lung region is compatible with atelectasis. No new confluent opacity to suggest a developing pneumonia. No pleural effusion or pneumothorax seen. No acute or suspicious osseous finding. IMPRESSION: 1. No evidence of pneumonia or pulmonary edema. 2. Chronic mild central pulmonary vascular congestion. Electronically Signed   By: Franki Cabot M.D.   On: 12/09/2017 07:21    Lab Data:  CBC: Recent Labs  Lab 12/09/17 0658 12/10/17 0304 12/10/17 2313 12/11/17 0322 12/12/17 0425 12/13/17 0448  WBC 27.9* 22.4*  --  23.3* 21.9* 21.8*  NEUTROABS 22.0*  --   --   --   --   --   HGB 8.7* 6.6* 9.9* 10.1* 9.6* 9.1*  HCT 30.0* 22.8* 32.4* 33.4* 31.4* 29.1*  MCV 104.9* 106.0*  --  96.5 96.9 93.6  PLT 291 208  --  243 238 401   Basic Metabolic Panel: Recent Labs  Lab 12/10/17 0304 12/11/17 0322  12/11/17 2255 12/12/17 0425 12/12/17 1141  12/13/17 0448 12/13/17 1034 12/13/17 1720 12/13/17 2224 12/14/17 0515 12/14/17 1047  NA 143 163*   < > 156* 156* 156*   < > 148* 146* 143 148* 141 144  K 3.0* 2.6*  --  5.1 2.7* 2.9*  --  3.9  --   --   --   --   --  CL 115* 127*  --  126* 129*  --   --  119*  --   --   --   --   --   CO2 18* 21*  --  17* 18*  --   --  15*  --   --   --   --   --   GLUCOSE 237* 156*  --  225* 177*  --   --  231*  --   --   --   --   --   BUN 160* 168*  --  133* 124*  --   --  98*  --   --   --   --   --   CREATININE 2.03* 1.80*  --  1.39* 1.35*  --   --  1.16*  --   --   --   --   --   CALCIUM 7.1* 9.1  --  8.7* 8.5*  --   --  8.3*  --   --   --   --   --   MG  --   --   --   --  2.3  --   --   --   --   --   --   --   --    < > = values in this interval not displayed.   GFR: Estimated Creatinine Clearance: 51.5 mL/min (A) (by C-G formula based on SCr of 1.16 mg/dL (H)). Liver Function Tests: Recent Labs  Lab 12/09/17 0658 12/10/17 0304  12/11/17 0322 12/12/17 0425 12/13/17 0448  AST 398* 163* 174* 166* 157*  ALT 157* 98* 106* 97* 91*  ALKPHOS 57 44 63 82 89  BILITOT 0.5 0.6 0.6 0.4 0.4  PROT 7.8 6.0* 7.4 7.0 6.3*  ALBUMIN 1.7* 1.3* 1.5* 1.4* 1.4*   No results for input(s): LIPASE, AMYLASE in the last 168 hours. No results for input(s): AMMONIA in the last 168 hours. Coagulation Profile: Recent Labs  Lab 12/09/17 0658 12/09/17 1650  INR 1.55 1.65   Cardiac Enzymes: No results for input(s): CKTOTAL, CKMB, CKMBINDEX, TROPONINI in the last 168 hours. BNP (last 3 results) No results for input(s): PROBNP in the last 8760 hours. HbA1C: No results for input(s): HGBA1C in the last 72 hours. CBG: Recent Labs  Lab 12/13/17 1945 12/14/17 0027 12/14/17 0402 12/14/17 0806 12/14/17 1201  GLUCAP 123* 196* 162* 172* 191*   Lipid Profile: No results for input(s): CHOL, HDL, LDLCALC, TRIG, CHOLHDL, LDLDIRECT in the last 72 hours. Thyroid Function Tests: No results for input(s): TSH, T4TOTAL, FREET4, T3FREE, THYROIDAB in the last 72 hours. Anemia Panel: No results for input(s): VITAMINB12, FOLATE, FERRITIN, TIBC, IRON, RETICCTPCT in the last 72 hours. Urine analysis:    Component Value Date/Time   COLORURINE AMBER (A) 12/09/2017 0758   APPEARANCEUR CLOUDY (A) 12/09/2017 0758   LABSPEC 1.016 12/09/2017 0758   PHURINE 9.0 (H) 12/09/2017 0758   GLUCOSEU 50 (A) 12/09/2017 0758   GLUCOSEU > 1000 mg/dL (A) 11/01/2007 2056   HGBUR NEGATIVE 12/09/2017 0758   HGBUR negative 07/15/2007 0954   BILIRUBINUR NEGATIVE 12/09/2017 0758   KETONESUR NEGATIVE 12/09/2017 0758   PROTEINUR >=300 (A) 12/09/2017 0758   UROBILINOGEN 1.0 04/09/2012 1524   NITRITE NEGATIVE 12/09/2017 0758   LEUKOCYTESUR LARGE (A) 12/09/2017 0758     Macklin Jacquin M.D. [facility] 12/14/2017, 3:57 PM  Pager: [phone] Between 7am to 7pm - call Pager - [phone]  After 7pm go to [email or web address] -  password [email or web address]  Call night coverage person  covering after 7pm

## 2017-12-15 LAB — BASIC METABOLIC PANEL
ANION GAP: 8 (ref 5–15)
BUN: 81 mg/dL — ABNORMAL HIGH (ref 6–20)
CO2: 16 mmol/L — ABNORMAL LOW (ref 22–32)
Calcium: 8.7 mg/dL — ABNORMAL LOW (ref 8.9–10.3)
Chloride: 117 mmol/L — ABNORMAL HIGH (ref 101–111)
Creatinine, Ser: 1.02 mg/dL — ABNORMAL HIGH (ref 0.44–1.00)
GFR calc Af Amer: >60 mL/min (ref >60)
GFR, EST NON AFRICAN AMERICAN: 58 mL/min — AB (ref >60)
Glucose, Bld: 283 mg/dL — ABNORMAL HIGH (ref 65–99)
POTASSIUM: 3.7 mmol/L (ref 3.5–5.1)
SODIUM: 141 mmol/L (ref 135–145)

## 2017-12-15 LAB — GLUCOSE, CAPILLARY
GLUCOSE-CAPILLARY: 194 mg/dL — AB (ref 65–99)
GLUCOSE-CAPILLARY: 202 mg/dL — AB (ref 65–99)
GLUCOSE-CAPILLARY: 215 mg/dL — AB (ref 65–99)
GLUCOSE-CAPILLARY: 227 mg/dL — AB (ref 65–99)
GLUCOSE-CAPILLARY: 249 mg/dL — AB (ref 65–99)
Glucose-Capillary: 196 mg/dL — ABNORMAL HIGH (ref 65–99)

## 2017-12-15 LAB — CBC
HCT: 30.1 % — ABNORMAL LOW (ref 36.0–46.0)
Hemoglobin: 9.1 g/dL — ABNORMAL LOW (ref 12.0–15.0)
MCH: 29.2 pg (ref 26.0–34.0)
MCHC: 30.2 g/dL (ref 30.0–36.0)
MCV: 96.5 fL (ref 78.0–100.0)
PLATELETS: 310 10*3/uL (ref 150–400)
RBC: 3.12 MIL/uL — AB (ref 3.87–5.11)
RDW: 16.7 % — ABNORMAL HIGH (ref 11.5–15.5)
WBC: 17.8 10*3/uL — AB (ref 4.0–10.5)

## 2017-12-15 LAB — SODIUM
SODIUM: 141 mmol/L (ref 135–145)
SODIUM: 141 mmol/L (ref 135–145)

## 2017-12-15 NOTE — Progress Notes (Signed)
[facility]                                                                              Patient Demographics  [name], is a 62 y.o. female, DOB - 1956/04/02, EQA:[id number]  Admit date - 12/09/2017   Admitting Physician [name] Deutscher, MD  Outpatient Primary MD for the patient is [name] Ebbs, MD  Outpatient specialists:   LOS - 6  days   Medical records reviewed and are as summarized below:    Chief Complaint  Patient presents with  . Fever       Brief summary   Patient is a 62 year old female with a history of watershed infarcts, status epilepticus, hypoxic respiratory failure, status post trach, PEG tube, polysubstance abuse (cocaine, tobacco, alcohol), hypertension, hyperlipidemia, uncontrolled diabetes, RUE DVT, multiple hospitalization, has been in a persistent vegetative state was brought from her skilled nursing facility with a fever of 103 F, found to have sepsis presumed secondary to sacral decubitus ulcer, right lower leg ulcer.  Patient was also seen in the hospital 4/29-5/10 for anemia requiring blood transfusions after previous episode of hemorrhagic shock related to abdominal wall hematoma   Assessment & Plan    Principal Problem:   Sepsis ([place]) with Proteus/Enterobacter bacteremia, presumed secondary to sacral decub ulcer, right lower extremity ulcer present prior to admission -Leukocytosis improving, wound care following closely for dressings, will need debridement for ultimate healing and resolution -Antibiotics changed to IV vancomycin and cefepime on 6/6, due to persistent fevers - leukocytosis improving, still has low-grade fevers  Active Problems:    Type II diabetes mellitus with renal manifestations (HCC) -Hemoglobin A1c 8.2, poorly controlled, continue tube feeds -Continue Lantus 45 units twice a day, sliding scale insulin   Chronic respiratory failure with tracheostomy status ([place]) Continue to monitor, O2,  pulmonary hygiene and suctioning Respiratory therapy following for trach site maintenance.  Chest x-ray showed no pneumonia.  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 Called patient's daughter, [name] again today, no response   Disposition Plan: Unclear  Time Spent in minutes 15 minutes  Procedures:  None  Consultants:   Palliative med  Antimicrobials:   IV vancomycin 6/6  IV cefepime 6/6   Medications  Scheduled Meds: . acetaminophen  650 mg Oral Q8H  . amLODipine  10 mg Per Tube Daily  . cloNIDine  0.2 mg Per Tube TID  . feeding supplement (PRO-STAT SUGAR FREE 64)  30 mL Per Tube TID  . folic acid  1 mg Oral Daily  . free water  400 mL Per Tube Q2H  .  hydrALAZINE  100 mg Per Tube Q8H  . insulin aspart  0-20 Units Subcutaneous Q4H  . insulin glargine  45 Units Subcutaneous BID  . labetalol  400 mg Per Tube TID  . lacosamide  200 mg Oral BID  . levETIRAcetam  1,000 mg Per Tube BID  . multivitamin  15 mL Oral Daily  . nutrition supplement (JUVEN)  1 packet Per Tube BID BM  . pantoprazole sodium  40 mg Per Tube QHS  . phenytoin  250 mg Per Tube BID  . scopolamine  1 patch Transdermal Q72H  . thiamine  100 mg Per Tube Daily   Continuous Infusions: . ceFEPime (MAXIPIME) IV Stopped (12/15/17 0858)    . dextrose 5 % with KCl 20 mEq / L 20 mEq (12/15/17 1126)  . feeding supplement (JEVITY 1.2 CAL) 1,000 mL (12/14/17 1714)  . vancomycin Stopped (12/15/17 0247)   PRN Meds:.acetaminophen, albuterol, bisacodyl, docusate, HYDROcodone-acetaminophen, LORazepam, ondansetron **OR** ondansetron (ZOFRAN) IV, senna-docusate   Antibiotics   Anti-infectives (From admission, onward)   Start     Dose/Rate Route Frequency Ordered Stop   12/14/17 0130  vancomycin (VANCOCIN) IVPB 750 mg/150 ml premix     750 mg 150 mL/hr over 60 Minutes Intravenous Every 12 hours 12/13/17 1325     12/13/17 1600  ceFEPIme (MAXIPIME) 1 g in sodium chloride 0.9 % 100 mL IVPB     1 g 200 mL/hr over 30 Minutes Intravenous Every 8 hours 12/13/17 1325     12/13/17 1330  vancomycin (VANCOCIN) 1,250 mg in sodium chloride 0.9 % 250 mL IVPB     1,250 mg 166.7 mL/hr over 90 Minutes Intravenous  Once 12/13/17 1325 12/13/17 1634   12/10/17 1200  cefTRIAXone (ROCEPHIN) 2 g in sodium chloride 0.9 % 100 mL IVPB  Status:  Discontinued     2 g 200 mL/hr over 30 Minutes Intravenous Every 24 hours 12/10/17 0946 12/13/17 1325   12/10/17 0900  vancomycin (VANCOCIN) IVPB 1000 mg/200 mL premix  Status:  Discontinued     1,000 mg 200 mL/hr over 60 Minutes Intravenous Every 24 hours 12/09/17 0816 12/10/17 0946   12/09/17 1400  piperacillin-tazobactam (ZOSYN) IVPB 3.375 g  Status:  Discontinued     3.375 g 12.5 mL/hr over 240 Minutes Intravenous Every 8 hours 12/09/17 0816 12/10/17 0946   12/09/17 0715  piperacillin-tazobactam (ZOSYN) IVPB 3.375 g     3.375 g 100 mL/hr over 30 Minutes Intravenous  Once 12/09/17 0704 12/09/17 0912   12/09/17 0715  vancomycin (VANCOCIN) IVPB 1000 mg/200 mL premix  Status:  Discontinued     1,000 mg 200 mL/hr over 60 Minutes Intravenous  Once 12/09/17 0704 12/09/17 0706   12/09/17 0715  vancomycin (VANCOCIN) 1,500 mg in sodium chloride 0.9 % 500 mL IVPB     1,500 mg 250 mL/hr over 120 Minutes Intravenous   Once 12/09/17 0706 12/09/17 1215        Subjective:   [name] was seen and examined today.  Still spiking low-grade fevers, lung rhonchorous, increased secretions.  No meaningful recovery in the last few days, nonverbal.    Objective:   Vitals:   12/15/17 0845 12/15/17 0857 12/15/17 1055 12/15/17 1214  BP: (!) 185/97  (!) 150/75   Pulse: 99 (!) 102 100 92  Resp: (!) 27 (!) 27 (!) 26 (!) 23  Temp:  100.2 F (37.9 C) 100.2 F (37.9 C)   TempSrc:   Bladder   SpO2: 100% 100% 100% 100%  Weight:  Height:        Intake/Output Summary (Last 24 hours) at 12/15/2017 1220 Last data filed at 12/15/2017 1012 Gross per 24 hour  Intake 0 ml  Output 6200 ml  Net -6200 ml     Wt Readings from Last 3 Encounters:  11/16/17 83.6 kg (184 lb 4.9 oz)  10/18/17 80.3 kg (177 lb)  09/14/17 82.1 kg (181 lb)     Exam   General: Nonverbal, does not respond to any verbal commands,  Eyes:   HEENT:  Cardiovascular: S1 S2 auscultated,  Regular rate and rhythm.  Respiratory: Diffuse rhonchi bilateral  Gastrointestinal: Soft, PEG tube, NBS  Ext: bilateral LE in heel protectors  Neuro does not follow commands  Musculoskeletal: No digital cyanosis, clubbing  Skin: No rashes  Psych: nonverbal     Data Reviewed:  I have personally reviewed following labs and imaging studies  Micro Results Recent Results (from the past 240 hour(s))  Culture, blood (Routine x 2)     Status: Abnormal   Collection Time: 12/09/17  7:00 AM  Result Value Ref Range Status   Specimen Description BLOOD RIGHT HAND  Final   Special Requests   Final    BOTTLES DRAWN AEROBIC AND ANAEROBIC Blood Culture adequate volume   Culture  Setup Time   Final    GRAM NEGATIVE RODS AEROBIC BOTTLE ONLY CRITICAL RESULT CALLED TO, READ BACK BY AND VERIFIED WITH: E MARTIN,PHARMD AT 1245 12/10/17 BY L BENFIELD Performed at [facility], [address], [place], [place] [address]    Culture PROTEUS  MIRABILIS (A)  Final   Report Status 12/12/2017 FINAL  Final   Organism ID, Bacteria PROTEUS MIRABILIS  Final      Susceptibility   Proteus mirabilis - MIC*    AMPICILLIN <=2 SENSITIVE Sensitive     CEFAZOLIN <=4 SENSITIVE Sensitive     CEFEPIME <=1 SENSITIVE Sensitive     CEFTAZIDIME <=1 SENSITIVE Sensitive     CEFTRIAXONE <=1 SENSITIVE Sensitive     CIPROFLOXACIN >=4 RESISTANT Resistant     GENTAMICIN <=1 SENSITIVE Sensitive     IMIPENEM 2 SENSITIVE Sensitive     TRIMETH/SULFA <=20 SENSITIVE Sensitive     AMPICILLIN/SULBACTAM <=2 SENSITIVE Sensitive     PIP/TAZO <=4 SENSITIVE Sensitive     * PROTEUS MIRABILIS  Blood Culture ID Panel (Reflexed)     Status: Abnormal   Collection Time: 12/09/17  7:00 AM  Result Value Ref Range Status   Enterococcus species NOT DETECTED NOT DETECTED Final   Listeria monocytogenes NOT DETECTED NOT DETECTED Final   Staphylococcus species NOT DETECTED NOT DETECTED Final   Staphylococcus aureus NOT DETECTED NOT DETECTED Final   Streptococcus species NOT DETECTED NOT DETECTED Final   Streptococcus agalactiae NOT DETECTED NOT DETECTED Final   Streptococcus pneumoniae NOT DETECTED NOT DETECTED Final   Streptococcus pyogenes NOT DETECTED NOT DETECTED Final   Acinetobacter baumannii NOT DETECTED NOT DETECTED Final   Enterobacteriaceae species DETECTED (A) NOT DETECTED Final    Comment: Enterobacteriaceae represent a large family of gram-negative bacteria, not a single organism. CRITICAL RESULT CALLED TO, READ BACK BY AND VERIFIED WITH: E MARTIN,PHARMD AT 3382 12/10/17 BY L BENFIELD    Enterobacter cloacae complex NOT DETECTED NOT DETECTED Final   Escherichia coli NOT DETECTED NOT DETECTED Final   Klebsiella oxytoca NOT DETECTED NOT DETECTED Final   Klebsiella pneumoniae NOT DETECTED NOT DETECTED Final   Proteus species DETECTED (A) NOT DETECTED Final    Comment: CRITICAL  RESULT CALLED TO, READ BACK BY AND VERIFIED WITH: E MARTIN,PHARMD AT 0925 12/10/17 BY L  BENFIELD    Serratia marcescens NOT DETECTED NOT DETECTED Final   Carbapenem resistance NOT DETECTED NOT DETECTED Final   Haemophilus influenzae NOT DETECTED NOT DETECTED Final   Neisseria meningitidis NOT DETECTED NOT DETECTED Final   Pseudomonas aeruginosa NOT DETECTED NOT DETECTED Final   Candida albicans NOT DETECTED NOT DETECTED Final   Candida glabrata NOT DETECTED NOT DETECTED Final   Candida krusei NOT DETECTED NOT DETECTED Final   Candida parapsilosis NOT DETECTED NOT DETECTED Final   Candida tropicalis NOT DETECTED NOT DETECTED Final    Comment: Performed at [facility], [place] [address], [place], [place] [address]  Culture, blood (Routine x 2)     Status: None   Collection Time: 12/09/17  6:58 PM  Result Value Ref Range Status   Specimen Description BLOOD BLOOD LEFT WRIST  Final   Special Requests   Final    BOTTLES DRAWN AEROBIC ONLY Blood Culture adequate volume   Culture   Final    NO GROWTH 5 DAYS Performed at [facility], [place] [address] [place], [place] [address]    Report Status 12/14/2017 FINAL  Final  MRSA PCR Screening     Status: None   Collection Time: 12/11/17  5:36 AM  Result Value Ref Range Status   MRSA by PCR NEGATIVE NEGATIVE Final    Comment:        The GeneXpert MRSA Assay (FDA approved for NASAL specimens only), is one component of a comprehensive MRSA colonization surveillance program. It is not intended to diagnose MRSA infection nor to guide or monitor treatment for MRSA infections. Performed at [facility], [place] [address] [place], [place] [address]     Radiology Reports Dg Chest Portable 1 View  Result Date: 12/09/2017 CLINICAL DATA:  Sepsis EXAM: PORTABLE CHEST 1 VIEW COMPARISON:  Chest x-rays dated 11/08/2017 and 11/05/2017. FINDINGS: Heart size and mediastinal contours are stable. Tracheostomy tube appears appropriately positioned in the midline. There is chronic central pulmonary vascular congestion. Linear  opacity within the LEFT mid lung region is compatible with atelectasis. No new confluent opacity to suggest a developing pneumonia. No pleural effusion or pneumothorax seen. No acute or suspicious osseous finding. IMPRESSION: 1. No evidence of pneumonia or pulmonary edema. 2. Chronic mild central pulmonary vascular congestion. Electronically Signed   By: Franki Cabot M.D.   On: 12/09/2017 07:21    Lab Data:  CBC: Recent Labs  Lab 12/09/17 0658 12/10/17 0304 12/10/17 2313 12/11/17 0322 12/12/17 0425 12/13/17 0448 12/15/17 0725  WBC 27.9* 22.4*  --  23.3* 21.9* 21.8* 17.8*  NEUTROABS 22.0*  --   --   --   --   --   --   HGB 8.7* 6.6* 9.9* 10.1* 9.6* 9.1* 9.1*  HCT 30.0* 22.8* 32.4* 33.4* 31.4* 29.1* 30.1*  MCV 104.9* 106.0*  --  96.5 96.9 93.6 96.5  PLT 291 208  --  243 238 189 865   Basic Metabolic Panel: Recent Labs  Lab 12/11/17 0322  12/11/17 2255 12/12/17 0425 12/12/17 1141  12/13/17 0448  12/14/17 1047 12/14/17 1626 12/14/17 2244 12/15/17 0457 12/15/17 1030  NA 163*   < > 156* 156* 156*   < > 148*   < > 144 140 143 141 141  K 2.6*  --  5.1 2.7* 2.9*  --  3.9  --   --   --   --  3.7  --   CL 127*  --  126* 129*  --   --  119*  --   --   --   --  117*  --   CO2 21*  --  17* 18*  --   --  15*  --   --   --   --  16*  --   GLUCOSE 156*  --  225* 177*  --   --  231*  --   --   --   --  283*  --   BUN 168*  --  133* 124*  --   --  98*  --   --   --   --  81*  --   CREATININE 1.80*  --  1.39* 1.35*  --   --  1.16*  --   --   --   --  1.02*  --   CALCIUM 9.1  --  8.7* 8.5*  --   --  8.3*  --   --   --   --  8.7*  --   MG  --   --   --  2.3  --   --   --   --   --   --   --   --   --    < > = values in this interval not displayed.   GFR: Estimated Creatinine Clearance: 58.5 mL/min (A) (by C-G formula based on SCr of 1.02 mg/dL (H)). Liver Function Tests: Recent Labs  Lab 12/09/17 0658 12/10/17 0304 12/11/17 0322 12/12/17 0425 12/13/17 0448  AST 398* 163* 174* 166*  157*  ALT 157* 98* 106* 97* 91*  ALKPHOS 57 44 63 82 89  BILITOT 0.5 0.6 0.6 0.4 0.4  PROT 7.8 6.0* 7.4 7.0 6.3*  ALBUMIN 1.7* 1.3* 1.5* 1.4* 1.4*   No results for input(s): LIPASE, AMYLASE in the last 168 hours. No results for input(s): AMMONIA in the last 168 hours. Coagulation Profile: Recent Labs  Lab 12/09/17 0658 12/09/17 1650  INR 1.55 1.65   Cardiac Enzymes: No results for input(s): CKTOTAL, CKMB, CKMBINDEX, TROPONINI in the last 168 hours. BNP (last 3 results) No results for input(s): PROBNP in the last 8760 hours. HbA1C: No results for input(s): HGBA1C in the last 72 hours. CBG: Recent Labs  Lab 12/14/17 2047 12/15/17 0013 12/15/17 0407 12/15/17 0802 12/15/17 1130  GLUCAP 178* 194* 249* 215* 196*   Lipid Profile: No results for input(s): CHOL, HDL, LDLCALC, TRIG, CHOLHDL, LDLDIRECT in the last 72 hours. Thyroid Function Tests: No results for input(s): TSH, T4TOTAL, FREET4, T3FREE, THYROIDAB in the last 72 hours. Anemia Panel: No results for input(s): VITAMINB12, FOLATE, FERRITIN, TIBC, IRON, RETICCTPCT in the last 72 hours. Urine analysis:    Component Value Date/Time   COLORURINE AMBER (A) 12/09/2017 0758   APPEARANCEUR CLOUDY (A) 12/09/2017 0758   LABSPEC 1.016 12/09/2017 0758   PHURINE 9.0 (H) 12/09/2017 0758   GLUCOSEU 50 (A) 12/09/2017 0758   GLUCOSEU > 1000 mg/dL (A) 11/01/2007 2056   HGBUR NEGATIVE 12/09/2017 0758   HGBUR negative 07/15/2007 0954   BILIRUBINUR NEGATIVE 12/09/2017 0758   KETONESUR NEGATIVE 12/09/2017 0758   PROTEINUR >=300 (A) 12/09/2017 0758   UROBILINOGEN 1.0 04/09/2012 1524   NITRITE NEGATIVE 12/09/2017 0758   LEUKOCYTESUR LARGE (A) 12/09/2017 0758     Shylynn Bruning M.D. [facility] 12/15/2017, 12:20 PM  Pager: [phone] Between 7am to 7pm - call Pager - [phone]  After  7pm go to [email or web address] - password [email or web address]  Call night coverage person covering after 7pm

## 2017-12-15 NOTE — Social Work (Addendum)
CSW able to reach pt daughter [name] at [phone], she states this is her personal cell and she is available to be reached there. Pt daughter states that MD can call her with questions about pt and pt care decisions. CSW has passed on this information to attending MD and Palliative Medicine Team.   [place], [place] Work [phone]

## 2017-12-15 NOTE — Plan of Care (Signed)
  Problem: Education: Goal: Knowledge of General Education information will improve 12/15/2017 2008 by Drenda Freeze, RN Outcome: Not Progressing

## 2017-12-15 NOTE — Progress Notes (Signed)
Received call from Social Work that patient's daughter would pick up phone if called today.  I called and was able to reach patient's daughter, [name].    I introduced palliative care as specialized medical care for people living with serious illness. It focuses on providing relief from the symptoms and stress of a serious illness. The goal is to improve quality of life for both the patient and the family.  She recalls discussing with Vinie Sill from our team in the past.    I relayed to her concern about how critically ill her mother is and need for [place] to be able to speak further if she is going to act as surrogate on her mother's behalf.  Unfortunately, we were unable to have a full family meeting today as I am currently rounding at another hospital and could not leave to evaluate Ms. Witt at this moment as I have previously scheduled commitments with other families.  I did update Tiffany as best I could based on chart review of the situation.  Tiffany seemed overwhelmed by the information presented.  Therefore, Tiffany and I have made a plan for a family meeting via phone with a member of the palliative medicine team to discuss her mother's goals of care for tomorrow at 11 AM.   No charge note  Micheline Rough, MD [place] Team [phone]

## 2017-12-16 DIAGNOSIS — A419 Sepsis, unspecified organism: Secondary | ICD-10-CM

## 2017-12-16 DIAGNOSIS — Z515 Encounter for palliative care: Secondary | ICD-10-CM

## 2017-12-16 LAB — SODIUM: Sodium: 138 mmol/L (ref 135–145)

## 2017-12-16 LAB — BASIC METABOLIC PANEL
ANION GAP: 11 (ref 5–15)
BUN: 75 mg/dL — ABNORMAL HIGH (ref 6–20)
CALCIUM: 8.7 mg/dL — AB (ref 8.9–10.3)
CHLORIDE: 115 mmol/L — AB (ref 101–111)
CO2: 15 mmol/L — AB (ref 22–32)
Creatinine, Ser: 0.85 mg/dL (ref 0.44–1.00)
GFR calc Af Amer: >60 mL/min (ref >60)
GFR calc non Af Amer: >60 mL/min (ref >60)
GLUCOSE: 210 mg/dL — AB (ref 65–99)
POTASSIUM: 5.5 mmol/L — AB (ref 3.5–5.1)
Sodium: 141 mmol/L (ref 135–145)

## 2017-12-16 LAB — GLUCOSE, CAPILLARY
GLUCOSE-CAPILLARY: 165 mg/dL — AB (ref 65–99)
GLUCOSE-CAPILLARY: 200 mg/dL — AB (ref 65–99)
GLUCOSE-CAPILLARY: 210 mg/dL — AB (ref 65–99)
Glucose-Capillary: 183 mg/dL — ABNORMAL HIGH (ref 65–99)
Glucose-Capillary: 74 mg/dL (ref 65–99)

## 2017-12-16 MED ORDER — MORPHINE SULFATE (CONCENTRATE) 10 MG/0.5ML PO SOLN: 5.0000 mg | ORAL | Status: DC | PRN

## 2017-12-16 MED ORDER — ACETAMINOPHEN 160 MG/5ML PO SOLN
650.0000 mg | Freq: Three times a day (TID) | ORAL | Status: DC
Start: 2017-12-16 — End: 2017-12-18
  Administered 2017-12-16 – 2017-12-18 (×8): 650 mg via ORAL
  Filled 2017-12-16 (×7): qty 20.3

## 2017-12-16 NOTE — Plan of Care (Signed)
  Problem: Education: Goal: Knowledge of General Education information will improve Outcome: Progressing   Problem: Health Behavior/Discharge Planning: Goal: Ability to manage health-related needs will improve Outcome: Progressing   

## 2017-12-16 NOTE — Progress Notes (Signed)
[facility]                                                                              Patient Demographics  [name], is a 62 y.o. female, DOB - Oct 15, 1955, AOZ:[id number]  Admit date - 12/09/2017   Admitting Physician Lady Deutscher, MD  Outpatient Primary MD for the patient is Nolene Ebbs, MD  Outpatient specialists:   LOS - 7  days   Medical records reviewed and are as summarized below:    Chief Complaint  Patient presents with  . Fever       Brief summary   Patient is a 62 year old female with a history of watershed infarcts, status epilepticus, hypoxic respiratory failure, status post trach, PEG tube, polysubstance abuse (cocaine, tobacco, alcohol), hypertension, hyperlipidemia, uncontrolled diabetes, RUE DVT, multiple hospitalization, has been in a persistent vegetative state was brought from her skilled nursing facility with a fever of 103 F, found to have sepsis presumed secondary to sacral decubitus ulcer, right lower leg ulcer.  Patient was also seen in the hospital 4/29-5/10 for anemia requiring blood transfusions after previous episode of hemorrhagic shock related to abdominal wall hematoma   Assessment & Plan    Principal Problem:   Sepsis ([place]) with Proteus/Enterobacter bacteremia, presumed secondary to sacral decub ulcer, right lower extremity ulcer present prior to admission -Leukocytosis improving, wound care following closely for dressings, will need debridement for ultimate healing and resolution -Antibiotics changed to IV vancomycin and cefepime on 6/6, due to persistent fevers - leukocytosis improving, still has low-grade fevers, significant secretions despite suctioning multiple times.  No meaningful recovery in the last few days.   Active Problems:    Type II diabetes mellitus with renal manifestations (HCC) -Hemoglobin A1c 8.2, poorly controlled, continue tube feeds -Continue Lantus 45 units twice a day, sliding scale  insulin   Hyperkalemia -Will DC IV fluids with potassium supplementation, awaiting goals of care meeting today  Chronic respiratory failure with tracheostomy status ([place]) Continue to monitor, O2, pulmonary hygiene and suctioning Respiratory therapy following for trach site maintenance.  Chest x-ray showed no pneumonia.  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 acetaminophen (TYLENOL) oral liquid 160 mg/5 mL  650 mg Oral Q8H  . amLODipine  10 mg Per Tube Daily  . cloNIDine  0.2 mg Per Tube TID  . feeding supplement (PRO-STAT SUGAR FREE 64)  30 mL Per Tube TID  . folic acid  1 mg Oral Daily  . free water  400 mL Per Tube Q2H  . hydrALAZINE  100 mg Per Tube Q8H  . insulin aspart  0-20 Units Subcutaneous Q4H  . insulin glargine  45 Units Subcutaneous BID  . labetalol  400 mg Per Tube TID  . lacosamide  200 mg Oral BID  . levETIRAcetam  1,000 mg Per Tube BID  . multivitamin  15 mL Oral Daily  . nutrition supplement (JUVEN)  1 packet Per Tube BID BM  . pantoprazole sodium  40 mg Per Tube QHS  . phenytoin  250 mg Per Tube BID  .  scopolamine  1 patch Transdermal Q72H  . thiamine  100 mg Per Tube Daily   Continuous Infusions: . ceFEPime (MAXIPIME) IV Stopped (12/16/17 0955)  . feeding supplement (JEVITY 1.2 CAL) 1,000 mL (12/15/17 1800)  . vancomycin Stopped (12/16/17 0307)   PRN Meds:.acetaminophen, albuterol, bisacodyl, docusate, HYDROcodone-acetaminophen, LORazepam, ondansetron **OR** ondansetron (ZOFRAN) IV, senna-docusate   Antibiotics   Anti-infectives (From admission, onward)   Start     Dose/Rate Route Frequency Ordered Stop   12/14/17 0130  vancomycin (VANCOCIN) IVPB 750 mg/150 ml premix     750 mg 150 mL/hr over 60 Minutes Intravenous Every 12 hours 12/13/17 1325     12/13/17 1600  ceFEPIme (MAXIPIME) 1 g in sodium chloride 0.9 % 100 mL IVPB     1 g 200 mL/hr over 30 Minutes Intravenous Every 8 hours 12/13/17 1325     12/13/17 1330  vancomycin (VANCOCIN) 1,250 mg in sodium chloride 0.9 % 250 mL IVPB     1,250 mg 166.7 mL/hr over 90 Minutes Intravenous  Once 12/13/17 1325 12/13/17 1634   12/10/17 1200  cefTRIAXone (ROCEPHIN) 2 g in sodium chloride 0.9 % 100 mL IVPB  Status:  Discontinued     2 g 200 mL/hr over 30 Minutes Intravenous Every 24 hours 12/10/17 0946 12/13/17 1325   12/10/17 0900  vancomycin (VANCOCIN) IVPB 1000 mg/200 mL premix  Status:  Discontinued     1,000 mg 200 mL/hr over 60 Minutes Intravenous Every 24 hours 12/09/17 0816 12/10/17 0946   12/09/17 1400  piperacillin-tazobactam (ZOSYN) IVPB 3.375 g  Status:  Discontinued     3.375 g 12.5 mL/hr over 240 Minutes Intravenous Every 8 hours 12/09/17 0816 12/10/17 0946   12/09/17 0715  piperacillin-tazobactam (ZOSYN) IVPB 3.375 g     3.375 g 100 mL/hr over 30 Minutes Intravenous  Once 12/09/17 0704 12/09/17 0912   12/09/17 0715  vancomycin (VANCOCIN) IVPB 1000 mg/200 mL premix  Status:  Discontinued     1,000 mg 200 mL/hr over 60 Minutes Intravenous  Once 12/09/17 0704 12/09/17 0706   12/09/17 0715  vancomycin (VANCOCIN) 1,500 mg in  sodium chloride 0.9 % 500 mL IVPB     1,500 mg 250 mL/hr over 120 Minutes Intravenous  Once 12/09/17 0706 12/09/17 1215        Subjective:   Shekelia Borrelli was seen and examined today.  Appears miserable, no fevers however lungs rhonchorous and increased secretions despite suctioning multiple times.  Nonverbal.    Objective:   Vitals:   12/16/17 0400 12/16/17 0800 12/16/17 0824 12/16/17 0911  BP: (!) 141/76 (!) 162/84 (!) 162/84   Pulse: 91 90 (!) 101 95  Resp: (!) 35 (!) 21 (!) 21   Temp: 99.7 F (37.6 C) 98.6 F (37 C) 98.6 F (37 C)   TempSrc: Bladder  Bladder    SpO2: 97% 100% 100%   Weight:      Height:        Intake/Output Summary (Last 24 hours) at 12/16/2017 1135 Last data filed at 12/16/2017 0830 Gross per 24 hour  Intake 6579.33 ml  Output 5600 ml  Net 979.33 ml     Wt Readings from Last 3 Encounters:  11/16/17 83.6 kg (184 lb 4.9 oz)  10/18/17 80.3 kg (177 lb)  09/14/17 82.1 kg (181 lb)     Exam    General: Nonverbal, does not respond to any verbal commands  Eyes:  HEENT: trach  Cardiovascular: S1 S2 auscultated,  Regular rate and rhythm.   Respiratory: Lungs rhonchorous diffusely  Gastrointestinal: Soft, nontender, nondistended, + bowel sounds, PEG  Ext: LE in b/l heel protectors  Neuro: does not follow commands  Musculoskeletal: No digital cyanosis, clubbing  Skin: No rashes  Psych: does not follow commands, nonverbal    Data Reviewed:  I have personally reviewed following labs and imaging studies  Micro Results Recent Results (from the past 240 hour(s))  Culture, blood (Routine x 2)     Status: Abnormal   Collection Time: 12/09/17  7:00 AM  Result Value Ref Range Status   Specimen Description BLOOD RIGHT HAND  Final   Special Requests   Final    BOTTLES DRAWN AEROBIC AND ANAEROBIC Blood Culture adequate volume   Culture  Setup Time   Final    GRAM NEGATIVE RODS AEROBIC BOTTLE ONLY CRITICAL RESULT CALLED TO, READ BACK BY  AND VERIFIED WITH: E MARTIN,PHARMD AT 8676 12/10/17 BY L BENFIELD Performed at [facility], [address], [place], [place] [address]    Culture PROTEUS MIRABILIS (A)  Final   Report Status 12/12/2017 FINAL  Final   Organism ID, Bacteria PROTEUS MIRABILIS  Final      Susceptibility   Proteus mirabilis - MIC*    AMPICILLIN <=2 SENSITIVE Sensitive     CEFAZOLIN <=4 SENSITIVE Sensitive     CEFEPIME <=1 SENSITIVE Sensitive     CEFTAZIDIME <=1 SENSITIVE Sensitive     CEFTRIAXONE <=1 SENSITIVE Sensitive     CIPROFLOXACIN >=4 RESISTANT Resistant     GENTAMICIN <=1 SENSITIVE Sensitive     IMIPENEM 2 SENSITIVE Sensitive     TRIMETH/SULFA <=20 SENSITIVE Sensitive     AMPICILLIN/SULBACTAM <=2 SENSITIVE Sensitive     PIP/TAZO <=4 SENSITIVE Sensitive     * PROTEUS MIRABILIS  Blood Culture ID Panel (Reflexed)     Status: Abnormal   Collection Time: 12/09/17  7:00 AM  Result Value Ref Range Status   Enterococcus species NOT DETECTED NOT DETECTED Final   Listeria monocytogenes NOT DETECTED NOT DETECTED Final   Staphylococcus species NOT DETECTED NOT DETECTED Final   Staphylococcus aureus NOT DETECTED NOT DETECTED Final   Streptococcus species NOT DETECTED NOT DETECTED Final   Streptococcus agalactiae NOT DETECTED NOT DETECTED Final   Streptococcus pneumoniae NOT DETECTED NOT DETECTED Final   Streptococcus pyogenes NOT DETECTED NOT DETECTED Final   Acinetobacter baumannii NOT DETECTED NOT DETECTED Final   Enterobacteriaceae species DETECTED (A) NOT DETECTED Final    Comment: Enterobacteriaceae represent a large family of gram-negative bacteria, not a single organism. CRITICAL RESULT CALLED TO, READ BACK BY AND VERIFIED WITH: E MARTIN,PHARMD AT 7096 12/10/17 BY L BENFIELD    Enterobacter cloacae complex NOT DETECTED NOT DETECTED Final   Escherichia coli NOT DETECTED NOT DETECTED Final   Klebsiella oxytoca NOT DETECTED NOT DETECTED  Final   Klebsiella pneumoniae NOT DETECTED NOT DETECTED  Final   Proteus species DETECTED (A) NOT DETECTED Final    Comment: CRITICAL RESULT CALLED TO, READ BACK BY AND VERIFIED WITH: E MARTIN,PHARMD AT 4970 12/10/17 BY L BENFIELD    Serratia marcescens NOT DETECTED NOT DETECTED Final   Carbapenem resistance NOT DETECTED NOT DETECTED Final   Haemophilus influenzae NOT DETECTED NOT DETECTED Final   Neisseria meningitidis NOT DETECTED NOT DETECTED Final   Pseudomonas aeruginosa NOT DETECTED NOT DETECTED Final   Candida albicans NOT DETECTED NOT DETECTED Final   Candida glabrata NOT DETECTED NOT DETECTED Final   Candida krusei NOT DETECTED NOT DETECTED Final   Candida parapsilosis NOT DETECTED NOT DETECTED Final   Candida tropicalis NOT DETECTED NOT DETECTED Final    Comment: Performed at [facility], [place] [address] [place], [place] [address]  Culture, blood (Routine x 2)     Status: None   Collection Time: 12/09/17  6:58 PM  Result Value Ref Range Status   Specimen Description BLOOD BLOOD LEFT WRIST  Final   Special Requests   Final    BOTTLES DRAWN AEROBIC ONLY Blood Culture adequate volume   Culture   Final    NO GROWTH 5 DAYS Performed at [facility], [place] [address] [place], [place] [address]    Report Status 12/14/2017 FINAL  Final  MRSA PCR Screening     Status: None   Collection Time: 12/11/17  5:36 AM  Result Value Ref Range Status   MRSA by PCR NEGATIVE NEGATIVE Final    Comment:        The GeneXpert MRSA Assay (FDA approved for NASAL specimens only), is one component of a comprehensive MRSA colonization surveillance program. It is not intended to diagnose MRSA infection nor to guide or monitor treatment for MRSA infections. Performed at [facility], [place] [address] [place], [place] [address]     Radiology Reports Dg Chest Portable 1 View  Result Date: 12/09/2017 CLINICAL DATA:  Sepsis EXAM: PORTABLE CHEST 1 VIEW COMPARISON:  Chest x-rays dated 11/08/2017 and 11/05/2017. FINDINGS: Heart size and  mediastinal contours are stable. Tracheostomy tube appears appropriately positioned in the midline. There is chronic central pulmonary vascular congestion. Linear opacity within the LEFT mid lung region is compatible with atelectasis. No new confluent opacity to suggest a developing pneumonia. No pleural effusion or pneumothorax seen. No acute or suspicious osseous finding. IMPRESSION: 1. No evidence of pneumonia or pulmonary edema. 2. Chronic mild central pulmonary vascular congestion. Electronically Signed   By: Franki Cabot M.D.   On: 12/09/2017 07:21    Lab Data:  CBC: Recent Labs  Lab 12/10/17 0304 12/10/17 2313 12/11/17 0322 12/12/17 0425 12/13/17 0448 12/15/17 0725  WBC 22.4*  --  23.3* 21.9* 21.8* 17.8*  HGB 6.6* 9.9* 10.1* 9.6* 9.1* 9.1*  HCT 22.8* 32.4* 33.4* 31.4* 29.1* 30.1*  MCV 106.0*  --  96.5 96.9 93.6 96.5  PLT 208  --  243 238 189 287   Basic Metabolic Panel: Recent Labs  Lab 12/11/17 2255 12/12/17 0425 12/12/17 1141  12/13/17 0448  12/15/17 0457 12/15/17 1030 12/15/17 1645 12/15/17 2333 12/16/17 0532  NA 156* 156* 156*   < > 148*   < > 141 141 141 138 141  K 5.1 2.7* 2.9*  --  3.9  --  3.7  --   --   --  5.5*  CL 126* 129*  --   --  119*  --  117*  --   --   --  115*  CO2 17* 18*  --   --  15*  --  16*  --   --   --  15*  GLUCOSE 225* 177*  --   --  231*  --  283*  --   --   --  210*  BUN 133* 124*  --   --  98*  --  81*  --   --   --  75*  CREATININE 1.39* 1.35*  --   --  1.16*  --  1.02*  --   --   --  0.85  CALCIUM 8.7* 8.5*  --   --  8.3*  --  8.7*  --   --   --  8.7*  MG  --  2.3  --   --   --   --   --   --   --   --   --    < > = values in this interval not displayed.   GFR: Estimated Creatinine Clearance: 70.2 mL/min (by C-G formula based on SCr of 0.85 mg/dL). Liver Function Tests: Recent Labs  Lab 12/10/17 0304 12/11/17 0322 12/12/17 0425 12/13/17 0448  AST 163* 174* 166* 157*  ALT 98* 106* 97* 91*  ALKPHOS 44 63 82 89  BILITOT 0.6  0.6 0.4 0.4  PROT 6.0* 7.4 7.0 6.3*  ALBUMIN 1.3* 1.5* 1.4* 1.4*   No results for input(s): LIPASE, AMYLASE in the last 168 hours. No results for input(s): AMMONIA in the last 168 hours. Coagulation Profile: Recent Labs  Lab 12/09/17 1650  INR 1.65   Cardiac Enzymes: No results for input(s): CKTOTAL, CKMB, CKMBINDEX, TROPONINI in the last 168 hours. BNP (last 3 results) No results for input(s): PROBNP in the last 8760 hours. HbA1C: No results for input(s): HGBA1C in the last 72 hours. CBG: Recent Labs  Lab 12/15/17 1608 12/15/17 2024 12/16/17 0000 12/16/17 0418 12/16/17 0820  GLUCAP 227* 202* 210* 183* 200*   Lipid Profile: No results for input(s): CHOL, HDL, LDLCALC, TRIG, CHOLHDL, LDLDIRECT in the last 72 hours. Thyroid Function Tests: No results for input(s): TSH, T4TOTAL, FREET4, T3FREE, THYROIDAB in the last 72 hours. Anemia Panel: No results for input(s): VITAMINB12, FOLATE, FERRITIN, TIBC, IRON, RETICCTPCT in the last 72 hours. Urine analysis:    Component Value Date/Time   COLORURINE AMBER (A) 12/09/2017 0758   APPEARANCEUR CLOUDY (A) 12/09/2017 0758   LABSPEC 1.016 12/09/2017 0758   PHURINE 9.0 (H) 12/09/2017 0758   GLUCOSEU 50 (A) 12/09/2017 0758   GLUCOSEU > 1000 mg/dL (A) 11/01/2007 2056   HGBUR NEGATIVE 12/09/2017 0758   HGBUR negative 07/15/2007 0954   BILIRUBINUR NEGATIVE 12/09/2017 0758   KETONESUR NEGATIVE 12/09/2017 0758   PROTEINUR >=300 (A) 12/09/2017 0758   UROBILINOGEN 1.0 04/09/2012 1524   NITRITE NEGATIVE 12/09/2017 0758   LEUKOCYTESUR LARGE (A) 12/09/2017 0758     Ripudeep Rai M.D. [facility] 12/16/2017, 11:35 AM  Pager: [phone] Between 7am to 7pm - call Pager - [phone]  After 7pm go to [email or web address] - password [email or web address]  Call night coverage person covering after 7pm

## 2017-12-16 NOTE — Progress Notes (Signed)
Patient ID: [name], female   DOB: 1956-01-05, 62 y.o.   MRN: [id number]   I called and spoke with [name], patient's daughter. She says she is making medical decisions for patient. There is another daughter but she tells me she does not want to be involved in decisions at this point.  I updated [name] on patient's current medical problems and discussed probable future clinical trajectory with nonhealing wounds, recurrent infections, rehospitalization, decline, and eventual death. We talked about option of stopping all aggressive medical interventions/treatments and instead focusing only on comfort care. Daughter says she feels patient's quality of life is poor. She is not interested in future aggressive treatments. She says she would like to just "let her go" and I clarified this to mean that she was ok with comfort care, which I explained in depth. We discussed hospice involvement either at SNF or hospice facility. She says she was also ok with hospice but would prefer patient to stay in the hospital if possible. Daughter had some questions about postmortem care, which I answered to the best of my ability. I will also ask SW to reach out to daughter.   Plan: Comfort care DNR SW to help coordinate hospice referral

## 2017-12-16 NOTE — Progress Notes (Signed)
Daily Progress Note   Patient Name: [name]       Date: 12/16/2017 DOB: 1956/02/11  Age: 62 y.o. MRN#: [id number] Attending Physician: Mendel Corning, MD Primary Care Physician: Nolene Ebbs, MD Admit Date: 12/09/2017  Reason for Consultation/Follow-up: Establishing goals of care  Subjective: Nursing reports that patient appears uncomfortable with wound care. Patient unable to verbalize or follow commands. Planned family call today at 40.  Length of Stay: 7  Current Medications: Scheduled Meds:  . acetaminophen (TYLENOL) oral liquid 160 mg/5 mL  650 mg Oral Q8H  . amLODipine  10 mg Per Tube Daily  . cloNIDine  0.2 mg Per Tube TID  . feeding supplement (PRO-STAT SUGAR FREE 64)  30 mL Per Tube TID  . folic acid  1 mg Oral Daily  . free water  400 mL Per Tube Q2H  . hydrALAZINE  100 mg Per Tube Q8H  . insulin aspart  0-20 Units Subcutaneous Q4H  . insulin glargine  45 Units Subcutaneous BID  . labetalol  400 mg Per Tube TID  . lacosamide  200 mg Oral BID  . levETIRAcetam  1,000 mg Per Tube BID  . multivitamin  15 mL Oral Daily  . nutrition supplement (JUVEN)  1 packet Per Tube BID BM  . pantoprazole sodium  40 mg Per Tube QHS  . phenytoin  250 mg Per Tube BID  . scopolamine  1 patch Transdermal Q72H  . thiamine  100 mg Per Tube Daily    Continuous Infusions: . ceFEPime (MAXIPIME) IV 1 g (12/16/17 0925)  . feeding supplement (JEVITY 1.2 CAL) 1,000 mL (12/15/17 1800)  . vancomycin Stopped (12/16/17 0307)    PRN Meds: acetaminophen, albuterol, bisacodyl, docusate, HYDROcodone-acetaminophen, LORazepam, ondansetron **OR** ondansetron (ZOFRAN) IV, senna-docusate  Physical Exam          Vital Signs: BP (!) 162/84   Pulse 95   Temp 98.6 F (37 C)   Resp (!) 21   Ht  5\' 4"  (1.626 m)   Wt 77.9 kg (171 lb 11.8 oz)   SpO2 100%   BMI 29.48 kg/m  SpO2: SpO2: 100 % O2 Device: O2 Device: Tracheostomy Collar O2 Flow Rate: O2 Flow Rate (L/min): 5 L/min  Intake/output summary:   Intake/Output Summary (Last 24 hours) at 12/16/2017 1103 Last data filed at  12/16/2017 0830 Gross per 24 hour  Intake 6579.33 ml  Output 5600 ml  Net 979.33 ml   LBM: Last BM Date: 12/15/17(flexiseal) Baseline Weight: Weight: 83.5 kg (184 lb) Most recent weight: Weight: (bed scale broken)       Palliative Assessment/Data:      Patient Active Problem List   Diagnosis Date Noted  . Sacral ulcer ([place]) 12/09/2017  . 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 Goals of care, counseling/discussion   . Palliative care encounter   . Acute blood loss anemia 11/06/2017  . Persistent vegetative state ([place]) 11/06/2017  . Occult blood in stools 11/06/2017  . HCAP (healthcare-associated pneumonia) 11/06/2017  . AKI (acute kidney injury) ([place])   . Metabolic acidemia   . Symptomatic anemia 10/08/2017  . Sepsis ([place]) 10/08/2017  . Acute bilat watershed infarction [facility])   . Acute respiratory failure with hypoxia ([place])   . Essential hypertension   . Diabetes mellitus type 2, uncontrolled, with complications ([place])   . Chronic diastolic CHF (congestive heart failure) ([place]) 08/30/2017  . Acute deep vein thrombosis (DVT) of right upper extremity ([employer]) 08/30/2017  . Tracheostomy status ([place])   . Acute respiratory failure with hypoxemia ([place])   . Pressure injury of skin 08/04/2017  . Altered mental status   . H/O open leg wound 06/04/2017  . Open leg wound 06/04/2017  . Acute metabolic encephalopathy 66/44/0347  . Intracranial bleed ([place]) 05/26/2017  . ICH (intracerebral hemorrhage) ([place]) 05/26/2017  . Type II diabetes mellitus with renal manifestations ([place]) 05/25/2017  . SIRS (systemic inflammatory response syndrome) ([place]) 05/25/2017  . Aspiration pneumonia due to gastric  secretions ([place])   . Confusion 05/10/2017  . Chronic hepatitis C without hepatic coma ([place])   . Labile blood glucose   . Hypoglycemia associated with type 2 diabetes mellitus ([place])   . Poorly controlled type 2 diabetes mellitus with peripheral neuropathy ([place])   . Neurologic gait disorder   . Neuropathic pain   . Type 2 diabetes mellitus with peripheral neuropathy (HCC)   . History of intracranial hemorrhage   . Diarrhea   . Urinary incontinence   . Hypokalemia   . Polysubstance abuse ([place])   . Cerebrovascular accident (CVA) ([place])   . Disorientation   . Meningitis 11/02/2016  . Type 2 diabetes mellitus with hyperosmolar nonketotic hyperglycemia ([place]) 10/31/2016  . Seizure ([place]) 10/31/2016  . ARF (acute renal failure) ([place]) 10/31/2016  . Severe sepsis ([place]) 10/31/2016  . DKA (diabetic ketoacidoses) ([place]) 10/31/2016  . Acute renal failure superimposed on stage 3 chronic kidney disease ([place])   . Right hemiparesis ([place]) 06/09/2016  . 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 Cytotoxic brain edema ([place]) 06/08/2016  . Encephalopathy acute 06/06/2016  . Chest pain, musculoskeletal 05/16/2011  . Vaginal candidiasis 05/16/2011  . Diabetes mellitus, type 2 ([place]) 05/16/2011  . CHEST PAIN 10/01/2009  . CELLULITIS AND ABSCESS OF LEG EXCEPT FOOT 08/31/2009  . INSOMNIA 07/13/2008  . Methicillin resistant Staphylococcus aureus infection 04/17/2008  . NECK MASS 04/17/2008  . COCAINE ABUSE 11/01/2007  . OBESITY NOS 07/14/2006  . DENTAL CARIES 07/14/2006  . DIABETES MELLITUS, TYPE II 04/26/2006  . Alcohol abuse 04/26/2006  . TOBACCO ABUSE 04/26/2006  . DEPRESSION 04/26/2006  . Uncontrolled hypertension 04/26/2006  . GERD 04/26/2006  . LOW BACK PAIN 04/26/2006    Palliative Care Assessment & Plan   Patient Profile: 62 yo female with multiple medical problems including DM, HTN, and CKD, resulting in multiple admissions (5  times over the last 6 months) for hypertensive crises, intracranial  hemorrhages, watershed strokes (s/p PEG and trach), and status epilepticus. Recent hospital admission for anemia/blood loss/sepsis from pneumonia now readmitted 12/09/17 with sepsis presumed to be from wounds.   Assessment: I tried twice to call patient's daughter on her cell phone [phone]) at approximately 1100, which I was informed was the time agreed upon by daughter. However, the phone rang once and then went to voicemail. I did leave a voicemail for daughter requesting that she return my call.   Recommendations/Plan: I will continue to try to reach daughter to clarify goals.    Code Status:    Code Status Orders  (From admission, onward)        Start     Ordered   12/09/17 0858  Do not attempt resuscitation (DNR)  Continuous    Question Answer Comment  In the event of cardiac or respiratory ARREST Do not call a "code blue"   In the event of cardiac or respiratory ARREST Do not perform Intubation, CPR, defibrillation or ACLS   In the event of cardiac or respiratory ARREST Use medication by any route, position, wound care, and other measures to relive pain and suffering. May use oxygen, suction and manual treatment of airway obstruction as needed for comfort.      12/09/17 0901    Code Status History    Date Active Date Inactive Code Status Order ID Comments User Context   11/06/2017 0341 11/16/2017 1629 Full Code [id number]  Etta Quill, DO ED   10/08/2017 0824 10/18/2017 1554 Full Code [id number]  Georgette Shell, MD Inpatient   08/21/2017 0905 09/14/2017 2131 Full Code [id number]  Cherene Altes, MD Inpatient   08/18/2017 2346 08/21/2017 0905 Full Code [id number]  Corrie Mckusick, DO Inpatient   08/09/2017 1138 08/18/2017 2346 Partial Code [id number]  Rush Farmer, MD Inpatient   08/02/2017 1736 08/09/2017 1138 Full Code [id number]  Sampson Goon, MD ED   05/26/2017 0803 06/04/2017 2024 Full Code [id number]  Greta Doom, MD ED   05/25/2017 2327 05/26/2017 0802 Full  Code [id number]  Ivor Costa, MD ED   05/10/2017 1915 05/15/2017 2112 Full Code [id number]  Florencia Reasons, MD Inpatient   11/06/2016 1509 11/12/2016 1737 Full Code [id number]  Bary Leriche, PA-C Inpatient   10/31/2016 1900 11/02/2016 1820 Full Code [id number]  Omar Person, NP ED   06/09/2016 1801 06/20/2016 1532 Full Code [id number]  Cathlyn Parsons, PA-C Inpatient   06/09/2016 1801 06/09/2016 1801 Full Code [id number]  Cathlyn Parsons, PA-C Inpatient   06/06/2016 1705 06/09/2016 1744 Full Code [id number]  Darrel Reach, MD ED   05/15/2011 1636 05/16/2011 1842 Full Code [id number]  Joanna Hews, RN Inpatient        Thank you for allowing the Palliative Medicine Team to assist in the care of this patient.   Time In: 1045 Time Out: 1115 Total Time 30 minutes Prolonged Time Billed  NO      Greater than 50%  of this time was spent counseling and coordinating care related to the above assessment and plan.  Irean Hong, NP  Please contact Palliative Medicine Team phone at [phone] for questions and concerns.

## 2017-12-16 NOTE — Progress Notes (Signed)
Patient ID: [name], female   DOB: Aug 08, 1955, 62 y.o.   MRN: [id number]  I received a call from [place], patient's daughter asking me to call her sister, [place] at [phone]. I tried twice to call Gae Bon without success. I was unable to leave a voicemail due to a full mailbox.   I spoke again with [name] and a cousin, Vickie, via conference call. At [facility] request, I updated Vickie on patient's hospitalization, medical problems, prognosis, and plan of care. Family again confirmed desire for comfort care and hospice involvement.

## 2017-12-17 MED ORDER — MORPHINE SULFATE (CONCENTRATE) 10 MG/0.5ML PO SOLN
5.0000 mg | ORAL | 0 refills | Status: AC | PRN
Start: 2017-12-17 — End: ?

## 2017-12-17 MED ORDER — BISACODYL 10 MG RE SUPP: 10.0000 mg | Freq: Every day | RECTAL | 0 refills | Status: AC | PRN

## 2017-12-17 MED ORDER — SENNOSIDES-DOCUSATE SODIUM 8.6-50 MG PO TABS: 1.0000 | ORAL_TABLET | Freq: Every evening | ORAL | Status: AC | PRN

## 2017-12-17 MED ORDER — LABETALOL HCL 200 MG PO TABS: 400.0000 mg | ORAL_TABLET | Freq: Three times a day (TID) | ORAL | Status: AC

## 2017-12-17 NOTE — Discharge Summary (Addendum)
Physician Discharge Summary   Patient ID: [name] MRN: [id number] DOB/AGE: [birth year] 62 y.o.  Admit date: 12/09/2017 Discharge date: 12/17/2017  Primary Care Physician:  Nolene Ebbs, MD   Recommendations for Outpatient Follow-up:  1. Comfort care   Home Health: None Equipment/Devices: none   Discharge Condition: guarded, comfort care  CODE STATUS:   DNR   Diet recommendation: tube feeds    Discharge Diagnoses:     Sepsis ([place]) with Proteus/Enterobacter bacteremia sacral decub ulcer, right lower extremity ulcer present POA . Essential hypertension . Diabetes mellitus type 2, uncontrolled, with complications ([place]) . Persistent vegetative state ([place]) . Methicillin resistant Staphylococcus aureus infection . 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 Chronic diastolic CHF (congestive heart failure) ([place]) . Type II diabetes mellitus with renal manifestations ([place]) . comfort care     Consults: palliative care     Allergies:  No Known Allergies   DISCHARGE MEDICATIONS: Allergies as of 12/17/2017   No Known Allergies     Medication List    STOP taking these medications   amLODipine 10 MG tablet Commonly known as:  NORVASC   atorvastatin 40 MG tablet Commonly known as:  LIPITOR   cloNIDine 0.2 MG tablet Commonly known as:  CATAPRES   docusate 50 MG/5ML liquid Commonly known as:  COLACE   feeding supplement (PRO-STAT SUGAR FREE 64) Liqd   folic acid 1 MG tablet Commonly known as:  FOLVITE   hydrALAZINE 100 MG tablet Commonly known as:  APRESOLINE   insulin glargine 100 UNIT/ML injection Commonly known as:  LANTUS   insulin lispro 100 UNIT/ML injection Commonly known as:  HUMALOG   MULTIVITAMIN+ Liqd   nutrition supplement (JUVEN) Pack   pantoprazole sodium 40 mg/20 mL Pack Commonly known as:  PROTONIX   thiamine 100 MG tablet   topiramate 200 MG tablet Commonly known as:  TOPAMAX     TAKE these medications   acetaminophen 160  MG/5ML solution Commonly known as:  TYLENOL Place 10.2 mLs (325 mg total) into feeding tube every 6 (six) hours as needed for mild pain, headache or fever.   albuterol (2.5 MG/3ML) 0.083% nebulizer solution Commonly known as:  PROVENTIL Take 3 mLs (2.5 mg total) by nebulization every 2 (two) hours as needed for wheezing.   bisacodyl 10 MG suppository Commonly known as:  DULCOLAX Place 1 suppository (10 mg total) rectally daily as needed for moderate constipation.   labetalol 200 MG tablet Commonly known as:  NORMODYNE Place 2 tablets (400 mg total) into feeding tube 3 (three) times daily.   lacosamide 200 MG Tabs tablet Commonly known as:  VIMPAT Place 1 tablet (200 mg total) into feeding tube 2 (two) times daily.   levETIRAcetam 100 MG/ML solution Commonly known as:  KEPPRA Place 10 mLs (1,000 mg total) into feeding tube 2 (two) times daily.   LORazepam 2 MG/ML injection Commonly known as:  ATIVAN Inject 0.5 mLs (1 mg total) into the vein as needed for seizure (Give if seizure lasts longer than 5 minutes).   morphine CONCENTRATE 10 MG/0.5ML Soln concentrated solution Place 0.25-0.5 mLs (5-10 mg total) into feeding tube every 3 (three) hours as needed for moderate pain or shortness of breath (or dyspnea).   phenytoin 125 MG/5ML suspension Commonly known as:  DILANTIN Place 250 mg into feeding tube 2 (two) times daily.   scopolamine 1 MG/3DAYS Commonly known as:  TRANSDERM-SCOP Place 1 patch (1.5 mg total) onto the skin every 3 (three) days.   senna-docusate 8.6-50  MG tablet Commonly known as:  Senokot-S Take 1 tablet by mouth at bedtime as needed for mild constipation.        Brief H and P: For complete details please refer to admission H and P, but in briefPatient is a 62 year old female with a history of watershed infarcts, status epilepticus, hypoxic respiratory failure, status post trach, PEG tube, polysubstance abuse (cocaine, tobacco, alcohol), hypertension,  hyperlipidemia, uncontrolled diabetes, RUE DVT, multiple hospitalization, has been in a persistent vegetative state was brought from her skilled nursing facility with a fever of 103 F, found to have sepsis presumed secondary to sacral decubitus ulcer, right lower leg ulcer.  Patient was also seen in the hospital 4/29-5/10 for anemia requiring blood transfusions after previous episode of hemorrhagic shock related to abdominal wall hematoma   Hospital Course:  Sepsis [facility]) with Proteus/Enterobacter bacteremia, presumed secondary to sacral decub ulcer, right lower extremity ulcer present prior to admission -Patient was placed on IV vancomycin and cefepime on 6/6, due to persistent fevers. -No meaningful recovery in the last few days, goals of care on 6/9, by palliative care.  Patient's family has requested comfort care and hospice.    Type II diabetes mellitus with renal manifestations (HCC) -Hemoglobin A1c 8.2, poorly controlled, continue comfort care  Hyperkalemia -Comfort care  Chronic respiratory failure with tracheostomy status ([place]) On trach, chest x-ray showed no pneumonia.    Acute on chronic anemia -Currently no signs of bleeding, s/p 2 units packed RBCs on admission, baseline hemoglobin 7.5-8.5 -Hemoglobin 9.1 on 6/8, currently comfort care  Transaminitis -Improving, likely due to shock liver in the setting of sepsis and dehydration  Acute hypernatremia -Hyponatremia has improved  Acute on chronic CKD-II Baseline creatinine 1, peak of 2.49, improving -Creatinine back to base  Hypertension -Currently stable  Status epilepticus, persistently vegetative state -Continue phenytoin, Keppra, lacosamide -Topamax discontinued due to worsening metabolic acidosis, CO2 improved  Code Status: DNR   Day of Discharge S: non verbal  BP (!) 159/81 (BP Location: Left Arm)   Pulse 87   Temp 97.7 F (36.5 C)   Resp (!) 22   Ht 5\' 4"  (1.626 m)   Wt 77.9 kg (171 lb 11.8  oz)   SpO2 99%   BMI 29.48 kg/m   Physical Exam: General: awake, non rverbal  HEENT: anicteric sclera, pupils reactive to light and accommodation CVS: S1-S2 clear no murmur rubs or gallops Chest: coarse  rhonchi Abdomen: soft nontender, nondistended, normal bowel sounds Extremities: no cyanosis, clubbing or edema noted bilaterally Neuro: does not follow commands    The results of significant diagnostics from this hospitalization (including imaging, microbiology, ancillary and laboratory) are listed below for reference.      Procedures/Studies:  Dg Chest Portable 1 View  Result Date: 12/09/2017 CLINICAL DATA:  Sepsis EXAM: PORTABLE CHEST 1 VIEW COMPARISON:  Chest x-rays dated 11/08/2017 and 11/05/2017. FINDINGS: Heart size and mediastinal contours are stable. Tracheostomy tube appears appropriately positioned in the midline. There is chronic central pulmonary vascular congestion. Linear opacity within the LEFT mid lung region is compatible with atelectasis. No new confluent opacity to suggest a developing pneumonia. No pleural effusion or pneumothorax seen. No acute or suspicious osseous finding. IMPRESSION: 1. No evidence of pneumonia or pulmonary edema. 2. Chronic mild central pulmonary vascular congestion. Electronically Signed   By: Franki Cabot M.D.   On: 12/09/2017 07:21       LAB RESULTS: Basic Metabolic Panel: Recent Labs  Lab 12/12/17 0425  12/15/17 0457  12/15/17 2333 12/16/17 0532  NA 156*   < > 141   < > 138 141  K 2.7*   < > 3.7  --   --  5.5*  CL 129*   < > 117*  --   --  115*  CO2 18*   < > 16*  --   --  15*  GLUCOSE 177*   < > 283*  --   --  210*  BUN 124*   < > 81*  --   --  75*  CREATININE 1.35*   < > 1.02*  --   --  0.85  CALCIUM 8.5*   < > 8.7*  --   --  8.7*  MG 2.3  --   --   --   --   --    < > = values in this interval not displayed.   Liver Function Tests: Recent Labs  Lab 12/12/17 0425 12/13/17 0448  AST 166* 157*  ALT 97* 91*  ALKPHOS  82 89  BILITOT 0.4 0.4  PROT 7.0 6.3*  ALBUMIN 1.4* 1.4*   No results for input(s): LIPASE, AMYLASE in the last 168 hours. No results for input(s): AMMONIA in the last 168 hours. CBC: Recent Labs  Lab 12/13/17 0448 12/15/17 0725  WBC 21.8* 17.8*  HGB 9.1* 9.1*  HCT 29.1* 30.1*  MCV 93.6 96.5  PLT 189 310   Cardiac Enzymes: No results for input(s): CKTOTAL, CKMB, CKMBINDEX, TROPONINI in the last 168 hours. BNP: Invalid input(s): POCBNP CBG: Recent Labs  Lab 12/16/17 1138 12/16/17 2027  GLUCAP 165* 74      Disposition and Follow-up:    [facility]    DISCHARGE FOLLOW-UP Follow-up Information    Nolene Ebbs, MD Follow up.   Specialty:  Internal Medicine Why:  as needed  Contact information: [place] [id number] [phone]            Time coordinating discharge:  36 mins   Signed:   Estill Cotta M.D. [facility] 12/17/2017, 3:33 PM Pager: [phone]   Coding query addendum: Acute on Chronic anemia of undetermined nature, patient is comfort care, hence no futher aggressive testings or interventions were pursued   No clinical determination of relation of UTI with chronic catheter.   Full thickness sacral decub per nursing flow sheets   Sumi Lye M.D. [facility] 12/26/2017, 7:31 PM  Pager: [phone]

## 2017-12-17 NOTE — Progress Notes (Signed)
Clinical Social Worker was consulted to assist patient and family in a hospice referral. CSW spoke to patients daughter [name] via phone in regards to Hospice placement. Tiffany stated she would like her mother to transition to a Hospice facility and would like her close to the [place] area. Tiffany gave CSW verbal permission for CSW to do referral to [facility].   CSW reached out to Bevely Palmer at [facility] to assess patient and to reach out to family. CSW will continue to follow patient for Hospice placement.   Rhea Pink, MSW,  [place]

## 2017-12-17 NOTE — Progress Notes (Signed)
Clinical Social Worker facilitated patient discharge including contacting patient family and facility to confirm patient discharge plans.  Clinical information faxed to facility and family agreeable with plan.  CSW is waiting for patients family to finish paperwork needed to transitions patent [employer]. CSW placed medical necessity form on patients chart for transport. RN to contact [facility] once [employer] has received paperwork from family.  RN to call [phone] for report prior to discharge.  Clinical Social Worker will sign off for now as social work intervention is no longer needed. Please consult [place] again if new need arises.  Rhea Pink, MSW, [place]

## 2017-12-17 NOTE — Progress Notes (Signed)
[facility]                                                                              Patient Demographics  [name], is a 62 y.o. female, DOB - [birth year], GXQ:[id number]  Admit date - 12/09/2017   Admitting Physician Lady Deutscher, MD  Outpatient Primary MD for the patient is Nolene Ebbs, MD  Outpatient specialists:   LOS - 8  days   Medical records reviewed and are as summarized below:    Chief Complaint  Patient presents with  . Fever       Brief summary   Patient is a 62 year old female with a history of watershed infarcts, status epilepticus, hypoxic respiratory failure, status post trach, PEG tube, polysubstance abuse (cocaine, tobacco, alcohol), hypertension, hyperlipidemia, uncontrolled diabetes, RUE DVT, multiple hospitalization, has been in a persistent vegetative state was brought from her skilled nursing facility with a fever of 103 F, found to have sepsis presumed secondary to sacral decubitus ulcer, right lower leg ulcer.  Patient was also seen in the hospital 4/29-5/10 for anemia requiring blood transfusions after previous episode of hemorrhagic shock related to abdominal wall hematoma   Assessment & Plan    Principal Problem:   Sepsis ([place]) with Proteus/Enterobacter bacteremia, presumed secondary to sacral decub ulcer, right lower extremity ulcer present prior to admission -Patient was placed on IV vancomycin and cefepime on 6/6, due to persistent fevers. -No meaningful recovery in the last few days, goals of care on 6/9, by palliative care.  Patient's family has requested comfort care and hospice. - SW consult placed for residential hospice   Active Problems:    Type II diabetes mellitus with renal manifestations ([place]) -Hemoglobin A1c 8.2, poorly controlled, continue comfort care  Hyperkalemia -Comfort care  Chronic respiratory failure with tracheostomy status ([place]) On trach, chest x-ray showed no pneumonia.     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 acetaminophen (TYLENOL) oral liquid 160 mg/5 mL  650 mg Oral Q8H  . labetalol  400 mg Per Tube TID  . lacosamide  200 mg Oral BID  . levETIRAcetam  1,000 mg Per Tube BID  . phenytoin  250 mg Per Tube BID  . scopolamine  1 patch Transdermal Q72H   Continuous Infusions:  PRN Meds:.acetaminophen, albuterol, bisacodyl, docusate, LORazepam, morphine CONCENTRATE, ondansetron **OR** ondansetron (ZOFRAN) IV, senna-docusate  Antibiotics   Anti-infectives (From admission, onward)   Start     Dose/Rate Route Frequency Ordered Stop   12/14/17 0130  vancomycin (VANCOCIN) IVPB 750 mg/150 ml premix  Status:  Discontinued     750 mg 150 mL/hr over 60 Minutes Intravenous Every 12 hours 12/13/17 1325 12/16/17 1227   12/13/17 1600  ceFEPIme (MAXIPIME) 1 g in sodium chloride 0.9 % 100 mL IVPB  Status:  Discontinued     1 g 200 mL/hr over 30 Minutes Intravenous Every 8 hours 12/13/17 1325 12/16/17 1227   12/13/17 1330  vancomycin (VANCOCIN) 1,250 mg in sodium chloride 0.9 % 250 mL IVPB      1,250 mg 166.7 mL/hr over 90 Minutes Intravenous  Once 12/13/17 1325 12/13/17 1634   12/10/17 1200  cefTRIAXone (ROCEPHIN) 2 g in sodium chloride 0.9 % 100 mL IVPB  Status:  Discontinued     2 g 200 mL/hr over 30 Minutes Intravenous Every 24 hours 12/10/17 0946 12/13/17 1325   12/10/17 0900  vancomycin (VANCOCIN) IVPB 1000 mg/200 mL premix  Status:  Discontinued     1,000 mg 200 mL/hr over 60 Minutes Intravenous Every 24 hours 12/09/17 0816 12/10/17 0946   12/09/17 1400  piperacillin-tazobactam (ZOSYN) IVPB 3.375 g  Status:  Discontinued     3.375 g 12.5 mL/hr over 240 Minutes Intravenous Every 8 hours 12/09/17 0816 12/10/17 0946   12/09/17 0715  piperacillin-tazobactam (ZOSYN) IVPB 3.375 g     3.375 g 100 mL/hr over 30 Minutes Intravenous  Once 12/09/17 0704 12/09/17 0912   12/09/17 0715  vancomycin (VANCOCIN) IVPB 1000 mg/200 mL premix  Status:  Discontinued     1,000 mg 200 mL/hr over 60 Minutes Intravenous  Once 12/09/17 0704 12/09/17 0706   12/09/17 0715  vancomycin (VANCOCIN) 1,500 mg in sodium chloride 0.9 % 500 mL IVPB     1,500 mg 250 mL/hr over 120 Minutes Intravenous  Once 12/09/17 0706 12/09/17 1215        Subjective:   [name] was seen and examined today.  Alert and awake, no acute issues overnight.  Comfort care.  Nonverbal.  Objective:   Vitals:   12/16/17 2039 12/17/17 0003 12/17/17 0305 12/17/17 0731  BP:      Pulse: 91 92 83 87  Resp: (!) 23 17 16 17   Temp: 98.8 F (37.1 C) 98.4 F (36.9 C) 97.8 F (36.6 C) (!) 97.2 F (36.2 C)  TempSrc:      SpO2: 99% 97% 99% 99%  Weight:      Height:        Intake/Output Summary (Last 24 hours) at 12/17/2017 1027 Last data filed at 12/17/2017 1027 Gross per 24 hour  Intake 165 ml  Output 4050 ml  Net -3885 ml     Wt Readings from Last 3 Encounters:  11/16/17 83.6 kg (184 lb 4.9 oz)  10/18/17 80.3 kg (177 lb)  09/14/17 82.1 kg (181 lb)     Exam     General: Alert and awake, NAD  Eyes:    HEENT: trach +  Cardiovascular: S1 S2 clear, RRR   Respiratory: Coarse breath sounds bilaterally  Gastrointestinal: Soft, nontender, nondistended, + bowel sounds  Ext: lower extremity in heel protectors  Neuro: nonverbal  Musculoskeletal: No digital cyanosis, clubbing  Skin: No rashes  Psych: nonverbal, does not respond to verbal commands    Data Reviewed:  I have personally reviewed following labs and imaging studies  Micro Results Recent Results (from the past  240 hour(s))  Culture, blood (Routine x 2)     Status: Abnormal   Collection Time: 12/09/17  7:00 AM  Result Value Ref Range Status   Specimen Description BLOOD RIGHT HAND  Final   Special Requests   Final    BOTTLES DRAWN AEROBIC AND ANAEROBIC Blood Culture adequate volume   Culture  Setup Time   Final    GRAM NEGATIVE RODS AEROBIC BOTTLE ONLY CRITICAL RESULT CALLED TO, READ BACK BY AND VERIFIED WITH: E MARTIN,PHARMD AT 1740 12/10/17 BY L BENFIELD Performed at [facility], [place] [address], [place], [place] [address]    Culture PROTEUS MIRABILIS (A)  Final   Report Status 12/12/2017 FINAL  Final   Organism ID, Bacteria PROTEUS MIRABILIS  Final      Susceptibility   Proteus mirabilis - MIC*    AMPICILLIN <=2 SENSITIVE Sensitive     CEFAZOLIN <=4 SENSITIVE Sensitive     CEFEPIME <=1 SENSITIVE Sensitive     CEFTAZIDIME <=1 SENSITIVE Sensitive     CEFTRIAXONE <=1 SENSITIVE Sensitive     CIPROFLOXACIN >=4 RESISTANT Resistant     GENTAMICIN <=1 SENSITIVE Sensitive     IMIPENEM 2 SENSITIVE Sensitive     TRIMETH/SULFA <=20 SENSITIVE Sensitive     AMPICILLIN/SULBACTAM <=2 SENSITIVE Sensitive     PIP/TAZO <=4 SENSITIVE Sensitive     * PROTEUS MIRABILIS  Blood Culture ID Panel (Reflexed)     Status: Abnormal   Collection Time: 12/09/17  7:00 AM  Result Value Ref Range Status   Enterococcus species NOT DETECTED NOT DETECTED Final   Listeria monocytogenes NOT DETECTED NOT DETECTED Final   Staphylococcus  species NOT DETECTED NOT DETECTED Final   Staphylococcus aureus NOT DETECTED NOT DETECTED Final   Streptococcus species NOT DETECTED NOT DETECTED Final   Streptococcus agalactiae NOT DETECTED NOT DETECTED Final   Streptococcus pneumoniae NOT DETECTED NOT DETECTED Final   Streptococcus pyogenes NOT DETECTED NOT DETECTED Final   Acinetobacter baumannii NOT DETECTED NOT DETECTED Final   Enterobacteriaceae species DETECTED (A) NOT DETECTED Final    Comment: Enterobacteriaceae represent a large family of gram-negative bacteria, not a single organism. CRITICAL RESULT CALLED TO, READ BACK BY AND VERIFIED WITH: E MARTIN,PHARMD AT 1856 12/10/17 BY L BENFIELD    Enterobacter cloacae complex NOT DETECTED NOT DETECTED Final   Escherichia coli NOT DETECTED NOT DETECTED Final   Klebsiella oxytoca NOT DETECTED NOT DETECTED Final   Klebsiella pneumoniae NOT DETECTED NOT DETECTED Final   Proteus species DETECTED (A) NOT DETECTED Final    Comment: CRITICAL RESULT CALLED TO, READ BACK BY AND VERIFIED WITH: E MARTIN,PHARMD AT 3149 12/10/17 BY L BENFIELD    Serratia marcescens NOT DETECTED NOT DETECTED Final   Carbapenem resistance NOT DETECTED NOT DETECTED Final   Haemophilus influenzae NOT DETECTED NOT DETECTED Final   Neisseria meningitidis NOT DETECTED NOT DETECTED Final   Pseudomonas aeruginosa NOT DETECTED NOT DETECTED Final   Candida albicans NOT DETECTED NOT DETECTED Final   Candida glabrata NOT DETECTED NOT DETECTED Final   Candida krusei NOT DETECTED NOT DETECTED Final   Candida parapsilosis NOT DETECTED NOT DETECTED Final   Candida tropicalis NOT DETECTED NOT DETECTED Final    Comment: Performed at [facility], [place] [address], [place], [place] [address]  Culture, blood (Routine x 2)     Status: None   Collection Time: 12/09/17  6:58 PM  Result Value Ref Range Status   Specimen Description BLOOD BLOOD LEFT WRIST  Final   Special Requests   Final    BOTTLES DRAWN AEROBIC ONLY Blood Culture  adequate volume   Culture   Final    NO GROWTH 5 DAYS Performed at [facility], [address], [place], [place] [address]    Report Status 12/14/2017 FINAL  Final  MRSA PCR Screening     Status: None   Collection Time: 12/11/17  5:36 AM  Result Value Ref Range Status   MRSA by PCR NEGATIVE NEGATIVE Final    Comment:        The GeneXpert MRSA Assay (FDA approved for NASAL specimens only), is one component of a comprehensive MRSA colonization surveillance program. It is not intended to diagnose MRSA infection nor to guide or monitor treatment for MRSA infections. Performed at [facility], [place] [address], [place], [place] [address]     Radiology Reports Dg Chest Portable 1 View  Result Date: 12/09/2017 CLINICAL DATA:  Sepsis EXAM: PORTABLE CHEST 1 VIEW COMPARISON:  Chest x-rays dated 11/08/2017 and 11/05/2017. FINDINGS: Heart size and mediastinal contours are stable. Tracheostomy tube appears appropriately positioned in the midline. There is chronic central pulmonary vascular congestion. Linear opacity within the LEFT mid lung region is compatible with atelectasis. No new confluent opacity to suggest a developing pneumonia. No pleural effusion or pneumothorax seen. No acute or suspicious osseous finding. IMPRESSION: 1. No evidence of pneumonia or pulmonary edema. 2. Chronic mild central pulmonary vascular congestion. Electronically Signed   By: Franki Cabot M.D.   On: 12/09/2017 07:21    Lab Data:  CBC: Recent Labs  Lab 12/10/17 2313 12/11/17 0322 12/12/17 0425 12/13/17 0448 12/15/17 0725  WBC  --  23.3* 21.9* 21.8* 17.8*  HGB 9.9* 10.1* 9.6* 9.1* 9.1*  HCT 32.4* 33.4* 31.4* 29.1* 30.1*  MCV  --  96.5 96.9 93.6 96.5  PLT  --  243 238 189 272   Basic Metabolic Panel: Recent Labs  Lab 12/11/17 2255 12/12/17 0425 12/12/17 1141  12/13/17 0448  12/15/17 0457 12/15/17 1030 12/15/17 1645 12/15/17 2333 12/16/17 0532  NA 156* 156* 156*   < > 148*   < > 141  141 141 138 141  K 5.1 2.7* 2.9*  --  3.9  --  3.7  --   --   --  5.5*  CL 126* 129*  --   --  119*  --  117*  --   --   --  115*  CO2 17* 18*  --   --  15*  --  16*  --   --   --  15*  GLUCOSE 225* 177*  --   --  231*  --  283*  --   --   --  210*  BUN 133* 124*  --   --  98*  --  81*  --   --   --  75*  CREATININE 1.39* 1.35*  --   --  1.16*  --  1.02*  --   --   --  0.85  CALCIUM 8.7* 8.5*  --   --  8.3*  --  8.7*  --   --   --  8.7*  MG  --  2.3  --   --   --   --   --   --   --   --   --    < > = values in this interval not displayed.   GFR: Estimated Creatinine Clearance:  70.2 mL/min (by C-G formula based on SCr of 0.85 mg/dL). Liver Function Tests: Recent Labs  Lab 12/11/17 0322 12/12/17 0425 12/13/17 0448  AST 174* 166* 157*  ALT 106* 97* 91*  ALKPHOS 63 82 89  BILITOT 0.6 0.4 0.4  PROT 7.4 7.0 6.3*  ALBUMIN 1.5* 1.4* 1.4*   No results for input(s): LIPASE, AMYLASE in the last 168 hours. No results for input(s): AMMONIA in the last 168 hours. Coagulation Profile: No results for input(s): INR, PROTIME in the last 168 hours. Cardiac Enzymes: No results for input(s): CKTOTAL, CKMB, CKMBINDEX, TROPONINI in the last 168 hours. BNP (last 3 results) No results for input(s): PROBNP in the last 8760 hours. HbA1C: No results for input(s): HGBA1C in the last 72 hours. CBG: Recent Labs  Lab 12/16/17 0000 12/16/17 0418 12/16/17 0820 12/16/17 1138 12/16/17 2027  GLUCAP 210* 183* 200* 165* 74   Lipid Profile: No results for input(s): CHOL, HDL, LDLCALC, TRIG, CHOLHDL, LDLDIRECT in the last 72 hours. Thyroid Function Tests: No results for input(s): TSH, T4TOTAL, FREET4, T3FREE, THYROIDAB in the last 72 hours. Anemia Panel: No results for input(s): VITAMINB12, FOLATE, FERRITIN, TIBC, IRON, RETICCTPCT in the last 72 hours. Urine analysis:    Component Value Date/Time   COLORURINE AMBER (A) 12/09/2017 0758   APPEARANCEUR CLOUDY (A) 12/09/2017 0758   LABSPEC 1.016  12/09/2017 0758   PHURINE 9.0 (H) 12/09/2017 0758   GLUCOSEU 50 (A) 12/09/2017 0758   GLUCOSEU > 1000 mg/dL (A) 11/01/2007 2056   HGBUR NEGATIVE 12/09/2017 0758   HGBUR negative 07/15/2007 0954   BILIRUBINUR NEGATIVE 12/09/2017 0758   KETONESUR NEGATIVE 12/09/2017 0758   PROTEINUR >=300 (A) 12/09/2017 0758   UROBILINOGEN 1.0 04/09/2012 1524   NITRITE NEGATIVE 12/09/2017 0758   LEUKOCYTESUR LARGE (A) 12/09/2017 0758     [name] M.D. [facility] 12/17/2017, 10:27 AM  Pager: [phone] Between 7am to 7pm - call Pager - [phone]  After 7pm go to [email or web address] - password [email or web address]  Call night coverage person covering after 7pm

## 2017-12-17 NOTE — Progress Notes (Addendum)
[facility] Liaison: RN note.  Received request from [place] for family interest in [facility] with request for transfer as soon as possible. Chart reviewed and patient approved for admission. Spoke with daughter, [name] by telephone to confirm interest and explain services.  [facility] is currently waiting on daughter, [name] to complete paper work for [employer] admission. [place] will up date CSW when paperwork has been completed to initiate transfer.  Please do not hesitate to call with questions. Thank you.   Farrel Gordon, RN, [facility] Liaison  [place] are on AMION

## 2017-12-17 NOTE — Progress Notes (Signed)
Nutrition Follow Up/Brief Note  Chart reviewed. Pt now transitioned to comfort care.  TF (Jevity 1.2 formula) discontinued via PEG tube 6/9. No further nutrition interventions warranted at this time.  Please re-consult as needed.   Arthur Holms, RD, LDN Pager #: [phone] After-Hours Pager #: [phone]

## 2017-12-17 NOTE — Progress Notes (Signed)
[facility] Liaison: RN note.  Received request from [name], [place] for family interest in [facility]. Chart reviewed and spoke with daughter, [name]  to acknowledge referral.  Unfortunately [place] is not able to offer a room today. Family and CSW are aware [facility] liaison will follow up with CSW and family tomorrow or sooner if room becomes available. Please do not hesitate to call with questions. Thank you.   [name] Gordon, RN, [facility] Liaison  [place] are on AMION

## 2017-12-18 NOTE — Progress Notes (Signed)
[facility] Liaison: RN note.  [facility] liaison has been unable to contact daughter [name]  today to complete paperwork needed for patient to transfer to [facility].  Tiffany has contact information to return call to liaison and stated she received e-mail with necessary forms on 12/17/2017.  Rhea Pink, CSW is aware.   [facility] will continue to be available to assist with this process.   Please do not hesitate to call with questions. Thank you.   Farrel Gordon, RN, [place], [facility] Liaison  [place] are on AMION

## 2017-12-18 NOTE — Progress Notes (Signed)
[facility]AR here to get patient to take to [facility]. Trach care and suction done prior to leaving.  Emelda Fear, RN

## 2017-12-18 NOTE — Progress Notes (Signed)
[facility] Liaison RN note.  Laurene Footman, RN with [place] spoke with daughter [name]. Tiffany is aware patient will be transferring to [facility] today.   Thank you.   Farrel Gordon, RN, [facility] Liaison  [place] are on AMION

## 2017-12-18 NOTE — Progress Notes (Signed)
[facility]                                                                              Patient Demographics  [name], is a 62 y.o. female, DOB - [birth year], ZOX:[id number]  Admit date - 12/09/2017   Admitting Physician Lady Deutscher, MD  Outpatient Primary MD for the patient is Nolene Ebbs, MD  Outpatient specialists:   LOS - 9  days   Medical records reviewed and are as summarized below:    Chief Complaint  Patient presents with  . Fever       Brief summary   Patient is a 62 year old female with a history of watershed infarcts, status epilepticus, hypoxic respiratory failure, status post trach, PEG tube, polysubstance abuse (cocaine, tobacco, alcohol), hypertension, hyperlipidemia, uncontrolled diabetes, RUE DVT, multiple hospitalization, has been in a persistent vegetative state was brought from her skilled nursing facility with a fever of 103 F, found to have sepsis presumed secondary to sacral decubitus ulcer, right lower leg ulcer.  Patient was also seen in the hospital 4/29-5/10 for anemia requiring blood transfusions after previous episode of hemorrhagic shock related to abdominal wall hematoma   Assessment & Plan      Sepsis [facility]) with Proteus/Enterobacter bacteremia, presumed secondary to sacral decub ulcer, right lower extremity ulcer present prior to admission -Patient was placed on IV vancomycin and cefepime on 6/6, due to persistent fevers. -No meaningful recovery in the last few days, goals of care were discussed with the patient's family on 6/9 by palliative medicine.  Patient's family has requested comfort care and hospice. - Patient was discharged on 12/17/2017 to [facility] where she was accepted however they have not received signed paperwork from the family hence discharge was held. -DC summary from 6/10 still stands, no changes    Type II diabetes mellitus with renal manifestations (HCC) -Hemoglobin A1c 8.2, poorly  controlled, continue comfort care  Hyperkalemia -Comfort care  Chronic respiratory failure with tracheostomy status ([place]) On trach, chest x-ray showed no pneumonia.    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  DC summary from 6/10 stillstands, no changes. Time Spent in minutes 15 minutes  Procedures:  None  Consultants:   Palliative med  Antimicrobials:   IV vancomycin 6/6  IV cefepime 6/6   Medications  Scheduled Meds: . acetaminophen (TYLENOL) oral liquid 160 mg/5 mL  650 mg Oral Q8H  . labetalol  400 mg Per Tube TID  . lacosamide  200 mg Oral BID  . levETIRAcetam  1,000 mg Per Tube  BID  . phenytoin  250 mg Per Tube BID  . scopolamine  1 patch Transdermal Q72H   Continuous Infusions:  PRN Meds:.acetaminophen, albuterol, bisacodyl, docusate, LORazepam, morphine CONCENTRATE, ondansetron **OR** ondansetron (ZOFRAN) IV, senna-docusate   Antibiotics   Anti-infectives (From admission, onward)   Start     Dose/Rate Route Frequency Ordered Stop   12/14/17 0130  vancomycin (VANCOCIN) IVPB 750 mg/150 ml premix  Status:  Discontinued     750 mg 150 mL/hr over 60 Minutes Intravenous Every 12 hours 12/13/17 1325 12/16/17 1227   12/13/17 1600  ceFEPIme (MAXIPIME) 1 g in  sodium chloride 0.9 % 100 mL IVPB  Status:  Discontinued     1 g 200 mL/hr over 30 Minutes Intravenous Every 8 hours 12/13/17 1325 12/16/17 1227   12/13/17 1330  vancomycin (VANCOCIN) 1,250 mg in sodium chloride 0.9 % 250 mL IVPB     1,250 mg 166.7 mL/hr over 90 Minutes Intravenous  Once 12/13/17 1325 12/13/17 1634   12/10/17 1200  cefTRIAXone (ROCEPHIN) 2 g in sodium chloride 0.9 % 100 mL IVPB  Status:  Discontinued     2 g 200 mL/hr over 30 Minutes Intravenous Every 24 hours 12/10/17 0946 12/13/17 1325   12/10/17 0900  vancomycin (VANCOCIN) IVPB 1000 mg/200 mL premix  Status:  Discontinued     1,000 mg 200 mL/hr over 60 Minutes Intravenous Every 24 hours 12/09/17 0816 12/10/17 0946   12/09/17 1400  piperacillin-tazobactam (ZOSYN) IVPB 3.375 g  Status:  Discontinued     3.375 g 12.5 mL/hr over 240 Minutes Intravenous Every 8 hours 12/09/17 0816 12/10/17 0946   12/09/17 0715  piperacillin-tazobactam (ZOSYN) IVPB 3.375 g     3.375 g 100 mL/hr over 30 Minutes Intravenous  Once 12/09/17 0704 12/09/17 0912   12/09/17 0715  vancomycin (VANCOCIN) IVPB 1000 mg/200 mL premix  Status:  Discontinued     1,000 mg 200 mL/hr over 60 Minutes Intravenous  Once 12/09/17 0704 12/09/17 0706   12/09/17 0715  vancomycin (VANCOCIN) 1,500 mg in sodium chloride 0.9 % 500 mL IVPB     1,500 mg 250 mL/hr over 120 Minutes Intravenous  Once 12/09/17 0706 12/09/17 1215        Subjective:   [name] was seen and examined today.  Alert and awake, nonverbal, does not follow commands.  Comfort care.   Objective:   Vitals:   12/18/17 0328 12/18/17 0555 12/18/17 0854 12/18/17 1009  BP:  130/78  (!) 159/75  Pulse: 87 97 92 91  Resp: (!) 28 20 18    Temp: 97.7 F (36.5 C) 97.9 F (36.6 C)    TempSrc:      SpO2: 99% 97% 100%   Weight:      Height:        Intake/Output Summary (Last 24 hours) at 12/18/2017 1105 Last data filed at 12/18/2017 0500 Gross per 24 hour  Intake 0 ml  Output 1750 ml  Net  -1750 ml     Wt Readings from Last 3 Encounters:  11/16/17 83.6 kg (184 lb 4.9 oz)  10/18/17 80.3 kg (177 lb)  09/14/17 82.1 kg (181 lb)     Exam   General: Awake, nonverbal, does not follow commands  Cardiovascular: RRR   respiratory: Bilateral coarse rhonchi  Gastrointestinal: Soft, nontender, nondistended, + bowel sounds, PEG tube  Ext: b/l LE in heel protectors  Neuro: does not follow commands  Musculoskeletal: No digital cyanosis, clubbing  Skin: No rashes  Psych: nonverbal  Data Reviewed:  I have personally reviewed following labs and imaging studies  Micro Results Recent Results (from the past 240 hour(s))  Culture, blood (Routine x 2)     Status: Abnormal   Collection Time: 12/09/17  7:00 AM  Result Value Ref Range Status   Specimen Description BLOOD RIGHT HAND  Final   Special Requests   Final    BOTTLES DRAWN AEROBIC AND ANAEROBIC Blood Culture adequate volume   Culture  Setup Time   Final    GRAM NEGATIVE RODS AEROBIC BOTTLE ONLY CRITICAL RESULT CALLED TO, READ BACK BY AND VERIFIED WITH: E MARTIN,PHARMD AT 7782 12/10/17 BY L BENFIELD Performed at [facility], [address], [place], [place] [address]    Culture PROTEUS MIRABILIS (A)  Final   Report Status 12/12/2017 FINAL  Final   Organism ID, Bacteria PROTEUS MIRABILIS  Final      Susceptibility   Proteus mirabilis - MIC*    AMPICILLIN <=2 SENSITIVE Sensitive     CEFAZOLIN <=4 SENSITIVE Sensitive     CEFEPIME <=1 SENSITIVE Sensitive     CEFTAZIDIME <=1 SENSITIVE Sensitive     CEFTRIAXONE <=1 SENSITIVE Sensitive     CIPROFLOXACIN >=4 RESISTANT Resistant     GENTAMICIN <=1 SENSITIVE Sensitive     IMIPENEM 2 SENSITIVE Sensitive     TRIMETH/SULFA <=20 SENSITIVE Sensitive     AMPICILLIN/SULBACTAM <=2 SENSITIVE Sensitive     PIP/TAZO <=4 SENSITIVE Sensitive     * PROTEUS MIRABILIS  Blood Culture ID Panel (Reflexed)     Status: Abnormal   Collection Time: 12/09/17  7:00 AM  Result  Value Ref Range Status   Enterococcus species NOT DETECTED NOT DETECTED Final   Listeria monocytogenes NOT DETECTED NOT DETECTED Final   Staphylococcus species NOT DETECTED NOT DETECTED Final   Staphylococcus aureus NOT DETECTED NOT DETECTED Final   Streptococcus species NOT DETECTED NOT DETECTED Final   Streptococcus agalactiae NOT DETECTED NOT DETECTED Final   Streptococcus pneumoniae NOT DETECTED NOT DETECTED Final   Streptococcus pyogenes NOT DETECTED NOT DETECTED Final   Acinetobacter baumannii NOT DETECTED NOT DETECTED Final   Enterobacteriaceae species DETECTED (A) NOT DETECTED Final    Comment: Enterobacteriaceae represent a large family of gram-negative bacteria, not a single organism. CRITICAL RESULT CALLED TO, READ BACK BY AND VERIFIED WITH: E MARTIN,PHARMD AT 6144 12/10/17 BY L BENFIELD    Enterobacter cloacae complex NOT DETECTED NOT DETECTED Final   Escherichia coli NOT DETECTED NOT DETECTED Final   Klebsiella oxytoca NOT DETECTED NOT DETECTED Final   Klebsiella pneumoniae NOT DETECTED NOT DETECTED Final   Proteus species DETECTED (A) NOT DETECTED Final    Comment: CRITICAL RESULT CALLED TO, READ BACK BY AND VERIFIED WITH: E MARTIN,PHARMD AT 3154 12/10/17 BY L BENFIELD    Serratia marcescens NOT DETECTED NOT DETECTED Final   Carbapenem resistance NOT DETECTED NOT DETECTED Final   Haemophilus influenzae NOT DETECTED NOT DETECTED Final   Neisseria meningitidis NOT DETECTED NOT DETECTED Final   Pseudomonas aeruginosa NOT DETECTED NOT DETECTED Final   Candida albicans NOT DETECTED NOT DETECTED Final   Candida glabrata NOT DETECTED NOT DETECTED Final   Candida krusei NOT DETECTED NOT DETECTED Final   Candida parapsilosis NOT DETECTED NOT DETECTED Final   Candida tropicalis NOT DETECTED NOT DETECTED Final    Comment: Performed at [facility], [place] [address] [place], [place] [address]  Culture, blood (Routine x 2)     Status: None   Collection  Time: 12/09/17  6:58 PM    Result Value Ref Range Status   Specimen Description BLOOD BLOOD LEFT WRIST  Final   Special Requests   Final    BOTTLES DRAWN AEROBIC ONLY Blood Culture adequate volume   Culture   Final    NO GROWTH 5 DAYS Performed at [facility], [address], [place], [place] [address]    Report Status 12/14/2017 FINAL  Final  MRSA PCR Screening     Status: None   Collection Time: 12/11/17  5:36 AM  Result Value Ref Range Status   MRSA by PCR NEGATIVE NEGATIVE Final    Comment:        The GeneXpert MRSA Assay (FDA approved for NASAL specimens only), is one component of a comprehensive MRSA colonization surveillance program. It is not intended to diagnose MRSA infection nor to guide or monitor treatment for MRSA infections. Performed at [facility], [place] [address] [place], [place] [address]     Radiology Reports Dg Chest Portable 1 View  Result Date: 12/09/2017 CLINICAL DATA:  Sepsis EXAM: PORTABLE CHEST 1 VIEW COMPARISON:  Chest x-rays dated 11/08/2017 and 11/05/2017. FINDINGS: Heart size and mediastinal contours are stable. Tracheostomy tube appears appropriately positioned in the midline. There is chronic central pulmonary vascular congestion. Linear opacity within the LEFT mid lung region is compatible with atelectasis. No new confluent opacity to suggest a developing pneumonia. No pleural effusion or pneumothorax seen. No acute or suspicious osseous finding. IMPRESSION: 1. No evidence of pneumonia or pulmonary edema. 2. Chronic mild central pulmonary vascular congestion. Electronically Signed   By: Franki Cabot M.D.   On: 12/09/2017 07:21    Lab Data:  CBC: Recent Labs  Lab 12/12/17 0425 12/13/17 0448 12/15/17 0725  WBC 21.9* 21.8* 17.8*  HGB 9.6* 9.1* 9.1*  HCT 31.4* 29.1* 30.1*  MCV 96.9 93.6 96.5  PLT 238 189 073   Basic Metabolic Panel: Recent Labs  Lab 12/11/17 2255 12/12/17 0425 12/12/17 1141  12/13/17 0448  12/15/17 0457 12/15/17 1030  12/15/17 1645 12/15/17 2333 12/16/17 0532  NA 156* 156* 156*   < > 148*   < > 141 141 141 138 141  K 5.1 2.7* 2.9*  --  3.9  --  3.7  --   --   --  5.5*  CL 126* 129*  --   --  119*  --  117*  --   --   --  115*  CO2 17* 18*  --   --  15*  --  16*  --   --   --  15*  GLUCOSE 225* 177*  --   --  231*  --  283*  --   --   --  210*  BUN 133* 124*  --   --  98*  --  81*  --   --   --  75*  CREATININE 1.39* 1.35*  --   --  1.16*  --  1.02*  --   --   --  0.85  CALCIUM 8.7* 8.5*  --   --  8.3*  --  8.7*  --   --   --  8.7*  MG  --  2.3  --   --   --   --   --   --   --   --   --    < > = values in this interval not displayed.   GFR: Estimated Creatinine  Clearance: 70.2 mL/min (by C-G formula based on SCr of 0.85 mg/dL). Liver Function Tests: Recent Labs  Lab 12/12/17 0425 12/13/17 0448  AST 166* 157*  ALT 97* 91*  ALKPHOS 82 89  BILITOT 0.4 0.4  PROT 7.0 6.3*  ALBUMIN 1.4* 1.4*   No results for input(s): LIPASE, AMYLASE in the last 168 hours. No results for input(s): AMMONIA in the last 168 hours. Coagulation Profile: No results for input(s): INR, PROTIME in the last 168 hours. Cardiac Enzymes: No results for input(s): CKTOTAL, CKMB, CKMBINDEX, TROPONINI in the last 168 hours. BNP (last 3 results) No results for input(s): PROBNP in the last 8760 hours. HbA1C: No results for input(s): HGBA1C in the last 72 hours. CBG: Recent Labs  Lab 12/16/17 0000 12/16/17 0418 12/16/17 0820 12/16/17 1138 12/16/17 2027  GLUCAP 210* 183* 200* 165* 74   Lipid Profile: No results for input(s): CHOL, HDL, LDLCALC, TRIG, CHOLHDL, LDLDIRECT in the last 72 hours. Thyroid Function Tests: No results for input(s): TSH, T4TOTAL, FREET4, T3FREE, THYROIDAB in the last 72 hours. Anemia Panel: No results for input(s): VITAMINB12, FOLATE, FERRITIN, TIBC, IRON, RETICCTPCT in the last 72 hours. Urine analysis:    Component Value Date/Time   COLORURINE AMBER (A) 12/09/2017 0758   APPEARANCEUR CLOUDY  (A) 12/09/2017 0758   LABSPEC 1.016 12/09/2017 0758   PHURINE 9.0 (H) 12/09/2017 0758   GLUCOSEU 50 (A) 12/09/2017 0758   GLUCOSEU > 1000 mg/dL (A) 11/01/2007 2056   HGBUR NEGATIVE 12/09/2017 0758   HGBUR negative 07/15/2007 0954   BILIRUBINUR NEGATIVE 12/09/2017 0758   KETONESUR NEGATIVE 12/09/2017 0758   PROTEINUR >=300 (A) 12/09/2017 0758   UROBILINOGEN 1.0 04/09/2012 1524   NITRITE NEGATIVE 12/09/2017 0758   LEUKOCYTESUR LARGE (A) 12/09/2017 0758     Ripudeep Rai M.D. [facility] 12/18/2017, 11:05 AM  Pager: [phone] Between 7am to 7pm - call Pager - [phone]  After 7pm go to [email or web address] - password [email or web address]  Call night coverage person covering after 7pm

## 2017-12-18 NOTE — Progress Notes (Signed)
Clinical Social Worker facilitated patient discharge.Clinical information faxed to facility and family agreeable with plan. Surveyor, quantity of Social Work filled out paperwork to be able to transition patient to [facility]. CSW was still unable to reach patients daughter today but when speaking to daughter yesterday she was agreeable to discharge plan. CSW arranged ambulance transport via [facility] to [facility] .  RN to call [phone] for report prior to discharge.  Clinical Social Worker will sign off for now as social work intervention is no longer needed. Please consult [place] again if new need arises.  Rhea Pink, MSW, [place]

## 2017-12-18 NOTE — Care Management Note (Signed)
Case Management Note Marvetta Gibbons RN, BSN Unit 4E-Case Manager [phone]  Patient Details  Name: [name] MRN: [id number] Date of Birth: 1956/02/26  Subjective/Objective:     Pt admitted with sepsis, large IV sacral decubitus ulcer             Action/Plan: PTA pt was at [facility], hx of persistent vegetative state, trach and peg. Anticipate return to SNF. CSW to follow for transition of care needs- MD/staff unable to reach mother on admission.   Expected Discharge Date:  12/17/17               Expected Discharge Plan:  Skilled Nursing Facility  In-House Referral:  Clinical Social Work  Discharge planning Services  CM Consult  Post Acute Care Choice:  NA Choice offered to:  NA  DME Arranged:    DME Agency:     HH Arranged:    [place] Agency:     Status of Service:  Completed, signed off  If discussed at [employer] of Stay Meetings, dates discussed:    Discharge Disposition: hospice facility   Additional Comments:  12/17/17- 1600- Marvetta Gibbons RN, CM- pt has been made comfort care per daughter [name] wishes as discussed with PC team. CSW following for transition to residential [place] once daughter has completed paperwork.   [name] Patricia, RN 12/18/2017, 12:36 PM

## 2017-12-18 NOTE — Progress Notes (Signed)
Spoke with Derrel Nip, RN at [facility] to give report. Left this RN's name and phone number should she have any other questions.   Emelda Fear, RN

## 2017-12-18 NOTE — Progress Notes (Signed)
Clinical Social Worker following patient for support and discharge needs. CSW is assisting patient in transitioning to [facility]. Patient was suppose to discharge yesterday once patients daughter [name] finished paperwork and faxed it back to [facility]).  Per Bevely Palmer daughter [name] has not faxed over paperwork and has now stopped responding to phone calls. CSW called [name] [phone]) several times and left voicemail but CSW has not received a phone call back from daughter.   CSW reached out to social work Surveyor, quantity to get some assistance with patients care. Surveyor, quantity stated he will be able to sign patient into [facility] as long as facility allows it. CSW reached out to Bevely Palmer to make her aware. CSW awaiting from [facility] to see if they can allow Director to sign patient in.   Rhea Pink, MSW,  [place]

## 2017-12-18 NOTE — Progress Notes (Signed)
[facility] Liaison RN note.  Received request from [name] for family interest in [name] with request for transfer today. Chart reviewed and patient approved for admission to [facility]. Spoke with daughter [name] by phone on 12/17/2017 to confirm interest and explain services. [name] is agreeable to transfer. CSW aware.  Registration paper work completed by hospital representative, [name], CSW due to daughter's unavailability to complete the paperwork. Dr. Orpah [name] to assume care per [name]'s request.  Please fax discharge summary to [phone]. RN please call report to [phone]. Please arrange transport for patient to arrive before noon if possible.  Thank you.   [name] Gordon, RN, [facility] Liaison  [place] are on AMION

## 2018-01-07 DEATH — deceased

## 2019-01-27 IMAGING — DX DG CHEST 1V PORT
1 series · 1 of 1 positions shown · non-contrast
Comparison: August 03, 2017

CLINICAL DATA: Patient ventilated.

EXAM:
PORTABLE CHEST 1 VIEW

[chest ap]
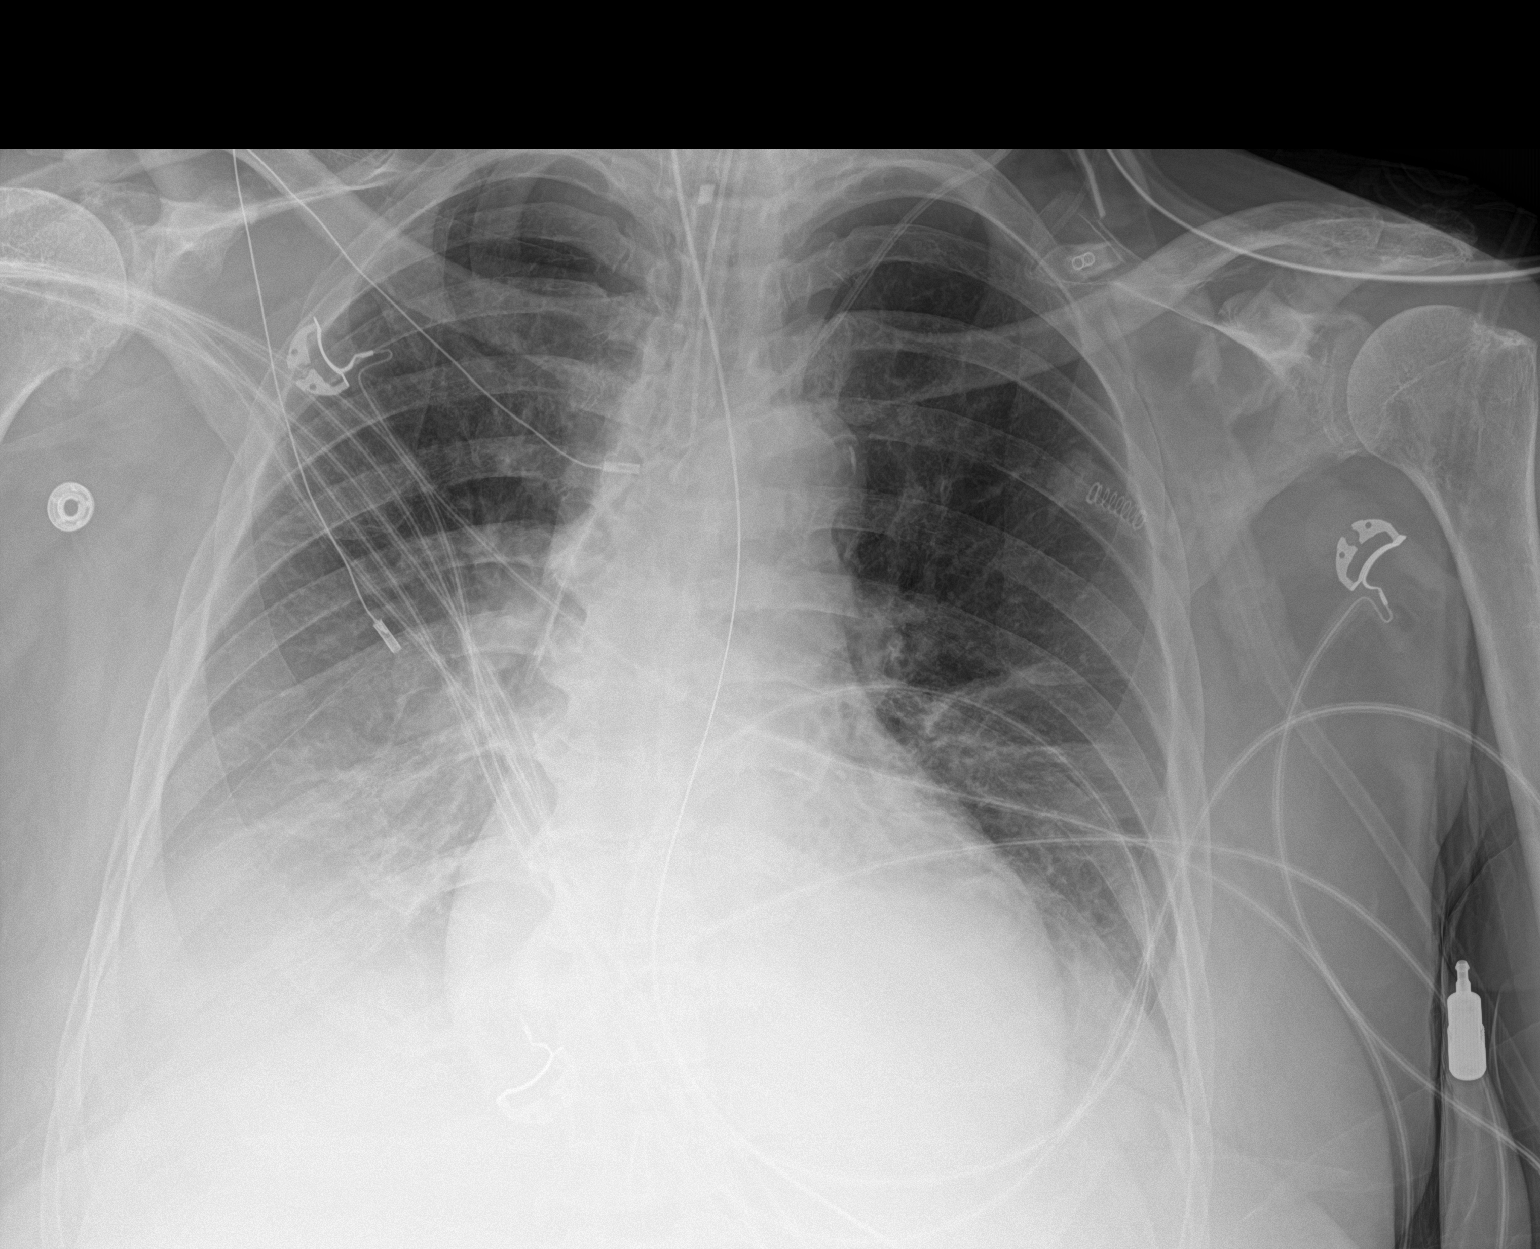

[1 of 1 positions shown; findings below may reference images not displayed]

FINDINGS: Stable cardiomegaly and cardiomediastinal silhouette. The ET tube
and left central line are in good position. The NG tube terminates
below today's film. No pneumothorax. A right-sided pleural effusion
is larger in the interval with underlying opacity. There is probably
a small layering effusion on the left, not seen previously. The
opacity in the left base is also increased. No other interval
changes.
IMPRESSION: 1. Support apparatus as above.
2. Bilateral pleural effusions, right greater than left, worsened in
the interval. Opacities underlying the effusions are more prominent
as well.

## 2019-01-28 IMAGING — DX DG CHEST 1V PORT
1 series · 1 of 1 positions shown · non-contrast
Comparison: 08/04/2017

CLINICAL DATA: Respiratory failure

EXAM:
PORTABLE CHEST 1 VIEW

[chest ap]
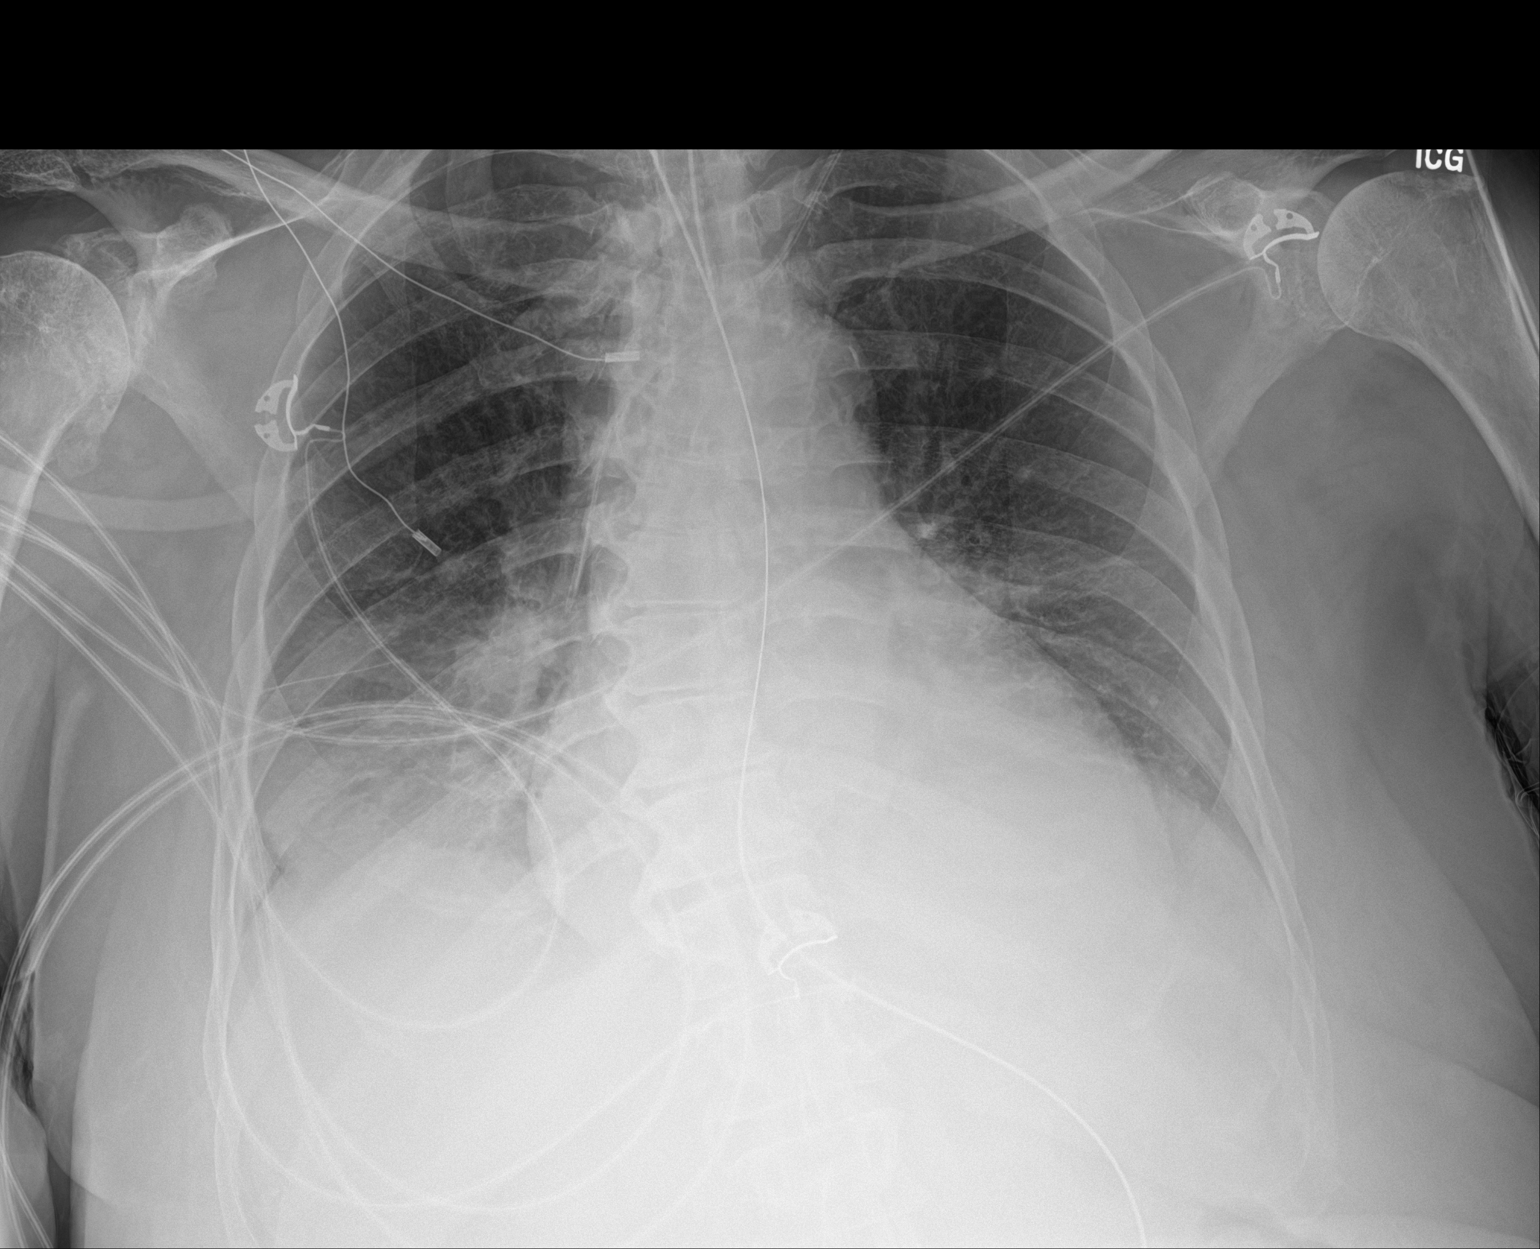

[1 of 1 positions shown; findings below may reference images not displayed]

FINDINGS: Endotracheal tube, nasogastric catheter and left central line are
again noted and stable. Cardiac shadow remains enlarged. Some mild
improved aeration is noted in the right base although persistent
infiltrate is noted. No acute bony abnormality is noted. Mild left
basilar atelectasis remains.
IMPRESSION: Slight improved aeration in the right lung base.

## 2019-01-29 IMAGING — DX DG CHEST 1V PORT
1 series · 1 of 1 positions shown · non-contrast
Comparison: 08/05/2017

CLINICAL DATA: Intubated.

EXAM:
PORTABLE CHEST 1 VIEW

[chest ap]
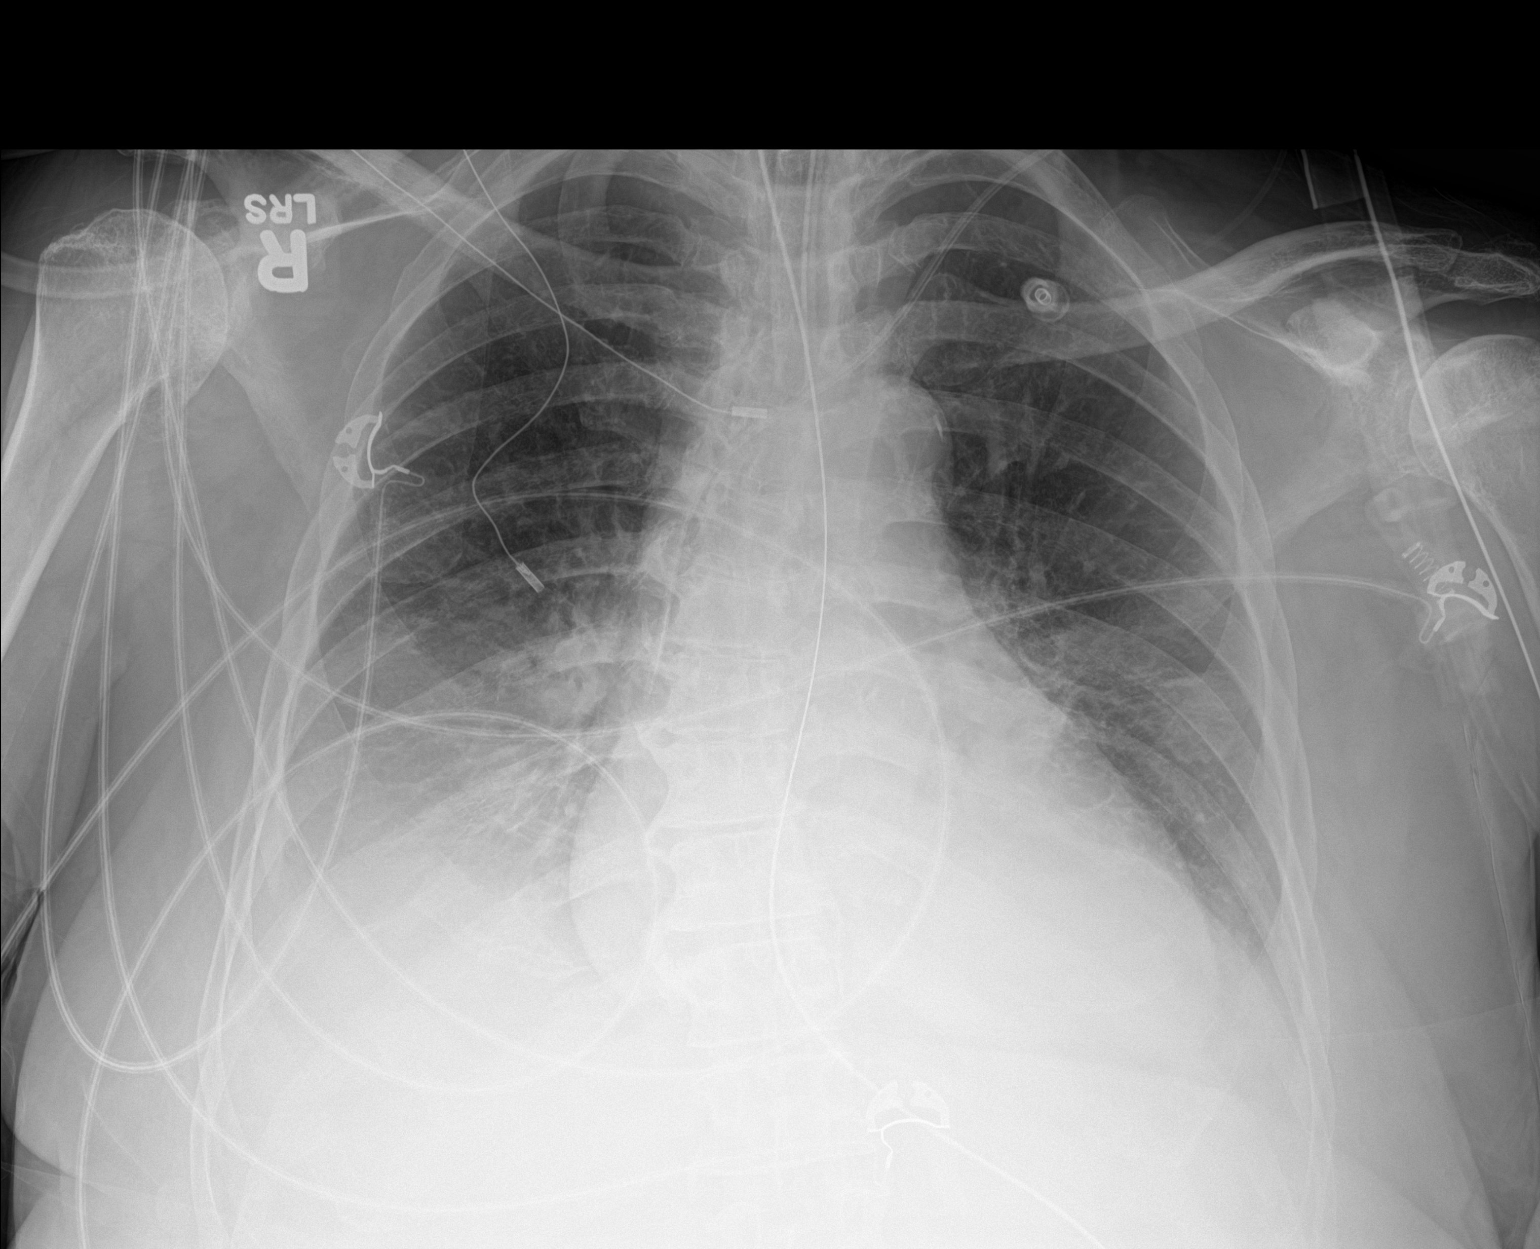

[1 of 1 positions shown; findings below may reference images not displayed]

FINDINGS: Endotracheal tube terminates just below the clavicular heads, well
above the carina. Left jugular catheter terminates over the lower
SVC tube courses into the left upper abdomen with tip not imaged.
The cardiac silhouette remains mildly enlarged. Aortic
atherosclerosis is noted. Partially veiling opacities in the right
greater than lung bases are unchanged and likely represent a
combination of small pleural effusions and airspace opacity. No
pneumothorax is identified.
IMPRESSION: Unchanged bibasilar airspace disease and pleural effusions, right
larger than left.

## 2019-01-30 IMAGING — DX DG CHEST 1V PORT
1 series · 1 of 1 positions shown · non-contrast
Comparison: Yesterday

CLINICAL DATA: Endotracheal tube

EXAM:
PORTABLE CHEST 1 VIEW

[chest ap]
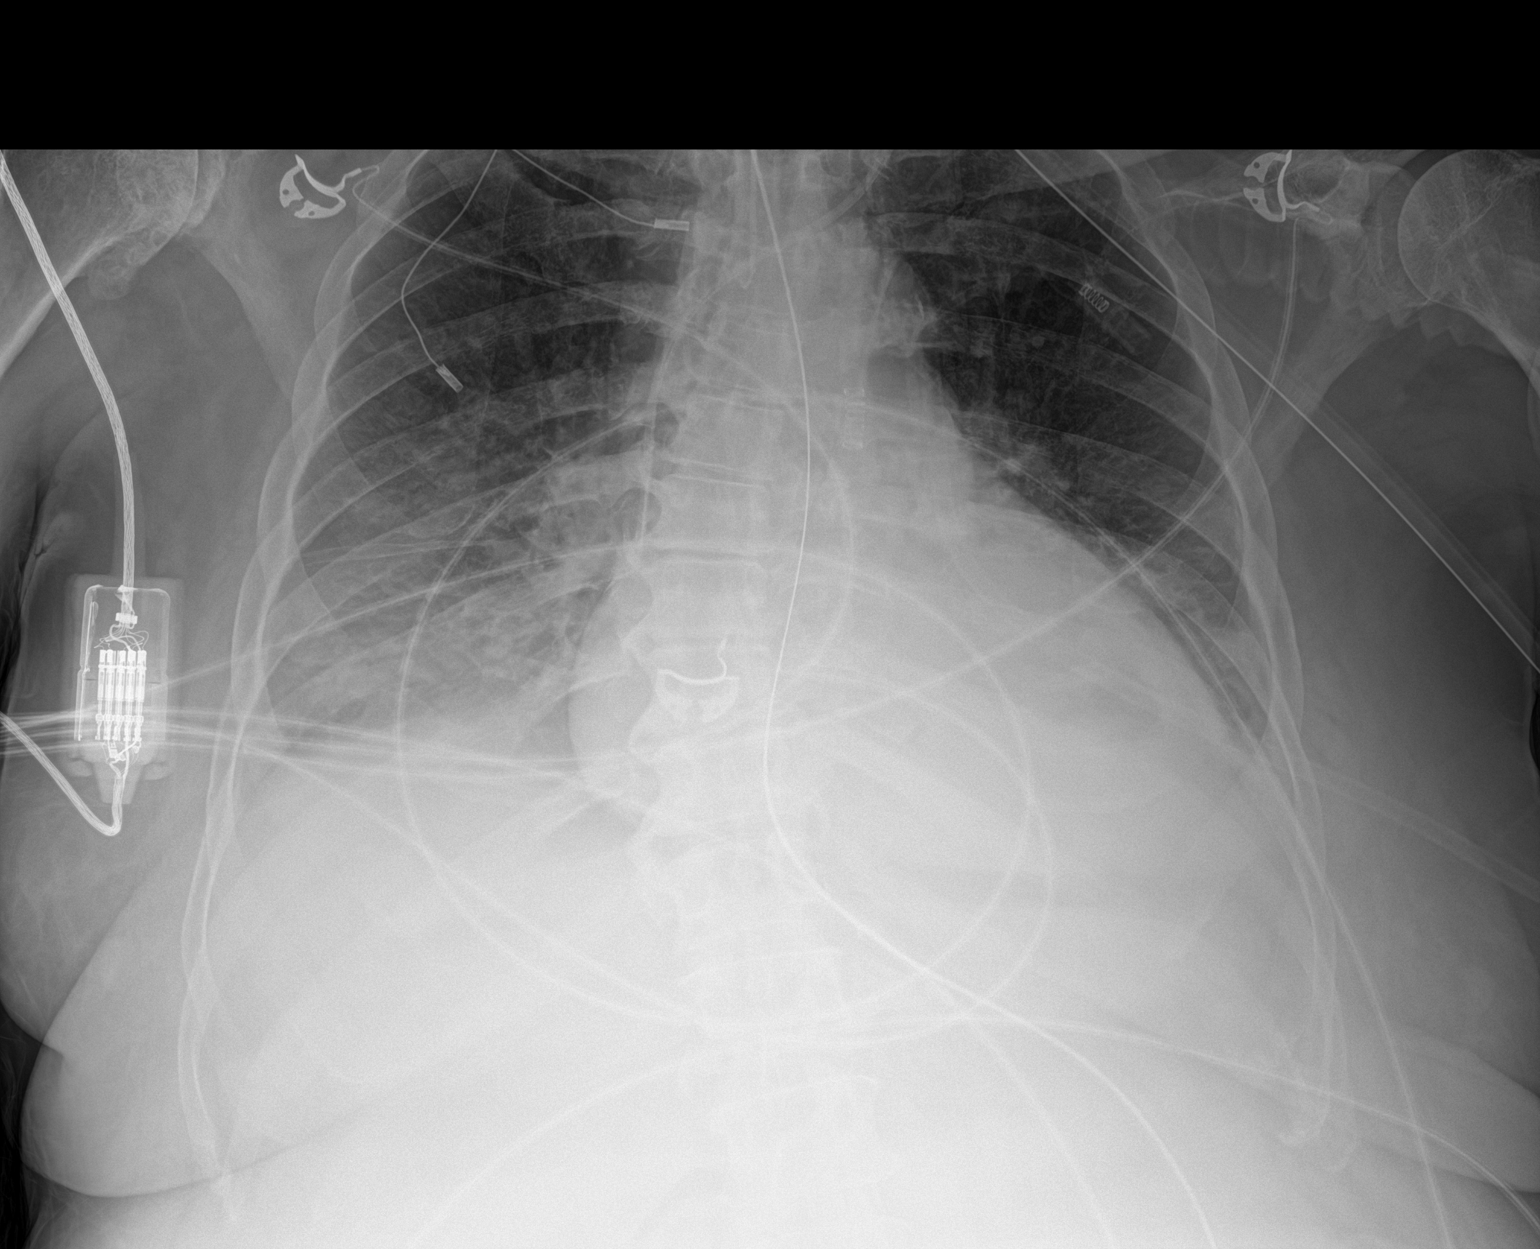

[1 of 1 positions shown; findings below may reference images not displayed]

FINDINGS: Endotracheal tube tip between the clavicular heads and carina. Left
IJ central line with tip at the upper cavoatrial junction. An
orogastric tube at least reaches the stomach

Mild cardiopericardial enlargement, stable.

Hazy lower chest opacities are also stable. No Kerley lines or
pneumothorax.
IMPRESSION: 1. Unchanged positioning of tubes and central line.
2. Unchanged hazy opacities at the bases which could reflect
atelectasis, small pleural effusion, and/or infection.
